# Patient Record
Sex: Male | Born: 1950
Health system: Southern US, Community
[De-identification: ages and names within clinical notes are randomized; demographics above are authoritative.]

## PROBLEM LIST (undated history)

## (undated) DIAGNOSIS — I639 Cerebral infarction, unspecified: Secondary | ICD-10-CM

## (undated) DIAGNOSIS — E785 Hyperlipidemia, unspecified: Secondary | ICD-10-CM

## (undated) DIAGNOSIS — R7401 Elevation of levels of liver transaminase levels: Secondary | ICD-10-CM

## (undated) DIAGNOSIS — R011 Cardiac murmur, unspecified: Secondary | ICD-10-CM

## (undated) DIAGNOSIS — R0600 Dyspnea, unspecified: Secondary | ICD-10-CM

## (undated) DIAGNOSIS — D229 Melanocytic nevi, unspecified: Secondary | ICD-10-CM

## (undated) DIAGNOSIS — I10 Essential (primary) hypertension: Secondary | ICD-10-CM

## (undated) DIAGNOSIS — M1710 Unilateral primary osteoarthritis, unspecified knee: Secondary | ICD-10-CM

## (undated) DIAGNOSIS — I7774 Dissection of vertebral artery: Secondary | ICD-10-CM

## (undated) DIAGNOSIS — F329 Major depressive disorder, single episode, unspecified: Secondary | ICD-10-CM

## (undated) DIAGNOSIS — M179 Osteoarthritis of knee, unspecified: Secondary | ICD-10-CM

## (undated) DIAGNOSIS — N200 Calculus of kidney: Secondary | ICD-10-CM

## (undated) DIAGNOSIS — F32A Depression, unspecified: Secondary | ICD-10-CM

## (undated) DIAGNOSIS — G4730 Sleep apnea, unspecified: Secondary | ICD-10-CM

## (undated) DIAGNOSIS — I4891 Unspecified atrial fibrillation: Secondary | ICD-10-CM

## (undated) DIAGNOSIS — I350 Nonrheumatic aortic (valve) stenosis: Secondary | ICD-10-CM

## (undated) DIAGNOSIS — R740 Nonspecific elevation of levels of transaminase and lactic acid dehydrogenase [LDH]: Secondary | ICD-10-CM

## (undated) DIAGNOSIS — I5031 Acute diastolic (congestive) heart failure: Secondary | ICD-10-CM

## (undated) DIAGNOSIS — Z87442 Personal history of urinary calculi: Secondary | ICD-10-CM

## (undated) DIAGNOSIS — K7460 Unspecified cirrhosis of liver: Secondary | ICD-10-CM

## (undated) DIAGNOSIS — N179 Acute kidney failure, unspecified: Secondary | ICD-10-CM

## (undated) DIAGNOSIS — K219 Gastro-esophageal reflux disease without esophagitis: Secondary | ICD-10-CM

## (undated) DIAGNOSIS — I2510 Atherosclerotic heart disease of native coronary artery without angina pectoris: Secondary | ICD-10-CM

## (undated) HISTORY — DX: Sleep apnea, unspecified: G47.30

## (undated) HISTORY — DX: Dissection of vertebral artery: I77.74

## (undated) HISTORY — DX: Nonspecific elevation of levels of transaminase and lactic acid dehydrogenase (ldh): R74.0

## (undated) HISTORY — DX: Osteoarthritis of knee, unspecified: M17.9

## (undated) HISTORY — DX: Gastro-esophageal reflux disease without esophagitis: K21.9

## (undated) HISTORY — DX: Melanocytic nevi, unspecified: D22.9

## (undated) HISTORY — DX: Essential (primary) hypertension: I10

## (undated) HISTORY — DX: Elevation of levels of liver transaminase levels: R74.01

## (undated) HISTORY — DX: Unilateral primary osteoarthritis, unspecified knee: M17.10

## (undated) HISTORY — DX: Hyperlipidemia, unspecified: E78.5

## (undated) HISTORY — DX: Unspecified cirrhosis of liver: K74.60

## (undated) HISTORY — DX: Acute kidney failure, unspecified: N17.9

## (undated) HISTORY — DX: Calculus of kidney: N20.0

---

## 2003-03-18 ENCOUNTER — Emergency Department (HOSPITAL_COMMUNITY): Admission: EM | Admit: 2003-03-18 | Discharge: 2003-03-18 | Payer: Self-pay | Admitting: *Deleted

## 2005-01-16 ENCOUNTER — Ambulatory Visit: Payer: Self-pay | Admitting: Internal Medicine

## 2005-01-17 ENCOUNTER — Ambulatory Visit (HOSPITAL_COMMUNITY): Admission: RE | Admit: 2005-01-17 | Discharge: 2005-01-17 | Payer: Self-pay | Admitting: Internal Medicine

## 2005-03-13 DIAGNOSIS — I639 Cerebral infarction, unspecified: Secondary | ICD-10-CM

## 2005-03-13 DIAGNOSIS — I7774 Dissection of vertebral artery: Secondary | ICD-10-CM

## 2005-03-13 HISTORY — DX: Cerebral infarction, unspecified: I63.9

## 2005-03-13 HISTORY — DX: Dissection of vertebral artery: I77.74

## 2005-05-12 ENCOUNTER — Ambulatory Visit (HOSPITAL_BASED_OUTPATIENT_CLINIC_OR_DEPARTMENT_OTHER): Admission: RE | Admit: 2005-05-12 | Discharge: 2005-05-12 | Payer: Self-pay | Admitting: Orthopedic Surgery

## 2005-05-12 HISTORY — PX: KNEE ARTHROSCOPY W/ PARTIAL MEDIAL MENISCECTOMY: SHX1882

## 2005-08-02 DIAGNOSIS — I7774 Dissection of vertebral artery: Secondary | ICD-10-CM | POA: Insufficient documentation

## 2005-12-27 ENCOUNTER — Inpatient Hospital Stay (HOSPITAL_COMMUNITY): Admission: EM | Admit: 2005-12-27 | Discharge: 2006-01-03 | Payer: Self-pay | Admitting: Emergency Medicine

## 2005-12-28 ENCOUNTER — Encounter (INDEPENDENT_AMBULATORY_CARE_PROVIDER_SITE_OTHER): Payer: Self-pay | Admitting: Neurology

## 2005-12-28 ENCOUNTER — Encounter (INDEPENDENT_AMBULATORY_CARE_PROVIDER_SITE_OTHER): Payer: Self-pay | Admitting: *Deleted

## 2005-12-28 ENCOUNTER — Ambulatory Visit: Payer: Self-pay | Admitting: Physical Medicine & Rehabilitation

## 2006-01-03 ENCOUNTER — Inpatient Hospital Stay (HOSPITAL_COMMUNITY)
Admission: RE | Admit: 2006-01-03 | Discharge: 2006-01-24 | Payer: Self-pay | Admitting: Physical Medicine & Rehabilitation

## 2006-01-10 ENCOUNTER — Ambulatory Visit: Payer: Self-pay | Admitting: Internal Medicine

## 2006-01-29 ENCOUNTER — Ambulatory Visit: Payer: Self-pay | Admitting: Hospitalist

## 2006-01-29 ENCOUNTER — Encounter (INDEPENDENT_AMBULATORY_CARE_PROVIDER_SITE_OTHER): Payer: Self-pay | Admitting: Internal Medicine

## 2006-01-29 LAB — CONVERTED CEMR LAB
BUN: 10 mg/dL (ref 6–23)
Bilirubin Urine: NEGATIVE
CO2: 21 meq/L (ref 19–32)
Creatinine, Ser: 1.04 mg/dL (ref 0.40–1.50)
Creatinine, Urine: 159.6 mg/dL
Glucose, Bld: 102 mg/dL — ABNORMAL HIGH (ref 70–99)
Microalb Creat Ratio: 5.1 mg/g (ref 0.0–30.0)
Microalb, Ur: 0.81 mg/dL (ref 0.00–1.89)
Protein, ur: NEGATIVE mg/dL
Total Bilirubin: 0.2 mg/dL — ABNORMAL LOW (ref 0.3–1.2)
Total Protein: 6.9 g/dL (ref 6.0–8.3)
Urobilinogen, UA: 0.2 (ref 0.0–1.0)

## 2006-01-30 ENCOUNTER — Encounter (INDEPENDENT_AMBULATORY_CARE_PROVIDER_SITE_OTHER): Payer: Self-pay | Admitting: Internal Medicine

## 2006-02-05 ENCOUNTER — Encounter
Admission: RE | Admit: 2006-02-05 | Discharge: 2006-05-06 | Payer: Self-pay | Admitting: Physical Medicine & Rehabilitation

## 2006-02-05 ENCOUNTER — Ambulatory Visit: Payer: Self-pay | Admitting: Hospitalist

## 2006-02-05 ENCOUNTER — Ambulatory Visit: Payer: Self-pay | Admitting: Physical Medicine & Rehabilitation

## 2006-02-14 ENCOUNTER — Ambulatory Visit (HOSPITAL_COMMUNITY)
Admission: RE | Admit: 2006-02-14 | Discharge: 2006-02-14 | Payer: Self-pay | Admitting: Physical Medicine & Rehabilitation

## 2006-02-15 ENCOUNTER — Ambulatory Visit: Payer: Self-pay | Admitting: Internal Medicine

## 2006-02-21 ENCOUNTER — Emergency Department (HOSPITAL_COMMUNITY): Admission: EM | Admit: 2006-02-21 | Discharge: 2006-02-21 | Payer: Self-pay | Admitting: Family Medicine

## 2006-03-08 ENCOUNTER — Ambulatory Visit: Payer: Self-pay | Admitting: Internal Medicine

## 2006-03-08 ENCOUNTER — Ambulatory Visit (HOSPITAL_COMMUNITY): Admission: RE | Admit: 2006-03-08 | Discharge: 2006-03-08 | Payer: Self-pay | Admitting: Internal Medicine

## 2006-03-14 ENCOUNTER — Encounter
Admission: RE | Admit: 2006-03-14 | Discharge: 2006-06-12 | Payer: Self-pay | Admitting: Physical Medicine & Rehabilitation

## 2006-03-26 ENCOUNTER — Ambulatory Visit: Payer: Self-pay | Admitting: Physical Medicine & Rehabilitation

## 2006-04-26 ENCOUNTER — Ambulatory Visit: Payer: Self-pay | Admitting: Hospitalist

## 2006-04-26 DIAGNOSIS — K219 Gastro-esophageal reflux disease without esophagitis: Secondary | ICD-10-CM | POA: Insufficient documentation

## 2006-05-18 ENCOUNTER — Telehealth: Payer: Self-pay | Admitting: *Deleted

## 2006-05-18 ENCOUNTER — Ambulatory Visit: Admission: RE | Admit: 2006-05-18 | Discharge: 2006-05-18 | Payer: Self-pay | Admitting: Internal Medicine

## 2006-05-18 ENCOUNTER — Encounter (INDEPENDENT_AMBULATORY_CARE_PROVIDER_SITE_OTHER): Payer: Self-pay | Admitting: Internal Medicine

## 2006-05-18 ENCOUNTER — Ambulatory Visit: Payer: Self-pay | Admitting: Internal Medicine

## 2006-05-18 DIAGNOSIS — I119 Hypertensive heart disease without heart failure: Secondary | ICD-10-CM

## 2006-05-18 DIAGNOSIS — E785 Hyperlipidemia, unspecified: Secondary | ICD-10-CM | POA: Insufficient documentation

## 2006-05-18 DIAGNOSIS — K5900 Constipation, unspecified: Secondary | ICD-10-CM | POA: Insufficient documentation

## 2006-05-20 ENCOUNTER — Ambulatory Visit: Payer: Self-pay | Admitting: Cardiology

## 2006-05-30 ENCOUNTER — Ambulatory Visit (HOSPITAL_COMMUNITY): Admission: RE | Admit: 2006-05-30 | Discharge: 2006-05-30 | Payer: Self-pay | Admitting: Internal Medicine

## 2006-05-30 ENCOUNTER — Encounter: Payer: Self-pay | Admitting: Cardiology

## 2006-07-11 ENCOUNTER — Ambulatory Visit: Payer: Self-pay | Admitting: *Deleted

## 2006-07-11 ENCOUNTER — Encounter (INDEPENDENT_AMBULATORY_CARE_PROVIDER_SITE_OTHER): Payer: Self-pay | Admitting: *Deleted

## 2006-07-11 DIAGNOSIS — IMO0002 Reserved for concepts with insufficient information to code with codable children: Secondary | ICD-10-CM

## 2006-07-11 DIAGNOSIS — M25469 Effusion, unspecified knee: Secondary | ICD-10-CM

## 2006-07-13 ENCOUNTER — Telehealth: Payer: Self-pay | Admitting: *Deleted

## 2006-08-07 ENCOUNTER — Ambulatory Visit: Payer: Self-pay | Admitting: Internal Medicine

## 2006-08-08 ENCOUNTER — Encounter (INDEPENDENT_AMBULATORY_CARE_PROVIDER_SITE_OTHER): Payer: Self-pay | Admitting: Internal Medicine

## 2006-12-03 ENCOUNTER — Telehealth: Payer: Self-pay | Admitting: *Deleted

## 2006-12-27 ENCOUNTER — Ambulatory Visit: Payer: Self-pay | Admitting: Internal Medicine

## 2006-12-27 ENCOUNTER — Encounter (INDEPENDENT_AMBULATORY_CARE_PROVIDER_SITE_OTHER): Payer: Self-pay | Admitting: Internal Medicine

## 2006-12-27 LAB — CONVERTED CEMR LAB
FSH: 3.4 milliintl units/mL (ref 1.4–18.1)
LH: 2.3 milliintl units/mL (ref 1.5–9.3)

## 2006-12-31 ENCOUNTER — Inpatient Hospital Stay (HOSPITAL_COMMUNITY): Admission: EM | Admit: 2006-12-31 | Discharge: 2007-01-01 | Payer: Self-pay | Admitting: Emergency Medicine

## 2006-12-31 DIAGNOSIS — K7689 Other specified diseases of liver: Secondary | ICD-10-CM

## 2006-12-31 LAB — CONVERTED CEMR LAB
ALT: 75 units/L — ABNORMAL HIGH (ref 0–53)
AST: 49 units/L — ABNORMAL HIGH (ref 0–37)
Alkaline Phosphatase: 45 units/L (ref 39–117)
Basophils Absolute: 0.1 10*3/uL (ref 0.0–0.1)
Basophils Relative: 1 % (ref 0–1)
Calcium: 9 mg/dL (ref 8.4–10.5)
Chloride: 104 meq/L (ref 96–112)
Creatinine, Ser: 1.13 mg/dL (ref 0.40–1.50)
Creatinine, Urine: 190.8 mg/dL
Eosinophils Absolute: 0.3 10*3/uL (ref 0.0–0.7)
MCHC: 33.8 g/dL (ref 30.0–36.0)
Microalb, Ur: 0.41 mg/dL (ref 0.00–1.89)
Monocytes Absolute: 0.6 10*3/uL (ref 0.2–0.7)
Neutro Abs: 4.7 10*3/uL (ref 1.7–7.7)
Neutrophils Relative %: 59 % (ref 43–77)
Potassium: 3.8 meq/L (ref 3.5–5.3)
RDW: 12.8 % (ref 11.5–14.0)

## 2007-01-04 ENCOUNTER — Encounter: Admission: RE | Admit: 2007-01-04 | Discharge: 2007-01-04 | Payer: Self-pay | Admitting: Internal Medicine

## 2007-03-25 ENCOUNTER — Telehealth (INDEPENDENT_AMBULATORY_CARE_PROVIDER_SITE_OTHER): Payer: Self-pay | Admitting: Internal Medicine

## 2007-05-27 ENCOUNTER — Ambulatory Visit: Payer: Self-pay | Admitting: Hospitalist

## 2007-08-27 ENCOUNTER — Ambulatory Visit: Payer: Self-pay | Admitting: Internal Medicine

## 2007-08-27 LAB — CONVERTED CEMR LAB
ALT: 89 units/L — ABNORMAL HIGH (ref 0–53)
BUN: 15 mg/dL (ref 6–23)
CO2: 25 meq/L (ref 19–32)
Creatinine, Ser: 0.89 mg/dL (ref 0.40–1.50)
Glucose, Bld: 86 mg/dL (ref 70–99)
Total Bilirubin: 0.5 mg/dL (ref 0.3–1.2)

## 2007-09-03 ENCOUNTER — Encounter (INDEPENDENT_AMBULATORY_CARE_PROVIDER_SITE_OTHER): Payer: Self-pay | Admitting: Internal Medicine

## 2007-09-19 ENCOUNTER — Encounter (INDEPENDENT_AMBULATORY_CARE_PROVIDER_SITE_OTHER): Payer: Self-pay | Admitting: Internal Medicine

## 2007-09-19 ENCOUNTER — Ambulatory Visit: Payer: Self-pay | Admitting: Infectious Diseases

## 2007-09-19 ENCOUNTER — Ambulatory Visit (HOSPITAL_COMMUNITY): Admission: RE | Admit: 2007-09-19 | Discharge: 2007-09-19 | Payer: Self-pay | Admitting: *Deleted

## 2007-09-19 LAB — CONVERTED CEMR LAB: Hgb A1c MFr Bld: 6.6 %

## 2007-09-25 LAB — CONVERTED CEMR LAB
Albumin: 4.4 g/dL (ref 3.5–5.2)
BUN: 17 mg/dL (ref 6–23)
CO2: 23 meq/L (ref 19–32)
Calcium: 9.3 mg/dL (ref 8.4–10.5)
Chloride: 101 meq/L (ref 96–112)
Cholesterol: 281 mg/dL — ABNORMAL HIGH (ref 0–200)
Creatinine, Ser: 0.97 mg/dL (ref 0.40–1.50)
Glucose, Bld: 110 mg/dL — ABNORMAL HIGH (ref 70–99)
HCT: 42.4 % (ref 39.0–52.0)
HDL: 43 mg/dL (ref >39)
Hemoglobin: 14.2 g/dL (ref 13.0–17.0)
Potassium: 4.4 meq/L (ref 3.5–5.3)
RBC: 4.89 M/uL (ref 4.22–5.81)
Total CHOL/HDL Ratio: 6.5
WBC: 7.5 10*3/uL (ref 4.0–10.5)

## 2007-09-30 ENCOUNTER — Telehealth: Payer: Self-pay | Admitting: Licensed Clinical Social Worker

## 2007-09-30 ENCOUNTER — Encounter: Payer: Self-pay | Admitting: Licensed Clinical Social Worker

## 2007-10-17 ENCOUNTER — Ambulatory Visit: Payer: Self-pay | Admitting: *Deleted

## 2007-10-18 ENCOUNTER — Telehealth (INDEPENDENT_AMBULATORY_CARE_PROVIDER_SITE_OTHER): Payer: Self-pay | Admitting: Internal Medicine

## 2007-10-18 ENCOUNTER — Encounter (INDEPENDENT_AMBULATORY_CARE_PROVIDER_SITE_OTHER): Payer: Self-pay | Admitting: Internal Medicine

## 2007-10-18 DIAGNOSIS — E1149 Type 2 diabetes mellitus with other diabetic neurological complication: Secondary | ICD-10-CM | POA: Insufficient documentation

## 2007-10-24 ENCOUNTER — Telehealth: Payer: Self-pay | Admitting: *Deleted

## 2007-11-08 ENCOUNTER — Ambulatory Visit: Payer: Self-pay | Admitting: Internal Medicine

## 2007-11-08 ENCOUNTER — Encounter (INDEPENDENT_AMBULATORY_CARE_PROVIDER_SITE_OTHER): Payer: Self-pay | Admitting: Internal Medicine

## 2007-11-11 LAB — CONVERTED CEMR LAB
ALT: 77 units/L — ABNORMAL HIGH (ref 0–53)
BUN: 15 mg/dL (ref 6–23)
Basophils Absolute: 0.1 10*3/uL (ref 0.0–0.1)
Basophils Relative: 1 % (ref 0–1)
CO2: 24 meq/L (ref 19–32)
Calcium: 9.5 mg/dL (ref 8.4–10.5)
Chloride: 106 meq/L (ref 96–112)
Creatinine, Ser: 0.95 mg/dL (ref 0.40–1.50)
Eosinophils Relative: 3 % (ref 0–5)
HCT: 41.1 % (ref 39.0–52.0)
HCV Ab: NEGATIVE
Hemoglobin: 13.6 g/dL (ref 13.0–17.0)
Hep A Total Ab: NEGATIVE
Hepatitis B Surface Ag: NEGATIVE
Lymphocytes Relative: 32 % (ref 12–46)
MCHC: 33.1 g/dL (ref 30.0–36.0)
Monocytes Absolute: 0.6 10*3/uL (ref 0.1–1.0)
Monocytes Relative: 9 % (ref 3–12)
Neutro Abs: 3.8 10*3/uL (ref 1.7–7.7)
RBC: 4.72 M/uL (ref 4.22–5.81)
RDW: 13.1 % (ref 11.5–15.5)
Total Bilirubin: 0.5 mg/dL (ref 0.3–1.2)

## 2008-06-11 ENCOUNTER — Encounter (INDEPENDENT_AMBULATORY_CARE_PROVIDER_SITE_OTHER): Payer: Self-pay | Admitting: Internal Medicine

## 2008-06-17 ENCOUNTER — Telehealth: Payer: Self-pay | Admitting: *Deleted

## 2008-06-18 ENCOUNTER — Encounter (INDEPENDENT_AMBULATORY_CARE_PROVIDER_SITE_OTHER): Payer: Self-pay | Admitting: Internal Medicine

## 2008-06-18 ENCOUNTER — Ambulatory Visit (HOSPITAL_COMMUNITY): Admission: RE | Admit: 2008-06-18 | Discharge: 2008-06-18 | Payer: Self-pay | Admitting: Internal Medicine

## 2008-06-18 ENCOUNTER — Ambulatory Visit: Payer: Self-pay | Admitting: Internal Medicine

## 2008-06-18 LAB — CONVERTED CEMR LAB
ALT: 55 units/L — ABNORMAL HIGH (ref 0–53)
AST: 39 units/L — ABNORMAL HIGH (ref 0–37)
Alkaline Phosphatase: 70 units/L (ref 39–117)
Basophils Relative: 1 % (ref 0–1)
Creatinine, Ser: 1.02 mg/dL (ref 0.40–1.50)
Eosinophils Relative: 2 % (ref 0–5)
GFR calc Af Amer: >60 mL/min (ref >60)
HCT: 41 % (ref 39.0–52.0)
HDL: 48 mg/dL (ref >39)
Hemoglobin: 14.4 g/dL (ref 13.0–17.0)
MCHC: 35 g/dL (ref 30.0–36.0)
MCV: 85.4 fL (ref 78.0–100.0)
Monocytes Absolute: 1.4 10*3/uL — ABNORMAL HIGH (ref 0.1–1.0)
Monocytes Relative: 15 % — ABNORMAL HIGH (ref 3–12)
Neutro Abs: 6.1 10*3/uL (ref 1.7–7.7)
RBC: 4.8 M/uL (ref 4.22–5.81)
RDW: 13.1 % (ref 11.5–15.5)
Sed Rate: 32 mm/hr — ABNORMAL HIGH (ref 0–16)
Sodium: 137 meq/L (ref 135–145)
TSH: 3.157 microintl units/mL (ref 0.350–4.500)
Total Bilirubin: 0.6 mg/dL (ref 0.3–1.2)
Total CHOL/HDL Ratio: 6
Total Protein: 7.3 g/dL (ref 6.0–8.3)
VLDL: 42 mg/dL — ABNORMAL HIGH (ref 0–40)

## 2008-06-19 ENCOUNTER — Ambulatory Visit: Payer: Self-pay | Admitting: Internal Medicine

## 2008-06-19 ENCOUNTER — Encounter (INDEPENDENT_AMBULATORY_CARE_PROVIDER_SITE_OTHER): Payer: Self-pay | Admitting: Internal Medicine

## 2008-06-29 ENCOUNTER — Ambulatory Visit (HOSPITAL_BASED_OUTPATIENT_CLINIC_OR_DEPARTMENT_OTHER): Admission: RE | Admit: 2008-06-29 | Discharge: 2008-06-29 | Payer: Self-pay | Admitting: Internal Medicine

## 2008-06-29 ENCOUNTER — Encounter (INDEPENDENT_AMBULATORY_CARE_PROVIDER_SITE_OTHER): Payer: Self-pay | Admitting: Internal Medicine

## 2008-07-04 ENCOUNTER — Ambulatory Visit: Payer: Self-pay | Admitting: Internal Medicine

## 2008-07-16 ENCOUNTER — Encounter (INDEPENDENT_AMBULATORY_CARE_PROVIDER_SITE_OTHER): Payer: Self-pay | Admitting: Internal Medicine

## 2008-07-16 ENCOUNTER — Ambulatory Visit: Payer: Self-pay | Admitting: Internal Medicine

## 2008-07-16 LAB — CONVERTED CEMR LAB
Albumin: 4 g/dL (ref 3.5–5.2)
BUN: 11 mg/dL (ref 6–23)
Basophils Absolute: 0.1 10*3/uL (ref 0.0–0.1)
CO2: 28 meq/L (ref 19–32)
GFR calc Af Amer: >60 mL/min (ref >60)
GFR calc non Af Amer: >60 mL/min (ref >60)
Glucose, Bld: 135 mg/dL — ABNORMAL HIGH (ref 70–99)
INR: 0.9 (ref 0.0–1.5)
Lymphocytes Relative: 34 % (ref 12–46)
Neutro Abs: 3.4 10*3/uL (ref 1.7–7.7)
Platelets: 230 10*3/uL (ref 150–400)
Potassium: 4.2 meq/L (ref 3.5–5.3)
Prothrombin Time: 12.7 s (ref 11.6–15.2)
RDW: 12.8 % (ref 11.5–15.5)
Sodium: 137 meq/L (ref 135–145)
Total Bilirubin: 0.9 mg/dL (ref 0.3–1.2)
Total Protein: 7.5 g/dL (ref 6.0–8.3)

## 2008-07-31 ENCOUNTER — Ambulatory Visit (HOSPITAL_COMMUNITY): Admission: RE | Admit: 2008-07-31 | Discharge: 2008-07-31 | Payer: Self-pay | Admitting: Internal Medicine

## 2008-08-03 ENCOUNTER — Encounter (INDEPENDENT_AMBULATORY_CARE_PROVIDER_SITE_OTHER): Payer: Self-pay | Admitting: Internal Medicine

## 2009-07-24 ENCOUNTER — Encounter: Payer: Self-pay | Admitting: Internal Medicine

## 2009-07-24 ENCOUNTER — Encounter (INDEPENDENT_AMBULATORY_CARE_PROVIDER_SITE_OTHER): Payer: Self-pay | Admitting: Internal Medicine

## 2009-07-24 ENCOUNTER — Emergency Department (HOSPITAL_COMMUNITY)
Admission: EM | Admit: 2009-07-24 | Discharge: 2009-07-24 | Payer: Self-pay | Source: Home / Self Care | Admitting: Family Medicine

## 2009-07-24 ENCOUNTER — Emergency Department (HOSPITAL_COMMUNITY): Admission: EM | Admit: 2009-07-24 | Discharge: 2009-07-24 | Payer: Self-pay | Admitting: Emergency Medicine

## 2009-08-02 ENCOUNTER — Ambulatory Visit: Payer: Self-pay | Admitting: Internal Medicine

## 2009-08-02 DIAGNOSIS — G4730 Sleep apnea, unspecified: Secondary | ICD-10-CM

## 2009-08-02 DIAGNOSIS — G9050 Complex regional pain syndrome I, unspecified: Secondary | ICD-10-CM | POA: Insufficient documentation

## 2009-08-02 LAB — CONVERTED CEMR LAB

## 2009-08-03 LAB — CONVERTED CEMR LAB
Alkaline Phosphatase: 57 units/L (ref 39–117)
BUN: 17 mg/dL (ref 6–23)
Creatinine, Urine: 145.3 mg/dL
Glucose, Bld: 139 mg/dL — ABNORMAL HIGH (ref 70–99)
HDL: 44 mg/dL (ref >39)
LDL Cholesterol: 182 mg/dL — ABNORMAL HIGH (ref 0–99)
Microalb, Ur: 0.99 mg/dL (ref 0.00–1.89)
Sodium: 137 meq/L (ref 135–145)
Total Bilirubin: 0.7 mg/dL (ref 0.3–1.2)
Total Protein: 7.7 g/dL (ref 6.0–8.3)
Triglycerides: 202 mg/dL — ABNORMAL HIGH (ref <150)
VLDL: 40 mg/dL (ref 0–40)

## 2009-08-23 ENCOUNTER — Ambulatory Visit: Payer: Self-pay | Admitting: Internal Medicine

## 2009-08-25 ENCOUNTER — Ambulatory Visit (HOSPITAL_BASED_OUTPATIENT_CLINIC_OR_DEPARTMENT_OTHER): Admission: RE | Admit: 2009-08-25 | Discharge: 2009-08-25 | Payer: Self-pay | Admitting: Internal Medicine

## 2009-08-25 ENCOUNTER — Encounter: Payer: Self-pay | Admitting: Internal Medicine

## 2009-08-31 ENCOUNTER — Ambulatory Visit: Payer: Self-pay | Admitting: Internal Medicine

## 2009-08-31 LAB — CONVERTED CEMR LAB
AST: 55 units/L — ABNORMAL HIGH (ref 0–37)
Alkaline Phosphatase: 52 units/L (ref 39–117)
BUN: 13 mg/dL (ref 6–23)
Creatinine, Ser: 0.94 mg/dL (ref 0.40–1.50)

## 2009-09-14 ENCOUNTER — Telehealth (INDEPENDENT_AMBULATORY_CARE_PROVIDER_SITE_OTHER): Payer: Self-pay | Admitting: *Deleted

## 2009-09-16 ENCOUNTER — Encounter: Payer: Self-pay | Admitting: Internal Medicine

## 2009-09-21 ENCOUNTER — Encounter: Payer: Self-pay | Admitting: Internal Medicine

## 2009-10-01 ENCOUNTER — Encounter: Payer: Self-pay | Admitting: Internal Medicine

## 2009-10-04 ENCOUNTER — Telehealth: Payer: Self-pay | Admitting: Internal Medicine

## 2009-10-19 ENCOUNTER — Ambulatory Visit: Payer: Self-pay | Admitting: Physical Medicine & Rehabilitation

## 2009-10-19 ENCOUNTER — Encounter
Admission: RE | Admit: 2009-10-19 | Discharge: 2009-10-19 | Payer: Self-pay | Admitting: Physical Medicine & Rehabilitation

## 2009-11-29 ENCOUNTER — Encounter: Payer: Self-pay | Admitting: Internal Medicine

## 2010-03-04 ENCOUNTER — Emergency Department (HOSPITAL_COMMUNITY)
Admission: EM | Admit: 2010-03-04 | Discharge: 2010-03-04 | Payer: Self-pay | Source: Home / Self Care | Admitting: Emergency Medicine

## 2010-04-02 ENCOUNTER — Encounter: Payer: Self-pay | Admitting: Physical Medicine & Rehabilitation

## 2010-04-03 ENCOUNTER — Encounter: Payer: Self-pay | Admitting: Internal Medicine

## 2010-04-03 ENCOUNTER — Encounter: Payer: Self-pay | Admitting: Physical Medicine & Rehabilitation

## 2010-04-04 ENCOUNTER — Encounter: Payer: Self-pay | Admitting: Internal Medicine

## 2010-04-10 LAB — CONVERTED CEMR LAB
Albumin: 3.9 g/dL (ref 3.5–5.2)
CO2: 27 meq/L (ref 19–32)
Glucose, Bld: 107 mg/dL — ABNORMAL HIGH (ref 70–99)
Hemoglobin, Urine: NEGATIVE
Leukocytes, UA: NEGATIVE
Nitrite: NEGATIVE
Potassium: 4.2 meq/L (ref 3.5–5.3)
Protein, ur: NEGATIVE mg/dL
Sodium: 137 meq/L (ref 135–145)
TSH: 0.649 microintl units/mL (ref 0.350–5.50)
Total Bilirubin: 0.7 mg/dL (ref 0.3–1.2)
Total Protein: 6.7 g/dL (ref 6.0–8.3)
Urobilinogen, UA: 0.2 (ref 0.0–1.0)

## 2010-04-12 NOTE — Assessment & Plan Note (Signed)
Summary: [name] 2 WEEK RECHECK PER BP PER GOLDING/CH   Vital Signs:  Patient profile:   60 year old male Height:      68 inches (172.72 cm) Weight:      250.0 pounds (113.64 kg) BMI:     38.15 Temp:     97.0 degrees F (36.11 degrees C) oral Pulse rate:   81 / minute BP sitting:   146 / 89  (right arm) Cuff size:   large  Vitals Entered By: Cynda Familia Duncan Dull) (August 23, 2009 2:13 PM) CC: 2wk reck Is Patient Diabetic? No Pain Assessment Patient in pain? no      Nutritional Status BMI of > 30 = obese  Have you ever been in a relationship where you felt threatened, hurt or afraid?No   Does patient need assistance? Functional Status Self care Ambulation Normal   Primary Care Provider:  Lars Mage MD  CC:  2wk reck.  History of Present Illness: 60 yo man with PMH as described in EMR is here today for a regular follow up. I am his PCP and seeing him for the first time.  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 He also c/o balance problems. He said that he quit taking pills 9 months ago as he didnt see nay benefitof continuing them and was admittedto Teaching service last month for new onset dizzyness. He was discharged home without any new stroke. His BP is 146/89 and he is just on HCTZ, not taking Norvasc he wants to be on combination pill.  Hyperlipidemia is controlled by Gemfibrosil and we talked about Crestor and since he is medicaid I will start crestor 10.  Diabetes is wellcontrolled with latest Hba1c 7.0. I will sent himfor a nutritio referral, opthalmology referral.  His vaccination is UTD. I will refer himforcolonoscopy.  No other complaints.   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01) 17)  Sob  (ICD-786.05) 18)  Symptom, Edema  (ICD-782.3) 19)  Constipation Nos  (ICD-564.00) 20)  Dm  (ICD-250.00) 21)  Hyperlipidemia  (ICD-272.4) 22)  Benign Prostatic Hypertrophy, Hx of  (ICD-V13.8) 23)  Gastroesophageal Reflux Disease  (ICD-530.81)  Medications Prior to Update: 1)  Aspirin 325 Mg Tabs (Aspirin) .... Take 1 Tablet By Mouth Once A Day 2)  Hydrochlorothiazide 25 Mg  Tabs (Hydrochlorothiazide) .... Take 1 Tablet By Mouth Once A Day 3)  Gemfibrozil 600 Mg Tabs (Gemfibrozil) .... Take One Tablet 30 Minutes Before Breakfast and Dinner. 4)  Metformin Hcl 500 Mg Xr24h-Tab (Metformin Hcl) .... Take 1 Tablet By Mouth Once With Breakfast 5)  Meclizine Hcl 25 Mg Tabs (  Meclizine Hcl) .... Take 1-2 Tabs Every 8 Hours As Needed. 6)  Amlodipine Besylate 5 Mg Tabs (Amlodipine Besylate) .... Take 1 Tablet By Mouth Once A Day  Current Medications (verified): 1)  Aspirin 325 Mg Tabs (Aspirin) .... Take 1 Tablet By Mouth Once A Day 2)  Gemfibrozil 600 Mg Tabs (Gemfibrozil) .... Take One Tablet 30 Minutes Before Breakfast and Dinner. 3)  Metformin Hcl 1000 Mg Tabs (Metformin Hcl) .Marland Kitchen.. 1 Tab Two Times A Day 4)  Meclizine Hcl 25 Mg Tabs (Meclizine Hcl) .... Take 1-2 Tabs Every 8 Hours As Needed. 5)  Crestor 10 Mg Tabs (Rosuvastatin Calcium) .... Take 1 Tab By Mouth Once Daily. 6)   Enalapril-Hydrochlorothiazide 10-25 Mg Tabs (Enalapril-Hydrochlorothiazide) .... Take 1 Tab By Mouth Once Daily  Allergies: 1)  ! 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 Narrowing in the left      vertebral with severe diminution of flow or acute occlusion.      - 2D Echo 2007, normal, no embolic source Osteoarthritis knee Sleep Apnea/Central Apnea Atypical Nevi GERD Glucose Intolerance/DM2  Transaminitis/Statin induced Hypertention Hyperlipidemia Nephrolithiasis  Social History: Last updated: 08/02/2009 Single, but he has a girlfriend. Artist-Works odd jobs, and collects rare artifacts from landfills  He has medicaid/disbility post stroke  Risk Factors: Alcohol Use: 0 (08/23/2009) Exercise: yes (08/02/2009)  Risk Factors: Smoking Status: never (08/23/2009) Passive Smoke Exposure: no (08/23/2009)  Review of Systems      See HPI  Physical Exam  Additional Exam:  General:  alert and well-developed.   Lungs:  normal respiratory effort and normal breath sounds.   Heart:  normal rate, regular rhythm, and no murmur.   Abdomen:  soft, non-tender, normal bowel sounds, and hepatomegaly.   Msk:  normal ROM, no joint tenderness, and no joint swelling.   Swelling w/o redness, non-pitting edema and stiffness of teh Right UE. No hand deformity, limited ROM. Pulses:  R and L carotid,radial,femoral,dorsalis pedis and posterior tibial pulses are full and equal bilaterally Extremities:  No clubbing, cyanosis, edema, or deformity noted with normal full range of motion of all joints.   Neurologic:  alert & oriented X3, cranial nerves II-XII intact, and strength normal in all extremities. Decreased sensation right UE.       Impression & Recommendations:  Problem # 1:  HYPERTENSION, BENIGN ESSENTIAL, CONTROLLED (ICD-401.1) Assessment Unchanged  I will add combianation of HCTZ and enalapril combination. His crt 0.99 and K is 4.2. I will recheck it in 1 week and follow it up after starting ACEi's. His BP today is 146/89 on HCTZ and may benefit from addition of ACEi which will help him in Diabetesas well. The following medications were removed from the medication list:    Hydrochlorothiazide 25 Mg Tabs (Hydrochlorothiazide) .Marland Kitchen... Take 1 tablet by mouth once a day    Amlodipine Besylate 5 Mg Tabs (Amlodipine besylate) .Marland Kitchen... Take 1 tablet by mouth once a day His updated medication list for this problem includes:    Enalapril-hydrochlorothiazide 10-25 Mg Tabs (Enalapril-hydrochlorothiazide) .Marland Kitchen... Take 1 tab by mouth once daily  Orders: T-CMP with Estimated GFR ([id number])  BP today: 146/89 Prior BP: 155/92 (08/02/2009)  Labs Reviewed: K+:  4.4 (08/02/2009) Creat: : 0.96 (08/02/2009)   Chol: 266 (08/02/2009)   HDL: 44 (08/02/2009)   LDL: 182 (08/02/2009)   TG: 202 (08/02/2009)  Problem # 2:  DM (ICD-250.00) Assessment: Unchanged Increase dose of Metformin to1000 two times a day  and appointment with Jamison Neighbor. Patient was counselled about dietery habits and importance of Glucose testing. He denied about checking his glucose at home at this time but this is something which can be done by Diabetes coordinator. Optha exam for annual check up. His updated medication list for this problem includes:    Aspirin 325 Mg Tabs (Aspirin) .Marland Kitchen... Take 1 tablet by mouth once a day    Metformin Hcl 1000 Mg Tabs (Metformin hcl) .Marland Kitchen... 1 tab two times a day    Enalapril-hydrochlorothiazide 10-25 Mg Tabs (Enalapril-hydrochlorothiazide) .Marland Kitchen... Take 1 tab by mouth once daily  Orders: Ophthalmology Referral (Ophthalmology) Diabetic Clinic Referral (Diabetic)  Labs Reviewed: Creat: 0.96 (08/02/2009)    Reviewed HgBA1c  results: 7.0 (08/02/2009)  7.2 (07/16/2008)  Problem # 3:  HYPERLIPIDEMIA (ICD-272.4) Assessment: Unchanged Patient was sterted on Crestor as his LDL is 182where it should be <100/<70 optional. His liver is sensitive to statins as he tried Pravachol in the past and had Transaminitis, I will follow it closely with Cmet in 1 week after starting Statin. His updated medication list for this problem includes:    Gemfibrozil 600 Mg Tabs (Gemfibrozil) .Marland Kitchen... Take one tablet 30 minutes before breakfast and dinner.    Crestor 10 Mg Tabs (Rosuvastatin calcium) .Marland Kitchen... Take 1 tab by mouth once daily.  Orders: T-CMP with Estimated GFR ([id number])  Labs Reviewed: SGOT: 33 (08/02/2009)   SGPT: 43 (08/02/2009)   HDL:44 (08/02/2009), 48 (06/18/2008)  LDL:182 (08/02/2009), 196 (06/18/2008)  Chol:266 (08/02/2009), 286 (06/18/2008)  Trig:202 (08/02/2009), 211 (06/18/2008)  Problem # 4:  PREVENTIVE HEALTH CARE (ICD-V70.0) Up to date on vaccination. Orders: Gastroenterology Referral (GI)  Problem # 5:  SLEEP APNEA (ICD-780.57) Assessment: Improved Patient's shortness of breath has actually improved significantly over last few years and he thinks that he doesnot need CpAP any more. Though he also mentioned that he has never used it in the past.  Problem # 6:  CEREBROVASCULAR ACCIDENT, HX OF (ICD-V12.50) Assessment: Unchanged Needs to control his DM, Lipds and HTN and [place] needs to be complaint with aspirin for secondary prevention of Stroke.  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  Complete Medication List: 1)  Aspirin 325 Mg Tabs (Aspirin) .... Take 1 tablet by mouth once a day 2)  Gemfibrozil 600 Mg Tabs (Gemfibrozil) .... Take one tablet 30 minutes before breakfast and dinner. 3)  Metformin Hcl 1000 Mg Tabs (Metformin hcl) .Marland Kitchen.. 1 tab two times a day 4)  Meclizine Hcl 25 Mg Tabs (Meclizine hcl) .... Take 1-2 tabs every 8 hours as needed. 5)   Crestor 10 Mg Tabs (Rosuvastatin calcium) .... Take 1 tab by mouth once daily. 6)  Enalapril-hydrochlorothiazide 10-25 Mg Tabs (Enalapril-hydrochlorothiazide) .... Take 1 tab by mouth once daily  Patient Instructions: 1)  Please come to clinic for a lab draw next week. 2)  Please schedule a follow-up appointment in 2 months. 3)  It is important that you exercise regularly at least 20 minutes 5 times a week. If you develop chest pain, have severe difficulty breathing, or feel very tired , stop exercising immediately and seek medical attention. 4)  You need to lose weight. Consider a lower calorie diet and regular exercise.  5)  Schedule a colonoscopy/sigmoidoscopy to help detect colon cancer. 6)  Take an Aspirin every day. 7)  Check your blood sugars regularly. If your readings are usually above : or below 70 you should contact our office. 8)  It is important that your Diabetic A1c level is checked every 3 months. 9)  See your eye doctor yearly to check for diabetic eye damage. 10)  Check your feet each night for sore areas, calluses or signs of infection. 11)  Check your Blood Pressure regularly. If it is above: you should make an appointment. Prescriptions: CRESTOR 10 MG TABS (ROSUVASTATIN CALCIUM) take 1 tab by mouth once daily.  #30 x 11   Entered and Authorized by:   Lars Mage MD   Signed by:   Lars Mage MD on 08/23/2009   Method used:   Print then Give to Patient   RxID:   [id number] METFORMIN HCL 1000 MG TABS (METFORMIN HCL) 1 tab two times a day  #60 x 11   Entered and Authorized by:   Lars Mage MD   Signed by:   Lars Mage MD on 08/23/2009   Method used:   Print then Give to Patient   RxID:   [id number] ENALAPRIL-HYDROCHLOROTHIAZIDE 10-25 MG TABS (ENALAPRIL-HYDROCHLOROTHIAZIDE) take 1 tab by mouth once daily  #30 x 11   Entered and Authorized by:   Lars Mage MD   Signed by:   Lars Mage MD on 08/23/2009   Method used:   Print then Give to Patient   RxID:    [id number] CRESTOR 10 MG TABS (ROSUVASTATIN CALCIUM) take 1 tab by mouth once daily.  #30 x 11   Entered and Authorized by:   Lars Mage MD   Signed by:   Lars Mage MD on 08/23/2009   Method used:   Electronically to        [facility]  [address] [id number] (retail)       [address]       [place], [place]  [address]       Ph: [phone]       Fax: [phone]   RxID:   [id number] METFORMIN HCL 1000 MG TABS (METFORMIN HCL) 1 tab two times a day  #60 x 11   Entered and Authorized by:   Lars Mage MD   Signed by:   Lars Mage MD on 08/23/2009   Method used:   [facility]ically to        [facility]  [address] [id number] (retail)       [address]       [place], [place]  [address]       Ph: [phone]       Fax: [phone]   RxID:   [id number]    Prevention & Chronic Care Immunizations   Influenza vaccine: Not documented   Influenza vaccine deferral: Deferred  (08/23/2009)   Influenza vaccine due: 11/11/2009    Tetanus booster: Not documented   Td booster deferral: Refused  (08/23/2009)   Tetanus booster due: 08/24/2019    Pneumococcal vaccine: Not documented   Pneumococcal vaccine deferral: Refused  (08/23/2009)   Pneumococcal vaccine due: 09/22/2013    H. zoster vaccine: Not documented   H. zoster vaccine deferral: Deferred  (08/23/2009)  Colorectal Screening   Hemoccult: Not documented   Hemoccult action/deferral: Not indicated  (08/23/2009)    Colonoscopy: Not documented  Colonoscopy action/deferral: GI referral  (08/23/2009)  Other Screening   PSA: Not documented   PSA action/deferral: Discussed-decision deferred  (08/02/2009)   Smoking status: never  (08/23/2009)  Diabetes Mellitus   HgbA1C: 7.0  (08/02/2009)    Eye exam: Not documented   Diabetic eye exam action/deferral: Ophthalmology referral  (08/23/2009)    Foot exam: Not documented   Foot exam action/deferral: Deferred   High risk foot: Not  documented   Foot care education: Not documented    Urine microalbumin/creatinine ratio: 6.8  (08/02/2009)   Urine microalbumin action/deferral: Ordered    Diabetes flowsheet reviewed?: Yes   Progress toward A1C goal: At goal  Lipids   Total Cholesterol: 266  (08/02/2009)   Lipid panel action/deferral: Lipid Panel ordered   LDL: 182  (08/02/2009)   LDL Direct: Not documented   HDL: 44  (08/02/2009)   Triglycerides: 202  (08/02/2009)    SGOT (AST): 33  (08/02/2009)   BMP action: Ordered   SGPT (ALT): 43  (08/02/2009)   Alkaline phosphatase: 57  (08/02/2009)   Total bilirubin: 0.7  (08/02/2009)    Lipid flowsheet reviewed?: Yes   Progress toward LDL goal: Unchanged  Hypertension   Last Blood Pressure: 146 / 89  (08/23/2009)   Serum creatinine: 0.96  (08/02/2009)   BMP action: Ordered   Serum potassium 4.4  (08/02/2009)    Hypertension flowsheet reviewed?: Yes   Progress toward BP goal: Unchanged  Self-Management Support :   Personal Goals (by the next clinic visit) :     Personal A1C goal: 7  (08/02/2009)     Personal blood pressure goal: 130/80  (08/02/2009)     Personal LDL goal: 100  (08/02/2009)    Patient will work on the following items until the next clinic visit to reach self-care goals:     Medications and monitoring: take my medicines every day, check my blood sugar, examine my feet every day  (08/23/2009)     Eating: eat foods that are low in salt, eat baked foods instead of fried foods  (08/23/2009)     Activity: take a 30 minute walk every day, take the stairs instead of the elevator  (08/02/2009)    Diabetes self-management support: Pre-printed educational material, Written self-care plan, Referred for medical nutrition therapy  (08/23/2009)   Diabetes care plan printed   Last diabetes self-management training by diabetes educator: 10/17/2007   Referred for diabetes self-mgmt training.   Last medical nutrition therapy: 10/17/2007    Hypertension  self-management support: Pre-printed educational material, Written self-care plan, Referred for medical nutrition therapy  (08/23/2009)   Hypertension self-care plan printed.    Lipid self-management support: Pre-printed educational material, Written self-care plan, Referred for medical nutrition therapy  (08/23/2009)   Lipid self-care plan printed.   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) Hx of DISSECTION OF VERTEBRAL ARTERY (ICD-443.24) DIZZINESS (ICD-780.4) OTHER CHRONIC NONALCOHOLIC LIVER DISEASE (ICD-571.8) PERIPHERAL NEUROPATHY (ICD-356.9) ORTHOPNEA (ICD-786.02) JOINT EFFUSION, RIGHT KNEE (ICD-719.06) MENISCUS TEAR, RIGHT (ICD-836.2) HYPERTENSION, BENIGN ESSENTIAL, CONTROLLED (ICD-401.1) CEREBROVASCULAR ACCIDENT, HX OF (ICD-V12.50) CONSTIPATION NOS (ICD-564.00) DM (ICD-250.00) HYPERLIPIDEMIA (ICD-272.4) GASTROESOPHAGEAL REFLUX DISEASE (ICD-530.81)     Management Training Needs:   Initial Diabetes Self management Training   Initial Medical Nutrition Therapy(3 hours/1st year)  Complicating  Conditions:  HTN  Dyslipidemia  Neuropathy  Appended Document: Orders Update    Clinical Lists Changes  Orders: Added new Test order of T-CMP with Estimated GFR ([id number]) - Signed      Process Orders Check Orders Results:     [facility]: ABN not required for this insurance Tests Sent for requisitioning (August 31, 2009 9:45 AM):     08/31/2009: [facility] -- T-CMP with Estimated GFR [[id number]] (signed)

## 2010-04-12 NOTE — Miscellaneous (Signed)
Summary: d/c from ED  Date of presentation and discharge from ED: 07/24/09  Mark Cabrera is a 60 yo M with PMH of CVA in 2007 with persistent dizziness/R sided hyperaesthesia since then, HTN, and DM who presents with increased dizziness and nausea since this morning. At baseline, he always c/o dizziness whenever he gets up or turns his head. However, this morning, his dizziness was worse than usual and he also c/o nausea. He got a little worried, so he called his friend who brought him to the ED. He says he had a "bunch of sugar last night" and wonders if this could have contributed. He has not been taking any of his medications, including his anti-hypertensives and diabetes meds, since last August because he didn't feel like they were helping his dizziness. By the time we saw him in the ED, he said he felt "puny" (like he was tired) but when he walked around the CDU he was able to ambulate normally and his dizziness was back to baseline. He wished to return home and was counseled very strongly on the importance of coming to clinic next week as he will have to have further evaluation of his dizziness and regular management of his chronic medical conditions. He agreed.  Physican exam:  Vitals: T 97.9  BP 157/97  P 73  R 18  O2 sat: 98% RA Ears: no erythema, TM intact CV: III/VI SEM Resp: CTAB Neuro: nonfocal, able to walk steadily through CDU (back to baseline) -exam otherwise unremarkable  All labs, EKG, and CT head were normal  Plan:  Will d/c home and ask for follow-up next week on his previous meds except for Lasix and Glipizide, added Antevert. See d/c instructions by Dr. Sherryll Burger for follow-up appt information.

## 2010-04-12 NOTE — Letter (Signed)
Summary: [facility] - CMN/PA &CMN TO MD  [facility] - CMN/PA &CMN TO MD   Imported By: Shon Hough 10/06/2009 14:24:44  _____________________________________________________________________  External Attachment:    Type:   Image     Comment:   External Document

## 2010-04-12 NOTE — Miscellaneous (Signed)
Summary: [facility]  [facility]   Imported By: Shon Hough 12/09/2009 15:05:49  _____________________________________________________________________  External Attachment:    Type:   Image     Comment:   External Document

## 2010-04-12 NOTE — Discharge Summary (Signed)
Summary: ED Discharge Update    Hospital Discharge Update:  Date of Admission: 07/24/2009 Date of Discharge: 07/24/2009  Brief Summary:  see Dr. Vic Blackbird note.  Other follow-up issues:  ENT referral, neuro referral, 2D echo and cards eval, rehab for dizziness. Lasix and glipizide not restarted. Consider to restart them after follow up.  Medication list changes:  Removed medication of FUROSEMIDE 40 MG TABS (FUROSEMIDE) Take 1 tablet by mouth once a day Removed medication of GLIPIZIDE 5 MG  TABS (GLIPIZIDE) Take 1/2 tablet by mouth two times a day Added new medication of MECLIZINE HCL 25 MG TABS (MECLIZINE HCL) Take 1-2 tabs every 8 hours as needed. - Signed Rx of ASPIRIN 81 MG CHEW (ASPIRIN) Take 1 tablet by mouth once a day;  #93 x prn;  Signed;  Entered by: Clerance Lav MD;  Authorized by: Clerance Lav MD;  Method used: [facility] to [id number] [address] [place], [place], [place]  [address], Ph: [phone], Fax: [phone] Rx of HYDROCHLOROTHIAZIDE 25 MG  TABS (HYDROCHLOROTHIAZIDE) Take 1 tablet by mouth once a day;  #93 x prn;  Signed;  Entered by: Clerance Lav MD;  Authorized by: Clerance Lav MD;  Method used: Electronically to [id number] [address] [place], [place], [place]  [address], Ph: [phone], Fax: [phone] Rx of GEMFIBROZIL 600 MG TABS (GEMFIBROZIL) Take one tablet 30 minutes before breakfast and dinner.;  #64 x 11;  Signed;  Entered by: Clerance Lav MD;  Authorized by: Clerance Lav MD;  Method used: [facility]ically to [id number] [address], [place], [place], [place]  [address], Ph: [phone], Fax: [phone] Rx of FISH OIL 1000 MG CAPS (OMEGA-3 FATTY ACIDS) Take two tablets two times a day.;  #64 x 11;  Signed;  Entered by: Clerance Lav MD;  Authorized by: Clerance Lav MD;  Method used: [facility] to [id number] [address] [place],  [place], [place]  [address], Ph: [phone], Fax: [phone] Rx of METFORMIN HCL 500 MG XR24H-TAB (METFORMIN HCL) Take 1 tablet by mouth once with breakfast;  #31 x 3;  Signed;  Entered by: Clerance Lav MD;  Authorized by: Clerance Lav MD;  Method used: [facility] to [id number] [address] [place], [place], [place]  [address], Ph: [phone], Fax: [phone] Rx of MECLIZINE HCL 25 MG TABS (MECLIZINE HCL) Take 1-2 tabs every 8 hours as needed.;  #30 x 1;  Signed;  Entered by: Clerance Lav MD;  Authorized by: Clerance Lav MD;  Method used: [facility] [phone]*, [address], [place], [place], [place]  [address], Ph: [phone], Fax: [phone]  Discharge medications:  ASPIRIN 81 MG CHEW (ASPIRIN) Take 1 tablet by mouth once a day HYDROCHLOROTHIAZIDE 25 MG  TABS (HYDROCHLOROTHIAZIDE) Take 1 tablet by mouth once a day LANCETS   MISC (LANCETS) use to test blood sugar once daily TRUETRACK TEST   STRP (GLUCOSE BLOOD) Use to test blood sugar once daily GEMFIBROZIL 600 MG TABS (GEMFIBROZIL) Take one tablet 30 minutes before breakfast and dinner. FISH OIL 1000 MG CAPS (OMEGA-3 FATTY ACIDS) Take two tablets two times a day. METFORMIN HCL 500 MG XR24H-TAB (METFORMIN HCL) Take 1 tablet by mouth once with breakfast MECLIZINE HCL 25 MG TABS (MECLIZINE HCL) Take 1-2 tabs every 8 hours as needed.  Other patient instructions:  We will call you with appointment  on Monday. Please follow up with out patient clinic. If you do not hear from [place], please call us on Monday.  If your symptoms get worse, please follow up in ED or call us.   Note: Hospital Discharge Medications & Other Instructions handout was printed, one copy for patient and a second copy to be placed in hospital chart.  Prescriptions: MECLIZINE HCL 25 MG TABS (MECLIZINE HCL) Take 1-2 tabs every 8 hours as needed.  #30 x 1   Entered and Authorized by:   Clerance Lav MD   Signed by:    Clerance Lav MD on 07/24/2009   Method used:   [facility]        [phone] ([facility]ail)       [address]       [place], [place]  [address]       Ph: [phone]       Fax: [phone]   RxID:   [id number] METFORMIN HCL 500 MG XR24H-TAB (METFORMIN HCL) Take 1 tablet by mouth once with breakfast  #31 x 3   Entered and Authorized by:   Clerance Lav MD   Signed by:   Clerance Lav MD on 07/24/2009   Method used:   [facility]ically to        [facility] [phone]* (retail)       [address]       [place], [place]  [address]       Ph: [phone]       Fax: [phone]   RxID:   [id number] FISH OIL 1000 MG CAPS (OMEGA-3 FATTY ACIDS) Take two tablets two times a day.  #64 x 11   Entered and Authorized by:   Clerance Lav MD   Signed by:   Clerance Lav MD on 07/24/2009   Method used:   [facility] to        [facility] [phone]* (retail)       [address]       [place], [place]  [address]       Ph: [phone]       Fax: [phone]   RxID:   [id number] GEMFIBROZIL 600 MG TABS (GEMFIBROZIL) Take one tablet 30 minutes before breakfast and dinner.  #64 x 11   Entered and Authorized by:   Clerance Lav MD   Signed by:   Clerance Lav MD on 07/24/2009   Method used:   [facility] to        [facility] [phone]* (retail)       [address]       [place], [place]  [address]       Ph: [phone]       Fax: [phone]   RxID:   [id number] HYDROCHLOROTHIAZIDE 25 MG  TABS (HYDROCHLOROTHIAZIDE) Take 1 tablet by mouth once a day  #93 x prn   Entered and Authorized by:   Clerance Lav MD   Signed by:   Clerance Lav MD on 07/24/2009   Method used:   [facility]        [id number] ([facility])       [address] [facility]  [place], [place]  [address]       Ph: [phone]       Fax: [phone]    RxID:   [id number] ASPIRIN 81 MG CHEW (ASPIRIN) Take 1 tablet by mouth once a day  #93 x prn   Entered and Authorized by:   Clerance Lav MD   Signed by:   Clerance Lav MD on 07/24/2009   Method used:   [facility]        [facility] [phone]* (retail)       [address]       [place], [place]  [address]       Ph: [phone]       Fax: [phone]   RxID:   [id number]

## 2010-04-12 NOTE — Assessment & Plan Note (Signed)
Summary: DM TEACHING/CH  Is Patient Diabetic? Yes Did you bring your meter with you today? No Comments doesn't want to check blood sugar at home-says it is  too much to keep up with- has enough keeping up with pills   Allergies: 1)  ! Pcn   Complete Medication List: 1)  Aspirin 325 Mg Tabs (Aspirin) .... Take 1 tablet by mouth once a day 2)  Gemfibrozil 600 Mg Tabs (Gemfibrozil) .... Take one tablet 30 minutes before breakfast and dinner. 3)  Metformin Hcl 1000 Mg Tabs (Metformin hcl) .Marland Kitchen.. 1 tab two times a day 4)  Meclizine Hcl 25 Mg Tabs (Meclizine hcl) .... Take 1-2 tabs every 8 hours as needed. 5)  Crestor 10 Mg Tabs (Rosuvastatin calcium) .... Take 1 tab by mouth once daily. 6)  Enalapril-hydrochlorothiazide 10-25 Mg Tabs (Enalapril-hydrochlorothiazide) .... Take 1 tab by mouth once daily  Other Orders: DSMT (Medicaid) 60 Minutes [phone])  Diabetes Self Management Training  PCP: Lars Mage MD Date diagnosed with diabetes: 09/19/2007 Diabetes Type: Type 2 non-insulin treated Other persons present: no Current smoking Status: never  Assessment Work Hours: Not currently working Daily activities: stays moving as much as possible with residuals from stroke Sources of Support: thinking of moving to [place] with friend on [address] Special needs or Barriers: continues ot be dizzy often despite being only on metformin.   Diabetes Medications:  Lipid lowering Meds? No Anti-platelet Meds? Yes Comments: If patient does not decrease A1C wiht next draw- recommend consideration of increased metfomrin dose to lower it to near 6.5%. Gae him a step counter today as he verbalized being frustrated about not being able to be as active as he'd like to be because of dizziness. Educated patient about how to use nas track steps as well as how to set goals.      Monitoring Self monitoring blood glucose Never Name of Meter  True Track  Recent Episodes of: Requiring Help from another person    Hypoglycemia: No  Other Assessment Performs daily self-foot exams: Yes      Activity Limitations  Inadequate physical activity  Barriers  Physical limitations Diabetes Disease Process  Discussed today State own type of diabetes: Demonstrates competency Medications State name-action-dose-duration-side effects-and time to take medication: Needs review/assistance    Nutritional Management Identify what foods most often affect blood glucose: Needs review/assistance    Verbalize importance of controlling food portions: Needs review/assistance       Monitoring  Complications State the causes-signs and symptoms and prevention of Hyperglycemia: Needs review/assistance   Explain proper treatment of hyperglycemia: Needs review/assistance   State the causes- signs and symptoms and prevention of hypoglycemia: Not applicableExplain proper treatment of hypoglycemia: Not applicable Exercise Diabetes Management Education Done: 08/31/2009    BEHAVIORAL GOAL FOLLOW UP Incorporating physical activity into lifestyle: try to do 1000 steps at least 4 days a week for the next 2 weeks  Incorporating appropriate nutritional management: Sometimes- "goes on binges" budget contraints make it difficult. Preventing,detecting and treating complications: check feet daily- right side is numb- cannot see feet Monitoring blood glucose levels daily: Never- not appropriate goal      Follow- up - 4 weeks by phone, 2 months in office Diabetes Self managment Support-  friends and [facility] staff

## 2010-04-12 NOTE — Progress Notes (Signed)
Summary: Goal follow up/dmr  Phone Note Outgoing Call   Call placed by: Jamison Neighbor RD,CDE,  September 14, 2009 1:53 PM Summary of Call: called to follow up on behavioral goal of counting steps. just picked up pedometer- not working,              BEHAVIORAL GOAL FOLLOW UP Incorporating physical activity into lifestyle: Most of the time      Follow- up - 4 weeks by phone- patient reports he found the free pedometers more frustrating than motivating. He says he will continue to work on diet nad exercise as much as possible. Congratulate dhim on using pedomenters and realizing they are not useful tools for him.

## 2010-04-12 NOTE — Assessment & Plan Note (Signed)
Summary: acute-f/u per patient/(garg)   Vital Signs:  Patient profile:   60 year old male Height:      68 inches (172.72 cm) Weight:      246.8 pounds (112.18 kg) BMI:     37.66 Temp:     96.8 degrees F (36 degrees C) oral Pulse rate:   70 / minute BP sitting:   147 / 86  (right arm) BP standing:   155 / 92  (right arm)  Vitals Entered By: [name] Ditzler RN (Aug 02, 2009 9:17 AM) Is Patient Diabetic? Yes Did you bring your meter with you today? No Pain Assessment Patient in pain? no      Nutritional Status BMI of 25 - 29 = overweight Nutritional Status Detail appetite good CBG Result 257  Have you ever been in a relationship where you felt threatened, hurt or afraid?denies   Does patient need assistance? Functional Status Self care Ambulation Normal Comments FU ER - better. Needs referral Dr Margo Aye and ? PT for balance.   Primary Care Provider:  Noel Gerold   History of Present Illness: Mr. 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 He has a history of vertebral artery dissection and PICA stroke in 2007 that has left him with persistent deficits including chronic dizziness, unsteady gait, difficulty with poprioception and vision as well as right sided hyperesthesia and allodynia. He pesented to the ED with dizziness which is chronic and mild nausea slightly worse than his baseline and was afraid the he was having another stroke. A CT of his head did not reveal anything acute. He has not been seen in the clinic in over a year.    Complaints today include: 1. Pain, numbness and sensitivity of his right upper extremity and chest wall, incluing his right hand. Started after stroke, slowly progressive. Feels like a "match" at times and then sometimes like his arm is "frostbitten". Sometimes his clothes rubbing teh skin is painful.  2. Chronic  constipation, started after stroke. Uses weekly Fleets enema to stimulate bowel function.  3. Decreased apetite, nausea intermittent.  4. Periods of SOB, will cathc himself "not breathing". Reports having a CPAP titration but never got his mask. No syncope or CP.  Depression History:      The patient denies a depressed mood most of the day and a diminished interest in his usual daily activities.        The patient denies that he feels like life is not worth living, denies that he wishes that he were dead, and denies that he has thought about ending his life.         Preventive Screening-Counseling & Management  Alcohol-Tobacco     Alcohol drinks/day: 0     Smoking Status: never     Passive Smoke Exposure: no  Caffeine-Diet-Exercise     Does Patient Exercise: yes     Type of exercise: walking     Times/week: 5  Current Medications (verified): 1)  Aspirin 325 Mg Tabs (Aspirin) .... Take 1 Tablet By Mouth Once A Day 2)  Hydrochlorothiazide 25 Mg  Tabs (Hydrochlorothiazide) .... Take 1 Tablet By Mouth Once A Day 3)  Gemfibrozil 600 Mg Tabs (Gemfibrozil) .... Take One Tablet 30 Minutes Before Breakfast and Dinner. 4)  Metformin Hcl 500 Mg Xr24h-Tab (Metformin Hcl) .... Take 1  Tablet By Mouth Once With Breakfast 5)  Meclizine Hcl 25 Mg Tabs (Meclizine Hcl) .... Take 1-2 Tabs Every 8 Hours As Needed. 6)  Amlodipine Besylate 5 Mg Tabs (Amlodipine Besylate) .... Take 1 Tablet By Mouth Once A Day  Allergies: 1)  ! Pcn  Past History:  Risk Factors: Alcohol Use: 0 (08/02/2009) Exercise: yes (08/02/2009)  Past medical history reviewed for relevance to current acute and chronic problems. Past surgical history reviewed for relevance to current acute and chronic problems. Social history (including risk factors) reviewed for relevance to current acute and chronic problems.  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 Narrowing in the left      vertebral with severe diminution of flow or acute occlusion.      - 2D Echo 2007, normal, no embolic source Osteoarthritis knee Sleep Apnea/Central Apnea Atypical Nevi GERD Glucose Intolerance/DM2  Transaminitis/Statin induced Hypertention Hyperlipidemia Nephrolithiasis  Social History: Reviewed history from 07/16/2008 and no changes required. Single, but he has a girlfriend. Artist-Works odd jobs, and collects rare artifacts from landfills  He has medicaid/disbility post stroke  Review of Systems      See HPI  Physical Exam  General:  alert and well-developed.   Lungs:  normal respiratory effort and normal breath sounds.   Heart:  normal rate, regular rhythm, and no murmur.   Abdomen:  soft, non-tender, normal bowel sounds, and hepatomegaly.   Msk:  normal ROM, no joint tenderness, and no joint swelling.    Swelling w/o redness, non-pitting edema and stiffness of teh Right UE. No hand deformity, limited ROM. Pulses:  R and L carotid,radial,femoral,dorsalis pedis and posterior tibial pulses are full and equal bilaterally Extremities:  No clubbing, cyanosis, edema, or deformity noted with normal full range of motion of all joints.   Neurologic:  alert & oriented X3, cranial nerves II-XII intact, and strength normal in all extremities.    Decreased sensation right UE. Difficulkty with FTN.    Impression & Recommendations:  Problem # 1:  Hx of DISSECTION OF VERTEBRAL ARTERY (ICD-443.24) I spent >30 minutes updating the EMR with relevant information regarding his past stroke in 2007. I was concerned that the patient has not previously been on aspirin and has had minimal f/u with his Hypertension and glucose intolerance. He is much higher risk of having another stroke not being on anti platelet agent  and the fact that he had one previously. His residual deficits are minimal compared to the possible outcome of a PC stroke  and overall I thin he has done quite well. He would probably benefit from continued stroke rehab PMR consultation, PT/OT  given hsi unique deficits. At present he is able to maintain his balance and proprioception reflexes- however as he ages he will be at increased fall risk and risk for signifiant injury.   Today I will work on his medical management for a post stroke patinet- control his BP and put him on full dose ASA. In terms of his worsening symptoms- it is likely multifactorial- see additonal A&P.  At next visit refer to PMR for Stroke Rehab Evaluation of continued needs- even though his stroke was in 2007- he may benefit from continued tehrapies and assistance.  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 The description of hyperesthesia, burning pain, swelling of his right are and shoulder region. he experienceing progressive decline in teh use of his right upper extremity. he may do well from a symoptom standpoint with a sympathetic block- additional PT/OT and PMR referral. May be chronic fibrotic state- will follow.  I will start him on a calcium channel blocker for his BP- may help with adrenergic receptors? Otherwise NSAIDS for pain.  Orders: Rehabilitation Referral (Rehab)  Problem # 3:  SLEEP APNEA (ICD-780.57) Patient had CPAP totration done last year. never was contacted about home mask or CPAP device. will need to get  him set up for this. will call advanced home care. Should improve his fatigue symptoms and BP.  Problem # 4:  HYPERTENSION, BENIGN ESSENTIAL, CONTROLLED (ICD-401.1) BP elevated today. Goal BP 120/80 given his stroke hx. Labs today. His updated medication list for this problem includes:    Hydrochlorothiazide 25 Mg Tabs  (Hydrochlorothiazide) .Marland Kitchen... Take 1 tablet by mouth once a day    Amlodipine Besylate 5 Mg Tabs (Amlodipine besylate) .Marland Kitchen... Take 1 tablet by mouth once a day  Problem # 5:  HYPERLIPIDEMIA (ICD-272.4) Will also repeat LFTs ?statin intolerance- doubtful. His updated medication list for this problem includes:    Gemfibrozil 600 Mg Tabs (Gemfibrozil) .Marland Kitchen... Take one tablet 30 minutes before breakfast and dinner.  Orders: T-Lipid Profile ([id number])  Complete Medication List: 1)  Aspirin 325 Mg Tabs (Aspirin) .... Take 1 tablet by mouth once a day 2)  Hydrochlorothiazide 25 Mg Tabs (Hydrochlorothiazide) .... Take 1 tablet by mouth once a day 3)  Gemfibrozil 600 Mg Tabs (Gemfibrozil) .... Take one tablet 30 minutes before breakfast and dinner. 4)  Metformin Hcl 500 Mg Xr24h-tab (Metformin hcl) .... Take 1 tablet by mouth once with breakfast 5)  Meclizine Hcl 25 Mg Tabs (Meclizine hcl) .... Take 1-2 tabs every 8 hours as needed. 6)  Amlodipine Besylate 5 Mg Tabs (Amlodipine besylate) .... Take 1 tablet by mouth once a day  Other Orders: T- Capillary Blood Glucose (56213) T-Hgb A1C (in-house) ([id number]) T-Urine Microalbumin w/creat. ratio [id number]) T-Comprehensive Metabolic Panel ([id number]) Dermatology Referral (Derma)  Patient Instructions: 1)  Please schedule a follow-up appointment in 2-3 weeks for BP check. 2)  Please schedule him for a day when Phillips Odor is attending. Prescriptions: AMLODIPINE BESYLATE 5 MG TABS (AMLODIPINE BESYLATE) Take 1 tablet by mouth once a day  #30 x 3   Entered and Authorized by:   Julaine Fusi  DO   Signed by:   Julaine Fusi  DO on 08/03/2009   Method used:   Electronically to        [facility]  [address] [phone]* (retail)       [address]       [place], [place]  [address]       Ph: [phone]       Fax: [phone]   RxID:   [id number]  Process Orders Check Orders Results:     [facility]: Order  checked:     Hartley Barefoot MD NOT AUTHORIZED TO ORDER Tests Sent for requisitioning (Aug 03, 2009 11:49 AM):     08/02/2009: [facility] -- T-Urine Microalbumin w/creat. ratio [[id number]] (signed)     08/02/2009: [facility] -- T-Comprehensive Metabolic Panel [phone] (signed)  08/02/2009: [facility] -- T-Lipid Profile [phone] (signed)    Prevention & Chronic Care Immunizations   Influenza vaccine: Not documented   Influenza vaccine deferral: Deferred  (08/02/2009)   Influenza vaccine due: 11/11/2009    Tetanus booster: Not documented   Td booster deferral: Deferred  (08/02/2009)    Pneumococcal vaccine: Not documented    H. zoster vaccine: Not documented   H. zoster vaccine deferral: Deferred  (08/02/2009)  Colorectal Screening   Hemoccult: Not documented    Colonoscopy: Not documented   Colonoscopy action/deferral: Deferred  (08/02/2009)  Other Screening   PSA: Not documented   PSA action/deferral: Discussed-decision deferred  (08/02/2009)   Smoking status: never  (08/02/2009)  Diabetes Mellitus   HgbA1C: 7.0  (08/02/2009)    Eye exam: Not documented    Foot exam: Not documented   High risk foot: Not documented   Foot care education: Not documented    Urine microalbumin/creatinine ratio: 2.1  (12/27/2006)   Urine microalbumin action/deferral: Ordered    Diabetes flowsheet reviewed?: Yes   Progress toward A1C goal: Deteriorated  Lipids   Total Cholesterol: 286  (06/18/2008)   Lipid panel action/deferral: Lipid Panel ordered   LDL: 196  (06/18/2008)   LDL Direct: Not documented   HDL: 48  (06/18/2008)   Triglycerides: 211  (06/18/2008)    SGOT (AST): 64  (07/16/2008)   BMP action: Ordered   SGPT (ALT): 80  (07/16/2008) CMP ordered    Alkaline phosphatase: 62  (07/16/2008)   Total bilirubin: 0.9  (07/16/2008)    Lipid flowsheet reviewed?: Yes   Progress toward LDL goal:  Deteriorated    Stage of readiness to change (lipid management): Action  Hypertension   Last Blood Pressure: 147 / 86  (08/02/2009)   Serum creatinine: 0.93  (07/16/2008)   BMP action: Ordered   Serum potassium 4.2  (07/16/2008) CMP ordered     Hypertension flowsheet reviewed?: Yes   Progress toward BP goal: Deteriorated  Self-Management Support :   Personal Goals (by the next clinic visit) :     Personal A1C goal: 7  (08/02/2009)     Personal blood pressure goal: 130/80  (08/02/2009)     Personal LDL goal: 100  (08/02/2009)    Patient will work on the following items until the next clinic visit to reach self-care goals:     Medications and monitoring: take my medicines every day, bring all of my medications to every visit, examine my feet every day  (08/02/2009)     Eating: eat more vegetables, use fresh or frozen vegetables, eat foods that are low in salt, eat fruit for snacks and desserts, limit or avoid alcohol  (08/02/2009)     Activity: take a 30 minute walk every day, take the stairs instead of the elevator  (08/02/2009)    Diabetes self-management support: Written self-care plan, Education handout, Resources for patients handout  (08/02/2009)   Diabetes care plan printed   Diabetes education handout printed   Last diabetes self-management training by diabetes educator: 10/17/2007   Last medical nutrition therapy: 10/17/2007    Hypertension self-management support: Written self-care plan, Education handout, Resources for patients handout  (08/02/2009)   Hypertension self-care plan printed.   Hypertension education handout printed    Lipid self-management support: Written self-care plan, Education handout, Resources for patients handout  (08/02/2009)   Lipid self-care plan printed.   Lipid education handout printed      Resource handout printed.   Laboratory Results  Blood Tests   Date/Time Received: Aug 02, 2009 9:30 AM. Date/Time Reported: Alric Quan  Aug 02, 2009 9:30 AM   HGBA1C: 7.0%   (Normal Range: Non-Diabetic - 3-6%   Control Diabetic - 6-8%) CBG Random:: 257mg /dL     Appended Document: acute-f/u per patient/(garg) Notes and copy of Sleep Study faxed to [facility] for C-papp machine per Dr Phillips Odor. Pt aware.  Appended Document: acute-f/u per patient/(garg)    Clinical Lists Changes  Orders: Added new Test order of CPAP/BIPAP (CPAP/BIPAP) - Signed

## 2010-04-12 NOTE — Consult Note (Signed)
Summary: SLEEP MEDICINE  SLEEP MEDICINE   Imported By: Margie Billet 09/15/2009 11:06:47  _____________________________________________________________________  External Attachment:    Type:   Image     Comment:   External Document

## 2010-04-12 NOTE — Miscellaneous (Signed)
Summary: [facility]  [facility]   Imported By: Shon Hough 09/28/2009 11:11:30  _____________________________________________________________________  External Attachment:    Type:   Image     Comment:   External Document

## 2010-04-12 NOTE — Progress Notes (Signed)
Summary: med refill/gp  Phone Note Refill Request Message from:  Fax from Pharmacy  Refills Requested: Medication #1:  MECLIZINE HCL 25 MG TABS Take 1-2 tabs every 8 hours as needed.   Dosage confirmed as above?Dosage Confirmed   Brand Name Necessary? No   Supply Requested: 3 months   Last Refilled: 07/24/2009  Medication #2:  ENALAPRIL-HYDROCHLOROTHIAZIDE 10-25 MG TABS take 1 tab by mouth once daily.   Dosage confirmed as above?Dosage Confirmed   Brand Name Necessary? No   Supply Requested: 1 year   Last Refilled: 08/23/2009  Medication #3:  ASPIRIN 325 MG TABS Take 1 tablet by mouth once a day   Brand Name Necessary? No   Supply Requested: 1 year   Last Refilled: 08/23/2009 Last appt. June 13.    Method Requested: Electronic Initial call taken by: Chinita Pester RN,  October 04, 2009 12:03 PM  Follow-up for Phone Call        Refill approved-nurse to complete. Medication 3 Glipizide is not on the medication list any more. Follow-up by: Lars Mage MD,  October 05, 2009 3:41 PM    Prescriptions: ENALAPRIL-HYDROCHLOROTHIAZIDE 10-25 MG TABS (ENALAPRIL-HYDROCHLOROTHIAZIDE) take 1 tab by mouth once daily  #30 x 11   Entered and Authorized by:   Lars Mage MD   Signed by:   Lars Mage MD on 10/05/2009   Method used:   Electronically to        [facility] [phone]* (retail)       [address]       [place], [place]  [address]       Ph: [phone]       Fax: [phone]   RxID:   [id number] MECLIZINE HCL 25 MG TABS (MECLIZINE HCL) Take 1-2 tabs every 8 hours as needed.  #30 x 2   Entered and Authorized by:   Lars Mage MD   Signed by:   Lars Mage MD on 10/05/2009   Method used:   Electronically to        [facility] [phone]* (retail)       [address]       [place], [place]  [address]       Ph: [phone]       Fax: [phone]   RxID:   [id number]

## 2010-04-27 ENCOUNTER — Other Ambulatory Visit: Payer: Self-pay | Admitting: *Deleted

## 2010-04-27 MED ORDER — METFORMIN HCL 1000 MG PO TABS: 1000.0000 mg | ORAL_TABLET | Freq: Two times a day (BID) | ORAL | Status: DC

## 2010-04-27 NOTE — Telephone Encounter (Signed)
*  Please note refill request is for metformin ER 500 mg 1 a day.  This Rx was written at hospital d/c on 07/25/09 but it looks like this is what the pt has been taking.  I tried calling pt to see what his CBG's were and what he was actually taking and phone is not working. Please advise.

## 2010-05-02 ENCOUNTER — Ambulatory Visit (INDEPENDENT_AMBULATORY_CARE_PROVIDER_SITE_OTHER): Payer: Medicaid Other | Admitting: Internal Medicine

## 2010-05-02 ENCOUNTER — Telehealth: Payer: Self-pay | Admitting: *Deleted

## 2010-05-02 ENCOUNTER — Encounter: Payer: Self-pay | Admitting: Internal Medicine

## 2010-05-02 DIAGNOSIS — R42 Dizziness and giddiness: Secondary | ICD-10-CM

## 2010-05-02 DIAGNOSIS — Z0000 Encounter for general adult medical examination without abnormal findings: Secondary | ICD-10-CM

## 2010-05-02 DIAGNOSIS — Z23 Encounter for immunization: Secondary | ICD-10-CM

## 2010-05-02 DIAGNOSIS — E785 Hyperlipidemia, unspecified: Secondary | ICD-10-CM

## 2010-05-02 DIAGNOSIS — I10 Essential (primary) hypertension: Secondary | ICD-10-CM

## 2010-05-02 DIAGNOSIS — E119 Type 2 diabetes mellitus without complications: Secondary | ICD-10-CM

## 2010-05-02 LAB — GLUCOSE, CAPILLARY: Glucose-Capillary: 239 mg/dL — ABNORMAL HIGH (ref 70–99)

## 2010-05-02 MED ORDER — ASPIRIN 325 MG PO TABS: 325.0000 mg | ORAL_TABLET | Freq: Every day | ORAL | Status: DC

## 2010-05-02 MED ORDER — METFORMIN HCL 1000 MG PO TABS: 1000.0000 mg | ORAL_TABLET | Freq: Two times a day (BID) | ORAL | Status: DC

## 2010-05-02 MED ORDER — ENALAPRIL-HYDROCHLOROTHIAZIDE 10-25 MG PO TABS: 1.0000 | ORAL_TABLET | Freq: Every day | ORAL | Status: DC

## 2010-05-02 MED ORDER — ROSUVASTATIN CALCIUM 10 MG PO TABS: 10.0000 mg | ORAL_TABLET | Freq: Every day | ORAL | Status: DC

## 2010-05-02 MED ORDER — GEMFIBROZIL 600 MG PO TABS: ORAL_TABLET | ORAL | Status: DC

## 2010-05-02 MED ORDER — MECLIZINE HCL 25 MG PO TABS: 25.0000 mg | ORAL_TABLET | Freq: Three times a day (TID) | ORAL | Status: DC | PRN

## 2010-05-02 MED ORDER — ASPIRIN BUF(ALHYD-MGHYD-CACAR) 325-50-50-87 MG PO TABS: 1.0000 | ORAL_TABLET | Freq: Every day | ORAL | Status: DC

## 2010-05-02 NOTE — Assessment & Plan Note (Addendum)
-   Flu shot today - States he received last tetanus shot about 4 years ago - schedule colonoscopy at next visit.  - try to keep his care as simple as possible so patient doesn't get overwhelmed - consider long term neuro rehab services, may benefit from this in the long term.

## 2010-05-02 NOTE — Telephone Encounter (Signed)
Pt walked into Clinics today stating that someone told him to come today.  Pt is feeling dizzy.  Has been out of his meds for a few months.  Metformin was restarted by pt 2 days ago.  Has not been checking his sugars.  Has increased voiding.  Some confusion as to meds he should be taking.  Pt to be given an appointment this am.

## 2010-05-02 NOTE — Assessment & Plan Note (Addendum)
Systolic blood pressure of 158 today, in the setting of not taking his medication since June 2011. So for today - restart enalapril-hctz - check bmet at next visit.

## 2010-05-02 NOTE — Assessment & Plan Note (Addendum)
Poorly controlled 2/2 medication noncompliance. Patient is currently on metformin 2000mg  daily. AIC today 7.3 which is actually surprisingly better than expected given his noncompliance. Will not make any med changes today, however, encouraged patient to keep taking his meds as prescribed and encouraged to obtain a pill organizer that will serve as a helpful reminder, since his polyuria and dizziness were very likely 2/2 hyperglycemia. - no med changes. - Foot exam today. - Arrange diabetic eye exam at next visit and check CBGs at next visit.

## 2010-05-02 NOTE — Progress Notes (Signed)
  Subjective:    Patient ID: Mark Cabrera, male    DOB: November 08, 1950, 60 y.o.   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 Patient has not been seen in the clinic since June 2011, today reports that he's here because he's not sure what medications he's supposed to be taking; and has been taking only Metformin and Gemfibrozil intermittently since last June. States he has not had any interval hospitalizations, recurrent strokes, fever, chills, night sweats, chest pain, sob, palpitations, dysuria, hematuria, abnormal weight changes, changes in appetite, melena, or hematochezia. However, patient states that for about 5 days during the 1.5 week period that he did not take his Metformin, that he noticed increased right forearm numbness and polyuria. He stated that he tended to get dizzy more frequently from baseline as well. He does not check his blood sugars at home because he feels its one extra thing that will complicate matters for him as he is barely able to keep up with taking all his medications. He has also not followed up on any of the appointments that have been made for him per last office visit. Today, he states he wants his care to be as simple as possible and wants to be compliant with his medications as long as he knows what he's supposed to be taking and for what reason.    Review of Systems Per HPI above    Objective:   Physical Exam  Constitutional: He is oriented to person, place, and time. He appears well-developed and well-nourished. No distress.  Cardiovascular: Normal rate, regular rhythm and normal heart sounds.  Exam reveals no gallop and no friction rub.   No murmur heard. Pulmonary/Chest: Effort normal and breath sounds normal. No  respiratory distress. He has no wheezes. He has no rales.  Abdominal: Soft. Bowel sounds are normal. There is no tenderness.  Musculoskeletal: Normal range of motion.  Neurological: He is alert and oriented to person, place, and time.       Strength full and 5/5 in all extremities. Sensation diminished to light touch on the right upper extremity, trunk and lower extremity.   Psychiatric: He has a normal mood and affect.          Assessment & Plan:

## 2010-05-02 NOTE — Patient Instructions (Addendum)
Please take ALL your medications exactly as prescribed. If you are having trouble remembering what to take and when to take it, please do not hesitate to call the clinic and we will be very happy to assist. You can also use pill organizer, which you can purchase at [facility] or [facility] to help you remember which medicines to take. Please make sure not to eat anything in the 8 hours prior to your next appointment so that we can check your cholesterol levels. Continue to make sure you are eating a low fat and high fiber diet. Stay away from fried foods and sodas. Again, do not hesitate to call the clinic if you have any questions or concerns.

## 2010-05-02 NOTE — Telephone Encounter (Signed)
Noted  

## 2010-05-02 NOTE — Assessment & Plan Note (Signed)
Has been on Gemfibrozil, started on low dose Crestor during his last June visit due to poor control especially in the setting of his risk factors. However, did not ever start taking it. So for today - continue meds - check FLP at next appointment and readjust meds as needed.

## 2010-05-02 NOTE — Assessment & Plan Note (Signed)
Has been off and on since his stroke 5 years ago. Exacerbated about a week ago, likely 2/2 dehydration and hyperglycemia.  - restart Meclizine, but to be used only on an as needed basis. Suspect with his blood sugars and blood pressure under control, he may not be needing it as much.

## 2010-05-12 ENCOUNTER — Encounter: Payer: Self-pay | Admitting: Internal Medicine

## 2010-05-31 LAB — URINALYSIS, ROUTINE W REFLEX MICROSCOPIC
Glucose, UA: NEGATIVE mg/dL
Ketones, ur: NEGATIVE mg/dL
Nitrite: NEGATIVE
Specific Gravity, Urine: 1.022 (ref 1.005–1.030)
pH: 6.5 (ref 5.0–8.0)

## 2010-05-31 LAB — CBC
MCHC: 34.7 g/dL (ref 30.0–36.0)
MCV: 85.7 fL (ref 78.0–100.0)
RDW: 13 % (ref 11.5–15.5)

## 2010-05-31 LAB — DIFFERENTIAL
Basophils Absolute: 0 10*3/uL (ref 0.0–0.1)
Basophils Relative: 1 % (ref 0–1)
Eosinophils Absolute: 0.3 10*3/uL (ref 0.0–0.7)
Monocytes Absolute: 0.6 10*3/uL (ref 0.1–1.0)
Monocytes Relative: 8 % (ref 3–12)
Neutro Abs: 4.3 10*3/uL (ref 1.7–7.7)
Neutrophils Relative %: 60 % (ref 43–77)

## 2010-05-31 LAB — POCT I-STAT, CHEM 8
BUN: 13 mg/dL (ref 6–23)
Calcium, Ion: 1.16 mmol/L (ref 1.12–1.32)
Glucose, Bld: 119 mg/dL — ABNORMAL HIGH (ref 70–99)
TCO2: 25 mmol/L (ref 0–100)

## 2010-05-31 LAB — POCT CARDIAC MARKERS

## 2010-07-26 NOTE — Consult Note (Signed)
NAME:  Mark Cabrera, Mark Cabrera NO.:  [id number]   MEDICAL RECORD NO.:  [id number]          PATIENT TYPE:  INP   LOCATION:  3039                         FACILITY:  [facility]   PHYSICIAN:  Ollen Gross. Vernell Morgans, M.D. DATE OF BIRTH:  12/15/1950   DATE OF CONSULTATION:  12/31/2006  DATE OF DISCHARGE:                                 CONSULTATION   HISTORY:  Mr. Govan is a 60 year old white male who has a history of a  stroke last year who has some residual defects from a stroke, because of  this he does require some rectal stimulation to have a bowel movement.  He has been using a cucumber at home, but tonight the cucumber broke off  and he could not get the rest of it out.  He denies any pain.  No  fevers, chills.  No nausea, vomiting, chest pain, or shortness of  breath.  His other review of systems unremarkable.   PAST MEDICAL HISTORY:  Significant for:  1. Hypertension.  2. Stroke.   PAST SURGICAL HISTORY:  None.   MEDICATIONS:  Include:  1. Lisinopril.  2. Doxazosin.   ALLERGIES:  PENICILLIN.   SOCIAL HISTORY:  He denies the use of tobacco and occasionally drinks  alcohol.   FAMILY HISTORY:  Significant for diabetes and hypertension in his  parents.   PHYSICAL EXAMINATION:  VITAL SIGNS:  Temperature 97.5, blood pressure  156/91, pulse 81.  GENERAL:  He is a well-developed, well-nourished, white male in no acute  distress.  SKIN:  Warm and dry.  No jaundice.  HEENT:  Extraocular muscles are intact.  Pupils equal, round, reactive  to light.  Sclerae nonicteric.  LUNGS:  Clear bilaterally with no use of accessory respiratory muscles.  HEART:  Regular rate and rhythm with an impulse in the left chest.  ABDOMEN:  Soft and nontender.  No palpable mass or hepatosplenomegaly.  EXTREMITIES:  No cyanosis, clubbing, or edema.  Good strength in his  arms and legs.  PSYCHOLOGICALLY:  He is alert and oriented x3 with no evidence of  anxiety or depression.  RECTAL:  Apparently  the cucumber cannot be felt.   ASSESSMENT AND PLAN:  This is a 60 year old gentleman with a cucumber in  his rectum.  It cannot be felt by digital exam.  We will therefore need  to take him to the operating room for an exam under anesthesia and  removal of  the cucumber.  I have explained to him in detail the risks and benefits  of the operation as well as some of the technical aspects and he  understands and wishes to proceed.  We will obtain some routine  preoperative laboratory work in preparation for doing this for him  tonight.      Ollen Gross. Vernell Morgans, M.D.  Electronically Signed     PST/MEDQ  D:  12/31/2006  T:  01/01/2007  Job:  [id number]

## 2010-07-26 NOTE — Procedures (Signed)
NAME:  Mark Cabrera, Mark Cabrera NO.:  [id number]   MEDICAL RECORD NO.:  [id number]          PATIENT TYPE:  OUT   LOCATION:  SLEEP CENTER                 FACILITY:  [facility]   PHYSICIAN:  Clinton D. Maple Hudson, MD, FCCP, FACPDATE OF BIRTH:  August 27, 1950   DATE OF STUDY:  06/29/2008                            NOCTURNAL POLYSOMNOGRAM   REFERRING PHYSICIAN:  Ileana Roup, M.D.   INDICATION FOR STUDY:  Hypersomnia with sleep apnea.  Epworth sleepiness  score 11/24, BMI 36.5.  Weight 240 pounds, height 68 inches, and neck  17.5 inches.  Home medications charted and reviewed.   SLEEP ARCHITECTURE:  Total sleep time 236 minutes with sleep efficiency  59.7%.  Stage I was 14.2%, stage II 63.8%, stage III absent, REM 22% of  total sleep time.  Sleep latency 39 minutes, REM latency 74 minutes,  awake after sleep onset 119 minutes, arousal index 32.  No bedtime  medication taken.   RESPIRATORY DATA:  Apnea/hypopnea index (AHI) 13.7 per hour.  A total of  54 events was scored including 19 obstructive apneas, 8 central apneas,  1 mixed apnea, and 26 hypopneas.  These events were more common while  nonsupine.  REM AHI 39.2.  An additional 59 respiratory events related  to arousal but not meeting duration and desaturation criteria for  scoring, as apneas and hypopneas gave a total respiratory disturbance  index (RDI) 28.7 per hour.  There were insufficient early events to  permit CPAP titration by split protocol on the study night.   OXYGEN DATA:  Moderate to loud snoring with oxygen desaturation to a  nadir of 88%.  Mean oxygen saturation through the study was 96% on room  air.   CARDIAC DATA:  Occasional PVC.   MOVEMENT/PARASOMNIA:  Periodic limb movement with a total of 172 events  counted, of which 7 were associated with arousal or awakening for  periodic limb movement with arousal index of 1.8 per hour.  One bathroom  trip.   IMPRESSION/RECOMMENDATIONS:  1. Mild obstructive sleep  apnea/hypopnea syndrome, AHI 13.7 per hour.      Events were more common while nonsupine and while in REM.  Moderate      to loud snoring with oxygen desaturation to a nadir of 88% on room      air.  2. There were insufficient events to permit continuous positive airway      pressure titration by split protocol on the study night.  Consider      return for continuous positive airway pressure titration or      evaluate for alternative      management as appropriate.  3. Mild periodic limb movement with arousal, 1.8 per hour.      Clinton D. Maple Hudson, MD, [facility], FACP  Diplomate, Biomedical engineer of Sleep Medicine  Electronically Signed     CDY/MEDQ  D:  07/04/2008 11:28:39  T:  07/04/2008 22:12:53  Job:  [id number]

## 2010-07-26 NOTE — Op Note (Signed)
NAME:  Mark Cabrera, Mark Cabrera NO.:  [id number]   MEDICAL RECORD NO.:  [id number]          PATIENT TYPE:  INP   LOCATION:  2550                         FACILITY:  [facility]   PHYSICIAN:  Ollen Gross. Vernell Morgans, M.D. DATE OF BIRTH:  09/07/1950   DATE OF PROCEDURE:  12/31/2006  DATE OF DISCHARGE:                               OPERATIVE REPORT   PREOPERATIVE DIAGNOSIS:  Cucumber in the rectum.   POSTOPERATIVE DIAGNOSIS:  Cucumber in the rectum.   PROCEDURES:  Exam under anesthesia attempted removal of cucumber  unsuccessfully.   SURGEON:  Ollen Gross. Vernell Morgans, M.D.   ANESTHESIA:  General endotracheal.   PROCEDURE:  After informed consent was obtained, the patient brought to  the operating room placed in supine position on the table.  After  induction of general anesthesia, the patient was placed in lithotomy  position.  His perirectal area was then, prepped with Betadine and  draped in usual sterile manner.  On digital exam I was unable to feel  the cucumber.  On endoscopic exam was unable to see the cucumber.  I was  able to perform a rigid sigmoidoscopy and at about 15 cm, we were able  to identify the cucumber.  We opened up laparoscopic instruments and  through the sigmoidoscope we were able to pull pieces of the cucumber  out.  We were unable to get the whole cucumber removed.  After doing  this for over an hour we decided to stop the procedure and see if he  would be able to pass a smaller piece of cucumber now down to the anus  where we may be able to get hold of it and remove it in another day or  so.  Dressings were applied to the rectum and scopes were removed.  The  patient tolerated well.  At end of the case all needle, sponge,  instrument counts correct.  The patient was awakened  and taken to  recovery in stable condition.      Ollen Gross. Vernell Morgans, M.D.  Electronically Signed     PST/MEDQ  D:  12/31/2006  T:  01/01/2007  Job:  [id number]

## 2010-07-29 NOTE — Assessment & Plan Note (Signed)
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 He was started on heparin and Coumadin in the  hospital. He was seen by vascular surgery, but no surgical intervention was  needed. His swallowing gradually improved over the course of his  hospitalization. His functional status improved to the point where he could  go home with just some family intermittent supervision. He is now doing his  own housework, doing his own cleaning and cooking. He denies any falls. He  started driving again. He started outpatient therapy November 19. He has  seen Dr. Noel Gerold for followup on November 19, but has not seen Dr. Wanda Plump  or Dr. Pearlean Brownie yet.   He states that his bladder problems have resolved, still has numbness and  tingling mainly on the right side, he has gained some weight.   SOCIAL HISTORY:  Divorced.  The patient lives alone. The family checks up on  him.   EXAMINATION:  Blood pressure 148/95, pulse 79, respirations 16, O2 sat 98%  on room air. Oriented x3. Affect: Alert. Walks with a wide-base of support,  slightly shortened stride length. Usually he uses a cane, although he is  able to walk without one indoors.   His lower strength is 5-out-of-5 bilaterally, deltoid, biceps, triceps,  grip, hip flexors, knees, ankle DF/PF.  Sensation is mildly diminished on  the right side compared to the left side. His visual fields are intact to  confrontation testing. He has no deficits with finger-to-nose finger testing  or heel to shin. Mild dysdiadochokinesis on rapid alternating pronation,  supination of the left side.   IMPRESSION:  1. 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 I will see him back in approximately      three weeks to follow up on his rehabilitation progress.  I think he can      go back to driving at this point, given that his motor deficits are      quite minimal and does not have any significant perceptual or visual      deficits.  2. Encourage patient to follow up with Dr. Noel Gerold regards to slight      elevation of his blood pressure. He is on lisinopril and doxazosin.  3. Do not think he really needs to pursue urology followup at this point      given improvements in symptomatology.      Erick Colace, M.D.  Electronically Signed     AEK/MedQ  D:  02/06/2006 14:22:23  T:  02/06/2006 16:31:44  Job #:  [id number]   cc:   Valetta Close, M.D.  Fax: [phone]   Pramod P. Pearlean Brownie, MD  Fax: [phone]

## 2010-07-29 NOTE — Discharge Summary (Signed)
NAME:  Mark Cabrera, Mark Cabrera NO.:  [id number]   MEDICAL RECORD NO.:  [id number]          PATIENT TYPE:  INP   LOCATION:  3032                         FACILITY:  [facility]   PHYSICIAN:  Marolyn Hammock. Reynolds, M.D.DATE OF BIRTH:  13-Aug-1950   DATE OF ADMISSION:  12/27/2005  DATE OF DISCHARGE:  01/03/2006                                 DISCHARGE SUMMARY   DIAGNOSES AT THE TIME OF DISCHARGE:  1. 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  2. Benign prostatic hypertrophy.  3. Borderline hypertension.   MEDICINES AT THE TIME OF DISCHARGE:  1. Flomax 0.4 mg q.h.s.  2. Macrobid one p.o. q.h.s.  3. Coumadin per pharmacy protocol.  4. Heparin per pharmacy protocol.  5. Zocor 20 mg a day.  6. Protonix 40 mg a day.  7. Aspirin 81 mg a day.  8. Thorazine 25 mg p.o. q.h.s. and q.6h. p.r.n.   STUDIES PERFORMED:  1. CT of the brain on admission shows no acute abnormality.  2. Chest x-ray shows no acute disease.  3. MRI of the brain shows acute left lateral medullary infarct in the      distribution of the left posterior inferior cerebral artery.  4. MRA of the head shows severe restriction of flow in the distal left      vertebral.  Question acute thrombosis or dissection.  5. MRA of the neck shows multiple segmental areas of narrowing in the left      vertebral with severe diminution of flow or acute occlusion more      distally, question acute thrombosis or dissection.  Patchy left ICA      disease.  Question previous endarterectomy.  Focal posterior wall      plaque right ICA origin, not definitely flow reducing.  6. Abdominal x-ray confirms PEG tube placement.  7. CT of the chest shows no acute findings.  No dissection.  Mild      aneurysmal dilatation of the descending thoracic aorta.  Great vessels      are patent.  Small nodular interstitial densities in the left lower      lobe.  Findings are suggestive of an infectious or inflammatory  etiology of unknown chronicity.  8. CT angio of the abdomen shows no acute findings.  No aneurysm or      dissection.  Fatty infiltration of the liver.  9. Renal ultrasound shows dependent debris in the bladder; otherwise, no      acute abnormality.  10.EKG showed sinus bradycardia with nonspecific T-wave abnormality.  11.Carotid Doppler shows no ICA stenosis.  Bilateral vertebral artery flow      was antegrade.  12.2-D echocardiogram shows EF of 50% with no left ventricular regional      wall motion abnormalities.  There was trivial aortic valvular      regurgitation.  No obvious embolic source.   LABORATORY STUDIES:  CBC with hemoglobin of 12.5, hematocrit 36.7;  otherwise, normal differential.  Normal coagulation studies on admission.  Normal INR on admission of 1.0.  Chemistry with glucose range 116-166;  otherwise, normal.  Liver function tests with ALT 41 and 42; otherwise,  normal.  Hemoglobin A1c of 6.0.  Homocystine 9.3.  Cardiac enzymes with a  relative index of 2.6; otherwise, normal.  Cholesterol 286, triglycerides  157, HDL 53 and LDL 202.  Urine drug screen was negative.  Urinalysis  negative.  Respiratory culture non-pathogenic.  Cardiac enzymes negative.   HISTORY OF PRESENT ILLNESS:  [name] is a 60 year old Caucasian male  who was talking on the phone for greater than 1 hour at 2:00 a.m. the day of  admission after having been up all night when he developed the sudden onset  of headache, posterior neck pain, broke out into a sweat, had some pain  behind his jaw, as well as developed diaphoresis, dizziness and numbness in  the right hand.  EMS was called.  The patient presented to the [facility]  emergency room 2-1/2 hours later and was found to have symmetric strength,  and a CT of the head was unremarkable.  Neurology was called at 7:30 a.m.  One of the physicians saw the patient at 9 a.m.  It was more than 6 hours  from onset, and, hence, was not a candidate  for thrombolysis or aggressive  treatment.  The patient denies any previous history of stroke.  He was  admitted to the hospital for further evaluation.   HOSPITAL COURSE:  MRI was positive for a new acute infarction.  Posterior  circulation found to be secondary to a vertebral artery dissection, likely  aggravated by the 1-hour phone conversation and tilting his head.  The  patient initially had significant nausea, vomiting, ataxia and hiccups.  The  patient was started on IV heparin and eventually on Coumadin once he was  able to swallow.  The patient was placed on multiple medications for  hiccups, but the patient was eventually placed on Thorazine with good  relief.  He will remain on Thorazine at bedtime and q.6h. p.r.n., as q.6h.  dosing made him very lethargic.  Dr. Darrick Penna of [facility] was consulted for  vertebral artery dissection.  No vascular surgery was indicated.  They did  find the patient has left testicle pain and was questioning that, started on  Cipro.  There was some question maybe the patient had an aortic dissection  was transferred to the unit for further evaluation, but this was found to be  negative.  Incidental consequences found were abnormal nodule on the CT  scan.  Discussed with pulmonary, but felt that the patient was in no acute  respiratory distress, that it was likely unremarkable, but would follow-up  as an outpatient.  The patient reports he has a long history of problems  voiding prior to admission with good health.  The patient had a Foley placed  in the hospital, and on heparin developed hematuria.  It was felt the  hematuria was likely related to trauma.  The Foley was discontinued.  The  patient then had difficulty voiding, and urology was called.  He was started  on Flomax with in and out caths only as needed.  The patient is now able to  void, and will follow up with urology as an outpatient.  Renal ultrasound was unrevealing.  The patient was eventually  able swallow and panda and tube  feedings stopped.  Will encourage p.o. intake and follow on rehabilitation  with ongoing PT and OT prior to return home.  He will participate in  therapy.   CONDITION ON DISCHARGE:  The patient is alert, oriented x3.  No active  disease.  Chest clear to auscultation.  Heart rate is regular.  Nystagmus  with his right gaze.  Face is symmetric.  His palate is not fully raising.  His strength is normal.  He has decreased sensation in his left arm and  face.  He does have ataxic gait.   DISCHARGE/PLAN:  1. Transfer to rehabilitation for ongoing PT, OT and speech therapy.  2. IV heparin to Coumadin for secondary stroke prevention per pharmacy per      stroke protocol.  3. Follow up with urologist at discharge.  4. Follow up with primary care physician at discharge.  5. Follow up with Dr. Delia Heady 2-3 months after discharge.  6. Arrange outpatient pulmonary follow up for lung nodule seen on CT at      return visit.      Annie Main, N.P.      Marolyn Hammock. Thad Ranger, M.D.  Electronically Signed    SB/MEDQ  D:  01/03/2006  T:  01/04/2006  Job:  [id number]   cc:   Pramod P. Pearlean Brownie, MD

## 2010-07-29 NOTE — Assessment & Plan Note (Signed)
Date of last visit March 26, 2006.   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  He has been on Coumadin for 5 months.  He has had  some delay in terms in getting back to neurology, has unpaid balance.  His right knee pain and swelling subsided for about 2 weeks after  arthrocentesis done on March 26, 2006, but he has had reaccumulation.   He has had no falls.  He has been back to driving.  He ambulates without  an assistive device.  He has had no pneumonias.  He has gained some  weight.  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  He has swelling in his  right knee on review of systems.   SOCIAL HISTORY:  Lives alone, divorced.   EXAMINATION:  Blood pressure 116/83, pulse 82, respiratory rate 16,  O2  sat 98% on room air.  GENERAL:  In no acute distress.  Mood and affect appropriate.   Gait is with a mild limp favoring the right lower extremity.  He has  decreased knee flexion and lacks of just a few degrees of end-range  extension.  He has a moderately large effusion at the right knee.  No  erythema, no tenderness to palpation.  He has a negative Apley grind  test, negative Lachman's and negative test for medial lateral  instability.   His motor strength is 5/5 bilateral upper and lower extremities.  He has  decrease in sensation and was able to identify which toe or fingers  touched on the right side.   IMPRESSION:  1. 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  2. Vertebral artery dissection.  Continue on Coumadin until he sees      neurology.  If he can not see them in a timely fashion, may need to      follow up with a neurologist in a university setting.  3. Follow up with Dr. Noel Gerold in regards to his blood pressure.  He      states that he has resumed his lisinopril.  4. Right knee effusion.  We will aspirate and  inject the Depo-Medrol      today.   RIGHT KNEE ARTHROCENTESIS:  Consent was obtained and described risks and  benefits of the procedure, he would like to proceed.   Betadine prep, sterile technique, 21 gauge needle inserted, draining  clear yellowish fluid with some blood tinge.  A total of 75 mL were  removed.  The patient tolerated procedure well.  Then 1 mL of 40 mg/mL  Kenalog plus 2 mL of 1% Lidocaine were injected.  The patient tolerated  the procedure well.   I will see him back on an as needed basis.  He will call for a  appointment.  He will follow up with neurology as well as primary care.      Erick Colace, M.D.  Electronically Signed     AEK/MedQ  D:  04/24/2006 15:11:26  T:  04/24/2006 17:09:50  Job #:  [id number]   cc:   Valetta Close, M.D.  Fax: [phone]   Pramod P. Pearlean Brownie, MD  Fax: [phone]

## 2010-07-29 NOTE — H&P (Signed)
NAME:  DEVRON, COHICK NO.:  [id number]   MEDICAL RECORD NO.:  [id number]           PATIENT TYPE:   LOCATION:                                 FACILITY:   PHYSICIAN:  Ranelle Oyster, M.D.     DATE OF BIRTH:   DATE OF ADMISSION:  01/03/2006  DATE OF DISCHARGE:                                HISTORY & PHYSICAL   REFERRING PHYSICIAN:  Dr. Pearlean Brownie.   CHIEF COMPLAINT:  Left-sided numbness and ataxia.   HISTORY OF PRESENT ILLNESS:  This is a 60 year old white male in good health  admitted on December 27, 2005, after headache and posterior neck pain with  associated hiccups.  The patient presented with dizziness and left-handed  numbness.  CT of the head initially was negative.  He was started on IV  heparin.  MRI and MRA revealed left medullary infarct with left vertebral  artery dissection and poor flow.  Carotid Dopplers were negative for ICA  stenosis.  No surgical intervention was recommended.  Post admission, the  patient has had problems with intermittent nausea, vomiting, and hiccups.  The nausea has greatly resolved, but the patient has continued to have  difficulties with hiccups.  He was treated with Thorazine, which caused  sedation and confusion.  The patient was found on January 02, 2006, to have  solid aspiration of thins with mild residue on nectar.  Currently, he is on  D1 honey-thick liquid diet.  He has had problems with urinary retention  requiring intermittent catheterizations.  GU has been following the patient  and Flomax was added.  I&O caths have been done p.r.n.  The patient  complains of diplopia, particularly on the left lateral and upward fields.  The patient continues to have problems with balance, self-care, and  swallowing and thus it was decided to bring him to the inpatient rehab unit  today.   REVIEW OF SYSTEMS:  Positive for insomnia, hiccups, double vision,  constipation, reflux, occasional palpitations, and urinary retention.  A  full review is in the written history and physical.   PAST MEDICAL HISTORY:  1. Knee arthroscopic surgery.  2. Hypertension.  3. Hyperglycemia, which has been stress-related.  4. Left epididymitis.  5. Heart murmur.  6. Bilateral ankle fracture.   FAMILY HISTORY:  Positive for diabetes and stroke.   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  He was  completely independent prior to arrival.   ALLERGIES:  PENICILLIN.   CURRENT MEDICATIONS:  None.   LABORATORY DATA:  Hemoglobin 14.6; white count 9.2; platelet 270,000; sodium  139; potassium 3.8; BUN 17; creatinine 1.2; glucose 136; cholesterol 286;  triglycerides 157; LDL 202.  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  The patient had diffuse fatty infiltrates of the  liver.   PHYSICAL EXAMINATION:  VITAL SIGNS:  Blood pressure 124/72, pulse 80,  respiratory rate 16.  The patient afebrile.  GENERAL:  The patient is pleasant  and in no acute distress.  He is sitting  at the edge of his bed with his eyes closed due to double vision.  HEENT:  Ear, nose, and throat and exam was remarkable for white coating on  the tongue.  Otherwise, dentition and mucosa were stable.  NECK:  Supple, without JVD or lymphadenopathy.  CHEST:  Notable for slight decrease in breath sounds at the bases, although  this is minimal.  HEART:  Regular rate and rhythm, without murmur, rubs, or gallops.  ABDOMEN:  Soft and nontender.  Bowel sounds are positive.  SKIN:  I saw no deficits today.  EXTREMITIES:  No clubbing, cyanosis, or edema.  NEUROLOGICAL:  Cranial nerves 2-12 were notable for a right central 7.  He  had left facial numbness at 1/2.  He also complained of arm and leg  decreased sensation at 1/2 as well with pinprick and light touch.  The  patient was dys metric with left-handed movement, as well as movement to the   left leg.  Strength was generally preserved at 4+/5 to 5/5 throughout.  The  patient's speech was slightly dystonic and dysarthric, but he is entirely  intelligible.  When we tested his gaze, he had decreased tracking to the  upper visual fields, particularly the left upper quadrant.  Memory and mood  were essentially appropriate.  He is alert and oriented x3.  Overall, his  cognition was intact.   ASSESSMENT/PLAN:  1. Functional deficits secondary to left vetebral artery dissection with      PICA infarct.  The patient with dysphagia, left-sided sensory loss,      hiccups, diplopia, and dizziness.  Begin comprehensive inpatient rehab      with physical therapy to evaluate and treat for lower extremity use,      mobility, and balance.  Occupational therapy will assess for upper      extremity use, activities of daily living, and vestibular deficits.      Speech therapy will follow up for swallowing and cognitive screen.      Twenty-four rehab nurse will assist in management of bowel, bladder,      skin, medication, and safety issues.  The rehab case manager/social      worker will help in the management of the psychosocial and discharge      needs.  Estimated length of stay is 2 weeks.  2. Prognosis:  Fair.  3. Goals:  Supervision to modified independent.  4. DVT prophylaxis with IV heparin/Coumadin.  5. Hiccups:  Add baclofen to regimen as Thorazine was poorly tolerated.  6. Urinary retention.  Check PVRs and I&O cath as needed.  Flomax to      improve flow.  7. Dyslipidemia:  Zocor.  8. Dysphagia:  Continue with honey-thick liquid diet and appropriate      cueing and education for patient.  The patient is on 1 solids as well.  9. Constipation:  Add Senokot-S.      Ranelle Oyster, M.D.  Electronically Signed     ZTS/MEDQ  D:  01/03/2006  T:  01/04/2006  Job:  [id number]

## 2010-07-29 NOTE — H&P (Signed)
NAME:  Mark Cabrera, Mark Cabrera                 ACCOUNT NO.:  [id number]   MEDICAL RECORD NO.:  [id number]          PATIENT TYPE:  INP   LOCATION:  1828                         FACILITY:  [facility]   PHYSICIAN:  Pramod P. Pearlean Brownie, MD    DATE OF BIRTH:  24-Oct-1950   DATE OF ADMISSION:  12/27/2005  DATE OF DISCHARGE:                                HISTORY & PHYSICAL   REFERRING PHYSICIAN:  Elliott L. Effie Shy, M.D.   REASON FOR REFERRAL:  Stroke.   HISTORY OF PRESENT ILLNESS:  Mark Cabrera is a 59 year old Caucasian male who  was talking on the phone at 2:00 a.m. today after having been up all night  when he developed sudden onset of headache, posterior neck pain, broke out  into a sweat, had some pain behind his jaw as well and developed  diaphoresis, dizziness and numbness in the right hand.  Patient presented to  the [facility] emergency room two-and-a-half hours later and was found to  have symmetric strength and he had CT scan of the head which was  unremarkable.  Subsequently, neurology was called at 7:30 a.m. and when I  saw the patient at 9 o'clock it was more than six hours from the onset and;  hence, he was not a candidate for thrombolysis or aggressive intervention.  Patient denies any known prior history of stroke, TIAs, seizures or  neurological problems.   PAST MEDICAL HISTORY:  Significant for:  1. Borderline hypertension.  2. Diabetes for which he does not take medicines.   HOME MEDICATIONS:  None.   MEDICATION ALLERGIES:  PENICILLIN.   PAST SURGICAL HISTORY:  Knee surgery.   SOCIAL HISTORY:  Patient lives alone in [place], he is an Tree surgeon, he does  not smoke, drink and denies doing drugs.   REVIEW OF SYSTEMS:  Positive for diaphoresis, sweating and chest pain,  dizziness, headache, nausea and vomiting.   PHYSICAL EXAMINATION:  GENERAL:  This is a pleasant, middle-age Caucasian  male who is uncomfortable due to nausea.  VITAL SIGNS:  He is afebrile,  pulse rate is 55 to 65 beats  per minute, regular sinus, blood pressure is  196/106.  Distal pulses are felt.  HEAD:  Nontraumatic.  NECK:  Supple without bruit.  ENT EXAM:  Unremarkable.  CARDIAC EXAM:  No murmur or gallop.  LUNGS:  Clear to auscultation.  NEUROLOGICAL EXAM:  Patient is awake, alert, oriented x3.  There is no  aphasia.  He has a hypophonic dysarthria.  Eye movements are full range,  there is right beating nystagmus.  Face is symmetric.  Fine motor movements  are diminished on the right, has a weak cough and gag, tongue movements  normal.  He has no upper extremity deficits, symmetric strength, tone  reflexes in all four extremities, finger-to-nose and knee-to-heel  coordination is slow but accurate.  Plantars are downgoing.  The patient has  significant truncal ataxia and complains of dizziness and is unable to sit  up without support.   IMPRESSION:  A 60 year old male with sudden onset of headache, posterior  neck pain, vertigo and paresthesias,  likely due to right brainstem infarct,  etiology probably vertebral artery dissection.  Patient has unfortunately  presented beyond three to six hours and; hence, is not a candidate for  aggressive intervention.   PLAN:  Will admit the patient to the stroke service, start IV heparin for  vertebral artery dissection to prevent clot propagation.  IV Reglan for his  nausea and vomiting, IV hydration with normal saline.  Keep him NPO until  the following improves.  Check an MRI scan with MRA of the brain and neck,  echo, Dopplers, stroke labs.           ______________________________  Mark Schlein. Pearlean Brownie, MD     PPS/MEDQ  D:  12/27/2005  T:  12/27/2005  Job:  [id number]

## 2010-07-29 NOTE — Discharge Summary (Signed)
NAME:  Mark Cabrera, Mark Cabrera NO.:  [id number]   MEDICAL RECORD NO.:  [id number]          PATIENT TYPE:  INP   LOCATION:  3039                         FACILITY:  [facility]   PHYSICIAN:  Clovis Pu. Cornett, M.D.DATE OF BIRTH:  [birth year]   DATE OF ADMISSION:  12/31/2006  DATE OF DISCHARGE:  01/01/2007                               DISCHARGE SUMMARY   DISCHARGING PHYSICIAN:  Dr. Maisie Fus A. Cornett.   CHIEF COMPLAINT/REASON FOR ADMISSION:  Mark Cabrera is a 60 year old male  patient, history of prior stroke, who reports that he requires digital  rectal stimulation to have a BM.  Usually utilizes slim cylindrical  household items at home to stimulate to have a BM.  This time he opted  to use a cucumber, but unfortunately the cucumber broke off, and he was  unable to remove the cucumber.  He was not having any abdominal pain,  but he did present to the ER because he was concerned with the cucumber  in the rectum.  On clinical exam, the patient's abdomen was benign, and  on rectal exam, Dr. Carolynne Edouard was unable to feel the cucumber.  The patient  was admitted so he could be taken to the OR for rectal exam under  anesthesia with probable removal of retained cucumber.   HOSPITAL COURSE:  The patient was taken to the OR by Dr. Carolynne Edouard on the  date of admission for rigid sigmoidoscopy.  He was unable to remove all  the cucumber, since the cucumber began to break up into smaller pieces  with attempts to remove the cucumber.  The patient was subsequently  admitted to the surgical floor with plans to give patient laxatives of  fluids to see if he could pass the cucumber on his own.  If he was  unable to do this and became completely obstructed with increasing pain  and nausea and vomiting, the patient may require urgent laparotomy and a  repeat sigmoid examination.  With the use of combination of Dulcolax  suppository and MiraLax, and clear-liquid diet, the patient was  eventually able to pass the  remainder of the cucumber pieces.  He was  also empirically given Cipro and Flagyl to cover any potential enteric  pathogen issues.   DISCHARGE DIAGNOSES:  1. Foreign body retained in rectum/cucumber, status post a rigid      sigmoidoscopy with unsuccessful removal of entire cucumber.  2. Successful passage of remaining cucumber from rectum without any      abdominal or rectal sequelae.   DISCHARGE MEDICATIONS:  The patient resumed the following home  medications:  1. Doxazosin a milligram q. hour sleep.  2. Lisinopril 20 mg daily.   DISCHARGE INSTRUCTIONS:  The patient is to follow up with Dr. Carolynne Edouard, or  come back to the ER if he has any other problems; otherwise, follow up  with primary care physician.      Allison L. Rennis Harding, N.P.      Thomas A. Cornett, M.D.  Electronically Signed    ALE/MEDQ  D:  02/15/2007  T:  02/16/2007  Job:  [id number]  cc:   Ollen Gross. Vernell Morgans, M.D.

## 2010-07-29 NOTE — Discharge Summary (Signed)
NAME:  Mark Cabrera, Mark Cabrera NO.:  [id number]   MEDICAL RECORD NO.:  [id number]          PATIENT TYPE:  IPS   LOCATION:  4140                         FACILITY:  [facility]   PHYSICIAN:  Erick Colace, M.D.DATE OF BIRTH:  May 31, 1950   DATE OF ADMISSION:  01/03/2006  DATE OF DISCHARGE:  01/24/2006                                 DISCHARGE SUMMARY   HISTORY OF PRESENT ILLNESS:  Mark Cabrera is a 60 year old male in relatively  good health, admitted to [facility] on October 17 with headache and  posterior neck pain with numbness, left hand, and dizziness.  CT of head and  neck revealed a high suspicion of vertebral artery dissection.  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  Carotid  Dopplers showed no ICA stenosis.  Dr. Darrick Cabrera of [facility] was consulted for input  and felt no surgical intervention needed currently.  He recommended  continuing anticoagulation.  The patient has had issues with nausea,  vomiting, hiccups since admission, and his most recent MBS showed silent  aspiration of thin and mild residue of nectar, currently on D1 honey-thick  liquids.  The patient also has issues regarding urinary retention requiring  in-and-out catheterization as well as gross hematuria.  GU is following the  patient and a renal ultrasound done and Flomax added recently.  Currently  the patient continues with intermittent hiccups as well as reports of  diplopia and numbness, right upper extremity.  Therapies initiated, and the  patient is noted to have tendency to lean to the left and poor endurance  issues.  Rehab consulted for progressive therapy.   PAST MEDICAL HISTORY:  1. Knee arthroscopy.  2. Borderline hypertension.  3. Stress-related hyperglycemia.  4. Left epididymitis.  5. History of heart murmur.  6. History of bilateral ankle fractures in the past.   ALLERGIES:   PENICILLIN.   FAMILY HISTORY:  Positive for diabetes and CVA.   SOCIAL HISTORY:  The patient lives alone.  Works as an Tree surgeon.  Was  independent prior to admission.  Does not use any tobacco.  Uses alcohol  socially.   HOSPITAL COURSE:  Mark Cabrera was admitted to rehab on January 03, 2006,  for inpatient therapies to consist of PT/OT and speech therapy.  Past  admission a Foley was initially kept in place and secondary to continued  issues with hematuria.  Renal urinary tract ultrasound done showed dependent  debris in the bladder.  The patient was maintained on Coumadin throughout  his stay.  Labs done past admission showed H&H of 12.7 and 36.2, white count  9.3, platelets 299.  Check of electrolytes initially showed mild hypokalemia  with a potassium of 3.4.  Blood sugars on a.m. labs were noted to be  variable from 120 to 160s range.  Hemoglobin A1c was noted to be borderline  high at 6.1.  Voiding was monitored with PVR checks __________ with  requiring in-and-out catheterizations of 600 mL the first evening  with  bloody urine.  The Foley was replaced.  Macrodantin p.o. q.h.s. was  initially added.  A UA/UC done showed E. coli UTI; therefore, Macrodantin  was increased to 10 mg p.o. q.i.d.  On October 28 the patient spiked a  temperature of 101.7.  Macrodantin was discontinued and the patient was  changed over to IV Rocephin q.24h. from October 28 to November 5.  GU had  been following patient and recommended Foley discontinuation on November 15  with addition of Avodart 0.5 mg per day with in-and-out catheterization q.8-  12h. if no void.  Once Foley discontinued, the patient was noted to have  issues with urinary retention with volumes up to 400-500 mL.  Initially the  patient allowed in-and-out catheterization but has refused catheterization  in the past few days.  He is aware of need for voiding as well as double  voiding to keep bladder empty.  Attempts were made to  educate the patient on  in-and-out catheterization.   The patient has had issues with hiccups that were treated with Baclofen  p.r.n.  He has also had intermittent nausea and vomiting with occasionally  having exacerbation of vestibular symptoms.  Repeat CT was of head were done  to follow up on neurological status on October 28 as well as November 2.  No  new intracranial abnormalities, no hemorrhage noted.  The patient has had  issues with constipation requiring adjustments to the bowel program.  He did  have complaints of severe left lower quadrant pain November 1.  KUB done  showed nonspecific bowel gas pattern.  CT of the abdomen and pelvis showed  left hydronephrosis with ureter ectasis with delayed nephrogram, fatty  infiltration of the liver, with question of recent stone passage and  scattered sigmoid diverticula.  GU felt the patient's symptoms probably  secondary to passage of stone.  Abdominal pain has resolved.   Internal medicine service was contacted regarding assistance with management  of the patient's blood pressure and also setting up patient with LMD.  They  recommended a 90-month trial of diet control for elevated a.m. blood sugars  and will be followed on an outpatient basis once current medical issues  involved.  The patient was started on Altace 2.5 mg b.i.d. with improvement  in blood pressures initially.  This was changed to prinivil 20 mg p.o. daily  at time of discharge secondary to financial issues with questionable ability  of patient to afford his medications.  The patient was also changed over  from Flomax to Cardura 1 mg p.o. q.h.s.  as generics of this are available.  At time of discharge patient to continue on Coumadin 10 mg q.p.m. INR  currently at 1.9.  blood pressures 24 hours prior to discharge ranging from  130s to 140s systolic, 80s to 46N diastolic, heart rate stable.  His p.o. intake has been good.  Speech therapy has been following the patient  for  treatment of dysphagia with E-Stim as well as water protocol.  Most recent  MBS of November 9 shows the patient with mild pharyngeal dysphagia with  residual with larger boluses without a chin tuck.  Also noted to have  premature spillage intermittently without chin tuck.  Problems with swallow  eliminated with use of chin tuck.  The patient is currently on a regular  diet, thin liquids, has been instructed regarding chin tuck with all liquids  as well as occasional dry swallow and taking medications whole in puree.  He  set up a follow-up swallow study in the next few weeks for further  advancement.  The patient's nausea is greatly improved.  He currently  requires only occasional dosage of Phenergan when vestibular symptoms  worsen.  Senokot S two p.o. q.h.s. is being used to help with constipation  issues.  Secondary to safety concerns, 24-hour supervision is recommended  and the patient's family is to provide this.  He is to continue using a  walker for ambulation.  Currently the patient is able to ambulate 150 feet  with a rolling walker in a static environment with supervision.  Ambulation  with a single-point cane requires minimum assist with the patient showing  occasional difficulty with sequences.  For transfers, overall at modified  independent level.  He requires supervision to navigate obstacles.  The  patient is at minimum to guard assist for showering, no loss of balance.  He  had discussions regarding limiting functional activities to short and  especially to avoid problems with dizziness and nausea.  Currently the  patient is independent for strategies for swallow.  Has been able to  tolerate E-Stim without difficulty.  He will continue to receive further  follow-up outpatient PT/OT and speech therapy at [facility]  beginning November 19.  On January 24, 2006, the patient is discharged to  home.   DISCHARGE MEDICATIONS:  1. Coumadin 5 mg  two p.o.  q.p.m.  2. Zocor 40 mg q.h.s.  3. Coated aspirin 81 mg a day.  4. Senokot S two p.o. q.h.s.  5. AcipHex or Prilosec 20 mg a day.  6. Avodart 0.5 mg q.h.s.  7. Cardura 1 mg p.o. q.h.s.  8. Prinivil 20 mg a day.  9. Phenergan 25 mg p.o. q.6h. p.r.n. nausea.   DIET:  Regular with moderation of starches.  Thin liquids with thin tuck.  Occasional dry swallow with liquids.   SPECIAL INSTRUCTIONS:  Take medications in puree.  Twenty-four hour  supervision.  No alcohol, no smoking, no driving.  Walk with a walker.  [facility] outpatient rehab November 19, 8:40/11:30 and November 26 at 10  a.m.   FOLLOW-UP:  Patient to follow up with Dr. Wynn Banker November 11 at 12 p.m.  Follow up with Dr. Pearlean Brownie in one month.  Follow up with Dr. Wanda Plump for a  cystoscopy on November 26 at 1:30 a.m.  Follow up with Dr. Noel Gerold January 29, 2006, for appointment as well protime check.      Greg Cutter, P.A.     Erick Colace, M.D.  Electronically Signed    PP/MEDQ  D:  01/24/2006  T:  01/24/2006  Job:  [id number]   cc:   Pramod P. Pearlean Brownie, MD  Janetta Hora Mark Penna, MD  [employer], M.D.  Valetta Close, M.D.

## 2010-07-29 NOTE — Op Note (Signed)
NAME:  BARTOW, ZYLSTRA NO.:  [id number]   MEDICAL RECORD NO.:  [id number]          PATIENT TYPE:  AMB   LOCATION:  DSC                          FACILITY:  [facility]   PHYSICIAN:  Dyke Brackett, M.D.    DATE OF BIRTH:  22-Jul-1950   DATE OF PROCEDURE:  05/12/2005  DATE OF DISCHARGE:                                 OPERATIVE REPORT   INDICATIONS FOR PROCEDURE:  The patient is a 60 year old male with MRI  proven meniscal tear thought to be amenable to outpatient surgery of the  right knee.   PREOPERATIVE DIAGNOSIS:  Torn medial meniscus.   POSTOPERATIVE DIAGNOSIS:  Torn medial meniscus.  Osteoarthritis of  patellofemoral joint.   OPERATION:  Partial medial meniscectomy.   SURGEON:  Dyke Brackett, M.D.   ANESTHESIA:  General with local.   DESCRIPTION OF PROCEDURE:  He was arthroscoped through an inferomedial and  inferolateral portal.  The lateral compartment was essentially normal.  ACL  showed some chronic laxity, medial compartment showed a complex tear of the  posterior horn with some mild changes of chondromalacia of the femoral  condyle, grade 2, early grade 3, slight changes on the tibia debrided. Full  thickness cartilage loss, however, was appreciated which I do not believe  was seen on the MRI on the patellofemoral area.  The trochlear groove  extensive involvement of that as well as the patella.  Some debridement of  this area was carried out but this was mainly grade 4 extensive lesions on  both sides of the joint involving probably at least 50% of that whole  surface area.  Knee drained free of fluid.  Portals closed with nylon, knee  infiltrated with Marcaine and morphine, additional 4 mg Depo-Medrol with an  additional 10 mL of Marcaine with epinephrine into the portals.  Taken to  recovery room in stable condition.      Dyke Brackett, M.D.  Electronically Signed     WDC/MEDQ  D:  05/12/2005  T:  05/12/2005  Job:  [id number]

## 2010-07-29 NOTE — Assessment & Plan Note (Signed)
Monday, March 26, 2006:   The patient returns today. Has a history of vertebral artery dissection  on December 27, 2005. He was seen at [facility] by Dr. Darrick Penna with [facility]  and felt no surgical input was needed. He was followed on inpatient  rehab and discharged on January 24, 2006. Followups to be made with Dr.  Pearlean Brownie in 1 month. The patient has not followed up thus far. He did see  me on one occasion. He has seen Dr. Noel Gerold on a couple of occasions. He  has been monitored on his Coumadin, although he quit it about a week ago  because nobody would drain his right knee effusion because of the  Coumadin. His other main complaint is right-sided numbness/tingling pain  as well as some persistent problems with swallowing.   He has had no falls. He has been back driving. He ambulates without an  assistive device.   He has some intermittent shortness of breath, but this is mainly when  his swallowing feels choked up. He has had a followup modified barium  swallow and he just was recommended to use a chin tuck, but no other  thickening of liquids was needed.   SOCIAL HISTORY:  Divorced. Lives alone.   PHYSICAL EXAMINATION:  Blood pressure is 160/78, pulse 81, respiratory  rate is 16, O2 sat is 99% on room air. The patient states that he quit  his blood pressure medicine about a week ago as well.  He has normal strength bilateral lower and upper extremities. He has  decreased sensation of his right side. He has no evidence of skin  lesions, although one healed cut on his finger that he states that he  did not feel when he cut his finger. Lower extremities have no evidence  of spasticity. Heel-to-shin as well as finger-to-nose-to-finger testing  was intact.  LUNGS:  Clear.  HEART: Regular rate and rhythm.  His right knee shows large effusion. No lateral instability. He has a  mild medial instability. No tenderness to palpation on the medial or  lateral joint lines. No erythema or  tenderness to palpation.   IMPRESSION:  1. 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  2. Vertebral artery dissection. Recommended to continue on Coumadin      until he sees neurology again.  3. Followup with Dr. Noel Gerold in regards to his blood pressure. I urged      him to resume his lisinopril and doxazosin.  4. Traumatic arthropathy right knee, likely due to cruciate ligament      sprain. Will drain his knee today.   ADDENDUM:  Right knee arthrocentesis.   Betadine prep, sterile technique, 18-guage needle drained clear  yellowish fluid, which totaled 60 cc. The patient tolerated the  procedure well.   Post-procedure instructions given. He states that he will follow up with  Dr. Noel Gerold for primary care and have made a referral back to Dr. Pearlean Brownie.      Erick Colace, M.D.  Electronically Signed     [facility]  D:  03/26/2006 14:34:04  T:  03/26/2006 20:30:41  Job #:  [id number]   cc:   Pramod P. Pearlean Brownie, MD  Fax: [phone]   Valetta Close, M.D.  Fax: [phone]

## 2010-12-21 LAB — CBC
HCT: 37.5 — ABNORMAL LOW
Hemoglobin: 12.8 — ABNORMAL LOW
Hemoglobin: 12.9 — ABNORMAL LOW
MCHC: 33.9
MCV: 84.8
RBC: 4.44
RBC: 4.46
RDW: 12.9
RDW: 13
WBC: 7.9

## 2010-12-21 LAB — DIFFERENTIAL
Basophils Absolute: 0.1
Basophils Relative: 1
Eosinophils Absolute: 0.2
Eosinophils Relative: 3
Lymphocytes Relative: 24
Lymphs Abs: 1.9
Monocytes Absolute: 0.7
Monocytes Absolute: 1 — ABNORMAL HIGH
Monocytes Relative: 8
Monocytes Relative: 9
Neutro Abs: 5

## 2010-12-21 LAB — BASIC METABOLIC PANEL
CO2: 27
Calcium: 9.3
GFR calc Af Amer: >60
Glucose, Bld: 78
Potassium: 4
Sodium: 137

## 2010-12-21 LAB — I-STAT 8, (EC8 V) (CONVERTED LAB)
Chloride: 105
Glucose, Bld: 96
HCT: 41
Potassium: 4.3
Sodium: 139
TCO2: 28
pH, Ven: 7.34 — ABNORMAL HIGH

## 2010-12-21 LAB — TYPE AND SCREEN: Antibody Screen: NEGATIVE

## 2010-12-21 LAB — POCT I-STAT CREATININE
Creatinine, Ser: 1.1
Operator id: 151321

## 2011-02-06 ENCOUNTER — Encounter: Payer: Medicaid Other | Admitting: Internal Medicine

## 2011-03-21 ENCOUNTER — Encounter: Payer: Medicaid Other | Admitting: Internal Medicine

## 2011-04-17 ENCOUNTER — Encounter: Payer: Self-pay | Admitting: Internal Medicine

## 2011-04-17 ENCOUNTER — Ambulatory Visit (INDEPENDENT_AMBULATORY_CARE_PROVIDER_SITE_OTHER): Payer: Medicaid Other | Admitting: Internal Medicine

## 2011-04-17 VITALS — BP 155/88 | HR 77 | Temp 97.0°F | Resp 20 | Ht 69.0 in | Wt 243.0 lb

## 2011-04-17 DIAGNOSIS — Z0000 Encounter for general adult medical examination without abnormal findings: Secondary | ICD-10-CM

## 2011-04-17 DIAGNOSIS — E119 Type 2 diabetes mellitus without complications: Secondary | ICD-10-CM

## 2011-04-17 DIAGNOSIS — I10 Essential (primary) hypertension: Secondary | ICD-10-CM

## 2011-04-17 DIAGNOSIS — M1711 Unilateral primary osteoarthritis, right knee: Secondary | ICD-10-CM

## 2011-04-17 DIAGNOSIS — M1710 Unilateral primary osteoarthritis, unspecified knee: Secondary | ICD-10-CM

## 2011-04-17 DIAGNOSIS — E785 Hyperlipidemia, unspecified: Secondary | ICD-10-CM

## 2011-04-17 DIAGNOSIS — L821 Other seborrheic keratosis: Secondary | ICD-10-CM | POA: Insufficient documentation

## 2011-04-17 DIAGNOSIS — Z23 Encounter for immunization: Secondary | ICD-10-CM

## 2011-04-17 NOTE — Patient Instructions (Addendum)
   Please follow-up at the clinic in 1 month, at which time we will reevaluate your diabetes, blood pressure.  There have been changes in your medications, please look at your complete medication list for details - please make sure you are taking your metformin 2 times daily.   Please follow-up in the clinic sooner if needed.  If you have been started on new medication(s), and you develop symptoms concerning for allergic reaction, including, but not limited to, throat closing, tongue swelling, rash, please stop the medication immediately and call the clinic at [phone], and go to the ER.  If you are diabetic, please bring your meter to your next visit.  If symptoms worsen, or new symptoms arise, please call the clinic or go to the ER.  Please bring all of your medications in a bag to your next visit.

## 2011-04-17 NOTE — Assessment & Plan Note (Signed)
Labs: Lab Results  Component Value Date   HGBA1C 7.6 04/17/2011   HGBA1C 7.0 08/02/2009   CREATININE 0.94 08/31/2009   MICROALBUR 0.99 08/02/2009   MICRALBCREAT 6.8 08/02/2009   CHOL 266* 08/02/2009   HDL 44 08/02/2009   TRIG 202* 08/02/2009    Assessment:  Disease Control: not controlled  Progress toward goals: deteriorated  Barriers to meeting goals: patient is taking his metformin only once daily. Has previously had multiple issues with compliance.   Plan: Glucometer log was not reviewed today, as pt did not have glucometer available for review.      Increase metformin to BID 1000mg .  reminded to bring blood glucose meter & log to each visit  Give PCV, flu shot today.  Already on an ACE-I.

## 2011-04-17 NOTE — Assessment & Plan Note (Signed)
Patient continues to have worsening pain, and finds his knee buckling more frequently. Previously was advised to get knee surgery secondary to severe osteoarthritis on right side. He would like to be referred back to orthopedics today, for consideration of surgery.  Orthopedics referral.  PRN tylenol for pain.

## 2011-04-17 NOTE — Assessment & Plan Note (Signed)
Pt's skin lesions are most consistent with seborrheic keratosis and he also has a few moles. Location of these lesions makes them uncomfortable. He would like to get a dermatology referral for consideration of removal if possible.  Dermatology referral placed.

## 2011-04-17 NOTE — Progress Notes (Signed)
Subjective:   Patient ID: Mark Cabrera male   DOB: [birth year] 61 y.o.   MRN: [id number]  HPI: [name] is a 61 y.o. with a PMHx of DMII, HTN, GERD, who presented to clinic today for the following:  1) Skin lesions on back - has had multiple lesion on his back for greater than 1 year, unsure if it has changed in color, shape, size. No unintentional weight. Denies prolonged sun exposure, no personal or family history of skin cancer.   2) DMII - last A1c 7.6 today - Patient not checking his blood sugars regularly. Currently taking metformin 1000mg  - but only taking it once daily instead of twice daily as instructed. Does not want to check his blood sugars, because cannot keep up with it. denies polyuria, polydipsia, nausea, vomiting, diarrhea. does request refills today.  3) History of osteoarthritis right knee - has previously required multiple "scrapings", previously requiring effusion drainage on multiple occasions (chart review indicates at least 4-5 times previously). Has had recent giving out of his right knee without subsequent falls. He wants to be referred to ortho for consideration for surgical intervention.    Review of Systems: Per HPI.  Current Outpatient Medications: Medication Sig  . aspirin 325 MG tablet Take 1 tablet (325 mg total) by mouth daily.  . enalapril-hydrochlorothiazide (VASERETIC) 10-25 MG per tablet Take 1 tablet by mouth daily.  Marland Kitchen gemfibrozil (LOPID) 600 MG tablet Take one tablet 30 minutes before breakfast and one tablet 30 minutes before dinner  . metFORMIN (GLUCOPHAGE) 1000 MG tablet Take 1 tablet (1,000 mg total) by mouth 2 (two) times daily with meals. --> has only been taking once daily   Supposed to be on Crestor 10mg , but only took once or twice because he read the side effects and does not want to take.   Allergies  Allergen Reactions  . Penicillins     Past Medical History  Diagnosis Date  . 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 2-D echo was normal no embolic source found.  . Osteoarthritis, knee   . Sleep apnea      Central apnea. Occasionally wears his CPAP at night.  . Atypical nevi   . GERD (gastroesophageal reflux disease)   . Diabetes mellitus   . Transaminitis      Statin-induced  . Hypertension   . Hyperlipidemia     hx of transaminitis secondary to statin and he has decided not to use statins secondary to potential side effects.  . Nephrolithiasis     Past Surgical History  Procedure Date  . Knee arthroscopy w/ partial medial meniscectomy 05/12/2005    right, performed by Dr. Madelon Lips for torn medial meniscus.     Objective:   Physical Exam: Filed Vitals:   04/17/11 1426  BP: 155/88  Pulse: 77  Temp: 97 F (36.1 C)  Resp: 20      General: Vital signs reviewed and noted. Well-developed, well-nourished, in no acute distress; alert, appropriate and cooperative throughout examination.  Head: Normocephalic, atraumatic.  Lungs:  Normal respiratory effort. Clear to auscultation BL without crackles or wheezes.  Heart: RRR. S1 and S2 normal without gallop, murmur, or rubs.  Abdomen:  BS normoactive. Soft, Nondistended, non-tender.  No masses or  organomegaly.  Extremities: No pretibial edema. Right knee - crepitus of patellofemoral joint with flexion/ extension of the knee. No joint effusions appreciated currently, no warmth, redness, or swelling of right knee.  Skin: Multiple small skin lesions on back, most consistent with seborrheic keratosis, subcentimeter, the one in the middle of his back is scaly. Also has multiple raised small moles on his back, the one at his collar line is slightly tender.     Assessment & Plan:  Case and  plan of care discussed with Dr. Blanch Media.

## 2011-04-17 NOTE — Assessment & Plan Note (Signed)
BP Readings from Last 3 Encounters:  04/17/11 155/88  05/02/10 156/85  08/23/09 146/89    Basic Metabolic Panel:    Component Value Date/Time   NA 137 08/31/2009 1819   K 4.0 08/31/2009 1819   CL 101 08/31/2009 1819   CO2 23 08/31/2009 1819   BUN 13 08/31/2009 1819   CREATININE 0.94 08/31/2009 1819   GLUCOSE 185* 08/31/2009 1819   CALCIUM 9.3 08/31/2009 1819    Assessment: Hypertension Control: not controlled  Progress toward goals: unchanged  Barriers to meeting goals: I am questioning medication compliance, as he admittedly is taking medications when he wants to. He also admits to not being reliably compliant with his CPAP and using it only when he feels he needs it. This could be contributing towards lack of control of his BP. I unfortunately did not see that his BP continued to be elevated until the patient left, therefore, this will need to be followed-up at his next visit.   Plan:  continue current medications  Stress compliance.  Stress compliance with CPAP as well.  Check CMET today.

## 2011-04-18 LAB — COMPREHENSIVE METABOLIC PANEL
ALT: 67 U/L — ABNORMAL HIGH (ref 0–53)
AST: 58 U/L — ABNORMAL HIGH (ref 0–37)
Albumin: 4.4 g/dL (ref 3.5–5.2)
CO2: 26 mEq/L (ref 19–32)
Calcium: 9.5 mg/dL (ref 8.4–10.5)
Chloride: 104 mEq/L (ref 96–112)
Potassium: 4.1 mEq/L (ref 3.5–5.3)
Sodium: 140 mEq/L (ref 135–145)
Total Protein: 7 g/dL (ref 6.0–8.3)

## 2011-04-18 LAB — LIPID PANEL
LDL Cholesterol: 167 mg/dL — ABNORMAL HIGH (ref 0–99)
VLDL: 35 mg/dL (ref 0–40)

## 2011-04-26 ENCOUNTER — Other Ambulatory Visit: Payer: Self-pay | Admitting: Internal Medicine

## 2011-04-26 NOTE — Progress Notes (Signed)
Addended by: Neomia Dear on: 04/26/2011 02:54 PM   Modules accepted: Orders

## 2011-05-12 ENCOUNTER — Encounter: Payer: Medicaid Other | Admitting: Internal Medicine

## 2011-06-15 ENCOUNTER — Other Ambulatory Visit: Payer: Self-pay | Admitting: Family Medicine

## 2011-06-15 ENCOUNTER — Other Ambulatory Visit: Payer: Self-pay | Admitting: Internal Medicine

## 2011-06-16 ENCOUNTER — Other Ambulatory Visit: Payer: Self-pay | Admitting: Internal Medicine

## 2011-06-16 NOTE — Telephone Encounter (Signed)
Rx called in 

## 2011-06-30 NOTE — Progress Notes (Signed)
Addended by: Neomia Dear on: 06/30/2011 04:13 PM   Modules accepted: Orders

## 2011-07-06 ENCOUNTER — Encounter: Payer: Medicaid Other | Admitting: Internal Medicine

## 2011-08-15 ENCOUNTER — Ambulatory Visit (INDEPENDENT_AMBULATORY_CARE_PROVIDER_SITE_OTHER): Payer: Medicaid Other | Admitting: Internal Medicine

## 2011-08-15 VITALS — BP 133/83 | HR 74 | Temp 97.0°F

## 2011-08-15 DIAGNOSIS — Z79899 Other long term (current) drug therapy: Secondary | ICD-10-CM

## 2011-08-15 DIAGNOSIS — E119 Type 2 diabetes mellitus without complications: Secondary | ICD-10-CM

## 2011-08-15 DIAGNOSIS — Z139 Encounter for screening, unspecified: Secondary | ICD-10-CM

## 2011-08-15 DIAGNOSIS — R42 Dizziness and giddiness: Secondary | ICD-10-CM

## 2011-08-15 DIAGNOSIS — Z8673 Personal history of transient ischemic attack (TIA), and cerebral infarction without residual deficits: Secondary | ICD-10-CM

## 2011-08-15 DIAGNOSIS — Z8679 Personal history of other diseases of the circulatory system: Secondary | ICD-10-CM

## 2011-08-15 DIAGNOSIS — I10 Essential (primary) hypertension: Secondary | ICD-10-CM

## 2011-08-15 MED ORDER — ATORVASTATIN CALCIUM 40 MG PO TABS: 40.0000 mg | ORAL_TABLET | Freq: Every day | ORAL | Status: DC

## 2011-08-15 NOTE — Patient Instructions (Signed)
Therapy after a Stroke, Adult Your brain cells need a steady supply of blood and oxygen to work normally. A stroke comes from a sudden end of blood flow to the brain. Your brain cells may start to die within minutes after blood flow is interrupted. The loss of brain function can cause problems with speech, vision, memory, or movement. The severity of the stroke depends on which part of your brain is affected and how big an area is harmed. There are two main types of stroke:  Ischemic stroke is caused by a blockage in arteries (blood vessels carrying oxygen rich blood) supplying the brain. This is the most common kind of stroke.   Hemorrhagic stroke is occurs when a blood vessel in your brain bursts and causes bleeding into your brain.  A stroke can cause many types of problems. The treatment of stroke involves 3 stages. The 3 stages are prevention, treatment immediately following a stroke, and rehabilitation after a stroke. BEFORE THERAPY BEGINS A detailed exam by your caregiver helps outline what problems were caused by the stroke. Your caregiver may ask specialists about this. The specialists may include doctors, occupational therapists, physical therapists, and speech therapists. It is then possible to make a plan that best fits your needs. Your evaluation might include the following:  Your ability to do daily activities that require using muscles, coordination, vision, reasoning, memory, and problem solving. Interviews with you and your caregiver to understand what you could do and could not do before the stroke. Your ability to do personal self-care tasks, such as dressing, grooming, and eating.   Tests may be done to see if there are sensory and motor changes due to the stroke, especially in the hands and legs.  TREATMENT   Your caregiver may start therapy right away depending on the type and severity of your stroke.   Medicines.   If a blood clot caused the stroke, you may be started on  medicines that can help to dissolve the clot and stop new clots from forming.   If bleeding caused the stroke, medicines might be used to lower blood pressure and reverse the effects of any blood thinners used at the time of the stroke.   Therapy after stroke is focused on getting function back and preventing another stroke. Therapy might include:   Physical therapy. This can include help with walking, sitting, lying down, and balance. It may also be designed to help prevent shortening of the muscles (contractures) and swelling (edema).   Occupational therapy. This therapy helps you to relearn skills needed for leading a normal life. These could include eating, using the restroom, dressing, and taking care of yourself. It helps to make you more independent.   Vision therapy. This can help you to retrain, strengthen, and improve your vision following a stroke.   Speech therapy. This can help to improve your speech and communication skills. It also teaches both you and your family members to cope with problems of being unable to communicate.   Cognitive therapy. This therapy can help with problems caused by lack of memory, attention, or concentration.   Psychological or psychiatric therapy. This can help you cope with problems of frustration and emotional problems that may develop after a stroke.   Blood pressure control. If your blood pressure is high, it is important to lower it to a normal pressure.   Smoking cessation. If you smoke, it is important to get help to stop smoking.   Medicines to lower cholesterol (  antilipidemics) are sometimes prescribed to reduce the amount of fat (plaque) in the blood. The reduction of plaque on artery walls can decrease the risk of a future stroke.  HOME CARE INSTRUCTIONS   Follow a healthy diet as directed by your caregiver.   Monitor and control your blood sugar levels, if you are a person with diabetes.   Reduce the amount of sodium and fat in your  diet.   Quit smoking.   Exercise regularly, in moderation.   Take the necessary prescribed medicines as directed by your caregiver.   Continue your rehabilitation program as directed by your caregiver.   Get your blood pressure and cholesterol levels checked regularly.  SEEK MEDICAL CARE IF:   You feel you are having problems with medicines prescribed.   You are not improving as expected.  SEEK IMMEDIATE MEDICAL CARE IF:   You have sudden weakness or numbness of the face, arm, or leg, especially on one side of the body.   You have sudden confusion.   You have trouble speaking (aphasia) or understanding.   You have sudden trouble seeing in 1 or both eyes.   You have sudden trouble walking.   You have dizziness.   You have a loss of balance or coordination.   You have a sudden severe headache with no known cause.   You have an oral temperature above 102 F (38.9 C), not controlled by medicine.   You are coughing or have difficulty breathing.   You have new chest pain, angina, or an irregular heartbeat.  ANY OF THESE SYMPTOMS MAY REPRESENT A SERIOUS PROBLEM THAT IS AN EMERGENCY. Do not wait to see if the symptoms will go away. Get medical help at once. Call your local emergency services (911 in the [place] DO NOT drive yourself to the hospital. Document Released: 03/19/2007 Document Revised: 02/16/2011 Document Reviewed: 03/19/2007 [employer] Patient Information 2012 [employer]. 

## 2011-08-16 LAB — MICROALBUMIN / CREATININE URINE RATIO: Microalb, Ur: 1.72 mg/dL (ref 0.00–1.89)

## 2011-08-16 LAB — COMPLETE METABOLIC PANEL WITH GFR
Albumin: 4.4 g/dL (ref 3.5–5.2)
Alkaline Phosphatase: 51 U/L (ref 39–117)
CO2: 23 mEq/L (ref 19–32)
Calcium: 9.8 mg/dL (ref 8.4–10.5)
Chloride: 105 mEq/L (ref 96–112)
GFR, Est Non African American: 72 mL/min
Glucose, Bld: 107 mg/dL — ABNORMAL HIGH (ref 70–99)
Potassium: 4.1 mEq/L (ref 3.5–5.3)
Sodium: 140 mEq/L (ref 135–145)
Total Protein: 7.3 g/dL (ref 6.0–8.3)

## 2011-08-17 NOTE — Assessment & Plan Note (Signed)
This is a chronic problem and related to his prior stroke. I believe that he would benefit from physical therapy assessment and management. Refer to physical therapy given today.

## 2011-08-17 NOTE — Progress Notes (Signed)
  Subjective:    Patient ID: Mark Cabrera, male    DOB: 31-Jul-1950, 61 y.o.   MRN: [id number]  HPI  Patient is a 61 year old man with past medical history most significant for posterior circulation stroke, diabetes, hypertension and hyperlipidemia.  Patient comes in today for routine followup but also has few complaints.  #1 dizziness: Patient feels that every time he gets up from a sitting position he feels as if he is going to lose his balance. The situation has not improved ever since his stroke. This complaint has not become worse in recent months but patient feels that it interferes significantly with his activities of daily living. The dizziness is described as not a sensation of room spinning but in fact problem with his balance.  #2 Patient feels that he has had changes in the color of his skin of his arms recently.   Review of Systems  Constitutional: Negative for fever, activity change and appetite change.  HENT: Negative for sore throat.   Respiratory: Negative for cough and shortness of breath.   Cardiovascular: Negative for chest pain and leg swelling.  Gastrointestinal: Negative for nausea, abdominal pain, diarrhea, constipation and abdominal distention.  Genitourinary: Negative for frequency, hematuria and difficulty urinating.  Neurological: Negative for dizziness and headaches.  Psychiatric/Behavioral: Negative for suicidal ideas and behavioral problems.       Objective:   Physical Exam  Constitutional: He is oriented to person, place, and time. He appears well-developed and well-nourished.  HENT:  Head: Normocephalic and atraumatic.  Eyes: Conjunctivae and EOM are normal. Pupils are equal, round, and reactive to light. No scleral icterus.  Neck: Normal range of motion. Neck supple. No JVD present. No thyromegaly present.  Cardiovascular: Normal rate, regular rhythm, normal heart sounds and intact distal pulses.  Exam reveals no gallop and no friction rub.   No murmur  heard. Pulmonary/Chest: Effort normal and breath sounds normal. No respiratory distress. He has no wheezes. He has no rales.  Abdominal: Soft. Bowel sounds are normal. He exhibits no distension and no mass. There is no tenderness. There is no rebound and no guarding.  Musculoskeletal: Normal range of motion. He exhibits no edema and no tenderness.  Lymphadenopathy:    He has no cervical adenopathy.  Neurological: He is alert and oriented to person, place, and time.       No nystagmus noted. Romberg sign is positive. Patient has decreased sensation in the left arm which he has had that since his stroke.   Psychiatric: He has a normal mood and affect. His behavior is normal.          Assessment & Plan:

## 2011-08-17 NOTE — Assessment & Plan Note (Addendum)
Patient would benefit from few visits to physical therapy. I think she needs education about how to compensate for his balance problem. Followup in 2-3 months after completion of physical therapy to assess response. Patient is already on aspirin 325 for secondary prevention of stroke. I believe patient's perceived skin changes are related to deficits from his chronic stroke.

## 2011-08-17 NOTE — Assessment & Plan Note (Addendum)
Patient's HbA1c 7.4 today. Patient is due for an eye exam. Patient is due for microalbumin creatinine ratio. Patient is already on enalapril. Patient is on aspirin and statin. Foot exam done today. No new changes to medication. Patient advised to lose weight.

## 2011-08-17 NOTE — Assessment & Plan Note (Signed)
I will check basic metabolic profile for potassium and creatinine. No changes made to the medication listed. Patient's blood pressure is well-controlled on current medications.

## 2011-09-18 ENCOUNTER — Telehealth: Payer: Self-pay | Admitting: *Deleted

## 2011-09-18 NOTE — Addendum Note (Signed)
Addended by: Neomia Dear on: 09/18/2011 02:46 PM   Modules accepted: Orders

## 2011-09-19 NOTE — Telephone Encounter (Signed)
Encounter opened in error. Dorie Rank, RN, 09/19/2011, 11:18A

## 2011-10-03 NOTE — Addendum Note (Signed)
Addended by: Neomia Dear on: 10/03/2011 05:35 AM   Modules accepted: Orders

## 2011-10-24 ENCOUNTER — Encounter: Payer: Self-pay | Admitting: Internal Medicine

## 2011-11-07 ENCOUNTER — Encounter: Payer: Self-pay | Admitting: Internal Medicine

## 2011-11-07 ENCOUNTER — Ambulatory Visit (INDEPENDENT_AMBULATORY_CARE_PROVIDER_SITE_OTHER): Payer: Medicaid Other | Admitting: Internal Medicine

## 2011-11-07 VITALS — BP 147/88 | HR 71 | Temp 97.4°F | Ht 68.0 in | Wt 234.8 lb

## 2011-11-07 DIAGNOSIS — L989 Disorder of the skin and subcutaneous tissue, unspecified: Secondary | ICD-10-CM

## 2011-11-07 DIAGNOSIS — R21 Rash and other nonspecific skin eruption: Secondary | ICD-10-CM

## 2011-11-07 NOTE — Patient Instructions (Signed)
You were seen for your rash and we will try to get you to a dermatologist. Continue taking your medications. Come back as needed. Go get your eyes checked especially for signs of diabetes in your eyes. Our number is [phone].

## 2011-11-07 NOTE — Progress Notes (Signed)
Subjective:     Patient ID: Mark Cabrera, male   DOB: [birth year], 61 y.o.   MRN: [id number]  HPI The patient is a 61 year old male who has past medical history of diabetes, hyperlipidemia, history of stroke, osteoarthritis. He comes in today for a rash on his scalp which he states has been going on off and on for many years. It seems that he has periods of time when he gets these bumps that pop up and then he pops. He states that a slightly bloody watery fluid comes out of them. He doesn't think it looks like pus. He states that they come up for no reason and then they disappear within several days. He states that generally when he goes to the doctor they aren't present. He doesn't have any fevers or chills at home. No new detergents or clothes or irritants that he's noticed. No recent falls, abrasions to the head. These do not itch. He also states that he has a very on his back which has been checked out by dermatologist in the past and was told it was not cancerous however is now begun to be irritated when he sits.  Review of Systems  Constitutional: Negative.   HENT: Negative.   Eyes: Negative.   Respiratory: Negative.  Negative for cough, chest tightness, shortness of breath and wheezing.   Cardiovascular: Negative.  Negative for chest pain, palpitations and leg swelling.  Gastrointestinal: Negative.   Musculoskeletal: Positive for back pain. Negative for myalgias, joint swelling, arthralgias and gait problem.       From skin lesion  Skin: Positive for rash and wound. Negative for color change and pallor.  Neurological: Positive for dizziness. Negative for tremors, seizures, syncope, facial asymmetry, speech difficulty, weakness, light-headedness, numbness and headaches.       No falls, chronic since stroke. Unchanged.       Objective:   Physical Exam  Constitutional: He is oriented to person, place, and time. He appears well-developed and well-nourished. No distress.  HENT:  Head:  Normocephalic and atraumatic.       Approximately two lesions that are red and scabbed over, flat, 2 mm in diameter, no drainage or fluctuance.   Eyes: EOM are normal. Pupils are equal, round, and reactive to light.  Neck: Normal range of motion. Neck supple.  Cardiovascular: Normal rate and regular rhythm.   Pulmonary/Chest: Effort normal and breath sounds normal.  Abdominal: Soft. Bowel sounds are normal.  Musculoskeletal: Normal range of motion.  Neurological: He is alert and oriented to person, place, and time. No cranial nerve deficit.  Skin: Skin is warm and dry.       Several skin tags on his back, one is irritated and red with small tear in the skin.   Psychiatric: He has a normal mood and affect. Judgment and thought content normal.       Assessment/Plan:   1. Rash on scalp-the patient does have 2 areas on his scalp which are approximately 2 mm in diameter that are scabbed over. There is no expressed drainage. Slightly tender to palpation. Not fluctuant. Will refer to dermatology for further workup. Unclear etiology at this time. Not acutely infected. No role for antibiotics at this time.  2. Lesion on back- this does appear to be a skin tag. He does appear to be slightly reddened around the edges as if it's been irritated. Slight break in the skin indicating there may have been trauma or itching to this area which is causing it to  be aerated when he went back against it. Does not appear to be cancerous, is not growing per patient. Would recommend removal if it irritates the patient which is. Would recommend dermatology to remove at the same time they're seeing him for the rash on his scalp.  3. Disposition-the patient will go to see dermatology. No changes to his medications at today's visit no medications prescribed at today's visit. He will come back as needed when necessary.

## 2012-01-10 ENCOUNTER — Encounter: Payer: Self-pay | Admitting: Internal Medicine

## 2012-01-10 NOTE — Progress Notes (Signed)
  This patient is a CHRONIC NO-SHOW PATIENT that has a history of HYPERTENSION.  Please make sure to address hypertension during his next clinic appointment, and intervene as appropriate.    Within the AVS, please incorporate the following smartphrase: .htntips   Pertinent Data: BP Readings from Last 3 Encounters:  11/07/11 147/88  08/15/11 133/83  04/17/11 155/88    BMI: Estimated Body mass index is 35.70 kg/(m^2) as calculated from the following:   Height as of 11/07/11: 5\' 8" (1.727 m).   Weight as of 11/07/11: 234 lb 12.8 oz(106.505 kg).  Smoking History: History  Smoking status  . Never Smoker   Smokeless tobacco  . Not on file    Last Basic Metabolic Panel:    Component Value Date/Time   NA 140 08/15/2011 1601   K 4.1 08/15/2011 1601   CL 105 08/15/2011 1601   CO2 23 08/15/2011 1601   BUN 18 08/15/2011 1601   CREATININE 1.09 08/15/2011 1601   CREATININE 0.94 08/31/2009 1819   GLUCOSE 107* 08/15/2011 1601   CALCIUM 9.8 08/15/2011 1601    Allergies: Allergies  Allergen Reactions  . Penicillins

## 2012-05-27 ENCOUNTER — Other Ambulatory Visit: Payer: Self-pay | Admitting: Internal Medicine

## 2012-06-11 ENCOUNTER — Other Ambulatory Visit (HOSPITAL_COMMUNITY): Payer: Self-pay | Admitting: Internal Medicine

## 2012-06-11 DIAGNOSIS — I6789 Other cerebrovascular disease: Secondary | ICD-10-CM

## 2012-06-12 ENCOUNTER — Other Ambulatory Visit (HOSPITAL_COMMUNITY): Payer: Self-pay | Admitting: Internal Medicine

## 2012-06-13 ENCOUNTER — Ambulatory Visit (HOSPITAL_COMMUNITY)
Admission: RE | Admit: 2012-06-13 | Discharge: 2012-06-13 | Disposition: A | Discharge status: Home / Self Care | Payer: Medicaid Other | Source: Ambulatory Visit | Attending: Internal Medicine | Admitting: Internal Medicine

## 2012-06-13 DIAGNOSIS — E119 Type 2 diabetes mellitus without complications: Secondary | ICD-10-CM | POA: Insufficient documentation

## 2012-06-13 DIAGNOSIS — IMO0002 Reserved for concepts with insufficient information to code with codable children: Secondary | ICD-10-CM

## 2012-06-13 DIAGNOSIS — R209 Unspecified disturbances of skin sensation: Secondary | ICD-10-CM | POA: Insufficient documentation

## 2012-06-13 DIAGNOSIS — I69998 Other sequelae following unspecified cerebrovascular disease: Secondary | ICD-10-CM | POA: Insufficient documentation

## 2012-06-13 DIAGNOSIS — E785 Hyperlipidemia, unspecified: Secondary | ICD-10-CM | POA: Insufficient documentation

## 2012-06-13 DIAGNOSIS — R29898 Other symptoms and signs involving the musculoskeletal system: Secondary | ICD-10-CM | POA: Insufficient documentation

## 2012-06-13 DIAGNOSIS — I6789 Other cerebrovascular disease: Secondary | ICD-10-CM | POA: Insufficient documentation

## 2012-06-13 DIAGNOSIS — R0989 Other specified symptoms and signs involving the circulatory and respiratory systems: Secondary | ICD-10-CM

## 2012-06-14 ENCOUNTER — Other Ambulatory Visit (HOSPITAL_COMMUNITY): Payer: Self-pay | Admitting: Internal Medicine

## 2012-06-14 DIAGNOSIS — IMO0002 Reserved for concepts with insufficient information to code with codable children: Secondary | ICD-10-CM

## 2012-06-14 NOTE — Progress Notes (Signed)
Bilateral carotid artery duplex:  No evidence of hemodynamically significant internal carotid artery stenosis.   Vertebral artery flow is antegrade.     

## 2012-06-17 ENCOUNTER — Ambulatory Visit: Payer: Medicaid Other | Attending: Internal Medicine | Admitting: Physical Therapy

## 2012-06-17 DIAGNOSIS — IMO0001 Reserved for inherently not codable concepts without codable children: Secondary | ICD-10-CM | POA: Insufficient documentation

## 2012-06-17 DIAGNOSIS — R42 Dizziness and giddiness: Secondary | ICD-10-CM | POA: Insufficient documentation

## 2012-06-24 ENCOUNTER — Other Ambulatory Visit: Payer: Self-pay | Admitting: Internal Medicine

## 2012-07-02 ENCOUNTER — Ambulatory Visit: Payer: Medicaid Other | Admitting: Physical Therapy

## 2012-07-09 ENCOUNTER — Other Ambulatory Visit (HOSPITAL_COMMUNITY): Payer: Self-pay | Admitting: Internal Medicine

## 2012-07-09 ENCOUNTER — Ambulatory Visit: Payer: Medicaid Other | Admitting: Physical Therapy

## 2012-07-09 DIAGNOSIS — I998 Other disorder of circulatory system: Secondary | ICD-10-CM

## 2012-07-09 DIAGNOSIS — I639 Cerebral infarction, unspecified: Secondary | ICD-10-CM

## 2012-07-12 ENCOUNTER — Ambulatory Visit (HOSPITAL_COMMUNITY)
Admission: RE | Admit: 2012-07-12 | Discharge: 2012-07-12 | Disposition: A | Discharge status: Home / Self Care | Payer: Medicaid Other | Source: Ambulatory Visit | Attending: Internal Medicine | Admitting: Internal Medicine

## 2012-07-12 DIAGNOSIS — I6350 Cerebral infarction due to unspecified occlusion or stenosis of unspecified cerebral artery: Secondary | ICD-10-CM | POA: Insufficient documentation

## 2012-07-12 DIAGNOSIS — I639 Cerebral infarction, unspecified: Secondary | ICD-10-CM

## 2012-07-12 DIAGNOSIS — I359 Nonrheumatic aortic valve disorder, unspecified: Secondary | ICD-10-CM

## 2012-07-12 NOTE — OR Nursing (Signed)
0.9%NS 9cc, 1 cc air agitated and injected x 3 into piv during echo.

## 2012-07-12 NOTE — Progress Notes (Signed)
  Echocardiogram 2D Echocardiogram has been performed.  Mark Cabrera 07/12/2012, 3:07 PM

## 2012-07-16 ENCOUNTER — Ambulatory Visit: Payer: Medicaid Other | Admitting: Physical Therapy

## 2012-08-27 ENCOUNTER — Other Ambulatory Visit: Payer: Self-pay | Admitting: Internal Medicine

## 2012-09-03 NOTE — Telephone Encounter (Signed)
Medication request to refill lipitor

## 2012-09-18 ENCOUNTER — Other Ambulatory Visit: Payer: Self-pay | Admitting: Internal Medicine

## 2012-09-18 NOTE — Telephone Encounter (Signed)
Needs new script sent to pharm for lipitor

## 2012-09-19 ENCOUNTER — Other Ambulatory Visit: Payer: Self-pay

## 2012-10-09 ENCOUNTER — Encounter: Payer: Self-pay | Admitting: Internal Medicine

## 2012-10-17 ENCOUNTER — Emergency Department (HOSPITAL_COMMUNITY)
Admission: EM | Admit: 2012-10-17 | Discharge: 2012-10-17 | Disposition: A | Discharge status: Home / Self Care | Payer: Medicaid Other | Attending: Emergency Medicine | Admitting: Emergency Medicine

## 2012-10-17 ENCOUNTER — Ambulatory Visit (INDEPENDENT_AMBULATORY_CARE_PROVIDER_SITE_OTHER): Payer: Medicaid Other | Admitting: Internal Medicine

## 2012-10-17 ENCOUNTER — Emergency Department (HOSPITAL_COMMUNITY): Payer: Medicaid Other

## 2012-10-17 ENCOUNTER — Encounter: Payer: Self-pay | Admitting: Internal Medicine

## 2012-10-17 VITALS — BP 137/82 | HR 75 | Temp 96.8°F | Ht 68.0 in | Wt 228.7 lb

## 2012-10-17 DIAGNOSIS — Z880 Allergy status to penicillin: Secondary | ICD-10-CM | POA: Insufficient documentation

## 2012-10-17 DIAGNOSIS — Z8679 Personal history of other diseases of the circulatory system: Secondary | ICD-10-CM | POA: Insufficient documentation

## 2012-10-17 DIAGNOSIS — Z87442 Personal history of urinary calculi: Secondary | ICD-10-CM | POA: Insufficient documentation

## 2012-10-17 DIAGNOSIS — E119 Type 2 diabetes mellitus without complications: Secondary | ICD-10-CM

## 2012-10-17 DIAGNOSIS — S060X9A Concussion with loss of consciousness of unspecified duration, initial encounter: Secondary | ICD-10-CM | POA: Insufficient documentation

## 2012-10-17 DIAGNOSIS — Z8719 Personal history of other diseases of the digestive system: Secondary | ICD-10-CM | POA: Insufficient documentation

## 2012-10-17 DIAGNOSIS — S060X0A Concussion without loss of consciousness, initial encounter: Secondary | ICD-10-CM

## 2012-10-17 DIAGNOSIS — M129 Arthropathy, unspecified: Secondary | ICD-10-CM | POA: Insufficient documentation

## 2012-10-17 DIAGNOSIS — Y9389 Activity, other specified: Secondary | ICD-10-CM | POA: Insufficient documentation

## 2012-10-17 DIAGNOSIS — H538 Other visual disturbances: Secondary | ICD-10-CM | POA: Insufficient documentation

## 2012-10-17 DIAGNOSIS — S0990XA Unspecified injury of head, initial encounter: Secondary | ICD-10-CM | POA: Insufficient documentation

## 2012-10-17 DIAGNOSIS — R404 Transient alteration of awareness: Secondary | ICD-10-CM | POA: Insufficient documentation

## 2012-10-17 DIAGNOSIS — S060XAA Concussion with loss of consciousness status unknown, initial encounter: Secondary | ICD-10-CM | POA: Insufficient documentation

## 2012-10-17 DIAGNOSIS — I10 Essential (primary) hypertension: Secondary | ICD-10-CM | POA: Insufficient documentation

## 2012-10-17 DIAGNOSIS — E785 Hyperlipidemia, unspecified: Secondary | ICD-10-CM | POA: Insufficient documentation

## 2012-10-17 MED ORDER — ASPIRIN EC 81 MG PO TBEC: 81.0000 mg | DELAYED_RELEASE_TABLET | Freq: Every day | ORAL | Status: AC
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
## 2012-10-17 NOTE — Patient Instructions (Addendum)
-  Go to the Emergency Department as soon as possible to be evaluated for possible subdural hematoma or skull fracture.  -Follow up with [place] for your diabetes and hypertension by the end of this month.

## 2012-10-19 NOTE — Assessment & Plan Note (Addendum)
Pt with blunt trauma to the posterior head with no skin tear with LOC for 1-2 seconds. Indentation could be secondary to normal variation os skull (perhaps even from lambdoid suture line fusion).  Given his blurry vision, hx of LOC, change in gate, and headache this warrants further evaluation for a subdural hematoma.  The patient was sent to the Emergency Department for further evaluation to include CT head.  Pt instructed to follow up with [place] by the end of this month for chronic medical problems or as needed for acute issues.

## 2012-10-21 NOTE — Progress Notes (Signed)
Case discussed with Dr. Kennerly soon after the resident saw the patient.  We reviewed the resident's history and exam and pertinent patient test results.  I agree with the assessment, diagnosis, and plan of care documented in the resident's note. 

## 2012-10-24 ENCOUNTER — Ambulatory Visit: Payer: Medicaid Other | Admitting: Internal Medicine

## 2012-10-25 ENCOUNTER — Telehealth: Payer: Self-pay | Admitting: Licensed Clinical Social Worker

## 2012-10-25 NOTE — Telephone Encounter (Signed)
Mr. Vandeberg was referred to CSW for transportation assistance.  Pt has medicaid and should be eligible for medicaid medical transportation.  CSW placed call to [facility] and completed referral. Pt should be notified by 10/28/12 5pm at the latest.  Pt also submitted change of enrollment form for [place] [employer].  CSW placed call to Mr. Cassatt.  Left message notifying pt of above in addition, pt notified of scheduled appt.  CSW will send letter out.

## 2012-11-06 ENCOUNTER — Ambulatory Visit: Payer: Medicaid Other | Admitting: Internal Medicine

## 2012-11-16 ENCOUNTER — Other Ambulatory Visit: Payer: Self-pay | Admitting: Internal Medicine

## 2012-11-25 NOTE — Telephone Encounter (Signed)
Pt is using a medication for scalp irritation, sores on top of head.  Dermatologist Sanda Linger )ordered Clobetasol - propionate topical solution 0.05% ( apply to scaly daily as needed )/per patient. Pt asking for a refill on this solution.  He is not able to get in touch with Dermatologist.   Pt will be out of BP meds tomorrow

## 2012-11-27 NOTE — Telephone Encounter (Signed)
Please address request for scalp medication.

## 2013-01-06 ENCOUNTER — Other Ambulatory Visit: Payer: Self-pay | Admitting: *Deleted

## 2013-01-06 DIAGNOSIS — I10 Essential (primary) hypertension: Secondary | ICD-10-CM

## 2013-01-08 MED ORDER — ENALAPRIL-HYDROCHLOROTHIAZIDE 10-25 MG PO TABS: 1.0000 | ORAL_TABLET | Freq: Every day | ORAL | Status: DC

## 2013-01-08 NOTE — Telephone Encounter (Signed)
Pt has transportation problems; unable to keep appt for tomorrow; re-scheduled for 12/19. States he called 2 weeks ago for refill and now he's completely out.

## 2013-01-08 NOTE — Telephone Encounter (Signed)
What medicines did Mr. Steinborn need refilled?

## 2013-01-09 ENCOUNTER — Ambulatory Visit: Payer: Medicaid Other | Admitting: Internal Medicine

## 2013-01-14 ENCOUNTER — Ambulatory Visit: Payer: Medicaid Other | Admitting: Internal Medicine

## 2013-01-31 ENCOUNTER — Encounter: Payer: Medicaid Other | Admitting: Internal Medicine

## 2013-02-28 ENCOUNTER — Encounter: Payer: Medicaid Other | Admitting: Internal Medicine

## 2013-04-25 ENCOUNTER — Encounter: Payer: Self-pay | Admitting: Internal Medicine

## 2013-04-25 ENCOUNTER — Encounter: Payer: Medicaid Other | Admitting: Dietician

## 2013-04-25 ENCOUNTER — Ambulatory Visit (INDEPENDENT_AMBULATORY_CARE_PROVIDER_SITE_OTHER): Payer: Medicaid Other | Admitting: Internal Medicine

## 2013-04-25 VITALS — BP 114/73 | HR 68 | Temp 97.0°F | Ht 68.0 in | Wt 228.1 lb

## 2013-04-25 DIAGNOSIS — G4730 Sleep apnea, unspecified: Secondary | ICD-10-CM

## 2013-04-25 DIAGNOSIS — I10 Essential (primary) hypertension: Secondary | ICD-10-CM

## 2013-04-25 DIAGNOSIS — Z0000 Encounter for general adult medical examination without abnormal findings: Secondary | ICD-10-CM

## 2013-04-25 DIAGNOSIS — E785 Hyperlipidemia, unspecified: Secondary | ICD-10-CM

## 2013-04-25 DIAGNOSIS — E1165 Type 2 diabetes mellitus with hyperglycemia: Secondary | ICD-10-CM

## 2013-04-25 DIAGNOSIS — G609 Hereditary and idiopathic neuropathy, unspecified: Secondary | ICD-10-CM

## 2013-04-25 DIAGNOSIS — K219 Gastro-esophageal reflux disease without esophagitis: Secondary | ICD-10-CM

## 2013-04-25 DIAGNOSIS — E119 Type 2 diabetes mellitus without complications: Secondary | ICD-10-CM

## 2013-04-25 DIAGNOSIS — IMO0001 Reserved for inherently not codable concepts without codable children: Secondary | ICD-10-CM

## 2013-04-25 LAB — HM DIABETES EYE EXAM

## 2013-04-25 LAB — LIPID PANEL
CHOL/HDL RATIO: 4.4 ratio
CHOLESTEROL: 206 mg/dL — AB (ref 0–200)
HDL: 47 mg/dL (ref >39)
LDL Cholesterol: 111 mg/dL — ABNORMAL HIGH (ref 0–99)
Triglycerides: 241 mg/dL — ABNORMAL HIGH (ref <150)
VLDL: 48 mg/dL — ABNORMAL HIGH (ref 0–40)

## 2013-04-25 LAB — BASIC METABOLIC PANEL WITH GFR
BUN: 11 mg/dL (ref 6–23)
CHLORIDE: 97 meq/L (ref 96–112)
CO2: 27 mEq/L (ref 19–32)
Calcium: 9.1 mg/dL (ref 8.4–10.5)
Creat: 0.84 mg/dL (ref 0.50–1.35)
GLUCOSE: 284 mg/dL — AB (ref 70–99)
POTASSIUM: 3.9 meq/L (ref 3.5–5.3)
SODIUM: 136 meq/L (ref 135–145)

## 2013-04-25 LAB — GLUCOSE, CAPILLARY: GLUCOSE-CAPILLARY: 270 mg/dL — AB (ref 70–99)

## 2013-04-25 LAB — POCT GLYCOSYLATED HEMOGLOBIN (HGB A1C): HEMOGLOBIN A1C: 8.9

## 2013-04-25 MED ORDER — GABAPENTIN 300 MG PO CAPS: 300.0000 mg | ORAL_CAPSULE | Freq: Three times a day (TID) | ORAL | Status: DC

## 2013-04-25 MED ORDER — OMEPRAZOLE 20 MG PO CPDR: 20.0000 mg | DELAYED_RELEASE_CAPSULE | Freq: Every day | ORAL | Status: DC

## 2013-04-25 MED ORDER — DOCUSATE SODIUM 100 MG PO CAPS: 100.0000 mg | ORAL_CAPSULE | Freq: Every day | ORAL | Status: AC | PRN

## 2013-04-25 NOTE — Assessment & Plan Note (Signed)
Refused flu shot.   Retinal scan today.  Referral for colonoscopy today (he has never had one).

## 2013-04-25 NOTE — Patient Instructions (Addendum)
Follow-up with Dr. Stann Mainland in 1 month and [facility] in 1-2 weeks.  Keep working on taking you medicines as prescribed.  IT IS VERY IMPORTANT THAT YOU REMEMBER TO TAKE METFORMIN TWICE DAILY.  We also sent in prescriptions for OMEPRAZOLE (for stomach acid), GABAPENTIN (for nerve pain), and COLACE (for constipation).   We are referring you for colonoscopy and also checked your eyes today.   We checked several blood tests today, I will let you know if the results are abnormal.  You would likely benefit from a cholesterol medicine, but we will talk about this again at your next visit.    Diabetes Meal Planning Guide The diabetes meal planning guide is a tool to help you plan your meals and snacks. It is important for people with diabetes to manage their blood glucose (sugar) levels. Choosing the right foods and the right amounts throughout your day will help control your blood glucose. Eating right can even help you improve your blood pressure and reach or maintain a healthy weight. CARBOHYDRATE COUNTING MADE EASY When you eat carbohydrates, they turn to sugar. This raises your blood glucose level. Counting carbohydrates can help you control this level so you feel better. When you plan your meals by counting carbohydrates, you can have more flexibility in what you eat and balance your medicine with your food intake. Carbohydrate counting simply means adding up the total amount of carbohydrate grams in your meals and snacks. Try to eat about the same amount at each meal. Foods with carbohydrates are listed below. Each portion below is 1 carbohydrate serving or 15 grams of carbohydrates. Ask your dietician how many grams of carbohydrates you should eat at each meal or snack. Grains and Starches  1 slice bread.   English muffin or hotdog/hamburger bun.   cup cold cereal (unsweetened).   cup cooked pasta or rice.   cup starchy vegetables (corn, potatoes, peas, beans, winter squash).  1 tortilla  (6 inches).   bagel.  1 waffle or pancake (size of a CD).   cup cooked cereal.  4 to 6 small crackers. *Whole grain is recommended. Fruit  1 cup fresh unsweetened berries, melon, papaya, pineapple.  1 small fresh fruit.   banana or mango.   cup fruit juice (4 oz unsweetened).   cup canned fruit in natural juice or water.  2 tbs dried fruit.  12 to 15 grapes or cherries. Milk and Yogurt  1 cup fat-free or 1% milk.  1 cup soy milk.  6 oz light yogurt with sugar-free sweetener.  6 oz low-fat soy yogurt.  6 oz plain yogurt. Vegetables  1 cup raw or  cup cooked is counted as 0 carbohydrates or a "free" food.  If you eat 3 or more servings at 1 meal, count them as 1 carbohydrate serving. Other Carbohydrates   oz chips or pretzels.   cup ice cream or frozen yogurt.   cup sherbet or sorbet.  2 inch square cake, no frosting.  1 tbs honey, sugar, jam, jelly, or syrup.  2 small cookies.  3 squares of graham crackers.  3 cups popcorn.  6 crackers.  1 cup broth-based soup.  Count 1 cup casserole or other mixed foods as 2 carbohydrate servings.  Foods with less than 20 calories in a serving may be counted as 0 carbohydrates or a "free" food. You may want to purchase a book or computer software that lists the carbohydrate gram counts of different foods. In addition, the nutrition facts  panel on the labels of the foods you eat are a good source of this information. The label will tell you how big the serving size is and the total number of carbohydrate grams you will be eating per serving. Divide this number by 15 to obtain the number of carbohydrate servings in a portion. Remember, 1 carbohydrate serving equals 15 grams of carbohydrate. SERVING SIZES Measuring foods and serving sizes helps you make sure you are getting the right amount of food. The list below tells how big or small some common serving sizes are.  1 oz.........4 stacked dice.  3  oz........Marland KitchenDeck of cards.  1 tsp.......Marland KitchenTip of little finger.  1 tbs......Marland KitchenMarland KitchenThumb.  2 tbs.......Marland KitchenGolf ball.   cup......Marland KitchenHalf of a fist.  1 cup.......Marland KitchenA fist. SAMPLE DIABETES MEAL PLAN Below is a sample meal plan that includes foods from the grain and starches, dairy, vegetable, fruit, and meat groups. A dietician can individualize a meal plan to fit your calorie needs and tell you the number of servings needed from each food group. However, controlling the total amount of carbohydrates in your meal or snack is more important than making sure you include all of the food groups at every meal. You may interchange carbohydrate containing foods (dairy, starches, and fruits). The meal plan below is an example of a 2000 calorie diet using carbohydrate counting. This meal plan has 17 carbohydrate servings. Breakfast  1 cup oatmeal (2 carb servings).   cup light yogurt (1 carb serving).  1 cup blueberries (1 carb serving).   cup almonds. Snack  1 large apple (2 carb servings).  1 low-fat string cheese stick. Lunch  Chicken breast salad.  1 cup spinach.   cup chopped tomatoes.  2 oz chicken breast, sliced.  2 tbs low-fat [place] dressing.  12 whole-wheat crackers (2 carb servings).  12 to 15 grapes (1 carb serving).  1 cup low-fat milk (1 carb serving). Snack  1 cup carrots.   cup hummus (1 carb serving). Dinner  3 oz broiled salmon.  1 cup Ziehm rice (3 carb servings). Snack  1  cups steamed broccoli (1 carb serving) drizzled with 1 tsp olive oil and lemon juice.  1 cup light pudding (2 carb servings). DIABETES MEAL PLANNING WORKSHEET Your dietician can use this worksheet to help you decide how many servings of foods and what types of foods are right for you.  BREAKFAST Food Group and Servings / Carb Servings Grain/Starches __________________________________ Dairy __________________________________________ Vegetable  ______________________________________ Fruit ___________________________________________ Meat __________________________________________ Fat ____________________________________________ LUNCH Food Group and Servings / Carb Servings Grain/Starches ___________________________________ Dairy ___________________________________________ Fruit ____________________________________________ Meat ___________________________________________ Fat _____________________________________________ W[employer] Food Group and Servings / Carb Servings Grain/Starches ___________________________________ Dairy ___________________________________________ Fruit ____________________________________________ Meat ___________________________________________ Fat _____________________________________________ SNACKS Food Group and Servings / Carb Servings Grain/Starches ___________________________________ Dairy ___________________________________________ Vegetable _______________________________________ Fruit ____________________________________________ Meat ___________________________________________ Fat _____________________________________________ DAILY TOTALS Starches _________________________ Vegetable ________________________ Fruit ____________________________ Dairy ____________________________ Meat ____________________________ Fat ______________________________ Document Released: 11/24/2004 Document Revised: 05/22/2011 Document Reviewed: 10/05/2008 [employer] Patient Information 2014 [employer]

## 2013-04-25 NOTE — Progress Notes (Signed)
Patient ID: Mark Cabrera, male   DOB: [birth year], 63 y.o.   MRN: [id number]   Subjective:   Patient ID: Mark Cabrera male   DOB: August 19, 1950 63 y.o.   MRN: [id number]  HPI: Mark Cabrera is a 63 y.o. man with history of HTN, DM2 (A1C 7.4% in 08/2011) c/b peripheral neuropathy, HL, CVA, GERD, OA, OSA who presents for routine diabetes/hypertnsion follow-up.   Patient states he is doing well overall.  He reports intermittent medication compliance, stating that he almost always misses his evening dose of metformin and doesn't take it at all for about a week each month.  He also hasn't taken statin in years or used CPAP in years.   He complains of chest burning after eating, worse at night when he lies down soon after eating.  He has taken Tums which provide some relief.  No difficulty swallowing or feeling that food gets stuck, no hematemesis.    He also complains that the right side of his body has had abnormal sensation (much more sensitive than the left side) since his stroke several years ago.  States that he gets burning sensation in his legs, and he always feels like his right foot is colder than his left (not to touch, just the sensation).    Past Medical History  Diagnosis Date  . 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 2-D echo was normal no embolic source found.  . Osteoarthritis, knee   . Sleep apnea      Central apnea. Occasionally wears his CPAP at night.  . Atypical nevi   . GERD (gastroesophageal reflux disease)   . Diabetes mellitus   . Transaminitis      Statin-induced  . Hypertension   . Hyperlipidemia     hx of transaminitis secondary to statin and he has decided not to use statins secondary to  potential side effects.  . Nephrolithiasis    Current Outpatient Prescriptions  Medication Sig Dispense Refill  . aspirin EC 81 MG tablet Take 1 tablet (81 mg total) by mouth daily.  150 tablet  2  . enalapril-hydrochlorothiazide (VASERETIC) 10-25 MG per tablet Take 1 tablet by mouth daily.  30 tablet  11  . MAGNESIUM PO Take 1 tablet by mouth as needed (takes usually every couple of weeks for leg cramps).      . metFORMIN (GLUCOPHAGE) 1000 MG tablet Take 1,000 mg by mouth 2 (two) times daily with a meal.      . POTASSIUM PO Take 2 tablets by mouth daily as needed (for leg cramps).       . docusate sodium (COLACE) 100 MG capsule Take 1 capsule (100 mg total) by mouth daily as needed.  30 capsule  1  . gabapentin (NEURONTIN) 300 MG capsule Take 1 capsule (300 mg total) by mouth 3 (three) times daily.  90 capsule  1  . omeprazole (PRILOSEC) 20 MG capsule Take 1 capsule (20 mg total) by mouth daily.  30 capsule  1   No current facility-administered medications for this visit.   Family History  Problem Relation Age of Onset  . Hypertension Mother   . Diabetes Mother   . Alcohol abuse Father    History   Social  History  . Marital Status: Divorced    Spouse Name: N/A    Number of Children: 1  . Years of Education: 2y college   Occupational History  .  Medicaid /disability     following stroke in 2007   Social History Main Topics  . Smoking status: Never Smoker   . Smokeless tobacco: None  . Alcohol Use: Yes     Comment: occasional  . Drug Use: No  . Sexual Activity: None   Other Topics Concern  . None   Social History Narrative   Single but has a girlfriend.    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       Patient has medications disability post stroke.            Review of Systems: Review of Systems  Constitutional: Negative for fever.  Eyes: Negative for blurred vision.  Respiratory: Negative for cough and shortness of breath.     Cardiovascular: Negative for chest pain and leg swelling.  Gastrointestinal: Positive for heartburn. Negative for nausea, vomiting, abdominal pain, diarrhea and constipation.  Musculoskeletal: Negative for falls.  Neurological: Positive for sensory change. Negative for dizziness, loss of consciousness and headaches.       See HPI, increased sensations in right side of body since stroke in 2007    Objective:  Physical Exam: Filed Vitals:   04/25/13 1527  BP: 114/73  Pulse: 68  Temp: 97 F (36.1 C)  TempSrc: Oral  Height: 5\' 8"  (1.727 m)  Weight: 228 lb 1.6 oz (103.465 kg)  SpO2: 99%    General: alert, cooperative, and in no apparent distress HEENT: NCAT, vision grossly intact, oropharynx clear and non-erythematous  Neck: supple, no lymphadenopathy Lungs: clear to ascultation bilaterally, normal work of respiration, no wheezes, rales, ronchi Heart: 2-5/0 systolic murmur c/w AS, regular rate and rhythm, no gallops or rubs Abdomen: soft, non-tender, non-distended, normal bowel sounds Extremities: 2+ DP/PT pulses bilaterally, no cyanosis, clubbing, or edema Neurologic: alert & oriented X3, cranial nerves II-XII intact, strength grossly intact, sensation intact to light touch  Assessment & Plan:  Patient discussed with Dr. Gwynne Edinger.  Please see problem-based assessment and plan.

## 2013-04-25 NOTE — Assessment & Plan Note (Signed)
Patient complaining of GERD symptoms. Started omeprazole 20 mg daily, follow-up in 1 month.

## 2013-04-25 NOTE — Assessment & Plan Note (Signed)
BP Readings from Last 3 Encounters:  04/25/13 114/73  10/17/12 122/71  10/17/12 137/82    Lab Results  Component Value Date   NA 140 08/15/2011   K 4.1 08/15/2011   CREATININE 1.09 08/15/2011    Assessment: Blood pressure control:  controlled Progress toward BP goal:   at goal  Plan: Medications:  continue current medications Other plans: BMP today

## 2013-04-25 NOTE — Assessment & Plan Note (Signed)
Patient having burning sensation in right side of body, especially leg.  Will trial gabapentin 300 mg TID, will likely need to uptitrate dose at next visit.

## 2013-04-25 NOTE — Assessment & Plan Note (Signed)
Patient states he has not taken statin in years due to "side effects."  LDL 167 in 04/2011.  He has had previous stroke, also has DM2, HTN, HL.  He is in ASCVD risk factor group 1 thus would benefit from statin therapy.   Lipid panel today.  Will revisit restarting statin at next visit.

## 2013-04-25 NOTE — Assessment & Plan Note (Signed)
Not using CPAP anymore, patient states he sleeps better without it.

## 2013-04-25 NOTE — Assessment & Plan Note (Signed)
Lab Results  Component Value Date   HGBA1C 8.9 04/25/2013   HGBA1C 7.4 08/15/2011   HGBA1C 7.6 04/17/2011     Assessment: Diabetes control:  uncontrolled Progress toward A1C goal:   deteriorated Comments: Patient reports poor compliance with medications.   Plan: Medications:  continue current medications metformin 1000 mg BID Home glucose monitoring: none Frequency:   Timing:   Instruction/counseling given: reminded to bring medications to each visit Educational resources provided:  referral to diabetes educator Other plans: Consider metformin-XR at next visit if patient still having difficulty remembering to take evening dose; could also consider addition of glipizide.   Foot exam, urine microalbumin:creatinine ration, retinal scan today.

## 2013-04-26 LAB — MICROALBUMIN / CREATININE URINE RATIO
CREATININE, URINE: 69.4 mg/dL
MICROALB/CREAT RATIO: 9.7 mg/g (ref 0.0–30.0)
Microalb, Ur: 0.67 mg/dL (ref 0.00–1.89)

## 2013-05-13 NOTE — Progress Notes (Signed)
Case discussed with Dr. Dru Laurel at the time of the visit.  We reviewed the resident's history and exam and pertinent patient test results.  I agree with the assessment, diagnosis, and plan of care documented in the resident's note. 

## 2013-05-14 ENCOUNTER — Other Ambulatory Visit: Payer: Self-pay | Admitting: *Deleted

## 2013-05-14 MED ORDER — METFORMIN HCL 1000 MG PO TABS: 1000.0000 mg | ORAL_TABLET | Freq: Two times a day (BID) | ORAL | Status: DC

## 2013-05-15 ENCOUNTER — Encounter: Payer: Self-pay | Admitting: Dietician

## 2013-05-15 ENCOUNTER — Encounter: Payer: Medicaid Other | Admitting: Dietician

## 2013-05-15 ENCOUNTER — Ambulatory Visit (INDEPENDENT_AMBULATORY_CARE_PROVIDER_SITE_OTHER): Payer: Medicaid Other | Admitting: Internal Medicine

## 2013-05-15 ENCOUNTER — Encounter: Payer: Self-pay | Admitting: Internal Medicine

## 2013-05-15 VITALS — BP 118/76 | HR 63 | Temp 94.7°F | Ht 68.0 in | Wt 227.4 lb

## 2013-05-15 DIAGNOSIS — G609 Hereditary and idiopathic neuropathy, unspecified: Secondary | ICD-10-CM

## 2013-05-15 DIAGNOSIS — I10 Essential (primary) hypertension: Secondary | ICD-10-CM

## 2013-05-15 DIAGNOSIS — E119 Type 2 diabetes mellitus without complications: Secondary | ICD-10-CM

## 2013-05-15 DIAGNOSIS — K219 Gastro-esophageal reflux disease without esophagitis: Secondary | ICD-10-CM

## 2013-05-15 DIAGNOSIS — E785 Hyperlipidemia, unspecified: Secondary | ICD-10-CM

## 2013-05-15 LAB — GLUCOSE, CAPILLARY: Glucose-Capillary: 283 mg/dL — ABNORMAL HIGH (ref 70–99)

## 2013-05-15 MED ORDER — GLIPIZIDE ER 5 MG PO TB24: 5.0000 mg | ORAL_TABLET | Freq: Every day | ORAL | Status: DC

## 2013-05-15 MED ORDER — LOVASTATIN 20 MG PO TABS: 20.0000 mg | ORAL_TABLET | Freq: Every day | ORAL | Status: DC

## 2013-05-15 MED ORDER — METFORMIN HCL ER 750 MG PO TB24: 750.0000 mg | ORAL_TABLET | Freq: Every day | ORAL | Status: DC

## 2013-05-15 NOTE — Assessment & Plan Note (Signed)
BP 118/76 today, continue current regimen (enalapril-HCTZ 10-25 mg daily).

## 2013-05-15 NOTE — Assessment & Plan Note (Addendum)
Burning sensation improved with gabapentin.  However, patient has only been taking gabapentin 300 mg 1-2 times daily because he forgets to take medication doses after morning-time.  Encouraged him to take this three times daily for full effect of medicine.  Continue to monitor.

## 2013-05-15 NOTE — Progress Notes (Signed)
Patient ID: Mark Cabrera, male   DOB: Jul 05, 1950, 63 y.o.   MRN: [id number]   Subjective:   Patient ID: Mark Cabrera male   DOB: [birth year] 63 y.o.   MRN: [id number]  HPI: [name] is a 63 y.o. man with history of HTN, DM2 (A1C 8.9% in 04/2013) c/b peripheral neuropathy, HL, CVA in 2007, GERD, OA, OSA who presents for 1 month follow-up.    Patient states he is doing quite well.  He took omeprazole for one week and has not had any trouble with heartburn since.  He has also been taking gabapentin usually twice daily (never three times daily because he can't remember to take it that often) and has gotten some pain relief.    Patient reports medication noncompliance with evening doses of metformin, states that he takes all of his medicines when he gets up in morning but then often forgets to do so for rest of day.  We discussed metformin-XR so he could just take it once daily, and he much prefers this.  No symptoms of hyper/hypoglycemia.   After some discussion, patient is willing to restart statin therapy today.    Past Medical History  Diagnosis Date  . 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 2-D echo was normal no embolic source found.  . Osteoarthritis, knee   . Sleep apnea      Central apnea. Occasionally wears his CPAP at night.  . Atypical nevi   . GERD (gastroesophageal reflux disease)   . Diabetes mellitus   . Transaminitis      Statin-induced  . Hypertension   . Hyperlipidemia     hx of transaminitis secondary to statin and he has decided not to use statins secondary to potential side effects.  . Nephrolithiasis    Current Outpatient Prescriptions  Medication Sig Dispense Refill  . aspirin EC 81  MG tablet Take 1 tablet (81 mg total) by mouth daily.  150 tablet  2  . docusate sodium (COLACE) 100 MG capsule Take 1 capsule (100 mg total) by mouth daily as needed.  30 capsule  1  . enalapril-hydrochlorothiazide (VASERETIC) 10-25 MG per tablet Take 1 tablet by mouth daily.  30 tablet  11  . gabapentin (NEURONTIN) 300 MG capsule Take 1 capsule (300 mg total) by mouth 3 (three) times daily.  90 capsule  1  . MAGNESIUM PO Take 1 tablet by mouth as needed (takes usually every couple of weeks for leg cramps).      Marland Kitchen POTASSIUM PO Take 2 tablets by mouth daily as needed (for leg cramps).       Marland Kitchen glipiZIDE (GLIPIZIDE XL) 5 MG 24 hr tablet Take 1 tablet (5 mg total) by mouth daily with breakfast.  30 tablet  5  . lovastatin (MEVACOR) 20 MG tablet Take 1 tablet (20 mg total) by mouth daily.  30 tablet  5  . metFORMIN (GLUCOPHAGE XR) 750 MG 24 hr tablet Take 1 tablet (750 mg total) by mouth daily with breakfast.  30 tablet  5  . omeprazole (PRILOSEC) 20 MG capsule Take 1 capsule (20 mg total) by mouth daily.  30 capsule  1   No current facility-administered medications for this visit.   Family  History  Problem Relation Age of Onset  . Hypertension Mother   . Diabetes Mother   . Alcohol abuse Father    History   Social History  . Marital Status: Divorced    Spouse Name: N/A    Number of Children: 1  . Years of Education: 2y college   Occupational History  .  Medicaid /disability     following stroke in 2007   Social History Main Topics  . Smoking status: Never Smoker   . Smokeless tobacco: None  . Alcohol Use: Yes     Comment: occasional  . Drug Use: No  . Sexual Activity: None   Other Topics Concern  . None   Social History Narrative   Single but has a girlfriend.    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       Patient has medications disability post stroke.            Review of Systems: Review of Systems  Constitutional: Negative for  fever.  Eyes: Negative for blurred vision.  Respiratory: Negative for shortness of breath.   Cardiovascular: Negative for chest pain.  Gastrointestinal: Negative for heartburn, nausea, vomiting, abdominal pain, diarrhea and constipation.  Genitourinary: Negative for dysuria.  Musculoskeletal: Negative for falls.  Neurological: Negative for dizziness, loss of consciousness and headaches.       Chronic balance problems (since stroke).     Objective:  Physical Exam: Filed Vitals:   05/15/13 1428  BP: 118/76  Pulse: 63  Temp: 94.7 F (34.8 C)  TempSrc: Oral  Height: 5\' 8"  (1.727 m)  Weight: 227 lb 6.4 oz (103.148 kg)  SpO2: 100%   General: alert, cooperative, and in no apparent distress HEENT: NCAT, vision grossly intact, oropharynx clear and non-erythematous  Neck: supple, no lymphadenopathy Lungs: clear to ascultation bilaterally, normal work of respiration, no wheezes, rales, ronchi Heart: regular rate and rhythm, no murmurs, gallops, or rubs Abdomen: soft, non-tender, non-distended, normal bowel sounds Extremities: 2+ DP/PT pulses bilaterally, no cyanosis, clubbing, or edema Neurologic: alert & oriented X3, cranial nerves II-XII intact, strength grossly intact, sensation intact to light touch  Assessment & Plan:  Patient discussed with Dr. Ellwood Dense. Please see problem-based assessment and plan.

## 2013-05-15 NOTE — Addendum Note (Signed)
Addended by: Hulan Fray on: 05/15/2013 03:47 PM   Modules accepted: Orders

## 2013-05-15 NOTE — Assessment & Plan Note (Signed)
Patient not remembering to take evening doses of medications including metformin.  Will change patient to metformin-XR 750mg  daily today.  Will also add glipizide XR 5mg  daily as patient resistant to working on diet and exercise.  Follow-up in 3 months, repeat A1C at that time.

## 2013-05-15 NOTE — Progress Notes (Signed)
Patient ID: Mark Cabrera, male   DOB: December 22, 1950, 63 y.o.   MRN: [id number] Patient did not want to have his appointment today for DSMT or MNT. He is not interested in "being told what not to eat". He is aware that he can reschedule at any time.

## 2013-05-15 NOTE — Patient Instructions (Signed)
Follow-up with Dr. Stann Mainland in 3 months.   Please take all of your medicines as prescribed.  We sent in 3 medicines to [facility] today: extended release metformin as well as a new diabetes medicine (also extended release) called glipizide, and a cholesterol medicine called lovastatin.  You take each of these once daily.    Keep working on diet and exercise!  Diabetes Meal Planning Guide The diabetes meal planning guide is a tool to help you plan your meals and snacks. It is important for people with diabetes to manage their blood glucose (sugar) levels. Choosing the right foods and the right amounts throughout your day will help control your blood glucose. Eating right can even help you improve your blood pressure and reach or maintain a healthy weight. CARBOHYDRATE COUNTING MADE EASY When you eat carbohydrates, they turn to sugar. This raises your blood glucose level. Counting carbohydrates can help you control this level so you feel better. When you plan your meals by counting carbohydrates, you can have more flexibility in what you eat and balance your medicine with your food intake. Carbohydrate counting simply means adding up the total amount of carbohydrate grams in your meals and snacks. Try to eat about the same amount at each meal. Foods with carbohydrates are listed below. Each portion below is 1 carbohydrate serving or 15 grams of carbohydrates. Ask your dietician how many grams of carbohydrates you should eat at each meal or snack. Grains and Starches  1 slice bread.   English muffin or hotdog/hamburger bun.   cup cold cereal (unsweetened).   cup cooked pasta or rice.   cup starchy vegetables (corn, potatoes, peas, beans, winter squash).  1 tortilla (6 inches).   bagel.  1 waffle or pancake (size of a CD).   cup cooked cereal.  4 to 6 small crackers. *Whole grain is recommended. Fruit  1 cup fresh unsweetened berries, melon, papaya, pineapple.  1 small fresh  fruit.   banana or mango.   cup fruit juice (4 oz unsweetened).   cup canned fruit in natural juice or water.  2 tbs dried fruit.  12 to 15 grapes or cherries. Milk and Yogurt  1 cup fat-free or 1% milk.  1 cup soy milk.  6 oz light yogurt with sugar-free sweetener.  6 oz low-fat soy yogurt.  6 oz plain yogurt. Vegetables  1 cup raw or  cup cooked is counted as 0 carbohydrates or a "free" food.  If you eat 3 or more servings at 1 meal, count them as 1 carbohydrate serving. Other Carbohydrates   oz chips or pretzels.   cup ice cream or frozen yogurt.   cup sherbet or sorbet.  2 inch square cake, no frosting.  1 tbs honey, sugar, jam, jelly, or syrup.  2 small cookies.  3 squares of graham crackers.  3 cups popcorn.  6 crackers.  1 cup broth-based soup.  Count 1 cup casserole or other mixed foods as 2 carbohydrate servings.  Foods with less than 20 calories in a serving may be counted as 0 carbohydrates or a "free" food. You may want to purchase a book or computer software that lists the carbohydrate gram counts of different foods. In addition, the nutrition facts panel on the labels of the foods you eat are a good source of this information. The label will tell you how big the serving size is and the total number of carbohydrate grams you will be eating per serving. Divide this number by  15 to obtain the number of carbohydrate servings in a portion. Remember, 1 carbohydrate serving equals 15 grams of carbohydrate. SERVING SIZES Measuring foods and serving sizes helps you make sure you are getting the right amount of food. The list below tells how big or small some common serving sizes are.  1 oz.........4 stacked dice.  3 oz........Marland KitchenDeck of cards.  1 tsp.......Marland KitchenTip of little finger.  1 tbs......Marland KitchenMarland KitchenThumb.  2 tbs.......Marland KitchenGolf ball.   cup......Marland KitchenHalf of a fist.  1 cup.......Marland KitchenA fist. SAMPLE DIABETES MEAL PLAN Below is a sample meal plan that  includes foods from the grain and starches, dairy, vegetable, fruit, and meat groups. A dietician can individualize a meal plan to fit your calorie needs and tell you the number of servings needed from each food group. However, controlling the total amount of carbohydrates in your meal or snack is more important than making sure you include all of the food groups at every meal. You may interchange carbohydrate containing foods (dairy, starches, and fruits). The meal plan below is an example of a 2000 calorie diet using carbohydrate counting. This meal plan has 17 carbohydrate servings. Breakfast  1 cup oatmeal (2 carb servings).   cup light yogurt (1 carb serving).  1 cup blueberries (1 carb serving).   cup almonds. Snack  1 large apple (2 carb servings).  1 low-fat string cheese stick. Lunch  Chicken breast salad.  1 cup spinach.   cup chopped tomatoes.  2 oz chicken breast, sliced.  2 tbs low-fat [place] dressing.  12 whole-wheat crackers (2 carb servings).  12 to 15 grapes (1 carb serving).  1 cup low-fat milk (1 carb serving). Snack  1 cup carrots.   cup hummus (1 carb serving). Dinner  3 oz broiled salmon.  1 cup [place] rice (3 carb servings). Snack  1  cups steamed broccoli (1 carb serving) drizzled with 1 tsp olive oil and lemon juice.  1 cup light pudding (2 carb servings). DIABETES MEAL PLANNING WORKSHEET Your dietician can use this worksheet to help you decide how many servings of foods and what types of foods are right for you.  BREAKFAST Food Group and Servings / Carb Servings Grain/Starches __________________________________ Dairy __________________________________________ Vegetable ______________________________________ Fruit ___________________________________________ Meat __________________________________________ Fat ____________________________________________ LUNCH Food Group and Servings / Carb Servings Grain/Starches  ___________________________________ Dairy ___________________________________________ Fruit ____________________________________________ Meat ___________________________________________ Fat _____________________________________________ Mark Cabrera Food Group and Servings / Carb Servings Grain/Starches ___________________________________ Dairy ___________________________________________ Fruit ____________________________________________ Meat ___________________________________________ Fat _____________________________________________ SNACKS Food Group and Servings / Carb Servings Grain/Starches ___________________________________ Dairy ___________________________________________ Vegetable _______________________________________ Fruit ____________________________________________ Meat ___________________________________________ Fat _____________________________________________ DAILY TOTALS Starches _________________________ Vegetable ________________________ Fruit ____________________________ Dairy ____________________________ Meat ____________________________ Fat ______________________________ Document Released: 11/24/2004 Document Revised: 05/22/2011 Document Reviewed: 10/05/2008 [employer] Patient Information 2014 [employer]

## 2013-05-15 NOTE — Assessment & Plan Note (Signed)
LDL 111 in 04/2013; patient in ASCVD risk group 1 given history of stroke.  Sent in rx for lovastatin 20 mg daily.  Counseled patient on statin side effects, he will stop medicine and call if he has problems.

## 2013-05-15 NOTE — Assessment & Plan Note (Signed)
Resolved, patient took omeprazole for one week, not having symptoms since that time.

## 2013-05-21 NOTE — Progress Notes (Signed)
Case discussed with Dr. Rogers at the time of the visit.  We reviewed the resident's history and exam and pertinent patient test results.  I agree with the assessment, diagnosis, and plan of care documented in the resident's note. 

## 2013-06-04 NOTE — Addendum Note (Signed)
Addended by: Hulan Fray on: 06/04/2013 06:36 PM   Modules accepted: Orders

## 2013-07-29 ENCOUNTER — Encounter: Payer: Self-pay | Admitting: Internal Medicine

## 2013-07-29 ENCOUNTER — Ambulatory Visit (INDEPENDENT_AMBULATORY_CARE_PROVIDER_SITE_OTHER): Payer: Medicaid Other | Admitting: Internal Medicine

## 2013-07-29 VITALS — BP 117/75 | HR 76 | Temp 96.6°F | Ht 68.0 in | Wt 207.5 lb

## 2013-07-29 DIAGNOSIS — IMO0001 Reserved for inherently not codable concepts without codable children: Secondary | ICD-10-CM

## 2013-07-29 DIAGNOSIS — E119 Type 2 diabetes mellitus without complications: Secondary | ICD-10-CM

## 2013-07-29 DIAGNOSIS — E1165 Type 2 diabetes mellitus with hyperglycemia: Secondary | ICD-10-CM

## 2013-07-29 DIAGNOSIS — K219 Gastro-esophageal reflux disease without esophagitis: Secondary | ICD-10-CM

## 2013-07-29 LAB — POCT GLYCOSYLATED HEMOGLOBIN (HGB A1C)

## 2013-07-29 LAB — GLUCOSE, CAPILLARY: Glucose-Capillary: 593 mg/dL (ref 70–99)

## 2013-07-29 MED ORDER — GLIPIZIDE ER 5 MG PO TB24: 5.0000 mg | ORAL_TABLET | Freq: Every day | ORAL | Status: DC

## 2013-07-29 MED ORDER — OMEPRAZOLE 20 MG PO CPDR: 20.0000 mg | DELAYED_RELEASE_CAPSULE | Freq: Every day | ORAL | Status: DC

## 2013-07-29 MED ORDER — GLIPIZIDE ER 10 MG PO TB24: 10.0000 mg | ORAL_TABLET | Freq: Every day | ORAL | Status: DC

## 2013-07-29 MED ORDER — METFORMIN HCL ER (OSM) 1000 MG PO TB24
2000.0000 mg | ORAL_TABLET | Freq: Every day | ORAL | Status: DC
Start: 2013-07-29 — End: 2014-01-14

## 2013-07-29 NOTE — Progress Notes (Signed)
Subjective:    Patient ID: Mark Cabrera, male    DOB: 06-23-1950, 63 y.o.   MRN: [id number]  HPI  Mark Cabrera is a 63 y.o. man with history of HTN, DM2 (A1C 8.9% in 04/2013) c/b peripheral neuropathy, HL, CVA in 2007, GERD, OA, OSA here for an acute visit.  Patient reports multiple symptoms over the past month and a half including fatigue, polyuria, polydipsia, and leg cramps. His polyuria is so bad he has trouble reaching the bathroom. He recounts a story where he went to [place] a few weeks ago and wet himself several times.  He takes metformin for his diabetes. He was prescribed glipizide last visit but has not been taking it because he did not know what it was for. He is not interested in meeting with [name]. He is not interested in checking his blood sugars. He used to have a meter, but he "gave it back to [name] because I don't want to use it, it's too hard." He is not interested in starting insulin, "My friend [name] had to do that and he swelled up and died." He reports a generally healthy diet of fruits (bananas, mangos, grapes, apples, oranges), and vegetables/salad.  In the next few months he is planning [place] the country" to live in a popup camper with no refrigerator or running water. "I want to move into the woods because I know I'm dying and I don't want to die in the hood."   Current Outpatient Prescriptions on File Prior to Visit  Medication Sig Dispense Refill  . aspirin EC 81 MG tablet Take 1 tablet (81 mg total) by mouth daily.  150 tablet  2  . docusate sodium (COLACE) 100 MG capsule Take 1 capsule (100 mg total) by mouth daily as needed.  30 capsule  1  . enalapril-hydrochlorothiazide (VASERETIC) 10-25 MG per tablet Take 1 tablet by mouth daily.  30 tablet  11  . gabapentin (NEURONTIN) 300 MG capsule Take 1 capsule (300 mg total) by mouth 3 (three) times daily.  90 capsule  1  . glipiZIDE (GLIPIZIDE XL) 5 MG 24 hr tablet Take 1 tablet (5 mg total) by mouth  daily with breakfast.  30 tablet  5  . lovastatin (MEVACOR) 20 MG tablet Take 1 tablet (20 mg total) by mouth daily.  30 tablet  5  . MAGNESIUM PO Take 1 tablet by mouth as needed (takes usually every couple of weeks for leg cramps).      . metFORMIN (GLUCOPHAGE XR) 750 MG 24 hr tablet Take 1 tablet (750 mg total) by mouth daily with breakfast.  30 tablet  5  . omeprazole (PRILOSEC) 20 MG capsule Take 1 capsule (20 mg total) by mouth daily.  30 capsule  1  . POTASSIUM PO Take 2 tablets by mouth daily as needed (for leg cramps).         Review of Systems  Constitutional: Positive for appetite change, fatigue and unexpected weight change (20lb weight loss). Negative for fever and chills.  Eyes: Positive for visual disturbance (Blurry vision).  Respiratory: Negative for cough and shortness of breath.   Cardiovascular: Negative for chest pain.  Endocrine: Positive for polydipsia and polyuria.  Genitourinary: Positive for frequency. Negative for dysuria.  Musculoskeletal: Positive for myalgias.  Neurological: Positive for weakness and numbness ("The numbness on my right side from my stroke has been worse lately").       Objective:   Physical Exam  Constitutional:  He is oriented to person, place, and time. He appears well-developed and well-nourished. No distress.  HENT:  Head: Normocephalic and atraumatic.  Eyes: Conjunctivae and EOM are normal. Pupils are equal, round, and reactive to light.  Neck: Normal range of motion. Neck supple.  Cardiovascular: Normal rate and regular rhythm.  Exam reveals no gallop and no friction rub.   No murmur heard. Pulmonary/Chest: Effort normal and breath sounds normal. No respiratory distress. He has no wheezes. He has no rales. He exhibits no tenderness.  Abdominal: Soft. He exhibits no distension. There is no tenderness.  Musculoskeletal: Normal range of motion. He exhibits no edema and no tenderness.  Neurological: He is alert and oriented to person,  place, and time. No cranial nerve deficit.  Skin: Skin is warm and dry.  Psychiatric: He has a normal mood and affect.    Filed Vitals:   07/29/13 1015  BP: 117/75  Pulse: 76  Temp: 96.6 F (35.9 C)       Assessment & Plan:   Please see problem based charting.

## 2013-07-29 NOTE — Assessment & Plan Note (Addendum)
This patient has severely uncontrolled diabetes in the setting of medication noncompliance and general reluctance to change his habits. CBG >500. A1C >14. Fatigue, polyuria, polydipsia, and leg cramps are all part and parcel with this. Dr. Dareen Piano and I spent ~30 minutes counseling the patient on the consequences of uncontrolled diabetes, including limb loss, blindness, premature death. We reassured him that with effort and compliance we can stop many of these complications from happening, and he will feel better overall. I think he has a lot of misconceptions about diabetes after his friend died, we reassured him that it is not a death sentence but does take a lot of work to control. Unfortunately, the patient is still not willing to check his CBGs or start insulin at this time, and is even considering moving into a remote camper in the woods without refrigeration or running water (which we told him we did not think was the best idea at this time). After much discussion, he agreed to consider insulin AFTER attempting a 78-month trial of oral hypoglycemics first. - Increase metformin ER to 2000 mg daily (max dose) - Patient warned about possible GI side effects and advised to call the clinic if he develops nausea, vomiting, diarrhea - Comply with glipizide 5 mg daily - Return in 4 weeks to reevaluate and again discuss the possibility of insulin

## 2013-07-29 NOTE — Patient Instructions (Addendum)
Thank you for your visit.Marland Kitchen - I have increased the dose of your metformin ER to 2000mg  once daily. The new dosage is highlighted below. - This medicine can sometimes cause GI complaints such as nausea, vomiting, diarrhea. Please call the clinic if this happens and we will decrease the dose for you. - Please start taking your glipizide, 5 mg once daily. - Please return in one month. We'll check your blood sugar at that time. If it is still high and/or you're still having fatigue, urinary frequency, leg cramps, we will talk about insulin as we discussed.

## 2013-07-29 NOTE — Progress Notes (Signed)
INTERNAL MEDICINE TEACHING ATTENDING ADDENDUM - Mark Contes, MD: I reviewed and discussed with the resident Dr. Lucila Maine, the patient's medical history, physical examination, diagnosis and results of pertinent tests and treatment and I agree with the patient's care as documented.  Dr. Lucila Maine and I had an extensive discussion about the consequences of uncontrolled BS including limb loss, blindness, infections and even death. He expresses understanding of these consequences. Pt to follow up in 1 month and if BS are not controlled will consider starting insulin

## 2013-07-29 NOTE — Assessment & Plan Note (Signed)
Patient would like a refill of omeprazole to use as needed, I have provided a 3-month supply only.

## 2013-08-12 ENCOUNTER — Ambulatory Visit: Payer: Medicaid Other | Admitting: Internal Medicine

## 2013-08-15 ENCOUNTER — Encounter: Payer: Medicaid Other | Admitting: Internal Medicine

## 2013-08-22 ENCOUNTER — Encounter: Payer: Medicaid Other | Admitting: Internal Medicine

## 2013-11-04 ENCOUNTER — Encounter: Payer: Self-pay | Admitting: Internal Medicine

## 2013-12-09 ENCOUNTER — Encounter: Payer: Self-pay | Admitting: *Deleted

## 2013-12-15 ENCOUNTER — Ambulatory Visit (INDEPENDENT_AMBULATORY_CARE_PROVIDER_SITE_OTHER): Payer: Medicare Other | Admitting: Internal Medicine

## 2013-12-15 ENCOUNTER — Encounter: Payer: Self-pay | Admitting: Internal Medicine

## 2013-12-15 VITALS — BP 130/61 | HR 68 | Temp 97.9°F | Ht 68.0 in | Wt 231.0 lb

## 2013-12-15 DIAGNOSIS — M792 Neuralgia and neuritis, unspecified: Secondary | ICD-10-CM | POA: Diagnosis not present

## 2013-12-15 DIAGNOSIS — E1142 Type 2 diabetes mellitus with diabetic polyneuropathy: Secondary | ICD-10-CM

## 2013-12-15 DIAGNOSIS — E1149 Type 2 diabetes mellitus with other diabetic neurological complication: Secondary | ICD-10-CM | POA: Diagnosis not present

## 2013-12-15 LAB — GLUCOSE, CAPILLARY: Glucose-Capillary: 140 mg/dL — ABNORMAL HIGH (ref 70–99)

## 2013-12-15 LAB — POCT GLYCOSYLATED HEMOGLOBIN (HGB A1C): Hemoglobin A1C: 6.6

## 2013-12-15 MED ORDER — GABAPENTIN 100 MG PO CAPS: 100.0000 mg | ORAL_CAPSULE | Freq: Three times a day (TID) | ORAL | Status: DC

## 2013-12-15 NOTE — Assessment & Plan Note (Addendum)
A1c 6.6 after starting Glipizide 5mg  daily, down from >14 in May. He is not checking his CBGs but denies sx of hypoglycemia. Pt with neuropathy in hands and feet that makes ambulating difficult. Gabapentin is on his medication list but he states that he has never taken it.  - Continue Metformin 2000mg  daily due to difficult remembering to take Metformin BID - Continue Gipizide 5mg  daily - Restarting Gabapentin at 100mg  TID, may need to increase - F/u with PCP in 3 mo

## 2013-12-15 NOTE — Patient Instructions (Addendum)
**  Be sure to take all of your medications as prescribed. You will need to see your primary care provider at your next visit.   General Instructions:   Please bring your medicines with you each time you come to clinic.  Medicines may include prescription medications, over-the-counter medications, herbal remedies, eye drops, vitamins, or other pills.   Progress Toward Treatment Goals:  No flowsheet data found.  Self Care Goals & Plans:  Self Care Goal 12/15/2013  Manage my medications take my medicines as prescribed; bring my medications to every visit; refill my medications on time  Monitor my health (No Data)  Eat healthy foods -  Be physically active -    No flowsheet data found.   Care Management & Community Referrals:  No flowsheet data found.

## 2013-12-15 NOTE — Assessment & Plan Note (Signed)
Starting Gabapentin 100mg  TID

## 2013-12-15 NOTE — Progress Notes (Signed)
Patient ID: Mark Cabrera, male   DOB: 07-Nov-1950, 63 y.o.   MRN: [id number]  Subjective:   Patient ID: Mark Cabrera male   DOB: 15-Dec-1950 63 y.o.   MRN: [id number]  HPI: [name] is a 63 y.o. M w/ PMH HTN, HLD, DM2, and h/o CVA presents for DM2 f/u.   He is not checking his CBGs at home and is only intermittently taking his diabetes medications. He does not know what his CBGs are running or if he has had hypoglycemia. Last A1c >14 in May '15. He denies any diaphoresis but has chronic numbness in his feet and hands.   He states that he is taking Metformin 2000mg  daily and glipizide 5mg  daily. A1c is much improved today at 6.6.   Past Medical History  Diagnosis Date  . 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 2-D echo was normal no embolic source found.  . Osteoarthritis, knee   . Sleep apnea      Central apnea. Occasionally wears his CPAP at night.  . Atypical nevi   . GERD (gastroesophageal reflux disease)   . Diabetes mellitus   . Transaminitis      Statin-induced  . Hypertension   . Hyperlipidemia     hx of transaminitis secondary to statin and he has decided not to use statins secondary to potential side effects.  . Nephrolithiasis    Current Outpatient Prescriptions  Medication Sig Dispense Refill  . docusate sodium (COLACE) 100 MG capsule Take 1 capsule (100 mg total) by mouth daily as needed.  30 capsule  1  . enalapril-hydrochlorothiazide (VASERETIC) 10-25 MG per tablet Take 1 tablet by mouth daily.  30 tablet  11  . gabapentin (NEURONTIN) 300 MG capsule Take 1 capsule (300 mg total) by mouth 3 (three) times daily.  90 capsule  1  . glipiZIDE (GLIPIZIDE XL) 5 MG 24 hr tablet Take 1 tablet (5 mg  total) by mouth daily with breakfast.  30 tablet  5  . lovastatin (MEVACOR) 20 MG tablet Take 1 tablet (20 mg total) by mouth daily.  30 tablet  5  . MAGNESIUM PO Take 1 tablet by mouth as needed (takes usually every couple of weeks for leg cramps).      . metformin (GLUCOPHAGE XR) 1000 MG (OSM) 24 hr tablet Take 2 tablets (2,000 mg total) by mouth daily with breakfast.  60 tablet  5  . omeprazole (PRILOSEC) 20 MG capsule Take 1 capsule (20 mg total) by mouth daily.  30 capsule  0  . POTASSIUM PO Take 2 tablets by mouth daily as needed (for leg cramps).        No current facility-administered medications for this visit.   Family History  Problem Relation Age of Onset  . Hypertension Mother   . Diabetes Mother   . Alcohol abuse Father    History   Social History  . Marital Status: Divorced    Spouse Name: N/A    Number of Children: 1  . Years of Education: 2y college   Occupational History  .  Medicaid /disability     following stroke in 2007   Social History Main Topics  . Smoking status: Never Smoker   . Smokeless  tobacco: None  . Alcohol Use: Yes     Comment: occasional  . Drug Use: No  . Sexual Activity: None   Other Topics Concern  . None   Social History Narrative   Single but has a girlfriend.    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       Patient has medications disability post stroke.            Review of Systems: Constitutional: Denies fever, chills.  Respiratory: Denies SOB, DOE Cardiovascular: Denies chest pain or palpitations.  Gastrointestinal: Denies nausea, vomiting, abdominal pain, diarrhea, constipation, Musculoskeletal: Denies joint swelling.   Skin: Denies pallor, rash and wound.  Neurological: +peripheral neuropathy with numbness and tingling in hands and feet. +dizziness and +gait problem 2/2 neuropathy and dizziness, which is improved from last visit, per pt. Psychiatric/Behavioral: Denies suicidal  ideation, mood changes  Objective:  Physical Exam: Filed Vitals:   12/15/13 1035  BP: 130/61  Pulse: 68  Temp: 97.9 F (36.6 C)  TempSrc: Oral  Height: 5\' 8"  (1.727 m)  Weight: 231 lb (104.781 kg)  SpO2: 100%   Constitutional: Vital signs reviewed.  Patient is a well-developed and well-nourished male in no acute distress and cooperative with exam. Alert and oriented x3.  Head: Normocephalic and atraumatic Eyes: PERRL, EOMI Cardiovascular: RRR, no MRG Pulmonary/Chest: Normal respiratory effort, CTAB, no wheezes, rales, or rhonchi Abdominal: Soft. Non-tender, non-distended, bowel sounds are normal Musculoskeletal: No joint deformities, erythema, or stiffness, ROM full and no nontender Neurological: A&O x3, cranial nerve II-XII are grossly intact. Decreased sensation in hands and feet. Skin: Warm, dry and intact. Psychiatric: Normal mood and affect. speech and behavior is normal.   Assessment & Plan:   Please refer to Problem List based Assessment and Plan

## 2013-12-16 NOTE — Progress Notes (Signed)
Internal Medicine Clinic Attending  Case discussed with Dr. Kazibwe soon after the resident saw the patient.  We reviewed the resident's history and exam and pertinent patient test results.  I agree with the assessment, diagnosis, and plan of care documented in the resident's note. 

## 2014-01-14 ENCOUNTER — Other Ambulatory Visit: Payer: Self-pay | Admitting: *Deleted

## 2014-01-14 DIAGNOSIS — I10 Essential (primary) hypertension: Secondary | ICD-10-CM

## 2014-01-14 DIAGNOSIS — E1140 Type 2 diabetes mellitus with diabetic neuropathy, unspecified: Secondary | ICD-10-CM

## 2014-01-14 DIAGNOSIS — E1149 Type 2 diabetes mellitus with other diabetic neurological complication: Secondary | ICD-10-CM

## 2014-01-14 DIAGNOSIS — K219 Gastro-esophageal reflux disease without esophagitis: Secondary | ICD-10-CM

## 2014-01-14 MED ORDER — LOVASTATIN 20 MG PO TABS: 20.0000 mg | ORAL_TABLET | Freq: Every day | ORAL | Status: DC

## 2014-01-14 MED ORDER — ENALAPRIL-HYDROCHLOROTHIAZIDE 10-25 MG PO TABS: 1.0000 | ORAL_TABLET | Freq: Every day | ORAL | Status: DC

## 2014-01-14 MED ORDER — METFORMIN HCL ER (OSM) 1000 MG PO TB24: 2000.0000 mg | ORAL_TABLET | Freq: Every day | ORAL | Status: DC

## 2014-01-14 MED ORDER — OMEPRAZOLE 20 MG PO CPDR: 20.0000 mg | DELAYED_RELEASE_CAPSULE | Freq: Every day | ORAL | Status: DC

## 2014-01-14 MED ORDER — GLIPIZIDE ER 5 MG PO TB24: 5.0000 mg | ORAL_TABLET | Freq: Every day | ORAL | Status: DC

## 2014-01-22 NOTE — Addendum Note (Signed)
Addended by: Yvonna Alanis E on: 01/22/2014 06:30 PM   Modules accepted: Orders

## 2014-02-13 ENCOUNTER — Encounter: Payer: Self-pay | Admitting: *Deleted

## 2014-03-13 HISTORY — PX: ROTATOR CUFF REPAIR: SHX139

## 2014-03-18 ENCOUNTER — Encounter: Payer: Medicaid Other | Admitting: Internal Medicine

## 2014-03-26 DIAGNOSIS — I639 Cerebral infarction, unspecified: Secondary | ICD-10-CM | POA: Diagnosis not present

## 2014-03-26 DIAGNOSIS — G463 Brain stem stroke syndrome: Secondary | ICD-10-CM | POA: Diagnosis not present

## 2014-03-26 DIAGNOSIS — I7774 Dissection of vertebral artery: Secondary | ICD-10-CM | POA: Diagnosis not present

## 2014-03-26 DIAGNOSIS — R270 Ataxia, unspecified: Secondary | ICD-10-CM | POA: Diagnosis not present

## 2014-04-01 ENCOUNTER — Encounter: Payer: Medicaid Other | Admitting: Internal Medicine

## 2014-04-09 DIAGNOSIS — M25511 Pain in right shoulder: Secondary | ICD-10-CM | POA: Diagnosis not present

## 2014-04-09 DIAGNOSIS — M7551 Bursitis of right shoulder: Secondary | ICD-10-CM | POA: Diagnosis not present

## 2014-04-21 DIAGNOSIS — E785 Hyperlipidemia, unspecified: Secondary | ICD-10-CM | POA: Diagnosis not present

## 2014-04-21 DIAGNOSIS — Z125 Encounter for screening for malignant neoplasm of prostate: Secondary | ICD-10-CM | POA: Diagnosis not present

## 2014-04-21 DIAGNOSIS — E1169 Type 2 diabetes mellitus with other specified complication: Secondary | ICD-10-CM | POA: Diagnosis not present

## 2014-04-21 DIAGNOSIS — I10 Essential (primary) hypertension: Secondary | ICD-10-CM | POA: Diagnosis not present

## 2014-04-21 DIAGNOSIS — Z0000 Encounter for general adult medical examination without abnormal findings: Secondary | ICD-10-CM | POA: Diagnosis not present

## 2014-05-07 DIAGNOSIS — M7551 Bursitis of right shoulder: Secondary | ICD-10-CM | POA: Diagnosis not present

## 2014-05-07 DIAGNOSIS — M25511 Pain in right shoulder: Secondary | ICD-10-CM | POA: Diagnosis not present

## 2014-05-11 DIAGNOSIS — M75111 Incomplete rotator cuff tear or rupture of right shoulder, not specified as traumatic: Secondary | ICD-10-CM | POA: Diagnosis not present

## 2014-05-11 DIAGNOSIS — M19011 Primary osteoarthritis, right shoulder: Secondary | ICD-10-CM | POA: Diagnosis not present

## 2014-05-11 DIAGNOSIS — M25411 Effusion, right shoulder: Secondary | ICD-10-CM | POA: Diagnosis not present

## 2014-05-11 DIAGNOSIS — M7551 Bursitis of right shoulder: Secondary | ICD-10-CM | POA: Diagnosis not present

## 2014-05-27 DIAGNOSIS — I639 Cerebral infarction, unspecified: Secondary | ICD-10-CM | POA: Diagnosis not present

## 2014-05-27 DIAGNOSIS — M75121 Complete rotator cuff tear or rupture of right shoulder, not specified as traumatic: Secondary | ICD-10-CM | POA: Diagnosis not present

## 2014-06-01 DIAGNOSIS — Z01810 Encounter for preprocedural cardiovascular examination: Secondary | ICD-10-CM | POA: Diagnosis not present

## 2014-06-01 DIAGNOSIS — R011 Cardiac murmur, unspecified: Secondary | ICD-10-CM | POA: Diagnosis not present

## 2014-06-01 DIAGNOSIS — M67919 Unspecified disorder of synovium and tendon, unspecified shoulder: Secondary | ICD-10-CM | POA: Diagnosis not present

## 2014-06-01 DIAGNOSIS — I639 Cerebral infarction, unspecified: Secondary | ICD-10-CM | POA: Diagnosis not present

## 2014-06-04 DIAGNOSIS — I10 Essential (primary) hypertension: Secondary | ICD-10-CM | POA: Diagnosis not present

## 2014-06-04 DIAGNOSIS — R011 Cardiac murmur, unspecified: Secondary | ICD-10-CM | POA: Diagnosis not present

## 2014-06-04 DIAGNOSIS — I639 Cerebral infarction, unspecified: Secondary | ICD-10-CM | POA: Diagnosis not present

## 2014-06-04 DIAGNOSIS — G4730 Sleep apnea, unspecified: Secondary | ICD-10-CM | POA: Diagnosis not present

## 2014-06-04 DIAGNOSIS — Z01818 Encounter for other preprocedural examination: Secondary | ICD-10-CM | POA: Diagnosis not present

## 2014-06-04 DIAGNOSIS — G4733 Obstructive sleep apnea (adult) (pediatric): Secondary | ICD-10-CM | POA: Diagnosis not present

## 2014-06-15 DIAGNOSIS — G4733 Obstructive sleep apnea (adult) (pediatric): Secondary | ICD-10-CM | POA: Diagnosis not present

## 2014-06-15 DIAGNOSIS — R0683 Snoring: Secondary | ICD-10-CM | POA: Diagnosis not present

## 2014-06-15 DIAGNOSIS — I639 Cerebral infarction, unspecified: Secondary | ICD-10-CM | POA: Diagnosis not present

## 2014-06-15 DIAGNOSIS — G4710 Hypersomnia, unspecified: Secondary | ICD-10-CM | POA: Diagnosis not present

## 2014-06-15 DIAGNOSIS — R5383 Other fatigue: Secondary | ICD-10-CM | POA: Diagnosis not present

## 2014-06-19 DIAGNOSIS — G4710 Hypersomnia, unspecified: Secondary | ICD-10-CM | POA: Diagnosis not present

## 2014-06-24 DIAGNOSIS — I7774 Dissection of vertebral artery: Secondary | ICD-10-CM | POA: Diagnosis not present

## 2014-06-24 DIAGNOSIS — I6350 Cerebral infarction due to unspecified occlusion or stenosis of unspecified cerebral artery: Secondary | ICD-10-CM | POA: Diagnosis not present

## 2014-06-24 DIAGNOSIS — G463 Brain stem stroke syndrome: Secondary | ICD-10-CM | POA: Diagnosis not present

## 2014-06-25 DIAGNOSIS — R5383 Other fatigue: Secondary | ICD-10-CM | POA: Diagnosis not present

## 2014-06-25 DIAGNOSIS — I639 Cerebral infarction, unspecified: Secondary | ICD-10-CM | POA: Diagnosis not present

## 2014-06-25 DIAGNOSIS — G4710 Hypersomnia, unspecified: Secondary | ICD-10-CM | POA: Diagnosis not present

## 2014-06-25 DIAGNOSIS — G4733 Obstructive sleep apnea (adult) (pediatric): Secondary | ICD-10-CM | POA: Diagnosis not present

## 2014-06-25 DIAGNOSIS — E669 Obesity, unspecified: Secondary | ICD-10-CM | POA: Diagnosis not present

## 2014-07-13 DIAGNOSIS — J208 Acute bronchitis due to other specified organisms: Secondary | ICD-10-CM | POA: Diagnosis not present

## 2014-07-13 DIAGNOSIS — R05 Cough: Secondary | ICD-10-CM | POA: Diagnosis not present

## 2014-07-13 DIAGNOSIS — I639 Cerebral infarction, unspecified: Secondary | ICD-10-CM | POA: Diagnosis not present

## 2014-07-20 DIAGNOSIS — E785 Hyperlipidemia, unspecified: Secondary | ICD-10-CM | POA: Diagnosis not present

## 2014-07-20 DIAGNOSIS — E1169 Type 2 diabetes mellitus with other specified complication: Secondary | ICD-10-CM | POA: Diagnosis not present

## 2014-07-20 DIAGNOSIS — J209 Acute bronchitis, unspecified: Secondary | ICD-10-CM | POA: Diagnosis not present

## 2014-07-20 DIAGNOSIS — I639 Cerebral infarction, unspecified: Secondary | ICD-10-CM | POA: Diagnosis not present

## 2014-07-20 DIAGNOSIS — J208 Acute bronchitis due to other specified organisms: Secondary | ICD-10-CM | POA: Diagnosis not present

## 2014-07-28 DIAGNOSIS — Z7982 Long term (current) use of aspirin: Secondary | ICD-10-CM | POA: Diagnosis not present

## 2014-07-28 DIAGNOSIS — G629 Polyneuropathy, unspecified: Secondary | ICD-10-CM | POA: Diagnosis not present

## 2014-07-28 DIAGNOSIS — E1165 Type 2 diabetes mellitus with hyperglycemia: Secondary | ICD-10-CM | POA: Diagnosis not present

## 2014-07-28 DIAGNOSIS — R011 Cardiac murmur, unspecified: Secondary | ICD-10-CM | POA: Diagnosis not present

## 2014-07-28 DIAGNOSIS — Z79899 Other long term (current) drug therapy: Secondary | ICD-10-CM | POA: Diagnosis not present

## 2014-07-28 DIAGNOSIS — G4730 Sleep apnea, unspecified: Secondary | ICD-10-CM | POA: Diagnosis not present

## 2014-07-28 DIAGNOSIS — K760 Fatty (change of) liver, not elsewhere classified: Secondary | ICD-10-CM | POA: Diagnosis not present

## 2014-07-28 DIAGNOSIS — M75101 Unspecified rotator cuff tear or rupture of right shoulder, not specified as traumatic: Secondary | ICD-10-CM | POA: Diagnosis not present

## 2014-07-28 DIAGNOSIS — Z01812 Encounter for preprocedural laboratory examination: Secondary | ICD-10-CM | POA: Diagnosis not present

## 2014-07-28 DIAGNOSIS — Z98811 Dental restoration status: Secondary | ICD-10-CM | POA: Diagnosis not present

## 2014-07-28 DIAGNOSIS — Z880 Allergy status to penicillin: Secondary | ICD-10-CM | POA: Diagnosis not present

## 2014-07-28 DIAGNOSIS — E785 Hyperlipidemia, unspecified: Secondary | ICD-10-CM | POA: Diagnosis not present

## 2014-07-28 DIAGNOSIS — I69351 Hemiplegia and hemiparesis following cerebral infarction affecting right dominant side: Secondary | ICD-10-CM | POA: Diagnosis not present

## 2014-07-28 DIAGNOSIS — I10 Essential (primary) hypertension: Secondary | ICD-10-CM | POA: Diagnosis not present

## 2014-07-28 DIAGNOSIS — E669 Obesity, unspecified: Secondary | ICD-10-CM | POA: Diagnosis not present

## 2014-07-28 DIAGNOSIS — Z6833 Body mass index (BMI) 33.0-33.9, adult: Secondary | ICD-10-CM | POA: Diagnosis not present

## 2014-08-05 DIAGNOSIS — M13811 Other specified arthritis, right shoulder: Secondary | ICD-10-CM | POA: Diagnosis not present

## 2014-08-05 DIAGNOSIS — M7541 Impingement syndrome of right shoulder: Secondary | ICD-10-CM | POA: Diagnosis not present

## 2014-08-05 DIAGNOSIS — S46211A Strain of muscle, fascia and tendon of other parts of biceps, right arm, initial encounter: Secondary | ICD-10-CM | POA: Diagnosis not present

## 2014-08-05 DIAGNOSIS — G629 Polyneuropathy, unspecified: Secondary | ICD-10-CM | POA: Diagnosis not present

## 2014-08-05 DIAGNOSIS — Z880 Allergy status to penicillin: Secondary | ICD-10-CM | POA: Diagnosis not present

## 2014-08-05 DIAGNOSIS — I69351 Hemiplegia and hemiparesis following cerebral infarction affecting right dominant side: Secondary | ICD-10-CM | POA: Diagnosis not present

## 2014-08-05 DIAGNOSIS — Z79899 Other long term (current) drug therapy: Secondary | ICD-10-CM | POA: Diagnosis not present

## 2014-08-05 DIAGNOSIS — G8918 Other acute postprocedural pain: Secondary | ICD-10-CM | POA: Diagnosis not present

## 2014-08-05 DIAGNOSIS — M75121 Complete rotator cuff tear or rupture of right shoulder, not specified as traumatic: Secondary | ICD-10-CM | POA: Diagnosis not present

## 2014-08-05 DIAGNOSIS — S46291A Other injury of muscle, fascia and tendon of other parts of biceps, right arm, initial encounter: Secondary | ICD-10-CM | POA: Diagnosis not present

## 2014-08-05 DIAGNOSIS — E119 Type 2 diabetes mellitus without complications: Secondary | ICD-10-CM | POA: Diagnosis not present

## 2014-08-05 DIAGNOSIS — Z7982 Long term (current) use of aspirin: Secondary | ICD-10-CM | POA: Diagnosis not present

## 2014-08-05 DIAGNOSIS — E785 Hyperlipidemia, unspecified: Secondary | ICD-10-CM | POA: Diagnosis not present

## 2014-08-05 DIAGNOSIS — I10 Essential (primary) hypertension: Secondary | ICD-10-CM | POA: Diagnosis not present

## 2014-08-05 DIAGNOSIS — G4730 Sleep apnea, unspecified: Secondary | ICD-10-CM | POA: Diagnosis not present

## 2014-08-05 DIAGNOSIS — M75101 Unspecified rotator cuff tear or rupture of right shoulder, not specified as traumatic: Secondary | ICD-10-CM | POA: Diagnosis not present

## 2014-08-05 DIAGNOSIS — M19011 Primary osteoarthritis, right shoulder: Secondary | ICD-10-CM | POA: Diagnosis not present

## 2014-08-05 DIAGNOSIS — E669 Obesity, unspecified: Secondary | ICD-10-CM | POA: Diagnosis not present

## 2014-08-13 DIAGNOSIS — M7501 Adhesive capsulitis of right shoulder: Secondary | ICD-10-CM | POA: Diagnosis not present

## 2014-08-13 DIAGNOSIS — M75121 Complete rotator cuff tear or rupture of right shoulder, not specified as traumatic: Secondary | ICD-10-CM | POA: Diagnosis not present

## 2014-08-13 DIAGNOSIS — M7551 Bursitis of right shoulder: Secondary | ICD-10-CM | POA: Diagnosis not present

## 2014-08-18 DIAGNOSIS — M75121 Complete rotator cuff tear or rupture of right shoulder, not specified as traumatic: Secondary | ICD-10-CM | POA: Diagnosis not present

## 2014-08-18 DIAGNOSIS — M7501 Adhesive capsulitis of right shoulder: Secondary | ICD-10-CM | POA: Diagnosis not present

## 2014-08-18 DIAGNOSIS — M7551 Bursitis of right shoulder: Secondary | ICD-10-CM | POA: Diagnosis not present

## 2014-08-19 DIAGNOSIS — M75121 Complete rotator cuff tear or rupture of right shoulder, not specified as traumatic: Secondary | ICD-10-CM | POA: Diagnosis not present

## 2014-08-19 DIAGNOSIS — M7501 Adhesive capsulitis of right shoulder: Secondary | ICD-10-CM | POA: Diagnosis not present

## 2014-08-19 DIAGNOSIS — M7551 Bursitis of right shoulder: Secondary | ICD-10-CM | POA: Diagnosis not present

## 2014-08-20 DIAGNOSIS — Z9889 Other specified postprocedural states: Secondary | ICD-10-CM | POA: Diagnosis not present

## 2014-08-21 DIAGNOSIS — M7551 Bursitis of right shoulder: Secondary | ICD-10-CM | POA: Diagnosis not present

## 2014-08-21 DIAGNOSIS — M7501 Adhesive capsulitis of right shoulder: Secondary | ICD-10-CM | POA: Diagnosis not present

## 2014-08-21 DIAGNOSIS — M75121 Complete rotator cuff tear or rupture of right shoulder, not specified as traumatic: Secondary | ICD-10-CM | POA: Diagnosis not present

## 2014-08-25 DIAGNOSIS — M7501 Adhesive capsulitis of right shoulder: Secondary | ICD-10-CM | POA: Diagnosis not present

## 2014-08-25 DIAGNOSIS — M7551 Bursitis of right shoulder: Secondary | ICD-10-CM | POA: Diagnosis not present

## 2014-08-25 DIAGNOSIS — M75121 Complete rotator cuff tear or rupture of right shoulder, not specified as traumatic: Secondary | ICD-10-CM | POA: Diagnosis not present

## 2014-08-28 DIAGNOSIS — M75121 Complete rotator cuff tear or rupture of right shoulder, not specified as traumatic: Secondary | ICD-10-CM | POA: Diagnosis not present

## 2014-08-28 DIAGNOSIS — M7501 Adhesive capsulitis of right shoulder: Secondary | ICD-10-CM | POA: Diagnosis not present

## 2014-08-28 DIAGNOSIS — M7551 Bursitis of right shoulder: Secondary | ICD-10-CM | POA: Diagnosis not present

## 2014-09-01 DIAGNOSIS — M7501 Adhesive capsulitis of right shoulder: Secondary | ICD-10-CM | POA: Diagnosis not present

## 2014-09-01 DIAGNOSIS — M75121 Complete rotator cuff tear or rupture of right shoulder, not specified as traumatic: Secondary | ICD-10-CM | POA: Diagnosis not present

## 2014-09-01 DIAGNOSIS — M7551 Bursitis of right shoulder: Secondary | ICD-10-CM | POA: Diagnosis not present

## 2014-09-02 DIAGNOSIS — M7551 Bursitis of right shoulder: Secondary | ICD-10-CM | POA: Diagnosis not present

## 2014-09-02 DIAGNOSIS — M75121 Complete rotator cuff tear or rupture of right shoulder, not specified as traumatic: Secondary | ICD-10-CM | POA: Diagnosis not present

## 2014-09-02 DIAGNOSIS — M7501 Adhesive capsulitis of right shoulder: Secondary | ICD-10-CM | POA: Diagnosis not present

## 2015-05-24 DIAGNOSIS — I10 Essential (primary) hypertension: Secondary | ICD-10-CM | POA: Diagnosis not present

## 2015-05-24 DIAGNOSIS — E1169 Type 2 diabetes mellitus with other specified complication: Secondary | ICD-10-CM | POA: Diagnosis not present

## 2015-05-24 DIAGNOSIS — E1142 Type 2 diabetes mellitus with diabetic polyneuropathy: Secondary | ICD-10-CM | POA: Diagnosis not present

## 2015-05-24 DIAGNOSIS — R011 Cardiac murmur, unspecified: Secondary | ICD-10-CM | POA: Diagnosis not present

## 2015-05-24 DIAGNOSIS — E785 Hyperlipidemia, unspecified: Secondary | ICD-10-CM | POA: Diagnosis not present

## 2015-05-31 DIAGNOSIS — I350 Nonrheumatic aortic (valve) stenosis: Secondary | ICD-10-CM | POA: Diagnosis not present

## 2015-05-31 DIAGNOSIS — I252 Old myocardial infarction: Secondary | ICD-10-CM | POA: Diagnosis not present

## 2015-05-31 DIAGNOSIS — R9431 Abnormal electrocardiogram [ECG] [EKG]: Secondary | ICD-10-CM | POA: Diagnosis not present

## 2015-06-01 DIAGNOSIS — I350 Nonrheumatic aortic (valve) stenosis: Secondary | ICD-10-CM | POA: Diagnosis not present

## 2015-06-01 DIAGNOSIS — R011 Cardiac murmur, unspecified: Secondary | ICD-10-CM | POA: Diagnosis not present

## 2015-06-09 DIAGNOSIS — S134XXA Sprain of ligaments of cervical spine, initial encounter: Secondary | ICD-10-CM | POA: Diagnosis not present

## 2015-06-09 DIAGNOSIS — M542 Cervicalgia: Secondary | ICD-10-CM | POA: Diagnosis not present

## 2015-06-09 DIAGNOSIS — M62838 Other muscle spasm: Secondary | ICD-10-CM | POA: Diagnosis not present

## 2015-09-02 DIAGNOSIS — E1169 Type 2 diabetes mellitus with other specified complication: Secondary | ICD-10-CM | POA: Diagnosis not present

## 2015-09-02 DIAGNOSIS — Z1211 Encounter for screening for malignant neoplasm of colon: Secondary | ICD-10-CM | POA: Diagnosis not present

## 2015-09-02 DIAGNOSIS — E785 Hyperlipidemia, unspecified: Secondary | ICD-10-CM | POA: Diagnosis not present

## 2015-09-02 DIAGNOSIS — I10 Essential (primary) hypertension: Secondary | ICD-10-CM | POA: Diagnosis not present

## 2015-09-02 DIAGNOSIS — Z0000 Encounter for general adult medical examination without abnormal findings: Secondary | ICD-10-CM | POA: Diagnosis not present

## 2015-10-12 DIAGNOSIS — T781XXA Other adverse food reactions, not elsewhere classified, initial encounter: Secondary | ICD-10-CM | POA: Diagnosis not present

## 2016-01-25 ENCOUNTER — Ambulatory Visit (INDEPENDENT_AMBULATORY_CARE_PROVIDER_SITE_OTHER): Payer: Medicare Other | Admitting: Internal Medicine

## 2016-01-25 ENCOUNTER — Encounter: Payer: Self-pay | Admitting: Internal Medicine

## 2016-01-25 VITALS — BP 136/79 | HR 87 | Temp 98.1°F | Ht 68.0 in | Wt 232.0 lb

## 2016-01-25 DIAGNOSIS — R0601 Orthopnea: Secondary | ICD-10-CM | POA: Diagnosis not present

## 2016-01-25 DIAGNOSIS — Z8679 Personal history of other diseases of the circulatory system: Secondary | ICD-10-CM | POA: Diagnosis not present

## 2016-01-25 DIAGNOSIS — R1013 Epigastric pain: Secondary | ICD-10-CM

## 2016-01-25 DIAGNOSIS — R011 Cardiac murmur, unspecified: Secondary | ICD-10-CM

## 2016-01-25 NOTE — Patient Instructions (Signed)
[name],  It was a pleasure meeting you today. An echo has been ordered and someone will call you to make an appointment to have the imaging completed. Please return to the clinic if your symptoms worsen. If the clinic is not open please go to the ED for worsening symptoms.

## 2016-01-25 NOTE — Progress Notes (Signed)
   CC: worsening shortness of breath  HPI:  [name] is a 65 y.o. gentleman with history noted below that presents to the internal medicine clinic for worsening shortness of breath that started 3-4 months ago. He states that he gets woken up at night due to being short of breath. It started out as once a week and now every few nights he is short of breath. Sitting up in bed helps improve symptoms. He also becomes dyspneic on exertion. He states he cannot walk long distances without feeling short of breath and this is new since one year ago. Associated symptoms include burning in the epigastric region and throat tightness. He denies any chest pain, vision changes, syncopal episodes, or coughing.  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  Past Medical History:  Diagnosis Date  . Atypical nevi   . Diabetes mellitus   . GERD (gastroesophageal reflux disease)   . Hyperlipidemia    hx of transaminitis secondary to statin and he has decided not to use statins secondary to potential side effects.  . Hypertension   . Nephrolithiasis   . Osteoarthritis, knee   . Sleep apnea     Central apnea. Occasionally wears his CPAP at night.  . Transaminitis     Statin-induced  . 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 2-D echo was normal no embolic source found.    Review of Systems:  Review of Systems  Eyes: Negative for blurred vision.  Respiratory: Positive for shortness of breath.   Cardiovascular: Negative for chest pain.  Gastrointestinal: Positive for heartburn.     Physical Exam:  Vitals:   01/25/16 1508  BP: 136/79  Pulse: 87  Temp: 98.1 F (36.7 C)  TempSrc:  Oral  SpO2: 100%  Weight: 232 lb (105.2 kg)  Height: 5\' 8"  (1.727 m)   Physical Exam  Constitutional: He is well-developed, well-nourished, and in no distress.  Cardiovascular:  3/4 crescendo decrescendo aortic stenosis murmur with some mild regurg. S2 was difficult to assess.  Pulmonary/Chest: Effort normal and breath sounds normal. No respiratory distress. He has no wheezes. He has no rales.  Musculoskeletal:  1+ pitting edema bilaterally in lower extremities    Assessment & Plan:   See encounters tab for problem based medical decision making.   Patient seen with Dr. Eppie Gibson

## 2016-01-25 NOTE — Assessment & Plan Note (Addendum)
Assessment: Orthopnea Patient is being woken up at night due to shortness of breath several times a week. Echo results in 07/2012 showed a left ventricular ejection fraction of 55-60%.  The aortic valve was thickened, calcified with restricted motion and consistent with mild aortic stenosis. On exam a 3/4 crescendo-decrescendo murmur with mild regurgitation was heard.  S2 was difficult to assess .I'm concerned that this is congestive heart failure due to worsening of his aortic stenosis.  He states he has a history of bicuspid aortic valve and thus is at an increased risk of rapidly worsening calcifications of the valve.    We will get an echo to assess severity of aortic stenosis and left ventricular ejection fraction.  If echo results are unchanged from previous echo I would recommend an assessment of lung function to see if this is the cause of his shortness of breath. I would consider PFTs and a chest x-ray.  Plan -Echo

## 2016-01-27 NOTE — Progress Notes (Signed)
I saw and evaluated the patient.  I personally confirmed the key portions of Dr. Jodene Nam history and exam and reviewed pertinent patient test results.  The assessment, diagnosis, and plan were formulated together and I agree with the documentation in the resident's note with the following exceptions:  My physical examination was notable for a III/VI crescendo-decrescendo harsh systolic murmur best heard at the right upper sternal border and an absent (not difficult to assess) S2.  There was also a very subtle diastolic murmur in the same area.  Given this examination and his presenting complaints I am concerned that his aortic stenosis has progressed from mild 3 years ago to moderately-severe at this time.  We will better assess with an Echocardiogram to assess the aortic valve for significant stenosis and gradient as well as look for LVH, changes in LV chamber size, and decreased systolic function.  Further evaluation/therapy is pending the results of this study.

## 2016-02-07 ENCOUNTER — Encounter (HOSPITAL_COMMUNITY): Payer: Self-pay

## 2016-02-07 ENCOUNTER — Emergency Department (HOSPITAL_COMMUNITY)
Admission: EM | Admit: 2016-02-07 | Discharge: 2016-02-07 | Disposition: A | Discharge status: Home / Self Care | Payer: Medicare Other | Attending: Emergency Medicine | Admitting: Emergency Medicine

## 2016-02-07 ENCOUNTER — Emergency Department (HOSPITAL_COMMUNITY): Payer: Medicare Other

## 2016-02-07 DIAGNOSIS — Z7984 Long term (current) use of oral hypoglycemic drugs: Secondary | ICD-10-CM | POA: Diagnosis not present

## 2016-02-07 DIAGNOSIS — R0602 Shortness of breath: Secondary | ICD-10-CM | POA: Diagnosis not present

## 2016-02-07 DIAGNOSIS — Z5321 Procedure and treatment not carried out due to patient leaving prior to being seen by health care provider: Secondary | ICD-10-CM | POA: Insufficient documentation

## 2016-02-07 DIAGNOSIS — E119 Type 2 diabetes mellitus without complications: Secondary | ICD-10-CM | POA: Diagnosis not present

## 2016-02-07 DIAGNOSIS — I10 Essential (primary) hypertension: Secondary | ICD-10-CM | POA: Diagnosis not present

## 2016-02-07 LAB — CBC
HEMATOCRIT: 34.9 % — AB (ref 39.0–52.0)
HEMOGLOBIN: 11.8 g/dL — AB (ref 13.0–17.0)
MCH: 29.2 pg (ref 26.0–34.0)
MCHC: 33.8 g/dL (ref 30.0–36.0)
MCV: 86.4 fL (ref 78.0–100.0)
Platelets: 197 10*3/uL (ref 150–400)
RBC: 4.04 MIL/uL — ABNORMAL LOW (ref 4.22–5.81)
RDW: 12.8 % (ref 11.5–15.5)
WBC: 4.3 10*3/uL (ref 4.0–10.5)

## 2016-02-07 LAB — BASIC METABOLIC PANEL
ANION GAP: 8 (ref 5–15)
BUN: 13 mg/dL (ref 6–20)
CO2: 23 mmol/L (ref 22–32)
Calcium: 9.1 mg/dL (ref 8.9–10.3)
Chloride: 105 mmol/L (ref 101–111)
Creatinine, Ser: 0.91 mg/dL (ref 0.61–1.24)
GFR calc Af Amer: >60 mL/min (ref >60)
GLUCOSE: 248 mg/dL — AB (ref 65–99)
POTASSIUM: 3.8 mmol/L (ref 3.5–5.1)
Sodium: 136 mmol/L (ref 135–145)

## 2016-02-07 LAB — I-STAT TROPONIN, ED: Troponin i, poc: 0.05 ng/mL (ref 0.00–0.08)

## 2016-02-07 NOTE — ED Triage Notes (Signed)
Pt reports ongoing shortness of breath X2 months. He also reports some chest pain, nausea, and tingling in his right arm. Cardiac hx and hx of stroke.

## 2016-02-16 ENCOUNTER — Ambulatory Visit (HOSPITAL_COMMUNITY)
Admission: RE | Admit: 2016-02-16 | Discharge: 2016-02-16 | Disposition: A | Discharge status: Home / Self Care | Payer: Medicare Other | Source: Ambulatory Visit | Attending: Internal Medicine | Admitting: Internal Medicine

## 2016-02-16 DIAGNOSIS — I352 Nonrheumatic aortic (valve) stenosis with insufficiency: Secondary | ICD-10-CM | POA: Insufficient documentation

## 2016-02-16 DIAGNOSIS — R0601 Orthopnea: Secondary | ICD-10-CM | POA: Diagnosis not present

## 2016-02-16 DIAGNOSIS — I5189 Other ill-defined heart diseases: Secondary | ICD-10-CM | POA: Insufficient documentation

## 2016-02-16 DIAGNOSIS — I517 Cardiomegaly: Secondary | ICD-10-CM | POA: Diagnosis not present

## 2016-02-16 DIAGNOSIS — R0600 Dyspnea, unspecified: Secondary | ICD-10-CM | POA: Diagnosis present

## 2016-02-16 DIAGNOSIS — I340 Nonrheumatic mitral (valve) insufficiency: Secondary | ICD-10-CM | POA: Insufficient documentation

## 2016-02-16 NOTE — Progress Notes (Signed)
  Echocardiogram 2D Echocardiogram has been performed.  Mark Cabrera 02/16/2016, 11:12 AM

## 2016-02-25 ENCOUNTER — Telehealth: Payer: Self-pay

## 2016-02-25 DIAGNOSIS — I5032 Chronic diastolic (congestive) heart failure: Secondary | ICD-10-CM

## 2016-02-25 NOTE — Telephone Encounter (Signed)
Sending pt's request to Dr Magdalene River.

## 2016-02-25 NOTE — Telephone Encounter (Signed)
Requesting test result for heart. Please call pt back.

## 2016-03-02 NOTE — Telephone Encounter (Signed)
Mark Cabrera does not have a cell phone or home phone.  He has left his brother's contact number in order to communicate.  I let Mark Cabrera brother know that the patient's Echo showed severe aortic stenosis and I am concerned that Mark Cabrera may need a valve replacement.  I placed a referral to Cardiology and also advised brother to be aware for a phone call to set up an appointment.

## 2016-03-10 ENCOUNTER — Encounter (INDEPENDENT_AMBULATORY_CARE_PROVIDER_SITE_OTHER): Payer: Self-pay

## 2016-03-10 ENCOUNTER — Encounter: Payer: Self-pay | Admitting: Cardiology

## 2016-03-10 ENCOUNTER — Ambulatory Visit (INDEPENDENT_AMBULATORY_CARE_PROVIDER_SITE_OTHER): Payer: Medicare Other | Admitting: Cardiology

## 2016-03-10 ENCOUNTER — Encounter: Payer: Self-pay | Admitting: *Deleted

## 2016-03-10 VITALS — BP 140/70 | HR 71 | Ht 68.0 in | Wt 230.8 lb

## 2016-03-10 DIAGNOSIS — I779 Disorder of arteries and arterioles, unspecified: Secondary | ICD-10-CM

## 2016-03-10 DIAGNOSIS — I10 Essential (primary) hypertension: Secondary | ICD-10-CM

## 2016-03-10 DIAGNOSIS — R0602 Shortness of breath: Secondary | ICD-10-CM | POA: Diagnosis not present

## 2016-03-10 DIAGNOSIS — I5032 Chronic diastolic (congestive) heart failure: Secondary | ICD-10-CM

## 2016-03-10 DIAGNOSIS — I350 Nonrheumatic aortic (valve) stenosis: Secondary | ICD-10-CM | POA: Diagnosis not present

## 2016-03-10 DIAGNOSIS — G4733 Obstructive sleep apnea (adult) (pediatric): Secondary | ICD-10-CM

## 2016-03-10 DIAGNOSIS — I739 Peripheral vascular disease, unspecified: Secondary | ICD-10-CM

## 2016-03-10 DIAGNOSIS — E1149 Type 2 diabetes mellitus with other diabetic neurological complication: Secondary | ICD-10-CM

## 2016-03-10 LAB — CBC WITH DIFFERENTIAL/PLATELET
BASOS ABS: 0 {cells}/uL (ref 0–200)
Basophils Relative: 0 %
EOS ABS: 90 {cells}/uL (ref 15–500)
Eosinophils Relative: 2 %
HEMATOCRIT: 35.7 % — AB (ref 38.5–50.0)
Hemoglobin: 11.6 g/dL — ABNORMAL LOW (ref 13.2–17.1)
LYMPHS PCT: 35 %
Lymphs Abs: 1575 cells/uL (ref 850–3900)
MCH: 28.5 pg (ref 27.0–33.0)
MCHC: 32.5 g/dL (ref 32.0–36.0)
MCV: 87.7 fL (ref 80.0–100.0)
MONO ABS: 360 {cells}/uL (ref 200–950)
MONOS PCT: 8 %
MPV: 10.6 fL (ref 7.5–12.5)
NEUTROS ABS: 2475 {cells}/uL (ref 1500–7800)
Neutrophils Relative %: 55 %
PLATELETS: 204 10*3/uL (ref 140–400)
RBC: 4.07 MIL/uL — ABNORMAL LOW (ref 4.20–5.80)
RDW: 13.2 % (ref 11.0–15.0)
WBC: 4.5 10*3/uL (ref 3.8–10.8)

## 2016-03-10 LAB — BASIC METABOLIC PANEL
BUN: 13 mg/dL (ref 7–25)
CO2: 22 mmol/L (ref 20–31)
Calcium: 9.1 mg/dL (ref 8.6–10.3)
Chloride: 104 mmol/L (ref 98–110)
Creat: 0.89 mg/dL (ref 0.70–1.25)
GLUCOSE: 249 mg/dL — AB (ref 65–99)
POTASSIUM: 4 mmol/L (ref 3.5–5.3)
Sodium: 138 mmol/L (ref 135–146)

## 2016-03-10 NOTE — Progress Notes (Signed)
Cardiology Office Note NEW PATIENT VISIT  Date:  03/10/2016   ID:  Kail, Visconti 07/03/1950, MRN [id number]  PCP:  [name] Kerbs, DO  now Dr. Burney Gauze at [facility] internal medicine.  Cardiologist:  New Dr. [name]    Chief Complaint  Patient presents with  . Aortic Stenosis    SOB      History of Present Illness: DARROL BABIN is a 65 y.o. male who presents for severe AS.  His aortic valve has been followed for several years- was mild stenosis in 2014.  Now severe. He has complaints of SOB at times that awakens him from sleep.  He feels his throat closing and SOB.  Sits on side of bed and gradually improves.  Also now with increasing DOE.  Even walking slow he has to stop and catch his breath.  Anytime his HR increases he becomes SOB and has to stop.  Some chest pressure with this as well. No syncope.  These symptoms began about  3 months ago.    Echo 12.6/17 Study Conclusions  - Left ventricle: The cavity size was normal. Wall thickness was   increased in a pattern of moderate LVH. Systolic function was   normal. The estimated ejection fraction was in the range of 55%   to 60%. Wall motion was normal; there were no regional wall   motion abnormalities. Features are consistent with a pseudonormal   left ventricular filling pattern, with concomitant abnormal   relaxation and increased filling pressure (grade 2 diastolic   dysfunction). - Aortic valve: Trileaflet; severely calcified leaflets. There was   severe stenosis. There was trivial regurgitation. Mean gradient   (S): 47 mm Hg. Peak gradient (S): 78 mm Hg. Valve area (VTI):   0.87 cm^2. - Mitral valve: There was mild regurgitation. - Left atrium: The atrium was moderately dilated. - Right ventricle: The cavity size was normal. Systolic function   was normal. - Right atrium: The atrium was mildly dilated. - Pulmonary arteries: No complete TR doppler jet so unable to   estimate PA systolic pressure. - Inferior  vena cava: The vessel was normal in size. The   respirophasic diameter changes were in the normal range (>= 50%),   consistent with normal central venous pressure.  Impressions:  - Normal LV size with moderate LV hypertrophy. EF 55-60%. Moderate   diastolic dysfunction. Normal RV size and systolic function.   Severe aortic stenosis. Mild mitral regurgitation.   Other hx of HTN, OSA but has not used CPAP for years.  currently lives without running water, dirt floor, no electricity though he does use solar panels.   Has wood burning stove.  He is diabetic- type 2.  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  Residual Rt sided tingling and numbness and does not feel pain on rt side.  He is dizzy with this as well.      Past Medical History:  Diagnosis Date  . Atypical nevi   . Diabetes mellitus   . GERD (gastroesophageal reflux disease)   . Hyperlipidemia    hx of transaminitis secondary to statin and he has decided not to use statins secondary to potential side effects.  . Hypertension   . Nephrolithiasis   . Osteoarthritis, knee   . Sleep apnea     Central apnea. Occasionally wears his CPAP at night.  . Transaminitis     Statin-induced  .  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 2-D echo was normal no embolic source found.    Past Surgical History:  Procedure Laterality Date  . KNEE ARTHROSCOPY W/ PARTIAL MEDIAL MENISCECTOMY  05/12/2005   right, performed by Dr. French Ana for torn medial meniscus.     Current Outpatient Prescriptions  Medication Sig Dispense Refill  . aspirin 81 MG tablet Take 81 mg by mouth daily.    .  enalapril-hydrochlorothiazide (VASERETIC) 10-25 MG per tablet Take 1 tablet by mouth daily. 30 tablet 11  . GLIPIZIDE XL 10 MG 24 hr tablet Take 20 mg by mouth daily.    Marland Kitchen MAGNESIUM PO Take 1 tablet by mouth as needed (takes usually every couple of weeks for leg cramps).    . metFORMIN (GLUCOPHAGE-XR) 500 MG 24 hr tablet Take 1,000 mg by mouth 2 (two) times daily with a meal.    . POTASSIUM PO Take 2 tablets by mouth daily as needed (for leg cramps).     . gabapentin (NEURONTIN) 100 MG capsule Take 1 capsule (100 mg total) by mouth 3 (three) times daily. 90 capsule 1   No current facility-administered medications for this visit.     Allergies:   Crab (diagnostic) and Penicillins    Social History:  The patient  reports that he has never smoked. He has never used smokeless tobacco. He reports that he drinks alcohol. He reports that he does not use drugs.   Family History:  The patient's family history includes Alcohol abuse in his father; Diabetes in his mother; Hypertension in his mother.    ROS:  General:no colds or fevers, no weight changes Skin:no rashes or ulcers HEENT:no blurred vision, no congestion CV:see HPI PUL:see HPI GI:no diarrhea constipation or melena, no indigestion GU:no hematuria, no dysuria MS:no joint pain, no claudication Neuro:no syncope, no lightheadedness, chornic dizziness since CVA Endo:+ diabetes with improved control, no thyroid disease  Wt Readings from Last 3 Encounters:  03/10/16 230 lb 12.8 oz (104.7 kg)  02/07/16 230 lb (104.3 kg)  01/25/16 232 lb (105.2 kg)     PHYSICAL EXAM: VS:  BP 140/70 (BP Location: Right Arm)   Pulse 71   Ht 5\' 8"  (1.727 m)   Wt 230 lb 12.8 oz (104.7 kg)   BMI 35.09 kg/m  , BMI Body mass index is 35.09 kg/m. General:Pleasant affect, NAD Skin:Warm and dry, brisk capillary refill HEENT:normocephalic, sclera clear, mucus membranes moist Neck:supple, no JVD, + carotid bruits may be murmur radiation Heart:S1S2 RRR with  3-4 systolic murmur harsh rt of sternum but more squeaky laterally, no gallup, rub or click Lungs:clear without rales, rhonchi, or wheezes [id number], non tender, + BS, do not palpate liver spleen or masses Ext:no lower ext edema, 2+ pedal pulses, 2+ radial pulses Neuro:alert and oriented X 3, MAE, follows commands, + facial symmetry    EKG:  EKG is ordered today. The ekg ordered today demonstrates  SR prolonged QTc 465 ms.     Recent Labs: 02/07/2016: BUN 13; Creatinine, Ser 0.91; Hemoglobin 11.8; Platelets 197; Potassium 3.8; Sodium 136    Lipid Panel    Component Value Date/Time   CHOL 206 (H) 04/25/2013 1535   TRIG 241 (H) 04/25/2013 1535   HDL 47 04/25/2013  1535   CHOLHDL 4.4 04/25/2013 1535   VLDL 48 (H) 04/25/2013 1535   LDLCALC 111 (H) 04/25/2013 1535       Other studies Reviewed: Additional studies/ records that were reviewed today include: echo above.   ASSESSMENT AND PLAN:  1.   Severe AS symptomatic with DOE though CAD could be playing a role.  Dr. Curt Bears  Discussed with pt as well.  Plan will be for Lt and Rt cardiac cath next week with Dr. [name]  - Dr. [name] will assume care not Dr. Curt Bears.   Further plans after cath with Dr. [name].  The patient understands that risks included but are not limited to stroke (1 in 1000), death (1 in 8), kidney failure [usually temporary] (1 in 500), bleeding (1 in 200), allergic reaction [possibly serious] (1 in 200).   2.  HTN  continue medications controlled  3.  Hx CVA with vertebral artery dissection  4. Untreated sleep apnea.    5. DM-3 stable per pt  Followed by PCP  Pt if surgery needed post procedure can stay with family in warm home until he recovers.    Over weekend if symptoms increase he is to go to ER.     Current medicines are reviewed with the patient today.  The patient Has no concerns regarding medicines.  The following changes have been made:  See above Labs/ tests ordered today  include:see above  Disposition:   FU:  see above  Signed, Cecilie Kicks, NP  03/10/2016 4:10 PM    [facility] [place], [address] [place], [place] Phone: [phone]; Fax: [phone]  I have seen and examined this patient with Cecilie Kicks.  Agree with above, note added to reflect my findings. Presented to the office with SOB and known aortic stenosis. Plan for left and right heart cath and referral for possible TAVR.    Will M. Camnitz MD 03/10/2016 4:54 PM

## 2016-03-10 NOTE — Patient Instructions (Addendum)
Medication Instructions:  Your physician recommends that you continue on your current medications as directed. Please refer to the Current Medication list given to you today.   Labwork: TODAY:  CBC W/ DIFF, PT/INR, & BMET  Testing/Procedures: Your physician has requested that you have a cardiac catheterization. Cardiac catheterization is used to diagnose and/or treat various heart conditions. Doctors may recommend this procedure for a number of different reasons. The most common reason is to evaluate chest pain. Chest pain can be a symptom of coronary artery disease (CAD), and cardiac catheterization can show whether plaque is narrowing or blocking your heart's arteries. This procedure is also used to evaluate the valves, as well as measure the blood flow and oxygen levels in different parts of your heart. For further information please visit [email or web address][email or web address]. Please follow instruction sheet, as given.   Follow-Up: Your physician recommends that you schedule a follow-up appointment in: WILL BE SET UP AT [facility]    Any Other Special Instructions Will Be Listed Below (If Applicable).   Coronary Angiogram With Stent Coronary angiogram with stent placement is a procedure to widen or open a narrow blood vessel of the heart (coronary artery). Arteries may become blocked by cholesterol buildup (plaques) in the lining or wall. When a coronary artery becomes partially blocked, blood flow to that area decreases. This may lead to chest pain or a heart attack (myocardial infarction). A stent is a small piece of metal that looks like mesh or a spring. Stent placement may be done as treatment for a heart attack or right after a coronary angiogram in which a blocked artery is found. Let your health care provider know about:  Any allergies you have.  All medicines you are taking, including vitamins, herbs, eye drops, creams, and over-the-counter medicines.  Any problems you or family members have had  with anesthetic medicines.  Any blood disorders you have.  Any surgeries you have had.  Any medical conditions you have.  Whether you are pregnant or may be pregnant. What are the risks? Generally, this is a safe procedure. However, problems may occur, including:  Damage to the heart or its blood vessels.  A return of blockage.  Bleeding, infection, or bruising at the insertion site.  A collection of blood under the skin (hematoma) at the insertion site.  A blood clot in another part of the body.  Kidney injury.  Allergic reaction to the dye or contrast that is used.  Bleeding into the abdomen (retroperitoneal bleeding). What happens before the procedure? Staying hydrated  Follow instructions from your health care provider about hydration, which may include:  Up to 2 hours before the procedure - you may continue to drink clear liquids, such as water, clear fruit juice, black coffee, and plain tea. Eating and drinking restrictions  Follow instructions from your health care provider about eating and drinking, which may include:  8 hours before the procedure - stop eating heavy meals or foods such as meat, fried foods, or fatty foods.  6 hours before the procedure - stop eating light meals or foods, such as toast or cereal.  2 hours before the procedure - stop drinking clear liquids. Ask your health care provider about:  Changing or stopping your regular medicines. This is especially important if you are taking diabetes medicines or blood thinners.  Taking medicines such as ibuprofen. These medicines can thin your blood. Do not take these medicines before your procedure if your health care provider instructs you not to. Generally, aspirin  is recommended before a procedure of passing a small, thin tube (catheter) through a blood vessel and into the heart (cardiac catheterization). What happens during the procedure?  An IV tube will be inserted into one of your veins.  You  will be given one or more of the following:  A medicine to help you relax (sedative).  A medicine to numb the area where the catheter will be inserted into an artery (local anesthetic).  To reduce your risk of infection:  Your health care team will wash or sanitize their hands.  Your skin will be washed with soap.  Hair may be removed from the area where the catheter will be inserted.  Using a guide wire, the catheter will be inserted into an artery. The location may be in your groin, in your wrist, or in the fold of your arm (near your elbow).  A type of X-ray (fluoroscopy) will be used to help guide the catheter to the opening of the arteries in the heart.  A dye will be injected into the catheter, and X-rays will be taken. The dye will help to show where any narrowing or blockages are located in the arteries.  A tiny wire will be guided to the blocked spot, and a balloon will be inflated to make the artery wider.  The stent will be expanded and will crush the plaques into the wall of the vessel. The stent will hold the area open and improve the blood flow. Most stents have a drug coating to reduce the risk of the stent narrowing over time.  The artery may be made wider using a drill, laser, or other tools to remove plaques.  When the blood flow is better, the catheter will be removed. The lining of the artery will grow over the stent, which stays where it was placed. This procedure may vary among health care providers and hospitals. What happens after the procedure?  If the procedure is done through the leg, you will be kept in bed lying flat for about 6 hours. You will be instructed to not bend and not cross your legs.  The insertion site will be checked frequently.  The pulse in your foot or wrist will be checked frequently.  You may have additional blood tests, X-rays, and a test that records the electrical activity of your heart (electrocardiogram, or ECG). This  information is not intended to replace advice given to you by your health care provider. Make sure you discuss any questions you have with your health care provider. Document Released: 09/03/2002 Document Revised: 10/28/2015 Document Reviewed: 10/03/2015 Elsevier Interactive Patient Education  2017 [employer].   If you need a refill on your cardiac medications before your next appointment, please call your pharmacy.

## 2016-03-11 LAB — PROTIME-INR
INR: 1
Prothrombin Time: 10.4 s (ref 9.0–11.5)

## 2016-03-13 DIAGNOSIS — I350 Nonrheumatic aortic (valve) stenosis: Secondary | ICD-10-CM

## 2016-03-13 HISTORY — DX: Nonrheumatic aortic (valve) stenosis: I35.0

## 2016-03-14 ENCOUNTER — Telehealth: Payer: Self-pay | Admitting: Cardiovascular Disease

## 2016-03-14 NOTE — Telephone Encounter (Signed)
New message   Pt husband said that [facility] called for him personally

## 2016-03-14 NOTE — Telephone Encounter (Signed)
Will forward to Dr McAlhany  

## 2016-03-15 ENCOUNTER — Encounter (HOSPITAL_COMMUNITY): Payer: Self-pay | Admitting: Cardiovascular Disease

## 2016-03-15 ENCOUNTER — Other Ambulatory Visit: Payer: Self-pay | Admitting: *Deleted

## 2016-03-15 ENCOUNTER — Inpatient Hospital Stay (HOSPITAL_COMMUNITY)
Admission: RE | Admit: 2016-03-15 | Discharge: 2016-03-18 | DRG: 287 | Disposition: A | Discharge status: Home / Self Care | Payer: Medicare Other | Source: Ambulatory Visit | Attending: Cardiovascular Disease | Admitting: Cardiovascular Disease

## 2016-03-15 ENCOUNTER — Encounter (HOSPITAL_COMMUNITY)
Admission: RE | Disposition: A | Discharge status: Home / Self Care | Payer: Self-pay | Source: Ambulatory Visit | Attending: Cardiovascular Disease

## 2016-03-15 DIAGNOSIS — E785 Hyperlipidemia, unspecified: Secondary | ICD-10-CM | POA: Diagnosis present

## 2016-03-15 DIAGNOSIS — R011 Cardiac murmur, unspecified: Secondary | ICD-10-CM | POA: Diagnosis present

## 2016-03-15 DIAGNOSIS — I350 Nonrheumatic aortic (valve) stenosis: Secondary | ICD-10-CM

## 2016-03-15 DIAGNOSIS — I700 Atherosclerosis of aorta: Secondary | ICD-10-CM | POA: Diagnosis present

## 2016-03-15 DIAGNOSIS — I10 Essential (primary) hypertension: Secondary | ICD-10-CM | POA: Diagnosis not present

## 2016-03-15 DIAGNOSIS — Z9181 History of falling: Secondary | ICD-10-CM | POA: Diagnosis not present

## 2016-03-15 DIAGNOSIS — I5032 Chronic diastolic (congestive) heart failure: Secondary | ICD-10-CM | POA: Diagnosis present

## 2016-03-15 DIAGNOSIS — K219 Gastro-esophageal reflux disease without esophagitis: Secondary | ICD-10-CM | POA: Diagnosis present

## 2016-03-15 DIAGNOSIS — E1149 Type 2 diabetes mellitus with other diabetic neurological complication: Secondary | ICD-10-CM | POA: Diagnosis present

## 2016-03-15 DIAGNOSIS — I110 Hypertensive heart disease with heart failure: Secondary | ICD-10-CM | POA: Diagnosis present

## 2016-03-15 DIAGNOSIS — Z880 Allergy status to penicillin: Secondary | ICD-10-CM | POA: Diagnosis not present

## 2016-03-15 DIAGNOSIS — I4581 Long QT syndrome: Secondary | ICD-10-CM | POA: Diagnosis present

## 2016-03-15 DIAGNOSIS — Z7984 Long term (current) use of oral hypoglycemic drugs: Secondary | ICD-10-CM | POA: Diagnosis not present

## 2016-03-15 DIAGNOSIS — I509 Heart failure, unspecified: Secondary | ICD-10-CM

## 2016-03-15 DIAGNOSIS — I6523 Occlusion and stenosis of bilateral carotid arteries: Secondary | ICD-10-CM | POA: Diagnosis present

## 2016-03-15 DIAGNOSIS — I5031 Acute diastolic (congestive) heart failure: Secondary | ICD-10-CM | POA: Diagnosis not present

## 2016-03-15 DIAGNOSIS — I6521 Occlusion and stenosis of right carotid artery: Secondary | ICD-10-CM | POA: Diagnosis present

## 2016-03-15 DIAGNOSIS — Z79899 Other long term (current) drug therapy: Secondary | ICD-10-CM

## 2016-03-15 DIAGNOSIS — G4733 Obstructive sleep apnea (adult) (pediatric): Secondary | ICD-10-CM | POA: Diagnosis present

## 2016-03-15 DIAGNOSIS — Z87442 Personal history of urinary calculi: Secondary | ICD-10-CM

## 2016-03-15 DIAGNOSIS — Z01810 Encounter for preprocedural cardiovascular examination: Secondary | ICD-10-CM | POA: Diagnosis not present

## 2016-03-15 DIAGNOSIS — Z7982 Long term (current) use of aspirin: Secondary | ICD-10-CM | POA: Diagnosis not present

## 2016-03-15 DIAGNOSIS — E1122 Type 2 diabetes mellitus with diabetic chronic kidney disease: Secondary | ICD-10-CM | POA: Diagnosis present

## 2016-03-15 DIAGNOSIS — R200 Anesthesia of skin: Secondary | ICD-10-CM | POA: Diagnosis present

## 2016-03-15 DIAGNOSIS — I2510 Atherosclerotic heart disease of native coronary artery without angina pectoris: Secondary | ICD-10-CM

## 2016-03-15 DIAGNOSIS — I69398 Other sequelae of cerebral infarction: Secondary | ICD-10-CM

## 2016-03-15 DIAGNOSIS — Z952 Presence of prosthetic heart valve: Secondary | ICD-10-CM

## 2016-03-15 DIAGNOSIS — G4730 Sleep apnea, unspecified: Secondary | ICD-10-CM | POA: Diagnosis present

## 2016-03-15 DIAGNOSIS — Z8249 Family history of ischemic heart disease and other diseases of the circulatory system: Secondary | ICD-10-CM

## 2016-03-15 DIAGNOSIS — R208 Other disturbances of skin sensation: Secondary | ICD-10-CM | POA: Diagnosis present

## 2016-03-15 DIAGNOSIS — I052 Rheumatic mitral stenosis with insufficiency: Secondary | ICD-10-CM | POA: Diagnosis present

## 2016-03-15 DIAGNOSIS — I5033 Acute on chronic diastolic (congestive) heart failure: Secondary | ICD-10-CM | POA: Diagnosis present

## 2016-03-15 DIAGNOSIS — Z91013 Allergy to seafood: Secondary | ICD-10-CM

## 2016-03-15 DIAGNOSIS — Z833 Family history of diabetes mellitus: Secondary | ICD-10-CM

## 2016-03-15 DIAGNOSIS — Z811 Family history of alcohol abuse and dependence: Secondary | ICD-10-CM

## 2016-03-15 DIAGNOSIS — I119 Hypertensive heart disease without heart failure: Secondary | ICD-10-CM | POA: Diagnosis present

## 2016-03-15 DIAGNOSIS — I272 Pulmonary hypertension, unspecified: Secondary | ICD-10-CM | POA: Diagnosis present

## 2016-03-15 DIAGNOSIS — M792 Neuralgia and neuritis, unspecified: Secondary | ICD-10-CM

## 2016-03-15 DIAGNOSIS — R42 Dizziness and giddiness: Secondary | ICD-10-CM | POA: Diagnosis present

## 2016-03-15 DIAGNOSIS — R0602 Shortness of breath: Secondary | ICD-10-CM | POA: Diagnosis not present

## 2016-03-15 HISTORY — DX: Dyspnea, unspecified: R06.00

## 2016-03-15 HISTORY — DX: Acute diastolic (congestive) heart failure: I50.31

## 2016-03-15 HISTORY — PX: CARDIAC CATHETERIZATION: SHX172

## 2016-03-15 LAB — GLUCOSE, CAPILLARY
Glucose-Capillary: 159 mg/dL — ABNORMAL HIGH (ref 65–99)
Glucose-Capillary: 190 mg/dL — ABNORMAL HIGH (ref 65–99)
Glucose-Capillary: 323 mg/dL — ABNORMAL HIGH (ref 65–99)

## 2016-03-15 SURGERY — RIGHT/LEFT HEART CATH AND CORONARY ANGIOGRAPHY
Anesthesia: LOCAL

## 2016-03-15 MED ORDER — VERAPAMIL HCL 2.5 MG/ML IV SOLN
INTRAVENOUS | Status: DC | PRN
  Administered 2016-03-15: 10 mL via INTRA_ARTERIAL

## 2016-03-15 MED ORDER — SODIUM CHLORIDE 0.9% FLUSH: 3.0000 mL | INTRAVENOUS | Status: DC | PRN

## 2016-03-15 MED ORDER — ASPIRIN 81 MG PO CHEW: 81.0000 mg | CHEWABLE_TABLET | ORAL | Status: DC

## 2016-03-15 MED ORDER — DIPHENHYDRAMINE HCL 50 MG/ML IJ SOLN
25.0000 mg | Freq: Once | INTRAMUSCULAR | Status: AC
Start: 2016-03-15 — End: 2016-03-15
  Administered 2016-03-15: 25 mg via INTRAVENOUS

## 2016-03-15 MED ORDER — FUROSEMIDE 10 MG/ML IJ SOLN
INTRAMUSCULAR | Status: AC
  Filled 2016-03-15: qty 4

## 2016-03-15 MED ORDER — MIDAZOLAM HCL 2 MG/2ML IJ SOLN
INTRAMUSCULAR | Status: DC | PRN
  Administered 2016-03-15: 2 mg via INTRAVENOUS

## 2016-03-15 MED ORDER — HEPARIN (PORCINE) IN NACL 2-0.9 UNIT/ML-% IJ SOLN
INTRAMUSCULAR | Status: DC | PRN
  Administered 2016-03-15: 1000 mL

## 2016-03-15 MED ORDER — FUROSEMIDE 10 MG/ML IJ SOLN
40.0000 mg | Freq: Two times a day (BID) | INTRAMUSCULAR | Status: AC
  Administered 2016-03-15 – 2016-03-17 (×5): 40 mg via INTRAVENOUS
  Filled 2016-03-15 (×4): qty 4

## 2016-03-15 MED ORDER — ENALAPRIL MALEATE 10 MG PO TABS: 10.0000 mg | ORAL_TABLET | Freq: Every day | ORAL | Status: DC

## 2016-03-15 MED ORDER — FAMOTIDINE IN NACL 20-0.9 MG/50ML-% IV SOLN
INTRAVENOUS | Status: AC
  Administered 2016-03-15: 20 mg via INTRAVENOUS
  Filled 2016-03-15: qty 50

## 2016-03-15 MED ORDER — SODIUM CHLORIDE 0.9% FLUSH
3.0000 mL | Freq: Two times a day (BID) | INTRAVENOUS | Status: DC
  Administered 2016-03-15 – 2016-03-17 (×5): 3 mL via INTRAVENOUS

## 2016-03-15 MED ORDER — SODIUM CHLORIDE 0.9 % WEIGHT BASED INFUSION
3.0000 mL/kg/h | INTRAVENOUS | Status: DC
  Administered 2016-03-15: 3 mL/kg/h via INTRAVENOUS

## 2016-03-15 MED ORDER — HEPARIN SODIUM (PORCINE) 1000 UNIT/ML IJ SOLN
INTRAMUSCULAR | Status: AC
  Filled 2016-03-15: qty 1

## 2016-03-15 MED ORDER — GLIPIZIDE ER 10 MG PO TB24
20.0000 mg | ORAL_TABLET | Freq: Every day | ORAL | Status: DC
  Administered 2016-03-15 – 2016-03-16 (×2): 20 mg via ORAL
  Filled 2016-03-15 (×3): qty 2

## 2016-03-15 MED ORDER — ACETAMINOPHEN 325 MG PO TABS: 650.0000 mg | ORAL_TABLET | ORAL | Status: DC | PRN

## 2016-03-15 MED ORDER — SODIUM CHLORIDE 0.9 % IV SOLN: INTRAVENOUS | Status: AC

## 2016-03-15 MED ORDER — DIPHENHYDRAMINE HCL 50 MG/ML IJ SOLN
INTRAMUSCULAR | Status: AC
  Administered 2016-03-15: 25 mg via INTRAVENOUS
  Filled 2016-03-15: qty 1

## 2016-03-15 MED ORDER — SODIUM CHLORIDE 0.9% FLUSH: 3.0000 mL | Freq: Two times a day (BID) | INTRAVENOUS | Status: DC

## 2016-03-15 MED ORDER — HEPARIN (PORCINE) IN NACL 2-0.9 UNIT/ML-% IJ SOLN
INTRAMUSCULAR | Status: AC
Start: 2016-03-15 — End: 2016-03-15
  Filled 2016-03-15: qty 1000

## 2016-03-15 MED ORDER — GABAPENTIN 100 MG PO CAPS
100.0000 mg | ORAL_CAPSULE | Freq: Every day | ORAL | Status: DC
  Administered 2016-03-15 – 2016-03-18 (×4): 100 mg via ORAL
  Filled 2016-03-15 (×5): qty 1

## 2016-03-15 MED ORDER — LIDOCAINE HCL (PF) 1 % IJ SOLN
INTRAMUSCULAR | Status: AC
  Filled 2016-03-15: qty 30

## 2016-03-15 MED ORDER — METHYLPREDNISOLONE SODIUM SUCC 125 MG IJ SOLR
125.0000 mg | Freq: Once | INTRAMUSCULAR | Status: AC
  Administered 2016-03-15: 125 mg via INTRAVENOUS

## 2016-03-15 MED ORDER — MIDAZOLAM HCL 2 MG/2ML IJ SOLN
INTRAMUSCULAR | Status: AC
  Filled 2016-03-15: qty 2

## 2016-03-15 MED ORDER — IOPAMIDOL (ISOVUE-370) INJECTION 76%
INTRAVENOUS | Status: DC | PRN
  Administered 2016-03-15: 115 mL via INTRAVENOUS

## 2016-03-15 MED ORDER — FAMOTIDINE IN NACL 20-0.9 MG/50ML-% IV SOLN
20.0000 mg | Freq: Once | INTRAVENOUS | Status: AC
  Administered 2016-03-15: 20 mg via INTRAVENOUS

## 2016-03-15 MED ORDER — FENTANYL CITRATE (PF) 100 MCG/2ML IJ SOLN
INTRAMUSCULAR | Status: AC
  Filled 2016-03-15: qty 2

## 2016-03-15 MED ORDER — SODIUM CHLORIDE 0.9 % IV SOLN: 250.0000 mL | INTRAVENOUS | Status: DC | PRN

## 2016-03-15 MED ORDER — ASPIRIN EC 81 MG PO TBEC
81.0000 mg | DELAYED_RELEASE_TABLET | Freq: Every day | ORAL | Status: DC
  Administered 2016-03-16 – 2016-03-18 (×3): 81 mg via ORAL
  Filled 2016-03-15 (×4): qty 1

## 2016-03-15 MED ORDER — LOSARTAN POTASSIUM 50 MG PO TABS
50.0000 mg | ORAL_TABLET | Freq: Every day | ORAL | Status: DC
  Administered 2016-03-15 – 2016-03-18 (×4): 50 mg via ORAL
  Filled 2016-03-15 (×4): qty 1

## 2016-03-15 MED ORDER — ONDANSETRON HCL 4 MG/2ML IJ SOLN: 4.0000 mg | Freq: Four times a day (QID) | INTRAMUSCULAR | Status: DC | PRN

## 2016-03-15 MED ORDER — FENTANYL CITRATE (PF) 100 MCG/2ML IJ SOLN
INTRAMUSCULAR | Status: DC | PRN
  Administered 2016-03-15: 50 ug via INTRAVENOUS

## 2016-03-15 MED ORDER — VERAPAMIL HCL 2.5 MG/ML IV SOLN
INTRAVENOUS | Status: AC
  Filled 2016-03-15: qty 2

## 2016-03-15 MED ORDER — SODIUM CHLORIDE 0.9 % WEIGHT BASED INFUSION: 1.0000 mL/kg/h | INTRAVENOUS | Status: DC

## 2016-03-15 MED ORDER — METHYLPREDNISOLONE SODIUM SUCC 125 MG IJ SOLR
INTRAMUSCULAR | Status: AC
  Administered 2016-03-15: 125 mg via INTRAVENOUS
  Filled 2016-03-15: qty 2

## 2016-03-15 MED ORDER — LIDOCAINE HCL (PF) 1 % IJ SOLN
INTRAMUSCULAR | Status: DC | PRN
  Administered 2016-03-15: 30 mL via INTRADERMAL

## 2016-03-15 MED ORDER — IOPAMIDOL (ISOVUE-370) INJECTION 76%
INTRAVENOUS | Status: AC
  Filled 2016-03-15: qty 50

## 2016-03-15 MED ORDER — IOPAMIDOL (ISOVUE-370) INJECTION 76%
INTRAVENOUS | Status: AC
  Filled 2016-03-15: qty 100

## 2016-03-15 MED ORDER — HEPARIN SODIUM (PORCINE) 1000 UNIT/ML IJ SOLN
INTRAMUSCULAR | Status: DC | PRN
  Administered 2016-03-15: 5000 [IU] via INTRAVENOUS

## 2016-03-15 SURGICAL SUPPLY — 12 items
CATH BALLN WEDGE 5F 110CM (CATHETERS) ×1 IMPLANT
CATH INFINITI 5FR MULTPACK ANG (CATHETERS) ×1 IMPLANT
CATH VISTA GUIDE 6FR XBLAD3.5 (CATHETERS) ×1 IMPLANT
DEVICE RAD COMP TR BAND LRG (VASCULAR PRODUCTS) ×1 IMPLANT
GLIDESHEATH SLEND SS 6F .021 (SHEATH) ×1 IMPLANT
GUIDEWIRE INQWIRE 1.5J.035X260 (WIRE) IMPLANT
INQWIRE 1.5J .035X260CM (WIRE) ×2
KIT HEART LEFT (KITS) ×2 IMPLANT
PACK CARDIAC CATHETERIZATION (CUSTOM PROCEDURE TRAY) ×2 IMPLANT
SHEATH FAST CATH BRACH 5F 5CM (SHEATH) ×1 IMPLANT
TRANSDUCER W/STOPCOCK (MISCELLANEOUS) ×3 IMPLANT
TUBING CIL FLEX 10 FLL-RA (TUBING) ×2 IMPLANT

## 2016-03-15 NOTE — Interval H&P Note (Signed)
History and Physical Interval Note:  03/15/2016 7:37 AM  Mark Cabrera  has presented today for cardiac cath with the diagnosis of severe AS. The various methods of treatment have been discussed with the patient and family. After consideration of risks, benefits and other options for treatment, the patient has consented to  Procedure(s): Right/Left Heart Cath and Coronary Angiography (N/A) as a surgical intervention .  The patient's history has been reviewed, patient examined, no change in status, stable for surgery.  I have reviewed the patient's chart and labs.  Questions were answered to the patient's satisfaction.    Cath Lab Visit (complete for each Cath Lab visit)  Clinical Evaluation Leading to the Procedure:   ACS: No.  Non-ACS:    Anginal Classification: CCS III  Anti-ischemic medical therapy: No Therapy  Non-Invasive Test Results: No non-invasive testing performed  Prior CABG: No previous CABG         Lauree Chandler

## 2016-03-15 NOTE — Progress Notes (Signed)
TR BAND REMOVAL  LOCATION:    Left radial  DEFLATED PER PROTOCOL:    Yes.    TIME BAND OFF / DRESSING APPLIED:    1230p   SITE UPON ARRIVAL:    Level 0  SITE AFTER BAND REMOVAL:    Level  0  CIRCULATION SENSATION AND MOVEMENT:    Within Normal Limits   Yes.    COMMENTS:   Radial pulses present,  Mild tenderness to touch at site. .  Pt able to move fingers with not problem.

## 2016-03-15 NOTE — Progress Notes (Signed)
Site area: Left brachial a 5 french venous sheath was removed  Site Prior to Removal:  Level 0  Pressure Applied For 15 MINUTES    Bedrest Beginning at  1045am  Manual:   Yes.    Patient Status During Pull:  stable  Post Pull Groin Site:  Level 0  Post Pull Instructions Given:  Yes.    Post Pull Pulses Present:  Yes.    Dressing Applied:  Yes.    Comments:  VS remain stable .  Lasix 40mg  IV given per MD order.

## 2016-03-15 NOTE — Progress Notes (Signed)
Pt here today for outpatient cardiac cath with severe AS. He is found to have elevated filling pressures with single vessel CAD. Findings of severe AS. Will admit for diuresis with IV lasix. Will ask CT surgery to see him while he is here to start planning for CABG/AVR over the next few weeks.   Lauree Chandler 03/15/2016 9:46 AM

## 2016-03-15 NOTE — H&P (View-Only) (Signed)
Cardiology Office Note NEW PATIENT VISIT  Date:  03/10/2016   ID:  Aaraf, Plaza August 17, 1950, MRN [id number]  PCP:  [name] Kerbs, DO  now Dr. Burney Gauze at [facility] internal medicine.  Cardiologist:  New Dr. [name]    Chief Complaint  Patient presents with  . Aortic Stenosis    SOB      History of Present Illness: RUSH BRUST is a 66 y.o. male who presents for severe AS.  His aortic valve has been followed for several years- was mild stenosis in 2014.  Now severe. He has complaints of SOB at times that awakens him from sleep.  He feels his throat closing and SOB.  Sits on side of bed and gradually improves.  Also now with increasing DOE.  Even walking slow he has to stop and catch his breath.  Anytime his HR increases he becomes SOB and has to stop.  Some chest pressure with this as well. No syncope.  These symptoms began about  3 months ago.    Echo 12.6/17 Study Conclusions  - Left ventricle: The cavity size was normal. Wall thickness was   increased in a pattern of moderate LVH. Systolic function was   normal. The estimated ejection fraction was in the range of 55%   to 60%. Wall motion was normal; there were no regional wall   motion abnormalities. Features are consistent with a pseudonormal   left ventricular filling pattern, with concomitant abnormal   relaxation and increased filling pressure (grade 2 diastolic   dysfunction). - Aortic valve: Trileaflet; severely calcified leaflets. There was   severe stenosis. There was trivial regurgitation. Mean gradient   (S): 47 mm Hg. Peak gradient (S): 78 mm Hg. Valve area (VTI):   0.87 cm^2. - Mitral valve: There was mild regurgitation. - Left atrium: The atrium was moderately dilated. - Right ventricle: The cavity size was normal. Systolic function   was normal. - Right atrium: The atrium was mildly dilated. - Pulmonary arteries: No complete TR doppler jet so unable to   estimate PA systolic pressure. - Inferior  vena cava: The vessel was normal in size. The   respirophasic diameter changes were in the normal range (>= 50%),   consistent with normal central venous pressure.  Impressions:  - Normal LV size with moderate LV hypertrophy. EF 55-60%. Moderate   diastolic dysfunction. Normal RV size and systolic function.   Severe aortic stenosis. Mild mitral regurgitation.   Other hx of HTN, OSA but has not used CPAP for years.  currently lives without running water, dirt floor, no electricity though he does use solar panels.   Has wood burning stove.  He is diabetic- type 2.  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  Residual Rt sided tingling and numbness and does not feel pain on rt side.  He is dizzy with this as well.      Past Medical History:  Diagnosis Date  . Atypical nevi   . Diabetes mellitus   . GERD (gastroesophageal reflux disease)   . Hyperlipidemia    hx of transaminitis secondary to statin and he has decided not to use statins secondary to potential side effects.  . Hypertension   . Nephrolithiasis   . Osteoarthritis, knee   . Sleep apnea     Central apnea. Occasionally wears his CPAP at night.  . Transaminitis     Statin-induced  .  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 2-D echo was normal no embolic source found.    Past Surgical History:  Procedure Laterality Date  . KNEE ARTHROSCOPY W/ PARTIAL MEDIAL MENISCECTOMY  05/12/2005   right, performed by Dr. French Ana for torn medial meniscus.     Current Outpatient Prescriptions  Medication Sig Dispense Refill  . aspirin 81 MG tablet Take 81 mg by mouth daily.    .  enalapril-hydrochlorothiazide (VASERETIC) 10-25 MG per tablet Take 1 tablet by mouth daily. 30 tablet 11  . GLIPIZIDE XL 10 MG 24 hr tablet Take 20 mg by mouth daily.    Marland Kitchen MAGNESIUM PO Take 1 tablet by mouth as needed (takes usually every couple of weeks for leg cramps).    . metFORMIN (GLUCOPHAGE-XR) 500 MG 24 hr tablet Take 1,000 mg by mouth 2 (two) times daily with a meal.    . POTASSIUM PO Take 2 tablets by mouth daily as needed (for leg cramps).     . gabapentin (NEURONTIN) 100 MG capsule Take 1 capsule (100 mg total) by mouth 3 (three) times daily. 90 capsule 1   No current facility-administered medications for this visit.     Allergies:   Crab (diagnostic) and Penicillins    Social History:  The patient  reports that he has never smoked. He has never used smokeless tobacco. He reports that he drinks alcohol. He reports that he does not use drugs.   Family History:  The patient's family history includes Alcohol abuse in his father; Diabetes in his mother; Hypertension in his mother.    ROS:  General:no colds or fevers, no weight changes Skin:no rashes or ulcers HEENT:no blurred vision, no congestion CV:see HPI PUL:see HPI GI:no diarrhea constipation or melena, no indigestion GU:no hematuria, no dysuria MS:no joint pain, no claudication Neuro:no syncope, no lightheadedness, chornic dizziness since CVA Endo:+ diabetes with improved control, no thyroid disease  Wt Readings from Last 3 Encounters:  03/10/16 230 lb 12.8 oz (104.7 kg)  02/07/16 230 lb (104.3 kg)  01/25/16 232 lb (105.2 kg)     PHYSICAL EXAM: VS:  BP 140/70 (BP Location: Right Arm)   Pulse 71   Ht 5\' 8"  (1.727 m)   Wt 230 lb 12.8 oz (104.7 kg)   BMI 35.09 kg/m  , BMI Body mass index is 35.09 kg/m. General:Pleasant affect, NAD Skin:Warm and dry, brisk capillary refill HEENT:normocephalic, sclera clear, mucus membranes moist Neck:supple, no JVD, + carotid bruits may be murmur radiation Heart:S1S2 RRR with  3-4 systolic murmur harsh rt of sternum but more squeaky laterally, no gallup, rub or click Lungs:clear without rales, rhonchi, or wheezes [id number], non tender, + BS, do not palpate liver spleen or masses Ext:no lower ext edema, 2+ pedal pulses, 2+ radial pulses Neuro:alert and oriented X 3, MAE, follows commands, + facial symmetry    EKG:  EKG is ordered today. The ekg ordered today demonstrates  SR prolonged QTc 465 ms.     Recent Labs: 02/07/2016: BUN 13; Creatinine, Ser 0.91; Hemoglobin 11.8; Platelets 197; Potassium 3.8; Sodium 136    Lipid Panel    Component Value Date/Time   CHOL 206 (H) 04/25/2013 1535   TRIG 241 (H) 04/25/2013 1535   HDL 47 04/25/2013  1535   CHOLHDL 4.4 04/25/2013 1535   VLDL 48 (H) 04/25/2013 1535   LDLCALC 111 (H) 04/25/2013 1535       Other studies Reviewed: Additional studies/ records that were reviewed today include: echo above.   ASSESSMENT AND PLAN:  1.   Severe AS symptomatic with DOE though CAD could be playing a role.  Dr. Curt Bears  Discussed with pt as well.  Plan Hadlyn Amero be for Lt and Rt cardiac cath next week with Dr. [name]  - Dr. [name] Avir Deruiter assume care not Dr. Curt Bears.   Further plans after cath with Dr. [name].  The patient understands that risks included but are not limited to stroke (1 in 1000), death (1 in 22), kidney failure [usually temporary] (1 in 500), bleeding (1 in 200), allergic reaction [possibly serious] (1 in 200).   2.  HTN  continue medications controlled  3.  Hx CVA with vertebral artery dissection  4. Untreated sleep apnea.    5. DM-3 stable per pt  Followed by PCP  Pt if surgery needed post procedure can stay with family in warm home until he recovers.    Over weekend if symptoms increase he is to go to ER.     Current medicines are reviewed with the patient today.  The patient Has no concerns regarding medicines.  The following changes have been made:  See above Labs/ tests ordered today  include:see above  Disposition:   FU:  see above  Signed, Cecilie Kicks, NP  03/10/2016 4:10 PM    [facility] [place], [address] [place], [place] Phone: [phone]; Fax: [phone]  I have seen and examined this patient with Cecilie Kicks.  Agree with above, note added to reflect my findings. Presented to the office with SOB and known aortic stenosis. Plan for left and right heart cath and referral for possible TAVR.    Jerilynn Feldmeier M. Isahia Hollerbach MD 03/10/2016 4:54 PM

## 2016-03-15 NOTE — Progress Notes (Signed)
Pt A&O x4; pt admitted to the unit from cath lab; Left brachial and radial level 0 with clean, dry and intact dsg. No stain, bleeding or hematoma noted. VSS; telemetry applied and verified with CCMD and NT called to second verify. Pt oriented to the unit; vascular check wnl to LUE; IV intact and transfusing. No wounds or pressure ulcer noted; fall/safety precaution and prevention education completed with pt. Call light within reach and family at bedside. Will closely monitor pt. Delia Heady RN

## 2016-03-16 ENCOUNTER — Inpatient Hospital Stay (HOSPITAL_COMMUNITY): Payer: Medicare Other

## 2016-03-16 DIAGNOSIS — I2510 Atherosclerotic heart disease of native coronary artery without angina pectoris: Secondary | ICD-10-CM | POA: Diagnosis present

## 2016-03-16 DIAGNOSIS — Z01810 Encounter for preprocedural cardiovascular examination: Secondary | ICD-10-CM

## 2016-03-16 DIAGNOSIS — I350 Nonrheumatic aortic (valve) stenosis: Secondary | ICD-10-CM

## 2016-03-16 DIAGNOSIS — I10 Essential (primary) hypertension: Secondary | ICD-10-CM

## 2016-03-16 LAB — POCT I-STAT 3, VENOUS BLOOD GAS (G3P V)
ACID-BASE DEFICIT: 4 mmol/L — AB (ref 0.0–2.0)
Bicarbonate: 21.1 mmol/L (ref 20.0–28.0)
O2 SAT: 56 %
PO2 VEN: 31 mmHg — AB (ref 32.0–45.0)
TCO2: 22 mmol/L (ref 0–100)
pCO2, Ven: 37.9 mmHg — ABNORMAL LOW (ref 44.0–60.0)
pH, Ven: 7.353 (ref 7.250–7.430)

## 2016-03-16 LAB — POCT I-STAT 3, ART BLOOD GAS (G3+)
ACID-BASE DEFICIT: 7 mmol/L — AB (ref 0.0–2.0)
Bicarbonate: 18.1 mmol/L — ABNORMAL LOW (ref 20.0–28.0)
O2 SAT: 95 %
TCO2: 19 mmol/L (ref 0–100)
pCO2 arterial: 33.7 mmHg (ref 32.0–48.0)
pH, Arterial: 7.338 — ABNORMAL LOW (ref 7.350–7.450)
pO2, Arterial: 78 mmHg — ABNORMAL LOW (ref 83.0–108.0)

## 2016-03-16 LAB — CBC
HCT: 33.5 % — ABNORMAL LOW (ref 39.0–52.0)
Hemoglobin: 11.2 g/dL — ABNORMAL LOW (ref 13.0–17.0)
MCH: 28.4 pg (ref 26.0–34.0)
MCHC: 33.4 g/dL (ref 30.0–36.0)
MCV: 85 fL (ref 78.0–100.0)
PLATELETS: 236 10*3/uL (ref 150–400)
RBC: 3.94 MIL/uL — ABNORMAL LOW (ref 4.22–5.81)
RDW: 13.1 % (ref 11.5–15.5)
WBC: 8.4 10*3/uL (ref 4.0–10.5)

## 2016-03-16 LAB — BASIC METABOLIC PANEL
ANION GAP: 9 (ref 5–15)
BUN: 16 mg/dL (ref 6–20)
CALCIUM: 9.3 mg/dL (ref 8.9–10.3)
CHLORIDE: 103 mmol/L (ref 101–111)
CO2: 26 mmol/L (ref 22–32)
Creatinine, Ser: 1.01 mg/dL (ref 0.61–1.24)
GFR calc Af Amer: >60 mL/min (ref >60)
GFR calc non Af Amer: >60 mL/min (ref >60)
GLUCOSE: 229 mg/dL — AB (ref 65–99)
Potassium: 3.9 mmol/L (ref 3.5–5.1)
Sodium: 138 mmol/L (ref 135–145)

## 2016-03-16 LAB — GLUCOSE, CAPILLARY: Glucose-Capillary: 194 mg/dL — ABNORMAL HIGH (ref 65–99)

## 2016-03-16 MED ORDER — INSULIN ASPART 100 UNIT/ML ~~LOC~~ SOLN
0.0000 [IU] | Freq: Three times a day (TID) | SUBCUTANEOUS | Status: DC
  Administered 2016-03-18: 5 [IU] via SUBCUTANEOUS

## 2016-03-16 MED ORDER — MAGNESIUM HYDROXIDE 400 MG/5ML PO SUSP: 30.0000 mL | Freq: Every day | ORAL | Status: DC | PRN

## 2016-03-16 NOTE — Consult Note (Signed)
Reason for Consult:AS/ CAD Referring Physician: Dr. Lamar Benes Mark Cabrera is an 66 y.o. male.  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 He presents with a 6 month history of dyspnea with exertion. Over the past several weeks it has become more frequent and severe. It has gotten to the point he would be SOB with walking across a room and he would gasp for breath while talking. HE recently has been unable to lie on his back due to dyspnea.  He had a repeat echo in December which showed progression of his AS. His mean gradient was 47 and his peak 78 mmHg. His calculated valve area was 0.87 cm2. His EF was normal and he had grade 2 diastolic dysfunction.  Yesterday he had cardiac catheterization which revealed a mean gradient of 38 mmHg, peak of 58 mmHg, elevated filling pressures and 2 vessel CAD- tight RCA stenosis, moderate circumflex. He had plaque in LAD but no hemodynamically significant.  He says he had some teeth extracted by a dentist 2 months ago but does not have any active dental issues. HE does not check his blood sugar at home.  He feels much less SOB since receiving IV lasix yesterday.  Past Medical History:  Diagnosis Date  . Acute diastolic congestive heart failure ([place])   . Atypical nevi   . Diabetes mellitus   . Dyspnea   . GERD (gastroesophageal reflux disease)   . Hyperlipidemia    hx of transaminitis secondary to statin and he has decided not to use statins secondary to potential side effects.  . Hypertension   . Nephrolithiasis   . Osteoarthritis, knee   . Sleep apnea     Central apnea. Occasionally wears his CPAP at night.  . Transaminitis     Statin-induced  . 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 MRI of the brain 2007 showed acute left lateral medullary infarct  in the distribution of left posterior inferior cerebral artery , narrowing of the left vertebral with severe diminution of flow or acute occlusion. 2-D echo was normal no embolic source found.    Past Surgical History:  Procedure Laterality Date  . CARDIAC CATHETERIZATION N/A 03/15/2016   Procedure: Right/Left Heart Cath and Coronary Angiography;  Surgeon: Burnell Blanks, MD;  Location: [facility];  Service: Cardiovascular;  Laterality: N/A;  . KNEE ARTHROSCOPY W/ PARTIAL MEDIAL MENISCECTOMY  05/12/2005   right, performed by Dr. French Ana for torn medial meniscus.  Marland Kitchen ROTATOR CUFF REPAIR Right 2016    Family History  Problem Relation Age of Onset  . Hypertension Mother   . Diabetes Mother   . Alcohol abuse Father     Social History:  reports that he has never smoked. He has never used smokeless tobacco. He reports that he drinks alcohol. He reports that he does not use drugs.  Allergies:  Allergies  Allergen Reactions  . Crab (Diagnostic) Anaphylaxis  . Penicillins Other (See Comments)    unknown    Medications:  Scheduled: . aspirin EC  81 mg Oral Daily  . furosemide  40 mg Intravenous Q12H  . gabapentin  100 mg Oral Daily  . glipiZIDE  20 mg Oral Daily  . insulin aspart  0-15 Units Subcutaneous TID WC  . losartan  50 mg Oral Daily  . sodium chloride flush  3 mL Intravenous Q12H    Results for orders placed or performed during the hospital encounter of 03/15/16 (from the past 48 hour(s))  Glucose, capillary     Status: Abnormal   Collection Time: 03/15/16  6:39 AM  Result Value Ref Range   Glucose-Capillary 159 (H) 65 - 99 mg/dL  Glucose, capillary     Status: Abnormal   Collection Time: 03/15/16 10:45 AM  Result Value Ref Range   Glucose-Capillary 190 (H) 65 - 99 mg/dL  Glucose, capillary     Status: Abnormal   Collection Time: 03/15/16  1:49 PM  Result Value Ref Range   Glucose-Capillary 323 (H) 65 - 99 mg/dL   Comment 1 Notify RN    Comment 2  Document in Chart   CBC     Status: Abnormal   Collection Time: 03/16/16  2:19 AM  Result Value Ref Range   WBC 8.4 4.0 - 10.5 K/uL   RBC 3.94 (L) 4.22 - 5.81 MIL/uL   Hemoglobin 11.2 (L) 13.0 - 17.0 g/dL   HCT 33.5 (L) 39.0 - 52.0 %   MCV 85.0 78.0 - 100.0 fL   MCH 28.4 26.0 - 34.0 pg   MCHC 33.4 30.0 - 36.0 g/dL   RDW 13.1 11.5 - 15.5 %   Platelets 236 150 - 400 K/uL  Basic metabolic panel     Status: Abnormal   Collection Time: 03/16/16  2:19 AM  Result Value Ref Range   Sodium 138 135 - 145 mmol/L   Potassium 3.9 3.5 - 5.1 mmol/L   Chloride 103 101 - 111 mmol/L   CO2 26 22 - 32 mmol/L   Glucose, Bld 229 (H) 65 - 99 mg/dL   BUN 16 6 - 20 mg/dL   Creatinine, Ser 1.01 0.61 - 1.24 mg/dL   Calcium 9.3 8.9 - 10.3 mg/dL   GFR calc non Af Amer >60 >60 mL/min   GFR calc Af Amer >60 >60 mL/min    Comment: (NOTE) The eGFR has been calculated using the CKD EPI equation. This calculation has not been validated in all clinical situations. eGFR's persistently <60 mL/min signify possible Chronic Kidney Disease.    Anion gap 9 5 - 15    No results found.  Review of Systems  Constitutional: Positive for malaise/fatigue. Negative for chills and fever.  Eyes: Negative for blurred vision and double vision.  Respiratory: Positive for shortness of breath and wheezing.   Cardiovascular: Positive for orthopnea, leg swelling and PND. Negative for claudication.  Gastrointestinal: Positive for heartburn. Negative for blood in stool.  Genitourinary: Negative for dysuria and frequency.  Musculoskeletal: Positive for falls.  Neurological: Negative for focal weakness and loss of consciousness.       Poor equilibrium since stroke. Has to be careful when he walks. If he looks upward he will fall.   Blood pressure 117/64, pulse 70, temperature 97.3 F (36.3 C), temperature source Oral, resp. rate 20, height 5' 8" (1.727 m), weight 230 lb (104.3 kg), SpO2 100 %. Physical Exam  Vitals  reviewed. Constitutional: He is oriented to person, place, and time. He appears well-developed and well-nourished. No distress.  HENT:  Head: Normocephalic and atraumatic.  Poor dentition  Eyes: Conjunctivae and EOM are normal. Pupils are equal, round, and reactive to light. No scleral icterus.  Neck: Neck supple. No thyromegaly present.  Cardiovascular: Normal rate and regular rhythm.   Murmur (3/6 crescendo- decrescendo at RUSB) heard. Respiratory: Effort normal. No respiratory distress. He has no  wheezes. He has no rales.  GI: Soft. He exhibits no distension. There is no tenderness.  Musculoskeletal: Normal range of motion. He exhibits edema (1+).  Lymphadenopathy:    He has no cervical adenopathy.  Neurological: He is alert and oriented to person, place, and time. No cranial nerve deficit.  Motor 5/5 in all 4 ext  Skin: Skin is warm and dry.   ECHOCARDIOGRAM Study Conclusions  - Left ventricle: The cavity size was normal. Wall thickness was   increased in a pattern of moderate LVH. Systolic function was   normal. The estimated ejection fraction was in the range of 55%   to 60%. Wall motion was normal; there were no regional wall   motion abnormalities. Features are consistent with a pseudonormal   left ventricular filling pattern, with concomitant abnormal   relaxation and increased filling pressure (grade 2 diastolic   dysfunction). - Aortic valve: Trileaflet; severely calcified leaflets. There was   severe stenosis. There was trivial regurgitation. Mean gradient   (S): 47 mm Hg. Peak gradient (S): 78 mm Hg. Valve area (VTI):   0.87 cm^2. - Mitral valve: There was mild regurgitation. - Left atrium: The atrium was moderately dilated. - Right ventricle: The cavity size was normal. Systolic function   was normal. - Right atrium: The atrium was mildly dilated. - Pulmonary arteries: No complete TR doppler jet so unable to   estimate PA systolic pressure. - Inferior vena cava:  The vessel was normal in size. The   respirophasic diameter changes were in the normal range (>= 50%),   consistent with normal central venous pressure.  Impressions:  - Normal LV size with moderate LV hypertrophy. EF 55-60%. Moderate   diastolic dysfunction. Normal RV size and systolic function.   Severe aortic stenosis. Mild mitral regurgitation.  CARDIAC CATHETERIZATION Conclusion     There is severe aortic valve stenosis.  Mid RCA lesion, 20 %stenosed.  Dist RCA lesion, 95 %stenosed.  Ost LM lesion, 20 %stenosed.  Prox Cx lesion, 50 %stenosed.  Mid LAD lesion, 40 %stenosed.  Hemodynamic findings consistent with moderate pulmonary hypertension.   1. Severe stenosis distal RCA 2. Moderate stenosis proximal Circumflex and mid LAD 3. Severe aortic stenosis (peak to peak gradient 58 mmHg, mean 38 mmHg, AVA 0.75cm2) 4. Elevated filling pressures.   Recommendations: Will admit for diuresis. Filling pressures elevated. Pt now dyspneic at rest. Will ask CT surgery to see him to discuss planning for CABG and AVR. He will likely need several days of diuresis with IV Lasix.     I personally reviewed the cath and echo images and concur with the findings noted above  Assessment/Plan: 66 yo man with multiple CRF who presents with decompensated diastolic left heart failure manifested by orthopnea and DOE. He has severe AS and significant 2 vessel CAD.   AVR CABG indicated for survival benefit and relief of symptoms.  I discussed the general nature of the procedure, the need for general anesthesia, the need for cardiopulmonary bypass, and the incisions to be used with Mark Cabrera. We discussed the expected hospital stay, overall recovery and short and long term outcomes. I informed him of the indications, risks, benefits and alternatives. He understands the risks include, but are not limited to death, stroke, MI, DVT/PE, bleeding, possible need for transfusion, infections, heart  block requiring pacemaker placement, cardiac arrhythmias, as well as other organ system dysfunction including respiratory, renal, or GI complications.   He accepts the risks and agrees to proceed.  We discussed mechanical v tissue valves and their relative advantages and disadvantages. Given age a tissue valve is the preferred option, especially in light of his social situation and variable compliance.  AS of now 1st available date for surgery is a week from today. May be able to go home and come back after he is tuned up medically.  [name] 03/16/2016, 2:31 PM

## 2016-03-16 NOTE — Progress Notes (Signed)
Patient Name: Mark Cabrera Date of Encounter: 03/16/2016  Primary Cardiologist: Emilie Rutter, M.D.  Hospital Problem List     Active Problems:   Acute diastolic CHF (congestive heart failure) (HCC)     Subjective   Outpatient cardiac catheterization performed yesterday, leading to admission because of acute diastolic heart failure precipitated by progressive severe aortic stenosis and superimposed coronary disease. Several week history of orthopnea and PND.  Inpatient Medications    Scheduled Meds: . aspirin EC  81 mg Oral Daily  . furosemide  40 mg Intravenous Q12H  . gabapentin  100 mg Oral Daily  . glipiZIDE  20 mg Oral Daily  . losartan  50 mg Oral Daily  . sodium chloride flush  3 mL Intravenous Q12H   Continuous Infusions:  PRN Meds: sodium chloride, acetaminophen, ondansetron (ZOFRAN) IV, sodium chloride flush   Vital Signs    Vitals:   03/15/16 1250 03/15/16 1320 03/15/16 1345 03/16/16 0621  BP: (!) 116/57 (!) 100/52 127/84 139/71  Pulse: 75 70 83 76  Resp: 15 13 18 18   Temp:   98 F (36.7 C) 97.3 F (36.3 C)  TempSrc:   Oral Oral  SpO2: 95% 97% 99% 100%  Weight:      Height:        Intake/Output Summary (Last 24 hours) at 03/16/16 0949 Last data filed at 03/15/16 2300  Gross per 24 hour  Intake                0 ml  Output             3600 ml  Net            -3600 ml   Filed Weights   03/15/16 0638  Weight: 230 lb (104.3 kg)    Physical Exam   Relatively young-appearing. No acute distress. GEN: Well nourished, well developed, in no acute distress.  HEENT: Grossly normal.  Neck: Supple, no JVD, carotid bruits, or masses. Cardiac: RRR,  rubs, or gallops. No clubbing, cyanosis, edema.  Grade 4/6 crescendo decrescendo systolic murmur right upper sternal border and left lower sternal border and apex consistent with aortic stenosis. Radials/DP/PT 2+ and equal bilaterally. No edema is noted. Respiratory:  Respirations regular and unlabored, clear  to auscultation bilaterally. GI: Soft, nontender, nondistended, BS + x 4. MS: no deformity or atrophy. Skin: warm and dry, no rash. Neuro:  Strength and sensation are intact. Psych: AAOx3.  Normal affect.  Labs    CBC  Recent Labs  03/16/16 0219  WBC 8.4  HGB 11.2*  HCT 33.5*  MCV 85.0  PLT [id number]   Basic Metabolic Panel  Recent Labs  03/16/16 0219  NA 138  K 3.9  CL 103  CO2 26  GLUCOSE 229*  BUN 16  CREATININE 1.01  CALCIUM 9.3     Telemetry    Normal sinus rhythm - Personally Reviewed  ECG    Performed on 03/10/16, normal sinus rhythm with nonspecific T-wave abnormality and prominent voltage. - Personally Reviewed  Radiology    No results found.  Cardiac Studies   Cardiac catheterization 03/10/16: 1. Severe stenosis distal RCA 2. Moderate stenosis proximal Circumflex and mid LAD 3. Severe aortic stenosis (peak to peak gradient 58 mmHg, mean 38 mmHg, AVA 0.75cm2) 4. Elevated filling pressures.   Echocardiogram 02/16/16: Study Conclusions  - Left ventricle: The cavity size was normal. Wall thickness was   increased in a pattern of moderate LVH. Systolic function was  normal. The estimated ejection fraction was in the range of 55%   to 60%. Wall motion was normal; there were no regional wall   motion abnormalities. Features are consistent with a pseudonormal   left ventricular filling pattern, with concomitant abnormal   relaxation and increased filling pressure (grade 2 diastolic   dysfunction). - Aortic valve: Trileaflet; severely calcified leaflets. There was   severe stenosis. There was trivial regurgitation. Mean gradient   (S): 47 mm Hg. Peak gradient (S): 78 mm Hg. Valve area (VTI):   0.87 cm^2. - Mitral valve: There was mild regurgitation. - Left atrium: The atrium was moderately dilated. - Right ventricle: The cavity size was normal. Systolic function   was normal. - Right atrium: The atrium was mildly dilated. - Pulmonary arteries:  No complete TR doppler jet so unable to   estimate PA systolic pressure. - Inferior vena cava: The vessel was normal in size. The   respirophasic diameter changes were in the normal range (>= 50%),   consistent with normal central venous pressure.  Impressions:  - Normal LV size with moderate LV hypertrophy. EF 55-60%. Moderate   diastolic dysfunction. Normal RV size and systolic function.   Severe aortic stenosis. Mild mitral regurgitation.    Patient Profile     66 year old gentleman admitted with acute on chronic diastolic heart failure due to severely stenosed aortic valve. Heart catheterization yesterday also demonstrated concomitant coronary artery disease.  Assessment & Plan    1. Critical calcific aortic stenosis, severe with a mean catheter-based gradient of 39 mmHg. Calculated aortic valve area 0.75 cm. Surgical consultation to determine timing of aortic valve replacement and concomitant one area bypass surgery. Will potentially be able to go home and return electively for surgery assuming no in-hospital complications with diuresis. 2. Moderate pulmonary hypertension secondary to diastolic heart failure and aortic stenosis. Mean capillary wedge pressure 35 mmHg. LVEDP 45 mmHg. 3. Acute on chronic diastolic heart failure with marked pressure overload as noted above. Overall plan is to diurese the patient carefully, attempting to avoid hypotension. Continue IV diuresis at least 24 additional hours. 4. Significant single-vessel coronary disease. 5. Diabetes mellitus,2. Level of control is uncertain. 6. Essential hypertension 7. History of vertebral artery dissection  Signed, Sinclair Grooms, MD  03/16/2016, 9:49 AM

## 2016-03-16 NOTE — Progress Notes (Signed)
Pre-op Cardiac Surgery  Carotid Findings:   Findings are consistent with a 1-39 percent stenosis involving the right internal carotid artery and the left internal carotid artery. The vertebral arteries demonstrate antegrade flow.  Upper Extremity Right Left  Brachial Pressures 108  Triphasic 104  Triphasic  Radial Waveforms Triphasic Triphasic  Ulnar Waveforms Triphasic Triphasic  Palmar Arch (Allen's Test) Palmar waveforms remain within normal limits with radial and ulnar compression. Palmar waveforms remain within normal limits with radial compression and are obliterated with ulnar compression.    Lower  Extremity Right Left  Dorsalis Pedis 106 129  Posterior Tibial 120 134  Ankle/Brachial Indices 1.11 1.24   Findings:   Right ABI of 1.11 and left ABI of 1.24 are suggestive of arterial flow within normal limits at rest.

## 2016-03-16 NOTE — Progress Notes (Signed)
Inpatient Diabetes Program Recommendations  AACE/ADA: New Consensus Statement on Inpatient Glycemic Control (2015)  Target Ranges:  Prepandial:   less than 140 mg/dL      Peak postprandial:   less than 180 mg/dL (1-2 hours)      Critically ill patients:  140 - 180 mg/dL   Lab Results  Component Value Date   GLUCAP 323 (H) 03/15/2016   HGBA1C 6.6 12/15/2013    Review of Glycemic Control Results for Mark Cabrera, Mark Cabrera (MRN [id number]) as of 03/16/2016 10:05  Ref. Range 03/15/2016 06:39 03/15/2016 10:45 03/15/2016 13:49  Glucose-Capillary Latest Ref Range: 65 - 99 mg/dL 159 (H) 190 (H) 323 (H)   Diabetes history: DM2 Outpatient Diabetes medications: Glucotrol 20 mg +Metformin 1 gm bid Current orders for Inpatient glycemic control: Glucotrol 20 mg qd  Inpatient Diabetes Program Recommendations:    Please consider: -Novolog correction 0-9 units tid + 0-5 units hs -A1c to determine prehospital glycemic control  Thank you, Bethena Roys E. Kaleiah Kutzer, RN, MSN, CDE Inpatient Glycemic Control Team Team Pager [phone] (8am-5pm) 03/16/2016 10:14 AM

## 2016-03-16 NOTE — Progress Notes (Signed)
CARDIAC REHAB PHASE I   PRE:  Rate/Rhythm: 92 SR  BP:  Sitting: 159/83        SaO2: 100 RA  MODE:  Ambulation: 350 ft   POST:  Rate/Rhythm: 80 SR  BP:  Sitting: 126/70         SaO2: 99 RA  Pt very talkative, some DOE at rest with conversation noted, difficult to direct conversation at times. Pt agreeable to walk, ambulated 350 ft on RA, hand held assist, occasional loss of balance, but mostly steady gait, tolerated fairly well. Pt c/o dizziness, DOE, declined rest stop. SBP lower upon return to room. Cardiacsurgery pre-op education completed with pt. Reviewed IS, sternal precautions, activity progression, cardiac surgery booklet and cardiac surgery guidelines. Pt verbalized understanding, declined to view surgery videos at this time. Pt lives a very minimalist lifestyle, does state he has family support and will be able to stay with his brother for a period of time after surgery. While sitting in recliner, pt began to complain of some chest discomfort, RN notified. Pt to recliner after walk, call bell within reach. Will follow.   [id number] Lenna Sciara, RN, BSN 03/16/2016 11:47 AM

## 2016-03-17 ENCOUNTER — Inpatient Hospital Stay (HOSPITAL_COMMUNITY): Payer: Medicare Other

## 2016-03-17 LAB — VAS US DOPPLER PRE CABG
LCCADDIAS: 23 cm/s
LCCADSYS: 68 cm/s
LCCAPDIAS: 20 cm/s
LEFT ECA DIAS: -18 cm/s
LEFT VERTEBRAL DIAS: 9 cm/s
LICADSYS: -81 cm/s
LICAPDIAS: 27 cm/s
LICAPSYS: 104 cm/s
Left CCA prox sys: 87 cm/s
Left ICA dist dias: -26 cm/s
RCCADSYS: -93 cm/s
RIGHT ECA DIAS: -10 cm/s
RIGHT VERTEBRAL DIAS: 16 cm/s

## 2016-03-17 LAB — GLUCOSE, CAPILLARY
GLUCOSE-CAPILLARY: 167 mg/dL — AB (ref 65–99)
GLUCOSE-CAPILLARY: 132 mg/dL — AB (ref 65–99)
Glucose-Capillary: 255 mg/dL — ABNORMAL HIGH (ref 65–99)
Glucose-Capillary: 220 mg/dL — ABNORMAL HIGH (ref 65–99)

## 2016-03-17 MED ORDER — FUROSEMIDE 40 MG PO TABS
40.0000 mg | ORAL_TABLET | Freq: Every day | ORAL | Status: DC
  Administered 2016-03-17 – 2016-03-18 (×2): 40 mg via ORAL
  Filled 2016-03-17 (×2): qty 1

## 2016-03-17 NOTE — Progress Notes (Addendum)
Patient Name: Mark Cabrera Date of Encounter: 03/17/2016  Primary Cardiologist: Emilie Rutter, M.D.  Hospital Problem List     Principal Problem:   Acute diastolic CHF (congestive heart failure) (HCC) Active Problems:   Diabetes mellitus with neurological manifestation (HCC)   HYPERTENSION, BENIGN ESSENTIAL, CONTROLLED   Critical aortic valve stenosis   Coronary artery disease involving native coronary artery of native heart without angina pectoris     Subjective   Significant clinical improvement with an hospital diuresis. Seen by surgery, Dr. Roxan Hockey and planned aortic valve replacement with CABG in one week. "May be able to go home and come back after he is tuned up medically".  Inpatient Medications    Scheduled Meds: . aspirin EC  81 mg Oral Daily  . furosemide  40 mg Intravenous Q12H  . furosemide  40 mg Oral Daily  . gabapentin  100 mg Oral Daily  . glipiZIDE  20 mg Oral Daily  . insulin aspart  0-15 Units Subcutaneous TID WC  . losartan  50 mg Oral Daily  . sodium chloride flush  3 mL Intravenous Q12H   Continuous Infusions:  PRN Meds: sodium chloride, acetaminophen, magnesium hydroxide, ondansetron (ZOFRAN) IV, sodium chloride flush   Vital Signs    Vitals:   03/16/16 0621 03/16/16 1404 03/16/16 2122 03/17/16 0506  BP: 139/71 117/64 128/90 (!) 111/57  Pulse: 76 70 66 71  Resp: 18 20 20 20   Temp: 97.3 F (36.3 C) 97.3 F (36.3 C) 97.5 F (36.4 C) 98.1 F (36.7 C)  TempSrc: Oral Oral Oral Oral  SpO2: 100% 100% 100% 96%  Weight:      Height:        Intake/Output Summary (Last 24 hours) at 03/17/16 0830 Last data filed at 03/17/16 0500  Gross per 24 hour  Intake              480 ml  Output             1550 ml  Net            -1070 ml   Filed Weights   03/15/16 0638  Weight: 230 lb (104.3 kg)    Physical Exam   Relatively young-appearing. No acute distress. GEN: Well nourished, well developed, in no acute distress.  HEENT: Grossly  normal.  Neck: Supple, no JVD, carotid bruits, or masses. Cardiac: RRR,  rubs, or gallops. No clubbing, cyanosis, edema.  Grade 4/6 crescendo decrescendo systolic murmur right upper sternal border and left lower sternal border and apex consistent with aortic stenosis. Radials/DP/PT 2+ and equal bilaterally. No edema is noted. Respiratory:  Respirations regular and unlabored, clear to auscultation bilaterally. GI: Soft, nontender, nondistended, BS + x 4. MS: no deformity or atrophy. Skin: warm and dry, no rash. Neuro:  Strength and sensation are intact. Psych: AAOx3.  Normal affect.  Labs    CBC  Recent Labs  03/16/16 0219  WBC 8.4  HGB 11.2*  HCT 33.5*  MCV 85.0  PLT [id number]   Basic Metabolic Panel  Recent Labs  03/16/16 0219  NA 138  K 3.9  CL 103  CO2 26  GLUCOSE 229*  BUN 16  CREATININE 1.01  CALCIUM 9.3     Telemetry    Normal sinus rhythm - Personally Reviewed  ECG    Performed on 03/10/16, normal sinus rhythm with nonspecific T-wave abnormality and prominent voltage. - Personally Reviewed  Radiology    No results found.  Cardiac Studies  Cardiac catheterization 03/10/16: 1. Severe stenosis distal RCA 2. Moderate stenosis proximal Circumflex and mid LAD 3. Severe aortic stenosis (peak to peak gradient 58 mmHg, mean 38 mmHg, AVA 0.75cm2) 4. Elevated filling pressures.   Echocardiogram 02/16/16: Study Conclusions  - Left ventricle: The cavity size was normal. Wall thickness was   increased in a pattern of moderate LVH. Systolic function was   normal. The estimated ejection fraction was in the range of 55%   to 60%. Wall motion was normal; there were no regional wall   motion abnormalities. Features are consistent with a pseudonormal   left ventricular filling pattern, with concomitant abnormal   relaxation and increased filling pressure (grade 2 diastolic   dysfunction). - Aortic valve: Trileaflet; severely calcified leaflets. There was   severe  stenosis. There was trivial regurgitation. Mean gradient   (S): 47 mm Hg. Peak gradient (S): 78 mm Hg. Valve area (VTI):   0.87 cm^2. - Mitral valve: There was mild regurgitation. - Left atrium: The atrium was moderately dilated. - Right ventricle: The cavity size was normal. Systolic function   was normal. - Right atrium: The atrium was mildly dilated. - Pulmonary arteries: No complete TR doppler jet so unable to   estimate PA systolic pressure. - Inferior vena cava: The vessel was normal in size. The   respirophasic diameter changes were in the normal range (>= 50%),   consistent with normal central venous pressure.  Impressions:  - Normal LV size with moderate LV hypertrophy. EF 55-60%. Moderate   diastolic dysfunction. Normal RV size and systolic function.   Severe aortic stenosis. Mild mitral regurgitation.    Patient Profile     66 year old gentleman admitted with acute on chronic diastolic heart failure due to severely stenosed aortic valve. Heart catheterization yesterday also demonstrated concomitant coronary artery disease.Seen by Dr. Modesto Charon and surgery is planned for 03/23/2016  Assessment & Plan    1. Critical calcific aortic stenosis, severe with a mean catheter-based gradient of 39 mmHg. Calculated aortic valve area 0.75 cm. Surgical consultation to determine timing of aortic valve replacement and concomitant oneVessel bypass surgery. Could potentially be able to go home and return electively for surgery. Need to decide on final diuretic dose in a.m. Discussed with Dr. Roxan Hockey.  2. Moderate pulmonary hypertension , Clinically improved with reduction in jugular vein distention.  3. Acute on chronic diastolic heart failure, He has had an excellent diuresis and marked improvement in symptoms. Will convert to oral diuretic therapy today . Check kidney function in a.m. 4. Significant single-vessel coronary disease. 5. Diabetes mellitus,2. Level of control  is uncertain. 6. Essential hypertension 7. History of vertebral artery dissection  Signed, Sinclair Grooms, MD  03/17/2016, 8:30 AM

## 2016-03-17 NOTE — Progress Notes (Signed)
Pt has been lightly walking to nurses station. Declines walking now. He is eager to go home and sts he will walk there. Discussed with him the balance of mobility but not pushing himself to cause angina/HF. Warned against walking outside in cold temperatures and encouraged wearing something over his mouth. Voiced understanding. He plans to stay at his sisters. [place], ACSM 11:55 AM 03/17/2016

## 2016-03-17 NOTE — Progress Notes (Signed)
      [address]       [place] [address]             [phone]      No complaints  BP 126/86 (BP Location: Left Arm)   Pulse 66   Temp 98.1 F (36.7 C) (Oral)   Resp 18   Ht 5\' 8"  (1.727 m)   Wt 230 lb (104.3 kg)   SpO2 100%   BMI 34.97 kg/m    Intake/Output Summary (Last 24 hours) at 03/17/16 1751 Last data filed at 03/17/16 1300  Gross per 24 hour  Intake              960 ml  Output             1750 ml  Net             -790 ml    Edema improved Lungs clear  66 yo man with severe AS, CAD and diastolic heart failure  His heart failure is improved.I think he is OK to laeve the hospital and come back if his living arrangement is acceptable.  Plan AVR CABG on Thursday 1/11  Revonda Standard. Roxan Hockey, MD [facility] [phone]

## 2016-03-17 NOTE — Care Management Note (Signed)
Case Management Note Marvetta Gibbons RN, BSN Unit 2W-Case Manager [phone]  Patient Details  Name: Mark Cabrera MRN: [id number] Date of Birth: 1950-07-24  Subjective/Objective:    Pt admitted with Acute CHF, found CAD- needs CABG- plan for 03/23/16                Action/Plan: PTA pt lived in shed- per MD note with no electricity or heat- also has no bathroom. - CSW consulted for possible SNF placement post-op due to housing issues.   Expected Discharge Date:                  Expected Discharge Plan:  Skilled Nursing Facility  In-House Referral:  Clinical Social Work  Discharge planning Services  CM Consult  Post Acute Care Choice:    Choice offered to:     DME Arranged:    DME Agency:     HH Arranged:    [place] Agency:     Status of Service:  In process, will continue to follow  If discussed at Long Length of Stay Meetings, dates discussed:    Additional Comments:  Dawayne Patricia, RN 03/17/2016, 3:58 PM

## 2016-03-18 LAB — BASIC METABOLIC PANEL
ANION GAP: 10 (ref 5–15)
BUN: 18 mg/dL (ref 6–20)
CO2: 28 mmol/L (ref 22–32)
Calcium: 9.1 mg/dL (ref 8.9–10.3)
Chloride: 100 mmol/L — ABNORMAL LOW (ref 101–111)
Creatinine, Ser: 0.92 mg/dL (ref 0.61–1.24)
GFR calc Af Amer: >60 mL/min (ref >60)
GFR calc non Af Amer: >60 mL/min (ref >60)
GLUCOSE: 223 mg/dL — AB (ref 65–99)
POTASSIUM: 3.4 mmol/L — AB (ref 3.5–5.1)
Sodium: 138 mmol/L (ref 135–145)

## 2016-03-18 LAB — GLUCOSE, CAPILLARY
Glucose-Capillary: 165 mg/dL — ABNORMAL HIGH (ref 65–99)
Glucose-Capillary: 225 mg/dL — ABNORMAL HIGH (ref 65–99)

## 2016-03-18 LAB — HEMOGLOBIN A1C
HEMOGLOBIN A1C: 7 % — AB (ref 4.8–5.6)
Mean Plasma Glucose: 154 mg/dL

## 2016-03-18 MED ORDER — LOSARTAN POTASSIUM 50 MG PO TABS
50.0000 mg | ORAL_TABLET | Freq: Every day | ORAL | 3 refills | Status: DC
Start: 2016-03-19 — End: 2016-03-30

## 2016-03-18 MED ORDER — POTASSIUM CHLORIDE CRYS ER 20 MEQ PO TBCR: 20.0000 meq | EXTENDED_RELEASE_TABLET | Freq: Every day | ORAL | 11 refills | Status: DC

## 2016-03-18 MED ORDER — ATORVASTATIN CALCIUM 80 MG PO TABS: 80.0000 mg | ORAL_TABLET | Freq: Every day | ORAL | Status: DC

## 2016-03-18 MED ORDER — POTASSIUM CHLORIDE CRYS ER 20 MEQ PO TBCR: 20.0000 meq | EXTENDED_RELEASE_TABLET | Freq: Every day | ORAL | Status: DC

## 2016-03-18 MED ORDER — GABAPENTIN 100 MG PO CAPS: 100.0000 mg | ORAL_CAPSULE | Freq: Every day | ORAL | Status: DC

## 2016-03-18 MED ORDER — ACETAMINOPHEN 325 MG PO TABS: 650.0000 mg | ORAL_TABLET | ORAL | Status: DC | PRN

## 2016-03-18 MED ORDER — ATORVASTATIN CALCIUM 80 MG PO TABS: 80.0000 mg | ORAL_TABLET | Freq: Every day | ORAL | 3 refills | Status: DC

## 2016-03-18 MED ORDER — FUROSEMIDE 40 MG PO TABS: 40.0000 mg | ORAL_TABLET | Freq: Every day | ORAL | 11 refills | Status: DC

## 2016-03-18 NOTE — Progress Notes (Addendum)
Patient Name: Mark Cabrera Date of Encounter: 03/18/2016  Primary Cardiologist: Emilie Rutter, M.D.  Hospital Problem List     Principal Problem:   Acute diastolic CHF (congestive heart failure) (HCC) Active Problems:   Diabetes mellitus with neurological manifestation (HCC)   HYPERTENSION, BENIGN ESSENTIAL, CONTROLLED   Critical aortic valve stenosis   Coronary artery disease involving native coronary artery of native heart without angina pectoris     Subjective   Significant clinical improvement with an hospital diuresis. Seen by surgery, Dr. Roxan Hockey and planned aortic valve replacement with CABG in one week. "May be able to go home and come back after he is tuned up medically".  Inpatient Medications    Scheduled Meds: . aspirin EC  81 mg Oral Daily  . furosemide  40 mg Oral Daily  . gabapentin  100 mg Oral Daily  . glipiZIDE  20 mg Oral Daily  . insulin aspart  0-15 Units Subcutaneous TID WC  . losartan  50 mg Oral Daily  . sodium chloride flush  3 mL Intravenous Q12H   Continuous Infusions:  PRN Meds: sodium chloride, acetaminophen, magnesium hydroxide, ondansetron (ZOFRAN) IV, sodium chloride flush   Vital Signs    Vitals:   03/17/16 0506 03/17/16 1412 03/17/16 2017 03/18/16 0500  BP: (!) 111/57 126/86 134/66 115/74  Pulse: 71 66 81 66  Resp: 20 18 18 18   Temp: 98.1 F (36.7 C) 98.1 F (36.7 C) 98.4 F (36.9 C) 97.5 F (36.4 C)  TempSrc: Oral Oral Oral Oral  SpO2: 96% 100% 100% 100%  Weight:    221 lb 8 oz (100.5 kg)  Height:        Intake/Output Summary (Last 24 hours) at 03/18/16 1026 Last data filed at 03/18/16 0900  Gross per 24 hour  Intake              720 ml  Output             2050 ml  Net            -1330 ml   Filed Weights   03/15/16 0638 03/18/16 0500  Weight: 230 lb (104.3 kg) 221 lb 8 oz (100.5 kg)    Physical Exam   Relatively young-appearing. No acute distress. GEN: Well nourished, well developed, in no acute distress.    HEENT: Grossly normal.  Neck: Supple, no JVD, carotid bruits, or masses. Cardiac: RRR,  rubs, or gallops. No clubbing, cyanosis, edema.  Grade 4/6 crescendo decrescendo systolic murmur right upper sternal border and left lower sternal border and apex consistent with aortic stenosis. Radials/DP/PT 2+ and equal bilaterally. No edema is noted. Respiratory:  Respirations regular and unlabored, clear to auscultation bilaterally. GI: Soft, nontender, nondistended, BS + x 4. MS: no deformity or atrophy. Skin: warm and dry, no rash. Neuro:  Strength and sensation are intact. Psych: AAOx3.  Normal affect.  Labs    CBC  Recent Labs  03/16/16 0219  WBC 8.4  HGB 11.2*  HCT 33.5*  MCV 85.0  PLT [id number]   Basic Metabolic Panel  Recent Labs  03/16/16 0219 03/18/16 0320  NA 138 138  K 3.9 3.4*  CL 103 100*  CO2 26 28  GLUCOSE 229* 223*  BUN 16 18  CREATININE 1.01 0.92  CALCIUM 9.3 9.1     Telemetry    Normal sinus rhythm - Personally Reviewed  ECG    Performed on 03/10/16, normal sinus rhythm with nonspecific T-wave abnormality and prominent voltage. -  Personally Reviewed  Radiology    Dg Chest 2 View  Result Date: 03/17/2016 CLINICAL DATA:  CHF. EXAM: CHEST  2 VIEW COMPARISON:  02/07/2016.  03/04/2010. FINDINGS: Mediastinum and hilar structures normal. Cardiomegaly with mild pulmonary vascular prominence. Mild bilateral interstitial prominence. Findings suggest mild CHF. Small left pleural effusion. No pneumothorax . IMPRESSION: Cardiomegaly with mild bilateral interstitial prominence and small left pleural effusion consistent with mild CHF. Electronically Signed   By: Marcello Moores  Register   On: 03/17/2016 09:02    Cardiac Studies   Cardiac catheterization 03/10/16: 1. Severe stenosis distal RCA 2. Moderate stenosis proximal Circumflex and mid LAD 3. Severe aortic stenosis (peak to peak gradient 58 mmHg, mean 38 mmHg, AVA 0.75cm2) 4. Elevated filling pressures.    Echocardiogram 02/16/16: Study Conclusions  - Left ventricle: The cavity size was normal. Wall thickness was   increased in a pattern of moderate LVH. Systolic function was   normal. The estimated ejection fraction was in the range of 55%   to 60%. Wall motion was normal; there were no regional wall   motion abnormalities. Features are consistent with a pseudonormal   left ventricular filling pattern, with concomitant abnormal   relaxation and increased filling pressure (grade 2 diastolic   dysfunction). - Aortic valve: Trileaflet; severely calcified leaflets. There was   severe stenosis. There was trivial regurgitation. Mean gradient   (S): 47 mm Hg. Peak gradient (S): 78 mm Hg. Valve area (VTI):   0.87 cm^2. - Mitral valve: There was mild regurgitation. - Left atrium: The atrium was moderately dilated. - Right ventricle: The cavity size was normal. Systolic function   was normal. - Right atrium: The atrium was mildly dilated. - Pulmonary arteries: No complete TR doppler jet so unable to   estimate PA systolic pressure. - Inferior vena cava: The vessel was normal in size. The   respirophasic diameter changes were in the normal range (>= 50%),   consistent with normal central venous pressure.  Impressions:  - Normal LV size with moderate LV hypertrophy. EF 55-60%. Moderate   diastolic dysfunction. Normal RV size and systolic function.   Severe aortic stenosis. Mild mitral regurgitation.    Patient Profile     66 year old gentleman admitted with acute on chronic diastolic heart failure due to severely stenosed aortic valve. Heart catheterization yesterday also demonstrated concomitant coronary artery disease.Seen by Dr. Modesto Charon and surgery is planned for 03/23/2016  Assessment & Plan    1. Critical calcific aortic stenosis, severe with a mean catheter-based gradient of 39 mmHg. Calculated aortic valve area 0.75 cm. Appreciate surgical consultation to  determine timing of aortic valve replacement and concomitant one Vessel bypass surgery. Could potentially be able to go home and return electively for surgery. This was discussed with Dr. Roxan Hockey.  2. Moderate pulmonary hypertension - Clinically improved with reduction in jugular vein distention.   3. Acute on chronic diastolic heart failure, He has had an excellent diuresis and marked improvement in symptoms. He is now on oral diuretics.  Renal function stable.  Will replete potassium.    4. Significant single-vessel coronary disease.  For 1 vessel CABG at time of AVR.  Continue ASA.  Start high dose statin with Lipitor 80mg  daily.  He will need followup FLP and ALT in 6 weeks.   5. Diabetes mellitus,2. HbA1C 7.  Continue Glipizide.  OK to restart Metformin (this was on hold post cath).  6. Essential hypertension - BP controlled on current meds.  Continue ARB.  7. History of vertebral artery dissection  He is stable for discharge today.  Will d/c home and followup with Dr. Roxan Hockey at time of surgery on 1/11.  Signed, Fransico Him, MD  03/18/2016, 10:26 AM

## 2016-03-18 NOTE — Discharge Summary (Signed)
Patient ID: Mark Cabrera,  MRN: [id number], DOB/AGE: 1950-11-15 66 y.o.  Admit date: 03/15/2016 Discharge date: 03/18/2016  Primary Care Provider: [name] Kerbs, DO Primary Cardiologist: Dr Julianne Handler  Discharge Diagnoses Principal Problem:   Acute diastolic CHF (congestive heart failure) ([place]) Active Problems:   Diabetes mellitus with neurological manifestation ([facility])   Dyslipidemia   HYPERTENSION, BENIGN ESSENTIAL, CONTROLLED   Critical aortic valve stenosis   Coronary artery disease involving native coronary artery of native heart without angina pectoris    Procedures: Rt and Lt heart cath 03/15/16   Hospital Course:  66 y/o male, lives in a house with dirt floor, no electricity besides solar panels and no running water. He has been followed for a history of AS. He was seen in the office 03/10/16 with dyspnea and set up for an OP Rt and Lt heart cath 03/15/16. This revealed severe distal RCA disease of 95%, 40% mLAD, and 50%pCFX. There was severe AS with a pk gradient of 58 mmHg, mean 38 mmHg with an AVA 0.75cm2. The pt also had elevated LVEDP and Lasix was added. He was seen in consult by Dr Roxan Hockey 03/16/16 and set up for surgery Jan 11th. Dr Radford Pax feels the pt is stable for discharge 03/18/16.  Discharge Vitals:  Blood pressure 115/74, pulse 66, temperature 97.5 F (36.4 C), temperature source Oral, resp. rate 18, height 5\' 8"  (1.727 m), weight 221 lb 8 oz (100.5 kg), SpO2 100 %.    Labs: Results for orders placed or performed during the hospital encounter of 03/15/16 (from the past 24 hour(s))  Glucose, capillary     Status: Abnormal   Collection Time: 03/17/16 11:46 AM  Result Value Ref Range   Glucose-Capillary 255 (H) 65 - 99 mg/dL   Comment 1 Notify RN    Comment 2 Document in Chart   Glucose, capillary     Status: Abnormal   Collection Time: 03/17/16  4:04 PM  Result Value Ref Range   Glucose-Capillary 132 (H) 65 - 99 mg/dL  Glucose, capillary     Status:  Abnormal   Collection Time: 03/17/16  9:16 PM  Result Value Ref Range   Glucose-Capillary 220 (H) 65 - 99 mg/dL  Basic metabolic panel     Status: Abnormal   Collection Time: 03/18/16  3:20 AM  Result Value Ref Range   Sodium 138 135 - 145 mmol/L   Potassium 3.4 (L) 3.5 - 5.1 mmol/L   Chloride 100 (L) 101 - 111 mmol/L   CO2 28 22 - 32 mmol/L   Glucose, Bld 223 (H) 65 - 99 mg/dL   BUN 18 6 - 20 mg/dL   Creatinine, Ser 0.92 0.61 - 1.24 mg/dL   Calcium 9.1 8.9 - 10.3 mg/dL   GFR calc non Af Amer >60 >60 mL/min   GFR calc Af Amer >60 >60 mL/min   Anion gap 10 5 - 15  Glucose, capillary     Status: Abnormal   Collection Time: 03/18/16  6:19 AM  Result Value Ref Range   Glucose-Capillary 165 (H) 65 - 99 mg/dL    Disposition:  Follow-up Information    [name], MD Follow up.   Specialty:  Cardiothoracic Surgery Why:  Return as directed for surgery next week Contact information: [address] [place] [address] [phone]           Discharge Medications:  Allergies as of 03/18/2016      Reactions  Crab (diagnostic) Anaphylaxis   Penicillins Other (See Comments)   unknown      Medication List    STOP taking these medications   enalapril-hydrochlorothiazide 10-25 MG tablet Commonly known as:  VASERETIC     TAKE these medications   acetaminophen 325 MG tablet Commonly known as:  TYLENOL Take 2 tablets (650 mg total) by mouth every 4 (four) hours as needed for headache or mild pain.   aspirin 81 MG tablet Take 81 mg by mouth daily.   atorvastatin 80 MG tablet Commonly known as:  LIPITOR Take 1 tablet (80 mg total) by mouth daily at 6 PM.   furosemide 40 MG tablet Commonly known as:  LASIX Take 1 tablet (40 mg total) by mouth daily. Start taking on:  03/19/2016   gabapentin 100 MG capsule Commonly known as:  NEURONTIN Take 1 capsule (100 mg total) by mouth daily.   GLIPIZIDE XL 10 MG 24 hr tablet Generic drug:   glipiZIDE Take 20 mg by mouth daily.   losartan 50 MG tablet Commonly known as:  COZAAR Take 1 tablet (50 mg total) by mouth daily. Start taking on:  03/19/2016   metFORMIN 500 MG 24 hr tablet Commonly known as:  GLUCOPHAGE-XR Take 1,000 mg by mouth 2 (two) times daily with a meal.   potassium chloride SA 20 MEQ tablet Commonly known as:  K-DUR,KLOR-CON Take 1 tablet (20 mEq total) by mouth daily.        Duration of Discharge Encounter: Greater than 30 minutes including physician time.  [name] PA-C 03/18/2016 11:20 AM

## 2016-03-18 NOTE — Discharge Instructions (Signed)
Heart Failure °Heart failure is a condition in which the heart has trouble pumping blood because it has become weak or stiff. This means that the heart does not pump blood efficiently for the body to work well. For some people with heart failure, fluid may back up into the lungs and there may be swelling (edema) in the lower legs. Heart failure is usually a long-term (chronic) condition. It is important for you to take good care of yourself and follow the treatment plan from your health care provider. °What are the causes? °This condition is caused by some health problems, including: °· High blood pressure (hypertension). Hypertension causes the heart muscle to work harder than normal. High blood pressure eventually causes the heart to become stiff and weak. °· Coronary artery disease (CAD). CAD is the buildup of cholesterol and fat (plaques) in the arteries of the heart. °· Heart attack (myocardial infarction). Injured tissue, which is caused by the heart attack, does not contract as well and the heart's ability to pump blood is weakened. °· Abnormal heart valves. When the heart valves do not open and close properly, the heart muscle must pump harder to keep the blood flowing. °· Heart muscle disease (cardiomyopathy or myocarditis). Heart muscle disease is damage to the heart muscle from a variety of causes, such as drug or alcohol abuse, infections, or unknown causes. These can increase the risk of heart failure. °· Lung disease. When the lungs do not work properly, the heart must work harder. °What increases the risk? °Risk of heart failure increases as a person ages. This condition is also more likely to develop in people who: °· Are overweight. °· Are male. °· Smoke or chew tobacco. °· Abuse alcohol or illegal drugs. °· Have taken medicines that can damage the heart, such as chemotherapy drugs. °· Have diabetes. °¨ High blood sugar (glucose) is associated with high fat (lipid) levels in the blood. °¨ Diabetes  can also damage tiny blood vessels that carry nutrients to the heart muscle. °· Have abnormal heart rhythms. °· Have thyroid problems. °· Have low blood counts (anemia). °What are the signs or symptoms? °Symptoms of this condition include: °· Shortness of breath with activity, such as when climbing stairs. °· Persistent cough. °· Swelling of the feet, ankles, legs, or abdomen. °· Unexplained weight gain. °· Difficulty breathing when lying flat (orthopnea). °· Waking from sleep because of the need to sit up and get more air. °· Rapid heartbeat. °· Fatigue and loss of energy. °· Feeling light-headed, dizzy, or close to fainting. °· Loss of appetite. °· Nausea. °· Increased urination during the night (nocturia). °· Confusion. °How is this diagnosed? °This condition is diagnosed based on: °· Medical history, symptoms, and a physical exam. °· Diagnostic tests, which may include: °¨ Echocardiogram. °¨ Electrocardiogram (ECG). °¨ Chest X-ray. °¨ Blood tests. °¨ Exercise stress test. °¨ Radionuclide scans. °¨ Cardiac catheterization and angiogram. °How is this treated? °Treatment for this condition is aimed at managing the symptoms of heart failure. Medicines, behavioral changes, or other treatments may be necessary to treat heart failure. °Medicines  °These may include: °· Angiotensin-converting enzyme (ACE) inhibitors. This type of medicine blocks the effects of a blood protein called angiotensin-converting enzyme. ACE inhibitors relax (dilate) the blood vessels and help to lower blood pressure. °· Angiotensin receptor blockers (ARBs). This type of medicine blocks the actions of a blood protein called angiotensin. ARBs dilate the blood vessels and help to lower blood pressure. °· Water pills (diuretics). Diuretics   cause the kidneys to remove salt and water from the blood. The extra fluid is removed through urination, leaving a lower volume of blood that the heart has to pump.  Beta blockers. These improve heart muscle  strength and they prevent the heart from beating too quickly.  Digoxin. This increases the force of the heartbeat. Healthy behavior changes  These may include:  Reaching and maintaining a healthy weight.  Stopping smoking or chewing tobacco.  Eating heart-healthy foods.  Limiting or avoiding alcohol.  Stopping use of street drugs (illegal drugs).  Physical activity. Other treatments  These may include:  Surgery to open blocked coronary arteries or repair damaged heart valves.  Placement of a biventricular pacemaker to improve heart muscle function (cardiac resynchronization therapy). This device paces both the right ventricle and left ventricle.  Placement of a device to treat serious abnormal heart rhythms (implantable cardioverter defibrillator, or ICD).  Placement of a device to improve the pumping ability of the heart (left ventricular assist device, or LVAD).  Heart transplant. This can cure heart failure, and it is considered for certain patients who do not improve with other therapies. Follow these instructions at home: Medicines  Take over-the-counter and prescription medicines only as told by your health care provider. Medicines are important in reducing the workload of your heart, slowing the progression of heart failure, and improving your symptoms.  Do not stop taking your medicine unless your health care provider told you to do that.  Do not skip any dose of medicine.  Refill your prescriptions before you run out of medicine. You need your medicines every day. Eating and drinking  Eat heart-healthy foods. Talk with a dietitian to make an eating plan that is right for you.  Choose foods that contain no trans fat and are low in saturated fat and cholesterol. Healthy choices include fresh or frozen fruits and vegetables, fish, lean meats, legumes, fat-free or low-fat dairy products, and whole-grain or high-fiber foods.  Limit salt (sodium) if directed by your  health care provider. Sodium restriction may reduce symptoms of heart failure. Ask a dietitian to recommend heart-healthy seasonings.  Use healthy cooking methods instead of frying. Healthy methods include roasting, grilling, broiling, baking, poaching, steaming, and stir-frying.  Limit your fluid intake if directed by your health care provider. Fluid restriction may reduce symptoms of heart failure. Lifestyle  Stop smoking or using chewing tobacco. Nicotine and tobacco can damage your heart and your blood vessels. Do not use nicotine gum or patches before talking to your health care provider.  Limit alcohol intake to no more than 1 drink per day for non-pregnant women and 2 drinks per day for men. One drink equals 12 oz of beer, 5 oz of wine, or 1 oz of hard liquor.  Drinking more than that is harmful to your heart. Tell your health care provider if you drink alcohol several times a week.  Talk with your health care provider about whether any level of alcohol use is safe for you.  If your heart has already been damaged by alcohol or you have severe heart failure, drinking alcohol should be stopped completely.  Stop use of illegal drugs.  Lose weight if directed by your health care provider. Weight loss may reduce symptoms of heart failure.  Do moderate physical activity if directed by your health care provider. People who are elderly and people with severe heart failure should consult with a health care provider for physical activity recommendations. Monitor important information  Weigh  yourself every day. Keeping track of your weight daily helps you to notice excess fluid sooner.  Weigh yourself every morning after you urinate and before you eat breakfast.  Wear the same amount of clothing each time you weigh yourself.  Record your daily weight. Provide your health care provider with your weight record.  Monitor and record your blood pressure as told by your health care  provider.  Check your pulse as told by your health care provider. Dealing with extreme temperatures  If the weather is extremely hot:  Avoid vigorous physical activity.  Use air conditioning or fans or seek a cooler location.  Avoid caffeine and alcohol.  Wear loose-fitting, lightweight, and light-colored clothing.  If the weather is extremely cold:  Avoid vigorous physical activity.  Layer your clothes.  Wear mittens or gloves, a hat, and a scarf when you go outside.  Avoid alcohol. General instructions  Manage other health conditions such as hypertension, diabetes, thyroid disease, or abnormal heart rhythms as told by your health care provider.  Learn to manage stress. If you need help to do this, ask your health care provider.  Plan rest periods when fatigued.  Get ongoing education and support as needed.  Participate in or seek rehabilitation as needed to maintain or improve independence and quality of life.  Stay up to date with immunizations. Keeping current on pneumococcal and influenza immunizations is especially important to prevent respiratory infections.  Keep all follow-up visits as told by your health care provider. This is important. Contact a health care provider if:  You have a rapid weight gain.  You have increasing shortness of breath that is unusual for you.  You are unable to participate in your usual physical activities.  You tire easily.  You cough more than normal, especially with physical activity.  You have any swelling or more swelling in areas such as your hands, feet, ankles, or abdomen.  You are unable to sleep because it is hard to breathe.  You feel like your heart is beating quickly (palpitations).  You become dizzy or light-headed when you stand up. Get help right away if:  You have difficulty breathing.  You notice or your family notices a change in your awareness, such as having trouble staying awake or having difficulty  with concentration.  You have pain or discomfort in your chest.  You have an episode of fainting (syncope). This information is not intended to replace advice given to you by your health care provider. Make sure you discuss any questions you have with your health care provider. Document Released: 02/27/2005 Document Revised: 11/02/2015 Document Reviewed: 09/22/2015 Elsevier Interactive Patient Education  2017 Elsevier Inc.   Heart Disease Prevention Heart disease is a leading cause of death. There are many things you can do to help prevent heart disease. Be physically active Physical activity is good for your heart. It helps control your blood pressure, cholesterol levels, and weight. Try to be physically active every day. Ask your health care provider what activities are best for you. Be a healthy weight Extra weight can strain your heart and affect your blood pressure and cholesterol levels. Lose weight with diet and exercise if recommended by your health care provider. Eat heart-healthy foods Follow a healthy eating plan as recommended by your health care provider or dietitian. Heart-healthy foods include:  High-fiber foods. These include oat bran, oatmeal, and whole-grain breads and cereals.  Fruits and vegetables. Avoid:  Alcohol.  Fried foods.  Foods high in saturated  fat. These include meats, butter, whole dairy products, shortening, and coconut or palm oil.  Salty foods. These include canned food, luncheon meat, salty snacks, and fast food. Keep your cholesterol levels under control Cholesterol is a substance that is used for many important functions. When your cholesterol levels are high, cholesterol can stick to the insides of your blood vessels, making them narrow or clog. This can lead to chest pain (angina) and a heart attack. Keep your cholesterol levels under control as recommended by your health care provider. Have your cholesterol checked at least once a year.  Target cholesterol levels (in mg/dL) for most people are:  Total cholesterol below 200.  LDL cholesterol below 100.  HDL cholesterol above 40 in men and above 50 in women.  Triglycerides below 150. Keep your blood pressure under control Having high blood pressure (hypertension) puts you at risk for stroke and other forms of heart disease. Keep your blood pressure under control as recommended by your health care provider. Ask your health care provider if you need treatment to lower your blood pressure. If you are 38-62 years of age, have your blood pressure checked every 3-5 years. If you are 80 years of age or older, have your blood pressure checked every year. Do not use tobacco products Tobacco smoke can damage your heart and blood vessels. Do not use any tobacco products including cigarettes, chewing tobacco, or electronic cigarettes. If you need help quitting, ask your health care provider. Take medicines as directed Take medicines only as directed by your health care provider. Ask your health care provider whether you should take an aspirin every day. Taking aspirin can help reduce your risk of heart disease and stroke. Where to find more information: To find out more about heart disease, visit the American Heart Association's website at www.americanheart.org This information is not intended to replace advice given to you by your health care provider. Make sure you discuss any questions you have with your health care provider. Document Released: 10/12/2003 Document Revised: 07/28/2015 Document Reviewed: 04/23/2013 Elsevier Interactive Patient Education  2017 Else[employer] Inc.  Furosemide tablets What is this medicine? FUROSEMIDE (fyoor [place] se mide) is a diuretic. It helps you make more urine and to lose salt and excess water from your body. This medicine is used to treat high blood pressure, and edema or swelling from heart, kidney, or liver disease. This medicine may be used for other  purposes; ask your health care provider or pharmacist if you have questions. COMMON BRAND NAME(S): Active-Medicated Specimen Kit, Delone, Diuscreen, Lasix, RX Specimen Collection Kit, Specimen Collection Kit, URINX Medicated Specimen Collection What should I tell my health care provider before I take this medicine? They need to know if you have any of these conditions: -abnormal blood electrolytes -diarrhea or vomiting -gout -heart disease -kidney disease, small amounts of urine, or difficulty passing urine -liver disease -thyroid disease -an unusual or allergic reaction to furosemide, sulfa drugs, other medicines, foods, dyes, or preservatives -pregnant or trying to get pregnant -breast-feeding How should I use this medicine? Take this medicine by mouth with a glass of water. Follow the directions on the prescription label. You may take this medicine with or without food. If it upsets your stomach, take it with food or milk. Do not take your medicine more often than directed. Remember that you will need to pass more urine after taking this medicine. Do not take your medicine at a time of day that will cause you problems. Do not take  at bedtime. Talk to your pediatrician regarding the use of this medicine in children. While this drug may be prescribed for selected conditions, precautions do apply. Overdosage: If you think you have taken too much of this medicine contact a poison control center or emergency room at once. NOTE: This medicine is only for you. Do not share this medicine with others. What if I miss a dose? If you miss a dose, take it as soon as you can. If it is almost time for your next dose, take only that dose. Do not take double or extra doses. What may interact with this medicine? -aspirin and aspirin-like medicines -certain antibiotics -chloral hydrate -cisplatin -cyclosporine -digoxin -diuretics -laxatives -lithium -medicines for blood pressure -medicines that relax  muscles for surgery -methotrexate -NSAIDs, medicines for pain and inflammation like ibuprofen, naproxen, or indomethacin -phenytoin -steroid medicines like prednisone or cortisone -sucralfate -thyroid hormones This list may not describe all possible interactions. Give your health care provider a list of all the medicines, herbs, non-prescription drugs, or dietary supplements you use. Also tell them if you smoke, drink alcohol, or use illegal drugs. Some items may interact with your medicine. What should I watch for while using this medicine? Visit your doctor or health care professional for regular checks on your progress. Check your blood pressure regularly. Ask your doctor or health care professional what your blood pressure should be, and when you should contact him or her. If you are a diabetic, check your blood sugar as directed. You may need to be on a special diet while taking this medicine. Check with your doctor. Also, ask how many glasses of fluid you need to drink a day. You must not get dehydrated. You may get drowsy or dizzy. Do not drive, use machinery, or do anything that needs mental alertness until you know how this drug affects you. Do not stand or sit up quickly, especially if you are an older patient. This reduces the risk of dizzy or fainting spells. Alcohol can make you more drowsy and dizzy. Avoid alcoholic drinks. This medicine can make you more sensitive to the sun. Keep out of the sun. If you cannot avoid being in the sun, wear protective clothing and use sunscreen. Do not use sun lamps or tanning beds/booths. What side effects may I notice from receiving this medicine? Side effects that you should report to your doctor or health care professional as soon as possible: -blood in urine or stools -dry mouth -fever or chills -hearing loss or ringing in the ears -irregular heartbeat -muscle pain or weakness, cramps -skin rash -stomach upset, pain, or nausea -tingling or  numbness in the hands or feet -unusually weak or tired -vomiting or diarrhea -yellowing of the eyes or skin Side effects that usually do not require medical attention (report to your doctor or health care professional if they continue or are bothersome): -headache -loss of appetite -unusual bleeding or bruising This list may not describe all possible side effects. Call your doctor for medical advice about side effects. You may report side effects to FDA at 1-800-FDA-1088. Where should I keep my medicine? Keep out of the reach of children. Store at room temperature between 15 and 30 degrees C (59 and 86 degrees F). Protect from light. Throw away any unused medicine after the expiration date. NOTE: This sheet is a summary. It may not cover all possible information. If you have questions about this medicine, talk to your doctor, pharmacist, or health care provider.  2017 Elsevier/Gold  Standard (2014-05-20 13:49:50)

## 2016-03-20 ENCOUNTER — Emergency Department (HOSPITAL_COMMUNITY)
Admission: EM | Admit: 2016-03-20 | Discharge: 2016-03-21 | Disposition: A | Discharge status: Home / Self Care | Payer: Medicare Other | Attending: Dermatology | Admitting: Dermatology

## 2016-03-20 ENCOUNTER — Other Ambulatory Visit: Payer: Self-pay | Admitting: *Deleted

## 2016-03-20 ENCOUNTER — Other Ambulatory Visit (HOSPITAL_COMMUNITY): Payer: Self-pay | Admitting: Pharmacy Technician

## 2016-03-20 ENCOUNTER — Emergency Department (HOSPITAL_COMMUNITY): Payer: Medicare Other

## 2016-03-20 ENCOUNTER — Encounter (HOSPITAL_COMMUNITY): Payer: Self-pay | Admitting: Emergency Medicine

## 2016-03-20 DIAGNOSIS — I5031 Acute diastolic (congestive) heart failure: Secondary | ICD-10-CM | POA: Diagnosis not present

## 2016-03-20 DIAGNOSIS — I110 Hypertensive heart disease with heart failure: Secondary | ICD-10-CM | POA: Diagnosis not present

## 2016-03-20 DIAGNOSIS — I350 Nonrheumatic aortic (valve) stenosis: Secondary | ICD-10-CM

## 2016-03-20 DIAGNOSIS — R079 Chest pain, unspecified: Secondary | ICD-10-CM | POA: Diagnosis not present

## 2016-03-20 DIAGNOSIS — Z5321 Procedure and treatment not carried out due to patient leaving prior to being seen by health care provider: Secondary | ICD-10-CM | POA: Diagnosis not present

## 2016-03-20 DIAGNOSIS — Z7984 Long term (current) use of oral hypoglycemic drugs: Secondary | ICD-10-CM | POA: Diagnosis not present

## 2016-03-20 DIAGNOSIS — Z7982 Long term (current) use of aspirin: Secondary | ICD-10-CM | POA: Diagnosis not present

## 2016-03-20 DIAGNOSIS — I2510 Atherosclerotic heart disease of native coronary artery without angina pectoris: Secondary | ICD-10-CM | POA: Insufficient documentation

## 2016-03-20 DIAGNOSIS — Z8673 Personal history of transient ischemic attack (TIA), and cerebral infarction without residual deficits: Secondary | ICD-10-CM | POA: Diagnosis not present

## 2016-03-20 DIAGNOSIS — R05 Cough: Secondary | ICD-10-CM | POA: Diagnosis not present

## 2016-03-20 LAB — BASIC METABOLIC PANEL
ANION GAP: 10 (ref 5–15)
BUN: 12 mg/dL (ref 6–20)
CALCIUM: 9.3 mg/dL (ref 8.9–10.3)
CO2: 23 mmol/L (ref 22–32)
Chloride: 101 mmol/L (ref 101–111)
Creatinine, Ser: 0.97 mg/dL (ref 0.61–1.24)
GFR calc Af Amer: >60 mL/min (ref >60)
GLUCOSE: 171 mg/dL — AB (ref 65–99)
Potassium: 3.7 mmol/L (ref 3.5–5.1)
SODIUM: 134 mmol/L — AB (ref 135–145)

## 2016-03-20 LAB — CBC
HCT: 34.3 % — ABNORMAL LOW (ref 39.0–52.0)
HEMOGLOBIN: 11.5 g/dL — AB (ref 13.0–17.0)
MCH: 28.5 pg (ref 26.0–34.0)
MCHC: 33.5 g/dL (ref 30.0–36.0)
MCV: 84.9 fL (ref 78.0–100.0)
Platelets: 186 10*3/uL (ref 150–400)
RBC: 4.04 MIL/uL — ABNORMAL LOW (ref 4.22–5.81)
RDW: 13.2 % (ref 11.5–15.5)
WBC: 5.4 10*3/uL (ref 4.0–10.5)

## 2016-03-20 LAB — I-STAT TROPONIN, ED: TROPONIN I, POC: 0.04 ng/mL (ref 0.00–0.08)

## 2016-03-20 NOTE — ED Notes (Signed)
Attempted to locate pt in lobby. Unable to locate pt or family

## 2016-03-20 NOTE — ED Triage Notes (Signed)
Pt presents to ED for assessment of chest pain.  Pt had recent stent placement and is supposed to have open heart surgery tomorrow.  Pt sts they gave him a new BP medication and he thinks he is having a reaction to it.  Pt sts he feels like he has the flu, he has throat pain and swelling, headache, and cough.  Pt c/o SOB.  Pt denies n/v/d.

## 2016-03-21 ENCOUNTER — Ambulatory Visit (HOSPITAL_COMMUNITY)
Admission: RE | Admit: 2016-03-21 | Discharge: 2016-03-21 | Disposition: A | Discharge status: Home / Self Care | Payer: Medicare Other | Source: Ambulatory Visit | Attending: Thoracic Surgery (Cardiothoracic Vascular Surgery) | Admitting: Thoracic Surgery (Cardiothoracic Vascular Surgery)

## 2016-03-21 ENCOUNTER — Other Ambulatory Visit: Payer: Self-pay | Admitting: *Deleted

## 2016-03-21 ENCOUNTER — Encounter (HOSPITAL_COMMUNITY): Payer: Self-pay

## 2016-03-21 ENCOUNTER — Encounter (HOSPITAL_COMMUNITY)
Admission: RE | Admit: 2016-03-21 | Discharge: 2016-03-21 | Disposition: A | Discharge status: Home / Self Care | Payer: Medicare Other | Source: Ambulatory Visit | Attending: Thoracic Surgery (Cardiothoracic Vascular Surgery) | Admitting: Thoracic Surgery (Cardiothoracic Vascular Surgery)

## 2016-03-21 ENCOUNTER — Other Ambulatory Visit (HOSPITAL_COMMUNITY): Payer: Self-pay | Admitting: *Deleted

## 2016-03-21 ENCOUNTER — Encounter (HOSPITAL_COMMUNITY): Payer: Medicare Other

## 2016-03-21 DIAGNOSIS — R079 Chest pain, unspecified: Secondary | ICD-10-CM | POA: Diagnosis not present

## 2016-03-21 DIAGNOSIS — Z7982 Long term (current) use of aspirin: Secondary | ICD-10-CM | POA: Diagnosis not present

## 2016-03-21 DIAGNOSIS — I2510 Atherosclerotic heart disease of native coronary artery without angina pectoris: Secondary | ICD-10-CM | POA: Diagnosis not present

## 2016-03-21 DIAGNOSIS — I110 Hypertensive heart disease with heart failure: Secondary | ICD-10-CM | POA: Diagnosis not present

## 2016-03-21 DIAGNOSIS — Z7984 Long term (current) use of oral hypoglycemic drugs: Secondary | ICD-10-CM | POA: Diagnosis not present

## 2016-03-21 DIAGNOSIS — I350 Nonrheumatic aortic (valve) stenosis: Secondary | ICD-10-CM

## 2016-03-21 DIAGNOSIS — I5031 Acute diastolic (congestive) heart failure: Secondary | ICD-10-CM | POA: Diagnosis not present

## 2016-03-21 DIAGNOSIS — Z5321 Procedure and treatment not carried out due to patient leaving prior to being seen by health care provider: Secondary | ICD-10-CM | POA: Diagnosis not present

## 2016-03-21 DIAGNOSIS — Z8673 Personal history of transient ischemic attack (TIA), and cerebral infarction without residual deficits: Secondary | ICD-10-CM | POA: Diagnosis not present

## 2016-03-21 HISTORY — DX: Atherosclerotic heart disease of native coronary artery without angina pectoris: I25.10

## 2016-03-21 HISTORY — DX: Cerebral infarction, unspecified: I63.9

## 2016-03-21 HISTORY — DX: Major depressive disorder, single episode, unspecified: F32.9

## 2016-03-21 HISTORY — DX: Cardiac murmur, unspecified: R01.1

## 2016-03-21 HISTORY — DX: Depression, unspecified: F32.A

## 2016-03-21 HISTORY — DX: Nonrheumatic aortic (valve) stenosis: I35.0

## 2016-03-21 HISTORY — DX: Personal history of urinary calculi: Z87.442

## 2016-03-21 LAB — PULMONARY FUNCTION TEST
DL/VA % PRED: 84 %
DL/VA: 3.81 ml/min/mmHg/L
DLCO unc % pred: 67 %
DLCO unc: 20 ml/min/mmHg
FEF 25-75 POST: 2.19 L/s
FEF 25-75 Pre: 1.93 L/sec
FEF2575-%Change-Post: 13 %
FEF2575-%PRED-PRE: 79 %
FEF2575-%Pred-Post: 90 %
FEV1-%CHANGE-POST: 2 %
FEV1-%PRED-PRE: 84 %
FEV1-%Pred-Post: 85 %
FEV1-PRE: 2.61 L
FEV1-Post: 2.66 L
FEV1FVC-%CHANGE-POST: 1 %
FEV1FVC-%Pred-Pre: 100 %
FEV6-%Change-Post: -5 %
FEV6-%PRED-PRE: 87 %
FEV6-%Pred-Post: 82 %
FEV6-POST: 3.26 L
FEV6-Pre: 3.45 L
FEV6FVC-%Change-Post: 1 %
FEV6FVC-%PRED-POST: 106 %
FEV6FVC-%Pred-Pre: 104 %
FVC-%CHANGE-POST: 0 %
FVC-%PRED-PRE: 83 %
FVC-%Pred-Post: 84 %
FVC-POST: 3.53 L
FVC-PRE: 3.51 L
POST FEV6/FVC RATIO: 100 %
PRE FEV1/FVC RATIO: 74 %
Post FEV1/FVC ratio: 75 %
Pre FEV6/FVC Ratio: 98 %
RV % pred: 142 %
RV: 3.26 L
TLC % PRED: 103 %
TLC: 6.82 L

## 2016-03-21 LAB — BLOOD GAS, ARTERIAL
ACID-BASE DEFICIT: 0 mmol/L (ref 0.0–2.0)
Bicarbonate: 23.2 mmol/L (ref 20.0–28.0)
DRAWN BY: 470591
FIO2: 21
O2 SAT: 97.5 %
PATIENT TEMPERATURE: 98.6
pCO2 arterial: 31.6 mmHg — ABNORMAL LOW (ref 32.0–48.0)
pH, Arterial: 7.478 — ABNORMAL HIGH (ref 7.350–7.450)
pO2, Arterial: 91.4 mmHg (ref 83.0–108.0)

## 2016-03-21 LAB — URINALYSIS, ROUTINE W REFLEX MICROSCOPIC
Bacteria, UA: NONE SEEN
Bilirubin Urine: NEGATIVE
Hgb urine dipstick: NEGATIVE
Ketones, ur: 5 mg/dL — AB
Leukocytes, UA: NEGATIVE
Nitrite: NEGATIVE
PROTEIN: NEGATIVE mg/dL
SPECIFIC GRAVITY, URINE: 1.025 (ref 1.005–1.030)
SQUAMOUS EPITHELIAL / LPF: NONE SEEN
pH: 5 (ref 5.0–8.0)

## 2016-03-21 LAB — GLUCOSE, CAPILLARY: GLUCOSE-CAPILLARY: 227 mg/dL — AB (ref 65–99)

## 2016-03-21 LAB — COMPREHENSIVE METABOLIC PANEL
ALBUMIN: 3.7 g/dL (ref 3.5–5.0)
ALK PHOS: 60 U/L (ref 38–126)
ALT: 26 U/L (ref 17–63)
ANION GAP: 8 (ref 5–15)
AST: 29 U/L (ref 15–41)
BUN: 14 mg/dL (ref 6–20)
CALCIUM: 9.2 mg/dL (ref 8.9–10.3)
CHLORIDE: 103 mmol/L (ref 101–111)
CO2: 23 mmol/L (ref 22–32)
Creatinine, Ser: 0.93 mg/dL (ref 0.61–1.24)
GFR calc non Af Amer: >60 mL/min (ref >60)
GLUCOSE: 208 mg/dL — AB (ref 65–99)
Potassium: 3.6 mmol/L (ref 3.5–5.1)
SODIUM: 134 mmol/L — AB (ref 135–145)
Total Bilirubin: 0.8 mg/dL (ref 0.3–1.2)
Total Protein: 7 g/dL (ref 6.5–8.1)

## 2016-03-21 LAB — SURGICAL PCR SCREEN
MRSA, PCR: NEGATIVE
STAPHYLOCOCCUS AUREUS: POSITIVE — AB

## 2016-03-21 LAB — PROTIME-INR
INR: 1.05
Prothrombin Time: 13.7 seconds (ref 11.4–15.2)

## 2016-03-21 LAB — APTT: APTT: 27 s (ref 24–36)

## 2016-03-21 MED ORDER — ALBUTEROL SULFATE (2.5 MG/3ML) 0.083% IN NEBU
2.5000 mg | INHALATION_SOLUTION | Freq: Once | RESPIRATORY_TRACT | Status: AC
Start: 2016-03-21 — End: 2016-03-21
  Administered 2016-03-21: 2.5 mg via RESPIRATORY_TRACT

## 2016-03-21 NOTE — Pre-Procedure Instructions (Signed)
Mark Cabrera  03/21/2016    Your procedure is scheduled on Thursday, March 23, 2016 at 8:00 AM.   Report to [facility] Entrance "A" Admitting Office at 6:00 AM.   Call this number if you have problems the morning of surgery: [phone]   Questions prior to day of surgery, please call [phone] between 8 & 4 PM.   Remember:  Do not eat food or drink liquids after midnight Wednesday, 03/22/16.  Take these medicines the morning of surgery with A SIP OF WATER: Gabapentin (Neurontin)   How to Manage Your Diabetes Before Surgery   Why is it important to control my blood sugar before and after surgery?   Improving blood sugar levels before and after surgery helps healing and can limit problems.  A way of improving blood sugar control is eating a healthy diet by:  - Eating less sugar and carbohydrates  - Increasing activity/exercise  - Talk with your doctor about reaching your blood sugar goals  High blood sugars (greater than 180 mg/dL) can raise your risk of infections and slow down your recovery so you will need to focus on controlling your diabetes during the weeks before surgery.  Make sure that the doctor who takes care of your diabetes knows about your planned surgery including the date and location.  How do I manage my blood sugars before surgery?   Check your blood sugar at least 4 times a day, 2 days before surgery to make sure that they are not too high or low.  Check your blood sugar the morning of your surgery when you wake up and every 2 hours until you get to the Short-Stay unit.  Treat a low blood sugar (less than 70 mg/dL) with 1/2 cup of clear juice (cranberry or apple), 4 glucose tablets, OR glucose gel.  Recheck blood sugar in 15 minutes after treatment (to make sure it is greater than 70 mg/dL).  If blood sugar is not greater than 70 mg/dL on re-check, call [phone] for further instructions.   Report your blood sugar to the Short-Stay  nurse when you get to Short-Stay.  References:  [facility], 2007 "How to Manage your Diabetes Before and After Surgery".  What do I do about my diabetes medications?   Do not take oral diabetes medicines (pills) the morning of surgery.    Do not wear jewelry.  Do not wear lotions, powders, cologne or deodorant.  Men may shave face and neck.  Do not bring valuables to the hospital.  [facility] is not responsible for any belongings or valuables.  Contacts, dentures or bridgework may not be worn into surgery.  Leave your suitcase in the car.  After surgery it may be brought to your room.  For patients admitted to the hospital, discharge time will be determined by your treatment team.  Special instructions:   - Preparing for Surgery  Before surgery, you can play an important role.  Because skin is not sterile, your skin needs to be as free of germs as possible.  You can reduce the number of germs on you skin by washing with CHG (chlorahexidine gluconate) soap before surgery.  CHG is an antiseptic cleaner which kills germs and bonds with the skin to continue killing germs even after washing.  Please DO NOT use if you have an allergy to CHG or antibacterial soaps.  If your skin becomes reddened/irritated stop using the CHG and inform your nurse when  you arrive at Short Stay.  Do not shave (including legs and underarms) for at least 48 hours prior to the first CHG shower.  You may shave your face.  Please follow these instructions carefully:   1.  Shower with CHG Soap the night before surgery and the                    morning of Surgery.  2.  If you choose to wash your hair, wash your hair first as usual with your       normal shampoo.  3.  After you shampoo, rinse your hair and body thoroughly to remove the shampoo.  4.  Use CHG as you would any other liquid soap.  You can apply chg directly       to the skin and wash gently with scrungie or a clean  washcloth.  5.  Apply the CHG Soap to your body ONLY FROM THE NECK DOWN.        Do not use on open wounds or open sores.  Avoid contact with your eyes, ears, mouth and genitals (private parts).  Wash genitals (private parts) with your normal soap.  6.  Wash thoroughly, paying special attention to the area where your surgery        will be performed.  7.  Thoroughly rinse your body with warm water from the neck down.  8.  DO NOT shower/wash with your normal soap after using and rinsing off       the CHG Soap.  9.  Pat yourself dry with a clean towel.            10.  Wear clean pajamas.            11.  Place clean sheets on your bed the night of your first shower and do not        sleep with pets.  Day of Surgery  Do not apply any lotions/deodorants the morning of surgery.  Please wear clean clothes to the hospital.   Please read over the fact sheets that you were given.

## 2016-03-21 NOTE — ED Notes (Signed)
Unable to locate pt  

## 2016-03-21 NOTE — Progress Notes (Signed)
Pt went to ED last pm due fever, cough, chills, aching all over. He stated that he feels it was a reaction to Losartan, so he has not taken his Losartan today. Pt does not have a fever today, but continues with a cough and achiness. He states he feels really "puny" and feeling dizzy. I called and spoke with Thurmond Butts, RN at Dr. Leonarda Salon office when pt first got here. She states for pt to continue to hold his Losartan at this time and she will talk with Dr. Roxan Hockey. Pt states that he lives alone in a house with no water, heats only by wood stove, has not toilet (uses outside toilet) and has no electricity. This lifestyle is his choice. He states his brother is allowing him to recuperate after surgery at his home for as long as he needs to do so. Called Ryan back after pt's appt and gave her this information on his living quarters. Also, stressed concern that he sounds like he might have the flu.

## 2016-03-21 NOTE — Progress Notes (Signed)
Mupirocin Ointment Rx called into [facility] on [address] for positive PCR of Staph. Spoke with pt's sister, Amado Nash and gave her results and need to pick up Rx. Pt's surgery has been moved Monday, 03/27/16 and pt will have plenty of time to do full doses. Sallie voiced understanding.

## 2016-03-29 MED ORDER — SODIUM CHLORIDE 0.9 % IV SOLN
1250.0000 mg | INTRAVENOUS | Status: AC
  Administered 2016-03-30: 1250 mg via INTRAVENOUS
  Filled 2016-03-29 (×2): qty 1250

## 2016-03-29 MED ORDER — PAPAVERINE HCL 30 MG/ML IJ SOLN
INTRAMUSCULAR | Status: AC
  Administered 2016-03-30: 09:00:00
  Filled 2016-03-29: qty 2.5

## 2016-03-29 MED ORDER — POTASSIUM CHLORIDE 2 MEQ/ML IV SOLN
80.0000 meq | INTRAVENOUS | Status: DC
  Filled 2016-03-29: qty 40

## 2016-03-29 MED ORDER — TRANEXAMIC ACID 1000 MG/10ML IV SOLN
1.5000 mg/kg/h | INTRAVENOUS | Status: AC
  Administered 2016-03-30: 1.5 mg/kg/h via INTRAVENOUS
  Filled 2016-03-29: qty 25

## 2016-03-29 MED ORDER — LEVOFLOXACIN IN D5W 500 MG/100ML IV SOLN
500.0000 mg | INTRAVENOUS | Status: AC
  Administered 2016-03-30: 500 mg via INTRAVENOUS
  Filled 2016-03-29 (×2): qty 100

## 2016-03-29 MED ORDER — MAGNESIUM SULFATE 50 % IJ SOLN
40.0000 meq | INTRAMUSCULAR | Status: DC
  Filled 2016-03-29: qty 10

## 2016-03-29 MED ORDER — NITROGLYCERIN IN D5W 200-5 MCG/ML-% IV SOLN
2.0000 ug/min | INTRAVENOUS | Status: DC
Start: 2016-03-30 — End: 2016-03-30
  Filled 2016-03-29: qty 250

## 2016-03-29 MED ORDER — SODIUM CHLORIDE 0.9 % IV SOLN
30.0000 ug/min | INTRAVENOUS | Status: AC
  Administered 2016-03-30: 20 ug/min via INTRAVENOUS
  Filled 2016-03-29: qty 2

## 2016-03-29 MED ORDER — SODIUM CHLORIDE 0.9 % IV SOLN
INTRAVENOUS | Status: DC
  Filled 2016-03-29: qty 30

## 2016-03-29 MED ORDER — DOPAMINE-DEXTROSE 3.2-5 MG/ML-% IV SOLN
0.0000 ug/kg/min | INTRAVENOUS | Status: DC
  Filled 2016-03-29: qty 250

## 2016-03-29 MED ORDER — EPINEPHRINE PF 1 MG/ML IJ SOLN
0.0000 ug/min | INTRAVENOUS | Status: DC
  Filled 2016-03-29: qty 4

## 2016-03-29 MED ORDER — DEXMEDETOMIDINE HCL IN NACL 400 MCG/100ML IV SOLN
0.1000 ug/kg/h | INTRAVENOUS | Status: AC
  Administered 2016-03-30: .4 ug/kg/h via INTRAVENOUS
  Filled 2016-03-29: qty 100

## 2016-03-29 MED ORDER — SODIUM CHLORIDE 0.9 % IV SOLN
INTRAVENOUS | Status: AC
  Administered 2016-03-30: 1 [IU]/h via INTRAVENOUS
  Filled 2016-03-29: qty 2.5

## 2016-03-29 MED ORDER — TRANEXAMIC ACID (OHS) PUMP PRIME SOLUTION
2.0000 mg/kg | INTRAVENOUS | Status: DC
Start: 2016-03-30 — End: 2016-03-30
  Filled 2016-03-29: qty 2

## 2016-03-29 MED ORDER — TRANEXAMIC ACID (OHS) BOLUS VIA INFUSION
15.0000 mg/kg | INTRAVENOUS | Status: AC
  Administered 2016-03-30: 1497 mg via INTRAVENOUS
  Filled 2016-03-29: qty 1497

## 2016-03-30 ENCOUNTER — Inpatient Hospital Stay (HOSPITAL_COMMUNITY): Payer: Medicare Other | Admitting: Certified Registered Nurse Anesthetist

## 2016-03-30 ENCOUNTER — Inpatient Hospital Stay (HOSPITAL_COMMUNITY)
Admission: RE | Admit: 2016-03-30 | Discharge: 2016-04-10 | DRG: 220 | Disposition: A | Discharge status: Home Health | Payer: Medicare Other | Source: Ambulatory Visit | Attending: Thoracic Surgery (Cardiothoracic Vascular Surgery) | Admitting: Thoracic Surgery (Cardiothoracic Vascular Surgery)

## 2016-03-30 ENCOUNTER — Inpatient Hospital Stay (HOSPITAL_COMMUNITY): Payer: Medicare Other

## 2016-03-30 ENCOUNTER — Encounter (HOSPITAL_COMMUNITY): Payer: Self-pay | Admitting: Certified Registered Nurse Anesthetist

## 2016-03-30 ENCOUNTER — Encounter (HOSPITAL_COMMUNITY)
Admission: RE | Disposition: A | Discharge status: Home Health | Payer: Self-pay | Source: Ambulatory Visit | Attending: Thoracic Surgery (Cardiothoracic Vascular Surgery)

## 2016-03-30 DIAGNOSIS — I480 Paroxysmal atrial fibrillation: Secondary | ICD-10-CM | POA: Diagnosis present

## 2016-03-30 DIAGNOSIS — R262 Difficulty in walking, not elsewhere classified: Secondary | ICD-10-CM

## 2016-03-30 DIAGNOSIS — R001 Bradycardia, unspecified: Secondary | ICD-10-CM | POA: Diagnosis not present

## 2016-03-30 DIAGNOSIS — D62 Acute posthemorrhagic anemia: Secondary | ICD-10-CM | POA: Diagnosis not present

## 2016-03-30 DIAGNOSIS — I5042 Chronic combined systolic (congestive) and diastolic (congestive) heart failure: Secondary | ICD-10-CM | POA: Diagnosis present

## 2016-03-30 DIAGNOSIS — R200 Anesthesia of skin: Secondary | ICD-10-CM | POA: Diagnosis not present

## 2016-03-30 DIAGNOSIS — I447 Left bundle-branch block, unspecified: Secondary | ICD-10-CM | POA: Diagnosis present

## 2016-03-30 DIAGNOSIS — Z833 Family history of diabetes mellitus: Secondary | ICD-10-CM

## 2016-03-30 DIAGNOSIS — E1140 Type 2 diabetes mellitus with diabetic neuropathy, unspecified: Secondary | ICD-10-CM | POA: Diagnosis present

## 2016-03-30 DIAGNOSIS — J9811 Atelectasis: Secondary | ICD-10-CM | POA: Diagnosis not present

## 2016-03-30 DIAGNOSIS — N179 Acute kidney failure, unspecified: Secondary | ICD-10-CM | POA: Diagnosis not present

## 2016-03-30 DIAGNOSIS — I959 Hypotension, unspecified: Secondary | ICD-10-CM | POA: Diagnosis not present

## 2016-03-30 DIAGNOSIS — Z8673 Personal history of transient ischemic attack (TIA), and cerebral infarction without residual deficits: Secondary | ICD-10-CM | POA: Diagnosis not present

## 2016-03-30 DIAGNOSIS — G4730 Sleep apnea, unspecified: Secondary | ICD-10-CM | POA: Diagnosis present

## 2016-03-30 DIAGNOSIS — Z91013 Allergy to seafood: Secondary | ICD-10-CM

## 2016-03-30 DIAGNOSIS — Z811 Family history of alcohol abuse and dependence: Secondary | ICD-10-CM

## 2016-03-30 DIAGNOSIS — M1710 Unilateral primary osteoarthritis, unspecified knee: Secondary | ICD-10-CM | POA: Diagnosis present

## 2016-03-30 DIAGNOSIS — K219 Gastro-esophageal reflux disease without esophagitis: Secondary | ICD-10-CM | POA: Diagnosis present

## 2016-03-30 DIAGNOSIS — Z4682 Encounter for fitting and adjustment of non-vascular catheter: Secondary | ICD-10-CM | POA: Diagnosis not present

## 2016-03-30 DIAGNOSIS — Z7982 Long term (current) use of aspirin: Secondary | ICD-10-CM

## 2016-03-30 DIAGNOSIS — R339 Retention of urine, unspecified: Secondary | ICD-10-CM | POA: Diagnosis not present

## 2016-03-30 DIAGNOSIS — Z794 Long term (current) use of insulin: Secondary | ICD-10-CM

## 2016-03-30 DIAGNOSIS — I2510 Atherosclerotic heart disease of native coronary artery without angina pectoris: Principal | ICD-10-CM | POA: Diagnosis present

## 2016-03-30 DIAGNOSIS — J939 Pneumothorax, unspecified: Secondary | ICD-10-CM | POA: Diagnosis present

## 2016-03-30 DIAGNOSIS — I4891 Unspecified atrial fibrillation: Secondary | ICD-10-CM | POA: Diagnosis present

## 2016-03-30 DIAGNOSIS — Z7984 Long term (current) use of oral hypoglycemic drugs: Secondary | ICD-10-CM

## 2016-03-30 DIAGNOSIS — I440 Atrioventricular block, first degree: Secondary | ICD-10-CM | POA: Diagnosis present

## 2016-03-30 DIAGNOSIS — I459 Conduction disorder, unspecified: Secondary | ICD-10-CM | POA: Diagnosis present

## 2016-03-30 DIAGNOSIS — E785 Hyperlipidemia, unspecified: Secondary | ICD-10-CM | POA: Diagnosis present

## 2016-03-30 DIAGNOSIS — Z8249 Family history of ischemic heart disease and other diseases of the circulatory system: Secondary | ICD-10-CM | POA: Diagnosis not present

## 2016-03-30 DIAGNOSIS — R0600 Dyspnea, unspecified: Secondary | ICD-10-CM

## 2016-03-30 DIAGNOSIS — I080 Rheumatic disorders of both mitral and aortic valves: Secondary | ICD-10-CM | POA: Diagnosis present

## 2016-03-30 DIAGNOSIS — Z880 Allergy status to penicillin: Secondary | ICD-10-CM

## 2016-03-30 DIAGNOSIS — I358 Other nonrheumatic aortic valve disorders: Secondary | ICD-10-CM | POA: Diagnosis not present

## 2016-03-30 DIAGNOSIS — I272 Pulmonary hypertension, unspecified: Secondary | ICD-10-CM | POA: Diagnosis present

## 2016-03-30 DIAGNOSIS — I110 Hypertensive heart disease with heart failure: Secondary | ICD-10-CM | POA: Diagnosis present

## 2016-03-30 DIAGNOSIS — R0602 Shortness of breath: Secondary | ICD-10-CM | POA: Diagnosis not present

## 2016-03-30 DIAGNOSIS — Z951 Presence of aortocoronary bypass graft: Secondary | ICD-10-CM

## 2016-03-30 DIAGNOSIS — Z952 Presence of prosthetic heart valve: Secondary | ICD-10-CM | POA: Diagnosis not present

## 2016-03-30 DIAGNOSIS — I351 Nonrheumatic aortic (valve) insufficiency: Secondary | ICD-10-CM | POA: Diagnosis not present

## 2016-03-30 DIAGNOSIS — I10 Essential (primary) hypertension: Secondary | ICD-10-CM | POA: Diagnosis not present

## 2016-03-30 DIAGNOSIS — I350 Nonrheumatic aortic (valve) stenosis: Secondary | ICD-10-CM | POA: Diagnosis not present

## 2016-03-30 DIAGNOSIS — I481 Persistent atrial fibrillation: Secondary | ICD-10-CM | POA: Diagnosis not present

## 2016-03-30 DIAGNOSIS — K5900 Constipation, unspecified: Secondary | ICD-10-CM | POA: Diagnosis not present

## 2016-03-30 HISTORY — PX: AORTIC VALVE REPLACEMENT: SHX41

## 2016-03-30 HISTORY — PX: CLIPPING OF ATRIAL APPENDAGE: SHX5773

## 2016-03-30 HISTORY — PX: TEE WITHOUT CARDIOVERSION: SHX5443

## 2016-03-30 HISTORY — PX: CORONARY ARTERY BYPASS GRAFT: SHX141

## 2016-03-30 LAB — POCT I-STAT, CHEM 8
BUN: 15 mg/dL (ref 6–20)
BUN: 13 mg/dL (ref 6–20)
BUN: 15 mg/dL (ref 6–20)
BUN: 13 mg/dL (ref 6–20)
BUN: 14 mg/dL (ref 6–20)
BUN: 14 mg/dL (ref 6–20)
BUN: 13 mg/dL (ref 6–20)
CALCIUM ION: 1.29 mmol/L (ref 1.15–1.40)
CALCIUM ION: 1.1 mmol/L — AB (ref 1.15–1.40)
CHLORIDE: 103 mmol/L (ref 101–111)
CHLORIDE: 104 mmol/L (ref 101–111)
CHLORIDE: 105 mmol/L (ref 101–111)
CHLORIDE: 103 mmol/L (ref 101–111)
CREATININE: 0.6 mg/dL — AB (ref 0.61–1.24)
CREATININE: 0.8 mg/dL (ref 0.61–1.24)
Calcium, Ion: 1.3 mmol/L (ref 1.15–1.40)
Calcium, Ion: 1.08 mmol/L — ABNORMAL LOW (ref 1.15–1.40)
Calcium, Ion: 1.08 mmol/L — ABNORMAL LOW (ref 1.15–1.40)
Calcium, Ion: 1.07 mmol/L — ABNORMAL LOW (ref 1.15–1.40)
Calcium, Ion: 1.11 mmol/L — ABNORMAL LOW (ref 1.15–1.40)
Chloride: 102 mmol/L (ref 101–111)
Chloride: 100 mmol/L — ABNORMAL LOW (ref 101–111)
Chloride: 106 mmol/L (ref 101–111)
Creatinine, Ser: 0.8 mg/dL (ref 0.61–1.24)
Creatinine, Ser: 0.7 mg/dL (ref 0.61–1.24)
Creatinine, Ser: 0.7 mg/dL (ref 0.61–1.24)
Creatinine, Ser: 0.8 mg/dL (ref 0.61–1.24)
Creatinine, Ser: 0.7 mg/dL (ref 0.61–1.24)
GLUCOSE: 183 mg/dL — AB (ref 65–99)
GLUCOSE: 150 mg/dL — AB (ref 65–99)
Glucose, Bld: 166 mg/dL — ABNORMAL HIGH (ref 65–99)
Glucose, Bld: 143 mg/dL — ABNORMAL HIGH (ref 65–99)
Glucose, Bld: 161 mg/dL — ABNORMAL HIGH (ref 65–99)
Glucose, Bld: 182 mg/dL — ABNORMAL HIGH (ref 65–99)
Glucose, Bld: 193 mg/dL — ABNORMAL HIGH (ref 65–99)
HCT: 28 % — ABNORMAL LOW (ref 39.0–52.0)
HCT: 25 % — ABNORMAL LOW (ref 39.0–52.0)
HEMATOCRIT: 27 % — AB (ref 39.0–52.0)
HEMATOCRIT: 23 % — AB (ref 39.0–52.0)
HEMATOCRIT: 24 % — AB (ref 39.0–52.0)
HEMATOCRIT: 24 % — AB (ref 39.0–52.0)
HEMATOCRIT: 24 % — AB (ref 39.0–52.0)
HEMOGLOBIN: 9.2 g/dL — AB (ref 13.0–17.0)
HEMOGLOBIN: 8.5 g/dL — AB (ref 13.0–17.0)
Hemoglobin: 7.8 g/dL — ABNORMAL LOW (ref 13.0–17.0)
Hemoglobin: 9.5 g/dL — ABNORMAL LOW (ref 13.0–17.0)
Hemoglobin: 8.2 g/dL — ABNORMAL LOW (ref 13.0–17.0)
Hemoglobin: 8.2 g/dL — ABNORMAL LOW (ref 13.0–17.0)
Hemoglobin: 8.2 g/dL — ABNORMAL LOW (ref 13.0–17.0)
POTASSIUM: 4.1 mmol/L (ref 3.5–5.1)
POTASSIUM: 4.6 mmol/L (ref 3.5–5.1)
POTASSIUM: 4.1 mmol/L (ref 3.5–5.1)
POTASSIUM: 4.3 mmol/L (ref 3.5–5.1)
POTASSIUM: 4.3 mmol/L (ref 3.5–5.1)
Potassium: 4.5 mmol/L (ref 3.5–5.1)
Potassium: 4.7 mmol/L (ref 3.5–5.1)
SODIUM: 135 mmol/L (ref 135–145)
SODIUM: 138 mmol/L (ref 135–145)
SODIUM: 138 mmol/L (ref 135–145)
Sodium: 139 mmol/L (ref 135–145)
Sodium: 139 mmol/L (ref 135–145)
Sodium: 139 mmol/L (ref 135–145)
Sodium: 138 mmol/L (ref 135–145)
TCO2: 27 mmol/L (ref 0–100)
TCO2: 28 mmol/L (ref 0–100)
TCO2: 29 mmol/L (ref 0–100)
TCO2: 24 mmol/L (ref 0–100)
TCO2: 22 mmol/L (ref 0–100)
TCO2: 24 mmol/L (ref 0–100)
TCO2: 23 mmol/L (ref 0–100)

## 2016-03-30 LAB — CBC
HCT: 27.2 % — ABNORMAL LOW (ref 39.0–52.0)
HEMATOCRIT: 25.8 % — AB (ref 39.0–52.0)
HEMOGLOBIN: 8.7 g/dL — AB (ref 13.0–17.0)
HEMOGLOBIN: 9.1 g/dL — AB (ref 13.0–17.0)
MCH: 28.5 pg (ref 26.0–34.0)
MCH: 28.1 pg (ref 26.0–34.0)
MCHC: 33.7 g/dL (ref 30.0–36.0)
MCHC: 33.5 g/dL (ref 30.0–36.0)
MCV: 84.6 fL (ref 78.0–100.0)
MCV: 84 fL (ref 78.0–100.0)
PLATELETS: 218 10*3/uL (ref 150–400)
Platelets: 180 10*3/uL (ref 150–400)
RBC: 3.05 MIL/uL — AB (ref 4.22–5.81)
RBC: 3.24 MIL/uL — ABNORMAL LOW (ref 4.22–5.81)
RDW: 13.3 % (ref 11.5–15.5)
RDW: 13.4 % (ref 11.5–15.5)
WBC: 13.3 10*3/uL — ABNORMAL HIGH (ref 4.0–10.5)
WBC: 9.8 10*3/uL (ref 4.0–10.5)

## 2016-03-30 LAB — POCT I-STAT 3, ART BLOOD GAS (G3+)
ACID-BASE DEFICIT: 3 mmol/L — AB (ref 0.0–2.0)
ACID-BASE DEFICIT: 3 mmol/L — AB (ref 0.0–2.0)
ACID-BASE DEFICIT: 3 mmol/L — AB (ref 0.0–2.0)
Acid-base deficit: 1 mmol/L (ref 0.0–2.0)
Acid-base deficit: 3 mmol/L — ABNORMAL HIGH (ref 0.0–2.0)
BICARBONATE: 24.7 mmol/L (ref 20.0–28.0)
BICARBONATE: 22.1 mmol/L (ref 20.0–28.0)
BICARBONATE: 21.8 mmol/L (ref 20.0–28.0)
Bicarbonate: 25.6 mmol/L (ref 20.0–28.0)
Bicarbonate: 23.3 mmol/L (ref 20.0–28.0)
O2 SAT: 100 %
O2 SAT: 98 %
O2 SAT: 99 %
O2 Saturation: 97 %
O2 Saturation: 99 %
PCO2 ART: 53.5 mmHg — AB (ref 32.0–48.0)
PCO2 ART: 48.6 mmHg — AB (ref 32.0–48.0)
PH ART: 7.288 — AB (ref 7.350–7.450)
PO2 ART: 303 mmHg — AB (ref 83.0–108.0)
PO2 ART: 115 mmHg — AB (ref 83.0–108.0)
PO2 ART: 122 mmHg — AB (ref 83.0–108.0)
Patient temperature: 37.4
Patient temperature: 37.7
TCO2: 27 mmol/L (ref 0–100)
TCO2: 26 mmol/L (ref 0–100)
TCO2: 25 mmol/L (ref 0–100)
TCO2: 23 mmol/L (ref 0–100)
TCO2: 23 mmol/L (ref 0–100)
pCO2 arterial: 59.3 mmHg — ABNORMAL HIGH (ref 32.0–48.0)
pCO2 arterial: 37.9 mmHg (ref 32.0–48.0)
pCO2 arterial: 36.4 mmHg (ref 32.0–48.0)
pH, Arterial: 7.23 — ABNORMAL LOW (ref 7.350–7.450)
pH, Arterial: 7.291 — ABNORMAL LOW (ref 7.350–7.450)
pH, Arterial: 7.377 (ref 7.350–7.450)
pH, Arterial: 7.388 (ref 7.350–7.450)
pO2, Arterial: 123 mmHg — ABNORMAL HIGH (ref 83.0–108.0)
pO2, Arterial: 126 mmHg — ABNORMAL HIGH (ref 83.0–108.0)

## 2016-03-30 LAB — BASIC METABOLIC PANEL
ANION GAP: 11 (ref 5–15)
BUN: 12 mg/dL (ref 6–20)
CHLORIDE: 105 mmol/L (ref 101–111)
CO2: 20 mmol/L — ABNORMAL LOW (ref 22–32)
Calcium: 7.8 mg/dL — ABNORMAL LOW (ref 8.9–10.3)
Creatinine, Ser: 0.91 mg/dL (ref 0.61–1.24)
Glucose, Bld: 149 mg/dL — ABNORMAL HIGH (ref 65–99)
POTASSIUM: 4.7 mmol/L (ref 3.5–5.1)
SODIUM: 136 mmol/L (ref 135–145)

## 2016-03-30 LAB — PLATELET COUNT: PLATELETS: 187 10*3/uL (ref 150–400)

## 2016-03-30 LAB — HEMOGLOBIN AND HEMATOCRIT, BLOOD
HEMATOCRIT: 23.1 % — AB (ref 39.0–52.0)
Hemoglobin: 7.8 g/dL — ABNORMAL LOW (ref 13.0–17.0)

## 2016-03-30 LAB — POCT I-STAT 4, (NA,K, GLUC, HGB,HCT)
Glucose, Bld: 130 mg/dL — ABNORMAL HIGH (ref 65–99)
HCT: 24 % — ABNORMAL LOW (ref 39.0–52.0)
HEMOGLOBIN: 8.2 g/dL — AB (ref 13.0–17.0)
Potassium: 4.4 mmol/L (ref 3.5–5.1)
SODIUM: 140 mmol/L (ref 135–145)

## 2016-03-30 LAB — GLUCOSE, CAPILLARY
GLUCOSE-CAPILLARY: 173 mg/dL — AB (ref 65–99)
GLUCOSE-CAPILLARY: 131 mg/dL — AB (ref 65–99)
GLUCOSE-CAPILLARY: 141 mg/dL — AB (ref 65–99)
GLUCOSE-CAPILLARY: 138 mg/dL — AB (ref 65–99)
GLUCOSE-CAPILLARY: 157 mg/dL — AB (ref 65–99)
Glucose-Capillary: 134 mg/dL — ABNORMAL HIGH (ref 65–99)
Glucose-Capillary: 135 mg/dL — ABNORMAL HIGH (ref 65–99)
Glucose-Capillary: 145 mg/dL — ABNORMAL HIGH (ref 65–99)
Glucose-Capillary: 183 mg/dL — ABNORMAL HIGH (ref 65–99)
Glucose-Capillary: 167 mg/dL — ABNORMAL HIGH (ref 65–99)

## 2016-03-30 LAB — PROTIME-INR
INR: 1.38
PROTHROMBIN TIME: 17 s — AB (ref 11.4–15.2)

## 2016-03-30 LAB — PREPARE RBC (CROSSMATCH)

## 2016-03-30 LAB — APTT: aPTT: 36 seconds (ref 24–36)

## 2016-03-30 LAB — MAGNESIUM: MAGNESIUM: 2.5 mg/dL — AB (ref 1.7–2.4)

## 2016-03-30 SURGERY — CORONARY ARTERY BYPASS GRAFTING (CABG)
Anesthesia: General

## 2016-03-30 MED ORDER — STERILE WATER FOR IRRIGATION IR SOLN
Status: DC | PRN
  Administered 2016-03-30: 1000 mL

## 2016-03-30 MED ORDER — DEXMEDETOMIDINE HCL IN NACL 200 MCG/50ML IV SOLN
INTRAVENOUS | Status: AC
  Filled 2016-03-30: qty 50

## 2016-03-30 MED ORDER — ATORVASTATIN CALCIUM 80 MG PO TABS
80.0000 mg | ORAL_TABLET | Freq: Every day | ORAL | Status: DC
  Filled 2016-03-30 (×6): qty 1

## 2016-03-30 MED ORDER — ROCURONIUM BROMIDE 50 MG/5ML IV SOSY
PREFILLED_SYRINGE | INTRAVENOUS | Status: AC
  Filled 2016-03-30: qty 5

## 2016-03-30 MED ORDER — LACTATED RINGERS IV SOLN
INTRAVENOUS | Status: DC
  Administered 2016-03-30: 15:00:00 via INTRAVENOUS

## 2016-03-30 MED ORDER — SODIUM CHLORIDE 0.9 % IV SOLN: INTRAVENOUS | Status: DC

## 2016-03-30 MED ORDER — LEVOFLOXACIN IN D5W 750 MG/150ML IV SOLN
750.0000 mg | INTRAVENOUS | Status: AC
  Administered 2016-03-31: 750 mg via INTRAVENOUS
  Filled 2016-03-30: qty 150

## 2016-03-30 MED ORDER — SODIUM CHLORIDE 0.9% FLUSH
3.0000 mL | Freq: Two times a day (BID) | INTRAVENOUS | Status: DC
  Administered 2016-03-31 – 2016-04-07 (×11): 3 mL via INTRAVENOUS

## 2016-03-30 MED ORDER — SUCCINYLCHOLINE CHLORIDE 200 MG/10ML IV SOSY
PREFILLED_SYRINGE | INTRAVENOUS | Status: AC
  Filled 2016-03-30: qty 10

## 2016-03-30 MED ORDER — MIDAZOLAM HCL 2 MG/2ML IJ SOLN: 2.0000 mg | INTRAMUSCULAR | Status: DC | PRN

## 2016-03-30 MED ORDER — FENTANYL CITRATE (PF) 250 MCG/5ML IJ SOLN
INTRAMUSCULAR | Status: AC
  Filled 2016-03-30: qty 5

## 2016-03-30 MED ORDER — PHENYLEPHRINE 40 MCG/ML (10ML) SYRINGE FOR IV PUSH (FOR BLOOD PRESSURE SUPPORT)
PREFILLED_SYRINGE | INTRAVENOUS | Status: AC
  Filled 2016-03-30: qty 20

## 2016-03-30 MED ORDER — ORAL CARE MOUTH RINSE
15.0000 mL | OROMUCOSAL | Status: DC
  Administered 2016-03-30: 15 mL via OROMUCOSAL

## 2016-03-30 MED ORDER — METOPROLOL TARTRATE 25 MG/10 ML ORAL SUSPENSION: 12.5000 mg | Freq: Two times a day (BID) | ORAL | Status: DC

## 2016-03-30 MED ORDER — MIDAZOLAM HCL 2 MG/2ML IJ SOLN
INTRAMUSCULAR | Status: AC
  Filled 2016-03-30: qty 2

## 2016-03-30 MED ORDER — VANCOMYCIN HCL IN DEXTROSE 1-5 GM/200ML-% IV SOLN
1000.0000 mg | Freq: Once | INTRAVENOUS | Status: AC
  Administered 2016-03-30: 1000 mg via INTRAVENOUS
  Filled 2016-03-30: qty 200

## 2016-03-30 MED ORDER — LIDOCAINE 2% (20 MG/ML) 5 ML SYRINGE
INTRAMUSCULAR | Status: AC
  Filled 2016-03-30: qty 5

## 2016-03-30 MED ORDER — ETOMIDATE 2 MG/ML IV SOLN
INTRAVENOUS | Status: AC
  Filled 2016-03-30: qty 10

## 2016-03-30 MED ORDER — INSULIN REGULAR BOLUS VIA INFUSION
0.0000 [IU] | Freq: Three times a day (TID) | INTRAVENOUS | Status: DC
  Filled 2016-03-30: qty 10

## 2016-03-30 MED ORDER — PANTOPRAZOLE SODIUM 40 MG PO TBEC
40.0000 mg | DELAYED_RELEASE_TABLET | Freq: Every day | ORAL | Status: DC
  Administered 2016-04-01 – 2016-04-10 (×10): 40 mg via ORAL
  Filled 2016-03-30 (×10): qty 1

## 2016-03-30 MED ORDER — SODIUM CHLORIDE 0.9% FLUSH: 3.0000 mL | INTRAVENOUS | Status: DC | PRN

## 2016-03-30 MED ORDER — ALBUMIN HUMAN 5 % IV SOLN
INTRAVENOUS | Status: DC | PRN
  Administered 2016-03-30 (×2): via INTRAVENOUS

## 2016-03-30 MED ORDER — HEPARIN SODIUM (PORCINE) 1000 UNIT/ML IJ SOLN
INTRAMUSCULAR | Status: AC
  Filled 2016-03-30: qty 1

## 2016-03-30 MED ORDER — BISACODYL 5 MG PO TBEC
10.0000 mg | DELAYED_RELEASE_TABLET | Freq: Every day | ORAL | Status: DC
  Administered 2016-03-31 – 2016-04-09 (×7): 10 mg via ORAL
  Filled 2016-03-30 (×7): qty 2

## 2016-03-30 MED ORDER — LACTATED RINGERS IV SOLN
INTRAVENOUS | Status: DC | PRN
  Administered 2016-03-30 (×3): via INTRAVENOUS

## 2016-03-30 MED ORDER — ROCURONIUM BROMIDE 10 MG/ML (PF) SYRINGE
PREFILLED_SYRINGE | INTRAVENOUS | Status: DC | PRN
  Administered 2016-03-30: 50 mg via INTRAVENOUS
  Administered 2016-03-30: 20 mg via INTRAVENOUS
  Administered 2016-03-30: 50 mg via INTRAVENOUS
  Administered 2016-03-30: 30 mg via INTRAVENOUS

## 2016-03-30 MED ORDER — SODIUM CHLORIDE 0.9 % IV SOLN: 250.0000 mL | INTRAVENOUS | Status: DC

## 2016-03-30 MED ORDER — HEMOSTATIC AGENTS (NO CHARGE) OPTIME
TOPICAL | Status: DC | PRN
  Administered 2016-03-30: 3 via TOPICAL
  Administered 2016-03-30: 1 via TOPICAL

## 2016-03-30 MED ORDER — EPHEDRINE 5 MG/ML INJ
INTRAVENOUS | Status: AC
  Filled 2016-03-30: qty 10

## 2016-03-30 MED ORDER — MORPHINE SULFATE (PF) 2 MG/ML IV SOLN: 1.0000 mg | INTRAVENOUS | Status: AC | PRN

## 2016-03-30 MED ORDER — METOPROLOL TARTRATE 12.5 MG HALF TABLET: 12.5000 mg | ORAL_TABLET | Freq: Two times a day (BID) | ORAL | Status: DC

## 2016-03-30 MED ORDER — ALBUTEROL SULFATE (2.5 MG/3ML) 0.083% IN NEBU
2.5000 mg | INHALATION_SOLUTION | Freq: Once | RESPIRATORY_TRACT | Status: AC
  Administered 2016-03-30: 2.5 mg via RESPIRATORY_TRACT
  Filled 2016-03-30: qty 3

## 2016-03-30 MED ORDER — GABAPENTIN 100 MG PO CAPS
100.0000 mg | ORAL_CAPSULE | Freq: Every day | ORAL | Status: DC
  Administered 2016-03-31 – 2016-04-10 (×11): 100 mg via ORAL
  Filled 2016-03-30 (×11): qty 1

## 2016-03-30 MED ORDER — ARTIFICIAL TEARS OP OINT
TOPICAL_OINTMENT | OPHTHALMIC | Status: AC
  Filled 2016-03-30: qty 3.5

## 2016-03-30 MED ORDER — METOPROLOL TARTRATE 5 MG/5ML IV SOLN
2.5000 mg | INTRAVENOUS | Status: DC | PRN
Start: 2016-03-30 — End: 2016-04-10
  Administered 2016-03-30: 2.5 mg via INTRAVENOUS
  Administered 2016-04-01: 5 mg via INTRAVENOUS
  Filled 2016-03-30 (×2): qty 5

## 2016-03-30 MED ORDER — ACETAMINOPHEN 650 MG RE SUPP
650.0000 mg | Freq: Once | RECTAL | Status: AC
  Administered 2016-03-30: 650 mg via RECTAL

## 2016-03-30 MED ORDER — ACETAMINOPHEN 160 MG/5ML PO SOLN: 650.0000 mg | Freq: Once | ORAL | Status: AC

## 2016-03-30 MED ORDER — FAMOTIDINE IN NACL 20-0.9 MG/50ML-% IV SOLN
20.0000 mg | Freq: Two times a day (BID) | INTRAVENOUS | Status: AC
  Administered 2016-03-30: 20 mg via INTRAVENOUS

## 2016-03-30 MED ORDER — NITROGLYCERIN IN D5W 200-5 MCG/ML-% IV SOLN
0.0000 ug/min | INTRAVENOUS | Status: DC
  Administered 2016-03-31: 40 ug/min via INTRAVENOUS
  Filled 2016-03-30: qty 250

## 2016-03-30 MED ORDER — ASPIRIN EC 325 MG PO TBEC
325.0000 mg | DELAYED_RELEASE_TABLET | Freq: Every day | ORAL | Status: DC
  Administered 2016-03-31 – 2016-04-10 (×11): 325 mg via ORAL
  Filled 2016-03-30 (×11): qty 1

## 2016-03-30 MED ORDER — FENTANYL CITRATE (PF) 250 MCG/5ML IJ SOLN
INTRAMUSCULAR | Status: AC
  Filled 2016-03-30: qty 30

## 2016-03-30 MED ORDER — SODIUM CHLORIDE 0.9 % IV SOLN
0.0000 ug/min | INTRAVENOUS | Status: DC
  Filled 2016-03-30: qty 2

## 2016-03-30 MED ORDER — BISACODYL 10 MG RE SUPP: 10.0000 mg | Freq: Every day | RECTAL | Status: DC

## 2016-03-30 MED ORDER — ACETAMINOPHEN 500 MG PO TABS
1000.0000 mg | ORAL_TABLET | Freq: Four times a day (QID) | ORAL | Status: AC
  Administered 2016-03-30 – 2016-04-04 (×16): 1000 mg via ORAL
  Filled 2016-03-30 (×16): qty 2

## 2016-03-30 MED ORDER — ONDANSETRON HCL 4 MG/2ML IJ SOLN
4.0000 mg | Freq: Four times a day (QID) | INTRAMUSCULAR | Status: DC | PRN
  Administered 2016-03-31 – 2016-04-01 (×4): 4 mg via INTRAVENOUS
  Filled 2016-03-30 (×5): qty 2

## 2016-03-30 MED ORDER — MORPHINE SULFATE (PF) 2 MG/ML IV SOLN
2.0000 mg | INTRAVENOUS | Status: DC | PRN
  Administered 2016-03-30 – 2016-03-31 (×7): 2 mg via INTRAVENOUS
  Administered 2016-03-31: 4 mg via INTRAVENOUS
  Administered 2016-03-31: 2 mg via INTRAVENOUS
  Administered 2016-04-01: 4 mg via INTRAVENOUS
  Administered 2016-04-01: 2 mg via INTRAVENOUS
  Filled 2016-03-30: qty 2
  Filled 2016-03-30 (×2): qty 1
  Filled 2016-03-30: qty 2
  Filled 2016-03-30: qty 1
  Filled 2016-03-30: qty 2
  Filled 2016-03-30 (×2): qty 1
  Filled 2016-03-30: qty 2

## 2016-03-30 MED ORDER — SODIUM CHLORIDE 0.45 % IV SOLN
INTRAVENOUS | Status: DC | PRN
  Administered 2016-03-30: 14:00:00 via INTRAVENOUS

## 2016-03-30 MED ORDER — ORAL CARE MOUTH RINSE
15.0000 mL | Freq: Two times a day (BID) | OROMUCOSAL | Status: DC
  Administered 2016-04-02 – 2016-04-07 (×8): 15 mL via OROMUCOSAL

## 2016-03-30 MED ORDER — MIDAZOLAM HCL 10 MG/2ML IJ SOLN
INTRAMUSCULAR | Status: AC
  Filled 2016-03-30: qty 2

## 2016-03-30 MED ORDER — ROCURONIUM BROMIDE 50 MG/5ML IV SOSY
PREFILLED_SYRINGE | INTRAVENOUS | Status: AC
  Filled 2016-03-30: qty 15

## 2016-03-30 MED ORDER — ASPIRIN 81 MG PO CHEW: 324.0000 mg | CHEWABLE_TABLET | Freq: Every day | ORAL | Status: DC

## 2016-03-30 MED ORDER — CHLORHEXIDINE GLUCONATE 0.12 % MT SOLN
15.0000 mL | OROMUCOSAL | Status: AC
  Administered 2016-03-30: 15 mL via OROMUCOSAL

## 2016-03-30 MED ORDER — FENTANYL CITRATE (PF) 250 MCG/5ML IJ SOLN
INTRAMUSCULAR | Status: DC | PRN
  Administered 2016-03-30: 250 ug via INTRAVENOUS
  Administered 2016-03-30: 100 ug via INTRAVENOUS
  Administered 2016-03-30: 250 ug via INTRAVENOUS
  Administered 2016-03-30: 100 ug via INTRAVENOUS
  Administered 2016-03-30 (×2): 250 ug via INTRAVENOUS
  Administered 2016-03-30: 100 ug via INTRAVENOUS
  Administered 2016-03-30 (×3): 50 ug via INTRAVENOUS
  Administered 2016-03-30: 100 ug via INTRAVENOUS
  Administered 2016-03-30: 200 ug via INTRAVENOUS

## 2016-03-30 MED ORDER — PROPOFOL 10 MG/ML IV BOLUS
INTRAVENOUS | Status: AC
  Filled 2016-03-30: qty 20

## 2016-03-30 MED ORDER — PROTAMINE SULFATE 10 MG/ML IV SOLN
INTRAVENOUS | Status: AC
  Filled 2016-03-30: qty 25

## 2016-03-30 MED ORDER — ACETAMINOPHEN 160 MG/5ML PO SOLN: 1000.0000 mg | Freq: Four times a day (QID) | ORAL | Status: DC

## 2016-03-30 MED ORDER — LACTATED RINGERS IV SOLN: INTRAVENOUS | Status: DC

## 2016-03-30 MED ORDER — PHENYLEPHRINE 40 MCG/ML (10ML) SYRINGE FOR IV PUSH (FOR BLOOD PRESSURE SUPPORT)
PREFILLED_SYRINGE | INTRAVENOUS | Status: AC
  Filled 2016-03-30: qty 10

## 2016-03-30 MED ORDER — SODIUM CHLORIDE 0.9 % IV SOLN
30.0000 meq | Freq: Once | INTRAVENOUS | Status: DC
  Filled 2016-03-30: qty 15

## 2016-03-30 MED ORDER — PROTAMINE SULFATE 10 MG/ML IV SOLN
INTRAVENOUS | Status: AC
  Filled 2016-03-30: qty 10

## 2016-03-30 MED ORDER — 0.9 % SODIUM CHLORIDE (POUR BTL) OPTIME
TOPICAL | Status: DC | PRN
  Administered 2016-03-30: 5000 mL

## 2016-03-30 MED ORDER — ETOMIDATE 2 MG/ML IV SOLN
INTRAVENOUS | Status: DC | PRN
  Administered 2016-03-30: 18 mg via INTRAVENOUS

## 2016-03-30 MED ORDER — HEPARIN SODIUM (PORCINE) 1000 UNIT/ML IJ SOLN
INTRAMUSCULAR | Status: DC | PRN
  Administered 2016-03-30: 28000 [IU] via INTRAVENOUS
  Administered 2016-03-30: 2000 [IU] via INTRAVENOUS

## 2016-03-30 MED ORDER — PROTAMINE SULFATE 10 MG/ML IV SOLN
INTRAVENOUS | Status: DC | PRN
  Administered 2016-03-30: 350 mg via INTRAVENOUS

## 2016-03-30 MED ORDER — DOCUSATE SODIUM 100 MG PO CAPS
200.0000 mg | ORAL_CAPSULE | Freq: Every day | ORAL | Status: DC
  Administered 2016-03-31 – 2016-04-09 (×8): 200 mg via ORAL
  Filled 2016-03-30 (×8): qty 2

## 2016-03-30 MED ORDER — DEXMEDETOMIDINE HCL IN NACL 200 MCG/50ML IV SOLN
0.0000 ug/kg/h | INTRAVENOUS | Status: DC
Start: 2016-03-30 — End: 2016-04-01
  Filled 2016-03-30: qty 50

## 2016-03-30 MED ORDER — SODIUM CHLORIDE 0.9 % IV SOLN
INTRAVENOUS | Status: DC
  Administered 2016-03-30: 17:00:00 via INTRAVENOUS
  Filled 2016-03-30: qty 2.5

## 2016-03-30 MED ORDER — OXYCODONE HCL 5 MG PO TABS
5.0000 mg | ORAL_TABLET | ORAL | Status: DC | PRN
  Administered 2016-03-30: 5 mg via ORAL
  Administered 2016-03-31: 10 mg via ORAL
  Administered 2016-03-31: 5 mg via ORAL
  Administered 2016-03-31 – 2016-04-02 (×8): 10 mg via ORAL
  Administered 2016-04-03 (×2): 5 mg via ORAL
  Administered 2016-04-04: 10 mg via ORAL
  Administered 2016-04-04: 5 mg via ORAL
  Administered 2016-04-05 – 2016-04-10 (×13): 10 mg via ORAL
  Filled 2016-03-30: qty 1
  Filled 2016-03-30 (×7): qty 2
  Filled 2016-03-30: qty 1
  Filled 2016-03-30 (×4): qty 2
  Filled 2016-03-30: qty 1
  Filled 2016-03-30 (×2): qty 2
  Filled 2016-03-30: qty 1
  Filled 2016-03-30 (×7): qty 2
  Filled 2016-03-30: qty 1
  Filled 2016-03-30 (×5): qty 2

## 2016-03-30 MED ORDER — MAGNESIUM SULFATE 4 GM/100ML IV SOLN
4.0000 g | Freq: Once | INTRAVENOUS | Status: AC
  Administered 2016-03-30: 4 g via INTRAVENOUS
  Filled 2016-03-30: qty 100

## 2016-03-30 MED ORDER — ALBUMIN HUMAN 5 % IV SOLN
250.0000 mL | INTRAVENOUS | Status: AC | PRN
Start: 2016-03-30 — End: 2016-03-31
  Administered 2016-03-30: 250 mL via INTRAVENOUS

## 2016-03-30 MED ORDER — TRAMADOL HCL 50 MG PO TABS
50.0000 mg | ORAL_TABLET | ORAL | Status: DC | PRN
  Administered 2016-03-31 – 2016-04-08 (×9): 100 mg via ORAL
  Filled 2016-03-30 (×9): qty 2

## 2016-03-30 MED ORDER — LACTATED RINGERS IV SOLN: 500.0000 mL | Freq: Once | INTRAVENOUS | Status: DC | PRN

## 2016-03-30 MED ORDER — CHLORHEXIDINE GLUCONATE 0.12% ORAL RINSE (MEDLINE KIT)
15.0000 mL | Freq: Two times a day (BID) | OROMUCOSAL | Status: DC
  Administered 2016-03-30 – 2016-04-07 (×7): 15 mL via OROMUCOSAL

## 2016-03-30 MED ORDER — MIDAZOLAM HCL 5 MG/5ML IJ SOLN
INTRAMUSCULAR | Status: DC | PRN
  Administered 2016-03-30: 1 mg via INTRAVENOUS
  Administered 2016-03-30 (×3): 2 mg via INTRAVENOUS
  Administered 2016-03-30: 3 mg via INTRAVENOUS
  Administered 2016-03-30: 2 mg via INTRAVENOUS

## 2016-03-30 SURGICAL SUPPLY — 119 items
ADAPTER CARDIO PERF ANTE/RETRO (ADAPTER) ×5 IMPLANT
ADH SRG 12 PREFL SYR 3 SPRDR (MISCELLANEOUS)
ADPR PRFSN 84XANTGRD RTRGD (ADAPTER) ×2
APPLICATOR COTTON TIP 6IN STRL (MISCELLANEOUS) IMPLANT
BAG DECANTER FOR FLEXI CONT (MISCELLANEOUS) ×3 IMPLANT
BANDAGE ACE 4X5 VEL STRL LF (GAUZE/BANDAGES/DRESSINGS) ×3 IMPLANT
BANDAGE ACE 6X5 VEL STRL LF (GAUZE/BANDAGES/DRESSINGS) ×3 IMPLANT
BANDAGE ELASTIC 4 VELCRO ST LF (GAUZE/BANDAGES/DRESSINGS) ×2 IMPLANT
BANDAGE ELASTIC 6 VELCRO ST LF (GAUZE/BANDAGES/DRESSINGS) ×2 IMPLANT
BASKET HEART  (ORDER IN 25'S) (MISCELLANEOUS) ×1
BASKET HEART (ORDER IN 25'S) (MISCELLANEOUS) ×1
BASKET HEART (ORDER IN 25S) (MISCELLANEOUS) ×1 IMPLANT
BLADE STERNUM SYSTEM 6 (BLADE) ×3 IMPLANT
BLADE SURG 15 STRL LF DISP TIS (BLADE) ×1 IMPLANT
BLADE SURG 15 STRL SS (BLADE) ×3
BNDG GAUZE ELAST 4 BULKY (GAUZE/BANDAGES/DRESSINGS) ×3 IMPLANT
CANISTER SUCTION 2500CC (MISCELLANEOUS) ×3 IMPLANT
CANNULA EZ GLIDE AORTIC 21FR (CANNULA) ×3 IMPLANT
CANNULA GUNDRY RCSP 15FR (MISCELLANEOUS) ×5 IMPLANT
CATH CPB KIT HENDRICKSON (MISCELLANEOUS) ×3 IMPLANT
CATH HEART VENT LEFT (CATHETERS) ×1 IMPLANT
CATH ROBINSON RED A/P 18FR (CATHETERS) ×6 IMPLANT
CATH THORACIC 36FR (CATHETERS) ×3 IMPLANT
CATH THORACIC 36FR RT ANG (CATHETERS) ×6 IMPLANT
CLIP FOGARTY SPRING 6M (CLIP) IMPLANT
CLIP TI MEDIUM 24 (CLIP) IMPLANT
CLIP TI WIDE RED SMALL 24 (CLIP) IMPLANT
CRADLE DONUT ADULT HEAD (MISCELLANEOUS) ×3 IMPLANT
DRAPE CARDIOVASCULAR INCISE (DRAPES) ×3
DRAPE SLUSH/WARMER DISC (DRAPES) ×3 IMPLANT
DRAPE SRG 135X102X78XABS (DRAPES) ×1 IMPLANT
DRSG AQUACEL AG ADV 3.5X14 (GAUZE/BANDAGES/DRESSINGS) ×2 IMPLANT
DRSG COVADERM 4X14 (GAUZE/BANDAGES/DRESSINGS) ×3 IMPLANT
ELECT REM PT RETURN 9FT ADLT (ELECTROSURGICAL) ×6
ELECTRODE REM PT RTRN 9FT ADLT (ELECTROSURGICAL) ×2 IMPLANT
FELT TEFLON 1X6 (MISCELLANEOUS) ×6 IMPLANT
GAUZE SPONGE 4X4 12PLY STRL (GAUZE/BANDAGES/DRESSINGS) ×6 IMPLANT
GLOVE BIO SURGEON STRL SZ 6.5 (GLOVE) ×1 IMPLANT
GLOVE BIO SURGEONS STRL SZ 6.5 (GLOVE) ×1
GLOVE BIOGEL PI IND STRL 6 (GLOVE) IMPLANT
GLOVE BIOGEL PI IND STRL 7.0 (GLOVE) IMPLANT
GLOVE BIOGEL PI INDICATOR 6 (GLOVE) ×6
GLOVE BIOGEL PI INDICATOR 7.0 (GLOVE) ×2
GLOVE SURG SIGNA 7.5 PF LTX (GLOVE) ×9 IMPLANT
GLOVE SURG SS PI 6.0 STRL IVOR (GLOVE) ×6 IMPLANT
GOWN STRL REUS W/ TWL LRG LVL3 (GOWN DISPOSABLE) ×4 IMPLANT
GOWN STRL REUS W/ TWL XL LVL3 (GOWN DISPOSABLE) ×2 IMPLANT
GOWN STRL REUS W/TWL LRG LVL3 (GOWN DISPOSABLE) ×12
GOWN STRL REUS W/TWL XL LVL3 (GOWN DISPOSABLE) ×6
HEMOSTAT POWDER SURGIFOAM 1G (HEMOSTASIS) ×9 IMPLANT
HEMOSTAT SURGICEL 2X14 (HEMOSTASIS) ×3 IMPLANT
INSERT FOGARTY XLG (MISCELLANEOUS) IMPLANT
KIT BASIN OR (CUSTOM PROCEDURE TRAY) ×3 IMPLANT
KIT ROOM TURNOVER OR (KITS) ×3 IMPLANT
KIT SUCTION CATH 14FR (SUCTIONS) ×6 IMPLANT
KIT VASOVIEW HEMOPRO VH 3000 (KITS) ×3 IMPLANT
LINE VENT (MISCELLANEOUS) ×2 IMPLANT
MARKER GRAFT CORONARY BYPASS (MISCELLANEOUS) ×9 IMPLANT
NDL AORTIC AIR ASPIRATING (NEEDLE) IMPLANT
NEEDLE AORTIC AIR ASPIRATING (NEEDLE) ×3 IMPLANT
NS IRRIG 1000ML POUR BTL (IV SOLUTION) ×15 IMPLANT
PACK OPEN HEART (CUSTOM PROCEDURE TRAY) ×3 IMPLANT
PAD ARMBOARD 7.5X6 YLW CONV (MISCELLANEOUS) ×6 IMPLANT
PAD CARDIAC INSULATION (MISCELLANEOUS) ×2 IMPLANT
PAD ELECT DEFIB RADIOL ZOLL (MISCELLANEOUS) ×3 IMPLANT
PENCIL BUTTON HOLSTER BLD 10FT (ELECTRODE) ×3 IMPLANT
PUNCH AORTIC ROTATE 4.0MM (MISCELLANEOUS) IMPLANT
PUNCH AORTIC ROTATE 4.5MM 8IN (MISCELLANEOUS) ×2 IMPLANT
PUNCH AORTIC ROTATE 5MM 8IN (MISCELLANEOUS) IMPLANT
SENSOR MYOCARDIAL TEMP (MISCELLANEOUS) ×2 IMPLANT
SET CARDIOPLEGIA MPS 5001102 (MISCELLANEOUS) ×2 IMPLANT
SPONGE GAUZE 4X4 12PLY STER LF (GAUZE/BANDAGES/DRESSINGS) ×4 IMPLANT
SUT BONE WAX W31G (SUTURE) ×3 IMPLANT
SUT ETHIBON 2 0 V 52N 30 (SUTURE) ×10 IMPLANT
SUT ETHIBON EXCEL 2-0 V-5 (SUTURE) IMPLANT
SUT ETHIBOND 2 0 SH (SUTURE) ×3
SUT ETHIBOND 2 0 SH 36X2 (SUTURE) ×1 IMPLANT
SUT ETHIBOND 2 0 V4 (SUTURE) IMPLANT
SUT ETHIBOND 2 0V4 GREEN (SUTURE) IMPLANT
SUT ETHIBOND 4 0 RB 1 (SUTURE) IMPLANT
SUT ETHIBOND V-5 VALVE (SUTURE) IMPLANT
SUT MNCRL AB 4-0 PS2 18 (SUTURE) ×2 IMPLANT
SUT PROLENE 3 0 SH 1 (SUTURE) IMPLANT
SUT PROLENE 3 0 SH DA (SUTURE) ×3 IMPLANT
SUT PROLENE 4 0 RB 1 (SUTURE) ×24
SUT PROLENE 4 0 SH DA (SUTURE) IMPLANT
SUT PROLENE 4-0 RB1 .5 CRCL 36 (SUTURE) ×2 IMPLANT
SUT PROLENE 6 0 C 1 30 (SUTURE) ×6 IMPLANT
SUT PROLENE 7 0 BV 1 (SUTURE) ×2 IMPLANT
SUT PROLENE 7 0 BV1 MDA (SUTURE) ×3 IMPLANT
SUT PROLENE 8 0 BV175 6 (SUTURE) IMPLANT
SUT SILK  1 MH (SUTURE) ×2
SUT SILK 1 MH (SUTURE) ×1 IMPLANT
SUT STEEL 6MS V (SUTURE) ×3 IMPLANT
SUT STEEL STERNAL CCS#1 18IN (SUTURE) IMPLANT
SUT STEEL SZ 6 DBL 3X14 BALL (SUTURE) ×3 IMPLANT
SUT VIC AB 1 CTX 36 (SUTURE) ×6
SUT VIC AB 1 CTX36XBRD ANBCTR (SUTURE) ×2 IMPLANT
SUT VIC AB 2-0 CT1 27 (SUTURE) ×3
SUT VIC AB 2-0 CT1 TAPERPNT 27 (SUTURE) IMPLANT
SUT VIC AB 2-0 CTX 27 (SUTURE) IMPLANT
SUT VIC AB 3-0 SH 27 (SUTURE)
SUT VIC AB 3-0 SH 27X BRD (SUTURE) IMPLANT
SUT VIC AB 3-0 X1 27 (SUTURE) IMPLANT
SUT VICRYL 4-0 PS2 18IN ABS (SUTURE) IMPLANT
SUTURE E-PAK OPEN HEART (SUTURE) ×3 IMPLANT
SYR 10ML KIT SKIN ADHESIVE (MISCELLANEOUS) IMPLANT
SYS ATRICLIP LAA EXCLUSION 40 (Clip) ×2 IMPLANT
SYSTEM SAHARA CHEST DRAIN ATS (WOUND CARE) ×3 IMPLANT
TAPE CLOTH SURG 4X10 WHT LF (GAUZE/BANDAGES/DRESSINGS) ×4 IMPLANT
TOWEL OR 17X24 6PK STRL BLUE (TOWEL DISPOSABLE) ×6 IMPLANT
TOWEL OR 17X26 10 PK STRL BLUE (TOWEL DISPOSABLE) ×6 IMPLANT
TRAY FOLEY IC TEMP SENS 16FR (CATHETERS) ×3 IMPLANT
TUBE FEEDING 8FR 16IN STR KANG (MISCELLANEOUS) ×3 IMPLANT
TUBING INSUFFLATION (TUBING) ×3 IMPLANT
UNDERPAD 30X30 (UNDERPADS AND DIAPERS) ×3 IMPLANT
VALVE MAGNA EASE AORTIC 23MM (Prosthesis & Implant Heart) ×2 IMPLANT
VENT LEFT HEART 12002 (CATHETERS) ×6
WATER STERILE IRR 1000ML POUR (IV SOLUTION) ×6 IMPLANT

## 2016-03-30 NOTE — Transfer of Care (Signed)
Immediate Anesthesia Transfer of Care Note  Patient: Mark Cabrera  Procedure(s) Performed: Procedure(s): CORONARY ARTERY BYPASS GRAFTING (CABG) Times Two (N/A) AORTIC VALVE REPLACEMENT (AVR) (N/A) TRANSESOPHAGEAL ECHOCARDIOGRAM (TEE) (N/A) CLIPPING OF LEFT ATRIAL APPENDAGE (N/A)  Patient Location: SICU  Anesthesia Type:General  Level of Consciousness: sedated and Patient remains intubated per anesthesia plan  Airway & Oxygen Therapy: Patient remains intubated per anesthesia plan and Patient placed on Ventilator (see vital sign flow sheet for setting)  Post-op Assessment: Post -op Vital signs reviewed and stable  Post vital signs: Reviewed and stable  Last Vitals:  BP 110/58 PAP 44/21 HR 90 (paced) RR 12 (see vent flowsheet) SpO2 98% on 0.5 FiO2 T 0000000   Complications: No apparent anesthesia complications

## 2016-03-30 NOTE — Anesthesia Procedure Notes (Signed)
Procedure Name: Intubation Date/Time: 03/30/2016 7:55 AM Performed by: Willeen Cass P Pre-anesthesia Checklist: Patient identified, Emergency Drugs available, Suction available and Patient being monitored Patient Re-evaluated:Patient Re-evaluated prior to inductionOxygen Delivery Method: Circle System Utilized Preoxygenation: Pre-oxygenation with 100% oxygen Intubation Type: IV induction Ventilation: Mask ventilation without difficulty and Oral airway inserted - appropriate to patient size Laryngoscope Size: Mac and 4 Grade View: Grade I Tube type: Oral Tube size: 7.5 mm Number of attempts: 1 Airway Equipment and Method: Stylet and Oral airway Placement Confirmation: ETT inserted through vocal cords under direct vision,  positive ETCO2 and breath sounds checked- equal and bilateral Secured at: 23 cm Tube secured with: Tape Dental Injury: Teeth and Oropharynx as per pre-operative assessment

## 2016-03-30 NOTE — Progress Notes (Signed)
  Echocardiogram Echocardiogram Transesophageal has been performed.  Mark Cabrera 03/30/2016, 9:01 AM

## 2016-03-30 NOTE — Anesthesia Postprocedure Evaluation (Signed)
Anesthesia Post Note  Patient: BOWDY PERETTI  Procedure(s) Performed: Procedure(s) (LRB): CORONARY ARTERY BYPASS GRAFTING (CABG) Times Two (N/A) AORTIC VALVE REPLACEMENT (AVR) (N/A) TRANSESOPHAGEAL ECHOCARDIOGRAM (TEE) (N/A) CLIPPING OF LEFT ATRIAL APPENDAGE (N/A)  Patient location during evaluation: SICU Anesthesia Type: General Pain management: pain level controlled Vital Signs Assessment: post-procedure vital signs reviewed and stable Respiratory status: patient remains intubated per anesthesia plan Cardiovascular status: stable Anesthetic complications: no       Last Vitals:  Vitals:   03/30/16 1830 03/30/16 1845  BP:    Pulse: 93 92  Resp: 16 16  Temp: 37.7 C 37.7 C    Last Pain:  Vitals:   03/30/16 1845  TempSrc: Core (Comment)                 Maimouna Rondeau

## 2016-03-30 NOTE — Interval H&P Note (Signed)
History and Physical Interval Note:  03/30/2016 7:28 AM  Mark Cabrera  has presented today for surgery, with the diagnosis of SEVERE AS CAD  The various methods of treatment have been discussed with the patient and family. After consideration of risks, benefits and other options for treatment, the patient has consented to  Procedure(s): CORONARY ARTERY BYPASS GRAFTING (CABG) (N/A) AORTIC VALVE REPLACEMENT (AVR) (N/A) TRANSESOPHAGEAL ECHOCARDIOGRAM (TEE) (N/A) as a surgical intervention .  The patient's history has been reviewed, patient examined, no change in status, stable for surgery.  I have reviewed the patient's chart and labs.  Questions were answered to the patient's satisfaction.     [name]

## 2016-03-30 NOTE — Brief Op Note (Addendum)
03/30/2016  12:31 PM  PATIENT:  Mark Cabrera  66 y.o. male  PRE-OPERATIVE DIAGNOSIS:  SEVERE Aortic Valve Stenosis Coronary Artery Disease  POST-OPERATIVE DIAGNOSIS:  SEVERE Aortic Valve Stenosis Coronary Artery Disease  PROCEDURE:  Procedure(s): CORONARY ARTERY BYPASS GRAFTING (CABG) (N/A) AORTIC VALVE REPLACEMENT (AVR) (N/A) TRANSESOPHAGEAL ECHOCARDIOGRAM (TEE) (N/A) CLIPPING OF ATRIAL APPENDAGE (N/A)  SURGEON:  Surgeon(s) and Role:    * [name], MD - Primary  PHYSICIAN ASSISTANT:  Nicholes Rough, PA-C  ANESTHESIA:   general  EBL:  Total I/O In: 1000 [I.V.:1000] Out: 100 [Urine:100]  BLOOD ADMINISTERED:none  DRAINS: ROUTINE   LOCAL MEDICATIONS USED:  NONE  SPECIMEN:  Source of Specimen:  aortic valve leaflets  DISPOSITION OF SPECIMEN:  PATHOLOGY  COUNTS:  YES  PLAN OF CARE: Admit to inpatient   PATIENT DISPOSITION:  ICU - intubated and hemodynamically stable.   Delay start of Pharmacological VTE agent (>24hrs) due to surgical blood loss or risk of bleeding: yes  XC= 126 min CPB= 172 min AV tricuspid calcific stenotic, severe anular calcification Vein and targets good quality

## 2016-03-30 NOTE — Procedures (Signed)
Extubation Procedure Note  Patient Details:   Name: Mark Cabrera DOB: 07-04-1950 MRN: [id number]   Airway Documentation:     Evaluation  O2 sats: stable throughout Complications: No apparent complications Patient did tolerate procedure well. Bilateral Breath Sounds: Other (Comment) (coarse)   Yes   Positive cuff leak noted, VC 570, NIF - 30.  Pt placed on nasal cannula 4 Lpm with humidity, no stridor noted, pt able to get 325 using IS. Needs more coaching due to pain.  RN aware.  Bayard Beaver 03/30/2016, 6:43 PM

## 2016-03-30 NOTE — OR Nursing (Signed)
Second call to unit

## 2016-03-30 NOTE — Anesthesia Preprocedure Evaluation (Signed)
Anesthesia Evaluation  Patient identified by MRN, date of birth, ID band Patient awake    Reviewed: Allergy & Precautions, NPO status , reviewed documented beta blocker date and time   Airway Mallampati: II  TM Distance: >3 FB     Dental   Pulmonary shortness of breath, sleep apnea ,    breath sounds clear to auscultation       Cardiovascular hypertension, + CAD, + Peripheral Vascular Disease, +CHF and + Orthopnea  + Valvular Problems/Murmurs  Rhythm:Regular Rate:Normal     Neuro/Psych  Neuromuscular disease    GI/Hepatic Neg liver ROS, GERD  ,  Endo/Other  diabetes  Renal/GU Renal disease     Musculoskeletal   Abdominal   Peds  Hematology   Anesthesia Other Findings   Reproductive/Obstetrics                             Anesthesia Physical Anesthesia Plan  ASA: III  Anesthesia Plan: General   Post-op Pain Management:    Induction: Intravenous  Airway Management Planned:   Additional Equipment:   Intra-op Plan:   Post-operative Plan: Post-operative intubation/ventilation  Informed Consent:   Dental advisory given  Plan Discussed with: CRNA and Anesthesiologist  Anesthesia Plan Comments:         Anesthesia Quick Evaluation

## 2016-03-30 NOTE — Progress Notes (Signed)
      [address]       [place] [address]             [phone]      S/p AVR CABG x 2  Extubated Some incisional pain  BP 111/66   Pulse 96   Temp 99.9 F (37.7 C)   Resp (!) 22   Ht 5\' 8"  (1.727 m)   Wt 220 lb 1.6 oz (99.8 kg)   SpO2 99%   BMI 33.47 kg/m   100% sat on 4 L [place]  43/29 CI= 3.2   Intake/Output Summary (Last 24 hours) at 03/30/16 1945 Last data filed at 03/30/16 1930  Gross per 24 hour  Intake          4517.44 ml  Output             4680 ml  Net          -162.56 ml   Doing well early postop  Uses CPAP at home- will resume  Remo Lipps C. Roxan Hockey, MD [facility] [phone]

## 2016-03-30 NOTE — OR Nursing (Signed)
First call to Unit

## 2016-03-30 NOTE — H&P (View-Only) (Signed)
Reason for Consult:AS/ CAD Referring Physician: Dr. Lamar Benes Mark Cabrera is an 66 y.o. male.  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 He presents with a 6 month history of dyspnea with exertion. Over the past several weeks it has become more frequent and severe. It has gotten to the point he would be SOB with walking across a room and he would gasp for breath while talking. HE recently has been unable to lie on his back due to dyspnea.  He had a repeat echo in December which showed progression of his AS. His mean gradient was 47 and his peak 78 mmHg. His calculated valve area was 0.87 cm2. His EF was normal and he had grade 2 diastolic dysfunction.  Yesterday he had cardiac catheterization which revealed a mean gradient of 38 mmHg, peak of 58 mmHg, elevated filling pressures and 2 vessel CAD- tight RCA stenosis, moderate circumflex. He had plaque in LAD but no hemodynamically significant.  He says he had some teeth extracted by a dentist 2 months ago but does not have any active dental issues. HE does not check his blood sugar at home.  He feels much less SOB since receiving IV lasix yesterday.  Past Medical History:  Diagnosis Date  . Acute diastolic congestive heart failure ([place])   . Atypical nevi   . Diabetes mellitus   . Dyspnea   . GERD (gastroesophageal reflux disease)   . Hyperlipidemia    hx of transaminitis secondary to statin and he has decided not to use statins secondary to potential side effects.  . Hypertension   . Nephrolithiasis   . Osteoarthritis, knee   . Sleep apnea     Central apnea. Occasionally wears his CPAP at night.  . Transaminitis     Statin-induced  . 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 MRI of the brain 2007 showed acute left lateral medullary infarct  in the distribution of left posterior inferior cerebral artery , narrowing of the left vertebral with severe diminution of flow or acute occlusion. 2-D echo was normal no embolic source found.    Past Surgical History:  Procedure Laterality Date  . CARDIAC CATHETERIZATION N/A 03/15/2016   Procedure: Right/Left Heart Cath and Coronary Angiography;  Surgeon: Burnell Blanks, MD;  Location: [facility];  Service: Cardiovascular;  Laterality: N/A;  . KNEE ARTHROSCOPY W/ PARTIAL MEDIAL MENISCECTOMY  05/12/2005   right, performed by Dr. French Ana for torn medial meniscus.  Marland Kitchen ROTATOR CUFF REPAIR Right 2016    Family History  Problem Relation Age of Onset  . Hypertension Mother   . Diabetes Mother   . Alcohol abuse Father     Social History:  reports that he has never smoked. He has never used smokeless tobacco. He reports that he drinks alcohol. He reports that he does not use drugs.  Allergies:  Allergies  Allergen Reactions  . Crab (Diagnostic) Anaphylaxis  . Penicillins Other (See Comments)    unknown    Medications:  Scheduled: . aspirin EC  81 mg Oral Daily  . furosemide  40 mg Intravenous Q12H  . gabapentin  100 mg Oral Daily  . glipiZIDE  20 mg Oral Daily  . insulin aspart  0-15 Units Subcutaneous TID WC  . losartan  50 mg Oral Daily  . sodium chloride flush  3 mL Intravenous Q12H    Results for orders placed or performed during the hospital encounter of 03/15/16 (from the past 48 hour(s))  Glucose, capillary     Status: Abnormal   Collection Time: 03/15/16  6:39 AM  Result Value Ref Range   Glucose-Capillary 159 (H) 65 - 99 mg/dL  Glucose, capillary     Status: Abnormal   Collection Time: 03/15/16 10:45 AM  Result Value Ref Range   Glucose-Capillary 190 (H) 65 - 99 mg/dL  Glucose, capillary     Status: Abnormal   Collection Time: 03/15/16  1:49 PM  Result Value Ref Range   Glucose-Capillary 323 (H) 65 - 99 mg/dL   Comment 1 Notify RN    Comment 2  Document in Chart   CBC     Status: Abnormal   Collection Time: 03/16/16  2:19 AM  Result Value Ref Range   WBC 8.4 4.0 - 10.5 K/uL   RBC 3.94 (L) 4.22 - 5.81 MIL/uL   Hemoglobin 11.2 (L) 13.0 - 17.0 g/dL   HCT 33.5 (L) 39.0 - 52.0 %   MCV 85.0 78.0 - 100.0 fL   MCH 28.4 26.0 - 34.0 pg   MCHC 33.4 30.0 - 36.0 g/dL   RDW 13.1 11.5 - 15.5 %   Platelets 236 150 - 400 K/uL  Basic metabolic panel     Status: Abnormal   Collection Time: 03/16/16  2:19 AM  Result Value Ref Range   Sodium 138 135 - 145 mmol/L   Potassium 3.9 3.5 - 5.1 mmol/L   Chloride 103 101 - 111 mmol/L   CO2 26 22 - 32 mmol/L   Glucose, Bld 229 (H) 65 - 99 mg/dL   BUN 16 6 - 20 mg/dL   Creatinine, Ser 1.01 0.61 - 1.24 mg/dL   Calcium 9.3 8.9 - 10.3 mg/dL   GFR calc non Af Amer >60 >60 mL/min   GFR calc Af Amer >60 >60 mL/min    Comment: (NOTE) The eGFR has been calculated using the CKD EPI equation. This calculation has not been validated in all clinical situations. eGFR's persistently <60 mL/min signify possible Chronic Kidney Disease.    Anion gap 9 5 - 15    No results found.  Review of Systems  Constitutional: Positive for malaise/fatigue. Negative for chills and fever.  Eyes: Negative for blurred vision and double vision.  Respiratory: Positive for shortness of breath and wheezing.   Cardiovascular: Positive for orthopnea, leg swelling and PND. Negative for claudication.  Gastrointestinal: Positive for heartburn. Negative for blood in stool.  Genitourinary: Negative for dysuria and frequency.  Musculoskeletal: Positive for falls.  Neurological: Negative for focal weakness and loss of consciousness.       Poor equilibrium since stroke. Has to be careful when he walks. If he looks upward he will fall.   Blood pressure 117/64, pulse 70, temperature 97.3 F (36.3 C), temperature source Oral, resp. rate 20, height 5' 8" (1.727 m), weight 230 lb (104.3 kg), SpO2 100 %. Physical Exam  Vitals  reviewed. Constitutional: He is oriented to person, place, and time. He appears well-developed and well-nourished. No distress.  HENT:  Head: Normocephalic and atraumatic.  Poor dentition  Eyes: Conjunctivae and EOM are normal. Pupils are equal, round, and reactive to light. No scleral icterus.  Neck: Neck supple. No thyromegaly present.  Cardiovascular: Normal rate and regular rhythm.   Murmur (3/6 crescendo- decrescendo at RUSB) heard. Respiratory: Effort normal. No respiratory distress. He has no   wheezes. He has no rales.  GI: Soft. He exhibits no distension. There is no tenderness.  Musculoskeletal: Normal range of motion. He exhibits edema (1+).  Lymphadenopathy:    He has no cervical adenopathy.  Neurological: He is alert and oriented to person, place, and time. No cranial nerve deficit.  Motor 5/5 in all 4 ext  Skin: Skin is warm and dry.   ECHOCARDIOGRAM Study Conclusions  - Left ventricle: The cavity size was normal. Wall thickness was   increased in a pattern of moderate LVH. Systolic function was   normal. The estimated ejection fraction was in the range of 55%   to 60%. Wall motion was normal; there were no regional wall   motion abnormalities. Features are consistent with a pseudonormal   left ventricular filling pattern, with concomitant abnormal   relaxation and increased filling pressure (grade 2 diastolic   dysfunction). - Aortic valve: Trileaflet; severely calcified leaflets. There was   severe stenosis. There was trivial regurgitation. Mean gradient   (S): 47 mm Hg. Peak gradient (S): 78 mm Hg. Valve area (VTI):   0.87 cm^2. - Mitral valve: There was mild regurgitation. - Left atrium: The atrium was moderately dilated. - Right ventricle: The cavity size was normal. Systolic function   was normal. - Right atrium: The atrium was mildly dilated. - Pulmonary arteries: No complete TR doppler jet so unable to   estimate PA systolic pressure. - Inferior vena cava:  The vessel was normal in size. The   respirophasic diameter changes were in the normal range (>= 50%),   consistent with normal central venous pressure.  Impressions:  - Normal LV size with moderate LV hypertrophy. EF 55-60%. Moderate   diastolic dysfunction. Normal RV size and systolic function.   Severe aortic stenosis. Mild mitral regurgitation.  CARDIAC CATHETERIZATION Conclusion     There is severe aortic valve stenosis.  Mid RCA lesion, 20 %stenosed.  Dist RCA lesion, 95 %stenosed.  Ost LM lesion, 20 %stenosed.  Prox Cx lesion, 50 %stenosed.  Mid LAD lesion, 40 %stenosed.  Hemodynamic findings consistent with moderate pulmonary hypertension.   1. Severe stenosis distal RCA 2. Moderate stenosis proximal Circumflex and mid LAD 3. Severe aortic stenosis (peak to peak gradient 58 mmHg, mean 38 mmHg, AVA 0.75cm2) 4. Elevated filling pressures.   Recommendations: Will admit for diuresis. Filling pressures elevated. Pt now dyspneic at rest. Will ask CT surgery to see him to discuss planning for CABG and AVR. He will likely need several days of diuresis with IV Lasix.     I personally reviewed the cath and echo images and concur with the findings noted above  Assessment/Plan: 65 yo man with multiple CRF who presents with decompensated diastolic left heart failure manifested by orthopnea and DOE. He has severe AS and significant 2 vessel CAD.   AVR CABG indicated for survival benefit and relief of symptoms.  I discussed the general nature of the procedure, the need for general anesthesia, the need for cardiopulmonary bypass, and the incisions to be used with Mr. Blass. We discussed the expected hospital stay, overall recovery and short and long term outcomes. I informed him of the indications, risks, benefits and alternatives. He understands the risks include, but are not limited to death, stroke, MI, DVT/PE, bleeding, possible need for transfusion, infections, heart  block requiring pacemaker placement, cardiac arrhythmias, as well as other organ system dysfunction including respiratory, renal, or GI complications.   He accepts the risks and agrees to proceed.    We discussed mechanical v tissue valves and their relative advantages and disadvantages. Given age a tissue valve is the preferred option, especially in light of his social situation and variable compliance.  AS of now 1st available date for surgery is a week from today. May be able to go home and come back after he is tuned up medically.  Davide Risdon C Zygmunt Mcglinn 03/16/2016, 2:31 PM     

## 2016-03-31 ENCOUNTER — Encounter (HOSPITAL_COMMUNITY): Payer: Self-pay | Admitting: Thoracic Surgery (Cardiothoracic Vascular Surgery)

## 2016-03-31 ENCOUNTER — Inpatient Hospital Stay (HOSPITAL_COMMUNITY): Payer: Medicare Other

## 2016-03-31 DIAGNOSIS — I4891 Unspecified atrial fibrillation: Secondary | ICD-10-CM

## 2016-03-31 DIAGNOSIS — I350 Nonrheumatic aortic (valve) stenosis: Secondary | ICD-10-CM

## 2016-03-31 DIAGNOSIS — Z951 Presence of aortocoronary bypass graft: Secondary | ICD-10-CM

## 2016-03-31 DIAGNOSIS — Z952 Presence of prosthetic heart valve: Secondary | ICD-10-CM

## 2016-03-31 LAB — CREATININE, SERUM
Creatinine, Ser: 1.1 mg/dL (ref 0.61–1.24)
GFR calc Af Amer: >60 mL/min
GFR calc non Af Amer: >60 mL/min

## 2016-03-31 LAB — CBC
HCT: 24.4 % — ABNORMAL LOW (ref 39.0–52.0)
HCT: 25.9 % — ABNORMAL LOW (ref 39.0–52.0)
Hemoglobin: 8.3 g/dL — ABNORMAL LOW (ref 13.0–17.0)
Hemoglobin: 8.6 g/dL — ABNORMAL LOW (ref 13.0–17.0)
MCH: 28.5 pg (ref 26.0–34.0)
MCH: 28.2 pg (ref 26.0–34.0)
MCHC: 34 g/dL (ref 30.0–36.0)
MCHC: 33.2 g/dL (ref 30.0–36.0)
MCV: 83.8 fL (ref 78.0–100.0)
MCV: 84.9 fL (ref 78.0–100.0)
Platelets: 185 10*3/uL (ref 150–400)
Platelets: 192 K/uL (ref 150–400)
RBC: 2.91 MIL/uL — ABNORMAL LOW (ref 4.22–5.81)
RBC: 3.05 MIL/uL — ABNORMAL LOW (ref 4.22–5.81)
RDW: 13.6 % (ref 11.5–15.5)
RDW: 13.8 % (ref 11.5–15.5)
WBC: 8.4 10*3/uL (ref 4.0–10.5)
WBC: 12 K/uL — ABNORMAL HIGH (ref 4.0–10.5)

## 2016-03-31 LAB — GLUCOSE, CAPILLARY
GLUCOSE-CAPILLARY: 136 mg/dL — AB (ref 65–99)
GLUCOSE-CAPILLARY: 136 mg/dL — AB (ref 65–99)
GLUCOSE-CAPILLARY: 140 mg/dL — AB (ref 65–99)
GLUCOSE-CAPILLARY: 141 mg/dL — AB (ref 65–99)
GLUCOSE-CAPILLARY: 183 mg/dL — AB (ref 65–99)
GLUCOSE-CAPILLARY: 172 mg/dL — AB (ref 65–99)
GLUCOSE-CAPILLARY: 139 mg/dL — AB (ref 65–99)
GLUCOSE-CAPILLARY: 174 mg/dL — AB (ref 65–99)
GLUCOSE-CAPILLARY: 252 mg/dL — AB (ref 65–99)
Glucose-Capillary: 129 mg/dL — ABNORMAL HIGH (ref 65–99)
Glucose-Capillary: 130 mg/dL — ABNORMAL HIGH (ref 65–99)
Glucose-Capillary: 132 mg/dL — ABNORMAL HIGH (ref 65–99)
Glucose-Capillary: 135 mg/dL — ABNORMAL HIGH (ref 65–99)
Glucose-Capillary: 138 mg/dL — ABNORMAL HIGH (ref 65–99)
Glucose-Capillary: 145 mg/dL — ABNORMAL HIGH (ref 65–99)
Glucose-Capillary: 157 mg/dL — ABNORMAL HIGH (ref 65–99)
Glucose-Capillary: 165 mg/dL — ABNORMAL HIGH (ref 65–99)

## 2016-03-31 LAB — MAGNESIUM
Magnesium: 2.1 mg/dL (ref 1.7–2.4)
Magnesium: 1.8 mg/dL (ref 1.7–2.4)

## 2016-03-31 LAB — POCT I-STAT, CHEM 8
BUN: 15 mg/dL (ref 6–20)
CREATININE: 1.1 mg/dL (ref 0.61–1.24)
Calcium, Ion: 1.11 mmol/L — ABNORMAL LOW (ref 1.15–1.40)
Chloride: 99 mmol/L — ABNORMAL LOW (ref 101–111)
Glucose, Bld: 184 mg/dL — ABNORMAL HIGH (ref 65–99)
HEMATOCRIT: 24 % — AB (ref 39.0–52.0)
HEMOGLOBIN: 8.2 g/dL — AB (ref 13.0–17.0)
POTASSIUM: 4.3 mmol/L (ref 3.5–5.1)
SODIUM: 134 mmol/L — AB (ref 135–145)
TCO2: 24 mmol/L (ref 0–100)

## 2016-03-31 LAB — BASIC METABOLIC PANEL
Anion gap: 10 (ref 5–15)
BUN: 13 mg/dL (ref 6–20)
CALCIUM: 7.8 mg/dL — AB (ref 8.9–10.3)
CO2: 21 mmol/L — AB (ref 22–32)
Chloride: 104 mmol/L (ref 101–111)
Creatinine, Ser: 1.07 mg/dL (ref 0.61–1.24)
GFR calc Af Amer: >60 mL/min (ref >60)
GLUCOSE: 137 mg/dL — AB (ref 65–99)
Potassium: 4.2 mmol/L (ref 3.5–5.1)
Sodium: 135 mmol/L (ref 135–145)

## 2016-03-31 MED ORDER — MUPIROCIN 2 % EX OINT
1.0000 "application " | TOPICAL_OINTMENT | Freq: Two times a day (BID) | CUTANEOUS | Status: AC
  Administered 2016-03-31 – 2016-04-04 (×10): 1 via NASAL
  Filled 2016-03-31: qty 22

## 2016-03-31 MED ORDER — METOPROLOL TARTRATE 25 MG PO TABS
25.0000 mg | ORAL_TABLET | Freq: Two times a day (BID) | ORAL | Status: DC
  Administered 2016-03-31: 25 mg via ORAL
  Filled 2016-03-31 (×2): qty 1

## 2016-03-31 MED ORDER — FUROSEMIDE 10 MG/ML IJ SOLN
40.0000 mg | Freq: Once | INTRAMUSCULAR | Status: AC
  Administered 2016-03-31: 40 mg via INTRAVENOUS
  Filled 2016-03-31: qty 4

## 2016-03-31 MED ORDER — INSULIN DETEMIR 100 UNIT/ML ~~LOC~~ SOLN: 50.0000 [IU] | Freq: Two times a day (BID) | SUBCUTANEOUS | Status: DC

## 2016-03-31 MED ORDER — GLIPIZIDE 10 MG PO TABS
10.0000 mg | ORAL_TABLET | Freq: Every day | ORAL | Status: DC
  Administered 2016-03-31 – 2016-04-10 (×11): 10 mg via ORAL
  Filled 2016-03-31 (×12): qty 1

## 2016-03-31 MED ORDER — ENOXAPARIN SODIUM 40 MG/0.4ML ~~LOC~~ SOLN
40.0000 mg | Freq: Every day | SUBCUTANEOUS | Status: DC
  Administered 2016-03-31 – 2016-04-09 (×10): 40 mg via SUBCUTANEOUS
  Filled 2016-03-31 (×10): qty 0.4

## 2016-03-31 MED ORDER — INSULIN ASPART 100 UNIT/ML ~~LOC~~ SOLN
0.0000 [IU] | SUBCUTANEOUS | Status: DC
  Administered 2016-03-31: 2 [IU] via SUBCUTANEOUS
  Administered 2016-03-31: 4 [IU] via SUBCUTANEOUS
  Administered 2016-03-31 – 2016-04-01 (×3): 8 [IU] via SUBCUTANEOUS
  Administered 2016-04-01: 4 [IU] via SUBCUTANEOUS
  Administered 2016-04-01 (×2): 8 [IU] via SUBCUTANEOUS
  Administered 2016-04-01: 2 [IU] via SUBCUTANEOUS
  Administered 2016-04-01: 4 [IU] via SUBCUTANEOUS
  Administered 2016-04-02 (×3): 2 [IU] via SUBCUTANEOUS

## 2016-03-31 MED ORDER — CHLORHEXIDINE GLUCONATE CLOTH 2 % EX PADS
6.0000 | MEDICATED_PAD | Freq: Every day | CUTANEOUS | Status: AC
  Administered 2016-03-31 – 2016-04-04 (×5): 6 via TOPICAL

## 2016-03-31 MED ORDER — INSULIN ASPART 100 UNIT/ML ~~LOC~~ SOLN
4.0000 [IU] | Freq: Three times a day (TID) | SUBCUTANEOUS | Status: DC
  Administered 2016-04-03: 4 [IU] via SUBCUTANEOUS

## 2016-03-31 MED ORDER — ALPRAZOLAM 0.25 MG PO TABS
0.2500 mg | ORAL_TABLET | Freq: Three times a day (TID) | ORAL | Status: DC | PRN
  Administered 2016-03-31 – 2016-04-10 (×10): 0.25 mg via ORAL
  Filled 2016-03-31 (×11): qty 1

## 2016-03-31 MED ORDER — INSULIN DETEMIR 100 UNIT/ML ~~LOC~~ SOLN
50.0000 [IU] | Freq: Two times a day (BID) | SUBCUTANEOUS | Status: DC
  Administered 2016-03-31 (×2): 50 [IU] via SUBCUTANEOUS
  Filled 2016-03-31 (×3): qty 0.5

## 2016-03-31 MED FILL — Heparin Sodium (Porcine) Inj 1000 Unit/ML: INTRAMUSCULAR | Qty: 30 | Status: AC

## 2016-03-31 MED FILL — Potassium Chloride Inj 2 mEq/ML: INTRAVENOUS | Qty: 40 | Status: AC

## 2016-03-31 MED FILL — Magnesium Sulfate Inj 50%: INTRAMUSCULAR | Qty: 10 | Status: AC

## 2016-03-31 NOTE — Progress Notes (Signed)
TCTS BRIEF SICU PROGRESS NOTE  1 Day Post-Op  S/P Procedure(s) (LRB): CORONARY ARTERY BYPASS GRAFTING (CABG) Times Two (N/A) AORTIC VALVE REPLACEMENT (AVR) (N/A) TRANSESOPHAGEAL ECHOCARDIOGRAM (TEE) (N/A) CLIPPING OF LEFT ATRIAL APPENDAGE (N/A)   Stable day NSR w/ PAC's and stable BP Breathing comfortably w/ O2 sats 97% on 2 L/min UOP adequate Labs okay  Plan: Continue current plan  Rexene Alberts, MD 03/31/2016 5:44 PM

## 2016-03-31 NOTE — Consult Note (Signed)
Cardiology Consult    Patient ID: Mark Cabrera MRN: [id number], DOB/AGE: 09/23/1950   Admit date: 03/30/2016 Date of Consult: 03/31/2016  Primary Physician: [name] Kerbs, DO Reason for Consult: Afib Primary Cardiologist: New, Dr. [name] Requesting Provider: Dr. Roxan Hockey  Patient Profile    Mark Cabrera is a 66 year old male with a past medical history of severe AS and CAD (now s/p AVR and CABG with SVG to PDA and SVG to OM1 on 03/30/16). Also with history of DM, HLD, and HTN. Cardiology was consulted as he had some periods of atrial fib pre operatively.   History of Present Illness    Mark Cabrera was seen in our office on 03/10/16 for aortic stenosis, which was known for many years however he was symptomatic with DOE and chest pain. He was referred for right and left heart cath for consideration of TAVR.   He was admitted on 03/15/16 for left and right heart cath which revealed severe distal RCA stenosis, and moderate stenosis in the proximal circumflex and mid LAD. TCTS surgery was consulted for consideration of CABG and AVR. His peak gradient was 58 mm Hg and mean gradient was 38 mmHg. AVA was 0.75 cm2. He was discharged home on 03/18/16 with plans to return for surgery on 03/30/16.   He underwent CABG x 2 with SVG to PDA and SVG to OM1 as well as AVR with 86m Edwards bovine valve on 03/30/16. His left atrial appendage was clipped   According to nursing, he was noted to be in atrial fibrillation prior to surgery, no afib noted post operatively, he was paced in the immediate post op period but it does not appear to be atrial fibrillation underneath pacing.   Past Medical History   Past Medical History:  Diagnosis Date  . Acute diastolic congestive heart failure ([place]   . Aortic valve stenosis   . Atypical nevi   . Constipation   . Coronary artery disease   . Depression    "years ago"  . Diabetes mellitus   . Dyspnea   . GERD (gastroesophageal reflux disease)   . Heart  murmur   . History of kidney stones   . Hyperlipidemia    hx of transaminitis secondary to statin and he has decided not to use statins secondary to potential side effects.  . Hypertension   . Nephrolithiasis   . Osteoarthritis, knee   . Sleep apnea     Central apnea. Not using cpap  . Stroke ([facility] 2007  . Transaminitis     Statin-induced  . [place]bral artery [place]    medullary stroke/PICA,  no significant carotid disease on Dopplers. 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 2-D echo was normal no embolic source found.    Past Surgical History:  Procedure Laterality Date  . CARDIAC CATHETERIZATION N/A 03/15/2016   Procedure: Right/Left Heart Cath and Coronary Angiography;  Surgeon: CBurnell Blanks MD;  [place];  Service: Cardiovascular;  Laterality: N/A;  . KNEE ARTHROSCOPY W/ PARTIAL MEDIAL MENISCECTOMY  05/12/2005   right, performed by Dr. CFrench Anafor torn medial meniscus.  .Marland KitchenROTATOR CUFF REPAIR Right 2016     Allergies  Allergies  Allergen Reactions  . Crab (Diagnostic) Anaphylaxis  . Penicillins Other (See Comments)    Has patient had  a PCN reaction causing immediate rash, facial/tongue/throat swelling, SOB or lightheadedness with hypotension: unknown Has patient had a PCN reaction causing severe rash involving mucus membranes or skin necrosis: unknown Has patient had a PCN reaction that required hospitalization No Has patient had a PCN reaction occurring within the last 10 years: Yes If all of the above answers are "NO", then may proceed with Cephalosporin use.     Inpatient Medications    . acetaminophen  1,000 mg Oral Q6H   Or  . acetaminophen (TYLENOL) oral liquid 160 mg/5 mL  1,000 mg Per Tube Q6H  . aspirin EC  325 mg Oral Daily   Or  . aspirin  324  mg Per Tube Daily  . atorvastatin  80 mg Oral q1800  . bisacodyl  10 mg Oral Daily   Or  . bisacodyl  10 mg Rectal Daily  . chlorhexidine gluconate (MEDLINE KIT)  15 mL Mouth Rinse BID  . Chlorhexidine Gluconate Cloth  6 each Topical Daily  . docusate sodium  200 mg Oral Daily  . enoxaparin (LOVENOX) injection  40 mg Subcutaneous QHS  . gabapentin  100 mg Oral Daily  . glipiZIDE  10 mg Oral QAC breakfast  . insulin aspart  0-24 Units Subcutaneous Q4H  . insulin aspart  4 Units Subcutaneous TID WC  . insulin detemir  50 Units Subcutaneous BID  . insulin regular  0-10 Units Intravenous TID WC  . mouth rinse  15 mL Mouth Rinse BID  . metoprolol tartrate  25 mg Oral BID  . mupirocin ointment  1 application Nasal BID  . [START ON 04/01/2016] pantoprazole  40 mg Oral Daily  . potassium chloride (KCL MULTIRUN) 30 mEq in 265 mL IVPB  30 mEq Intravenous Once  . sodium chloride flush  3 mL Intravenous Q12H    Family History    Family History  Problem Relation Age of Onset  . Hypertension Mother   . Diabetes Mother   . Alcohol abuse Father     Social History    Social History   Social History  . Marital status: Divorced    Spouse name: N/A  . Number of children: 1  . Years of education: 2y college   Occupational History  .  Medicaid /disability Unemployed    following stroke in 2007   Social History Main Topics  . Smoking status: Never Smoker  . Smokeless tobacco: Never Used  . Alcohol use Yes     Comment: occasional  . Drug use: No  . Sexual activity: Not on file   Other Topics Concern  . Not on file   Social History Narrative   Single but has a girlfriend.    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       Patient has medications disability post stroke.              Review of Systems    General:  No chills, fever, night sweats or weight changes.  Cardiovascular:  No chest pain, dyspnea on exertion, edema, orthopnea,  palpitations, paroxysmal nocturnal dyspnea. Dermatological: No rash, lesions/masses Respiratory: No cough, dyspnea Urologic: No hematuria, dysuria Abdominal:   No nausea, vomiting, diarrhea, bright red blood per rectum, melena, or hematemesis Neurologic:  No visual changes, wkns, changes in mental status. All other systems reviewed and are otherwise negative except as noted above.  Physical Exam    Blood pressure 98/79, pulse 71, temperature 98.6 F (37 C),  temperature source Core (Comment), resp. rate 18, height 5' 8" (1.727 m), weight 234 lb 5.6 oz (106.3 kg), SpO2 96 %.  General: Pleasant, NAD Psych: Normal affect. Neuro: Alert and oriented X 3. Moves all extremities spontaneously. HEENT: Normal  Neck: Supple without bruits or JVD. Lungs:  Resp regular and unlabored, CTA. Heart: RRR no s3, s4, or murmurs. Abdomen: Soft, non-tender, non-distended, BS + x 4.  Extremities: No clubbing, cyanosis or edema. DP/PT/Radials 2+ and equal bilaterally.  Labs     Lab Results  Component Value Date   WBC 8.4 03/31/2016   HGB 8.3 (L) 03/31/2016   HCT 24.4 (L) 03/31/2016   MCV 83.8 03/31/2016   PLT 185 03/31/2016    Recent Labs Lab 03/31/16 0400  NA 135  K 4.2  CL 104  CO2 21*  BUN 13  CREATININE 1.07  CALCIUM 7.8*  GLUCOSE 137*   Lab Results  Component Value Date   CHOL 206 (H) 04/25/2013   HDL 47 04/25/2013   LDLCALC 111 (H) 04/25/2013   TRIG 241 (H) 04/25/2013   No results found for: [facility]   Radiology Studies    Dg Chest 2 View  Result Date: 03/20/2016 CLINICAL DATA:  Acute onset of generalized chest pain and cough. Preoperative chest radiograph. Initial encounter. EXAM: CHEST  2 VIEW COMPARISON:  Chest radiograph performed 03/17/2016 FINDINGS: The lungs are well-aerated. Vascular congestion is again noted. Peribronchial thickening is seen. There is no evidence of focal opacification, pleural effusion or pneumothorax. The heart is normal in size; the mediastinal contour  is within normal limits. No acute osseous abnormalities are seen. IMPRESSION: Vascular congestion noted.  Peribronchial thickening seen. Electronically Signed   By: Garald Balding M.D.   On: 03/20/2016 22:07   Dg Chest 2 View  Result Date: 03/17/2016 CLINICAL DATA:  CHF. EXAM: CHEST  2 VIEW COMPARISON:  02/07/2016.  03/04/2010. FINDINGS: Mediastinum and hilar structures normal. Cardiomegaly with mild pulmonary vascular prominence. Mild bilateral interstitial prominence. Findings suggest mild CHF. Small left pleural effusion. No pneumothorax . IMPRESSION: Cardiomegaly with mild bilateral interstitial prominence and small left pleural effusion consistent with mild CHF. Electronically Signed   By: Marcello Moores  Register   On: 03/17/2016 09:02   Dg Chest Port 1 View  Result Date: 03/31/2016 CLINICAL DATA:  Status post CABG x2. Hypertension. Diabetes. Stroke. EXAM: PORTABLE CHEST 1 VIEW COMPARISON:  03/30/2016 FINDINGS: Extubation. Removal of nasogastric tube. Right internal jugular Swan-Ganz catheter unchanged with tip at pulmonary outflow tract. Median sternotomy for aortic valve repair. Mediastinal drain remains in place. Right chest tube has been removed. Midline trachea. Cardiomegaly accentuated by AP portable technique. No pleural fluid. 5% right apical pneumothorax is identified. Mild left base atelectasis. Low lung volumes. IMPRESSION: Removal of support apparatus, including right-sided chest tube. A 5% right apical pneumothorax is identified. Cardiomegaly and low lung volumes with left base atelectasis. These results will be called to the ordering clinician or representative by the Radiologist Assistant, and communication documented in the PACS or zVision Dashboard. Electronically Signed   By: Abigail Miyamoto M.D.   On: 03/31/2016 07:23   Dg Chest Port 1 View  Result Date: 03/30/2016 CLINICAL DATA:  Status post CABG x2.  Chest tube placement. EXAM: PORTABLE CHEST 1 VIEW COMPARISON:  Preop study from 03/20/2016  FINDINGS: Heart is borderline enlarged. The patient is status post median sternotomy with aortic valvular replacement. Endotracheal tube tip is 3.5 cm above the carina in satisfactory position. Gastric tube extends into the left upper  quadrant of the abdomen in the expected location of the stomach. A Swan-Ganz catheter is noted from right sided approach with tip in the expected region of the left pulmonary artery. Right-sided chest tube is seen along the medial aspect of the right hemithorax projecting up to the right posterior fourth rib level. No significant effusion or pneumothorax. No suspicious osseous abnormality. IMPRESSION: Right-sided chest tube projects up to the posterior right fourth rib level without pneumothorax identified. Other support lines and tubes as above described. Electronically Signed   By: Ashley Royalty M.D.   On: 03/30/2016 14:44    EKG & Cardiac Imaging    EKG: NSR   Echocardiogram: last was in 02/2016 Study Conclusions  - Left ventricle: The cavity size was normal. Wall thickness was   increased in a pattern of moderate LVH. Systolic function was   normal. The estimated ejection fraction was in the range of 55%   to 60%. Wall motion was normal; there were no regional wall   motion abnormalities. Features are consistent with a pseudonormal   left ventricular filling pattern, with concomitant abnormal   relaxation and increased filling pressure (grade 2 diastolic   dysfunction). - Aortic valve: Trileaflet; severely calcified leaflets. There was   severe stenosis. There was trivial regurgitation. Mean gradient   (S): 47 mm Hg. Peak gradient (S): 78 mm Hg. Valve area (VTI):   0.87 cm^2. - Mitral valve: There was mild regurgitation. - Left atrium: The atrium was moderately dilated. - Right ventricle: The cavity size was normal. Systolic function   was normal. - Right atrium: The atrium was mildly dilated. - Pulmonary arteries: No complete TR doppler jet so unable to    estimate PA systolic pressure. - Inferior vena cava: The vessel was normal in size. The   respirophasic diameter changes were in the normal range (>= 50%),   consistent with normal central venous pressure.  Impressions:  - Normal LV size with moderate LV hypertrophy. EF 55-60%. Moderate   diastolic dysfunction. Normal RV size and systolic function.   Severe aortic stenosis. Mild mitral regurgitation  Assessment & Plan    1. Atrial fibrillation: Reviewed strips and telemetry. There is a strip saved in the chart on 03/17/16 that looks like atrial fib but I cannot find any other documented atrial fibrillation. Will continue to follow on telemetry and if atrial fib reoccurs we can address anticoagulation and needs for antiarrhythmics.   Further MD recommendations to follow.   Signed, Arbutus Leas, NP 03/31/2016, 11:17 AM Pager: [phone]  Agree with note by Jettie Booze NP  Asked to see pt with Afib. S/P CABG/AVR/ LAA clipping. No AFIB on tele. Discussed with his nurse as well. Exam notable for diffuse rhonchi. Will see again if he has recurrent afib or when he gets transferred to tele.   Lorretta Harp, M.D., [place], [facility], [place] [address] [place], [place]  [id number]  [phone] 03/31/2016 2:19 PM

## 2016-03-31 NOTE — Progress Notes (Signed)
Patient refusing the use of CPAP at this time. Patient is currently on [facility] with sats of 97% and states he just wants to wear that tonight. RT informed patient if he changes his mind have RN contact RT.

## 2016-03-31 NOTE — Care Management Note (Signed)
Case Management Note Marvetta Gibbons RN, BSN Unit 2W-Case Manager [phone]  Patient Details  Name: KIRA TROM MRN: [id number] Date of Birth: 01-07-1951  Subjective/Objective:    Pt admitted with Acute CHF, found CAD- needs CABG- plan for 03/23/16                Action/Plan: PTA pt lived in shed- per MD note with no electricity or heat- also has no bathroom. - CSW consulted for possible SNF placement post-op due to housing issues.   Expected Discharge Date:                  Expected Discharge Plan:  Home/Self Care  In-House Referral:     Discharge planning Services     Post Acute Care Choice:    Choice offered to:     DME Arranged:    DME Agency:     HH Arranged:    HH Agency:     Status of Service:     If discussed at [employer] of Stay Meetings, dates discussed:    Additional Comments: 03/31/2016 Pt plans to discharge to his brothers home - brother and wife will be with patient 24/7 as recommended per pt.  CM was unable to contact brother to verify plan. Maryclare Labrador, RN 03/31/2016, 3:05 PM

## 2016-03-31 NOTE — Progress Notes (Signed)
1 Day Post-Op Procedure(s) (LRB): CORONARY ARTERY BYPASS GRAFTING (CABG) Times Two (N/A) AORTIC VALVE REPLACEMENT (AVR) (N/A) TRANSESOPHAGEAL ECHOCARDIOGRAM (TEE) (N/A) CLIPPING OF LEFT ATRIAL APPENDAGE (N/A) Subjective: C/o feeling short of breath, very anxious Denies pain  Objective: Vital signs in last 24 hours: Temp:  [98.8 F (37.1 C)-100.4 F (38 C)] 98.8 F (37.1 C) (01/19 0815) Pulse Rate:  [69-97] 83 (01/19 0815) Cardiac Rhythm: Normal sinus rhythm (01/19 0800) Resp:  [11-33] 28 (01/19 0815) BP: (85-163)/(56-82) 144/74 (01/19 0815) SpO2:  [90 %-100 %] 95 % (01/19 0815) Arterial Line BP: (81-225)/(46-75) 183/71 (01/19 0815) FiO2 (%):  [35 %-50 %] 35 % (01/19 0600) Weight:  [234 lb 5.6 oz (106.3 kg)] 234 lb 5.6 oz (106.3 kg) (01/19 0445)  Hemodynamic parameters for last 24 hours: PAP: (26-52)/(10-32) 33/12 CO:  [5.2 L/min-7.9 L/min] 7.9 L/min CI:  [2.4 L/min/m2-3.7 L/min/m2] 3.7 L/min/m2  Intake/Output from previous day: 01/18 0701 - 01/19 0700 In: 6186.7 [P.O.:290; I.V.:4236.7; Blood:530; NG/GT:30; IV [id number] Out: [id number]; Emesis/NG output:50; Blood:1625; Chest Tube:260] Intake/Output this shift: Total I/O In: 81.5 [I.V.:81.5] Out: 125 [Urine:125]  General appearance: alert, cooperative and no distress Neurologic: intact Heart: regular rate and rhythm Lungs: diminished breath sounds bilaterally Abdomen: normal findings: soft, non-tender  Lab Results:  Recent Labs  03/30/16 2010 03/30/16 2011 03/31/16 0400  WBC 9.8  --  8.4  HGB 9.1* 8.5* 8.3*  HCT 27.2* 25.0* 24.4*  PLT 218  --  185   BMET:  Recent Labs  03/30/16 2010 03/30/16 2011 03/31/16 0400  NA 136 138 135  K 4.7 4.7 4.2  CL 105 106 104  CO2 20*  --  21*  GLUCOSE 149* 150* 137*  BUN 12 13 13   CREATININE 0.91 0.80 1.07  CALCIUM 7.8*  --  7.8*    PT/INR:  Recent Labs  03/30/16 1420  LABPROT 17.0*  INR 1.38   ABG    Component Value Date/Time   PHART 7.388  03/30/2016 1850   HCO3 21.8 03/30/2016 1850   TCO2 23 03/30/2016 2011   ACIDBASEDEF 3.0 (H) 03/30/2016 1850   O2SAT 99.0 03/30/2016 1850   CBG (last 3)   Recent Labs  03/31/16 0605 03/31/16 0656 03/31/16 0823  GLUCAP 140* 141* 135*    Assessment/Plan: S/P Procedure(s) (LRB): CORONARY ARTERY BYPASS GRAFTING (CABG) Times Two (N/A) AORTIC VALVE REPLACEMENT (AVR) (N/A) TRANSESOPHAGEAL ECHOCARDIOGRAM (TEE) (N/A) CLIPPING OF LEFT ATRIAL APPENDAGE (N/A) -NEURO- no focal deficit, extremely anxious- will order xanax PRN  CV- hemodynamics good- dc swan  Hypertension- increase beta blocker  Atrial fib- new onset noted on arrival yesterday, now in SR, left atrial appendage was clipped  RESP- CXR shows a small right pneumothorax. Poor cough effort so unclear if there is an air leak  Therefore will leave CT in place  Atelectasis- IS  RENAL- creatinine OK, diurese for volume overload  ENDO- CBG controlled with high dose insulin drip currently 6 units per hour  Transition to levemir + SSI, restart glipizide  Anemia- secondary to ABL- no need for transfusion, follow     LOS: 1 day    [name] 03/31/2016

## 2016-03-31 NOTE — Op Note (Signed)
NAMEMarland Kitchen  RUDYARD, MCNEMAR NO.:  [id number]  MEDICAL RECORD NO.:  [id number]  LOCATION:  2S13C                        FACILITY:  [facility]  PHYSICIAN:  Revonda Standard. Roxan Hockey, M.D.DATE OF BIRTH:  1950/04/10  DATE OF PROCEDURE:  03/30/2016 DATE OF DISCHARGE:                              OPERATIVE REPORT   PREOPERATIVE DIAGNOSIS:  Severe aortic stenosis with 2-vessel coronary Disease. New onset atrial fibrillation  POSTOPERATIVE DIAGNOSIS:  Severe aortic stenosis with 2-vessel coronary Disease. New onset atrial fibrillation  PROCEDURE:  Median sternotomy, extracorporeal circulation Coronary artery bypass grafting x 2  Saphenous vein graft to posterior descending  Saphenous vein graft to obtuse marginal 1 Endoscopic vein harvest right leg Aortic valve replacement with 23 mm [facility] Ease bovine pericardial valve. Left atrial appendage clip  SURGEON:  Remo Lipps C. Roxan Hockey, MD.  ASSISTANT:  Nicholes Rough, PA.  ANESTHESIA:  General.  FINDINGS:  1.In new onset atrial fibrillation in preop holding.  2.Transesophageal echocardiography showed ejection fraction approximately 50% with severe aortic stenosis and mild aortic insufficiency.  There was mild mitral regurgitation as well. 3.Intraoperative findings- good quality vein and good quality targets.  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   4.Post bypass transesophageal echocardiography revealed good function of the prosthetic valve with no perivalvular leaks and no change in left ventricular function.  CLINICAL NOTE:  Mr. Dommer is a 66 year old gentleman with multiple medical problems who presented with a 22-month history of dyspnea on exertion.  This had progressed and become more frequent and severe over the weeks leading up to his evaluation.  He underwent an echocardiogram which showed progression of aortic stenosis from moderate to severe.  He then had  cardiac catheterization, which showed aortic stenosis, elevated filling pressures, and two-vessel coronary disease with a tight right coronary stenosis and moderate disease in the circumflex.  There was plaque but no hemodynamically significant disease in the LAD.  He was diuresed and improved clinically.  He then was discharged and now returns for aortic valve replacement and coronary bypass grafting.  The indications, risks, benefits, and alternatives were discussed in detail with the patient.  He understood and accepted the risks and agreed to proceed.  OPERATIVE NOTE:  Mr. Behnen was brought to the preoperative holding area on March 30, 2016.  Anesthesia placed a Swan-Ganz catheter.  He had moderate pulmonary hypertension. An arterial blood pressure monitoring line was placed.  He was taken to the operating room, anesthetized, and intubated.  Intravenous antibiotics were administered.  Transesophageal echocardiography was performed.  Findings as noted above.  A Foley catheter was placed.  The chest, abdomen, and legs were prepped and draped in usual sterile fashion.  An incision was made in the right leg at the level of the knee.  The greater saphenous vein was harvested from the right thigh endoscopically.  It was a good quality vessel, although it did become small in the upper thigh after bifurcation.  There was adequate length of good quality vein for the grafts. A median sternotomy was performed while the vein was being harvested and 2000 units of heparin was administered during the vein harvest.  After the vein was removed from the leg, the remainder of the full heparin dose was given.  The pericardium was opened. After confirming adequate anticoagulation with ACT measurement, the aorta was cannulated via concentric 2-0 Ethibond pledgeted pursestring sutures.  The aorta was enlarged at approximately 3.5 cm, but not aneurysmal and there was no evidence of  atherosclerotic disease at the site of cannulation or the proximal placement.  There was atherosclerotic disease more proximally noted after the aortotomy for the aortic valve replacement.  A dual stage venous cannula was placed via pursestring suture in the right atrial appendage.  Cardiopulmonary bypass was initiated. Carbon dioxide was insufflated into the operative field.  Flows were maintained per protocol and the patient was cooled to 32 degrees Celsius.  A left ventricular vent was placed via pursestring suture in the right superior pulmonary vein.  A retrograde cardioplegia cannula was placed via pursestring suture in the right atrium and directed in the coronary sinus.  An antegrade cardioplegia cannula was placed in the ascending aorta.  A temperature probe was placed in myocardial septum.  A foam pad was placed in the pericardium to insulate the heart.  The aorta was crossclamped.  The left ventricle was emptied via aortic root vent.  Cardiac arrest then was achieved with a combination of cold antegrade and retrograde blood cardioplegia and topical iced saline.  500 mL of cardioplegia was administered antegrade.  There was a rapid diastolic arrest.  An additional liter of cardioplegia was administered retrograde and there was septal cooling to 10 degrees Celsius.  A reversed saphenous vein graft was placed end-to-side to the posterior descending branch of the right coronary.  This was a 1.5 mm good quality target.  The vein was of good quality.  An end-to-side anastomosis was performed with a running 7-0 Prolene suture.  A probe passed easily distally at the completion of anastomosis.  Cardioplegia was administered down the graft.  There were good flow and good hemostasis.  Next, reversed saphenous vein graft was placed end-to-side to obtuse marginal 1.  As noted, he had a 50% proximal circumflex stenosis.  OM1 was a 2 mm good quality target vessel.  The vein was of good  quality. An end-to-side anastomosis was performed with a running 7-0 Prolene suture.  Again, the probe passed easily proximally and distally from the anastomosis.  Cardioplegia was administered and there was good flow and good hemostasis.  Additional cardioplegia was administered retrograde.  An aortotomy was performed and extended into the noncoronary sinus of Valsalva.  The aortic valve was inspected.  It was a tricuspid valve.  The leaflets were heavily calcified and stenotic with very limited mobility.  The valve leaflets were excised.  There was severe annular calcification. The annulus was debrided and care taken to contain all calcific debris. There was some significant plaque in the ascending aorta distal to the takeoff of the right coronary.  This plaque was mostly along the anterior aspect of the right and noncoronary sinuses of Valsalva.  After the annular calcification was debrided, the annulus was copiously irrigated with iced saline.  Additional cardioplegia was administered at 20-minute intervals during the valve replacement portion of the procedure.  The annulus sized for a 23 mm [facility] Ease bovine pericardial valve.  The valve was prepared per manufacturer's recommendations.  2-0 Ethibond horizontal mattress sutures with subannular pledgets were placed circumferentially around the annulus, 17 sutures were used in all.  The sutures then were passed through the sewing ring  of the valve.  The valve was lowered into place and the sutures were sequentially tied, periodically checking to ensure that the valve was well seated.  After all the sutures were tied, the coronary ostia were inspected.  There was no impingement.  The annulus was probed with a fine tip right-angle and there was no gap noted.  The valve was well seated on the annulus.  The aortotomy closure then was performed in 2 layers.  The first was a running 4-0 Prolene horizontal mattress suture followed  by a running 4-0 Prolene simple suture.  Rewarming was begun during the aortotomy closure.  The cardioplegia cannula was removed from the ascending aorta.  The vein grafts were cut to length and the proximal vein graft anastomoses were performed to 4.5 mm punch aortotomies with running 6-0 Prolene sutures. At the completion of the final proximal anastomosis, a warm dose of retrograde cardioplegia was administered.  Deairing maneuvers were performed.  Lidocaine was administered and the aortic crossclamp was removed.  Total crossclamp time was 126 minutes.  The patient was initially in heart block, but did not require defibrillation.  He spontaneously resumed sinus rhythm.  While rewarming was completed, the aortotomy and the proximal and distal anastomoses were inspected for hemostasis. A 40 mm Atri-cure clip was placed across the base of the left atrial appendage. Epicardial pacing wires were placed on the right ventricle and right atrium.  When the patient had rewarmed to a core temperature of 37 degrees Celsius, the retrograde cardioplegia cannula and left ventricular vents were removed.  There was some residual air in the left ventricle which was removed with an Angiocath through the apex.  He then was weaned from cardiopulmonary bypass on the first attempt without difficulty.  The total bypass time was 172 minutes.  He was initially in sinus rhythm, but was atrially paced for rate.  After separation from bypass, he did not require inotropic support.  Post bypass transesophageal echocardiography showed good function of the prosthetic valve with no perivalvular leak.  Left ventricular function was unchanged.  There was no change in the mild mitral regurgitation.  A test dose of protamine was administered and was well tolerated.  The atrial and aortic cannulae were removed.  The remainder of the protamine was administered without incident.  The chest was irrigated with warm saline.   Hemostasis was achieved.  A mediastinal chest tube was placed through a separate subcostal incision and secured with #1 silk suture. The pericardium was reapproximated over the ascending aorta with interrupted 3-0 silk sutures.  The sternum was closed with a combination of single and double heavy gauge stainless steel wires.  The pectoralis fascia, subcutaneous tissue, and skin were closed in standard fashion. All sponge, needle, and instrument counts were correct at the end of the procedure.  The patient was taken from the operating room to the surgical intensive care unit intubated and in good condition.     Revonda Standard Roxan Hockey, M.D.     SCH/MEDQ  D:  03/30/2016  T:  03/31/2016  Job:  [id number]

## 2016-03-31 NOTE — Care Management Note (Signed)
Case Management Note Marvetta Gibbons RN, BSN Unit 2W-Case Manager [phone]  Patient Details  Name: Mark Cabrera MRN: [id number] Date of Birth: 10/05/1950  Subjective/Objective:    Pt admitted with Acute CHF, found CAD- needs CABG- plan for 03/23/16                Action/Plan: PTA pt lived in shed- per MD note with no electricity or heat- also has no bathroom. - CSW consulted for possible SNF placement post-op due to housing issues.   Expected Discharge Date:                  Expected Discharge Plan:  Skilled Nursing Facility  In-House Referral:  Clinical Social Work  Discharge planning Services  CM Consult  Post Acute Care Choice:    Choice offered to:     DME Arranged:    DME Agency:     HH Arranged:    [place] Agency:     Status of Service:  In process, will continue to follow  If discussed at Long Length of Stay Meetings, dates discussed:    Additional Comments: 03/31/2016 Pt plans to discharge to his brothers home - brother and wife will be with patient 24/7 as recommended per pt.  CM was unable to contact brother to verify plan. Maryclare Labrador, RN 03/31/2016, 3:02 PM

## 2016-04-01 ENCOUNTER — Encounter (HOSPITAL_COMMUNITY): Payer: Self-pay | Admitting: *Deleted

## 2016-04-01 ENCOUNTER — Inpatient Hospital Stay (HOSPITAL_COMMUNITY): Payer: Medicare Other

## 2016-04-01 LAB — CBC
HCT: 26.8 % — ABNORMAL LOW (ref 39.0–52.0)
Hemoglobin: 8.7 g/dL — ABNORMAL LOW (ref 13.0–17.0)
MCH: 27.6 pg (ref 26.0–34.0)
MCHC: 32.5 g/dL (ref 30.0–36.0)
MCV: 85.1 fL (ref 78.0–100.0)
PLATELETS: 246 10*3/uL (ref 150–400)
RBC: 3.15 MIL/uL — AB (ref 4.22–5.81)
RDW: 13.9 % (ref 11.5–15.5)
WBC: 13.4 10*3/uL — AB (ref 4.0–10.5)

## 2016-04-01 LAB — BASIC METABOLIC PANEL
ANION GAP: 7 (ref 5–15)
BUN: 19 mg/dL (ref 6–20)
CALCIUM: 8 mg/dL — AB (ref 8.9–10.3)
CO2: 24 mmol/L (ref 22–32)
CREATININE: 1.19 mg/dL (ref 0.61–1.24)
Chloride: 101 mmol/L (ref 101–111)
GFR calc non Af Amer: >60 mL/min (ref >60)
Glucose, Bld: 249 mg/dL — ABNORMAL HIGH (ref 65–99)
Potassium: 4.3 mmol/L (ref 3.5–5.1)
SODIUM: 132 mmol/L — AB (ref 135–145)

## 2016-04-01 LAB — GLUCOSE, CAPILLARY
GLUCOSE-CAPILLARY: 219 mg/dL — AB (ref 65–99)
GLUCOSE-CAPILLARY: 216 mg/dL — AB (ref 65–99)
GLUCOSE-CAPILLARY: 199 mg/dL — AB (ref 65–99)
GLUCOSE-CAPILLARY: 155 mg/dL — AB (ref 65–99)
Glucose-Capillary: 188 mg/dL — ABNORMAL HIGH (ref 65–99)
Glucose-Capillary: 228 mg/dL — ABNORMAL HIGH (ref 65–99)
Glucose-Capillary: 262 mg/dL — ABNORMAL HIGH (ref 65–99)

## 2016-04-01 MED ORDER — CARVEDILOL 6.25 MG PO TABS
6.2500 mg | ORAL_TABLET | Freq: Two times a day (BID) | ORAL | Status: DC
  Administered 2016-04-01 – 2016-04-05 (×6): 6.25 mg via ORAL
  Filled 2016-04-01 (×8): qty 1

## 2016-04-01 MED ORDER — SODIUM CHLORIDE 0.9 % IV SOLN: 250.0000 mL | INTRAVENOUS | Status: DC | PRN

## 2016-04-01 MED ORDER — SODIUM CHLORIDE 0.9% FLUSH
3.0000 mL | Freq: Two times a day (BID) | INTRAVENOUS | Status: DC
  Administered 2016-04-01 – 2016-04-10 (×11): 3 mL via INTRAVENOUS

## 2016-04-01 MED ORDER — MOVING RIGHT ALONG BOOK
Freq: Once | Status: AC
  Administered 2016-04-01: 09:00:00
  Filled 2016-04-01: qty 1

## 2016-04-01 MED ORDER — LOSARTAN POTASSIUM 50 MG PO TABS
50.0000 mg | ORAL_TABLET | Freq: Every day | ORAL | Status: DC
  Administered 2016-04-03 – 2016-04-04 (×2): 50 mg via ORAL
  Filled 2016-04-01 (×4): qty 1

## 2016-04-01 MED ORDER — POTASSIUM CHLORIDE CRYS ER 20 MEQ PO TBCR
20.0000 meq | EXTENDED_RELEASE_TABLET | Freq: Two times a day (BID) | ORAL | Status: DC
  Administered 2016-04-01 – 2016-04-07 (×14): 20 meq via ORAL
  Filled 2016-04-01 (×14): qty 1

## 2016-04-01 MED ORDER — AMIODARONE HCL 200 MG PO TABS
400.0000 mg | ORAL_TABLET | Freq: Two times a day (BID) | ORAL | Status: DC
  Administered 2016-04-01 – 2016-04-10 (×19): 400 mg via ORAL
  Filled 2016-04-01 (×19): qty 2

## 2016-04-01 MED ORDER — SODIUM CHLORIDE 0.9% FLUSH: 3.0000 mL | INTRAVENOUS | Status: DC | PRN

## 2016-04-01 MED ORDER — FUROSEMIDE 40 MG PO TABS
40.0000 mg | ORAL_TABLET | Freq: Two times a day (BID) | ORAL | Status: DC
  Administered 2016-04-01 – 2016-04-02 (×3): 40 mg via ORAL
  Filled 2016-04-01 (×3): qty 1

## 2016-04-01 MED ORDER — INSULIN DETEMIR 100 UNIT/ML ~~LOC~~ SOLN
70.0000 [IU] | Freq: Two times a day (BID) | SUBCUTANEOUS | Status: DC
  Administered 2016-04-01 – 2016-04-03 (×5): 70 [IU] via SUBCUTANEOUS
  Filled 2016-04-01 (×5): qty 0.7

## 2016-04-01 NOTE — Progress Notes (Addendum)
Patient placed on CPAP QHS and tolerating well. 4L O2 bleed in going well with full face mask. Patient resting well and will continue to be monitored.

## 2016-04-01 NOTE — Progress Notes (Addendum)
[address]       [place] [address]             [phone]        CARDIOTHORACIC SURGERY PROGRESS NOTE   R2 Days Post-Op Procedure(s) (LRB): CORONARY ARTERY BYPASS GRAFTING (CABG) Times Two (N/A) AORTIC VALVE REPLACEMENT (AVR) (N/A) TRANSESOPHAGEAL ECHOCARDIOGRAM (TEE) (N/A) CLIPPING OF LEFT ATRIAL APPENDAGE (N/A)  Subjective: Somewhat anxious but reports feeling better.  Expected soreness in chest.  Denies SOB.  Poor appetite.  Refusing to take metoprolol - thinks he's allergic to it  Objective: Vital signs: BP Readings from Last 1 Encounters:  04/01/16 (!) 142/67   Pulse Readings from Last 1 Encounters:  04/01/16 82   Resp Readings from Last 1 Encounters:  04/01/16 20   Temp Readings from Last 1 Encounters:  04/01/16 97.2 F (36.2 C) (Oral)    Hemodynamics:    Physical Exam:  Rhythm:   Afib w/ HR 80-90  Breath sounds: Shallow but clear  Heart sounds:  irregular  Incisions:  Dressings dry, intact  Abdomen:  Soft, non-distended, non-tender  Extremities:  Warm, well-perfused  Chest tubes:  low volume thin serosanguinous output, no air leak    Intake/Output from previous day: 01/19 0701 - 01/20 0700 In: 1635.2 [P.O.:780; I.V.:705.2; IV Piggyback:150] Out: 2250 [Urine:1910; Chest Tube:340] Intake/Output this shift: No intake/output data recorded.  Lab Results:  CBC: Recent Labs  03/31/16 1621 04/01/16 0414  WBC 12.0* 13.4*  HGB 8.6* 8.7*  HCT 25.9* 26.8*  PLT 192 246    BMET:  Recent Labs  03/31/16 0400 03/31/16 1614 03/31/16 1621 04/01/16 0414  NA 135 134*  --  132*  K 4.2 4.3  --  4.3  CL 104 99*  --  101  CO2 21*  --   --  24  GLUCOSE 137* 184*  --  249*  BUN 13 15  --  19  CREATININE 1.07 1.10 1.10 1.19  CALCIUM 7.8*  --   --  8.0*     PT/INR:   Recent Labs  03/30/16 1420  LABPROT 17.0*  INR 1.38    CBG (last 3)   Recent Labs  03/31/16 1932 04/01/16 0007 04/01/16 0442  GLUCAP 252* 262* 228*      ABG    Component Value Date/Time   PHART 7.388 03/30/2016 1850   PCO2ART 36.4 03/30/2016 1850   PO2ART 126.0 (H) 03/30/2016 1850   HCO3 21.8 03/30/2016 1850   TCO2 24 03/31/2016 1614   ACIDBASEDEF 3.0 (H) 03/30/2016 1850   O2SAT 99.0 03/30/2016 1850    CXR: PORTABLE CHEST 1 VIEW  COMPARISON:  March 31, 2016  FINDINGS: The PA catheter has been removed with a remaining right IJ sheath. The tiny right apical pneumothorax probably persists, similar to slightly more larger in the interval. No left-sided pneumothorax. Stable atelectasis in the left base. No other interval changes.  IMPRESSION: 1. A tiny right apical pneumothorax likely remains, similar to slightly larger in the interval. 2. Persistent left atelectasis. 3. No other significant interval change. These results will be called to the ordering clinician or representative by the Radiologist Assistant, and communication documented in the PACS or zVision Dashboard.   Electronically Signed   By: Dorise Bullion III M.D   On: 04/01/2016 08:22   EKG: Afib w/ controlled rate     Assessment/Plan: S/P Procedure(s) (LRB): CORONARY ARTERY BYPASS GRAFTING (CABG) Times Two (N/A) AORTIC VALVE REPLACEMENT (AVR) (N/A) TRANSESOPHAGEAL ECHOCARDIOGRAM (TEE) (  N/A) CLIPPING OF LEFT ATRIAL APPENDAGE (N/A)  Overall stable POD2 Afib w/ controlled rate, BP stable Breathing comfortably w/ O2 sats 98% on 2 L/min via [place] Expected post op acute blood loss anemia, Hgb stable Chronic combined systolic and diastolic CHF with expected post-op volume excess, I/O's slightly negative yesterday but weight still 12 lbs > preop Type II diabetes mellitus, CBG's >200 last 12-24 hrs   Start oral amiodarone  Try carvedilol since patient refusing to take metoprolol  Mobilize  Diuresis  D/C chest tubes  Increase levemir insulin  Restart Cozaar  Transfer step down   Rexene Alberts, MD 04/01/2016 8:45 AM

## 2016-04-02 ENCOUNTER — Inpatient Hospital Stay (HOSPITAL_COMMUNITY): Payer: Medicare Other

## 2016-04-02 LAB — CBC
HCT: 26.2 % — ABNORMAL LOW (ref 39.0–52.0)
HEMOGLOBIN: 8.3 g/dL — AB (ref 13.0–17.0)
MCH: 26.9 pg (ref 26.0–34.0)
MCHC: 31.7 g/dL (ref 30.0–36.0)
MCV: 85.1 fL (ref 78.0–100.0)
PLATELETS: 299 10*3/uL (ref 150–400)
RBC: 3.08 MIL/uL — AB (ref 4.22–5.81)
RDW: 13.7 % (ref 11.5–15.5)
WBC: 12.5 10*3/uL — ABNORMAL HIGH (ref 4.0–10.5)

## 2016-04-02 LAB — URINALYSIS, ROUTINE W REFLEX MICROSCOPIC
Bilirubin Urine: NEGATIVE
Glucose, UA: NEGATIVE mg/dL
Hgb urine dipstick: NEGATIVE
KETONES UR: NEGATIVE mg/dL
LEUKOCYTES UA: NEGATIVE
NITRITE: NEGATIVE
PROTEIN: NEGATIVE mg/dL
Specific Gravity, Urine: 1.023 (ref 1.005–1.030)
pH: 5 (ref 5.0–8.0)

## 2016-04-02 LAB — GLUCOSE, CAPILLARY
GLUCOSE-CAPILLARY: 121 mg/dL — AB (ref 65–99)
GLUCOSE-CAPILLARY: 101 mg/dL — AB (ref 65–99)
Glucose-Capillary: 136 mg/dL — ABNORMAL HIGH (ref 65–99)
Glucose-Capillary: 119 mg/dL — ABNORMAL HIGH (ref 65–99)
Glucose-Capillary: 200 mg/dL — ABNORMAL HIGH (ref 65–99)
Glucose-Capillary: 161 mg/dL — ABNORMAL HIGH (ref 65–99)

## 2016-04-02 LAB — BASIC METABOLIC PANEL
Anion gap: 6 (ref 5–15)
BUN: 25 mg/dL — ABNORMAL HIGH (ref 6–20)
CHLORIDE: 102 mmol/L (ref 101–111)
CO2: 25 mmol/L (ref 22–32)
CREATININE: 1.22 mg/dL (ref 0.61–1.24)
Calcium: 8.1 mg/dL — ABNORMAL LOW (ref 8.9–10.3)
GFR, EST NON AFRICAN AMERICAN: 60 mL/min — AB (ref >60)
Glucose, Bld: 125 mg/dL — ABNORMAL HIGH (ref 65–99)
POTASSIUM: 4 mmol/L (ref 3.5–5.1)
Sodium: 133 mmol/L — ABNORMAL LOW (ref 135–145)

## 2016-04-02 MED ORDER — FUROSEMIDE 40 MG PO TABS
40.0000 mg | ORAL_TABLET | Freq: Two times a day (BID) | ORAL | Status: DC
  Administered 2016-04-03: 40 mg via ORAL
  Filled 2016-04-02: qty 1

## 2016-04-02 MED ORDER — FUROSEMIDE 10 MG/ML IJ SOLN
40.0000 mg | Freq: Two times a day (BID) | INTRAMUSCULAR | Status: AC
  Administered 2016-04-02: 40 mg via INTRAVENOUS
  Filled 2016-04-02: qty 4

## 2016-04-02 NOTE — Progress Notes (Addendum)
[address]       [place] [address]             [phone]        CARDIOTHORACIC SURGERY PROGRESS NOTE   R3 Days Post-Op Procedure(s) (LRB): CORONARY ARTERY BYPASS GRAFTING (CABG) Times Two (N/A) AORTIC VALVE REPLACEMENT (AVR) (N/A) TRANSESOPHAGEAL ECHOCARDIOGRAM (TEE) (N/A) CLIPPING OF LEFT ATRIAL APPENDAGE (N/A)  Subjective: No specific complaints.  Mild soreness in chest.  Appetite slightly improved.  Needs a lot of encouragement to move.  Objective: Vital signs: BP Readings from Last 1 Encounters:  04/02/16 119/79   Pulse Readings from Last 1 Encounters:  04/02/16 (!) 45   Resp Readings from Last 1 Encounters:  04/02/16 14   Temp Readings from Last 1 Encounters:  04/02/16 97.5 F (36.4 C) (Oral)    Hemodynamics:    Physical Exam:  Rhythm:   Afib w/ controlled rate  Breath sounds: clear  Heart sounds:  irregular  Incisions:  Dressings dry, intact  Abdomen:  Soft, non-distended, non-tender  Extremities:  Warm, well-perfused    Intake/Output from previous day: 01/20 0701 - 01/21 0700 In: 1080 [P.O.:1060; I.V.:20] Out: 878 [Urine:858; Chest Tube:20] Intake/Output this shift: No intake/output data recorded.  Lab Results:  CBC: Recent Labs  04/01/16 0414 04/02/16 0513  WBC 13.4* 12.5*  HGB 8.7* 8.3*  HCT 26.8* 26.2*  PLT 246 299    BMET:  Recent Labs  04/01/16 0414 04/02/16 0513  NA 132* 133*  K 4.3 4.0  CL 101 102  CO2 24 25  GLUCOSE 249* 125*  BUN 19 25*  CREATININE 1.19 1.22  CALCIUM 8.0* 8.1*     PT/INR:   Recent Labs  03/30/16 1420  LABPROT 17.0*  INR 1.38    CBG (last 3)   Recent Labs  04/01/16 2302 04/02/16 0408 04/02/16 0737  GLUCAP 155* 121* 101*    ABG    Component Value Date/Time   PHART 7.388 03/30/2016 1850   PCO2ART 36.4 03/30/2016 1850   PO2ART 126.0 (H) 03/30/2016 1850   HCO3 21.8 03/30/2016 1850   TCO2 24 03/31/2016 1614   ACIDBASEDEF 3.0 (H) 03/30/2016 1850   O2SAT  99.0 03/30/2016 1850    CXR: CHEST  2 VIEW  COMPARISON:  April 01, 2016  FINDINGS: The right chest tube has been removed. No pneumothorax. Increasing opacity and probable small effusion in the left base. Opacity in the left upper lobe is likely atelectasis, not significantly changed. No other interval changes.  IMPRESSION: 1. Removal of right chest tube with no pneumothorax. 2. Increased small effusion and underlying opacity in the left base. 3. Opacity in the medial left upper lobe may be atelectasis. Recommend attention on follow-up.   Electronically Signed   By: Dorise Bullion III M.D   On: 04/02/2016 08:50  Assessment/Plan: S/P Procedure(s) (LRB): CORONARY ARTERY BYPASS GRAFTING (CABG) Times Two (N/A) AORTIC VALVE REPLACEMENT (AVR) (N/A) TRANSESOPHAGEAL ECHOCARDIOGRAM (TEE) (N/A) CLIPPING OF LEFT ATRIAL APPENDAGE (N/A)  Overall stable POD3 Afib w/ controlled rate, BP stable Breathing comfortably w/ O2 sats 98% on 2 L/min via [place] Expected post op acute blood loss anemia, Hgb stable Chronic combined systolic and diastolic CHF with expected post-op volume excess, I/O's balanced yesterday and weight still 11 lbs > preop Type II diabetes mellitus, CBG's improved last 24 hrs   Continue oral amiodarone, carvedilol, Cozaar  Mobilize - needs lots of encouragement  Increase diuresis - will give lasix IV today  Continue  levemir and SSI  Continue lovenox for DVT prophylaxis - consider systemic anticoagulation for Afib but patient likely poor candidate for Coumadin and LA appendage was clipped at time of surgery  Still awaiting bed for transfer to step down  Rexene Alberts, MD 04/02/2016 9:14 AM

## 2016-04-02 NOTE — Plan of Care (Signed)
Problem: Bowel/Gastric: Goal: Gastrointestinal status for postoperative course will improve Outcome: Progressing Pt with return of bowel sounds, c/o cramping. Pt encouraged to increase PO intake to relieve cramping. Pt requesting enemas, will continue to monitor and provide emotional support.  Problem: Coping: Goal: Ability to adjust to condition or change in health will improve Outcome: Progressing Pt extremely anxious and emotional when discussing care plan, increase activity, cough pillow instructions etc. Emotional support provided, PRN Xanax ordered and in use.

## 2016-04-02 NOTE — Progress Notes (Signed)
[facility] BRIEF SICU PROGRESS NOTE  3 Days Post-Op  S/P Procedure(s) (LRB): CORONARY ARTERY BYPASS GRAFTING (CABG) Times Two (N/A) AORTIC VALVE REPLACEMENT (AVR) (N/A) TRANSESOPHAGEAL ECHOCARDIOGRAM (TEE) (N/A) CLIPPING OF LEFT ATRIAL APPENDAGE (N/A)   Stable day although patient was unable to void and found to have distended bladder on scan - Foley replaced Remains in Afib w/ controlled rate Patient now refusing to get up out of bed, although he did get up earlier in the day  Plan: Continue current plan  Rexene Alberts, MD 04/02/2016 5:23 PM

## 2016-04-02 NOTE — Progress Notes (Signed)
Pt unable to void. States he feels a lot of pressure in lower abd. Pt last void was 175ml at 0500. Bladder scan done to result 716ml present. MD called, ordered to place foley and send UA/Culture

## 2016-04-02 NOTE — Progress Notes (Signed)
RT NOTE:  RT placed patient on CPAP auto titrate for night. Pt comfortable. RT will monitor.

## 2016-04-02 NOTE — Progress Notes (Signed)
Report attempted to [place]

## 2016-04-03 DIAGNOSIS — I481 Persistent atrial fibrillation: Secondary | ICD-10-CM

## 2016-04-03 LAB — GLUCOSE, CAPILLARY
GLUCOSE-CAPILLARY: 102 mg/dL — AB (ref 65–99)
GLUCOSE-CAPILLARY: 149 mg/dL — AB (ref 65–99)
GLUCOSE-CAPILLARY: 76 mg/dL (ref 65–99)
GLUCOSE-CAPILLARY: 113 mg/dL — AB (ref 65–99)
Glucose-Capillary: 147 mg/dL — ABNORMAL HIGH (ref 65–99)

## 2016-04-03 LAB — URINE CULTURE: Culture: NO GROWTH

## 2016-04-03 MED ORDER — INSULIN ASPART 100 UNIT/ML ~~LOC~~ SOLN: 0.0000 [IU] | Freq: Every day | SUBCUTANEOUS | Status: DC

## 2016-04-03 MED ORDER — TAMSULOSIN HCL 0.4 MG PO CAPS
0.4000 mg | ORAL_CAPSULE | Freq: Every day | ORAL | Status: DC
  Administered 2016-04-03 – 2016-04-09 (×7): 0.4 mg via ORAL
  Filled 2016-04-03 (×7): qty 1

## 2016-04-03 MED ORDER — METFORMIN HCL 500 MG PO TABS
1000.0000 mg | ORAL_TABLET | Freq: Two times a day (BID) | ORAL | Status: DC
  Administered 2016-04-04: 1000 mg via ORAL
  Filled 2016-04-03: qty 2

## 2016-04-03 MED ORDER — FUROSEMIDE 40 MG PO TABS
40.0000 mg | ORAL_TABLET | Freq: Every day | ORAL | Status: DC
  Administered 2016-04-04 – 2016-04-06 (×3): 40 mg via ORAL
  Filled 2016-04-03 (×4): qty 1

## 2016-04-03 MED ORDER — INSULIN ASPART 100 UNIT/ML ~~LOC~~ SOLN
0.0000 [IU] | Freq: Three times a day (TID) | SUBCUTANEOUS | Status: DC
  Administered 2016-04-04: 3 [IU] via SUBCUTANEOUS
  Administered 2016-04-04 – 2016-04-05 (×3): 4 [IU] via SUBCUTANEOUS
  Administered 2016-04-05: 7 [IU] via SUBCUTANEOUS
  Administered 2016-04-06: 3 [IU] via SUBCUTANEOUS
  Administered 2016-04-06 (×2): 4 [IU] via SUBCUTANEOUS
  Administered 2016-04-07: 7 [IU] via SUBCUTANEOUS
  Administered 2016-04-07: 3 [IU] via SUBCUTANEOUS
  Administered 2016-04-08: 4 [IU] via SUBCUTANEOUS
  Administered 2016-04-08: 3 [IU] via SUBCUTANEOUS
  Administered 2016-04-09: 4 [IU] via SUBCUTANEOUS
  Administered 2016-04-09 – 2016-04-10 (×2): 3 [IU] via SUBCUTANEOUS
  Administered 2016-04-10: 4 [IU] via SUBCUTANEOUS

## 2016-04-03 NOTE — Progress Notes (Signed)
Patient complaining of shortness of breath and diaphoretic CBG 147  pt 93 % on room air placed on 2L 02 [place], 98% now, pt complaining of not being able to hear nursing staff at this time this is unchanged from earlier. BP 68/43, 67/45, and 81/50 respectively patient assisted to lie down in bed. Dr. Roxan Hockey on floor and updated on patient by nursing staff. Will monitor patient. Kory Rains, Bettina Gavia Rn

## 2016-04-03 NOTE — Care Management Important Message (Signed)
Important Message  Patient Details  Name: Mark Cabrera MRN: [id number] Date of Birth: 04-Feb-1951   Medicare Important Message Given:  Yes    [name] May 04/03/2016, 1:42 PM

## 2016-04-03 NOTE — Procedures (Signed)
Placed patient on BIPAP auto for the night. Patient is tolerating well at this time.

## 2016-04-03 NOTE — Progress Notes (Addendum)
      [address]       [place], [address]             [phone]      4 Days Post-Op Procedure(s) (LRB): CORONARY ARTERY BYPASS GRAFTING (CABG) Times Two (N/A) AORTIC VALVE REPLACEMENT (AVR) (N/A) TRANSESOPHAGEAL ECHOCARDIOGRAM (TEE) (N/A) CLIPPING OF LEFT ATRIAL APPENDAGE (N/A)   Subjective:  Complains of breakfast being late.  Not being ambulated.  Feels like he is being ignored down here.  He denies chest pain, shortness of breath.  Objective: Vital signs in last 24 hours: Temp:  [97.4 F (36.3 C)-98.4 F (36.9 C)] 98.4 F (36.9 C) (01/22 0410) Pulse Rate:  [73-96] 86 (01/22 0410) Cardiac Rhythm: Atrial fibrillation (01/21 2254) Resp:  [12-25] 20 (01/22 0410) BP: (95-140)/(65-86) 114/78 (01/22 0410) SpO2:  [91 %-99 %] 98 % (01/22 0410) Weight:  [237 lb 8 oz (107.7 kg)-237 lb 9.6 oz (107.8 kg)] 237 lb 9.6 oz (107.8 kg) (01/22 0410)  Intake/Output from previous day: 01/21 0701 - 01/22 0700 In: 1780 [P.O.:1780] Out: 1900 [Urine:1900]  General appearance: alert, cooperative and no distress Heart: regular rate and rhythm Lungs: clear to auscultation bilaterally Abdomen: soft, non-tender; bowel sounds normal; no masses,  no organomegaly Extremities: edema 1+ Wound: clean and dry  Lab Results:  Recent Labs  04/01/16 0414 04/02/16 0513  WBC 13.4* 12.5*  HGB 8.7* 8.3*  HCT 26.8* 26.2*  PLT 246 299   BMET:  Recent Labs  04/01/16 0414 04/02/16 0513  NA 132* 133*  K 4.3 4.0  CL 101 102  CO2 24 25  GLUCOSE 249* 125*  BUN 19 25*  CREATININE 1.19 1.22  CALCIUM 8.0* 8.1*    PT/INR: No results for input(s): LABPROT, INR in the last 72 hours. ABG    Component Value Date/Time   PHART 7.388 03/30/2016 1850   HCO3 21.8 03/30/2016 1850   TCO2 24 03/31/2016 1614   ACIDBASEDEF 3.0 (H) 03/30/2016 1850   O2SAT 99.0 03/30/2016 1850   CBG (last 3)   Recent Labs  04/02/16 2032 04/02/16 2327 04/03/16 0353  GLUCAP 200* 161* 102*     Assessment/Plan: S/P Procedure(s) (LRB): CORONARY ARTERY BYPASS GRAFTING (CABG) Times Two (N/A) AORTIC VALVE REPLACEMENT (AVR) (N/A) TRANSESOPHAGEAL ECHOCARDIOGRAM (TEE) (N/A) CLIPPING OF LEFT ATRIAL APPENDAGE (N/A)  1. CV- PAF, currently NSR, bradycardic at times- on Coreg, Amiodarone, Cozaar 2. Pulm- no acute issues, continue IS 3. Renal- creatinine was up to 1.22, + hypervolemia- will continue Lasix, Repeat BMET in AM 4. Gu- Urinary retention, foley in place will start flomax 5. DM- sugars controlled, will continue to transition of insulin, restart home Metformin 6. Dispo- patient with PAF, currently at NSR, repeat BMET in AM as creatinine was risining, continue foley for now start flomax   LOS: 4 days    BARRETT, ERIN 04/03/2016 CTSP for hypotension He was feeling poorly when up. BP was low. Better after placed back in bed Says he feels better now but my voice "sounds squeaky and high" Back in SR I have serious concerns about long term anticoagulation for him based on social situation. He is back in SR currently, will hold off unless he has recurrent atrial fib.  Revonda Standard Roxan Hockey, MD [facility] [phone]

## 2016-04-03 NOTE — Progress Notes (Signed)
CARDIAC REHAB PHASE I   PRE:  Rate/Rhythm: 76 a fib  BP:  Sitting: 98/58        SaO2: 97 RA  MODE:  Ambulation: 150 ft   POST:  Rate/Rhythm: 92 a fib  BP:  Sitting: 126/56         SaO2: 97 RA  Pt required assistance from lying to sitting position, stood with minimal assistance, however, he did have significant posterior lean upon standing, loss of balance x3. Pt states he has significant neuropathy and has trouble moving his right foot, improved with activity. Pt ambulated 150 ft on RA, rolling walker, gait belt, assist x2, fairly steady gait, tolerated fairly well, standing rest x1, followed with wheelchair, however, pt did not need to sit. Pt c/o DOE, generalized weakness. Pt would benefit from PT consult for increased mobility with R side deficits. Encouraged additional ambulation x2 today, IS. Pt to recliner after walk, call bell within reach. Will follow as x2.   [id number] Lenna Sciara, RN, BSN 04/03/2016 11:57 AM

## 2016-04-03 NOTE — Progress Notes (Signed)
Progress Note  Patient Name: Mark Cabrera Date of Encounter: 04/03/2016  Primary Cardiologist: [name]  Subjective   Feels weak. Notes residual leg weakness from prior CVA.  Inpatient Medications    Scheduled Meds: . acetaminophen  1,000 mg Oral Q6H  . amiodarone  400 mg Oral BID PC  . aspirin EC  325 mg Oral Daily  . atorvastatin  80 mg Oral q1800  . bisacodyl  10 mg Oral Daily   Or  . bisacodyl  10 mg Rectal Daily  . carvedilol  6.25 mg Oral BID WC  . chlorhexidine gluconate (MEDLINE KIT)  15 mL Mouth Rinse BID  . Chlorhexidine Gluconate Cloth  6 each Topical Daily  . docusate sodium  200 mg Oral Daily  . enoxaparin (LOVENOX) injection  40 mg Subcutaneous QHS  . furosemide  40 mg Oral BID  . gabapentin  100 mg Oral Daily  . glipiZIDE  10 mg Oral QAC breakfast  . insulin aspart  0-24 Units Subcutaneous Q4H  . losartan  50 mg Oral Daily  . mouth rinse  15 mL Mouth Rinse BID  . metFORMIN  1,000 mg Oral BID WC  . mupirocin ointment  1 application Nasal BID  . pantoprazole  40 mg Oral Daily  . potassium chloride  20 mEq Oral BID  . sodium chloride flush  3 mL Intravenous Q12H  . sodium chloride flush  3 mL Intravenous Q12H  . tamsulosin  0.4 mg Oral QPC supper   Continuous Infusions: . sodium chloride Stopped (03/31/16 0600)   PRN Meds: sodium chloride, ALPRAZolam, metoprolol, morphine injection, ondansetron (ZOFRAN) IV, oxyCODONE, sodium chloride flush, sodium chloride flush, traMADol   Vital Signs    Vitals:   04/02/16 2034 04/02/16 2218 04/02/16 2247 04/03/16 0410  BP:  128/82  114/78  Pulse:  96 94 86  Resp:  '20 18 20  ' Temp: 97.4 F (36.3 C) 98.3 F (36.8 C)  98.4 F (36.9 C)  TempSrc: Oral Oral  Oral  SpO2:  97% 97% 98%  Weight:  237 lb 8 oz (107.7 kg)  237 lb 9.6 oz (107.8 kg)  Height:  '5\' 8"'  (1.727 m)      Intake/Output Summary (Last 24 hours) at 04/03/16 1134 Last data filed at 04/03/16 0600  Gross per 24 hour  Intake             1280 ml    Output             1900 ml  Net             -620 ml   Filed Weights   04/02/16 0611 04/02/16 2218 04/03/16 0410  Weight: 231 lb 14.8 oz (105.2 kg) 237 lb 8 oz (107.7 kg) 237 lb 9.6 oz (107.8 kg)    Telemetry    Afib with controlled VR- Personally Reviewed  ECG    pending - Personally Reviewed  Physical Exam    GEN: No acute distress.  Neck: No JVD Cardiac: iRRR, no murmurs, rubs, or gallops.  Respiratory: Clear to auscultation bilaterally. GI: Soft, nontender, non-distended  MS: No edema; No deformity. Neuro:  AAOx3. Psych: Normal affect  Labs    Chemistry Recent Labs Lab 03/31/16 0400 03/31/16 1614 03/31/16 1621 04/01/16 0414 04/02/16 0513  NA 135 134*  --  132* 133*  K 4.2 4.3  --  4.3 4.0  CL 104 99*  --  101 102  CO2 21*  --   --  24  25  GLUCOSE 137* 184*  --  249* 125*  BUN 13 15  --  19 25*  CREATININE 1.07 1.10 1.10 1.19 1.22  CALCIUM 7.8*  --   --  8.0* 8.1*  GFRNONAA >60  --  >60 >60 60*  GFRAA >60  --  >60 >60 >60  ANIONGAP 10  --   --  7 6     Hematology Recent Labs Lab 03/31/16 1621 04/01/16 0414 04/02/16 0513  WBC 12.0* 13.4* 12.5*  RBC 3.05* 3.15* 3.08*  HGB 8.6* 8.7* 8.3*  HCT 25.9* 26.8* 26.2*  MCV 84.9 85.1 85.1  MCH 28.2 27.6 26.9  MCHC 33.2 32.5 31.7  RDW 13.8 13.9 13.7  PLT 192 246 299    Cardiac EnzymesNo results for input(s): TROPONINI in the last 168 hours. No results for input(s): TROPIPOC in the last 168 hours.   BNPNo results for input(s): BNP, PROBNP in the last 168 hours.   DDimer No results for input(s): DDIMER in the last 168 hours.   Radiology    Dg Chest 2 View  Result Date: 04/02/2016 CLINICAL DATA:  Atelectasis EXAM: CHEST  2 VIEW COMPARISON:  April 01, 2016 FINDINGS: The right chest tube has been removed. No pneumothorax. Increasing opacity and probable small effusion in the left base. Opacity in the left upper lobe is likely atelectasis, not significantly changed. No other interval changes.  IMPRESSION: 1. Removal of right chest tube with no pneumothorax. 2. Increased small effusion and underlying opacity in the left base. 3. Opacity in the medial left upper lobe may be atelectasis. Recommend attention on follow-up. Electronically Signed   By: Dorise Bullion III M.D   On: 04/02/2016 08:50    Cardiac Studies   Echo: 02/16/16: Study Conclusions  - Left ventricle: The cavity size was normal. Wall thickness was   increased in a pattern of moderate LVH. Systolic function was   normal. The estimated ejection fraction was in the range of 55%   to 60%. Wall motion was normal; there were no regional wall   motion abnormalities. Features are consistent with a pseudonormal   left ventricular filling pattern, with concomitant abnormal   relaxation and increased filling pressure (grade 2 diastolic   dysfunction). - Aortic valve: Trileaflet; severely calcified leaflets. There was   severe stenosis. There was trivial regurgitation. Mean gradient   (S): 47 mm Hg. Peak gradient (S): 78 mm Hg. Valve area (VTI):   0.87 cm^2. - Mitral valve: There was mild regurgitation. - Left atrium: The atrium was moderately dilated. - Right ventricle: The cavity size was normal. Systolic function   was normal. - Right atrium: The atrium was mildly dilated. - Pulmonary arteries: No complete TR doppler jet so unable to   estimate PA systolic pressure. - Inferior vena cava: The vessel was normal in size. The   respirophasic diameter changes were in the normal range (>= 50%),   consistent with normal central venous pressure.  Impressions:  - Normal LV size with moderate LV hypertrophy. EF 55-60%. Moderate   diastolic dysfunction. Normal RV size and systolic function.   Severe aortic stenosis. Mild mitral regurgitation.  Cardiac cath 03/15/16: Conclusion     There is severe aortic valve stenosis.  Mid RCA lesion, 20 %stenosed.  Dist RCA lesion, 95 %stenosed.  Ost LM lesion, 20  %stenosed.  Prox Cx lesion, 50 %stenosed.  Mid LAD lesion, 40 %stenosed.  Hemodynamic findings consistent with moderate pulmonary hypertension.   1. Severe stenosis distal RCA  2. Moderate stenosis proximal Circumflex and mid LAD 3. Severe aortic stenosis (peak to peak gradient 58 mmHg, mean 38 mmHg, AVA 0.75cm2) 4. Elevated filling pressures.   Recommendations: Will admit for diuresis. Filling pressures elevated. Pt now dyspneic at rest. Will ask CT surgery to see him to discuss planning for CABG and AVR. He will likely need several days of diuresis with IV Lasix.      Patient Profile     66 y.o. male admitted with progressive severe AS. S/p AVR with [facility] Ease bovine pericardial valve, CABG x 2 with SVG to OM and SVG to PDA on 03/31/16. Was seen 1/19 for ? Afib but was in NSR at that time with PACs. Now in persistent Afib.  Assessment & Plan    1. S/p AVR/CABG on 03/31/16.  2. Afib post op. Controlled rate on monitor- will check Ecg to document. On oral amiodarone. [place] vasc score of 5. Would consider anticoagulation when felt to be safe from a surgical standpoint. If afib persists would consider DCCV in 6-8 weeks. 3. HTN 4. History of CVA 5. Hyperlipidemia.  Signed, Ethelean Colla [place], MD  04/03/2016, 11:34 AM

## 2016-04-04 DIAGNOSIS — I480 Paroxysmal atrial fibrillation: Secondary | ICD-10-CM

## 2016-04-04 LAB — BASIC METABOLIC PANEL
Anion gap: 11 (ref 5–15)
BUN: 47 mg/dL — AB (ref 6–20)
CALCIUM: 7.9 mg/dL — AB (ref 8.9–10.3)
CO2: 20 mmol/L — ABNORMAL LOW (ref 22–32)
Chloride: 98 mmol/L — ABNORMAL LOW (ref 101–111)
Creatinine, Ser: 1.78 mg/dL — ABNORMAL HIGH (ref 0.61–1.24)
GFR calc Af Amer: 44 mL/min — ABNORMAL LOW (ref >60)
GFR, EST NON AFRICAN AMERICAN: 38 mL/min — AB (ref >60)
GLUCOSE: 140 mg/dL — AB (ref 65–99)
Potassium: 3.8 mmol/L (ref 3.5–5.1)
SODIUM: 129 mmol/L — AB (ref 135–145)

## 2016-04-04 LAB — GLUCOSE, CAPILLARY
GLUCOSE-CAPILLARY: 167 mg/dL — AB (ref 65–99)
Glucose-Capillary: 126 mg/dL — ABNORMAL HIGH (ref 65–99)
Glucose-Capillary: 194 mg/dL — ABNORMAL HIGH (ref 65–99)
Glucose-Capillary: 114 mg/dL — ABNORMAL HIGH (ref 65–99)

## 2016-04-04 NOTE — Evaluation (Signed)
Physical Therapy Evaluation Patient Details Name: Mark Cabrera MRN: [id number] DOB: Feb 05, 1951 Today's Date: 04/04/2016   History of Present Illness  66 yo s/p CABGx2. 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 Pt with decreased gait, balance, activity tolerance and cognition who will benefit from acute therapy to maximize mobility, function, independence and adherence to precautions prior to D/C. Pt reports he will stay with brother with 24 hr assist at D/C. Pt educated for precautions, transfers, gait and plan.   HR 68-77 BP 114/59 pre 123/71 post sats 97-99% RA throughout    Follow Up Recommendations Home health PT;Supervision for mobility/OOB    Equipment Recommendations  Rolling walker with 5" wheels;3in1 (PT)    Recommendations for Other Services OT consult     Precautions / Restrictions Precautions Precautions: Sternal;Fall      Mobility  Bed Mobility Overal bed mobility: Needs Assistance Bed Mobility: Rolling;Sidelying to Sit Rolling: Min assist Sidelying to sit: Min assist       General bed mobility comments: cues for sequence and precautions, assist to rotate trunk and rise from surface  Transfers Overall transfer level: Needs assistance   Transfers: Sit to/from Stand Sit to Stand: Min guard         General transfer comment: cues for hand placement, safety and assist to steady once standing x 3 trials  Ambulation/Gait Ambulation/Gait assistance: Min assist Ambulation Distance (Feet): 160 Feet limited by need for toileting pre and post ambulation Assistive device: Rolling walker (2 wheeled) Gait Pattern/deviations: Step-through pattern;Decreased stride length;Trunk flexed   Gait velocity interpretation: Below normal speed for age/gender General  Gait Details: cues for posture and position in RW, sway x 3 with guarding for balance and stability, no rests during gait  Stairs            Wheelchair Mobility    Modified Rankin (Stroke Patients Only)       Balance Overall balance assessment: Needs assistance   Sitting balance-Leahy Scale: Good       Standing balance-Leahy Scale: Poor                               Pertinent Vitals/Pain Pain Assessment: 0-10 Pain Score: 3  Pain Location: sternal Pain Descriptors / Indicators: Sore;Operative site guarding Pain Intervention(s): Limited activity within patient's tolerance;Repositioned;Monitored during session    Home Living Family/patient expects to be discharged to:: Private residence Living Arrangements: Alone Available Help at Discharge: Family;Available 24 hours/day Type of Home: House Home Access: Stairs to enter   [employer] of Steps: 1 Home Layout: One level Home Equipment: None      Prior Function Level of Independence: Independent               Hand Dominance        Extremity/Trunk Assessment   Upper Extremity Assessment Upper Extremity Assessment: Overall WFL for tasks assessed    Lower Extremity Assessment Lower Extremity Assessment: Overall WFL for tasks assessed    Cervical / Trunk Assessment Cervical / Trunk Assessment: Normal  Communication   Communication: No difficulties  Cognition Arousal/Alertness: Awake/alert Behavior During Therapy: Flat affect Overall Cognitive Status: Impaired/Different from baseline Area of Impairment: Orientation;Memory Orientation Level: Time   Memory: Decreased recall of precautions  General Comments      Exercises     Assessment/Plan    PT Assessment Patient needs continued PT services  PT Problem List Decreased strength;Decreased mobility;Decreased safety awareness;Decreased activity tolerance;Decreased cognition;Decreased knowledge of  precautions;Decreased balance;Decreased knowledge of use of DME;Pain          PT Treatment Interventions Gait training;Therapeutic exercise;Patient/family education;DME instruction;Therapeutic activities;Cognitive remediation;Stair training;Balance training;Functional mobility training    PT Goals (Current goals can be found in the Care Plan section)  Acute Rehab PT Goals Patient Stated Goal: return to my house PT Goal Formulation: With patient Time For Goal Achievement: 04/18/16 Potential to Achieve Goals: Good    Frequency Min 3X/week   Barriers to discharge        Co-evaluation               End of Session Equipment Utilized During Treatment: Gait belt Activity Tolerance: Patient tolerated treatment well Patient left: in chair;with call bell/phone within reach;with chair alarm set;Other (comment) (telesitter) Nurse Communication: Mobility status;Precautions         Time: [phone] PT Time Calculation (min) (ACUTE ONLY): 44 min   Charges:   PT Evaluation $PT Eval Moderate Complexity: 1 Procedure PT Treatments $Gait Training: 8-22 mins $Therapeutic Activity: 8-22 mins   PT G Codes:        Talyssa Gibas B Leondre Taul 04/06/2016, 8:23 AM  Elwyn Reach, [place]

## 2016-04-04 NOTE — Progress Notes (Signed)
CARDIAC REHAB PHASE I   PRE:  Rate/Rhythm: 61 SR  BP:  Sitting: 82/48        SaO2: 99 RA  MODE:  Ambulation: 150 ft   POST:  Rate/Rhythm: 71 SR  BP:  Sitting: 93/48         SaO2: 100 RA  Pt in chair, states he is sleepy, agreeable to walk.  Pt had loss of balance initially when standing, assisted pt to bedside commode. BP low. Pt ambulated 150 ft on RA, rolling walker, foley catheter, gait belt, assist x2, moderately unsteady gait, although gait improved with ambulation, tolerated fair. Pt c/o dizziness, fatigue, shortness of breath, frequent grunting/moaning, occasional loss of balance, standing rest x2 (posterior and forward lean when standing still). Pt impulsive, poor safety awareness. Pt to bed per pt request after walk, bed alarm on, call bell within reach. Will continue to follow as assist x2.  [id number] Lenna Sciara, RN, BSN 04/04/2016 1:49 PM

## 2016-04-04 NOTE — Progress Notes (Addendum)
[address]       [place] [address]             [phone]      5 Days Post-Op Procedure(s) (LRB): CORONARY ARTERY BYPASS GRAFTING (CABG) Times Two (N/A) AORTIC VALVE REPLACEMENT (AVR) (N/A) TRANSESOPHAGEAL ECHOCARDIOGRAM (TEE) (N/A) CLIPPING OF LEFT ATRIAL APPENDAGE (N/A) Subjective: Feels okay this morning. Shares that his bottom is numb and he would like to be repositioned.   Objective: Vital signs in last 24 hours: Temp:  [97.2 F (36.2 C)-97.9 F (36.6 C)] 97.9 F (36.6 C) (01/23 0543) Pulse Rate:  [65-69] 68 (01/23 0747) Cardiac Rhythm: Normal sinus rhythm (01/23 0700) Resp:  [18-21] 20 (01/23 0543) BP: (60-126)/(43-77) 114/54 (01/23 0747) SpO2:  [95 %-100 %] 97 % (01/23 0747) Weight:  [236 lb 4.8 oz (107.2 kg)] 236 lb 4.8 oz (107.2 kg) (01/23 0500)     Intake/Output from previous day: 01/22 0701 - 01/23 0700 In: 870 [P.O.:870] Out: 1000 [Urine:1000] Intake/Output this shift: No intake/output data recorded.  General appearance: alert, cooperative and no distress Heart: regular rate and rhythm, S1, S2 normal, no murmur, click, rub or gallop Lungs: clear to auscultation bilaterally Abdomen: soft, non-tender; bowel sounds normal; no masses,  no organomegaly Extremities: edema 1-2+ nonpitting edema Wound: clean and dry  Lab Results:  Recent Labs  04/02/16 0513  WBC 12.5*  HGB 8.3*  HCT 26.2*  PLT 299   BMET:  Recent Labs  04/02/16 0513 04/04/16 0333  NA 133* 129*  K 4.0 3.8  CL 102 98*  CO2 25 20*  GLUCOSE 125* 140*  BUN 25* 47*  CREATININE 1.22 1.78*  CALCIUM 8.1* 7.9*    PT/INR: No results for input(s): LABPROT, INR in the last 72 hours. ABG    Component Value Date/Time   PHART 7.388 03/30/2016 1850   HCO3 21.8 03/30/2016 1850   TCO2 24 03/31/2016 1614   ACIDBASEDEF 3.0 (H) 03/30/2016 1850   O2SAT 99.0 03/30/2016 1850   CBG (last 3)   Recent Labs  04/03/16 1612 04/03/16 2029 04/04/16 0600  GLUCAP 76  113* 167*    Assessment/Plan: S/P Procedure(s) (LRB): CORONARY ARTERY BYPASS GRAFTING (CABG) Times Two (N/A) AORTIC VALVE REPLACEMENT (AVR) (N/A) TRANSESOPHAGEAL ECHOCARDIOGRAM (TEE) (N/A) CLIPPING OF LEFT ATRIAL APPENDAGE (N/A)  1. CV- previous atrial fibrillation, currently NSR in the 60s. Once episode of NSVT last night. Taking Coreg, Amio, and Cozaar. BP well controlled. 2. Pulm-tolerating room air. He has been using his Is. He felt short of breath this morning but after some repositioning he felt a little better.  3. Renal-creatinine increased to 1.78. He has a foley catheter in place due to retaining, which has happened before. Remains fluid overloaded. Good urine output. Weight is trending down.  4. GU-urinary retention. On Flomax. Foley catheter in place.  5. DM-blood sugar well controlled. On home metformin.  6. Right upper extremity numbness and swelling. Left upper extremity with some numbess and swelling as well. Keep both arms elevated. Will order a heating pad for right upper extremity. Some residual symptoms from his previous stroke before his CABG. Has become slightly worse since surgery.  7. Plan: Continue foley catheter and  flomax. He had retention issues before after his stroke prior to surgery where he had to keep the foley catheter in for 4 weeks. May need to scale back Lasix since creatinine is rising.    LOS: 5 days    Elgie Collard 04/04/2016  Patient seen and examined, agree with above Unclear why his creatinine is elevated today. ?  Whether related to urinary retention the other day. Will follow  Revonda Standard. Roxan Hockey, MD [facility] [phone]

## 2016-04-04 NOTE — Progress Notes (Signed)
Called into pt room by PA as pt complaining of SOB. Pt O2 level 98% on room air. Administered medications. Pt described being SOB again - O2 level 96% on room air. Pt educated on deep breathing and relaxation techniques. Call light within reach, will continue to monitor.   Fritz Pickerel, RN

## 2016-04-04 NOTE — Procedures (Signed)
Placed patient on  BIPAP auto for the night.  Patient is tolerating well at this time.

## 2016-04-04 NOTE — Progress Notes (Signed)
Progress Note  Patient Name: Mark Cabrera Date of Encounter: 04/04/2016  Primary Cardiologist: [name]  Subjective   Feeling stronger. Reports ambulating in halls. No dyspnea.  Inpatient Medications    Scheduled Meds: . acetaminophen  1,000 mg Oral Q6H  . amiodarone  400 mg Oral BID PC  . aspirin EC  325 mg Oral Daily  . atorvastatin  80 mg Oral q1800  . bisacodyl  10 mg Oral Daily   Or  . bisacodyl  10 mg Rectal Daily  . carvedilol  6.25 mg Oral BID WC  . chlorhexidine gluconate (MEDLINE KIT)  15 mL Mouth Rinse BID  . Chlorhexidine Gluconate Cloth  6 each Topical Daily  . docusate sodium  200 mg Oral Daily  . enoxaparin (LOVENOX) injection  40 mg Subcutaneous QHS  . furosemide  40 mg Oral Daily  . gabapentin  100 mg Oral Daily  . glipiZIDE  10 mg Oral QAC breakfast  . insulin aspart  0-20 Units Subcutaneous TID WC  . insulin aspart  0-5 Units Subcutaneous QHS  . losartan  50 mg Oral Daily  . mouth rinse  15 mL Mouth Rinse BID  . mupirocin ointment  1 application Nasal BID  . pantoprazole  40 mg Oral Daily  . potassium chloride  20 mEq Oral BID  . sodium chloride flush  3 mL Intravenous Q12H  . sodium chloride flush  3 mL Intravenous Q12H  . tamsulosin  0.4 mg Oral QPC supper   Continuous Infusions: . sodium chloride Stopped (03/31/16 0600)   PRN Meds: sodium chloride, ALPRAZolam, metoprolol, morphine injection, ondansetron (ZOFRAN) IV, oxyCODONE, sodium chloride flush, sodium chloride flush, traMADol   Vital Signs    Vitals:   04/04/16 0530 04/04/16 0543 04/04/16 0602 04/04/16 0747  BP: 107/60 110/63 109/65 (!) 114/54  Pulse:  69  68  Resp:  20    Temp:  97.9 F (36.6 C)    TempSrc:  Oral    SpO2:  100%  97%  Weight:      Height:        Intake/Output Summary (Last 24 hours) at 04/04/16 1224 Last data filed at 04/04/16 0500  Gross per 24 hour  Intake              870 ml  Output             1000 ml  Net             -130 ml   Filed Weights   04/02/16 2218 04/03/16 0410 04/04/16 0500  Weight: 237 lb 8 oz (107.7 kg) 237 lb 9.6 oz (107.8 kg) 236 lb 4.8 oz (107.2 kg)    Telemetry    NSR Personally Reviewed  ECG    NSR- Personally Reviewed  Physical Exam    GEN: No acute distress.  Neck: No JVD Cardiac: iRRR, no murmurs, rubs, or gallops.  Respiratory: Clear to auscultation bilaterally. GI: Soft, nontender, non-distended  MS: No edema; No deformity. Neuro:  AAOx3. Psych: Normal affect  Labs    Chemistry  Recent Labs Lab 04/01/16 0414 04/02/16 0513 04/04/16 0333  NA 132* 133* 129*  K 4.3 4.0 3.8  CL 101 102 98*  CO2 24 25 20*  GLUCOSE 249* 125* 140*  BUN 19 25* 47*  CREATININE 1.19 1.22 1.78*  CALCIUM 8.0* 8.1* 7.9*  GFRNONAA >60 60* 38*  GFRAA >60 >60 44*  ANIONGAP _0 Hematology  Recent Labs  Lab 03/31/16 1621 04/01/16 0414 04/02/16 0513  WBC 12.0* 13.4* 12.5*  RBC 3.05* 3.15* 3.08*  HGB 8.6* 8.7* 8.3*  HCT 25.9* 26.8* 26.2*  MCV 84.9 85.1 85.1  MCH 28.2 27.6 26.9  MCHC 33.2 32.5 31.7  RDW 13.8 13.9 13.7  PLT 192 246 299    Cardiac EnzymesNo results for input(s): TROPONINI in the last 168 hours. No results for input(s): TROPIPOC in the last 168 hours.   BNPNo results for input(s): BNP, PROBNP in the last 168 hours.   DDimer No results for input(s): DDIMER in the last 168 hours.   Radiology    No results found.  Cardiac Studies   Echo: 02/16/16: Study Conclusions  - Left ventricle: The cavity size was normal. Wall thickness was   increased in a pattern of moderate LVH. Systolic function was   normal. The estimated ejection fraction was in the range of 55%   to 60%. Wall motion was normal; there were no regional wall   motion abnormalities. Features are consistent with a pseudonormal   left ventricular filling pattern, with concomitant abnormal   relaxation and increased filling pressure (grade 2 diastolic   dysfunction). - Aortic valve: Trileaflet; severely calcified  leaflets. There was   severe stenosis. There was trivial regurgitation. Mean gradient   (S): 47 mm Hg. Peak gradient (S): 78 mm Hg. Valve area (VTI):   0.87 cm^2. - Mitral valve: There was mild regurgitation. - Left atrium: The atrium was moderately dilated. - Right ventricle: The cavity size was normal. Systolic function   was normal. - Right atrium: The atrium was mildly dilated. - Pulmonary arteries: No complete TR doppler jet so unable to   estimate PA systolic pressure. - Inferior vena cava: The vessel was normal in size. The   respirophasic diameter changes were in the normal range (>= 50%),   consistent with normal central venous pressure.  Impressions:  - Normal LV size with moderate LV hypertrophy. EF 55-60%. Moderate   diastolic dysfunction. Normal RV size and systolic function.   Severe aortic stenosis. Mild mitral regurgitation.  Cardiac cath 03/15/16: Conclusion     There is severe aortic valve stenosis.  Mid RCA lesion, 20 %stenosed.  Dist RCA lesion, 95 %stenosed.  Ost LM lesion, 20 %stenosed.  Prox Cx lesion, 50 %stenosed.  Mid LAD lesion, 40 %stenosed.  Hemodynamic findings consistent with moderate pulmonary hypertension.   1. Severe stenosis distal RCA 2. Moderate stenosis proximal Circumflex and mid LAD 3. Severe aortic stenosis (peak to peak gradient 58 mmHg, mean 38 mmHg, AVA 0.75cm2) 4. Elevated filling pressures.   Recommendations: Will admit for diuresis. Filling pressures elevated. Pt now dyspneic at rest. Will ask CT surgery to see him to discuss planning for CABG and AVR. He will likely need several days of diuresis with IV Lasix.      Patient Profile     66 y.o. male admitted with progressive severe AS. S/p AVR with [facility] Ease bovine pericardial valve, CABG x 2 with SVG to OM and SVG to PDA on 03/31/16. Was seen 1/19 for ? Afib but was in NSR at that time with PACs. Over weekend was in  persistent Afib.  Assessment & Plan      1. S/p AVR/CABG on 03/31/16.  2. Afib post op. Now converted to NSR.On oral amiodarone. [place] vasc score of 5. Hopefully now that back in NSR can avoid anticoagulation for now.  3. HTN 4. History of CVA 5. Hyperlipidemia.  Patient  stable from a cardiac standpoint. Further plans per CT surgery. Will need follow up with Dr. [name] post DC.  Signed, Peter Martinique, MD  04/04/2016, 12:24 PM

## 2016-04-05 ENCOUNTER — Inpatient Hospital Stay (HOSPITAL_COMMUNITY): Payer: Medicare Other

## 2016-04-05 LAB — BASIC METABOLIC PANEL
ANION GAP: 11 (ref 5–15)
BUN: 54 mg/dL — ABNORMAL HIGH (ref 6–20)
CHLORIDE: 98 mmol/L — AB (ref 101–111)
CO2: 20 mmol/L — AB (ref 22–32)
CREATININE: 1.92 mg/dL — AB (ref 0.61–1.24)
Calcium: 8.1 mg/dL — ABNORMAL LOW (ref 8.9–10.3)
GFR calc non Af Amer: 34 mL/min — ABNORMAL LOW (ref >60)
GFR, EST AFRICAN AMERICAN: 40 mL/min — AB (ref >60)
Glucose, Bld: 97 mg/dL (ref 65–99)
POTASSIUM: 4.1 mmol/L (ref 3.5–5.1)
Sodium: 129 mmol/L — ABNORMAL LOW (ref 135–145)

## 2016-04-05 LAB — TYPE AND SCREEN
ABO/RH(D): A NEG
ANTIBODY SCREEN: NEGATIVE
UNIT DIVISION: 0
UNIT DIVISION: 0
Unit division: 0
Unit division: 0

## 2016-04-05 LAB — ECHO TEE
AO mean calculated velocity dopler: 275 cm/s
AOPV: 0.3 m/s
AOVTI: 76.3 cm
AV Area VTI index: 0.39 cm2/m2
AV peak Index: 0.49
AVAREAMEANV: 0.91 cm2
AVAREAMEANVIN: 0.43 cm2/m2
AVAREAVTI: 1.03 cm2
AVCELMEANRAT: 0.26
AVG: 35 mmHg
AVPG: 59 mmHg
AVPKVEL: 383 cm/s
CHL CUP AV VEL: 0.82
LVOT VTI: 18 cm
LVOT area: 3.46 cm2
LVOT diameter: 21 mm
LVOT peak grad rest: 5 mmHg
LVOT peak vel: 114 cm/s
LVOTSV: 62 mL
LVOTVTI: 0.24 cm
Valve area index: 0.39
Valve area: 0.82 cm2

## 2016-04-05 LAB — GLUCOSE, CAPILLARY
GLUCOSE-CAPILLARY: 155 mg/dL — AB (ref 65–99)
GLUCOSE-CAPILLARY: 177 mg/dL — AB (ref 65–99)
Glucose-Capillary: 104 mg/dL — ABNORMAL HIGH (ref 65–99)
Glucose-Capillary: 210 mg/dL — ABNORMAL HIGH (ref 65–99)

## 2016-04-05 LAB — CBC
HEMATOCRIT: 23.4 % — AB (ref 39.0–52.0)
HEMOGLOBIN: 7.6 g/dL — AB (ref 13.0–17.0)
MCH: 27 pg (ref 26.0–34.0)
MCHC: 32.5 g/dL (ref 30.0–36.0)
MCV: 83 fL (ref 78.0–100.0)
Platelets: 379 10*3/uL (ref 150–400)
RBC: 2.82 MIL/uL — ABNORMAL LOW (ref 4.22–5.81)
RDW: 14 % (ref 11.5–15.5)
WBC: 9 10*3/uL (ref 4.0–10.5)

## 2016-04-05 MED ORDER — CARVEDILOL 3.125 MG PO TABS
3.1250 mg | ORAL_TABLET | Freq: Two times a day (BID) | ORAL | Status: DC
  Administered 2016-04-05 – 2016-04-10 (×10): 3.125 mg via ORAL
  Filled 2016-04-05 (×10): qty 1

## 2016-04-05 NOTE — Progress Notes (Addendum)
Patient called out and c/o SOB. Patient's O2 sats were 100% on 2L. Patient was repositioned and patient felt much better. Patient also stated that, "he could barely hear our voices". Will continue to monitor.

## 2016-04-05 NOTE — Progress Notes (Signed)
Pt continues to complain of SOB. Pt taking short shallow breaths - encouraged deep breathing and relaxation. Pt O2 99% on room air. Given PRN xanax for anxiety. Call bell within reach, will continue to monitor.   Fritz Pickerel, RN

## 2016-04-05 NOTE — Progress Notes (Signed)
Pt complaining of SOB and "needs oxygen". Pt 98-99% on room air. Educated patient that his oxygen saturation levels are excellent, encourage cough & deep breathing, encouraged IS use. Pt verbalized understanding.   Fritz Pickerel, RN

## 2016-04-05 NOTE — Progress Notes (Signed)
CARDIAC REHAB PHASE I   PRE:  Rate/Rhythm: 67 1 Hb NSR  BP:  Supine:   Sitting: 108/71  Standing:    SaO2: 99% 2.5L, 97%RA  MODE:  Ambulation: 150 ft   POST:  Rate/Rhythm: 73 SR 1HB  BP:  Supine:   Sitting: 134/65  Standing:    SaO2: 96-98%RA 1030-1100 Pt walked 150 ft on RA with gait belt use, rolling walker and asst x2. Gait fairly steady. Rocked to stand and did better standing than on previous visits. To recliner with chair alarm after walk. Left off oxygen since sats good for [place] on RA. Remained in NSR.  Did not feel he could go farther in distance.   Graylon Good, RN BSN  04/05/2016 10:56 AM

## 2016-04-05 NOTE — Progress Notes (Addendum)
[address]       ,[place] [address]             [phone]      6 Days Post-Op Procedure(s) (LRB): CORONARY ARTERY BYPASS GRAFTING (CABG) Times Two (N/A) AORTIC VALVE REPLACEMENT (AVR) (N/A) TRANSESOPHAGEAL ECHOCARDIOGRAM (TEE) (N/A) CLIPPING OF LEFT ATRIAL APPENDAGE (N/A) Subjective: Shares that the heating pad worked well yesterday on his right arm. No other issues overnight.   Objective: Vital signs in last 24 hours: Temp:  [97.6 F (36.4 C)-98.1 F (36.7 C)] 98.1 F (36.7 C) (01/24 0338) Pulse Rate:  [59-68] 61 (01/24 0338) Cardiac Rhythm: Normal sinus rhythm;Bundle branch block;Heart block (01/23 1901) Resp:  [18-19] 18 (01/24 0338) BP: (83-114)/(40-63) 112/63 (01/24 0338) SpO2:  [94 %-98 %] 98 % (01/24 0338) Weight:  [237 lb 8 oz (107.7 kg)] 237 lb 8 oz (107.7 kg) (01/24 0500)     Intake/Output from previous day: 01/23 0701 - 01/24 0700 In: -  Out: 300 [Urine:300] Intake/Output this shift: No intake/output data recorded.  General appearance: alert, cooperative and no distress Heart: regular rate and rhythm, S1, S2 normal, no murmur, click, rub or gallop Lungs: clear to auscultation bilaterally Abdomen: soft, non-tender; bowel sounds normal; no masses,  no organomegaly Extremities: 1+ nonpitting pedal edema bilaterally Wound: clean and dry without drainage  Lab Results:  Recent Labs  04/05/16 0248  WBC 9.0  HGB 7.6*  HCT 23.4*  PLT 379   BMET:  Recent Labs  04/04/16 0333 04/05/16 0248  NA 129* 129*  K 3.8 4.1  CL 98* 98*  CO2 20* 20*  GLUCOSE 140* 97  BUN 47* 54*  CREATININE 1.78* 1.92*  CALCIUM 7.9* 8.1*    PT/INR: No results for input(s): LABPROT, INR in the last 72 hours. ABG    Component Value Date/Time   PHART 7.388 03/30/2016 1850   HCO3 21.8 03/30/2016 1850   TCO2 24 03/31/2016 1614   ACIDBASEDEF 3.0 (H) 03/30/2016 1850   O2SAT 99.0 03/30/2016 1850   CBG (last 3)   Recent Labs  04/04/16 1619  04/04/16 2101 04/05/16 0642  GLUCAP 194* 114* 104*    Assessment/Plan: S/P Procedure(s) (LRB): CORONARY ARTERY BYPASS GRAFTING (CABG) Times Two (N/A) AORTIC VALVE REPLACEMENT (AVR) (N/A) TRANSESOPHAGEAL ECHOCARDIOGRAM (TEE) (N/A) CLIPPING OF LEFT ATRIAL APPENDAGE (N/A)  1. CV- previous atrial fibrillation, currently NSR in the 60s. Taking Coreg, Amio, and Cozaar. BP soft at times- will discontinue the Cozaar and monitor.  2. Pulm-tolerating room air. He has been using his Is. He felt short of breath this morning but after some repositioning he felt a little better.  3. Renal-creatinine increased to 1.92. He has a foley catheter in place due to retaining, which has happened before. Remains fluid overloaded. Good urine output. Weight is stable. 4. GU-urinary retention. On Flomax. Foley catheter in place. Good urine output.  5. DM-blood sugar well controlled. Metformin discontinued due to AKI 6. Right upper extremity numbness and swelling. Left upper extremity with some numbess and swelling as well. Keep both arms elevated. Heating pad for right upper extremity. Some residual symptoms from his previous stroke before his CABG. Has become slightly worse since surgery.  7. Plan: Continue foley catheter and  flomax. Discontinue Cozaar due to hypotension and monitor. Creatinine continues to rise? Discontinued Metformin yesterday.    LOS: 6 days    Elgie Collard 04/05/2016 Patient seen and examined, agree with above I think creatinine elevated secondary to obstruction-  up a little again today but appears to be plateauing Keep Foley in one more day- voiding trial tomorrow Recheck CXR as he continues to complain of feeling short of breath  Remo Lipps C. Roxan Hockey, MD [facility] [phone]

## 2016-04-05 NOTE — Progress Notes (Addendum)
Called into pt room again with complaints of SOB. Continues to be 99% on room air. Asked RT to come see pt. Placed on CPAP for the pm. Again re-educated patient on oxygen levels, deep breathing, and relaxation. PA paged per pt request. Updated CXR to be done today rather than in the morning.   Fritz Pickerel, RN

## 2016-04-06 LAB — GLUCOSE, CAPILLARY
GLUCOSE-CAPILLARY: 185 mg/dL — AB (ref 65–99)
GLUCOSE-CAPILLARY: 189 mg/dL — AB (ref 65–99)
Glucose-Capillary: 146 mg/dL — ABNORMAL HIGH (ref 65–99)
Glucose-Capillary: 138 mg/dL — ABNORMAL HIGH (ref 65–99)

## 2016-04-06 MED ORDER — MAGNESIUM CITRATE PO SOLN
0.5000 | Freq: Once | ORAL | Status: AC
  Administered 2016-04-10: 0.5 via ORAL
  Filled 2016-04-06: qty 296

## 2016-04-06 NOTE — Care Management Important Message (Signed)
Important Message  Patient Details  Name: Mark Cabrera MRN: [id number] Date of Birth: 1951-01-07   Medicare Important Message Given:  Yes    [name] May 04/06/2016, 11:29 AM

## 2016-04-06 NOTE — Progress Notes (Signed)
Physical Therapy Treatment Patient Details Name: Mark Cabrera MRN: [id number] DOB: [birth year] Today's Date: 04/06/2016    History of Present Illness 66 yo s/p CABGx2. 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 Pt educated for all precautions, transfers, HEP and progression. Continue to recommend 24hr supervision for D/C. Will continue to follow.   HR 66-72 BP 122/71 sats 97% on RA  Follow Up Recommendations  Home health PT;Supervision/Assistance - 24 hour     Equipment Recommendations  Rolling walker with 5" wheels;3in1 (PT)    Recommendations for Other Services       Precautions / Restrictions Precautions Precautions: Sternal;Fall    Mobility  Bed Mobility Overal bed mobility: Needs Assistance Bed Mobility: Rolling;Sidelying to Sit Rolling: Min assist Sidelying to sit: Min assist       General bed mobility comments: cues for sequence and precautions, assist to rotate trunk and rise from surface  Transfers Overall transfer level: Needs assistance   Transfers: Sit to/from Stand Sit to Stand: Min assist         General transfer comment: cues for hand placement, safety and assist to rise x 2 trials  Ambulation/Gait Ambulation/Gait assistance: Min assist Ambulation Distance (Feet): 250 Feet Assistive device: Rolling walker (2 wheeled) Gait Pattern/deviations: Step-through pattern;Decreased stride length;Trunk flexed   Gait velocity interpretation: Below normal speed for age/gender General Gait Details: cues for posture and position in RW, guarding for balance and stability, no rests during gait, pt running into objects on right x 2   Stairs Stairs: Yes   Stair Management: Step to pattern;Forwards Number of Stairs: 2 General stair comments: hand held assist with  cues for sequence and increased time  Wheelchair Mobility    Modified Rankin (Stroke Patients Only)       Balance Overall balance assessment: Needs assistance   Sitting balance-Leahy Scale: Good       Standing balance-Leahy Scale: Poor                      Cognition Arousal/Alertness: Awake/alert Behavior During Therapy: Flat affect Overall Cognitive Status: Impaired/Different from baseline Area of Impairment: Orientation Orientation Level: Time   Memory: Decreased recall of precautions         General Comments: pt able to recall 2/5 precautions    Exercises General Exercises - Lower Extremity Long Arc Quad: AROM;Both;Seated;15 reps Hip Flexion/Marching: AROM;Both;Seated;15 reps    General Comments        Pertinent Vitals/Pain Pain Score: 5  Pain Location: sternal Pain Descriptors / Indicators: Sore;Operative site guarding Pain Intervention(s): Limited activity within patient's tolerance;Monitored during session;Repositioned    Home Living                      Prior Function            PT Goals (current goals can now be found in the care plan section) Progress towards PT goals: Progressing toward goals    Frequency           PT Plan Current plan remains appropriate    Co-evaluation             End of Session Equipment Utilized During Treatment: Gait belt Activity Tolerance: Patient tolerated treatment well Patient left: in chair;with call bell/phone within reach;with chair alarm set  Time: 1020-1059 PT Time Calculation (min) (ACUTE ONLY): 39 min  Charges:  $Gait Training: 8-22 mins $Therapeutic Exercise: 8-22 mins                    G Codes:      Mark Cabrera April 30, 2016, 11:02 AM Elwyn Reach, [place]

## 2016-04-06 NOTE — Progress Notes (Signed)
Removed foley for voiding trial.  Patient voided x 2 and did not want to be scanned until he ate. Patient feels fine.  Residual void approximately 45 minutes post void 122 ml. Patient wanted condom cath applied after foley removal.  Condom cath came off twice and patient will use urinal per request. Pt resting with call bell within reach.  Will continue to monitor. Payton Emerald, RN

## 2016-04-06 NOTE — Progress Notes (Signed)
CARDIAC REHAB PHASE I   PRE:  Rate/Rhythm: 63 SR    BP: sitting 120/69    SaO2: 100 2L, 97 RA  MODE:  Ambulation: 350 ft   POST:  Rate/Rhythm: 72 SR    BP: sitting 139/75     SaO2: 98-99 RA  Pt able to stand with min assist and walk with RW, gait belt for security. Much steadier today. Still c/o right leg numbness; noted smaller steps on right. Increased distance, less rest required. To BSC after walk. Off O2. Will f/u. Encouraged IS. [id number]   Darrick Meigs CES, ACSM 04/06/2016 1:35 PM

## 2016-04-06 NOTE — Progress Notes (Addendum)
[address]       [place] [address]             [phone]      7 Days Post-Op Procedure(s) (LRB): CORONARY ARTERY BYPASS GRAFTING (CABG) Times Two (N/A) AORTIC VALVE REPLACEMENT (AVR) (N/A) TRANSESOPHAGEAL ECHOCARDIOGRAM (TEE) (N/A) CLIPPING OF LEFT ATRIAL APPENDAGE (N/A) Subjective: A little short of breath this morning. Has not had a bowel movement since before surgery.   Objective: Vital signs in last 24 hours: Temp:  [96.7 F (35.9 C)-97.9 F (36.6 C)] 96.7 F (35.9 C) (01/25 0627) Pulse Rate:  [63-78] 63 (01/25 0044) Cardiac Rhythm: Other (Comment) (01/25 0234) Resp:  [17-18] 18 (01/25 0627) BP: (100-126)/(53-63) 126/57 (01/25 0627) SpO2:  [95 %-100 %] 95 % (01/25 0627) Weight:  [239 lb 4.8 oz (108.5 kg)] 239 lb 4.8 oz (108.5 kg) (01/25 0626)     Intake/Output from previous day: 01/24 0701 - 01/25 0700 In: 960 [P.O.:960] Out: 2475 [Urine:2475] Intake/Output this shift: No intake/output data recorded.  General appearance: alert, cooperative and no distress Heart: regular rate and rhythm, S1, S2 normal, no murmur, click, rub or gallop Lungs: clear to auscultation bilaterally Abdomen: soft, non-tender; bowel sounds normal; no masses,  no organomegaly Extremities: extremities normal, atraumatic, no cyanosis or edema Wound: clean and dry  Lab Results:  Recent Labs  04/05/16 0248  WBC 9.0  HGB 7.6*  HCT 23.4*  PLT 379   BMET:  Recent Labs  04/04/16 0333 04/05/16 0248  NA 129* 129*  K 3.8 4.1  CL 98* 98*  CO2 20* 20*  GLUCOSE 140* 97  BUN 47* 54*  CREATININE 1.78* 1.92*  CALCIUM 7.9* 8.1*    PT/INR: No results for input(s): LABPROT, INR in the last 72 hours. ABG    Component Value Date/Time   PHART 7.388 03/30/2016 1850   HCO3 21.8 03/30/2016 1850   TCO2 24 03/31/2016 1614   ACIDBASEDEF 3.0 (H) 03/30/2016 1850   O2SAT 99.0 03/30/2016 1850   CBG (last 3)   Recent Labs  04/05/16 1652 04/05/16 2102  04/06/16 0618  GLUCAP 155* 177* 185*    Assessment/Plan: S/P Procedure(s) (LRB): CORONARY ARTERY BYPASS GRAFTING (CABG) Times Two (N/A) AORTIC VALVE REPLACEMENT (AVR) (N/A) TRANSESOPHAGEAL ECHOCARDIOGRAM (TEE) (N/A) CLIPPING OF LEFT ATRIAL APPENDAGE (N/A)   1. CV- previous atrial fibrillation, currently NSR in the 60s.  EPW still in place. Coreg adjusted yesterday for bradycardia. Taking Coreg and Amio. BP improved 2. Pulm-tolerating room air. He has been using his Is. CXR showed: left pleural effusions, atelectasis, and possible pneumonia. Right lung is clear with trace pleural effusion.  3. Renal-creatinine increased to 1.92. He has a foley catheter in place due to retaining, which has happened before. Remains fluid overloaded. Good urine output. Weight is stable. Voiding trial today.   4. GU-urinary retention. On Flomax. Foley catheter in place. Good urine output.  5. DM-blood sugar well controlled. Metformin discontinued due to AKI 6. Right upper extremity numbness and swelling. Left upper extremity with some numbess and swelling as well. Keep both arms elevated. Heating pad for right upper extremity. Some residual symptoms from his previous stroke before his CABG. Has become slightly worse since surgery.  7. Last H and H low 7.6/23.4, will order CBC for tomorrow.  Plan: Voiding trial today. Is on flomax. BP is better this morning. Progressing well. Mag citrate for constipation.    LOS: 7 days    Mark Cabrera 04/06/2016  Patient  seen and examined. He feels better this afternoon. Did pretty well with ambulation today Swelling in right arm is improving Will repeat creatinine in AM-hopefully can resume lasix tomorrow  Remo Lipps C. Roxan Hockey, MD [facility] [phone]

## 2016-04-07 LAB — CBC
HCT: 23 % — ABNORMAL LOW (ref 39.0–52.0)
Hemoglobin: 7.6 g/dL — ABNORMAL LOW (ref 13.0–17.0)
MCH: 27.1 pg (ref 26.0–34.0)
MCHC: 33 g/dL (ref 30.0–36.0)
MCV: 82.1 fL (ref 78.0–100.0)
PLATELETS: 406 10*3/uL — AB (ref 150–400)
RBC: 2.8 MIL/uL — ABNORMAL LOW (ref 4.22–5.81)
RDW: 13.8 % (ref 11.5–15.5)
WBC: 6.3 10*3/uL (ref 4.0–10.5)

## 2016-04-07 LAB — GLUCOSE, CAPILLARY
GLUCOSE-CAPILLARY: 139 mg/dL — AB (ref 65–99)
GLUCOSE-CAPILLARY: 204 mg/dL — AB (ref 65–99)
Glucose-Capillary: 211 mg/dL — ABNORMAL HIGH (ref 65–99)
Glucose-Capillary: 110 mg/dL — ABNORMAL HIGH (ref 65–99)

## 2016-04-07 LAB — BASIC METABOLIC PANEL
Anion gap: 9 (ref 5–15)
BUN: 27 mg/dL — ABNORMAL HIGH (ref 6–20)
CALCIUM: 8.4 mg/dL — AB (ref 8.9–10.3)
CO2: 22 mmol/L (ref 22–32)
CREATININE: 1.12 mg/dL (ref 0.61–1.24)
Chloride: 102 mmol/L (ref 101–111)
GFR calc Af Amer: >60 mL/min (ref >60)
GLUCOSE: 127 mg/dL — AB (ref 65–99)
POTASSIUM: 4.6 mmol/L (ref 3.5–5.1)
SODIUM: 133 mmol/L — AB (ref 135–145)

## 2016-04-07 MED ORDER — FUROSEMIDE 40 MG PO TABS
40.0000 mg | ORAL_TABLET | Freq: Two times a day (BID) | ORAL | Status: DC
  Administered 2016-04-07 – 2016-04-08 (×2): 40 mg via ORAL
  Filled 2016-04-07 (×2): qty 1

## 2016-04-07 NOTE — Evaluation (Signed)
Occupational Therapy Evaluation Patient Details Name: Mark Cabrera MRN: [id number] DOB: 1950-05-01 Today's Date: 04/07/2016    History of Present Illness 66 yo s/p CABGx2. 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 Presents with generalized weakness and decreased activity tolerance. He is dependent in LB ADL and requires min guard assist for standing ADL and transfers. Pt reports chronic pain and impaire sensation in his R UE and disequilibrium in sitting and standing since his prior stroke. Issued and instructed pt in use of AE. Educated in sternal precautions related to IADL. Instructed in multiple uses of 3 in 1 and benefits of tub seat for energy conservation. Pt plans to go home with his brother and sister in law upon d/c. Will follow acutely.    Follow Up Recommendations  Home health OT;Supervision/Assistance - 24 hour    Equipment Recommendations  3 in 1 bedside commode;Tub/shower seat    Recommendations for Other Services       Precautions / Restrictions Precautions Precautions: Sternal;Fall Restrictions Weight Bearing Restrictions: Yes (sternal precautions)      Mobility Bed Mobility Overal bed mobility: Needs Assistance Bed Mobility: Rolling;Sidelying to Sit;Sit to Supine Rolling: Min assist Sidelying to sit: Min assist   Sit to supine: Min assist      Transfers Overall transfer level: Needs assistance Equipment used: Rolling walker (2 wheeled) Transfers: Sit to/from Stand Sit to Stand: Min guard              Balance Overall balance assessment: Needs assistance   Sitting balance-Leahy Scale: Fair Sitting balance - Comments: pt reports falling out of sitting with LB dressing at baseline     Standing balance-Leahy Scale: Poor                              ADL Overall ADL's : Needs  assistance/impaired Eating/Feeding: Independent;Sitting   Grooming: Standing;Min guard   Upper Body Bathing: Set up;Sitting   Lower Body Bathing: Minimal assistance;Sit to/from stand Lower Body Bathing Details (indicate cue type and reason): issued and instructed in use of long handled bath sponge Upper Body Dressing : Set up;Sitting   Lower Body Dressing: Minimal assistance;Sit to/from stand Lower Body Dressing Details (indicate cue type and reason): issued reacher, sock aide and long handled shoe horn and instructed in use   Toilet Transfer Details (indicate cue type and reason): educated pt in use of 3 in 1 to elevated toilet, discouraged pt from using as BSC unless needed for safety at night       Tub/Shower Transfer Details (indicate cue type and reason): educated in benefits of seated showering, requested CM order tub seat Functional mobility during ADLs: Min guard;Rolling walker General ADL Comments: Began instruction in fall prevention and energy conservation. Instructed in sternal precautions related to IADL     Vision     Perception     Praxis      Pertinent Vitals/Pain Pain Assessment: 0-10 Pain Score: 5  Pain Location: right arm Pain Descriptors / Indicators: Sore Pain Intervention(s): Monitored during session     Hand Dominance Right   Extremity/Trunk Assessment Upper Extremity Assessment Upper Extremity Assessment: RUE deficits/detail RUE Deficits / Details: strength WFL, pt reports impaired sensation and chronic pain since CVA 10 years ago   Lower Extremity Assessment Lower Extremity Assessment: Defer to PT evaluation  Cervical / Trunk Assessment Cervical / Trunk Assessment: Normal   Communication Communication Communication: No difficulties   Cognition Arousal/Alertness: Awake/alert Behavior During Therapy: Flat affect Overall Cognitive Status: Within Functional Limits for tasks assessed                 General Comments: pt able to  recall 3/5 precautions   General Comments       Exercises       Shoulder Instructions      Home Living Family/patient expects to be discharged to:: Private residence Living Arrangements: Alone Available Help at Discharge: Family;Available 24 hours/day Type of Home: House Home Access: Stairs to enter [employer] of Steps: 1   Home Layout: One level     Bathroom Shower/Tub: Occupational psychologist: Standard     Home Equipment: None          Prior Functioning/Environment Level of Independence: Independent                 OT Problem List: Decreased strength;Decreased activity tolerance;Impaired balance (sitting and/or standing);Decreased safety awareness;Decreased knowledge of use of DME or AE;Decreased knowledge of precautions;Pain   OT Treatment/Interventions: Self-care/ADL training;DME and/or AE instruction;Patient/family education;Energy conservation;Therapeutic activities    OT Goals(Current goals can be found in the care plan section) Acute Rehab OT Goals Patient Stated Goal: return to my house OT Goal Formulation: With patient Time For Goal Achievement: 04/14/16 Potential to Achieve Goals: Good ADL Goals Pt Will Perform Grooming: with supervision;standing (3 activities) Pt Will Perform Lower Body Bathing: with supervision;with adaptive equipment;sit to/from stand Pt Will Perform Lower Body Dressing: with supervision;with adaptive equipment;sit to/from stand Pt Will Transfer to Toilet: with supervision;ambulating;bedside commode (over toilet) Pt Will Perform Toileting - Clothing Manipulation and hygiene: with supervision;sit to/from stand Additional ADL Goal #1: Pt will generalize energy conservation strategies in ADL and mobility independently.  OT Frequency: Min 2X/week   Barriers to D/C:            Co-evaluation              End of Session Equipment Utilized During Treatment: Gait belt;Rolling walker  Activity  Tolerance: Patient tolerated treatment well Patient left: in bed;with call bell/phone within reach   Time: 1441-1507 OT Time Calculation (min): 26 min Charges:  OT General Charges $OT Visit: 1 Procedure OT Evaluation $OT Eval Moderate Complexity: 1 Procedure OT Treatments $Self Care/Home Management : 8-22 mins G-Codes:    Malka So 04/07/2016, 3:39 PM  [phone]

## 2016-04-07 NOTE — Progress Notes (Addendum)
Patient sitting up in bed, will bring additional pain medication to be given with night meds. Warm prune juice given for constipation. Call light within reach

## 2016-04-07 NOTE — Progress Notes (Addendum)
[address]       [place] [address]             [phone]      8 Days Post-Op Procedure(s) (LRB): CORONARY ARTERY BYPASS GRAFTING (CABG) Times Two (N/A) AORTIC VALVE REPLACEMENT (AVR) (N/A) TRANSESOPHAGEAL ECHOCARDIOGRAM (TEE) (N/A) CLIPPING OF LEFT ATRIAL APPENDAGE (N/A) Subjective: Feels poorly - generalized, although right hand with numbness and discomfort and sternal incis sore  Objective: Vital signs in last 24 hours: Temp:  [97.9 F (36.6 C)-98.3 F (36.8 C)] 97.9 F (36.6 C) (01/26 0451) Pulse Rate:  [63-72] 63 (01/26 0451) Cardiac Rhythm: Normal sinus rhythm;Bundle branch block;Heart block (01/26 0743) Resp:  [18] 18 (01/26 0451) BP: (111-136)/(60-71) 136/60 (01/26 0451) SpO2:  [97 %-100 %] 100 % (01/26 0451) Weight:  [228 lb 3.2 oz (103.5 kg)] 228 lb 3.2 oz (103.5 kg) (01/26 0451)  Hemodynamic parameters for last 24 hours:    Intake/Output from previous day: 01/25 0701 - 01/26 0700 In: 680 [P.O.:680] Out: 2045 [Urine:2045] Intake/Output this shift: No intake/output data recorded.  General appearance: alert, cooperative, fatigued and no distress Heart: regular rate and rhythm and 2/6 systolic murmur Lungs: dim left base Abdomen: soft, some distension, non-tender, + BS Extremities: + RLE edema Wound: incis healing well  Lab Results:  Recent Labs  04/05/16 0248 04/07/16 0328  WBC 9.0 6.3  HGB 7.6* 7.6*  HCT 23.4* 23.0*  PLT 379 406*   BMET:  Recent Labs  04/05/16 0248 04/07/16 0328  NA 129* 133*  K 4.1 4.6  CL 98* 102  CO2 20* 22  GLUCOSE 97 127*  BUN 54* 27*  CREATININE 1.92* 1.12  CALCIUM 8.1* 8.4*    PT/INR: No results for input(s): LABPROT, INR in the last 72 hours. ABG    Component Value Date/Time   PHART 7.388 03/30/2016 1850   HCO3 21.8 03/30/2016 1850   TCO2 24 03/31/2016 1614   ACIDBASEDEF 3.0 (H) 03/30/2016 1850   O2SAT 99.0 03/30/2016 1850   CBG (last 3)   Recent Labs  04/06/16 1607  04/06/16 2046 04/07/16 0616  GLUCAP 146* 138* 139*    Meds Scheduled Meds: . amiodarone  400 mg Oral BID PC  . aspirin EC  325 mg Oral Daily  . atorvastatin  80 mg Oral q1800  . bisacodyl  10 mg Oral Daily   Or  . bisacodyl  10 mg Rectal Daily  . carvedilol  3.125 mg Oral BID WC  . chlorhexidine gluconate (MEDLINE KIT)  15 mL Mouth Rinse BID  . docusate sodium  200 mg Oral Daily  . enoxaparin (LOVENOX) injection  40 mg Subcutaneous QHS  . furosemide  40 mg Oral Daily  . gabapentin  100 mg Oral Daily  . glipiZIDE  10 mg Oral QAC breakfast  . insulin aspart  0-20 Units Subcutaneous TID WC  . insulin aspart  0-5 Units Subcutaneous QHS  . magnesium citrate  0.5 Bottle Oral Once  . mouth rinse  15 mL Mouth Rinse BID  . pantoprazole  40 mg Oral Daily  . potassium chloride  20 mEq Oral BID  . sodium chloride flush  3 mL Intravenous Q12H  . sodium chloride flush  3 mL Intravenous Q12H  . tamsulosin  0.4 mg Oral QPC supper   Continuous Infusions: . sodium chloride Stopped (03/31/16 0600)   PRN Meds:.sodium chloride, ALPRAZolam, metoprolol, morphine injection, ondansetron (ZOFRAN) IV, oxyCODONE, sodium chloride flush, sodium chloride flush, traMADol  Xrays  Dg Chest 2 View  Result Date: 04/05/2016 CLINICAL DATA:  Shortness of breath and weakness. Postop day 6 status post CABG. EXAM: CHEST  2 VIEW COMPARISON:  04/02/2016 FINDINGS: Aortic valve prosthesis. Prior CABG. Epicardial pacer leads noted. Left atrial appendage clip observed. No sternal wire migration. Atherosclerotic calcification of the aortic arch. Continued fairly dense retrocardiac airspace opacity with obscuration left hemidiaphragm. Mildly improved atelectasis in The left upper lobe. The right lung remains clear. No overt edema although there is some mild blunting of the right posterior costophrenic angle suggesting a trace right pleural effusion. IMPRESSION: 1. Continued retrocardiac airspace opacity on the left common not  appreciably changed. This could be from atelectasis, pneumonia, and/ or left pleural effusion. However, the left upper lobe bandlike atelectasis has improved. 2. The right lung remains clear. 3. Postoperative findings from CABG, aortic valve repair, and left atrial appendage clipping. Epicardial pacer leads noted. 4. Trace right pleural effusion. Electronically Signed   By: Van Clines M.D.   On: 04/05/2016 16:29    Assessment/Plan: S/P Procedure(s) (LRB): CORONARY ARTERY BYPASS GRAFTING (CABG) Times Two (N/A) AORTIC VALVE REPLACEMENT (AVR) (N/A) TRANSESOPHAGEAL ECHOCARDIOGRAM (TEE) (N/A) CLIPPING OF LEFT ATRIAL APPENDAGE (N/A) 1 steady progress 2 hemodyn stable in sinus 3 anemia is stable but close to transfusion threshold- monitor, not sure he would tolerate Iron at this time- will try 4 urinating ok, renal fxn now normal- cont lasix 5 rhythm stable on amio 6 sugars adeq controlled- needs dietary management 7 right arm stable- will ask OT to see 8 push rehab/pulm toilet as able  LOS: 8 days    GOLD,WAYNE E 04/07/2016 Patient seen and examined, agree with above He continues to c/o right arm feeling cold and weak. He has good perfusion with a 2+ radial pulse, brisk cap refill and skin is warm and dry. He also moves the arm well with strength only mildly down. Will ask OT to see to work with him on the arm. Creatinine back to normal- resolved AKI secondary to obstruction. Recheck BMET in AM  McArthur C. Roxan Hockey, MD [facility] [phone]

## 2016-04-07 NOTE — Progress Notes (Signed)
PT Cancellation Note  Patient Details Name: Mark Cabrera MRN: [id number] DOB: 09-Jun-1950   Cancelled Treatment:    Reason Eval/Treat Not Completed: Other (comment) (pt requested assist to sit up in bed to eat prior to mobility. Will return this am)   Valisa Karpel B Prather Failla 04/07/2016, [place], [place]

## 2016-04-07 NOTE — Care Management Note (Signed)
Case Management Note Marvetta Gibbons RN, BSN Unit 2W-Case Manager [phone]  Patient Details  Name: Mark Cabrera MRN: [id number] Date of Birth: [birth year]  Subjective/Objective:    Pt admitted for planned CABG               Action/Plan: PTA pt lived in shed- per MD note with no electricity or heat- also has no bathroom. - CSW consulted for possible SNF placement post-op due to housing issues.   Expected Discharge Date:                  Expected Discharge Plan:  [place]  In-House Referral:     Discharge planning Services  CM Consult  Post Acute Care Choice:  Home Health, Durable Medical Equipment Choice offered to:  Patient, Sibling  DME Arranged:  3-N-1, Tub bench, Walker rolling DME Agency:  [place]:    [place]:  [place]  Status of Service:  In process, will continue to follow  If discussed at Long Length of Stay Meetings, dates discussed:    Additional Comments:  04/07/16- 1530- Marvetta Gibbons RN, CM- PT/OT evals recommending HH therapies- will need HH orders with face 2 face prior to discharge- pt will also need orders for DME- RW, 3n1, and shower bench with back- spoke with pt at bedside- plan remains to go to his brothers home on discharge- pt gave permission to contact his brother- [name]- to confirm plans and address- contact # [phone]- call made to [facility]- and per conversation confirmed that pt's brother is ok with pt coming to his home at discharge and having [facility] services- address at brother's home is- [address] [place], [place] did inform this CM that he has been sick with a "cold/flu" and diarhea over the last 3-4 day- has not been to the doctor and no confirmed flu and no prescription for tamiflu being taken. Brother states he is starting to feel better and hopes to be over whatever he has by the time pt ready to discharge (maybe first of next week). Spoke again with pt  regarding [facility] services- choice offered and pt states he does not have a preference as long as insurance covers the services- will plan to use [facility]- referral called to [facility] with Alvis Lemmings- CM will need orders prior to discharge for both [facility] and DME.    Per Elenor Quinones- RN, CM-Pt plans to discharge to his brothers home - brother and wife will be with patient 24/7 as recommended per pt.  CM was unable to contact brother to verify plan.   Dahlia Client [place], RN 04/07/2016, 3:36 PM [phone]

## 2016-04-07 NOTE — Discharge Instructions (Signed)
Aortic Valve Replacement, Care After °Refer to this sheet in the next few weeks. These instructions provide you with information about caring for yourself after your procedure. Your health care provider may also give you more specific instructions. Your treatment has been planned according to current medical practices, but problems sometimes occur. Call your health care provider if you have any problems or questions after your procedure. °What can I expect after the procedure? °After the procedure, it is common to have: °· Pain around your incision area. °· A small amount of blood or clear fluid coming from your incision. ° °Follow these instructions at home: °Eating and drinking ° °· Follow instructions from your health care provider about eating or drinking restrictions. °? Limit alcohol intake to no more than 1 drink per day for nonpregnant women and 2 drinks per day for men. One drink equals 12 oz of beer, 5 oz of wine, or 1½ oz of hard liquor. °? Limit how much caffeine you drink. Caffeine can affect your heart's rate and rhythm. °· Drink enough fluid to keep your urine clear or pale yellow. °· Eat a heart-healthy diet. This should include plenty of fresh fruits and vegetables. If you eat meat, it should be lean cuts. Avoid foods that are: °? High in salt, saturated fat, or sugar. °? Canned or highly processed. °? Fried. °Activity °· Return to your normal activities as told by your health care provider. Ask your health care provider what activities are safe for you. °· Exercise regularly once you have recovered, as told by your health care provider. °· Avoid sitting for more than 2 hours at a time without moving. Get up and move around at least once every 1-2 hours. This helps to prevent blood clots in the legs. °· Do not lift anything that is heavier than 10 lb (4.5 kg) until your health care provider approves. °· Avoid pushing or pulling things with your arms until your health care provider approves. This  includes pulling on handrails to help you climb stairs. °Incision care ° °· Follow instructions from your health care provider about how to take care of your incision. Make sure you: °? Wash your hands with soap and water before you change your bandage (dressing). If soap and water are not available, use hand sanitizer. °? Change your dressing as told by your health care provider. °? Leave stitches (sutures), skin glue, or adhesive strips in place. These skin closures may need to stay in place for 2 weeks or longer. If adhesive strip edges start to loosen and curl up, you may trim the loose edges. Do not remove adhesive strips completely unless your health care provider tells you to do that. °· Check your incision area every day for signs of infection. Check for: °? More redness, swelling, or pain. °? More fluid or blood. °? Warmth. °? Pus or a bad smell. °Medicines °· Take over-the-counter and prescription medicines only as told by your health care provider. °· If you were prescribed an antibiotic medicine, take it as told by your health care provider. Do not stop taking the antibiotic even if you start to feel better. °Travel °· Avoid airplane travel for as long as told by your health care provider. °· When you travel, bring a list of your medicines and a record of your medical history with you. Carry your medicines with you. °Driving °· Ask your health care provider when it is safe for you to drive. Do not drive until your health   care provider approves. °· Do not drive or operate heavy machinery while taking prescription pain medicine. °Lifestyle ° °· Do not use any tobacco products, such as cigarettes, chewing tobacco, or e-cigarettes. If you need help quitting, ask your health care provider. °· Resume sexual activity as told by your health care provider. Do not use medicines for erectile dysfunction unless your health care provider approves, if this applies. °· Work with your health care provider to keep your  blood pressure and cholesterol under control, and to manage any other heart conditions that you have. °· Maintain a healthy weight. °General instructions °· Do not take baths, swim, or use a hot tub until your health care provider approves. °· Do not strain to have a bowel movement. °· Avoid crossing your legs while sitting down. °· Check your temperature every day for a fever. A fever may be a sign of infection. °· If you are a woman and you plan to become pregnant, talk with your health care provider before you become pregnant. °· Wear compression stockings if your health care provider instructs you to do this. These stockings help to prevent blood clots and reduce swelling in your legs. °· Tell all health care providers who care for you that you have an artificial (prosthetic) aortic valve. If you have or have had heart disease or endocarditis, tell all health care providers about these conditions as well. °· Keep all follow-up visits as told by your health care provider. This is important. °Contact a health care provider if: °· You develop a skin rash. °· You experience sudden, unexplained changes in your weight. °· You have more redness, swelling, or pain around your incision. °· You have more fluid or blood coming from your incision. °· Your incision feels warm to the touch. °· You have pus or a bad smell coming from your incision. °· You have a fever. °Get help right away if: °· You develop chest pain that is different from the pain coming from your incision. °· You develop shortness of breath or difficulty breathing. °· You start to feel light-headed. °These symptoms may represent a serious problem that is an emergency. Do not wait to see if the symptoms will go away. Get medical help right away. Call your local emergency services (911 in the [place] Do not drive yourself to the hospital. °This information is not intended to replace advice given to you by your health care provider. Make sure you discuss any  questions you have with your health care provider. °Document Released: 09/15/2004 Document Revised: 08/05/2015 Document Reviewed: 01/31/2015 °Elsevier Interactive Patient Education © 2017 [employer] ° °Coronary Artery Bypass Grafting, Care After °These instructions give you information on caring for yourself after your procedure. Your doctor may also give you more specific instructions. Call your doctor if you have any problems or questions after your procedure. °Follow these instructions at home: °· Only take medicine as told by your doctor. Take medicines exactly as told. Do not stop taking medicines or start any new medicines without talking to your doctor first. °· Take your pulse as told by your doctor. °· Do deep breathing as told by your doctor. Use your breathing device (incentive spirometer), if given, to practice deep breathing several times a day. Support your chest with a pillow or your arms when you take deep breaths or cough. °· Keep the area clean, dry, and protected where the surgery cuts (incisions) were made. Remove bandages (dressings) only as told by your doctor. If   strips were applied to surgical area, do not take them off. They fall off on their own. °· Check the surgery area daily for puffiness (swelling), redness, or leaking fluid. °· If surgery cuts were made in your legs: °? Avoid crossing your legs. °? Avoid sitting for long periods of time. Change positions every 30 minutes. °? Raise your legs when you are sitting. Place them on pillows. °· Wear stockings that help keep blood clots from forming in your legs (compression stockings). °· Only take sponge baths until your doctor says it is okay to take showers. Pat the surgery area dry. Do not rub the surgery area with a washcloth or towel. Do not bathe, swim, or use a hot tub until your doctor says it is okay. °· Eat foods that are high in fiber. These include raw fruits and vegetables, whole grains, beans, and nuts. Choose lean meats.  Avoid canned, processed, and fried foods. °· Drink enough fluids to keep your pee (urine) clear or pale yellow. °· Weigh yourself every day. °· Rest and limit activity as told by your doctor. You may be told to: °? Stop any activity if you have chest pain, shortness of breath, changes in heartbeat, or dizziness. Get help right away if this happens. °? Move around often for short amounts of time or take short walks as told by your doctor. Gradually become more active. You may need help to strengthen your muscles and build endurance. °? Avoid lifting, pushing, or pulling anything heavier than 10 pounds (4.5 kg) for at least 6 weeks after surgery. °· Do not drive until your doctor says it is okay. °· Ask your doctor when you can go back to work. °· Ask your doctor when you can begin sexual activity again. °· Follow up with your doctor as told. °Contact a doctor if: °· You have puffiness, redness, more pain, or fluid draining from the incision site. °· You have a fever. °· You have puffiness in your ankles or legs. °· You have pain in your legs. °· You gain 2 or more pounds (0.9 kg) a day. °· You feel sick to your stomach (nauseous) or throw up (vomit). °· You have watery poop (diarrhea). °Get help right away if: °· You have chest pain that goes to your jaw or arms. °· You have shortness of breath. °· You have a fast or irregular heartbeat. °· You notice a "clicking" in your breastbone when you move. °· You have numbness or weakness in your arms or legs. °· You feel dizzy or light-headed. °This information is not intended to replace advice given to you by your health care provider. Make sure you discuss any questions you have with your health care provider. °Document Released: 03/04/2013 Document Revised: 08/05/2015 Document Reviewed: 08/06/2012 °Elsevier Interactive Patient Education © 2017 Elsevier Inc. ° °

## 2016-04-07 NOTE — Discharge Summary (Signed)
Physician Discharge Summary  Patient ID: Mark Cabrera MRN: [id number] DOB/AGE: Nov 07, 1950 66 y.o.  Admit date: 03/30/2016 Discharge date: 04/10/2016  Admission Diagnoses:Critical aortic stenosis and severe coronary artery disease  Discharge Diagnoses:  Critical Aortic stenosis Coronary artery disease S/p CABG x 2 and Aortic valve replacement Postoperative atrial fibrillation  Patient Active Problem List   Diagnosis Date Noted  . 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  . Critical aortic valve stenosis 03/16/2016  . Coronary artery disease involving native coronary artery of native heart without angina pectoris 03/16/2016  . Acute diastolic CHF (congestive heart failure) ([place])   . Recent head trauma 10/17/2012  . Seborrheic keratosis 04/17/2011  . Osteoarthritis, knee   . Preventative health care 05/02/2010  . REFLEX SYMPATHETIC DYSTROPHY 08/02/2009  . SLEEP APNEA 08/02/2009  . Diabetes mellitus with neurological manifestation ([place]) 10/18/2007  . DIZZINESS 05/27/2007  . OTHER CHRONIC NONALCOHOLIC LIVER DISEASE [id number]  . Diabetic neuropathy ([facility]) 08/07/2006  . Orthopnea 07/11/2006  . Dyslipidemia 05/18/2006  . HYPERTENSION, BENIGN ESSENTIAL, CONTROLLED 05/18/2006  . CEREBROVASCULAR ACCIDENT, HX OF 05/18/2006  . GASTROESOPHAGEAL REFLUX DISEASE 04/26/2006  . 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 He presents with a 6 month history of dyspnea with exertion. Over the past several weeks it has become more frequent and severe. It has gotten to the point he would be SOB with walking across a room and he would gasp for breath while talking. HE recently has been unable to lie on his back due to dyspnea.  He had a repeat echo in December which showed progression of his AS. His mean gradient was 47 and his peak 78 mmHg. His  calculated valve area was 0.87 cm2. His EF was normal and he had grade 2 diastolic dysfunction.  Yesterday he had cardiac catheterization which revealed a mean gradient of 38 mmHg, peak of 58 mmHg, elevated filling pressures and 2 vessel CAD- tight RCA stenosis, moderate circumflex. He had plaque in LAD but no hemodynamically significant.  He says he had some teeth extracted by a dentist 2 months ago but does not have any active dental issues. HE does not check his blood sugar at home.  He was seen in cardiothoracic surgical consultation by Modesto Charon who evaluated the patient and studies and recommended aortic valve replacement and coronary artery bypass grafting. He was admitted for the procedure.  Discharged Condition: good  Hospital Course:   He was taken to the operating room on 03/30/2016.  He underwent CABG x 2 utilizing saphenous vein graft to PDA and OM 1, AVR with a 23 mm Edwards Bovine Pericardial valve, and Clipping of LA Appendage.  He tolerated the procedure without difficulty and was taken to the SICU the in stable condition.  During his stay in the SICU the patient's CXR showed a small pneumothorax.  This resolved and patient's chest tubes were removed without difficulty.  He had some episodes of paroxysmal Atrial Fibrillation and was started on oral Amiodarone.  He was hypervolemic and treated with diuresis.  He was medically stable and transferred to the telemetry unit on POD #2.  He developed urinary retention post foley removal.  He required replacement of foley catheter. The patients progress was slow post operatively.  He has continued to maintain NSR and his pacing wires were removed without difficulty.  He had multiple complains of numbness  in [place] which is residual effect from previous stroke.  He had episodes of shortness of breath.  His saturations have remained > 90% and CXR was free from pneumothorax.  The patient passed his voiding trial and his foley catheter was  removed.  He is ambulating without minimal assistance.  His pain is well controlled.  He will be discharged home to his brother house today.           Consults: cardiology  Significant Diagnostic Studies: routine serial CXR's/post-op labs  Treatments: surgery:    DATE OF PROCEDURE:  03/30/2016 DATE OF DISCHARGE:                              OPERATIVE REPORT   PREOPERATIVE DIAGNOSIS:  Severe aortic stenosis with 2-vessel coronary Disease. New onset atrial fibrillation  POSTOPERATIVE DIAGNOSIS:  Severe aortic stenosis with 2-vessel coronary Disease. New onset atrial fibrillation  PROCEDURE:  Median sternotomy, extracorporeal circulation Coronary artery bypass grafting x 2      Saphenous vein graft to posterior descending      Saphenous vein graft to obtuse marginal 1 Endoscopic vein harvest right leg Aortic valve replacement with 23 mm [facility] Ease bovine pericardial valve. Left atrial appendage clip  SURGEON:  Remo Lipps C. Roxan Hockey, MD.  ASSISTANT:  Nicholes Rough, PA.  ANESTHESIA:  General.  FINDINGS:  1.In new onset atrial fibrillation in preop holding.  2.Transesophageal echocardiography showed ejection fraction approximately 50% with severe aortic stenosis and mild aortic insufficiency.  There was mild mitral regurgitation as well. 3.Intraoperative findings- good quality vein and good quality targets.      Aortic valve tricuspid with heavily calcified leaflets and severe      annular calcification.   4.Post bypass transesophageal echocardiography revealed good function of the prosthetic valve with no perivalvular leaks and no change in left ventricular function.  Disposition: 01-Home or Self Care   Discharge medications:  The patient has been discharged on:   1.Beta Blocker:  Yes [ x  ]                              No   [   ]                              If No, reason:  2.Ace Inhibitor/ARB: Yes [   ]                                     No  [   x  ]                                     If No, reason: Labile BP  3.Statin:   Yes [ x  ]                  No  [   ]                  If No, reason:  4.Ecasa:  Yes  [ x  ]                  No   [   ]  If No, reason:      Allergies as of 04/10/2016      Reactions   Crab (diagnostic) Anaphylaxis   Penicillins Other (See Comments)   Has patient had a PCN reaction causing immediate rash, facial/tongue/throat swelling, SOB or lightheadedness with hypotension: unknown Has patient had a PCN reaction causing severe rash involving mucus membranes or skin necrosis: unknown Has patient had a PCN reaction that required hospitalization No Has patient had a PCN reaction occurring within the last 10 years: Yes If all of the above answers are "NO", then may proceed with Cephalosporin use.      Medication List    TAKE these medications   amiodarone 200 MG tablet Commonly known as:  PACERONE Take 1 tablet (200 mg total) by mouth 2 (two) times daily after a meal. For 7 Days, then decrease to 200 mg daily   aspirin 325 MG EC tablet Take 1 tablet (325 mg total) by mouth daily.   atorvastatin 80 MG tablet Commonly known as:  LIPITOR Take 1 tablet (80 mg total) by mouth daily at 6 PM.   carvedilol 3.125 MG tablet Commonly known as:  COREG Take 1 tablet (3.125 mg total) by mouth 2 (two) times daily with a meal.   furosemide 40 MG tablet Commonly known as:  LASIX Take 1 tablet (40 mg total) by mouth daily. For 7 days   gabapentin 100 MG capsule Commonly known as:  NEURONTIN Take 1 capsule (100 mg total) by mouth daily.   glipiZIDE 10 MG tablet Commonly known as:  GLUCOTROL Take 1 tablet (10 mg total) by mouth daily before breakfast. Start taking on:  04/11/2016   oxyCODONE 5 MG immediate release tablet Commonly known as:  Oxy IR/ROXICODONE Take 1-2 tablets (5-10 mg total) by mouth every 3 (three) hours as needed for severe pain.   potassium chloride SA 20 MEQ  tablet Commonly known as:  K-DUR,KLOR-CON Take 1 tablet (20 mEq total) by mouth daily. For 7 Days   tamsulosin 0.4 MG Caps capsule Commonly known as:  FLOMAX Take 1 capsule (0.4 mg total) by mouth daily after supper.            Durable Medical Equipment        Start     Ordered   04/10/16 1239  For home use only DME Eelevated commode seat  Once     04/10/16 1238   04/10/16 1239  For home use only DME Shower stool  Once     04/10/16 1238   04/10/16 1238  For home use only DME Walker rolling  Once    Question:  Patient needs a walker to treat with the following condition  Answer:  Physical deconditioning   04/10/16 1238     Follow-up Information    [name], MD Follow up on 05/09/2016.   Specialty:  Cardiothoracic Surgery Why:  Appointment is at 9:45.  Please get CXR at 9:00 at [facility] located on first floor of our office building Contact information: [place] [address] [phone]        Lauree Chandler, MD Follow up.   Specialty:  Cardiology Why:  Please see discharge paperwork for follow-up appointment instructions. Contact information: [place] [address] [place] [address] [phone]        [facility] Follow up.   Specialty:  Home Health Services Why:  HHRN/PT/aide arranged- they will call to set up home visits Contact  information: [place] [address] [place] [address] [phone]        [name] Kerbs, DO Follow up.   Specialty:  Internal Medicine Why:  please set up hospital follow up at your convenience.  New diabetic Contact information: [address]  [place] [address] [facility] Follow up.   Why:  rolling walker, tub bench and 3n1 arranged- to be delivered to room prior to discharge Contact information: [address] [place] [address] [phone]           Signed: Ellwood Handler 04/10/2016, 1:54 PM

## 2016-04-07 NOTE — Progress Notes (Signed)
Physical Therapy Treatment Patient Details Name: Mark Cabrera MRN: [id number] DOB: April 09, 1950 Today's Date: April 28, 2016    History of Present Illness 66 yo s/p CABGx2. 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 Pt educated for mobility, HEp and continued progression. Will continue to follow.  HR 68-75  Follow Up Recommendations  Home health PT;Supervision/Assistance - 24 hour     Equipment Recommendations       Recommendations for Other Services       Precautions / Restrictions Precautions Precautions: Sternal;Fall    Mobility  Bed Mobility               General bed mobility comments: in chair on arrival  Transfers Overall transfer level: Needs assistance   Transfers: Sit to/from Stand Sit to Stand: Min guard         General transfer comment: cues for hand placement  Ambulation/Gait Ambulation/Gait assistance: Min guard Ambulation Distance (Feet): 450 Feet Assistive device: Rolling walker (2 wheeled) Gait Pattern/deviations: Step-through pattern;Decreased stride length;Trunk flexed   Gait velocity interpretation: Below normal speed for age/gender General Gait Details: cues for posture and position in RW, good control and stamina today   Stairs            Wheelchair Mobility    Modified Rankin (Stroke Patients Only)       Balance                                    Cognition Arousal/Alertness: Awake/alert Behavior During Therapy: Flat affect Overall Cognitive Status: Within Functional Limits for tasks assessed       Memory: Decreased recall of precautions         General Comments: pt able to recall 3/5 precautions    Exercises General Exercises - Lower Extremity Long Arc Quad: AROM;Both;Seated;10 reps Hip Flexion/Marching:  AROM;Both;Seated;10 reps    General Comments        Pertinent Vitals/Pain Pain Score: 5  Pain Location: right arm Pain Descriptors / Indicators: Sore Pain Intervention(s): Limited activity within patient's tolerance;Repositioned;Ice applied    Home Living                      Prior Function            PT Goals (current goals can now be found in the care plan section) Progress towards PT goals: Progressing toward goals    Frequency           PT Plan Current plan remains appropriate    Co-evaluation             End of Session Equipment Utilized During Treatment: Gait belt Activity Tolerance: Patient tolerated treatment well Patient left: in chair;with call bell/phone within reach;with chair alarm set     Time: [id number] PT Time Calculation (min) (ACUTE ONLY): 30 min  Charges:  $Gait Training: 23-37 mins                    G Codes:      Sayana Salley B Willys Salvino 04/28/2016, 11:34 AM Elwyn Reach, [place]

## 2016-04-07 NOTE — Progress Notes (Signed)
CARDIAC REHAB PHASE I   PRE:  Rate/Rhythm: 52 SR 1HB  BP:  Supine:   Sitting: 133/65  Standing:    SaO2: 98%RA  MODE:  Ambulation: 450 ft   POST:  Rate/Rhythm: 74 SR 1HB  BP:  Supine:   Sitting: 157/72  Standing:    SaO2: 99%RA 1330-1418 Entered room and pt stated he had choked on pill, call light out of wall and he had coughed and wet self. Plugged call light in, helped changed gowns and NT in to help stand pt as chair foot rest would not go down all the way. Chair removed from room. Pt walked 450 ft on RA with gait belt use, and rolling walker and asst x 1. Took a couple of standing rest breaks. To [facility] with call light and notified RN and NT.   Graylon Good, RN BSN  04/07/2016 2:12 PM

## 2016-04-07 NOTE — Progress Notes (Signed)
Pt resting well on nasal cannula not wanting to wear Bipap

## 2016-04-07 NOTE — Progress Notes (Signed)
Patient attempted to have BM and felt he had.  Bedside commode shows clear gel-like stool.  Will call MD to make aware. Payton Emerald, RN

## 2016-04-08 LAB — BASIC METABOLIC PANEL
ANION GAP: 9 (ref 5–15)
BUN: 21 mg/dL — ABNORMAL HIGH (ref 6–20)
CO2: 24 mmol/L (ref 22–32)
Calcium: 8.6 mg/dL — ABNORMAL LOW (ref 8.9–10.3)
Chloride: 99 mmol/L — ABNORMAL LOW (ref 101–111)
Creatinine, Ser: 1.14 mg/dL (ref 0.61–1.24)
Glucose, Bld: 148 mg/dL — ABNORMAL HIGH (ref 65–99)
Potassium: 5.2 mmol/L — ABNORMAL HIGH (ref 3.5–5.1)
SODIUM: 132 mmol/L — AB (ref 135–145)

## 2016-04-08 LAB — GLUCOSE, CAPILLARY
GLUCOSE-CAPILLARY: 145 mg/dL — AB (ref 65–99)
GLUCOSE-CAPILLARY: 347 mg/dL — AB (ref 65–99)
GLUCOSE-CAPILLARY: 182 mg/dL — AB (ref 65–99)
GLUCOSE-CAPILLARY: 134 mg/dL — AB (ref 65–99)
Glucose-Capillary: 127 mg/dL — ABNORMAL HIGH (ref 65–99)

## 2016-04-08 MED ORDER — LACTULOSE 10 GM/15ML PO SOLN
20.0000 g | Freq: Every day | ORAL | Status: DC | PRN
  Administered 2016-04-08: 20 g via ORAL
  Filled 2016-04-08: qty 30

## 2016-04-08 MED ORDER — FUROSEMIDE 40 MG PO TABS
40.0000 mg | ORAL_TABLET | Freq: Every day | ORAL | Status: DC
  Administered 2016-04-09 – 2016-04-10 (×2): 40 mg via ORAL
  Filled 2016-04-08 (×2): qty 1

## 2016-04-08 MED ORDER — POTASSIUM CHLORIDE CRYS ER 10 MEQ PO TBCR
10.0000 meq | EXTENDED_RELEASE_TABLET | Freq: Every day | ORAL | Status: DC
  Administered 2016-04-08 – 2016-04-10 (×3): 10 meq via ORAL
  Filled 2016-04-08 (×3): qty 1

## 2016-04-08 NOTE — Progress Notes (Signed)
Pt. not needing CPAP at this time, made aware to notify if needed.

## 2016-04-08 NOTE — Progress Notes (Signed)
Pt states that his brother, whom he was supposed to discharge with, has the flu along with his sister-in-law and that discharging to their place would not be the best plan at this time. Stated that the SW on the weekend mainly handles imminent discharges but will pass the information along.

## 2016-04-08 NOTE — Progress Notes (Signed)
EPW d/c'd per order and per protocol. Pt tolerated well. Tips intact. Pt educated on need for q83min vitals and bedrest x 1 hour. Call bell and phone within reach. Will continue to monitor.

## 2016-04-08 NOTE — Progress Notes (Signed)
CARDIAC REHAB PHASE I   Went to pt's room to walk him and he stated he was thirsty and needed his room cleaned.  I got him some water and called the nurse to clean his wound.  Will return at a later time today for ambulation.   Meta Hatchet, [place] 04/08/2016 8:02 AM

## 2016-04-08 NOTE — Progress Notes (Addendum)
CARDIAC REHAB PHASE I   PRE:  Rate/Rhythm: 61 SR  BP:  Sitting: 118/77       SaO2: 100 RA  MODE:  Ambulation: 400 ft   POST:  Rate/Rhythm: 71 SR  BP:  Sitting: 133/78        SaO2: 99 RA  Pt ambulated 462ft with walker and gait belt w/ little to no assist and tolerated well.  Pt wanted to go back to his room to use the restroom.  Pt returned to bedside commode with call bell in reach.  Will return later to educate pt for upcoming d/c. Discussed IS use, sternal precautions and restrictions.  Also discussed diet (DM control and diet), exercise and risk factors.  Will refer to [place].  Pt verbalized understanding.    Meta Hatchet, [place] 04/08/2016 9:33 AM

## 2016-04-08 NOTE — Progress Notes (Addendum)
Mark Cabrera       Mark Cabrera,Mark Cabrera [id number]             [phone]      9 Days Post-Op Procedure(s) (LRB): CORONARY ARTERY BYPASS GRAFTING (CABG) Times Two (N/A) AORTIC VALVE REPLACEMENT (AVR) (N/A) TRANSESOPHAGEAL ECHOCARDIOGRAM (TEE) (N/A) CLIPPING OF LEFT ATRIAL APPENDAGE (N/A) Subjective: feels a little better, right arm about the same  Objective: Vital signs in last 24 hours: Temp:  [98 F (36.7 C)-98.5 F (36.9 C)] 98 F (36.7 C) (01/27 0518) Pulse Rate:  [59-68] 59 (01/27 0518) Cardiac Rhythm: Heart block;Bundle branch block (01/26 1900) Resp:  [18-20] 20 (01/27 0518) BP: (104-119)/(61-68) 104/63 (01/27 0518) SpO2:  [94 %-96 %] 96 % (01/27 0518) Weight:  [233 lb (105.7 kg)] 233 lb (105.7 kg) (01/27 0518)  Hemodynamic parameters for last 24 hours:    Intake/Output from previous day: No intake/output data recorded. Intake/Output this shift: No intake/output data recorded.  General appearance: alert, cooperative and no distress Heart: regular rate and rhythm and soft systolic murmur Lungs: min dim in bases Abdomen: benign Extremities: minor edema Wound: incis healing well  Lab Results:  Recent Labs  04/07/16 0328  WBC 6.3  HGB 7.6*  HCT 23.0*  PLT 406*   BMET:  Recent Labs  04/07/16 0328 04/08/16 0324  NA 133* 132*  K 4.6 5.2*  CL 102 99*  CO2 22 24  GLUCOSE 127* 148*  BUN 27* 21*  CREATININE 1.12 1.14  CALCIUM 8.4* 8.6*    PT/INR: No results for input(s): LABPROT, INR in the last 72 hours. ABG    Component Value Date/Time   PHART 7.388 03/30/2016 1850   HCO3 21.8 03/30/2016 1850   TCO2 24 03/31/2016 1614   ACIDBASEDEF 3.0 (H) 03/30/2016 1850   O2SAT 99.0 03/30/2016 1850   CBG (last 3)   Recent Labs  04/07/16 1639 04/07/16 2128 04/08/16 0612  GLUCAP 110* 204* 145*    Meds Scheduled Meds: . amiodarone  400 mg Oral BID PC  . aspirin EC  325 mg Oral Daily  . atorvastatin  80 mg Oral q1800  . bisacodyl  10  mg Oral Daily   Or  . bisacodyl  10 mg Rectal Daily  . carvedilol  3.125 mg Oral BID WC  . chlorhexidine gluconate (MEDLINE KIT)  15 mL Mouth Rinse BID  . docusate sodium  200 mg Oral Daily  . enoxaparin (LOVENOX) injection  40 mg Subcutaneous QHS  . furosemide  40 mg Oral BID  . gabapentin  100 mg Oral Daily  . glipiZIDE  10 mg Oral QAC breakfast  . insulin aspart  0-20 Units Subcutaneous TID WC  . insulin aspart  0-5 Units Subcutaneous QHS  . magnesium citrate  0.5 Bottle Oral Once  . mouth rinse  15 mL Mouth Rinse BID  . pantoprazole  40 mg Oral Daily  . potassium chloride  20 mEq Oral BID  . sodium chloride flush  3 mL Intravenous Q12H  . sodium chloride flush  3 mL Intravenous Q12H  . tamsulosin  0.4 mg Oral QPC supper   Continuous Infusions: . sodium chloride Stopped (03/31/16 0600)   PRN Meds:.sodium chloride, ALPRAZolam, metoprolol, morphine injection, ondansetron (ZOFRAN) IV, oxyCODONE, sodium chloride flush, sodium chloride flush, traMADol  Xrays No results found.  Assessment/Plan: S/P Procedure(s) (LRB): CORONARY ARTERY BYPASS GRAFTING (CABG) Times Two (N/A) AORTIC VALVE REPLACEMENT (AVR) (N/A) TRANSESOPHAGEAL ECHOCARDIOGRAM (TEE) (N/A) CLIPPING OF LEFT ATRIAL APPENDAGE (N/A)  1 steady progress 2 hemodyn stable in sinus  3 cont diuresis- decrease to daily lasix- renal fxn good 4 d/c pacer wires 5 sugars adeq control- needs dietary management primarily 6 right arm stable- cont OT- will need at home 7 lactulose x 1 dose 8 cont rehab 9 home in next 1-2 days hopefully   LOS: 9 days    GOLD,WAYNE E 04/08/2016  patient says now can not go home with brother , has the flu now  I have seen and examined Mark Cabrera and agree with the above assessment  and plan.  Grace Isaac MD Beeper [phone] Office [phone] 04/08/2016 7:15 PM

## 2016-04-09 LAB — BASIC METABOLIC PANEL
ANION GAP: 9 (ref 5–15)
BUN: 20 mg/dL (ref 6–20)
CALCIUM: 8.5 mg/dL — AB (ref 8.9–10.3)
CO2: 23 mmol/L (ref 22–32)
Chloride: 100 mmol/L — ABNORMAL LOW (ref 101–111)
Creatinine, Ser: 1.21 mg/dL (ref 0.61–1.24)
Glucose, Bld: 138 mg/dL — ABNORMAL HIGH (ref 65–99)
POTASSIUM: 4.4 mmol/L (ref 3.5–5.1)
SODIUM: 132 mmol/L — AB (ref 135–145)

## 2016-04-09 LAB — GLUCOSE, CAPILLARY
GLUCOSE-CAPILLARY: 130 mg/dL — AB (ref 65–99)
GLUCOSE-CAPILLARY: 143 mg/dL — AB (ref 65–99)
GLUCOSE-CAPILLARY: 169 mg/dL — AB (ref 65–99)
Glucose-Capillary: 128 mg/dL — ABNORMAL HIGH (ref 65–99)

## 2016-04-09 NOTE — Progress Notes (Signed)
Call made to pt's brother Luciana Axe- who states that he is starting to feel better- reports no fever in last 3 days- states that at this time his wife is not sick- hopeful that she does not come down with the "cold/flu" that he had last week. Also spoke with pt's sister- Vonna [id number]- who has concerns about pt going to brothers home since he has been sick- both sister and brother asking about STSNF option- explained that pt was ambulating well and most likely would not meet the guidelines to go to STSNF- brother still open to pt coming to his home- will check back with brother in am as there is not another option for other family members to take pt.

## 2016-04-09 NOTE — Progress Notes (Signed)
Pt requested not be bothered as much as possible. Will try to clump care as much as possible. Will continue to follow.

## 2016-04-09 NOTE — Progress Notes (Addendum)
[address]       ,[place] [address]             [phone]      10 Days Post-Op Procedure(s) (LRB): CORONARY ARTERY BYPASS GRAFTING (CABG) Times Two (N/A) AORTIC VALVE REPLACEMENT (AVR) (N/A) TRANSESOPHAGEAL ECHOCARDIOGRAM (TEE) (N/A) CLIPPING OF LEFT ATRIAL APPENDAGE (N/A) Subjective: Feeling much better today  Objective: Vital signs in last 24 hours: Temp:  [97.5 F (36.4 C)-98.1 F (36.7 C)] 97.6 F (36.4 C) (01/28 0602) Pulse Rate:  [56-64] 64 (01/28 0809) Cardiac Rhythm: Normal sinus rhythm;Heart block (01/28 0701) Resp:  [18-20] 18 (01/28 0602) BP: (104-123)/(66-75) 104/67 (01/28 0809) SpO2:  [96 %-100 %] 98 % (01/28 0602) Weight:  [228 lb 12.8 oz (103.8 kg)] 228 lb 12.8 oz (103.8 kg) (01/28 0602)  Hemodynamic parameters for last 24 hours:    Intake/Output from previous day: 01/27 0701 - 01/28 0700 In: 480 [P.O.:480] Out: 1800 [Urine:1800] Intake/Output this shift: No intake/output data recorded.  General appearance: alert, cooperative and no distress Heart: regular rate and rhythm Lungs: mildly dim in bases Abdomen: benign Extremities: Min edema Wound: incis healing well  Lab Results:  Recent Labs  04/07/16 0328  WBC 6.3  HGB 7.6*  HCT 23.0*  PLT 406*   BMET:  Recent Labs  04/08/16 0324 04/09/16 0223  NA 132* 132*  K 5.2* 4.4  CL 99* 100*  CO2 24 23  GLUCOSE 148* 138*  BUN 21* 20  CREATININE 1.14 1.21  CALCIUM 8.6* 8.5*    PT/INR: No results for input(s): LABPROT, INR in the last 72 hours. ABG    Component Value Date/Time   PHART 7.388 03/30/2016 1850   HCO3 21.8 03/30/2016 1850   TCO2 24 03/31/2016 1614   ACIDBASEDEF 3.0 (H) 03/30/2016 1850   O2SAT 99.0 03/30/2016 1850   CBG (last 3)   Recent Labs  04/08/16 1714 04/08/16 2100 04/09/16 0600  GLUCAP 127* 134* 130*    Meds Scheduled Meds: . amiodarone  400 mg Oral BID PC  . aspirin EC  325 mg Oral Daily  . atorvastatin  80 mg Oral q1800  .  bisacodyl  10 mg Oral Daily   Or  . bisacodyl  10 mg Rectal Daily  . carvedilol  3.125 mg Oral BID WC  . chlorhexidine gluconate (MEDLINE KIT)  15 mL Mouth Rinse BID  . docusate sodium  200 mg Oral Daily  . enoxaparin (LOVENOX) injection  40 mg Subcutaneous QHS  . furosemide  40 mg Oral Daily  . gabapentin  100 mg Oral Daily  . glipiZIDE  10 mg Oral QAC breakfast  . insulin aspart  0-20 Units Subcutaneous TID WC  . insulin aspart  0-5 Units Subcutaneous QHS  . magnesium citrate  0.5 Bottle Oral Once  . mouth rinse  15 mL Mouth Rinse BID  . pantoprazole  40 mg Oral Daily  . potassium chloride  10 mEq Oral Daily  . sodium chloride flush  3 mL Intravenous Q12H  . sodium chloride flush  3 mL Intravenous Q12H  . tamsulosin  0.4 mg Oral QPC supper   Continuous Infusions: . sodium chloride Stopped (03/31/16 0600)   PRN Meds:.sodium chloride, ALPRAZolam, lactulose, metoprolol, morphine injection, ondansetron (ZOFRAN) IV, oxyCODONE, sodium chloride flush, sodium chloride flush, traMADol  Xrays No results found.  Assessment/Plan: S/P Procedure(s) (LRB): CORONARY ARTERY BYPASS GRAFTING (CABG) Times Two (N/A) AORTIC VALVE REPLACEMENT (AVR) (N/A) TRANSESOPHAGEAL ECHOCARDIOGRAM (TEE) (N/A) CLIPPING OF LEFT  ATRIAL APPENDAGE (N/A)  1 conts to do well 2 no where to safely discharge currently as brother has flu/pneumonia 3 labs/CBG pretty stable  4 sinus rhythm/hemodyn stable  LOS: 10 days    GOLD,WAYNE E 04/09/2016  Home when has place to go I have seen and examined [name] and agree with the above assessment  and plan.  Grace Isaac MD Beeper [phone] Office [phone] 04/09/2016 11:34 AM

## 2016-04-10 LAB — GLUCOSE, CAPILLARY
Glucose-Capillary: 147 mg/dL — ABNORMAL HIGH (ref 65–99)
Glucose-Capillary: 151 mg/dL — ABNORMAL HIGH (ref 65–99)

## 2016-04-10 MED ORDER — AMIODARONE HCL 200 MG PO TABS: 200.0000 mg | ORAL_TABLET | Freq: Two times a day (BID) | ORAL | 1 refills | Status: DC

## 2016-04-10 MED ORDER — OXYCODONE HCL 5 MG PO TABS: 5.0000 mg | ORAL_TABLET | ORAL | 0 refills | Status: DC | PRN

## 2016-04-10 MED ORDER — TAMSULOSIN HCL 0.4 MG PO CAPS: 0.4000 mg | ORAL_CAPSULE | Freq: Every day | ORAL | 3 refills | Status: DC

## 2016-04-10 MED ORDER — POTASSIUM CHLORIDE CRYS ER 20 MEQ PO TBCR: 20.0000 meq | EXTENDED_RELEASE_TABLET | Freq: Every day | ORAL | 0 refills | Status: DC

## 2016-04-10 MED ORDER — GLIPIZIDE 10 MG PO TABS: 10.0000 mg | ORAL_TABLET | Freq: Every day | ORAL | 3 refills | Status: DC

## 2016-04-10 MED ORDER — FUROSEMIDE 40 MG PO TABS: 40.0000 mg | ORAL_TABLET | Freq: Every day | ORAL | 0 refills | Status: DC

## 2016-04-10 MED ORDER — CARVEDILOL 3.125 MG PO TABS: 3.1250 mg | ORAL_TABLET | Freq: Two times a day (BID) | ORAL | 3 refills | Status: DC

## 2016-04-10 MED ORDER — ASPIRIN 325 MG PO TBEC: 325.0000 mg | DELAYED_RELEASE_TABLET | Freq: Every day | ORAL | 0 refills | Status: DC

## 2016-04-10 NOTE — Care Management Important Message (Signed)
Important Message  Patient Details  Name: Mark Cabrera MRN: [id number] Date of Birth: [birth year]   Medicare Important Message Given:  Yes    [name] 04/10/2016, 1:10 PM

## 2016-04-10 NOTE — Progress Notes (Signed)
CARDIAC REHAB PHASE I   PRE:  Rate/Rhythm: 73 SR  BP:  Supine: 130/69  Sitting:   Standing:    SaO2: 99%RA  MODE:  Ambulation: 490 ft   POST:  Rate/Rhythm: 72 SR  BP:  Supine:   Sitting: 150/87  Standing:    SaO2: 99%RA 1020-1102 Pt doing so much better today. He stood without help. Wanted to do on his own. Pt walked and talked the whole way. He walked 490 ft on RA with rolling walker with little assistance. To recliner with call bell. Tolerated well.    Graylon Good, RN BSN  04/10/2016 10:58 AM

## 2016-04-10 NOTE — Progress Notes (Addendum)
PT Cancellation Note  Patient Details Name: Mark Cabrera MRN: [id number] DOB: 18-Nov-1950   Cancelled Treatment:    Reason Eval/Treat Not Completed: Patient at procedure or test/unavailable. Pt ambulating with cardiac rehab in hallway.  Will check back later as schedule permits.  Addendum:  Checked back later on pt and he had just finished lunch. "I've walked twice already.  I'd like to just rest now." Per chart, pt may d/c later today. Will check back tomorrow.   Deshana Rominger LUBECK 04/10/2016, 10:43 AM

## 2016-04-10 NOTE — Progress Notes (Signed)
Discharged patient per MD order. IV removed, telemetry d/c, CCMD notified. Tele sitter d/c and notified. Assisted patient with dressing. AVS completed with patient including medications, follow up appts, and incisional care. Patient verbalized understanding. No further questions at this time. Took patient out with equipment. Patient to brothers house via car.  Cyndia Bent RN

## 2016-04-10 NOTE — Progress Notes (Signed)
Spoke with pt's Brother Luciana Axe this AM- he states he has been fever free for last 4 days- wife remains without illness. Brother states he is able to come pick pt up today and bring pt to his home. Pt has a place to stay- will need [facility] orders with Face 2 face HHRN/PT/OT/aide- and DME- RW, 3n1 and tub bench.

## 2016-04-10 NOTE — Care Management Important Message (Signed)
Important Message  Patient Details  Name: Mark Cabrera MRN: [id number] Date of Birth: [birth year]   Medicare Important Message Given:  Yes    [name] May 04/10/2016, 1:55 PM

## 2016-04-10 NOTE — Care Management Note (Signed)
Case Management Note Marvetta Gibbons RN, BSN Unit 2W-Case Manager [phone]  Patient Details  Name: Mark Cabrera MRN: [id number] Date of Birth: 1950/10/23  Subjective/Objective:    Pt admitted for planned CABG               Action/Plan: PTA pt lived in shed- per MD note with no electricity or heat- also has no bathroom. - CSW consulted for possible SNF placement post-op due to housing issues.   Expected Discharge Date:  04/10/16               Expected Discharge Plan:  [place]  In-House Referral:     Discharge planning Services  CM Consult  Post Acute Care Choice:  Home Health, Durable Medical Equipment Choice offered to:  Patient, Sibling  DME Arranged:  3-N-1, Tub bench, Walker rolling DME Agency:  [facility]:  RN, PT, Nurse's Aide [place] Agency:  [facility]  Status of Service:  Completed, signed off  If discussed at [place] of Stay Meetings, dates discussed:   1/23, 1/25  Discharge Disposition: home with home health   Additional Comments:  04/10/16- 1200- Wiletta Bermingham RN, CM- pt stable for d/c home today, does not have a skill-able need for STSNF- have spoken with pt's brother who remains fever free- and wife remains illness free- brother is able to come and pick pt up and take pt to his home- orders have been placed for [facility] and DME needs- notified Cory with [facility] for [facility]- and d/c - also notified Brad with [facility] for DME needs- RW/3n1/tub bench to be delivered to room prior to discharge.   04/07/16- 1530- Marvetta Gibbons RN, CM- PT/OT evals recommending HH therapies- will need HH orders with face 2 face prior to discharge- pt will also need orders for DME- RW, 3n1, and shower bench with back- spoke with pt at bedside- plan remains to go to his brothers home on discharge- pt gave permission to contact his brother- Gianpaolo Mcmullen- to confirm plans and address- contact # [phone]- call made to [facility]- and per conversation  confirmed that pt's brother is ok with pt coming to his home at discharge and having [facility] services- address at brother's home is- [address] [place], [place] did inform this CM that he has been sick with a "cold/flu" and diarhea over the last 3-4 day- has not been to the doctor and no confirmed flu and no prescription for tamiflu being taken. Brother states he is starting to feel better and hopes to be over whatever he has by the time pt ready to discharge (maybe first of next week). Spoke again with pt regarding [facility] services- choice offered and pt states he does not have a preference as long as insurance covers the services- will plan to use [facility]- referral called to [facility] with [facility]- CM will need orders prior to discharge for both [facility] and DME.    Per Elenor Quinones- RN, CM-Pt plans to discharge to his brothers home - brother and wife will be with patient 24/7 as recommended per pt.  CM was unable to contact brother to verify plan.   Dahlia Client [place], RN 04/10/2016, 12:45 PM [phone]

## 2016-04-10 NOTE — Clinical Social Work Note (Signed)
Pt discussed today in rounds. There are concerns about pt's disposition. Pt will be residing with a brother that has been ill with flu. It has been confirmed that pt's brother has been fever free for 48 hrs.  Pt has no other family or options for housing.  Pt does not meet criteria for SNF. Pt is ambulating too far and has no skill able needs at this time.  Brigett Estell B. Joline Maxcy Clinical Social Work Dept Weekend Social Worker [phone] 12:02 PM

## 2016-04-10 NOTE — Progress Notes (Addendum)
      [address]       [place] [address]             [phone]      11 Days Post-Op Procedure(s) (LRB): CORONARY ARTERY BYPASS GRAFTING (CABG) Times Two (N/A) AORTIC VALVE REPLACEMENT (AVR) (N/A) TRANSESOPHAGEAL ECHOCARDIOGRAM (TEE) (N/A) CLIPPING OF LEFT ATRIAL APPENDAGE (N/A)   Subjective:  No complaints.  Continues to feel better.  Objective: Vital signs in last 24 hours: Temp:  [98.2 F (36.8 C)-98.5 F (36.9 C)] 98.5 F (36.9 C) (01/29 0528) Pulse Rate:  [62-68] 68 (01/29 0528) Cardiac Rhythm: Normal sinus rhythm;Heart block (01/28 1900) Resp:  [18-19] 18 (01/29 0528) BP: (104-127)/(64-80) 126/68 (01/29 0528) SpO2:  [95 %-98 %] 98 % (01/29 0528) Weight:  [222 lb 3.2 oz (100.8 kg)] 222 lb 3.2 oz (100.8 kg) (01/29 0528)  Intake/Output from previous day: 01/28 0701 - 01/29 0700 In: 720 [P.O.:720] Out: 1950 [Urine:1950]  General appearance: alert, cooperative and no distress Heart: regular rate and rhythm Lungs: clear to auscultation bilaterally Abdomen: soft, non-tender; bowel sounds normal; no masses,  no organomegaly Extremities: edema trace Wound: clean and dry  Lab Results: No results for input(s): WBC, HGB, HCT, PLT in the last 72 hours. BMET:  Recent Labs  04/08/16 0324 04/09/16 0223  NA 132* 132*  K 5.2* 4.4  CL 99* 100*  CO2 24 23  GLUCOSE 148* 138*  BUN 21* 20  CREATININE 1.14 1.21  CALCIUM 8.6* 8.5*    PT/INR: No results for input(s): LABPROT, INR in the last 72 hours. ABG    Component Value Date/Time   PHART 7.388 03/30/2016 1850   HCO3 21.8 03/30/2016 1850   TCO2 24 03/31/2016 1614   ACIDBASEDEF 3.0 (H) 03/30/2016 1850   O2SAT 99.0 03/30/2016 1850   CBG (last 3)   Recent Labs  04/09/16 1653 04/09/16 2141 04/10/16 0634  GLUCAP 169* 128* 147*    Assessment/Plan: S/P Procedure(s) (LRB): CORONARY ARTERY BYPASS GRAFTING (CABG) Times Two (N/A) AORTIC VALVE REPLACEMENT (AVR) (N/A) TRANSESOPHAGEAL  ECHOCARDIOGRAM (TEE) (N/A) CLIPPING OF LEFT ATRIAL APPENDAGE (N/A)  1. CV- hemodynamically stable- continue Coreg, Amiodarone 2. Pulm- no acute issues, continue IS... Patient keeps applying oxygen when he feels like he needs it... SATS are ok 3. Renal- creatinine has been WNL, mild edema, weight continues to improve 4. DIspo- patient stable, ready for discharge when patient has place to go   LOS: 11 days    BARRETT, ERIN 04/10/2016  Patient medically stable and ready for discharge when disposition has been worked out [employer].D.

## 2016-04-11 ENCOUNTER — Other Ambulatory Visit: Payer: Self-pay

## 2016-04-11 DIAGNOSIS — M792 Neuralgia and neuritis, unspecified: Secondary | ICD-10-CM

## 2016-04-11 DIAGNOSIS — I350 Nonrheumatic aortic (valve) stenosis: Secondary | ICD-10-CM | POA: Diagnosis not present

## 2016-04-11 DIAGNOSIS — Z48812 Encounter for surgical aftercare following surgery on the circulatory system: Secondary | ICD-10-CM | POA: Diagnosis not present

## 2016-04-11 MED ORDER — GABAPENTIN 100 MG PO CAPS: 100.0000 mg | ORAL_CAPSULE | Freq: Every day | ORAL | 27 refills | Status: DC

## 2016-04-11 MED ORDER — GABAPENTIN 100 MG PO CAPS: 100.0000 mg | ORAL_CAPSULE | Freq: Every day | ORAL | 0 refills | Status: DC

## 2016-04-11 MED FILL — Sodium Bicarbonate IV Soln 8.4%: INTRAVENOUS | Qty: 50 | Status: AC

## 2016-04-11 MED FILL — Electrolyte-R (PH 7.4) Solution: INTRAVENOUS | Qty: 4000 | Status: AC

## 2016-04-11 MED FILL — Lidocaine HCl IV Inj 20 MG/ML: INTRAVENOUS | Qty: 5 | Status: AC

## 2016-04-11 MED FILL — Heparin Sodium (Porcine) Inj 1000 Unit/ML: INTRAMUSCULAR | Qty: 40 | Status: AC

## 2016-04-11 MED FILL — Sodium Chloride IV Soln 0.9%: INTRAVENOUS | Qty: 2000 | Status: AC

## 2016-04-11 MED FILL — Mannitol IV Soln 20%: INTRAVENOUS | Qty: 500 | Status: AC

## 2016-04-12 DIAGNOSIS — Z48812 Encounter for surgical aftercare following surgery on the circulatory system: Secondary | ICD-10-CM | POA: Diagnosis not present

## 2016-04-12 DIAGNOSIS — I350 Nonrheumatic aortic (valve) stenosis: Secondary | ICD-10-CM | POA: Diagnosis not present

## 2016-04-14 DIAGNOSIS — Z48812 Encounter for surgical aftercare following surgery on the circulatory system: Secondary | ICD-10-CM | POA: Diagnosis not present

## 2016-04-14 DIAGNOSIS — I350 Nonrheumatic aortic (valve) stenosis: Secondary | ICD-10-CM | POA: Diagnosis not present

## 2016-04-16 ENCOUNTER — Inpatient Hospital Stay (HOSPITAL_COMMUNITY)
Admission: EM | Admit: 2016-04-16 | Discharge: 2016-04-28 | DRG: 270 | Disposition: A | Discharge status: Home Health | Payer: Medicare Other | Attending: Cardiovascular Disease | Admitting: Cardiovascular Disease

## 2016-04-16 ENCOUNTER — Emergency Department (HOSPITAL_COMMUNITY): Payer: Medicare Other

## 2016-04-16 ENCOUNTER — Encounter (HOSPITAL_COMMUNITY): Payer: Self-pay | Admitting: Emergency Medicine

## 2016-04-16 DIAGNOSIS — K219 Gastro-esophageal reflux disease without esophagitis: Secondary | ICD-10-CM | POA: Diagnosis present

## 2016-04-16 DIAGNOSIS — J90 Pleural effusion, not elsewhere classified: Secondary | ICD-10-CM | POA: Diagnosis not present

## 2016-04-16 DIAGNOSIS — Z79899 Other long term (current) drug therapy: Secondary | ICD-10-CM

## 2016-04-16 DIAGNOSIS — I313 Pericardial effusion (noninflammatory): Secondary | ICD-10-CM | POA: Diagnosis present

## 2016-04-16 DIAGNOSIS — D500 Iron deficiency anemia secondary to blood loss (chronic): Secondary | ICD-10-CM | POA: Diagnosis present

## 2016-04-16 DIAGNOSIS — I5032 Chronic diastolic (congestive) heart failure: Secondary | ICD-10-CM | POA: Diagnosis present

## 2016-04-16 DIAGNOSIS — E785 Hyperlipidemia, unspecified: Secondary | ICD-10-CM | POA: Diagnosis present

## 2016-04-16 DIAGNOSIS — I2510 Atherosclerotic heart disease of native coronary artery without angina pectoris: Secondary | ICD-10-CM | POA: Diagnosis present

## 2016-04-16 DIAGNOSIS — I359 Nonrheumatic aortic valve disorder, unspecified: Secondary | ICD-10-CM | POA: Diagnosis not present

## 2016-04-16 DIAGNOSIS — I509 Heart failure, unspecified: Secondary | ICD-10-CM | POA: Diagnosis not present

## 2016-04-16 DIAGNOSIS — T380X5A Adverse effect of glucocorticoids and synthetic analogues, initial encounter: Secondary | ICD-10-CM | POA: Diagnosis not present

## 2016-04-16 DIAGNOSIS — I110 Hypertensive heart disease with heart failure: Secondary | ICD-10-CM | POA: Diagnosis present

## 2016-04-16 DIAGNOSIS — G4730 Sleep apnea, unspecified: Secondary | ICD-10-CM | POA: Diagnosis not present

## 2016-04-16 DIAGNOSIS — I472 Ventricular tachycardia: Secondary | ICD-10-CM | POA: Diagnosis not present

## 2016-04-16 DIAGNOSIS — I5033 Acute on chronic diastolic (congestive) heart failure: Secondary | ICD-10-CM | POA: Diagnosis not present

## 2016-04-16 DIAGNOSIS — G4731 Primary central sleep apnea: Secondary | ICD-10-CM | POA: Diagnosis present

## 2016-04-16 DIAGNOSIS — Z880 Allergy status to penicillin: Secondary | ICD-10-CM

## 2016-04-16 DIAGNOSIS — N179 Acute kidney failure, unspecified: Secondary | ICD-10-CM | POA: Diagnosis present

## 2016-04-16 DIAGNOSIS — Z9889 Other specified postprocedural states: Secondary | ICD-10-CM

## 2016-04-16 DIAGNOSIS — I4819 Other persistent atrial fibrillation: Secondary | ICD-10-CM

## 2016-04-16 DIAGNOSIS — I3139 Other pericardial effusion (noninflammatory): Secondary | ICD-10-CM | POA: Diagnosis present

## 2016-04-16 DIAGNOSIS — I481 Persistent atrial fibrillation: Secondary | ICD-10-CM | POA: Diagnosis not present

## 2016-04-16 DIAGNOSIS — Z7982 Long term (current) use of aspirin: Secondary | ICD-10-CM

## 2016-04-16 DIAGNOSIS — I480 Paroxysmal atrial fibrillation: Secondary | ICD-10-CM | POA: Diagnosis not present

## 2016-04-16 DIAGNOSIS — Z794 Long term (current) use of insulin: Secondary | ICD-10-CM

## 2016-04-16 DIAGNOSIS — R0602 Shortness of breath: Secondary | ICD-10-CM | POA: Diagnosis not present

## 2016-04-16 DIAGNOSIS — E1142 Type 2 diabetes mellitus with diabetic polyneuropathy: Secondary | ICD-10-CM | POA: Diagnosis present

## 2016-04-16 DIAGNOSIS — F329 Major depressive disorder, single episode, unspecified: Secondary | ICD-10-CM | POA: Diagnosis present

## 2016-04-16 DIAGNOSIS — J9811 Atelectasis: Secondary | ICD-10-CM | POA: Diagnosis present

## 2016-04-16 DIAGNOSIS — I5031 Acute diastolic (congestive) heart failure: Secondary | ICD-10-CM | POA: Diagnosis present

## 2016-04-16 DIAGNOSIS — Z8673 Personal history of transient ischemic attack (TIA), and cerebral infarction without residual deficits: Secondary | ICD-10-CM | POA: Diagnosis not present

## 2016-04-16 DIAGNOSIS — Z951 Presence of aortocoronary bypass graft: Secondary | ICD-10-CM | POA: Diagnosis not present

## 2016-04-16 DIAGNOSIS — E1349 Other specified diabetes mellitus with other diabetic neurological complication: Secondary | ICD-10-CM

## 2016-04-16 DIAGNOSIS — I454 Nonspecific intraventricular block: Secondary | ICD-10-CM | POA: Diagnosis present

## 2016-04-16 DIAGNOSIS — I319 Disease of pericardium, unspecified: Secondary | ICD-10-CM | POA: Diagnosis not present

## 2016-04-16 DIAGNOSIS — Z952 Presence of prosthetic heart valve: Secondary | ICD-10-CM | POA: Diagnosis not present

## 2016-04-16 DIAGNOSIS — R079 Chest pain, unspecified: Secondary | ICD-10-CM | POA: Diagnosis not present

## 2016-04-16 DIAGNOSIS — I241 Dressler's syndrome: Secondary | ICD-10-CM | POA: Diagnosis not present

## 2016-04-16 DIAGNOSIS — Z7984 Long term (current) use of oral hypoglycemic drugs: Secondary | ICD-10-CM

## 2016-04-16 DIAGNOSIS — R0682 Tachypnea, not elsewhere classified: Secondary | ICD-10-CM | POA: Diagnosis not present

## 2016-04-16 DIAGNOSIS — Z953 Presence of xenogenic heart valve: Secondary | ICD-10-CM | POA: Diagnosis not present

## 2016-04-16 DIAGNOSIS — I309 Acute pericarditis, unspecified: Secondary | ICD-10-CM | POA: Diagnosis not present

## 2016-04-16 DIAGNOSIS — E876 Hypokalemia: Secondary | ICD-10-CM | POA: Diagnosis not present

## 2016-04-16 DIAGNOSIS — R846 Abnormal cytological findings in specimens from respiratory organs and thorax: Secondary | ICD-10-CM | POA: Diagnosis not present

## 2016-04-16 HISTORY — DX: Unspecified atrial fibrillation: I48.91

## 2016-04-16 LAB — BASIC METABOLIC PANEL
Anion gap: 14 (ref 5–15)
BUN: 22 mg/dL — AB (ref 6–20)
CO2: 22 mmol/L (ref 22–32)
Calcium: 9.6 mg/dL (ref 8.9–10.3)
Chloride: 99 mmol/L — ABNORMAL LOW (ref 101–111)
Creatinine, Ser: 1.32 mg/dL — ABNORMAL HIGH (ref 0.61–1.24)
GFR, EST NON AFRICAN AMERICAN: 54 mL/min — AB (ref >60)
GLUCOSE: 167 mg/dL — AB (ref 65–99)
Potassium: 3.9 mmol/L (ref 3.5–5.1)
SODIUM: 135 mmol/L (ref 135–145)

## 2016-04-16 LAB — CBC
HEMATOCRIT: 26.6 % — AB (ref 39.0–52.0)
HEMOGLOBIN: 8.6 g/dL — AB (ref 13.0–17.0)
MCH: 26.7 pg (ref 26.0–34.0)
MCHC: 32.3 g/dL (ref 30.0–36.0)
MCV: 82.6 fL (ref 78.0–100.0)
Platelets: 418 10*3/uL — ABNORMAL HIGH (ref 150–400)
RBC: 3.22 MIL/uL — ABNORMAL LOW (ref 4.22–5.81)
RDW: 14.5 % (ref 11.5–15.5)
WBC: 8.6 10*3/uL (ref 4.0–10.5)

## 2016-04-16 LAB — I-STAT TROPONIN, ED: Troponin i, poc: 0.09 ng/mL (ref 0.00–0.08)

## 2016-04-16 LAB — BRAIN NATRIURETIC PEPTIDE: B Natriuretic Peptide: 612.8 pg/mL — ABNORMAL HIGH (ref 0.0–100.0)

## 2016-04-16 MED ORDER — GLIPIZIDE 5 MG PO TABS
10.0000 mg | ORAL_TABLET | Freq: Every day | ORAL | Status: DC
  Administered 2016-04-17 – 2016-04-22 (×5): 10 mg via ORAL
  Filled 2016-04-16 (×2): qty 1
  Filled 2016-04-16: qty 2
  Filled 2016-04-16 (×4): qty 1

## 2016-04-16 MED ORDER — HEPARIN BOLUS VIA INFUSION
2000.0000 [IU] | Freq: Once | INTRAVENOUS | Status: AC
  Administered 2016-04-16: 2000 [IU] via INTRAVENOUS
  Filled 2016-04-16: qty 2000

## 2016-04-16 MED ORDER — OXYCODONE HCL 5 MG PO TABS
5.0000 mg | ORAL_TABLET | ORAL | Status: DC | PRN
  Administered 2016-04-18 – 2016-04-20 (×2): 10 mg via ORAL
  Filled 2016-04-16 (×2): qty 2
  Filled 2016-04-16: qty 1

## 2016-04-16 MED ORDER — SODIUM CHLORIDE 0.9% FLUSH
3.0000 mL | Freq: Two times a day (BID) | INTRAVENOUS | Status: DC
  Administered 2016-04-16 – 2016-04-28 (×21): 3 mL via INTRAVENOUS

## 2016-04-16 MED ORDER — CARVEDILOL 3.125 MG PO TABS: 3.1250 mg | ORAL_TABLET | Freq: Two times a day (BID) | ORAL | Status: DC

## 2016-04-16 MED ORDER — AMIODARONE HCL 200 MG PO TABS
200.0000 mg | ORAL_TABLET | Freq: Two times a day (BID) | ORAL | Status: DC
  Administered 2016-04-17 – 2016-04-23 (×12): 200 mg via ORAL
  Filled 2016-04-16 (×12): qty 1

## 2016-04-16 MED ORDER — ONDANSETRON HCL 4 MG/2ML IJ SOLN
4.0000 mg | Freq: Four times a day (QID) | INTRAMUSCULAR | Status: DC | PRN
  Administered 2016-04-24 – 2016-04-26 (×2): 4 mg via INTRAVENOUS
  Filled 2016-04-16 (×3): qty 2

## 2016-04-16 MED ORDER — TAMSULOSIN HCL 0.4 MG PO CAPS
0.4000 mg | ORAL_CAPSULE | Freq: Every day | ORAL | Status: DC
  Administered 2016-04-17 – 2016-04-28 (×11): 0.4 mg via ORAL
  Filled 2016-04-16 (×11): qty 1

## 2016-04-16 MED ORDER — ATORVASTATIN CALCIUM 80 MG PO TABS
80.0000 mg | ORAL_TABLET | Freq: Every day | ORAL | Status: DC
  Filled 2016-04-16 (×2): qty 1

## 2016-04-16 MED ORDER — INSULIN ASPART 100 UNIT/ML ~~LOC~~ SOLN: 0.0000 [IU] | Freq: Three times a day (TID) | SUBCUTANEOUS | Status: DC

## 2016-04-16 MED ORDER — FUROSEMIDE 10 MG/ML IJ SOLN
40.0000 mg | Freq: Three times a day (TID) | INTRAMUSCULAR | Status: DC
  Administered 2016-04-17 – 2016-04-21 (×13): 40 mg via INTRAVENOUS
  Filled 2016-04-16 (×13): qty 4

## 2016-04-16 MED ORDER — FUROSEMIDE 10 MG/ML IJ SOLN
20.0000 mg | Freq: Once | INTRAMUSCULAR | Status: AC
  Administered 2016-04-16: 20 mg via INTRAVENOUS
  Filled 2016-04-16: qty 2

## 2016-04-16 MED ORDER — SODIUM CHLORIDE 0.9 % IV SOLN: 250.0000 mL | INTRAVENOUS | Status: DC | PRN

## 2016-04-16 MED ORDER — ACETAMINOPHEN 325 MG PO TABS
650.0000 mg | ORAL_TABLET | ORAL | Status: DC | PRN
  Administered 2016-04-18 – 2016-04-20 (×5): 650 mg via ORAL
  Filled 2016-04-16 (×5): qty 2

## 2016-04-16 MED ORDER — ASPIRIN EC 325 MG PO TBEC
325.0000 mg | DELAYED_RELEASE_TABLET | Freq: Every day | ORAL | Status: DC
  Administered 2016-04-17 – 2016-04-28 (×11): 325 mg via ORAL
  Filled 2016-04-16 (×11): qty 1

## 2016-04-16 MED ORDER — SODIUM CHLORIDE 0.9% FLUSH: 3.0000 mL | INTRAVENOUS | Status: DC | PRN

## 2016-04-16 MED ORDER — HEPARIN (PORCINE) IN NACL 100-0.45 UNIT/ML-% IJ SOLN
1450.0000 [IU]/h | INTRAMUSCULAR | Status: DC
  Administered 2016-04-16: 1250 [IU]/h via INTRAVENOUS
  Administered 2016-04-17 – 2016-04-20 (×5): 1450 [IU]/h via INTRAVENOUS
  Filled 2016-04-16 (×6): qty 250

## 2016-04-16 NOTE — Progress Notes (Signed)
ANTICOAGULATION CONSULT NOTE - Initial Consult  Pharmacy Consult for heparin  Indication: atrial fibrillation  Allergies  Allergen Reactions  . Crab (Diagnostic) Anaphylaxis  . Penicillins Other (See Comments)    Has patient had a PCN reaction causing immediate rash, facial/tongue/throat swelling, SOB or lightheadedness with hypotension: Unk Has patient had a PCN reaction causing severe rash involving mucus membranes or skin necrosis: Unk Has patient had a PCN reaction that required hospitalization: Unk Has patient had a PCN reaction occurring within the last 10 years: No If all of the above answers are "NO", then may proceed with Cephalosporin use.     Patient Measurements: Height: 5\' 8"  (172.7 cm) Weight: 226 lb (102.5 kg) IBW/kg (Calculated) : 68.4 Heparin Dosing Weight: 90kg  Vital Signs: Temp: 97.7 F (36.5 C) (02/04 1655) BP: 110/55 (02/04 2130) Pulse Rate: 70 (02/04 2130)  Labs:  Recent Labs  04/16/16 1710  HGB 8.6*  HCT 26.6*  PLT 418*  CREATININE 1.32*    Estimated Creatinine Clearance: 63 mL/min (by C-G formula based on SCr of 1.32 mg/dL (H)).   Medical History: Past Medical History:  Diagnosis Date  . Acute diastolic congestive heart failure ([place])   . Aortic valve stenosis   . Atypical nevi   . Coronary artery disease   . Depression    "years ago"  . Diabetes mellitus   . GERD (gastroesophageal reflux disease)   . History of kidney stones   . Hyperlipidemia    hx of transaminitis secondary to statin and he has decided not to use statins secondary to potential side effects.  . Hypertension   . Nephrolithiasis   . Osteoarthritis, knee   . Sleep apnea     Central apnea. Not using cpap  . Stroke [facility]) 2007  . Transaminitis     Statin-induced  . 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 MRI of the brain 2007 showed acute left lateral medullary infarct in the distribution of left  posterior inferior cerebral artery , narrowing of the left vertebral with severe diminution of flow or acute occlusion. 2-D echo was normal no embolic source found.    Medications:  Prescriptions Prior to Admission  Medication Sig Dispense Refill Last Dose  . amiodarone (PACERONE) 200 MG tablet Take 1 tablet (200 mg total) by mouth 2 (two) times daily after a meal. For 7 Days, then decrease to 200 mg daily 60 tablet 1 04/16/2016 at 0900  . Ascorbic Acid (VITAMIN C PO) Take 1 tablet by mouth daily.   Past Week at Unknown time  . aspirin EC 325 MG EC tablet Take 1 tablet (325 mg total) by mouth daily. 30 tablet 0 04/16/2016 at 0800  . carvedilol (COREG) 3.125 MG tablet Take 1 tablet (3.125 mg total) by mouth 2 (two) times daily with a meal. 60 tablet 3 04/16/2016 at 0900  . furosemide (LASIX) 40 MG tablet Take 1 tablet (40 mg total) by mouth daily. For 7 days 7 tablet 0 04/16/2016 at 1200  . gabapentin (NEURONTIN) 100 MG capsule Take 1 capsule (100 mg total) by mouth daily. 30 capsule 0 04/16/2016 at am  . glipiZIDE (GLUCOTROL) 10 MG tablet Take 1 tablet (10 mg total) by mouth daily before breakfast. 30 tablet 3 04/16/2016 at am  . ibuprofen (ADVIL,MOTRIN) 200 MG tablet Take 200 mg by mouth every 6 (six) hours as needed for headache or mild pain (or swelling).   Past Week at Unknown  time  . metFORMIN (GLUCOPHAGE-XR) 500 MG 24 hr tablet Take 1,000 mg by mouth 2 (two) times daily.   04/16/2016 at am  . oxyCODONE (OXY IR/ROXICODONE) 5 MG immediate release tablet Take 1-2 tablets (5-10 mg total) by mouth every 3 (three) hours as needed for severe pain. 30 tablet 0 04/15/2016 at pm  . potassium chloride (K-DUR,KLOR-CON) 20 MEQ tablet Take 1 tablet (20 mEq total) by mouth daily. For 7 Days 7 tablet 0 04/16/2016 at am  . tamsulosin (FLOMAX) 0.4 MG CAPS capsule Take 1 capsule (0.4 mg total) by mouth daily after supper. 30 capsule 3 04/16/2016 at am  . atorvastatin (LIPITOR) 80 MG tablet Take 1 tablet (80 mg total) by mouth  daily at 6 PM. (Patient not taking: Reported on 04/16/2016) 90 tablet 3 Not Taking at Unknown time   Scheduled:  . [START ON 04/17/2016] amiodarone  200 mg Oral BID PC  . [START ON 04/17/2016] aspirin  325 mg Oral Daily  . [START ON 04/17/2016] atorvastatin  80 mg Oral q1800  . [START ON 04/17/2016] carvedilol  3.125 mg Oral BID WC  . furosemide  40 mg Intravenous Q8H  . [START ON 04/17/2016] glipiZIDE  10 mg Oral QAC breakfast  . [START ON 04/17/2016] insulin aspart  0-15 Units Subcutaneous TID WC  . sodium chloride flush  3 mL Intravenous Q12H  . [START ON 04/17/2016] tamsulosin  0.4 mg Oral QPC supper    Assessment: 66 yo male s/p AVR (bioprosthetic) on 03/30/16 here for SOB and noted with afib. Pharmacy consulted to dose heparin. Noted for likely po anticoagulation. -Hg= 8.6 (recently 7.6-8.7 in 03/2016)  Goal of Therapy:  Heparin level 0.3-0.7 units/ml Monitor platelets by anticoagulation protocol: Yes   Plan:  -Heparin bolus 2000 units IV followed by 1250 units/hr (~ units/kg/hr) -Heparin level in 6 hours and daily wth CBC daily  Hildred Laser, Pharm D 04/16/2016 10:18 PM

## 2016-04-16 NOTE — ED Provider Notes (Signed)
[facility] DEPT Provider Note   CSN: [id number] Arrival date & time: 04/16/16  1649     History   Chief Complaint Chief Complaint  Patient presents with  . Shortness of Breath  . Leg Swelling    HPI Mark Cabrera is a 66 y.o. male.  The history is provided by the patient. No language interpreter was used.  Shortness of Breath    Mark Cabrera is a 66 y.o. male who presents to the Emergency Department complaining of sob.  He is 2 weeks postop from CABG and aortic valve replacement. He was discharged home 1 week ago. Over the last 2 days he began to feel more dizzy with generalized weakness and a puny-type feeling. He has significant shortness of breath. Over the last day he's developed significant bilateral lower extremity edema. No reports of fevers, cough. He has what he describes as appropriate chest pain following surgery. He is urinating but less than usual. Symptoms are severe, constant, worsening.His last Lasix dose was today. Past Medical History:  Diagnosis Date  . Acute diastolic congestive heart failure ([place])   . Aortic valve stenosis   . Atypical nevi   . Coronary artery disease   . Depression    "years ago"  . Diabetes mellitus   . GERD (gastroesophageal reflux disease)   . History of kidney stones   . Hyperlipidemia    hx of transaminitis secondary to statin and he has decided not to use statins secondary to potential side effects.  . Hypertension   . Nephrolithiasis   . Osteoarthritis, knee   . Sleep apnea     Central apnea. Not using cpap  . Stroke [facility]) 2007  . Transaminitis     Statin-induced  . 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 2-D echo was normal no embolic source  found.    Patient Active Problem List   Diagnosis Date Noted  . Acute on chronic diastolic heart failure ([place]) 04/16/2016  . Persistent atrial fibrillation ([place]) 04/16/2016  . Anemia due to blood loss 04/16/2016  . 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  . CAD (coronary artery disease), native coronary artery   . Acute diastolic CHF (congestive heart failure) ([place])   . Sleep apnea 08/02/2009  . Diabetes mellitus with neurological manifestation ([place]) 10/18/2007  . Dyslipidemia 05/18/2006  . Hypertensive heart disease   . History of dissection of vertebral artery  ([place]) 08/02/2005    Past Surgical History:  Procedure Laterality Date  . AORTIC VALVE REPLACEMENT N/A 03/30/2016   Procedure: AORTIC VALVE REPLACEMENT (AVR);  Surgeon: [name], MD;  Location: [place];  Service: Open Heart Surgery;  Laterality: N/A;  . CARDIAC CATHETERIZATION N/A 03/15/2016   Procedure: Right/Left Heart Cath and Coronary Angiography;  Surgeon: Burnell Blanks, MD;  Location: [place] CV LAB;  Service: Cardiovascular;  Laterality: N/A;  . CLIPPING OF ATRIAL APPENDAGE N/A 03/30/2016   Procedure: CLIPPING OF LEFT ATRIAL APPENDAGE;  Surgeon: [name], MD;  Location: [place];  Service: Open Heart Surgery;  Laterality: N/A;  . CORONARY ARTERY BYPASS GRAFT N/A 03/30/2016   Procedure: CORONARY ARTERY BYPASS GRAFTING (CABG) Times Two;  Surgeon: Remo Lipps  Chaya Jan, MD;  Location: [place];  Service: Open Heart Surgery;  Laterality: N/A;  . KNEE ARTHROSCOPY W/ PARTIAL MEDIAL MENISCECTOMY  05/12/2005   right, performed by Dr. French Ana for torn medial meniscus.  Marland Kitchen ROTATOR CUFF REPAIR Right 2016  . TEE WITHOUT CARDIOVERSION N/A 03/30/2016   Procedure: TRANSESOPHAGEAL ECHOCARDIOGRAM (TEE);  Surgeon: [name], MD;  Location: [facility];  Service: Open Heart Surgery;  Laterality: N/A;       Home Medications    Prior to Admission medications     Medication Sig Start Date End Date Taking? Authorizing Provider  amiodarone (PACERONE) 200 MG tablet Take 1 tablet (200 mg total) by mouth 2 (two) times daily after a meal. For 7 Days, then decrease to 200 mg daily 04/10/16  Yes Erin R Barrett, PA-C  Ascorbic Acid (VITAMIN C PO) Take 1 tablet by mouth daily.   Yes Historical Provider, MD  aspirin EC 325 MG EC tablet Take 1 tablet (325 mg total) by mouth daily. 04/10/16  Yes Erin R Barrett, PA-C  carvedilol (COREG) 3.125 MG tablet Take 1 tablet (3.125 mg total) by mouth 2 (two) times daily with a meal. 04/10/16  Yes Erin R Barrett, PA-C  furosemide (LASIX) 40 MG tablet Take 1 tablet (40 mg total) by mouth daily. For 7 days 04/10/16  Yes Erin R Barrett, PA-C  gabapentin (NEURONTIN) 100 MG capsule Take 1 capsule (100 mg total) by mouth daily. 04/11/16 05/11/16 Yes [name], MD  glipiZIDE (GLUCOTROL) 10 MG tablet Take 1 tablet (10 mg total) by mouth daily before breakfast. 04/11/16  Yes Erin R Barrett, PA-C  ibuprofen (ADVIL,MOTRIN) 200 MG tablet Take 200 mg by mouth every 6 (six) hours as needed for headache or mild pain (or swelling).   Yes Historical Provider, MD  metFORMIN (GLUCOPHAGE-XR) 500 MG 24 hr tablet Take 1,000 mg by mouth 2 (two) times daily. 03/08/16  Yes Historical Provider, MD  oxyCODONE (OXY IR/ROXICODONE) 5 MG immediate release tablet Take 1-2 tablets (5-10 mg total) by mouth every 3 (three) hours as needed for severe pain. 04/10/16  Yes Erin R Barrett, PA-C  potassium chloride (K-DUR,KLOR-CON) 20 MEQ tablet Take 1 tablet (20 mEq total) by mouth daily. For 7 Days 04/10/16  Yes Erin R Barrett, PA-C  tamsulosin (FLOMAX) 0.4 MG CAPS capsule Take 1 capsule (0.4 mg total) by mouth daily after supper. 04/10/16  Yes Erin R Barrett, PA-C  atorvastatin (LIPITOR) 80 MG tablet Take 1 tablet (80 mg total) by mouth daily at 6 PM. Patient not taking: Reported on 04/16/2016 03/18/16   Erlene Quan, PA-C    Family History Family History  Problem  Relation Age of Onset  . Hypertension Mother   . Diabetes Mother   . Alcohol abuse Father     Social History Social History  Substance Use Topics  . Smoking status: Never Smoker  . Smokeless tobacco: Never Used  . Alcohol use Yes     Comment: occasional     Allergies   Crab (diagnostic) and Penicillins   Review of Systems Review of Systems  Respiratory: Positive for shortness of breath.   All other systems reviewed and are negative.    Physical Exam Updated Vital Signs BP 120/72 (BP Location: Right Arm)   Pulse 72   Temp 98.3 F (36.8 C) (Oral)   Resp 15   Ht 5\' 8"  (1.727 m)   Wt 227 lb 14.4 oz (103.4 kg) Comment: Scale B  SpO2 94%  BMI 34.65 kg/m   Physical Exam  Constitutional: He is oriented to person, place, and time. He appears well-developed and well-nourished.  HENT:  Head: Normocephalic and atraumatic.  Cardiovascular:  No murmur heard. Irregular rhythm with systolic ejection murmur.  Pulmonary/Chest: Effort normal. No respiratory distress.  Tachypnea with decreased air movement in the left lung base. There is a healing midline sternal incision.  Abdominal: Soft. There is no tenderness. There is no rebound and no guarding.  Musculoskeletal:  Tense edema to bilateral lower extremities, right greater than left  Neurological: He is alert and oriented to person, place, and time.  Skin: Skin is warm and dry.  Psychiatric: He has a normal mood and affect. His behavior is normal.  Nursing note and vitals reviewed.    ED Treatments / Results  Labs (all labs ordered are listed, but only abnormal results are displayed) Labs Reviewed  BASIC METABOLIC PANEL - Abnormal; Notable for the following:       Result Value   Chloride 99 (*)    Glucose, Bld 167 (*)    BUN 22 (*)    Creatinine, Ser 1.32 (*)    GFR calc non Af Amer 54 (*)    All other components within normal limits  CBC - Abnormal; Notable for the following:    RBC 3.22 (*)    Hemoglobin 8.6  (*)    HCT 26.6 (*)    Platelets 418 (*)    All other components within normal limits  BRAIN NATRIURETIC PEPTIDE - Abnormal; Notable for the following:    B Natriuretic Peptide 612.8 (*)    All other components within normal limits  APTT - Abnormal; Notable for the following:    aPTT 58 (*)    All other components within normal limits  PROTIME-INR - Abnormal; Notable for the following:    Prothrombin Time 16.4 (*)    All other components within normal limits  GLUCOSE, CAPILLARY - Abnormal; Notable for the following:    Glucose-Capillary 163 (*)    All other components within normal limits  I-STAT TROPOININ, ED - Abnormal; Notable for the following:    Troponin i, poc 0.09 (*)    All other components within normal limits  TSH  HEMOGLOBIN A1C  HEPARIN LEVEL (UNFRACTIONATED)  BASIC METABOLIC PANEL  CBC    EKG  EKG Interpretation  Date/Time:  Sunday April 16 2016 16:55:19 EST Ventricular Rate:  89 PR Interval:    QRS Duration: 118 QT Interval:  406 QTC Calculation: 493 R Axis:   100 Text Interpretation:  Atrial fibrillation Septal infarct , age undetermined Lateral infarct , age undetermined ST & T wave abnormality, consider inferior ischemia Abnormal ECG Confirmed by Hazle Coca [phone]) on 04/16/2016 5:34:18 PM       Radiology Dg Chest 2 View  Result Date: 04/16/2016 CLINICAL DATA:  Dyspnea EXAM: CHEST  2 VIEW COMPARISON:  04/05/2016 chest radiograph. FINDINGS: Stable configuration of median sternotomy wires and aortic valve prosthesis. Stable cardiomediastinal silhouette with mild cardiomegaly. No pneumothorax. Moderate left pleural effusion is increased. No right pleural effusion. No pulmonary edema. Increased left lung base opacity. IMPRESSION: 1. Moderate left pleural effusion, increased. 2. Increased left lung base opacity, favor compressive atelectasis. 3. Stable mild cardiomegaly without pulmonary edema. Electronically Signed   By: Ilona Sorrel M.D.   On: 04/16/2016  18:11    Procedures Procedures (including critical care time)  Medications Ordered in ED Medications  amiodarone (PACERONE) tablet 200 mg (not administered)  aspirin  EC tablet 325 mg (not administered)  carvedilol (COREG) tablet 3.125 mg (not administered)  glipiZIDE (GLUCOTROL) tablet 10 mg (not administered)  oxyCODONE (Oxy IR/ROXICODONE) immediate release tablet 5-10 mg (not administered)  tamsulosin (FLOMAX) capsule 0.4 mg (not administered)  atorvastatin (LIPITOR) tablet 80 mg (not administered)  sodium chloride flush (NS) 0.9 % injection 3 mL (3 mLs Intravenous Given 04/16/16 2221)  sodium chloride flush (NS) 0.9 % injection 3 mL (not administered)  0.9 %  sodium chloride infusion (not administered)  acetaminophen (TYLENOL) tablet 650 mg (not administered)  ondansetron (ZOFRAN) injection 4 mg (not administered)  furosemide (LASIX) injection 40 mg (not administered)  insulin aspart (novoLOG) injection 0-15 Units (not administered)  heparin ADULT infusion 100 units/mL (25000 units/262mL sodium chloride 0.45%) (1,250 Units/hr Intravenous New Bag/Given 04/16/16 2300)  Influenza vac split quadrivalent PF (FLUARIX) injection 0.5 mL (not administered)  pneumococcal 23 valent vaccine (PNU-IMMUNE) injection 0.5 mL (not administered)  furosemide (LASIX) injection 20 mg (20 mg Intravenous Given 04/16/16 2006)  heparin bolus via infusion 2,000 Units (2,000 Units Intravenous Bolus from Bag 04/16/16 2306)     Initial Impression / Assessment and Plan / ED Course  I have reviewed the triage vital signs and the nursing notes.  Pertinent labs & imaging results that were available during my care of the patient were reviewed by me and considered in my medical decision making (see chart for details).     Patient presents to the emergency department with shortness of breath and lower extremity edema following cardiac surgery. He is tachypneic on examination with evidence of volume overload. Presentation  is concerning for congestive heart failure with some volume overload.  Provided with Lasix for diuresis. Cardiology consulted for further management. Patient updated offending systoles and recommendation for admission for further treatment and he is in agreement with plan.  Final Clinical Impressions(s) / ED Diagnoses   Final diagnoses:  None    New Prescriptions Current Discharge Medication List       Quintella Reichert, MD 04/17/16 [phone]

## 2016-04-16 NOTE — ED Notes (Signed)
Patient transported to X-ray 

## 2016-04-16 NOTE — ED Triage Notes (Signed)
Pt. Stated, I had open heart January last in Thursday, I did good for about the 1st 3- days and then I started going down hill.  I've had a lot of SOB and My legs and feet have stared swelling and Ive ran out of Lasix. I Just can't hardly breath.

## 2016-04-16 NOTE — H&P (Signed)
History and Physical   Admit date: 04/16/2016 Name:  Mark Cabrera Medical record number: [id number] DOB/Age:  1950/09/11  66 y.o. male  Referring Physician:   [facility]  Primary Cardiologist:  [facility] heart care   Primary Physician:  [facility]   Chief complaint/reason for admission: Shortness of breath and leg swelling  HPI:  This 66 year old male had aortic valve replacement a 23 mm Edwards magna ease bovine pericardial valve, left atrial appendage clipping and vein grafts to the OM1 and posterior descending artery on January 19.  He was noted to have atrial fibrillation in the holding area and had some postoperative atrial fibrillation following surgery.  He was in sinus rhythm on discharge.  He has been on furosemide and had some mild edema.  He noted worsening swelling over the past several days and had increasing shortness of breath.  He went out to the movies with friends yesterday and had significant dyspnea last night and presented to the emergency room today with significant increase in pedal edema and worsening shortness of breath.  He was found to be in atrial fibrillation in the emergency room.  He did note that he had taken some extra metformin yesterday because he did not feel good.  He was just taking glipizide previously.  He has not been careful with fluid restriction and has been using some salt.  He has the usual amount of sternotomy pain but has no anginal type chest pain.  He may have used some ibuprofen.   Past Medical History:  Diagnosis Date  . Acute diastolic congestive heart failure ([place])   . Aortic valve stenosis   . Atypical nevi   . Coronary artery disease   . Depression    "years ago"  . Diabetes mellitus   . GERD (gastroesophageal reflux disease)   . History of kidney stones   . Hyperlipidemia    hx of transaminitis secondary to statin and he has decided not to use statins secondary to potential side effects.  .  Hypertension   . Nephrolithiasis   . Osteoarthritis, knee   . Sleep apnea     Central apnea. Not using cpap  . Stroke [facility]) 2007  . Transaminitis     Statin-induced  . 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 2-D echo was normal no embolic source found.     Past Surgical History:  Procedure Laterality Date  . AORTIC VALVE REPLACEMENT N/A 03/30/2016   Procedure: AORTIC VALVE REPLACEMENT (AVR);  Surgeon: [name], MD;  [facility];  Service: Open Heart Surgery;  Laterality: N/A;  . CARDIAC CATHETERIZATION N/A 03/15/2016   Procedure: Right/Left Heart Cath and Coronary Angiography;  Surgeon: Burnell Blanks, MD;  Location: [place] CV LAB;  Service: Cardiovascular;  Laterality: N/A;  . CLIPPING OF ATRIAL APPENDAGE N/A 03/30/2016   Procedure: CLIPPING OF LEFT ATRIAL APPENDAGE;  Surgeon: [name], MD;  Location: [place];  Service: Open Heart Surgery;  Laterality: N/A;  . CORONARY ARTERY BYPASS GRAFT N/A 03/30/2016   Procedure: CORONARY ARTERY BYPASS GRAFTING (CABG) Times Two;  Surgeon: [name], MD;  Location: [place];  Service: Open Heart Surgery;  Laterality: N/A;  . KNEE ARTHROSCOPY  W/ PARTIAL MEDIAL MENISCECTOMY  05/12/2005   right, performed by Dr. French Ana for torn medial meniscus.  Marland Kitchen ROTATOR CUFF REPAIR Right 2016  . TEE WITHOUT CARDIOVERSION N/A 03/30/2016   Procedure: TRANSESOPHAGEAL ECHOCARDIOGRAM (TEE);  Surgeon: [name], MD;  Location: [place];  Service: Open Heart Surgery;  Laterality: N/A;   Allergies: is allergic to crab (diagnostic) and penicillins.   Medications: Prior to Admission medications   Medication Sig Start Date End Date Taking?  Authorizing Provider  amiodarone (PACERONE) 200 MG tablet Take 1 tablet (200 mg total) by mouth 2 (two) times daily after a meal. For 7 Days, then decrease to 200 mg daily 04/10/16  Yes Erin R Barrett, PA-C  Ascorbic Acid (VITAMIN C PO) Take 1 tablet by mouth daily.   Yes Historical Provider, MD  aspirin EC 325 MG EC tablet Take 1 tablet (325 mg total) by mouth daily. 04/10/16  Yes Erin R Barrett, PA-C  carvedilol (COREG) 3.125 MG tablet Take 1 tablet (3.125 mg total) by mouth 2 (two) times daily with a meal. 04/10/16  Yes Erin R Barrett, PA-C  furosemide (LASIX) 40 MG tablet Take 1 tablet (40 mg total) by mouth daily. For 7 days 04/10/16  Yes Erin R Barrett, PA-C  gabapentin (NEURONTIN) 100 MG capsule Take 1 capsule (100 mg total) by mouth daily. 04/11/16 05/11/16 Yes [name], MD  glipiZIDE (GLUCOTROL) 10 MG tablet Take 1 tablet (10 mg total) by mouth daily before breakfast. 04/11/16  Yes Erin R Barrett, PA-C  ibuprofen (ADVIL,MOTRIN) 200 MG tablet Take 200 mg by mouth every 6 (six) hours as needed for headache or mild pain (or swelling).   Yes Historical Provider, MD  metFORMIN (GLUCOPHAGE-XR) 500 MG 24 hr tablet Take 1,000 mg by mouth 2 (two) times daily. 03/08/16  Yes Historical Provider, MD  oxyCODONE (OXY IR/ROXICODONE) 5 MG immediate release tablet Take 1-2 tablets (5-10 mg total) by mouth every 3 (three) hours as needed for severe pain. 04/10/16  Yes Erin R Barrett, PA-C  potassium chloride (K-DUR,KLOR-CON) 20 MEQ tablet Take 1 tablet (20 mEq total) by mouth daily. For 7 Days 04/10/16  Yes Erin R Barrett, PA-C  tamsulosin (FLOMAX) 0.4 MG CAPS capsule Take 1 capsule (0.4 mg total) by mouth daily after supper. 04/10/16  Yes Erin R Barrett, PA-C  atorvastatin (LIPITOR) 80 MG tablet Take 1 tablet (80 mg total) by mouth daily at 6 PM. Patient not taking: Reported on 04/16/2016 03/18/16   Erlene Quan, PA-C   Family History:  Family Status  Relation Status  . Mother Deceased  . Father Deceased    Social History:   reports that he has never smoked. He has never used smokeless tobacco. He reports that he drinks alcohol. He reports that he does not use drugs.   Social History   Social History Narrative   Single but has a girlfriend.    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       Patient has medications disability post stroke.           He is currently living with his brother   Review of Systems: He has not been weighing at home.  He has had reasonable appetite since discharge has had the initial medical sternotomy pain.  His urine output is dropped off in the past couple of days.  He has complained of significant constipation since he was home.   Other than as noted above, the  remainder of the review of systems is normal  Physical Exam: BP 102/88   Pulse 100   Temp 97.7 F (36.5 C)   Resp 23   Ht 5\' 8"  (1.727 m)   Wt 102.5 kg (226 lb)   SpO2 100%   BMI 34.36 kg/m  General appearance: He is a pleasant male currently in no acute distress mildly short of breath Head: Normocephalic, without obvious abnormality, atraumatic Eyes: conjunctivae/corneas clear. PERRL, EOM's intact. Fundi benign. Neck: no adenopathy, no carotid bruit, supple, symmetrical, trachea midline and Mildly elevated JVP Lungs: Reduced breath sounds at the left base, clear on the right Heart: Irregular rate and rhythm, normal S1 and S2, no S3, faint 1/6 systolic murmur, healed median sternotomy scar Abdomen: soft, non-tender; bowel sounds normal; no masses,  no organomegaly Rectal: deferred Extremities: 2+ edema bilaterally, healed saphenous vein harvesting scars noted, normal range of motion Pulses: 2+ and symmetric Skin: Skin color, texture, turgor normal. No rashes or lesions Neurologic: Grossly normal  Labs: CBC  Recent Labs  04/16/16 1710  WBC 8.6  RBC 3.22*  HGB 8.6*  HCT 26.6*  PLT 418*  MCV 82.6  MCH 26.7  MCHC 32.3  RDW 14.5   CMP   Recent  Labs  04/16/16 1710  NA 135  K 3.9  CL 99*  CO2 22  GLUCOSE 167*  BUN 22*  CREATININE 1.32*  CALCIUM 9.6  GFRNONAA 54*  GFRAA >60    BNP (last 3 results)  Recent Labs  04/16/16 1710  BNP 612.8*   EKG: atrial fibrillation new since 1/22/108, nonspecific ST and T-wave changes Independently reviewed by me  Radiology: Increased left effusion, cardiomegaly, relatively clear lung fields on the right   IMPRESSIONS: 1.  Acute on chronic diastolic heart failure  2.  Persistent atrial fibrillation currently 3.  Recent aortic valve replacement and coronary bypass grafting 4.  Diabetes mellitus with  peripheral neuropathy 5.  Hypertensive heart disease 6.  Acute renal failure therefore worsening from heart failure or diuresis   PLAN: 1.  Admission to telemetry 2.  Intravenous heparin because of atrial fibrillation-likely will require systemic anticoagulation-will defer choice of warfarin versus other agent to primary team since he has had recent aortic valve replacement 3.  Intravenous diltiazem to control rate and increase amiodarone 4.  May require TEE cardioversion 5.  Intravenous diuresis with furosemide 6.  Repeat echocardiogram to assess aortic valve to be sure no thrombus and also to evaluate LV function post-bypass.  Signed: Kerry Hough MD [facility]  04/16/2016, 9:06 PM

## 2016-04-17 ENCOUNTER — Encounter (HOSPITAL_COMMUNITY): Payer: Self-pay

## 2016-04-17 ENCOUNTER — Inpatient Hospital Stay (HOSPITAL_COMMUNITY): Payer: Medicare Other

## 2016-04-17 DIAGNOSIS — I509 Heart failure, unspecified: Secondary | ICD-10-CM

## 2016-04-17 DIAGNOSIS — I5033 Acute on chronic diastolic (congestive) heart failure: Secondary | ICD-10-CM

## 2016-04-17 DIAGNOSIS — I480 Paroxysmal atrial fibrillation: Secondary | ICD-10-CM

## 2016-04-17 LAB — CBC
HCT: 24.1 % — ABNORMAL LOW (ref 39.0–52.0)
Hemoglobin: 7.6 g/dL — ABNORMAL LOW (ref 13.0–17.0)
MCH: 25.7 pg — AB (ref 26.0–34.0)
MCHC: 31.5 g/dL (ref 30.0–36.0)
MCV: 81.4 fL (ref 78.0–100.0)
PLATELETS: 326 10*3/uL (ref 150–400)
RBC: 2.96 MIL/uL — ABNORMAL LOW (ref 4.22–5.81)
RDW: 14.5 % (ref 11.5–15.5)
WBC: 5.9 10*3/uL (ref 4.0–10.5)

## 2016-04-17 LAB — GLUCOSE, CAPILLARY
GLUCOSE-CAPILLARY: 163 mg/dL — AB (ref 65–99)
Glucose-Capillary: 152 mg/dL — ABNORMAL HIGH (ref 65–99)
Glucose-Capillary: 140 mg/dL — ABNORMAL HIGH (ref 65–99)
Glucose-Capillary: 83 mg/dL (ref 65–99)
Glucose-Capillary: 173 mg/dL — ABNORMAL HIGH (ref 65–99)

## 2016-04-17 LAB — BASIC METABOLIC PANEL
Anion gap: 9 (ref 5–15)
BUN: 24 mg/dL — AB (ref 6–20)
CO2: 26 mmol/L (ref 22–32)
CREATININE: 1.46 mg/dL — AB (ref 0.61–1.24)
Calcium: 9 mg/dL (ref 8.9–10.3)
Chloride: 100 mmol/L — ABNORMAL LOW (ref 101–111)
GFR calc Af Amer: 56 mL/min — ABNORMAL LOW (ref >60)
GFR calc non Af Amer: 48 mL/min — ABNORMAL LOW (ref >60)
GLUCOSE: 153 mg/dL — AB (ref 65–99)
Potassium: 3.5 mmol/L (ref 3.5–5.1)
Sodium: 135 mmol/L (ref 135–145)

## 2016-04-17 LAB — ECHOCARDIOGRAM LIMITED
AO mean calculated velocity dopler: 170 cm/s
AOVTI: 53 cm
AVCELMEANRAT: 0.42
AVG: 15 mmHg
AVPG: 32 mmHg
AVPKVEL: 284 cm/s
Ao pk vel: 0.46 m/s
CHL CUP DOP CALC LVOT VTI: 23 cm
CHL CUP MV DEC (S): 183
E decel time: 183 msec
FS: 20 % — AB (ref 28–44)
HEIGHTINCHES: 68 in
IVS/LV PW RATIO, ED: 1.34
LA ID, A-P, ES: 58 mm
LA diam index: 2.7 cm/m2
LA vol A4C: 99.8 ml
LA vol index: 43.4 mL/m2
LAVOL: 93.4 mL
LEFT ATRIUM END SYS DIAM: 58 mm
LVOT peak grad rest: 7 mmHg
LVOT peak vel: 132 cm/s
LVOTVTI: 0.43 cm
MV Peak grad: 9 mmHg
MV pk A vel: 77.1 m/s
MVPKEVEL: 146 m/s
PW: 10.7 mm — AB (ref 0.6–1.1)
WEIGHTICAEL: 3624 [oz_av]

## 2016-04-17 LAB — APTT: aPTT: 58 seconds — ABNORMAL HIGH (ref 24–36)

## 2016-04-17 LAB — TSH: TSH: 2.03 u[IU]/mL (ref 0.350–4.500)

## 2016-04-17 LAB — PROTIME-INR
INR: 1.32
Prothrombin Time: 16.4 seconds — ABNORMAL HIGH (ref 11.4–15.2)

## 2016-04-17 LAB — HEPARIN LEVEL (UNFRACTIONATED)
Heparin Unfractionated: 0.15 IU/mL — ABNORMAL LOW (ref 0.30–0.70)
Heparin Unfractionated: 0.3 IU/mL (ref 0.30–0.70)

## 2016-04-17 MED ORDER — CARVEDILOL 3.125 MG PO TABS
3.1250 mg | ORAL_TABLET | Freq: Two times a day (BID) | ORAL | Status: DC
  Administered 2016-04-17 – 2016-04-28 (×23): 3.125 mg via ORAL
  Filled 2016-04-17 (×23): qty 1

## 2016-04-17 MED ORDER — INSULIN ASPART 100 UNIT/ML ~~LOC~~ SOLN
0.0000 [IU] | Freq: Three times a day (TID) | SUBCUTANEOUS | Status: DC
  Administered 2016-04-17: 2 [IU] via SUBCUTANEOUS
  Administered 2016-04-17 – 2016-04-18 (×2): 3 [IU] via SUBCUTANEOUS
  Administered 2016-04-18: 2 [IU] via SUBCUTANEOUS
  Administered 2016-04-18: 5 [IU] via SUBCUTANEOUS
  Administered 2016-04-19: 15 [IU] via SUBCUTANEOUS
  Administered 2016-04-19 – 2016-04-20 (×3): 8 [IU] via SUBCUTANEOUS
  Administered 2016-04-20: 5 [IU] via SUBCUTANEOUS
  Administered 2016-04-20: 11 [IU] via SUBCUTANEOUS
  Administered 2016-04-21: 3 [IU] via SUBCUTANEOUS
  Administered 2016-04-21: 8 [IU] via SUBCUTANEOUS
  Administered 2016-04-21 – 2016-04-22 (×2): 5 [IU] via SUBCUTANEOUS
  Administered 2016-04-22: 8 [IU] via SUBCUTANEOUS
  Administered 2016-04-22: 5 [IU] via SUBCUTANEOUS
  Administered 2016-04-23 (×2): 3 [IU] via SUBCUTANEOUS
  Administered 2016-04-23: 8 [IU] via SUBCUTANEOUS
  Administered 2016-04-24: 3 [IU] via SUBCUTANEOUS
  Administered 2016-04-25 – 2016-04-27 (×2): 5 [IU] via SUBCUTANEOUS
  Administered 2016-04-27: 3 [IU] via SUBCUTANEOUS
  Administered 2016-04-28: 5 [IU] via SUBCUTANEOUS
  Administered 2016-04-28: 3 [IU] via SUBCUTANEOUS
  Administered 2016-04-28: 2 [IU] via SUBCUTANEOUS

## 2016-04-17 MED ORDER — INFLUENZA VAC SPLIT QUAD 0.5 ML IM SUSY
0.5000 mL | PREFILLED_SYRINGE | INTRAMUSCULAR | Status: DC
  Filled 2016-04-17 (×2): qty 0.5

## 2016-04-17 MED ORDER — MAGNESIUM HYDROXIDE 400 MG/5ML PO SUSP
30.0000 mL | Freq: Every day | ORAL | Status: DC | PRN
  Administered 2016-04-17 – 2016-04-23 (×2): 30 mL via ORAL
  Filled 2016-04-17 (×3): qty 30

## 2016-04-17 MED ORDER — PNEUMOCOCCAL VAC POLYVALENT 25 MCG/0.5ML IJ INJ
0.5000 mL | INJECTION | INTRAMUSCULAR | Status: DC
  Filled 2016-04-17 (×2): qty 0.5

## 2016-04-17 NOTE — Progress Notes (Signed)
[place] for heparin  Indication: atrial fibrillation  Allergies  Allergen Reactions  . Crab (Diagnostic) Anaphylaxis  . Penicillins Other (See Comments)    Has patient had a PCN reaction causing immediate rash, facial/tongue/throat swelling, SOB or lightheadedness with hypotension: Unk Has patient had a PCN reaction causing severe rash involving mucus membranes or skin necrosis: Unk Has patient had a PCN reaction that required hospitalization: Unk Has patient had a PCN reaction occurring within the last 10 years: No If all of the above answers are "NO", then may proceed with Cephalosporin use.     Patient Measurements: Height: 5\' 8"  (172.7 cm) Weight: 226 lb 8 oz (102.7 kg) (Scale A) IBW/kg (Calculated) : 68.4 Heparin Dosing Weight: 90kg  Vital Signs: Temp: 98.7 F (37.1 C) (02/05 0416) Temp Source: Oral (02/05 0416) BP: 133/66 (02/05 0416) Pulse Rate: 71 (02/05 0416)  Labs:  Recent Labs  04/16/16 1710 04/16/16 2319 04/17/16 0634  HGB 8.6*  --  7.6*  HCT 26.6*  --  24.1*  PLT 418*  --  326  APTT  --  58*  --   LABPROT  --  16.4*  --   INR  --  1.32  --   HEPARINUNFRC  --   --  0.15*  CREATININE 1.32*  --   --     Estimated Creatinine Clearance: 63.1 mL/min (by C-G formula based on SCr of 1.32 mg/dL (H)).  Assessment: 66 yo male with Afib for heparin Goal of Therapy:  Heparin level 0.3-0.7 units/ml Monitor platelets by anticoagulation protocol: Yes   Plan:  Increase Heparin 1450 units/hr Check heparin level in 8 hours.  Phillis Knack, PharmD, BCPS  04/17/2016 7:36 AM

## 2016-04-17 NOTE — Progress Notes (Addendum)
Progress Note  Patient Name: Mark Cabrera Date of Encounter: 04/17/2016  Primary Cardiologist: [name]  Subjective   No chest pain or dyspnea this am.   Inpatient Medications    Scheduled Meds: . amiodarone  200 mg Oral BID PC  . aspirin  325 mg Oral Daily  . atorvastatin  80 mg Oral q1800  . carvedilol  3.125 mg Oral BID WC  . furosemide  40 mg Intravenous Q8H  . glipiZIDE  10 mg Oral QAC breakfast  . [START ON 04/18/2016] Influenza vac split quadrivalent PF  0.5 mL Intramuscular Tomorrow-1000  . insulin aspart  0-15 Units Subcutaneous TID WC  . [START ON 04/18/2016] pneumococcal 23 valent vaccine  0.5 mL Intramuscular Tomorrow-1000  . sodium chloride flush  3 mL Intravenous Q12H  . tamsulosin  0.4 mg Oral QPC supper   Continuous Infusions: . heparin 1,450 Units/hr (04/17/16 0754)   PRN Meds: sodium chloride, acetaminophen, magnesium hydroxide, ondansetron (ZOFRAN) IV, oxyCODONE, sodium chloride flush   Vital Signs    Vitals:   04/16/16 2130 04/16/16 2334 04/17/16 0416 04/17/16 0942  BP: 110/55 120/72 133/66 112/65  Pulse: 70 72 71 69  Resp: 13 15 16    Temp:  98.3 F (36.8 C) 98.7 F (37.1 C)   TempSrc:  Oral Oral   SpO2: 100% 94% 96% 100%  Weight:  227 lb 14.4 oz (103.4 kg) 226 lb 8 oz (102.7 kg)   Height:  5\' 8"  (1.727 m)      Intake/Output Summary (Last 24 hours) at 04/17/16 1106 Last data filed at 04/17/16 1023  Gross per 24 hour  Intake            687.5 ml  Output              665 ml  Net             22.5 ml   Filed Weights   04/16/16 1658 04/16/16 2334 04/17/16 0416  Weight: 226 lb (102.5 kg) 227 lb 14.4 oz (103.4 kg) 226 lb 8 oz (102.7 kg)    Telemetry    Sinus  - Personally Reviewed  ECG     Physical Exam   GEN: No acute distress.   Neck: No JVD Cardiac: RRR, soft systolic murmur. No rubs, or gallops.  Respiratory: Clear to auscultation bilaterally. GI: Soft, nontender, non-distended  MS: 2+ bilateral LE edema.  Neuro:  Nonfocal    Psych: Normal affect   Labs    Chemistry Recent Labs Lab 04/16/16 1710 04/17/16 0634  NA 135 135  K 3.9 3.5  CL 99* 100*  CO2 22 26  GLUCOSE 167* 153*  BUN 22* 24*  CREATININE 1.32* 1.46*  CALCIUM 9.6 9.0  GFRNONAA 54* 48*  GFRAA >60 56*  ANIONGAP 14 9     Hematology Recent Labs Lab 04/16/16 1710 04/17/16 0634  WBC 8.6 5.9  RBC 3.22* 2.96*  HGB 8.6* 7.6*  HCT 26.6* 24.1*  MCV 82.6 81.4  MCH 26.7 25.7*  MCHC 32.3 31.5  RDW 14.5 14.5  PLT 418* 326    Cardiac EnzymesNo results for input(s): TROPONINI in the last 168 hours.  Recent Labs Lab 04/16/16 1715  TROPIPOC 0.09*     BNP Recent Labs Lab 04/16/16 1710  BNP 612.8*     DDimer No results for input(s): DDIMER in the last 168 hours.   Radiology    Dg Chest 2 View  Result Date: 04/16/2016 CLINICAL DATA:  Dyspnea EXAM: CHEST  2 VIEW COMPARISON:  04/05/2016 chest radiograph. FINDINGS: Stable configuration of median sternotomy wires and aortic valve prosthesis. Stable cardiomediastinal silhouette with mild cardiomegaly. No pneumothorax. Moderate left pleural effusion is increased. No right pleural effusion. No pulmonary edema. Increased left lung base opacity. IMPRESSION: 1. Moderate left pleural effusion, increased. 2. Increased left lung base opacity, favor compressive atelectasis. 3. Stable mild cardiomegaly without pulmonary edema. Electronically Signed   By: Ilona Sorrel M.D.   On: 04/16/2016 18:11    Cardiac Studies    Patient Profile     66 y.o. 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 Roxan Hockey. He did have atrial fib post-op but was in sinus at time of discharge. He is now admitted with volume overload and atrial fibrillation.   Assessment & Plan    1. Acute on chronic diastolic CHF: He is volume overloaded. Will continue IV Lasix today and follow renal function closely. Echo pending today.    2. Atrial fibrillation,paroxysmal: He is now in sinus. Will continue po amiodarone. Will continue IV heparin today. Will plan long term anti-coagulation. This will need to be coumadin for now. May start tomorrow. Following H/H which is low post-op.   3. Anemia: Post-op. Follow H/H  4. Left pleural effusion: Will follow for now with diuresis. If it does not respond to diuresis, may need to involve CT surgery since he has recently undergone bypass and AVR.   Signed, Lauree Chandler, MD  04/17/2016, 11:06 AM

## 2016-04-17 NOTE — Progress Notes (Signed)
  Echocardiogram 2D Echocardiogram has been performed.  Jennette Dubin 04/17/2016, 2:02 PM

## 2016-04-17 NOTE — Progress Notes (Signed)
[place] for heparin  Indication: atrial fibrillation  Allergies  Allergen Reactions  . Crab (Diagnostic) Anaphylaxis  . Penicillins Other (See Comments)    Has patient had a PCN reaction causing immediate rash, facial/tongue/throat swelling, SOB or lightheadedness with hypotension: Unk Has patient had a PCN reaction causing severe rash involving mucus membranes or skin necrosis: Unk Has patient had a PCN reaction that required hospitalization: Unk Has patient had a PCN reaction occurring within the last 10 years: No If all of the above answers are "NO", then may proceed with Cephalosporin use.     Patient Measurements: Height: 5\' 8"  (172.7 cm) Weight: 226 lb 8 oz (102.7 kg) (Scale A) IBW/kg (Calculated) : 68.4 Heparin Dosing Weight: 90kg  Vital Signs: Temp: 98.1 F (36.7 C) (02/05 1128) Temp Source: Oral (02/05 1128) BP: 128/71 (02/05 1128) Pulse Rate: 69 (02/05 1128)  Labs:  Recent Labs  04/16/16 1710 04/16/16 2319 04/17/16 0634 04/17/16 1546  HGB 8.6*  --  7.6*  --   HCT 26.6*  --  24.1*  --   PLT 418*  --  326  --   APTT  --  58*  --   --   LABPROT  --  16.4*  --   --   INR  --  1.32  --   --   HEPARINUNFRC  --   --  0.15* 0.30  CREATININE 1.32*  --  1.46*  --     Estimated Creatinine Clearance: 57 mL/min (by C-G formula based on SCr of 1.46 mg/dL (H)).  Assessment: 66 yo male with Afib for heparin. Repeat HL is now therapeutic at 0.3 on heparin 1450 units/hr. No issues with infusion or bleeding noted.  Goal of Therapy:  Heparin level 0.3-0.7 units/ml Monitor platelets by anticoagulation protocol: Yes   Plan:  Continue heparin 1450 units/hr 8h HL Daily HL Monitor s/sx of bleeding  Andrey Cota. Diona Foley, PharmD, [place] Clinical Pharmacist [phone] 04/17/2016 4:38 PM

## 2016-04-17 NOTE — Progress Notes (Signed)
Patient c/o feeling weak and short of breath at times during 7 a to 7 p shift.  Patient did get up and walk to the bathroom with standby assist from RN and tech.  Patient maintains sats in the high 90's on room air however feels less short of breath when he wears his oxygen so wearing for comfort.  Receiving IV lasix and diuresing at this time.  Does relate some back and chest pain since having surgery back in January but defers any pain medication during this shift.

## 2016-04-17 NOTE — Progress Notes (Signed)
      [address]       [place] [address]             [phone]      Readmission noted  Mr. Griepentrog feels better this afternoon. Converted back into SR  He still has 2+ edema and a moderately large left pleural effusion  Given that he is going to be anticoagulated, I think it would be a good idea to go ahead and have an US guided thoracentesis done to remove as much fluid as possible. While it might resolve completely with diuresis, it will probably take a long time and doing a thoracentesis will only be more complicated once he is therapeutic on oral anticoagulation.  Revonda Standard Roxan Hockey, MD [facility] [phone]

## 2016-04-18 ENCOUNTER — Inpatient Hospital Stay (HOSPITAL_COMMUNITY): Payer: Medicare Other

## 2016-04-18 DIAGNOSIS — R0789 Other chest pain: Secondary | ICD-10-CM

## 2016-04-18 DIAGNOSIS — R079 Chest pain, unspecified: Secondary | ICD-10-CM | POA: Diagnosis present

## 2016-04-18 DIAGNOSIS — I5031 Acute diastolic (congestive) heart failure: Secondary | ICD-10-CM

## 2016-04-18 DIAGNOSIS — J90 Pleural effusion, not elsewhere classified: Secondary | ICD-10-CM

## 2016-04-18 DIAGNOSIS — D500 Iron deficiency anemia secondary to blood loss (chronic): Secondary | ICD-10-CM

## 2016-04-18 DIAGNOSIS — I2510 Atherosclerotic heart disease of native coronary artery without angina pectoris: Secondary | ICD-10-CM

## 2016-04-18 DIAGNOSIS — I313 Pericardial effusion (noninflammatory): Secondary | ICD-10-CM

## 2016-04-18 LAB — HEMOGLOBIN A1C
Hgb A1c MFr Bld: 7.1 % — ABNORMAL HIGH (ref 4.8–5.6)
MEAN PLASMA GLUCOSE: 157 mg/dL

## 2016-04-18 LAB — CBC
HCT: 26.7 % — ABNORMAL LOW (ref 39.0–52.0)
HEMATOCRIT: 22.9 % — AB (ref 39.0–52.0)
Hemoglobin: 7.5 g/dL — ABNORMAL LOW (ref 13.0–17.0)
Hemoglobin: 8.4 g/dL — ABNORMAL LOW (ref 13.0–17.0)
MCH: 25.8 pg — AB (ref 26.0–34.0)
MCH: 26.5 pg (ref 26.0–34.0)
MCHC: 32.8 g/dL (ref 30.0–36.0)
MCHC: 31.5 g/dL (ref 30.0–36.0)
MCV: 80.9 fL (ref 78.0–100.0)
MCV: 82.2 fL (ref 78.0–100.0)
Platelets: 261 10*3/uL (ref 150–400)
Platelets: 293 10*3/uL (ref 150–400)
RBC: 2.83 MIL/uL — ABNORMAL LOW (ref 4.22–5.81)
RBC: 3.25 MIL/uL — ABNORMAL LOW (ref 4.22–5.81)
RDW: 14.8 % (ref 11.5–15.5)
RDW: 14.7 % (ref 11.5–15.5)
WBC: 5.3 10*3/uL (ref 4.0–10.5)
WBC: 7.1 10*3/uL (ref 4.0–10.5)

## 2016-04-18 LAB — BASIC METABOLIC PANEL WITH GFR
Anion gap: 10 (ref 5–15)
BUN: 25 mg/dL — ABNORMAL HIGH (ref 6–20)
CO2: 26 mmol/L (ref 22–32)
Calcium: 9.1 mg/dL (ref 8.9–10.3)
Chloride: 99 mmol/L — ABNORMAL LOW (ref 101–111)
Creatinine, Ser: 1.37 mg/dL — ABNORMAL HIGH (ref 0.61–1.24)
GFR calc Af Amer: >60 mL/min
GFR calc non Af Amer: 52 mL/min — ABNORMAL LOW
Glucose, Bld: 113 mg/dL — ABNORMAL HIGH (ref 65–99)
Potassium: 3.1 mmol/L — ABNORMAL LOW (ref 3.5–5.1)
Sodium: 135 mmol/L (ref 135–145)

## 2016-04-18 LAB — GLUCOSE, CAPILLARY
GLUCOSE-CAPILLARY: 225 mg/dL — AB (ref 65–99)
Glucose-Capillary: 166 mg/dL — ABNORMAL HIGH (ref 65–99)
Glucose-Capillary: 131 mg/dL — ABNORMAL HIGH (ref 65–99)
Glucose-Capillary: 152 mg/dL — ABNORMAL HIGH (ref 65–99)

## 2016-04-18 LAB — PREPARE RBC (CROSSMATCH)

## 2016-04-18 LAB — BODY FLUID CELL COUNT WITH DIFFERENTIAL
LYMPHS FL: 78 %
MONOCYTE-MACROPHAGE-SEROUS FLUID: 5 % — AB (ref 50–90)
Neutrophil Count, Fluid: 17 % (ref 0–25)
Total Nucleated Cell Count, Fluid: 595 cu mm (ref 0–1000)

## 2016-04-18 LAB — BASIC METABOLIC PANEL
Anion gap: 11 (ref 5–15)
BUN: 22 mg/dL — AB (ref 6–20)
CO2: 26 mmol/L (ref 22–32)
CREATININE: 1.43 mg/dL — AB (ref 0.61–1.24)
Calcium: 8.7 mg/dL — ABNORMAL LOW (ref 8.9–10.3)
Chloride: 98 mmol/L — ABNORMAL LOW (ref 101–111)
GFR calc Af Amer: 57 mL/min — ABNORMAL LOW (ref >60)
GFR calc non Af Amer: 49 mL/min — ABNORMAL LOW (ref >60)
Glucose, Bld: 178 mg/dL — ABNORMAL HIGH (ref 65–99)
Potassium: 3.7 mmol/L (ref 3.5–5.1)
Sodium: 135 mmol/L (ref 135–145)

## 2016-04-18 LAB — LACTATE DEHYDROGENASE, PLEURAL OR PERITONEAL FLUID: LD FL: 143 U/L — AB (ref 3–23)

## 2016-04-18 LAB — GLUCOSE, SEROUS FLUID: Glucose, Fluid: 168 mg/dL

## 2016-04-18 LAB — PROTEIN, BODY FLUID

## 2016-04-18 LAB — HEPARIN LEVEL (UNFRACTIONATED): Heparin Unfractionated: 0.39 [IU]/mL (ref 0.30–0.70)

## 2016-04-18 LAB — TROPONIN I: Troponin I: 0.15 ng/mL (ref <0.03)

## 2016-04-18 MED ORDER — MORPHINE SULFATE (PF) 2 MG/ML IV SOLN
INTRAVENOUS | Status: AC
  Filled 2016-04-18: qty 1

## 2016-04-18 MED ORDER — PREDNISONE 10 MG (21) PO TBPK
10.0000 mg | ORAL_TABLET | Freq: Three times a day (TID) | ORAL | Status: AC
  Administered 2016-04-19 (×3): 10 mg via ORAL

## 2016-04-18 MED ORDER — FENTANYL CITRATE (PF) 100 MCG/2ML IJ SOLN
25.0000 ug | INTRAMUSCULAR | Status: DC | PRN
  Administered 2016-04-18 – 2016-04-19 (×3): 25 ug via INTRAVENOUS
  Filled 2016-04-18 (×3): qty 2

## 2016-04-18 MED ORDER — PREDNISONE 10 MG (21) PO TBPK: 20.0000 mg | ORAL_TABLET | Freq: Every evening | ORAL | Status: DC

## 2016-04-18 MED ORDER — MORPHINE SULFATE (PF) 2 MG/ML IV SOLN: 2.0000 mg | INTRAVENOUS | Status: DC | PRN

## 2016-04-18 MED ORDER — PREDNISONE 10 MG (21) PO TBPK
20.0000 mg | ORAL_TABLET | Freq: Every evening | ORAL | Status: AC
  Administered 2016-04-18: 20 mg via ORAL

## 2016-04-18 MED ORDER — NITROGLYCERIN 0.4 MG SL SUBL
0.4000 mg | SUBLINGUAL_TABLET | SUBLINGUAL | Status: DC | PRN
  Administered 2016-04-18 (×2): 0.4 mg via SUBLINGUAL
  Filled 2016-04-18: qty 1

## 2016-04-18 MED ORDER — LIDOCAINE HCL 1 % IJ SOLN
INTRAMUSCULAR | Status: AC
  Filled 2016-04-18: qty 20

## 2016-04-18 MED ORDER — PREDNISONE 10 MG (21) PO TBPK: 10.0000 mg | ORAL_TABLET | Freq: Four times a day (QID) | ORAL | Status: DC

## 2016-04-18 MED ORDER — PREDNISONE 10 MG (21) PO TBPK
10.0000 mg | ORAL_TABLET | ORAL | Status: AC
  Administered 2016-04-18: 10 mg via ORAL

## 2016-04-18 MED ORDER — PREDNISONE 10 MG (21) PO TBPK: 20.0000 mg | ORAL_TABLET | Freq: Every morning | ORAL | Status: DC

## 2016-04-18 MED ORDER — PREDNISONE 10 MG (21) PO TBPK: 40.0000 mg | ORAL_TABLET | ORAL | Status: DC

## 2016-04-18 MED ORDER — PREDNISONE 10 MG (21) PO TBPK
20.0000 mg | ORAL_TABLET | Freq: Every evening | ORAL | Status: AC
  Administered 2016-04-19: 20 mg via ORAL

## 2016-04-18 MED ORDER — POTASSIUM CHLORIDE CRYS ER 20 MEQ PO TBCR
60.0000 meq | EXTENDED_RELEASE_TABLET | Freq: Once | ORAL | Status: AC
  Administered 2016-04-18: 60 meq via ORAL
  Filled 2016-04-18: qty 3

## 2016-04-18 MED ORDER — PREDNISONE 10 MG (21) PO TBPK: 10.0000 mg | ORAL_TABLET | Freq: Three times a day (TID) | ORAL | Status: DC

## 2016-04-18 MED ORDER — PREDNISONE 10 MG (21) PO TBPK
20.0000 mg | ORAL_TABLET | Freq: Every morning | ORAL | Status: AC
  Administered 2016-04-18: 20 mg via ORAL
  Filled 2016-04-18: qty 21

## 2016-04-18 MED ORDER — SODIUM CHLORIDE 0.9 % IV SOLN: Freq: Once | INTRAVENOUS | Status: DC

## 2016-04-18 MED ORDER — POTASSIUM CHLORIDE CRYS ER 20 MEQ PO TBCR
40.0000 meq | EXTENDED_RELEASE_TABLET | Freq: Two times a day (BID) | ORAL | Status: DC
  Administered 2016-04-18 – 2016-04-20 (×5): 40 meq via ORAL
  Filled 2016-04-18 (×5): qty 2

## 2016-04-18 MED ORDER — PREDNISONE 10 MG (21) PO TBPK
20.0000 mg | ORAL_TABLET | Freq: Every evening | ORAL | Status: DC
  Filled 2016-04-18: qty 21

## 2016-04-18 MED ORDER — PREDNISONE 10 MG (21) PO TBPK
10.0000 mg | ORAL_TABLET | Freq: Four times a day (QID) | ORAL | Status: AC
  Administered 2016-04-20 – 2016-04-22 (×8): 10 mg via ORAL
  Filled 2016-04-18: qty 21

## 2016-04-18 MED ORDER — NITROGLYCERIN 0.4 MG SL SUBL
SUBLINGUAL_TABLET | SUBLINGUAL | Status: AC
  Administered 2016-04-18: 0.4 mg via SUBLINGUAL
  Filled 2016-04-18: qty 1

## 2016-04-18 MED ORDER — PREDNISONE 10 MG (21) PO TBPK: 10.0000 mg | ORAL_TABLET | ORAL | Status: DC

## 2016-04-18 NOTE — Progress Notes (Signed)
[place] for heparin  Indication: atrial fibrillation  Allergies  Allergen Reactions  . Crab (Diagnostic) Anaphylaxis  . Penicillins Other (See Comments)    Has patient had a PCN reaction causing immediate rash, facial/tongue/throat swelling, SOB or lightheadedness with hypotension: Unk Has patient had a PCN reaction causing severe rash involving mucus membranes or skin necrosis: Unk Has patient had a PCN reaction that required hospitalization: Unk Has patient had a PCN reaction occurring within the last 10 years: No If all of the above answers are "NO", then may proceed with Cephalosporin use.     Patient Measurements: Height: 5\' 8"  (172.7 cm) Weight: 226 lb 8 oz (102.7 kg) (Scale A) IBW/kg (Calculated) : 68.4 Heparin Dosing Weight: 90kg  Vital Signs: Temp: 98.2 F (36.8 C) (02/06 0008) Temp Source: Oral (02/06 0008) BP: 105/58 (02/06 0008) Pulse Rate: 62 (02/06 0008)  Labs:  Recent Labs  04/16/16 1710 04/16/16 2319 04/17/16 0634 04/17/16 1546 04/18/16 0013 04/18/16 0016  HGB 8.6*  --  7.6*  --   --  7.5*  HCT 26.6*  --  24.1*  --   --  22.9*  PLT 418*  --  326  --   --  261  APTT  --  58*  --   --   --   --   LABPROT  --  16.4*  --   --   --   --   INR  --  1.32  --   --   --   --   HEPARINUNFRC  --   --  0.15* 0.30 0.39  --   CREATININE 1.32*  --  1.46*  --   --  1.37*    Estimated Creatinine Clearance: 60.8 mL/min (by C-G formula based on SCr of 1.37 mg/dL (H)).  Assessment: 66 yo male with Afib for heparin.   Goal of Therapy:  Heparin level 0.3-0.7 units/ml Monitor platelets by anticoagulation protocol: Yes   Plan:  Continue Heparin at current rate   Phillis Knack, PharmD, BCPS  04/18/2016 1:25 AM

## 2016-04-18 NOTE — Progress Notes (Signed)
      [address]       [place] [address]             [phone]      Feels well this evening. 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   Discussed with Dr. [name]- will try a taper of steroids to se if that helps at all.  Revonda Standard Roxan Hockey, MD [facility] [phone]

## 2016-04-18 NOTE — Progress Notes (Addendum)
Progress Note  Patient Name: Mark Cabrera Date of Encounter: 04/18/2016  Primary Cardiologist: [name]  Subjective   Mild dyspnea when walking. Mild chest pain, positional.   Inpatient Medications    Scheduled Meds: . amiodarone  200 mg Oral BID PC  . aspirin  325 mg Oral Daily  . atorvastatin  80 mg Oral q1800  . carvedilol  3.125 mg Oral BID WC  . furosemide  40 mg Intravenous Q8H  . glipiZIDE  10 mg Oral QAC breakfast  . Influenza vac split quadrivalent PF  0.5 mL Intramuscular Tomorrow-1000  . insulin aspart  0-15 Units Subcutaneous TID WC  . pneumococcal 23 valent vaccine  0.5 mL Intramuscular Tomorrow-1000  . sodium chloride flush  3 mL Intravenous Q12H  . tamsulosin  0.4 mg Oral QPC supper   Continuous Infusions: . heparin 1,450 Units/hr (04/17/16 1532)   PRN Meds: sodium chloride, acetaminophen, magnesium hydroxide, ondansetron (ZOFRAN) IV, oxyCODONE, sodium chloride flush   Vital Signs    Vitals:   04/17/16 1936 04/18/16 0008 04/18/16 0430 04/18/16 0800  BP: 106/61 (!) 105/58 113/63 114/62  Pulse: 60 62 61 62  Resp: 18 18 18 20   Temp: 98.1 F (36.7 C) 98.2 F (36.8 C) 97.8 F (36.6 C) 98.4 F (36.9 C)  TempSrc: Oral Oral Oral Oral  SpO2: 99% 97% 100% 98%  Weight:   222 lb 4.8 oz (100.8 kg)   Height:        Intake/Output Summary (Last 24 hours) at 04/18/16 0858 Last data filed at 04/18/16 0800  Gross per 24 hour  Intake              716 ml  Output             3515 ml  Net            -2799 ml   Filed Weights   04/16/16 2334 04/17/16 0416 04/18/16 0430  Weight: 227 lb 14.4 oz (103.4 kg) 226 lb 8 oz (102.7 kg) 222 lb 4.8 oz (100.8 kg)    Telemetry    Sinus  - Personally Reviewed  ECG     Physical Exam   GEN: No acute distress.   Neck: No JVD Cardiac: RRR, soft systolic murmur. No rubs, or gallops.  Respiratory: Clear to auscultation bilaterally. GI: Soft, nontender, non-distended  Ext: 2+ bilateral LE edema.  Neuro:  Nonfocal    Psych: Normal affect   Labs    Chemistry  Recent Labs Lab 04/16/16 1710 04/17/16 0634 04/18/16 0016  NA 135 135 135  K 3.9 3.5 3.1*  CL 99* 100* 99*  CO2 22 26 26   GLUCOSE 167* 153* 113*  BUN 22* 24* 25*  CREATININE 1.32* 1.46* 1.37*  CALCIUM 9.6 9.0 9.1  GFRNONAA 54* 48* 52*  GFRAA >60 56* >60  ANIONGAP 14 9 10      Hematology  Recent Labs Lab 04/16/16 1710 04/17/16 0634 04/18/16 0016  WBC 8.6 5.9 5.3  RBC 3.22* 2.96* 2.83*  HGB 8.6* 7.6* 7.5*  HCT 26.6* 24.1* 22.9*  MCV 82.6 81.4 80.9  MCH 26.7 25.7* 26.5  MCHC 32.3 31.5 32.8  RDW 14.5 14.5 14.8  PLT 418* 326 261    Cardiac EnzymesNo results for input(s): TROPONINI in the last 168 hours.   Recent Labs Lab 04/16/16 1715  TROPIPOC 0.09*     BNP  Recent Labs Lab 04/16/16 1710  BNP 612.8*     DDimer No results for input(s): DDIMER in the  last 168 hours.   Radiology    Dg Chest 2 View  Result Date: 04/16/2016 CLINICAL DATA:  Dyspnea EXAM: CHEST  2 VIEW COMPARISON:  04/05/2016 chest radiograph. FINDINGS: Stable configuration of median sternotomy wires and aortic valve prosthesis. Stable cardiomediastinal silhouette with mild cardiomegaly. No pneumothorax. Moderate left pleural effusion is increased. No right pleural effusion. No pulmonary edema. Increased left lung base opacity. IMPRESSION: 1. Moderate left pleural effusion, increased. 2. Increased left lung base opacity, favor compressive atelectasis. 3. Stable mild cardiomegaly without pulmonary edema. Electronically Signed   By: Ilona Sorrel M.D.   On: 04/16/2016 18:11    Cardiac Studies   Echo 04/17/16: Left ventricle: The cavity size was normal. There was mild focal   basal and mild concentric hypertrophy of the septum. Systolic   function was normal. The estimated ejection fraction was in the   range of 55% to 60%. Wall motion was normal; there were no   regional wall motion abnormalities. - Aortic valve: Trileaflet; normal thickness  leaflets. Mean   gradient (S): 15 mm Hg. Peak gradient (S): 32 mm Hg. - Mitral valve: There was mild regurgitation. - Left atrium: The atrium was moderately dilated. - Right ventricle: Systolic function was normal. - Right atrium: The atrium was mildly dilated. - Tricuspid valve: There was mild regurgitation. - Pulmonary arteries: Systolic pressure was within the normal   range. - Pericardium, extracardiac: A pericardial effusion was identified.   There was a left pleural effusion.  Impressions:  - S/P AVR with a bioprosthetic valve with normal transaortic   gradients. No AI or paravalvular leak.   There is a large circumferential pericardial effusion with   largest diameter around the posterior wall measuring 3 cm.   There are no significant respiratory variations in the mitral   inflow, IVC is borderline dilated. There is RV invagination but   normal diastolic filling.   There is also a large pleural effusion.     Overall, there are no signs of hemodynamic compromise, but close   follow up with a limited study for evaluation of the pericardial   effusion in 2-3 days is recommended.  Patient Profile     66 y.o. 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 Roxan Hockey. He did have atrial fib post-op but was in sinus at time of discharge. He is now admitted with volume overload and atrial fibrillation.   Assessment & Plan    1. Acute on chronic diastolic CHF: He is still volume overloaded. Good diuresis overnight. Negative 2.8 liters last 24 hours. Will continue IV Lasix today and follow renal function closely. Echo 04/17/16 with normal LV systolic function, large pericardial effusion, normally functioning AVR.  2. Atrial fibrillation,paroxysmal: He is now in sinus. Will continue po amiodarone. Will continue IV heparin today. Will plan long term anti-coagulation. This will need to be  coumadin for now. Will start after all invasive procedures performed. Following H/H which is low post-op.   3. Anemia: Post-op. Stable this am at 7.5. Will give one unit of pRBCs this am as he will need thoracentesis and possibly pericardiocentesis.   4. Left pleural effusion: Discussed with Dr. Roxan Hockey. He is post-op within last 3 weeks from CABG and AVR. Will ask IR to see to discuss thoracentesis.   5. Hypokalemia: replace K this am  6. Pericardial effusion: post-op. No signs of tamponade. Will repeat echo  later this week. Will discuss with CT surgery  7. CAD s/p CABG: stable. No chest pain. Continue current meds.   8. Aortic valve disease: s/p AVR. Functioning well by echo 04/17/16.   Signed, Lauree Chandler, MD  04/18/2016, 8:58 AM

## 2016-04-18 NOTE — Progress Notes (Signed)
Patient complaining of sharp, 7/10 chest pain.  1 nitro given with no relief, however SBP 88.  EKG obtained.  Melina Copa, PA paged and rapid. Will continue to monitor.

## 2016-04-18 NOTE — Procedures (Signed)
PROCEDURE SUMMARY:  Successful US guided diagnostic and therapeutic left thoracentesis. Yielded 1.4L of amber fluid. Pt tolerated procedure well. No immediate complications.  Specimen was sent for labs. CXR ordered.  Docia Barrier PA-C 04/18/2016 4:09 PM

## 2016-04-18 NOTE — Progress Notes (Signed)
Patient returned from thoracentesis. Bandaid clean, dry, and intact.  Patient has no complaints of pain and says breathing better.  Will continue to monitor.

## 2016-04-18 NOTE — Progress Notes (Addendum)
Called to see Mark Cabrera for chest pain. He reports rather acute onset chest pain. No dyspnea. Had thoracentesis earlier today. Initial CXR negative for PTx. Given tylenol without relief. EKG shows rate of 60. SBP soft but stable. Lungs with decreased but equal breath sounds bilaterally. Some mild abdominal pain on palpation that radiates to the chest. SPo2 is normal. Check STAT CXR to r/o PTx. He has a large pericardial effusion, noted yesterday, but not consistent with tamponade. No signs of tamponade physiology clinically. Will give morphine for pain relief. He has been anemic. Transfused today, will re-check labs including CBC and metabolic.  Time Spent with Patient: 20 minutes  Pixie Casino, MD, [facility] Attending Cardiologist [place]

## 2016-04-19 ENCOUNTER — Encounter: Payer: Self-pay | Admitting: Physician Assistant

## 2016-04-19 DIAGNOSIS — I309 Acute pericarditis, unspecified: Secondary | ICD-10-CM

## 2016-04-19 LAB — CBC
HEMATOCRIT: 27.8 % — AB (ref 39.0–52.0)
Hemoglobin: 9 g/dL — ABNORMAL LOW (ref 13.0–17.0)
MCH: 26.8 pg (ref 26.0–34.0)
MCHC: 32.4 g/dL (ref 30.0–36.0)
MCV: 82.7 fL (ref 78.0–100.0)
Platelets: 272 10*3/uL (ref 150–400)
RBC: 3.36 MIL/uL — ABNORMAL LOW (ref 4.22–5.81)
RDW: 15 % (ref 11.5–15.5)
WBC: 5 10*3/uL (ref 4.0–10.5)

## 2016-04-19 LAB — BASIC METABOLIC PANEL
Anion gap: 12 (ref 5–15)
BUN: 20 mg/dL (ref 6–20)
CALCIUM: 8.8 mg/dL — AB (ref 8.9–10.3)
CO2: 25 mmol/L (ref 22–32)
Chloride: 96 mmol/L — ABNORMAL LOW (ref 101–111)
Creatinine, Ser: 1.3 mg/dL — ABNORMAL HIGH (ref 0.61–1.24)
GFR calc Af Amer: >60 mL/min (ref >60)
GFR, EST NON AFRICAN AMERICAN: 55 mL/min — AB (ref >60)
GLUCOSE: 237 mg/dL — AB (ref 65–99)
Potassium: 4.3 mmol/L (ref 3.5–5.1)
SODIUM: 133 mmol/L — AB (ref 135–145)

## 2016-04-19 LAB — GLUCOSE, CAPILLARY
GLUCOSE-CAPILLARY: 266 mg/dL — AB (ref 65–99)
GLUCOSE-CAPILLARY: 298 mg/dL — AB (ref 65–99)
GLUCOSE-CAPILLARY: 306 mg/dL — AB (ref 65–99)
Glucose-Capillary: 377 mg/dL — ABNORMAL HIGH (ref 65–99)

## 2016-04-19 LAB — TYPE AND SCREEN
BLOOD PRODUCT EXPIRATION DATE: 201802262359
ISSUE DATE / TIME: 201802061457
Unit Type and Rh: 600

## 2016-04-19 LAB — HEPARIN LEVEL (UNFRACTIONATED): Heparin Unfractionated: 0.41 IU/mL (ref 0.30–0.70)

## 2016-04-19 MED ORDER — INSULIN ASPART 100 UNIT/ML ~~LOC~~ SOLN
5.0000 [IU] | Freq: Once | SUBCUTANEOUS | Status: AC
Start: 2016-04-19 — End: 2016-04-19
  Administered 2016-04-19: 5 [IU] via SUBCUTANEOUS

## 2016-04-19 MED ORDER — COLCHICINE 0.6 MG PO TABS
0.6000 mg | ORAL_TABLET | Freq: Every day | ORAL | Status: DC
  Administered 2016-04-19: 0.6 mg via ORAL
  Filled 2016-04-19: qty 1

## 2016-04-19 NOTE — Progress Notes (Signed)
Progress Note  Patient Name: Mark Cabrera Date of Encounter: 04/19/2016  Primary Cardiologist: Dr. [name]  Subjective   Pt states he had a terrible night with chest pain. The pain is located under his left pectoralis muscle and radiates around to his back. He denies palpitations, dizziness, and lightheadedness. He denies SOB.  Inpatient Medications    Scheduled Meds: . sodium chloride   Intravenous Once  . amiodarone  200 mg Oral BID PC  . aspirin  325 mg Oral Daily  . carvedilol  3.125 mg Oral BID WC  . furosemide  40 mg Intravenous Q8H  . glipiZIDE  10 mg Oral QAC breakfast  . Influenza vac split quadrivalent PF  0.5 mL Intramuscular Tomorrow-1000  . insulin aspart  0-15 Units Subcutaneous TID WC  . pneumococcal 23 valent vaccine  0.5 mL Intramuscular Tomorrow-1000  . potassium chloride  40 mEq Oral BID  . predniSONE  10 mg Oral 3 x daily with food  . [START ON 04/20/2016] predniSONE  10 mg Oral 4X daily taper  . predniSONE  20 mg Oral Nightly  . sodium chloride flush  3 mL Intravenous Q12H  . tamsulosin  0.4 mg Oral QPC supper   Continuous Infusions: . heparin 1,450 Units/hr (04/19/16 0154)   PRN Meds: sodium chloride, acetaminophen, fentaNYL (SUBLIMAZE) injection, magnesium hydroxide, nitroGLYCERIN, ondansetron (ZOFRAN) IV, oxyCODONE, sodium chloride flush   Vital Signs    Vitals:   04/18/16 1936 04/19/16 0045 04/19/16 0608 04/19/16 0624  BP: 102/62 114/62 121/61   Pulse:  63 65   Resp:  15    Temp:  99 F (37.2 C) 98.2 F (36.8 C)   TempSrc:  Oral Oral   SpO2:  99% 100%   Weight:    218 lb 3.2 oz (99 kg)  Height:        Intake/Output Summary (Last 24 hours) at 04/19/16 0753 Last data filed at 04/19/16 0700  Gross per 24 hour  Intake           2688.5 ml  Output             2190 ml  Net            498.5 ml   Filed Weights   04/17/16 0416 04/18/16 0430 04/19/16 0624  Weight: 226 lb 8 oz (102.7 kg) 222 lb 4.8 oz (100.8 kg) 218 lb 3.2 oz (99 kg)     Telemetry    Sinus rhythm vs bouts of atrial fibrillation, rate in the 70s - Personally Reviewed  ECG    04/18/16: Sinus rhythm with 1st degree A-V block (266 ms); Left bundle branch block  04/19/16: Afib  Physical Exam   General: Well developed, well nourished, male appearing in no acute distress. Head: Normocephalic, atraumatic.  Neck: Supple without bruits, no JVD Lungs:  Resp regular and unlabored, CTA. Heart: irregular rhythm, regular rate, S1, S2, Grade 3/6 holosystolic mumur Abdomen: Soft, non-tender, non-distended with normoactive bowel sounds. No hepatomegaly. No rebound/guarding. No obvious abdominal masses. Extremities: No clubbing, cyanosis, Trace edema. Distal pedal pulses are 2+ bilaterally. Neuro: Alert and oriented X 3. Moves all extremities spontaneously. Psych: Normal affect.  Labs    Chemistry Recent Labs Lab 04/18/16 0016 04/18/16 2045 04/19/16 0531  NA 135 135 133*  K 3.1* 3.7 4.3  CL 99* 98* 96*  CO2 26 26 25   GLUCOSE 113* 178* 237*  BUN 25* 22* 20  CREATININE 1.37* 1.43* 1.30*  CALCIUM 9.1 8.7* 8.8*  GFRNONAA  52* 49* 55*  GFRAA >60 57* >60  ANIONGAP 10 11 12      Hematology Recent Labs Lab 04/18/16 0016 04/18/16 2045 04/19/16 0531  WBC 5.3 7.1 5.0  RBC 2.83* 3.25* 3.36*  HGB 7.5* 8.4* 9.0*  HCT 22.9* 26.7* 27.8*  MCV 80.9 82.2 82.7  MCH 26.5 25.8* 26.8  MCHC 32.8 31.5 32.4  RDW 14.8 14.7 15.0  PLT 261 293 272    Cardiac Enzymes Recent Labs Lab 04/18/16 2045  TROPONINI 0.15*    Recent Labs Lab 04/16/16 1715  TROPIPOC 0.09*     BNP Recent Labs Lab 04/16/16 1710  BNP 612.8*     DDimer No results for input(s): DDIMER in the last 168 hours.   Radiology    Dg Chest 1 View  Result Date: 04/18/2016 CLINICAL DATA:  Post left thoracentesis EXAM: CHEST 1 VIEW COMPARISON:  04/16/2016 FINDINGS: Prior valve replacement. Elevation of the left hemidiaphragm with left base atelectasis and small left effusion. No pneumothorax  following thoracentesis. Mild cardiomegaly and vascular congestion. IMPRESSION: No pneumothorax following left thoracentesis. Electronically Signed   By: Rolm Baptise M.D.   On: 04/18/2016 16:25   Dg Chest Port 1 View  Result Date: 04/18/2016 CLINICAL DATA:  Severe LEFT-sided chest pain. Evaluate for pneumothorax. LEFT-sided thoracentesis earlier today. EXAM: PORTABLE CHEST 1 VIEW COMPARISON:  Portable chest radiograph earlier today. Also 04/16/2016. FINDINGS: Cardiomegaly persists. Prior median sternotomy for CABG and valve replacement appear stable. RIGHT lung is clear. Dense retrocardiac region likely representing a combination of atelectasis, infiltrate and effusion. The volume of the effusion is decreased compared with the pre thoracentesis radiograph of 04/16/2016 but is unchanged from the radiograph earlier today. I do not see a pneumothorax. IMPRESSION: Stable exam.  No pneumothorax. Dense retrocardiac region representing atelectasis, infiltrate and effusion, but no significant increase in pleural effusion compared with film earlier today. Electronically Signed   By: Staci Righter M.D.   On: 04/18/2016 20:13   US Thoracentesis Asp Pleural Space W/img Guide  Result Date: 04/18/2016 INDICATION: Patient with history of CHF s/p CABG and AVR three weeks ago now presents with left pleural effusion. Request is made for diagnostic and therapeutic thoracentesis EXAM: ULTRASOUND GUIDED DIAGNOSTIC AND THERAPEUTIC THORACENTESIS MEDICATIONS: 10 mL 1% lidocaine. COMPLICATIONS: None immediate. PROCEDURE: An ultrasound guided thoracentesis was thoroughly discussed with the patient and questions answered. The benefits, risks, alternatives and complications were also discussed. The patient understands and wishes to proceed with the procedure. Written consent was obtained. Ultrasound was performed to localize and mark an adequate pocket of fluid in the left chest. The area was then prepped and draped in the normal  sterile fashion. 1% Lidocaine was used for local anesthesia. Under ultrasound guidance a Safe-T-centesis catheter was introduced. Thoracentesis was performed. The catheter was removed and a dressing applied. FINDINGS: A total of approximately 1.4 liters of amber fluid was removed. Samples were sent to the laboratory as requested by the clinical team. IMPRESSION: Successful ultrasound guided diagnostic and therapeutic left thoracentesis yielding 1.4 liters of pleural fluid. Read by:  Brynda Greathouse PA-C Electronically Signed   By: Sandi Mariscal M.D.   On: 04/18/2016 16:13    Cardiac Studies   Echo 04/17/16: Left ventricle: The cavity size was normal. There was mild focal basal and mild concentric hypertrophy of the septum. Systolic function was normal. The estimated ejection fraction was in the range of 55% to 60%. Wall motion was normal; there were no regional wall motion abnormalities. - Aortic valve: Trileaflet;  normal thickness leaflets. Mean gradient (S): 15 mm Hg. Peak gradient (S): 32 mm Hg. - Mitral valve: There was mild regurgitation. - Left atrium: The atrium was moderately dilated. - Right ventricle: Systolic function was normal. - Right atrium: The atrium was mildly dilated. - Tricuspid valve: There was mild regurgitation. - Pulmonary arteries: Systolic pressure was within the normal range. - Pericardium, extracardiac: A pericardial effusion was identified. There was a left pleural effusion.  Impressions:  - S/P AVR with a bioprosthetic valve with normal transaortic gradients. No AI or paravalvular leak. There is a large circumferential pericardial effusion with largest diameter around the posterior wall measuring 3 cm. There are no significant respiratory variations in the mitral inflow, IVC is borderline dilated. There is RV invagination but normal diastolic filling. There is also a large pleural effusion.  Overall, there are no signs of  hemodynamic compromise, but close follow up with a limited study for evaluation of the pericardial effusion in 2-3 days is recommended.  Patient Profile     66 y.o. 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 Roxan Hockey. He did have atrial fib post-op but was in sinus at time of discharge. He is now admitted with volume overload and atrial fibrillation.   Assessment & Plan    1. Acute on chronic diastolic CHF:  - Good diuresis overnight. Negative ~2.2 liters last 24 hours and is net negative 1.7L; weight 218 lbs (222 lbs) - sCr 1.30 (1.37) - Hypervolemia is resolving. Will continue IV Lasix today and follow renal function closely. Echo 04/17/16 with normal LV systolic function, large pericardial effusion, normally functioning AVR.  2. Atrial fibrillation,paroxysmal:  - telemetry this morning with bouts of rate controlled Afib. - repeat EKG today with Afib, rate controlled - continue po amiodarone and coreg  - Will continue IV heparin today. Will plan long term anti-coagulation. This will need to be coumadin for now. Will start after all invasive procedures performed. Following H/H which is low post-op.   3. Anemia:  - Post-op anemia - Transfused 1 unit of pRBCs yesterday - H/H: 9.0 / 27.8   4. Left pleural effusion:  - Discussed with Dr. Roxan Hockey. He is post-op within last 3 weeks from CABG and AVR.  - IR performed US thoracentesis yesterday with 1.4L removed - fluid labs pending   5. Hypokalemia:  - resolved; K today 4.3 (3.7)  6. Pericardial effusion:  - post-op. No signs of tamponade. Will repeat echo later this week. Will discuss with CT surgery - continue prednisone - will discuss pain management with attending, question indomethacin  7. CAD s/p CABG: stable.  - chest pain this morning, likely secondary to pericardial effusion and recent CABG - question  pericarditis - Continue current meds.   8. Aortic valve disease: s/p AVR. Functioning well by echo 04/17/16   Signed, Minette Brine , PA-C 7:53 AM 04/19/2016   I have personally seen and examined this patient with Doreene Adas, PA-C. I agree with the assessment and plan as outlined above. He is diuresing well. S/p thoracentesis yesterday left pleural effusion. Continue Lasix today. Continue prednisone for pericarditis (post pericardiotomy). Will start colchicine.   Lauree Chandler 04/19/2016 10:07 AM

## 2016-04-19 NOTE — Progress Notes (Signed)
[place] for heparin  Indication: atrial fibrillation  Allergies  Allergen Reactions  . Crab (Diagnostic) Anaphylaxis  . Morphine And Related     hallucinations  . Statins   . Penicillins Other (See Comments)    Has patient had a PCN reaction causing immediate rash, facial/tongue/throat swelling, SOB or lightheadedness with hypotension: Unk Has patient had a PCN reaction causing severe rash involving mucus membranes or skin necrosis: Unk Has patient had a PCN reaction that required hospitalization: Unk Has patient had a PCN reaction occurring within the last 10 years: No If all of the above answers are "NO", then may proceed with Cephalosporin use.     Patient Measurements: Height: 5\' 8"  (172.7 cm) Weight: 218 lb 3.2 oz (99 kg) (scale A) IBW/kg (Calculated) : 68.4 Heparin Dosing Weight: 90kg  Vital Signs: Temp: 98.2 F (36.8 C) (02/07 0608) Temp Source: Oral (02/07 0608) BP: 111/97 (02/07 0858) Pulse Rate: 57 (02/07 0858)  Labs:  Recent Labs  04/16/16 2319  04/17/16 1546 04/18/16 0013 04/18/16 0016 04/18/16 2045 04/19/16 0531  HGB  --   < >  --   --  7.5* 8.4* 9.0*  HCT  --   < >  --   --  22.9* 26.7* 27.8*  PLT  --   < >  --   --  261 293 272  APTT 58*  --   --   --   --   --   --   LABPROT 16.4*  --   --   --   --   --   --   INR 1.32  --   --   --   --   --   --   HEPARINUNFRC  --   < > 0.30 0.39  --   --  0.41  CREATININE  --   < >  --   --  1.37* 1.43* 1.30*  TROPONINI  --   --   --   --   --  0.15*  --   < > = values in this interval not displayed.  Estimated Creatinine Clearance: 62.9 mL/min (by C-G formula based on SCr of 1.3 mg/dL (H)).  Assessment: 66 yo male continues on heparin for afib / AVR Heparin level therapeutic this AM CBC stable  Goal of Therapy:  Heparin level 0.3-0.7 units/ml Monitor platelets by anticoagulation protocol: Yes   Plan:  Continue heparin at 1450 units / hr Follow up AM  labs  Thank you Anette Guarneri, PharmD [phone]   04/19/2016 10:10 AM

## 2016-04-19 NOTE — Significant Event (Signed)
Rapid Response Event Note RN called for CP Overview: Time Called: 1917 Arrival Time: 1919 Event Type: Cardiac  Initial Focused Assessment: On arrival pt alert and oriented, skin warm and dry, pt given nitro x1 pta with no relief, SBP 88 after nitro. Nitro x2 given after repeat SBP 102, pt report Left side chest stabbing pain 6/10, BP 102/62, HR 62, RR 16, 100% 3L [place]. Lungs diminished bilaterally. Pt had a thoracentesis approx 1600 post CXR showed no concerns. Hinton Dyer PA and Dr. Debara Pickett from cardiology at bedside.  Interventions: CXR, BMP, CBC, Troponin, pain medication. Manual BP 105/54, HR 62, RR 16, 100% 3L  Plan of Care (if not transferred): Continue to monitor pt, Fabio Bering RN aware and advised to call for any concerns.  Event Summary: Name of Physician Notified: Dr. Debara Pickett  at  (PTA RRT )    at    Outcome: Stayed in room and stabalized  Event End Time: 2020  [place] Pickup

## 2016-04-19 NOTE — Progress Notes (Signed)
Pt in bed, c/o 7/10 pain on his left rib cage area, fentanyl 49mcg given. Will monitor.

## 2016-04-20 ENCOUNTER — Inpatient Hospital Stay (HOSPITAL_COMMUNITY): Payer: Medicare Other

## 2016-04-20 ENCOUNTER — Other Ambulatory Visit (HOSPITAL_COMMUNITY): Payer: Medicare Other

## 2016-04-20 DIAGNOSIS — I313 Pericardial effusion (noninflammatory): Secondary | ICD-10-CM

## 2016-04-20 LAB — CBC
HCT: 27.9 % — ABNORMAL LOW (ref 39.0–52.0)
HEMATOCRIT: 27.2 % — AB (ref 39.0–52.0)
HEMOGLOBIN: 8.6 g/dL — AB (ref 13.0–17.0)
HEMOGLOBIN: 8.9 g/dL — AB (ref 13.0–17.0)
MCH: 26.1 pg (ref 26.0–34.0)
MCH: 26.6 pg (ref 26.0–34.0)
MCHC: 31.6 g/dL (ref 30.0–36.0)
MCHC: 31.9 g/dL (ref 30.0–36.0)
MCV: 82.4 fL (ref 78.0–100.0)
MCV: 83.3 fL (ref 78.0–100.0)
PLATELETS: 261 10*3/uL (ref 150–400)
Platelets: 254 10*3/uL (ref 150–400)
RBC: 3.3 MIL/uL — ABNORMAL LOW (ref 4.22–5.81)
RBC: 3.35 MIL/uL — AB (ref 4.22–5.81)
RDW: 14.7 % (ref 11.5–15.5)
RDW: 15.3 % (ref 11.5–15.5)
WBC: 7.6 10*3/uL (ref 4.0–10.5)
WBC: 7.8 10*3/uL (ref 4.0–10.5)

## 2016-04-20 LAB — GLUCOSE, CAPILLARY
GLUCOSE-CAPILLARY: 297 mg/dL — AB (ref 65–99)
GLUCOSE-CAPILLARY: 288 mg/dL — AB (ref 65–99)
Glucose-Capillary: 227 mg/dL — ABNORMAL HIGH (ref 65–99)
Glucose-Capillary: 325 mg/dL — ABNORMAL HIGH (ref 65–99)

## 2016-04-20 LAB — ECHOCARDIOGRAM LIMITED
Height: 68 in
WEIGHTICAEL: 3449.6 [oz_av]

## 2016-04-20 LAB — HEPARIN LEVEL (UNFRACTIONATED): Heparin Unfractionated: 0.42 IU/mL (ref 0.30–0.70)

## 2016-04-20 MED ORDER — VANCOMYCIN HCL IN DEXTROSE 1-5 GM/200ML-% IV SOLN
1000.0000 mg | INTRAVENOUS | Status: DC
Start: 2016-04-21 — End: 2016-04-20
  Filled 2016-04-20: qty 200

## 2016-04-20 MED ORDER — COLCHICINE 0.6 MG PO TABS
0.6000 mg | ORAL_TABLET | Freq: Two times a day (BID) | ORAL | Status: DC
  Administered 2016-04-20 – 2016-04-28 (×16): 0.6 mg via ORAL
  Filled 2016-04-20 (×16): qty 1

## 2016-04-20 MED ORDER — VANCOMYCIN HCL IN DEXTROSE 1-5 GM/200ML-% IV SOLN
1000.0000 mg | INTRAVENOUS | Status: AC
  Administered 2016-04-21: 1000 mg via INTRAVENOUS
  Filled 2016-04-20: qty 200

## 2016-04-20 NOTE — Progress Notes (Addendum)
Progress Note  Patient Name: Mark Cabrera Date of Encounter: 04/20/2016  Primary Cardiologist: Dr. [name]  Subjective   Still with pleuritic chest and right scapular pain. Pain is positional. Hurts to lay down, improved sitting up. Still short of breath on RA. Now using [place].   Inpatient Medications    Scheduled Meds: . sodium chloride   Intravenous Once  . amiodarone  200 mg Oral BID PC  . aspirin  325 mg Oral Daily  . carvedilol  3.125 mg Oral BID WC  . colchicine  0.6 mg Oral Daily  . furosemide  40 mg Intravenous Q8H  . glipiZIDE  10 mg Oral QAC breakfast  . Influenza vac split quadrivalent PF  0.5 mL Intramuscular Tomorrow-1000  . insulin aspart  0-15 Units Subcutaneous TID WC  . pneumococcal 23 valent vaccine  0.5 mL Intramuscular Tomorrow-1000  . potassium chloride  40 mEq Oral BID  . predniSONE  10 mg Oral 4X daily taper  . sodium chloride flush  3 mL Intravenous Q12H  . tamsulosin  0.4 mg Oral QPC supper   Continuous Infusions: . heparin 1,450 Units/hr (04/19/16 2005)   PRN Meds: sodium chloride, acetaminophen, fentaNYL (SUBLIMAZE) injection, magnesium hydroxide, nitroGLYCERIN, ondansetron (ZOFRAN) IV, oxyCODONE, sodium chloride flush   Vital Signs    Vitals:   04/19/16 1600 04/19/16 1938 04/20/16 0028 04/20/16 0531  BP: 126/65 108/74 108/70 106/65  Pulse:  60 72 79  Resp:  18 18 18   Temp: 99 F (37.2 C) 98.2 F (36.8 C) 98.1 F (36.7 C) 97.8 F (36.6 C)  TempSrc: Oral Oral Oral Oral  SpO2:  99% 98% 99%  Weight:    215 lb 9.6 oz (97.8 kg)  Height:        Intake/Output Summary (Last 24 hours) at 04/20/16 0833 Last data filed at 04/20/16 0602  Gross per 24 hour  Intake           1439.5 ml  Output             2000 ml  Net           -560.5 ml   Filed Weights   04/18/16 0430 04/19/16 0624 04/20/16 0531  Weight: 222 lb 4.8 oz (100.8 kg) 218 lb 3.2 oz (99 kg) 215 lb 9.6 oz (97.8 kg)    Telemetry    Atrial fibrillation w/ a CVR - Personally  Reviewed   Physical Exam   GEN: No acute distress.   Neck: No JVD Cardiac: irreguarlly irregular rthythm, 3/6 holosystolic murmur Respiratory: Clear to auscultation bilaterally. GI: Soft, nontender, non-distended  MS: 2+ pretibial pitting edema on the left, 1+ edema on the right edema; No deformity. Neuro:  Nonfocal  Psych: Normal affect   Labs    Chemistry Recent Labs Lab 04/18/16 0016 04/18/16 2045 04/19/16 0531  NA 135 135 133*  K 3.1* 3.7 4.3  CL 99* 98* 96*  CO2 26 26 25   GLUCOSE 113* 178* 237*  BUN 25* 22* 20  CREATININE 1.37* 1.43* 1.30*  CALCIUM 9.1 8.7* 8.8*  GFRNONAA 52* 49* 55*  GFRAA >60 57* >60  ANIONGAP 10 11 12      Hematology Recent Labs Lab 04/18/16 2045 04/19/16 0531 04/20/16 0505  WBC 7.1 5.0 7.6  RBC 3.25* 3.36* 3.30*  HGB 8.4* 9.0* 8.6*  HCT 26.7* 27.8* 27.2*  MCV 82.2 82.7 82.4  MCH 25.8* 26.8 26.1  MCHC 31.5 32.4 31.6  RDW 14.7 15.0 14.7  PLT 293 272 254  Cardiac Enzymes Recent Labs Lab 04/18/16 2045  TROPONINI 0.15*    Recent Labs Lab 04/16/16 1715  TROPIPOC 0.09*     BNP Recent Labs Lab 04/16/16 1710  BNP 612.8*     DDimer No results for input(s): DDIMER in the last 168 hours.   Radiology    Dg Chest 1 View  Result Date: 04/18/2016 CLINICAL DATA:  Post left thoracentesis EXAM: CHEST 1 VIEW COMPARISON:  04/16/2016 FINDINGS: Prior valve replacement. Elevation of the left hemidiaphragm with left base atelectasis and small left effusion. No pneumothorax following thoracentesis. Mild cardiomegaly and vascular congestion. IMPRESSION: No pneumothorax following left thoracentesis. Electronically Signed   By: Rolm Baptise M.D.   On: 04/18/2016 16:25   Dg Chest Port 1 View  Result Date: 04/18/2016 CLINICAL DATA:  Severe LEFT-sided chest pain. Evaluate for pneumothorax. LEFT-sided thoracentesis earlier today. EXAM: PORTABLE CHEST 1 VIEW COMPARISON:  Portable chest radiograph earlier today. Also 04/16/2016. FINDINGS:  Cardiomegaly persists. Prior median sternotomy for CABG and valve replacement appear stable. RIGHT lung is clear. Dense retrocardiac region likely representing a combination of atelectasis, infiltrate and effusion. The volume of the effusion is decreased compared with the pre thoracentesis radiograph of 04/16/2016 but is unchanged from the radiograph earlier today. I do not see a pneumothorax. IMPRESSION: Stable exam.  No pneumothorax. Dense retrocardiac region representing atelectasis, infiltrate and effusion, but no significant increase in pleural effusion compared with film earlier today. Electronically Signed   By: Staci Righter M.D.   On: 04/18/2016 20:13   US Thoracentesis Asp Pleural Space W/img Guide  Result Date: 04/18/2016 INDICATION: Patient with history of CHF s/p CABG and AVR three weeks ago now presents with left pleural effusion. Request is made for diagnostic and therapeutic thoracentesis EXAM: ULTRASOUND GUIDED DIAGNOSTIC AND THERAPEUTIC THORACENTESIS MEDICATIONS: 10 mL 1% lidocaine. COMPLICATIONS: None immediate. PROCEDURE: An ultrasound guided thoracentesis was thoroughly discussed with the patient and questions answered. The benefits, risks, alternatives and complications were also discussed. The patient understands and wishes to proceed with the procedure. Written consent was obtained. Ultrasound was performed to localize and mark an adequate pocket of fluid in the left chest. The area was then prepped and draped in the normal sterile fashion. 1% Lidocaine was used for local anesthesia. Under ultrasound guidance a Safe-T-centesis catheter was introduced. Thoracentesis was performed. The catheter was removed and a dressing applied. FINDINGS: A total of approximately 1.4 liters of amber fluid was removed. Samples were sent to the laboratory as requested by the clinical team. IMPRESSION: Successful ultrasound guided diagnostic and therapeutic left thoracentesis yielding 1.4 liters of pleural  fluid. Read by:  Brynda Greathouse PA-C Electronically Signed   By: Sandi Mariscal M.D.   On: 04/18/2016 16:13      Patient Profile     66 y.o. male with history of AVR 03/31/16 with bovine pericardial valve, left atrial appendage clipping and bypass with SVG to OM1 and SVG to PDA per Dr. Roxan Hockey. He did have atrial fib post-op but was in sinus at time of discharge. He is now readmitted with volume overload and recurrent atrial fibrillation.   Assessment & Plan    1. Acute on chronic diastolic CHF:  - Good diuresis overnight. Negative ~2.0 liters last 24 hours and is net negative 2.7L; weight is down from 226 lb>>215 lb - sCr 1.30  - Hypervolemia is resolving but still with bilateral pretibial pitting edema, L>R. Will continue IV Lasix today and follow renal function closely. Echo 04/17/16 with normal LV systolic  function, large pericardial effusion, normally functioning AVR. We will repeat a BMP today.   2. Atrial fibrillation,paroxysmal:  - telemetry this morning with bouts of rate controlled Afib. - repeat EKG today with Afib, rate controlled - continue po amiodarone and coreg  - Will continue IV heparin today. Will plan long term anti-coagulation. This will need to be coumadin for now. Will start after all invasive procedures performed. Following H/H which is low post-op.   3. Anemia: - Post-op anemia - Transfused 1 unit of pRBCs 04/18/16 - H/H: 8.6/27.2  4. Left pleural effusion: - Discussed with Dr. Roxan Hockey. He is post-op within last 3 weeks from CABG and AVR.  - IR performed US thoracentesis 04/18/16 with 1.4L removed - fluid labs pending   5. Hypokalemia: - resolved and stable the past 2 days, repeat BMP today  6. Pericardial effusion: - post-op. No signs of tamponade. Will repeat echo later this week. Will discuss with CT surgery - continue prednisone  7. CAD s/p CABG: SVG to OM1 and SVG to PDA. Stable w/o angina  8. Aortic valve disease: s/p AVR. Functioning  well by echo 04/17/16  9. Pericarditis: in the setting of recent pericardiotomy. Still with pleuritic chest and right scapular pain. He is on prednisone. Colchicine was added yesterday, but only once daily. We will increase his colchicine to BID dosing.     Signed, Lyda Jester, PA-C  04/20/2016, 8:33 AM    I have personally seen and examined this patient with Lyda Jester, PA-C. I agree with the assessment and plan as outlined above. He is doing well this am. Will continue to treat his volume overload with IV Lasix. He is on Prednisone and colchicine for his pericardial effusion and pleural effusion post-op. Repeat echo today to assess size of pericardial effusion and chest x-ray to assess size of pleural effusion. Rate controlled atrial fib this am.   Addendum: Echo with enlarged pericardial effusion. It now appears large and there are early features of tamponade. I have discussed with Dr. Roxan Hockey. The patient is currently stable. Will likely proceed with pericardial window tomorrow.  Lauree Chandler  04/20/2016 10:01 AM

## 2016-04-20 NOTE — Progress Notes (Signed)
[place] for heparin  Indication: atrial fibrillation  Allergies  Allergen Reactions  . Crab (Diagnostic) Anaphylaxis  . Morphine And Related     hallucinations  . Statins   . Penicillins Other (See Comments)    Has patient had a PCN reaction causing immediate rash, facial/tongue/throat swelling, SOB or lightheadedness with hypotension: Unk Has patient had a PCN reaction causing severe rash involving mucus membranes or skin necrosis: Unk Has patient had a PCN reaction that required hospitalization: Unk Has patient had a PCN reaction occurring within the last 10 years: No If all of the above answers are "NO", then may proceed with Cephalosporin use.     Patient Measurements: Height: 5\' 8"  (172.7 cm) Weight: 215 lb 9.6 oz (97.8 kg) IBW/kg (Calculated) : 68.4 Heparin Dosing Weight: 90kg  Vital Signs: Temp: 97.8 F (36.6 C) (02/08 0531) Temp Source: Oral (02/08 0531) BP: 106/65 (02/08 0531) Pulse Rate: 79 (02/08 0531)  Labs:  Recent Labs  04/18/16 0013  04/18/16 0016 04/18/16 2045 04/19/16 0531 04/20/16 0505  HGB  --   < > 7.5* 8.4* 9.0* 8.6*  HCT  --   < > 22.9* 26.7* 27.8* 27.2*  PLT  --   < > 261 293 272 254  HEPARINUNFRC 0.39  --   --   --  0.41 0.42  CREATININE  --   --  1.37* 1.43* 1.30*  --   TROPONINI  --   --   --  0.15*  --   --   < > = values in this interval not displayed.  Estimated Creatinine Clearance: 62.5 mL/min (by C-G formula based on SCr of 1.3 mg/dL (H)).  Assessment: 66 yo male continues on heparin for afib / AVR Heparin level therapeutic this AM CBC stable  Goal of Therapy:  Heparin level 0.3-0.7 units/ml Monitor platelets by anticoagulation protocol: Yes   Plan:  Continue heparin at 1450 units / hr Follow up AM labs  Thank you Anette Guarneri, PharmD [phone]   04/20/2016 9:42 AM

## 2016-04-20 NOTE — Progress Notes (Signed)
  Echocardiogram 2D Echocardiogram limited has been performed.  Bobbye Charleston 04/20/2016, 11:04 AM

## 2016-04-20 NOTE — Progress Notes (Signed)
      [name]       [name]             [phone]      Does not feel as well today. Tired and subscapular back pain- has eased off a ittle with meds  BP 106/65 (BP Location: Right Arm)   Pulse 79   Temp 97.8 F (36.6 C) (Oral)   Resp 18   Ht 5\' 8"  (1.727 m)   Wt 215 lb 9.6 oz (97.8 kg)   SpO2 99%   BMI 32.78 kg/m    Intake/Output Summary (Last 24 hours) at 04/20/16 1400 Last data filed at 04/20/16 1054  Gross per 24 hour  Intake             1338 ml  Output             1880 ml  Net             -542 ml   Exam: BP 106/65 (BP Location: Right Arm)   Pulse 79   Temp 97.8 F (36.6 C) (Oral)   Resp 18   Ht 5\' 8"  (1.727 m)   Wt 215 lb 9.6 oz (97.8 kg)   SpO2 99%   BMI 32.78 kg/m  Alert and oriented x 3 Sternal incision c,d,i Lungs improved BS left base Cardiac slightly irregular, no rub  Repeat Echo Study Conclusions  - Left ventricle: The cavity size was normal. There was mild   concentric hypertrophy. Systolic function was normal. The   estimated ejection fraction was in the range of 50% to 55%. Wall   motion was normal; there were no regional wall motion   abnormalities. - Ventricular septum: Septal motion showed paradox. - Aortic valve: A bioprosthesis was present and functioning   normally. Valve area (VTI): 2.19 cm^2. Valve area (Vmax): 2.15   cm^2. Valve area (Vmean): 2.05 cm^2. - Left atrium: The atrium was severely dilated. - Pericardium, extracardiac: A large pericardial effusion was   identified posterior to the heart and circumferential to the   heart. The fluid contained focal strands. There was mildright   ventricular chamber collapse for less than 50% of the cardiac   cycle. Now in atrial fibrillation, hard to evaluate for   respiratory flow variation. Features were probably consistent   with mild tamponade physiology. There was a left pleural   effusion.  IMPRESSION: Postpericardiotomy syndrome post AVR,  CABG  His pericardial effusion has gotten a little larger and is now showing some early signs of potential tamponade. He is not in any distress at this point. I think the best option is to take him to the OR tomorrow and do a subxiphoid pericardial window.   I informed him of the indications, risks, benefits and alternatives. He understands the risks include but are not limited to death, MI, DVT, PE, cardiac injury, bleeding, possible need for transfusion, infection, as well as the possibility of other unforeseeable complications. He accepts the risks and agrees to proceed.  Will stop heparin drip at 2 AM prior to surgery  I discussed this plan with Dr. Angela Cox C. Roxan Hockey, MD [facility] [phone]

## 2016-04-20 NOTE — Progress Notes (Signed)
Results for MADS, LORENC (MRN [id number]) as of 04/20/2016 09:37  Ref. Range 04/19/2016 07:38 04/19/2016 11:34 04/19/2016 16:55 04/19/2016 21:09 04/20/2016 07:42  Glucose-Capillary Latest Ref Range: 65 - 99 mg/dL 266 (H) 298 (H) 377 (H) 306 (H) 297 (H)  CBGs continue to be greater than 180 mg/dl. Recommend starting Lantus 15 units daily if blood sugars continue to be elevated. Will continue to monitor blood sugars while in the hospital. Harvel Ricks RN BSN CDE

## 2016-04-20 NOTE — Care Management Important Message (Signed)
Important Message  Patient Details  Name: Mark Cabrera MRN: [id number] Date of Birth: [birth year]   Medicare Important Message Given:  Yes    [name] 04/20/2016, 11:45 AM

## 2016-04-21 ENCOUNTER — Inpatient Hospital Stay (HOSPITAL_COMMUNITY): Payer: Medicare Other

## 2016-04-21 ENCOUNTER — Inpatient Hospital Stay (HOSPITAL_COMMUNITY): Payer: Medicare Other | Admitting: Certified Registered Nurse Anesthetist

## 2016-04-21 ENCOUNTER — Encounter (HOSPITAL_COMMUNITY): Payer: Self-pay | Admitting: *Deleted

## 2016-04-21 ENCOUNTER — Encounter (HOSPITAL_COMMUNITY)
Admission: EM | Disposition: A | Discharge status: Home Health | Payer: Self-pay | Source: Home / Self Care | Attending: Cardiovascular Disease

## 2016-04-21 DIAGNOSIS — I313 Pericardial effusion (noninflammatory): Secondary | ICD-10-CM

## 2016-04-21 DIAGNOSIS — I481 Persistent atrial fibrillation: Principal | ICD-10-CM

## 2016-04-21 DIAGNOSIS — I359 Nonrheumatic aortic valve disorder, unspecified: Secondary | ICD-10-CM

## 2016-04-21 HISTORY — PX: TEE WITHOUT CARDIOVERSION: SHX5443

## 2016-04-21 HISTORY — PX: SUBXYPHOID PERICARDIAL WINDOW: SHX5075

## 2016-04-21 LAB — COMPREHENSIVE METABOLIC PANEL
ALT: 20 U/L (ref 17–63)
AST: 25 U/L (ref 15–41)
Albumin: 3.1 g/dL — ABNORMAL LOW (ref 3.5–5.0)
Alkaline Phosphatase: 90 U/L (ref 38–126)
Anion gap: 12 (ref 5–15)
BILIRUBIN TOTAL: 0.5 mg/dL (ref 0.3–1.2)
BUN: 20 mg/dL (ref 6–20)
CHLORIDE: 96 mmol/L — AB (ref 101–111)
CO2: 24 mmol/L (ref 22–32)
CREATININE: 1.45 mg/dL — AB (ref 0.61–1.24)
Calcium: 8.9 mg/dL (ref 8.9–10.3)
GFR, EST AFRICAN AMERICAN: 56 mL/min — AB (ref >60)
GFR, EST NON AFRICAN AMERICAN: 48 mL/min — AB (ref >60)
Glucose, Bld: 370 mg/dL — ABNORMAL HIGH (ref 65–99)
POTASSIUM: 4.2 mmol/L (ref 3.5–5.1)
Sodium: 132 mmol/L — ABNORMAL LOW (ref 135–145)
TOTAL PROTEIN: 6.6 g/dL (ref 6.5–8.1)

## 2016-04-21 LAB — URINALYSIS, ROUTINE W REFLEX MICROSCOPIC
Bilirubin Urine: NEGATIVE
Hgb urine dipstick: NEGATIVE
KETONES UR: NEGATIVE mg/dL
LEUKOCYTES UA: NEGATIVE
Nitrite: NEGATIVE
PH: 6 (ref 5.0–8.0)
Protein, ur: NEGATIVE mg/dL
RBC / HPF: NONE SEEN RBC/hpf (ref 0–5)
SPECIFIC GRAVITY, URINE: 1.008 (ref 1.005–1.030)

## 2016-04-21 LAB — PROTIME-INR
INR: 1.14
PROTHROMBIN TIME: 14.7 s (ref 11.4–15.2)

## 2016-04-21 LAB — GLUCOSE, CAPILLARY
GLUCOSE-CAPILLARY: 255 mg/dL — AB (ref 65–99)
GLUCOSE-CAPILLARY: 198 mg/dL — AB (ref 65–99)
GLUCOSE-CAPILLARY: 237 mg/dL — AB (ref 65–99)
Glucose-Capillary: 249 mg/dL — ABNORMAL HIGH (ref 65–99)
Glucose-Capillary: 247 mg/dL — ABNORMAL HIGH (ref 65–99)

## 2016-04-21 LAB — BASIC METABOLIC PANEL
ANION GAP: 12 (ref 5–15)
BUN: 21 mg/dL — ABNORMAL HIGH (ref 6–20)
CO2: 25 mmol/L (ref 22–32)
Calcium: 8.6 mg/dL — ABNORMAL LOW (ref 8.9–10.3)
Chloride: 96 mmol/L — ABNORMAL LOW (ref 101–111)
Creatinine, Ser: 1.22 mg/dL (ref 0.61–1.24)
GFR calc Af Amer: >60 mL/min (ref >60)
GFR, EST NON AFRICAN AMERICAN: 60 mL/min — AB (ref >60)
GLUCOSE: 309 mg/dL — AB (ref 65–99)
POTASSIUM: 4.4 mmol/L (ref 3.5–5.1)
Sodium: 133 mmol/L — ABNORMAL LOW (ref 135–145)

## 2016-04-21 LAB — TYPE AND SCREEN
ABO/RH(D): A NEG
ANTIBODY SCREEN: NEGATIVE

## 2016-04-21 LAB — CBC
HCT: 27.1 % — ABNORMAL LOW (ref 39.0–52.0)
Hemoglobin: 8.5 g/dL — ABNORMAL LOW (ref 13.0–17.0)
MCH: 26.2 pg (ref 26.0–34.0)
MCHC: 31.4 g/dL (ref 30.0–36.0)
MCV: 83.4 fL (ref 78.0–100.0)
PLATELETS: 267 10*3/uL (ref 150–400)
RBC: 3.25 MIL/uL — AB (ref 4.22–5.81)
RDW: 15.1 % (ref 11.5–15.5)
WBC: 8.2 10*3/uL (ref 4.0–10.5)

## 2016-04-21 LAB — MAGNESIUM: MAGNESIUM: 1.8 mg/dL (ref 1.7–2.4)

## 2016-04-21 SURGERY — CREATION, PERICARDIAL WINDOW, SUBXIPHOID APPROACH
Anesthesia: General | Site: Chest

## 2016-04-21 MED ORDER — 0.9 % SODIUM CHLORIDE (POUR BTL) OPTIME
TOPICAL | Status: DC | PRN
  Administered 2016-04-21: 2000 mL

## 2016-04-21 MED ORDER — VANCOMYCIN HCL IN DEXTROSE 1-5 GM/200ML-% IV SOLN
1000.0000 mg | Freq: Two times a day (BID) | INTRAVENOUS | Status: AC
  Administered 2016-04-22: 1000 mg via INTRAVENOUS
  Filled 2016-04-21: qty 200

## 2016-04-21 MED ORDER — FENTANYL CITRATE (PF) 100 MCG/2ML IJ SOLN
INTRAMUSCULAR | Status: DC | PRN
Start: 2016-04-21 — End: 2016-04-21
  Administered 2016-04-21: 50 ug via INTRAVENOUS
  Administered 2016-04-21: 100 ug via INTRAVENOUS

## 2016-04-21 MED ORDER — HEPARIN (PORCINE) IN NACL 100-0.45 UNIT/ML-% IJ SOLN
1600.0000 [IU]/h | INTRAMUSCULAR | Status: DC
  Administered 2016-04-22: 1450 [IU]/h via INTRAVENOUS
  Administered 2016-04-22: 1350 [IU]/h via INTRAVENOUS
  Administered 2016-04-24 – 2016-04-25 (×2): 1600 [IU]/h via INTRAVENOUS
  Filled 2016-04-21 (×5): qty 250

## 2016-04-21 MED ORDER — FENTANYL CITRATE (PF) 250 MCG/5ML IJ SOLN
INTRAMUSCULAR | Status: AC
  Filled 2016-04-21: qty 5

## 2016-04-21 MED ORDER — ACETAMINOPHEN 500 MG PO TABS
1000.0000 mg | ORAL_TABLET | Freq: Four times a day (QID) | ORAL | Status: AC
  Administered 2016-04-22 – 2016-04-26 (×12): 1000 mg via ORAL
  Filled 2016-04-21 (×13): qty 2

## 2016-04-21 MED ORDER — ROCURONIUM BROMIDE 100 MG/10ML IV SOLN
INTRAVENOUS | Status: DC | PRN
  Administered 2016-04-21: 50 mg via INTRAVENOUS

## 2016-04-21 MED ORDER — LIDOCAINE HCL (CARDIAC) 20 MG/ML IV SOLN
INTRAVENOUS | Status: DC | PRN
  Administered 2016-04-21: 30 mg via INTRAVENOUS

## 2016-04-21 MED ORDER — HEMOSTATIC AGENTS (NO CHARGE) OPTIME
TOPICAL | Status: DC | PRN
  Administered 2016-04-21: 1 via TOPICAL

## 2016-04-21 MED ORDER — OXYCODONE HCL 5 MG PO TABS
5.0000 mg | ORAL_TABLET | ORAL | Status: DC | PRN
  Administered 2016-04-21 – 2016-04-22 (×2): 10 mg via ORAL
  Filled 2016-04-21 (×2): qty 2

## 2016-04-21 MED ORDER — INSULIN ASPART 100 UNIT/ML ~~LOC~~ SOLN
SUBCUTANEOUS | Status: AC
  Filled 2016-04-21: qty 1

## 2016-04-21 MED ORDER — LACTATED RINGERS IV SOLN
INTRAVENOUS | Status: DC
  Administered 2016-04-21 – 2016-04-24 (×2): via INTRAVENOUS

## 2016-04-21 MED ORDER — HYDROMORPHONE HCL 1 MG/ML IJ SOLN
0.2500 mg | INTRAMUSCULAR | Status: DC | PRN
  Administered 2016-04-21 (×2): 0.5 mg via INTRAVENOUS

## 2016-04-21 MED ORDER — HYDROMORPHONE HCL 2 MG/ML IJ SOLN
INTRAMUSCULAR | Status: AC
  Filled 2016-04-21: qty 1

## 2016-04-21 MED ORDER — SODIUM CHLORIDE 0.9 % IV SOLN
30.0000 meq | Freq: Every day | INTRAVENOUS | Status: DC | PRN
  Filled 2016-04-21: qty 15

## 2016-04-21 MED ORDER — PROMETHAZINE HCL 25 MG/ML IJ SOLN: 6.2500 mg | INTRAMUSCULAR | Status: DC | PRN

## 2016-04-21 MED ORDER — TRAMADOL HCL 50 MG PO TABS
50.0000 mg | ORAL_TABLET | Freq: Four times a day (QID) | ORAL | Status: DC | PRN
  Administered 2016-04-22: 100 mg via ORAL
  Filled 2016-04-21: qty 2

## 2016-04-21 MED ORDER — POTASSIUM CHLORIDE CRYS ER 20 MEQ PO TBCR
20.0000 meq | EXTENDED_RELEASE_TABLET | Freq: Two times a day (BID) | ORAL | Status: DC
  Administered 2016-04-21 – 2016-04-23 (×4): 20 meq via ORAL
  Filled 2016-04-21 (×4): qty 1

## 2016-04-21 MED ORDER — LACTATED RINGERS IV SOLN
INTRAVENOUS | Status: DC | PRN
  Administered 2016-04-21: 16:00:00 via INTRAVENOUS

## 2016-04-21 MED ORDER — FUROSEMIDE 10 MG/ML IJ SOLN
40.0000 mg | Freq: Two times a day (BID) | INTRAMUSCULAR | Status: DC
  Administered 2016-04-22 – 2016-04-23 (×3): 40 mg via INTRAVENOUS
  Filled 2016-04-21 (×3): qty 4

## 2016-04-21 MED ORDER — ONDANSETRON HCL 4 MG/2ML IJ SOLN: 4.0000 mg | Freq: Four times a day (QID) | INTRAMUSCULAR | Status: DC | PRN

## 2016-04-21 MED ORDER — SENNOSIDES-DOCUSATE SODIUM 8.6-50 MG PO TABS
1.0000 | ORAL_TABLET | Freq: Every day | ORAL | Status: DC
  Administered 2016-04-21 – 2016-04-27 (×4): 1 via ORAL
  Filled 2016-04-21 (×4): qty 1

## 2016-04-21 MED ORDER — PROPOFOL 10 MG/ML IV BOLUS
INTRAVENOUS | Status: AC
  Filled 2016-04-21: qty 20

## 2016-04-21 MED ORDER — BISACODYL 5 MG PO TBEC
10.0000 mg | DELAYED_RELEASE_TABLET | Freq: Every day | ORAL | Status: DC
  Administered 2016-04-21 – 2016-04-28 (×5): 10 mg via ORAL
  Filled 2016-04-21 (×6): qty 2

## 2016-04-21 MED ORDER — ACETAMINOPHEN 160 MG/5ML PO SOLN: 1000.0000 mg | Freq: Four times a day (QID) | ORAL | Status: AC

## 2016-04-21 MED ORDER — SUGAMMADEX SODIUM 200 MG/2ML IV SOLN
INTRAVENOUS | Status: DC | PRN
  Administered 2016-04-21: 193.4 mg via INTRAVENOUS

## 2016-04-21 MED ORDER — PROPOFOL 10 MG/ML IV BOLUS
INTRAVENOUS | Status: DC | PRN
  Administered 2016-04-21: 100 mg via INTRAVENOUS

## 2016-04-21 SURGICAL SUPPLY — 52 items
ADH SKN CLS LQ APL DERMABOND (GAUZE/BANDAGES/DRESSINGS) ×1
CANISTER SUCTION 2500CC (MISCELLANEOUS) ×3 IMPLANT
CATH THORACIC 28FR (CATHETERS) IMPLANT
CATH THORACIC 28FR RT ANG (CATHETERS) IMPLANT
CATH THORACIC 36FR (CATHETERS) IMPLANT
CATH THORACIC 36FR RT ANG (CATHETERS) IMPLANT
CONT SPEC 4OZ CLIKSEAL STRL BL (MISCELLANEOUS) ×6 IMPLANT
COVER SURGICAL LIGHT HANDLE (MISCELLANEOUS) ×2 IMPLANT
DERMABOND ADHESIVE PROPEN (GAUZE/BANDAGES/DRESSINGS) ×2
DERMABOND ADVANCED .7 DNX6 (GAUZE/BANDAGES/DRESSINGS) IMPLANT
DRAIN CHANNEL 19F RND (DRAIN) ×2 IMPLANT
DRAIN CHANNEL 28F RND 3/8 FF (WOUND CARE) IMPLANT
DRAIN CHANNEL 32F RND 10.7 FF (WOUND CARE) IMPLANT
DRAPE LAPAROSCOPIC ABDOMINAL (DRAPES) ×3 IMPLANT
DRAPE SLUSH/WARMER DISC (DRAPES) IMPLANT
ELECT BLADE 6.5 EXT (BLADE) ×2 IMPLANT
ELECT REM PT RETURN 9FT ADLT (ELECTROSURGICAL) ×3
ELECTRODE REM PT RTRN 9FT ADLT (ELECTROSURGICAL) ×1 IMPLANT
GAUZE SPONGE 4X4 12PLY STRL (GAUZE/BANDAGES/DRESSINGS) ×6 IMPLANT
GLOVE BIOGEL M 7.0 STRL (GLOVE) ×8 IMPLANT
GLOVE SURG SIGNA 7.5 PF LTX (GLOVE) ×6 IMPLANT
GOWN STRL REUS W/ TWL LRG LVL3 (GOWN DISPOSABLE) ×1 IMPLANT
GOWN STRL REUS W/ TWL XL LVL3 (GOWN DISPOSABLE) ×1 IMPLANT
GOWN STRL REUS W/TWL LRG LVL3 (GOWN DISPOSABLE) ×6
GOWN STRL REUS W/TWL XL LVL3 (GOWN DISPOSABLE) ×3
HEMOSTAT SURGICEL 2X14 (HEMOSTASIS) ×2 IMPLANT
KIT BASIN OR (CUSTOM PROCEDURE TRAY) ×3 IMPLANT
KIT ROOM TURNOVER OR (KITS) ×3 IMPLANT
KIT SUCTION CATH 14FR (SUCTIONS) IMPLANT
NS IRRIG 1000ML POUR BTL (IV SOLUTION) ×6 IMPLANT
PACK CHEST (CUSTOM PROCEDURE TRAY) ×1 IMPLANT
PACK GENERAL/GYN (CUSTOM PROCEDURE TRAY) ×2 IMPLANT
PAD ARMBOARD 7.5X6 YLW CONV (MISCELLANEOUS) ×6 IMPLANT
PAD ELECT DEFIB RADIOL ZOLL (MISCELLANEOUS) ×3 IMPLANT
SPONGE GAUZE 4X4 12PLY STER LF (GAUZE/BANDAGES/DRESSINGS) ×2 IMPLANT
SPONGE INTESTINAL PEANUT (DISPOSABLE) ×2 IMPLANT
STAPLER VISISTAT 35W (STAPLE) IMPLANT
SUT SILK  1 MH (SUTURE) ×4
SUT SILK 1 MH (SUTURE) ×1 IMPLANT
SUT VIC AB 1 CTX 36 (SUTURE) ×6
SUT VIC AB 1 CTX36XBRD ANBCTR (SUTURE) ×1 IMPLANT
SUT VIC AB 2-0 CTX 36 (SUTURE) ×3 IMPLANT
SUT VIC AB 3-0 X1 27 (SUTURE) ×3 IMPLANT
SWAB COLLECTION DEVICE MRSA (MISCELLANEOUS) IMPLANT
SYSTEM SAHARA CHEST DRAIN ATS (WOUND CARE) ×1 IMPLANT
TAPE CLOTH SURG 4X10 WHT LF (GAUZE/BANDAGES/DRESSINGS) ×2 IMPLANT
TOWEL OR 17X24 6PK STRL BLUE (TOWEL DISPOSABLE) ×3 IMPLANT
TOWEL OR 17X26 10 PK STRL BLUE (TOWEL DISPOSABLE) ×6 IMPLANT
TRAP SPECIMEN MUCOUS 40CC (MISCELLANEOUS) ×8 IMPLANT
TRAY FOLEY CATH 16FRSI W/METER (SET/KITS/TRAYS/PACK) ×3 IMPLANT
TUBE ANAEROBIC SPECIMEN COL (MISCELLANEOUS) IMPLANT
WATER STERILE IRR 1000ML POUR (IV SOLUTION) ×6 IMPLANT

## 2016-04-21 NOTE — Progress Notes (Addendum)
Patient requests door be closed at all times, to decrease external stimuli.  Pt resting comfortably at the time; he denies worsening of sx but reports minimal residual discomforts.

## 2016-04-21 NOTE — H&P (View-Only) (Signed)
      [address]       [place] [address]             [phone]      Does not feel as well today. Tired and subscapular back pain- has eased off a ittle with meds  BP 106/65 (BP Location: Right Arm)   Pulse 79   Temp 97.8 F (36.6 C) (Oral)   Resp 18   Ht 5\' 8"  (1.727 m)   Wt 215 lb 9.6 oz (97.8 kg)   SpO2 99%   BMI 32.78 kg/m    Intake/Output Summary (Last 24 hours) at 04/20/16 1400 Last data filed at 04/20/16 1054  Gross per 24 hour  Intake             1338 ml  Output             1880 ml  Net             -542 ml   Exam: BP 106/65 (BP Location: Right Arm)   Pulse 79   Temp 97.8 F (36.6 C) (Oral)   Resp 18   Ht 5\' 8"  (1.727 m)   Wt 215 lb 9.6 oz (97.8 kg)   SpO2 99%   BMI 32.78 kg/m  Alert and oriented x 3 Sternal incision c,d,i Lungs improved BS left base Cardiac slightly irregular, no rub  Repeat Echo Study Conclusions  - Left ventricle: The cavity size was normal. There was mild   concentric hypertrophy. Systolic function was normal. The   estimated ejection fraction was in the range of 50% to 55%. Wall   motion was normal; there were no regional wall motion   abnormalities. - Ventricular septum: Septal motion showed paradox. - Aortic valve: A bioprosthesis was present and functioning   normally. Valve area (VTI): 2.19 cm^2. Valve area (Vmax): 2.15   cm^2. Valve area (Vmean): 2.05 cm^2. - Left atrium: The atrium was severely dilated. - Pericardium, extracardiac: A large pericardial effusion was   identified posterior to the heart and circumferential to the   heart. The fluid contained focal strands. There was mildright   ventricular chamber collapse for less than 50% of the cardiac   cycle. Now in atrial fibrillation, hard to evaluate for   respiratory flow variation. Features were probably consistent   with mild tamponade physiology. There was a left pleural   effusion.  IMPRESSION: Postpericardiotomy syndrome post AVR,  CABG  His pericardial effusion has gotten a little larger and is now showing some early signs of potential tamponade. He is not in any distress at this point. I think the best option is to take him to the OR tomorrow and do a subxiphoid pericardial window.   I informed him of the indications, risks, benefits and alternatives. He understands the risks include but are not limited to death, MI, DVT, PE, cardiac injury, bleeding, possible need for transfusion, infection, as well as the possibility of other unforeseeable complications. He accepts the risks and agrees to proceed.  Will stop heparin drip at 2 AM prior to surgery  I discussed this plan with Dr. Angela Cox C. Roxan Hockey, MD [facility] [phone]

## 2016-04-21 NOTE — Progress Notes (Signed)
Page to Dr [name] regarding the chest discomfort that woke pt from sleep. MD informed of no change to vital signs, no change to telemetry. Updated of pt procedure time of 1400, MD agrees pain is likely result of fluiid/pericarditis that pt is being treated for; agrees with no further CP Protocol/Interventions necessary.   Per patient, pain has "leveled off" though will spike with movement.

## 2016-04-21 NOTE — Anesthesia Procedure Notes (Signed)
Procedure Name: Intubation Date/Time: 04/21/2016 4:31 PM Performed by: Eligha Bridegroom Pre-anesthesia Checklist: Patient identified, Emergency Drugs available, Suction available, Patient being monitored and Timeout performed Patient Re-evaluated:Patient Re-evaluated prior to inductionOxygen Delivery Method: Circle system utilized Preoxygenation: Pre-oxygenation with 100% oxygen Intubation Type: IV induction Ventilation: Mask ventilation without difficulty Laryngoscope Size: Miller and 4 Grade View: Grade II Tube type: Oral Tube size: 7.5 mm Placement Confirmation: ETT inserted through vocal cords under direct vision,  positive ETCO2 and breath sounds checked- equal and bilateral Secured at: 21 cm Tube secured with: Tape Dental Injury: Teeth and Oropharynx as per pre-operative assessment

## 2016-04-21 NOTE — Progress Notes (Addendum)
Inpatient Diabetes Program Recommendations  AACE/ADA: New Consensus Statement on Inpatient Glycemic Control (2015)  Target Ranges:  Prepandial:   less than 140 mg/dL      Peak postprandial:   less than 180 mg/dL (1-2 hours)      Critically ill patients:  140 - 180 mg/dL   Lab Results  Component Value Date   GLUCAP 255 (H) 04/21/2016   HGBA1C 7.1 (H) 04/16/2016   Results for KEDWIN, WHITECOTTON (MRN [id number]) as of 04/21/2016 08:54  Ref. Range 04/20/2016 07:42 04/20/2016 11:32 04/20/2016 17:16 04/20/2016 20:58 04/21/2016 07:54  Glucose-Capillary Latest Ref Range: 65 - 99 mg/dL 297 (H) 227 (H) 325 (H) 288 (H) 255 (H)   Review of Glycemic Control  Diabetes history:     DM2, Obesity, Steroids Outpatient Diabetes medications:     Glipizide 10 mg daily, Metformin 1000 mg BID Current orders for Inpatient glycemic control:     Novolog 0-15 units TIDAC, NPO  Inpatient Diabetes Program Recommendations:     Noted that CBG's continue to be high.       Please consider Lantus 10 units daily when NPO.     Noted that patient is NPO.     Please consider changing frequency of moderate correction scale to Novolog 0-15 units Q4H.  Thank you,  Windy Carina, RN, MSN Diabetes Coordinator Inpatient Diabetes Program [phone] (Team Pager)

## 2016-04-21 NOTE — Progress Notes (Signed)
Pt gone to surgery. Second CHG bath done prior to departure. Vancomycin hung prior to departure. Report given to surgical staff. Pt's belongings remain in the room, will be relocated to his new assigned room once that information is available.

## 2016-04-21 NOTE — Progress Notes (Signed)
Progress Note  Patient Name: Mark Cabrera Date of Encounter: 04/21/2016  Primary Cardiologist: Dr. [name]  Subjective   Pt states his pain is much improved from the previous 2 days; he rates his pain 2/10 this morning. No SOB while supine.  Plan for OR today with CTS for subxiphoid pericardial window.   Inpatient Medications    Scheduled Meds: . sodium chloride   Intravenous Once  . amiodarone  200 mg Oral BID PC  . aspirin  325 mg Oral Daily  . carvedilol  3.125 mg Oral BID WC  . colchicine  0.6 mg Oral BID  . furosemide  40 mg Intravenous Q8H  . glipiZIDE  10 mg Oral QAC breakfast  . Influenza vac split quadrivalent PF  0.5 mL Intramuscular Tomorrow-1000  . insulin aspart  0-15 Units Subcutaneous TID WC  . pneumococcal 23 valent vaccine  0.5 mL Intramuscular Tomorrow-1000  . potassium chloride  40 mEq Oral BID  . predniSONE  10 mg Oral 4X daily taper  . sodium chloride flush  3 mL Intravenous Q12H  . tamsulosin  0.4 mg Oral QPC supper  . vancomycin  1,000 mg Intravenous To SS-Surg   Continuous Infusions:  PRN Meds: sodium chloride, acetaminophen, fentaNYL (SUBLIMAZE) injection, magnesium hydroxide, nitroGLYCERIN, ondansetron (ZOFRAN) IV, oxyCODONE, sodium chloride flush   Vital Signs    Vitals:   04/20/16 0531 04/20/16 1435 04/20/16 2100 04/21/16 0531  BP: 106/65 (!) 99/49 (!) 118/57 137/76  Pulse: 79 79 85 62  Resp: 18  18 18   Temp: 97.8 F (36.6 C) 98.3 F (36.8 C) 98.1 F (36.7 C) 98 F (36.7 C)  TempSrc: Oral Oral Oral Oral  SpO2: 99%  100% 100%  Weight: 215 lb 9.6 oz (97.8 kg)   213 lb 3.2 oz (96.7 kg)  Height:        Intake/Output Summary (Last 24 hours) at 04/21/16 0741 Last data filed at 04/21/16 0700  Gross per 24 hour  Intake            813.5 ml  Output             2330 ml  Net          -1516.5 ml   Filed Weights   04/19/16 0624 04/20/16 0531 04/21/16 0531  Weight: 218 lb 3.2 oz (99 kg) 215 lb 9.6 oz (97.8 kg) 213 lb 3.2 oz (96.7 kg)      Physical Exam   General: Well developed, well nourished, male appearing in no acute distress. Head: Normocephalic, atraumatic.  Neck: Supple without bruits, no JVD Lungs:  Resp regular and unlabored, CTA. Heart: Irregular rhythm, regular rate, S1, S2, 3/6 holosystolic murmur Abdomen: Soft, non-tender, non-distended with normoactive bowel sounds. No hepatomegaly. No rebound/guarding. No obvious abdominal masses. Extremities: No clubbing, cyanosis, Trace to 1+ B LE edema. Distal pedal pulses are 2+ bilaterally. Neuro: Alert and oriented X 3. Moves all extremities spontaneously. Psych: Normal affect.  Labs    Chemistry Recent Labs Lab 04/19/16 0531 04/20/16 2331 04/21/16 0339  NA 133* 132* 133*  K 4.3 4.2 4.4  CL 96* 96* 96*  CO2 25 24 25   GLUCOSE 237* 370* 309*  BUN 20 20 21*  CREATININE 1.30* 1.45* 1.22  CALCIUM 8.8* 8.9 8.6*  PROT  --  6.6  --   ALBUMIN  --  3.1*  --   AST  --  25  --   ALT  --  20  --   ALKPHOS  --  90  --   BILITOT  --  0.5  --   GFRNONAA 55* 48* 60*  GFRAA >60 56* >60  ANIONGAP 12 12 12      Hematology Recent Labs Lab 04/20/16 0505 04/20/16 2331 04/21/16 0339  WBC 7.6 7.8 8.2  RBC 3.30* 3.35* 3.25*  HGB 8.6* 8.9* 8.5*  HCT 27.2* 27.9* 27.1*  MCV 82.4 83.3 83.4  MCH 26.1 26.6 26.2  MCHC 31.6 31.9 31.4  RDW 14.7 15.3 15.1  PLT 254 261 267    Cardiac Enzymes Recent Labs Lab 04/18/16 2045  TROPONINI 0.15*    Recent Labs Lab 04/16/16 1715  TROPIPOC 0.09*     BNP Recent Labs Lab 04/16/16 1710  BNP 612.8*     DDimer No results for input(s): DDIMER in the last 168 hours.   Radiology    Dg Chest 2 View  Result Date: 04/20/2016 CLINICAL DATA:  Shortness of breath, underwent CABG, aortic valve replacement, and left atrial appendage clipping on March 30, 2016. EXAM: CHEST  2 VIEW COMPARISON:  Portable chest x-ray of April 18, 2016 FINDINGS: The right lung is well-expanded and clear. The left hemidiaphragm remains  obscured. There is slight improved aeration at the left lung base. The cardiac silhouette remains enlarged. The pulmonary vascularity is not engorged. The sternal wires are intact. The left atrial appendage clip and the aortic valve ring appear to be in stable position. The observed bony structures are unremarkable. IMPRESSION: Slightly decreased volume of the left pleural effusion and left basilar atelectasis. Stable cardiomegaly without pulmonary edema. Electronically Signed   By: David  Martinique M.D.   On: 04/20/2016 14:08     Telemetry    Rate controlled Afib with one episode of NSVT overnight - Personally Reviewed  ECG    No new tracings   Cardiac Studies   Echocardiogram 04/20/16: Study Conclusions - Left ventricle: The cavity size was normal. There was mild   concentric hypertrophy. Systolic function was normal. The   estimated ejection fraction was in the range of 50% to 55%. Wall   motion was normal; there were no regional wall motion   abnormalities. - Ventricular septum: Septal motion showed paradox. - Aortic valve: A bioprosthesis was present and functioning   normally. Valve area (VTI): 2.19 cm^2. Valve area (Vmax): 2.15   cm^2. Valve area (Vmean): 2.05 cm^2. - Left atrium: The atrium was severely dilated. - Pericardium, extracardiac: A large pericardial effusion was   identified posterior to the heart and circumferential to the   heart. The fluid contained focal strands. There was mildright   ventricular chamber collapse for less than 50% of the cardiac   cycle. Now in atrial fibrillation, hard to evaluate for   respiratory flow variation. Features were probably consistent   with mild tamponade physiology. There was a left pleural   effusion.  Patient Profile     66 y.o. 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 Roxan Hockey. He did have atrial fib post-op but was in  sinus at time of discharge. He is now admitted with volume overload and atrial fibrillation.  Assessment & Plan    1. Acute on chronic diastolic CHF:  - Good diuresis overnight. Negative 2.3liters last 24 hours and is net negative 4 L; weight is down from 226 lb>>213 lb - sCr improved 1.22 (1.45), continue diuresis - Hypervolemia is resolving but still with bilateral pretibial  pitting edema.  Will continue IV Lasix today and follow renal function closely.  - continue to trend BMP   2. Atrial fibrillation,paroxysmal:  - telemetry review with rate controlled Afib with one bout of NSVT, pt asymptomatic - check a magnesium level (ordered as add-on) - continue po amiodarone and coreg  - Will continue IV heparin today. Will plan long term anti-coagulation. This will need to be coumadin for now. Will start after all invasive procedures performed. Follow H/H.    3. Anemia: - Post-op anemia - Transfused 1unit of pRBCs 04/18/16 - H/H: 8.5 / 27.1   4. Left pleural effusion: - Discussed with Dr. Roxan Hockey. He is post-op within last 3 weeks from CABG and AVR.  - IR performed US thoracentesis 04/18/16 with 1.4L removed - pleural fluid NGTD   5. Hypokalemia: - resolved   6. Pericardial effusion: - Echo 04/20/16 with large pericardial effusion; AVR functioning normally  - to OR today with CTS for subxiphoid pericardial window - continue prednisone and colchicine   7. CAD s/p CABG: SVG to OM1 and SVG to PDA. Stable w/o angina   8. Aortic valve disease: s/p AVR. Functioning well by echo 04/17/16   9. Pericarditis:   -in the setting of recent pericardiotomy.  - pleuritic pain is improved; continue BID colchicine   Signed, Minette Brine , PA-C 7:41 AM 04/21/2016 Pager: [phone]  I have personally seen and examined this patient with Doreene Adas, PA-C. I agree with the assessment and plan as outlined above. He is doing well today. He is in rate controlled atrial fib this  am. He is on heparin. Pericardial effusion has increased in size and now with early signs of tamponade. Plans for Dr. Roxan Hockey to take to the OR today for pericardial window. Left pleural effusion improved post thoracentesis. He appears close to euvolemia. Will plan Lasix post OR today but may be able to be switched to po Lasix tomorrow. Continue prednisone taper and colchicine for post-pericardiotomy inflammation.   Lauree Chandler 04/21/2016 9:03 AM

## 2016-04-21 NOTE — Op Note (Signed)
NAMEMarland Kitchen  AZARIA, ORLANDINI NO.:  [id number]  MEDICAL RECORD NO.:  [id number]  LOCATION:  A04C                         FACILITY:  [facility]  PHYSICIAN:  Revonda Standard. Roxan Hockey, M.D.DATE OF BIRTH:  04/24/1950  DATE OF PROCEDURE:  04/21/2016 DATE OF DISCHARGE:                              OPERATIVE REPORT   PREOPERATIVE DIAGNOSIS:  Pericardial effusion.  POSTOPERATIVE DIAGNOSIS:  Pericardial effusion.  PROCEDURE:  Subxiphoid pericardial window.  SURGEON:  Revonda Standard. Roxan Hockey, MD.  ASSISTANTEllwood Handler, PA.  ANESTHESIA:  General.  FINDINGS:  500 mL of fluid evacuated from pericardial space.  Minimal hemodynamic changes.  No significant residual pericardial effusion on echocardiogram.  CLINICAL NOTE:  Mr. Cerasuolo is a 66 year old gentleman, who recently had undergone aortic valve replacement and coronary bypass grafting.  He presented with rapid atrial fibrillation and shortness of breath.  He had a large pericardial effusion and a large left pleural effusion. Findings were consistent with postpericardiotomy syndrome.  He had thoracentesis of the left pleural effusion.  A repeat echocardiogram showed slight increase in the size of the pericardial effusion with some early signs of cardiac tamponade.  Although the patient was not in cardiac tamponade clinically, he was advised to undergo subxiphoid pericardial window to drain the fluid.  The indications, risks, benefits, and alternatives were discussed in detail with the patient. He understood and accepted the risks and agreed to proceed.  DESCRIPTION OF PROCEDURE:  Mr. Age was brought to the operating room on April 21, 2016.  He was anesthetized and intubated.  An arterial blood pressure monitoring line was in place and he tolerated intubation without significant change in blood pressure.  A Foley catheter was Placed. Intravenous antibiotics were administered.  The chest and abdomen were prepped and draped in  usual sterile fashion.  An incision was made centered over the xiphoid process and incorporating the lower portion of the previous sternotomy incision.  This was carried through the skin and subcutaneous tissue.  Hemostasis was achieved with cautery. Dissection was carried down to the xiphoid process and the rectus sheath was dissected off the xiphoid process.  The pericardial space was entered and 500 mL of serous fluid was evacuated.  The Yankauer suction was placed into the pericardium and moved around to break up any loculations.  The majority of the fluid was along the diaphragm and posterior to the left ventricle.  Using gentle fingertip dissection, no space could be entered near the right atrium or anterior to the right ventricle.  There was some minor epicardial bleeding from the right ventricle.  Surgicel was applied and gentle pressure was held for several minutes and the bleeding stopped.  After confirming removal of the majority of the fluid with transesophageal echocardiography, a 46- [place] Blake drain was placed through a separate stab incision and tunneled into the pericardial space along the diaphragmatic surface.  It was secured to skin with #1 silk suture.  The wound was closed in standard fashion in 3 layers with a running #1 Vicryl fascial suture followed by 2-0 Vicryl subcutaneous suture and a 3-0 Vicryl subcuticular suture.  All sponge, needle, and instrument counts were correct at the end  of the procedure. The patient tolerated the procedure well and was taken from the operating room to the postanesthetic care unit in good condition.     Revonda Standard Roxan Hockey, M.D.     SCH/MEDQ  D:  04/21/2016  T:  04/21/2016  Job:  [id number]

## 2016-04-21 NOTE — Progress Notes (Signed)
Sentinel for heparin  Indication: atrial fibrillation  Allergies  Allergen Reactions  . Crab (Diagnostic) Anaphylaxis  . Penicillins Other (See Comments)    UNSPECIFIED REACTIONS  Has patient had a PCN reaction causing immediate rash, facial/tongue/throat swelling, SOB or lightheadedness with hypotension: Unk Has patient had a PCN reaction causing severe rash involving mucus membranes or skin necrosis: Unk Has patient had a PCN reaction that required hospitalization: Unk Has patient had a PCN reaction occurring within the last 10 years: No If all of the above answers are "NO", then may proceed with Cephalosporin use.   . Statins     UNSPECIFIED REACTION   . Morphine And Related Other (See Comments)    hallucinations    Patient Measurements: Height: 5\' 8"  (172.7 cm) Weight: 213 lb 3.2 oz (96.7 kg) (scale a) IBW/kg (Calculated) : 68.4 Heparin Dosing Weight: 90kg  Vital Signs: Temp: 98.1 F (36.7 C) (02/09 1945) Temp Source: Oral (02/09 1133) BP: 138/71 (02/09 2026) Pulse Rate: 77 (02/09 2026)  Labs:  Recent Labs  04/19/16 0531 04/20/16 0505 04/20/16 2331 04/21/16 0339  HGB 9.0* 8.6* 8.9* 8.5*  HCT 27.8* 27.2* 27.9* 27.1*  PLT 272 254 261 267  LABPROT  --   --  14.7  --   INR  --   --  1.14  --   HEPARINUNFRC 0.41 0.42  --   --   CREATININE 1.30*  --  1.45* 1.22    Estimated Creatinine Clearance: 66.2 mL/min (by C-G formula based on SCr of 1.22 mg/dL).  Assessment: 66 yo male was on IV heparin for afib / AVR prior to surgery today for pericardial effusion. Post op, Heparin consult order from TCTS received TO RESUME HEPARIN IN AM 04/22/16.  S/p pericardial window.  HL previously therapeutic on hep 1450 units/hr.   CBC low/ stable with Hgb 8.5 , pltc wnl. AET 2/9 17:43   Goal of Therapy:  Heparin level 0.3-0.7 units/ml Monitor platelets by anticoagulation protocol: Yes   Plan:   Resume Heparin 1450/hr at 6 AM 04/22/16 (12  hours post op) Check heparin level in 6 hrs Daily heparin level and CBC Follow up AM labs  Thank you Nicole Cella, RPh Clinical Pharmacist Pager: [phone] 04/21/2016 10:39 PM

## 2016-04-21 NOTE — Brief Op Note (Addendum)
04/16/2016 - 04/21/2016  5:24 PM  PATIENT:  Mark Cabrera  66 y.o. male  PRE-OPERATIVE DIAGNOSIS:  PERICARDIAL EFFUSION  POST-OPERATIVE DIAGNOSIS:  PERICARDIAL EFFUSION  PROCEDURE:  Procedure(s):  SUBXYPHOID PERICARDIAL WINDOW (N/A) -500 cc Serous Fluid TRANSESOPHAGEAL ECHOCARDIOGRAM (TEE) (N/A)  SURGEON:  Surgeon(s) and Role:    * [name], MD - Primary  PHYSICIAN ASSISTANT: Erin Barrett PA-C  ANESTHESIA:   general  EBL:  Total I/O In: 0  Out: 300 [Urine:300]  BLOOD ADMINISTERED:none  DRAINS: 19 Blake Drain Pericardium   LOCAL MEDICATIONS USED:  NONE  SPECIMEN:  No Specimen  DISPOSITION OF SPECIMEN:  N/A  COUNTS:  YES   PLAN OF CARE: Admit to inpatient   PATIENT DISPOSITION:  PACU - hemodynamically stable.   Delay start of Pharmacological VTE agent (>24hrs) due to surgical blood loss or risk of bleeding: yes  FINDINGS: 500 ml of serous fluid evacuated

## 2016-04-21 NOTE — Progress Notes (Signed)
Patient c/o throbbing mid-sternal pain, pt denies radiating pain down the arms, or up the jaw. Pts vitals are WNL, he is SR/Afib on the monitor.   Pt reports this pain as identical to the pain he had the other day; stating nitro he received at that time did not resolve the sx.   EKG performed per CP protocol.

## 2016-04-21 NOTE — Transfer of Care (Signed)
Immediate Anesthesia Transfer of Care Note  Patient: Mark Cabrera  Procedure(s) Performed: Procedure(s): SUBXYPHOID PERICARDIAL WINDOW (N/A) TRANSESOPHAGEAL ECHOCARDIOGRAM (TEE) (N/A)  Patient Location: PACU  Anesthesia Type:General  Level of Consciousness: awake, alert  and oriented  Airway & Oxygen Therapy: Patient Spontanous Breathing and Patient connected to nasal cannula oxygen  Post-op Assessment: Report given to RN and Post -op Vital signs reviewed and stable  Post vital signs: Reviewed and stable  Last Vitals:  Vitals:   04/21/16 0826 04/21/16 1133  BP: 118/68 136/78  Pulse: 70 76  Resp:  18  Temp:  36.5 C    Last Pain:  Vitals:   04/21/16 1133  TempSrc: Oral  PainSc:       Patients Stated Pain Goal: 0 ([id number])  Complications: No apparent anesthesia complications

## 2016-04-21 NOTE — Progress Notes (Signed)
  Echocardiogram Echocardiogram Transesophageal has been performed.  Mark Cabrera, Mark Cabrera 04/21/2016, 4:49 PM

## 2016-04-21 NOTE — Progress Notes (Signed)
Verified order on Heparin infusion with Dr. Roxan Hockey, Pharmacy consult to resume Heparin gtt in am.

## 2016-04-21 NOTE — Interval H&P Note (Signed)
History and Physical Interval Note:  04/21/2016 3:20 PM  Mark Cabrera  has presented today for surgery, with the diagnosis of PERICARDIAL EFFUSION  The various methods of treatment have been discussed with the patient and family. After consideration of risks, benefits and other options for treatment, the patient has consented to  Procedure(s): SUBXYPHOID PERICARDIAL WINDOW (N/A) TRANSESOPHAGEAL ECHOCARDIOGRAM (TEE) (N/A) as a surgical intervention .  The patient's history has been reviewed, patient examined, no change in status, stable for surgery.  I have reviewed the patient's chart and labs.  Questions were answered to the patient's satisfaction.     [name]

## 2016-04-21 NOTE — Progress Notes (Signed)
Pt is in NPO for the procedure, Consent has been taken, CHG bath is given, Heparin IV stopped @2am , pt ambulated in a hallway during night, no any specific complain, will continue to monitor the patient.

## 2016-04-21 NOTE — Anesthesia Preprocedure Evaluation (Signed)
Anesthesia Evaluation  Patient identified by MRN, date of birth, ID band Patient awake    Reviewed: Allergy & Precautions, NPO status , Patient's Chart, lab work & pertinent test results  Airway Mallampati: II  TM Distance: >3 FB Neck ROM: Full    Dental no notable dental hx.    Pulmonary neg pulmonary ROS,    Pulmonary exam normal breath sounds clear to auscultation       Cardiovascular hypertension, + CAD, + CABG and + Peripheral Vascular Disease  + Valvular Problems/Murmurs AS  Rhythm:Irregular Rate:Normal  Left ventricle: The cavity size was normal. There was mild   concentric hypertrophy. Systolic function was normal. The   estimated ejection fraction was in the range of 50% to 55%. Wall   motion was normal; there were no regional wall motion   abnormalities. - Ventricular septum: Septal motion showed paradox. - Aortic valve: A bioprosthesis was present and functioning   normally. Valve area (VTI): 2.19 cm^2. Valve area (Vmax): 2.15   cm^2. Valve area (Vmean): 2.05 cm^2. - Left atrium: The atrium was severely dilated. - Pericardium, extracardiac: A large pericardial effusion was   identified posterior to the heart and circumferential to the   heart. The fluid contained focal strands. There was mildright   ventricular chamber collapse for less than 50% of the cardiac   cycle. Now in atrial fibrillation, hard to evaluate for   respiratory flow variation. Features were probably consistent   with mild tamponade physiology. There was a left pleural   Effusion. 03/20/16   Neuro/Psych negative neurological ROS  negative psych ROS   GI/Hepatic negative GI ROS, Neg liver ROS,   Endo/Other  diabetes  Renal/GU negative Renal ROS  negative genitourinary   Musculoskeletal negative musculoskeletal ROS (+)   Abdominal   Peds negative pediatric ROS (+)  Hematology negative hematology ROS (+)   Anesthesia Other  Findings   Reproductive/Obstetrics negative OB ROS                             Anesthesia Physical Anesthesia Plan  ASA: III  Anesthesia Plan: General   Post-op Pain Management:    Induction: Intravenous  Airway Management Planned: Oral ETT  Additional Equipment: TEE  Intra-op Plan:   Post-operative Plan: Possible Post-op intubation/ventilation  Informed Consent: I have reviewed the patients History and Physical, chart, labs and discussed the procedure including the risks, benefits and alternatives for the proposed anesthesia with the patient or authorized representative who has indicated his/her understanding and acceptance.   Dental advisory given  Plan Discussed with: CRNA and Surgeon  Anesthesia Plan Comments:         Anesthesia Quick Evaluation

## 2016-04-21 NOTE — Anesthesia Postprocedure Evaluation (Addendum)
Anesthesia Post Note  Patient: DORLAN BRAMLETTE  Procedure(s) Performed: Procedure(s) (LRB): SUBXYPHOID PERICARDIAL WINDOW (N/A) TRANSESOPHAGEAL ECHOCARDIOGRAM (TEE) (N/A)  Patient location during evaluation: PACU Anesthesia Type: General Level of consciousness: awake and alert Pain management: pain level controlled Vital Signs Assessment: post-procedure vital signs reviewed and stable Respiratory status: spontaneous breathing, nonlabored ventilation, respiratory function stable and patient connected to nasal cannula oxygen Cardiovascular status: blood pressure returned to baseline and stable Postop Assessment: no signs of nausea or vomiting Anesthetic complications: no       Last Vitals:  Vitals:   04/21/16 1133 04/21/16 1742  BP: 136/78 (!) 169/95  Pulse: 76 90  Resp: 18 19  Temp: 36.5 C 36.1 C    Last Pain:  Vitals:   04/21/16 1742  TempSrc:   PainSc: Asleep                 Saydee Zolman S

## 2016-04-22 ENCOUNTER — Inpatient Hospital Stay (HOSPITAL_COMMUNITY): Payer: Medicare Other

## 2016-04-22 ENCOUNTER — Encounter (HOSPITAL_COMMUNITY): Payer: Self-pay | Admitting: Thoracic Surgery (Cardiothoracic Vascular Surgery)

## 2016-04-22 DIAGNOSIS — Z951 Presence of aortocoronary bypass graft: Secondary | ICD-10-CM

## 2016-04-22 LAB — GLUCOSE, CAPILLARY
GLUCOSE-CAPILLARY: 219 mg/dL — AB (ref 65–99)
GLUCOSE-CAPILLARY: 184 mg/dL — AB (ref 65–99)
Glucose-Capillary: 209 mg/dL — ABNORMAL HIGH (ref 65–99)
Glucose-Capillary: 265 mg/dL — ABNORMAL HIGH (ref 65–99)

## 2016-04-22 LAB — BLOOD GAS, ARTERIAL
Acid-Base Excess: 3.1 mmol/L — ABNORMAL HIGH (ref 0.0–2.0)
Bicarbonate: 26.8 mmol/L (ref 20.0–28.0)
DRAWN BY: 437071
FIO2: 21
O2 SAT: 94.7 %
PATIENT TEMPERATURE: 97.7
pCO2 arterial: 37.5 mmHg (ref 32.0–48.0)
pH, Arterial: 7.465 — ABNORMAL HIGH (ref 7.350–7.450)
pO2, Arterial: 69.3 mmHg — ABNORMAL LOW (ref 83.0–108.0)

## 2016-04-22 LAB — CBC
HCT: 30.3 % — ABNORMAL LOW (ref 39.0–52.0)
Hemoglobin: 9.4 g/dL — ABNORMAL LOW (ref 13.0–17.0)
MCH: 25.8 pg — AB (ref 26.0–34.0)
MCHC: 31 g/dL (ref 30.0–36.0)
MCV: 83 fL (ref 78.0–100.0)
PLATELETS: 293 10*3/uL (ref 150–400)
RBC: 3.65 MIL/uL — ABNORMAL LOW (ref 4.22–5.81)
RDW: 15.1 % (ref 11.5–15.5)
WBC: 8.8 10*3/uL (ref 4.0–10.5)

## 2016-04-22 LAB — BASIC METABOLIC PANEL
ANION GAP: 11 (ref 5–15)
BUN: 19 mg/dL (ref 6–20)
CALCIUM: 8.5 mg/dL — AB (ref 8.9–10.3)
CO2: 26 mmol/L (ref 22–32)
Chloride: 98 mmol/L — ABNORMAL LOW (ref 101–111)
Creatinine, Ser: 1.01 mg/dL (ref 0.61–1.24)
GFR calc Af Amer: >60 mL/min (ref >60)
Glucose, Bld: 193 mg/dL — ABNORMAL HIGH (ref 65–99)
POTASSIUM: 4.4 mmol/L (ref 3.5–5.1)
SODIUM: 135 mmol/L (ref 135–145)

## 2016-04-22 LAB — HEPARIN LEVEL (UNFRACTIONATED)
Heparin Unfractionated: 1.6 IU/mL — ABNORMAL HIGH (ref 0.30–0.70)
Heparin Unfractionated: 0.76 IU/mL — ABNORMAL HIGH (ref 0.30–0.70)

## 2016-04-22 LAB — BODY FLUID CULTURE
CULTURE: NO GROWTH
SPECIAL REQUESTS: NORMAL

## 2016-04-22 MED ORDER — METFORMIN HCL ER 500 MG PO TB24
1000.0000 mg | ORAL_TABLET | Freq: Two times a day (BID) | ORAL | Status: DC
  Administered 2016-04-22 – 2016-04-28 (×14): 1000 mg via ORAL
  Filled 2016-04-22 (×16): qty 2

## 2016-04-22 NOTE — Progress Notes (Signed)
1 Day Post-Op Procedure(s) (LRB): SUBXYPHOID PERICARDIAL WINDOW (N/A) TRANSESOPHAGEAL ECHOCARDIOGRAM (TEE) (N/A) Subjective: Wants Foley removed  Objective: Vital signs in last 24 hours: Temp:  [97 F (36.1 C)-98.8 F (37.1 C)] 98.2 F (36.8 C) (02/10 0703) Pulse Rate:  [75-90] 81 (02/10 0703) Cardiac Rhythm: Atrial fibrillation (02/10 0800) Resp:  [13-19] 14 (02/10 0703) BP: (120-173)/(71-95) 133/72 (02/10 0703) SpO2:  [95 %-99 %] 99 % (02/10 0703) Arterial Line BP: (206)/(88) 206/88 (02/09 1742) Weight:  [215 lb 9.8 oz (97.8 kg)-223 lb 8.7 oz (101.4 kg)] 215 lb 9.8 oz (97.8 kg) (02/10 0311)  Hemodynamic parameters for last 24 hours:    Intake/Output from previous day: 02/09 0701 - 02/10 0700 In: 1252.8 [P.O.:360; I.V.:892.8] Out: 2540 [Urine:2500; Drains:40] Intake/Output this shift: No intake/output data recorded.  General appearance: alert, cooperative and no distress Neurologic: intact Heart: irregularly irregular rhythm Lungs: diminished breath sounds bibasilar and L>R serosanguinous drainage from drain  Lab Results:  Recent Labs  04/21/16 0339 04/22/16 0301  WBC 8.2 8.8  HGB 8.5* 9.4*  HCT 27.1* 30.3*  PLT 267 293   BMET:  Recent Labs  04/21/16 0339 04/22/16 0301  NA 133* 135  K 4.4 4.4  CL 96* 98*  CO2 25 26  GLUCOSE 309* 193*  BUN 21* 19  CREATININE 1.22 1.01  CALCIUM 8.6* 8.5*    PT/INR:  Recent Labs  04/20/16 2331  LABPROT 14.7  INR 1.14   ABG    Component Value Date/Time   PHART 7.465 (H) 04/22/2016 0325   HCO3 26.8 04/22/2016 0325   TCO2 24 03/31/2016 1614   ACIDBASEDEF 3.0 (H) 03/30/2016 1850   O2SAT 94.7 04/22/2016 0325   CBG (last 3)   Recent Labs  04/21/16 1927 04/21/16 2153 04/22/16 0750  GLUCAP 249* 247* 219*    Assessment/Plan: S/P Procedure(s) (LRB): SUBXYPHOID PERICARDIAL WINDOW (N/A) TRANSESOPHAGEAL ECHOCARDIOGRAM (TEE) (N/A) -CV- still in atrial fibrillation   S/p window- 500 ml drained initially,  moderate drainage from pericardial drain  Heparin restarted, OK to proceed with coumadin from my standpoint- will defer to Cardiology  Continue prednisone and colchicine  RESP_ LLL atelectasis on CXR- IS  RENAL- creatinine normal  ENDO- CBG elevated,. Restart metformin  Ambulate  LOS: 6 days    [name] 04/22/2016

## 2016-04-22 NOTE — Progress Notes (Signed)
Progress Note  Patient Name: Mark Cabrera Date of Encounter: 04/22/2016  Primary Cardiologist: Dr. [name]  Subjective   Pt is s/p pericardial window.  Marland Kitchen Hx of AFib    Inpatient Medications    Scheduled Meds: . sodium chloride   Intravenous Once  . acetaminophen  1,000 mg Oral Q6H   Or  . acetaminophen (TYLENOL) oral liquid 160 mg/5 mL  1,000 mg Oral Q6H  . amiodarone  200 mg Oral BID PC  . aspirin  325 mg Oral Daily  . bisacodyl  10 mg Oral Daily  . carvedilol  3.125 mg Oral BID WC  . colchicine  0.6 mg Oral BID  . furosemide  40 mg Intravenous Q12H  . glipiZIDE  10 mg Oral QAC breakfast  . Influenza vac split quadrivalent PF  0.5 mL Intramuscular Tomorrow-1000  . insulin aspart  0-15 Units Subcutaneous TID WC  . metFORMIN  1,000 mg Oral BID WC  . pneumococcal 23 valent vaccine  0.5 mL Intramuscular Tomorrow-1000  . potassium chloride  20 mEq Oral BID  . predniSONE  10 mg Oral 4X daily taper  . senna-docusate  1 tablet Oral QHS  . sodium chloride flush  3 mL Intravenous Q12H  . tamsulosin  0.4 mg Oral QPC supper   Continuous Infusions: . heparin 1,450 Units/hr (04/22/16 0531)  . lactated ringers 10 mL/hr at 04/21/16 1514   PRN Meds: sodium chloride, acetaminophen, fentaNYL (SUBLIMAZE) injection, magnesium hydroxide, nitroGLYCERIN, ondansetron (ZOFRAN) IV, oxyCODONE, potassium chloride (KCL MULTIRUN) 30 mEq in 265 mL IVPB, sodium chloride flush, traMADol   Vital Signs    Vitals:   04/22/16 0311 04/22/16 0703 04/22/16 1132 04/22/16 1149  BP: (!) 148/75 133/72 122/72   Pulse: 85 81 (!) 45   Resp: 18 14 15    Temp: 97.7 F (36.5 C) 98.2 F (36.8 C)  97.6 F (36.4 C)  TempSrc: Oral Oral  Oral  SpO2: 95% 99% 100%   Weight: 215 lb 9.8 oz (97.8 kg)     Height:        Intake/Output Summary (Last 24 hours) at 04/22/16 1306 Last data filed at 04/22/16 1000  Gross per 24 hour  Intake          1252.83 ml  Output             2640 ml  Net         -1387.17 ml    Filed Weights   04/21/16 0531 04/21/16 2045 04/22/16 0311  Weight: 213 lb 3.2 oz (96.7 kg) 223 lb 8.7 oz (101.4 kg) 215 lb 9.8 oz (97.8 kg)     Physical Exam   General: Well developed, well nourished, male appearing in no acute distress. Head: Normocephalic, atraumatic.  Neck: Supple without bruits, no JVD Lungs:  Resp regular and unlabored, CTA. Heart: Irregular rhythm, regular rate, S1, S2, 3/6 holosystolic murmur Abdomen: Soft, non-tender, non-distended with normoactive bowel sounds. No hepatomegaly. No rebound/guarding. No obvious abdominal masses. Extremities: No clubbing, cyanosis, Trace to 1+ B LE edema. Distal pedal pulses are 2+ bilaterally. Neuro: Alert and oriented X 3. Moves all extremities spontaneously. Psych: Normal affect.  Labs    Chemistry  Recent Labs Lab 04/20/16 2331 04/21/16 0339 04/22/16 0301  NA 132* 133* 135  K 4.2 4.4 4.4  CL 96* 96* 98*  CO2 24 25 26   GLUCOSE 370* 309* 193*  BUN 20 21* 19  CREATININE 1.45* 1.22 1.01  CALCIUM 8.9 8.6* 8.5*  PROT 6.6  --   --  ALBUMIN 3.1*  --   --   AST 25  --   --   ALT 20  --   --   ALKPHOS 90  --   --   BILITOT 0.5  --   --   GFRNONAA 48* 60* >60  GFRAA 56* >60 >60  ANIONGAP 12 12 11      Hematology  Recent Labs Lab 04/20/16 2331 04/21/16 0339 04/22/16 0301  WBC 7.8 8.2 8.8  RBC 3.35* 3.25* 3.65*  HGB 8.9* 8.5* 9.4*  HCT 27.9* 27.1* 30.3*  MCV 83.3 83.4 83.0  MCH 26.6 26.2 25.8*  MCHC 31.9 31.4 31.0  RDW 15.3 15.1 15.1  PLT 261 267 293    Cardiac Enzymes  Recent Labs Lab 04/18/16 2045  TROPONINI 0.15*     Recent Labs Lab 04/16/16 1715  TROPIPOC 0.09*     BNP  Recent Labs Lab 04/16/16 1710  BNP 612.8*     DDimer No results for input(s): DDIMER in the last 168 hours.   Radiology    Dg Chest 2 View  Result Date: 04/20/2016 CLINICAL DATA:  Shortness of breath, underwent CABG, aortic valve replacement, and left atrial appendage clipping on March 30, 2016. EXAM:  CHEST  2 VIEW COMPARISON:  Portable chest x-ray of April 18, 2016 FINDINGS: The right lung is well-expanded and clear. The left hemidiaphragm remains obscured. There is slight improved aeration at the left lung base. The cardiac silhouette remains enlarged. The pulmonary vascularity is not engorged. The sternal wires are intact. The left atrial appendage clip and the aortic valve ring appear to be in stable position. The observed bony structures are unremarkable. IMPRESSION: Slightly decreased volume of the left pleural effusion and left basilar atelectasis. Stable cardiomegaly without pulmonary edema. Electronically Signed   By: David  Martinique M.D.   On: 04/20/2016 14:08   Dg Chest Port 1 View  Result Date: 04/22/2016 CLINICAL DATA:  Pericardial effusion EXAM: PORTABLE CHEST 1 VIEW COMPARISON:  04/21/2016 FINDINGS: Cardiomegaly. Prior valve replacement. Left lower lobe atelectasis or infiltrate with small left effusion. Aeration at the left base slightly worsened since prior study. Minimal right base atelectasis. IMPRESSION: Worsening aeration with increasing atelectasis or infiltrate at the left base. Small left effusion. Minimal right base atelectasis. Electronically Signed   By: Rolm Baptise M.D.   On: 04/22/2016 07:51   Dg Chest Port 1 View  Result Date: 04/21/2016 CLINICAL DATA:  effusion EXAM: PORTABLE CHEST 1 VIEW COMPARISON:  04/20/2016, 04/18/2016 FINDINGS: Post sternotomy changes. Possible drain or radiopaque 2 being over the upper abdomen/ base of heart, correlate clinically. The right lung is grossly clear. Interval decrease in left-sided pleural effusion. Left basilar atelectasis or infiltrate. Stable cardiomegaly with mild central congestion. No pneumothorax. IMPRESSION: 1. Slight decrease left-sided pleural effusion with mildly improved aeration at the left base. There is a small residual pleural effusion with subjacent atelectasis or infiltrate 2. Stable cardiomegaly Electronically Signed    By: Donavan Foil M.D.   On: 04/21/2016 18:30     Telemetry    Rate controlled Afib with one episode of NSVT overnight - Personally Reviewed  ECG    No new tracings   Cardiac Studies   Echocardiogram 04/20/16: Study Conclusions - Left ventricle: The cavity size was normal. There was mild   concentric hypertrophy. Systolic function was normal. The   estimated ejection fraction was in the range of 50% to 55%. Wall   motion was normal; there were no regional wall motion  abnormalities. - Ventricular septum: Septal motion showed paradox. - Aortic valve: A bioprosthesis was present and functioning   normally. Valve area (VTI): 2.19 cm^2. Valve area (Vmax): 2.15   cm^2. Valve area (Vmean): 2.05 cm^2. - Left atrium: The atrium was severely dilated. - Pericardium, extracardiac: A large pericardial effusion was   identified posterior to the heart and circumferential to the   heart. The fluid contained focal strands. There was mildright   ventricular chamber collapse for less than 50% of the cardiac   cycle. Now in atrial fibrillation, hard to evaluate for   respiratory flow variation. Features were probably consistent   with mild tamponade physiology. There was a left pleural   effusion.  Patient Profile     66 y.o. 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 Roxan Hockey. He did have atrial fib post-op but was in sinus at time of discharge. He is now admitted with volume overload and atrial fibrillation.  Assessment & Plan    1. Acute on chronic diastolic CHF:  - Good diuresis overnight. Negative 2.3liters last 24 hours and is net negative 4 L; weight is down from 226 lb>>213 lb - sCr improved 1.22 (1.45), continue diuresis - Hypervolemia is resolving but still with bilateral pretibial pitting edema.  Will continue IV Lasix today and follow renal function closely.  - continue to  trend BMP   2. Atrial fibrillation,paroxysmal:  Will restart coumadin  - ok per Dr. Roxan Hockey.      3. Anemia: - Post-op anemia - Transfused 1unit of pRBCs 04/18/16 - H/H: 8.5 / 27.1   4. Left pleural effusion: - Discussed with Dr. Roxan Hockey. He is post-op within last 3 weeks from CABG and AVR.  - IR performed US thoracentesis 04/18/16 with 1.4L removed - pleural fluid NGTD   5. Hypokalemia: - resolved   6. Pericardial effusion: S/p window.   Doing well    7. CAD s/p CABG: SVG to OM1 and SVG to PDA. Stable w/o angina   8. Aortic valve disease: s/p AVR. Functioning well by echo 04/17/16   9. Pericarditis:   -in the setting of recent pericardiotomy.  - pleuritic pain is improved; continue BID colchicine    Mertie Moores, MD  04/22/2016 1:11 PM    [place] [place],  [place], [place]  [address] Pager [phone] Phone: [phone]; Fax: [phone]

## 2016-04-22 NOTE — Progress Notes (Signed)
[facility] [place] for heparin  Indication: atrial fibrillation  Allergies  Allergen Reactions  . Crab (Diagnostic) Anaphylaxis  . Penicillins Other (See Comments)    UNSPECIFIED REACTIONS  Has patient had a PCN reaction causing immediate rash, facial/tongue/throat swelling, SOB or lightheadedness with hypotension: Unk Has patient had a PCN reaction causing severe rash involving mucus membranes or skin necrosis: Unk Has patient had a PCN reaction that required hospitalization: Unk Has patient had a PCN reaction occurring within the last 10 years: No If all of the above answers are "NO", then may proceed with Cephalosporin use.   . Statins     UNSPECIFIED REACTION   . Morphine And Related Other (See Comments)    hallucinations    Patient Measurements: Height: 5\' 8"  (172.7 cm) Weight: 215 lb 9.8 oz (97.8 kg) IBW/kg (Calculated) : 68.4 Heparin Dosing Weight: 90kg  Vital Signs: Temp: 97.6 F (36.4 C) (02/10 1149) Temp Source: Oral (02/10 1149) BP: 122/72 (02/10 1132) Pulse Rate: 45 (02/10 1132)  Labs:  Recent Labs  04/20/16 0505 04/20/16 2331 04/21/16 0339 04/22/16 0301 04/22/16 1323 04/22/16 1632  HGB 8.6* 8.9* 8.5* 9.4*  --   --   HCT 27.2* 27.9* 27.1* 30.3*  --   --   PLT 254 261 267 293  --   --   LABPROT  --  14.7  --   --   --   --   INR  --  1.14  --   --   --   --   HEPARINUNFRC 0.42  --   --   --  1.60* 0.76*  CREATININE  --  1.45* 1.22 1.01  --   --     Estimated Creatinine Clearance: 80.5 mL/min (by C-G formula based on SCr of 1.01 mg/dL).  Assessment: 66 yo male was on IV heparin for afib / AVR s/p pericardial window now resumed on IV heparin. Heparin level is slightly elevated at 0.76. No bleeding noted.   Goal of Therapy:  Heparin level 0.3-0.7 units/ml Monitor platelets by anticoagulation protocol: Yes   Plan:  Reduce heparin gtt to 1350 units/hr Check an 8 hr heparin level Daily heparin level and CBC  Salome Arnt, PharmD, BCPS Pager # [phone] 04/22/2016 5:19 PM

## 2016-04-23 ENCOUNTER — Inpatient Hospital Stay (HOSPITAL_COMMUNITY): Payer: Medicare Other

## 2016-04-23 DIAGNOSIS — I241 Dressler's syndrome: Secondary | ICD-10-CM

## 2016-04-23 LAB — COMPREHENSIVE METABOLIC PANEL
ALT: 21 U/L (ref 17–63)
AST: 22 U/L (ref 15–41)
Albumin: 2.7 g/dL — ABNORMAL LOW (ref 3.5–5.0)
Alkaline Phosphatase: 88 U/L (ref 38–126)
Anion gap: 12 (ref 5–15)
BILIRUBIN TOTAL: 0.6 mg/dL (ref 0.3–1.2)
BUN: 24 mg/dL — AB (ref 6–20)
CO2: 25 mmol/L (ref 22–32)
Calcium: 8.6 mg/dL — ABNORMAL LOW (ref 8.9–10.3)
Chloride: 97 mmol/L — ABNORMAL LOW (ref 101–111)
Creatinine, Ser: 1.12 mg/dL (ref 0.61–1.24)
Glucose, Bld: 192 mg/dL — ABNORMAL HIGH (ref 65–99)
POTASSIUM: 4.4 mmol/L (ref 3.5–5.1)
Sodium: 134 mmol/L — ABNORMAL LOW (ref 135–145)
TOTAL PROTEIN: 5.9 g/dL — AB (ref 6.5–8.1)

## 2016-04-23 LAB — GLUCOSE, CAPILLARY
GLUCOSE-CAPILLARY: 187 mg/dL — AB (ref 65–99)
GLUCOSE-CAPILLARY: 262 mg/dL — AB (ref 65–99)
GLUCOSE-CAPILLARY: 155 mg/dL — AB (ref 65–99)
Glucose-Capillary: 154 mg/dL — ABNORMAL HIGH (ref 65–99)

## 2016-04-23 LAB — CBC
HEMATOCRIT: 30.1 % — AB (ref 39.0–52.0)
Hemoglobin: 9.7 g/dL — ABNORMAL LOW (ref 13.0–17.0)
MCH: 26.1 pg (ref 26.0–34.0)
MCHC: 32.2 g/dL (ref 30.0–36.0)
MCV: 81.1 fL (ref 78.0–100.0)
Platelets: 256 10*3/uL (ref 150–400)
RBC: 3.71 MIL/uL — ABNORMAL LOW (ref 4.22–5.81)
RDW: 14.9 % (ref 11.5–15.5)
WBC: 8.6 10*3/uL (ref 4.0–10.5)

## 2016-04-23 LAB — HEPARIN LEVEL (UNFRACTIONATED)
HEPARIN UNFRACTIONATED: 0.17 [IU]/mL — AB (ref 0.30–0.70)
HEPARIN UNFRACTIONATED: 0.29 [IU]/mL — AB (ref 0.30–0.70)
Heparin Unfractionated: 0.28 IU/mL — ABNORMAL LOW (ref 0.30–0.70)

## 2016-04-23 MED ORDER — FUROSEMIDE 40 MG PO TABS
40.0000 mg | ORAL_TABLET | Freq: Every day | ORAL | Status: DC
  Administered 2016-04-24: 40 mg via ORAL
  Filled 2016-04-23: qty 1

## 2016-04-23 MED ORDER — PREDNISONE 10 MG (21) PO TBPK: 10.0000 mg | ORAL_TABLET | Freq: Four times a day (QID) | ORAL | Status: DC

## 2016-04-23 MED ORDER — POTASSIUM CHLORIDE CRYS ER 20 MEQ PO TBCR
20.0000 meq | EXTENDED_RELEASE_TABLET | Freq: Every day | ORAL | Status: DC
Start: 2016-04-24 — End: 2016-04-28
  Administered 2016-04-24 – 2016-04-28 (×5): 20 meq via ORAL
  Filled 2016-04-23 (×5): qty 1

## 2016-04-23 MED ORDER — AMIODARONE HCL 200 MG PO TABS
200.0000 mg | ORAL_TABLET | Freq: Every day | ORAL | Status: DC
  Administered 2016-04-24 – 2016-04-28 (×5): 200 mg via ORAL
  Filled 2016-04-23 (×5): qty 1

## 2016-04-23 MED ORDER — GLIPIZIDE 5 MG PO TABS
10.0000 mg | ORAL_TABLET | Freq: Two times a day (BID) | ORAL | Status: DC
  Administered 2016-04-23 – 2016-04-28 (×11): 10 mg via ORAL
  Filled 2016-04-23 (×11): qty 2

## 2016-04-23 NOTE — Progress Notes (Signed)
Progress Note  Patient Name: Mark Cabrera Date of Encounter: 04/23/2016  Primary Cardiologist: Dr. [name]  Subjective   Pt is s/p pericardial window.  Marland Kitchen Hx of AFib - Is maintaining NSR    Inpatient Medications    Scheduled Meds: . sodium chloride   Intravenous Once  . acetaminophen  1,000 mg Oral Q6H   Or  . acetaminophen (TYLENOL) oral liquid 160 mg/5 mL  1,000 mg Oral Q6H  . amiodarone  200 mg Oral BID PC  . aspirin  325 mg Oral Daily  . bisacodyl  10 mg Oral Daily  . carvedilol  3.125 mg Oral BID WC  . colchicine  0.6 mg Oral BID  . furosemide  40 mg Intravenous Q12H  . glipiZIDE  10 mg Oral BID AC  . Influenza vac split quadrivalent PF  0.5 mL Intramuscular Tomorrow-1000  . insulin aspart  0-15 Units Subcutaneous TID WC  . metFORMIN  1,000 mg Oral BID WC  . pneumococcal 23 valent vaccine  0.5 mL Intramuscular Tomorrow-1000  . potassium chloride  20 mEq Oral BID  . predniSONE  10 mg Oral 4X daily taper  . predniSONE  10 mg Oral 4X daily taper  . senna-docusate  1 tablet Oral QHS  . sodium chloride flush  3 mL Intravenous Q12H  . tamsulosin  0.4 mg Oral QPC supper   Continuous Infusions: . heparin 1,450 Units/hr (04/23/16 0321)  . lactated ringers 10 mL/hr at 04/22/16 2000   PRN Meds: sodium chloride, acetaminophen, fentaNYL (SUBLIMAZE) injection, magnesium hydroxide, nitroGLYCERIN, ondansetron (ZOFRAN) IV, oxyCODONE, potassium chloride (KCL MULTIRUN) 30 mEq in 265 mL IVPB, sodium chloride flush, traMADol   Vital Signs    Vitals:   04/23/16 0330 04/23/16 0703 04/23/16 0707 04/23/16 1100  BP: 111/61  123/69   Pulse: 85  69   Resp: 13  18   Temp: 97.7 F (36.5 C) 97.6 F (36.4 C)  97.6 F (36.4 C)  TempSrc: Oral Oral  Oral  SpO2: 94%  99%   Weight: 213 lb 10 oz (96.9 kg)     Height:        Intake/Output Summary (Last 24 hours) at 04/23/16 1206 Last data filed at 04/23/16 1100  Gross per 24 hour  Intake          1069.76 ml  Output              2825 ml  Net         -1755.24 ml   Filed Weights   04/21/16 2045 04/22/16 0311 04/23/16 0330  Weight: 223 lb 8.7 oz (101.4 kg) 215 lb 9.8 oz (97.8 kg) 213 lb 10 oz (96.9 kg)     Physical Exam   General: Well developed, well nourished, male appearing in no acute distress. Head: Normocephalic, atraumatic.  Neck: Supple without bruits, no JVD Lungs:  Resp regular and unlabored, CTA. Heart: Regular  regular rate, S1, S2, 2/6 holosystolic murmur Abdomen: Soft, non-tender, non-distended with normoactive bowel sounds. No hepatomegaly. No rebound/guarding. No obvious abdominal masses. Extremities: No clubbing, cyanosis, Trace to 1+ B LE edema. Distal pedal pulses are 2+ bilaterally. Neuro: Alert and oriented X 3. Moves all extremities spontaneously. Psych: Normal affect.  Labs    Chemistry  Recent Labs Lab 04/20/16 2331 04/21/16 0339 04/22/16 0301 04/23/16 0156  NA 132* 133* 135 134*  K 4.2 4.4 4.4 4.4  CL 96* 96* 98* 97*  CO2 24 25 26 25   GLUCOSE 370* 309* 193* 192*  BUN 20 21* 19 24*  CREATININE 1.45* 1.22 1.01 1.12  CALCIUM 8.9 8.6* 8.5* 8.6*  PROT 6.6  --   --  5.9*  ALBUMIN 3.1*  --   --  2.7*  AST 25  --   --  22  ALT 20  --   --  21  ALKPHOS 90  --   --  88  BILITOT 0.5  --   --  0.6  GFRNONAA 48* 60* >60 >60  GFRAA 56* >60 >60 >60  ANIONGAP 12 12 11 12      Hematology  Recent Labs Lab 04/21/16 0339 04/22/16 0301 04/23/16 0156  WBC 8.2 8.8 8.6  RBC 3.25* 3.65* 3.71*  HGB 8.5* 9.4* 9.7*  HCT 27.1* 30.3* 30.1*  MCV 83.4 83.0 81.1  MCH 26.2 25.8* 26.1  MCHC 31.4 31.0 32.2  RDW 15.1 15.1 14.9  PLT 267 293 256    Cardiac Enzymes  Recent Labs Lab 04/18/16 2045  TROPONINI 0.15*     Recent Labs Lab 04/16/16 1715  TROPIPOC 0.09*     BNP  Recent Labs Lab 04/16/16 1710  BNP 612.8*     DDimer No results for input(s): DDIMER in the last 168 hours.   Radiology    Dg Chest Port 1 View  Result Date: 04/23/2016 CLINICAL DATA:  atelectasis  EXAM: PORTABLE CHEST 1 VIEW COMPARISON:  Radiograph 04/22/2016 FINDINGS: Sternotomy wires overlie normal cardiac silhouette. Atrial clip noted. There is dense LEFT basilar atelectasis slightly improved compared to prior. Upper lungs are clear. No pneumothorax. IMPRESSION: Improved LEFT lower lobe atelectasis Electronically Signed   By: Suzy Bouchard M.D.   On: 04/23/2016 07:39   Dg Chest Port 1 View  Result Date: 04/22/2016 CLINICAL DATA:  Pericardial effusion EXAM: PORTABLE CHEST 1 VIEW COMPARISON:  04/21/2016 FINDINGS: Cardiomegaly. Prior valve replacement. Left lower lobe atelectasis or infiltrate with small left effusion. Aeration at the left base slightly worsened since prior study. Minimal right base atelectasis. IMPRESSION: Worsening aeration with increasing atelectasis or infiltrate at the left base. Small left effusion. Minimal right base atelectasis. Electronically Signed   By: Rolm Baptise M.D.   On: 04/22/2016 07:51   Dg Chest Port 1 View  Result Date: 04/21/2016 CLINICAL DATA:  effusion EXAM: PORTABLE CHEST 1 VIEW COMPARISON:  04/20/2016, 04/18/2016 FINDINGS: Post sternotomy changes. Possible drain or radiopaque 2 being over the upper abdomen/ base of heart, correlate clinically. The right lung is grossly clear. Interval decrease in left-sided pleural effusion. Left basilar atelectasis or infiltrate. Stable cardiomegaly with mild central congestion. No pneumothorax. IMPRESSION: 1. Slight decrease left-sided pleural effusion with mildly improved aeration at the left base. There is a small residual pleural effusion with subjacent atelectasis or infiltrate 2. Stable cardiomegaly Electronically Signed   By: Donavan Foil M.D.   On: 04/21/2016 18:30     Telemetry    Rate controlled Afib with one episode of NSVT overnight - Personally Reviewed  ECG    No new tracings   Cardiac Studies   Echocardiogram 04/20/16: Study Conclusions - Left ventricle: The cavity size was normal. There was  mild   concentric hypertrophy. Systolic function was normal. The   estimated ejection fraction was in the range of 50% to 55%. Wall   motion was normal; there were no regional wall motion   abnormalities. - Ventricular septum: Septal motion showed paradox. - Aortic valve: A bioprosthesis was present and functioning   normally. Valve area (VTI): 2.19 cm^2. Valve area (Vmax): 2.15  cm^2. Valve area (Vmean): 2.05 cm^2. - Left atrium: The atrium was severely dilated. - Pericardium, extracardiac: A large pericardial effusion was   identified posterior to the heart and circumferential to the   heart. The fluid contained focal strands. There was mildright   ventricular chamber collapse for less than 50% of the cardiac   cycle. Now in atrial fibrillation, hard to evaluate for   respiratory flow variation. Features were probably consistent   with mild tamponade physiology. There was a left pleural   effusion.  Patient Profile     65 y.o. 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 Roxan Hockey. He did have atrial fib post-op but was in sinus at time of discharge. He is now admitted with volume overload and atrial fibrillation.  Assessment & Plan    1. Acute on chronic diastolic CHF:  Doing well. Will change lasix to 40 mg daily and watch to make sure he continues to diurese.  He did not diurese well with PO lasix at home May need to consider Torsemide  2. Atrial fibrillation,paroxysmal:  Will restart coumadin  - ok per Dr. Roxan Hockey.   will reduce amio to 200 mg a day I would anticipate DC amio after treatment for 2-3 months or Per Dr. [name]   3. Anemia: - Post-op anemia - Transfused 1unit of pRBCs 04/18/16 - H/H: 8.5 / 27.1   4. Left pleural effusion: - Discussed with Dr. Roxan Hockey. He is post-op within last 3 weeks from CABG and AVR.  - IR performed US thoracentesis  04/18/16 with 1.4L removed - pleural fluid NGTD   5. Hypokalemia: - resolved   6. Pericardial effusion: On colchicine,   I dont think that he needs another prednisone taper.  S/p window.   Doing well    7. CAD s/p CABG: SVG to OM1 and SVG to PDA. Stable w/o angina   8. Aortic valve disease: s/p AVR. Functioning well by echo 04/17/16   9. Pericarditis:   -in the setting of recent pericardiotomy.  - pleuritic pain is improved; continue BID colchicine I do not think he will need another prednisone taper.     Mertie Moores, MD  04/23/2016 12:06 PM    [address],  [place], [place]  [address] Pager [phone] Phone: [phone]; Fax: [phone]

## 2016-04-23 NOTE — Progress Notes (Signed)
ANTICOAGULATION CONSULT NOTE - Follow Up Consult  Pharmacy Consult for heparin Indication: atrial fibrillation   Labs:  Recent Labs  04/20/16 2331 04/21/16 0339 04/22/16 0301 04/22/16 1323 04/22/16 1632 04/23/16 0156  HGB 8.9* 8.5* 9.4*  --   --  9.7*  HCT 27.9* 27.1* 30.3*  --   --  30.1*  PLT 261 267 293  --   --  256  LABPROT 14.7  --   --   --   --   --   INR 1.14  --   --   --   --   --   HEPARINUNFRC  --   --   --  1.60* 0.76* 0.17*  CREATININE 1.45* 1.22 1.01  --   --  1.12     Assessment: 66yo male now subtherapeutic on heparin after rate decrease.  Goal of Therapy:  Heparin level 0.3-0.7 units/ml   Plan:  Prior to last level labs were very stable at 1450 units/hr, will increase back to this rate and check level in 6hr.  Wynona Neat, PharmD, BCPS  04/23/2016,3:05 AM

## 2016-04-23 NOTE — Progress Notes (Addendum)
      [address]       [place] [address]             [phone]      2 Days Post-Op Procedure(s) (LRB): SUBXYPHOID PERICARDIAL WINDOW (N/A) TRANSESOPHAGEAL ECHOCARDIOGRAM (TEE) (N/A)   Subjective:  No complaints.  Feeling somewhat better.  However, states he hasn't moved his bowels in over a week.  Objective: Vital signs in last 24 hours: Temp:  [97.6 F (36.4 C)-98.5 F (36.9 C)] 97.6 F (36.4 C) (02/11 0703) Pulse Rate:  [45-87] 69 (02/11 0707) Cardiac Rhythm: Atrial fibrillation (02/11 0800) Resp:  [13-24] 18 (02/11 0707) BP: (103-124)/(61-86) 123/69 (02/11 0707) SpO2:  [94 %-100 %] 99 % (02/11 0707) Weight:  [213 lb 10 oz (96.9 kg)] 213 lb 10 oz (96.9 kg) (02/11 0330)  Intake/Output from previous day: 02/10 0701 - 02/11 0700 In: 1069.8 [P.O.:480; I.V.:589.8] Out: 2475 [Urine:2100; Drains:375] Intake/Output this shift: Total I/O In: -  Out: 750 [Urine:750]  General appearance: alert, cooperative and no distress Heart: regular rate and rhythm Lungs: clear to auscultation bilaterally Abdomen: soft, non-tender; bowel sounds normal; no masses,  no organomegaly Extremities: extremities normal, atraumatic, no cyanosis or edema Wound: clean and dry  Lab Results:  Recent Labs  04/22/16 0301 04/23/16 0156  WBC 8.8 8.6  HGB 9.4* 9.7*  HCT 30.3* 30.1*  PLT 293 256   BMET:  Recent Labs  04/22/16 0301 04/23/16 0156  NA 135 134*  K 4.4 4.4  CL 98* 97*  CO2 26 25  GLUCOSE 193* 192*  BUN 19 24*  CREATININE 1.01 1.12  CALCIUM 8.5* 8.6*    PT/INR:  Recent Labs  04/20/16 2331  LABPROT 14.7  INR 1.14   ABG    Component Value Date/Time   PHART 7.465 (H) 04/22/2016 0325   HCO3 26.8 04/22/2016 0325   TCO2 24 03/31/2016 1614   ACIDBASEDEF 3.0 (H) 03/30/2016 1850   O2SAT 94.7 04/22/2016 0325   CBG (last 3)   Recent Labs  04/22/16 1724 04/22/16 2127 04/23/16 0856  GLUCAP 265* 184* 187*    Assessment/Plan: S/P Procedure(s)  (LRB): SUBXYPHOID PERICARDIAL WINDOW (N/A) TRANSESOPHAGEAL ECHOCARDIOGRAM (TEE) (N/A)  1. CV- PAF, currently NSR, 375 cc output from pericardial drain yesterday- will leave in place... Continue colchicine, steroids for pericarditis 2. Pulm- wean oxygen as tolerated, Atelectasis improved on CXR... Continue IS 3. Renal- creatinine at 1.12, on diuretics per Cardiology 4. DM- sugars elevated at times, likely due to steroid use, continue current diabetes regimen 5. Dispo- care per Cardiology, continue pericardial drain till output decreases   LOS: 7 days    BARRETT, ERIN 04/23/2016 Patient seen and examined.  Still relatively high output from drain, leave in place Will give another round of steroids and continue diuresis  Remo Lipps C. Roxan Hockey, MD [facility] [phone]

## 2016-04-23 NOTE — Progress Notes (Signed)
ANTICOAGULATION CONSULT NOTE - Follow Up Consult  Pharmacy Consult for Heparin Indication: atrial fibrillation and AVR   Allergies  Allergen Reactions  . Crab (Diagnostic) Anaphylaxis  . Penicillins Other (See Comments)    UNSPECIFIED REACTIONS  Has patient had a PCN reaction causing immediate rash, facial/tongue/throat swelling, SOB or lightheadedness with hypotension: Unk Has patient had a PCN reaction causing severe rash involving mucus membranes or skin necrosis: Unk Has patient had a PCN reaction that required hospitalization: Unk Has patient had a PCN reaction occurring within the last 10 years: No If all of the above answers are "NO", then may proceed with Cephalosporin use.   . Statins     UNSPECIFIED REACTION   . Morphine And Related Other (See Comments)    hallucinations    Patient Measurements: Height: 5\' 8"  (172.7 cm) Weight: 213 lb 10 oz (96.9 kg) IBW/kg (Calculated) : 68.4 Heparin Dosing Weight: 90 kg  Vital Signs: Temp: 97.8 F (36.6 C) (02/11 1610) Temp Source: Oral (02/11 1610) BP: 127/71 (02/11 1203) Pulse Rate: 71 (02/11 1203)  Labs:  Recent Labs  04/20/16 2331 04/21/16 0339 04/22/16 0301  04/23/16 0156 04/23/16 1107 04/23/16 1641  HGB 8.9* 8.5* 9.4*  --  9.7*  --   --   HCT 27.9* 27.1* 30.3*  --  30.1*  --   --   PLT 261 267 293  --  256  --   --   LABPROT 14.7  --   --   --   --   --   --   INR 1.14  --   --   --   --   --   --   HEPARINUNFRC  --   --   --   < > 0.17* 0.28* 0.29*  CREATININE 1.45* 1.22 1.01  --  1.12  --   --   < > = values in this interval not displayed.  Estimated Creatinine Clearance: 72.2 mL/min (by C-G formula based on SCr of 1.12 mg/dL).   Medications:  Infusions:  . heparin 1,450 Units/hr (04/23/16 0321)  . lactated ringers 10 mL/hr at 04/22/16 2000   Assessment: 66 yo male was on IV heparin for afib / AVR s/p pericardial window now resumed on IV heparin.   Repeat HL remains subtherapeutic at 0.29 on  heparin 1450 units/hr. Nurse reports no issues with infusion or bleeding.  Goal of Therapy:  Heparin level 0.3-0.7 units/ml Monitor platelets by anticoagulation protocol: Yes   Plan:  Increase heparin to 1500 units/hr 6h HL Monitor daily HL and CBC   Andrey Cota. Diona Foley, PharmD, BCPS Clinical Pharmacist [phone] 04/23/2016 5:28 PM

## 2016-04-23 NOTE — Progress Notes (Signed)
ANTICOAGULATION CONSULT NOTE - Follow Up Consult  Pharmacy Consult for Heparin Indication: atrial fibrillation and AVR  Assessment: 66 yo male was on IV heparin for afib / AVR s/p pericardial window now resumed on IV heparin. Heparin was restarted on 2/10 at 1450 units/hr (previously therapeutic at this dose), went supratherapeutic, dose reduced, went subtherapeutic, dose increased back to 1450 units/hr. Now, heparin level remains slightly subtherapeutic (0.28) on 1450 units/hr without any issues with the line or pause in therapy per RN. Cautious to increase since patient was supratherapeutic yesterday on this dose without a bolus. Hemoglobin is low and stable (9.7) with platelets wnl. No overt bleeding noted.   Goal of Therapy:  Heparin level 0.3-0.7 units/ml Monitor platelets by anticoagulation protocol: Yes   Plan:  Continue heparin 1450 units/hr Check heparin level later this afternoon  Monitor daily HL and CBC Follow-up transition to oral warfarin once procedures complete   Allergies  Allergen Reactions  . Crab (Diagnostic) Anaphylaxis  . Penicillins Other (See Comments)    UNSPECIFIED REACTIONS  Has patient had a PCN reaction causing immediate rash, facial/tongue/throat swelling, SOB or lightheadedness with hypotension: Unk Has patient had a PCN reaction causing severe rash involving mucus membranes or skin necrosis: Unk Has patient had a PCN reaction that required hospitalization: Unk Has patient had a PCN reaction occurring within the last 10 years: No If all of the above answers are "NO", then may proceed with Cephalosporin use.   . Statins     UNSPECIFIED REACTION   . Morphine And Related Other (See Comments)    hallucinations    Patient Measurements: Height: 5\' 8"  (172.7 cm) Weight: 213 lb 10 oz (96.9 kg) IBW/kg (Calculated) : 68.4 Heparin Dosing Weight: 90 kg  Vital Signs: Temp: 97.6 F (36.4 C) (02/11 1100) Temp Source: Oral (02/11 1100) BP: 123/69 (02/11  0707) Pulse Rate: 69 (02/11 0707)  Labs:  Recent Labs  04/20/16 2331 04/21/16 0339 04/22/16 0301  04/22/16 1632 04/23/16 0156 04/23/16 1107  HGB 8.9* 8.5* 9.4*  --   --  9.7*  --   HCT 27.9* 27.1* 30.3*  --   --  30.1*  --   PLT 261 267 293  --   --  256  --   LABPROT 14.7  --   --   --   --   --   --   INR 1.14  --   --   --   --   --   --   HEPARINUNFRC  --   --   --   < > 0.76* 0.17* 0.28*  CREATININE 1.45* 1.22 1.01  --   --  1.12  --   < > = values in this interval not displayed.  Estimated Creatinine Clearance: 72.2 mL/min (by C-G formula based on SCr of 1.12 mg/dL).   Medications:  Infusions:  . heparin 1,450 Units/hr (04/23/16 0321)  . lactated ringers 10 mL/hr at 04/22/16 Bella Kennedy, PharmD PGY1 Pharmacy Resident [id number] [phone] 04/23/2016 12:33 PM

## 2016-04-24 ENCOUNTER — Inpatient Hospital Stay (HOSPITAL_COMMUNITY): Payer: Medicare Other

## 2016-04-24 ENCOUNTER — Encounter: Payer: Self-pay | Admitting: Cardiology

## 2016-04-24 LAB — GLUCOSE, CAPILLARY
GLUCOSE-CAPILLARY: 109 mg/dL — AB (ref 65–99)
Glucose-Capillary: 182 mg/dL — ABNORMAL HIGH (ref 65–99)
Glucose-Capillary: 70 mg/dL (ref 65–99)
Glucose-Capillary: 59 mg/dL — ABNORMAL LOW (ref 65–99)

## 2016-04-24 LAB — BASIC METABOLIC PANEL
Anion gap: 10 (ref 5–15)
BUN: 20 mg/dL (ref 6–20)
CO2: 25 mmol/L (ref 22–32)
Calcium: 8.4 mg/dL — ABNORMAL LOW (ref 8.9–10.3)
Chloride: 100 mmol/L — ABNORMAL LOW (ref 101–111)
Creatinine, Ser: 0.94 mg/dL (ref 0.61–1.24)
Glucose, Bld: 94 mg/dL (ref 65–99)
Potassium: 3.8 mmol/L (ref 3.5–5.1)
SODIUM: 135 mmol/L (ref 135–145)

## 2016-04-24 LAB — CBC
HCT: 30.2 % — ABNORMAL LOW (ref 39.0–52.0)
Hemoglobin: 9.5 g/dL — ABNORMAL LOW (ref 13.0–17.0)
MCH: 25.7 pg — ABNORMAL LOW (ref 26.0–34.0)
MCHC: 31.5 g/dL (ref 30.0–36.0)
MCV: 81.8 fL (ref 78.0–100.0)
PLATELETS: 257 10*3/uL (ref 150–400)
RBC: 3.69 MIL/uL — AB (ref 4.22–5.81)
RDW: 14.9 % (ref 11.5–15.5)
WBC: 5.5 10*3/uL (ref 4.0–10.5)

## 2016-04-24 LAB — HEPARIN LEVEL (UNFRACTIONATED)
HEPARIN UNFRACTIONATED: 0.31 [IU]/mL (ref 0.30–0.70)
HEPARIN UNFRACTIONATED: 0.41 [IU]/mL (ref 0.30–0.70)
Heparin Unfractionated: 0.28 IU/mL — ABNORMAL LOW (ref 0.30–0.70)

## 2016-04-24 MED ORDER — WARFARIN SODIUM 5 MG PO TABS
7.5000 mg | ORAL_TABLET | Freq: Once | ORAL | Status: AC
  Administered 2016-04-24: 7.5 mg via ORAL
  Filled 2016-04-24: qty 2

## 2016-04-24 MED ORDER — FUROSEMIDE 40 MG PO TABS
40.0000 mg | ORAL_TABLET | Freq: Two times a day (BID) | ORAL | Status: DC
  Administered 2016-04-24 – 2016-04-28 (×9): 40 mg via ORAL
  Filled 2016-04-24 (×9): qty 1

## 2016-04-24 MED ORDER — LOPERAMIDE HCL 1 MG/5ML PO LIQD
4.0000 mg | ORAL | Status: DC | PRN
  Filled 2016-04-24 (×3): qty 20

## 2016-04-24 MED ORDER — WARFARIN - PHARMACIST DOSING INPATIENT
Freq: Every day | Status: DC
  Administered 2016-04-24: 1

## 2016-04-24 NOTE — Progress Notes (Signed)
Progress Note  Patient Name: Mark Cabrera Date of Encounter: 04/24/2016  Primary Cardiologist: Dr. [name]  Subjective   Feels better.  Still has significant output from his pericardial drain.  Inpatient Medications    Scheduled Meds: . sodium chloride   Intravenous Once  . acetaminophen  1,000 mg Oral Q6H   Or  . acetaminophen (TYLENOL) oral liquid 160 mg/5 mL  1,000 mg Oral Q6H  . amiodarone  200 mg Oral Daily  . aspirin  325 mg Oral Daily  . bisacodyl  10 mg Oral Daily  . carvedilol  3.125 mg Oral BID WC  . colchicine  0.6 mg Oral BID  . furosemide  40 mg Oral BID  . glipiZIDE  10 mg Oral BID AC  . Influenza vac split quadrivalent PF  0.5 mL Intramuscular Tomorrow-1000  . insulin aspart  0-15 Units Subcutaneous TID WC  . metFORMIN  1,000 mg Oral BID WC  . pneumococcal 23 valent vaccine  0.5 mL Intramuscular Tomorrow-1000  . potassium chloride  20 mEq Oral Daily  . senna-docusate  1 tablet Oral QHS  . sodium chloride flush  3 mL Intravenous Q12H  . tamsulosin  0.4 mg Oral QPC supper  . warfarin  7.5 mg Oral ONCE-1800  . Warfarin - Pharmacist Dosing Inpatient   Does not apply q1800   Continuous Infusions: . heparin 1,600 Units/hr (04/24/16 1120)  . lactated ringers 10 mL/hr at 04/23/16 1900   PRN Meds: sodium chloride, acetaminophen, fentaNYL (SUBLIMAZE) injection, magnesium hydroxide, nitroGLYCERIN, ondansetron (ZOFRAN) IV, oxyCODONE, potassium chloride (KCL MULTIRUN) 30 mEq in 265 mL IVPB, sodium chloride flush, traMADol   Vital Signs    Vitals:   04/23/16 2311 04/24/16 0400 04/24/16 0709 04/24/16 1117  BP: 138/69 115/82 128/69 112/74  Pulse: 71  67 70  Resp: 18  (!) 22 17  Temp: 98.5 F (36.9 C) 98.5 F (36.9 C) 98.5 F (36.9 C) 98 F (36.7 C)  TempSrc: Oral Oral Oral Oral  SpO2: 98%  99% 98%  Weight:  214 lb 1.1 oz (97.1 kg)    Height:        Intake/Output Summary (Last 24 hours) at 04/24/16 1231 Last data filed at 04/24/16 1200  Gross per 24  hour  Intake           948.74 ml  Output              895 ml  Net            53.74 ml   Filed Weights   04/22/16 0311 04/23/16 0330 04/24/16 0400  Weight: 215 lb 9.8 oz (97.8 kg) 213 lb 10 oz (96.9 kg) 214 lb 1.1 oz (97.1 kg)    Telemetry    NSR - Personally Reviewed  ECG    AFib on 2/9 - Personally Reviewed  Physical Exam   GEN: No acute distress.   Neck: No JVD Cardiac: RRR,  Respiratory: No wheezing GI: Soft, nontender, non-distended  MS: No edema; No deformity. Neuro:  Nonfocal  Psych: Normal affect   Labs    Chemistry Recent Labs Lab 04/20/16 2331  04/22/16 0301 04/23/16 0156 04/24/16 0551  NA 132*  < > 135 134* 135  K 4.2  < > 4.4 4.4 3.8  CL 96*  < > 98* 97* 100*  CO2 24  < > 26 25 25   GLUCOSE 370*  < > 193* 192* 94  BUN 20  < > 19 24* 20  CREATININE 1.45*  < >  1.01 1.12 0.94  CALCIUM 8.9  < > 8.5* 8.6* 8.4*  PROT 6.6  --   --  5.9*  --   ALBUMIN 3.1*  --   --  2.7*  --   AST 25  --   --  22  --   ALT 20  --   --  21  --   ALKPHOS 90  --   --  88  --   BILITOT 0.5  --   --  0.6  --   GFRNONAA 48*  < > >60 >60 >60  GFRAA 56*  < > >60 >60 >60  ANIONGAP 12  < > 11 12 10   < > = values in this interval not displayed.   Hematology Recent Labs Lab 04/22/16 0301 04/23/16 0156 04/24/16 0551  WBC 8.8 8.6 5.5  RBC 3.65* 3.71* 3.69*  HGB 9.4* 9.7* 9.5*  HCT 30.3* 30.1* 30.2*  MCV 83.0 81.1 81.8  MCH 25.8* 26.1 25.7*  MCHC 31.0 32.2 31.5  RDW 15.1 14.9 14.9  PLT 293 256 257    Cardiac Enzymes Recent Labs Lab 04/18/16 2045  TROPONINI 0.15*   No results for input(s): TROPIPOC in the last 168 hours.   BNPNo results for input(s): BNP, PROBNP in the last 168 hours.   DDimer No results for input(s): DDIMER in the last 168 hours.   Radiology    Dg Chest Port 1 View  Result Date: 04/24/2016 CLINICAL DATA:  Pleural effusion. EXAM: PORTABLE CHEST 1 VIEW COMPARISON:  04/23/2016 . FINDINGS: Prior CABG. Cardiomegaly with normal pulmonary  vascularity. Left lower lobe atelectasis and or infiltrate with small left pleural effusion. No pneumothorax. IMPRESSION: 1.  Prior CABG.  Cardiomegaly with normal pulmonary vascularity. 2. Left lower lobe atelectasis and/or infiltrate with small left pleural effusion . No change from prior exam. Electronically Signed   By: Marcello Moores  Register   On: 04/24/2016 07:22   Dg Chest Port 1 View  Result Date: 04/23/2016 CLINICAL DATA:  atelectasis EXAM: PORTABLE CHEST 1 VIEW COMPARISON:  Radiograph 04/22/2016 FINDINGS: Sternotomy wires overlie normal cardiac silhouette. Atrial clip noted. There is dense LEFT basilar atelectasis slightly improved compared to prior. Upper lungs are clear. No pneumothorax. IMPRESSION: Improved LEFT lower lobe atelectasis Electronically Signed   By: Suzy Bouchard M.D.   On: 04/23/2016 07:39    Cardiac Studies   Recent CABG  Patient Profile     66 y.o. male with CABG and now pericardial drain  Assessment & Plan    1) AFib:  Pharmacy to dose warfarin today.  Stop heparin when INR therapeutic.  Amio decreased.  Continue for 2-3 months.   2) Lasix increased due to drain output.  Signed, Larae Grooms, MD  04/24/2016, 12:31 PM

## 2016-04-24 NOTE — Progress Notes (Signed)
ANTICOAGULATION CONSULT NOTE - Follow Up Consult  Pharmacy Consult for heparin Indication: atrial fibrillation   Labs:  Recent Labs  04/22/16 0301  04/23/16 0156  04/23/16 2339 04/24/16 0551 04/24/16 1244  HGB 9.4*  --  9.7*  --   --  9.5*  --   HCT 30.3*  --  30.1*  --   --  30.2*  --   PLT 293  --  256  --   --  257  --   HEPARINUNFRC  --   < > 0.17*  < > 0.28* 0.31 0.41  CREATININE 1.01  --  1.12  --   --  0.94  --   < > = values in this interval not displayed.   Assessment: 66yo male s/p AVR (bioprosthetic) on 03/30/16 here for SOB and noted with afib. CHADSVasC = 4. Heparin restarted s/p pericardial window. Last heparin level remains therapeutic at 0.41. Now to transition to Coumadin. Last INR was 1.14 on 2/8. Hgb 9.1, plts wnl. No s/s of bleed.   Goal of Therapy:  Heparin level 0.3-0.7 units/ml   Plan:  Continue heparin gtt at 1600 units/hr Give Coumadin 7.5mg  PO x 1 tonight Monitor daily HL / INR and CBC  Elenor Quinones, PharmD, BCPS Clinical Pharmacist Pager [phone] 04/24/2016 2:01 PM

## 2016-04-24 NOTE — Care Management Important Message (Signed)
Important Message  Patient Details  Name: Mark Cabrera MRN: [id number] Date of Birth: Mar 20, 1950   Medicare Important Message Given:  Yes    Orbie Pyo 04/24/2016, 4:24 PM

## 2016-04-24 NOTE — Progress Notes (Signed)
ANTICOAGULATION CONSULT NOTE - Follow Up Consult  Pharmacy Consult for heparin Indication: atrial fibrillation   Labs:  Recent Labs  04/21/16 0339 04/22/16 0301  04/23/16 0156 04/23/16 1107 04/23/16 1641 04/23/16 2339  HGB 8.5* 9.4*  --  9.7*  --   --   --   HCT 27.1* 30.3*  --  30.1*  --   --   --   PLT 267 293  --  256  --   --   --   HEPARINUNFRC  --   --   < > 0.17* 0.28* 0.29* 0.28*  CREATININE 1.22 1.01  --  1.12  --   --   --   < > = values in this interval not displayed.   Assessment: 66yo male remains subtherapeutic on heparin with no change in level after rate increase.  Goal of Therapy:  Heparin level 0.3-0.7 units/ml   Plan:  Will increase heparin gtt slightly to 1600 units/hr and check level in 6hr.  Wynona Neat, PharmD, BCPS  04/24/2016,12:22 AM

## 2016-04-24 NOTE — Progress Notes (Addendum)
[address]       [place] [address]             [phone]      3 Days Post-Op Procedure(s) (LRB): SUBXYPHOID PERICARDIAL WINDOW (N/A) TRANSESOPHAGEAL ECHOCARDIOGRAM (TEE) (N/A) Subjective: Feels a little better  Objective: Vital signs in last 24 hours: Temp:  [97.6 F (36.4 C)-98.5 F (36.9 C)] 98.5 F (36.9 C) (02/12 0400) Pulse Rate:  [68-72] 71 (02/11 2311) Cardiac Rhythm: Heart block;Normal sinus rhythm (02/12 0709) Resp:  [11-18] 18 (02/11 2311) BP: (115-138)/(65-82) 115/82 (02/12 0400) SpO2:  [98 %-100 %] 98 % (02/11 2311) Weight:  [214 lb 1.1 oz (97.1 kg)] 214 lb 1.1 oz (97.1 kg) (02/12 0400)  Hemodynamic parameters for last 24 hours:    Intake/Output from previous day: 02/11 0701 - 02/12 0700 In: 432.7 [I.V.:432.7] Out: 1225 [Urine:750; Drains:475] Intake/Output this shift: No intake/output data recorded.  General appearance: alert, cooperative, fatigued and no distress Heart: regular rate and rhythm and soft systolic murmur Lungs: tubular in left lower field Abdomen: benign Extremities: no edema Wound: incis healing well  Lab Results:  Recent Labs  04/23/16 0156 04/24/16 0551  WBC 8.6 5.5  HGB 9.7* 9.5*  HCT 30.1* 30.2*  PLT 256 257   BMET:  Recent Labs  04/23/16 0156 04/24/16 0551  NA 134* 135  K 4.4 3.8  CL 97* 100*  CO2 25 25  GLUCOSE 192* 94  BUN 24* 20  CREATININE 1.12 0.94  CALCIUM 8.6* 8.4*    PT/INR: No results for input(s): LABPROT, INR in the last 72 hours. ABG    Component Value Date/Time   PHART 7.465 (H) 04/22/2016 0325   HCO3 26.8 04/22/2016 0325   TCO2 24 03/31/2016 1614   ACIDBASEDEF 3.0 (H) 03/30/2016 1850   O2SAT 94.7 04/22/2016 0325   CBG (last 3)   Recent Labs  04/23/16 1746 04/23/16 2110 04/24/16 0747  GLUCAP 155* 154* 109*    Meds Scheduled Meds: . sodium chloride   Intravenous Once  . acetaminophen  1,000 mg Oral Q6H   Or  . acetaminophen (TYLENOL) oral liquid 160  mg/5 mL  1,000 mg Oral Q6H  . amiodarone  200 mg Oral Daily  . aspirin  325 mg Oral Daily  . bisacodyl  10 mg Oral Daily  . carvedilol  3.125 mg Oral BID WC  . colchicine  0.6 mg Oral BID  . furosemide  40 mg Oral Daily  . glipiZIDE  10 mg Oral BID AC  . Influenza vac split quadrivalent PF  0.5 mL Intramuscular Tomorrow-1000  . insulin aspart  0-15 Units Subcutaneous TID WC  . metFORMIN  1,000 mg Oral BID WC  . pneumococcal 23 valent vaccine  0.5 mL Intramuscular Tomorrow-1000  . potassium chloride  20 mEq Oral Daily  . senna-docusate  1 tablet Oral QHS  . sodium chloride flush  3 mL Intravenous Q12H  . tamsulosin  0.4 mg Oral QPC supper   Continuous Infusions: . heparin 1,600 Units/hr (04/24/16 0026)  . lactated ringers 10 mL/hr at 04/23/16 1900   PRN Meds:.sodium chloride, acetaminophen, fentaNYL (SUBLIMAZE) injection, magnesium hydroxide, nitroGLYCERIN, ondansetron (ZOFRAN) IV, oxyCODONE, potassium chloride (KCL MULTIRUN) 30 mEq in 265 mL IVPB, sodium chloride flush, traMADol  Xrays Dg Chest Port 1 View  Result Date: 04/24/2016 CLINICAL DATA:  Pleural effusion. EXAM: PORTABLE CHEST 1 VIEW COMPARISON:  04/23/2016 . FINDINGS: Prior CABG. Cardiomegaly with normal pulmonary vascularity. Left lower lobe atelectasis and  or infiltrate with small left pleural effusion. No pneumothorax. IMPRESSION: 1.  Prior CABG.  Cardiomegaly with normal pulmonary vascularity. 2. Left lower lobe atelectasis and/or infiltrate with small left pleural effusion . No change from prior exam. Electronically Signed   By: Marcello Moores  Register   On: 04/24/2016 07:22   Dg Chest Port 1 View  Result Date: 04/23/2016 CLINICAL DATA:  atelectasis EXAM: PORTABLE CHEST 1 VIEW COMPARISON:  Radiograph 04/22/2016 FINDINGS: Sternotomy wires overlie normal cardiac silhouette. Atrial clip noted. There is dense LEFT basilar atelectasis slightly improved compared to prior. Upper lungs are clear. No pneumothorax. IMPRESSION: Improved  LEFT lower lobe atelectasis Electronically Signed   By: Suzy Bouchard M.D.   On: 04/23/2016 07:39    Assessment/Plan: S/P Procedure(s) (LRB): SUBXYPHOID PERICARDIAL WINDOW (N/A) TRANSESOPHAGEAL ECHOCARDIOGRAM (TEE) (N/A)   1 JP put out 550 ml yesterday- keep in place 2 junctional with adequate rate 3 labs are stable 4 glucose fair control 5 CXR appearance is stable 6 ? Transition to coumadin  LOS: 8 days    GOLD,WAYNE E 04/24/2016 Patient seen and examined Still has remarkably high output from pericardial drain- will increase lasix to BID To start coumadin today  Remo Lipps C. Roxan Hockey, MD [facility] [phone]

## 2016-04-24 NOTE — Discharge Instructions (Addendum)
Heart Healthy low sodium diet and diabetic  Weigh daily if weight increases more than 3 lbs in a day or 5 lbs in a week please call the office or any increased swelling.      Information on my medicine - Coumadin   (Warfarin)  This medication education was reviewed with me or my healthcare representative as part of my discharge preparation.  The pharmacist that spoke with me during my hospital stay was:  Reginia Naas, [facility]  Why was Coumadin prescribed for you? Coumadin was prescribed for you because you have a blood clot or a medical condition that can cause an increased risk of forming blood clots. Blood clots can cause serious health problems by blocking the flow of blood to the heart, lung, or brain. Coumadin can prevent harmful blood clots from forming. As a reminder your indication for Coumadin is:   Blood Clot Prevention After Heart Valve Surgery  What test will check on my response to Coumadin? While on Coumadin (warfarin) you will need to have an INR test regularly to ensure that your dose is keeping you in the desired range. The INR (international normalized ratio) number is calculated from the result of the laboratory test called prothrombin time (PT).  If an INR APPOINTMENT HAS NOT ALREADY BEEN MADE FOR YOU please schedule an appointment to have this lab work done by your health care provider within 7 days. Your INR goal is usually a number between:  2 to 3 or your provider may give you a more narrow range like 2-2.5.  Ask your health care provider during an office visit what your goal INR is.  What  do you need to  know  About  COUMADIN? Take Coumadin (warfarin) exactly as prescribed by your healthcare provider about the same time each day.  DO NOT stop taking without talking to the doctor who prescribed the medication.  Stopping without other blood clot prevention medication to take the place of Coumadin may increase your risk of developing a new clot or stroke.  Get refills  before you run out.  What do you do if you miss a dose? If you miss a dose, take it as soon as you remember on the same day then continue your regularly scheduled regimen the next day.  Do not take two doses of Coumadin at the same time.  Important Safety Information A possible side effect of Coumadin (Warfarin) is an increased risk of bleeding. You should call your healthcare provider right away if you experience any of the following: ? Bleeding from an injury or your nose that does not stop. ? Unusual colored urine (red or dark [place]) or unusual colored stools (red or black). ? Unusual bruising for unknown reasons. ? A serious fall or if you hit your head (even if there is no bleeding).  Some foods or medicines interact with Coumadin (warfarin) and might alter your response to warfarin. To help avoid this: ? Eat a balanced diet, maintaining a consistent amount of Vitamin K. ? Notify your provider about major diet changes you plan to make. ? Avoid alcohol or limit your intake to 1 drink for women and 2 drinks for men per day. (1 drink is 5 oz. wine, 12 oz. beer, or 1.5 oz. liquor.)  Make sure that ANY health care provider who prescribes medication for you knows that you are taking Coumadin (warfarin).  Also make sure the healthcare provider who is monitoring your Coumadin knows when you have started a new  medication including herbals and non-prescription products.  Coumadin (Warfarin)  Major Drug Interactions  Increased Warfarin Effect Decreased Warfarin Effect  Alcohol (large quantities) Antibiotics (esp. Septra/Bactrim, Flagyl, Cipro) Amiodarone (Cordarone) Aspirin (ASA) Cimetidine (Tagamet) Megestrol (Megace) NSAIDs (ibuprofen, naproxen, etc.) Piroxicam (Feldene) Propafenone (Rythmol SR) Propranolol (Inderal) Isoniazid (INH) Posaconazole (Noxafil) Barbiturates (Phenobarbital) Carbamazepine (Tegretol) Chlordiazepoxide (Librium) Cholestyramine (Questran) Griseofulvin Oral  Contraceptives Rifampin Sucralfate (Carafate) Vitamin K   Coumadin (Warfarin) Major Herbal Interactions  Increased Warfarin Effect Decreased Warfarin Effect  Garlic Ginseng Ginkgo biloba Coenzyme Q10 Green tea St. Johns wort    Coumadin (Warfarin) FOOD Interactions  Eat a consistent number of servings per week of foods HIGH in Vitamin K (1 serving =  cup)  Collards (cooked, or boiled & drained) Kale (cooked, or boiled & drained) Mustard greens (cooked, or boiled & drained) Parsley *serving size only =  cup Spinach (cooked, or boiled & drained) Swiss chard (cooked, or boiled & drained) Turnip greens (cooked, or boiled & drained)  Eat a consistent number of servings per week of foods MEDIUM-HIGH in Vitamin K (1 serving = 1 cup)  Asparagus (cooked, or boiled & drained) Broccoli (cooked, boiled & drained, or raw & chopped) Brussel sprouts (cooked, or boiled & drained) *serving size only =  cup Lettuce, raw (green leaf, endive, romaine) Spinach, raw Turnip greens, raw & chopped   These websites have more information on Coumadin (warfarin):  FailFactory.se; VeganReport.com.au;

## 2016-04-25 ENCOUNTER — Inpatient Hospital Stay (HOSPITAL_COMMUNITY): Payer: Medicare Other

## 2016-04-25 DIAGNOSIS — I319 Disease of pericardium, unspecified: Secondary | ICD-10-CM

## 2016-04-25 LAB — GLUCOSE, CAPILLARY
GLUCOSE-CAPILLARY: 83 mg/dL (ref 65–99)
GLUCOSE-CAPILLARY: 211 mg/dL — AB (ref 65–99)
GLUCOSE-CAPILLARY: 70 mg/dL (ref 65–99)
GLUCOSE-CAPILLARY: 69 mg/dL (ref 65–99)
Glucose-Capillary: 94 mg/dL (ref 65–99)
Glucose-Capillary: 67 mg/dL (ref 65–99)

## 2016-04-25 LAB — ECHOCARDIOGRAM LIMITED
HEIGHTINCHES: 68 in
WEIGHTICAEL: 3301.61 [oz_av]

## 2016-04-25 LAB — CBC
HCT: 30.1 % — ABNORMAL LOW (ref 39.0–52.0)
Hemoglobin: 9.6 g/dL — ABNORMAL LOW (ref 13.0–17.0)
MCH: 25.6 pg — ABNORMAL LOW (ref 26.0–34.0)
MCHC: 31.9 g/dL (ref 30.0–36.0)
MCV: 80.3 fL (ref 78.0–100.0)
PLATELETS: 279 10*3/uL (ref 150–400)
RBC: 3.75 MIL/uL — AB (ref 4.22–5.81)
RDW: 14.6 % (ref 11.5–15.5)
WBC: 5 10*3/uL (ref 4.0–10.5)

## 2016-04-25 LAB — HEPARIN LEVEL (UNFRACTIONATED): Heparin Unfractionated: 0.53 IU/mL (ref 0.30–0.70)

## 2016-04-25 LAB — PROTIME-INR
INR: 1.09
Prothrombin Time: 14.2 seconds (ref 11.4–15.2)

## 2016-04-25 MED ORDER — WARFARIN SODIUM 5 MG PO TABS
5.0000 mg | ORAL_TABLET | Freq: Once | ORAL | Status: AC
  Administered 2016-04-25: 5 mg via ORAL
  Filled 2016-04-25: qty 1

## 2016-04-25 NOTE — Progress Notes (Signed)
  Echocardiogram 2D Echocardiogram has been performed.  Mark Cabrera 04/25/2016, 1:07 PM

## 2016-04-25 NOTE — Progress Notes (Signed)
Progress Note  Patient Name: Mark Cabrera Date of Encounter: 04/25/2016  Primary Cardiologist: Dr. [name]  Subjective   Feels better.  Drain clotted off and was opened.  Inpatient Medications    Scheduled Meds: . sodium chloride   Intravenous Once  . acetaminophen  1,000 mg Oral Q6H   Or  . acetaminophen (TYLENOL) oral liquid 160 mg/5 mL  1,000 mg Oral Q6H  . amiodarone  200 mg Oral Daily  . aspirin  325 mg Oral Daily  . bisacodyl  10 mg Oral Daily  . carvedilol  3.125 mg Oral BID WC  . colchicine  0.6 mg Oral BID  . furosemide  40 mg Oral BID  . glipiZIDE  10 mg Oral BID AC  . Influenza vac split quadrivalent PF  0.5 mL Intramuscular Tomorrow-1000  . insulin aspart  0-15 Units Subcutaneous TID WC  . metFORMIN  1,000 mg Oral BID WC  . pneumococcal 23 valent vaccine  0.5 mL Intramuscular Tomorrow-1000  . potassium chloride  20 mEq Oral Daily  . senna-docusate  1 tablet Oral QHS  . sodium chloride flush  3 mL Intravenous Q12H  . tamsulosin  0.4 mg Oral QPC supper  . Warfarin - Pharmacist Dosing Inpatient   Does not apply q1800   Continuous Infusions: . lactated ringers 10 mL/hr at 04/24/16 1338   PRN Meds: sodium chloride, acetaminophen, fentaNYL (SUBLIMAZE) injection, loperamide, magnesium hydroxide, nitroGLYCERIN, ondansetron (ZOFRAN) IV, oxyCODONE, potassium chloride (KCL MULTIRUN) 30 mEq in 265 mL IVPB, sodium chloride flush, traMADol   Vital Signs    Vitals:   04/24/16 2330 04/25/16 0415 04/25/16 0427 04/25/16 0900  BP:  (!) 152/75  140/77  Pulse:  66  76  Resp:  14  14  Temp: 97.9 F (36.6 C) 97.8 F (36.6 C)  98.6 F (37 C)  TempSrc: Oral Axillary  Oral  SpO2:  100%  90%  Weight:   206 lb 5.6 oz (93.6 kg)   Height:        Intake/Output Summary (Last 24 hours) at 04/25/16 1002 Last data filed at 04/25/16 0700  Gross per 24 hour  Intake             1084 ml  Output             1325 ml  Net             -241 ml   Filed Weights   04/23/16 0330  04/24/16 0400 04/25/16 0427  Weight: 213 lb 10 oz (96.9 kg) 214 lb 1.1 oz (97.1 kg) 206 lb 5.6 oz (93.6 kg)    Telemetry    NSR - Personally Reviewed  ECG    AFib on 2/9 - Personally Reviewed  Physical Exam   GEN: No acute distress.   Neck: No JVD Cardiac: RRR,  Respiratory: No wheezing GI: Soft, nontender, non-distended  MS: No edema; No deformity. Neuro:  Nonfocal  Psych: Normal affect   Bloody drainage in JP drain  Labs    Chemistry Recent Labs Lab 04/20/16 2331  04/22/16 0301 04/23/16 0156 04/24/16 0551  NA 132*  < > 135 134* 135  K 4.2  < > 4.4 4.4 3.8  CL 96*  < > 98* 97* 100*  CO2 24  < > 26 25 25   GLUCOSE 370*  < > 193* 192* 94  BUN 20  < > 19 24* 20  CREATININE 1.45*  < > 1.01 1.12 0.94  CALCIUM 8.9  < >  8.5* 8.6* 8.4*  PROT 6.6  --   --  5.9*  --   ALBUMIN 3.1*  --   --  2.7*  --   AST 25  --   --  22  --   ALT 20  --   --  21  --   ALKPHOS 90  --   --  88  --   BILITOT 0.5  --   --  0.6  --   GFRNONAA 48*  < > >60 >60 >60  GFRAA 56*  < > >60 >60 >60  ANIONGAP 12  < > 11 12 10   < > = values in this interval not displayed.   Hematology  Recent Labs Lab 04/23/16 0156 04/24/16 0551 04/25/16 0345  WBC 8.6 5.5 5.0  RBC 3.71* 3.69* 3.75*  HGB 9.7* 9.5* 9.6*  HCT 30.1* 30.2* 30.1*  MCV 81.1 81.8 80.3  MCH 26.1 25.7* 25.6*  MCHC 32.2 31.5 31.9  RDW 14.9 14.9 14.6  PLT 256 257 279    Cardiac Enzymes  Recent Labs Lab 04/18/16 2045  TROPONINI 0.15*   No results for input(s): TROPIPOC in the last 168 hours.   BNPNo results for input(s): BNP, PROBNP in the last 168 hours.   DDimer No results for input(s): DDIMER in the last 168 hours.   Radiology    Dg Chest Port 1 View  Result Date: 04/24/2016 CLINICAL DATA:  Pleural effusion. EXAM: PORTABLE CHEST 1 VIEW COMPARISON:  04/23/2016 . FINDINGS: Prior CABG. Cardiomegaly with normal pulmonary vascularity. Left lower lobe atelectasis and or infiltrate with small left pleural effusion. No  pneumothorax. IMPRESSION: 1.  Prior CABG.  Cardiomegaly with normal pulmonary vascularity. 2. Left lower lobe atelectasis and/or infiltrate with small left pleural effusion . No change from prior exam. Electronically Signed   By: Marcello Moores  Register   On: 04/24/2016 07:22    Cardiac Studies   Recent CABG  Patient Profile     66 y.o. male with CABG and now pericardial drain  Assessment & Plan    1) AFib:  Warfarin for stroke prevention.  Stopped heparin today- maybe this will decrease output.  Amio decreased.  Continue for 2-3 months.   2) Lasix increased due to drain output.  Heparin stopped.  Check limited echo tomorrow to look at fluid.  Signed, Larae Grooms, MD  04/25/2016, 10:02 AM

## 2016-04-25 NOTE — Progress Notes (Signed)
4 Days Post-Op Procedure(s) (LRB): SUBXYPHOID PERICARDIAL WINDOW (N/A) TRANSESOPHAGEAL ECHOCARDIOGRAM (TEE) (N/A) Subjective: No complaints  Objective: Vital signs in last 24 hours: Temp:  [97.8 F (36.6 C)-98.5 F (36.9 C)] 97.8 F (36.6 C) (02/13 0415) Pulse Rate:  [65-71] 66 (02/13 0415) Cardiac Rhythm: Atrial fibrillation (02/13 0415) Resp:  [14-17] 14 (02/13 0415) BP: (112-152)/(72-79) 152/75 (02/13 0415) SpO2:  [98 %-100 %] 100 % (02/13 0415) Weight:  [206 lb 5.6 oz (93.6 kg)] 206 lb 5.6 oz (93.6 kg) (02/13 0427)  Hemodynamic parameters for last 24 hours:    Intake/Output from previous day: 02/12 0701 - 02/13 0700 In: 1788 [P.O.:1320; I.V.:468] Out: 1630 [Urine:1400; Drains:230] Intake/Output this shift: No intake/output data recorded.  General appearance: alert, cooperative and no distress Heart: irregularly irregular rhythm blood clotted in drain   Lab Results:  Recent Labs  04/24/16 0551 04/25/16 0345  WBC 5.5 5.0  HGB 9.5* 9.6*  HCT 30.2* 30.1*  PLT 257 279   BMET:  Recent Labs  04/23/16 0156 04/24/16 0551  NA 134* 135  K 4.4 3.8  CL 97* 100*  CO2 25 25  GLUCOSE 192* 94  BUN 24* 20  CREATININE 1.12 0.94  CALCIUM 8.6* 8.4*    PT/INR: No results for input(s): LABPROT, INR in the last 72 hours. ABG    Component Value Date/Time   PHART 7.465 (H) 04/22/2016 0325   HCO3 26.8 04/22/2016 0325   TCO2 24 03/31/2016 1614   ACIDBASEDEF 3.0 (H) 03/30/2016 1850   O2SAT 94.7 04/22/2016 0325   CBG (last 3)   Recent Labs  04/24/16 1711 04/24/16 2331 04/25/16 0426  GLUCAP 70 59* 83    Assessment/Plan: S/P Procedure(s) (LRB): SUBXYPHOID PERICARDIAL WINDOW (N/A) TRANSESOPHAGEAL ECHOCARDIOGRAM (TEE) (N/A) -  Drainage from pericardial drain dropped dramatically overnight. On inspection the drain is clotted with blood. Previously had been serosanguinous (mostly serous). Drain stripped but unclear if it will be functional, follow DC heparin drip  for now   LOS: 9 days    [name] 04/25/2016

## 2016-04-25 NOTE — Progress Notes (Signed)
ANTICOAGULATION CONSULT NOTE - Follow Up Consult  Pharmacy Consult for heparin / Coumadin Indication: S/p AVR / atrial fibrillation   Labs:  Recent Labs  04/23/16 0156  04/24/16 0551 04/24/16 1244 04/25/16 0345 04/25/16 1120  HGB 9.7*  --  9.5*  --  9.6*  --   HCT 30.1*  --  30.2*  --  30.1*  --   PLT 256  --  257  --  279  --   LABPROT  --   --   --   --   --  14.2  INR  --   --   --   --   --  1.09  HEPARINUNFRC 0.17*  < > 0.31 0.41 0.53  --   CREATININE 1.12  --  0.94  --   --   --   < > = values in this interval not displayed.   Assessment: 66yo male s/p AVR (bioprosthetic) on 03/30/16 here for SOB and noted with afib. CHADSVasC = 4. Heparin restarted s/p pericardial window. Last heparin level remains therapeutic at 0.41. Now to transition to Coumadin. Last INR was 1.09 this am. Hgb low but stable at 9.6, plts wnl. On 2/13 heparin gtt stopped with blood clotted in the pericardial drain.   Goal of Therapy:  Heparin level 0.3-0.7 units/ml   Plan:  Heparin on hold Give Coumadin 5mg  PO x 1 tonight Monitor daily heparin level / INR and CBC  Elenor Quinones, PharmD, BCPS Clinical Pharmacist Pager [phone] 04/25/2016 12:17 PM

## 2016-04-26 LAB — GLUCOSE, CAPILLARY
GLUCOSE-CAPILLARY: 185 mg/dL — AB (ref 65–99)
GLUCOSE-CAPILLARY: 190 mg/dL — AB (ref 65–99)
GLUCOSE-CAPILLARY: 283 mg/dL — AB (ref 65–99)
Glucose-Capillary: 110 mg/dL — ABNORMAL HIGH (ref 65–99)
Glucose-Capillary: 146 mg/dL — ABNORMAL HIGH (ref 65–99)
Glucose-Capillary: 64 mg/dL — ABNORMAL LOW (ref 65–99)
Glucose-Capillary: 76 mg/dL (ref 65–99)

## 2016-04-26 LAB — CBC
HCT: 32.4 % — ABNORMAL LOW (ref 39.0–52.0)
Hemoglobin: 10.3 g/dL — ABNORMAL LOW (ref 13.0–17.0)
MCH: 25.7 pg — AB (ref 26.0–34.0)
MCHC: 31.8 g/dL (ref 30.0–36.0)
MCV: 80.8 fL (ref 78.0–100.0)
PLATELETS: 307 10*3/uL (ref 150–400)
RBC: 4.01 MIL/uL — ABNORMAL LOW (ref 4.22–5.81)
RDW: 14.5 % (ref 11.5–15.5)
WBC: 4.9 10*3/uL (ref 4.0–10.5)

## 2016-04-26 LAB — PROTIME-INR
INR: 1.09
PROTHROMBIN TIME: 14.2 s (ref 11.4–15.2)

## 2016-04-26 MED ORDER — PREDNISONE 10 MG (21) PO TBPK
10.0000 mg | ORAL_TABLET | Freq: Three times a day (TID) | ORAL | Status: AC
  Administered 2016-04-27 (×3): 10 mg via ORAL

## 2016-04-26 MED ORDER — PREDNISONE 10 MG (21) PO TBPK
10.0000 mg | ORAL_TABLET | Freq: Four times a day (QID) | ORAL | Status: DC
  Administered 2016-04-28 (×3): 10 mg via ORAL

## 2016-04-26 MED ORDER — GLUCOSE 40 % PO GEL
ORAL | Status: AC
  Administered 2016-04-26: 37.5 g
  Filled 2016-04-26: qty 1

## 2016-04-26 MED ORDER — PREDNISONE 10 MG (21) PO TBPK
10.0000 mg | ORAL_TABLET | ORAL | Status: AC
  Administered 2016-04-26: 10 mg via ORAL

## 2016-04-26 MED ORDER — WARFARIN SODIUM 5 MG PO TABS
7.5000 mg | ORAL_TABLET | Freq: Once | ORAL | Status: AC
  Administered 2016-04-26: 7.5 mg via ORAL
  Filled 2016-04-26: qty 2

## 2016-04-26 MED ORDER — PREDNISONE 10 MG (21) PO TBPK
20.0000 mg | ORAL_TABLET | Freq: Every evening | ORAL | Status: AC
  Administered 2016-04-27: 20 mg via ORAL

## 2016-04-26 MED ORDER — PREDNISONE 10 MG (21) PO TBPK
20.0000 mg | ORAL_TABLET | Freq: Every evening | ORAL | Status: AC
  Administered 2016-04-26: 20 mg via ORAL

## 2016-04-26 MED ORDER — PREDNISONE 10 MG (21) PO TBPK
20.0000 mg | ORAL_TABLET | Freq: Every morning | ORAL | Status: AC
  Administered 2016-04-26: 20 mg via ORAL
  Filled 2016-04-26: qty 21

## 2016-04-26 NOTE — Progress Notes (Signed)
HYPOGLYCEMIC EVENT  CBG: 64  TREATMENT: glucose gel   SYMPTOMS: sweating and light headed  FOLLOW-UP CBG:    Time:  0105 CBG Result:76  POSSIBLE REASONS FOR EVENT: insulin COMMENTS/MD NOTIFIED:

## 2016-04-26 NOTE — Care Management Note (Addendum)
Case Management Note  Patient Details  Name: Mark Cabrera MRN: [id number] Date of Birth: [birth year]  Subjective/Objective:  Patient lives in a barn, but he states he will be going home with his brother Mark Cabrera at discharge and that Mark Cabrera will provide transportation for him at discharge. [name]'s phone is [phone],  NCM spoke with Mark Cabrera and he states he has not heard from the patient yet , but he will definitely provide transportation for him at dc and that he could stay with him at dc.  Patient had [place] services with [facility]ings previously and would like to continue to work with them for [facility] for CHF.  Referral made to [place] will begin 24-48 hrs post dc.  Patient had a recent CABG 1/19, aifb now pod4 for pericardial window, was having very high output from jp drain, lasix was increased, heparin dc'd, now on coumadin, he will have the JP drain dc'd today, it only had 13ml last pm and he will have a limited echo today, and cxr tomorrow.  NCM will cont to follow for dc needs.     2/16 [place], BSN - patient is for possible dc today, he will have HHRN with [name] notified, he also need gulcometer and strips.  NCM called PA Delos Haring , left message with call service for her to return NCM call. NCM received call from [facility], she will write scripts for lancets, glucometer and strips, Patient will go to [employer] to get them and use his [employer].                 Action/Plan:   Expected Discharge Date:  04/19/16               Expected Discharge Plan:  [name]  In-House Referral:     Discharge planning Services  CM Consult  Post Acute Care Choice:    Choice offered to:  Patient  DME Arranged:    DME Agency:     HH Arranged:  RN, Disease Management [place] Agency:  [place]  Status of Service:  In process, will continue to follow  If discussed at Long Length of Stay Meetings, dates discussed:    Additional  Comments:  Zenon Mayo, RN 04/26/2016, 11:46 AM

## 2016-04-26 NOTE — Progress Notes (Signed)
[address]       [place] [address]             [phone]      5 Days Post-Op Procedure(s) (LRB): SUBXYPHOID PERICARDIAL WINDOW (N/A) TRANSESOPHAGEAL ECHOCARDIOGRAM (TEE) (N/A) Subjective: conts to feel better  Objective: Vital signs in last 24 hours: Temp:  [97.7 F (36.5 C)-98.6 F (37 C)] 98.5 F (36.9 C) (02/14 0401) Pulse Rate:  [72-77] 75 (02/14 0401) Cardiac Rhythm: Heart block;Normal sinus rhythm;Bundle branch block (02/14 0700) Resp:  [14-19] 17 (02/13 1624) BP: (125-140)/(72-82) 140/77 (02/14 0401) SpO2:  [90 %-99 %] 99 % (02/14 0401)  Hemodynamic parameters for last 24 hours:    Intake/Output from previous day: 02/13 0701 - 02/14 0700 In: 340 [P.O.:340] Out: 2585 [Urine:2500; Drains:85] Intake/Output this shift: No intake/output data recorded.  General appearance: alert, cooperative and no distress Heart: regular rate and rhythm Lungs: mildly dim in left base Abdomen: benign Extremities: no edema Wound: incies healing well  Lab Results:  Recent Labs  04/25/16 0345 04/26/16 0149  WBC 5.0 4.9  HGB 9.6* 10.3*  HCT 30.1* 32.4*  PLT 279 307   BMET:  Recent Labs  04/24/16 0551  NA 135  K 3.8  CL 100*  CO2 25  GLUCOSE 94  BUN 20  CREATININE 0.94  CALCIUM 8.4*    PT/INR:  Recent Labs  04/26/16 0149  LABPROT 14.2  INR 1.09   ABG    Component Value Date/Time   PHART 7.465 (H) 04/22/2016 0325   HCO3 26.8 04/22/2016 0325   TCO2 24 03/31/2016 1614   ACIDBASEDEF 3.0 (H) 03/30/2016 1850   O2SAT 94.7 04/22/2016 0325   CBG (last 3)   Recent Labs  04/26/16 0105 04/26/16 0358 04/26/16 0807  GLUCAP 76 146* 110*    Meds Scheduled Meds: . sodium chloride   Intravenous Once  . acetaminophen  1,000 mg Oral Q6H   Or  . acetaminophen (TYLENOL) oral liquid 160 mg/5 mL  1,000 mg Oral Q6H  . amiodarone  200 mg Oral Daily  . aspirin  325 mg Oral Daily  . bisacodyl  10 mg Oral Daily  . carvedilol  3.125 mg  Oral BID WC  . colchicine  0.6 mg Oral BID  . furosemide  40 mg Oral BID  . glipiZIDE  10 mg Oral BID AC  . Influenza vac split quadrivalent PF  0.5 mL Intramuscular Tomorrow-1000  . insulin aspart  0-15 Units Subcutaneous TID WC  . metFORMIN  1,000 mg Oral BID WC  . pneumococcal 23 valent vaccine  0.5 mL Intramuscular Tomorrow-1000  . potassium chloride  20 mEq Oral Daily  . senna-docusate  1 tablet Oral QHS  . sodium chloride flush  3 mL Intravenous Q12H  . tamsulosin  0.4 mg Oral QPC supper  . Warfarin - Pharmacist Dosing Inpatient   Does not apply q1800   Continuous Infusions: . lactated ringers 10 mL/hr at 04/24/16 1338   PRN Meds:.sodium chloride, acetaminophen, fentaNYL (SUBLIMAZE) injection, loperamide, magnesium hydroxide, nitroGLYCERIN, ondansetron (ZOFRAN) IV, oxyCODONE, potassium chloride (KCL MULTIRUN) 30 mEq in 265 mL IVPB, sodium chloride flush, traMADol  Xrays No results found.  Assessment/Plan: S/P Procedure(s) (LRB): SUBXYPHOID PERICARDIAL WINDOW (N/A) TRANSESOPHAGEAL ECHOCARDIOGRAM (TEE) (N/A)   1 drain conts to decrease, 87 ml yesterday, hopefully can d/c soon 2 echo did show some circumferential fluid on yesterdays echo and left pleural effusion, cont to monitor clinically 3 on coumadin  LOS: 10 days  Yailyn Strack E 04/26/2016

## 2016-04-26 NOTE — Progress Notes (Signed)
Progress Note  Patient Name: Mark Cabrera Date of Encounter: 04/26/2016  Primary Cardiologist: Dr. [name]  Subjective   Feels better.  Drain to be removed today.  Inpatient Medications    Scheduled Meds: . sodium chloride   Intravenous Once  . acetaminophen  1,000 mg Oral Q6H   Or  . acetaminophen (TYLENOL) oral liquid 160 mg/5 mL  1,000 mg Oral Q6H  . amiodarone  200 mg Oral Daily  . aspirin  325 mg Oral Daily  . bisacodyl  10 mg Oral Daily  . carvedilol  3.125 mg Oral BID WC  . colchicine  0.6 mg Oral BID  . furosemide  40 mg Oral BID  . glipiZIDE  10 mg Oral BID AC  . Influenza vac split quadrivalent PF  0.5 mL Intramuscular Tomorrow-1000  . insulin aspart  0-15 Units Subcutaneous TID WC  . metFORMIN  1,000 mg Oral BID WC  . pneumococcal 23 valent vaccine  0.5 mL Intramuscular Tomorrow-1000  . potassium chloride  20 mEq Oral Daily  . predniSONE  10 mg Oral PC supper  . [START ON 04/27/2016] predniSONE  10 mg Oral 3 x daily with food  . [START ON 04/28/2016] predniSONE  10 mg Oral 4X daily taper  . predniSONE  20 mg Oral Nightly  . [START ON 04/27/2016] predniSONE  20 mg Oral Nightly  . senna-docusate  1 tablet Oral QHS  . sodium chloride flush  3 mL Intravenous Q12H  . tamsulosin  0.4 mg Oral QPC supper  . warfarin  7.5 mg Oral ONCE-1800  . Warfarin - Pharmacist Dosing Inpatient   Does not apply q1800   Continuous Infusions: . lactated ringers 10 mL/hr at 04/24/16 1338   PRN Meds: sodium chloride, acetaminophen, fentaNYL (SUBLIMAZE) injection, loperamide, magnesium hydroxide, nitroGLYCERIN, ondansetron (ZOFRAN) IV, oxyCODONE, potassium chloride (KCL MULTIRUN) 30 mEq in 265 mL IVPB, sodium chloride flush, traMADol   Vital Signs    Vitals:   04/26/16 0012 04/26/16 0401 04/26/16 0700 04/26/16 1200  BP: 125/72 140/77 133/78 (!) 142/78  Pulse: 77 75 73 77  Resp:   17 19  Temp: 98.5 F (36.9 C) 98.5 F (36.9 C) 98.4 F (36.9 C) 97.9 F (36.6 C)  TempSrc:  Oral Oral Oral Oral  SpO2: 99% 99% 100% 98%  Weight:      Height:        Intake/Output Summary (Last 24 hours) at 04/26/16 1406 Last data filed at 04/26/16 1200  Gross per 24 hour  Intake              240 ml  Output             1885 ml  Net            -1645 ml   Filed Weights   04/23/16 0330 04/24/16 0400 04/25/16 0427  Weight: 213 lb 10 oz (96.9 kg) 214 lb 1.1 oz (97.1 kg) 206 lb 5.6 oz (93.6 kg)    Telemetry    NSR - Personally Reviewed  ECG    AFib on 2/9 - Personally Reviewed  Physical Exam   GEN: No acute distress.   Neck: No JVD Cardiac: RRR,  Respiratory: No wheezing GI: Soft, nontender, non-distended  MS: No edema; No deformity. Neuro:  Nonfocal  Psych: Normal affect   Bloody drainage in JP drain  Labs    Chemistry Recent Labs Lab 04/20/16 2331  04/22/16 0301 04/23/16 0156 04/24/16 0551  NA 132*  < > 135  134* 135  K 4.2  < > 4.4 4.4 3.8  CL 96*  < > 98* 97* 100*  CO2 24  < > 26 25 25   GLUCOSE 370*  < > 193* 192* 94  BUN 20  < > 19 24* 20  CREATININE 1.45*  < > 1.01 1.12 0.94  CALCIUM 8.9  < > 8.5* 8.6* 8.4*  PROT 6.6  --   --  5.9*  --   ALBUMIN 3.1*  --   --  2.7*  --   AST 25  --   --  22  --   ALT 20  --   --  21  --   ALKPHOS 90  --   --  88  --   BILITOT 0.5  --   --  0.6  --   GFRNONAA 48*  < > >60 >60 >60  GFRAA 56*  < > >60 >60 >60  ANIONGAP 12  < > 11 12 10   < > = values in this interval not displayed.   Hematology  Recent Labs Lab 04/24/16 0551 04/25/16 0345 04/26/16 0149  WBC 5.5 5.0 4.9  RBC 3.69* 3.75* 4.01*  HGB 9.5* 9.6* 10.3*  HCT 30.2* 30.1* 32.4*  MCV 81.8 80.3 80.8  MCH 25.7* 25.6* 25.7*  MCHC 31.5 31.9 31.8  RDW 14.9 14.6 14.5  PLT 257 279 307    Cardiac Enzymes No results for input(s): TROPONINI in the last 168 hours. No results for input(s): TROPIPOC in the last 168 hours.   BNPNo results for input(s): BNP, PROBNP in the last 168 hours.   DDimer No results for input(s): DDIMER in the last 168  hours.   Radiology    No results found.  Cardiac Studies   Recent CABG  Patient Profile     66 y.o. male with CABG and now pericardial drain  Assessment & Plan    1) AFib:  Warfarin for stroke prevention.  Stopped heparin today- maybe this will decrease output.  Amio decreased.  Continue for 2-3 months. Echo showed small circumferential effusion.  Bilateral pleural effusions, L>R.  Maintaining NSR.  2) Lasix increased due to drain output.  Heparin stopped.  Another round of steroids starting to decrease inflammation.   Signed, Larae Grooms, MD  04/26/2016, 2:06 PM

## 2016-04-26 NOTE — Progress Notes (Signed)
ANTICOAGULATION CONSULT NOTE - Follow Up Consult  Pharmacy Consult for heparin (on hold) and Coumadin Indication: S/p AVR / atrial fibrillation   Labs:  Recent Labs  04/24/16 0551 04/24/16 1244 04/25/16 0345 04/25/16 1120 04/26/16 0149  HGB 9.5*  --  9.6*  --  10.3*  HCT 30.2*  --  30.1*  --  32.4*  PLT 257  --  279  --  307  LABPROT  --   --   --  14.2 14.2  INR  --   --   --  1.09 1.09  HEPARINUNFRC 0.31 0.41 0.53  --   --   CREATININE 0.94  --   --   --   --      Assessment: 66yo male s/p AVR (bioprosthetic) on 03/30/16 here for SOB and noted with afib. CHADSVasC = 4. Heparin restarted s/p pericardial window but was stopped on 2/13 with blood clotted in the pericardial drain.  Noted plans for removal of pericardial drain today and possible restart heparin tomorrow.    In the interim pt was initiated on Coumadin.  INR remains at baseline (1.09) following two doses on Coumadin.  Hgb is stable.  Goal of Therapy:  Heparin level 0.3-0.7 units/ml  INR goal 2-3   Plan:  Heparin on hold Give Coumadin 7.5mg  PO x 1 tonight Monitor daily heparin level / INR and CBC  Mark Cabrera, Pharm.D., BCPS Clinical Pharmacist Pager [phone] 04/26/2016 9:52 AM

## 2016-04-26 NOTE — Progress Notes (Signed)
5 Days Post-Op Procedure(s) (LRB): SUBXYPHOID PERICARDIAL WINDOW (N/A) TRANSESOPHAGEAL ECHOCARDIOGRAM (TEE) (N/A) Subjective: No complaints, denies shortness of breath  Objective: Vital signs in last 24 hours: Temp:  [97.7 F (36.5 C)-98.6 F (37 C)] 98.5 F (36.9 C) (02/14 0401) Pulse Rate:  [72-77] 75 (02/14 0401) Cardiac Rhythm: Heart block;Normal sinus rhythm;Bundle branch block (02/14 0700) Resp:  [14-19] 17 (02/13 1624) BP: (125-140)/(72-82) 140/77 (02/14 0401) SpO2:  [90 %-99 %] 99 % (02/14 0401)  Hemodynamic parameters for last 24 hours:    Intake/Output from previous day: 02/13 0701 - 02/14 0700 In: 340 [P.O.:340] Out: 2585 [Urine:2500; Drains:85] Intake/Output this shift: No intake/output data recorded.  General appearance: alert, cooperative and no distress Neurologic: intact Heart: irregularly irregular rhythm Lungs: diminished breath sounds left base Wound: clean and dry  Lab Results:  Recent Labs  04/25/16 0345 04/26/16 0149  WBC 5.0 4.9  HGB 9.6* 10.3*  HCT 30.1* 32.4*  PLT 279 307   BMET:  Recent Labs  04/24/16 0551  NA 135  K 3.8  CL 100*  CO2 25  GLUCOSE 94  BUN 20  CREATININE 0.94  CALCIUM 8.4*    PT/INR:  Recent Labs  04/26/16 0149  LABPROT 14.2  INR 1.09   ABG    Component Value Date/Time   PHART 7.465 (H) 04/22/2016 0325   HCO3 26.8 04/22/2016 0325   TCO2 24 03/31/2016 1614   ACIDBASEDEF 3.0 (H) 03/30/2016 1850   O2SAT 94.7 04/22/2016 0325   CBG (last 3)   Recent Labs  04/26/16 0105 04/26/16 0358 04/26/16 0807  GLUCAP 76 146* 110*    Assessment/Plan: S/P Procedure(s) (LRB): SUBXYPHOID PERICARDIAL WINDOW (N/A) TRANSESOPHAGEAL ECHOCARDIOGRAM (TEE) (N/A) -  Pericardial effusion/ postpericardiotomy syndrome  Drainage has tapered off rapidly. Only a minimal amount of serous fluid in bulb  Stripped drain with no additional fluid or blood seen  Will dc pericardial drain today and observe  Will give another  round of steroids to hopefully prevent reaccumulation  Would wait another 24 hours before resuming heparin drip  Pleural effusion- lungs sound pretty good- recheck CXR in AM     LOS: 10 days    [name] 04/26/2016

## 2016-04-27 ENCOUNTER — Inpatient Hospital Stay (HOSPITAL_COMMUNITY): Payer: Medicare Other

## 2016-04-27 LAB — GLUCOSE, CAPILLARY
GLUCOSE-CAPILLARY: 133 mg/dL — AB (ref 65–99)
GLUCOSE-CAPILLARY: 205 mg/dL — AB (ref 65–99)
Glucose-Capillary: 171 mg/dL — ABNORMAL HIGH (ref 65–99)
Glucose-Capillary: 204 mg/dL — ABNORMAL HIGH (ref 65–99)

## 2016-04-27 LAB — CBC
HEMATOCRIT: 33.9 % — AB (ref 39.0–52.0)
HEMOGLOBIN: 11 g/dL — AB (ref 13.0–17.0)
MCH: 25.7 pg — ABNORMAL LOW (ref 26.0–34.0)
MCHC: 32.4 g/dL (ref 30.0–36.0)
MCV: 79.2 fL (ref 78.0–100.0)
Platelets: 322 10*3/uL (ref 150–400)
RBC: 4.28 MIL/uL (ref 4.22–5.81)
RDW: 14.6 % (ref 11.5–15.5)
WBC: 5.6 10*3/uL (ref 4.0–10.5)

## 2016-04-27 LAB — PROTIME-INR
INR: 1.3
Prothrombin Time: 16.3 seconds — ABNORMAL HIGH (ref 11.4–15.2)

## 2016-04-27 MED ORDER — WARFARIN SODIUM 5 MG PO TABS
5.0000 mg | ORAL_TABLET | Freq: Once | ORAL | Status: AC
  Administered 2016-04-27: 5 mg via ORAL
  Filled 2016-04-27: qty 1

## 2016-04-27 MED ORDER — ONDANSETRON HCL 4 MG PO TABS
4.0000 mg | ORAL_TABLET | Freq: Three times a day (TID) | ORAL | Status: DC | PRN
  Administered 2016-04-27: 4 mg via ORAL
  Filled 2016-04-27: qty 1

## 2016-04-27 NOTE — Progress Notes (Signed)
ANTICOAGULATION CONSULT NOTE - Follow Up Consult  Pharmacy Consult for heparin (on hold) and Coumadin Indication: S/p AVR / atrial fibrillation   Labs:  Recent Labs  04/25/16 0345 04/25/16 1120 04/26/16 0149 04/27/16 0352  HGB 9.6*  --  10.3* 11.0*  HCT 30.1*  --  32.4* 33.9*  PLT 279  --  307 322  LABPROT  --  14.2 14.2 16.3*  INR  --  1.09 1.09 1.30  HEPARINUNFRC 0.53  --   --   --      Assessment: 66yo male s/p AVR (bioprosthetic) on 03/30/16 here for SOB and noted with afib. CHADSVasC = 4. Heparin restarted s/p pericardial window. Last heparin level therapeutic at 0.41 before stopping due to blood clotted pericardial drain. Drain now removed.  Cards ok with holding off on restarting heparin. Now to transition to Coumadin. INR starting to trend up to 1.3 this am. Hgb stable at 11, plts wnl.   Goal of Therapy:  Heparin level 0.3-0.7 units/ml  INR goal 2-3   Plan:  Give Coumadin 5mg  PO x 1 tonight Monitor daily heparin level / INR and CBC  Elenor Quinones, PharmD, BCPS Clinical Pharmacist Pager [phone] 04/27/2016 3:53 PM

## 2016-04-27 NOTE — Progress Notes (Addendum)
[address]       [employer] [id number]             [phone]      6 Days Post-Op Procedure(s) (LRB): SUBXYPHOID PERICARDIAL WINDOW (N/A) TRANSESOPHAGEAL ECHOCARDIOGRAM (TEE) (N/A) Subjective: Feels much better today, no SOB  Objective: Vital signs in last 24 hours: Temp:  [97.9 F (36.6 C)-98.6 F (37 C)] 98.5 F (36.9 C) (02/15 0400) Pulse Rate:  [76-83] 78 (02/15 0400) Cardiac Rhythm: Normal sinus rhythm;Bundle branch block (02/15 0700) Resp:  [12-19] 12 (02/15 0400) BP: (129-144)/(75-81) 136/76 (02/15 0400) SpO2:  [97 %-98 %] 97 % (02/15 0400) Weight:  [206 lb 12 oz (93.8 kg)] 206 lb 12 oz (93.8 kg) (02/15 0500)  Hemodynamic parameters for last 24 hours:    Intake/Output from previous day: 02/14 0701 - 02/15 0700 In: -  Out: 1575 [Urine:1575] Intake/Output this shift: No intake/output data recorded.  General appearance: alert, cooperative and no distress Heart: regular rate and rhythm and no rub Lungs: min dim in bases Wound: incis healing well  Lab Results:  Recent Labs  04/26/16 0149 04/27/16 0352  WBC 4.9 5.6  HGB 10.3* 11.0*  HCT 32.4* 33.9*  PLT 307 322   BMET: No results for input(s): NA, K, CL, CO2, GLUCOSE, BUN, CREATININE, CALCIUM in the last 72 hours.  PT/INR:  Recent Labs  04/27/16 0352  LABPROT 16.3*  INR 1.30   ABG    Component Value Date/Time   PHART 7.465 (H) 04/22/2016 0325   HCO3 26.8 04/22/2016 0325   TCO2 24 03/31/2016 1614   ACIDBASEDEF 3.0 (H) 03/30/2016 1850   O2SAT 94.7 04/22/2016 0325   CBG (last 3)   Recent Labs  04/26/16 1754 04/26/16 2027 04/27/16 0812  GLUCAP 190* 283* 171*    Meds Scheduled Meds: . sodium chloride   Intravenous Once  . amiodarone  200 mg Oral Daily  . aspirin  325 mg Oral Daily  . bisacodyl  10 mg Oral Daily  . carvedilol  3.125 mg Oral BID WC  . colchicine  0.6 mg Oral BID  . furosemide  40 mg Oral BID  . glipiZIDE  10 mg Oral BID AC  . Influenza vac  split quadrivalent PF  0.5 mL Intramuscular Tomorrow-1000  . insulin aspart  0-15 Units Subcutaneous TID WC  . metFORMIN  1,000 mg Oral BID WC  . pneumococcal 23 valent vaccine  0.5 mL Intramuscular Tomorrow-1000  . potassium chloride  20 mEq Oral Daily  . predniSONE  10 mg Oral 3 x daily with food  . [START ON 04/28/2016] predniSONE  10 mg Oral 4X daily taper  . predniSONE  20 mg Oral Nightly  . senna-docusate  1 tablet Oral QHS  . sodium chloride flush  3 mL Intravenous Q12H  . tamsulosin  0.4 mg Oral QPC supper  . Warfarin - Pharmacist Dosing Inpatient   Does not apply q1800   Continuous Infusions: . lactated ringers 10 mL/hr at 04/24/16 1338   PRN Meds:.sodium chloride, acetaminophen, fentaNYL (SUBLIMAZE) injection, loperamide, magnesium hydroxide, nitroGLYCERIN, ondansetron (ZOFRAN) IV, oxyCODONE, potassium chloride (KCL MULTIRUN) 30 mEq in 265 mL IVPB, sodium chloride flush, traMADol  Xrays Dg Chest 2 View  Result Date: 04/27/2016 CLINICAL DATA:  Follow-up left pleural effusion. 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 EXAM: CHEST  2 VIEW COMPARISON:  Portable chest x-ray of April 24, 2016 FINDINGS: The small left pleural effusion has decreased slightly. There is no right pleural effusion. The lungs are adequately inflated. The heart is top-normal in size. The prosthetic aortic valve ring is visible as are the 2 coronary artery graft markers and left atrial appendage clip. The pulmonary vascularity is normal. The sternal wires are intact. There is calcification in the wall of the aortic arch. Pleural effusion on the left has decreased slightly in volume. IMPRESSION: There is been mild interval decrease in the volume of the left pleural effusion. No pulmonary edema. Electronically Signed   By: David  Martinique M.D.   On: 04/27/2016 07:27    Assessment/Plan: S/P Procedure(s) (LRB): SUBXYPHOID  PERICARDIAL WINDOW (N/A) TRANSESOPHAGEAL ECHOCARDIOGRAM (TEE) (N/A)   1stable from CV surgery perspective on current RX/TX   LOS: 11 days    GOLD,WAYNE E 04/27/2016 Patient seen and examined, agree with above  Remo Lipps C. Roxan Hockey, MD [facility] [phone]

## 2016-04-27 NOTE — Progress Notes (Signed)
Progress Note  Patient Name: Mark Cabrera Date of Encounter: 04/27/2016  Primary Cardiologist: Dr. [name]  Subjective   Pt very weak this AM and does not feel he can go home today. We discussed d/c tomorrow.  Will have pt walk with cardiac rehab.  Inpatient Medications    Scheduled Meds: . sodium chloride   Intravenous Once  . amiodarone  200 mg Oral Daily  . aspirin  325 mg Oral Daily  . bisacodyl  10 mg Oral Daily  . carvedilol  3.125 mg Oral BID WC  . colchicine  0.6 mg Oral BID  . furosemide  40 mg Oral BID  . glipiZIDE  10 mg Oral BID AC  . Influenza vac split quadrivalent PF  0.5 mL Intramuscular Tomorrow-1000  . insulin aspart  0-15 Units Subcutaneous TID WC  . metFORMIN  1,000 mg Oral BID WC  . pneumococcal 23 valent vaccine  0.5 mL Intramuscular Tomorrow-1000  . potassium chloride  20 mEq Oral Daily  . predniSONE  10 mg Oral 3 x daily with food  . [START ON 04/28/2016] predniSONE  10 mg Oral 4X daily taper  . predniSONE  20 mg Oral Nightly  . senna-docusate  1 tablet Oral QHS  . sodium chloride flush  3 mL Intravenous Q12H  . tamsulosin  0.4 mg Oral QPC supper  . warfarin  5 mg Oral ONCE-1800  . Warfarin - Pharmacist Dosing Inpatient   Does not apply q1800   Continuous Infusions: . lactated ringers 10 mL/hr at 04/24/16 1338   PRN Meds: sodium chloride, acetaminophen, fentaNYL (SUBLIMAZE) injection, loperamide, magnesium hydroxide, nitroGLYCERIN, ondansetron (ZOFRAN) IV, ondansetron, oxyCODONE, potassium chloride (KCL MULTIRUN) 30 mEq in 265 mL IVPB, sodium chloride flush, traMADol   Vital Signs    Vitals:   04/27/16 0400 04/27/16 0500 04/27/16 0815 04/27/16 1200  BP: 136/76  133/87   Pulse: 78  80   Resp: 12  14   Temp: 98.5 F (36.9 C)  98.2 F (36.8 C) 98 F (36.7 C)  TempSrc: Oral  Oral Oral  SpO2: 97%  99%   Weight:  206 lb 12 oz (93.8 kg)    Height:        Intake/Output Summary (Last 24 hours) at 04/27/16 1454 Last data filed at 04/27/16  0400  Gross per 24 hour  Intake                0 ml  Output             1050 ml  Net            -1050 ml   Filed Weights   04/24/16 0400 04/25/16 0427 04/27/16 0500  Weight: 214 lb 1.1 oz (97.1 kg) 206 lb 5.6 oz (93.6 kg) 206 lb 12 oz (93.8 kg)    Telemetry    SR - Personally Reviewed  ECG    No new since the 9th - Personally Reviewed  Physical Exam   Per Dr. Irish Lack GEN: No acute distress.   Neck: No JVD Cardiac: RRR, no murmurs, rubs, or gallops.  Respiratory: diminished in bases otherwise clear to auscultation bilaterally. GI: Soft, nontender, non-distended  MS: No edema; No deformity. Neuro:  Nonfocal  Psych: Normal affect   Labs    Chemistry Recent Labs Lab 04/20/16 2331  04/22/16 0301 04/23/16 0156 04/24/16 0551  NA 132*  < > 135 134* 135  K 4.2  < > 4.4 4.4 3.8  CL 96*  < >  98* 97* 100*  CO2 24  < > 26 25 25   GLUCOSE 370*  < > 193* 192* 94  Mark 20  < > 19 24* 20  CREATININE 1.45*  < > 1.01 1.12 0.94  CALCIUM 8.9  < > 8.5* 8.6* 8.4*  PROT 6.6  --   --  5.9*  --   ALBUMIN 3.1*  --   --  2.7*  --   AST 25  --   --  22  --   ALT 20  --   --  21  --   ALKPHOS 90  --   --  88  --   BILITOT 0.5  --   --  0.6  --   GFRNONAA 48*  < > >60 >60 >60  GFRAA 56*  < > >60 >60 >60  ANIONGAP 12  < > 11 12 10   < > = values in this interval not displayed.   Hematology Recent Labs Lab 04/25/16 0345 04/26/16 0149 04/27/16 0352  WBC 5.0 4.9 5.6  RBC 3.75* 4.01* 4.28  HGB 9.6* 10.3* 11.0*  HCT 30.1* 32.4* 33.9*  MCV 80.3 80.8 79.2  MCH 25.6* 25.7* 25.7*  MCHC 31.9 31.8 32.4  RDW 14.6 14.5 14.6  PLT 279 307 322    Cardiac EnzymesNo results for input(s): TROPONINI in the last 168 hours. No results for input(s): TROPIPOC in the last 168 hours.   BNPNo results for input(s): BNP, PROBNP in the last 168 hours.   DDimer No results for input(s): DDIMER in the last 168 hours.   Radiology    Dg Chest 2 View  Result Date: 04/27/2016 CLINICAL DATA:  Follow-up  left pleural effusion. 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 EXAM: CHEST  2 VIEW COMPARISON:  Portable chest x-ray of April 24, 2016 FINDINGS: The small left pleural effusion has decreased slightly. There is no right pleural effusion. The lungs are adequately inflated. The heart is top-normal in size. The prosthetic aortic valve ring is visible as are the 2 coronary artery graft markers and left atrial appendage clip. The pulmonary vascularity is normal. The sternal wires are intact. There is calcification in the wall of the aortic arch. Pleural effusion on the left has decreased slightly in volume. IMPRESSION: There is been mild interval decrease in the volume of the left pleural effusion. No pulmonary edema. Electronically Signed   By: David  Martinique M.D.   On: 04/27/2016 07:27    Cardiac Studies   Echo:  04/25/16 Study Conclusions  - Left ventricle: The cavity size was moderately dilated. There was   moderate concentric hypertrophy. Systolic function was normal.   The estimated ejection fraction was in the range of 50% to 55%.   Wall motion was normal; there were no regional wall motion   abnormalities. - Aortic valve: A bovine bioprosthesis was present. Mean gradient   (S): 9 mm Hg. - Mitral valve: There was mild regurgitation. - Left atrium: The atrium was severely dilated. Anterior-posterior   dimension: 56 mm. - Tricuspid valve: There was trivial regurgitation. - Pericardium, extracardiac: A small, free-flowing pericardial   effusion was identified circumferential to the heart. The fluid   had no internal echoes. There was a right pleural effusion. There   was a large left pleural effusion.  Patient Profile     66 y.o. male 66 year old male had aortic valve replacement a 23 mm Edwards  magna ease bovine pericardial valve, left atrial appendage clipping and vein grafts to the OM1 and  posterior descending artery on January 19.  He was noted to have atrial fibrillation in the holding area and had some postoperative atrial fibrillation following surgery.  He was in sinus rhythm on discharge.  Admitted 04/16/16 with increased edema and SOB, a fib and found pericardial effusion.  Pt had pericardial window.  Assessment & Plan    1) AFib:  Warfarin for stroke prevention.  Stopped heparin -   Amio decreased.pericardial drain removed.   Continue for 2-3 months. Echo showed small circumferential effusion.  Bilateral pleural effusions, L>R.  Maintaining NSR.  2) pericardial effusion  Post op day 6 for subxyphoid pericardial window post op day 6.  Drain is out  3) recent CABG and AVR.  4)  DM-2  On SSI stable on glipizide and metformin continue.   Pt lives in a [place] by barn with no running water or electricity.  Will go home with brother for now.     Signed, Cecilie Kicks, NP  04/27/2016, 2:54 PM    I have examined the patient and reviewed assessment and plan and discussed with patient.  Agree with above as stated.  Breathing better.  Still does not feel ready to go home.  He will go home with his brother and stay with them upon discharge.  Feels that he can take better deep breaths as time goes on.  Consider limited echo after steroid taper is done.  Aggressive RF modification for CAD as well.   Larae Grooms

## 2016-04-27 NOTE — Care Management Important Message (Signed)
Important Message  Patient Details  Name: Mark Cabrera MRN: [id number] Date of Birth: 23-Jan-1951   Medicare Important Message Given:  Yes    Lynzee Lindquist Abena 04/27/2016, 12:00 PM

## 2016-04-27 NOTE — Progress Notes (Signed)
Patient expressed worry and concern about discharging home. He doesn't feel like he is ready and reported that he thinks he went home too soon last admission. Patient also shared that he won't have a refrigerator when he is discharged and wants to make sure he isn't put on any medications that would need to be refrigerated. Patient stated that he would not use insulin if discharged with it. Patient has been taking his metformin and glipizide and reports he will be compliant with those. Patient's plan is to discharge and stay with his brother for a very short time.

## 2016-04-28 DIAGNOSIS — I3139 Other pericardial effusion (noninflammatory): Secondary | ICD-10-CM | POA: Diagnosis present

## 2016-04-28 DIAGNOSIS — J90 Pleural effusion, not elsewhere classified: Secondary | ICD-10-CM | POA: Diagnosis present

## 2016-04-28 DIAGNOSIS — E785 Hyperlipidemia, unspecified: Secondary | ICD-10-CM

## 2016-04-28 DIAGNOSIS — I313 Pericardial effusion (noninflammatory): Secondary | ICD-10-CM | POA: Diagnosis present

## 2016-04-28 DIAGNOSIS — Z952 Presence of prosthetic heart valve: Secondary | ICD-10-CM

## 2016-04-28 HISTORY — DX: Pericardial effusion (noninflammatory): I31.3

## 2016-04-28 HISTORY — DX: Other pericardial effusion (noninflammatory): I31.39

## 2016-04-28 LAB — CBC
HCT: 33.8 % — ABNORMAL LOW (ref 39.0–52.0)
Hemoglobin: 10.9 g/dL — ABNORMAL LOW (ref 13.0–17.0)
MCH: 25.6 pg — ABNORMAL LOW (ref 26.0–34.0)
MCHC: 32.2 g/dL (ref 30.0–36.0)
MCV: 79.5 fL (ref 78.0–100.0)
PLATELETS: 327 10*3/uL (ref 150–400)
RBC: 4.25 MIL/uL (ref 4.22–5.81)
RDW: 14.6 % (ref 11.5–15.5)
WBC: 7.6 10*3/uL (ref 4.0–10.5)

## 2016-04-28 LAB — BASIC METABOLIC PANEL
ANION GAP: 14 (ref 5–15)
BUN: 22 mg/dL — ABNORMAL HIGH (ref 6–20)
CHLORIDE: 97 mmol/L — AB (ref 101–111)
CO2: 23 mmol/L (ref 22–32)
CREATININE: 1.04 mg/dL (ref 0.61–1.24)
Calcium: 9.2 mg/dL (ref 8.9–10.3)
GFR calc non Af Amer: >60 mL/min (ref >60)
Glucose, Bld: 150 mg/dL — ABNORMAL HIGH (ref 65–99)
Potassium: 4 mmol/L (ref 3.5–5.1)
Sodium: 134 mmol/L — ABNORMAL LOW (ref 135–145)

## 2016-04-28 LAB — GLUCOSE, CAPILLARY
GLUCOSE-CAPILLARY: 159 mg/dL — AB (ref 65–99)
Glucose-Capillary: 213 mg/dL — ABNORMAL HIGH (ref 65–99)
Glucose-Capillary: 141 mg/dL — ABNORMAL HIGH (ref 65–99)

## 2016-04-28 LAB — PROTIME-INR
INR: 1.57
Prothrombin Time: 18.9 seconds — ABNORMAL HIGH (ref 11.4–15.2)

## 2016-04-28 MED ORDER — PREDNISONE 10 MG (21) PO TBPK: 10.0000 mg | ORAL_TABLET | Freq: Four times a day (QID) | ORAL | 0 refills | Status: DC

## 2016-04-28 MED ORDER — FUROSEMIDE 40 MG PO TABS: 40.0000 mg | ORAL_TABLET | Freq: Two times a day (BID) | ORAL | 6 refills | Status: DC

## 2016-04-28 MED ORDER — POTASSIUM CHLORIDE CRYS ER 20 MEQ PO TBCR: 20.0000 meq | EXTENDED_RELEASE_TABLET | Freq: Every day | ORAL | 6 refills | Status: DC

## 2016-04-28 MED ORDER — WARFARIN SODIUM 5 MG PO TABS: 5.0000 mg | ORAL_TABLET | Freq: Every day | ORAL | 6 refills | Status: DC

## 2016-04-28 MED ORDER — ONETOUCH ULTRASOFT LANCETS MISC: 12 refills | Status: DC

## 2016-04-28 MED ORDER — COLCHICINE 0.6 MG PO TABS: 0.6000 mg | ORAL_TABLET | Freq: Two times a day (BID) | ORAL | 6 refills | Status: DC

## 2016-04-28 MED ORDER — GLUCOSE BLOOD VI STRP: ORAL_STRIP | 12 refills | Status: DC

## 2016-04-28 MED ORDER — NITROGLYCERIN 0.4 MG SL SUBL: 0.4000 mg | SUBLINGUAL_TABLET | SUBLINGUAL | 1 refills | Status: DC | PRN

## 2016-04-28 MED ORDER — GLIPIZIDE 10 MG PO TABS: 10.0000 mg | ORAL_TABLET | Freq: Two times a day (BID) | ORAL | 6 refills | Status: DC

## 2016-04-28 MED ORDER — TRAMADOL HCL 50 MG PO TABS: 50.0000 mg | ORAL_TABLET | Freq: Four times a day (QID) | ORAL | 0 refills | Status: DC | PRN

## 2016-04-28 MED ORDER — ACETAMINOPHEN 325 MG PO TABS: 650.0000 mg | ORAL_TABLET | ORAL | Status: DC | PRN

## 2016-04-28 MED ORDER — AMIODARONE HCL 200 MG PO TABS: 200.0000 mg | ORAL_TABLET | Freq: Every day | ORAL | 6 refills | Status: DC

## 2016-04-28 MED ORDER — SENNOSIDES-DOCUSATE SODIUM 8.6-50 MG PO TABS: 1.0000 | ORAL_TABLET | Freq: Every day | ORAL | 6 refills | Status: DC

## 2016-04-28 MED ORDER — WARFARIN SODIUM 5 MG PO TABS
5.0000 mg | ORAL_TABLET | Freq: Once | ORAL | Status: AC
  Administered 2016-04-28: 5 mg via ORAL
  Filled 2016-04-28: qty 1

## 2016-04-28 NOTE — Progress Notes (Signed)
Discharge instructions given to patient, all questions answered at this time.  Prescription given to patient.  Pt. VSS with no s/s of distress noted.  Patient stable at discharge.   

## 2016-04-28 NOTE — Progress Notes (Addendum)
Progress Note  Patient Name: Mark Cabrera Date of Encounter: 04/28/2016  Primary Cardiologist: [name]  Subjective   Feels much better. Can take a much deeper breath.  Inpatient Medications    Scheduled Meds: . sodium chloride   Intravenous Once  . amiodarone  200 mg Oral Daily  . aspirin  325 mg Oral Daily  . bisacodyl  10 mg Oral Daily  . carvedilol  3.125 mg Oral BID WC  . colchicine  0.6 mg Oral BID  . furosemide  40 mg Oral BID  . glipiZIDE  10 mg Oral BID AC  . Influenza vac split quadrivalent PF  0.5 mL Intramuscular Tomorrow-1000  . insulin aspart  0-15 Units Subcutaneous TID WC  . metFORMIN  1,000 mg Oral BID WC  . pneumococcal 23 valent vaccine  0.5 mL Intramuscular Tomorrow-1000  . potassium chloride  20 mEq Oral Daily  . predniSONE  10 mg Oral 4X daily taper  . senna-docusate  1 tablet Oral QHS  . sodium chloride flush  3 mL Intravenous Q12H  . tamsulosin  0.4 mg Oral QPC supper  . Warfarin - Pharmacist Dosing Inpatient   Does not apply q1800   Continuous Infusions: . lactated ringers 10 mL/hr at 04/24/16 1338   PRN Meds: sodium chloride, acetaminophen, fentaNYL (SUBLIMAZE) injection, loperamide, magnesium hydroxide, nitroGLYCERIN, ondansetron (ZOFRAN) IV, ondansetron, oxyCODONE, potassium chloride (KCL MULTIRUN) 30 mEq in 265 mL IVPB, sodium chloride flush, traMADol   Vital Signs    Vitals:   04/28/16 0007 04/28/16 0353 04/28/16 0355 04/28/16 0720  BP: 129/75 135/77    Pulse: 86 87    Resp: 18 13    Temp: 98.4 F (36.9 C)  98.1 F (36.7 C) 97.5 F (36.4 C)  TempSrc: Oral  Oral Oral  SpO2: 99% 99%    Weight:   201 lb 15.1 oz (91.6 kg)   Height:        Intake/Output Summary (Last 24 hours) at 04/28/16 0939 Last data filed at 04/28/16 0645  Gross per 24 hour  Intake                0 ml  Output             1250 ml  Net            -1250 ml   Filed Weights   04/25/16 0427 04/27/16 0500 04/28/16 0355  Weight: 206 lb 5.6 oz (93.6 kg) 206 lb  12 oz (93.8 kg) 201 lb 15.1 oz (91.6 kg)    Telemetry    NSR - Personally Reviewed  ECG     - Personally Reviewed  Physical Exam   GEN: No acute distress.   Neck: No JVD Cardiac: RRR, 2/6 systolic murmur, well healing sites from surgery.  Respiratory: Clear to auscultation bilaterally. GI: Soft, nontender, non-distended  MS: No edema; No deformity. Neuro:  Nonfocal  Psych: Normal affect   Labs    Chemistry Recent Labs Lab 04/22/16 0301 04/23/16 0156 04/24/16 0551  NA 135 134* 135  K 4.4 4.4 3.8  CL 98* 97* 100*  CO2 26 25 25   GLUCOSE 193* 192* 94  BUN 19 24* 20  CREATININE 1.01 1.12 0.94  CALCIUM 8.5* 8.6* 8.4*  PROT  --  5.9*  --   ALBUMIN  --  2.7*  --   AST  --  22  --   ALT  --  21  --   ALKPHOS  --  88  --  BILITOT  --  0.6  --   GFRNONAA >60 >60 >60  GFRAA >60 >60 >60  ANIONGAP 11 12 10      Hematology Recent Labs Lab 04/26/16 0149 04/27/16 0352 04/28/16 0401  WBC 4.9 5.6 7.6  RBC 4.01* 4.28 4.25  HGB 10.3* 11.0* 10.9*  HCT 32.4* 33.9* 33.8*  MCV 80.8 79.2 79.5  MCH 25.7* 25.7* 25.6*  MCHC 31.8 32.4 32.2  RDW 14.5 14.6 14.6  PLT 307 322 327    Cardiac EnzymesNo results for input(s): TROPONINI in the last 168 hours. No results for input(s): TROPIPOC in the last 168 hours.   BNPNo results for input(s): BNP, PROBNP in the last 168 hours.   DDimer No results for input(s): DDIMER in the last 168 hours.   Radiology    Dg Chest 2 View  Result Date: 04/27/2016 CLINICAL DATA:  Follow-up left pleural effusion. 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 EXAM: CHEST  2 VIEW COMPARISON:  Portable chest x-ray of April 24, 2016 FINDINGS: The small left pleural effusion has decreased slightly. There is no right pleural effusion. The lungs are adequately inflated. The heart is top-normal in size. The prosthetic aortic valve ring is visible as are the 2  coronary artery graft markers and left atrial appendage clip. The pulmonary vascularity is normal. The sternal wires are intact. There is calcification in the wall of the aortic arch. Pleural effusion on the left has decreased slightly in volume. IMPRESSION: There is been mild interval decrease in the volume of the left pleural effusion. No pulmonary edema. Electronically Signed   By: David  Martinique M.D.   On: 04/27/2016 07:27    Cardiac Studies   Small effusion by last echo  Patient Profile     66 y.o. male with PAF.  Assessment & Plan    1) Pericardial effusion improved.  COntinue steroid taper.  2) Resume home health PT.  3)He will need INR check in the office next week.  4) BMet pending today.  WOuld send home on some Lasix given that he has had some fluid accumulation; 40-80 mg daily based on BMet.  Signed, Larae Grooms, MD  04/28/2016, 9:39 AM

## 2016-04-28 NOTE — Discharge Summary (Signed)
Discharge Summary    Patient ID: Mark Cabrera,  MRN: [id number], DOB/AGE: 07/25/1950 66 y.o.  Admit date: 04/16/2016 Discharge date: 04/28/2016  Primary Care Provider: [name] Kerbs Primary Cardiologist: Dr. 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 subxiphoid pericardial window on 04/21/2016   Pleural effusion on left, s/p throacentesis 04/18/16  Allergies Allergies  Allergen Reactions  . Crab (Diagnostic) Anaphylaxis  . Penicillins Other (See Comments)    UNSPECIFIED REACTIONS  Has patient had a PCN reaction causing immediate rash, facial/tongue/throat swelling, SOB or lightheadedness with hypotension: Unk Has patient had a PCN reaction causing severe rash involving mucus membranes or skin necrosis: Unk Has patient had a PCN reaction that required hospitalization: Unk Has patient had a PCN reaction occurring within the last 10 years: No If all of the above answers are "NO", then may proceed with Cephalosporin use.   . Statins     UNSPECIFIED REACTION   . Morphine And Related Other (See Comments)    hallucinations     History of Present Illness     66 y.o.malewith history of AVR 03/31/16 with bovine pericardial valve, left atrial appendage clipping and bypass with SVG to OM1 and SVG to PDA per Dr. Roxan Hockey. He did have atrial fib post-op but was in sinus at time of discharge. He is now readmitted with volume overload and recurrent atrial fibrillation.   Hospital Course     Consultants: [facility]  Pt readmitted to the hospital on  04/16/2016 for volume overload and recurrent atrial fibrillation. Two weeks prior he had CABG and aortic valve replacement.  He was given IV lasix, diltiazem and amiodarone which returned patient to sinus rhythm, plan for long term anti-coagulation.  An echo was done on 04/17/16 which showed a large left pleural effusion and pericardial effusion. Thoracentesis was done on 04/18/16 with 1.4L of fluid removed.He was noted to have post op anemia at 7.5 on 04/18/16, 1 unit pRBCs transfused.  Steroid taper and cholchicine did not ease subscapular chest pain, limited echo repeated on 04/20/2016 showing worsening of the pericardial effusion. CTS were concerned for potential tamponade and took him to OR on 04/21/2016 for subxiphoid pericardial window and transesophageal echocardiogram as surgical intervention, 500 ml serous fluid evacuated. His lasix was increased due to amount of fluid coming from drain. Repeat echo done on 04/25/2016 showed circumferential fluid to pericardium and bilateral pleural effusion L > R. Drain was removed on 04/26/2016. He felt weak on 04/27/2016 and therefore he was kept in the hospital an extra day and cardiac rehab was asked to ambulate patient. Dr. Irish Lack saw patient today, he is feeling much better today and can take a much deeper breath.    All follow-up appointments have been scheduled  -  CTS office to make appt to see pt for f/u chest xray .   -  Coumadin for home 5 mg daily per PharmD recommend INR recheck Feb 19, Monday at 9 am at office at [facility]. -  F/u cardiology appt Monday  March 2 at 1:30pm @ [address] with Cecilie Kicks, PA-C -  Care Management Team setting up home health visits. Discharge medications are listed below.  _____________  Discharge Vitals Blood pressure 129/72, pulse 77, temperature 97.6 F (36.4 C), temperature source Oral, resp. rate 14, height 5\' 8"  (1.727 m), weight 201 lb 15.1 oz (91.6 kg), SpO2 100 %.  Filed Weights   04/25/16 0427 04/27/16 0500  04/28/16 0355  Weight: 206 lb 5.6 oz (93.6 kg) 206 lb 12 oz (93.8 kg) 201 lb 15.1 oz (91.6 kg)    Labs & Radiologic Studies     CBC  Recent Labs  04/27/16 0352 04/28/16 0401  WBC 5.6 7.6  HGB 11.0* 10.9*  HCT 33.9* 33.8*  MCV 79.2 79.5  PLT 322 327     Dg Chest 1 View  Result Date: 04/18/2016 CLINICAL DATA:  Post left thoracentesis EXAM: CHEST 1 VIEW COMPARISON:  04/16/2016 FINDINGS: Prior valve replacement. Elevation of the left hemidiaphragm with left base atelectasis and small left effusion. No pneumothorax following thoracentesis. Mild cardiomegaly and vascular congestion. IMPRESSION: No pneumothorax following left thoracentesis. Electronically Signed   By: Rolm Baptise M.D.   On: 04/18/2016 16:25   Dg Chest 2 View  Result Date: 04/27/2016 CLINICAL DATA:  Follow-up left pleural effusion. 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 EXAM: CHEST  2 VIEW COMPARISON:  Portable chest x-ray of April 24, 2016 FINDINGS: The small left pleural effusion has decreased slightly. There is no right pleural effusion. The lungs are adequately inflated. The heart is top-normal in size. The prosthetic aortic valve ring is visible as are the 2 coronary artery graft markers and left atrial appendage clip. The pulmonary vascularity is normal. The sternal wires are intact. There is calcification in the wall of the aortic arch. Pleural effusion on the left has decreased slightly in volume. IMPRESSION: There is been mild interval decrease in the volume of the left pleural effusion. No pulmonary edema. Electronically Signed   By: David  Martinique M.D.   On: 04/27/2016 07:27   Dg Chest 2 View  Result Date: 04/20/2016 CLINICAL DATA:  Shortness of breath, underwent CABG, aortic valve replacement, and left atrial appendage clipping on March 30, 2016. EXAM: CHEST  2 VIEW COMPARISON:  Portable chest x-ray of April 18, 2016 FINDINGS: The right lung is well-expanded and clear. The left hemidiaphragm remains obscured. There is slight improved aeration at the left lung base. The cardiac silhouette remains enlarged. The pulmonary vascularity is not engorged. The sternal wires are intact. The left atrial appendage clip and the aortic valve ring appear to be in stable position. The observed bony structures are unremarkable. IMPRESSION: Slightly decreased volume of the left pleural effusion and left basilar atelectasis. Stable cardiomegaly without pulmonary edema. Electronically Signed   By: David  Martinique M.D.   On: 04/20/2016 14:08   Dg Chest 2 View  Result Date: 04/16/2016 CLINICAL DATA:  Dyspnea EXAM: CHEST  2 VIEW COMPARISON:  04/05/2016 chest radiograph. FINDINGS: Stable configuration of median sternotomy wires and aortic valve prosthesis. Stable cardiomediastinal silhouette with mild cardiomegaly. No pneumothorax. Moderate left pleural effusion is increased. No right pleural effusion. No pulmonary edema. Increased left lung base opacity. IMPRESSION: 1. Moderate left pleural effusion, increased. 2. Increased left lung base opacity, favor compressive atelectasis. 3. Stable mild cardiomegaly without pulmonary edema. Electronically Signed   By: Ilona Sorrel M.D.   On: 04/16/2016  18:11   Dg Chest 2 View  Result Date: 04/05/2016 CLINICAL DATA:  Shortness of breath and weakness. Postop day 6 status post CABG. EXAM: CHEST  2 VIEW COMPARISON:  04/02/2016 FINDINGS: Aortic valve prosthesis. Prior CABG. Epicardial pacer leads noted. Left atrial appendage clip observed. No sternal wire migration. Atherosclerotic calcification of the aortic arch. Continued fairly dense retrocardiac airspace opacity with obscuration left hemidiaphragm. Mildly improved atelectasis in The left upper lobe. The right lung remains clear. No overt edema although there is some mild blunting of the right posterior costophrenic angle suggesting a trace right  pleural effusion. IMPRESSION: 1. Continued retrocardiac airspace opacity on the left common not appreciably changed. This could be from atelectasis, pneumonia, and/ or left pleural effusion. However, the left upper lobe bandlike atelectasis has improved. 2. The right lung remains clear. 3. Postoperative findings from CABG, aortic valve repair, and left atrial appendage clipping. Epicardial pacer leads noted. 4. Trace right pleural effusion. Electronically Signed   By: Van Clines M.D.   On: 04/05/2016 16:29   Dg Chest 2 View  Result Date: 04/02/2016 CLINICAL DATA:  Atelectasis EXAM: CHEST  2 VIEW COMPARISON:  April 01, 2016 FINDINGS: The right chest tube has been removed. No pneumothorax. Increasing opacity and probable small effusion in the left base. Opacity in the left upper lobe is likely atelectasis, not significantly changed. No other interval changes. IMPRESSION: 1. Removal of right chest tube with no pneumothorax. 2. Increased small effusion and underlying opacity in the left base. 3. Opacity in the medial left upper lobe may be atelectasis. Recommend attention on follow-up. Electronically Signed   By: Dorise Bullion III M.D   On: 04/02/2016 08:50   Dg Chest Port 1 View  Result Date: 04/24/2016 CLINICAL DATA:  Pleural effusion. EXAM: PORTABLE CHEST 1 VIEW COMPARISON:  04/23/2016 . FINDINGS: Prior CABG. Cardiomegaly with normal pulmonary vascularity. Left lower lobe atelectasis and or infiltrate with small left pleural effusion. No pneumothorax. IMPRESSION: 1.  Prior CABG.  Cardiomegaly with normal pulmonary vascularity. 2. Left lower lobe atelectasis and/or infiltrate with small left pleural effusion . No change from prior exam. Electronically Signed   By: Marcello Moores  Register   On: 04/24/2016 07:22   Dg Chest Port 1 View  Result Date: 04/23/2016 CLINICAL DATA:  atelectasis EXAM: PORTABLE CHEST 1 VIEW COMPARISON:  Radiograph 04/22/2016 FINDINGS: Sternotomy wires overlie normal cardiac  silhouette. Atrial clip noted. There is dense LEFT basilar atelectasis slightly improved compared to prior. Upper lungs are clear. No pneumothorax. IMPRESSION: Improved LEFT lower lobe atelectasis Electronically Signed   By: Suzy Bouchard M.D.   On: 04/23/2016 07:39   Dg Chest Port 1 View  Result Date: 04/22/2016 CLINICAL DATA:  Pericardial effusion EXAM: PORTABLE CHEST 1 VIEW COMPARISON:  04/21/2016 FINDINGS: Cardiomegaly. Prior valve replacement. Left lower lobe atelectasis or infiltrate with small left effusion. Aeration at the left base slightly worsened since prior study. Minimal right base atelectasis. IMPRESSION: Worsening aeration with increasing atelectasis or infiltrate at the left base. Small left effusion. Minimal right base atelectasis. Electronically Signed   By: Rolm Baptise M.D.   On: 04/22/2016 07:51   Dg Chest Port 1 View  Result Date: 04/21/2016 CLINICAL DATA:  effusion EXAM: PORTABLE CHEST 1 VIEW COMPARISON:  04/20/2016, 04/18/2016 FINDINGS: Post sternotomy changes. Possible drain or radiopaque 2 being over the upper abdomen/ base of heart, correlate clinically. The right lung is grossly clear. Interval decrease in left-sided pleural effusion. Left basilar atelectasis or infiltrate. Stable cardiomegaly with mild  central congestion. No pneumothorax. IMPRESSION: 1. Slight decrease left-sided pleural effusion with mildly improved aeration at the left base. There is a small residual pleural effusion with subjacent atelectasis or infiltrate 2. Stable cardiomegaly Electronically Signed   By: Donavan Foil M.D.   On: 04/21/2016 18:30   Dg Chest Port 1 View  Result Date: 04/18/2016 CLINICAL DATA:  Severe LEFT-sided chest pain. Evaluate for pneumothorax. LEFT-sided thoracentesis earlier today. EXAM: PORTABLE CHEST 1 VIEW COMPARISON:  Portable chest radiograph earlier today. Also 04/16/2016. FINDINGS: Cardiomegaly persists. Prior median sternotomy for CABG and valve replacement appear stable.  RIGHT lung is clear. Dense retrocardiac region likely representing a combination of atelectasis, infiltrate and effusion. The volume of the effusion is decreased compared with the pre thoracentesis radiograph of 04/16/2016 but is unchanged from the radiograph earlier today. I do not see a pneumothorax. IMPRESSION: Stable exam.  No pneumothorax. Dense retrocardiac region representing atelectasis, infiltrate and effusion, but no significant increase in pleural effusion compared with film earlier today. Electronically Signed   By: Staci Righter M.D.   On: 04/18/2016 20:13   Dg Chest Port 1 View  Result Date: 04/01/2016 CLINICAL DATA:  Aortic stenosis EXAM: PORTABLE CHEST 1 VIEW COMPARISON:  March 31, 2016 FINDINGS: The PA catheter has been removed with a remaining right IJ sheath. The tiny right apical pneumothorax probably persists, similar to slightly more larger in the interval. No left-sided pneumothorax. Stable atelectasis in the left base. No other interval changes. IMPRESSION: 1. A tiny right apical pneumothorax likely remains, similar to slightly larger in the interval. 2. Persistent left atelectasis. 3. No other significant interval change. These results will be called to the ordering clinician or representative by the Radiologist Assistant, and communication documented in the PACS or zVision Dashboard. Electronically Signed   By: Dorise Bullion III M.D   On: 04/01/2016 08:22   Dg Chest Port 1 View  Result Date: 03/31/2016 CLINICAL DATA:  Status post CABG x2. Hypertension. Diabetes. Stroke. EXAM: PORTABLE CHEST 1 VIEW COMPARISON:  03/30/2016 FINDINGS: Extubation. Removal of nasogastric tube. Right internal jugular Swan-Ganz catheter unchanged with tip at pulmonary outflow tract. Median sternotomy for aortic valve repair. Mediastinal drain remains in place. Right chest tube has been removed. Midline trachea. Cardiomegaly accentuated by AP portable technique. No pleural fluid. 5% right apical  pneumothorax is identified. Mild left base atelectasis. Low lung volumes. IMPRESSION: Removal of support apparatus, including right-sided chest tube. A 5% right apical pneumothorax is identified. Cardiomegaly and low lung volumes with left base atelectasis. These results will be called to the ordering clinician or representative by the Radiologist Assistant, and communication documented in the PACS or zVision Dashboard. Electronically Signed   By: Abigail Miyamoto M.D.   On: 03/31/2016 07:23   Dg Chest Port 1 View  Result Date: 03/30/2016 CLINICAL DATA:  Status post CABG x2.  Chest tube placement. EXAM: PORTABLE CHEST 1 VIEW COMPARISON:  Preop study from 03/20/2016 FINDINGS: Heart is borderline enlarged. The patient is status post median sternotomy with aortic valvular replacement. Endotracheal tube tip is 3.5 cm above the carina in satisfactory position. Gastric tube extends into the left upper quadrant of the abdomen in the expected location of the stomach. A Swan-Ganz catheter is noted from right sided approach with tip in the expected region of the left pulmonary artery. Right-sided chest tube is seen along the medial aspect of the right hemithorax projecting up to the right posterior fourth rib level. No significant effusion or pneumothorax. No suspicious osseous abnormality.  IMPRESSION: Right-sided chest tube projects up to the posterior right fourth rib level without pneumothorax identified. Other support lines and tubes as above described. Electronically Signed   By: Ashley Royalty M.D.   On: 03/30/2016 14:44   US Thoracentesis Asp Pleural Space W/img Guide  Result Date: 04/18/2016 INDICATION: Patient with history of CHF s/p CABG and AVR three weeks ago now presents with left pleural effusion. Request is made for diagnostic and therapeutic thoracentesis EXAM: ULTRASOUND GUIDED DIAGNOSTIC AND THERAPEUTIC THORACENTESIS MEDICATIONS: 10 mL 1% lidocaine. COMPLICATIONS: None immediate. PROCEDURE: An ultrasound  guided thoracentesis was thoroughly discussed with the patient and questions answered. The benefits, risks, alternatives and complications were also discussed. The patient understands and wishes to proceed with the procedure. Written consent was obtained. Ultrasound was performed to localize and mark an adequate pocket of fluid in the left chest. The area was then prepped and draped in the normal sterile fashion. 1% Lidocaine was used for local anesthesia. Under ultrasound guidance a Safe-T-centesis catheter was introduced. Thoracentesis was performed. The catheter was removed and a dressing applied. FINDINGS: A total of approximately 1.4 liters of amber fluid was removed. Samples were sent to the laboratory as requested by the clinical team. IMPRESSION: Successful ultrasound guided diagnostic and therapeutic left thoracentesis yielding 1.4 liters of pleural fluid. Read by:  Brynda Greathouse PA-C Electronically Signed   By: Sandi Mariscal M.D.   On: 04/18/2016 16:13     Diagnostic Studies/Procedures   Transthoracic Echocardiography 04/17/2016 Study Conclusions - Left ventricle: The cavity size was normal. There was mild focal   basal and mild concentric hypertrophy of the septum. Systolic   function was normal. The estimated ejection fraction was in the   range of 55% to 60%. Wall motion was normal; there were no   regional wall motion abnormalities. - Aortic valve: Trileaflet; normal thickness leaflets. Mean   gradient (S): 15 mm Hg. Peak gradient (S): 32 mm Hg. - Mitral valve: There was mild regurgitation. - Left atrium: The atrium was moderately dilated. - Right ventricle: Systolic function was normal. - Right atrium: The atrium was mildly dilated. - Tricuspid valve: There was mild regurgitation. - Pulmonary arteries: Systolic pressure was within the normal   range. - Pericardium, extracardiac: A pericardial effusion was identified.   There was a left pleural effusion.  Impressions:  - S/P  AVR with a bioprosthetic valve with normal transaortic   gradients. No AI or paravalvular leak.   There is a large circumferential pericardial effusion with   largest diameter around the posterior wall measuring 3 cm.   There are no significant respiratory variations in the mitral   inflow, IVC is borderline dilated. There is RV invagination but   normal diastolic filling.   There is also a large pleural effusion.     Overall, there are no signs of hemodynamic compromise, but close   follow up with a limited study for evaluation of the pericardial   effusion in 2-3 days is recommended.  Transthoracic Echocardiography 04/20/2016  Study Conclusions - Left ventricle: The cavity size was normal. There was mild   concentric hypertrophy. Systolic function was normal. The   estimated ejection fraction was in the range of 50% to 55%. Wall   motion was normal; there were no regional wall motion   abnormalities. - Ventricular septum: Septal motion showed paradox. - Aortic valve: A bioprosthesis was present and functioning   normally. Valve area (VTI): 2.19 cm^2. Valve area (Vmax): 2.15   cm^2. Valve  area (Vmean): 2.05 cm^2. - Left atrium: The atrium was severely dilated. - Pericardium, extracardiac: A large pericardial effusion was   identified posterior to the heart and circumferential to the   heart. The fluid contained focal strands. There was mildright   ventricular chamber collapse for less than 50% of the cardiac   cycle. Now in atrial fibrillation, hard to evaluate for   respiratory flow variation. Features were probably consistent   with mild tamponade physiology. There was a left pleural   effusion.  Transthoracic Echocardiography 04/25/2016 Study Conclusions - Left ventricle: The cavity size was moderately dilated. There was   moderate concentric hypertrophy. Systolic function was normal.   The estimated ejection fraction was in the range of 50% to 55%.   Wall motion was normal;  there were no regional wall motion   abnormalities. - Aortic valve: A bovine bioprosthesis was present. Mean gradient   (S): 9 mm Hg. - Mitral valve: There was mild regurgitation. - Left atrium: The atrium was severely dilated. Anterior-posterior   dimension: 56 mm. - Tricuspid valve: There was trivial regurgitation. - Pericardium, extracardiac: A small, free-flowing pericardial   effusion was identified circumferential to the heart. The fluid   had no internal echoes. There was a right pleural effusion. There   was a large left pleural effusion. _____________   Disposition   Pt is being discharged home today in good condition.  Follow-up Plans & Appointments    Follow-up Information    [name], MD Follow up.   Specialty:  Cardiothoracic Surgery Why:  PA/LAT CXR to be taken (at [facility] which is in the same building as Dr. Leonarda Salon office) one hour prior to office appointment;Office will mail appointment date and time.  Contact information: [address] [place] [address] [phone]        Cecilie Kicks, NP. Go on 05/12/2016.   Specialties:  Cardiology, Radiology Why:  Appointment time is at 1:30 pm, please show up 10 minutes early to your appointment. Contact information: [address] [place] [address] [phone]        [place]. Go on 05/01/2016.   Why:  This appointment is to have your INR rechecked. Appointment is at [facility], please arrive 10 minutes early. Contact information: [place] [phone] [place] [facility] Follow up.   Specialty:  Home Health Services Why:  [facility] for CHF disease management Contact information: [address] [place] [address] [phone]            Discharge Medications     Medication List    STOP taking these medications     ibuprofen 200 MG tablet Commonly known as:  ADVIL,MOTRIN     TAKE these medications   acetaminophen 325 MG tablet Commonly known as:  TYLENOL Take 2 tablets (650 mg total) by mouth every 4 (four) hours as needed for headache or mild pain.   amiodarone 200 MG tablet Commonly known as:  PACERONE Take 1 tablet (200 mg total) by mouth daily. Start taking on:  04/29/2016 What changed:  when to take this  additional instructions   aspirin 325 MG EC tablet Take 1 tablet (325 mg total) by mouth daily.   atorvastatin 80 MG tablet Commonly known as:  LIPITOR Take 1 tablet (80 mg total) by mouth daily at 6 PM.   carvedilol 3.125  MG tablet Commonly known as:  COREG Take 1 tablet (3.125 mg total) by mouth 2 (two) times daily with a meal.   colchicine 0.6 MG tablet Take 1 tablet (0.6 mg total) by mouth 2 (two) times daily.   furosemide 40 MG tablet Commonly known as:  LASIX Take 1 tablet (40 mg total) by mouth 2 (two) times daily. What changed:  when to take this  additional instructions   gabapentin 100 MG capsule Commonly known as:  NEURONTIN Take 1 capsule (100 mg total) by mouth daily.   glipiZIDE 10 MG tablet Commonly known as:  GLUCOTROL Take 1 tablet (10 mg total) by mouth 2 (two) times daily before a meal. What changed:  when to take this   glucose blood test strip Commonly known as:  ACCU-CHEK ACTIVE STRIPS Use as instructed   metFORMIN 500 MG 24 hr tablet Commonly known as:  GLUCOPHAGE-XR Take 1,000 mg by mouth 2 (two) times daily.   nitroGLYCERIN 0.4 MG SL tablet Commonly known as:  NITROSTAT Place 1 tablet (0.4 mg total) under the tongue every 5 (five) minutes as needed for chest pain.   onetouch ultrasoft lancets Use as instructed   oxyCODONE 5 MG immediate release tablet Commonly known as:  Oxy IR/ROXICODONE Take 1-2 tablets (5-10 mg total) by mouth every 3 (three) hours as needed for severe pain.   potassium chloride SA 20 MEQ tablet Commonly  known as:  K-DUR,KLOR-CON Take 1 tablet (20 mEq total) by mouth daily. Start taking on:  04/29/2016 What changed:  additional instructions   predniSONE 10 MG (21) Tbpk tablet Commonly known as:  STERAPRED UNI-PAK 21 TAB Take 1 tablet (10 mg total) by mouth taper from 4 doses each day to 1 dose and stop. Take 1 tab 4 X a day for 04/28/16 ( you have had 2 doses today) Take 1 tab 3 X a day 04/29/16 Take 1 tab 2 X a day 04/30/16 Take 1 tab 05/01/16 Take  1 tab 05/02/16 then stop   senna-docusate 8.6-50 MG tablet Commonly known as:  Senokot-S Take 1 tablet by mouth at bedtime.   tamsulosin 0.4 MG Caps capsule Commonly known as:  FLOMAX Take 1 capsule (0.4 mg total) by mouth daily after supper.   traMADol 50 MG tablet Commonly known as:  ULTRAM Take 1-2 tablets (50-100 mg total) by mouth every 6 (six) hours as needed (mild pain).   VITAMIN C PO Take 1 tablet by mouth daily.   warfarin 5 MG tablet Commonly known as:  COUMADIN Take 1 tablet (5 mg total) by mouth daily.      Outstanding Labs/Studies    He will need INR check in the office on Monday 05/01/16     Duration of Discharge Encounter   Greater than 30 minutes including physician time.  Kristopher Glee PA-C 04/28/2016, 2:59 PM   I have examined the patient and reviewed assessment and plan and discussed with patient.  Agree with above as stated.  Amio to maintain NSR.  Coumadin for stroke prevention.  Steroids to decrease inflammation and decrease likelihood of another effusion.  Aggressive secondary prevention for CAD.  DM. Lipid and BP control.  COnsider limited echo in the future after steroid taper if he has worsening sx or concerns for increased effusion.  Pleural effusion also present.  COntinue Lasix.  Renal function stable.  Potassium stable.  Continue Lasix at current dose.  Could decrease at OP appt.    Larae Grooms

## 2016-04-28 NOTE — Progress Notes (Signed)
Pt. Refuses flu vaccine and pneumoccal vaccine.

## 2016-04-28 NOTE — Progress Notes (Signed)
CARDIAC REHAB PHASE I   PRE:  Rate/Rhythm: 83 SR  BP:  Supine:   Sitting: 126/77  Standing:    SaO2: 100%RA  MODE:  Ambulation: 460 ft   POST:  Rate/Rhythm: 91 SR  BP:  Supine: 165/93  Sitting:   Standing:    SaO2: 99%RA 1047-1115 Pt walked 460 ft independently without assistance. He would get wobbly at times but corrected self without help. Looks great. Pt shown how to take pulse as he asked about feeling for irregular heart beat. Also gave low sodium handouts and encouraged 2000 mg restriction. To bed after walk. Stated he has been walking several times before this walk.   Graylon Good, RN BSN  04/28/2016 11:11 AM

## 2016-04-28 NOTE — Progress Notes (Signed)
7 Days Post-Op Procedure(s) (LRB): SUBXYPHOID PERICARDIAL WINDOW (N/A) TRANSESOPHAGEAL ECHOCARDIOGRAM (TEE) (N/A) Subjective: Feels better today Asked if he felt weak- "that was yesterday"  Objective: Vital signs in last 24 hours: Temp:  [97.5 F (36.4 C)-98.5 F (36.9 C)] 97.5 F (36.4 C) (02/16 0720) Pulse Rate:  [81-87] 87 (02/16 0353) Cardiac Rhythm: Heart block;Normal sinus rhythm;Bundle branch block (02/16 0720) Resp:  [13-20] 13 (02/16 0353) BP: (129-141)/(75-82) 135/77 (02/16 0353) SpO2:  [99 %-100 %] 99 % (02/16 0353) Weight:  [201 lb 15.1 oz (91.6 kg)] 201 lb 15.1 oz (91.6 kg) (02/16 0355)  Hemodynamic parameters for last 24 hours:    Intake/Output from previous day: 02/15 0701 - 02/16 0700 In: -  Out: 1250 [Urine:1250] Intake/Output this shift: No intake/output data recorded.  General appearance: alert, cooperative and no distress Neurologic: intact Heart: regular rate and rhythm Lungs: clear to auscultation bilaterally Wound: clean and dry  Lab Results:  Recent Labs  04/27/16 0352 04/28/16 0401  WBC 5.6 7.6  HGB 11.0* 10.9*  HCT 33.9* 33.8*  PLT 322 327   BMET: No results for input(s): NA, K, CL, CO2, GLUCOSE, BUN, CREATININE, CALCIUM in the last 72 hours.  PT/INR:  Recent Labs  04/28/16 0401  LABPROT 18.9*  INR 1.57   ABG    Component Value Date/Time   PHART 7.465 (H) 04/22/2016 0325   HCO3 26.8 04/22/2016 0325   TCO2 24 03/31/2016 1614   ACIDBASEDEF 3.0 (H) 03/30/2016 1850   O2SAT 94.7 04/22/2016 0325   CBG (last 3)   Recent Labs  04/27/16 1758 04/27/16 2210 04/28/16 0811  GLUCAP 133* 205* 159*    Assessment/Plan: S/P Procedure(s) (LRB): SUBXYPHOID PERICARDIAL WINDOW (N/A) TRANSESOPHAGEAL ECHOCARDIOGRAM (TEE) (N/A) -Looks good this AM -wounds healing well -INR trending upward -OK to dc from surgical standpoint -Finish steroid taper   LOS: 12 days    [name] 04/28/2016

## 2016-04-28 NOTE — Progress Notes (Signed)
ANTICOAGULATION CONSULT NOTE - Follow Up Consult  Pharmacy Consult for Coumadin Indication: S/p AVR / atrial fibrillation   Labs:  Recent Labs  04/26/16 0149 04/27/16 0352 04/28/16 0401  HGB 10.3* 11.0* 10.9*  HCT 32.4* 33.9* 33.8*  PLT 307 322 327  LABPROT 14.2 16.3* 18.9*  INR 1.09 1.30 1.57     Assessment: 66yo male s/p AVR (bioprosthetic) on 03/30/16 here for SOB and noted with afib. CHADSVasC = 4. Heparin restarted s/p pericardial window. Last heparin level therapeutic at 0.41 before stopping due to blood clotted pericardial drain. Drain now removed.  Cards ok with holding off on restarting heparin. Warfarin started on 2/12.   INR remains SUBtherapeutic today though trending up (INR 1.57 << 1.3, goal of 2-3). Hgb 10.9, plts wnl.   Goal of Therapy:  INR goal 2-3   Plan:  1. Warfarin 5 mg x 1 dose at 1800 today 2. Will continue to monitor for any signs/symptoms of bleeding and will follow up with PT/INR in the a.m.   Thank you for allowing pharmacy to be a part of this patient's care.  Alycia Rossetti, PharmD, BCPS Clinical Pharmacist Pager: [phone] Clinical phone for 04/28/2016 from 7a-3:30p: [phone] If after 3:30p, please call main pharmacy at: [phone] 04/28/2016 10:06 AM

## 2016-04-29 ENCOUNTER — Telehealth: Payer: Self-pay | Admitting: *Deleted

## 2016-04-29 DIAGNOSIS — I350 Nonrheumatic aortic (valve) stenosis: Secondary | ICD-10-CM | POA: Diagnosis not present

## 2016-04-29 DIAGNOSIS — Z48812 Encounter for surgical aftercare following surgery on the circulatory system: Secondary | ICD-10-CM | POA: Diagnosis not present

## 2016-05-01 ENCOUNTER — Encounter: Payer: Medicare Other | Admitting: Cardiology

## 2016-05-01 ENCOUNTER — Ambulatory Visit (INDEPENDENT_AMBULATORY_CARE_PROVIDER_SITE_OTHER): Payer: Medicare Other | Admitting: Pharmacist

## 2016-05-01 DIAGNOSIS — I2510 Atherosclerotic heart disease of native coronary artery without angina pectoris: Secondary | ICD-10-CM

## 2016-05-01 DIAGNOSIS — Z952 Presence of prosthetic heart valve: Secondary | ICD-10-CM

## 2016-05-01 DIAGNOSIS — Z48812 Encounter for surgical aftercare following surgery on the circulatory system: Secondary | ICD-10-CM | POA: Diagnosis not present

## 2016-05-01 DIAGNOSIS — I481 Persistent atrial fibrillation: Secondary | ICD-10-CM | POA: Diagnosis not present

## 2016-05-01 DIAGNOSIS — I4819 Other persistent atrial fibrillation: Secondary | ICD-10-CM

## 2016-05-01 DIAGNOSIS — Z5181 Encounter for therapeutic drug level monitoring: Secondary | ICD-10-CM | POA: Diagnosis not present

## 2016-05-01 DIAGNOSIS — I350 Nonrheumatic aortic (valve) stenosis: Secondary | ICD-10-CM | POA: Diagnosis not present

## 2016-05-01 LAB — POCT INR: INR: 1.8

## 2016-05-02 DIAGNOSIS — Z48812 Encounter for surgical aftercare following surgery on the circulatory system: Secondary | ICD-10-CM | POA: Diagnosis not present

## 2016-05-02 DIAGNOSIS — I350 Nonrheumatic aortic (valve) stenosis: Secondary | ICD-10-CM | POA: Diagnosis not present

## 2016-05-03 ENCOUNTER — Emergency Department (HOSPITAL_COMMUNITY): Payer: Medicare Other

## 2016-05-03 ENCOUNTER — Telehealth: Payer: Self-pay | Admitting: Cardiovascular Disease

## 2016-05-03 ENCOUNTER — Emergency Department (HOSPITAL_COMMUNITY)
Admission: EM | Admit: 2016-05-03 | Discharge: 2016-05-03 | Disposition: A | Discharge status: Home / Self Care | Payer: Medicare Other | Attending: Emergency Medicine | Admitting: Emergency Medicine

## 2016-05-03 ENCOUNTER — Encounter (HOSPITAL_COMMUNITY): Payer: Self-pay | Admitting: *Deleted

## 2016-05-03 DIAGNOSIS — I110 Hypertensive heart disease with heart failure: Secondary | ICD-10-CM | POA: Diagnosis not present

## 2016-05-03 DIAGNOSIS — I2510 Atherosclerotic heart disease of native coronary artery without angina pectoris: Secondary | ICD-10-CM | POA: Insufficient documentation

## 2016-05-03 DIAGNOSIS — Z951 Presence of aortocoronary bypass graft: Secondary | ICD-10-CM | POA: Insufficient documentation

## 2016-05-03 DIAGNOSIS — E1149 Type 2 diabetes mellitus with other diabetic neurological complication: Secondary | ICD-10-CM | POA: Insufficient documentation

## 2016-05-03 DIAGNOSIS — I350 Nonrheumatic aortic (valve) stenosis: Secondary | ICD-10-CM | POA: Diagnosis not present

## 2016-05-03 DIAGNOSIS — Z48812 Encounter for surgical aftercare following surgery on the circulatory system: Secondary | ICD-10-CM | POA: Diagnosis not present

## 2016-05-03 DIAGNOSIS — Z7982 Long term (current) use of aspirin: Secondary | ICD-10-CM | POA: Insufficient documentation

## 2016-05-03 DIAGNOSIS — Z8673 Personal history of transient ischemic attack (TIA), and cerebral infarction without residual deficits: Secondary | ICD-10-CM | POA: Insufficient documentation

## 2016-05-03 DIAGNOSIS — Z7901 Long term (current) use of anticoagulants: Secondary | ICD-10-CM | POA: Diagnosis not present

## 2016-05-03 DIAGNOSIS — J90 Pleural effusion, not elsewhere classified: Secondary | ICD-10-CM | POA: Diagnosis not present

## 2016-05-03 DIAGNOSIS — Z79899 Other long term (current) drug therapy: Secondary | ICD-10-CM | POA: Insufficient documentation

## 2016-05-03 DIAGNOSIS — I5033 Acute on chronic diastolic (congestive) heart failure: Secondary | ICD-10-CM | POA: Insufficient documentation

## 2016-05-03 DIAGNOSIS — Z7984 Long term (current) use of oral hypoglycemic drugs: Secondary | ICD-10-CM | POA: Diagnosis not present

## 2016-05-03 DIAGNOSIS — R635 Abnormal weight gain: Secondary | ICD-10-CM

## 2016-05-03 LAB — BASIC METABOLIC PANEL
Anion gap: 12 (ref 5–15)
BUN: 19 mg/dL (ref 6–20)
CHLORIDE: 98 mmol/L — AB (ref 101–111)
CO2: 25 mmol/L (ref 22–32)
CREATININE: 1.23 mg/dL (ref 0.61–1.24)
Calcium: 9.3 mg/dL (ref 8.9–10.3)
GFR, EST NON AFRICAN AMERICAN: 59 mL/min — AB (ref >60)
Glucose, Bld: 141 mg/dL — ABNORMAL HIGH (ref 65–99)
POTASSIUM: 4.5 mmol/L (ref 3.5–5.1)
SODIUM: 135 mmol/L (ref 135–145)

## 2016-05-03 LAB — CBC
HEMATOCRIT: 35.2 % — AB (ref 39.0–52.0)
Hemoglobin: 11.5 g/dL — ABNORMAL LOW (ref 13.0–17.0)
MCH: 26 pg (ref 26.0–34.0)
MCHC: 32.7 g/dL (ref 30.0–36.0)
MCV: 79.6 fL (ref 78.0–100.0)
PLATELETS: 443 10*3/uL — AB (ref 150–400)
RBC: 4.42 MIL/uL (ref 4.22–5.81)
RDW: 14.9 % (ref 11.5–15.5)
WBC: 8.4 10*3/uL (ref 4.0–10.5)

## 2016-05-03 LAB — I-STAT TROPONIN, ED: Troponin i, poc: 0.02 ng/mL (ref 0.00–0.08)

## 2016-05-03 LAB — BRAIN NATRIURETIC PEPTIDE: B NATRIURETIC PEPTIDE 5: 128.1 pg/mL — AB (ref 0.0–100.0)

## 2016-05-03 NOTE — Discharge Instructions (Signed)
It is nice taking care of you! You were seen due to concern about weight gain and shortness of breath. However, your weight today is 197 lbs, which is lower than your weight when you were discharged from the hospital about 5 days ago. At that time your weight was 201 lbs. Please call the cardiologist's office and schedule a follow-up appointment. Please see immediate medical care If you have chest pain, shortness of breath, leg swelling or other symptoms concerning to you.

## 2016-05-03 NOTE — ED Provider Notes (Signed)
[facility] DEPT Provider Note   CSN: [id number] Arrival date & time: 05/03/16  1328     History   Chief Complaint Chief Complaint  Patient presents with  . Chest Pain    HPI MURICE FINIGAN is a 66 y.o. male.  HPI RONY HOEFER is a 66 yo male with History of CAD status post CABG and AVR on 03/30/2016 recent hospitalization for atrial fibrillation, CHF and pericardial effusion status post pericardial window who presents with "10 lbs weight gain and generalized weakness". Patient has home health nurse who checked his weight today and told him his weight has gone up by 10 pounds. Patient's nurse reported increased weight and shortness of breath at rest to his cardiologist who advised patient to go to ED. Patient reports generalized weakness for 2 days but denies dyspnea, chest pain, palpitation, cough or leg swelling or pain. He reports orthopnea which is at baseline for over two weeks. Reports good compliance with his medications. Reports having good urine output. Denies increased salt or fluid intake. Denies headache, fever, chills, sore throat, nausea, vomiting or dysuria.  Patient was discharged about 5 days ago. His discharge weight was 201 pounds. Today his weight is 197 pounds although on different scale.  Past Medical History:  Diagnosis Date  . Acute diastolic congestive heart failure ([place])   . Aortic valve stenosis   . Atrial fibrillation ([place]) - post-op CABG    04/2016 CHA2DS2VAS score = 5  . Atypical nevi   . Coronary artery disease   . Depression    "years ago"  . Diabetes mellitus   . GERD (gastroesophageal reflux disease)   . History of kidney stones   . Hyperlipidemia    hx of transaminitis secondary to statin and he has decided not to use statins secondary to potential side effects.  . Hypertension   . Nephrolithiasis   . Osteoarthritis, knee   . Sleep apnea     Central apnea. Not using cpap  . Stroke [facility]) 2007  . Transaminitis     Statin-induced  .  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 2-D echo was normal no embolic source found.    Patient Active Problem List   Diagnosis Date Noted  . Encounter for therapeutic drug monitoring 05/01/2016  . Pericardial effusion a. subxiphoid pericardial window on 04/21/2016 04/28/2016  . Pleural effusion on left, s/p throacentesis 04/18/16 04/28/2016  . Chest pain   . Acute on chronic diastolic heart failure ([place]) 04/16/2016  . Persistent atrial fibrillation ([place]) 04/16/2016  . Anemia due to blood loss, requiring transfusion of PRBCs 04/16/2016  . 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  . CAD (coronary artery disease), native coronary artery   . Acute diastolic CHF (congestive heart failure) ([place])   . Sleep apnea 08/02/2009  . 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 Dyslipidemia 05/18/2006  . Hypertensive heart disease   . History of dissection of vertebral artery  ([place]) 08/02/2005    Past Surgical History:  Procedure Laterality Date  . AORTIC VALVE REPLACEMENT N/A 03/30/2016   Procedure: AORTIC VALVE REPLACEMENT (AVR);  Surgeon: [name], MD;  Location: [place];  Service: Open  Heart Surgery;  Laterality: N/A;  . CARDIAC CATHETERIZATION N/A 03/15/2016   Procedure: Right/Left Heart Cath and Coronary Angiography;  Surgeon: Burnell Blanks, MD;  Location: [place] CV LAB;  Service: Cardiovascular;  Laterality: N/A;  . CLIPPING OF ATRIAL APPENDAGE N/A 03/30/2016   Procedure: CLIPPING OF LEFT ATRIAL APPENDAGE;  Surgeon: [name], MD;  Location: [place];  Service: Open Heart Surgery;  Laterality: N/A;  . CORONARY ARTERY  BYPASS GRAFT N/A 03/30/2016   Procedure: CORONARY ARTERY BYPASS GRAFTING (CABG) Times Two;  Surgeon: [name], MD;  Location: [place];  Service: Open Heart Surgery;  Laterality: N/A;  . KNEE ARTHROSCOPY W/ PARTIAL MEDIAL MENISCECTOMY  05/12/2005   right, performed by Dr. French Ana for torn medial meniscus.  Marland Kitchen ROTATOR CUFF REPAIR Right 2016  . SUBXYPHOID PERICARDIAL WINDOW N/A 04/21/2016   Procedure: SUBXYPHOID PERICARDIAL WINDOW;  Surgeon: [name], MD;  Location: [place];  Service: Thoracic;  Laterality: N/A;  . TEE WITHOUT CARDIOVERSION N/A 03/30/2016   Procedure: TRANSESOPHAGEAL ECHOCARDIOGRAM (TEE);  Surgeon: [name], MD;  Location: [place];  Service: Open Heart Surgery;  Laterality: N/A;  . TEE WITHOUT CARDIOVERSION N/A 04/21/2016   Procedure: TRANSESOPHAGEAL ECHOCARDIOGRAM (TEE);  Surgeon: [name], MD;  [facility];  Service: Thoracic;  Laterality: N/A;       Home Medications    Prior to Admission medications   Medication Sig Start Date End Date Taking? Authorizing Provider  acetaminophen (TYLENOL) 325 MG tablet Take 2 tablets (650 mg total) by mouth every 4 (four) hours as needed for headache or mild pain. 04/28/16  Yes Isaiah Serge, NP  amiodarone (PACERONE) 200 MG tablet Take 1 tablet (200 mg total) by mouth daily. 04/29/16  Yes Isaiah Serge, NP  Ascorbic Acid (VITAMIN C PO) Take 1 tablet by mouth daily.   Yes Historical Provider, MD  aspirin EC 325 MG EC tablet Take 1 tablet (325 mg total) by mouth daily. 04/10/16  Yes Erin R Barrett, PA-C  carvedilol (COREG) 3.125 MG tablet Take 1 tablet (3.125 mg total) by mouth 2 (two) times daily with a meal. 04/10/16  Yes Erin R Barrett, PA-C  colchicine 0.6 MG tablet Take 1 tablet (0.6 mg total) by mouth 2 (two) times daily. 04/28/16  Yes Isaiah Serge, NP  furosemide (LASIX) 40 MG tablet Take 1 tablet (40 mg total) by mouth 2 (two) times daily. 04/28/16  Yes Isaiah Serge, NP  gabapentin (NEURONTIN)  100 MG capsule Take 1 capsule (100 mg total) by mouth daily. 04/11/16 05/11/16 Yes [name], MD  glipiZIDE (GLUCOTROL) 10 MG tablet Take 1 tablet (10 mg total) by mouth 2 (two) times daily before a meal. 04/28/16  Yes Isaiah Serge, NP  metFORMIN (GLUCOPHAGE-XR) 500 MG 24 hr tablet Take 1,000 mg by mouth 2 (two) times daily. 03/08/16  Yes Historical Provider, MD  nitroGLYCERIN (NITROSTAT) 0.4 MG SL tablet Place 1 tablet (0.4 mg total) under the tongue every 5 (five) minutes as needed for chest pain. 04/28/16  Yes Isaiah Serge, NP  oxyCODONE (OXY IR/ROXICODONE) 5 MG immediate release tablet Take 1-2 tablets (5-10 mg total) by mouth every 3 (three) hours as needed for severe pain. 04/10/16  Yes Erin R Barrett, PA-C  potassium chloride SA (K-DUR,KLOR-CON) 20 MEQ tablet Take 1 tablet (20 mEq total) by mouth daily. 04/29/16  Yes Isaiah Serge, NP  senna-docusate (SENOKOT-S) 8.6-50 MG tablet Take 1 tablet by mouth at bedtime.  04/28/16  Yes Isaiah Serge, NP  tamsulosin (FLOMAX) 0.4 MG CAPS capsule Take 1 capsule (0.4 mg total) by mouth daily after supper. 04/10/16  Yes Erin R Barrett, PA-C  traMADol (ULTRAM) 50 MG tablet Take 1-2 tablets (50-100 mg total) by mouth every 6 (six) hours as needed (mild pain). 04/28/16  Yes Isaiah Serge, NP  warfarin (COUMADIN) 5 MG tablet Take 1 tablet (5 mg total) by mouth daily. Patient taking differently: Take 2.5-5 mg by mouth daily. Take 2.5mg  on Mon and Friday. Then take 5mg  rest of days per patient 04/28/16  Yes Isaiah Serge, NP  atorvastatin (LIPITOR) 80 MG tablet Take 1 tablet (80 mg total) by mouth daily at 6 PM. Patient not taking: Reported on 04/16/2016 03/18/16   Erlene Quan, PA-C  glucose blood (ACCU-CHEK ACTIVE STRIPS) test strip Use as instructed 04/28/16   Isaiah Serge, NP  Lancets [facility] ULTRASOFT) lancets Use as instructed 04/28/16   Isaiah Serge, NP  predniSONE (STERAPRED UNI-PAK 21 TAB) 10 MG (21) TBPK tablet Take 1 tablet (10 mg total) by  mouth taper from 4 doses each day to 1 dose and stop. Take 1 tab 4 X a day for 04/28/16 ( you have had 2 doses today) Take 1 tab 3 X a day 04/29/16 Take 1 tab 2 X a day 04/30/16 Take 1 tab 05/01/16 Take  1 tab 05/02/16 then stop Patient not taking: Reported on 05/03/2016 04/28/16   Isaiah Serge, NP    Family History Family History  Problem Relation Age of Onset  . Hypertension Mother   . Diabetes Mother   . Alcohol abuse Father     Social History Social History  Substance Use Topics  . Smoking status: Never Smoker  . Smokeless tobacco: Never Used  . Alcohol use Yes     Comment: occasional     Allergies   Crab (diagnostic); Penicillins; Statins; and Morphine and related   Review of Systems Review of Systems  Constitutional: Negative for chills and fever.  HENT: Negative for congestion, ear pain, rhinorrhea and sore throat.   Eyes: Negative for pain and visual disturbance.  Respiratory: Negative for cough, chest tightness and shortness of breath.   Cardiovascular: Negative for chest pain, palpitations and leg swelling.  Gastrointestinal: Negative for abdominal pain, diarrhea, nausea and vomiting.  Genitourinary: Negative for decreased urine volume, dysuria and hematuria.  Musculoskeletal: Negative for back pain and myalgias.  Skin: Negative for color change and rash.  Neurological: Negative for dizziness, seizures, syncope, weakness and light-headedness.  Psychiatric/Behavioral: Negative for confusion.  All other systems reviewed and are negative.  Physical Exam Updated Vital Signs BP 127/84   Pulse 83   Temp 97.5 F (36.4 C) (Oral)   Resp 17   Wt 89.4 kg   SpO2 100%   BMI 29.95 kg/m   Physical Exam GEN: appears well, no apparent distress, able to sit up in bed easily for exam, able to lie down flat for bedside ultrasound Head: normocephalic and atraumatic  Eyes: conjunctiva without injection, sclera anicteric Nares: no rhinorrhea, congestion or  erythema Oropharynx: mmm without erythema or exudation HEM: negative for cervical or periauricular lymphadenopathies CVS: RRR, s1 & s2 heard, 2/6 SEM over LUSB, LLSB and apical area, no edema,  2+ radial, DP & PT pulses bilaterally, cap refills < 2 secs, no JVD or HJR RESP: speaks in full sentence, no IWOB, good air movement bilaterally, CTAB GI: BS present & normal, soft, NTND, no  guarding, no rebound, no mass GU: no suprapubic or CVA tenderness MSK: no focal tenderness, notable swelling, surgical wound clean, dry and healing SKIN: no apparent skin lesion except surgical wound which is clean, dry and healing NEURO: alert and oiented appropriately, no gross defecits  PSYCH: euthymic mood with congruent affect  ED Treatments / Results  Labs (all labs ordered are listed, but only abnormal results are displayed) Labs Reviewed  BASIC METABOLIC PANEL - Abnormal; Notable for the following:       Result Value   Chloride 98 (*)    Glucose, Bld 141 (*)    GFR calc non Af Amer 59 (*)    All other components within normal limits  CBC - Abnormal; Notable for the following:    Hemoglobin 11.5 (*)    HCT 35.2 (*)    Platelets 443 (*)    All other components within normal limits  BRAIN NATRIURETIC PEPTIDE - Abnormal; Notable for the following:    B Natriuretic Peptide 128.1 (*)    All other components within normal limits  I-STAT TROPOININ, ED    EKG  EKG Interpretation  Date/Time:  Wednesday May 03 2016 13:36:28 EST Ventricular Rate:  89 PR Interval:  214 QRS Duration: 122 QT Interval:  368 QTC Calculation: 447 R Axis:   -73 Text Interpretation:  Sinus rhythm with 1st degree A-V block Left axis deviation Non-specific intra-ventricular conduction delay T wave abnormality, consider inferior ischemia Abnormal ECG increased inferior ischemic appearance compared to old. Confirmed by [name], [place], Jeannie Done [phone]) on 05/03/2016 1:43:44 PM       Radiology Dg Chest 2 View  Result Date:  05/03/2016 CLINICAL DATA:  Status post CABG and aortic valve replacement 03/30/2016. EXAM: CHEST  2 VIEW COMPARISON:  PA and lateral chest 04/27/2016. Single-view of the chest 04/24/2016. FINDINGS: Six intact median sternotomy wires are unchanged. The lungs are clear. No pneumothorax. Small left pleural effusion seen on the prior examination has resolved. No right pleural effusion. No pneumothorax. IMPRESSION: No acute disease. Small left pleural effusion seen on the most recent study has resolved. Electronically Signed   By: Inge Rise M.D.   On: 05/03/2016 14:53    Procedures Procedures (including critical care time)  Medications Ordered in ED Medications - No data to display   Initial Impression / Assessment and Plan / ED Course  I have reviewed the triage vital signs and the nursing notes.  Pertinent labs & imaging results that were available during my care of the patient were reviewed by me and considered in my medical decision making (see chart for details).  A 66 yo male with history of CAD s/p CABG and AVR on 03/30/2016 recent hospitalization for atrial fibrillation, CHF and pericardial effusion status post pericardial window who presents with concern for "10 lbs" weight gain. It appears there is some confusion per telephone conversation between his home health nurse and cardiologist that states patient has 10 lbs weight gain and dyspnea at rest. Patient denies shortness of breath or chest pain. After reviewing his weight trend in Epic, patient had a discharge weight of 201 pounds about 5 days ago. Today his weight is 197 pounds although on a different scale. He has no dyspnea or chest pain. He has no signs of fluid overload on exam.   Off note, patient was lying flat on bed the whole time he was here.  Discussed case and findings with Cardiologist, Dr. Burt Knack. After discussing with patient's cardiologist, Dr. Julianne Handler, Dr. Burt Knack didn't  think there is a need for admission and  recommended discharging patient.   Final Clinical Impressions(s) / ED Diagnoses   Final diagnoses:  None    New Prescriptions New Prescriptions   No medications on file     Mercy Riding, MD 05/03/16 [place], MD 05/04/16 [phone]

## 2016-05-03 NOTE — Telephone Encounter (Signed)
New message      Calling to talk to someone to report pt has a 10lb wt gain within a 24hr period.  Pt is also sob.  Nurse request to speak to someone now

## 2016-05-03 NOTE — ED Triage Notes (Signed)
Pt states that he has chest soreness. Pt states that he was discharged 1 week ago from heart valve replacement. Pt states that he had a 10lb weight gain in 1 day. Pt reports increased tiredness.

## 2016-05-03 NOTE — ED Notes (Signed)
EDP at bedside  

## 2016-05-03 NOTE — Telephone Encounter (Signed)
Received call transferred from operator and spoke with pt's home health nurse.  He reports he saw pt yesterday and his weight was 187 lbs. Was not having shortness of breath at that time.  Today nurse is seeing pt and pt's weight is 197 lbs.  Heart rate 88 and irregular. Oxygen sat 95%. Pt has no energy. He is short of breath at rest.  I reviewed with Dr. [name] who recommended pt go to ED.  I instructed home health nurse they should call EMS to transport pt to hospital.  Pt's brother is with him and pt prefers his brother transport him to ED.

## 2016-05-05 ENCOUNTER — Other Ambulatory Visit: Payer: Self-pay | Admitting: *Deleted

## 2016-05-05 DIAGNOSIS — J90 Pleural effusion, not elsewhere classified: Secondary | ICD-10-CM

## 2016-05-05 DIAGNOSIS — I313 Pericardial effusion (noninflammatory): Secondary | ICD-10-CM

## 2016-05-05 DIAGNOSIS — I3139 Other pericardial effusion (noninflammatory): Secondary | ICD-10-CM

## 2016-05-08 ENCOUNTER — Ambulatory Visit (INDEPENDENT_AMBULATORY_CARE_PROVIDER_SITE_OTHER): Payer: Medicare Other | Admitting: Pharmacist

## 2016-05-08 DIAGNOSIS — Z952 Presence of prosthetic heart valve: Secondary | ICD-10-CM

## 2016-05-08 DIAGNOSIS — I4819 Other persistent atrial fibrillation: Secondary | ICD-10-CM

## 2016-05-08 DIAGNOSIS — Z5181 Encounter for therapeutic drug level monitoring: Secondary | ICD-10-CM

## 2016-05-08 DIAGNOSIS — Z48812 Encounter for surgical aftercare following surgery on the circulatory system: Secondary | ICD-10-CM | POA: Diagnosis not present

## 2016-05-08 DIAGNOSIS — I481 Persistent atrial fibrillation: Secondary | ICD-10-CM

## 2016-05-08 DIAGNOSIS — I350 Nonrheumatic aortic (valve) stenosis: Secondary | ICD-10-CM | POA: Diagnosis not present

## 2016-05-08 LAB — POCT INR: INR: 2.8

## 2016-05-09 ENCOUNTER — Encounter: Payer: Self-pay | Admitting: Thoracic Surgery (Cardiothoracic Vascular Surgery)

## 2016-05-09 ENCOUNTER — Ambulatory Visit
Admission: RE | Admit: 2016-05-09 | Discharge: 2016-05-09 | Disposition: A | Discharge status: Home / Self Care | Payer: Medicare Other | Source: Ambulatory Visit | Attending: Thoracic Surgery (Cardiothoracic Vascular Surgery) | Admitting: Thoracic Surgery (Cardiothoracic Vascular Surgery)

## 2016-05-09 ENCOUNTER — Other Ambulatory Visit: Payer: Self-pay | Admitting: *Deleted

## 2016-05-09 ENCOUNTER — Ambulatory Visit (INDEPENDENT_AMBULATORY_CARE_PROVIDER_SITE_OTHER): Payer: Self-pay | Admitting: Thoracic Surgery (Cardiothoracic Vascular Surgery)

## 2016-05-09 VITALS — BP 117/78 | HR 81 | Resp 16 | Ht 68.0 in | Wt 200.0 lb

## 2016-05-09 DIAGNOSIS — Z48812 Encounter for surgical aftercare following surgery on the circulatory system: Secondary | ICD-10-CM | POA: Diagnosis not present

## 2016-05-09 DIAGNOSIS — I313 Pericardial effusion (noninflammatory): Secondary | ICD-10-CM

## 2016-05-09 DIAGNOSIS — I350 Nonrheumatic aortic (valve) stenosis: Secondary | ICD-10-CM | POA: Diagnosis not present

## 2016-05-09 DIAGNOSIS — J90 Pleural effusion, not elsewhere classified: Secondary | ICD-10-CM | POA: Diagnosis not present

## 2016-05-09 DIAGNOSIS — N3090 Cystitis, unspecified without hematuria: Secondary | ICD-10-CM | POA: Diagnosis not present

## 2016-05-09 DIAGNOSIS — Z09 Encounter for follow-up examination after completed treatment for conditions other than malignant neoplasm: Secondary | ICD-10-CM

## 2016-05-09 DIAGNOSIS — I3139 Other pericardial effusion (noninflammatory): Secondary | ICD-10-CM

## 2016-05-09 LAB — URINALYSIS
Bilirubin Urine: NEGATIVE
Glucose, UA: NEGATIVE
HGB URINE DIPSTICK: NEGATIVE
Ketones, ur: NEGATIVE
Leukocytes, UA: NEGATIVE
NITRITE: NEGATIVE
PH: 5.5 (ref 5.0–8.0)
PROTEIN: NEGATIVE
Specific Gravity, Urine: 1.013 (ref 1.001–1.035)

## 2016-05-09 NOTE — Progress Notes (Signed)
[address]       [place] [address]             [phone]    HPI: Mark Cabrera returns for a scheduled postoperative follow-up visit  Mark Cabrera is a 66 year old gentleman who presented back in January with heart failure. He was found to have aortic stenosis and coronary disease. He underwent aortic valve replacement and coronary bypass grafting 2 on 03/30/2016. His postoperative course was complicated by atrial fibrillation.   He was readmitted on 04/16/2016 with increasing shortness of breath and worsening peripheral edema. He was found to have a large left pleural effusion and a large pericardial effusion. This was consistent with post pericardiotomy syndrome. He had a thoracentesis and on 04/21/2016 underwent subxiphoid pericardial window after a repeat echo showed an increase in the size of his pericardial effusion. His postoperative course was uneventful and he was treated with steroids and colchicine.  He still has some soreness but is not taking any narcotics. He denies anginal pain, shortness of breath, and peripheral edema. He walked about half a mile yesterday without any issues although he said he did go at a relatively slow pace.  Past Medical History:  Diagnosis Date  . Acute diastolic congestive heart failure ([employer])   . Aortic valve stenosis   . Atrial fibrillation ([place]) - post-op CABG    04/2016 CHA2DS2VAS score = 5  . Atypical nevi   . Coronary artery disease   . Depression    "years ago"  . Diabetes mellitus   . GERD (gastroesophageal reflux disease)   . History of kidney stones   . Hyperlipidemia    hx of transaminitis secondary to statin and he has decided not to use statins secondary to potential side effects.  . Hypertension   . Nephrolithiasis   . Osteoarthritis, knee   . Sleep apnea     Central apnea. Not using cpap  . Stroke [facility]) 2007  . Transaminitis     Statin-induced  . 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 2-D echo was normal no embolic source found.     Current Outpatient Prescriptions  Medication Sig Dispense Refill  . acetaminophen (TYLENOL) 325 MG tablet Take 2 tablets (650 mg total) by mouth every 4 (four) hours as needed for headache or mild pain.    Marland Kitchen amiodarone (PACERONE) 200 MG tablet Take 1 tablet (200 mg total) by mouth daily. 30 tablet 6  . Ascorbic Acid (VITAMIN C PO) Take 1 tablet by mouth daily.    Marland Kitchen aspirin EC 325 MG EC tablet Take 1 tablet (325 mg total) by mouth daily. 30 tablet 0  . carvedilol (COREG) 3.125 MG tablet Take 1 tablet (3.125 mg total) by mouth 2 (two) times daily with a meal. 60 tablet 3  . colchicine 0.6 MG tablet Take 1 tablet (0.6 mg total) by mouth 2 (two) times daily. 60 tablet 6  . furosemide (LASIX) 40 MG tablet Take 1 tablet (40 mg total) by mouth 2 (two) times daily. 60 tablet 6  . gabapentin (NEURONTIN) 100 MG capsule Take 1 capsule (100 mg total) by mouth daily. 30 capsule 0  . glipiZIDE (GLUCOTROL) 10 MG tablet Take 1 tablet (  10 mg total) by mouth 2 (two) times daily before a meal. 60 tablet 6  . glucose blood (ACCU-CHEK ACTIVE STRIPS) test strip Use as instructed 100 each 12  . Lancets (ONETOUCH ULTRASOFT) lancets Use as instructed 100 each 12  . metFORMIN (GLUCOPHAGE-XR) 500 MG 24 hr tablet Take 1,000 mg by mouth 2 (two) times daily.    . nitroGLYCERIN (NITROSTAT) 0.4 MG SL tablet Place 1 tablet (0.4 mg total) under the tongue every 5 (five) minutes as needed for chest pain. 25 tablet 1  . potassium chloride SA (K-DUR,KLOR-CON) 20 MEQ tablet Take 1 tablet (20 mEq total) by mouth daily. 30 tablet 6  . senna-docusate (SENOKOT-S) 8.6-50 MG tablet Take 1 tablet by mouth at bedtime. 30  tablet 6  . tamsulosin (FLOMAX) 0.4 MG CAPS capsule Take 1 capsule (0.4 mg total) by mouth daily after supper. 30 capsule 3  . warfarin (COUMADIN) 5 MG tablet Take 1 tablet (5 mg total) by mouth daily. (Patient taking differently: Take 2.5-5 mg by mouth daily. Take 2.5mg  on Mon and Friday. Then take 5mg  rest of days per patient) 30 tablet 6  . atorvastatin (LIPITOR) 80 MG tablet Take 1 tablet (80 mg total) by mouth daily at 6 PM. (Patient not taking: Reported on 04/16/2016) 90 tablet 3  . oxyCODONE (OXY IR/ROXICODONE) 5 MG immediate release tablet Take 1-2 tablets (5-10 mg total) by mouth every 3 (three) hours as needed for severe pain. (Patient not taking: Reported on 05/09/2016) 30 tablet 0  . traMADol (ULTRAM) 50 MG tablet Take 1-2 tablets (50-100 mg total) by mouth every 6 (six) hours as needed (mild pain). (Patient not taking: Reported on 05/09/2016) 30 tablet 0   No current facility-administered medications for this visit.     Physical Exam BP 117/78 (BP Location: Right Arm, Patient Position: Sitting, Cuff Size: Large)   Pulse 81   Resp 16   Ht 5\' 8"  (1.727 m)   Wt 200 lb (90.7 kg)   SpO2 98% Comment: ON RA  BMI 30.69 kg/m  66 year old man in no acute distress Alert and oriented 3 with no focal deficits Cardiac regular rate and rhythm normal S1 and S2, no murmur Lungs clear with equal breath sounds bilaterally Sternum stable, incision healing well Leg incisions healing well, no peripheral edema  Diagnostic Tests: CHEST  2 VIEW  COMPARISON:  05/03/2016  FINDINGS: Prior CABG and valve replacement. Lungs are clear. Heart is upper limits normal in size. No effusions or pneumothorax. No acute bony abnormality.  IMPRESSION: No active cardiopulmonary disease.   Electronically Signed   By: Rolm Baptise M.D.   On: 05/09/2016 09:46 I personally reviewed the chest x-ray and concur with the findings noted above  Impression: Mark Cabrera is a 66 yo man who underwent aortic valve  replacement with a bioprosthetic valve and coronary bypass grafting 2 on 03/30/2016. He was in atrial fibrillation when he presented on the morning of surgery, so a left atrial clip was placed as well. He had some additional atrial fibrillation in the postoperative course.  Post pericardiotomy syndrome - He presented back with congestive failure and large pleural and pericardial effusions. He had a left thoracentesis and a subxiphoid pericardial window. He was treated with a couple of rounds of steroids and colchicine after that. He did well following the subxiphoid window. Today he has no signs of congestive failure and really looks better than he did preoperatively. His chest x-ray showed no evidence for pleural or pericardial effusion.  Postoperative pain- not requiring narcotics.  His wounds are healing well  He should avoid heavy lifting (greater than 10 pounds) for the next 2 weeks. After that his activities are unrestricted.  Plan: Follow-up is scheduled with Dr. [name]  I will be happy to see Mr. Borey back at any time if I can be of any further assistance with his care.  [name], MD [facility] [phone]

## 2016-05-10 ENCOUNTER — Ambulatory Visit (INDEPENDENT_AMBULATORY_CARE_PROVIDER_SITE_OTHER): Payer: Medicare Other | Admitting: Internal Medicine

## 2016-05-10 ENCOUNTER — Encounter: Payer: Self-pay | Admitting: Dietician

## 2016-05-10 DIAGNOSIS — N489 Disorder of penis, unspecified: Secondary | ICD-10-CM

## 2016-05-10 DIAGNOSIS — Z8679 Personal history of other diseases of the circulatory system: Secondary | ICD-10-CM | POA: Diagnosis not present

## 2016-05-10 DIAGNOSIS — N4889 Other specified disorders of penis: Secondary | ICD-10-CM

## 2016-05-10 DIAGNOSIS — G6289 Other specified polyneuropathies: Secondary | ICD-10-CM

## 2016-05-10 DIAGNOSIS — I69398 Other sequelae of cerebral infarction: Secondary | ICD-10-CM | POA: Diagnosis not present

## 2016-05-10 DIAGNOSIS — IMO0002 Reserved for concepts with insufficient information to code with codable children: Secondary | ICD-10-CM

## 2016-05-10 DIAGNOSIS — Z951 Presence of aortocoronary bypass graft: Secondary | ICD-10-CM | POA: Diagnosis not present

## 2016-05-10 DIAGNOSIS — M792 Neuralgia and neuritis, unspecified: Secondary | ICD-10-CM

## 2016-05-10 NOTE — Progress Notes (Signed)
CC: penile discomfort  HPI:  Mark Cabrera is a 66 y.o. M with medical history as outlined below who presents to the acute care clinic today for evaluation of penile discomfort. The patient reports that 4 days ago he developed pain at the tip of his penis which ran the full length of his urethra. He describes it as feeling like he still has a foley catheter in place (was hospitalized recently for several weeks for CABG and subsequent pericarditis, both admissions had foley placed). In addition, he also reports pain and erythema on the glans extending down about 1 inch onto his foreskin. In addition, he reports that he has had 2 episodes of non-ejaculatory orgasm over the past 4 days as well. Denies any similar history of the above. He does report a several year history of progressive difficulty starting stream, difficulty reaching maximum erection and also nocturia.  Denies any dysuria, hematuria, fevers, chills, pelvic pain, abdominal pain. Does endorse occasional constipation since discharge, relieved by laxatives. He is not sexually active and denies any history of STI, ulcers or genital lesions.   Past Medical History:  Diagnosis Date  . Acute diastolic congestive heart failure ([place])   . Aortic valve stenosis   . Atrial fibrillation ([place]) - post-op CABG    04/2016 CHA2DS2VAS score = 5  . Atypical nevi   . Coronary artery disease   . Depression    "years ago"  . Diabetes mellitus   . GERD (gastroesophageal reflux disease)   . History of kidney stones   . Hyperlipidemia    hx of transaminitis secondary to statin and he has decided not to use statins secondary to potential side effects.  . Hypertension   . Nephrolithiasis   . Osteoarthritis, knee   . Sleep apnea     Central apnea. Not using cpap  . Stroke [facility]) 2007  . Transaminitis     Statin-induced  . 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 MRI of the brain  2007 showed acute left lateral medullary infarct in the distribution of left posterior inferior cerebral artery , narrowing of the left vertebral with severe diminution of flow or acute occlusion. 2-D echo was normal no embolic source found.   Review of Systems: Review of Systems  Constitutional: Negative for chills, fever and malaise/fatigue.  Respiratory: Negative for cough and shortness of breath.   Cardiovascular: Negative for chest pain, palpitations and leg swelling.  Gastrointestinal: Positive for constipation. Negative for abdominal pain, blood in stool, diarrhea, melena, nausea and vomiting.  Genitourinary: Negative for dysuria, flank pain, hematuria and urgency.  Neurological: Negative for dizziness, weakness and headaches.   Physical Exam: Physical Exam  Constitutional: He appears well-developed and well-nourished. He appears distressed.  HENT:  Head: Normocephalic and atraumatic.  Cardiovascular: Normal rate and regular rhythm.  Exam reveals no gallop and no friction rub.   Pulmonary/Chest: Effort normal and breath sounds normal. No respiratory distress. He has no wheezes.  Abdominal: Soft. Bowel sounds are normal. He exhibits no distension. There is no tenderness. Hernia confirmed negative in the right inguinal area and confirmed negative in the left inguinal area.  Genitourinary: Testes normal and penis normal. Cremasteric reflex is present. Uncircumcised. No phimosis, paraphimosis, hypospadias, penile erythema or penile tenderness. No discharge found.  Lymphadenopathy: No inguinal adenopathy noted on the right or left side.  Skin: Skin is warm and dry. No rash noted. He is not diaphoretic.  Vitals:   05/10/16 1029  BP: 138/81  Pulse: 83  Temp: 98.1 F (36.7 C)  TempSrc: Oral  SpO2: 100%  Weight: 209 lb 12.8 oz (95.2 kg)   Assessment & Plan:   See Encounters Tab for problem based charting.  Patient seen with Dr. Eppie Gibson

## 2016-05-10 NOTE — Patient Instructions (Signed)
It was a pleasure meeting you today [name]. Thank you for coming in to the clinic to be evaluated for your complaint today. It does not seem to be an infection which is reassuring. We would like to observe you and see how you do over the next several weeks. If your symptoms were to progress, please call the clinic and let us know! Otherwise, come back in 3-4 weeks!

## 2016-05-11 ENCOUNTER — Telehealth: Payer: Self-pay | Admitting: *Deleted

## 2016-05-11 DIAGNOSIS — Z48812 Encounter for surgical aftercare following surgery on the circulatory system: Secondary | ICD-10-CM | POA: Diagnosis not present

## 2016-05-11 DIAGNOSIS — I350 Nonrheumatic aortic (valve) stenosis: Secondary | ICD-10-CM | POA: Diagnosis not present

## 2016-05-11 MED ORDER — GABAPENTIN 100 MG PO CAPS: 100.0000 mg | ORAL_CAPSULE | Freq: Two times a day (BID) | ORAL | 0 refills | Status: DC

## 2016-05-11 NOTE — Telephone Encounter (Signed)
[facility] calls and states that pt has stated to him that dr molt told him to increase gabapentin to 100mg  twice daily at his 2/28 appt, I do not see in visit note anything about this and no change in medlist, please advise P SREE JESH [phone]

## 2016-05-12 ENCOUNTER — Ambulatory Visit (INDEPENDENT_AMBULATORY_CARE_PROVIDER_SITE_OTHER): Payer: Medicare Other | Admitting: Cardiology

## 2016-05-12 ENCOUNTER — Encounter: Payer: Self-pay | Admitting: Cardiology

## 2016-05-12 VITALS — BP 110/76 | HR 82 | Ht 68.0 in | Wt 210.8 lb

## 2016-05-12 DIAGNOSIS — Z48812 Encounter for surgical aftercare following surgery on the circulatory system: Secondary | ICD-10-CM | POA: Diagnosis not present

## 2016-05-12 DIAGNOSIS — I350 Nonrheumatic aortic (valve) stenosis: Secondary | ICD-10-CM

## 2016-05-12 DIAGNOSIS — Z952 Presence of prosthetic heart valve: Secondary | ICD-10-CM | POA: Diagnosis not present

## 2016-05-12 DIAGNOSIS — M792 Neuralgia and neuritis, unspecified: Secondary | ICD-10-CM

## 2016-05-12 DIAGNOSIS — I313 Pericardial effusion (noninflammatory): Secondary | ICD-10-CM | POA: Diagnosis not present

## 2016-05-12 DIAGNOSIS — N4889 Other specified disorders of penis: Secondary | ICD-10-CM | POA: Insufficient documentation

## 2016-05-12 DIAGNOSIS — I2510 Atherosclerotic heart disease of native coronary artery without angina pectoris: Secondary | ICD-10-CM | POA: Diagnosis not present

## 2016-05-12 DIAGNOSIS — J90 Pleural effusion, not elsewhere classified: Secondary | ICD-10-CM

## 2016-05-12 DIAGNOSIS — I3139 Other pericardial effusion (noninflammatory): Secondary | ICD-10-CM

## 2016-05-12 DIAGNOSIS — Z951 Presence of aortocoronary bypass graft: Secondary | ICD-10-CM

## 2016-05-12 DIAGNOSIS — I4819 Other persistent atrial fibrillation: Secondary | ICD-10-CM

## 2016-05-12 DIAGNOSIS — I481 Persistent atrial fibrillation: Secondary | ICD-10-CM | POA: Diagnosis not present

## 2016-05-12 HISTORY — DX: Neuralgia and neuritis, unspecified: M79.2

## 2016-05-12 MED ORDER — FUROSEMIDE 40 MG PO TABS: ORAL_TABLET | ORAL | 6 refills | Status: DC

## 2016-05-12 NOTE — Patient Instructions (Addendum)
Medication Instructions:  Your physician has recommended you make the following change in your medication:  1.  INCREASE the Lasix to 40 mg taking 2 tablets in the morning and 1 tablet in the afternoon X's 2 days then go back to 1 tablet twice a day  Labwork: None ordered  Testing/Procedures: None ordered  Follow-Up: Your physician recommends that you schedule a follow-up appointment in: [place]   Any Other Special Instructions Will Be Listed Below (If Applicable).   If you need a refill on your cardiac medications before your next appointment, please call your pharmacy.

## 2016-05-12 NOTE — Assessment & Plan Note (Signed)
Assessment: 66 year old male here with 4 day history of penile discomfort, described as "feeling like I still have the foley in" following a several week stent with a catheter. This is associated with erythema and increased warmth of the tip of his glans extending approximately 1 inch onto his foreskin in the mornings however this resolves by approximately 11 am. In addition, he has had 2 non-ejaculatory orgasms during the preceding 4 days as well. Denies any history of similar events to the above however he does endorse a history concerning for BPH (several year history of progressive difficulty starting stream, nocturia). Also endorses difficulty maintaining erection. UA was obtained by his cardiologist the day prior to my evaluation which was negative for infection. Physical exam does not reveal ANY visible or palpable abnormality.    Plan: Unclear etiology/process. Physical exam is normal and UA was without infection. He is afebrile. Offered PSA testing and prostate exam however patient deferred. Reassured patient and he is agreeable to observation for several weeks to see if current complaint resolves spontaneously or declares another condition.  -Consider PSA -Return precautions given

## 2016-05-12 NOTE — Assessment & Plan Note (Signed)
Assessment: Patient reports right-sided "burning pain" of his arm, leg, chest and back following a prior CVA. For this, he has been taking gabapentin. Chart review shows that he is presently prescribed 100mg  once daily. He reports he has been on higher doses of this medication with better control of his symptoms.  Plan: Will increase Gabapentin today to 100mg  BID. Consider increasing further if not satisfactory improvement.

## 2016-05-12 NOTE — Progress Notes (Signed)
Cardiology Office Note   Date:  05/12/2016   ID:  Windle, Goncalves [birth year], MRN [id number]  PCP:  [name] Kerbs, DO  Cardiologist:  Dr [name]    Chief Complaint  Patient presents with  . Hospitalization Follow-up      History of Present Illness: Mark Cabrera is a 66 y.o. male who presents for post hospitalization history of AVR 03/31/16 with bovine pericardial valve, left atrial appendage clipping and bypass with SVG to OM1and SVG to PDAper Dr. Roxan Hockey. He did have atrial fib post-op but was in sinus at time of discharge- readmitted 04/16/16 with a fib and volume overload.   He was given IV lasix, diltiazem and amiodarone which returned patient to sinus rhythm, plan for long term anti-coagulation.  An echo was done on 04/17/16 which showed a large left pleural effusion and pericardial effusion. Thoracentesis was done on 04/18/16 with 1.4L of fluid removed.He was noted to have post op anemia at 7.5 on 04/18/16, 1 unit pRBCs transfused.  Steroid taper and cholchicine did not ease subscapular chest pain, limited echo repeated on 04/20/2016.   04/21/2016 for subxiphoid pericardial window and transesophageal echocardiogram as surgical intervention, 500 ml serous fluid evacuated.  Repeat echo done on 04/25/2016 showed circumferential fluid to pericardium and bilateral pleural effusion L > R. Drain was removed on 04/26/2016. He felt weak on 04/27/2016 and therefore he was kept in the hospital an extra day and cardiac rehab was asked to ambulate patient  He was seen again in ER 05/03/16 for 10 lb wt gain.  He also had SOB. His discharge weight was 201 pounds. In ER  his weight was 197 pounds although on different  scale.  SR with 1st degree block on EKG, INR was 2.8 on the 26th of Feb.    Today he does feel volume overloaded, not SOB but some edema.  He admits to taking an extra lasix that helped.  He has not pain, has seen Dr. Roxan Hockey back for follow up and was released.  No irregular HR.  He  has had increased finger numbness on rt- increased from his chronic numbness and tingling since CVA 10 yrs ago.  PCP increased neurontin.     His appetite is good, he is watching his salt.  Is feeling well.      Past Medical History:  Diagnosis Date  . Acute diastolic congestive heart failure ([place])   . Aortic valve stenosis   . Atrial fibrillation ([place]) - post-op CABG    04/2016 CHA2DS2VAS score = 5  . Atypical nevi   . Coronary artery disease   . Depression    "years ago"  . Diabetes mellitus   . GERD (gastroesophageal reflux disease)   . History of kidney stones   . Hyperlipidemia    hx of transaminitis secondary to statin and he has decided not to use statins secondary to potential side effects.  . Hypertension   . Nephrolithiasis   . Osteoarthritis, knee   . Sleep apnea     Central apnea. Not using cpap  . Stroke [facility]) 2007  . Transaminitis     Statin-induced  . 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 2-D echo was normal no embolic source  found.    Past Surgical History:  Procedure Laterality Date  . AORTIC VALVE REPLACEMENT N/A 03/30/2016   Procedure: AORTIC VALVE REPLACEMENT (AVR);  Surgeon: [name], MD;  Location: [place];  Service: Open Heart Surgery;  Laterality: N/A;  . CARDIAC CATHETERIZATION N/A 03/15/2016   Procedure: Right/Left Heart Cath and Coronary Angiography;  Surgeon: Burnell Blanks, MD;  Location: [place] CV [facility];  Service: Cardiovascular;  Laterality: N/A;  . CLIPPING OF ATRIAL APPENDAGE N/A 03/30/2016   Procedure: CLIPPING OF LEFT ATRIAL APPENDAGE;  Surgeon: [name], MD;  Location: [place];  Service: Open Heart Surgery;  Laterality: N/A;  . CORONARY  ARTERY BYPASS GRAFT N/A 03/30/2016   Procedure: CORONARY ARTERY BYPASS GRAFTING (CABG) Times Two;  Surgeon: [name], MD;  Location: [place];  Service: Open Heart Surgery;  Laterality: N/A;  . KNEE ARTHROSCOPY W/ PARTIAL MEDIAL MENISCECTOMY  05/12/2005   right, performed by Dr. French Ana for torn medial meniscus.  Marland Kitchen ROTATOR CUFF REPAIR Right 2016  . SUBXYPHOID PERICARDIAL WINDOW N/A 04/21/2016   Procedure: SUBXYPHOID PERICARDIAL WINDOW;  Surgeon: [name], MD;  Location: [place];  Service: Thoracic;  Laterality: N/A;  . TEE WITHOUT CARDIOVERSION N/A 03/30/2016   Procedure: TRANSESOPHAGEAL ECHOCARDIOGRAM (TEE);  Surgeon: [name], MD;  Location: [place];  Service: Open Heart Surgery;  Laterality: N/A;  . TEE WITHOUT CARDIOVERSION N/A 04/21/2016   Procedure: TRANSESOPHAGEAL ECHOCARDIOGRAM (TEE);  Surgeon: [name], MD;  Location: [place];  Service: Thoracic;  Laterality: N/A;     Current Outpatient Prescriptions  Medication Sig Dispense Refill  . acetaminophen (TYLENOL) 325 MG tablet Take 2 tablets (650 mg total) by mouth every 4 (four) hours as needed for headache or mild pain.    Marland Kitchen amiodarone (PACERONE) 200 MG tablet Take 1 tablet (200 mg total) by mouth daily. 30 tablet 6  . Ascorbic Acid (VITAMIN C PO) Take 1 tablet by mouth daily.    Marland Kitchen aspirin EC 325 MG EC tablet Take 1 tablet (325 mg total) by mouth daily. 30 tablet 0  . carvedilol (COREG) 3.125 MG tablet Take 1 tablet (3.125 mg total) by mouth 2 (two) times daily with a meal. 60 tablet 3  . colchicine 0.6 MG tablet Take 1 tablet (0.6 mg total) by mouth 2 (two) times daily. 60 tablet 6  . furosemide (LASIX) 40 MG tablet Take 2 tablets by mouth in the a.m and take 1 tablet by mouth in the p.m. For 2 days then go back to taking 1 tablet by mouth twice day 72 tablet 6  . gabapentin (NEURONTIN) 100 MG capsule Take 1 capsule (100 mg total) by mouth 2 (two) times daily. 60 capsule 0  . glipiZIDE (GLUCOTROL) 10 MG  tablet Take 1 tablet (10 mg total) by mouth 2 (two) times daily before a meal. 60 tablet 6  . glucose blood (ACCU-CHEK ACTIVE STRIPS) test strip Use as instructed 100 each 12  . Lancets (ONETOUCH ULTRASOFT) lancets Use as instructed 100 each 12  . metFORMIN (GLUCOPHAGE-XR) 500 MG 24 hr tablet Take 1,000 mg by mouth 2 (two) times daily.    . nitroGLYCERIN (NITROSTAT) 0.4 MG SL tablet Place 1 tablet (0.4 mg total) under the tongue every 5 (five) minutes as needed for chest pain. 25 tablet 1  . oxyCODONE (OXY IR/ROXICODONE) 5 MG immediate release tablet Take 1-2 tablets (5-10 mg total) by mouth every 3 (three) hours as needed for severe pain. 30 tablet 0  .  potassium chloride SA (K-DUR,KLOR-CON) 20 MEQ tablet Take 1 tablet (20 mEq total) by mouth daily. 30 tablet 6  . sennosides-docusate sodium (SENOKOT-S) 8.6-50 MG tablet Take 1 tablet by mouth as needed for constipation.    . tamsulosin (FLOMAX) 0.4 MG CAPS capsule Take 1 capsule (0.4 mg total) by mouth daily after supper. 30 capsule 3  . traMADol (ULTRAM) 50 MG tablet Take 1-2 tablets (50-100 mg total) by mouth every 6 (six) hours as needed (mild pain). 30 tablet 0  . warfarin (COUMADIN) 5 MG tablet Take 1 tablet (5 mg total) by mouth daily. (Patient taking differently: Take 2.5-5 mg by mouth daily. Take 2.5mg  on Mon and Friday. Then take 5mg  rest of days per patient) 30 tablet 6   No current facility-administered medications for this visit.     Allergies:   Crab (diagnostic); Penicillins; Statins; and Morphine and related    Social History:  The patient  reports that he has never smoked. He has never used smokeless tobacco. He reports that he drinks alcohol. He reports that he does not use drugs.   Family History:  The patient's family history includes Alcohol abuse in his father; Diabetes in his mother; Hypertension in his mother.    ROS:  General:no colds or fevers, no weight changes Skin:no rashes or ulcers HEENT:no blurred vision, no  congestion CV:see HPI PUL:see HPI GI:no diarrhea constipation or melena, no indigestion GU:no hematuria, no dysuria MS:no joint pain, no claudication Neuro:no syncope, no lightheadedness Endo:no diabetes, no thyroid disease  Wt Readings from Last 3 Encounters:  05/12/16 210 lb 12.8 oz (95.6 kg)  05/10/16 209 lb 12.8 oz (95.2 kg)  05/09/16 200 lb (90.7 kg)     PHYSICAL EXAM: VS:  BP 110/76   Pulse 82   Ht 5\' 8"  (1.727 m)   Wt 210 lb 12.8 oz (95.6 kg)   BMI 32.05 kg/m  , BMI Body mass index is 32.05 kg/m. General:Pleasant affect, NAD Skin:Warm and dry, brisk capillary refill HEENT:normocephalic, sclera clear, mucus membranes moist Neck:supple, no JVD, no bruits  Heart:S1S2 RRR without murmur, gallup, rub or click Lungs:clear without rales, rhonchi, or wheezes [id number], non tender, + BS, do not palpate liver spleen or masses Ext:tr to 1+ lower ext edema, 2+ pedal pulses, 2+ radial pulses Neuro:alert and oriented X 3, MAE, follows commands, + facial symmetry    EKG:  EKG is ordered today. The ekg ordered today demonstrates SR with 1st degree AV block no acute changes.     Recent Labs: 04/16/2016: TSH 2.030 04/21/2016: Magnesium 1.8 04/23/2016: ALT 21 05/03/2016: B Natriuretic Peptide 128.1; BUN 19; Creatinine, Ser 1.23; Hemoglobin 11.5; Platelets 443; Potassium 4.5; Sodium 135    Lipid Panel    Component Value Date/Time   CHOL 206 (H) 04/25/2013 1535   TRIG 241 (H) 04/25/2013 1535   HDL 47 04/25/2013 1535   CHOLHDL 4.4 04/25/2013 1535   VLDL 48 (H) 04/25/2013 1535   LDLCALC 111 (H) 04/25/2013 1535       Other studies Reviewed: Additional studies/ records that were reviewed today include: Marland Kitchen Most recent echo after pericardial centesis 04/25/16 Study Conclusions  - Left ventricle: The cavity size was moderately dilated. There was   moderate concentric hypertrophy. Systolic function was normal.   The estimated ejection fraction was in the range of 50% to 55%.    Wall motion was normal; there were no regional wall motion   abnormalities. - Aortic valve: A bovine bioprosthesis was present. Mean gradient   (  S): 9 mm Hg. - Mitral valve: There was mild regurgitation. - Left atrium: The atrium was severely dilated. Anterior-posterior   dimension: 56 mm. - Tricuspid valve: There was trivial regurgitation. - Pericardium, extracardiac: A small, free-flowing pericardial   effusion was identified circumferential to the heart. The fluid   had no internal echoes. There was a right pleural effusion. There   was a large left pleural effusion.   POST-OPERATIVE DIAGNOSIS:  PERICARDIAL EFFUSION PROCEDURE:  Procedure(s): SUBXYPHOID PERICARDIAL WINDOW (N/A) -500 cc Serous Fluid TRANSESOPHAGEAL ECHOCARDIOGRAM (TEE) (N/A)  SURGEON:  Surgeon(s) and Role:    * [name], MD - Primary   CABG AND AVR PRE-OPERATIVE DIAGNOSIS:  SEVERE Aortic Valve Stenosis Coronary Artery Disease  POST-OPERATIVE DIAGNOSIS:  SEVERE Aortic Valve Stenosis Coronary Artery Disease  PROCEDURE:  Procedure(s): CORONARY ARTERY BYPASS GRAFTING (CABG) (N/A) AORTIC VALVE REPLACEMENT (AVR) (N/A) TRANSESOPHAGEAL ECHOCARDIOGRAM (TEE) (N/A) CLIPPING OF ATRIAL APPENDAGE (N/A)  ASSESSMENT AND PLAN:  1.  Pericardial effusion resolved with pericardial window.  No SOB  2.  Pl effusion resolved with ericardial centesis and most recent CXR stable.   3. Persistent a fib post op and short run pre op.  On amiodarone and maintaining SR on coumadin with therapeutic levels.  (clipping of atrial appendage done in surgery.)  Continue amiodarone.   4. AS with AVR 23 mm [facility] Ease bovine pericardial valve.03/30/16  5. CAD with 2 vessel CABG SVG to PDA and SVG to OM1. 03/30/16- pt is walking daily, but does not believe he will attend cardiac rehab.   6. HLD intolerance to statins, would be willing to try but lipitor did cause aches and he prefers not to take.  Will leave for now  as pt is finally doing well   7. HTN controlled.    8. DM-2 per PCP  9. Neuropathy per PCP.    10.  Hx CVA.    Pt currently living with his brother but does plan to return to his life without electricity or running water.  He does plan to have a cell phone.    Current medicines are reviewed with the patient today.  The patient Has no concerns regarding medicines.  The following changes have been made:  See above Labs/ tests ordered today include:see above  Disposition:   FU:  see above  Signed, Cecilie Kicks, NP  05/12/2016 2:34 PM    [facility] [place], [place], [place], [place] Phone: [phone]; Fax: [phone]

## 2016-05-14 NOTE — Progress Notes (Signed)
Patient ID: Mark Cabrera, male   DOB: June 13, 1950, 66 y.o.   MRN: [id number]  I saw and evaluated the patient.  I personally confirmed the key portions of Dr. Rober Minion history and exam and reviewed pertinent patient test results.  The assessment, diagnosis, and plan were formulated together and I agree with the documentation in the resident's note.

## 2016-05-15 ENCOUNTER — Ambulatory Visit (INDEPENDENT_AMBULATORY_CARE_PROVIDER_SITE_OTHER): Payer: Medicare Other | Admitting: Internal Medicine

## 2016-05-15 ENCOUNTER — Telehealth: Payer: Self-pay | Admitting: *Deleted

## 2016-05-15 DIAGNOSIS — I4819 Other persistent atrial fibrillation: Secondary | ICD-10-CM

## 2016-05-15 DIAGNOSIS — Z952 Presence of prosthetic heart valve: Secondary | ICD-10-CM

## 2016-05-15 DIAGNOSIS — Z5181 Encounter for therapeutic drug level monitoring: Secondary | ICD-10-CM

## 2016-05-15 DIAGNOSIS — Z48812 Encounter for surgical aftercare following surgery on the circulatory system: Secondary | ICD-10-CM | POA: Diagnosis not present

## 2016-05-15 DIAGNOSIS — I481 Persistent atrial fibrillation: Secondary | ICD-10-CM

## 2016-05-15 DIAGNOSIS — I350 Nonrheumatic aortic (valve) stenosis: Secondary | ICD-10-CM | POA: Diagnosis not present

## 2016-05-15 LAB — POCT INR: INR: 1.9

## 2016-05-15 NOTE — Telephone Encounter (Signed)
-----   Message from Jeanann Lewandowsky, [place] sent at 05/12/2016  2:12 PM EST ----- Suzan Nailer, NP, would like for this pt to see Dr. [name] in 4 weeks.  Thanks!

## 2016-05-15 NOTE — Telephone Encounter (Signed)
New Message     Pt called back returning Pat's call

## 2016-05-15 NOTE — Telephone Encounter (Signed)
I spoke with pt and scheduled him to see Dr. [name] on 06/21/16 at 10:40.  Appt calendar mailed to him

## 2016-05-15 NOTE — Telephone Encounter (Signed)
I placed call to pt to schedule appt. Left message to call back 

## 2016-05-16 DIAGNOSIS — Z48812 Encounter for surgical aftercare following surgery on the circulatory system: Secondary | ICD-10-CM | POA: Diagnosis not present

## 2016-05-16 DIAGNOSIS — I350 Nonrheumatic aortic (valve) stenosis: Secondary | ICD-10-CM | POA: Diagnosis not present

## 2016-05-17 ENCOUNTER — Telehealth: Payer: Self-pay | Admitting: Cardiovascular Disease

## 2016-05-17 NOTE — Telephone Encounter (Signed)
[name] is calling because a order for Mark Cabrera and Plan of Care for 04/11/16 thru 06/09/16 , order # [id number] was faxed over and all of it did not come through and she is asking if you could refax it to [phone]. Please call if you have any questions . Thanks

## 2016-05-17 NOTE — Telephone Encounter (Signed)
[place] orders refaxed to [phone]

## 2016-05-18 ENCOUNTER — Telehealth (HOSPITAL_COMMUNITY): Payer: Self-pay | Admitting: Internal Medicine

## 2016-05-18 NOTE — Telephone Encounter (Signed)
Verified Medicare A/B & Medicaid insurance benefits through [employer]port. Medicaid follows Medicare Guidelines,  Faxed over Medicaid form to Dr. Kalman Shan. Reference [phone] & [phone] .... Cherylann Banas

## 2016-05-19 DIAGNOSIS — Z48812 Encounter for surgical aftercare following surgery on the circulatory system: Secondary | ICD-10-CM | POA: Diagnosis not present

## 2016-05-19 DIAGNOSIS — I350 Nonrheumatic aortic (valve) stenosis: Secondary | ICD-10-CM | POA: Diagnosis not present

## 2016-05-22 ENCOUNTER — Ambulatory Visit (INDEPENDENT_AMBULATORY_CARE_PROVIDER_SITE_OTHER): Payer: Self-pay | Admitting: Internal Medicine

## 2016-05-22 DIAGNOSIS — Z952 Presence of prosthetic heart valve: Secondary | ICD-10-CM

## 2016-05-22 DIAGNOSIS — I481 Persistent atrial fibrillation: Secondary | ICD-10-CM

## 2016-05-22 DIAGNOSIS — Z5181 Encounter for therapeutic drug level monitoring: Secondary | ICD-10-CM

## 2016-05-22 DIAGNOSIS — I350 Nonrheumatic aortic (valve) stenosis: Secondary | ICD-10-CM | POA: Diagnosis not present

## 2016-05-22 DIAGNOSIS — I4819 Other persistent atrial fibrillation: Secondary | ICD-10-CM

## 2016-05-22 DIAGNOSIS — Z48812 Encounter for surgical aftercare following surgery on the circulatory system: Secondary | ICD-10-CM | POA: Diagnosis not present

## 2016-05-22 LAB — POCT INR: INR: 2

## 2016-05-24 ENCOUNTER — Telehealth: Payer: Self-pay | Admitting: Cardiovascular Disease

## 2016-05-24 DIAGNOSIS — Z48812 Encounter for surgical aftercare following surgery on the circulatory system: Secondary | ICD-10-CM | POA: Diagnosis not present

## 2016-05-24 DIAGNOSIS — I350 Nonrheumatic aortic (valve) stenosis: Secondary | ICD-10-CM | POA: Diagnosis not present

## 2016-05-24 NOTE — Telephone Encounter (Signed)
I spoke with Mark Cabrera, PT and patient.   Pt's weight 2 days ago was 207 lbs today 213 lbs. Pt's HR and BP normal, did not have exact readings but in the 130/80 and 82 range, HR regular. Pt has bilateral 1+ pitting edema, slight increase in shortness of breath, face looks a swollen.  Pt does admit to eating/drinking more sodium in the last few days. Pt currently takes lasix 40 mg bid.  I reviewed with Cecilie Kicks, NP--per Laura--increase lasix to 80 mg bid for 2 days, then decrease back to usual dose of 40 mg bid, limit sodium in diet, continue to monitor weight and symptoms.   Pt and Sree aware of Laura's recommendations, verbalized understanding.

## 2016-05-24 NOTE — Telephone Encounter (Signed)
New message    Pt c/o swelling: STAT is pt has developed SOB within 24 hours  1. How long have you been experiencing swelling? 2 days  2. Where is the swelling located? Face and legs  3.  Are you currently taking a "fluid pill"? yes  4.  Are you currently SOB? A little   5.  Have you traveled recently? No  Pt wt went from 207 to 213.6 in 2 days

## 2016-05-29 ENCOUNTER — Ambulatory Visit (INDEPENDENT_AMBULATORY_CARE_PROVIDER_SITE_OTHER): Payer: Medicare Other | Admitting: *Deleted

## 2016-05-29 DIAGNOSIS — Z5181 Encounter for therapeutic drug level monitoring: Secondary | ICD-10-CM | POA: Diagnosis not present

## 2016-05-29 DIAGNOSIS — I481 Persistent atrial fibrillation: Secondary | ICD-10-CM

## 2016-05-29 DIAGNOSIS — I2510 Atherosclerotic heart disease of native coronary artery without angina pectoris: Secondary | ICD-10-CM

## 2016-05-29 DIAGNOSIS — Z952 Presence of prosthetic heart valve: Secondary | ICD-10-CM | POA: Diagnosis not present

## 2016-05-29 DIAGNOSIS — I4819 Other persistent atrial fibrillation: Secondary | ICD-10-CM

## 2016-05-29 LAB — POCT INR: INR: 1.9

## 2016-05-29 NOTE — Patient Instructions (Signed)

## 2016-05-31 ENCOUNTER — Telehealth: Payer: Self-pay | Admitting: *Deleted

## 2016-05-31 MED ORDER — ASPIRIN EC 81 MG PO TBEC: 81.0000 mg | DELAYED_RELEASE_TABLET | Freq: Every day | ORAL | 3 refills | Status: DC

## 2016-05-31 NOTE — Telephone Encounter (Signed)
Received message from Dr. [name] for pt to decrease ASA to 81 mg daily. I spoke with pt and gave him this information

## 2016-06-01 ENCOUNTER — Other Ambulatory Visit: Payer: Self-pay | Admitting: Internal Medicine

## 2016-06-01 DIAGNOSIS — M792 Neuralgia and neuritis, unspecified: Secondary | ICD-10-CM

## 2016-06-02 DIAGNOSIS — Z48812 Encounter for surgical aftercare following surgery on the circulatory system: Secondary | ICD-10-CM | POA: Diagnosis not present

## 2016-06-02 DIAGNOSIS — I350 Nonrheumatic aortic (valve) stenosis: Secondary | ICD-10-CM | POA: Diagnosis not present

## 2016-06-05 ENCOUNTER — Ambulatory Visit (INDEPENDENT_AMBULATORY_CARE_PROVIDER_SITE_OTHER): Payer: Medicare Other | Admitting: *Deleted

## 2016-06-05 DIAGNOSIS — I481 Persistent atrial fibrillation: Secondary | ICD-10-CM | POA: Diagnosis not present

## 2016-06-05 DIAGNOSIS — Z5181 Encounter for therapeutic drug level monitoring: Secondary | ICD-10-CM | POA: Diagnosis not present

## 2016-06-05 DIAGNOSIS — I2510 Atherosclerotic heart disease of native coronary artery without angina pectoris: Secondary | ICD-10-CM

## 2016-06-05 DIAGNOSIS — I4819 Other persistent atrial fibrillation: Secondary | ICD-10-CM

## 2016-06-05 DIAGNOSIS — Z952 Presence of prosthetic heart valve: Secondary | ICD-10-CM

## 2016-06-05 LAB — POCT INR: INR: 1.8

## 2016-06-05 MED ORDER — WARFARIN SODIUM 5 MG PO TABS: 5.0000 mg | ORAL_TABLET | ORAL | 2 refills | Status: DC

## 2016-06-06 DIAGNOSIS — Z48812 Encounter for surgical aftercare following surgery on the circulatory system: Secondary | ICD-10-CM | POA: Diagnosis not present

## 2016-06-06 DIAGNOSIS — I350 Nonrheumatic aortic (valve) stenosis: Secondary | ICD-10-CM | POA: Diagnosis not present

## 2016-06-08 ENCOUNTER — Ambulatory Visit (INDEPENDENT_AMBULATORY_CARE_PROVIDER_SITE_OTHER): Payer: Medicare Other | Admitting: Internal Medicine

## 2016-06-08 ENCOUNTER — Encounter: Payer: Self-pay | Admitting: Internal Medicine

## 2016-06-08 VITALS — BP 128/79 | HR 74 | Temp 98.0°F | Ht 68.0 in | Wt 219.9 lb

## 2016-06-08 DIAGNOSIS — Z7982 Long term (current) use of aspirin: Secondary | ICD-10-CM

## 2016-06-08 DIAGNOSIS — N4889 Other specified disorders of penis: Secondary | ICD-10-CM

## 2016-06-08 DIAGNOSIS — R5383 Other fatigue: Secondary | ICD-10-CM | POA: Diagnosis not present
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
## 2016-06-08 NOTE — Patient Instructions (Signed)
It was nice meeting you today!  Please try and clean out your chimney regularly and try and get a carbon monoxide detector for your room; can get a [employer], can call fire department for discounted or free monitor.   If your symptoms continue, you notice swelling, shortness of breath, different chest pain, or other symptoms please come back for an evaluation.

## 2016-06-09 ENCOUNTER — Encounter: Payer: Self-pay | Admitting: Internal Medicine

## 2016-06-09 DIAGNOSIS — R5383 Other fatigue: Secondary | ICD-10-CM | POA: Insufficient documentation

## 2016-06-09 NOTE — Assessment & Plan Note (Signed)
At last visit a month ago, patient had complaints of penile pain and feeling like he still had a foley in place; at the time he endorsed increased warmth and erythema at glans in AM that resolved by 11am. Had 2 non-ejaculatory orgasms over prior 4 days. Has several year history of progressive difficulty of starting stream and nocturia; genital exam at the time was normal; patient declined prostate exam or PSA testing.   Today patient states that his symptoms have resolved on their own; he denies urinary symptoms but does have nocturia due to taking lasix BID. No further workup indicated at this time.
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
## 2016-06-12 ENCOUNTER — Ambulatory Visit (INDEPENDENT_AMBULATORY_CARE_PROVIDER_SITE_OTHER): Payer: Medicare Other

## 2016-06-12 DIAGNOSIS — Z5181 Encounter for therapeutic drug level monitoring: Secondary | ICD-10-CM

## 2016-06-12 DIAGNOSIS — Z952 Presence of prosthetic heart valve: Secondary | ICD-10-CM

## 2016-06-12 DIAGNOSIS — I481 Persistent atrial fibrillation: Secondary | ICD-10-CM | POA: Diagnosis not present

## 2016-06-12 DIAGNOSIS — I2510 Atherosclerotic heart disease of native coronary artery without angina pectoris: Secondary | ICD-10-CM

## 2016-06-12 DIAGNOSIS — I4819 Other persistent atrial fibrillation: Secondary | ICD-10-CM

## 2016-06-12 LAB — POCT INR: INR: 1.6

## 2016-06-12 NOTE — Progress Notes (Signed)
Internal Medicine Clinic Attending  Case discussed with Dr. Sval[place]  at the time of the visit.  We reviewed the resident's history and exam and pertinent patient test results.  I agree with the assessment, diagnosis, and plan of care documented in the resident's note.  

## 2016-06-13 ENCOUNTER — Other Ambulatory Visit: Payer: Self-pay | Admitting: Internal Medicine

## 2016-06-13 DIAGNOSIS — M792 Neuralgia and neuritis, unspecified: Secondary | ICD-10-CM

## 2016-06-13 MED ORDER — GABAPENTIN 100 MG PO CAPS: 100.0000 mg | ORAL_CAPSULE | Freq: Two times a day (BID) | ORAL | 2 refills | Status: DC

## 2016-06-21 ENCOUNTER — Ambulatory Visit (INDEPENDENT_AMBULATORY_CARE_PROVIDER_SITE_OTHER): Payer: Medicare Other | Admitting: Cardiovascular Disease

## 2016-06-21 ENCOUNTER — Encounter: Payer: Self-pay | Admitting: Cardiovascular Disease

## 2016-06-21 ENCOUNTER — Ambulatory Visit (INDEPENDENT_AMBULATORY_CARE_PROVIDER_SITE_OTHER): Payer: Medicare Other | Admitting: *Deleted

## 2016-06-21 VITALS — BP 112/62 | HR 68 | Ht 68.0 in | Wt 223.8 lb

## 2016-06-21 DIAGNOSIS — I2510 Atherosclerotic heart disease of native coronary artery without angina pectoris: Secondary | ICD-10-CM | POA: Diagnosis not present

## 2016-06-21 DIAGNOSIS — Z5181 Encounter for therapeutic drug level monitoring: Secondary | ICD-10-CM

## 2016-06-21 DIAGNOSIS — I481 Persistent atrial fibrillation: Secondary | ICD-10-CM | POA: Diagnosis not present

## 2016-06-21 DIAGNOSIS — Z952 Presence of prosthetic heart valve: Secondary | ICD-10-CM | POA: Diagnosis not present

## 2016-06-21 DIAGNOSIS — I313 Pericardial effusion (noninflammatory): Secondary | ICD-10-CM

## 2016-06-21 DIAGNOSIS — I10 Essential (primary) hypertension: Secondary | ICD-10-CM

## 2016-06-21 DIAGNOSIS — I5033 Acute on chronic diastolic (congestive) heart failure: Secondary | ICD-10-CM

## 2016-06-21 DIAGNOSIS — I350 Nonrheumatic aortic (valve) stenosis: Secondary | ICD-10-CM

## 2016-06-21 DIAGNOSIS — I4819 Other persistent atrial fibrillation: Secondary | ICD-10-CM

## 2016-06-21 DIAGNOSIS — I3139 Other pericardial effusion (noninflammatory): Secondary | ICD-10-CM

## 2016-06-21 LAB — POCT INR: INR: 1.9

## 2016-06-21 MED ORDER — FUROSEMIDE 40 MG PO TABS: ORAL_TABLET | ORAL | 6 refills | Status: DC

## 2016-06-21 NOTE — Patient Instructions (Addendum)
Medication Instructions:  Your physician has recommended you make the following change in your medication: Increase furosemide to 80 mg every morning and 40 mg every afternoon   Labwork: Your physician recommends that you return for lab work in:  1 week.  (BMP).  This is not fasting   Testing/Procedures: Your physician has requested that you have a limited echocardiogram. Echocardiography is a painless test that uses sound waves to create images of your heart. It provides your doctor with information about the size and shape of your heart and how well your heart's chambers and valves are working. This procedure takes approximately one hour. There are no restrictions for this procedure.    Follow-Up: Your physician recommends that you schedule a follow-up appointment in: 6-8 weeks    Any Other Special Instructions Will Be Listed Below (If Applicable).     If you need a refill on your cardiac medications before your next appointment, please call your pharmacy.

## 2016-06-21 NOTE — Progress Notes (Signed)
Chief Complaint  Patient presents with  . Follow-up    dyspnea   History of Present Illness: 67 yo male with history of HTN, HLD, atrial fibrillation, DM, severe aortic stenosis now s/p AVR January 2018, CAD s/p 2V CABG January 2018, prior CVA here today for cardiac follow up. He was admitted to [facility] January 2018 and underwent surgical AVR for AS and 2V CABG (SVG to PDA and SVG to OM1) along with left atrial appendage clipping per Dr. Roxan Hockey. He has post-op atrial fib and volume overload. His post-operative course was also complicated by a large pericardial effusion and pleural effusion. He was readmitted and had thoracentesis and pericardial window. He has been on Lasix with some issues with volume overload post-op. Last echo February 2018 with LVEF=50-55%, moderate LVH, mild MR, AV bioprosthetic valve working well. Dilated Left atrium. Small pleural effusion.   He is here today for follow up. He is feeling well overall. He is having weight gain but no LE edema. Right knee pain, chronic. Some dyspnea. No chest pain. No palpitations.   Primary Care Physician: [name] Kerbs, DO   Past Medical History:  Diagnosis Date  . Acute diastolic congestive heart failure ([place])   . Aortic valve stenosis s/p AVR 2018  . Atrial fibrillation ([place]) - post-op CABG    04/2016 CHA2DS2VAS score = 5  . Atypical nevi   . Coronary artery disease s/p 2 vessel CABG   . Depression    "years ago"  . Diabetes mellitus   . GERD (gastroesophageal reflux disease)   . History of kidney stones   . Hyperlipidemia    hx of transaminitis secondary to statin and he has decided not to use statins secondary to potential side effects.  . Hypertension   . Nephrolithiasis   . Osteoarthritis, knee   . Sleep apnea     Central apnea. Not using cpap  . Stroke [facility]) 2007  . Transaminitis     Statin-induced  . 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 2-D echo was normal no embolic source found.    Past Surgical History:  Procedure Laterality Date  . AORTIC VALVE REPLACEMENT N/A 03/30/2016   Procedure: AORTIC VALVE REPLACEMENT (AVR);  Surgeon: [name], MD;  Location: [place];  Service: Open Heart Surgery;  Laterality: N/A;  . CARDIAC CATHETERIZATION N/A 03/15/2016   Procedure: Right/Left Heart Cath and Coronary Angiography;  Surgeon: Burnell Blanks, MD;  Location: [place] CV LAB;  Service: Cardiovascular;  Laterality: N/A;  . CLIPPING OF ATRIAL APPENDAGE N/A 03/30/2016   Procedure: CLIPPING OF LEFT ATRIAL APPENDAGE;  Surgeon: [name], MD;  Location: [place];  Service: Open Heart Surgery;  Laterality: N/A;  . CORONARY ARTERY BYPASS GRAFT N/A 03/30/2016   Procedure: CORONARY ARTERY BYPASS GRAFTING (CABG) Times Two;  Surgeon: [name], MD;  Location: [place];  Service: Open Heart Surgery;  Laterality: N/A;  . KNEE ARTHROSCOPY W/ PARTIAL MEDIAL MENISCECTOMY  05/12/2005   right, performed by Dr. French Ana for torn medial meniscus.  Marland Kitchen ROTATOR CUFF REPAIR Right 2016  . SUBXYPHOID PERICARDIAL WINDOW N/A 04/21/2016   Procedure: SUBXYPHOID PERICARDIAL WINDOW;  Surgeon: [name], MD;  Location: [place];  Service: Thoracic;  Laterality: N/A;  . TEE WITHOUT CARDIOVERSION N/A 03/30/2016   Procedure: TRANSESOPHAGEAL ECHOCARDIOGRAM (TEE);  Surgeon: [name], MD;  Location: [place];  Service: Open Heart Surgery;  Laterality: N/A;  . TEE WITHOUT CARDIOVERSION N/A 04/21/2016   Procedure: TRANSESOPHAGEAL ECHOCARDIOGRAM (TEE);  Surgeon: [name], MD;  Location: [place];  Service: Thoracic;  Laterality: N/A;    Current Outpatient Prescriptions  Medication Sig Dispense Refill  .  amiodarone (PACERONE) 200 MG tablet Take 1 tablet (200 mg total) by mouth daily. 30 tablet 6  . Ascorbic Acid (VITAMIN C PO) Take 1 tablet by mouth daily.    Marland Kitchen aspirin EC 81 MG tablet Take 1 tablet (81 mg total) by mouth daily. 90 tablet 3  . carvedilol (COREG) 3.125 MG tablet Take 1 tablet (3.125 mg total) by mouth 2 (two) times daily with a meal. 60 tablet 3  . colchicine 0.6 MG tablet Take 1 tablet (0.6 mg total) by mouth 2 (two) times daily. 60 tablet 6  . gabapentin (NEURONTIN) 100 MG capsule Take 1 capsule (100 mg total) by mouth 2 (two) times daily. 60 capsule 2  . glipiZIDE (GLUCOTROL) 10 MG tablet Take 1 tablet (10 mg total) by mouth 2 (two) times daily before a meal. 60 tablet 6  . glucose blood (ACCU-CHEK ACTIVE STRIPS) test strip Use as instructed 100 each 12  . Lancets (ONETOUCH ULTRASOFT) lancets Use as instructed 100 each 12  . metFORMIN (GLUCOPHAGE-XR) 500 MG 24 hr tablet Take 1,000 mg by mouth 2 (two) times daily.    . nitroGLYCERIN (NITROSTAT) 0.4 MG SL tablet Place 1 tablet (0.4 mg total) under the tongue every 5 (five) minutes as needed for chest pain. 25 tablet 1  . potassium chloride SA (K-DUR,KLOR-CON) 20 MEQ tablet Take 1 tablet (20 mEq total) by mouth daily. 30 tablet 6  . sennosides-docusate sodium (SENOKOT-S) 8.6-50 MG tablet Take 1 tablet by mouth as needed for constipation.    . tamsulosin (FLOMAX) 0.4 MG CAPS capsule Take 1 capsule (0.4 mg total) by mouth daily after supper. 30 capsule 3  . warfarin (COUMADIN) 5 MG tablet Take 1 tablet (5 mg total) by mouth as directed. 40 tablet 2  . acetaminophen (TYLENOL) 325 MG tablet Take 2 tablets (650 mg total) by mouth every 4 (four) hours as needed for headache or mild pain. (Patient not taking: Reported on 06/21/2016)    . furosemide (LASIX) 40 MG tablet Take 2 tablets by mouth every morning and one tablet by mouth every afternoon 90 tablet 6  . traMADol (ULTRAM) 50 MG tablet Take 1-2 tablets (50-100 mg total) by mouth every 6  (six) hours as needed (mild pain). (Patient not taking: Reported on 06/21/2016) 30 tablet 0   No current facility-administered medications for this visit.     Allergies  Allergen Reactions  . Crab (Diagnostic) Anaphylaxis  . Penicillins Other (See Comments)    UNSPECIFIED REACTIONS  Has patient had a PCN reaction causing immediate rash, facial/tongue/throat swelling, SOB or lightheadedness with hypotension: Unk Has patient had a PCN reaction causing severe rash involving mucus membranes or skin necrosis: Unk Has patient had a PCN reaction that required hospitalization: Unk Has patient had a PCN reaction occurring within the last 10 years: No If all of the above answers are "NO", then may proceed with Cephalosporin use.   . Statins     UNSPECIFIED REACTION   . Morphine And Related Other (See Comments)    hallucinations    Social  History   Social History  . Marital status: Divorced    Spouse name: N/A  . Number of children: 1  . Years of education: 2y college   Occupational History  .  Medicaid /disability Unemployed    following stroke in 2007   Social History Main Topics  . Smoking status: Never Smoker  . Smokeless tobacco: Never Used  . Alcohol use Yes     Comment: occasional  . Drug use: No  . Sexual activity: Not on file   Other Topics Concern  . Not on file   Social History Narrative   Single but has a girlfriend.    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       Patient has medications disability post stroke.             Family History  Problem Relation Age of Onset  . Hypertension Mother   . Diabetes Mother   . Alcohol abuse Father     Review of Systems:  As stated in the HPI and otherwise negative.   BP 112/62   Pulse 68   Ht 5\' 8"  (1.727 m)   Wt 223 lb 12.8 oz (101.5 kg)   SpO2 99%   BMI 34.03 kg/m   Physical Examination: General: Well developed, well nourished, NAD  HEENT: OP clear, mucus membranes  moist  SKIN: warm, dry. No rashes. Neuro: No focal deficits  Musculoskeletal: Muscle strength 5/5 all ext  Psychiatric: Mood and affect normal  Neck: No JVD, no carotid bruits, no thyromegaly, no lymphadenopathy.  Lungs:Clear bilaterally, no wheezes, rhonci, crackles Cardiovascular: Regular rate and rhythm. No murmurs, gallops or rubs. Abdomen:Soft. Bowel sounds present. Non-tender.  Extremities: No lower extremity edema. Pulses are 2 + in the bilateral DP/PT.  Echo February 2018: Left ventricle: The cavity size was moderately dilated. There was   moderate concentric hypertrophy. Systolic function was normal.   The estimated ejection fraction was in the range of 50% to 55%.   Wall motion was normal; there were no regional wall motion   abnormalities. - Aortic valve: A bovine bioprosthesis was present. Mean gradient   (S): 9 mm Hg. - Mitral valve: There was mild regurgitation. - Left atrium: The atrium was severely dilated. Anterior-posterior   dimension: 56 mm. - Tricuspid valve: There was trivial regurgitation. - Pericardium, extracardiac: A small, free-flowing pericardial   effusion was identified circumferential to the heart. The fluid   had no internal echoes. There was a right pleural effusion. There   was a large left pleural effusion.  EKG:  EKG is not ordered today. The ekg ordered today demonstrates   Recent Labs: 04/16/2016: TSH 2.030 04/21/2016: Magnesium 1.8 04/23/2016: ALT 21 05/03/2016: B Natriuretic Peptide 128.1; BUN 19; Creatinine, Ser 1.23; Hemoglobin 11.5; Platelets 443; Potassium 4.5; Sodium 135   Lipid Panel    Component Value Date/Time   CHOL 206 (H) 04/25/2013 1535   TRIG 241 (H) 04/25/2013 1535   HDL 47 04/25/2013 1535   CHOLHDL 4.4 04/25/2013 1535   VLDL 48 (H) 04/25/2013 1535   LDLCALC 111 (H) 04/25/2013 1535     Wt Readings from Last 3 Encounters:  06/21/16 223 lb 12.8 oz (101.5 kg)  06/08/16 219 lb 14.4 oz (99.7 kg)  05/12/16 210 lb 12.8 oz  (95.6 kg)     Other studies Reviewed: Additional studies/ records that were reviewed today include: . Review of the above records demonstrates:   Assessment and Plan:  1. CAD without angina: No chest pain suggestive of angina. He is s/p 2V CABG January 2018. Will continue ASA and beta blocker. He is not willing to take statins.   2. Pericardial effusion: Resolved post pericardial window. Will repeat limited echo now. Will consider stopping colchicine at 6 week f/u.   3. Aortic stenosis: s/p surgical AVR with bioprosthetic AVR.   4. Atrial fib, paroxysmal: he is now in sinus. Will continue amiodarone and coumadin for now. May stop at f/u in 6 weeks if no evidence of recurrence of atrial fibrillation.   5. HTN: BP controlled. No changes today  6. Hyperlipidemia: He is intolerant to statins. Will consider referral to the lipid clinic.   7. Acute on chronic diastolic CHF: Will increase Lasix to 80 mg am, 40 mg pm. Check BMET next week.   Current medicines are reviewed at length with the patient today.  The patient does not have concerns regarding medicines.  The following changes have been made:  no change  Labs/ tests ordered today include:   Orders Placed This Encounter  Procedures  . Basic Metabolic Panel (BMET)  . ECHOCARDIOGRAM COMPLETE   Disposition:   FU with me in 2 months   Signed, Lauree Chandler, MD 06/21/2016 12:03 PM    [address], [place], [place]  [address] Phone: [phone]; Fax: [phone]

## 2016-06-26 ENCOUNTER — Telehealth (HOSPITAL_COMMUNITY): Payer: Self-pay | Admitting: Internal Medicine

## 2016-06-28 ENCOUNTER — Ambulatory Visit (INDEPENDENT_AMBULATORY_CARE_PROVIDER_SITE_OTHER): Payer: Medicare Other | Admitting: *Deleted

## 2016-06-28 ENCOUNTER — Other Ambulatory Visit: Payer: Medicare Other

## 2016-06-28 DIAGNOSIS — Z952 Presence of prosthetic heart valve: Secondary | ICD-10-CM | POA: Diagnosis not present

## 2016-06-28 DIAGNOSIS — I313 Pericardial effusion (noninflammatory): Secondary | ICD-10-CM | POA: Diagnosis not present

## 2016-06-28 DIAGNOSIS — I2510 Atherosclerotic heart disease of native coronary artery without angina pectoris: Secondary | ICD-10-CM

## 2016-06-28 DIAGNOSIS — I481 Persistent atrial fibrillation: Secondary | ICD-10-CM

## 2016-06-28 DIAGNOSIS — Z5181 Encounter for therapeutic drug level monitoring: Secondary | ICD-10-CM | POA: Diagnosis not present

## 2016-06-28 DIAGNOSIS — I3139 Other pericardial effusion (noninflammatory): Secondary | ICD-10-CM

## 2016-06-28 DIAGNOSIS — I4819 Other persistent atrial fibrillation: Secondary | ICD-10-CM

## 2016-06-28 LAB — BASIC METABOLIC PANEL
BUN / CREAT RATIO: 19 (ref 10–24)
BUN: 21 mg/dL (ref 8–27)
CO2: 24 mmol/L (ref 18–29)
CREATININE: 1.13 mg/dL (ref 0.76–1.27)
Calcium: 9.1 mg/dL (ref 8.6–10.2)
Chloride: 94 mmol/L — ABNORMAL LOW (ref 96–106)
GFR calc non Af Amer: 67 mL/min/{1.73_m2} (ref >59)
GFR, EST AFRICAN AMERICAN: 77 mL/min/{1.73_m2} (ref >59)
Glucose: 240 mg/dL — ABNORMAL HIGH (ref 65–99)
Potassium: 3.5 mmol/L (ref 3.5–5.2)
SODIUM: 137 mmol/L (ref 134–144)

## 2016-06-28 LAB — POCT INR: INR: 2.2

## 2016-06-29 ENCOUNTER — Ambulatory Visit: Payer: Medicare Other

## 2016-06-30 ENCOUNTER — Ambulatory Visit (INDEPENDENT_AMBULATORY_CARE_PROVIDER_SITE_OTHER): Payer: Medicare Other | Admitting: Internal Medicine

## 2016-06-30 VITALS — BP 122/71 | HR 73 | Temp 97.8°F | Ht 68.0 in | Wt 223.7 lb

## 2016-06-30 DIAGNOSIS — M25561 Pain in right knee: Secondary | ICD-10-CM

## 2016-06-30 DIAGNOSIS — K0889 Other specified disorders of teeth and supporting structures: Secondary | ICD-10-CM | POA: Diagnosis not present

## 2016-06-30 DIAGNOSIS — G8929 Other chronic pain: Secondary | ICD-10-CM

## 2016-06-30 DIAGNOSIS — H547 Unspecified visual loss: Secondary | ICD-10-CM

## 2016-06-30 NOTE — Patient Instructions (Signed)
[name],  We will let you know when the referral get scheduled. Please follow up with your PCP in the next 1-2 months.

## 2016-06-30 NOTE — Progress Notes (Signed)
   CC: Knee pain  HPI:  Mr.Arihant P Dunnigan is a 66 y.o. male with a past medical history listed below here today requesting referrals for various issues; knee pain/eye problems and dental pain.  For details of today's visit and the status of his chronic medical issues please refer to the assessment and plan.    Past Medical History:  Diagnosis Date  . Acute diastolic congestive heart failure ([place])   . Aortic valve stenosis s/p AVR 2018  . Atrial fibrillation ([place]) - post-op CABG    04/2016 CHA2DS2VAS score = 5  . Atypical nevi   . Coronary artery disease s/p 2 vessel CABG   . Depression    "years ago"  . Diabetes mellitus   . GERD (gastroesophageal reflux disease)   . History of kidney stones   . Hyperlipidemia    hx of transaminitis secondary to statin and he has decided not to use statins secondary to potential side effects.  . Hypertension   . Nephrolithiasis   . Osteoarthritis, knee   . Sleep apnea     Central apnea. Not using cpap  . Stroke [facility]) 2007  . Transaminitis     Statin-induced  . 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 2-D echo was normal no embolic source found.    Review of Systems:   See HPI  Physical Exam:  Vitals:   06/30/16 1524  BP: 122/71  Pulse: 73  Temp: 97.8 F (36.6 C)  TempSrc: Oral  SpO2: 100%  Weight: 223 lb 11.2 oz (101.5 kg)  Height: 5\' 8"  (1.727 m)   Physical Exam  Constitutional: He is well-developed, well-nourished, and in no distress.  HENT:  Head: Normocephalic.  Mouth/Throat: Dental caries present.  Eyes: Conjunctivae and EOM are normal. Pupils are equal, round, and reactive to light.  Cardiovascular: Normal rate and regular rhythm.     Pulmonary/Chest: Effort normal and breath sounds normal.  Musculoskeletal: Normal range of motion. He exhibits no edema, tenderness or deformity.  Vitals reviewed.    Assessment & Plan:   See Encounters Tab for problem based charting.  Patient discussed with Dr. Beryle Beams

## 2016-07-04 ENCOUNTER — Other Ambulatory Visit: Payer: Self-pay | Admitting: Cardiovascular Disease

## 2016-07-04 ENCOUNTER — Ambulatory Visit (INDEPENDENT_AMBULATORY_CARE_PROVIDER_SITE_OTHER): Payer: Medicare Other

## 2016-07-04 ENCOUNTER — Ambulatory Visit (HOSPITAL_COMMUNITY): Payer: Medicare Other | Attending: Cardiovascular Disease

## 2016-07-04 ENCOUNTER — Other Ambulatory Visit: Payer: Self-pay

## 2016-07-04 DIAGNOSIS — I481 Persistent atrial fibrillation: Secondary | ICD-10-CM

## 2016-07-04 DIAGNOSIS — I313 Pericardial effusion (noninflammatory): Secondary | ICD-10-CM

## 2016-07-04 DIAGNOSIS — I2510 Atherosclerotic heart disease of native coronary artery without angina pectoris: Secondary | ICD-10-CM

## 2016-07-04 DIAGNOSIS — I350 Nonrheumatic aortic (valve) stenosis: Secondary | ICD-10-CM

## 2016-07-04 DIAGNOSIS — I420 Dilated cardiomyopathy: Secondary | ICD-10-CM | POA: Insufficient documentation

## 2016-07-04 DIAGNOSIS — Z5181 Encounter for therapeutic drug level monitoring: Secondary | ICD-10-CM | POA: Diagnosis not present

## 2016-07-04 DIAGNOSIS — I5030 Unspecified diastolic (congestive) heart failure: Secondary | ICD-10-CM | POA: Insufficient documentation

## 2016-07-04 DIAGNOSIS — Z952 Presence of prosthetic heart valve: Secondary | ICD-10-CM

## 2016-07-04 DIAGNOSIS — H547 Unspecified visual loss: Secondary | ICD-10-CM | POA: Insufficient documentation

## 2016-07-04 DIAGNOSIS — K0889 Other specified disorders of teeth and supporting structures: Secondary | ICD-10-CM | POA: Insufficient documentation

## 2016-07-04 DIAGNOSIS — G8929 Other chronic pain: Secondary | ICD-10-CM | POA: Insufficient documentation

## 2016-07-04 DIAGNOSIS — I4819 Other persistent atrial fibrillation: Secondary | ICD-10-CM

## 2016-07-04 DIAGNOSIS — M25569 Pain in unspecified knee: Secondary | ICD-10-CM

## 2016-07-04 DIAGNOSIS — I3139 Other pericardial effusion (noninflammatory): Secondary | ICD-10-CM

## 2016-07-04 LAB — POCT INR: INR: 2.1

## 2016-07-04 NOTE — Assessment & Plan Note (Signed)
Mark Cabrera reports that he has been having dental pain for several months. No fevers or chills. Has poor dentition on exam today. Reports being told after his recent CABG he needs to be seen by a dentist.  Referral to dentistry today.

## 2016-07-04 NOTE — Assessment & Plan Note (Signed)
Complaints of gradual vision loss since heart surgery. After further questioning he reports this has actually been ongoing for several years. Reports difficulty seeing things at a distance. Blurry/fuzzy. Requesting opthalmology referral today.  Will place referral today.

## 2016-07-04 NOTE — Assessment & Plan Note (Addendum)
Mark Cabrera presents today with complaints of chronic right knee pain and requests referral back to orthopedic surgery for evaluation. He repots that he has been told in the past that he needs a knee replacement but has put it off due to other medical issues. No change in his knee pain.   Will place referral for orthopedic surgery today per patient request.

## 2016-07-05 NOTE — Progress Notes (Signed)
Medicine attending: Medical history, presenting problems, physical findings, and medications, reviewed with resident physician Dr Nathan Boswell on the day of the patient visit and I concur with his evaluation and management plan. 

## 2016-07-07 ENCOUNTER — Telehealth (HOSPITAL_COMMUNITY): Payer: Self-pay | Admitting: Internal Medicine

## 2016-07-07 ENCOUNTER — Encounter (HOSPITAL_COMMUNITY): Payer: Self-pay | Admitting: Internal Medicine

## 2016-07-07 NOTE — Progress Notes (Signed)
Mailed letter w/CRP to pt and lft msg... kJ

## 2016-07-07 NOTE — Telephone Encounter (Signed)
Lft msg and mailed out letter w/CRP to pt... KJ

## 2016-07-10 ENCOUNTER — Ambulatory Visit (INDEPENDENT_AMBULATORY_CARE_PROVIDER_SITE_OTHER): Payer: Medicare Other

## 2016-07-10 ENCOUNTER — Ambulatory Visit (INDEPENDENT_AMBULATORY_CARE_PROVIDER_SITE_OTHER): Payer: Medicare Other | Admitting: Orthopaedic Surgery

## 2016-07-10 ENCOUNTER — Encounter (INDEPENDENT_AMBULATORY_CARE_PROVIDER_SITE_OTHER): Payer: Self-pay | Admitting: Orthopaedic Surgery

## 2016-07-10 DIAGNOSIS — M1711 Unilateral primary osteoarthritis, right knee: Secondary | ICD-10-CM | POA: Diagnosis not present

## 2016-07-10 DIAGNOSIS — I2510 Atherosclerotic heart disease of native coronary artery without angina pectoris: Secondary | ICD-10-CM | POA: Diagnosis not present

## 2016-07-10 NOTE — Addendum Note (Signed)
Addended by: Precious Bard on: 07/10/2016 11:13 AM   Modules accepted: Orders

## 2016-07-10 NOTE — Progress Notes (Addendum)
Office Visit Note   Patient: Mark Cabrera           Date of Birth: [birth year]           MRN: [id number] Visit Date: 07/10/2016              Requested by: Mark Pile, MD [address] [place], [place] [address] PCP: Mark Kerbs, DO   Assessment & Plan: Visit Diagnoses:  1. Primary osteoarthritis of right knee     Plan: Patient just had open-heart surgery 3 months ago. He is supposed to see Mark Cabrera in the next week or so. From my standpoint I'm happy to do the knee replacement as soon as we have the okay from his cardiologist. We did obtain a hemoglobin A1c level today. I gave him my card. We'll see him back as needed. Total face to face encounter time was greater than 45 minutes and over half of this time was spent in counseling and/or coordination of care.    Follow-Up Instructions: Return if symptoms worsen or fail to improve.   Orders:  Orders Placed This Encounter  Procedures  . XR KNEE 3 VIEW RIGHT   No orders of the defined types were placed in this encounter.     Procedures: No procedures performed   Clinical Data: No additional findings.   Subjective: Chief Complaint  Patient presents with  . Right Knee - Pain    Patient is a 66 year old gentleman with chronic right knee pain for many years comes in for right knee pain and discussion of knee replacement. He has significant difficulty with ADLs. His knee causing him to give out. He has trouble with balance. He is retired and lives alone. He did under go CABG just 3 months ago. He is on Coumadin. He is also well controlled diabetic on oral medicines.    Review of Systems  Constitutional: Negative.   All other systems reviewed and are negative.    Objective: Vital Signs: There were no vitals taken for this visit.  Physical Exam  Constitutional: He is oriented to person, place, and time. He appears well-developed and well-nourished.  HENT:  Head: Normocephalic and atraumatic.  Eyes:  Pupils are equal, round, and reactive to light.  Neck: Neck supple.  Pulmonary/Chest: Effort normal.  Abdominal: Soft.  Musculoskeletal: Normal range of motion.  Neurological: He is alert and oriented to person, place, and time.  Skin: Skin is warm.  Psychiatric: He has a normal mood and affect. His behavior is normal. Judgment and thought content normal.  Nursing note and vitals reviewed.   Ortho Exam Right knee exam shows no joint effusion. He does have significant patellar crepitus. Collaterals and cruciates are stable. Specialty Comments:  No specialty comments available.  Imaging: Xr Knee 3 View Right  Result Date: 07/10/2016 DJD    PMFS History: Patient Active Problem List   Diagnosis Date Noted  . Chronic knee pain 07/04/2016  . Decreased visual acuity 07/04/2016  . Pain, dental 07/04/2016  . Fatigue 06/09/2016  . Penile pain 05/12/2016  . Peripheral neuropathic pain 05/12/2016  . Encounter for therapeutic drug monitoring 05/01/2016  . Pericardial effusion a. subxiphoid pericardial window on 04/21/2016 04/28/2016  . Pleural effusion on left, s/p throacentesis 04/18/16 04/28/2016  . Chest pain   . Persistent atrial fibrillation ([place]) 04/16/2016  . Anemia due to blood loss, requiring transfusion of PRBCs 04/16/2016  . 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 S/P CABG  x 2 03/30/2016  . CAD (coronary artery disease), native coronary artery   . Acute diastolic CHF (congestive heart failure) ([place])   . Sleep apnea 08/02/2009  . 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 Dyslipidemia 05/18/2006  . Hypertensive heart disease   . History of dissection of vertebral artery  ([place]) 08/02/2005   Past Medical History:  Diagnosis Date  . Acute diastolic congestive heart failure ([place])   . Aortic valve stenosis s/p AVR 2018  . Atrial fibrillation ([place]) - post-op CABG    04/2016 CHA2DS2VAS score = 5  . Atypical nevi   . Coronary  artery disease s/p 2 vessel CABG   . Depression    "years ago"  . Diabetes mellitus   . GERD (gastroesophageal reflux disease)   . History of kidney stones   . Hyperlipidemia    hx of transaminitis secondary to statin and he has decided not to use statins secondary to potential side effects.  . Hypertension   . Nephrolithiasis   . Osteoarthritis, knee   . Sleep apnea     Central apnea. Not using cpap  . Stroke [facility]) 2007  . Transaminitis     Statin-induced  . 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 2-D echo was normal no embolic source found.    Family History  Problem Relation Age of Onset  . Hypertension Mother   . Diabetes Mother   . Alcohol abuse Father     Past Surgical History:  Procedure Laterality Date  . AORTIC VALVE REPLACEMENT N/A 03/30/2016   Procedure: AORTIC VALVE REPLACEMENT (AVR);  Surgeon: Mark Nakayama, MD;  Location: [place];  Service: Open Heart Surgery;  Laterality: N/A;  . CARDIAC CATHETERIZATION N/A 03/15/2016   Procedure: Right/Left Heart Cath and Coronary Angiography;  Surgeon: Mark Blanks, MD;  Location: [place] CV LAB;  Service: Cardiovascular;  Laterality: N/A;  . CLIPPING OF ATRIAL APPENDAGE N/A 03/30/2016   Procedure: CLIPPING OF LEFT ATRIAL APPENDAGE;  Surgeon: Mark Nakayama, MD;  Location: [place];  Service: Open Heart Surgery;  Laterality: N/A;  . CORONARY ARTERY BYPASS GRAFT N/A 03/30/2016   Procedure: CORONARY ARTERY BYPASS GRAFTING (CABG) Times Two;  Surgeon: Mark Nakayama, MD;  Location: [place];  Service: Open Heart Surgery;  Laterality: N/A;  . KNEE ARTHROSCOPY W/ PARTIAL MEDIAL MENISCECTOMY  05/12/2005   right, performed by Dr. French Cabrera  for torn medial meniscus.  Marland Kitchen ROTATOR CUFF REPAIR Right 2016  . SUBXYPHOID PERICARDIAL WINDOW N/A 04/21/2016   Procedure: SUBXYPHOID PERICARDIAL WINDOW;  Surgeon: Mark Nakayama, MD;  Location: [place];  Service: Thoracic;  Laterality: N/A;  . TEE WITHOUT CARDIOVERSION N/A 03/30/2016   Procedure: TRANSESOPHAGEAL ECHOCARDIOGRAM (TEE);  Surgeon: Mark Nakayama, MD;  Location: [place];  Service: Open Heart Surgery;  Laterality: N/A;  . TEE WITHOUT CARDIOVERSION N/A 04/21/2016   Procedure: TRANSESOPHAGEAL ECHOCARDIOGRAM (TEE);  Surgeon: Mark Nakayama, MD;  Location: [place];  Service: Thoracic;  Laterality: N/A;   Social History   Occupational History  .  Medicaid /disability Unemployed    following stroke in 2007   Social History Main Topics  . Smoking status: Never Smoker  . Smokeless tobacco: Never Used  . Alcohol use  Yes     Comment: occasional  . Drug use: No  . Sexual activity: Not on file

## 2016-07-11 ENCOUNTER — Telehealth (HOSPITAL_COMMUNITY): Payer: Self-pay | Admitting: Internal Medicine

## 2016-07-11 LAB — HEMOGLOBIN A1C
Hgb A1c MFr Bld: 6.8 % — ABNORMAL HIGH (ref <5.7)
Mean Plasma Glucose: 148 mg/dL

## 2016-07-11 NOTE — Progress Notes (Signed)
A1c is within acceptable limits for TKA.  Just need cardiac clearance at this point.

## 2016-07-18 ENCOUNTER — Ambulatory Visit (INDEPENDENT_AMBULATORY_CARE_PROVIDER_SITE_OTHER): Payer: Medicare Other | Admitting: *Deleted

## 2016-07-18 ENCOUNTER — Encounter: Payer: Self-pay | Admitting: Cardiovascular Disease

## 2016-07-18 DIAGNOSIS — Z952 Presence of prosthetic heart valve: Secondary | ICD-10-CM | POA: Diagnosis not present

## 2016-07-18 DIAGNOSIS — Z5181 Encounter for therapeutic drug level monitoring: Secondary | ICD-10-CM

## 2016-07-18 DIAGNOSIS — E119 Type 2 diabetes mellitus without complications: Secondary | ICD-10-CM | POA: Diagnosis not present

## 2016-07-18 DIAGNOSIS — I481 Persistent atrial fibrillation: Secondary | ICD-10-CM | POA: Diagnosis not present

## 2016-07-18 DIAGNOSIS — I2510 Atherosclerotic heart disease of native coronary artery without angina pectoris: Secondary | ICD-10-CM

## 2016-07-18 DIAGNOSIS — I4819 Other persistent atrial fibrillation: Secondary | ICD-10-CM

## 2016-07-18 LAB — POCT INR: INR: 2.1

## 2016-07-18 LAB — HM DIABETES EYE EXAM

## 2016-07-21 ENCOUNTER — Encounter (HOSPITAL_COMMUNITY): Payer: Self-pay | Admitting: Internal Medicine

## 2016-07-21 NOTE — Progress Notes (Signed)
Mailed letter with our CRP to pt... Mark Cabrera °

## 2016-08-02 NOTE — Progress Notes (Addendum)
Chief Complaint  Patient presents with  . Shortness of Breath   History of Present Illness: 66 yo male with history of HTN, HLD, atrial fibrillation, DM, severe aortic stenosis now s/p AVR January 2018, CAD s/p 2V CABG January 2018, prior CVA here today for cardiac follow up. He was admitted to [facility] January 2018 and underwent surgical AVR for AS and 2V CABG (SVG to PDA and SVG to OM1) along with left atrial appendage clipping per Dr. Roxan Hockey. He has post-op atrial fib and volume overload. His post-operative course was also complicated by a large pericardial effusion and pleural effusion. He was readmitted and had thoracentesis and pericardial window. He has been on Lasix with some issues with volume overload post-op. Last echo February 2018 with LVEF=50-55%, moderate LVH, mild MR, AV bioprosthetic valve working well. Dilated Left atrium. Small pleural effusion.   He is here today for follow up. The patient denies any chest pain, dyspnea, palpitations, lower extremity edema, orthopnea, PND, dizziness, near syncope or syncope. He has some fatigue. He is not exercising. He has right knee pain and has seen Dr. Erlinda Hong with Orthopedic surgery. He has discussed knee replacement.   Primary Care Physician: Valinda Party, DO   Past Medical History:  Diagnosis Date  . Acute diastolic congestive heart failure ([place])   . Aortic valve stenosis s/p AVR 2018  . Atrial fibrillation ([place]) - post-op CABG    04/2016 CHA2DS2VAS score = 5  . Atypical nevi   . Coronary artery disease s/p 2 vessel CABG   . Depression    "years ago"  . Diabetes mellitus   . GERD (gastroesophageal reflux disease)   . History of kidney stones   . Hyperlipidemia    hx of transaminitis secondary to statin and he has decided not to use statins secondary to potential side effects.  . Hypertension   . Nephrolithiasis   . Osteoarthritis, knee   . Sleep apnea     Central apnea. Not using cpap  . Stroke [facility]) 2007  .  Transaminitis     Statin-induced  . 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 2-D echo was normal no embolic source found.    Past Surgical History:  Procedure Laterality Date  . AORTIC VALVE REPLACEMENT N/A 03/30/2016   Procedure: AORTIC VALVE REPLACEMENT (AVR);  Surgeon: [name], MD;  Location: [place];  Service: Open Heart Surgery;  Laterality: N/A;  . CARDIAC CATHETERIZATION N/A 03/15/2016   Procedure: Right/Left Heart Cath and Coronary Angiography;  Surgeon: Burnell Blanks, MD;  Location: [place] [facility];  Service: Cardiovascular;  Laterality: N/A;  . CLIPPING OF ATRIAL APPENDAGE N/A 03/30/2016   Procedure: CLIPPING OF LEFT ATRIAL APPENDAGE;  Surgeon: [name], MD;  Location: [place];  Service: Open Heart Surgery;  Laterality: N/A;  . CORONARY ARTERY BYPASS GRAFT N/A 03/30/2016   Procedure: CORONARY ARTERY BYPASS GRAFTING (CABG) Times Two;  Surgeon: [name], MD;  Location: [place];  Service: Open Heart Surgery;  Laterality: N/A;  . KNEE ARTHROSCOPY W/ PARTIAL MEDIAL MENISCECTOMY  05/12/2005   right, performed by Dr. French Ana for torn medial meniscus.  Marland Kitchen ROTATOR CUFF REPAIR Right 2016  . SUBXYPHOID PERICARDIAL WINDOW N/A 04/21/2016  Procedure: SUBXYPHOID PERICARDIAL WINDOW;  Surgeon: [name], MD;  Location: [place];  Service: Thoracic;  Laterality: N/A;  . TEE WITHOUT CARDIOVERSION N/A 03/30/2016   Procedure: TRANSESOPHAGEAL ECHOCARDIOGRAM (TEE);  Surgeon: [name], MD;  Location: [place];  Service: Open Heart Surgery;  Laterality: N/A;  . TEE WITHOUT CARDIOVERSION N/A 04/21/2016   Procedure: TRANSESOPHAGEAL ECHOCARDIOGRAM (TEE);  Surgeon: [name], MD;  Location: [place];  Service: Thoracic;  Laterality: N/A;    Current Outpatient Prescriptions  Medication Sig Dispense Refill  . acetaminophen (TYLENOL) 325 MG tablet Take 2 tablets (650 mg total) by mouth every 4 (four) hours as needed for headache or mild pain.    . Ascorbic Acid (VITAMIN C PO) Take 1 tablet by mouth daily.    Marland Kitchen aspirin EC 81 MG tablet Take 1 tablet (81 mg total) by mouth daily. 90 tablet 3  . carvedilol (COREG) 3.125 MG tablet Take 1 tablet (3.125 mg total) by mouth 2 (two) times daily with a meal. 60 tablet 3  . furosemide (LASIX) 40 MG tablet Take 2 tablets by mouth every morning and one tablet by mouth every afternoon 90 tablet 6  . gabapentin (NEURONTIN) 100 MG capsule Take 1 capsule (100 mg total) by mouth 2 (two) times daily. 60 capsule 2  . glipiZIDE (GLUCOTROL) 10 MG tablet Take 1 tablet (10 mg total) by mouth 2 (two) times daily before a meal. 60 tablet 6  . metFORMIN (GLUCOPHAGE-XR) 500 MG 24 hr tablet Take 1,000 mg by mouth 2 (two) times daily.    . nitroGLYCERIN (NITROSTAT) 0.4 MG SL tablet Place 1 tablet (0.4 mg total) under the tongue every 5 (five) minutes as needed for chest pain. 25 tablet 1  . potassium chloride SA (K-DUR,KLOR-CON) 20 MEQ tablet Take 1 tablet (20 mEq total) by mouth daily. 30 tablet 6  . sennosides-docusate sodium (SENOKOT-S) 8.6-50 MG tablet Take 1 tablet by mouth as needed for constipation.    . tamsulosin (FLOMAX) 0.4 MG CAPS capsule Take 1 capsule (0.4 mg total) by mouth daily after supper. 30 capsule 3  . traMADol (ULTRAM) 50 MG tablet Take 1-2 tablets (50-100 mg total) by mouth every 6 (six) hours as needed (mild pain). 30 tablet 0   No current facility-administered medications for this visit.     Allergies  Allergen Reactions  . Crab (Diagnostic) Anaphylaxis  . Penicillins Other (See Comments)    UNSPECIFIED REACTIONS  Has patient had a PCN reaction causing immediate rash, facial/tongue/throat swelling, SOB or  lightheadedness with hypotension: Unk Has patient had a PCN reaction causing severe rash involving mucus membranes or skin necrosis: Unk Has patient had a PCN reaction that required hospitalization: Unk Has patient had a PCN reaction occurring within the last 10 years: No If all of the above answers are "NO", then may proceed with Cephalosporin use.   . Statins     UNSPECIFIED REACTION   . Morphine And Related Other (See Comments)    hallucinations    Social History   Social History  . Marital status: Divorced    Spouse name: N/A  . Number of children: 1  . Years of education: 2y college   Occupational History  .  Medicaid /disability Unemployed    following stroke in 2007   Social History Main Topics  . Smoking status: Never Smoker  . Smokeless tobacco: Never Used  . Alcohol use Yes     Comment: occasional  . Drug use: No  .  Sexual activity: Not on file   Other Topics Concern  . Not on file   Social History Narrative   Single but has a girlfriend.    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       Patient has medications disability post stroke.             Family History  Problem Relation Age of Onset  . Hypertension Mother   . Diabetes Mother   . Alcohol abuse Father     Review of Systems:  As stated in the HPI and otherwise negative.   BP 104/70   Pulse 67   Ht 5\' 8"  (1.727 m)   Wt 223 lb 12.8 oz (101.5 kg)   SpO2 99%   BMI 34.03 kg/m   Physical Examination:  General: Well developed, well nourished, NAD  HEENT: OP clear, mucus membranes moist  SKIN: warm, dry. No rashes. Neuro: No focal deficits  Musculoskeletal: Muscle strength 5/5 all ext  Psychiatric: Mood and affect normal  Neck: No JVD, no carotid bruits, no thyromegaly, no lymphadenopathy.  Lungs:Clear bilaterally, no wheezes, rhonci, crackles Cardiovascular: Regular rate and rhythm. No murmurs, gallops or rubs. Abdomen:Soft. Bowel sounds present.  Non-tender.  Extremities: No lower extremity edema. Pulses are 2 + in the bilateral DP/PT.  Echo February 2018: Left ventricle: The cavity size was moderately dilated. There was   moderate concentric hypertrophy. Systolic function was normal.   The estimated ejection fraction was in the range of 50% to 55%.   Wall motion was normal; there were no regional wall motion   abnormalities. - Aortic valve: A bovine bioprosthesis was present. Mean gradient   (S): 9 mm Hg. - Mitral valve: There was mild regurgitation. - Left atrium: The atrium was severely dilated. Anterior-posterior   dimension: 56 mm. - Tricuspid valve: There was trivial regurgitation. - Pericardium, extracardiac: A small, free-flowing pericardial   effusion was identified circumferential to the heart. The fluid   had no internal echoes. There was a right pleural effusion. There   was a large left pleural effusion.  EKG:  EKG is not ordered today. The ekg ordered today demonstrates   Recent Labs: 04/16/2016: TSH 2.030 04/21/2016: Magnesium 1.8 04/23/2016: ALT 21 05/03/2016: B Natriuretic Peptide 128.1; Hemoglobin 11.5; Platelets 443 06/28/2016: BUN 21; Creatinine, Ser 1.13; Potassium 3.5; Sodium 137   Lipid Panel    Component Value Date/Time   CHOL 206 (H) 04/25/2013 1535   TRIG 241 (H) 04/25/2013 1535   HDL 47 04/25/2013 1535   CHOLHDL 4.4 04/25/2013 1535   VLDL 48 (H) 04/25/2013 1535   LDLCALC 111 (H) 04/25/2013 1535     Wt Readings from Last 3 Encounters:  08/03/16 223 lb 12.8 oz (101.5 kg)  06/30/16 223 lb 11.2 oz (101.5 kg)  06/21/16 223 lb 12.8 oz (101.5 kg)     Other studies Reviewed: Additional studies/ records that were reviewed today include: . Review of the above records demonstrates:   Assessment and Plan:   1. CAD without angina: He is having no chest pain suggestive of angina. He is s/p 2 vessel CABG in January 2018. He is doing well. Will continue ASA and beta blocker. He refuses statins.    2.  Pericardial effusion: Resolved s/p pericardial window. Will stop colchicine.    3. Aortic stenosis: s/p surgical AVR with bioprosthetic AVR.   4. Atrial fib, paroxysmal: He is in sinus today. He is on coumadin and amiodarone. Will  stop coumadin and amiodarone since his atrial fib was post-op with no recurrence.   5. HTN: BP controlled. NO changes.   6. Hyperlipidemia: He is intolerant to statins. ? Referral to lipid clinic.    7. Chronic diastolic CHF: Volume status ok today on Lasix 40 mg in the am and 80 mg at night.   8. Pre-operative cardiovascular risk assessment: He can proceed with planned knee replacement. I am stopping his coumadin today.   Current medicines are reviewed at length with the patient today.  The patient does not have concerns regarding medicines.  The following changes have been made:  no change  Labs/ tests ordered today include:   No orders of the defined types were placed in this encounter.  Disposition:   FU with me in 2 months   Signed, Lauree Chandler, MD 08/03/2016 9:00 AM    [address], [place], [place]  [address] Phone: [phone]; Fax: [phone]

## 2016-08-03 ENCOUNTER — Encounter: Payer: Self-pay | Admitting: Cardiovascular Disease

## 2016-08-03 ENCOUNTER — Ambulatory Visit (INDEPENDENT_AMBULATORY_CARE_PROVIDER_SITE_OTHER): Payer: Medicare Other | Admitting: Cardiovascular Disease

## 2016-08-03 ENCOUNTER — Encounter (INDEPENDENT_AMBULATORY_CARE_PROVIDER_SITE_OTHER): Payer: Self-pay

## 2016-08-03 ENCOUNTER — Ambulatory Visit (INDEPENDENT_AMBULATORY_CARE_PROVIDER_SITE_OTHER): Payer: Medicare Other | Admitting: *Deleted

## 2016-08-03 VITALS — BP 104/70 | HR 67 | Ht 68.0 in | Wt 223.8 lb

## 2016-08-03 DIAGNOSIS — Z952 Presence of prosthetic heart valve: Secondary | ICD-10-CM

## 2016-08-03 DIAGNOSIS — I350 Nonrheumatic aortic (valve) stenosis: Secondary | ICD-10-CM | POA: Diagnosis not present

## 2016-08-03 DIAGNOSIS — I313 Pericardial effusion (noninflammatory): Secondary | ICD-10-CM

## 2016-08-03 DIAGNOSIS — I4819 Other persistent atrial fibrillation: Secondary | ICD-10-CM

## 2016-08-03 DIAGNOSIS — Z5181 Encounter for therapeutic drug level monitoring: Secondary | ICD-10-CM

## 2016-08-03 DIAGNOSIS — I10 Essential (primary) hypertension: Secondary | ICD-10-CM

## 2016-08-03 DIAGNOSIS — I3139 Other pericardial effusion (noninflammatory): Secondary | ICD-10-CM

## 2016-08-03 DIAGNOSIS — I5032 Chronic diastolic (congestive) heart failure: Secondary | ICD-10-CM

## 2016-08-03 DIAGNOSIS — I2510 Atherosclerotic heart disease of native coronary artery without angina pectoris: Secondary | ICD-10-CM | POA: Diagnosis not present

## 2016-08-03 DIAGNOSIS — I481 Persistent atrial fibrillation: Secondary | ICD-10-CM

## 2016-08-03 NOTE — Patient Instructions (Signed)
Medication Instructions:  Your physician has recommended you make the following change in your medication:  Stop colchicine. Stop amiodarone. Stop coumadin    Labwork: none  Testing/Procedures: none  Follow-Up: Your physician recommends that you schedule a follow-up appointment in: 6 months. Please call our office in about 3 months to schedule this appointment    Any Other Special Instructions Will Be Listed Below (If Applicable).     If you need a refill on your cardiac medications before your next appointment, please call your pharmacy.

## 2016-08-14 NOTE — Addendum Note (Signed)
Addendum  created 08/14/16 1058 by Myrtie Soman, MD   Sign clinical note

## 2016-08-16 ENCOUNTER — Telehealth (INDEPENDENT_AMBULATORY_CARE_PROVIDER_SITE_OTHER): Payer: Self-pay

## 2016-08-16 ENCOUNTER — Other Ambulatory Visit: Payer: Self-pay | Admitting: Physician Assistant

## 2016-08-16 NOTE — Telephone Encounter (Signed)
We can try it.  I don't think it'll give him much relief but if he wants to try it, we can submit for monovisc

## 2016-08-16 NOTE — Telephone Encounter (Signed)
See message below °

## 2016-08-16 NOTE — Telephone Encounter (Signed)
I called patient about scheduling TKA.  He states he would like to try knee gel injections 1st, if you think he is a candidate.  Please call [phone].

## 2016-08-17 NOTE — Telephone Encounter (Signed)
Called to advise and no answer LMOM with details below. He will hopefully call me back and let me know what he would like to do

## 2016-08-18 ENCOUNTER — Telehealth: Payer: Self-pay | Admitting: *Deleted

## 2016-08-22 ENCOUNTER — Other Ambulatory Visit: Payer: Self-pay | Admitting: Physician Assistant

## 2016-08-25 ENCOUNTER — Telehealth: Payer: Self-pay

## 2016-08-25 MED ORDER — CARVEDILOL 3.125 MG PO TABS: 3.1250 mg | ORAL_TABLET | Freq: Two times a day (BID) | ORAL | 11 refills | Status: DC

## 2016-08-27 NOTE — Telephone Encounter (Signed)
I am ok to refill this for him. Thanks, chris

## 2016-08-28 MED ORDER — TAMSULOSIN HCL 0.4 MG PO CAPS: 0.4000 mg | ORAL_CAPSULE | Freq: Every day | ORAL | 11 refills | Status: DC

## 2016-08-28 NOTE — Telephone Encounter (Signed)
Refill sent to [facility] on [address]. I placed call to pt and left message to call back

## 2016-08-29 NOTE — Telephone Encounter (Signed)
I spoke with pt and let him know prescription has been sent to pharmacy

## 2016-08-30 ENCOUNTER — Other Ambulatory Visit: Payer: Self-pay | Admitting: *Deleted

## 2016-08-30 NOTE — Telephone Encounter (Signed)
New message     *STAT* If patient is at the pharmacy, call can be transferred to refill team.   1. Which medications need to be refilled? (please list name of each medication and dose if known) carvedilol 3.125 mg, metformin 500 mg  2. Which pharmacy/location (including street and city if local pharmacy) is medication to be sent to? [facility] on [address].  3. Do they need a 30 day or 90 day supply? 30 day

## 2016-08-31 ENCOUNTER — Telehealth: Payer: Self-pay | Admitting: Cardiovascular Disease

## 2016-08-31 ENCOUNTER — Other Ambulatory Visit: Payer: Self-pay | Admitting: Internal Medicine

## 2016-08-31 MED ORDER — CARVEDILOL 3.125 MG PO TABS: 3.1250 mg | ORAL_TABLET | Freq: Two times a day (BID) | ORAL | 11 refills | Status: DC

## 2016-08-31 MED ORDER — METFORMIN HCL ER 500 MG PO TB24: 1000.0000 mg | ORAL_TABLET | Freq: Two times a day (BID) | ORAL | 0 refills | Status: DC

## 2016-08-31 NOTE — Telephone Encounter (Signed)
New message    Pt is calling asking for a call back about his medication. He has some questions about what medications have been discontinued and what he should take.

## 2016-08-31 NOTE — Telephone Encounter (Signed)
Refill Request      carvedilol (COREG) 3.125 MG tablet glipiZIDE (GLUCOTROL) 10 MG tablet Take 1 tablet (10 mg total) by mouth 2 (two) times daily before a meal., Starting Fri 04/28/2016, Normal, Last Dose: Not Recorded metformin (FORTAMET) 1000 MG (OSM) 24 hr tablet

## 2016-08-31 NOTE — Telephone Encounter (Signed)
I went over medications that were stopped at last office visit with him.  He has contacted primary care and they are refilling his diabetes medications.

## 2016-08-31 NOTE — Telephone Encounter (Signed)
I spoke with pt. Will send refills of Coreg to [facility] on [address].

## 2016-09-07 ENCOUNTER — Encounter: Payer: Self-pay | Admitting: Internal Medicine

## 2016-09-07 ENCOUNTER — Ambulatory Visit (INDEPENDENT_AMBULATORY_CARE_PROVIDER_SITE_OTHER): Payer: Medicare Other | Admitting: Internal Medicine

## 2016-09-07 VITALS — BP 134/80 | HR 92 | Temp 97.7°F | Wt 230.0 lb

## 2016-09-07 DIAGNOSIS — E785 Hyperlipidemia, unspecified: Secondary | ICD-10-CM | POA: Diagnosis not present

## 2016-09-07 DIAGNOSIS — E1142 Type 2 diabetes mellitus with diabetic polyneuropathy: Secondary | ICD-10-CM

## 2016-09-07 DIAGNOSIS — Z79899 Other long term (current) drug therapy: Secondary | ICD-10-CM | POA: Diagnosis not present

## 2016-09-07 DIAGNOSIS — Z7984 Long term (current) use of oral hypoglycemic drugs: Secondary | ICD-10-CM | POA: Diagnosis not present

## 2016-09-07 DIAGNOSIS — E118 Type 2 diabetes mellitus with unspecified complications: Secondary | ICD-10-CM | POA: Diagnosis not present

## 2016-09-07 LAB — POCT GLYCOSYLATED HEMOGLOBIN (HGB A1C): Hemoglobin A1C: 6.8

## 2016-09-07 LAB — GLUCOSE, CAPILLARY: GLUCOSE-CAPILLARY: 184 mg/dL — AB (ref 65–99)

## 2016-09-07 MED ORDER — GLIPIZIDE 10 MG PO TABS: 10.0000 mg | ORAL_TABLET | Freq: Two times a day (BID) | ORAL | 11 refills | Status: DC

## 2016-09-07 MED ORDER — METFORMIN HCL ER 500 MG PO TB24: 1000.0000 mg | ORAL_TABLET | Freq: Two times a day (BID) | ORAL | 11 refills | Status: DC

## 2016-09-07 NOTE — Patient Instructions (Signed)
Mr. Oats,  It was a pleasure seeing you today. Refills have been sent to your pharmacy for your diabetes medication. Please set up an appointment to follow-up on having an ultrasound for your arm.

## 2016-09-08 LAB — LIPID PANEL
Chol/HDL Ratio: 7 ratio — ABNORMAL HIGH (ref 0.0–5.0)
Cholesterol, Total: 286 mg/dL — ABNORMAL HIGH (ref 100–199)
HDL: 41 mg/dL (ref >39)
TRIGLYCERIDES: 513 mg/dL — AB (ref 0–149)

## 2016-09-12 ENCOUNTER — Ambulatory Visit (INDEPENDENT_AMBULATORY_CARE_PROVIDER_SITE_OTHER): Payer: Medicare Other | Admitting: Internal Medicine

## 2016-09-12 VITALS — BP 123/67 | HR 78 | Temp 98.1°F | Wt 230.1 lb

## 2016-09-12 DIAGNOSIS — G8929 Other chronic pain: Secondary | ICD-10-CM

## 2016-09-12 DIAGNOSIS — D1722 Benign lipomatous neoplasm of skin and subcutaneous tissue of left arm: Secondary | ICD-10-CM

## 2016-09-12 DIAGNOSIS — M25512 Pain in left shoulder: Secondary | ICD-10-CM | POA: Diagnosis not present

## 2016-09-12 DIAGNOSIS — Z7982 Long term (current) use of aspirin: Secondary | ICD-10-CM

## 2016-09-12 MED ORDER — GABAPENTIN 100 MG PO CAPS: 100.0000 mg | ORAL_CAPSULE | Freq: Two times a day (BID) | ORAL | 1 refills | Status: DC

## 2016-09-12 NOTE — Progress Notes (Signed)
   CC: left arm lump  HPI:  [name] is a 66 y.o. male with history noted below the presents to the acute care clinic for chronic lump on his left arm that has been present for the past 3 years. In the past year patient has noted growth to the area with mild tenderness to deep palpation. Patient denies trauma to the area. Patient denies any radiating pain.    Past Medical History:  Diagnosis Date  . Acute diastolic congestive heart failure ([place])   . Aortic valve stenosis s/p AVR 2018  . Atrial fibrillation ([place]) - post-op CABG    04/2016 CHA2DS2VAS score = 5  . Atypical nevi   . Coronary artery disease s/p 2 vessel CABG   . Depression    "years ago"  . Diabetes mellitus   . GERD (gastroesophageal reflux disease)   . History of kidney stones   . Hyperlipidemia    hx of transaminitis secondary to statin and he has decided not to use statins secondary to potential side effects.  . Hypertension   . Nephrolithiasis   . Osteoarthritis, knee   . Sleep apnea     Central apnea. Not using cpap  . Stroke [facility]) 2007  . Transaminitis     Statin-induced  . 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 2-D echo was normal no embolic source found.    Review of Systems:  Review of Systems  Constitutional: Negative for chills, fever, malaise/fatigue and weight loss.  Musculoskeletal: Negative for myalgias.  Neurological: Negative for tingling, focal weakness and weakness.     Physical Exam:  Vitals:   09/12/16 0837  BP: 123/67  Pulse: 78  Temp: 98.1 F (36.7 C)  TempSrc: Oral  SpO2: 99%  Weight: 230 lb 1.6 oz (104.4 kg)   Physical Exam  Constitutional: He is well-developed, well-nourished, and in no  distress.  Pulmonary/Chest: Effort normal.  Musculoskeletal: He exhibits no edema.  2cm x 4cm soft mobile mass in left tricep  Neurological:  5/5 strength in upper and lower extremities bilaterally   Skin: Skin is warm and dry.    Assessment & Plan:   See encounters tab for problem based medical decision making.    Patient seen with Dr. Evette Doffing

## 2016-09-12 NOTE — Progress Notes (Signed)
Internal Medicine Clinic Attending  Case discussed with Dr. Hoffman at the time of the visit.  We reviewed the resident's history and exam and pertinent patient test results.  I agree with the assessment, diagnosis, and plan of care documented in the resident's note.  

## 2016-09-12 NOTE — Patient Instructions (Addendum)
Mark Cabrera,  It was a pleasure seeing you today. Please follow up in 3 months. In the meantime please says that the acute care clinic for any acute medical needs.

## 2016-09-12 NOTE — Progress Notes (Signed)
   CC:  Follow up on type II diabetes   HPI:  [name] is a 66 y.o. male with history noted below that presents to the internal medicine clinic for follow-up on type 2 diabetes. He reports compliance with his metformin 500mg  twice daily and glipizide 10mg  twice daily.  He denies any hypoglycemic episodes. He states that despite having a glucometer he does not use it to check his blood sugars.    Past Medical History:  Diagnosis Date  . Acute diastolic congestive heart failure ([place])   . Aortic valve stenosis s/p AVR 2018  . Atrial fibrillation ([place]) - post-op CABG    04/2016 CHA2DS2VAS score = 5  . Atypical nevi   . Coronary artery disease s/p 2 vessel CABG   . Depression    "years ago"  . Diabetes mellitus   . GERD (gastroesophageal reflux disease)   . History of kidney stones   . Hyperlipidemia    hx of transaminitis secondary to statin and he has decided not to use statins secondary to potential side effects.  . Hypertension   . Nephrolithiasis   . Osteoarthritis, knee   . Sleep apnea     Central apnea. Not using cpap  . Stroke [facility]) 2007  . Transaminitis     Statin-induced  . 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 2-D echo was normal no embolic source found.    Review of Systems:  Review of Systems  Eyes: Negative for blurred vision.  Respiratory: Negative for shortness of breath.   Cardiovascular: Negative for chest pain.  Gastrointestinal: Negative for abdominal pain.  Genitourinary: Negative for frequency and urgency.     Physical Exam:  Vitals:   09/07/16 1323  BP: 134/80  Pulse: 92  Temp: 97.7 F (36.5 C)  TempSrc: Oral  SpO2: 100%  Weight: 230 lb (104.3 kg)    Physical Exam  Constitutional: He is well-developed, well-nourished, and in no distress.  Cardiovascular: Normal rate, regular rhythm and normal heart sounds.  Exam reveals no gallop and no friction rub.   No murmur heard. Pulmonary/Chest: Effort normal and breath sounds normal. No respiratory distress. He has no wheezes. He has no rales.  Abdominal: Soft. He exhibits no distension. There is no tenderness.  Musculoskeletal: He exhibits no edema.    Assessment & Plan:   See encounters tab for problem based medical decision making.   Patient seen with Dr. Dareen Piano

## 2016-09-12 NOTE — Assessment & Plan Note (Signed)
Assessment: Type 2 diabetes mellitus Patient takes metformin and glipizide. His hemoglobin A1c today was 6.8.  Plan -refill glipizide and metformin

## 2016-09-12 NOTE — Assessment & Plan Note (Addendum)
Assessment: dyslipidemia Based on an office lipid panel patient's total cholesterol is 286 and LDL  cannot be calculated due to triglycerides being elevated. HDL is 41. ASCVD 10 year risk of heart disease or stroke is 42%. Recommendation is a high intensity statin.  Plan -discuss with patient starting a high intensity statin at next visit on 09/12/16

## 2016-09-12 NOTE — Assessment & Plan Note (Signed)
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  There was a small mass measuring at 2 cm x 4 cm that was superficial, soft, mobile and painless most consistent with lipoma.  The lipoma does not currently cause restriction to arm movement.  Will continue to monitor.  Told patient to return if area continued to grow or cause pain.  Plan -In office ultrasound -Will continue to monitor area on future visits

## 2016-09-14 NOTE — Progress Notes (Signed)
Internal Medicine Clinic Attending  I saw and evaluated the patient.  I personally confirmed the key portions of the history and exam documented by Dr. Heber Audubon and I reviewed pertinent patient test results.  The assessment, diagnosis, and plan were formulated together and I agree with the documentation in the resident's note.  Left arm 2x4cm soft tissue mass on the lateral arm between the bicep and tricep. It feels like a lipoma on exam. On POCUS, there is a soft tissue prominence, non-encapsulated, same echogenicity of surrounding structures, no posterior enhancement, also consistent with lipoma. Also addressed today was chronic left shoulder pain, story consistent with intermittent impingement syndrome. On POCUS he has a moderately filled subacromial bursa consistent with rotator cuff disease and impingement. We encouraged intermittent NSAIDs for flares of pain, and care with over head activity. If pain worsens in the future, he would probably respond well to subacromial steroid injection.

## 2016-10-03 ENCOUNTER — Other Ambulatory Visit: Payer: Self-pay | Admitting: Cardiovascular Disease

## 2016-10-03 ENCOUNTER — Other Ambulatory Visit: Payer: Self-pay | Admitting: Cardiology

## 2016-10-03 MED ORDER — FUROSEMIDE 40 MG PO TABS: ORAL_TABLET | ORAL | 9 refills | Status: DC

## 2016-10-03 NOTE — Telephone Encounter (Signed)
Per Dr. [name] LOV on 08/03/16,  Chronic diastolic CHF: Volume status ok today on Lasix 40 mg in the am and 80 mg at night. I resent pt's medication, with the correct directions,  to pt's pharmacy as requested. Confirmation received.

## 2016-10-10 ENCOUNTER — Encounter: Payer: Self-pay | Admitting: *Deleted

## 2016-12-04 ENCOUNTER — Other Ambulatory Visit: Payer: Self-pay

## 2016-12-04 ENCOUNTER — Other Ambulatory Visit: Payer: Self-pay | Admitting: Cardiology

## 2016-12-05 ENCOUNTER — Encounter: Payer: Self-pay | Admitting: Physician Assistant

## 2016-12-05 ENCOUNTER — Ambulatory Visit (INDEPENDENT_AMBULATORY_CARE_PROVIDER_SITE_OTHER): Payer: Medicare Other | Admitting: Physician Assistant

## 2016-12-05 ENCOUNTER — Telehealth: Payer: Self-pay | Admitting: Physician Assistant

## 2016-12-05 VITALS — BP 112/85 | HR 85 | Temp 98.1°F | Resp 16 | Ht 68.0 in | Wt 224.4 lb

## 2016-12-05 DIAGNOSIS — I2510 Atherosclerotic heart disease of native coronary artery without angina pectoris: Secondary | ICD-10-CM

## 2016-12-05 DIAGNOSIS — Z951 Presence of aortocoronary bypass graft: Secondary | ICD-10-CM

## 2016-12-05 DIAGNOSIS — R0789 Other chest pain: Secondary | ICD-10-CM | POA: Diagnosis not present

## 2016-12-05 DIAGNOSIS — Z952 Presence of prosthetic heart valve: Secondary | ICD-10-CM

## 2016-12-05 DIAGNOSIS — I3139 Other pericardial effusion (noninflammatory): Secondary | ICD-10-CM

## 2016-12-05 DIAGNOSIS — I313 Pericardial effusion (noninflammatory): Secondary | ICD-10-CM | POA: Diagnosis not present

## 2016-12-05 DIAGNOSIS — N4889 Other specified disorders of penis: Secondary | ICD-10-CM

## 2016-12-05 NOTE — Addendum Note (Signed)
Addended by: Imogene Burn on: 12/05/2016 03:23 PM   Modules accepted: Orders

## 2016-12-05 NOTE — Progress Notes (Addendum)
Cardiology Office Note    Date:  12/06/2016   ID:  Mark Cabrera, Mark Cabrera 02/28/1951, MRN [id number]  PCP:  Valinda Party, DO  Cardiologist: Dr. [name]  Chief Complaint  Patient presents with  . Chest Pain    History of Present Illness:  Mark Cabrera is a 66 y.o. male with history of CABG 2 and aVR 03/2016 along with left atrial appendage clipping. He had postop atrial fibrillation volume overload and large pericardial effusion status post pericardial window and pleural effusion. Last echo 04/2016 LVEF 50-55% with moderate LVH, mild MR, aortic valve bioprosthetic valve working well, dilated left atrium and small pleural effusion. History of prior CVA, atrial fibrillation, HTN, HLD, DM.  Patient last saw Dr. [name] 07/2016 and was doing well. He refuses to take a statin. Amiodarone and Coumadin were stopped because he had no further atrial fibrillation.  2 weeks ago was pulling weeds in his garden and had chest soreness. Then a red  elevated bump came up on his sternotomy scar that became a blister. He sterilized a needle and burst it and it was clear liquid. He put on triple antibiotic and it initially cleared. 2 days later he it became raised and he expressed pus. He used peroxide and triple antibiotic cream and it cleared. But now having a lot of soreness in his chest and worried he could have an infection. Has been sore since his surgery but now worse. Also rash and peeling of his penis that happens once a year and isn't clearing. He's been tested for STD's in the past and was negative but he's uncircumcised and hasn't seen urologist.  Past Medical History:  Diagnosis Date  . Acute diastolic congestive heart failure ([place])   . Aortic valve stenosis s/p AVR 2018  . Atrial fibrillation ([employer]) - post-op CABG    04/2016 CHA2DS2VAS score = 5  . Atypical nevi   . Coronary artery disease s/p 2 vessel CABG   . Depression    "years ago"  . Diabetes mellitus   . GERD  (gastroesophageal reflux disease)   . History of kidney stones   . Hyperlipidemia    hx of transaminitis secondary to statin and he has decided not to use statins secondary to potential side effects.  . Hypertension   . Nephrolithiasis   . Osteoarthritis, knee   . Sleep apnea     Central apnea. Not using cpap  . Stroke [facility]) 2007  . Transaminitis     Statin-induced  . 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 2-D echo was normal no embolic source found.    Past Surgical History:  Procedure Laterality Date  . AORTIC VALVE REPLACEMENT N/A 03/30/2016   Procedure: AORTIC VALVE REPLACEMENT (AVR);  Surgeon: [name], MD;  Location: [place];  Service: Open Heart Surgery;  Laterality: N/A;  . CARDIAC CATHETERIZATION N/A 03/15/2016   Procedure: Right/Left Heart Cath and Coronary Angiography;  Surgeon: Burnell Blanks, MD;  Location: [place] [facility];  Service: Cardiovascular;  Laterality: N/A;  . CLIPPING OF ATRIAL APPENDAGE N/A 03/30/2016   Procedure: CLIPPING OF LEFT ATRIAL APPENDAGE;  Surgeon: [name], MD;  Location: [place];  Service: Open Heart Surgery;  Laterality: N/A;  .  CORONARY ARTERY BYPASS GRAFT N/A 03/30/2016   Procedure: CORONARY ARTERY BYPASS GRAFTING (CABG) Times Two;  Surgeon: [name], MD;  Location: [place];  Service: Open Heart Surgery;  Laterality: N/A;  . KNEE ARTHROSCOPY W/ PARTIAL MEDIAL MENISCECTOMY  05/12/2005   right, performed by Dr. French Ana for torn medial meniscus.  Marland Kitchen ROTATOR CUFF REPAIR Right 2016  . SUBXYPHOID PERICARDIAL WINDOW N/A 04/21/2016   Procedure: SUBXYPHOID PERICARDIAL WINDOW;  Surgeon: [name], MD;  Location: [place];  Service:  Thoracic;  Laterality: N/A;  . TEE WITHOUT CARDIOVERSION N/A 03/30/2016   Procedure: TRANSESOPHAGEAL ECHOCARDIOGRAM (TEE);  Surgeon: [name], MD;  Location: [place];  Service: Open Heart Surgery;  Laterality: N/A;  . TEE WITHOUT CARDIOVERSION N/A 04/21/2016   Procedure: TRANSESOPHAGEAL ECHOCARDIOGRAM (TEE);  Surgeon: [name], MD;  Location: [facility] OR;  Service: Thoracic;  Laterality: N/A;    Current Medications: Current Meds  Medication Sig  . acetaminophen (TYLENOL) 325 MG tablet Take 2 tablets (650 mg total) by mouth every 4 (four) hours as needed for headache or mild pain.  . Ascorbic Acid (VITAMIN C PO) Take 1 tablet by mouth daily.  Marland Kitchen aspirin EC 81 MG tablet Take 1 tablet (81 mg total) by mouth daily.  . carvedilol (COREG) 3.125 MG tablet Take 1 tablet (3.125 mg total) by mouth 2 (two) times daily with a meal.  . furosemide (LASIX) 40 MG tablet Take 1 tablet by mouth every morning and 2 tablet by mouth every night.  . gabapentin (NEURONTIN) 100 MG capsule Take 1 capsule (100 mg total) by mouth 2 (two) times daily.  Marland Kitchen glipiZIDE (GLUCOTROL) 10 MG tablet Take 1 tablet (10 mg total) by mouth 2 (two) times daily before a meal.  . KLOR-CON M20 20 MEQ tablet TAKE 1 TABLET BY MOUTH ONCE DAILY  . metFORMIN (GLUCOPHAGE-XR) 500 MG 24 hr tablet Take 2 tablets (1,000 mg total) by mouth 2 (two) times daily.  . nitroGLYCERIN (NITROSTAT) 0.4 MG SL tablet Place 1 tablet (0.4 mg total) under the tongue every 5 (five) minutes as needed for chest pain.  . tamsulosin (FLOMAX) 0.4 MG CAPS capsule Take 1 capsule (0.4 mg total) by mouth daily after supper.  . [DISCONTINUED] traMADol (ULTRAM) 50 MG tablet Take 1-2 tablets (50-100 mg total) by mouth every 6 (six) hours as needed (mild pain).     Allergies:   Penicillins; Statins; and Morphine and related   Social History   Social History  . Marital status: Divorced    Spouse name: N/A  . Number of children: 1  . Years of education: 2y  college   Occupational History  .  Medicaid /disability Unemployed    following stroke in 2007   Social History Main Topics  . Smoking status: Never Smoker  . Smokeless tobacco: Never Used  . Alcohol use Yes     Comment: occasional  . Drug use: No  . Sexual activity: Not Asked   Other Topics Concern  . None   Social History Narrative   Single but has a girlfriend.    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       Patient has medications disability post stroke.              Family History:  The patient's family history includes Alcohol abuse in his father; Diabetes in his mother; Hypertension in his mother.   ROS:   Please see the history of  present illness.    Review of Systems  Constitution: Negative.  HENT: Negative.   Cardiovascular: Positive for chest pain.  Respiratory: Negative.   Endocrine: Negative.   Hematologic/Lymphatic: Negative.   Musculoskeletal: Negative.   Gastrointestinal: Negative.   Genitourinary: Negative.   Neurological: Negative.    All other systems reviewed and are negative.   PHYSICAL EXAM:   VS:  BP 112/85   Pulse 85   Temp 98.1 F (36.7 C) (Oral)   Resp 16   Ht 5\' 8"  (1.727 m)   Wt 224 lb 6.4 oz (101.8 kg)   SpO2 97%   BMI 34.12 kg/m   Physical Exam  GEN: Well nourished, well developed, in no acute distress  Neck: no JVD, carotid bruits, or masses Cardiac:Turn all scar looks completely normal without infection,sore to WUJWJ,XBJ;4/7 systolic murmur LSB  Respiratory:  clear to auscultation bilaterally, normal work of breathing GI: soft, nontender, nondistended, + BS Ext: without cyanosis, clubbing, or edema, Good distal pulses bilaterally Neuro:  Alert and Oriented x 3 Psych: euthymic mood, full affect  Wt Readings from Last 3 Encounters:  12/05/16 224 lb 6.4 oz (101.8 kg)  09/12/16 230 lb 1.6 oz (104.4 kg)  09/07/16 230 lb (104.3 kg)      Studies/Labs Reviewed:   EKG:  EKG is not  ordered today.   Recent Labs: 04/16/2016: TSH 2.030 04/21/2016: Magnesium 1.8 04/23/2016: ALT 21 05/03/2016: B Natriuretic Peptide 128.1 12/05/2016: BUN 23; Creatinine, Ser 1.21; Hemoglobin 12.2; Platelets 200; Potassium 4.2; Sodium 133   Lipid Panel    Component Value Date/Time   CHOL 286 (H) 09/07/2016 1430   TRIG 513 (H) 09/07/2016 1430   HDL 41 09/07/2016 1430   CHOLHDL 7.0 (H) 09/07/2016 1430   CHOLHDL 4.4 04/25/2013 1535   VLDL 48 (H) 04/25/2013 1535   LDLCALC Comment 09/07/2016 1430    Additional studies/ records that were reviewed today include:  2DEcho February 2018: Left ventricle: The cavity size was moderately dilated. There was   moderate concentric hypertrophy. Systolic function was normal.   The estimated ejection fraction was in the range of 50% to 55%.   Wall motion was normal; there were no regional wall motion   abnormalities. - Aortic valve: A bovine bioprosthesis was present. Mean gradient   (S): 9 mm Hg. - Mitral valve: There was mild regurgitation. - Left atrium: The atrium was severely dilated. Anterior-posterior   dimension: 56 mm. - Tricuspid valve: There was trivial regurgitation. - Pericardium, extracardiac: A small, free-flowing pericardial   effusion was identified circumferential to the heart. The fluid   had no internal echoes. There was a right pleural effusion. There   was a large left pleural effusion.       ASSESSMENT:    1. Other chest pain   2. S/P CABG x 2   3. 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)   4. Pericardial effusion a. subxiphoid pericardial window on 04/21/2016   5. Penile pain      PLAN:  In order of problems listed above:  Chest soreness with some pus coming out of his sternum 2 weeks ago after pulling weeds. See details above. Chest incision looks good today without evidence of infection.No fever. Suspect it could've been a retained stitch. Patient is still quite sore in his chest and has an aortic valve  replacement. Discussed with Dr. Radford Pax who recommends CT scan and referral back to cardiovascular surgeon. We'll also do a CBC.  Status post CABG 2  in tissue aVR 03/2016 without angina  Pericardial effusion status post pericardial window 04/21/2016  Penile pain and rash recommend he see his primary care and/or a urologist.      Medication Adjustments/Labs and Tests Ordered: Current medicines are reviewed at length with the patient today.  Concerns regarding medicines are outlined above.  Medication changes, Labs and Tests ordered today are listed in the Patient Instructions below. Patient Instructions  Medication Instructions:  Your physician recommends that you continue on your current medications as directed. Please refer to the Current Medication list given to you today.   Labwork: TODAY:  CBC  Testing/Procedures: Non-Cardiac CT scanning, (CAT scanning), is a noninvasive, special x-ray that produces cross-sectional images of the body using x-rays and a computer. CT scans help physicians diagnose and treat medical conditions. For some CT exams, a contrast material is used to enhance visibility in the area of the body being studied. CT scans provide greater clarity and reveal more details than regular x-ray exams.   Follow-Up: Your physician recommends that you schedule a follow-up appointment in: DR. [place] AN APPOINTMENT  Your physician recommends that you schedule a follow-up appointment in: NEXT AVAILABLE WITH DR. [name]    Any Other Special Instructions Will Be Listed Below (If Applicable).    CT Scan A CT scan is a kind of X-ray. A CT scan makes pictures of the inside of your body. In this procedure, the pictures will be taken in a large machine that has an opening (CT scanner). What happens before the procedure? Staying hydrated Follow instructions from your doctor about hydration, which may include:  Up to 2 hours before the  procedure - you may continue to drink clear liquids. These include water, clear fruit juice, black coffee, and plain tea.  Eating and drinking restrictions Follow instructions from your doctor about eating and drinking, which may include:  24 hours before the procedure - stop drinking caffeinated drinks. These include energy drinks, tea, soda, coffee, and hot chocolate.  8 hours before the procedure - stop eating heavy meals or foods. These include meat, fried foods, or fatty foods.  6 hours before the procedure - stop eating light meals or foods. These include toast or cereal.  6 hours before the procedure - stop drinking milk or drinks that have milk in them.  2 hours before the procedure - stop drinking clear liquids.  General instructions  Take off any jewelry.  Ask your doctor about changing or stopping your normal medicines. This is important if you take diabetes medicines or blood thinners. What happens during the procedure?  You will lie on a table with your arms above your head.  An IV tube may be put into one of your veins.  Dye may be put into the IV tube. You may feel warm or have a metal taste in your mouth.  The table you will be lying on will move into the CT scanner.  You will be able to see, hear, and talk to the person who is running the machine while you are in it. Follow that person's directions.  The machine will move around you to take pictures. Do not move.  When the machine is done taking pictures, it will be turned off.  The table will be moved out of the machine.  Your IV tube will be taken out. The procedure may vary among doctors and hospitals. What happens after the procedure?  It is up to you to  get your results. Ask when your results will be ready. Summary  A CT scan is a kind of X-ray.  A CT scan makes pictures of the inside of your body.  Follow instructions from your doctor about eating and drinking before the procedure.  You will  be able to see, hear, and talk to the person who is running the machine while you are in it. Follow that person's directions. This information is not intended to replace advice given to you by your health care provider. Make sure you discuss any questions you have with your health care provider. Document Released: 05/26/2008 Document Revised: 03/16/2016 Document Reviewed: 03/16/2016 Elsevier Interactive Patient Education  2017 [employer].   If you need a refill on your cardiac medications before your next appointment, please call your pharmacy.      Sumner Boast, PA-C  12/06/2016 7:48 AM    [facility] [address], [place], [place]  [address] Phone: [phone]; Fax: [phone]

## 2016-12-05 NOTE — Patient Instructions (Addendum)
Medication Instructions:  Your physician recommends that you continue on your current medications as directed. Please refer to the Current Medication list given to you today.   Labwork: TODAY:  CBC  Testing/Procedures: Non-Cardiac CT scanning, (CAT scanning), is a noninvasive, special x-ray that produces cross-sectional images of the body using x-rays and a computer. CT scans help physicians diagnose and treat medical conditions. For some CT exams, a contrast material is used to enhance visibility in the area of the body being studied. CT scans provide greater clarity and reveal more details than regular x-ray exams.   Follow-Up: Your physician recommends that you schedule a follow-up appointment in: DR. [place] AN APPOINTMENT  Your physician recommends that you schedule a follow-up appointment in: NEXT AVAILABLE WITH DR. [name]    Any Other Special Instructions Will Be Listed Below (If Applicable).    CT Scan A CT scan is a kind of X-ray. A CT scan makes pictures of the inside of your body. In this procedure, the pictures will be taken in a large machine that has an opening (CT scanner). What happens before the procedure? Staying hydrated Follow instructions from your doctor about hydration, which may include:  Up to 2 hours before the procedure - you may continue to drink clear liquids. These include water, clear fruit juice, black coffee, and plain tea.  Eating and drinking restrictions Follow instructions from your doctor about eating and drinking, which may include:  24 hours before the procedure - stop drinking caffeinated drinks. These include energy drinks, tea, soda, coffee, and hot chocolate.  8 hours before the procedure - stop eating heavy meals or foods. These include meat, fried foods, or fatty foods.  6 hours before the procedure - stop eating light meals or foods. These include toast or cereal.  6 hours before the procedure -  stop drinking milk or drinks that have milk in them.  2 hours before the procedure - stop drinking clear liquids.  General instructions  Take off any jewelry.  Ask your doctor about changing or stopping your normal medicines. This is important if you take diabetes medicines or blood thinners. What happens during the procedure?  You will lie on a table with your arms above your head.  An IV tube may be put into one of your veins.  Dye may be put into the IV tube. You may feel warm or have a metal taste in your mouth.  The table you will be lying on will move into the CT scanner.  You will be able to see, hear, and talk to the person who is running the machine while you are in it. Follow that person's directions.  The machine will move around you to take pictures. Do not move.  When the machine is done taking pictures, it will be turned off.  The table will be moved out of the machine.  Your IV tube will be taken out. The procedure may vary among doctors and hospitals. What happens after the procedure?  It is up to you to get your results. Ask when your results will be ready. Summary  A CT scan is a kind of X-ray.  A CT scan makes pictures of the inside of your body.  Follow instructions from your doctor about eating and drinking before the procedure.  You will be able to see, hear, and talk to the person who is running the machine while you are in it. Follow that person's directions. This  information is not intended to replace advice given to you by your health care provider. Make sure you discuss any questions you have with your health care provider. Document Released: 05/26/2008 Document Revised: 03/16/2016 Document Reviewed: 03/16/2016 Elsevier Interactive Patient Education  2017 [employer].   If you need a refill on your cardiac medications before your next appointment, please call your pharmacy.

## 2016-12-05 NOTE — Telephone Encounter (Signed)
°  New Message  Mark Cabrera verbalized that she is returning call for rn  Pt will be seen by Dr. Roxan Hockey tomorrow

## 2016-12-06 ENCOUNTER — Ambulatory Visit (INDEPENDENT_AMBULATORY_CARE_PROVIDER_SITE_OTHER)
Admission: RE | Admit: 2016-12-06 | Discharge: 2016-12-06 | Disposition: A | Discharge status: Home / Self Care | Payer: Medicare Other | Source: Ambulatory Visit | Attending: Physician Assistant | Admitting: Physician Assistant

## 2016-12-06 ENCOUNTER — Other Ambulatory Visit: Payer: Self-pay | Admitting: *Deleted

## 2016-12-06 DIAGNOSIS — Z951 Presence of aortocoronary bypass graft: Secondary | ICD-10-CM

## 2016-12-06 DIAGNOSIS — R0789 Other chest pain: Secondary | ICD-10-CM | POA: Diagnosis not present

## 2016-12-06 LAB — BASIC METABOLIC PANEL
BUN/Creatinine Ratio: 19 (ref 10–24)
BUN: 23 mg/dL (ref 8–27)
CALCIUM: 9.6 mg/dL (ref 8.6–10.2)
CO2: 25 mmol/L (ref 20–29)
CREATININE: 1.21 mg/dL (ref 0.76–1.27)
Chloride: 88 mmol/L — ABNORMAL LOW (ref 96–106)
GFR calc Af Amer: 71 mL/min/{1.73_m2} (ref >59)
GFR, EST NON AFRICAN AMERICAN: 61 mL/min/{1.73_m2} (ref >59)
GLUCOSE: 416 mg/dL — AB (ref 65–99)
Potassium: 4.2 mmol/L (ref 3.5–5.2)
SODIUM: 133 mmol/L — AB (ref 134–144)

## 2016-12-06 LAB — CBC
HEMOGLOBIN: 12.2 g/dL — AB (ref 13.0–17.7)
Hematocrit: 37.1 % — ABNORMAL LOW (ref 37.5–51.0)
MCH: 28.1 pg (ref 26.6–33.0)
MCHC: 32.9 g/dL (ref 31.5–35.7)
MCV: 86 fL (ref 79–97)
Platelets: 200 10*3/uL (ref 150–379)
RBC: 4.34 x10E6/uL (ref 4.14–5.80)
RDW: 13.8 % (ref 12.3–15.4)
WBC: 4.2 10*3/uL (ref 3.4–10.8)

## 2016-12-06 MED ORDER — IOPAMIDOL (ISOVUE-300) INJECTION 61%
80.0000 mL | Freq: Once | INTRAVENOUS | Status: AC | PRN
  Administered 2016-12-06: 80 mL via INTRAVENOUS

## 2016-12-06 NOTE — Progress Notes (Unsigned)
d 

## 2016-12-06 NOTE — Telephone Encounter (Signed)
Our CT department spoke with pt.  He is unable to be here prior to 3 PM today due to transportation issues.  He will plan on being here for CT at 4 PM as previously planned.   Pt's requests that Dr. Leonarda Salon office contact him with appointment time.

## 2016-12-07 ENCOUNTER — Encounter: Payer: Self-pay | Admitting: Thoracic Surgery (Cardiothoracic Vascular Surgery)

## 2016-12-07 ENCOUNTER — Ambulatory Visit: Payer: Medicare Other | Admitting: Thoracic Surgery (Cardiothoracic Vascular Surgery)

## 2016-12-07 ENCOUNTER — Other Ambulatory Visit: Payer: Medicare Other

## 2016-12-07 ENCOUNTER — Ambulatory Visit (INDEPENDENT_AMBULATORY_CARE_PROVIDER_SITE_OTHER): Payer: Medicare Other | Admitting: Internal Medicine

## 2016-12-07 ENCOUNTER — Ambulatory Visit (INDEPENDENT_AMBULATORY_CARE_PROVIDER_SITE_OTHER): Payer: Medicare Other | Admitting: Thoracic Surgery (Cardiothoracic Vascular Surgery)

## 2016-12-07 VITALS — BP 169/90 | HR 79 | Temp 97.8°F | Ht 68.0 in | Wt 225.9 lb

## 2016-12-07 VITALS — BP 132/83 | HR 87 | Temp 97.1°F | Resp 16 | Ht 68.0 in | Wt 225.0 lb

## 2016-12-07 DIAGNOSIS — I313 Pericardial effusion (noninflammatory): Secondary | ICD-10-CM

## 2016-12-07 DIAGNOSIS — E1142 Type 2 diabetes mellitus with diabetic polyneuropathy: Secondary | ICD-10-CM | POA: Diagnosis not present

## 2016-12-07 DIAGNOSIS — I3139 Other pericardial effusion (noninflammatory): Secondary | ICD-10-CM

## 2016-12-07 DIAGNOSIS — Z951 Presence of aortocoronary bypass graft: Secondary | ICD-10-CM | POA: Diagnosis not present

## 2016-12-07 DIAGNOSIS — I2510 Atherosclerotic heart disease of native coronary artery without angina pectoris: Secondary | ICD-10-CM | POA: Diagnosis not present

## 2016-12-07 DIAGNOSIS — Z09 Encounter for follow-up examination after completed treatment for conditions other than malignant neoplasm: Secondary | ICD-10-CM | POA: Diagnosis not present

## 2016-12-07 LAB — GLUCOSE, CAPILLARY: Glucose-Capillary: 578 mg/dL (ref 65–99)

## 2016-12-07 LAB — POCT GLYCOSYLATED HEMOGLOBIN (HGB A1C): Hemoglobin A1C: 12.1

## 2016-12-07 MED ORDER — DULAGLUTIDE 1.5 MG/0.5ML ~~LOC~~ SOAJ
0.7500 mg | SUBCUTANEOUS | 2 refills | Status: DC
Start: 2016-12-07 — End: 2016-12-08

## 2016-12-07 NOTE — Progress Notes (Signed)
[address]       [place] [address]             [phone]      Mr. Eaves returns today due to concerns about his sternal incision.  He is 66 year old man who had aortic valve replacement and coronary bypass grafting 2 in January 2018. His postoperative course, could by atrial fibrillation and post pericardiotomy syndrome with left pleural and pericardial effusions.  He was working in his garden a couple of weeks ago and noted chest soreness. Later he noticed a "blister" came up on the sternotomy scar. He used a needle to pop the blister and clear liquid drained out. 2 days later he noted that it was raised and expressed pus. He been using peroxide and an antibiotic cream on it and it cleared up. He still has some soreness around his sternotomy incision.  He saw cardiology a couple of days ago and a CT of the chest was ordered.  Past Medical History:  Diagnosis Date  . Acute diastolic congestive heart failure ([place])   . Aortic valve stenosis s/p AVR 2018  . Atrial fibrillation ([place]) - post-op CABG    04/2016 CHA2DS2VAS score = 5  . Atypical nevi   . Coronary artery disease s/p 2 vessel CABG   . Depression    "years ago"  . Diabetes mellitus   . GERD (gastroesophageal reflux disease)   . History of kidney stones   . Hyperlipidemia    hx of transaminitis secondary to statin and he has decided not to use statins secondary to potential side effects.  . Hypertension   . Nephrolithiasis   . Osteoarthritis, knee   . Sleep apnea     Central apnea. Not using cpap  . Stroke [facility]) 2007  . Transaminitis     Statin-induced  . 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 2-D echo was normal  no embolic source found.   Physical exam  66 year old man in no acute distress Sternotomy incision well-healed with 1-2 mm red area a couple of inches down from the top of the incision. Pressing on the area expressed no fluid. Sternum stable  CT chest CT CHEST WITH CONTRAST  TECHNIQUE: Multidetector CT imaging of the chest was performed during intravenous contrast administration.  CONTRAST:  55mL ISOVUE-300 IOPAMIDOL (ISOVUE-300) INJECTION 61%  COMPARISON:  Radiographs of May 09, 2016. CT scan of December 28, 2005.  FINDINGS: Cardiovascular: Atherosclerosis of thoracic aorta is noted without aneurysm or dissection. Status post coronary artery bypass graft. No pericardial effusion is noted.  Mediastinum/Nodes: No enlarged mediastinal, hilar, or axillary lymph nodes. Thyroid gland, trachea, and esophagus demonstrate no significant findings.  Lungs/Pleura: Lungs are clear. No pleural effusion or pneumothorax.  Upper Abdomen: Fatty infiltration of the liver is noted. Nodular hepatic margins are noted consistent with hepatic cirrhosis.  Musculoskeletal: No chest wall abnormality. No acute or significant osseous findings.  IMPRESSION: Status post coronary artery bypass graft.  Findings consistent with hepatic cirrhosis.  No acute abnormality seen in the chest.  Aortic Atherosclerosis (ICD10-I70.0).   Electronically Signed   By: Marijo Conception, M.D.   On: 12/07/2016 08:21  Impression  Pustulous area on sternal incision. Probably  due to an ingrown hair. There is no evidence of significant infection. He will continue to apply antibiotic ointment complete a 7 day course.  He will call if he has any further problems with his incision.  Revonda Standard Roxan Hockey, MD [facility] [phone]

## 2016-12-07 NOTE — Patient Instructions (Addendum)
Please take your trulicity 0.75mg  for the first week and increase to 1.5mg  the next week.  It's very important to control your diet and keep your blood sugars.  Please check your blood sugar at home and bring your meter in.  This is very important.    We have discontinued your glipizide for the time being.  Please continue to take the metformin 2000mg  every day.

## 2016-12-07 NOTE — Progress Notes (Signed)
CC: T2DM follow up  HPI:  [name] is a 66 y.o. here to follow up on his diabetes and hypertension.  During the beginning of the visit the patient states he hasn't taken his meds for 3 days only since his last visit 3 months ago.  However, on talking to him further he mentions he may have mixed up dosages for two of them for about a week but cannot remember which two.  He is speaking throughout the entire interview with pressured speech, it is difficult to get a word in.  He mentions he is living "off the grid" on the side of a friends barn and has very little access to a refrigerator or other appliances.  He could not stay for the diabetes educator due to an appointment with the Cardiothoracic surgeon this afternoon to address a painful sore around his sternotomy site.  His blood pressure is high today but pt states that he did not take his carvedilol today.    Please see A&P for status of the patient's chronic medical conditions  Past Medical History:  Diagnosis Date  . Acute diastolic congestive heart failure ([place])   . Aortic valve stenosis s/p AVR 2018  . Atrial fibrillation ([place]) - post-op CABG    04/2016 CHA2DS2VAS score = 5  . Atypical nevi   . Coronary artery disease s/p 2 vessel CABG   . Depression    "years ago"  . Diabetes mellitus   . GERD (gastroesophageal reflux disease)   . History of kidney stones   . Hyperlipidemia    hx of transaminitis secondary to statin and he has decided not to use statins secondary to potential side effects.  . Hypertension   . Nephrolithiasis   . Osteoarthritis, knee   . Sleep apnea     Central apnea. Not using cpap  . Stroke [facility]) 2007  . Transaminitis     Statin-induced  . 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 MRI of the brain 2007 showed acute left lateral medullary infarct in the distribution of left posterior inferior cerebral artery , narrowing of the left  vertebral with severe diminution of flow or acute occlusion. 2-D echo was normal no embolic source found.   Review of Systems:  ROS: Pulmonary: pt denies increased work of breathing, shortness of breath,  Cardiac: pt denies palpitations, he does endorse chest pain over his sternotomy site  Abdominal: pt denies abdominal pain, nausea, vomiting, or diarrhea  Physical Exam:  Vitals:   12/07/16 1350  BP: (!) 169/90  Pulse: 79  Temp: 97.8 F (36.6 C)  TempSrc: Core (Comment)  Weight: 225 lb 14.4 oz (102.5 kg)  Height: 5\' 8"  (1.727 m)   Physical Exam  Constitutional: He is oriented to person, place, and time. He appears well-developed and well-nourished.  Eyes: Right eye exhibits no discharge. Left eye exhibits no discharge. No scleral icterus.  Cardiovascular: Normal rate, regular rhythm, normal heart sounds and intact distal pulses.  Exam reveals no gallop and no friction rub.   No murmur heard. Pulmonary/Chest: Effort normal and breath sounds normal. No respiratory distress. He has no wheezes. He has no rales.  Abdominal: Soft. Bowel sounds are normal. He exhibits no distension and no mass. There is no tenderness. There is no guarding.  Neurological: He is alert and oriented to person, place, and time.    Social History   Social History  . Marital status: Divorced  Spouse name: N/A  . Number of children: 1  . Years of education: 2y college   Occupational History  .  Medicaid /disability Unemployed    following stroke in 2007   Social History Main Topics  . Smoking status: Never Smoker  . Smokeless tobacco: Never Used  . Alcohol use Yes     Comment: occasional  . Drug use: No  . Sexual activity: Not on file   Other Topics Concern  . Not on file   Social History Narrative   Single but has a girlfriend.    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       Patient has medications disability post stroke.             Family History   Problem Relation Age of Onset  . Hypertension Mother   . Diabetes Mother   . Alcohol abuse Father     Assessment & Plan:   See Encounters Tab for problem based charting.  Patient seen with Dr. Rebeca Alert

## 2016-12-08 ENCOUNTER — Other Ambulatory Visit: Payer: Self-pay | Admitting: Internal Medicine

## 2016-12-08 ENCOUNTER — Other Ambulatory Visit: Payer: Self-pay

## 2016-12-08 DIAGNOSIS — E1142 Type 2 diabetes mellitus with diabetic polyneuropathy: Secondary | ICD-10-CM

## 2016-12-08 MED ORDER — DULAGLUTIDE 0.75 MG/0.5ML ~~LOC~~ SOAJ: 0.7500 mg | SUBCUTANEOUS | 1 refills | Status: DC

## 2016-12-08 NOTE — Telephone Encounter (Signed)
Dulaglutide (TRULICITY) 1.5 VU/3.4ZG SOPN   Is not at the pharmacy. Pt is using [facility] on [address].

## 2016-12-08 NOTE — Telephone Encounter (Signed)
Pt request Trulicity rx sent to [facility] on [address]. Called pharmacy on [address] to tranfer rx . But informed rx needs to change. Stated pen comes in 1.5 mg/0.5 ml also 0.75 mg/0.5 ml ; stated will be hard, if possible, to give half of 1.5mg /0.27ml in order to give 0.75 mg as ordered. Please change. Thanks

## 2016-12-08 NOTE — Telephone Encounter (Signed)
Rx was sent to [facility] on [address] - informed pt to call the pharmacy to transfer the rx. And to call for any problems.

## 2016-12-08 NOTE — Telephone Encounter (Signed)
Call to [employer] for PA for Trulicity.  Representative there said that Pharmacy needs to run prescription as a 90 day supply and not for 30 days.  Patient's plan has a 4 pen per month limit without Authorization.  Call to [place] given information to run as a 90 day supply. Accepted and attempts to call patient message left that Clinics had called and to call the Clinics and ask to speak with [facility].  [place] [id number] reference #.  Sander Nephew, RN 12/08/2016 332 PM.

## 2016-12-08 NOTE — Telephone Encounter (Signed)
Pt called back stating, the   Dulaglutide (TRULICITY) 1.5 VH/8.4ON SOPN   Is not at [facility] on [address], so they can not transferred the Rx. Requesting this med to be filled @ [facility] on [address].

## 2016-12-08 NOTE — Progress Notes (Signed)
Internal Medicine Clinic Attending  I saw and evaluated the patient.  I personally confirmed the key portions of the history and exam documented by Dr. Shan Levans and I reviewed pertinent patient test results.  The assessment, diagnosis, and plan were formulated together and I agree with the documentation in the resident's note.  Glycemic control for [name] will be very challenging considering his limited living situation. He will need close follow-up, and extensive education with our diabetes educator, which he was unfortunately unable to stay for today. At his next visit, he'll also be important to explore whether mental health issues might be playing into his difficulties at all.  Oda Kilts, MD

## 2016-12-08 NOTE — Assessment & Plan Note (Signed)
Patient's A1c 3 months ago was 6.8 today it is 12.1.  Spoke to the patient extensively trying to get to the bottom of why this very large increase.  Patient claims only missed 3 days of his metformin and glipizide.  He is also been eating things he knows he shouldn't.  He says it's difficult because he is living off the grid now.  He has a single solar panel that powers some lights.  Otherwise he has no access to electricity.  He lives in a shelter built off the side of his friend's barn.  We discussed with the patient how we would really like to start him on insulin given his very high A1c today.  However he mentions that he can only get to his friend's refrigerator every few days So it would be very difficult if not impossible to inject himself daily.  After talking with the pharmacist we agreed on adding Trulicity 0.75mg  first week then 1.5mg  weekly to patient's regimen.  We also felt that the glipizide likely was not having much effect anymore if the patient was truly taking it as it is unlikely his pancreas is releasing much insulin with such high blood glucose.    Patient feels overall well today.  He does mention feeling having a little less energy than usual.  He was talking throughout the entire interview with pressured speech.  I believe there is an underlying psych disorder patient mentions some things that are classic for paranoia.  I'm worried that he is not being very compliant with this medication.  My hope is that with a once weekly injection his compliance would be better.  Ideally we would be able to place him on insulin but it is not an option due to his living situation.     -continue metformin 1000 mg twice a day -Added Trulicity 0.75mg  first week then 1.5mg  weekly -diabetes educator referral close follow-up early next week

## 2016-12-08 NOTE — Telephone Encounter (Signed)
Please call pt back at the Cell phone number about his  Diabetic Medications (Pharmacy) and his Blood Sugar.

## 2016-12-08 NOTE — Telephone Encounter (Signed)
Called pt to him of Trulicity rx at [facility] on [address] - no answer; left message.

## 2016-12-08 NOTE — Telephone Encounter (Signed)
hae spoken to both pt and pharmacy, medication needs PA will give to gladys and ask her to do asap

## 2016-12-11 ENCOUNTER — Other Ambulatory Visit: Payer: Self-pay | Admitting: *Deleted

## 2016-12-11 NOTE — Telephone Encounter (Signed)
Call from pt - states he received Trulicity 8.98 mg pens but not 1.5 mg pens which he needs per instructions - "continue 1.5 mg per week". Please send another rx for 1.5 mg

## 2016-12-11 NOTE — Telephone Encounter (Signed)
Called pt - no answer; left message per Dr Shan Levans also to keep appt on 10/4 to f/u on how's he tolerating 3.50 mg Trulicity before writing another rx to increase it 1.5 mg and to see [name] our diabetes coordinator. And call for any questions.

## 2016-12-11 NOTE — Telephone Encounter (Signed)
The patient can double the 0.75mg  dose to make 1.5mg  when the time comes.  If he is tolerating the medicine well and he wishes to have the 1.5mg  pens we can write a new prescription then.  I did not write for a longer script because the patient must come back this week for close follow up  and a meeting with the diabetes educator

## 2016-12-14 ENCOUNTER — Ambulatory Visit (INDEPENDENT_AMBULATORY_CARE_PROVIDER_SITE_OTHER): Payer: Medicare Other | Admitting: Dietician

## 2016-12-14 ENCOUNTER — Encounter: Payer: Self-pay | Admitting: Dietician

## 2016-12-14 ENCOUNTER — Encounter: Payer: Self-pay | Admitting: Internal Medicine

## 2016-12-14 ENCOUNTER — Ambulatory Visit (INDEPENDENT_AMBULATORY_CARE_PROVIDER_SITE_OTHER): Payer: Medicare Other | Admitting: Internal Medicine

## 2016-12-14 VITALS — BP 123/73 | HR 72 | Temp 98.2°F | Ht 68.0 in | Wt 224.7 lb

## 2016-12-14 DIAGNOSIS — I5032 Chronic diastolic (congestive) heart failure: Secondary | ICD-10-CM

## 2016-12-14 DIAGNOSIS — Z713 Dietary counseling and surveillance: Secondary | ICD-10-CM | POA: Diagnosis not present

## 2016-12-14 DIAGNOSIS — E785 Hyperlipidemia, unspecified: Secondary | ICD-10-CM

## 2016-12-14 DIAGNOSIS — Z0000 Encounter for general adult medical examination without abnormal findings: Secondary | ICD-10-CM | POA: Diagnosis not present

## 2016-12-14 DIAGNOSIS — Z23 Encounter for immunization: Secondary | ICD-10-CM | POA: Diagnosis not present

## 2016-12-14 DIAGNOSIS — E1142 Type 2 diabetes mellitus with diabetic polyneuropathy: Secondary | ICD-10-CM | POA: Diagnosis not present

## 2016-12-14 DIAGNOSIS — I2510 Atherosclerotic heart disease of native coronary artery without angina pectoris: Secondary | ICD-10-CM | POA: Diagnosis not present

## 2016-12-14 DIAGNOSIS — Z8673 Personal history of transient ischemic attack (TIA), and cerebral infarction without residual deficits: Secondary | ICD-10-CM | POA: Diagnosis not present

## 2016-12-14 LAB — GLUCOSE, CAPILLARY: Glucose-Capillary: 430 mg/dL — ABNORMAL HIGH (ref 65–99)

## 2016-12-14 MED ORDER — DULAGLUTIDE 1.5 MG/0.5ML ~~LOC~~ SOAJ: 1.5000 mg | SUBCUTANEOUS | 12 refills | Status: DC

## 2016-12-14 MED ORDER — DULAGLUTIDE 1.5 MG/0.5ML ~~LOC~~ SOAJ: 1.5000 mg | Freq: Once | SUBCUTANEOUS | 12 refills | Status: DC

## 2016-12-14 NOTE — Progress Notes (Signed)
  Medical Nutrition Therapy:  Appt start time: 1500 end time:  1605. Visit # 1  Assessment:  Primary concerns today: weight loss.  Mark Cabrera is referred to help with diabetes control and weight management lifestyle changes. He does not want to check his blood sugar at home more than one time a week and doe not want a meter. He plans to have his sister check it and callus with results. He has an out Environmental health practitioner with limited funds to buy groceries. He relie son others for transportation. He feels depression and not having a purpose in life can be barriers to his success at maintaining any changes he makes. He has a supportive friend with whom he goes out to dinner ~ 1x/week. Knee pain keeps him from doing exercise more than walking and limits that.  Preferred Learning Style: No preference indicated  Learning Readiness: Ready and Change in progress  ANTHROPOMETRICS: weight-224.7#, BMI-34.17 WEIGHT HISTORY: 220s x 11 years SLEEP:had a cpap but got rid of it  MEDICATIONS: taking but has trouble keeping up with them. He agreed to try pill boxes which were provided today BLOOD SUGAR: 430 after lunch that included regular koolaid, a1c 12% DIETARY INTAKE: 24-hr recall:  B ( AM): oatmeal or cheese,potato and spinach casserole   L ( PM): salad with tuna or chicken and vinaigrette or caesar dressing x 2 weeks D ( PM): salad with tuna or chicken and vinaigrette or caesar dressing x 2 weeks Beverages: regular soda, koolaid, water, beer 3x/month  Usual physical activity: adls and some walking  Estimated daily energy needs for weight loss 1800-2000 calories   Progress Towards Goal(s):  In progress.   Nutritional Diagnosis:  [place]-2.2 Altered nutrition-related laboratory As related to elevated blood sugars from excess carb intake and medication non adherence.  As evidenced by high A1C and blood sugar today.    Intervention:  Nutrition education and counseling about what is needed to sustain lifestyle  changes as well as suggestions for optional simple crockpot and microwave meals. Coordination of care: none  Teaching Method Utilized: Visual,auditory, Hands on  Handouts given during visit include:  Barriers to learning/adherence to lifestyle change: depression Demonstrated degree of understanding via:  Teach Back   Monitoring/Evaluation:  Dietary intake, exercise, and body weight in 4 week(s). ,[place], [place] 12/14/2016 4:40 PM.

## 2016-12-14 NOTE — Progress Notes (Signed)
   CC: Follow-up on type 2 diabetes mellitus  HPI:  [name] is a 66 y.o. male with history noted below the presents to the internal medicine clinic for follow-up of type 2 diabetes. Please see problem based starting for the status of patient's chronic medical conditions.  Past Medical History:  Diagnosis Date  . Acute diastolic congestive heart failure ([place])   . Aortic valve stenosis s/p AVR 2018  . Atrial fibrillation ([place]) - post-op CABG    04/2016 CHA2DS2VAS score = 5  . Atypical nevi   . Coronary artery disease s/p 2 vessel CABG   . Depression    "years ago"  . Diabetes mellitus   . GERD (gastroesophageal reflux disease)   . History of kidney stones   . Hyperlipidemia    hx of transaminitis secondary to statin and he has decided not to use statins secondary to potential side effects.  . Hypertension   . Nephrolithiasis   . Osteoarthritis, knee   . Sleep apnea     Central apnea. Not using cpap  . Stroke [facility]) 2007  . Transaminitis     Statin-induced  . 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 2-D echo was normal no embolic source found.    Review of Systems:  Review of Systems  Constitutional: Negative for malaise/fatigue.  Respiratory: Negative for shortness of breath.   Cardiovascular: Negative for chest pain.  Gastrointestinal: Negative for abdominal pain, nausea and vomiting.     Physical Exam:  Vitals:   12/14/16 1319  BP: 123/73  Pulse: 72  Temp: 98.2 F (36.8 C)  TempSrc: Oral  SpO2: 100%  Weight: 224 lb 11.2 oz (101.9 kg)  Height: 5\' 8"  (1.727 m)   Physical Exam  Cardiovascular: Normal rate, regular rhythm and normal heart sounds.  Exam reveals no gallop and no  friction rub.   No murmur heard. Pulmonary/Chest: Effort normal and breath sounds normal. No respiratory distress. He has no wheezes. He has no rales.  Abdominal: Soft. He exhibits no distension and no mass. There is no tenderness. There is no rebound and no guarding.  Musculoskeletal: He exhibits no edema.  Skin: Skin is warm and dry.    Assessment & Plan:   See encounters tab for problem based medical decision making.    Patient discussed with Dr. Dareen Piano

## 2016-12-14 NOTE — Patient Instructions (Addendum)
Mark Cabrera,  It was a pleasure seeing you today. Please start taking trulicity 1.5 mg weekly. Please follow up in the acute care clinic for any acute medical needs.

## 2016-12-14 NOTE — Patient Instructions (Addendum)
Hi Mark Cabrera,  It was a pleasure meeting with you today! I  forgot to give you the pill boxes so hoping to put them in the mail to you.   I'd love to help provide support for you in the beginning of your weight loss/ diet changes especially with your blood sugar being so high.   I would like to know if I may schedule you for a follow up on the same day you see the doctor next visit?   I look forward to hearing from you on my voicemail with your blood sugars so we know how you are doing.  Call anytime-  [name] [phone]

## 2016-12-16 DIAGNOSIS — Z0000 Encounter for general adult medical examination without abnormal findings: Secondary | ICD-10-CM | POA: Insufficient documentation

## 2016-12-16 NOTE — Assessment & Plan Note (Signed)
Assessment: Type 2 diabetes Patient presented to the clinic on 9/28 and found to have a hemoglobin A1c of 12.1. Prior hemoglobin A1c was 6.8, 3 months prior. At the time his hemoglobin A1C was 6.8 his diabetes regimen included metformin 1000 mg twice a day and glipizide 10 mg twice daily.  On the 9/29 clinic visit his diabetes regimen was changed to metformin 1000 mg twice a day and trulicity was added (started at 0.75mg  for one week and to titrate up to 1.5mg  the next week).  Today he states he needs pens prescribed for 1.5 mg as he has already been taking the 0.75 mg pen for one week.  He states that he does not check his blood sugars at home despite having a glucometer. He states that he does not want to check his blood sugars but will do so when he comes into the clinic.  He is scheduled to meet with our diabetic counselor, [name] today.  Plan -prescription for trulicity 1.5mg  once weekly  - diabetes counseling in office with diabetic coordinator

## 2016-12-16 NOTE — Assessment & Plan Note (Addendum)
Assessment:  Health care maintenance Due for Tdap.  Offered in office and patient accepted Discussed colon cancer screeing, colonoscopy vs fit testing.  Patient stated he would do the fit test  Plan -Tdap vaccine  - fit test  Addendum:  Patient left without fit test package.  Will discuss again at next visit

## 2016-12-18 NOTE — Progress Notes (Signed)
Internal Medicine Clinic Attending  Case discussed with Dr. Hoffman at the time of the visit.  We reviewed the resident's history and exam and pertinent patient test results.  I agree with the assessment, diagnosis, and plan of care documented in the resident's note.  

## 2017-01-04 ENCOUNTER — Emergency Department (HOSPITAL_COMMUNITY)
Admission: EM | Admit: 2017-01-04 | Discharge: 2017-01-04 | Disposition: A | Discharge status: Home / Self Care | Payer: Medicare Other | Attending: Emergency Medicine | Admitting: Emergency Medicine

## 2017-01-04 ENCOUNTER — Encounter: Payer: Self-pay | Admitting: Internal Medicine

## 2017-01-04 ENCOUNTER — Ambulatory Visit (INDEPENDENT_AMBULATORY_CARE_PROVIDER_SITE_OTHER): Payer: Medicare Other | Admitting: Internal Medicine

## 2017-01-04 ENCOUNTER — Encounter (INDEPENDENT_AMBULATORY_CARE_PROVIDER_SITE_OTHER): Payer: Self-pay

## 2017-01-04 ENCOUNTER — Encounter (HOSPITAL_COMMUNITY): Payer: Self-pay | Admitting: *Deleted

## 2017-01-04 DIAGNOSIS — R531 Weakness: Secondary | ICD-10-CM | POA: Insufficient documentation

## 2017-01-04 DIAGNOSIS — L298 Other pruritus: Secondary | ICD-10-CM | POA: Diagnosis not present

## 2017-01-04 DIAGNOSIS — Z5321 Procedure and treatment not carried out due to patient leaving prior to being seen by health care provider: Secondary | ICD-10-CM | POA: Insufficient documentation

## 2017-01-04 DIAGNOSIS — Z7984 Long term (current) use of oral hypoglycemic drugs: Secondary | ICD-10-CM

## 2017-01-04 DIAGNOSIS — Z7982 Long term (current) use of aspirin: Secondary | ICD-10-CM

## 2017-01-04 DIAGNOSIS — R631 Polydipsia: Secondary | ICD-10-CM

## 2017-01-04 DIAGNOSIS — R350 Frequency of micturition: Secondary | ICD-10-CM | POA: Insufficient documentation

## 2017-01-04 DIAGNOSIS — B379 Candidiasis, unspecified: Secondary | ICD-10-CM

## 2017-01-04 DIAGNOSIS — E1149 Type 2 diabetes mellitus with other diabetic neurological complication: Secondary | ICD-10-CM | POA: Diagnosis present

## 2017-01-04 DIAGNOSIS — E1142 Type 2 diabetes mellitus with diabetic polyneuropathy: Secondary | ICD-10-CM

## 2017-01-04 LAB — CBC
HCT: 36.9 % — ABNORMAL LOW (ref 39.0–52.0)
Hemoglobin: 13.1 g/dL (ref 13.0–17.0)
MCH: 29.1 pg (ref 26.0–34.0)
MCHC: 35.5 g/dL (ref 30.0–36.0)
MCV: 82 fL (ref 78.0–100.0)
PLATELETS: 205 10*3/uL (ref 150–400)
RBC: 4.5 MIL/uL (ref 4.22–5.81)
RDW: 12.8 % (ref 11.5–15.5)
WBC: 5.5 10*3/uL (ref 4.0–10.5)

## 2017-01-04 LAB — URINALYSIS, ROUTINE W REFLEX MICROSCOPIC
BILIRUBIN URINE: NEGATIVE
Bacteria, UA: NONE SEEN
HGB URINE DIPSTICK: NEGATIVE
Ketones, ur: 5 mg/dL — AB
Leukocytes, UA: NEGATIVE
NITRITE: NEGATIVE
PROTEIN: NEGATIVE mg/dL
SPECIFIC GRAVITY, URINE: 1.026 (ref 1.005–1.030)
pH: 5 (ref 5.0–8.0)

## 2017-01-04 LAB — BASIC METABOLIC PANEL
Anion gap: 12 (ref 5–15)
BUN: 17 mg/dL (ref 6–20)
CALCIUM: 9.6 mg/dL (ref 8.9–10.3)
CO2: 24 mmol/L (ref 22–32)
CREATININE: 1.15 mg/dL (ref 0.61–1.24)
Chloride: 91 mmol/L — ABNORMAL LOW (ref 101–111)
GFR calc Af Amer: >60 mL/min (ref >60)
GFR calc non Af Amer: >60 mL/min (ref >60)
GLUCOSE: 501 mg/dL — AB (ref 65–99)
Potassium: 3.8 mmol/L (ref 3.5–5.1)
Sodium: 127 mmol/L — ABNORMAL LOW (ref 135–145)

## 2017-01-04 LAB — GLUCOSE, CAPILLARY: GLUCOSE-CAPILLARY: 580 mg/dL — AB (ref 65–99)

## 2017-01-04 LAB — CBG MONITORING, ED: GLUCOSE-CAPILLARY: 479 mg/dL — AB (ref 65–99)

## 2017-01-04 MED ORDER — NYSTATIN POWD: 1.0000 "application " | Freq: Two times a day (BID) | 1 refills | Status: DC

## 2017-01-04 MED ORDER — INSULIN GLARGINE 100 UNIT/ML SOLOSTAR PEN: 10.0000 [IU] | PEN_INJECTOR | Freq: Every day | SUBCUTANEOUS | 11 refills | Status: DC

## 2017-01-04 NOTE — ED Triage Notes (Signed)
To ED for further eval of hyperglycemia. States his doctors have been watching his sugars for the past 3 months and 500 has been the lowest its been. Complains of frequent urination and weakness. No pain.

## 2017-01-04 NOTE — Progress Notes (Signed)
   CC: Follow-up on type 2 diabetes mellitus  HPI:  [name] is a 66 y.o. male with history noted below that presents to the internal medicine clinic for follow-up on type 2 diabetes. Please see problem based charting for the status of patient's chronic medical conditions.  Past Medical History:  Diagnosis Date  . Acute diastolic congestive heart failure ([place])   . Aortic valve stenosis s/p AVR 2018  . Atrial fibrillation ([place]) - post-op CABG    04/2016 CHA2DS2VAS score = 5  . Atypical nevi   . Coronary artery disease s/p 2 vessel CABG   . Depression    "years ago"  . Diabetes mellitus   . GERD (gastroesophageal reflux disease)   . History of kidney stones   . Hyperlipidemia    hx of transaminitis secondary to statin and he has decided not to use statins secondary to potential side effects.  . Hypertension   . Nephrolithiasis   . Osteoarthritis, knee   . Sleep apnea     Central apnea. Not using cpap  . Stroke [facility]) 2007  . Transaminitis     Statin-induced  . 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 2-D echo was normal no embolic source found.    Review of Systems:  Review of Systems  Respiratory: Negative for cough.   Cardiovascular: Negative for chest pain.  Gastrointestinal: Negative for abdominal pain.  Genitourinary: Positive for frequency.  Endo/Heme/Allergies: Positive for polydipsia.     Physical Exam:  Vitals:   01/04/17 1538 01/04/17 1557  BP: (!) 146/81 129/82  Pulse: 87 88  Temp: 98.1 F (36.7 C)   TempSrc: Oral   SpO2: 100%   Weight: 214 lb (97.1 kg)   Height: 5\' 8"  (1.727 m)    Physical Exam  Constitutional: He is well-developed, well-nourished, and in no  distress.  Cardiovascular: Normal rate, regular rhythm and normal heart sounds.  Exam reveals no gallop and no friction rub.   No murmur heard. Pulmonary/Chest: Effort normal and breath sounds normal. No respiratory distress. He has no wheezes. He has no rales.    Assessment & Plan:   See encounters tab for problem based medical decision making.    Patient seen with Dr. Dareen Piano

## 2017-01-04 NOTE — Patient Instructions (Signed)
Mark Cabrera,  It was a pleasure seeing you today.  Please start taking lantus 10 units at night.

## 2017-01-04 NOTE — ED Notes (Signed)
Pt up to nurse first desk stating that he is no longer willing to wait to be seen. LWBS after triage.

## 2017-01-05 ENCOUNTER — Telehealth: Payer: Self-pay | Admitting: *Deleted

## 2017-01-05 NOTE — Telephone Encounter (Signed)
Sounds good. Thank you

## 2017-01-05 NOTE — Telephone Encounter (Signed)
Call from pt - states he has questions about taking insulin with PO diab medications; which meds to take or not take. Also went to ED yesterday and after 5 hrs, he left so he has not taken any insulin but did pick it up - unsure if he needs to take it now or wait. He called [facility], left message.  I will forward his questions to PCP and [facility]. Telephone# [phone].

## 2017-01-05 NOTE — Telephone Encounter (Signed)
Mr. Novacek calls with questions about his medicine. He left the ED yesterday before being seen and his blood sugar was more than 500 here yesterday prior to going to the ED.   He has his insulin now and was not sure what other diabetes medicine to take with it or when and how much insulin to take. Instructed him to continue the metformin as ordered.(stop trulicity which he knew) Take 10 units lantus when he gets home and continue to take 10  Units lantus daily. He will look for his meter and strips and ask his sister in law to come over to check his bloods sugar. He agrees to drink more water and other low calorie/sugar free beverages even if he feels like he is peeing too much. Advised him to call as needed. Suggest follow up in 1-2 weeks.

## 2017-01-09 DIAGNOSIS — B379 Candidiasis, unspecified: Secondary | ICD-10-CM | POA: Insufficient documentation

## 2017-01-09 NOTE — Assessment & Plan Note (Signed)
Assessment: Uncontrolled type 2 diabetes Patient presented to the clinic on 9/28 and found to have a hemoglobin A1c of 12.1. Prior hemoglobin A1c was 6.8, 3 months prior. At the time his hemoglobin A1C was 6.8 his diabetes regimen included metformin 1000 mg twice a day and glipizide 10 mg twice daily.  On the 9/29 clinic visit his diabetes regimen was changed to metformin 1000 mg twice a day and trulicity was added (started at 0.75mg  for one week and to titrate up to 1.5mg  the next week).   Patient reported on clinic visit 9/29 that he did not want to consider insulin.  Today patient presents with blood glucose of 580 with feelings of malaise, dry mouth and polyuria.  He states that now he would like to start insulin.  I recommended starting Lantus 10 units at night discontinuing Trulicity and continuing metformin. I also recommended getting stat labs and IV fluids in the ED as the clinic was closing up.   Plan -Start Lantus 10 units at night -Continue metformin 1000 mg twice a day -Start taking Trulicity - Transported to the ED from clinic - follow up in 2 weeks - told to check blood sugars 2 times a day and call the clinic if running too high or too low

## 2017-01-09 NOTE — Assessment & Plan Note (Signed)
Assessment: candida albicans Patient states that he has a one-month history of pruritus and redness on his penis. On exam it appears that patient has Candida albicans. Will treat with nystatin powder.  Plan -Nystatin powder

## 2017-01-09 NOTE — Telephone Encounter (Addendum)
called patient after he left a message that his blood sugar today after eating is 482. He confirmed he is taking metformin as directed. And has not taken lantus yet for today. He agrees to increase his dose of lantus 2 units each day and check his blood sugar every morning until his blood sugar is < 150 mg/dl. He also agrees to an appointment tomorrow at 2:45 PM.

## 2017-01-09 NOTE — Telephone Encounter (Signed)
Sounds good.  If he could come in this week that would be better.

## 2017-01-09 NOTE — Telephone Encounter (Signed)
Calling to follow up on blood sugars, "lowered some, not as dizzy, 413 yesterday in the morning, had eaten late the night before. Feeling better but not completely well.  Taking insulin daily, agrees to an appointment here this week or next and to increase his blood sugar daily until his fasting is <150 if okayed by doctor. Marland Kitchen

## 2017-01-10 ENCOUNTER — Ambulatory Visit (INDEPENDENT_AMBULATORY_CARE_PROVIDER_SITE_OTHER): Payer: Medicare Other | Admitting: Internal Medicine

## 2017-01-10 ENCOUNTER — Encounter: Payer: Self-pay | Admitting: Internal Medicine

## 2017-01-10 ENCOUNTER — Encounter (INDEPENDENT_AMBULATORY_CARE_PROVIDER_SITE_OTHER): Payer: Self-pay

## 2017-01-10 VITALS — BP 135/71 | HR 88 | Temp 98.0°F | Ht 68.0 in | Wt 211.2 lb

## 2017-01-10 DIAGNOSIS — E785 Hyperlipidemia, unspecified: Secondary | ICD-10-CM | POA: Diagnosis not present

## 2017-01-10 DIAGNOSIS — E1149 Type 2 diabetes mellitus with other diabetic neurological complication: Secondary | ICD-10-CM | POA: Diagnosis present

## 2017-01-10 DIAGNOSIS — Z951 Presence of aortocoronary bypass graft: Secondary | ICD-10-CM

## 2017-01-10 DIAGNOSIS — E1165 Type 2 diabetes mellitus with hyperglycemia: Secondary | ICD-10-CM | POA: Diagnosis present

## 2017-01-10 DIAGNOSIS — K219 Gastro-esophageal reflux disease without esophagitis: Secondary | ICD-10-CM

## 2017-01-10 DIAGNOSIS — Z794 Long term (current) use of insulin: Secondary | ICD-10-CM | POA: Diagnosis not present

## 2017-01-10 DIAGNOSIS — M1710 Unilateral primary osteoarthritis, unspecified knee: Secondary | ICD-10-CM

## 2017-01-10 DIAGNOSIS — L989 Disorder of the skin and subcutaneous tissue, unspecified: Secondary | ICD-10-CM | POA: Diagnosis not present

## 2017-01-10 DIAGNOSIS — I4891 Unspecified atrial fibrillation: Secondary | ICD-10-CM

## 2017-01-10 DIAGNOSIS — Z7901 Long term (current) use of anticoagulants: Secondary | ICD-10-CM | POA: Diagnosis not present

## 2017-01-10 DIAGNOSIS — I110 Hypertensive heart disease with heart failure: Secondary | ICD-10-CM

## 2017-01-10 DIAGNOSIS — I2510 Atherosclerotic heart disease of native coronary artery without angina pectoris: Secondary | ICD-10-CM

## 2017-01-10 DIAGNOSIS — E1142 Type 2 diabetes mellitus with diabetic polyneuropathy: Secondary | ICD-10-CM

## 2017-01-10 DIAGNOSIS — I5031 Acute diastolic (congestive) heart failure: Secondary | ICD-10-CM

## 2017-01-10 LAB — POCT URINALYSIS DIPSTICK
Bilirubin, UA: NEGATIVE
Blood, UA: NEGATIVE
Glucose, UA: >=1000
Ketones, UA: NEGATIVE
Leukocytes, UA: NEGATIVE
Nitrite, UA: NEGATIVE
Protein, UA: NEGATIVE
Spec Grav, UA: <=1.005 — AB
Urobilinogen, UA: 0.2 U/dL
pH, UA: 5.5

## 2017-01-10 LAB — BASIC METABOLIC PANEL WITH GFR
Anion gap: 15 (ref 5–15)
BUN: 15 mg/dL (ref 6–20)
CO2: 26 mmol/L (ref 22–32)
Calcium: 9.6 mg/dL (ref 8.9–10.3)
Chloride: 87 mmol/L — ABNORMAL LOW (ref 101–111)
Creatinine, Ser: 1.37 mg/dL — ABNORMAL HIGH (ref 0.61–1.24)
GFR calc Af Amer: 60 mL/min — ABNORMAL LOW
GFR calc non Af Amer: 51 mL/min — ABNORMAL LOW
Glucose, Bld: 579 mg/dL (ref 65–99)
Potassium: 3.9 mmol/L (ref 3.5–5.1)
Sodium: 128 mmol/L — ABNORMAL LOW (ref 135–145)

## 2017-01-10 LAB — GLUCOSE, CAPILLARY: Glucose-Capillary: >600 mg/dL (ref 65–99)

## 2017-01-10 MED ORDER — INSULIN LISPRO 100 UNIT/ML (KWIKPEN): 5.0000 [IU] | PEN_INJECTOR | Freq: Two times a day (BID) | SUBCUTANEOUS | 0 refills | Status: DC

## 2017-01-10 MED ORDER — INSULIN GLARGINE 100 UNIT/ML SOLOSTAR PEN: 25.0000 [IU] | PEN_INJECTOR | Freq: Every day | SUBCUTANEOUS | 11 refills | Status: DC

## 2017-01-10 NOTE — Assessment & Plan Note (Signed)
Patient's last A1c on 12/07/2016 was 12.1.  The patient has been prescribed Lantus 10 units nightly and metformin 1000 mg 2 times daily.  Patient's blood sugar measurements at home have been in the 400-500s range. Urinalysis done on 01/04/2017 showed greater than 500 glucose in urine and 5 ketones.The patient states that he is compliant with his medication and that he has increased his lantus to 12units as recommended by [name].   Patient's capillary glucose was greater than 600 during this visit 01/10/2017 and his serum glucose was 579 per his bmp. Urinalysis did not show any presence of ketones but there was >1000 glucose.   The patient states that he lives in a barn and does not have ready access to a refrigerator or stove. He has to walk to his friend's house to store his insulin and to get his meals. He states that he asks his friends and family to help drive him to a grocery store. He has been eating microwave meals, sugary drinks, and fruits.   The patient states that he has blurry vision, dizziness, and shortness of breath. He denies abdominal pain, headaches, and chest pain.   -Increased Lantus from 12units to 25 unity -Started patient on Humalog 5 units two times daily with meals -Instructed patient to check his blood glucose regularly when fasting and an hour after meals -Arranged for Ms. Barry Brunner to call the patient on Friday 01/12/17 and discuss adjustment in insulin regimen based on the patient's blood glucose readings.

## 2017-01-10 NOTE — Progress Notes (Signed)
   CC: Diabetes follow-up  HPI:  Mark Cabrera is a 66 y.o. male with past medical history of acute diastolic congestive heart failure, coronary disease status post two-vessel CABG, diabetes mellitus, GERD, hyperlipidemia, hypertension, and osteoarthritis of the knee, atrial fibrillation who presents for diabetes follow-up. Please see problem based charting for evaluation, assessment, and plan.  Past Medical History:  Diagnosis Date  . Acute diastolic congestive heart failure ([place])   . Aortic valve stenosis s/p AVR 2018  . Atrial fibrillation ([place]) - post-op CABG    04/2016 CHA2DS2VAS score = 5  . Atypical nevi   . Coronary artery disease s/p 2 vessel CABG   . Depression    "years ago"  . Diabetes mellitus   . GERD (gastroesophageal reflux disease)   . History of kidney stones   . Hyperlipidemia    hx of transaminitis secondary to statin and he has decided not to use statins secondary to potential side effects.  . Hypertension   . Nephrolithiasis   . Osteoarthritis, knee   . Sleep apnea     Central apnea. Not using cpap  . Stroke [facility]) 2007  . Transaminitis     Statin-induced  . 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 2-D echo was normal no embolic source found.   Review of Systems:    Review of Systems  Eyes: Positive for blurred vision.  Respiratory: Positive for shortness of breath.   Cardiovascular: Negative for chest pain.  Gastrointestinal: Positive for nausea. Negative for vomiting.  Genitourinary: Positive for frequency.  Neurological: Positive for dizziness. Negative for headaches.  Endo/Heme/Allergies: Positive for polydipsia.   Physical Exam:  There were no vitals filed for this  visit.  Physical Exam  Constitutional: He is oriented to person, place, and time. He appears well-developed and well-nourished. He appears distressed.  HENT:  Head: Normocephalic and atraumatic.  Mucous membranes moist   Eyes: Conjunctivae are normal.  Cardiovascular: Normal rate, regular rhythm and normal heart sounds.   Pulmonary/Chest: Effort normal and breath sounds normal. No respiratory distress. He has no wheezes.  Abdominal: Soft. Bowel sounds are normal. He exhibits no distension. There is no tenderness.  Neurological: He is alert and oriented to person, place, and time.  Skin:  Tenting noticed   Psychiatric: He has a normal mood and affect. His behavior is normal. Judgment and thought content normal.     Assessment & Plan:   See Encounters Tab for problem based charting.  Patient seen with Dr. Lynnae January

## 2017-01-10 NOTE — Patient Instructions (Signed)
It was a pleasure to see you today Mark Cabrera. Please make the following changes:  -Please increase lantus to 25u nightly -Please start taking humalog 5units with meals twice a day -Follow up with [name]'s recommendation when she calls you Friday 01/12/17  If you have any questions or concerns, please call our clinic at [phone] between 9am-5pm and after hours call [phone] and ask for the internal medicine resident on call. If you feel you are having a medical emergency please call 911.   Thank you, we look forward to help you remain healthy!  Lars Mage, MD Internal Medicine PGY1

## 2017-01-12 ENCOUNTER — Telehealth: Payer: Self-pay | Admitting: Dietician

## 2017-01-12 NOTE — Telephone Encounter (Signed)
Called to speak with Mark Cabrera. No answer, left message. I share [name]'s concern about him, recommended he go to the ER for evaluation. At the very least, he needs to come back in Monday.

## 2017-01-12 NOTE — Telephone Encounter (Signed)
Calling to follow up blood sugars, feels tired, peeing a lot, staggering dizzy. Drinking tons of water.  16 ounces x 6 or more servings Thursday blood sugars 438/580 Today Fasting  8 am 465/noon 548 after he ate and took humalog.  Lantus- 24 units in the mornings x 2 days 11-130 5 units Humalog ate a stalk bokchoy, 3 pieces of liver pudding, tomatoes, no bread, 3/4 of bottle of beer Eating oranges 7-8 today since 11 am, drinking water  Doesn't want to go to emergency room. Afraid of too much fluid

## 2017-01-14 NOTE — Progress Notes (Signed)
Internal Medicine Clinic Attending  Case discussed with Dr. Hoffman at the time of the visit.  We reviewed the resident's history and exam and pertinent patient test results.  I agree with the assessment, diagnosis, and plan of care documented in the resident's note.  

## 2017-01-15 NOTE — Telephone Encounter (Signed)
Called pt, sch appt for tues am [facility]

## 2017-01-15 NOTE — Progress Notes (Signed)
Internal Medicine Clinic Attending  I saw and evaluated the patient.  I personally confirmed the key portions of the history and exam documented by Dr. Chundi and I reviewed pertinent patient test results.  The assessment, diagnosis, and plan were formulated together and I agree with the documentation in the resident's note. 

## 2017-01-16 ENCOUNTER — Encounter: Payer: Self-pay | Admitting: Internal Medicine

## 2017-01-16 ENCOUNTER — Ambulatory Visit: Payer: Medicare Other

## 2017-01-16 ENCOUNTER — Telehealth: Payer: Self-pay | Admitting: Dietician

## 2017-01-16 NOTE — Telephone Encounter (Signed)
Called patient to follow up on blood sugars since he missed his appointment this am. He reports that his fasting blood sugar is 374 today. monday night 496 Monday am 362 Saturday am- 503 Saturday- after lunch- 548 Friday am   465 after Meds Thursday 438 fasting, 580 after lunch Thursday  He says he "Feels terrible" "No energy", but better.  He is taking Lantus 25 one time a day, Novolog 5 units one to two  times a day, and pills.  Transportation was why he missed his appointment this am, he asks about a Hotel manager coming out to his home. When asked about coming in at another time this week he says he he cannot come Thursday or Friday.  He agrees to add a correction amount of Novolog if approved by doctors.

## 2017-01-16 NOTE — Telephone Encounter (Signed)
Spoke to Dr. Heber Broomfield who agrees to have him add a correction amount of Novolog to the 5 unit meal doses.   Check blood sugar before each meal. < 200 Take usual 5 units novolog meal dose 201- 300 add 3 units Novolog to meal dose 301 - 400 add 5 units Novolog to meal dose 401 - 500 add 7 units Novolog to meal dose 501 - 600 add 9units Novolog to meal dose > 600 add 10 units and call office, if office is closed call after hours resident for instructions. Left message for return call.

## 2017-01-16 NOTE — Telephone Encounter (Signed)
Spoke to Mark Cabrera who verbalized understanding and wrote these instructions down. He was able to teach back.

## 2017-02-04 DIAGNOSIS — R42 Dizziness and giddiness: Secondary | ICD-10-CM | POA: Diagnosis not present

## 2017-02-04 DIAGNOSIS — R404 Transient alteration of awareness: Secondary | ICD-10-CM | POA: Diagnosis not present

## 2017-02-13 ENCOUNTER — Telehealth: Payer: Self-pay | Admitting: Dietician

## 2017-02-13 NOTE — Telephone Encounter (Signed)
Called to follow up on diabetes care- high blood sugars: Mr, Anderle reports that his sugars are in the low 300s, sometimes 250, 1x- 192, 279, 280s, 196 one time, this am 333, before eating or meds in the am is. Passed out about 1 week ago- thinks his blood sugar was low or high.He took meds, went to a friends house took Humalog right before he ate, 30-60 minutes later he got very hungry about an hour after he had eaten, he could feel it and felt bad, got up to read label, staggered and vision was blurry, next thing he knew he had passed out, ambulance came and his sugar was 196, that morning it was 214. They said his blood pressure was okay.  He is taking Lantus-24 units one time a day  humalog- up to 16 units one time 8-10 units twice a day mostly, increased it 12 units twice a day but after having low blood sugar back it down to 8-10 units before each meal. He is taking  Metformin as well.  Advised follow up appointment with doctors- he agreed to this plan and his call was transferred to the front office.   Karime Scheuermann, [name], RD, certified diabetes educator 02/13/2017 1:37 PM.

## 2017-02-14 ENCOUNTER — Ambulatory Visit (INDEPENDENT_AMBULATORY_CARE_PROVIDER_SITE_OTHER): Payer: Medicare Other | Admitting: Internal Medicine

## 2017-02-14 ENCOUNTER — Encounter: Payer: Self-pay | Admitting: Dietician

## 2017-02-14 ENCOUNTER — Encounter: Payer: Self-pay | Admitting: Internal Medicine

## 2017-02-14 ENCOUNTER — Encounter (INDEPENDENT_AMBULATORY_CARE_PROVIDER_SITE_OTHER): Payer: Self-pay

## 2017-02-14 ENCOUNTER — Other Ambulatory Visit: Payer: Self-pay

## 2017-02-14 ENCOUNTER — Ambulatory Visit (INDEPENDENT_AMBULATORY_CARE_PROVIDER_SITE_OTHER): Payer: Medicare Other | Admitting: Dietician

## 2017-02-14 DIAGNOSIS — Z7982 Long term (current) use of aspirin: Secondary | ICD-10-CM

## 2017-02-14 DIAGNOSIS — E1165 Type 2 diabetes mellitus with hyperglycemia: Secondary | ICD-10-CM | POA: Diagnosis not present

## 2017-02-14 DIAGNOSIS — E1149 Type 2 diabetes mellitus with other diabetic neurological complication: Secondary | ICD-10-CM

## 2017-02-14 DIAGNOSIS — Z713 Dietary counseling and surveillance: Secondary | ICD-10-CM

## 2017-02-14 DIAGNOSIS — Z794 Long term (current) use of insulin: Secondary | ICD-10-CM | POA: Diagnosis not present

## 2017-02-14 DIAGNOSIS — I2510 Atherosclerotic heart disease of native coronary artery without angina pectoris: Secondary | ICD-10-CM

## 2017-02-14 DIAGNOSIS — E1142 Type 2 diabetes mellitus with diabetic polyneuropathy: Secondary | ICD-10-CM

## 2017-02-14 LAB — GLUCOSE, CAPILLARY: GLUCOSE-CAPILLARY: 369 mg/dL — AB (ref 65–99)

## 2017-02-14 MED ORDER — INSULIN GLARGINE 100 UNIT/ML SOLOSTAR PEN: 28.0000 [IU] | PEN_INJECTOR | Freq: Every day | SUBCUTANEOUS | 11 refills | Status: DC

## 2017-02-14 MED ORDER — NITROGLYCERIN 0.4 MG SL SUBL: 0.4000 mg | SUBLINGUAL_TABLET | SUBLINGUAL | 1 refills | Status: DC | PRN

## 2017-02-14 NOTE — Progress Notes (Signed)
Internal Medicine Clinic Attending  Case discussed with Dr. Wallace at the time of the visit.  We reviewed the resident's history and exam and pertinent patient test results.  I agree with the assessment, diagnosis, and plan of care documented in the resident's note.  

## 2017-02-14 NOTE — Progress Notes (Signed)
  Medical Nutrition Therapy:  Appt start time: 0925 end time:  0955. Time spent in CGM placement was  10 minutes, time spent in MNT counseling was 20 minutes Visit # 2  Assessment:  Primary concerns today: weight loss.  Mark Cabrera is referred for help with diabetes control and weight management lifestyle changes.. He does not eat "regular meals", but when he thinks about it which is about 2x/day. He relies on others for transportation. PHQ-2 score is a zero today. He is asking appropriate questions about his diabetes and diabetes medicine indicating a willingness to learn and is interested in learning more about Continuous glucose monitors.  He agrees to have the [employer] CGM placed today.  Freestyle Libre Pro CGM sensor placed and started. Patient was educated about wearing sensor, keeping food, activity and medication log and when to call office. Follow up was arranged with the patient.   ANTHROPOMETRICS: weight-212.4#, BMI-32.3- he's lost 12.3# in past 2 months SLEEP:had a cpap but got rid of it  MEDICATIONS: lantus 28 units, Humalog 1-12 units twice daily, metformin 1000 mg twice day.forgets metformin or Humalog about 1x/week BLOOD SUGAR:, 200-300s, a1c 12.1% DIETARY INTAKE: 24-hr recall:  B ( 830 AM): sausage, eggs, fruit and cottage cheese and coffee   Usual physical activity: adls, chops wood and light walking  Estimated daily energy needs for weight loss 1800-2000 calories  Progress Towards Goal(s):  Some progress.   Nutritional Diagnosis:  [place]-2.2 Altered nutrition-related laboratory As related to elevated blood sugars from excess carb intake and medication non adherence.  As evidenced by high A1C and blood sugar today.    Intervention:  Nutrition education and counseling about hypoglycemia prevention and treatment, diabetes medicine, ways to organize his diabetes self care to help him remember to take his medicine and CGM Coordination of care: none Teaching Method  Utilized: Visual,auditory, Hands on  Handouts given during visit include: CGM folder Barriers to learning/adherence to lifestyle change: depression Demonstrated degree of understanding via:  Teach Back   Monitoring/Evaluation:  Dietary intake, exercise, and body weight in 1 week(s). ,Mark Cabrera, Mark Cabrera, Mark Cabrera 02/14/2017 10:54 AM.

## 2017-02-14 NOTE — Assessment & Plan Note (Signed)
Assessment: Uncontrolled DM with fluctuating blood sugars.  Had 1 possible episode of hypoglycemia but otherwise his values have been elevated.  Does not report symptoms of vision change, polyuria or polydipsia.  He is on Lantus, Humalog BID, and metformin BID.  Last A1c in Sept was 12.1.  He is due for another A1c at end of this month.  Plan: - CGM placed on patient today with the help of our DM educator, [name] Plyler - RTC 1 week - continue Humalog sliding scale as instructed to him by DM educator.  He has this scale written down and essentially ranges from 6-14 units based on CBG.  Advised patient to be more consistent in the future regarding his blood sugar checks - continue metformin 1000mg  BID -increase Lantus to 28 units daily.  This represents an approximate 20% increase

## 2017-02-14 NOTE — Patient Instructions (Signed)
FOLLOW-UP INSTRUCTIONS When: in 1 week For: for titration of your insulin based on continuous glucose monitor What to bring: all your current medications   We increased your Lantus by about 20%.  Please take 28 units once daily before bed.  Please follow up with [place] in 1 week

## 2017-02-14 NOTE — Assessment & Plan Note (Signed)
He asked for a refill of his NTG tabs due to expiration of his old ones.  A new refill was provided.

## 2017-02-14 NOTE — Progress Notes (Signed)
CC: here for f/u of blood sugars  HPI:  Mark Cabrera is a 66 y.o. man with PMHx as below.  Here for f/u of blood sugars as they have been running high.  Tells me that he takes Lantus 24 units QHS and Humalog 8-10 units BID.  Checks BS in the morning and "once in a blue moon" will check before his afternoon humalog dose.  Morning sugars are running in the low 300 range on a consistent basis.  Lowest was in the 190s.  Reports he does not miss doses of Lantus.  Reports he misses about 1 dose per week of Humalog.  Thinks he had an episode of possible hypoglycemia last week when he got light headed with blurry vision after walking to the kitchen at friends house.  However, glucose at that time was 200.  No other similar episodes.  He is agreeable today to continuous glucose monitoring.   He also notes that about 2 months ago a blister came up on his anterior chest at site of previous surgery.  He popped this blister then a couple days later there was some purulence.  None now, however, states with certain position movements and at night he feels that the sternotomy wires may be pulling.  Past Medical History:  Diagnosis Date  . Acute diastolic congestive heart failure ([place])   . Aortic valve stenosis s/p AVR 2018  . Atrial fibrillation ([place]) - post-op CABG    04/2016 CHA2DS2VAS score = 5  . Atypical nevi   . Coronary artery disease s/p 2 vessel CABG   . Depression    "years ago"  . Diabetes mellitus   . GERD (gastroesophageal reflux disease)   . History of kidney stones   . Hyperlipidemia    hx of transaminitis secondary to statin and he has decided not to use statins secondary to potential side effects.  . Hypertension   . Nephrolithiasis   . Osteoarthritis, knee   . Sleep apnea     Central apnea. Not using cpap  . Stroke [facility]) 2007  . Transaminitis     Statin-induced  . 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 2-D echo was normal no embolic source found.   Review of Systems:  Please see pertinent ROS reviewed in HPI and problem based charting.   Physical Exam:  Vitals:   02/14/17 0912  BP: 113/70  Pulse: 77  Temp: (!) 97.5 F (36.4 C)  TempSrc: Oral  SpO2: 100%  Weight: 212 lb 6.4 oz (96.3 kg)  Height: 5\' 8"  (1.727 m)   Physical Exam  Constitutional: He is oriented to person, place, and time and well-developed, well-nourished, and in no distress.  HENT:  Head: Normocephalic and atraumatic.  Cardiovascular:  Large midline anterior chest scar.  There is no tenderness, erythema, or warmth along the scar.  There are no vesicles or papules either.  Pulmonary/Chest: Effort normal.  Neurological: He is alert and oriented to person, place, and time.  Skin: Skin is warm and dry.  Psychiatric: Mood and affect normal.     Assessment & Plan:   See Encounters Tab for problem based charting.  Patient discussed with Dr. Dareen Piano.  No problem-specific Assessment & Plan notes found for  this encounter.

## 2017-02-21 ENCOUNTER — Encounter: Payer: Self-pay | Admitting: Internal Medicine

## 2017-02-21 ENCOUNTER — Ambulatory Visit (INDEPENDENT_AMBULATORY_CARE_PROVIDER_SITE_OTHER): Payer: Medicare Other | Admitting: Internal Medicine

## 2017-02-21 DIAGNOSIS — Z794 Long term (current) use of insulin: Secondary | ICD-10-CM

## 2017-02-21 DIAGNOSIS — E1142 Type 2 diabetes mellitus with diabetic polyneuropathy: Secondary | ICD-10-CM

## 2017-02-21 DIAGNOSIS — Z7982 Long term (current) use of aspirin: Secondary | ICD-10-CM

## 2017-02-21 DIAGNOSIS — E1149 Type 2 diabetes mellitus with other diabetic neurological complication: Secondary | ICD-10-CM

## 2017-02-21 NOTE — Progress Notes (Signed)
Internal Medicine Clinic Attending  Case discussed with Dr. Juleen China  at the time of the visit.  We reviewed the resident's history and exam and pertinent patient test results.  I agree with the assessment, diagnosis, and plan of care documented in the resident's note.  As a correction, chief complaint is follow up of diabetes.

## 2017-02-21 NOTE — Patient Instructions (Addendum)
FOLLOW-UP INSTRUCTIONS When: in 1 week For: continuous glucose monitoring What to bring: bring your current medications  We are increasing your insulin about 20 percent total.  Please increase your Lantus to 33 units daily  For your Humalog, increase your sliding scale to 10-16 units based on glucose reading. Glucose 200-300 = 10 units 300-400 = 12 units 400-500 = 14 units > 500 = 16 units  Please be consistent with checking your blood sugars

## 2017-02-21 NOTE — Assessment & Plan Note (Signed)
Assessment: [name] wore the CGM for 3.5 days. The average reading was 386, % time in target was 0, % time below target was 0, and % time above target was. 100. Intervention will be to increase his total daily dose of insulin by about 20%. The patient will be scheduled to see physician in [facility] for a final appointment.   Plan: - Increase Lantus to 33 units daily - Increase sliding scale humalog from 6-14 units to 10-16 units based on glucose reading - emphasized with patient the importance of consistent insulin administration and adherence -RTC 1 week

## 2017-02-21 NOTE — Progress Notes (Signed)
   CC: here for f/u for CGM monitoring  HPI:  [name] is a 66 y.o. man with PMHx as below.  He is here today for f/u after having CGM placed 1 week ago.  Please see A&P for details of today's visit.  [name] reports that his increased Lantus dose has not been an issue and he has been taking his Humalog and metformin as prescribed.  His last dose of Lantus was yesterday morning.  His last dose of Humalog was yesterday at lunch.  He reports that he forgot to take his Humalog at dinner last evening.  His glucose this morning was 428 after breakfast.  He reports his CGM fell off sometime on Sunday he believes.    Past Medical History:  Diagnosis Date  . Acute diastolic congestive heart failure ([place])   . Aortic valve stenosis s/p AVR 2018  . Atrial fibrillation ([place]) - post-op CABG    04/2016 CHA2DS2VAS score = 5  . Atypical nevi   . Coronary artery disease s/p 2 vessel CABG   . Depression    "years ago"  . Diabetes mellitus   . GERD (gastroesophageal reflux disease)   . History of kidney stones   . Hyperlipidemia    hx of transaminitis secondary to statin and he has decided not to use statins secondary to potential side effects.  . Hypertension   . Nephrolithiasis   . Osteoarthritis, knee   . Sleep apnea     Central apnea. Not using cpap  . Stroke [facility]) 2007  . Transaminitis     Statin-induced  . 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 2-D echo was normal no embolic source found.   Review of Systems:  Review of Systems  Constitutional: Negative.   Respiratory: Negative.   Cardiovascular: Negative.   Neurological: Negative.   Endo/Heme/Allergies: Negative for polydipsia.   Physical  Exam:  Vitals:   02/21/17 0931  BP: 123/73  Pulse: 64  Temp: 97.7 F (36.5 C)  SpO2: 100%  Weight: 214 lb 9.6 oz (97.3 kg)  Height: 5\' 8"  (1.727 m)   Physical Exam  Constitutional: He is oriented to person, place, and time and well-developed, well-nourished, and in no distress.  HENT:  Head: Normocephalic and atraumatic.  Pulmonary/Chest: Effort normal.  Neurological: He is alert and oriented to person, place, and time.  Psychiatric: Mood and affect normal.    Assessment & Plan:   See Encounters Tab for problem based charting.  Patient discussed with Dr. Evette Doffing.  Diabetes mellitus with neurological manifestation [facility]) Assessment: [name] wore the CGM for 3.5 days. The average reading was 386, % time in target was 0, % time below target was 0, and % time above target was. 100. Intervention will be to increase his total daily dose of insulin by about 20%. The patient will be scheduled to see physician in [facility] for a final appointment.   Plan: - Increase Lantus to 33 units daily - Increase sliding scale humalog from 6-14 units to 10-16 units based on glucose reading - emphasized with patient the importance of consistent insulin administration and adherence -RTC 1 week

## 2017-02-28 ENCOUNTER — Ambulatory Visit (INDEPENDENT_AMBULATORY_CARE_PROVIDER_SITE_OTHER): Payer: Medicare Other | Admitting: Internal Medicine

## 2017-02-28 ENCOUNTER — Other Ambulatory Visit: Payer: Self-pay

## 2017-02-28 ENCOUNTER — Encounter: Payer: Self-pay | Admitting: Internal Medicine

## 2017-02-28 ENCOUNTER — Ambulatory Visit (INDEPENDENT_AMBULATORY_CARE_PROVIDER_SITE_OTHER): Payer: Medicare Other | Admitting: Dietician

## 2017-02-28 DIAGNOSIS — Z794 Long term (current) use of insulin: Secondary | ICD-10-CM

## 2017-02-28 DIAGNOSIS — Z7982 Long term (current) use of aspirin: Secondary | ICD-10-CM | POA: Diagnosis not present

## 2017-02-28 DIAGNOSIS — E1149 Type 2 diabetes mellitus with other diabetic neurological complication: Secondary | ICD-10-CM

## 2017-02-28 DIAGNOSIS — E119 Type 2 diabetes mellitus without complications: Secondary | ICD-10-CM | POA: Diagnosis not present

## 2017-02-28 DIAGNOSIS — Z713 Dietary counseling and surveillance: Secondary | ICD-10-CM

## 2017-02-28 DIAGNOSIS — F329 Major depressive disorder, single episode, unspecified: Secondary | ICD-10-CM

## 2017-02-28 DIAGNOSIS — E1142 Type 2 diabetes mellitus with diabetic polyneuropathy: Secondary | ICD-10-CM

## 2017-02-28 DIAGNOSIS — Z6832 Body mass index (BMI) 32.0-32.9, adult: Secondary | ICD-10-CM | POA: Diagnosis not present

## 2017-02-28 MED ORDER — INSULIN GLARGINE 100 UNIT/ML SOLOSTAR PEN: 33.0000 [IU] | PEN_INJECTOR | Freq: Every day | SUBCUTANEOUS | 11 refills | Status: DC

## 2017-02-28 MED ORDER — PEN NEEDLES 31G X 5 MM MISC: 1.0000 | Freq: Three times a day (TID) | 12 refills | Status: DC

## 2017-02-28 MED ORDER — GLUCOSE BLOOD VI STRP: ORAL_STRIP | 12 refills | Status: DC

## 2017-02-28 MED ORDER — BAYER MICROLET LANCETS MISC: 12 refills | Status: DC

## 2017-02-28 MED ORDER — INSULIN LISPRO 100 UNIT/ML (KWIKPEN): PEN_INJECTOR | SUBCUTANEOUS | 0 refills | Status: DC

## 2017-02-28 NOTE — Progress Notes (Signed)
CC: DM follow up  HPI:  Mark Cabrera is a 66 y.o. man with PMHx as below here today for follow up of his diabetes as well as continuous glucose monitoring follow up.  Please see A&P for details of today's visit.  Past Medical History:  Diagnosis Date  . Acute diastolic congestive heart failure ([place])   . Aortic valve stenosis s/p AVR 2018  . Atrial fibrillation ([place]) - post-op CABG    04/2016 CHA2DS2VAS score = 5  . Atypical nevi   . Coronary artery disease s/p 2 vessel CABG   . Depression    "years ago"  . Diabetes mellitus   . GERD (gastroesophageal reflux disease)   . History of kidney stones   . Hyperlipidemia    hx of transaminitis secondary to statin and he has decided not to use statins secondary to potential side effects.  . Hypertension   . Nephrolithiasis   . Osteoarthritis, knee   . Sleep apnea     Central apnea. Not using cpap  . Stroke [facility]) 2007  . Transaminitis     Statin-induced  . 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 2-D echo was normal no embolic source found.   Review of Systems:   Review of Systems  Constitutional: Negative for chills and fever.  Eyes: Negative for blurred vision.  Genitourinary: Negative for frequency.  Endo/Heme/Allergies: Negative for polydipsia.      Physical Exam:  Vitals:   02/28/17 0852  BP: 118/66  Pulse: 70  Temp: 97.7 F (36.5 C)  TempSrc: Oral  SpO2: 100%  Weight: 214 lb 3.2 oz (97.2 kg)  Height: 5\' 8"  (1.727 m)   Physical Exam  Constitutional: He is oriented to person, place, and time and well-developed, well-nourished, and in no distress.  HENT:  Head: Normocephalic and atraumatic.  Pulmonary/Chest: Effort normal.    Neurological: He is alert and oriented to person, place, and time.  Skin: Skin is warm and dry.  Psychiatric: Mood and affect normal.     Assessment & Plan:   See Encounters Tab for problem based charting.  Patient discussed with Dr. Evette Doffing.  Diabetes mellitus with neurological manifestation ([place]) Mark Cabrera wore the CGM for 8 days. The average reading was 349, % time in target was 0, % time below target was 0, and % time above target was. 100. Intervention will be to continue with current dose of insulin. The patient will be scheduled to see provider in [facility] on 12/27 for a final appointment.   Assessment: Mark Cabrera has not increased his insulin dosing as planned after last visit.  He continued to take Lantus 28units daily and Humalog 10units BID along with his metformin.  As a result, we will go ahead and make the changes again today that were intended for last week and reassess in about 1 week.  Plan: - Increase insulin total daily dose by approximately 20% - Lantus increased to 33 units daily - Humalog sliding scale 10-16 units based on glucose reading - Instructions provided to patient. - Patient voiced no issues related to operating pen needles or concern with running out of medication early - DM educator to reach out by phone later this week to check in  on progress.

## 2017-02-28 NOTE — Progress Notes (Signed)
Internal Medicine Clinic Attending  Case discussed with Dr. Wallace at the time of the visit.  We reviewed the resident's history and exam and pertinent patient test results.  I agree with the assessment, diagnosis, and plan of care documented in the resident's note.  

## 2017-02-28 NOTE — Assessment & Plan Note (Signed)
Mark Cabrera wore the CGM for 8 days. The average reading was 349, % time in target was 0, % time below target was 0, and % time above target was. 100. Intervention will be to continue with current dose of insulin. The patient will be scheduled to see provider in [facility] on 12/27 for a final appointment.   Assessment: Mark Cabrera has not increased his insulin dosing as planned after last visit.  He continued to take Lantus 28units daily and Humalog 10units BID along with his metformin.  As a result, we will go ahead and make the changes again today that were intended for last week and reassess in about 1 week.  Plan: - Increase insulin total daily dose by approximately 20% - Lantus increased to 33 units daily - Humalog sliding scale 10-16 units based on glucose reading - Instructions provided to patient. - Patient voiced no issues related to operating pen needles or concern with running out of medication early - DM educator to reach out by phone later this week to check in on progress.

## 2017-02-28 NOTE — Patient Instructions (Addendum)
When: December 27 For: continuous glucose monitoring What to bring: bring your current medications  We are increasing your insulin about 20 percent total.  Please increase your Lantus to 33 units daily  For your Humalog, increase your sliding scale to 10-16 units based on glucose reading. Glucose 200-300 = 10 units 300-400 = 12 units 400-500 = 14 units > 500 = 16 units  Please be consistent with checking your blood sugars

## 2017-02-28 NOTE — Progress Notes (Signed)
  Medical Nutrition Therapy:  Appt start time: 1000 am end time:  1030 am Visit # 3  Assessment:  Primary concerns today: weight loss and blood sugar control.  Mark Cabrera is referred for help with diabetes control and weight management lifestyle changes.Marland Kitchen He is trying to eat lower animal fat and decrease his sugar intake. He bought almond milk, no butter in the last month. He eats about 2 meals/day.  He is asking appropriate questions about his diabetes and diabetes medicine indicating a willingness to learn and is interested in learning more about a healthy diet and his diabetes.    ANTHROPOMETRICS: weight-214.2#, BMI-32.57 SLEEP:had a cpap but got rid of it- lack of adequate sleep could be significant problem in diabetes control  MEDICATIONS: lantus 28 units, Humalog 10-12 units twice daily, metformin 1000 mg twice day.forgets metformin or Humalog about 1x/week, current insulin regimen is providing ~ 0.5units/kg BLOOD SUGAR:,Freestyle Libre Pro CGM report shows average bl;ood sugars in the 300s.,  a1c 12.1% DIETARY INTAKE: 24-hr recall:  First meal ( 830 AM): sausage, eggs, fruit and cottage cheese,coffee, likes honey to sweeten foods Second meal- chili with deer meat  Usual physical activity: adls, chops wood and light walking  Estimated daily energy needs for weight loss 1800-2000 calories  Progress Towards Goal(s):  Some progress.   Nutritional Diagnosis:  [place]-2.2 Altered nutrition-related laboratory As related to elevated blood sugars from excess carb intake, insufficient medication and non adherence  As evidenced by high A1C and blood sugar today.    Intervention:  Nutrition education and counseling about higher fiber, lower sat fat meal planning for his diabetes Coordination of care: none Teaching Method Utilized: Visual,auditory, Hands on  Handouts given during visit include: CGM folder Barriers to learning/adherence to lifestyle change: depression Demonstrated degree of  understanding via:  Teach Back   Monitoring/Evaluation:  Dietary intake, exercise, and body weight in 1 week(s). ,Plyler, [name], [place] 02/28/2017 10:30 AM.

## 2017-03-02 ENCOUNTER — Telehealth: Payer: Self-pay | Admitting: Dietician

## 2017-03-02 NOTE — Telephone Encounter (Signed)
Called Mark Cabrera to follow up on blood sugars:  He says it was in the "200s this am, blood sugar have come down". He is feeling better He confirms his insulin doses are :  10 units twice a day Humalog 33 units Lantus daily  Follow up: He has an appointment Thursday, 03/08/17 . He was educated about how to reach the after hours resident on call.

## 2017-03-05 ENCOUNTER — Telehealth: Payer: Self-pay | Admitting: Internal Medicine

## 2017-03-05 DIAGNOSIS — E1142 Type 2 diabetes mellitus with diabetic polyneuropathy: Secondary | ICD-10-CM

## 2017-03-05 MED ORDER — INSULIN GLARGINE 100 UNIT/ML SOLOSTAR PEN: 33.0000 [IU] | PEN_INJECTOR | Freq: Every day | SUBCUTANEOUS | 7 refills | Status: DC

## 2017-03-05 MED ORDER — INSULIN LISPRO 100 UNIT/ML (KWIKPEN): PEN_INJECTOR | SUBCUTANEOUS | 3 refills | Status: DC

## 2017-03-05 NOTE — Telephone Encounter (Signed)
   Reason for call:   I received a call from [name] at 9:45 AM indicating need for insulin refills.   Pertinent Data:   Patient states that he has enough Lantus for one more dose, and enough Humalog for about 1 week. No other issues.   Assessment / Plan / Recommendations:   Refill sent to patient's [facility] for Lantus 33u daily (14ml +refills), and Humalog (used as sliding scale with meals; 87ml +refills).  Patient had no other concerns, appreciated assistance.  Confirmed his appt on 12/27 to f/u on his DM control.   Alphonzo Grieve, MD   03/05/2017, 9:41 AM

## 2017-03-08 ENCOUNTER — Encounter: Payer: Self-pay | Admitting: Internal Medicine

## 2017-03-08 ENCOUNTER — Ambulatory Visit: Payer: Medicare Other

## 2017-03-08 ENCOUNTER — Encounter: Payer: Medicare Other | Admitting: Dietician

## 2017-03-09 ENCOUNTER — Telehealth: Payer: Self-pay | Admitting: Dietician

## 2017-03-09 NOTE — Telephone Encounter (Signed)
Could not get a ride to his appointment. Took CGM off and will drop it by next week. He is feeling fine and his fasting blood sugar today was 180 the lowest it has been in a while. He said we can look at the information and call him with an appointment if needed.

## 2017-03-14 NOTE — Telephone Encounter (Addendum)
Mark Cabrera dropped off the CGM sensor for final download.  The report shows 14 days of readings and 100% above target. Some improvement in blood sugars but the lowest values are about 180 with 98-100% of all values continuing to be greater than 180 mg/dl. CGM Report put in your box.  Suggest increase in insulin doses to assist with glycemic control. Happy to assist with increasing insulin  vs rescheduling his missed appointment. Samuella Rasool, [name], RD, Diabetes Educator 03/14/2017 2:02 PM.

## 2017-03-20 NOTE — Telephone Encounter (Signed)
Mr Mcdaid called asking for follow up on the CGM information he dropped off.   This am his blood sugar was >300 fasting, I confirmed that he is taking 10-12 units a meal (20-24 units total/day) humalog, metformin as directed and  33 units of lantus  Daily,  TDD + 57 units/day, suggest increase of 10-20% (6-12 units). Suggest increasing mealtime insulin to 14 units plus correction if needed twice a day based on lowest CGM readings ~ 180. Dr. Heber Estelline do you agree?

## 2017-03-20 NOTE — Telephone Encounter (Signed)
I agree

## 2017-03-20 NOTE — Telephone Encounter (Signed)
Called and explained new insulin orders to patient.( He saw his dentist who recommended he lower his blood sugars and this has motivated him more) . He agreed to keep track of his blood sugars and we'd talk again on Friday or Monday.

## 2017-03-23 ENCOUNTER — Telehealth: Payer: Self-pay | Admitting: Dietician

## 2017-03-23 NOTE — Telephone Encounter (Signed)
called patient to follow up on his blood sugars and did not have meter available to report them; he Has not checked his blood sugars today yet, but says he has been taking the 14 units of Humalog without problems. He reports all blood sugars since increasing his Humalog have been <200 and he thinks he is feeling better and more energy. Confirmed appointments for 03/28/16

## 2017-03-28 ENCOUNTER — Other Ambulatory Visit: Payer: Self-pay

## 2017-03-28 ENCOUNTER — Ambulatory Visit (INDEPENDENT_AMBULATORY_CARE_PROVIDER_SITE_OTHER): Payer: Medicare Other | Admitting: Dietician

## 2017-03-28 ENCOUNTER — Ambulatory Visit (INDEPENDENT_AMBULATORY_CARE_PROVIDER_SITE_OTHER): Payer: Medicare Other | Admitting: Internal Medicine

## 2017-03-28 DIAGNOSIS — Z794 Long term (current) use of insulin: Secondary | ICD-10-CM | POA: Diagnosis not present

## 2017-03-28 DIAGNOSIS — Z713 Dietary counseling and surveillance: Secondary | ICD-10-CM

## 2017-03-28 DIAGNOSIS — E1149 Type 2 diabetes mellitus with other diabetic neurological complication: Secondary | ICD-10-CM

## 2017-03-28 DIAGNOSIS — Z6832 Body mass index (BMI) 32.0-32.9, adult: Secondary | ICD-10-CM

## 2017-03-28 DIAGNOSIS — E1142 Type 2 diabetes mellitus with diabetic polyneuropathy: Secondary | ICD-10-CM

## 2017-03-28 DIAGNOSIS — Z7982 Long term (current) use of aspirin: Secondary | ICD-10-CM

## 2017-03-28 LAB — GLUCOSE, CAPILLARY: GLUCOSE-CAPILLARY: 194 mg/dL — AB (ref 65–99)

## 2017-03-28 LAB — POCT GLYCOSYLATED HEMOGLOBIN (HGB A1C): HEMOGLOBIN A1C: 10.5

## 2017-03-28 MED ORDER — EMPAGLIFLOZIN-METFORMIN HCL 5-1000 MG PO TABS: 1.0000 | ORAL_TABLET | Freq: Two times a day (BID) | ORAL | 4 refills | Status: DC

## 2017-03-28 MED ORDER — EMPAGLIFLOZIN-METFORMIN HCL 5-1000 MG PO TABS: 1.0000 | ORAL_TABLET | Freq: Two times a day (BID) | ORAL | 11 refills | Status: DC

## 2017-03-28 MED ORDER — INSULIN GLARGINE 100 UNIT/ML SOLOSTAR PEN: 40.0000 [IU] | PEN_INJECTOR | Freq: Every day | SUBCUTANEOUS | 7 refills | Status: DC

## 2017-03-28 NOTE — Progress Notes (Signed)
  Medical Nutrition Therapy:  Appt start time: 0815 am end time:  0830 am  Visit # 4  Assessment:  Primary concerns today: weight loss and blood sugar control.  Mark Cabrera diabetes control and diet intake are both improving along with maintenance of his weight. However, neither is at goal as yet.   His blood sugars are gradually improving and he is trying to make healthier food choices.  He is trying to eat lower animal fat and decrease his sugar intake.   I asked him to find out the mg in his vitamin c and will look into if his meter gets interference with this vitmain. He can wear the Continuous glucose monitoring sensor again in march 5 or later.   ANTHROPOMETRICS: weight-216.3#, BMI-32.57 SLEEP:had a cpap but got rid of it- lack of adequate sleep could be significant problem in diabetes control  MEDICATIONS: lantus 33 units, Humalog 14 units 2-3 times daily, metformin 1000 mg twice day.forgets metformin or Humalog about 1x/week, current insulin regimen is providing 42 units(56%) prandial, 33 units basal(44%) for Total of 75 units/day or ~ 0.765units/kg BLOOD SUGAR:meter download shows for past 30 days, but only 134% in target, range of 179-410,average 273, pattern of glucose values is fairly flat,   a1c decreased from  12.1% in September to 10.5% today DIETARY INTAKE: 24-hr recall:  First meal ( 830 AM): chocolate milk, orange this am,coffee Second meal- home-cooked cornbread with jalepenos, covered with greens and some lean duck bacon someone had given him  Usual physical activity: adls, chops wood and light walking  Estimated daily energy needs for weight loss 1800-2000 calories  Progress Towards Goal(s):  Some progress.   Nutritional Diagnosis:  [place]-2.2 Altered nutrition-related laboratory As related to elevated blood sugars from excess carb intake, insufficient medication and non adherence is improving  As evidenced by decrease in A1C and blood sugar today.    Intervention:   Nutrition education and counseling about higher nutrient density foods, glycemic targets(100-200 mg/dl most of the time)  Coordination of care: Discussed with Dr. Magdalene River today- suggest insulin sensitizing agents & increasing basal insulin 10-20% ( 4-8 units)  Teaching Method Utilized: Visual,auditory, Hands on  Handouts given during visit include: glucose meter download Barriers to learning/adherence to lifestyle change: depression Demonstrated degree of understanding via:  Teach Back   Monitoring/Evaluation:  Dietary intake, exercise, and body weight in 6 week(s). Mark Cabrera, RD 03/28/2017 9:16 AM.

## 2017-03-28 NOTE — Patient Instructions (Signed)
Mark Cabrera,  What we talked about today:   Adding 1-2 tablespoons of wheat germ to white flours and cornbread mix to increase nutrition Blood sugar Goals for most person with diabetes is for blood sugar to be 100-130 before meals so that when it goes up after eating it still stays less than 200 mg/dl,   The plan:   Buy wheat germ? Adjust medication per doctor. Call me with questions about your diet or diabetes  Suggest you make an appointment to follow up with me in 6-8 weeks .  Call anytime with questions or concerns  Debera Lat Diabetes Educator [phone]

## 2017-03-28 NOTE — Progress Notes (Signed)
   CC: Follow-up on type 2 diabetes  HPI:  [name] is a 67 y.o. male with history noted below that presents to the acute care clinic for follow-up on uncontrolled type 2 diabetes. Please see problem based charting for the status of patient's chronic medical conditions.  Past Medical History:  Diagnosis Date  . Acute diastolic congestive heart failure ([place])   . Aortic valve stenosis s/p AVR 2018  . Atrial fibrillation ([place]) - post-op CABG    04/2016 CHA2DS2VAS score = 5  . Atypical nevi   . Coronary artery disease s/p 2 vessel CABG   . Depression    "years ago"  . Diabetes mellitus   . GERD (gastroesophageal reflux disease)   . History of kidney stones   . Hyperlipidemia    hx of transaminitis secondary to statin and he has decided not to use statins secondary to potential side effects.  . Hypertension   . Nephrolithiasis   . Osteoarthritis, knee   . Sleep apnea     Central apnea. Not using cpap  . Stroke [facility]) 2007  . Transaminitis     Statin-induced  . 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 2-D echo was normal no embolic source found.    Review of Systems:  Review of Systems  Constitutional: Negative for malaise/fatigue.  Respiratory: Negative for shortness of breath.   Cardiovascular: Negative for chest pain.  Gastrointestinal: Negative for abdominal pain, nausea and vomiting.  Genitourinary: Negative for frequency.  Endo/Heme/Allergies: Negative for polydipsia.    Physical Exam:  Vitals:   03/28/17 0904  BP: (!) 160/81  Pulse: 67  SpO2: 100%   Physical Exam  Constitutional: He is well-developed, well-nourished, and in no distress.  Cardiovascular: Normal rate, regular  rhythm and normal heart sounds. Exam reveals no gallop and no friction rub.  No murmur heard. Pulmonary/Chest: Effort normal and breath sounds normal. No respiratory distress. He has no wheezes. He has no rales.  Musculoskeletal: He exhibits no edema.     Assessment & Plan:   See encounters tab for problem based medical decision making.    Patient discussed with Dr. Angelia Mould

## 2017-03-28 NOTE — Patient Instructions (Addendum)
Mark Cabrera,  Was a pleasure seeing you today. Please increase your Lantus to 40 units daily. Please start taking synjardy twice a day. Please call the clinic with any questions or concerns.

## 2017-03-29 ENCOUNTER — Ambulatory Visit: Payer: Medicare Other

## 2017-03-29 NOTE — Assessment & Plan Note (Signed)
Assessment:  Uncontrolled Type II diabetes Patient is currently taking Humalog 14 units with meals, Lantus 33 units at bedtime and metformin 1000 mg twice daily.  Today Hemoglobin A1c is 10.5 previously 12. Patient reports adherence to his medications.  Will increase lantus to 40 units daily and add empagliflozin to medication regimen.  Plan -Increase insulin from 33 units daily to 40 units -Continue Humalog at 14 units with meals -Add empagliflozin

## 2017-04-03 NOTE — Progress Notes (Signed)
Internal Medicine Clinic Attending  Case discussed with Dr. Yoseline Andersson at the time of the visit.  We reviewed the resident's history and exam and pertinent patient test results.  I agree with the assessment, diagnosis, and plan of care documented in the resident's note.  

## 2017-06-07 ENCOUNTER — Other Ambulatory Visit: Payer: Self-pay | Admitting: *Deleted

## 2017-06-07 NOTE — Telephone Encounter (Signed)
Next appt scheduled 4/18 with PCP. ?

## 2017-06-08 MED ORDER — GABAPENTIN 100 MG PO CAPS: 100.0000 mg | ORAL_CAPSULE | Freq: Two times a day (BID) | ORAL | 2 refills | Status: DC

## 2017-06-19 ENCOUNTER — Telehealth: Payer: Self-pay

## 2017-06-28 ENCOUNTER — Encounter: Payer: Self-pay | Admitting: Internal Medicine

## 2017-06-28 ENCOUNTER — Ambulatory Visit (INDEPENDENT_AMBULATORY_CARE_PROVIDER_SITE_OTHER): Payer: Medicare Other | Admitting: Internal Medicine

## 2017-06-28 VITALS — BP 123/77 | HR 83 | Temp 97.5°F | Ht 68.0 in | Wt 223.1 lb

## 2017-06-28 DIAGNOSIS — E785 Hyperlipidemia, unspecified: Secondary | ICD-10-CM | POA: Diagnosis not present

## 2017-06-28 DIAGNOSIS — R011 Cardiac murmur, unspecified: Secondary | ICD-10-CM | POA: Diagnosis not present

## 2017-06-28 DIAGNOSIS — Z7982 Long term (current) use of aspirin: Secondary | ICD-10-CM | POA: Diagnosis not present

## 2017-06-28 DIAGNOSIS — E1149 Type 2 diabetes mellitus with other diabetic neurological complication: Secondary | ICD-10-CM

## 2017-06-28 DIAGNOSIS — E1142 Type 2 diabetes mellitus with diabetic polyneuropathy: Secondary | ICD-10-CM | POA: Diagnosis not present

## 2017-06-28 DIAGNOSIS — Z794 Long term (current) use of insulin: Secondary | ICD-10-CM | POA: Diagnosis not present

## 2017-06-28 DIAGNOSIS — K7460 Unspecified cirrhosis of liver: Secondary | ICD-10-CM | POA: Diagnosis not present

## 2017-06-28 LAB — GLUCOSE, CAPILLARY: GLUCOSE-CAPILLARY: 278 mg/dL — AB (ref 65–99)

## 2017-06-28 LAB — POCT GLYCOSYLATED HEMOGLOBIN (HGB A1C): Hemoglobin A1C: 9.9

## 2017-06-28 LAB — PROTIME-INR
INR: 0.98
Prothrombin Time: 12.9 seconds (ref 11.4–15.2)

## 2017-06-28 MED ORDER — PEN NEEDLES 31G X 5 MM MISC: 1.0000 | Freq: Three times a day (TID) | 12 refills | Status: DC

## 2017-06-28 MED ORDER — INSULIN LISPRO 100 UNIT/ML (KWIKPEN): PEN_INJECTOR | SUBCUTANEOUS | 3 refills | Status: DC

## 2017-06-28 MED ORDER — BAYER MICROLET LANCETS MISC: 12 refills | Status: DC

## 2017-06-28 MED ORDER — INSULIN GLARGINE 100 UNIT/ML SOLOSTAR PEN: 40.0000 [IU] | PEN_INJECTOR | Freq: Every day | SUBCUTANEOUS | 11 refills | Status: DC

## 2017-06-28 NOTE — Progress Notes (Signed)
   CC: follow-up on type 2 diabetes  HPI:  [name] is a 67 y.o. male with history noted below that presents to the internal medicine clinic for follow-up on type 2 diabetes. Please see probablem based charting for the status of patient's chronic medical conditions.  Past Medical History:  Diagnosis Date  . Acute diastolic congestive heart failure ([place])   . Aortic valve stenosis s/p AVR 2018  . Atrial fibrillation ([place]) - post-op CABG    04/2016 CHA2DS2VAS score = 5  . Atypical nevi   . Coronary artery disease s/p 2 vessel CABG   . Depression    "years ago"  . Diabetes mellitus   . GERD (gastroesophageal reflux disease)   . History of kidney stones   . Hyperlipidemia    hx of transaminitis secondary to statin and he has decided not to use statins secondary to potential side effects.  . Hypertension   . Nephrolithiasis   . Osteoarthritis, knee   . Sleep apnea     Central apnea. Not using cpap  . Stroke [facility]) 2007  . Transaminitis     Statin-induced  . 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 2-D echo was normal no embolic source found.    Review of Systems:  Review of Systems  Respiratory: Negative for shortness of breath.   Cardiovascular: Negative for chest pain.  Gastrointestinal: Negative for abdominal pain.     Physical Exam:  Vitals:   06/28/17 1321  BP: 123/77  Pulse: 83  Temp: (!) 97.5 F (36.4 C)  TempSrc: Oral  SpO2: 99%  Weight: 223 lb 1.6 oz (101.2 kg)  Height: 5\' 8"  (1.727 m)   Physical Exam  Cardiovascular: Normal rate and regular rhythm.  Murmur heard. Pulmonary/Chest: Effort normal and breath sounds normal. No respiratory distress. He has no wheezes. He has no  rales.  Musculoskeletal: He exhibits no edema.  Skin: Skin is warm and dry.     Assessment & Plan:   See encounters tab for problem based medical decision making.   Patient discussed with Dr. Evette Doffing

## 2017-06-28 NOTE — Patient Instructions (Signed)
Mr. Lank,  It was a pleasure seeing you today.  Please follow up in 3 months.  Please try and take your meal time insulin at your biggest meal of the day.

## 2017-06-29 ENCOUNTER — Encounter: Payer: Self-pay | Admitting: Internal Medicine

## 2017-06-29 DIAGNOSIS — K7460 Unspecified cirrhosis of liver: Secondary | ICD-10-CM

## 2017-06-29 HISTORY — DX: Unspecified cirrhosis of liver: K74.60

## 2017-06-29 LAB — CMP14 + ANION GAP
A/G RATIO: 1.7 (ref 1.2–2.2)
ALBUMIN: 4.7 g/dL (ref 3.6–4.8)
ALK PHOS: 81 IU/L (ref 39–117)
ALT: 34 IU/L (ref 0–44)
AST: 34 IU/L (ref 0–40)
Anion Gap: 21 mmol/L — ABNORMAL HIGH (ref 10.0–18.0)
BILIRUBIN TOTAL: 0.2 mg/dL (ref 0.0–1.2)
BUN / CREAT RATIO: 13 (ref 10–24)
BUN: 17 mg/dL (ref 8–27)
CHLORIDE: 96 mmol/L (ref 96–106)
CO2: 23 mmol/L (ref 20–29)
CREATININE: 1.34 mg/dL — AB (ref 0.76–1.27)
Calcium: 10.1 mg/dL (ref 8.6–10.2)
GFR calc Af Amer: 62 mL/min/{1.73_m2} (ref >59)
GFR calc non Af Amer: 54 mL/min/{1.73_m2} — ABNORMAL LOW (ref >59)
GLOBULIN, TOTAL: 2.7 g/dL (ref 1.5–4.5)
Glucose: 255 mg/dL — ABNORMAL HIGH (ref 65–99)
POTASSIUM: 4.2 mmol/L (ref 3.5–5.2)
SODIUM: 140 mmol/L (ref 134–144)
Total Protein: 7.4 g/dL (ref 6.0–8.5)

## 2017-06-29 LAB — HEPATITIS B SURFACE ANTIGEN: Hepatitis B Surface Ag: NEGATIVE

## 2017-06-29 LAB — FERRITIN: FERRITIN: 89 ng/mL (ref 30–400)

## 2017-06-29 LAB — HEPATITIS B SURFACE ANTIBODY,QUALITATIVE: Hep B Surface Ab, Qual: NONREACTIVE

## 2017-06-29 LAB — IRON AND TIBC
IRON SATURATION: 21 % (ref 15–55)
Iron: 69 ug/dL (ref 38–169)
TIBC: 335 ug/dL (ref 250–450)
UIBC: 266 ug/dL (ref 111–343)

## 2017-06-29 LAB — HEPATITIS C ANTIBODY: Hep C Virus Ab: <0.1 s/co ratio (ref 0.0–0.9)

## 2017-06-29 LAB — HEPATITIS B CORE ANTIBODY, TOTAL: HEP B C TOTAL AB: NEGATIVE

## 2017-06-29 NOTE — Assessment & Plan Note (Signed)
Assessment:  Uncontrolled Type II diabetes Patient is currently taking Lantus 40 units in the morning and metformin 1000 mg twice daily and empagliflozin.  However since he was recently prescribed sunjardy he does not know if he is taking the combo pill or just metformin.  I asked he double check at home and to start taking synjardy if not already taking.  Patient states that he often forgets to take his NovoLog with meals. He reports 2 out of the 3 meals he does not use insulin due to forgetting.  He did not bring his meter. He states he checks his blood sugars  2 times a month and is usually between 200-400.  Today Hemoglobin A1c is 9.9 previously 10.5. I recommended checking sugars at least once a day and to use NovoLog with his biggest meal.  Plan - refill novolog, lantus, test strips and pen needles - continue empagliflozin-metformin

## 2017-06-30 NOTE — Assessment & Plan Note (Signed)
Assessment:  CT imaging consistent with hepatic cirrhosis On 11/2016 patient had a CT chest assessing chest wall pain that noted nodular hepatic margins consistent with hepatic cirrhosis. Patient states he does not have a history of alcohol abuse.  This could be NAFDL with history of HLD and type II diabetes.  Will work up further.  Plan - CMP, PT/INR - Hep C antibody - HBsAg, HBsAb and HBeAb - Ferritin/Iron IBC

## 2017-07-02 NOTE — Progress Notes (Signed)
Internal Medicine Clinic Attending  Case discussed with Dr. Hoffman at the time of the visit.  We reviewed the resident's history and exam and pertinent patient test results.  I agree with the assessment, diagnosis, and plan of care documented in the resident's note.  

## 2017-08-01 ENCOUNTER — Telehealth: Payer: Self-pay | Admitting: Cardiovascular Disease

## 2017-08-01 ENCOUNTER — Other Ambulatory Visit: Payer: Self-pay

## 2017-08-01 ENCOUNTER — Telehealth: Payer: Self-pay

## 2017-08-01 DIAGNOSIS — L7682 Other postprocedural complications of skin and subcutaneous tissue: Secondary | ICD-10-CM

## 2017-08-01 NOTE — Telephone Encounter (Signed)
I spoke with pt. He reports chest incisional area is sore and tender.  Feels "spongy" under incision line.  He is concerned about possible infection.  No drainage noted. I asked him to contact Dr. Leonarda Salon office to discuss these concerns.

## 2017-08-01 NOTE — Telephone Encounter (Signed)
Patient contacted about appointment, patient is to be seen Tuesday, May 28th at 0915 with a chest xray before.  VM left on patient phone for a call back regarding information.

## 2017-08-01 NOTE — Telephone Encounter (Signed)
-----   Message from [name], MD sent at 08/01/2017  2:45 PM EDT ----- Get him in to see me next week. Needs a CXR  SCh ----- Message ----- From: Donnella Sham, RN Sent: 08/01/2017   2:36 PM To: [name], MD  Calling back about sternal incision from sx 03/2016 feels "sore" and "spongy".  He thinks that this is due to his "body rejecting the sternal wires and keeping the infection".  He states that he feels fluid when he touches the sore area.    I asked if he had an fevers, chills, drainage/ pus, redness/ red streaks all of which he denied.  Please advise, thanks,  Caryl Pina

## 2017-08-01 NOTE — Telephone Encounter (Signed)
Pt calling   Pt stated he think when he had his surgery  40yr ago that the stainless steel wires that was put in him 87yr ago has gotten an infection. Pt stated his chest is sore and tender and when he push on it, it feels spongi. Pt which to speak to nurse concerning this matter.

## 2017-08-07 ENCOUNTER — Ambulatory Visit
Admission: RE | Admit: 2017-08-07 | Discharge: 2017-08-07 | Disposition: A | Discharge status: Home / Self Care | Payer: Medicare Other | Source: Ambulatory Visit | Attending: Thoracic Surgery (Cardiothoracic Vascular Surgery) | Admitting: Thoracic Surgery (Cardiothoracic Vascular Surgery)

## 2017-08-07 ENCOUNTER — Other Ambulatory Visit: Payer: Self-pay

## 2017-08-07 ENCOUNTER — Ambulatory Visit (INDEPENDENT_AMBULATORY_CARE_PROVIDER_SITE_OTHER): Payer: Medicare Other | Admitting: Thoracic Surgery (Cardiothoracic Vascular Surgery)

## 2017-08-07 ENCOUNTER — Other Ambulatory Visit: Payer: Self-pay | Admitting: *Deleted

## 2017-08-07 ENCOUNTER — Encounter: Payer: Self-pay | Admitting: Thoracic Surgery (Cardiothoracic Vascular Surgery)

## 2017-08-07 VITALS — BP 107/75 | HR 85 | Resp 16 | Ht 68.0 in | Wt 217.0 lb

## 2017-08-07 DIAGNOSIS — Z951 Presence of aortocoronary bypass graft: Secondary | ICD-10-CM | POA: Diagnosis not present

## 2017-08-07 DIAGNOSIS — Z952 Presence of prosthetic heart valve: Secondary | ICD-10-CM

## 2017-08-07 DIAGNOSIS — R0602 Shortness of breath: Secondary | ICD-10-CM | POA: Diagnosis not present

## 2017-08-07 DIAGNOSIS — R0789 Other chest pain: Secondary | ICD-10-CM

## 2017-08-07 DIAGNOSIS — L7682 Other postprocedural complications of skin and subcutaneous tissue: Secondary | ICD-10-CM

## 2017-08-07 NOTE — H&P (View-Only) (Signed)
[address]       ,[place] [address]             [phone]     HPI: Mr. Schnitker returns for follow-up regarding his sternal incision.  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  I did an aortic valve replacement coronary bypass grafting x2 in January 2018.  His postoperative course was complicated by atrial fibrillation and post pericardiotomy syndrome.  I last saw him in the office in September 2018.  He was concerned about his sternal incision.  He had developed a blister near his sternotomy scar.  He popped the blister with a needle and then a couple days later he noted some purulent drainage.  That cleared up with local wound care and antibiotics.  He has been having some irritation from 2 spots on his sternal wound one in the midportion and one right at the top.  He says it sometimes feels "spongy" around this area.  He has not had any redness.  He denies fevers or chills.  Past Medical History:  Diagnosis Date  . Acute diastolic congestive heart failure ([place])   . Aortic valve stenosis s/p AVR 2018  . Atrial fibrillation ([place]) - post-op CABG    04/2016 CHA2DS2VAS score = 5  . Atypical nevi   . Coronary artery disease s/p 2 vessel CABG   . Depression    "years ago"  . Diabetes mellitus   . GERD (gastroesophageal reflux disease)   . Hepatic cirrhosis ([place]) 06/29/2017  . History of kidney stones   . Hyperlipidemia    hx of transaminitis secondary to statin and he has decided not to use statins secondary to potential side effects.  . Hypertension   . Nephrolithiasis   . Osteoarthritis, knee   . Sleep apnea     Central apnea. Not using cpap  . Stroke [facility]) 2007  . Transaminitis     Statin-induced  . 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 2-D echo was normal no embolic source found.   Past Surgical History:  Procedure Laterality Date  . AORTIC VALVE REPLACEMENT N/A 03/30/2016   Procedure: AORTIC VALVE REPLACEMENT (AVR);  Surgeon: [name], MD;  Location: [facility];  Service: Open Heart Surgery;  Laterality: N/A;  . CARDIAC CATHETERIZATION N/A 03/15/2016   Procedure: Right/Left Heart Cath and Coronary Angiography;  Surgeon: Burnell Blanks, MD;  Location: [facility] CV LAB;  Service: Cardiovascular;  Laterality: N/A;  . CLIPPING OF ATRIAL APPENDAGE N/A 03/30/2016   Procedure: CLIPPING OF LEFT ATRIAL APPENDAGE;  Surgeon: [name], MD;  Location: [facility];  Service: Open Heart Surgery;  Laterality: N/A;  . CORONARY ARTERY BYPASS GRAFT N/A 03/30/2016   Procedure: CORONARY ARTERY BYPASS GRAFTING (CABG) Times Two;  Surgeon: [name], MD;  Location: [facility];  Service: Open Heart Surgery;  Laterality: N/A;  . KNEE ARTHROSCOPY W/ PARTIAL MEDIAL MENISCECTOMY  05/12/2005   right, performed by Dr. French Ana for torn medial meniscus.  Marland Kitchen ROTATOR CUFF REPAIR Right 2016  . SUBXYPHOID PERICARDIAL WINDOW N/A  04/21/2016   Procedure: SUBXYPHOID PERICARDIAL WINDOW;  Surgeon: [name], MD;  Location: [facility];  Service: Thoracic;  Laterality: N/A;  . TEE WITHOUT CARDIOVERSION N/A 03/30/2016   Procedure: TRANSESOPHAGEAL ECHOCARDIOGRAM (TEE);  Surgeon: [name], MD;  Location: [facility];  Service: Open Heart Surgery;  Laterality: N/A;  . TEE WITHOUT CARDIOVERSION N/A 04/21/2016   Procedure: TRANSESOPHAGEAL ECHOCARDIOGRAM (TEE);  Surgeon: [name], MD;  [facility];  Service: Thoracic;  Laterality: N/A;    Current Outpatient  Medications  Medication Sig Dispense Refill  . acetaminophen (TYLENOL) 325 MG tablet Take 2 tablets (650 mg total) by mouth every 4 (four) hours as needed for headache or mild pain.    . Ascorbic Acid (VITAMIN C PO) Take 1 tablet by mouth daily.    Marland Kitchen aspirin EC 81 MG tablet Take 1 tablet (81 mg total) by mouth daily. 90 tablet 3  . BAYER MICROLET LANCETS lancets Check blood sugar 3 times a day as instructed 100 each 12  . carvedilol (COREG) 3.125 MG tablet Take 1 tablet (3.125 mg total) by mouth 2 (two) times daily with a meal. 60 tablet 11  . Empagliflozin-Metformin HCl (SYNJARDY) 07-998 MG TABS Take 1 tablet by mouth 2 (two) times daily. 180 tablet 4  . furosemide (LASIX) 40 MG tablet Take 1 tablet by mouth every morning and 2 tablet by mouth every night. 90 tablet 9  . gabapentin (NEURONTIN) 100 MG capsule Take 1 capsule (100 mg total) by mouth 2 (two) times daily. 180 capsule 2  . glucose blood (CONTOUR NEXT TEST) test strip Check blood sugar 3 times a day as instructed 100 each 12  . Insulin Glargine (LANTUS) 100 UNIT/ML Solostar Pen Inject 40 Units into the skin daily at 10 pm. 15 mL 11  . insulin lispro (HUMALOG KWIKPEN) 100 UNIT/ML KiwkPen Please take 14 units with meals 15 mL 3  . Insulin Pen Needle (PEN NEEDLES) 31G X 5 MM MISC 1 each by Does not apply route 3 (three) times daily. 100 each 12  . KLOR-CON M20 20 MEQ tablet TAKE 1 TABLET BY MOUTH ONCE DAILY 90 tablet 2  . nitroGLYCERIN (NITROSTAT) 0.4 MG SL tablet Place 1 tablet (0.4 mg total) under the tongue every 5 (five) minutes as needed for chest pain. 25 tablet 1  . Nystatin POWD Apply 1 application topically 2 (two) times daily. 1 Bottle 1  . sennosides-docusate sodium (SENOKOT-S) 8.6-50 MG tablet Take 1 tablet by mouth as needed for constipation.    . tamsulosin (FLOMAX) 0.4 MG CAPS capsule Take 1 capsule (0.4 mg total) by mouth daily after supper. 30 capsule 11   No current facility-administered medications for this visit.      Physical Exam BP 107/75 (BP Location: Right Arm, Patient Position: Sitting, Cuff Size: Large)   Pulse 85   Resp 16   Ht 5\' 8"  (1.727 m)   Wt 217 lb (98.4 kg)   SpO2 98% Comment: ON RA  BMI 32.70 kg/m  68 year old man in no acute distress Alert and oriented x3 with no focal deficits No cervical or subclavicular adenopathy Cardiac regular rate and rhythm with a 2/6 systolic murmur Sternal incision intact, sternum stable, palpable sternal wires at the superior aspect and midpoint of the incision Lungs clear with equal breath sounds bilaterally No peripheral edema  Diagnostic Tests: CHEST - 2 VIEW  COMPARISON:  CT chest of 12/06/2016, chest x-ray of 05/09/2016 and 05/03/2016  FINDINGS: No active infiltrate or effusion  is seen. Mediastinal and hilar contours are unremarkable. The heart is mildly enlarged and stable. Median sternotomy sutures are noted and no complication is seen. Changes of aortic valve replacement and CABG are present with device present for left atrial appendage exclusion. No bony abnormality is seen.  IMPRESSION: 1. No active cardiopulmonary disease. 2. Changes of prior AVR and CABG.   Electronically Signed   By: Ivar Drape M.D.   On: 08/07/2017 09:33 I personally reviewed the chest x-ray images and concur with the findings noted above.  Impression: Mr. Abalos is a 67 year old gentleman who had aortic valve replacement and coronary bypass grafting a little over a year ago.  He is having some irritation from his sternal wires into sites.  His sternum is stable and there is no evidence of a nonunion.  There is no evidence of infection currently.    I advised him that we could remove the sternal wires from those 2 sites under local anesthesia with some sedation in the operating room.  I described the procedure to him in detail.  I informed him of the indications, risks, benefits, and alternatives.  He understands risks include reaction to medication,  bleeding, and infection.  There also is the possibility of other unforeseeable complications.  We will plan to do this on an outpatient basis.  He accepts the risks and wishes to proceed.  Plan: Sternal wire removal with local anesthesia and intravenous sedation on Monday, 08/13/2017  [name], MD [facility] [phone]

## 2017-08-07 NOTE — Progress Notes (Signed)
[address]       [place] [address]             [phone]     HPI: Mr. Lenhoff returns for follow-up regarding his sternal incision.  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  I did an aortic valve replacement coronary bypass grafting x2 in January 2018.  His postoperative course was complicated by atrial fibrillation and post pericardiotomy syndrome.  I last saw him in the office in September 2018.  He was concerned about his sternal incision.  He had developed a blister near his sternotomy scar.  He popped the blister with a needle and then a couple days later he noted some purulent drainage.  That cleared up with local wound care and antibiotics.  He has been having some irritation from 2 spots on his sternal wound one in the midportion and one right at the top.  He says it sometimes feels "spongy" around this area.  He has not had any redness.  He denies fevers or chills.  Past Medical History:  Diagnosis Date  . Acute diastolic congestive heart failure ([place])   . Aortic valve stenosis s/p AVR 2018  . Atrial fibrillation ([place]) - post-op CABG    04/2016 CHA2DS2VAS score = 5  . Atypical nevi   . Coronary artery disease s/p 2 vessel CABG   . Depression    "years ago"  . Diabetes mellitus   . GERD (gastroesophageal reflux disease)   . Hepatic cirrhosis ([place]) 06/29/2017  . History of kidney stones   . Hyperlipidemia    hx of transaminitis secondary to statin and he has decided not to use statins secondary to potential side effects.  . Hypertension   . Nephrolithiasis   . Osteoarthritis, knee   . Sleep apnea     Central apnea. Not using cpap  . Stroke [facility]) 2007  . Transaminitis     Statin-induced  . 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 2-D echo was normal no embolic source found.   Past Surgical History:  Procedure Laterality Date  . AORTIC VALVE REPLACEMENT N/A 03/30/2016   Procedure: AORTIC VALVE REPLACEMENT (AVR);  Surgeon: [name], MD;  Location: [facility];  Service: Open Heart Surgery;  Laterality: N/A;  . CARDIAC CATHETERIZATION N/A 03/15/2016   Procedure: Right/Left Heart Cath and Coronary Angiography;  Surgeon: Burnell Blanks, MD;  Location: [facility] CV LAB;  Service: Cardiovascular;  Laterality: N/A;  . CLIPPING OF ATRIAL APPENDAGE N/A 03/30/2016   Procedure: CLIPPING OF LEFT ATRIAL APPENDAGE;  Surgeon: [name], MD;  Location: [facility];  Service: Open Heart Surgery;  Laterality: N/A;  . CORONARY ARTERY BYPASS GRAFT N/A 03/30/2016   Procedure: CORONARY ARTERY BYPASS GRAFTING (CABG) Times Two;  Surgeon: [name], MD;  Location: [facility];  Service: Open Heart Surgery;  Laterality: N/A;  . KNEE ARTHROSCOPY W/ PARTIAL MEDIAL MENISCECTOMY  05/12/2005   right, performed by Dr. French Ana for torn medial meniscus.  Marland Kitchen ROTATOR CUFF REPAIR Right 2016  . SUBXYPHOID PERICARDIAL WINDOW N/A  04/21/2016   Procedure: SUBXYPHOID PERICARDIAL WINDOW;  Surgeon: [name], MD;  Location: [facility];  Service: Thoracic;  Laterality: N/A;  . TEE WITHOUT CARDIOVERSION N/A 03/30/2016   Procedure: TRANSESOPHAGEAL ECHOCARDIOGRAM (TEE);  Surgeon: [name], MD;  Location: [facility];  Service: Open Heart Surgery;  Laterality: N/A;  . TEE WITHOUT CARDIOVERSION N/A 04/21/2016   Procedure: TRANSESOPHAGEAL ECHOCARDIOGRAM (TEE);  Surgeon: [name], MD;  [facility];  Service: Thoracic;  Laterality: N/A;    Current Outpatient  Medications  Medication Sig Dispense Refill  . acetaminophen (TYLENOL) 325 MG tablet Take 2 tablets (650 mg total) by mouth every 4 (four) hours as needed for headache or mild pain.    . Ascorbic Acid (VITAMIN C PO) Take 1 tablet by mouth daily.    Marland Kitchen aspirin EC 81 MG tablet Take 1 tablet (81 mg total) by mouth daily. 90 tablet 3  . BAYER MICROLET LANCETS lancets Check blood sugar 3 times a day as instructed 100 each 12  . carvedilol (COREG) 3.125 MG tablet Take 1 tablet (3.125 mg total) by mouth 2 (two) times daily with a meal. 60 tablet 11  . Empagliflozin-Metformin HCl (SYNJARDY) 07-998 MG TABS Take 1 tablet by mouth 2 (two) times daily. 180 tablet 4  . furosemide (LASIX) 40 MG tablet Take 1 tablet by mouth every morning and 2 tablet by mouth every night. 90 tablet 9  . gabapentin (NEURONTIN) 100 MG capsule Take 1 capsule (100 mg total) by mouth 2 (two) times daily. 180 capsule 2  . glucose blood (CONTOUR NEXT TEST) test strip Check blood sugar 3 times a day as instructed 100 each 12  . Insulin Glargine (LANTUS) 100 UNIT/ML Solostar Pen Inject 40 Units into the skin daily at 10 pm. 15 mL 11  . insulin lispro (HUMALOG KWIKPEN) 100 UNIT/ML KiwkPen Please take 14 units with meals 15 mL 3  . Insulin Pen Needle (PEN NEEDLES) 31G X 5 MM MISC 1 each by Does not apply route 3 (three) times daily. 100 each 12  . KLOR-CON M20 20 MEQ tablet TAKE 1 TABLET BY MOUTH ONCE DAILY 90 tablet 2  . nitroGLYCERIN (NITROSTAT) 0.4 MG SL tablet Place 1 tablet (0.4 mg total) under the tongue every 5 (five) minutes as needed for chest pain. 25 tablet 1  . Nystatin POWD Apply 1 application topically 2 (two) times daily. 1 Bottle 1  . sennosides-docusate sodium (SENOKOT-S) 8.6-50 MG tablet Take 1 tablet by mouth as needed for constipation.    . tamsulosin (FLOMAX) 0.4 MG CAPS capsule Take 1 capsule (0.4 mg total) by mouth daily after supper. 30 capsule 11   No current facility-administered medications for this visit.      Physical Exam BP 107/75 (BP Location: Right Arm, Patient Position: Sitting, Cuff Size: Large)   Pulse 85   Resp 16   Ht 5\' 8"  (1.727 m)   Wt 217 lb (98.4 kg)   SpO2 98% Comment: ON RA  BMI 32.22 kg/m  67 year old man in no acute distress Alert and oriented x3 with no focal deficits No cervical or subclavicular adenopathy Cardiac regular rate and rhythm with a 2/6 systolic murmur Sternal incision intact, sternum stable, palpable sternal wires at the superior aspect and midpoint of the incision Lungs clear with equal breath sounds bilaterally No peripheral edema  Diagnostic Tests: CHEST - 2 VIEW  COMPARISON:  CT chest of 12/06/2016, chest x-ray of 05/09/2016 and 05/03/2016  FINDINGS: No active infiltrate or effusion  is seen. Mediastinal and hilar contours are unremarkable. The heart is mildly enlarged and stable. Median sternotomy sutures are noted and no complication is seen. Changes of aortic valve replacement and CABG are present with device present for left atrial appendage exclusion. No bony abnormality is seen.  IMPRESSION: 1. No active cardiopulmonary disease. 2. Changes of prior AVR and CABG.   Electronically Signed   By: Ivar Drape M.D.   On: 08/07/2017 09:33 I personally reviewed the chest x-ray images and concur with the findings noted above.  Impression: [name] is a 67 year old gentleman who had aortic valve replacement and coronary bypass grafting a little over a year ago.  He is having some irritation from his sternal wires into sites.  His sternum is stable and there is no evidence of a nonunion.  There is no evidence of infection currently.    I advised him that we could remove the sternal wires from those 2 sites under local anesthesia with some sedation in the operating room.  I described the procedure to him in detail.  I informed him of the indications, risks, benefits, and alternatives.  He understands risks include reaction to medication,  bleeding, and infection.  There also is the possibility of other unforeseeable complications.  We will plan to do this on an outpatient basis.  He accepts the risks and wishes to proceed.  Plan: Sternal wire removal with local anesthesia and intravenous sedation on Monday, 08/13/2017  [name], MD [facility] [phone]

## 2017-08-09 NOTE — Pre-Procedure Instructions (Signed)
Mark Cabrera  08/09/2017      [facility] [address] [place] [address] [place] [address] Phone: [phone] Fax: [phone]    Your procedure is scheduled on 08/13/2017.  Report to [facility] Admitting at 1120 A.M.  Call this number if you have problems the morning of surgery:  [phone]   Remember:  Nothing to eat or drink after midnight the night before your surgery.   Continue all medications as directed by your physician except follow these medication instructions before surgery below    Take these medicines the morning of surgery with A SIP OF WATER: Carvedilol (Coreg) Nitroglycerin (Nitrostat) - if needed  7 days prior to surgery STOP taking any Aspirin (unless otherwise instructed by your surgeon), Aleve, Naproxen, Ibuprofen, Motrin, Advil, Goody's, BC's, all herbal medications, fish oil, and all vitamins   How to Manage Your Diabetes Before and After Surgery  Why is it important to control my blood sugar before and after surgery? . Improving blood sugar levels before and after surgery helps healing and can limit problems. . A way of improving blood sugar control is eating a healthy diet by: o  Eating less sugar and carbohydrates o  Increasing activity/exercise o  Talking with your doctor about reaching your blood sugar goals . High blood sugars (greater than 180 mg/dL) can raise your risk of infections and slow your recovery, so you will need to focus on controlling your diabetes during the weeks before surgery. . Make sure that the doctor who takes care of your diabetes knows about your planned surgery including the date and location.  How do I manage my blood sugar before surgery? . Check your blood sugar at least 4 times a day, starting 2 days before surgery, to make sure that the level is not too high or low. o Check your blood sugar the morning of your surgery when you wake up and every 2  hours until you get to the Short Stay unit. . If your blood sugar is less than 70 mg/dL, you will need to treat for low blood sugar: o Do not take insulin. o Treat a low blood sugar (less than 70 mg/dL) with  cup of clear juice (cranberry or apple), 4 glucose tablets, OR glucose gel. Recheck blood sugar in 15 minutes after treatment (to make sure it is greater than 70 mg/dL). If your blood sugar is not greater than 70 mg/dL on recheck, call [phone] for further instructions. . Report your blood sugar to the short stay nurse when you get to Short Stay.  . If you are admitted to the hospital after surgery: o Your blood sugar will be checked by the staff and you will probably be given insulin after surgery (instead of oral diabetes medicines) to make sure you have good blood sugar levels. o The goal for blood sugar control after surgery is 80-180 mg/dL.    WHAT DO I DO ABOUT MY DIABETES MEDICATION?  Marland Kitchen Do not take oral diabetes medicines (pills) the morning of surgery. - DO NOT take your empagliflozin-metformin Hcl  . THE NIGHT BEFORE SURGERY, take 20 units of Lantus insulin.    . If your CBG is greater than 220 mg/dL, you may take  of your sliding scale (correction) dose of insulin.      Do not wear jewelry  Do not wear lotions, powders, or perfumes, or deodorant.  Men may shave face and neck.  Do  not bring valuables to the hospital.  [facility] is not responsible for any belongings or valuables.  Hearing aids, eyeglasses, contacts, dentures or bridgework may not be worn into surgery.  Leave your suitcase in the car.  After surgery it may be brought to your room.  For patients admitted to the hospital, discharge time will be determined by your treatment team.  Patients discharged the day of surgery will not be allowed to drive home.   Name and phone number of your driver:    Special instructions:   [name]- Preparing For Surgery  Before surgery, you can play an  important role. Because skin is not sterile, your skin needs to be as free of germs as possible. You can reduce the number of germs on your skin by washing with CHG (chlorahexidine gluconate) Soap before surgery.  CHG is an antiseptic cleaner which kills germs and bonds with the skin to continue killing germs even after washing.    Oral Hygiene is also important to reduce your risk of infection.  Remember - BRUSH YOUR TEETH THE MORNING OF SURGERY WITH YOUR REGULAR TOOTHPASTE  Please do not use if you have an allergy to CHG or antibacterial soaps. If your skin becomes reddened/irritated stop using the CHG.  Do not shave (including legs and underarms) for at least 48 hours prior to first CHG shower. It is OK to shave your face.  Please follow these instructions carefully.   1. Shower the NIGHT BEFORE SURGERY and the MORNING OF SURGERY with CHG.   2. If you chose to wash your hair, wash your hair first as usual with your normal shampoo.  3. After you shampoo, rinse your hair and body thoroughly to remove the shampoo.  4. Use CHG as you would any other liquid soap. You can apply CHG directly to the skin and wash gently with a scrungie or a clean washcloth.   5. Apply the CHG Soap to your body ONLY FROM THE NECK DOWN.  Do not use on open wounds or open sores. Avoid contact with your eyes, ears, mouth and genitals (private parts). Wash Face and genitals (private parts)  with your normal soap.  6. Wash thoroughly, paying special attention to the area where your surgery will be performed.  7. Thoroughly rinse your body with warm water from the neck down.  8. DO NOT shower/wash with your normal soap after using and rinsing off the CHG Soap.  9. Pat yourself dry with a CLEAN TOWEL.  10. Wear CLEAN PAJAMAS to bed the night before surgery, wear comfortable clothes the morning of surgery  11. Place CLEAN SHEETS on your bed the night of your first shower and DO NOT SLEEP WITH PETS.    Day of  Surgery:  Do not apply any deodorants/lotions.  Please wear clean clothes to the hospital/surgery center.   Remember to brush your teeth WITH YOUR REGULAR TOOTHPASTE.    Please read over the following fact sheets that you were given.

## 2017-08-10 ENCOUNTER — Other Ambulatory Visit: Payer: Self-pay

## 2017-08-10 ENCOUNTER — Encounter (HOSPITAL_COMMUNITY): Payer: Self-pay

## 2017-08-10 ENCOUNTER — Encounter (HOSPITAL_COMMUNITY)
Admission: RE | Admit: 2017-08-10 | Discharge: 2017-08-10 | Disposition: A | Discharge status: Home / Self Care | Payer: Medicare Other | Source: Ambulatory Visit | Attending: Thoracic Surgery (Cardiothoracic Vascular Surgery) | Admitting: Thoracic Surgery (Cardiothoracic Vascular Surgery)

## 2017-08-10 DIAGNOSIS — T85848A Pain due to other internal prosthetic devices, implants and grafts, initial encounter: Secondary | ICD-10-CM | POA: Diagnosis not present

## 2017-08-10 DIAGNOSIS — Z952 Presence of prosthetic heart valve: Secondary | ICD-10-CM | POA: Diagnosis not present

## 2017-08-10 DIAGNOSIS — Y838 Other surgical procedures as the cause of abnormal reaction of the patient, or of later complication, without mention of misadventure at the time of the procedure: Secondary | ICD-10-CM | POA: Diagnosis not present

## 2017-08-10 DIAGNOSIS — T8189XA Other complications of procedures, not elsewhere classified, initial encounter: Secondary | ICD-10-CM | POA: Diagnosis present

## 2017-08-10 DIAGNOSIS — M1710 Unilateral primary osteoarthritis, unspecified knee: Secondary | ICD-10-CM | POA: Diagnosis not present

## 2017-08-10 DIAGNOSIS — I4891 Unspecified atrial fibrillation: Secondary | ICD-10-CM | POA: Diagnosis not present

## 2017-08-10 DIAGNOSIS — Z87442 Personal history of urinary calculi: Secondary | ICD-10-CM | POA: Diagnosis not present

## 2017-08-10 DIAGNOSIS — Z951 Presence of aortocoronary bypass graft: Secondary | ICD-10-CM | POA: Diagnosis not present

## 2017-08-10 DIAGNOSIS — Z79899 Other long term (current) drug therapy: Secondary | ICD-10-CM | POA: Diagnosis not present

## 2017-08-10 DIAGNOSIS — E119 Type 2 diabetes mellitus without complications: Secondary | ICD-10-CM | POA: Diagnosis not present

## 2017-08-10 DIAGNOSIS — K219 Gastro-esophageal reflux disease without esophagitis: Secondary | ICD-10-CM | POA: Diagnosis not present

## 2017-08-10 DIAGNOSIS — E785 Hyperlipidemia, unspecified: Secondary | ICD-10-CM | POA: Diagnosis not present

## 2017-08-10 DIAGNOSIS — R0789 Other chest pain: Secondary | ICD-10-CM

## 2017-08-10 DIAGNOSIS — I10 Essential (primary) hypertension: Secondary | ICD-10-CM | POA: Diagnosis not present

## 2017-08-10 DIAGNOSIS — Z8673 Personal history of transient ischemic attack (TIA), and cerebral infarction without residual deficits: Secondary | ICD-10-CM | POA: Diagnosis not present

## 2017-08-10 DIAGNOSIS — X58XXXA Exposure to other specified factors, initial encounter: Secondary | ICD-10-CM | POA: Diagnosis not present

## 2017-08-10 DIAGNOSIS — I2510 Atherosclerotic heart disease of native coronary artery without angina pectoris: Secondary | ICD-10-CM | POA: Diagnosis not present

## 2017-08-10 DIAGNOSIS — G4730 Sleep apnea, unspecified: Secondary | ICD-10-CM | POA: Diagnosis not present

## 2017-08-10 LAB — COMPREHENSIVE METABOLIC PANEL
ALBUMIN: 4.1 g/dL (ref 3.5–5.0)
ALK PHOS: 70 U/L (ref 38–126)
ALT: 35 U/L (ref 17–63)
ANION GAP: 11 (ref 5–15)
AST: 36 U/L (ref 15–41)
BUN: 12 mg/dL (ref 6–20)
CHLORIDE: 103 mmol/L (ref 101–111)
CO2: 24 mmol/L (ref 22–32)
Calcium: 9.1 mg/dL (ref 8.9–10.3)
Creatinine, Ser: 1.17 mg/dL (ref 0.61–1.24)
GFR calc non Af Amer: >60 mL/min (ref >60)
GLUCOSE: 182 mg/dL — AB (ref 65–99)
POTASSIUM: 3.8 mmol/L (ref 3.5–5.1)
SODIUM: 138 mmol/L (ref 135–145)
Total Bilirubin: 0.9 mg/dL (ref 0.3–1.2)
Total Protein: 7.3 g/dL (ref 6.5–8.1)

## 2017-08-10 LAB — GLUCOSE, CAPILLARY: Glucose-Capillary: 188 mg/dL — ABNORMAL HIGH (ref 65–99)

## 2017-08-10 LAB — SURGICAL PCR SCREEN
MRSA, PCR: NEGATIVE
STAPHYLOCOCCUS AUREUS: NEGATIVE

## 2017-08-10 LAB — CBC
HCT: 41.4 % (ref 39.0–52.0)
HEMOGLOBIN: 13.5 g/dL (ref 13.0–17.0)
MCH: 28.2 pg (ref 26.0–34.0)
MCHC: 32.6 g/dL (ref 30.0–36.0)
MCV: 86.4 fL (ref 78.0–100.0)
PLATELETS: 183 10*3/uL (ref 150–400)
RBC: 4.79 MIL/uL (ref 4.22–5.81)
RDW: 12.9 % (ref 11.5–15.5)
WBC: 4.2 10*3/uL (ref 4.0–10.5)

## 2017-08-10 LAB — HEMOGLOBIN A1C
Hgb A1c MFr Bld: 8.3 % — ABNORMAL HIGH (ref 4.8–5.6)
MEAN PLASMA GLUCOSE: 191.51 mg/dL

## 2017-08-10 LAB — APTT: APTT: 28 s (ref 24–36)

## 2017-08-10 LAB — PROTIME-INR
INR: 0.98
Prothrombin Time: 12.9 seconds (ref 11.4–15.2)

## 2017-08-10 MED ORDER — VANCOMYCIN HCL 10 G IV SOLR
1500.0000 mg | INTRAVENOUS | Status: AC
  Administered 2017-08-13: 1500 mg via INTRAVENOUS
  Filled 2017-08-10: qty 1500

## 2017-08-10 NOTE — Progress Notes (Addendum)
PCP is Dr. Kalman Shan Cardiologist is Dr [name]  Denies fever or cough. Reports incision pain (chest) no changes in the pain Reports fasting CBG's run 200 or over  Spoke with Ryan at Dr hendrickson's office states to continue Asprin just not on the day of surgery Pt informed- voices understanding  Pt reports he no longer wears CPAP. Willeen Cass, FNP called about pt to review chart ( surgery is Mon)

## 2017-08-13 ENCOUNTER — Encounter (HOSPITAL_COMMUNITY)
Admission: RE | Disposition: A | Discharge status: Home / Self Care | Payer: Self-pay | Source: Ambulatory Visit | Attending: Thoracic Surgery (Cardiothoracic Vascular Surgery)

## 2017-08-13 ENCOUNTER — Ambulatory Visit (HOSPITAL_COMMUNITY): Payer: Medicare Other | Admitting: Emergency Medicine

## 2017-08-13 ENCOUNTER — Ambulatory Visit (HOSPITAL_COMMUNITY)
Admission: RE | Admit: 2017-08-13 | Discharge: 2017-08-13 | Disposition: A | Discharge status: Home / Self Care | Payer: Medicare Other | Source: Ambulatory Visit | Attending: Thoracic Surgery (Cardiothoracic Vascular Surgery) | Admitting: Thoracic Surgery (Cardiothoracic Vascular Surgery)

## 2017-08-13 ENCOUNTER — Encounter (HOSPITAL_COMMUNITY): Payer: Self-pay | Admitting: Certified Registered Nurse Anesthetist

## 2017-08-13 ENCOUNTER — Ambulatory Visit (HOSPITAL_COMMUNITY): Payer: Medicare Other | Admitting: Certified Registered Nurse Anesthetist

## 2017-08-13 DIAGNOSIS — M1710 Unilateral primary osteoarthritis, unspecified knee: Secondary | ICD-10-CM | POA: Diagnosis not present

## 2017-08-13 DIAGNOSIS — Z8673 Personal history of transient ischemic attack (TIA), and cerebral infarction without residual deficits: Secondary | ICD-10-CM | POA: Diagnosis not present

## 2017-08-13 DIAGNOSIS — Z79899 Other long term (current) drug therapy: Secondary | ICD-10-CM | POA: Insufficient documentation

## 2017-08-13 DIAGNOSIS — Z952 Presence of prosthetic heart valve: Secondary | ICD-10-CM | POA: Insufficient documentation

## 2017-08-13 DIAGNOSIS — Z87442 Personal history of urinary calculi: Secondary | ICD-10-CM | POA: Diagnosis not present

## 2017-08-13 DIAGNOSIS — T85848A Pain due to other internal prosthetic devices, implants and grafts, initial encounter: Secondary | ICD-10-CM | POA: Diagnosis not present

## 2017-08-13 DIAGNOSIS — E785 Hyperlipidemia, unspecified: Secondary | ICD-10-CM | POA: Diagnosis not present

## 2017-08-13 DIAGNOSIS — I5030 Unspecified diastolic (congestive) heart failure: Secondary | ICD-10-CM | POA: Diagnosis not present

## 2017-08-13 DIAGNOSIS — E119 Type 2 diabetes mellitus without complications: Secondary | ICD-10-CM | POA: Diagnosis not present

## 2017-08-13 DIAGNOSIS — I10 Essential (primary) hypertension: Secondary | ICD-10-CM | POA: Insufficient documentation

## 2017-08-13 DIAGNOSIS — I110 Hypertensive heart disease with heart failure: Secondary | ICD-10-CM | POA: Diagnosis not present

## 2017-08-13 DIAGNOSIS — Z951 Presence of aortocoronary bypass graft: Secondary | ICD-10-CM | POA: Insufficient documentation

## 2017-08-13 DIAGNOSIS — G4730 Sleep apnea, unspecified: Secondary | ICD-10-CM | POA: Insufficient documentation

## 2017-08-13 DIAGNOSIS — I2510 Atherosclerotic heart disease of native coronary artery without angina pectoris: Secondary | ICD-10-CM | POA: Diagnosis not present

## 2017-08-13 DIAGNOSIS — K219 Gastro-esophageal reflux disease without esophagitis: Secondary | ICD-10-CM | POA: Diagnosis not present

## 2017-08-13 DIAGNOSIS — X58XXXA Exposure to other specified factors, initial encounter: Secondary | ICD-10-CM | POA: Insufficient documentation

## 2017-08-13 DIAGNOSIS — R0789 Other chest pain: Secondary | ICD-10-CM

## 2017-08-13 DIAGNOSIS — T8484XA Pain due to internal orthopedic prosthetic devices, implants and grafts, initial encounter: Secondary | ICD-10-CM | POA: Diagnosis not present

## 2017-08-13 DIAGNOSIS — Y838 Other surgical procedures as the cause of abnormal reaction of the patient, or of later complication, without mention of misadventure at the time of the procedure: Secondary | ICD-10-CM | POA: Insufficient documentation

## 2017-08-13 DIAGNOSIS — I4891 Unspecified atrial fibrillation: Secondary | ICD-10-CM | POA: Insufficient documentation

## 2017-08-13 HISTORY — PX: STERNAL WIRES REMOVAL: SHX2441

## 2017-08-13 LAB — GLUCOSE, CAPILLARY
GLUCOSE-CAPILLARY: 203 mg/dL — AB (ref 65–99)
Glucose-Capillary: 166 mg/dL — ABNORMAL HIGH (ref 65–99)
Glucose-Capillary: 117 mg/dL — ABNORMAL HIGH (ref 65–99)

## 2017-08-13 SURGERY — REMOVAL, STERNAL WIRE
Anesthesia: General | Site: Chest

## 2017-08-13 MED ORDER — 0.9 % SODIUM CHLORIDE (POUR BTL) OPTIME
TOPICAL | Status: DC | PRN
  Administered 2017-08-13: 1000 mL

## 2017-08-13 MED ORDER — MIDAZOLAM HCL 5 MG/5ML IJ SOLN
INTRAMUSCULAR | Status: DC | PRN
  Administered 2017-08-13: 2 mg via INTRAVENOUS

## 2017-08-13 MED ORDER — FENTANYL CITRATE (PF) 100 MCG/2ML IJ SOLN
INTRAMUSCULAR | Status: DC | PRN
  Administered 2017-08-13: 25 ug via INTRAVENOUS

## 2017-08-13 MED ORDER — PROPOFOL 10 MG/ML IV BOLUS
INTRAVENOUS | Status: AC
  Filled 2017-08-13: qty 20

## 2017-08-13 MED ORDER — PROPOFOL 10 MG/ML IV BOLUS
INTRAVENOUS | Status: DC | PRN
  Administered 2017-08-13: 20 mg via INTRAVENOUS

## 2017-08-13 MED ORDER — EPHEDRINE SULFATE 50 MG/ML IJ SOLN
INTRAMUSCULAR | Status: AC
  Filled 2017-08-13: qty 1

## 2017-08-13 MED ORDER — LIDOCAINE HCL 1 % IJ SOLN
INTRAMUSCULAR | Status: DC | PRN
  Administered 2017-08-13: 11 mL

## 2017-08-13 MED ORDER — LIDOCAINE HCL (PF) 1 % IJ SOLN
INTRAMUSCULAR | Status: AC
  Filled 2017-08-13: qty 30

## 2017-08-13 MED ORDER — OXYCODONE HCL 5 MG PO TABS: 5.0000 mg | ORAL_TABLET | Freq: Four times a day (QID) | ORAL | Status: DC | PRN

## 2017-08-13 MED ORDER — PHENYLEPHRINE HCL 10 MG/ML IJ SOLN
INTRAMUSCULAR | Status: AC
  Filled 2017-08-13: qty 1

## 2017-08-13 MED ORDER — ONDANSETRON HCL 4 MG/2ML IJ SOLN
INTRAMUSCULAR | Status: AC
  Filled 2017-08-13: qty 2

## 2017-08-13 MED ORDER — PHENYLEPHRINE 40 MCG/ML (10ML) SYRINGE FOR IV PUSH (FOR BLOOD PRESSURE SUPPORT)
PREFILLED_SYRINGE | INTRAVENOUS | Status: AC
  Filled 2017-08-13: qty 10

## 2017-08-13 MED ORDER — FENTANYL CITRATE (PF) 250 MCG/5ML IJ SOLN
INTRAMUSCULAR | Status: AC
  Filled 2017-08-13: qty 5

## 2017-08-13 MED ORDER — MIDAZOLAM HCL 2 MG/2ML IJ SOLN
INTRAMUSCULAR | Status: AC
  Filled 2017-08-13: qty 2

## 2017-08-13 MED ORDER — PROPOFOL 500 MG/50ML IV EMUL
INTRAVENOUS | Status: DC | PRN
  Administered 2017-08-13: 75 ug/kg/min via INTRAVENOUS

## 2017-08-13 MED ORDER — OXYCODONE HCL 5 MG PO TABS: 5.0000 mg | ORAL_TABLET | Freq: Four times a day (QID) | ORAL | 0 refills | Status: DC | PRN

## 2017-08-13 MED ORDER — LACTATED RINGERS IV SOLN
INTRAVENOUS | Status: DC
  Administered 2017-08-13 (×2): via INTRAVENOUS

## 2017-08-13 MED ORDER — ONDANSETRON HCL 4 MG/2ML IJ SOLN: 4.0000 mg | Freq: Once | INTRAMUSCULAR | Status: DC | PRN

## 2017-08-13 MED ORDER — FENTANYL CITRATE (PF) 100 MCG/2ML IJ SOLN: 25.0000 ug | INTRAMUSCULAR | Status: DC | PRN

## 2017-08-13 MED ORDER — ONDANSETRON HCL 4 MG/2ML IJ SOLN
INTRAMUSCULAR | Status: DC | PRN
  Administered 2017-08-13: 4 mg via INTRAVENOUS

## 2017-08-13 SURGICAL SUPPLY — 52 items
BLADE SURG 10 STRL SS (BLADE) ×2 IMPLANT
BRUSH SCRUB EZ PLAIN DRY (MISCELLANEOUS) ×6 IMPLANT
CANISTER SUCT 3000ML PPV (MISCELLANEOUS) ×3 IMPLANT
CLIP VESOCCLUDE MED 6/CT (CLIP) ×3 IMPLANT
CONT SPEC 4OZ CLIKSEAL STRL BL (MISCELLANEOUS) IMPLANT
COVER SURGICAL LIGHT HANDLE (MISCELLANEOUS) ×2 IMPLANT
DRAPE INCISE IOBAN 66X45 STRL (DRAPES) ×2 IMPLANT
DRAPE LAPAROSCOPIC ABDOMINAL (DRAPES) ×3 IMPLANT
DRAPE SLUSH/WARMER DISC (DRAPES) IMPLANT
ELECT REM PT RETURN 9FT ADLT (ELECTROSURGICAL) ×3
ELECTRODE REM PT RTRN 9FT ADLT (ELECTROSURGICAL) ×1 IMPLANT
GAUZE SPONGE 4X4 12PLY STRL (GAUZE/BANDAGES/DRESSINGS) ×3 IMPLANT
GLOVE SURG SIGNA 7.5 PF LTX (GLOVE) ×3 IMPLANT
GOWN STRL REUS W/ TWL LRG LVL3 (GOWN DISPOSABLE) ×1 IMPLANT
GOWN STRL REUS W/ TWL XL LVL3 (GOWN DISPOSABLE) ×1 IMPLANT
GOWN STRL REUS W/TWL LRG LVL3 (GOWN DISPOSABLE) ×6
GOWN STRL REUS W/TWL XL LVL3 (GOWN DISPOSABLE) ×3
HEMOSTAT POWDER SURGIFOAM 1G (HEMOSTASIS) IMPLANT
KIT BASIN OR (CUSTOM PROCEDURE TRAY) ×3 IMPLANT
KIT TURNOVER KIT B (KITS) ×3 IMPLANT
NDL 25GX 5/8IN NON SAFETY (NEEDLE) IMPLANT
NDL HYPO 25GX1X1/2 BEV (NEEDLE) IMPLANT
NEEDLE 25GX 5/8IN NON SAFETY (NEEDLE) IMPLANT
NEEDLE HYPO 25GX1X1/2 BEV (NEEDLE) ×3 IMPLANT
NS IRRIG 1000ML POUR BTL (IV SOLUTION) ×3 IMPLANT
PACK CHEST (CUSTOM PROCEDURE TRAY) ×3 IMPLANT
PAD ARMBOARD 7.5X6 YLW CONV (MISCELLANEOUS) ×6 IMPLANT
PENCIL BUTTON HOLSTER BLD 10FT (ELECTRODE) ×2 IMPLANT
SPONGE LAP 18X18 X RAY DECT (DISPOSABLE) IMPLANT
SUT STEEL 6MS V (SUTURE) IMPLANT
SUT STEEL STERNAL CCS#1 18IN (SUTURE) IMPLANT
SUT STEEL SZ 6 DBL 3X14 BALL (SUTURE) IMPLANT
SUT VIC AB 1 CTX 36 (SUTURE) ×3
SUT VIC AB 1 CTX36XBRD ANBCTR (SUTURE) IMPLANT
SUT VIC AB 2-0 CT1 27 (SUTURE) ×6
SUT VIC AB 2-0 CT1 TAPERPNT 27 (SUTURE) IMPLANT
SUT VIC AB 2-0 CTX 27 (SUTURE) IMPLANT
SUT VIC AB 2-0 SH 27 (SUTURE)
SUT VIC AB 2-0 SH 27XBRD (SUTURE) ×1 IMPLANT
SUT VIC AB 3-0 X1 27 (SUTURE) ×5 IMPLANT
SWAB COLLECTION DEVICE MRSA (MISCELLANEOUS) IMPLANT
SWAB CULTURE ESWAB REG 1ML (MISCELLANEOUS) IMPLANT
SYR BULB IRRIGATION 50ML (SYRINGE) ×2 IMPLANT
SYR CONTROL 10ML LL (SYRINGE) ×2 IMPLANT
TAPE CLOTH SURG 4X10 WHT LF (GAUZE/BANDAGES/DRESSINGS) ×2 IMPLANT
TOWEL GREEN STERILE (TOWEL DISPOSABLE) ×3 IMPLANT
TOWEL GREEN STERILE FF (TOWEL DISPOSABLE) ×3 IMPLANT
TRAY FOLEY MTR SLVR 14FR STAT (SET/KITS/TRAYS/PACK) IMPLANT
TUBE CONNECTING 12'X1/4 (SUCTIONS) ×1
TUBE CONNECTING 12X1/4 (SUCTIONS) ×1 IMPLANT
WATER STERILE IRR 1000ML POUR (IV SOLUTION) ×3 IMPLANT
YANKAUER SUCT BULB TIP NO VENT (SUCTIONS) ×2 IMPLANT

## 2017-08-13 NOTE — Op Note (Signed)
NAME: CARDELL, RACHEL MEDICAL RECORD ER:[id number] ACCOUNT [id number] DATE OF BIRTH:Nov 04, 1950 FACILITY: [facility] LOCATION: [facility] PHYSICIAN:Tukker Byrns Chaya Jan, MD  OPERATIVE REPORT  DATE OF PROCEDURE:  08/13/2017  PREOPERATIVE DIAGNOSIS:  Pain due to sternal wires.  POSTOPERATIVE DIAGNOSIS:  Pain due to sternal wires.  PROCEDURE:  Sternal wire removal.  SURGEON:  Modesto Charon, MD  ASSISTANT:  None.  ANESTHESIA:  Local with intravenous sedation.  FINDINGS:  Wires were removed intact.  No signs of infection.  Bone appeared well healed.  CLINICAL NOTE:  The patient is a 67 year old man who had undergone aortic valve replacement and coronary bypass grafting x2 in January of 2018.  He complains of irritation from his sternal wires in 2 spots.  On exam, there was no evidence of infection,  but the patient was offered the option of sternal wire removal.  He wished to have all the wires removed.  The indications, risks, benefits, and alternatives were discussed in detail with the patient.  He understood and accepted the risks and agreed to  proceed.  OPERATIVE NOTE:  The patient was brought to the operating room on 08/13/2017.  He was given intravenous sedation and monitored by the anesthesia service.  Intravenous antibiotics were administered.  Sequential compression devices were placed on the  calves for DVT prophylaxis.  The chest was prepped and draped in the usual sterile fashion.  After performing a timeout, 1% lidocaine was used to anesthetize the areas for incision, one over the manubrium, one along the midportion of the line, and one at  the lower portion of the incision.  A total of 9 mL of lidocaine was used in all.  After ensuring adequate sedation and local anesthetic effect, an incision was made over the manubrium.  It was carried through the skin and subcutaneous tissue.  The  wires were identified.  The wires were cut and removed intact without difficulty.  Next, an  incision was made in the midportion of the incision, and the 3 double wires in the midportion of the incision were dissected out, cut, and removed.  Finally, a  small incision was made anterior to the xiphoid, and the lower sternal wire was removed through this.  All the incisions were copiously irrigated with warm saline.  The wounds were closed with a 2-0 Vicryl in the subcutaneous tissue and a 3-0 Vicryl  subcuticular suture.  All sponge, needle and instrument counts were correct at the end of the procedure.  The patient was taken from the operating room to the [facility] in good condition.  LN/NUANCE  D:08/13/2017 T:08/13/2017 JOB:[id number]

## 2017-08-13 NOTE — Interval H&P Note (Signed)
History and Physical Interval Note: Mr. Leeman wants all the wires removed   08/13/2017 1:46 PM  [name]  has presented today for surgery, with the diagnosis of PAIN FROM STERNAL WIRES  The various methods of treatment have been discussed with the patient and family. After consideration of risks, benefits and other options for treatment, the patient has consented to  Procedure(s): STERNAL WIRES REMOVAL (N/A) as a surgical intervention .  The patient's history has been reviewed, patient examined, no change in status, stable for surgery.  I have reviewed the patient's chart and labs.  Questions were answered to the patient's satisfaction.     [name]

## 2017-08-13 NOTE — Transfer of Care (Signed)
Immediate Anesthesia Transfer of Care Note  Patient: MALAKI KOURY  Procedure(s) Performed: STERNAL WIRES REMOVAL (N/A Chest)  Patient Location: PACU  Anesthesia Type:MAC  Level of Consciousness: awake and drowsy  Airway & Oxygen Therapy: Patient Spontanous Breathing  Post-op Assessment: Report given to RN, Post -op Vital signs reviewed and stable and Patient moving all extremities X 4  Post vital signs: Reviewed and stable  Last Vitals:  Vitals Value Taken Time  BP 117/84 08/13/2017  4:03 PM  Temp 36.7 C 08/13/2017  4:03 PM  Pulse 67 08/13/2017  4:04 PM  Resp 10 08/13/2017  4:04 PM  SpO2 97 % 08/13/2017  4:04 PM  Vitals shown include unvalidated device data.  Last Pain:  Vitals:   08/13/17 1603  TempSrc:   PainSc: 0-No pain      Patients Stated Pain Goal: 3 (51/10/21 1173)  Complications: No apparent anesthesia complications

## 2017-08-13 NOTE — Anesthesia Postprocedure Evaluation (Signed)
Anesthesia Post Note  Patient: Mark Cabrera  Procedure(s) Performed: STERNAL WIRES REMOVAL (N/A Chest)     Patient location during evaluation: PACU Anesthesia Type: General Level of consciousness: awake and alert Pain management: pain level controlled Vital Signs Assessment: post-procedure vital signs reviewed and stable Respiratory status: spontaneous breathing, nonlabored ventilation, respiratory function stable and patient connected to nasal cannula oxygen Cardiovascular status: stable and blood pressure returned to baseline Postop Assessment: no apparent nausea or vomiting Anesthetic complications: no    Last Vitals:  Vitals:   08/13/17 1633 08/13/17 1653  BP: 130/77 (!) 154/63  Pulse: (!) 58 66  Resp: 11 18  Temp:    SpO2: 99% 100%    Last Pain:  Vitals:   08/13/17 1653  TempSrc:   PainSc: 0-No pain                 Beronica Lansdale COKER

## 2017-08-13 NOTE — Discharge Instructions (Signed)
Do not drive or engage in heavy physical activity for 48 hours  You may remove the dressing and take a shower on Wednesday 08/15/2017  After that you may leave incision uncovered. If there is any drainage keep covered with a dry gauze  You have a prescription for oxycodone, a narcotic pain reliever. You may use as directed. You may use acetaminophen (Tylenol) in addition to or instead of, the oxycodone.  Do not drive within 4 hours of taking oxycodone  Call [phone] if you develop notice excessive drainage, swelling or redness around the incision  My office will contact you with follow up information

## 2017-08-13 NOTE — Progress Notes (Signed)
Please call any of the following numbers to update family after surgery: Carlene (Sister) [phone], Izell [place] (Brother in Sports coach) [phone] or Pamala Hurry [phone]

## 2017-08-13 NOTE — Brief Op Note (Signed)
08/13/2017  4:10 PM  PATIENT:  Mark Cabrera  67 y.o. male  PRE-OPERATIVE DIAGNOSIS:  PAIN FROM STERNAL WIRES  POST-OPERATIVE DIAGNOSIS:  PAIN FROM STERNAL WIRES  PROCEDURE:  Procedure(s): STERNAL WIRES REMOVAL (N/A)  SURGEON:  Surgeon(s) and Role:    * [name], MD - Primary  PHYSICIAN ASSISTANT:   ASSISTANTS: none   ANESTHESIA:   local and IV sedation  EBL:  3 mL   BLOOD ADMINISTERED:none  DRAINS: none   LOCAL MEDICATIONS USED:  LIDOCAINE  and Amount: 9 ml  SPECIMEN:  Source of Specimen:  wires  DISPOSITION OF SPECIMEN:  PATHOLOGY  COUNTS:  YES  TOURNIQUET:  * No tourniquets in log *  DICTATION: .Other Dictation: Dictation Number -  PLAN OF CARE: Discharge to home after PACU  PATIENT DISPOSITION:  PACU - hemodynamically stable.   Delay start of Pharmacological VTE agent (>24hrs) due to surgical blood loss or risk of bleeding: not applicable

## 2017-08-13 NOTE — Anesthesia Preprocedure Evaluation (Signed)
Anesthesia Evaluation  Patient identified by MRN, date of birth, ID band Patient awake    Reviewed: Allergy & Precautions, NPO status , Patient's Chart, lab work & pertinent test results  Airway Mallampati: II  TM Distance: >3 FB Neck ROM: Full    Dental  (+) Teeth Intact, Missing,    Pulmonary    breath sounds clear to auscultation       Cardiovascular hypertension,  Rhythm:Regular Rate:Normal     Neuro/Psych    GI/Hepatic   Endo/Other  diabetes  Renal/GU      Musculoskeletal   Abdominal   Peds  Hematology   Anesthesia Other Findings   Reproductive/Obstetrics                             Anesthesia Physical Anesthesia Plan  ASA: III  Anesthesia Plan: General   Post-op Pain Management:    Induction: Intravenous  PONV Risk Score and Plan: Ondansetron  Airway Management Planned: LMA  Additional Equipment:   Intra-op Plan:   Post-operative Plan:   Informed Consent: I have reviewed the patients History and Physical, chart, labs and discussed the procedure including the risks, benefits and alternatives for the proposed anesthesia with the patient or authorized representative who has indicated his/her understanding and acceptance.   Dental advisory given  Plan Discussed with: CRNA and Anesthesiologist  Anesthesia Plan Comments:         Anesthesia Quick Evaluation

## 2017-08-14 ENCOUNTER — Encounter (HOSPITAL_COMMUNITY): Payer: Self-pay | Admitting: Thoracic Surgery (Cardiothoracic Vascular Surgery)

## 2017-08-16 ENCOUNTER — Other Ambulatory Visit: Payer: Self-pay | Admitting: Cardiovascular Disease

## 2017-08-16 ENCOUNTER — Telehealth: Payer: Self-pay | Admitting: Cardiovascular Disease

## 2017-08-16 NOTE — Telephone Encounter (Signed)
Pt calling requesting a Rx for an antibiotic for a dental procedure that the pt is having. Pt would like to be called back at [phone], concerning this matter. Please address

## 2017-08-17 ENCOUNTER — Other Ambulatory Visit: Payer: Self-pay

## 2017-08-17 MED ORDER — AZITHROMYCIN 500 MG PO TABS: ORAL_TABLET | ORAL | 0 refills | Status: DC

## 2017-08-17 NOTE — Telephone Encounter (Signed)
He will need antibiotic prophylaxis. He is PCN allergic. Can we follow protocol for PCN allergic patients and call in the antibiotic for him? I think it is Keflex. Thanks, Gerald Stabs

## 2017-08-17 NOTE — Telephone Encounter (Signed)
Spoke with the patient and informed him that we ordered Azithromycin 500 mg tablets x 4 to be taken 1 hour prior to dental appt.  He verbalized understanding.

## 2017-08-28 ENCOUNTER — Encounter: Payer: Self-pay | Admitting: Thoracic Surgery (Cardiothoracic Vascular Surgery)

## 2017-08-28 ENCOUNTER — Ambulatory Visit (INDEPENDENT_AMBULATORY_CARE_PROVIDER_SITE_OTHER): Payer: Self-pay | Admitting: Thoracic Surgery (Cardiothoracic Vascular Surgery)

## 2017-08-28 VITALS — BP 118/62 | HR 82 | Resp 20 | Ht 68.0 in | Wt 218.0 lb

## 2017-08-28 DIAGNOSIS — Z09 Encounter for follow-up examination after completed treatment for conditions other than malignant neoplasm: Secondary | ICD-10-CM

## 2017-08-28 DIAGNOSIS — Z951 Presence of aortocoronary bypass graft: Secondary | ICD-10-CM

## 2017-08-28 DIAGNOSIS — Z952 Presence of prosthetic heart valve: Secondary | ICD-10-CM

## 2017-08-28 NOTE — Progress Notes (Signed)
[address]       [place] [address]             [phone]     HPI: [name] returns for a scheduled follow-up visit  Mark Cabrera is a 67 year old man who had aortic valve replacement and coronary bypass grafting x2 in January 2018.  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  I recently saw him in the office and he was having a lot of pain from his sternal wires.  2 spots where the wires were easily palpable under the skin it was causing a lot of pain.  He wished to have all of his wires removed.  We did a sternal wire removal on 08/13/2017.  The sternum was well-healed.  The procedure was uncomplicated and he went home the same day.  He says that it is a little sore but it feels better than it did preoperatively.  He is not taking any narcotics.  He is occasionally taking Tylenol.  Past Medical History:  Diagnosis Date  . Acute diastolic congestive heart failure ([facility])   . Aortic valve stenosis s/p AVR 2018  . Atrial fibrillation ([facility]) - post-op CABG    04/2016 CHA2DS2VAS score = 5  . Atypical nevi   . Coronary artery disease s/p 2 vessel CABG   . Depression    "years ago"  . Diabetes mellitus   . Dyspnea    in the past   . GERD (gastroesophageal reflux disease)   . Heart murmur   . Hepatic cirrhosis ([facility]) 06/29/2017  . History of kidney stones   . Hyperlipidemia    hx of transaminitis secondary to statin and he has decided not to use statins secondary to potential side effects.  . Hypertension   . Nephrolithiasis   . Osteoarthritis, knee   . Sleep apnea     Central apnea. Not using cpap  . Stroke [facility] 2007  . Transaminitis     Statin-induced  . 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 2-D echo was normal no embolic source found.    Current Outpatient Medications  Medication Sig Dispense Refill  . acetaminophen (TYLENOL) 325 MG tablet Take 2 tablets (650 mg total) by mouth every 4 (four) hours as needed for headache or mild pain.    . Ascorbic Acid (VITAMIN C PO) Take 1 tablet by mouth daily.    Marland Kitchen aspirin EC 81 MG tablet Take 1 tablet (81 mg total) by mouth daily. 90 tablet 3  . BAYER MICROLET LANCETS lancets Check blood sugar 3 times a day as instructed 100 each 12  . carvedilol (COREG) 3.125 MG tablet Take 1 tablet (3.125 mg total) by mouth 2 (two) times daily with a meal. 60 tablet 11  . Empagliflozin-Metformin HCl (SYNJARDY) 07-998 MG TABS Take 1 tablet by mouth 2 (two) times daily. 180 tablet 4  . furosemide (LASIX) 40 MG tablet TAKE 1 TABLET BY MOUTH EVERY MORNING AND 2 TABLETS EVERY NIGHT 270 tablet 0  . gabapentin (NEURONTIN) 100 MG capsule Take 1 capsule (100 mg total) by mouth 2 (two) times daily. 180 capsule 2  . glucose blood (CONTOUR NEXT TEST) test strip Check blood sugar 3  times a day as instructed 100 each 12  . ibuprofen (ADVIL,MOTRIN) 200 MG tablet Take 400 mg by mouth every 6 (six) hours as needed for headache or moderate pain.    . Insulin Glargine (LANTUS) 100 UNIT/ML Solostar Pen Inject 40 Units into the skin daily at 10 pm. 15 mL 11  . insulin lispro (HUMALOG KWIKPEN) 100 UNIT/ML KiwkPen Please take 14 units with meals (Patient taking differently: Inject 14 Units into the skin 3 (three) times daily with meals. ) 15 mL 3  . Insulin Pen Needle (PEN NEEDLES) 31G X 5 MM MISC 1 each by Does not apply route 3 (three) times daily. 100 each 12  . KLOR-CON M20 20 MEQ tablet TAKE 1 TABLET BY MOUTH ONCE DAILY 90 tablet 2  . nitroGLYCERIN (NITROSTAT) 0.4 MG SL tablet Place 1 tablet (0.4 mg total) under the tongue every 5 (five) minutes as needed for chest pain. 25 tablet 1  . tamsulosin (FLOMAX) 0.4 MG CAPS capsule  Take 1 capsule (0.4 mg total) by mouth daily after supper. 30 capsule 11   No current facility-administered medications for this visit.     Physical Exam BP 118/62   Pulse 82   Resp 20   Ht 5\' 8"  (1.727 m)   Wt 218 lb (98.9 kg)   SpO2 99% Comment: RA  BMI 33.39 kg/m  67 year old man in no acute distress Alert and oriented x3 with no focal deficits Lungs clear Incisions clean dry and intact   Impression: [name] is a 67 year old man who is about a year and a half out from aortic valve replacement and coronary bypass grafting.  He was having sternal pain which was felt to be due to his wires.  I did a sternal wire removal a couple of weeks ago and he has had a good result with that. His incisions are healing well, and he is having minimal discomfort.  I cautioned him to avoid swimming or submerging the incisions for another 2 weeks.  Other than that his activities are unrestricted.  Plan: Follow-up with cardiology.  I would be happy to see [name] back again at any time if I can be of any further assistance with his care  [name], MD [facility] [phone]

## 2017-09-06 ENCOUNTER — Other Ambulatory Visit: Payer: Self-pay | Admitting: Cardiovascular Disease

## 2017-09-07 NOTE — Telephone Encounter (Signed)
OK to refill

## 2017-09-07 NOTE — Telephone Encounter (Signed)
Dr. [name] filled Flomax last year, for #30 with 12 refills. Is he ok with filling this again? Or does patient need to contact PCP?

## 2017-09-13 ENCOUNTER — Other Ambulatory Visit: Payer: Self-pay | Admitting: Cardiovascular Disease

## 2017-11-02 ENCOUNTER — Other Ambulatory Visit: Payer: Self-pay | Admitting: Cardiovascular Disease

## 2017-11-19 ENCOUNTER — Other Ambulatory Visit: Payer: Self-pay | Admitting: Cardiovascular Disease

## 2017-11-21 ENCOUNTER — Other Ambulatory Visit: Payer: Self-pay | Admitting: Cardiovascular Disease

## 2017-11-21 MED ORDER — CARVEDILOL 3.125 MG PO TABS: ORAL_TABLET | ORAL | 0 refills | Status: DC

## 2017-11-21 NOTE — Telephone Encounter (Signed)
Pt's medication was sent to pt's pharmacy as requested. Confirmation received.  °

## 2017-11-21 NOTE — Telephone Encounter (Signed)
New Message          *STAT* If patient is at the pharmacy, call can be transferred to refill team.   1. Which medications need to be refilled? (please list name of each medication and dose if known) Carvedilol  2. Which pharmacy/location (including street and city if local pharmacy) is medication to be sent to?[facility] on  [address]  3. Do they need a 30 day or 90 day supply? 30  Patient will be out on Saturday.

## 2017-11-26 ENCOUNTER — Encounter (HOSPITAL_COMMUNITY): Payer: Self-pay | Admitting: Family Medicine

## 2017-11-26 ENCOUNTER — Ambulatory Visit (HOSPITAL_COMMUNITY)
Admission: EM | Admit: 2017-11-26 | Discharge: 2017-11-26 | Disposition: A | Discharge status: Home / Self Care | Payer: Medicare Other | Attending: Family Medicine | Admitting: Family Medicine

## 2017-11-26 DIAGNOSIS — B029 Zoster without complications: Secondary | ICD-10-CM

## 2017-11-26 MED ORDER — VALACYCLOVIR HCL 1 G PO TABS: 1000.0000 mg | ORAL_TABLET | Freq: Three times a day (TID) | ORAL | 0 refills | Status: AC

## 2017-11-26 NOTE — ED Triage Notes (Signed)
Pt presents with a rash on right chest area that spreads around to right back area that he thinks to be shingles; area is is painful and burns.

## 2017-11-26 NOTE — ED Provider Notes (Signed)
[place] [facility]    CSN: [id number] Arrival date & time: 11/26/17  1542     History   Chief Complaint Chief Complaint  Patient presents with  . Shingles    HPI Mark Cabrera is a 67 y.o. male.   [place] 67 year old [place] and complains of a 4 day old progressive right chest dermatomal rash that is painful to touch.  He has a history of stroke which is made his right side very sensitive to light touch.     Past Medical History:  Diagnosis Date  . Acute diastolic congestive heart failure ([facility])   . Aortic valve stenosis s/p AVR 2018  . Atrial fibrillation (Mark [facility]) - post-op CABG    04/2016 CHA2DS2VAS score = 5  . Atypical nevi   . Coronary artery disease s/p 2 vessel CABG   . Depression    "years ago"  . Diabetes mellitus   . Dyspnea    in the past   . GERD (gastroesophageal reflux disease)   . Heart murmur   . Hepatic cirrhosis (Mark [facility]) 06/29/2017  . History of kidney stones   . Hyperlipidemia    hx of transaminitis secondary to statin and he has decided not to use statins secondary to potential side effects.  . Hypertension   . Nephrolithiasis   . Osteoarthritis, knee   . Sleep apnea     Central apnea. Not using cpap  . Stroke [facility]) 2007  . Transaminitis     Statin-induced  . 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 MRI of the brain 2007 showed acute left lateral medullary infarct in the distribution of left posterior inferior cerebral artery , narrowing of the left vertebral with severe diminution of flow Cabrera acute occlusion. 2-D echo was normal no embolic source found.    Patient Active Problem List   Diagnosis Date Noted  . Hepatic cirrhosis (Mark [facility]) 06/29/2017  . Candida albicans infection 01/09/2017  . Healthcare maintenance 12/16/2016  . Lipoma of left upper extremity 09/12/2016  . Chronic knee pain 07/04/2016  . Decreased visual acuity 07/04/2016  . Peripheral  neuropathic pain 05/12/2016  . Pericardial effusion a. subxiphoid pericardial window on 04/21/2016 04/28/2016  . Pleural effusion on left, s/p throacentesis 04/18/16 04/28/2016  . 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  . CAD (coronary artery disease), native coronary artery   . Acute diastolic CHF (congestive heart failure) (Mark Cabrera)   . Sleep apnea 08/02/2009  . Diabetes mellitus with neurological manifestation (Mark [facility]rera) 10/18/2007  . Dyslipidemia 05/18/2006  . Hypertensive heart disease   . History of dissection of vertebral artery  (Mark Cabrera) 08/02/2005    Past Surgical History:  Procedure Laterality Date  . AORTIC VALVE REPLACEMENT N/A 03/30/2016   Procedure: AORTIC VALVE REPLACEMENT (AVR);  Surgeon: [name], MD;  Location: [facility];  Service: Open Heart Surgery;  Laterality: N/A;  . CARDIAC CATHETERIZATION N/A 03/15/2016   Procedure: Right/Left Heart Cath and Coronary Angiography;  Surgeon: Burnell Blanks, MD;  Location: [facility];  Service: Cardiovascular;  Laterality: N/A;  . CLIPPING OF ATRIAL APPENDAGE N/A 03/30/2016   Procedure: CLIPPING OF LEFT ATRIAL APPENDAGE;  Surgeon: [name], MD;  Location: [facility];  Service: Open Heart Surgery;  Laterality: N/A;  . CORONARY ARTERY BYPASS GRAFT N/A 03/30/2016   Procedure: CORONARY ARTERY BYPASS GRAFTING (CABG) Times Two;  Surgeon: [name], MD;  Location: [facility];  Service: Open Heart Surgery;  Laterality: N/A;  . KNEE ARTHROSCOPY W/ PARTIAL MEDIAL MENISCECTOMY  05/12/2005   right, performed by Dr. French Ana for torn medial meniscus.  Marland Kitchen ROTATOR CUFF REPAIR Right 2016  . STERNAL WIRES REMOVAL N/A 08/13/2017   Procedure: STERNAL WIRES REMOVAL;  Surgeon: [name], MD;  Location: [facility];  Service: Thoracic;  Laterality: N/A;  . SUBXYPHOID PERICARDIAL WINDOW N/A 04/21/2016   Procedure: SUBXYPHOID PERICARDIAL WINDOW;  Surgeon: [name], MD;  Location:  [facility];  Service: Thoracic;  Laterality: N/A;  . TEE WITHOUT CARDIOVERSION N/A 03/30/2016   Procedure: TRANSESOPHAGEAL ECHOCARDIOGRAM (TEE);  Surgeon: [name], MD;  Location: [facility];  Service: Open Heart Surgery;  Laterality: N/A;  . TEE WITHOUT CARDIOVERSION N/A 04/21/2016   Procedure: TRANSESOPHAGEAL ECHOCARDIOGRAM (TEE);  Surgeon: [name], MD;  Location: [facility];  Service: Thoracic;  Laterality: N/A;       Home Medications    Prior to Admission medications   Medication Sig Start Date End Date Taking? Authorizing Provider  acetaminophen (TYLENOL) 325 MG tablet Take 2 tablets (650 mg total) by mouth every 4 (four) hours as needed for headache Cabrera mild pain. 04/28/16   Isaiah Serge, NP  Ascorbic Acid (VITAMIN C PO) Take 1 tablet by mouth daily.    [provider]  aspirin EC 81 MG tablet Take 1 tablet (81 mg total) by mouth daily. 05/31/16   Burnell Blanks, MD  BAYER MICROLET LANCETS lancets Check blood sugar 3 times a day as instructed 06/28/17   Kalman Shan Ratliff, DO  carvedilol (COREG) 3.125 MG tablet Take 1 tablet by mouth twice daily with a meal. Please keep upcoming appt in November before anymore refills. Thank you 11/21/17   Burnell Blanks, MD  Empagliflozin-Metformin HCl (SYNJARDY) 07-998 MG TABS Take 1 tablet by mouth 2 (two) times daily. 03/28/17   Kalman Shan Ratliff, DO  furosemide (LASIX) 40 MG tablet TAKE 1 TABLET BY MOUTH EVERY MORNING AND 2 TABLETS EVERY NIGHT 08/16/17   Burnell Blanks, MD  gabapentin (NEURONTIN) 100 MG capsule Take 1 capsule (100 mg total) by mouth 2 (two) times daily. 06/08/17   Valinda Party, DO  glucose blood (CONTOUR NEXT TEST) test strip Check blood sugar 3 times a day as instructed 02/28/17   Jule Ser, DO  ibuprofen (ADVIL,MOTRIN) 200 MG tablet Take 400 mg by mouth every 6 (six) hours as needed for headache Cabrera moderate pain.    [provider]  Insulin Glargine  (LANTUS) 100 UNIT/ML Solostar Pen Inject 40 Units into the skin daily at 10 pm. 06/28/17   Kalman Shan Ratliff, DO  insulin lispro (HUMALOG KWIKPEN) 100 UNIT/ML KiwkPen Please take 14 units with meals Patient taking differently: Inject 14 Units into the skin 3 (three) times daily with meals.  06/28/17   Kalman Shan Ratliff, DO  Insulin Pen Needle (PEN NEEDLES) 31G X 5 MM MISC 1 each by Does not apply route 3 (three) times daily. 06/28/17   Kalman Shan Ratliff, DO  nitroGLYCERIN (NITROSTAT) 0.4 MG SL tablet Place 1 tablet (0.4 mg total) under the tongue every 5 (five) minutes as needed for chest pain. 02/14/17   Jule Ser, DO  potassium chloride SA (K-DUR,KLOR-CON) 20 MEQ tablet TAKE 1 TABLET BY MOUTH ONCE DAILY 09/07/17   Burnell Blanks, MD  tamsulosin (FLOMAX) 0.4 MG CAPS capsule TAKE 1 CAPSULE BY MOUTH DAILY AFTER  SUPPER 09/07/17   Burnell Blanks, MD  valACYclovir (VALTREX) 1000 MG tablet Take 1 tablet (1,000 mg total) by mouth 3 (three) times daily for 10 days. 11/26/17 12/06/17  Robyn Haber, MD    Family History Family History  Problem Relation Age of Onset  . Hypertension Mother   . Diabetes Mother   . Alcohol abuse Father     Social History Social History   Tobacco Use  . Smoking status: Never Smoker  . Smokeless tobacco: Never Used  Substance Use Topics  . Alcohol use: Yes    Comment: occasional  . Drug use: No     Allergies   Penicillins; Statins; and Morphine and related   Review of Systems Review of Systems  Skin: Positive for rash.  Neurological: Positive for weakness.  All other systems reviewed and are negative.    Physical Exam Triage Vital Signs ED Triage Vitals [11/26/17 1617]  Enc Vitals Group     BP (!) 128/99     Pulse Rate 68     Resp 20     Temp 97.9 F (36.6 C)     Temp Source Oral     SpO2 99 %     Weight      Height      Head Circumference      Peak Flow      Pain Score      Pain Loc      Pain Edu?       Excl. in [place]?    No data found.  Updated Vital Signs BP (!) 128/99 (BP Location: Left Arm)   Pulse 68   Temp 97.9 F (36.6 C) (Oral)   Resp 20   SpO2 99%    Physical Exam  Constitutional: He appears well-developed and well-nourished.  HENT:  Head: Normocephalic.  Eyes: Pupils are equal, round, and reactive to light. Conjunctivae are normal.  Neck: Normal range of motion. Neck supple.  Pulmonary/Chest: Effort normal.  Musculoskeletal:  Antalgic gait  Neurological: He is alert.  Skin: Skin is warm and dry. Rash noted.  Dermatomal rash along the T2 dermatome on the right  Psychiatric: He has a normal mood and affect.  Nursing note and vitals reviewed.    UC Treatments / Results  Labs (all labs ordered are listed, but only abnormal results are displayed) Labs Reviewed - No data to display  EKG None  Radiology No results found.  Procedures Procedures (including critical care time)  Medications Ordered in UC Medications - No data to display  Initial Impression / Assessment and Plan / UC Course  I have reviewed the triage vital signs and the nursing notes.  Pertinent labs & imaging results that were available during my care of the patient were reviewed by me and considered in my medical decision making (see chart for details).    Final Clinical Impressions(s) / UC Diagnoses   Final diagnoses:  Herpes zoster without complication   Discharge Instructions   None    ED Prescriptions    Medication Sig Dispense Auth. Provider   valACYclovir (VALTREX) 1000 MG tablet Take 1 tablet (1,000 mg total) by mouth 3 (three) times daily for 10 days. 30 tablet Robyn Haber, MD     Controlled Substance Prescriptions [place] Controlled Substance Registry consulted? No   Robyn Haber, MD 11/26/17 1702

## 2017-11-28 ENCOUNTER — Telehealth: Payer: Self-pay | Admitting: Dietician

## 2017-11-28 DIAGNOSIS — E1142 Type 2 diabetes mellitus with diabetic polyneuropathy: Secondary | ICD-10-CM

## 2017-11-28 NOTE — Telephone Encounter (Signed)
Spoke with [name] on phone. He is taking pills for diabetes, Lantus 46 units daily, Humalog 1x every other day. Eats away from home and didn't think he should take the insulin in the hot weather. Recently diagnosed with shingles.   Checking blood sugars every other day- the lowest 174, 180, 220 and mostly in the 200s for past 2 months.  Encouraged him to carry Humalog with him to take before eating. He agreed to do this and to have front office make an appointment with the doctor (and me if needed.) He requests Humalog refills. Debera Lat, RD 11/28/2017 5:30 PM.

## 2017-11-28 NOTE — Telephone Encounter (Signed)
Mtr. Owens Shark left voixcemail about: "cannot get sugar down below 200 for last 2 months", increased lantus to 46 and not taking meal time insulin

## 2017-11-29 ENCOUNTER — Other Ambulatory Visit: Payer: Self-pay | Admitting: Internal Medicine

## 2017-11-29 MED ORDER — INSULIN LISPRO 100 UNIT/ML (KWIKPEN): PEN_INJECTOR | SUBCUTANEOUS | 3 refills | Status: DC

## 2017-11-30 NOTE — Telephone Encounter (Signed)
Per Epic, pt has an w/Dr Heber [place] on 10/3.

## 2017-12-03 ENCOUNTER — Other Ambulatory Visit: Payer: Self-pay | Admitting: Cardiovascular Disease

## 2017-12-13 ENCOUNTER — Ambulatory Visit (INDEPENDENT_AMBULATORY_CARE_PROVIDER_SITE_OTHER): Payer: Medicare Other | Admitting: Internal Medicine

## 2017-12-13 ENCOUNTER — Encounter: Payer: Self-pay | Admitting: Internal Medicine

## 2017-12-13 ENCOUNTER — Other Ambulatory Visit: Payer: Self-pay

## 2017-12-13 VITALS — BP 117/73 | HR 87 | Wt 206.8 lb

## 2017-12-13 DIAGNOSIS — E1142 Type 2 diabetes mellitus with diabetic polyneuropathy: Secondary | ICD-10-CM | POA: Diagnosis not present

## 2017-12-13 DIAGNOSIS — E1149 Type 2 diabetes mellitus with other diabetic neurological complication: Secondary | ICD-10-CM | POA: Diagnosis not present

## 2017-12-13 DIAGNOSIS — Z7982 Long term (current) use of aspirin: Secondary | ICD-10-CM

## 2017-12-13 DIAGNOSIS — Z794 Long term (current) use of insulin: Secondary | ICD-10-CM | POA: Diagnosis not present

## 2017-12-13 DIAGNOSIS — Z0000 Encounter for general adult medical examination without abnormal findings: Secondary | ICD-10-CM

## 2017-12-13 LAB — GLUCOSE, CAPILLARY: Glucose-Capillary: 224 mg/dL — ABNORMAL HIGH (ref 70–99)

## 2017-12-13 LAB — POCT GLYCOSYLATED HEMOGLOBIN (HGB A1C): Hemoglobin A1C: 8.8 % — AB (ref 4.0–5.6)

## 2017-12-13 NOTE — Progress Notes (Signed)
   CC: follow up on type II diabetes   HPI:  [name] is a 67 y.o. male with history noted below that presents to the [facility] for follow up of type II diabetes.  Please see problem based charting for the status of patient's chronic medical conditions.  Past Medical History:  Diagnosis Date  . Acute diastolic congestive heart failure ([place])   . Aortic valve stenosis s/p AVR 2018  . Atrial fibrillation ([place]) - post-op CABG    04/2016 CHA2DS2VAS score = 5  . Atypical nevi   . Coronary artery disease s/p 2 vessel CABG   . Depression    "years ago"  . Diabetes mellitus   . Dyspnea    in the past   . GERD (gastroesophageal reflux disease)   . Heart murmur   . Hepatic cirrhosis ([place]) 06/29/2017  . History of kidney stones   . Hyperlipidemia    hx of transaminitis secondary to statin and he has decided not to use statins secondary to potential side effects.  . Hypertension   . Nephrolithiasis   . Osteoarthritis, knee   . Sleep apnea     Central apnea. Not using cpap  . Stroke [facility]) 2007  . Transaminitis     Statin-induced  . Vertebral artery dissection ([place]) 2007    medullary stroke/PICA,  no significant carotid disease on Dopplers. 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 2-D echo was normal no embolic source found.    Review of Systems:  Review of Systems  Constitutional: Negative for malaise/fatigue.  Respiratory: Negative for shortness of breath.   Cardiovascular: Negative for chest pain.  Gastrointestinal: Negative for abdominal pain.  Genitourinary: Negative for frequency.     Physical Exam:  Vitals:   12/13/17 1402  BP: 117/73  Pulse: 87  SpO2: 99%  Weight: 206 lb 12.8 oz (93.8 kg)   Physical Exam  Constitutional: He is well-developed, well-nourished, and in no  distress.  Cardiovascular: Normal rate, regular rhythm and normal heart sounds. Exam reveals no gallop and no friction rub.  No murmur heard. Pulmonary/Chest: Effort normal and breath sounds normal. No respiratory distress. He has no wheezes. He has no rales.  Musculoskeletal: He exhibits no edema.     Assessment & Plan:   See encounters tab for problem based medical decision making.   Patient discussed with Dr. Evette Doffing

## 2017-12-13 NOTE — Patient Instructions (Addendum)
Mark Cabrera,  It was a pleasure seeing you today.  Please increase your lantus to 50 units and humolog to 16 units with meals.

## 2017-12-14 DIAGNOSIS — E1142 Type 2 diabetes mellitus with diabetic polyneuropathy: Secondary | ICD-10-CM | POA: Diagnosis not present

## 2017-12-17 MED ORDER — NYSTATIN 100000 UNIT/GM EX POWD: Freq: Four times a day (QID) | CUTANEOUS | 0 refills | Status: DC

## 2017-12-17 NOTE — Assessment & Plan Note (Signed)
Assessment:  Healthcare maintenance Discussed colon cancer screening with colonoscopy vs fit stool test.  Patient  accepted the fit test.  He declined pneumonia and flu vaccine  Plan - FIT stool test

## 2017-12-17 NOTE — Assessment & Plan Note (Addendum)
Assessment:  Uncontrolled type II diabetes Patient is currently taking lantus 40 qhs and humalog 14 units with meals.  He states that he doesn't always use novolog with meals and maybe will take it once in the day.  His hemoglobin A1C today is 8.8.  Sugars are elevated during lunch time and in the mornings.  Will increase lantus to 50 units and humalog 16 units. Encouraged to use novolog at least at lunch time.  Plan -increase lantus to 50 units qhs -increase humalog to 16 units at lunch time

## 2017-12-19 NOTE — Progress Notes (Signed)
Internal Medicine Clinic Attending  Case discussed with Dr. Hoffman at the time of the visit.  We reviewed the resident's history and exam and pertinent patient test results.  I agree with the assessment, diagnosis, and plan of care documented in the resident's note.  

## 2017-12-26 ENCOUNTER — Ambulatory Visit (HOSPITAL_COMMUNITY)
Admission: EM | Admit: 2017-12-26 | Discharge: 2017-12-26 | Discharge status: Absent without leave | Payer: Medicare Other

## 2017-12-26 ENCOUNTER — Other Ambulatory Visit: Payer: Medicare Other

## 2017-12-26 DIAGNOSIS — E1142 Type 2 diabetes mellitus with diabetic polyneuropathy: Secondary | ICD-10-CM

## 2017-12-29 LAB — FECAL OCCULT BLOOD, IMMUNOCHEMICAL: Fecal Occult Bld: NEGATIVE

## 2018-01-05 IMAGING — DX DG CHEST 2V
3 series · 3 of 3 positions shown · non-contrast
Comparison: Portable chest x-ray April 18, 2016

CLINICAL DATA: Shortness of breath, underwent CABG, aortic valve
replacement, and left atrial appendage clipping on March 30, 2016.

EXAM:
CHEST  2 VIEW

[x chest ap]
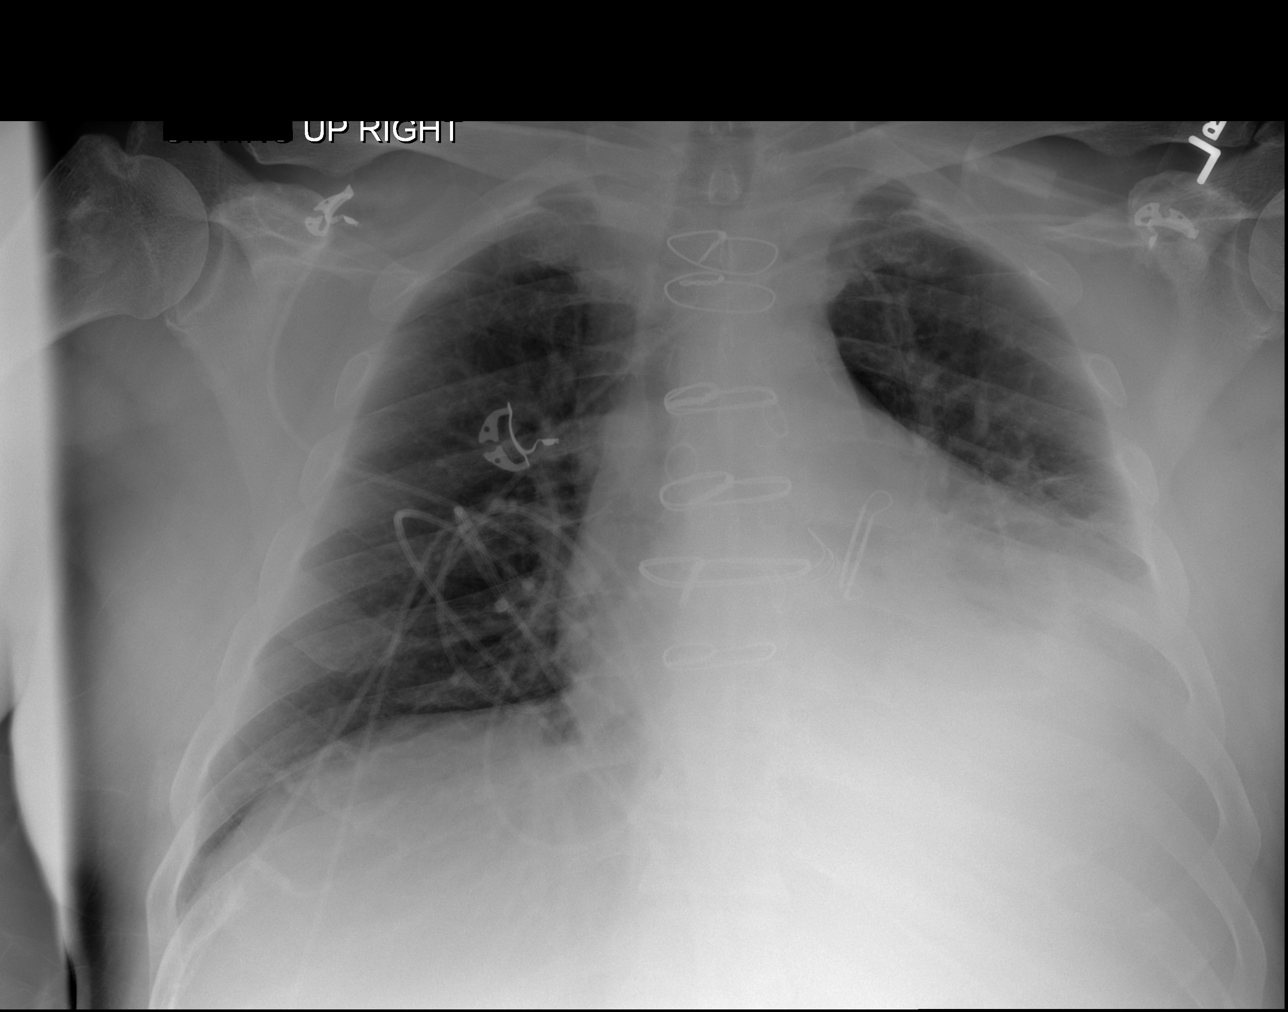

[w chest lat (1 of 2)]
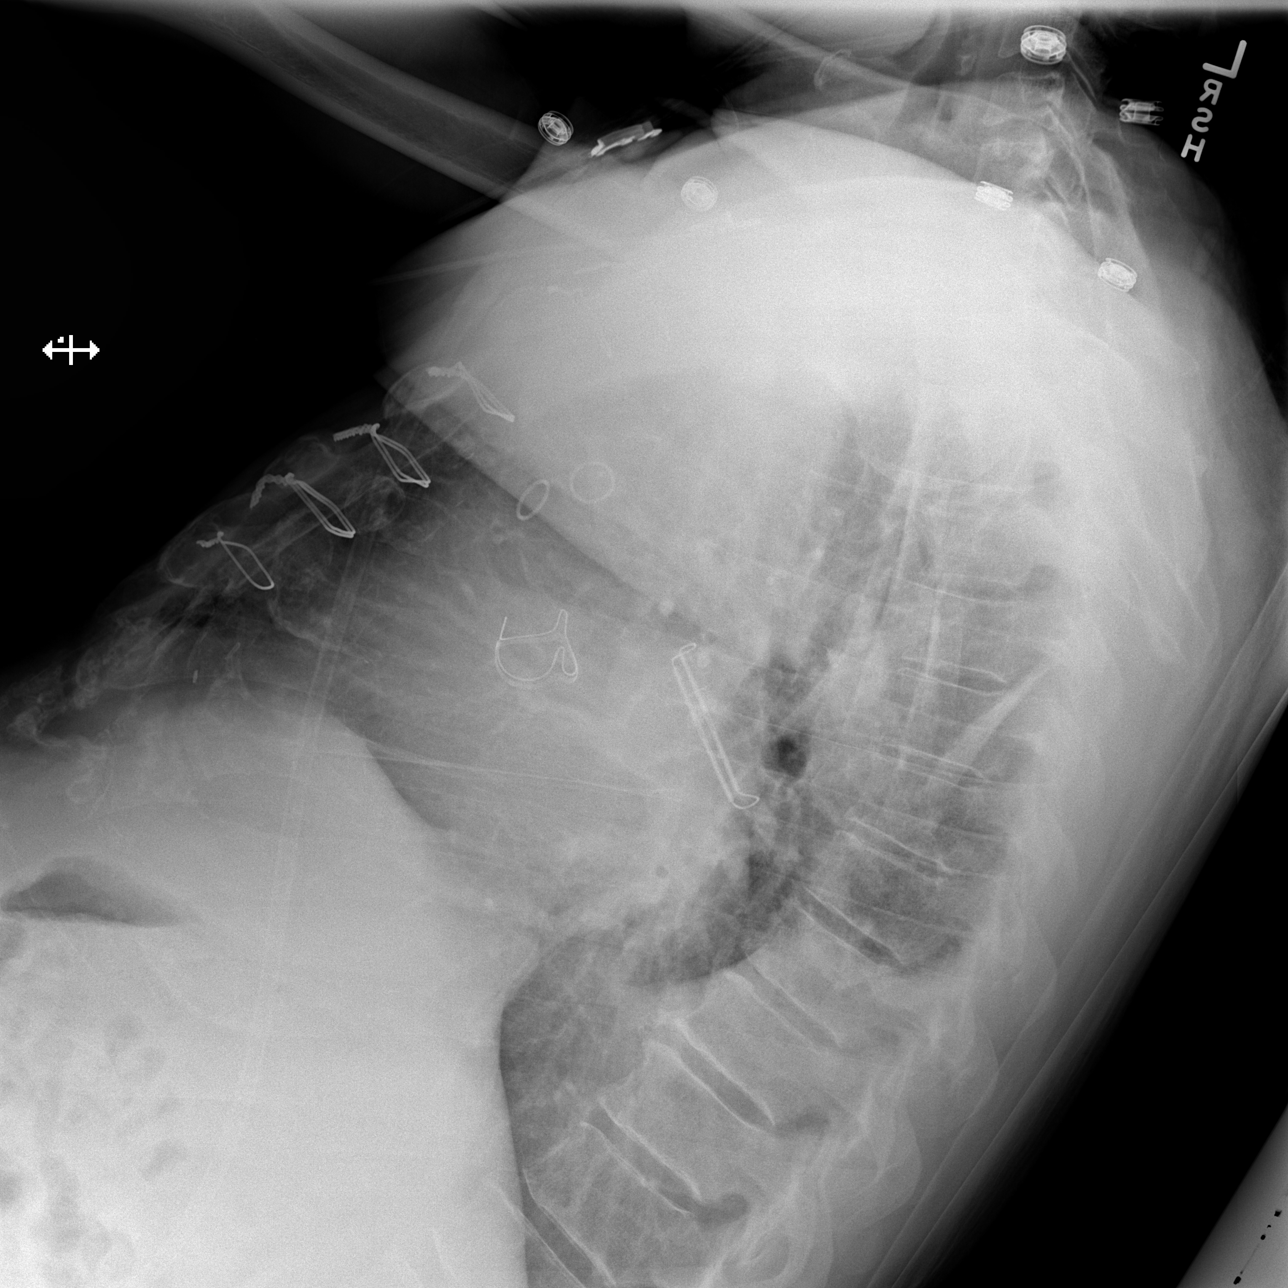

[w chest lat (2 of 2)]
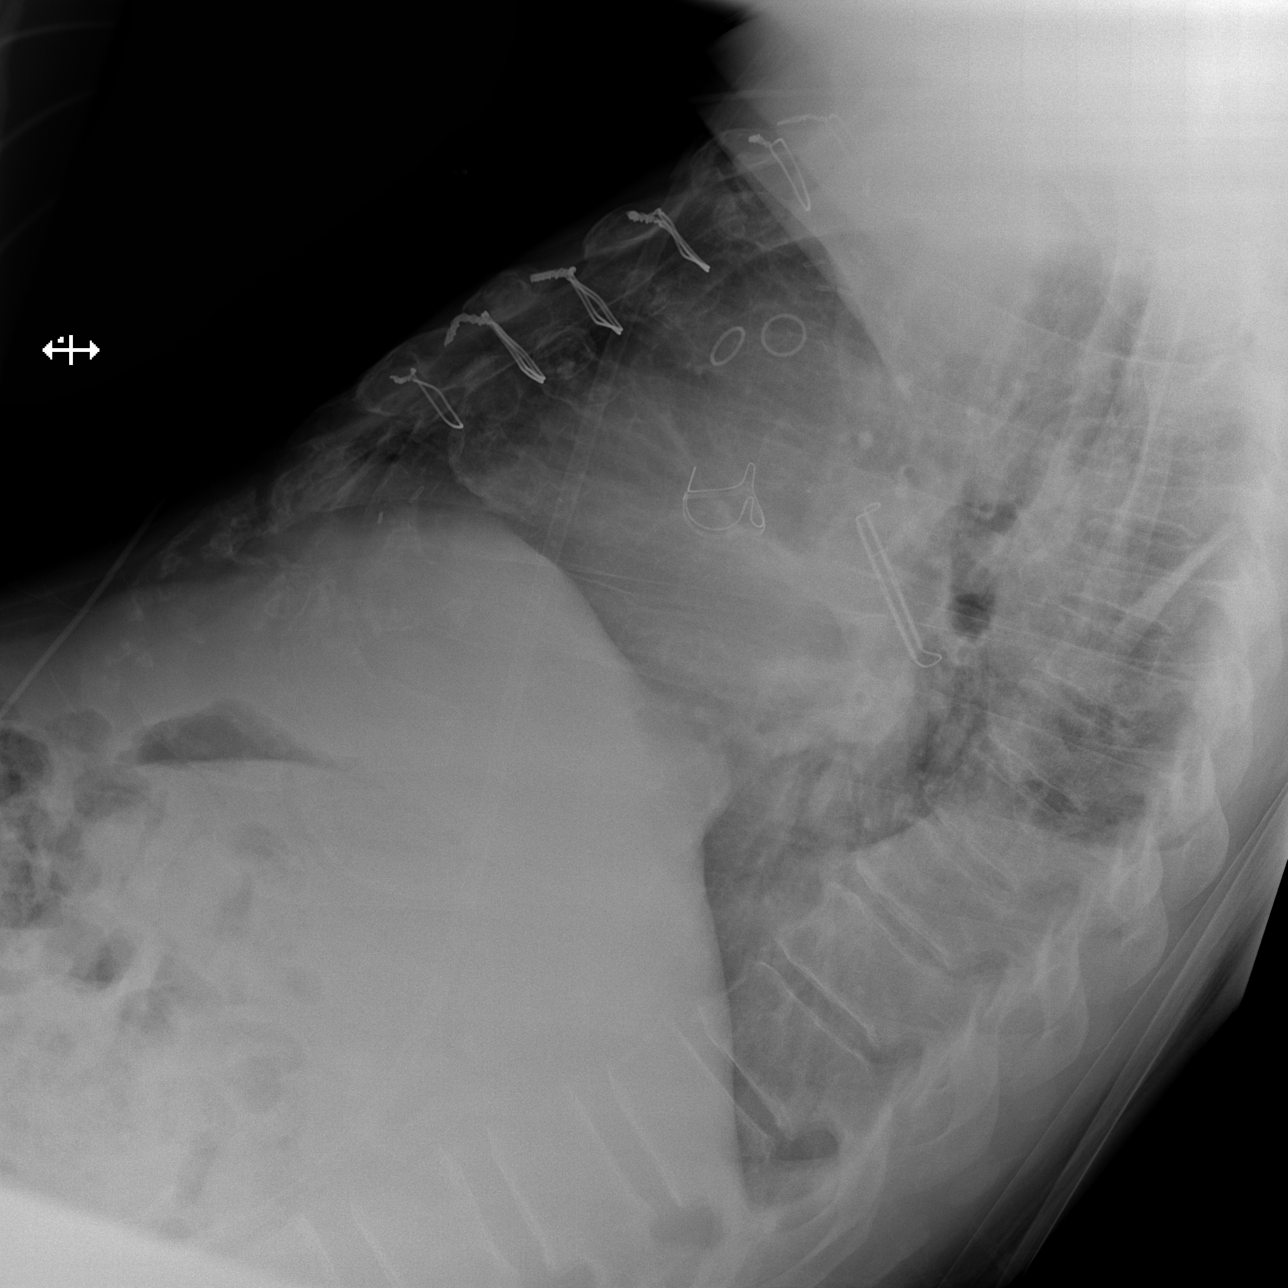

[3 of 3 positions shown; findings below may reference images not displayed]

FINDINGS: The right lung is well-expanded and clear. The left hemidiaphragm
remains obscured. There is slight improved aeration at the left lung
base. The cardiac silhouette remains enlarged. The pulmonary
vascularity is not engorged. The sternal wires are intact. The left
atrial appendage clip and the aortic valve ring appear to be in
stable position. The observed bony structures are unremarkable.
IMPRESSION: Slightly decreased volume of the left pleural effusion and left
basilar atelectasis. Stable cardiomegaly without pulmonary edema.

## 2018-01-06 IMAGING — DX DG CHEST 1V PORT
1 series · 1 of 1 positions shown · non-contrast
Comparison: 04/20/2016, 04/18/2016

CLINICAL DATA: effusion

EXAM:
PORTABLE CHEST 1 VIEW

[chest ap]
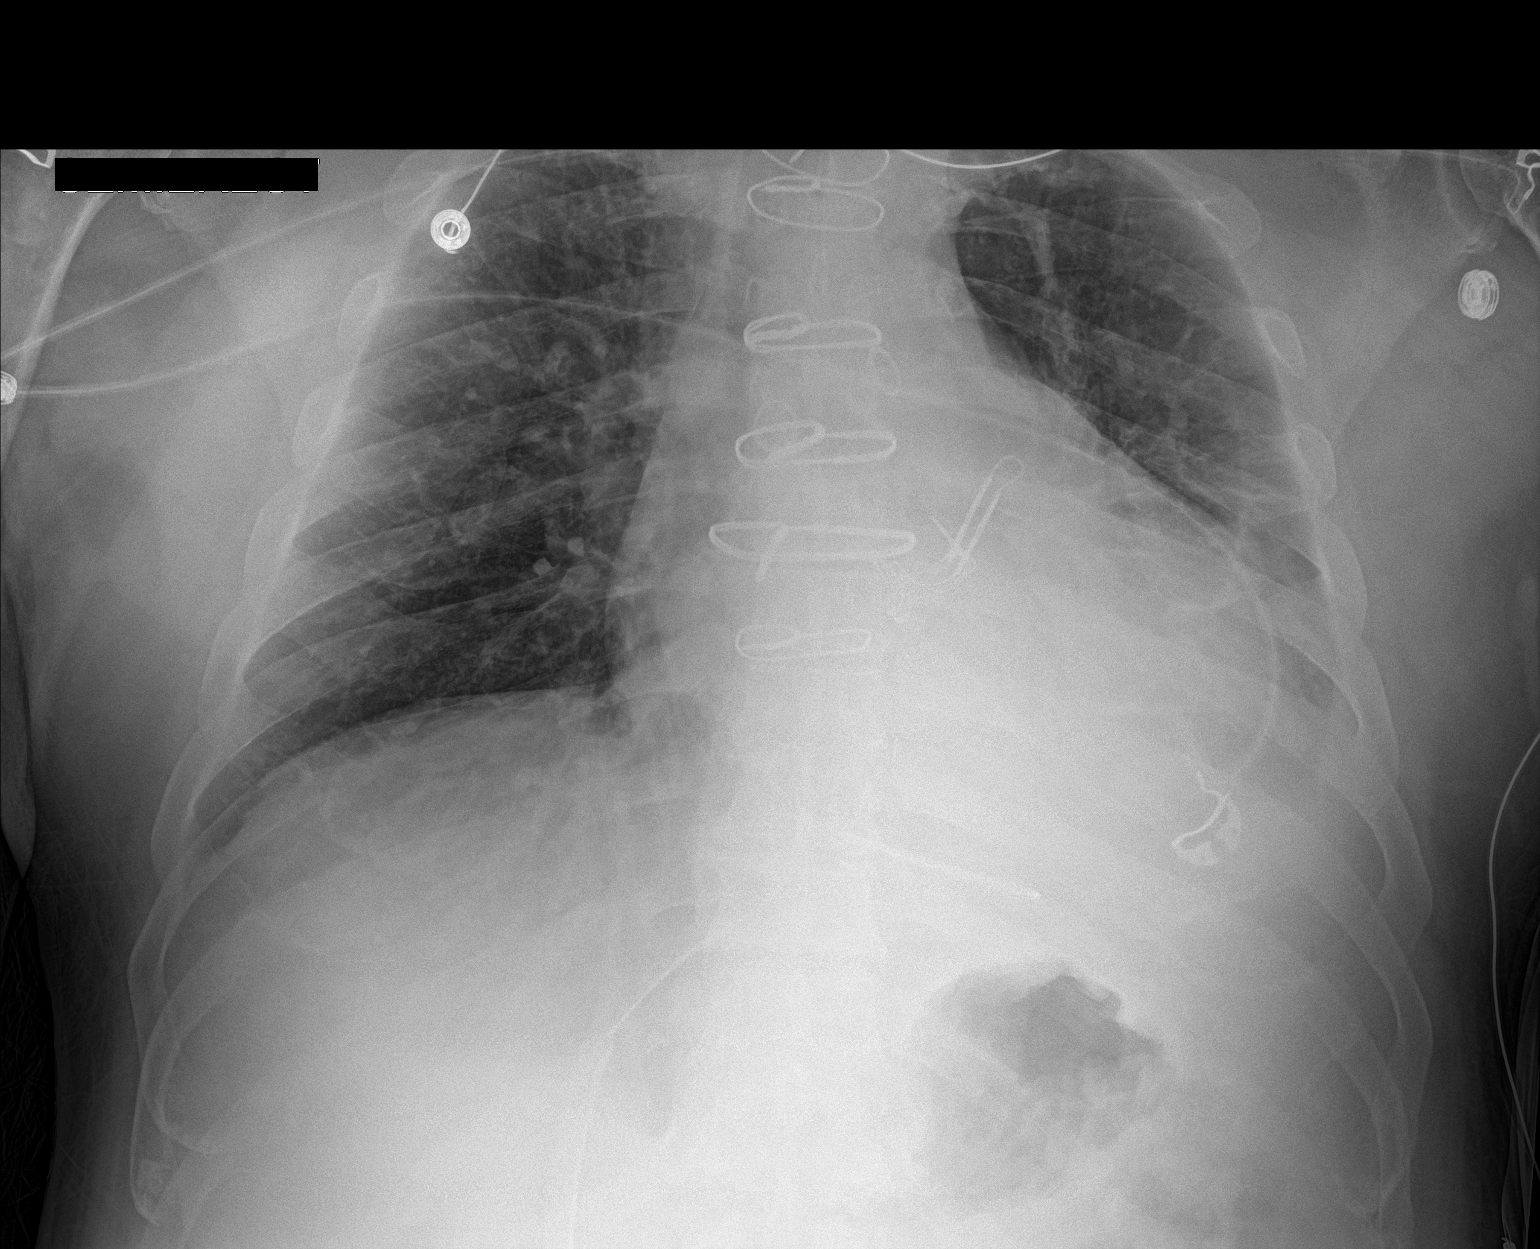

[1 of 1 positions shown; findings below may reference images not displayed]

FINDINGS: Post sternotomy changes. Possible drain or radiopaque 2 being over
the upper abdomen/ base of heart, correlate clinically.

The right lung is grossly clear. Interval decrease in left-sided
pleural effusion. Left basilar atelectasis or infiltrate. Stable
cardiomegaly with mild central congestion. No pneumothorax.
IMPRESSION: 1. Slight decrease left-sided pleural effusion with mildly improved
aeration at the left base. There is a small residual pleural
effusion with subjacent atelectasis or infiltrate
2. Stable cardiomegaly

## 2018-01-07 ENCOUNTER — Other Ambulatory Visit: Payer: Self-pay | Admitting: Cardiovascular Disease

## 2018-01-07 IMAGING — CR DG CHEST 1V PORT
1 series · 1 of 1 positions shown · non-contrast
Comparison: 04/21/2016

CLINICAL DATA: Pericardial effusion

EXAM:
PORTABLE CHEST 1 VIEW

[AP]
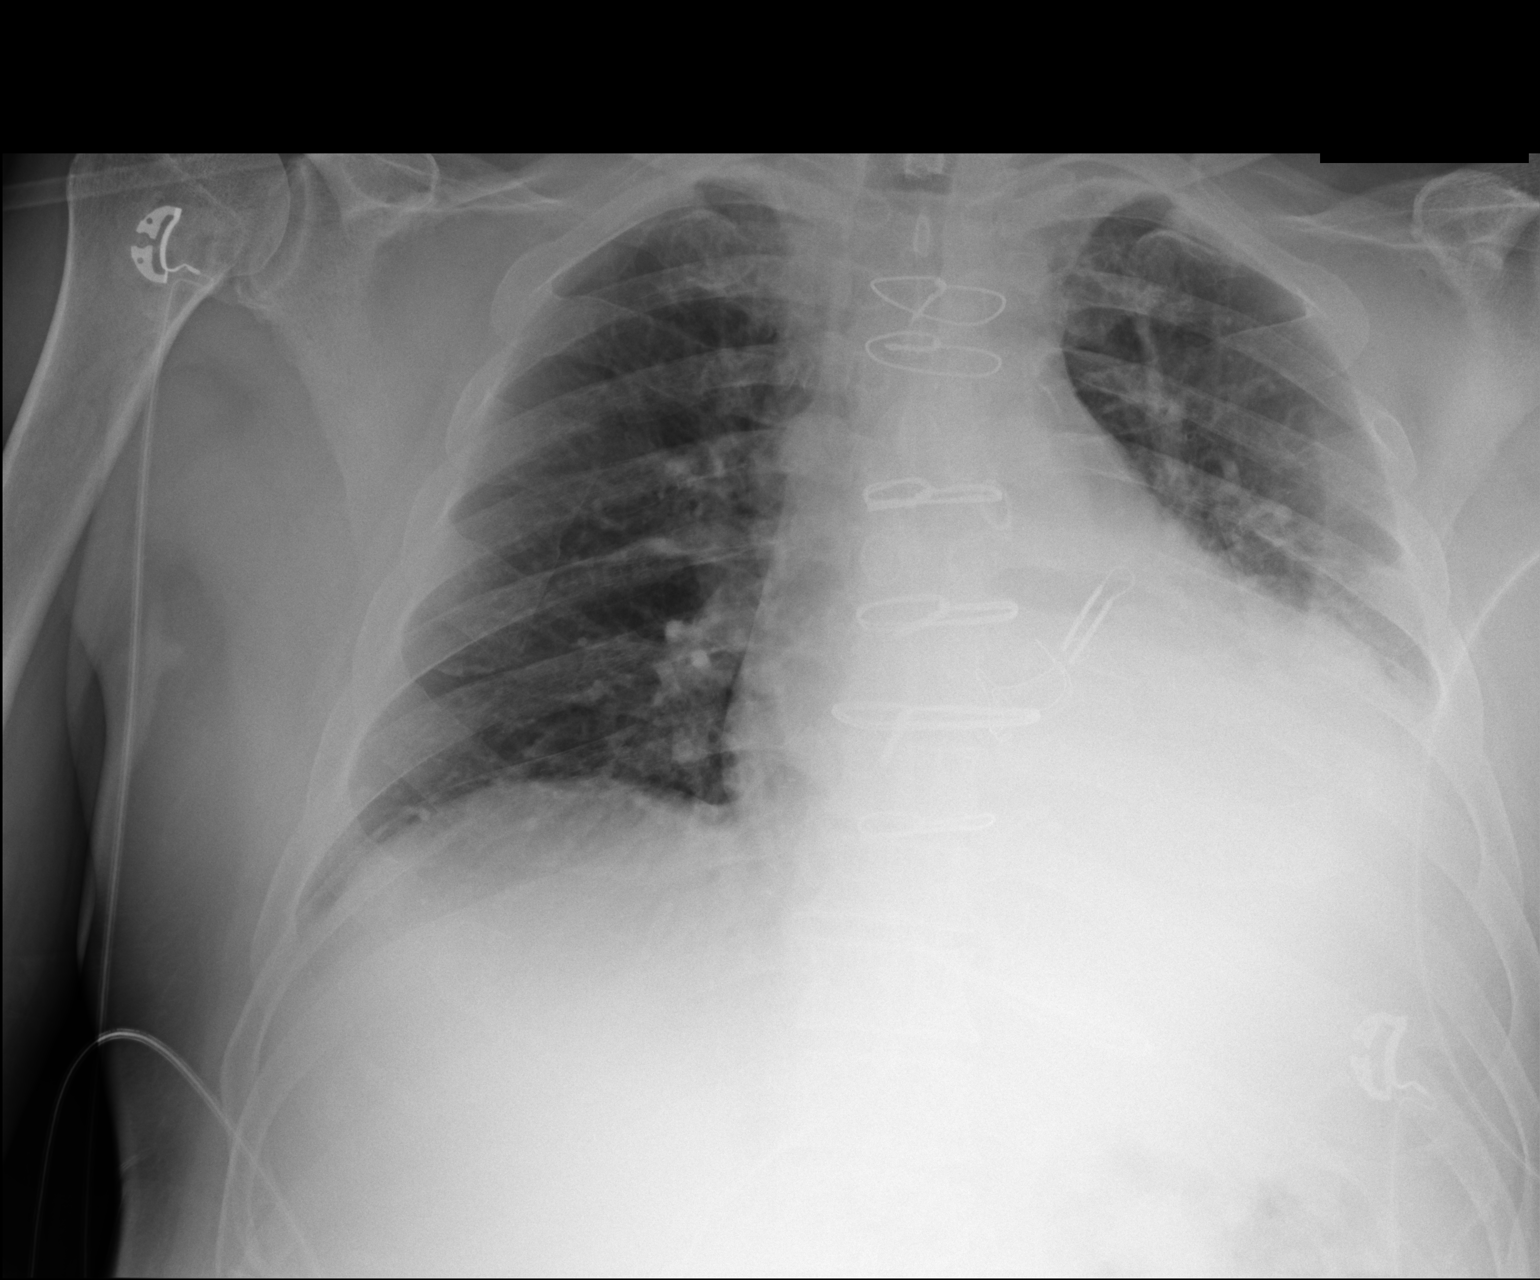

[1 of 1 positions shown; findings below may reference images not displayed]

FINDINGS: Cardiomegaly. Prior valve replacement. Left lower lobe atelectasis
or infiltrate with small left effusion. Aeration at the left base
slightly worsened since prior study. Minimal right base atelectasis.
IMPRESSION: Worsening aeration with increasing atelectasis or infiltrate at the
left base. Small left effusion.

Minimal right base atelectasis.

## 2018-01-09 ENCOUNTER — Other Ambulatory Visit: Payer: Self-pay | Admitting: Cardiovascular Disease

## 2018-01-09 IMAGING — CR DG CHEST 1V PORT
1 series · 1 of 1 positions shown · non-contrast
Comparison: 04/23/2016 .

CLINICAL DATA: Pleural effusion.

EXAM:
PORTABLE CHEST 1 VIEW

[AP]
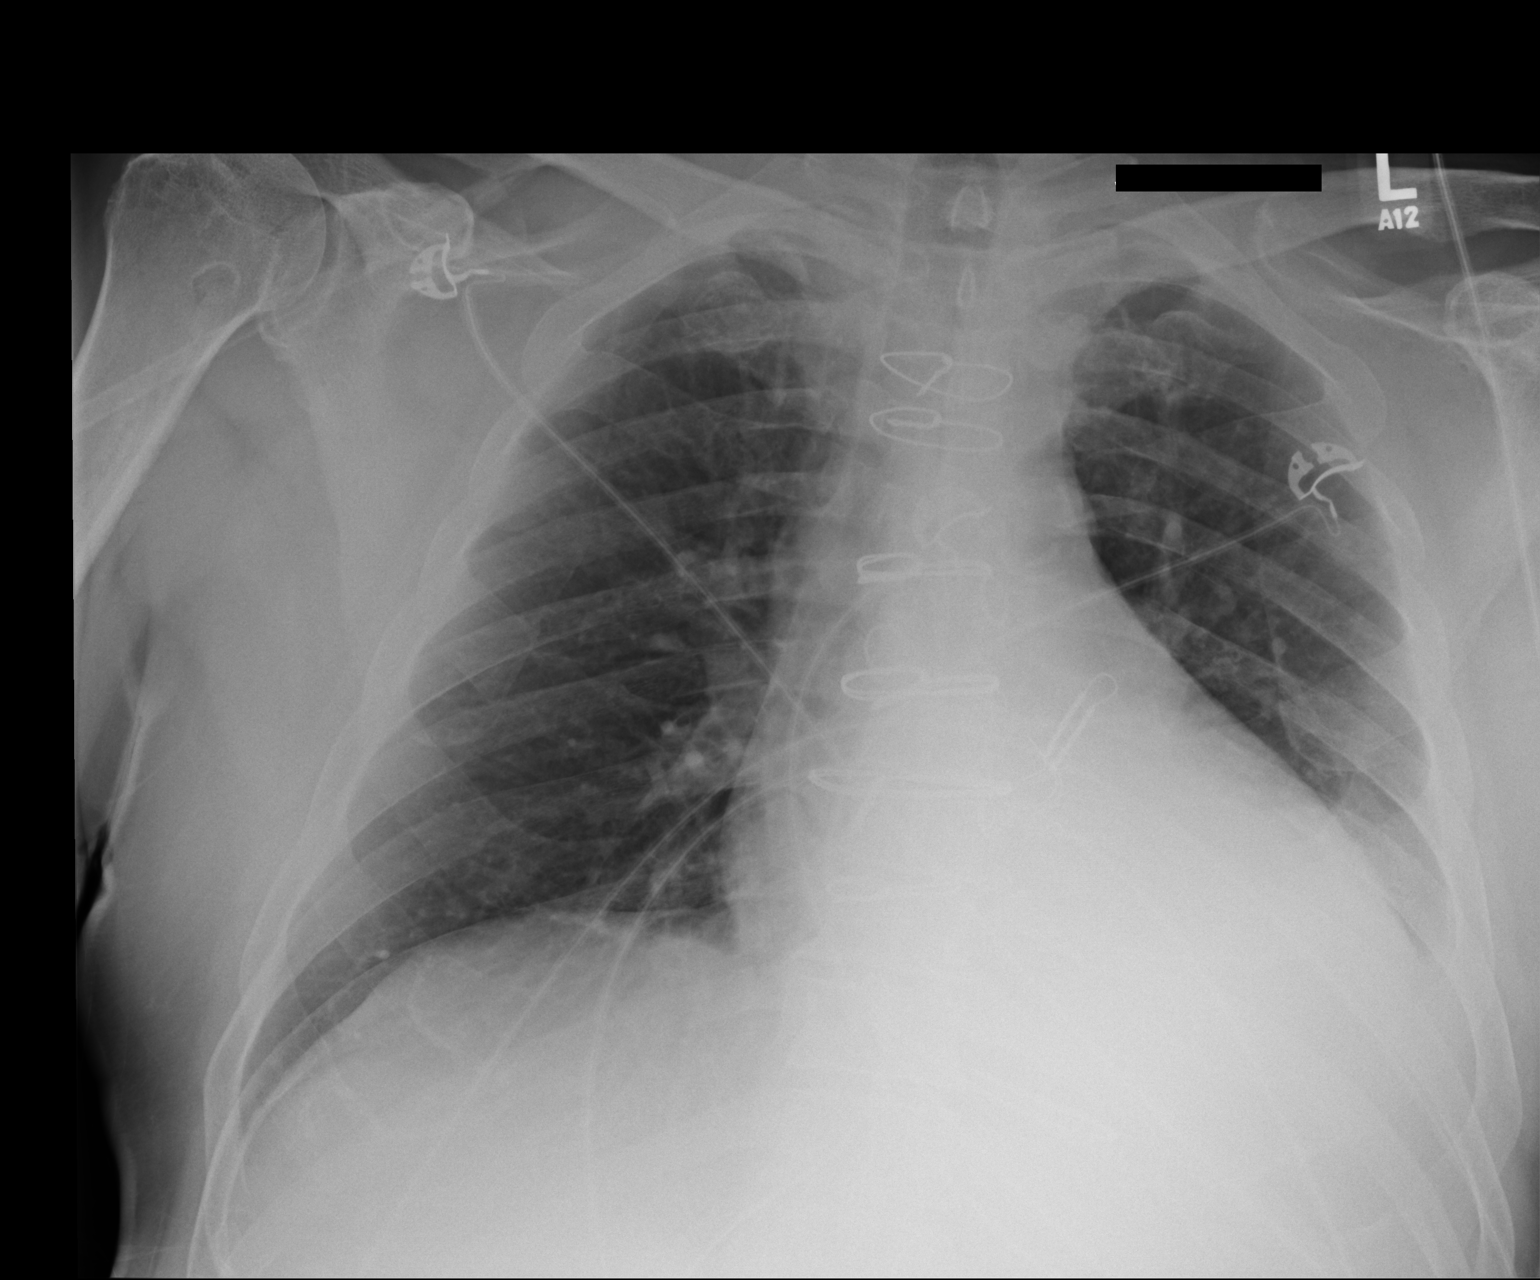

[1 of 1 positions shown; findings below may reference images not displayed]

FINDINGS: Prior CABG. Cardiomegaly with normal pulmonary vascularity. Left
lower lobe atelectasis and or infiltrate with small left pleural
effusion. No pneumothorax.
IMPRESSION: 1.  Prior CABG.  Cardiomegaly with normal pulmonary vascularity.

2. Left lower lobe atelectasis and/or infiltrate with small left
pleural effusion . No change from prior exam.

## 2018-01-17 NOTE — Progress Notes (Addendum)
Chief Complaint  Patient presents with  . Follow-up    CAD   History of Present Illness: 67 yo male with history of HTN, HLD, atrial fibrillation, DM, severe aortic stenosis now s/p AVR January 2018, CAD s/p 2V CABG January 2018, prior CVA here today for cardiac follow up. He was admitted to [facility] January 2018 and underwent surgical AVR for AS and 2V CABG (SVG to PDA and SVG to OM1) along with left atrial appendage clipping per Dr. Roxan Hockey. He has post-op atrial fib and volume overload. His post-operative course was also complicated by a large pericardial effusion and pleural effusion. He was readmitted and had thoracentesis and pericardial window. He has been on Lasix with some issues with volume overload post-op. Last echo April 2018 with LVEF=50-55%, mild LVH, AV bioprosthetic valve working well. Both atria are mildly enlarged. No pericardial effusion noted.  Ongoing chest pain since surgery. Sternal wire removal June 2019.   He is here today for follow up. The patient denies any chest pain, dyspnea, palpitations, lower extremity edema, orthopnea, PND, dizziness, near syncope or syncope. Still has pain in his chest from shingles.   Primary Care Physician: Valinda Party, DO  Past Medical History:  Diagnosis Date  . Acute diastolic congestive heart failure ([place])   . Aortic valve stenosis s/p AVR 2018  . Atrial fibrillation ([place]) - post-op CABG    04/2016 CHA2DS2VAS score = 5  . Atypical nevi   . Coronary artery disease s/p 2 vessel CABG   . Depression    "years ago"  . Diabetes mellitus   . Dyspnea    in the past   . GERD (gastroesophageal reflux disease)   . Heart murmur   . Hepatic cirrhosis ([place]) 06/29/2017  . History of kidney stones   . Hyperlipidemia    hx of transaminitis secondary to statin and he has decided not to use statins secondary to potential side effects.  . Hypertension   . Nephrolithiasis   . Osteoarthritis, knee   . Sleep apnea     Central apnea.  Not using cpap  . Stroke [facility]) 2007  . Transaminitis     Statin-induced  . 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 2-D echo was normal no embolic source found.    Past Surgical History:  Procedure Laterality Date  . AORTIC VALVE REPLACEMENT N/A 03/30/2016   Procedure: AORTIC VALVE REPLACEMENT (AVR);  Surgeon: [name], MD;  Location: [place];  Service: Open Heart Surgery;  Laterality: N/A;  . CARDIAC CATHETERIZATION N/A 03/15/2016   Procedure: Right/Left Heart Cath and Coronary Angiography;  Surgeon: Burnell Blanks, MD;  Location: [place] CV LAB;  Service: Cardiovascular;  Laterality: N/A;  . CLIPPING OF ATRIAL APPENDAGE N/A 03/30/2016   Procedure: CLIPPING OF LEFT ATRIAL APPENDAGE;  Surgeon: [name], MD;  Location: [place];  Service: Open Heart Surgery;  Laterality: N/A;  . CORONARY ARTERY BYPASS GRAFT N/A 03/30/2016   Procedure: CORONARY ARTERY BYPASS GRAFTING (CABG) Times Two;  Surgeon: [name], MD;  Location: [place];  Service: Open Heart Surgery;  Laterality: N/A;  . KNEE ARTHROSCOPY W/ PARTIAL MEDIAL MENISCECTOMY  05/12/2005   right, performed by Dr. French Ana for torn medial meniscus.  Marland Kitchen  ROTATOR CUFF REPAIR Right 2016  . STERNAL WIRES REMOVAL N/A 08/13/2017   Procedure: STERNAL WIRES REMOVAL;  Surgeon: [name], MD;  Location: [place];  Service: Thoracic;  Laterality: N/A;  . SUBXYPHOID PERICARDIAL WINDOW N/A 04/21/2016   Procedure: SUBXYPHOID PERICARDIAL WINDOW;  Surgeon: [name], MD;  Location: [place];  Service: Thoracic;  Laterality: N/A;  . TEE WITHOUT CARDIOVERSION N/A 03/30/2016   Procedure: TRANSESOPHAGEAL ECHOCARDIOGRAM (TEE);  Surgeon: [name], MD;  Location: [place];  Service: Open Heart Surgery;  Laterality: N/A;  . TEE WITHOUT CARDIOVERSION N/A 04/21/2016   Procedure: TRANSESOPHAGEAL ECHOCARDIOGRAM (TEE);  Surgeon: [name], MD;  Location: [facility] OR;  Service: Thoracic;  Laterality: N/A;    Current Outpatient Medications  Medication Sig Dispense Refill  . acetaminophen (TYLENOL) 325 MG tablet Take 2 tablets (650 mg total) by mouth every 4 (four) hours as needed for headache or mild pain.    . Ascorbic Acid (VITAMIN C PO) Take 1 tablet by mouth daily.    Marland Kitchen aspirin EC 81 MG tablet Take 1 tablet (81 mg total) by mouth daily. 90 tablet 3  . BAYER MICROLET LANCETS lancets Check blood sugar 3 times a day as instructed 100 each 12  . carvedilol (COREG) 3.125 MG tablet TAKE 1 TABLET BY MOUTH TWICE DAILY WITH A MEAL 60 tablet 0  . furosemide (LASIX) 40 MG tablet TAKE 1 TABLET BY MOUTH ONCE DAILY IN THE MORNING AND TAKE 2 TABLETS EVERY NIGHT. Please keep upcoming appt in November. Thank you 270 tablet 0  . gabapentin (NEURONTIN) 100 MG capsule Take 1 capsule (100 mg total) by mouth 2 (two) times daily. 180 capsule 2  . glucose blood (CONTOUR NEXT TEST) test strip Check blood sugar 3 times a day as instructed 100 each 12  . ibuprofen (ADVIL,MOTRIN) 200 MG tablet Take 400 mg by mouth every 6 (six) hours as needed for headache or moderate pain.    Marland Kitchen insulin glargine (LANTUS) 100 UNIT/ML injection Inject 50 Units into the skin daily.    . insulin lispro (HUMALOG) 100 UNIT/ML cartridge Inject 16 Units into the skin 3 (three) times daily with meals.    . Insulin Pen Needle (PEN NEEDLES) 31G X 5 MM MISC 1 each by Does not apply route 3 (three) times daily. 100 each 12  . nitroGLYCERIN (NITROSTAT) 0.4 MG SL tablet Place 1 tablet (0.4 mg total) under the tongue every 5 (five) minutes as needed for chest pain. 25 tablet 1  . potassium chloride SA (K-DUR,KLOR-CON) 20 MEQ tablet TAKE 1 TABLET BY MOUTH ONCE DAILY 90 tablet 3  .  rosuvastatin (CRESTOR) 10 MG tablet Take 1 tablet (10 mg total) by mouth daily. 90 tablet 3   No current facility-administered medications for this visit.     Allergies  Allergen Reactions  . Penicillins Other (See Comments)    UNSPECIFIED REACTIONS  Has patient had a PCN reaction causing immediate rash, facial/tongue/throat swelling, SOB or lightheadedness with hypotension: Unk Has patient had a PCN reaction causing severe rash involving mucus membranes or skin necrosis: Unk Has patient had a PCN reaction that required hospitalization: Unk Has patient had a PCN reaction occurring within the last 10 years: No If all of the above answers are "NO", then may proceed with Cephalosporin use.   . Statins Other (See Comments)    UNSPECIFIED REACTION   . Morphine And Related Other (See Comments)    hallucinations    Social History  Socioeconomic History  . Marital status: Divorced    Spouse name: Not on file  . Number of children: 1  . Years of education: 2y college  . Highest education level: Not on file  Occupational History  . Occupation:  Medicaid [place]    Employer: UNEMPLOYED    Comment: following stroke in 2007  Social Needs  . Financial resource strain: Not on file  . Food insecurity:    Worry: Not on file    Inability: Not on file  . Transportation needs:    Medical: Not on file    Non-medical: Not on file  Tobacco Use  . Smoking status: Never Smoker  . Smokeless tobacco: Never Used  Substance and Sexual Activity  . Alcohol use: Yes    Comment: occasional  . Drug use: No  . Sexual activity: Not on file  Lifestyle  . Physical activity:    Days per week: Not on file    Minutes per session: Not on file  . Stress: Not on file  Relationships  . Social connections:    Talks on phone: Not on file    Gets together: Not on file    Attends religious service: Not on file    Active member of club or organization: Not on file    Attends meetings of clubs or  organizations: Not on file    Relationship status: Not on file  . Intimate partner violence:    Fear of current or ex partner: Not on file    Emotionally abused: Not on file    Physically abused: Not on file    Forced sexual activity: Not on file  Other Topics Concern  . Not on file  Social History Narrative   Single but has a girlfriend.    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       Patient has medications disability post stroke.             Family History  Problem Relation Age of Onset  . Hypertension Mother   . Diabetes Mother   . Alcohol abuse Father     Review of Systems:  As stated in the HPI and otherwise negative.   BP 120/80   Pulse 67   Ht 5\' 8"  (1.727 m)   Wt 217 lb 12.8 oz (98.8 kg)   SpO2 99%   BMI 33.12 kg/m   Physical Examination:  General: Well developed, well nourished, NAD  HEENT: OP clear, mucus membranes moist  SKIN: warm, dry. No rashes. Neuro: No focal deficits  Musculoskeletal: Muscle strength 5/5 all ext  Psychiatric: Mood and affect normal  Neck: No JVD, no carotid bruits, no thyromegaly, no lymphadenopathy.  Lungs:Clear bilaterally, no wheezes, rhonci, crackles Cardiovascular: Regular rate and rhythm. No murmurs, gallops or rubs. Abdomen:Soft. Bowel sounds present. Non-tender.  Extremities: No lower extremity edema. Pulses are 2 + in the bilateral DP/PT.  Echo April 2018: - Left ventricle: The cavity size was normal. Wall thickness was   increased in a pattern of mild LVH. Systolic function was normal.   The estimated ejection fraction was in the range of 50% to 55%.   Wall motion was normal; there were no regional wall motion   abnormalities. Features are consistent with a pseudonormal left   ventricular filling pattern, with concomitant abnormal relaxation   and increased filling pressure (grade 2 diastolic dysfunction). - Aortic valve: A bioprosthesis was present. Valve area (VTI): 2.44  cm^2. Valve area (Vmax): 2.51 cm^2. Valve area (Vmean): 2.14   cm^2. - Left atrium: The atrium was mildly dilated. - Right atrium: The atrium was mildly dilated.  EKG:  EKG is not ordered today. The ekg ordered today demonstrates   Recent Labs: 08/10/2017: ALT 35; BUN 12; Creatinine, Ser 1.17; Hemoglobin 13.5; Platelets 183; Potassium 3.8; Sodium 138   Lipid Panel    Component Value Date/Time   CHOL 286 (H) 09/07/2016 1430   TRIG 513 (H) 09/07/2016 1430   HDL 41 09/07/2016 1430   CHOLHDL 7.0 (H) 09/07/2016 1430   CHOLHDL 4.4 04/25/2013 1535   VLDL 48 (H) 04/25/2013 1535   LDLCALC Comment 09/07/2016 1430     Wt Readings from Last 3 Encounters:  01/18/18 217 lb 12.8 oz (98.8 kg)  12/13/17 206 lb 12.8 oz (93.8 kg)  08/28/17 218 lb (98.9 kg)     Other studies Reviewed: Additional studies/ records that were reviewed today include: . Review of the above records demonstrates:   Assessment and Plan:   1. CAD without angina: No chest pain. He is s/p 2 vessel CABG in January 2018.  Continue ASA and beta blocker. He has refused to take statins in the past.     2. Pericardial effusion: Resolved s/p pericardial window.   3. Aortic stenosis: s/p surgical AVR with bioprosthetic AVR. His bioprosthetic valve is working well by echo in April 2018.  4. Atrial fib, paroxysmal: Sinus today. Continue beta blocker. He is on longer on coumadin as his atrial fib occurred post operatively and has not recurred.    5. HTN: BP is well controlled. No changes today.   6. Hyperlipidemia: He is intolerant to statins but does not recall why. Will check lipids and LFTs today. Will start Crestor 10 mg. Recheck lipids and LFTs in 12 weeks.      7. Chronic diastolic CHF: Volume status is ok. Weight is up due to poor diet per pt. Continue Lasix.    Current medicines are reviewed at length with the patient today.  The patient does not have concerns regarding medicines.  The following changes have been  made:  no change  Labs/ tests ordered today include:   Orders Placed This Encounter  Procedures  . Lipid Profile  . Hepatic function panel  . Lipid Profile  . Hepatic function panel   Disposition:   FU with me in 12 months   Signed, Lauree Chandler, MD 01/18/2018 1:45 PM    [facility] [address], [place], [place]  [address] Phone: [phone]; Fax: [phone]

## 2018-01-18 ENCOUNTER — Encounter: Payer: Self-pay | Admitting: Cardiovascular Disease

## 2018-01-18 ENCOUNTER — Ambulatory Visit (INDEPENDENT_AMBULATORY_CARE_PROVIDER_SITE_OTHER): Payer: Medicare Other | Admitting: Cardiovascular Disease

## 2018-01-18 VITALS — BP 120/80 | HR 67 | Ht 68.0 in | Wt 217.8 lb

## 2018-01-18 DIAGNOSIS — I350 Nonrheumatic aortic (valve) stenosis: Secondary | ICD-10-CM

## 2018-01-18 DIAGNOSIS — I2510 Atherosclerotic heart disease of native coronary artery without angina pectoris: Secondary | ICD-10-CM

## 2018-01-18 DIAGNOSIS — I5032 Chronic diastolic (congestive) heart failure: Secondary | ICD-10-CM | POA: Diagnosis not present

## 2018-01-18 LAB — HEPATIC FUNCTION PANEL
ALK PHOS: 65 IU/L (ref 39–117)
ALT: 30 IU/L (ref 0–44)
AST: 28 IU/L (ref 0–40)
Albumin: 5.1 g/dL — ABNORMAL HIGH (ref 3.6–4.8)
BILIRUBIN, DIRECT: 0.16 mg/dL (ref 0.00–0.40)
Bilirubin Total: 0.6 mg/dL (ref 0.0–1.2)
TOTAL PROTEIN: 7.5 g/dL (ref 6.0–8.5)

## 2018-01-18 LAB — LIPID PANEL
Chol/HDL Ratio: 6.2 ratio — ABNORMAL HIGH (ref 0.0–5.0)
Cholesterol, Total: 323 mg/dL — ABNORMAL HIGH (ref 100–199)
HDL: 52 mg/dL (ref >39)
LDL Calculated: 223 mg/dL — ABNORMAL HIGH (ref 0–99)
Triglycerides: 242 mg/dL — ABNORMAL HIGH (ref 0–149)
VLDL Cholesterol Cal: 48 mg/dL — ABNORMAL HIGH (ref 5–40)

## 2018-01-18 MED ORDER — ROSUVASTATIN CALCIUM 10 MG PO TABS: 10.0000 mg | ORAL_TABLET | Freq: Every day | ORAL | 3 refills | Status: DC

## 2018-01-18 NOTE — Patient Instructions (Addendum)
Medication Instructions:  Your physician has recommended you make the following change in your medication: Start Rosuvastatin 10 mg by mouth daily.   If you need a refill on your cardiac medications before your next appointment, please call your pharmacy.   Lab work: Lab work to be done today--Lipid and liver profiles  Your physician recommends that you return for lab work in: about 12 weeks.  Scheduled for January 27,2020   If you have labs (blood work) drawn today and your tests are completely normal, you will receive your results only by: Marland Kitchen MyChart Message (if you have MyChart) OR . A paper copy in the mail If you have any lab test that is abnormal or we need to change your treatment, we will call you to review the results.  Testing/Procedures: none  Follow-Up: At [facility], you and your health needs are our priority.  As part of our continuing mission to provide you with exceptional heart care, we have created designated Provider Care Teams.  These Care Teams include your primary Cardiologist (physician) and Advanced Practice Providers (APPs -  Physician Assistants and Nurse Practitioners) who all work together to provide you with the care you need, when you need it. You will need a follow up appointment in 12 months.  Please call our office 2 months in advance to schedule this appointment.  You may see Lauree Chandler, MD or one of the following Advanced Practice Providers on your designated Care Team:   Sanders, PA-C Melina Copa, PA-C . Ermalinda Barrios, PA-C  Any Other Special Instructions Will Be Listed Below (If Applicable).

## 2018-02-11 ENCOUNTER — Other Ambulatory Visit: Payer: Self-pay | Admitting: Internal Medicine

## 2018-02-11 ENCOUNTER — Other Ambulatory Visit: Payer: Self-pay | Admitting: Cardiovascular Disease

## 2018-02-18 ENCOUNTER — Other Ambulatory Visit: Payer: Self-pay | Admitting: Cardiovascular Disease

## 2018-02-18 DIAGNOSIS — E119 Type 2 diabetes mellitus without complications: Secondary | ICD-10-CM | POA: Diagnosis not present

## 2018-02-18 DIAGNOSIS — H2513 Age-related nuclear cataract, bilateral: Secondary | ICD-10-CM | POA: Diagnosis not present

## 2018-02-21 ENCOUNTER — Other Ambulatory Visit: Payer: Self-pay | Admitting: Internal Medicine

## 2018-02-21 ENCOUNTER — Telehealth: Payer: Self-pay

## 2018-02-21 MED ORDER — GABAPENTIN 100 MG PO CAPS: 100.0000 mg | ORAL_CAPSULE | Freq: Three times a day (TID) | ORAL | 2 refills | Status: DC

## 2018-02-21 NOTE — Telephone Encounter (Signed)
RX Clarification: Fax received from pt's pharmacy, pt states he is now taking the gabapentin TID, not BID.  If so, please send new RX with TID dosing. Thank you, SChaplin, RN,BSN

## 2018-02-21 NOTE — Telephone Encounter (Signed)
Sent. thanks

## 2018-03-04 ENCOUNTER — Other Ambulatory Visit: Payer: Self-pay | Admitting: Cardiovascular Disease

## 2018-03-25 ENCOUNTER — Telehealth: Payer: Self-pay | Admitting: Dietician

## 2018-03-25 NOTE — Telephone Encounter (Signed)
Called Mark Cabrera about cgm and doctor appointment: He agrees to CGM and doctor follow up I think that's a good idea because my blood sugars are still screwed up".  He needs to find out about his transportation for next week and asked we call back tomorrow afternoon.

## 2018-03-27 NOTE — Telephone Encounter (Signed)
appointment scheduled per Mr. Dambach for tomorrow at 8:15 AM.

## 2018-03-28 ENCOUNTER — Encounter: Payer: Self-pay | Admitting: Dietician

## 2018-03-28 ENCOUNTER — Ambulatory Visit (INDEPENDENT_AMBULATORY_CARE_PROVIDER_SITE_OTHER): Payer: Medicare Other | Admitting: Dietician

## 2018-03-28 DIAGNOSIS — E1142 Type 2 diabetes mellitus with diabetic polyneuropathy: Secondary | ICD-10-CM | POA: Diagnosis not present

## 2018-03-28 LAB — GLUCOSE, CAPILLARY: GLUCOSE-CAPILLARY: 221 mg/dL — AB (ref 70–99)

## 2018-03-28 LAB — POCT GLYCOSYLATED HEMOGLOBIN (HGB A1C): Hemoglobin A1C: 7.6 % — AB (ref 4.0–5.6)

## 2018-03-28 NOTE — Progress Notes (Signed)
This patient's blood sugars and care were discussed with Dr. Dareen Piano who approved decreasing his meal time insulin to 10 units.I was present at all times during his visit and supervised the writing of this note.   Debera Lat, RD 03/28/2018 4:40 PM.

## 2018-03-28 NOTE — Patient Instructions (Addendum)
Please LOWER YOUR HUMALOG to 10 units BEFORE Meals   Do not have a CT or an MRI while wearing the CGM.     You will also return in 2 weeks to have your second download and the CGM remove  [name] [phone]

## 2018-03-28 NOTE — Progress Notes (Signed)
Continuous glucose monitoring  Documentation for Freestyle Libre Pro Continuous glucose monitoring  Freestyle Libre Pro CGM sensor placed today. Patient was educated about wearing sensor, keeping food, activity and medication log and when to call office. Patient was educated about how to care for the sensor and not to have an MRI, CT or Diathermy while wearing the sensor. Follow up was arranged with the patient for 1 week.   Lot #: [id number] A Serial #: [id number] Expiration Date: 2018-08-11  Medical Nutrition Therapy:    Appt start time: 0815  End time:  0910 Visit # 1  Assessment:  Previous nutritional diagnosis of altered nutrition-related labs related to elevated blood sugars from excess carb intake, insufficient medication and nonadherence resolved as evidence by improved HbA1c levels. Patient has made improvements in diet and physical activity, and reports adherence to Lantus. Patient did not want to weigh prior to his appointment. Patient brought in his meter to be downloaded and reports show 2 readings within the past 30 days.   Primary concerns today: The patient's primary concerns were insulin injections and blood sugars. The patient reports some pain and hard lumps at the injection site. Patient also reports injecting his Humalog about once a week when he can remember. Patient also reports feeling dizzy with blurry vision, and will have cataract surgery soon. No retinopathy according to most recent eye doctor exam on file.   Preferred Learning Style: No preference indicated   Learning Readiness: Change in progress  ANTHROPOMETRICS:  Weight N/A, height 5\' 8" , BMI 33.12 on 01/18/2018 WEIGHT HISTORY: Highest: 250# Lowest: 190# SLEEP:N/A  MEDICATIONS: 50 units Lantis daily, 16 units of Humalog 3 times a day with meals,   BLOOD SUGAR: Lab today for glucose was 221, his reading he took himself this morning at home was 402 (may have occurred due to outdated strips and/or had fruit on  his hands). The two meter readings were 171 and 402, with an average of 287. These readings are not a good indicator of glucose control and was asked to get an A1C done at the lab. Last 5 readings reported here:   Lab Results  Component Value Date   HGBA1C 7.6 (A) 03/28/2018   HGBA1C 8.8 (A) 12/13/2017   HGBA1C 8.3 (H) 08/10/2017   HGBA1C 9.9 06/28/2017   HGBA1C 10.5 03/28/2017    DIETARY INTAKE: Patient reports eating dry cherries this morning. Usual eating patterns not assessed during this visit.  Physical activity: Moderate (walking, engaging in mildly strenuous activity at home)  Progress Towards Goal(s):  In progress.   Nutritional Diagnosis:  NB-1.1 Food and nutrition-related knowledge deficit As related to improper insulin injection and usage.  As evidenced by patient report of painful injections, injecting into one spot and taking Humalog insulin only once a week.   Intervention:  Educated patient on properly injecting insulin into abdomen; insulin storage,  usage, and disposal; rotating injection sites every day.  Outcome goal: Patient will return in one week and discuss progress made with insulin usage.   Coordination of care: Coordinated care with Mr. Affeldt physician concerning insulin dosing, since patient takes Humalog only once a week. Since patient reports some dizziness and not being able to focus eyes well there is some concern for starting him back on 16 units 3 times a day. Physician agrees to have patient take 10 units 3 times a day.   Teaching Method Utilized: Visual, Auditory Handouts given during visit include: Insulin site rotation, Continuous glucose monitoring  Barriers to learning/adherence  to lifestyle change: Competing values, financial, transportation Demonstrated degree of understanding via: Repeat  Monitoring/Evaluation:  Would like to obtain a current body weight at the next appointment if patient agrees in 1 week(s). Will download first week's  Continuous glucose monitoring readings and give feedback and additional support to patient.  Earnestine Leys 03/28/2018 1:49 PM.

## 2018-04-04 ENCOUNTER — Telehealth: Payer: Self-pay | Admitting: Internal Medicine

## 2018-04-04 ENCOUNTER — Encounter: Payer: Self-pay | Admitting: Internal Medicine

## 2018-04-04 ENCOUNTER — Ambulatory Visit (INDEPENDENT_AMBULATORY_CARE_PROVIDER_SITE_OTHER): Payer: Medicare Other | Admitting: Internal Medicine

## 2018-04-04 ENCOUNTER — Ambulatory Visit (INDEPENDENT_AMBULATORY_CARE_PROVIDER_SITE_OTHER): Payer: Medicare Other | Admitting: Dietician

## 2018-04-04 ENCOUNTER — Encounter: Payer: Self-pay | Admitting: Dietician

## 2018-04-04 ENCOUNTER — Other Ambulatory Visit: Payer: Self-pay

## 2018-04-04 VITALS — BP 137/72 | HR 66 | Temp 97.6°F | Ht 68.0 in | Wt 218.3 lb

## 2018-04-04 DIAGNOSIS — Z794 Long term (current) use of insulin: Secondary | ICD-10-CM | POA: Diagnosis not present

## 2018-04-04 DIAGNOSIS — E1149 Type 2 diabetes mellitus with other diabetic neurological complication: Secondary | ICD-10-CM | POA: Diagnosis not present

## 2018-04-04 DIAGNOSIS — E1142 Type 2 diabetes mellitus with diabetic polyneuropathy: Secondary | ICD-10-CM | POA: Diagnosis not present

## 2018-04-04 DIAGNOSIS — Z713 Dietary counseling and surveillance: Secondary | ICD-10-CM | POA: Diagnosis not present

## 2018-04-04 DIAGNOSIS — Z7982 Long term (current) use of aspirin: Secondary | ICD-10-CM

## 2018-04-04 MED ORDER — CANAGLIFLOZIN 100 MG PO TABS: 100.0000 mg | ORAL_TABLET | Freq: Every day | ORAL | 2 refills | Status: DC

## 2018-04-04 NOTE — Progress Notes (Signed)
   CC: DMII  HPI:Mark Cabrera is a 68 y.o. male who presents for evaluation of DMII. Please see individual problem based A/P for details.  Past Medical History:  Diagnosis Date  . Acute diastolic congestive heart failure ([place])   . Aortic valve stenosis s/p AVR 2018  . Atrial fibrillation ([place]) - post-op CABG    04/2016 CHA2DS2VAS score = 5  . Atypical nevi   . Coronary artery disease s/p 2 vessel CABG   . Depression    "years ago"  . Diabetes mellitus   . Dyspnea    in the past   . GERD (gastroesophageal reflux disease)   . Heart murmur   . Hepatic cirrhosis ([place]) 06/29/2017  . History of kidney stones   . Hyperlipidemia    hx of transaminitis secondary to statin and he has decided not to use statins secondary to potential side effects.  . Hypertension   . Nephrolithiasis   . Osteoarthritis, knee   . Sleep apnea     Central apnea. Not using cpap  . Stroke [facility]) 2007  . Transaminitis     Statin-induced  . 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 2-D echo was normal no embolic source found.   Review of Systems:  ROS negative except as per HPI.  Physical Exam: Vitals:   04/04/18 0902  BP: 137/72  Pulse: 66  Temp: 97.6 F (36.4 C)  TempSrc: Oral  SpO2: 99%  Weight: 218 lb 4.8 oz (99 kg)  Height: 5\' 8"  (1.727 m)   General: A/O x4, in no acute distress, afebrile, nondiaphoretic Cardio: RRR, no mrg's Pulmonary: CTA bilaterally MSK: BLE nontender, nonedematous   Assessment & Plan:   See Encounters Tab for problem based charting.  Patient discussed with Dr. Evette Doffing

## 2018-04-04 NOTE — Telephone Encounter (Signed)
Pt just left office, medicine isn't cover under insurance, pls contact [phone]

## 2018-04-04 NOTE — Telephone Encounter (Signed)
Called patient and notified him that we will just stay with the Insulin for now. As could not afford the [facility] due to his insurance not covering this and with his history of penile mycotic infections we will continue with insulin for now. He may be a candidate for an GLP1 vs DPP4 agent.  Kathi Ludwig, MD [facility] Internal Medicine, PGY-2

## 2018-04-04 NOTE — Patient Instructions (Addendum)
Follow-up next week for CGM download #2.   If sensor falls off, put it in a bag, and please bring it with you.   Good job taking your Humalog insulin before meals!   [name]  We have started you on a new medication called INVOKANA or canagliflozin. Please take this 100mg  tablet once per day. Please take just 10U of meal time insulin with each meal. Please continue the night time insulin. If you notice any low sugar symptoms, please eat a piece of candy right away, a small amount of food, and call us. Please also do not take your meal time insulin if you do that.   Please feel free to call our clinic number at any time of the day or night. We have an on call doctor who can call you back at night and on the weekends. If it is an emergency, please go to the ER.

## 2018-04-04 NOTE — Assessment & Plan Note (Signed)
  DMII: [name] wore the CGM for 7 days. The average reading was 166, % time in target was 61, % time below target was 0, and % time above target was. 39%. Intervention will be to start Invokana and decrease his meal time insulin to 10U with meals and continue Lantus 50U daily. The patient will be scheduled to see ACC/[name] for a final appointment.   It appears that he was taking Synjardy at one point which was discontinued by Rometta Emery and noted to be due to a change in therapy, which one did not occur. I am uncertain as to why this was stopped and the patient could not recall if there was a definitive reason. As such, I feel that the benefits greatly outway the risks and as such he should be continued on an SGLT-2i.  Plan: Humalog 10U TID WC Lantus 50U QHS INVOKANA 100mg  daily Strict precautions for hypoglycemic episodes given.  To return in 7 days.

## 2018-04-04 NOTE — Progress Notes (Signed)
Continuous glucose monitoring (CGM)  CGM Results from Download #1: Average is 166  for 8 days Time in range (70-180 mg/dL): 61% (Goal >70%) Time above range (>180 mg/dL): 39% Time below range (<70 mg/dL): 0% (Goal is <3%) Coefficient of variation: 33% (Goal is <36%) Standard variation: 54.7 mg/dL (Goal is <50 mg/dL)  Patient reports for appointment following 1 week after placement of Continuous glucose monitor.  Patient reports feelings of dizziness in the morning after waking, when getting up from bed or moving around in bed. CGM results show no lows (0% below target range). Blood sugars seem to have improved from prior appointment with a current average blood glucose of 166 and time in range being 61% of the time( Goal >70%). Patient has room for improvement, with an time above range at 39%. Most of his times above 180 occur following his first meal (breakfast around 8-9 AM) through final meal around 9 pm. He also exhibits more variability throughout the day, whereas he falls within target when he is sleeping. Variations could be due to erratic diet, changes in physical activity, non-adherence to or incorrect medications, which may reflect Mr. Ong situation and lifestyle.   Follow-up visit: MNT2  Medical Nutrition Therapy:  Appt start time: 9485 end time:  0853.  Assessment  Primary concerns today: Blood sugar control.   MEDICATIONS   50 units Lantis daily, 10 units of Humalog 3 times a day with meals.  Patient is taking his Lantus (50 units) every night but only taking Humalog (10 units) at most once a day before lunch, but reports taking only on 3 days of the past week. Patient also keeps needles on his insulin pens.   Nutrition  24-hr recall:  B (AM) - 1 c coffee with 1 sugar 1 creamer, 1 apple with peanut butter, vegetables soup or 2 bananas and 2 oranges or 4 oranges L (12 to 2 PM) - 2 c vegetable soup;  D (PM) - 2 c vegetable soup;  Snk (7 or 8 PM) - sometimes has a  snack  Patient states he eats when he is hungry until he is satisfied. He prepares what he has or eats what is available to him (including a couple bananas or oranges, recently started foraging for greens and onions and used that to make soup). Sometimes he skips meals, "not on purpose," but will be busy doing something at home and will forget to eat.    Physical activity   Includes going outside and cutting wood, walking to friends house about a 1/2 mile to 1 mile away, movement around the house - including to start firewood stove.  Patient had weight taken during his appointment today, which seems to be stable over the past 7 months.   Wt Readings from Last 5 Encounters:  04/04/18 218 lb 4.8 oz (99 kg)  01/18/18 217 lb 12.8 oz (98.8 kg)  12/13/17 206 lb 12.8 oz (93.8 kg)  08/28/17 218 lb (98.9 kg)  08/13/17 219 lb (99.3 kg)   Progress Towards Goal(s):  Some progress.  Nutritional Diagnosis:  NB-1.1 Food and nutrition-related knowledge deficit As related to improper insulin injection and usage is improving  As evidenced by patient imrpved blood sugars and patient report of taking Humalog a little more frequently.  NB-1.6 Limited adherence to nutrition-related recommendations As related to insulin administration as prescribed by doctor.  As evidenced by patient reporting taking Lantus (50 units once a day before lunch) as prescribed but taking Humalog (10 units, 3  times a day before meals) at most once a day and only taking 3 doses in the past week.    Intervention:    Nutrition - Patient has limited finances and resources, and has limited ability to change his diet. Patient was encouraged to diversify his diet in simple ways when he eats, such as pairing his fruits and oats with chia seeds and flax seeds to better control his blood sugar.   Medication - Patient was encouraged to remove needles after every injection, to dispose of correctly, and to not re-use them. Gave patient a pack of  needles to take home and encouraged him to take his insulin as prescribed.   Following his doctors appointment, Mr. Emond was prescribed Invokana once a day in addition to 10 units of Humalog 3 times a day and instructed to hold his insulin if he has symptoms of low blood sugar to better control his blood sugars.  Handouts:  After visit summary in Dr. Nelma Rothman note, CGM download #1 report, Insulin needles   Coordination of care:co visit with Dr. Marcello Fennel  Monitoring/Evaluation:   Patient to return in one week for CGM download #2, will discuss report results and adherence to new medication. Will also obtain an update to see if he is properly injecting insulin and rotating injection sites (educated during MNT visit #1).   Debera Lat, RD 04/04/2018 1:28 PM.

## 2018-04-05 NOTE — Progress Notes (Signed)
Internal Medicine Clinic Attending  Case discussed with Dr. Harbrecht at the time of the visit.  We reviewed the resident's history and exam and pertinent patient test results.  I agree with the assessment, diagnosis, and plan of care documented in the resident's note.   

## 2018-04-08 ENCOUNTER — Other Ambulatory Visit: Payer: Self-pay | Admitting: Internal Medicine

## 2018-04-08 ENCOUNTER — Other Ambulatory Visit: Payer: Medicare Other

## 2018-04-11 ENCOUNTER — Telehealth: Payer: Self-pay | Admitting: Dietician

## 2018-04-11 ENCOUNTER — Ambulatory Visit: Payer: Medicare Other | Admitting: Dietician

## 2018-04-11 ENCOUNTER — Ambulatory Visit: Payer: Medicare Other

## 2018-04-11 NOTE — Telephone Encounter (Signed)
Mark Cabrera rescheduled final Continuous glucose monitoring visit to next week.

## 2018-04-12 ENCOUNTER — Encounter: Payer: Self-pay | Admitting: Internal Medicine

## 2018-04-12 ENCOUNTER — Encounter: Payer: Self-pay | Admitting: Dietician

## 2018-04-12 ENCOUNTER — Ambulatory Visit (INDEPENDENT_AMBULATORY_CARE_PROVIDER_SITE_OTHER): Payer: Medicare Other | Admitting: Dietician

## 2018-04-12 ENCOUNTER — Other Ambulatory Visit: Payer: Self-pay

## 2018-04-12 ENCOUNTER — Ambulatory Visit (INDEPENDENT_AMBULATORY_CARE_PROVIDER_SITE_OTHER): Payer: Medicare Other | Admitting: Internal Medicine

## 2018-04-12 DIAGNOSIS — Z794 Long term (current) use of insulin: Secondary | ICD-10-CM | POA: Diagnosis not present

## 2018-04-12 DIAGNOSIS — Z713 Dietary counseling and surveillance: Secondary | ICD-10-CM | POA: Diagnosis not present

## 2018-04-12 DIAGNOSIS — E1140 Type 2 diabetes mellitus with diabetic neuropathy, unspecified: Secondary | ICD-10-CM | POA: Diagnosis not present

## 2018-04-12 DIAGNOSIS — E1149 Type 2 diabetes mellitus with other diabetic neurological complication: Secondary | ICD-10-CM

## 2018-04-12 DIAGNOSIS — Z7982 Long term (current) use of aspirin: Secondary | ICD-10-CM | POA: Diagnosis not present

## 2018-04-12 NOTE — Patient Instructions (Signed)
Mr. Ferrall,   So good to see you today.   I agree with you that taking your Humalog before breakfast would be helpful to keeping your blood sugar from being elevated around lunchtime. This could help you have more energy also.   It sounds like where you are keeping your Humalog is a problem to your taking it before breakfast.   ? Where else could you keep it to help you remember to take it before eating in the morning?   Hope you figure something out.   [name] [phone]

## 2018-04-12 NOTE — Progress Notes (Signed)
Follow-up visit: MNT  Medical Nutrition Therapy:  Appt start time: 6767 end time:  0853. Visit 3  Assessment  Mark Cabrera presents for Continuous glucose monitoring  CGM Results from Download #2: Average is  165  for 15 days Time in range (70-180 mg/dL): 61% (Goal >70%) Time above range (>180 mg/dL) 39% Time below range (<70 mg/dL): 0% (Goal is <3%) Coefficient of variation: 33.8% (Goal is <36%) Standard variation: 55.7 mg/dL (Goal is <50 mg/dL)  CGM report shows glucose rising after breakfast and staying elevated throughout the day, particularly around lunch and dinnertime. Patient's blood sugar is within target range from around 10pm at night to about 9am in the morning. 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   MEDICATIONS  Patient reports adherence to 50 units lantus each night about 10 PM and 10 units of Humalog, but states he takes at most two injections a day (usually lunch and dinner). Patient asks what he needs to do in order to achieve better glucose control.   Primary concerns today: Blood sugar control.   Progress Towards Goal(s):  Some progress.  Nutritional Diagnosis:  NB-1.1 Food and nutrition-related knowledge deficit As related to improper insulin injection and usage is improving  As evidenced by patient imrpved blood sugars and patient report of taking Humalog a little more frequently.  NB-1.6 Limited adherence to nutrition-related recommendations As related to insulin administration as prescribed by doctor is improving  As evidenced by patient reporting taking Humalog (10 units, 3 times a day before meals) at most twice a day.    Intervention:    Encouraged patient to take his meal-time insulin prior to breakfast, as this would help in reducing the highs that occur after lunchtime. Asked patient where he is keeping his Humalog and states that he keeps it in the garage where it is cool. Suggested  he  keep it in his bedroom, but patient states it gets really hot when he has a fire going. Encouraged patient to think about where he can put it that would increase his likelihood of injecting insulin prior to lunch.  Handouts:  After visit summary,  CGM download #2 report  Coordination of care:co visit with Dr. Berline Lopes  Monitoring/Evaluation:   Patient to follow up as needed in office. Will call to follow up on insulin injection technique and rotating injection sites.   15 minutes spent downloading and gathering cgm information, 21 minutes spent on [place], RD 04/12/2018 10:34 AM.

## 2018-04-12 NOTE — Patient Instructions (Signed)
FOLLOW-UP INSTRUCTIONS When: In March to see Dr. Heber Murray What to bring: All of your medications and your glucose meter  Today we discussed your diabetes. Please take the Lantus insulin every night. Please also take 10 units of the Novolog short acting or meal time insulin with your two largest meals that are at least 4 hours apart. There are other medications we may be able to try if this does not work.  Thank you for your visit to the [facility] today. If you have any questions or concerns please call us at [phone].

## 2018-04-12 NOTE — Assessment & Plan Note (Addendum)
  DMII: [name] wore the CGM for 14 days. The average reading was 165, % time in target was 61, % time below target was 0, and % time above target was. 39. Intervention will be to reinforce the importance of meal time insulin usage. He endorses close adherence to his Lantus 30 units nightly but given that there is not someone located distantly from his food switch location he often forgets his medication.  The most notable barrier to proper treatment appears to be lack of proximity of his medication to food.  We have discussed taking the insulin 10 units Humalog 2 times daily with his 2 orders milliliters 4 hours apart and continue the Lantus.. The patient will be scheduled to see his PCP Dr. Heber Belleview in March.  Notable barriers to treatment have been addressed.  I feel the patient may be an ideal candidate for Januvia if deemed appropriate by his PCP at his next visit. Given his penial candidiasis infections I do not feel he is appropriate for an SGLT-2i.

## 2018-04-12 NOTE — Progress Notes (Signed)
Internal Medicine Clinic Attending  Case discussed with Dr. Harbrecht at the time of the visit.  We reviewed the resident's history and exam and pertinent patient test results.  I agree with the assessment, diagnosis, and plan of care documented in the resident's note.   

## 2018-04-12 NOTE — Progress Notes (Signed)
   CC: Diabetes  HPI:Mark Cabrera is a 68 y.o. male who presents for evaluation of diabetes. Please see individual problem based A/P for details.  Past Medical History:  Diagnosis Date  . Acute diastolic congestive heart failure ([place])   . Aortic valve stenosis s/p AVR 2018  . Atrial fibrillation ([place]) - post-op CABG    04/2016 CHA2DS2VAS score = 5  . Atypical nevi   . Coronary artery disease s/p 2 vessel CABG   . Depression    "years ago"  . Diabetes mellitus   . Dyspnea    in the past   . GERD (gastroesophageal reflux disease)   . Heart murmur   . Hepatic cirrhosis ([place]) 06/29/2017  . History of kidney stones   . Hyperlipidemia    hx of transaminitis secondary to statin and he has decided not to use statins secondary to potential side effects.  . Hypertension   . Nephrolithiasis   . Osteoarthritis, knee   . Sleep apnea     Central apnea. Not using cpap  . Stroke [facility]) 2007  . Transaminitis     Statin-induced  . 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 2-D echo was normal no embolic source found.   Review of Systems:  ROS negative except as per HPI.  Physical Exam: Vitals:   04/12/18 1003  BP: 134/62  Pulse: 66  Temp: (!) 97.4 F (36.3 C)  TempSrc: Axillary  SpO2: 99%  Weight: 218 lb 9.6 oz (99.2 kg)  Height: 5\' 8"  (1.727 m)   General: A/O x4, in no acute distress, afebrile, nondiaphoretic MSK: BLE nontender, nonedematous  Assessment & Plan:   See Encounters Tab for problem based charting.  Patient discussed with Dr. Dareen Piano

## 2018-04-15 NOTE — Telephone Encounter (Signed)
Requesting a refill on synjardy to be filled @  [place], [place] - [place] [address] [phone] (Phone) [phone] (Fax)   Please call pt back.

## 2018-04-16 DIAGNOSIS — E119 Type 2 diabetes mellitus without complications: Secondary | ICD-10-CM | POA: Diagnosis not present

## 2018-04-16 DIAGNOSIS — H25813 Combined forms of age-related cataract, bilateral: Secondary | ICD-10-CM | POA: Diagnosis not present

## 2018-04-16 DIAGNOSIS — Z794 Long term (current) use of insulin: Secondary | ICD-10-CM | POA: Diagnosis not present

## 2018-04-16 DIAGNOSIS — H35372 Puckering of macula, left eye: Secondary | ICD-10-CM | POA: Diagnosis not present

## 2018-04-19 ENCOUNTER — Ambulatory Visit: Payer: Medicare Other | Admitting: Dietician

## 2018-04-24 DIAGNOSIS — H25811 Combined forms of age-related cataract, right eye: Secondary | ICD-10-CM | POA: Diagnosis not present

## 2018-04-24 DIAGNOSIS — H2511 Age-related nuclear cataract, right eye: Secondary | ICD-10-CM | POA: Diagnosis not present

## 2018-05-06 DIAGNOSIS — H2512 Age-related nuclear cataract, left eye: Secondary | ICD-10-CM | POA: Diagnosis not present

## 2018-05-08 DIAGNOSIS — H25812 Combined forms of age-related cataract, left eye: Secondary | ICD-10-CM | POA: Diagnosis not present

## 2018-05-08 DIAGNOSIS — H2512 Age-related nuclear cataract, left eye: Secondary | ICD-10-CM | POA: Diagnosis not present

## 2018-05-22 DIAGNOSIS — H35372 Puckering of macula, left eye: Secondary | ICD-10-CM | POA: Diagnosis not present

## 2018-05-22 DIAGNOSIS — Z961 Presence of intraocular lens: Secondary | ICD-10-CM | POA: Diagnosis not present

## 2018-06-06 ENCOUNTER — Encounter: Payer: Medicare Other | Admitting: Internal Medicine

## 2018-06-17 ENCOUNTER — Telehealth: Payer: Self-pay | Admitting: Dietician

## 2018-06-17 DIAGNOSIS — E1142 Type 2 diabetes mellitus with diabetic polyneuropathy: Secondary | ICD-10-CM

## 2018-06-17 MED ORDER — GLUCOSE BLOOD VI STRP: ORAL_STRIP | 12 refills | Status: DC

## 2018-06-17 NOTE — Telephone Encounter (Addendum)
Spoke with Mr. Bumbaugh in presence of our triage nurse. He has no fever, he does not want an appointment, he want recommendations of how much Humalog to take. He reports that he stopped testing his blood sugar about the time he had cataract surgery which was 3-4 weeks ago. He reports that he has taken Humalog 3-4 times 10 units during the past 3-4 weeks and has consistently taken his lantus 25 unit twice a day. In fact this am he took 30 unit lantus and 20 units Humalog at 8 AM when he saw how high his blood sugar was. He had another strips and all it read was "HI", but he didn't way when that was.  He has been feeling dizzy and ":squirrley for the past 3-4 weeks and attributed it to his vision/cataract surgery. However,  His recent follow up visit to the eye doctor he got a report was that everything was fine. Asked hi to decrease carb containing foods and drink a lot of water, and to take his Humalog when he eats his next meal. I will consult with the attending for advice on increased Humalog doses until blood sugars normalize. His current correction factor is 30 mg/dl. Suggest 20 units Humalog 2-3 times today before 2-3 largest meals.  (He says he eats 3-4 times a day.) Could consider a weekly GLP-1, if rybelsus (oral semaglutide) is not covered then semaglutide might be an option to reduce his dependence on mealtime insulin.

## 2018-06-17 NOTE — Telephone Encounter (Signed)
Daughter calls saying she is calling for her father because he did not know how to get in touch with [place].  Per daughter his Blood sugar was  580 this morning and she is on her way to get him more test strips so he can check it more. He asked her to call because he does not know what to do/ how much insulin to take. I asked if he had other symptoms and she said he reported being Dizzy at 8 am, ate sugar. She prefers we call him for more details. Will coordinate care with triage nurse.

## 2018-06-17 NOTE — Telephone Encounter (Signed)
Spoke with Dr. Lynnae January who agrees for Mark Cabrera to increase Humalog temporarily to 20 units twice a day today and possibly tomorrow if blood sugars still elevated and to Check blood sugar 4 times a day before meals and bedtime. Will follow up tomorrow with call to  Reassess blood sugars.  Tried calling Mark Cabrera, but he had poor cell phone reception and agreed to return call.  Also advised, take usual Lantus.He verbalized understanding and repeated back instructions. He checked his blood sugar (580 mg/dl) and took 20 units.

## 2018-06-17 NOTE — Telephone Encounter (Signed)
Noted. Was informed he has no sxs c/w HONK / DKA. Agree with plan. pls sch [facility] tele appt to discuss stopping novolog and trying GLP1 - either oral or weekly injection.

## 2018-06-18 NOTE — Telephone Encounter (Signed)
Blood sugar 480 ~ 30 minutes after eating and taking 20 units of insulin this afternoon. He asks about taking another 20 units tonight. I advised him to only take 20 units before meals when blood sugar >400mg /dl, to take 10 units if blood sugar is  <400 mg/dl. Asked him to be sure to check his blood sugar before eating and bedtime tonight. He verbalized understanding.

## 2018-06-18 NOTE — Telephone Encounter (Signed)
Called to follow up on blood sugars:he has not checked today- pharmacy would not give his daughter test strips so he is out of strips. Feels like sugar is still high- Still wobbly, vision messed up. Has taken 20 units Humalog once yesterday and once today. Agrees to take with next meal. Pharmacy had to order strips. Mark Cabrera is going to pick them up.

## 2018-06-19 NOTE — Telephone Encounter (Signed)
Follow up on veryhigh blood sugars: Starting to feel better. His blood sugar was 284 this morning, took 20 units Humalog before his meal.He has not checked his blood sugar again today. We discussed resuming his usual 10 units Humalog twice daily before meals unless he sees a blood sugar of >400 mg/dl. He was advised to continue to self monitor and drink plenty of water.  Plan: resume 10 units Humalog twice daily before largest meals of the day unless >400mg /dl then repeat test and if >400 mg/dl again, take 20 units. He requested a call tomorrow Debera Lat, RD 06/19/2018 2:07 PM.

## 2018-06-20 NOTE — Telephone Encounter (Addendum)
Follow up on very high blood sugars:  Blood sugar: >300 this am- had eaten orange and something else,did not take blood sugar last night,  His blood sugar is 350 now- took 10 units then layed down. After blood sugar this am  Insulin: lantus 25 twice a day, Novolog 20 units twece yesterday Follow up plan:talked to attending, recommend:  increase novolog to 15 units 3x/day and continue CBGS before meals and bedtime.  Patient notified. Discussed reminders on his cell phone but he says he doesn't;t have anyone to help him see if that is possible and he does not know how to do it. Follow up call in 1 week.  Debera Lat, RD 06/20/2018 3:19 PM.

## 2018-06-21 NOTE — Telephone Encounter (Signed)
Reviewed plan with Mark Cabrera.

## 2018-06-27 ENCOUNTER — Telehealth: Payer: Self-pay | Admitting: Dietician

## 2018-06-27 ENCOUNTER — Other Ambulatory Visit: Payer: Self-pay

## 2018-06-27 ENCOUNTER — Ambulatory Visit (INDEPENDENT_AMBULATORY_CARE_PROVIDER_SITE_OTHER): Payer: Medicare Other | Admitting: Internal Medicine

## 2018-06-27 DIAGNOSIS — Z7982 Long term (current) use of aspirin: Secondary | ICD-10-CM

## 2018-06-27 DIAGNOSIS — E1140 Type 2 diabetes mellitus with diabetic neuropathy, unspecified: Secondary | ICD-10-CM

## 2018-06-27 DIAGNOSIS — Z794 Long term (current) use of insulin: Secondary | ICD-10-CM

## 2018-06-27 DIAGNOSIS — E118 Type 2 diabetes mellitus with unspecified complications: Secondary | ICD-10-CM | POA: Diagnosis not present

## 2018-06-27 NOTE — Progress Notes (Signed)
Medicine attending: Medical history, presenting problems,  and medications, reviewed with resident physician Dr [name] on the day of the patient telephone consultation and I concur with his evaluation and management plan. Poorly controlled type 2 DM, sugars over 500. There may be an issue w self admin of insulin. Pt counseled by Dr Tarri Abernethy; will call pt again tomorrow.

## 2018-06-27 NOTE — Telephone Encounter (Addendum)
Called Mark Cabrera to follow up on his elevated blood sugars.  He reports he is still having high blood sugars, still feels bad, no energy, equilibrium is off,  staggering around. Thirsty, dizzy, vision is screwed up.  Blood sugar 311 this am fasting; 550 last night at bedtime- took extra 5 units humalog  Has been taking 15 units humalog every day for the past week and 50 units of Humalog. He denies taking any diabetes pills or metformin.  He agreed to a virtual doctor appointment anytime this afternoon Will ask front office to schedule Debera Lat, RD 06/27/2018 12:20 PM.

## 2018-06-27 NOTE — Progress Notes (Signed)
   CC: Hyperglycemia  This is a telephone encounter between [employer] and Mark Cabrera on 06/27/2018 for hyperglycemia. The visit was conducted with the patient located at home and [facility]. The patient's identity was confirmed using their DOB and current address. The patient has consented to being evaluated through a telephone encounter and understands the associated risks (an examination cannot be done and the patient may need to come in for an appointment) / benefits (allows the patient to remain at home, decreasing exposure to coronavirus). I personally spent 20 minutes on medical discussion.   HPI:  [name] is a 68 y.o. with PMH as below.   Please see A&P for assessment of the patient's acute and chronic medical conditions.   Past Medical History:  Diagnosis Date  . Acute diastolic congestive heart failure ([place])   . Aortic valve stenosis s/p AVR 2018  . Atrial fibrillation ([place]) - post-op CABG    04/2016 CHA2DS2VAS score = 5  . Atypical nevi   . Coronary artery disease s/p 2 vessel CABG   . Depression    "years ago"  . Diabetes mellitus   . Dyspnea    in the past   . GERD (gastroesophageal reflux disease)   . Heart murmur   . Hepatic cirrhosis ([place]) 06/29/2017  . History of kidney stones   . Hyperlipidemia    hx of transaminitis secondary to statin and he has decided not to use statins secondary to potential side effects.  . Hypertension   . Nephrolithiasis   . Osteoarthritis, knee   . Sleep apnea     Central apnea. Not using cpap  . Stroke [facility]) 2007  . Transaminitis     Statin-induced  . 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 2-D echo was normal no embolic source  found.   Review of Systems:  Performed and all others negative.  Assessment & Plan:   See Encounters Tab for problem based charting.  Patient discussed with Dr. Beryle Beams

## 2018-06-27 NOTE — Assessment & Plan Note (Signed)
HPI: Patient called with concerns of hyperglycemia for the past 1 month. States that he has had to slowly escalate his insulin and been unable to get his CBG down. He is experiencing polydipsia, polyuria, generalized fatigue, weakness, and visual changes. He voices that part of the problem is his diet and drinking a lot of juice. The other issue is that he often does not have his Humalog with him and misses doses. For the past week he has consistently been taking Lantus 25 units BID and Humalog 15 units TID. Last night his CBG was 580 so he took 5 extra units of Humalog and his 25 units of Lantus. When he woke up his CBG was 311. He has not checked it since.   He states that he just picked up NEW insulin pens 1-2 weeks ago. He stores them in a fridge and rotates his injection sites. When he does inject he often withdrawals the pen the moment it hits 0. When removing the needle he does notice a couple drops continue to come out of the pen.   In regards to his DM history he has been on insulin for ~12 months and never been hospitalized for DM related complications (DKA, HHS).   A/P: - Uncontrolled hyperglycemia  - Stressed the importance of staying hydrated. - Discuss counting for 5 seconds prior to withdrawaling pen needle from skin  - He will take 15 units of Humalog now and recheck his CBG prior to bedtime. If >300 he will take an additional 10 units of Humalog. If <300 but >200 he will take 5 extra units of humalog.  - Continue Lantus 25 units BID  - Discussed signs and symptoms of hypoglycemia  - I will call him in the AM to reassess. Patient has my cell phone number in case there is any confusion tonight with insulin regimen.

## 2018-06-28 ENCOUNTER — Other Ambulatory Visit: Payer: Self-pay

## 2018-06-28 ENCOUNTER — Emergency Department (HOSPITAL_COMMUNITY): Payer: Medicare Other

## 2018-06-28 ENCOUNTER — Inpatient Hospital Stay (HOSPITAL_COMMUNITY)
Admission: EM | Admit: 2018-06-28 | Discharge: 2018-07-01 | DRG: 065 | Disposition: A | Discharge status: Rehabilitation Facility | Payer: Medicare Other | Attending: Internal Medicine | Admitting: Internal Medicine

## 2018-06-28 ENCOUNTER — Encounter (HOSPITAL_COMMUNITY): Payer: Self-pay

## 2018-06-28 ENCOUNTER — Telehealth: Payer: Self-pay | Admitting: Internal Medicine

## 2018-06-28 DIAGNOSIS — I350 Nonrheumatic aortic (valve) stenosis: Secondary | ICD-10-CM | POA: Diagnosis present

## 2018-06-28 DIAGNOSIS — E669 Obesity, unspecified: Secondary | ICD-10-CM | POA: Diagnosis present

## 2018-06-28 DIAGNOSIS — I447 Left bundle-branch block, unspecified: Secondary | ICD-10-CM | POA: Diagnosis not present

## 2018-06-28 DIAGNOSIS — Z951 Presence of aortocoronary bypass graft: Secondary | ICD-10-CM

## 2018-06-28 DIAGNOSIS — Z953 Presence of xenogenic heart valve: Secondary | ICD-10-CM

## 2018-06-28 DIAGNOSIS — R531 Weakness: Secondary | ICD-10-CM | POA: Diagnosis not present

## 2018-06-28 DIAGNOSIS — E1142 Type 2 diabetes mellitus with diabetic polyneuropathy: Secondary | ICD-10-CM | POA: Diagnosis present

## 2018-06-28 DIAGNOSIS — I6320 Cerebral infarction due to unspecified occlusion or stenosis of unspecified precerebral arteries: Secondary | ICD-10-CM | POA: Diagnosis not present

## 2018-06-28 DIAGNOSIS — R42 Dizziness and giddiness: Secondary | ICD-10-CM | POA: Diagnosis not present

## 2018-06-28 DIAGNOSIS — I69351 Hemiplegia and hemiparesis following cerebral infarction affecting right dominant side: Secondary | ICD-10-CM

## 2018-06-28 DIAGNOSIS — R51 Headache: Secondary | ICD-10-CM | POA: Diagnosis not present

## 2018-06-28 DIAGNOSIS — Z66 Do not resuscitate: Secondary | ICD-10-CM | POA: Diagnosis present

## 2018-06-28 DIAGNOSIS — I482 Chronic atrial fibrillation, unspecified: Secondary | ICD-10-CM | POA: Diagnosis not present

## 2018-06-28 DIAGNOSIS — K7460 Unspecified cirrhosis of liver: Secondary | ICD-10-CM | POA: Diagnosis present

## 2018-06-28 DIAGNOSIS — E1165 Type 2 diabetes mellitus with hyperglycemia: Secondary | ICD-10-CM | POA: Diagnosis present

## 2018-06-28 DIAGNOSIS — R011 Cardiac murmur, unspecified: Secondary | ICD-10-CM | POA: Diagnosis not present

## 2018-06-28 DIAGNOSIS — I110 Hypertensive heart disease with heart failure: Secondary | ICD-10-CM | POA: Diagnosis present

## 2018-06-28 DIAGNOSIS — I2510 Atherosclerotic heart disease of native coronary artery without angina pectoris: Secondary | ICD-10-CM | POA: Diagnosis not present

## 2018-06-28 DIAGNOSIS — I639 Cerebral infarction, unspecified: Secondary | ICD-10-CM | POA: Diagnosis not present

## 2018-06-28 DIAGNOSIS — I6389 Other cerebral infarction: Secondary | ICD-10-CM | POA: Diagnosis not present

## 2018-06-28 DIAGNOSIS — G9389 Other specified disorders of brain: Secondary | ICD-10-CM | POA: Diagnosis not present

## 2018-06-28 DIAGNOSIS — G4733 Obstructive sleep apnea (adult) (pediatric): Secondary | ICD-10-CM | POA: Diagnosis present

## 2018-06-28 DIAGNOSIS — I5032 Chronic diastolic (congestive) heart failure: Secondary | ICD-10-CM | POA: Diagnosis present

## 2018-06-28 DIAGNOSIS — I5030 Unspecified diastolic (congestive) heart failure: Secondary | ICD-10-CM | POA: Diagnosis not present

## 2018-06-28 DIAGNOSIS — E785 Hyperlipidemia, unspecified: Secondary | ICD-10-CM | POA: Diagnosis not present

## 2018-06-28 DIAGNOSIS — E7800 Pure hypercholesterolemia, unspecified: Secondary | ICD-10-CM | POA: Diagnosis present

## 2018-06-28 DIAGNOSIS — Z87442 Personal history of urinary calculi: Secondary | ICD-10-CM

## 2018-06-28 DIAGNOSIS — Z888 Allergy status to other drugs, medicaments and biological substances status: Secondary | ICD-10-CM

## 2018-06-28 DIAGNOSIS — Z811 Family history of alcohol abuse and dependence: Secondary | ICD-10-CM

## 2018-06-28 DIAGNOSIS — Z79899 Other long term (current) drug therapy: Secondary | ICD-10-CM

## 2018-06-28 DIAGNOSIS — I6300 Cerebral infarction due to thrombosis of unspecified precerebral artery: Secondary | ICD-10-CM | POA: Diagnosis not present

## 2018-06-28 DIAGNOSIS — Z7982 Long term (current) use of aspirin: Secondary | ICD-10-CM

## 2018-06-28 DIAGNOSIS — Z7901 Long term (current) use of anticoagulants: Secondary | ICD-10-CM

## 2018-06-28 DIAGNOSIS — Z6831 Body mass index (BMI) 31.0-31.9, adult: Secondary | ICD-10-CM

## 2018-06-28 DIAGNOSIS — Z794 Long term (current) use of insulin: Secondary | ICD-10-CM

## 2018-06-28 DIAGNOSIS — R262 Difficulty in walking, not elsewhere classified: Secondary | ICD-10-CM | POA: Diagnosis not present

## 2018-06-28 DIAGNOSIS — H53462 Homonymous bilateral field defects, left side: Secondary | ICD-10-CM | POA: Diagnosis present

## 2018-06-28 DIAGNOSIS — Z952 Presence of prosthetic heart valve: Secondary | ICD-10-CM | POA: Diagnosis not present

## 2018-06-28 DIAGNOSIS — I340 Nonrheumatic mitral (valve) insufficiency: Secondary | ICD-10-CM | POA: Diagnosis not present

## 2018-06-28 DIAGNOSIS — B351 Tinea unguium: Secondary | ICD-10-CM | POA: Diagnosis not present

## 2018-06-28 DIAGNOSIS — R29704 NIHSS score 4: Secondary | ICD-10-CM | POA: Diagnosis present

## 2018-06-28 DIAGNOSIS — I4891 Unspecified atrial fibrillation: Secondary | ICD-10-CM | POA: Diagnosis not present

## 2018-06-28 DIAGNOSIS — Z833 Family history of diabetes mellitus: Secondary | ICD-10-CM

## 2018-06-28 DIAGNOSIS — G8194 Hemiplegia, unspecified affecting left nondominant side: Secondary | ICD-10-CM | POA: Diagnosis not present

## 2018-06-28 DIAGNOSIS — Z8249 Family history of ischemic heart disease and other diseases of the circulatory system: Secondary | ICD-10-CM

## 2018-06-28 DIAGNOSIS — E1169 Type 2 diabetes mellitus with other specified complication: Secondary | ICD-10-CM | POA: Diagnosis not present

## 2018-06-28 DIAGNOSIS — I6523 Occlusion and stenosis of bilateral carotid arteries: Secondary | ICD-10-CM | POA: Diagnosis present

## 2018-06-28 DIAGNOSIS — M25512 Pain in left shoulder: Secondary | ICD-10-CM | POA: Diagnosis not present

## 2018-06-28 DIAGNOSIS — Z791 Long term (current) use of non-steroidal anti-inflammatories (NSAID): Secondary | ICD-10-CM

## 2018-06-28 DIAGNOSIS — I63431 Cerebral infarction due to embolism of right posterior cerebral artery: Secondary | ICD-10-CM | POA: Diagnosis not present

## 2018-06-28 DIAGNOSIS — I10 Essential (primary) hypertension: Secondary | ICD-10-CM | POA: Diagnosis not present

## 2018-06-28 DIAGNOSIS — G8929 Other chronic pain: Secondary | ICD-10-CM | POA: Diagnosis not present

## 2018-06-28 DIAGNOSIS — Z885 Allergy status to narcotic agent status: Secondary | ICD-10-CM | POA: Diagnosis not present

## 2018-06-28 DIAGNOSIS — E119 Type 2 diabetes mellitus without complications: Secondary | ICD-10-CM | POA: Diagnosis not present

## 2018-06-28 DIAGNOSIS — Z880 Allergy status to penicillin: Secondary | ICD-10-CM

## 2018-06-28 DIAGNOSIS — I6381 Other cerebral infarction due to occlusion or stenosis of small artery: Secondary | ICD-10-CM

## 2018-06-28 DIAGNOSIS — M75102 Unspecified rotator cuff tear or rupture of left shoulder, not specified as traumatic: Secondary | ICD-10-CM | POA: Diagnosis not present

## 2018-06-28 DIAGNOSIS — I361 Nonrheumatic tricuspid (valve) insufficiency: Secondary | ICD-10-CM | POA: Diagnosis not present

## 2018-06-28 LAB — COMPREHENSIVE METABOLIC PANEL
ALT: 25 U/L (ref 0–44)
AST: 27 U/L (ref 15–41)
Albumin: 3.9 g/dL (ref 3.5–5.0)
Alkaline Phosphatase: 81 U/L (ref 38–126)
Anion gap: 11 (ref 5–15)
BUN: 11 mg/dL (ref 8–23)
CO2: 25 mmol/L (ref 22–32)
Calcium: 9.5 mg/dL (ref 8.9–10.3)
Chloride: 102 mmol/L (ref 98–111)
Creatinine, Ser: 0.9 mg/dL (ref 0.61–1.24)
GFR calc Af Amer: >60 mL/min (ref >60)
GFR calc non Af Amer: >60 mL/min (ref >60)
Glucose, Bld: 196 mg/dL — ABNORMAL HIGH (ref 70–99)
Potassium: 3.8 mmol/L (ref 3.5–5.1)
Sodium: 138 mmol/L (ref 135–145)
Total Bilirubin: 1.2 mg/dL (ref 0.3–1.2)
Total Protein: 7.2 g/dL (ref 6.5–8.1)

## 2018-06-28 LAB — CBC
HCT: 41.7 % (ref 39.0–52.0)
Hemoglobin: 13.7 g/dL (ref 13.0–17.0)
MCH: 28.8 pg (ref 26.0–34.0)
MCHC: 32.9 g/dL (ref 30.0–36.0)
MCV: 87.6 fL (ref 80.0–100.0)
Platelets: 226 10*3/uL (ref 150–400)
RBC: 4.76 MIL/uL (ref 4.22–5.81)
RDW: 12.5 % (ref 11.5–15.5)
WBC: 4.8 10*3/uL (ref 4.0–10.5)
nRBC: 0 % (ref 0.0–0.2)

## 2018-06-28 LAB — PROTIME-INR
INR: 1.1 (ref 0.8–1.2)
Prothrombin Time: 13.6 seconds (ref 11.4–15.2)

## 2018-06-28 LAB — DIFFERENTIAL
Abs Immature Granulocytes: 0.02 10*3/uL (ref 0.00–0.07)
Basophils Absolute: 0 10*3/uL (ref 0.0–0.1)
Basophils Relative: 0 %
Eosinophils Absolute: 0.1 10*3/uL (ref 0.0–0.5)
Eosinophils Relative: 2 %
Immature Granulocytes: 0 %
Lymphocytes Relative: 33 %
Lymphs Abs: 1.6 10*3/uL (ref 0.7–4.0)
Monocytes Absolute: 0.2 10*3/uL (ref 0.1–1.0)
Monocytes Relative: 5 %
Neutro Abs: 2.9 10*3/uL (ref 1.7–7.7)
Neutrophils Relative %: 60 %

## 2018-06-28 LAB — APTT: aPTT: 28 seconds (ref 24–36)

## 2018-06-28 LAB — GLUCOSE, CAPILLARY: Glucose-Capillary: 253 mg/dL — ABNORMAL HIGH (ref 70–99)

## 2018-06-28 LAB — CBG MONITORING, ED: Glucose-Capillary: 186 mg/dL — ABNORMAL HIGH (ref 70–99)

## 2018-06-28 MED ORDER — ATORVASTATIN CALCIUM 40 MG PO TABS: 40.0000 mg | ORAL_TABLET | Freq: Every day | ORAL | Status: DC

## 2018-06-28 MED ORDER — INSULIN GLARGINE 100 UNIT/ML ~~LOC~~ SOLN
15.0000 [IU] | Freq: Two times a day (BID) | SUBCUTANEOUS | Status: DC
  Administered 2018-06-28 – 2018-06-29 (×3): 15 [IU] via SUBCUTANEOUS
  Filled 2018-06-28 (×6): qty 0.15

## 2018-06-28 MED ORDER — ENOXAPARIN SODIUM 40 MG/0.4ML ~~LOC~~ SOLN
40.0000 mg | SUBCUTANEOUS | Status: DC
  Administered 2018-06-28: 40 mg via SUBCUTANEOUS
  Filled 2018-06-28: qty 0.4

## 2018-06-28 MED ORDER — ACETAMINOPHEN 650 MG RE SUPP: 650.0000 mg | RECTAL | Status: DC | PRN

## 2018-06-28 MED ORDER — SODIUM CHLORIDE 0.9% FLUSH: 3.0000 mL | Freq: Once | INTRAVENOUS | Status: DC

## 2018-06-28 MED ORDER — SENNOSIDES-DOCUSATE SODIUM 8.6-50 MG PO TABS: 1.0000 | ORAL_TABLET | Freq: Every evening | ORAL | Status: DC | PRN

## 2018-06-28 MED ORDER — GABAPENTIN 100 MG PO CAPS
100.0000 mg | ORAL_CAPSULE | Freq: Three times a day (TID) | ORAL | Status: DC
  Administered 2018-06-28 – 2018-07-01 (×8): 100 mg via ORAL
  Filled 2018-06-28 (×8): qty 1

## 2018-06-28 MED ORDER — INSULIN ASPART 100 UNIT/ML ~~LOC~~ SOLN
8.0000 [IU] | Freq: Three times a day (TID) | SUBCUTANEOUS | Status: DC
  Administered 2018-06-29 – 2018-06-30 (×4): 8 [IU] via SUBCUTANEOUS

## 2018-06-28 MED ORDER — ACETAMINOPHEN 160 MG/5ML PO SOLN: 650.0000 mg | ORAL | Status: DC | PRN

## 2018-06-28 MED ORDER — ACETAMINOPHEN 325 MG PO TABS
650.0000 mg | ORAL_TABLET | ORAL | Status: DC | PRN
  Administered 2018-06-29 – 2018-06-30 (×2): 650 mg via ORAL
  Filled 2018-06-28 (×2): qty 2

## 2018-06-28 MED ORDER — INSULIN ASPART 100 UNIT/ML ~~LOC~~ SOLN
0.0000 [IU] | Freq: Three times a day (TID) | SUBCUTANEOUS | Status: DC
  Administered 2018-06-29 (×3): 3 [IU] via SUBCUTANEOUS
  Administered 2018-06-30: 5 [IU] via SUBCUTANEOUS

## 2018-06-28 MED ORDER — ASPIRIN EC 81 MG PO TBEC
81.0000 mg | DELAYED_RELEASE_TABLET | Freq: Every day | ORAL | Status: DC
  Administered 2018-06-29 – 2018-07-01 (×3): 81 mg via ORAL
  Filled 2018-06-28 (×3): qty 1

## 2018-06-28 MED ORDER — STROKE: EARLY STAGES OF RECOVERY BOOK
Freq: Once | Status: AC
  Administered 2018-06-28: 21:00:00

## 2018-06-28 MED ORDER — IOHEXOL 350 MG/ML SOLN
100.0000 mL | Freq: Once | INTRAVENOUS | Status: AC | PRN
  Administered 2018-06-28: 100 mL via INTRAVENOUS

## 2018-06-28 NOTE — ED Notes (Signed)
Called for Handoff report, receiving RN Had not reviewed report. Waiting for return call with questions.

## 2018-06-28 NOTE — ED Notes (Signed)
Please call Joron Velis for Pt update at [phone]

## 2018-06-28 NOTE — Telephone Encounter (Signed)
Called patient he how his blood sugars were doing today. He reports that they are doing significantly better in his CBG is down to 234.   He does state that he feels very different today. He is concerned he may be having a stroke. He describes new left-sided numbness and left lower extremity weakness. He states that he almost fell because his left leg is not working. He has had a stroke in the past and it affected his right side. He states these are all new symptoms.  His gonna call 911 and be brought to the emergency department for further evaluation.

## 2018-06-28 NOTE — ED Notes (Signed)
Patient transported to MRI 

## 2018-06-28 NOTE — ED Notes (Signed)
Pt came to door standing and needing to pee very badly.  This tech assisted the pt with a urinal.  The pt successfully stood and used the urinal.  Pt is back in bed and reconnected. Urine sample at bedside.

## 2018-06-28 NOTE — H&P (Addendum)
Date: 06/28/2018               Patient Name:  Mark Cabrera MRN: [id number]  DOB: October 24, 1950 Age / Sex: 68 y.o., male   PCP: Valinda Party, DO         Medical Service: Internal Medicine Teaching Service         Attending Physician: Dr. Sid Falcon, MD    First Contact: Dr. Sharon Seller Pager: [phone]  Second Contact: Dr. Berline Lopes Pager: [phone]       After Hours (After 5p/  First Contact Pager: [phone]  weekends / holidays): Second Contact Pager: [phone]   Chief Complaint: left-sided weakness  History of Present Illness: Mr. 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 He was unable to screw the cap off his toothpaste and he fell multiple times trying to walk. He had a mild, dull headache when he went to bed last night, but no other symptoms. He experienced nausea earlier today, but that has resolved. He denies dysphagia, confusion, vision changes, fevers, chest pain, dyspnea. He reports that his left-sided weakness has improved greatly since arriving to the hospital, and he was able to walk from his hospital bed to the door without falling. He takes a daily aspirin. He is not on a statin because in the past statins made him have back pain and "weird dreams."   Upon arrival to the ED, the patient was afebrile and normotensive. Labs significant for hyperglycemia to 196, but CBC and CMP were otherwise normal. CT head without abnormality. Brain MRI showed small acute infarcts in the right temporal and occipital lobes. CTA head and neck  showed proximal right P2 segment occlusion with corresponding areas of delayed perfusion and bilateral carotid artery atherosclerosis.  Meds:  No current facility-administered medications on file prior to encounter.    Current Outpatient Medications on File Prior to Encounter  Medication Sig Dispense Refill  . acetaminophen (TYLENOL) 325 MG tablet Take 2 tablets (650 mg total) by mouth every 4 (four) hours as needed for headache or mild pain.    . Ascorbic Acid (VITAMIN C PO) Take 1 tablet by mouth daily.    Marland Kitchen aspirin EC 81 MG tablet Take 1 tablet (81 mg total) by mouth daily. 90 tablet 3  . BAYER MICROLET LANCETS lancets Check blood sugar 3 times a day as instructed 100 each 12  . carvedilol (COREG) 3.125 MG tablet TAKE 1 TABLET BY MOUTH TWICE DAILY WITH A MEAL 180 tablet 3  . furosemide (LASIX) 40 MG tablet TAKE 1 TABLET BY MOUTH ONCE DAILY IN THE MORNING 2 TABLETS EVERY NIGHT. PLEASE KEEP UPCOMING APPT IN NOVEMBER. 270 tablet 3  . gabapentin (NEURONTIN) 100 MG capsule Take 1 capsule (100 mg total) by mouth 3 (three) times daily. 270 capsule 2  . glucose blood (CONTOUR NEXT TEST) test strip Check blood sugar 3 times a day as instructed 100 each 12  . ibuprofen (ADVIL,MOTRIN) 200 MG tablet Take 400 mg by mouth every 6 (six) hours as needed for headache or moderate pain.    Marland Kitchen  insulin glargine (LANTUS) 100 UNIT/ML injection Inject 50 Units into the skin daily.    . insulin lispro (HUMALOG) 100 UNIT/ML cartridge Inject 10 Units into the skin 2 (two) times daily with a meal. Please take 10U twice daily with your two largest meals at least four hours apart    . Insulin Pen Needle (PEN NEEDLES) 31G X 5 MM MISC 1 each by Does not apply route 3 (three) times daily. 100 each 12  . nitroGLYCERIN (NITROSTAT) 0.4 MG SL tablet Place 1 tablet (0.4 mg total) under the tongue every 5 (five) minutes as needed for chest pain. 25 tablet 1  . potassium chloride SA (K-DUR,KLOR-CON) 20 MEQ tablet TAKE 1 TABLET BY  MOUTH ONCE DAILY 90 tablet 3  . rosuvastatin (CRESTOR) 10 MG tablet Take 1 tablet (10 mg total) by mouth daily. 90 tablet 3   Allergies: Allergies as of 06/28/2018 - Review Complete 04/12/2018  Allergen Reaction Noted  . Penicillins Other (See Comments)   . Statins Other (See Comments) 04/18/2016  . Morphine and related Other (See Comments) 04/18/2016  . Sglt2 inhibitors Rash 04/12/2018   Past Medical History:  Diagnosis Date  . Acute diastolic congestive heart failure ([place])   . Aortic valve stenosis s/p AVR 2018  . Atrial fibrillation ([place]) - post-op CABG    04/2016 CHA2DS2VAS score = 5  . Atypical nevi   . Coronary artery disease s/p 2 vessel CABG   . Depression    "years ago"  . Diabetes mellitus   . Dyspnea    in the past   . GERD (gastroesophageal reflux disease)   . Heart murmur   . Hepatic cirrhosis ([place]) 06/29/2017  . History of kidney stones   . Hyperlipidemia    hx of transaminitis secondary to statin and he has decided not to use statins secondary to potential side effects.  . Hypertension   . Nephrolithiasis   . Osteoarthritis, knee   . Sleep apnea     Central apnea. Not using cpap  . Stroke [facility]) 2007  . Transaminitis     Statin-induced  . 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 2-D echo was normal no embolic source found.   Past Surgical History:  Procedure Laterality Date  . AORTIC VALVE REPLACEMENT N/A 03/30/2016   Procedure: AORTIC VALVE REPLACEMENT (AVR);  Surgeon: [name], MD;  Location: [place];  Service: Open Heart Surgery;  Laterality: N/A;  . CARDIAC CATHETERIZATION N/A 03/15/2016   Procedure: Right/Left Heart Cath and Coronary Angiography;  Surgeon: Burnell Blanks, MD;  Location: [place] [facility];  Service: Cardiovascular;  Laterality: N/A;  . CLIPPING OF ATRIAL APPENDAGE N/A 03/30/2016   Procedure: CLIPPING OF LEFT ATRIAL APPENDAGE;  Surgeon: [name], MD;  Location: [place];  Service: Open Heart Surgery;  Laterality: N/A;  . CORONARY ARTERY BYPASS GRAFT N/A 03/30/2016   Procedure: CORONARY ARTERY BYPASS GRAFTING (CABG) Times Two;  Surgeon: [name], MD;  Location: [place];  Service: Open Heart Surgery;  Laterality: N/A;  . KNEE ARTHROSCOPY W/ PARTIAL MEDIAL MENISCECTOMY  05/12/2005   right, performed by Dr. French Ana for torn medial meniscus.  Marland Kitchen ROTATOR CUFF REPAIR Right 2016  . STERNAL WIRES REMOVAL N/A 08/13/2017  Procedure: STERNAL WIRES REMOVAL;  Surgeon: [name], MD;  Location: [place];  Service: Thoracic;  Laterality: N/A;  . SUBXYPHOID PERICARDIAL WINDOW N/A 04/21/2016   Procedure: SUBXYPHOID PERICARDIAL WINDOW;  Surgeon: [name], MD;  Location: [place];  Service: Thoracic;  Laterality: N/A;  . TEE WITHOUT CARDIOVERSION N/A 03/30/2016   Procedure: TRANSESOPHAGEAL ECHOCARDIOGRAM (TEE);  Surgeon: [name], MD;  Location: [place];  Service: Open Heart Surgery;  Laterality: N/A;  . TEE WITHOUT CARDIOVERSION N/A 04/21/2016   Procedure: TRANSESOPHAGEAL ECHOCARDIOGRAM (TEE);  Surgeon: [name], MD;  Location: [facility] OR;  Service: Thoracic;  Laterality: N/A;    Family History:  Family History  Problem Relation Age of Onset  . Hypertension Mother   . Diabetes Mother   . Alcohol abuse Father    Social History: Lives alone. Functionally independent. Never smoker. Drinks 1 beer per week. Denies illicit drug use.   Review of Systems: A complete ROS was negative except as per HPI.   Physical Exam: Blood pressure (!) 164/95, pulse 62, temperature 97.7 F (36.5 C), temperature source Oral, resp. rate 11, height 5\' 8"  (1.727 m), weight 94.6 kg, SpO2 100 %.  Constitutional: Well-developed,  well-nourished, and in no distress.  Neuro: Alert and oriented x3. CNII-XII intact. 5/5 strength in all four extremities. Normal sensation throughout. No dysmetria or tremor on finger-to-nose.  Cardiovascular: Normal rate and regular rhythm. Systolic murmur.  Pulmonary/Chest: Effort normal. Clear to auscultation bilaterally. No wheezes, rales, or rhonchi. Abdominal: Bowel sounds present. Soft, non-distended, non-tender. Ext: No lower extremity edema. Skin: Warm and dry. No rashes or wounds.  EKG: personally reviewed my interpretation is NSR with LBBB and prolonged PR.   MRI Brain 1. Small acute infarcts in the mesial right temporal lobe and right occipital lobe. 2. Chronic left basal ganglia lacunar infarct, new from 2014.  CTA head and neck  1. Occlusion proximal right P2 segment. Corresponding areas of delayed perfusion in the right posterior cerebral artery involving the right hippocampus, thalamus, and right occipital lobe. MRI recommended to evaluate for acute infarct in these areas. 2. Mild atherosclerotic disease in the carotid artery bilaterally 3. Dominant right vertebral artery with mild-to-moderate stenosis proximally and mild stenosis distally. Non dominant left vertebral artery with occlusion left V4 segment.  Assessment & Plan by Problem: Active Problems:   CVA (cerebral vascular accident) [facility])  Mr. 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 Brain MRI showed acute infarcts in the right temporal and occipital lobes. CTA head showed occlusion of the proximal right P2 segment. He was outside the tpa  window on arrival to the hospital and was admitted for further evaluation and management.   CVA: Initially presented with left-sided tingling and weakness. His symptoms have now improved. Patient has multiple risk factors including prior CVA, DMII, HTN, HLD, and CAD. He takes a daily aspirin, but is not on a statin. He reports backaches and abnormal dreams with statins, which he has tried in the past. His chart reports a history of statin-induced transaminitis in 2013. Chart review shows mildly elevated LFTs around this time. He may warrant a statin re-trial given his high risk for recurrent stroke.  He has a history of afib as well, but this was post-operative following his CABG. He was treated with warfarin a few months, but this was stopped in 2018 because the arrhythmia did not recur.  - Neurology consulted, appreciate recs - Tele - Continue home aspirin 81mg   - Consider statin trial - ECHO - A1c, lipid profile - PT/OT - May have diet as he passed bedside swallow eval - Permissive HTN for 11-91 hrs  HTN Diastolic HF CAD s/p CABG - BP ranging from 118/70 to 164/95 since arrival to the hospital. Euvolemic on exam. - Holding home coreg 3.125mg  BID and lasix (40mg  qam and 80mg  qpm) for permissive HTN. - Continue home aspirin  DMII: A1c was 7.6 three months ago. Home regimen includes lantus 25u BID and humalog 15u TID with meals. Patient reports uncontrolled hyperglycemia for the past month.  - A1c - Lantus 15u BID - Novolog 8u TID with meals - SSI - Continue home gabapentin 100mg  TID  Hepatic cirrhosis: Hepatic cirrhosis incidentally found on chest CT in 2018. Likely NAFLD in the setting of HLD and DMII. Negative hepatitis serologies and normal ferritin. LFTs normal on CMP today.    FEN: no IV fluids, HH/CM diet, replace electrolytes as needed  DVT ppx: Lovenox Code status: DNR limited. No CPR or defibrillation. Okay with intubation.   Dispo: Admit patient to Inpatient with expected  length of stay greater than 2 midnights.  Signed: Corinne Ports, MD 06/28/2018, 8:28 PM  Pager: [phone]

## 2018-06-28 NOTE — ED Triage Notes (Addendum)
Pt brought in by [facility] from home for left leg weakness and difficulty with ambulation that started at 0800 this am. Pt has hx of stroke that left pt with right sided weakness. Pt endorses intermittent HA x1 week. Pt also endorses intermittent dizziness. Pt A+Ox4. EDP Floyd at bedside. Pt VAN positive. Pt has sensory deficits to left side.

## 2018-06-28 NOTE — ED Notes (Signed)
ED TO INPATIENT HANDOFF REPORT  ED Nurse Name and Phone #: Caprice Kluver [phone]  S Name/Age/Gender Mark Cabrera 68 y.o. male Room/Bed: 019C/019C  Code Status   Code Status: Prior  Home/SNF/Other Home Patient oriented to: self, place, time and situation Is this baseline? Yes   Triage Complete: Triage complete  Chief Complaint GAIT PROBLEM  Triage Note Pt brought in by [facility] from home for left leg weakness and difficulty with ambulation that started at 0800 this am. Pt has hx of stroke that left pt with right sided weakness. Pt endorses intermittent HA x1 week. Pt also endorses intermittent dizziness. Pt A+Ox4. EDP Floyd at bedside. Pt VAN positive. Pt has sensory deficits to left side.   Allergies Allergies  Allergen Reactions  . Penicillins Other (See Comments)    UNSPECIFIED REACTIONS  Has patient had a PCN reaction causing immediate rash, facial/tongue/throat swelling, SOB or lightheadedness with hypotension: Unk Has patient had a PCN reaction causing severe rash involving mucus membranes or skin necrosis: Unk Has patient had a PCN reaction that required hospitalization: Unk Has patient had a PCN reaction occurring within the last 10 years: No If all of the above answers are "NO", then may proceed with Cephalosporin use.   . Statins Other (See Comments)    UNSPECIFIED REACTION   . Morphine And Related Other (See Comments)    hallucinations  . Sglt2 Inhibitors Rash    Candidiasis infection prone    Level of Care/Admitting Diagnosis ED Disposition    ED Disposition Condition [facility] Area: [place] [[id number]]  Level of Care: Telemetry Medical [104]  Covid Evaluation: N/A  Diagnosis: CVA (cerebral vascular accident) [facility]) [[id number]]  Admitting Physician: Sid Falcon [phone]  Attending Physician: Cresenciano Lick  Estimated length of stay: past midnight tomorrow  Certification:: I certify this patient will need inpatient  services for at least 2 midnights  PT Class (Do Not Modify): Inpatient [101]  PT Acc Code (Do Not Modify): Private [1]       B Medical/Surgery History Past Medical History:  Diagnosis Date  . Acute diastolic congestive heart failure ([place])   . Aortic valve stenosis s/p AVR 2018  . Atrial fibrillation ([place]) - post-op CABG    04/2016 CHA2DS2VAS score = 5  . Atypical nevi   . Coronary artery disease s/p 2 vessel CABG   . Depression    "years ago"  . Diabetes mellitus   . Dyspnea    in the past   . GERD (gastroesophageal reflux disease)   . Heart murmur   . Hepatic cirrhosis ([place]) 06/29/2017  . History of kidney stones   . Hyperlipidemia    hx of transaminitis secondary to statin and he has decided not to use statins secondary to potential side effects.  . Hypertension   . Nephrolithiasis   . Osteoarthritis, knee   . Sleep apnea     Central apnea. Not using cpap  . Stroke [facility]) 2007  . Transaminitis     Statin-induced  . 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 2-D echo was normal no embolic source found.   Past Surgical History:  Procedure Laterality Date  . AORTIC VALVE REPLACEMENT N/A 03/30/2016  Procedure: AORTIC VALVE REPLACEMENT (AVR);  Surgeon: [name], MD;  Location: [place];  Service: Open Heart Surgery;  Laterality: N/A;  . CARDIAC CATHETERIZATION N/A 03/15/2016   Procedure: Right/Left Heart Cath and Coronary Angiography;  Surgeon: Burnell Blanks, MD;  Location: [place] [facility];  Service: Cardiovascular;  Laterality: N/A;  . CLIPPING OF ATRIAL APPENDAGE N/A 03/30/2016   Procedure: CLIPPING OF LEFT ATRIAL APPENDAGE;  Surgeon: [name], MD;  Location: [facility];   Service: Open Heart Surgery;  Laterality: N/A;  . CORONARY ARTERY BYPASS GRAFT N/A 03/30/2016   Procedure: CORONARY ARTERY BYPASS GRAFTING (CABG) Times Two;  Surgeon: [name], MD;  Location: [place];  Service: Open Heart Surgery;  Laterality: N/A;  . KNEE ARTHROSCOPY W/ PARTIAL MEDIAL MENISCECTOMY  05/12/2005   right, performed by Dr. French Ana for torn medial meniscus.  Marland Kitchen ROTATOR CUFF REPAIR Right 2016  . STERNAL WIRES REMOVAL N/A 08/13/2017   Procedure: STERNAL WIRES REMOVAL;  Surgeon: [name], MD;  Location: [place];  Service: Thoracic;  Laterality: N/A;  . SUBXYPHOID PERICARDIAL WINDOW N/A 04/21/2016   Procedure: SUBXYPHOID PERICARDIAL WINDOW;  Surgeon: [name], MD;  Location: [place];  Service: Thoracic;  Laterality: N/A;  . TEE WITHOUT CARDIOVERSION N/A 03/30/2016   Procedure: TRANSESOPHAGEAL ECHOCARDIOGRAM (TEE);  Surgeon: [name], MD;  Location: [place];  Service: Open Heart Surgery;  Laterality: N/A;  . TEE WITHOUT CARDIOVERSION N/A 04/21/2016   Procedure: TRANSESOPHAGEAL ECHOCARDIOGRAM (TEE);  Surgeon: [name], MD;  Location: [place];  Service: Thoracic;  Laterality: N/A;     A IV Location/Drains/Wounds Patient Lines/Drains/Airways Status   Active Line/Drains/Airways    Name:   Placement date:   Placement time:   Site:   Days:   Incision (Closed) 03/30/16 Sternum Anterior;Mid   03/30/16    1011     820   Incision (Closed) 03/30/16 Leg Right   03/30/16    1011     820   Incision (Closed) 04/21/16 Chest Other (Comment)   04/21/16    1715     798   Incision (Closed) 04/21/16 Chest Right   04/21/16    1715     798   Incision (Closed) 08/13/17 Chest Other (Comment)   08/13/17    1536     319          Intake/Output Last 24 hours  Intake/Output Summary (Last 24 hours) at 06/28/2018 1945 Last data filed at 06/28/2018 1840 Gross per 24 hour  Intake -  Output 800 ml  Net -800 ml    Labs/Imaging Results for orders placed or  performed during the hospital encounter of 06/28/18 (from the past 48 hour(s))  CBG monitoring, ED     Status: Abnormal   Collection Time: 06/28/18  4:44 PM  Result Value Ref Range   Glucose-Capillary 186 (H) 70 - 99 mg/dL  Protime-INR     Status: None   Collection Time: 06/28/18  4:58 PM  Result Value Ref Range   Prothrombin Time 13.6 11.4 - 15.2 seconds   INR 1.1 0.8 - 1.2    Comment: (NOTE) INR goal varies based on device and disease states. Performed at [facility], [place] [address], [place], [place] [address]   APTT     Status: None   Collection Time: 06/28/18  4:58 PM  Result Value Ref Range   aPTT 28 24 - 36 seconds    Comment: Performed at [facility], [place] [address], [place], [place] [address]  CBC     Status: None   Collection Time: 06/28/18  4:58 PM  Result Value Ref Range   WBC 4.8 4.0 - 10.5 K/uL   RBC 4.76 4.22 - 5.81 MIL/uL   Hemoglobin 13.7 13.0 - 17.0 g/dL   HCT 41.7 39.0 - 52.0 %   MCV 87.6 80.0 - 100.0 fL   MCH 28.8 26.0 - 34.0 pg   MCHC 32.9 30.0 - 36.0 g/dL   RDW 12.5 11.5 - 15.5 %   Platelets 226 150 - 400 K/uL   nRBC 0.0 0.0 - 0.2 %    Comment: Performed at [facility], [place] [address], [place], [place] [address]  Differential     Status: None   Collection Time: 06/28/18  4:58 PM  Result Value Ref Range   Neutrophils Relative % 60 %   Neutro Abs 2.9 1.7 - 7.7 K/uL   Lymphocytes Relative 33 %   Lymphs Abs 1.6 0.7 - 4.0 K/uL   Monocytes Relative 5 %   Monocytes Absolute 0.2 0.1 - 1.0 K/uL   Eosinophils Relative 2 %   Eosinophils Absolute 0.1 0.0 - 0.5 K/uL   Basophils Relative 0 %   Basophils Absolute 0.0 0.0 - 0.1 K/uL   Immature Granulocytes 0 %   Abs Immature Granulocytes 0.02 0.00 - 0.07 K/uL    Comment: Performed at [facility] [address], [place], [place] [address]  Comprehensive metabolic panel     Status: Abnormal   Collection Time: 06/28/18  4:58 PM  Result Value Ref Range   Sodium 138 135 - 145 mmol/L    Potassium 3.8 3.5 - 5.1 mmol/L   Chloride 102 98 - 111 mmol/L   CO2 25 22 - 32 mmol/L   Glucose, Bld 196 (H) 70 - 99 mg/dL   BUN 11 8 - 23 mg/dL   Creatinine, Ser 0.90 0.61 - 1.24 mg/dL   Calcium 9.5 8.9 - 10.3 mg/dL   Total Protein 7.2 6.5 - 8.1 g/dL   Albumin 3.9 3.5 - 5.0 g/dL   AST 27 15 - 41 U/L   ALT 25 0 - 44 U/L   Alkaline Phosphatase 81 38 - 126 U/L   Total Bilirubin 1.2 0.3 - 1.2 mg/dL   GFR calc non Af Amer >60 >60 mL/min   GFR calc Af Amer >60 >60 mL/min   Anion gap 11 5 - 15    Comment: Performed at [facility] [address] [place], [place] [address]   Ct Angio Head W Or Wo Contrast  Result Date: 06/28/2018 CLINICAL DATA:  Acute stroke. Partial field cut. Left-sided weakness. EXAM: CT ANGIOGRAPHY HEAD AND NECK CT PERFUSION BRAIN TECHNIQUE: Multidetector CT imaging of the head and neck was performed using the standard protocol during bolus administration of intravenous contrast. Multiplanar CT image reconstructions and MIPs were obtained to evaluate the vascular anatomy. Carotid stenosis measurements (when applicable) are obtained utilizing NASCET criteria, using the distal internal carotid diameter as the denominator. Multiphase CT imaging of the brain was performed following IV bolus contrast injection. Subsequent parametric perfusion maps were calculated using RAPID software. CONTRAST:  158mL OMNIPAQUE IOHEXOL 350 MG/ML SOLN COMPARISON:  CT head 06/28/2018 FINDINGS: Review of the MIP images confirms the above findings CTA NECK FINDINGS Aortic arch: Mild atherosclerotic disease aortic arch without aneurysm or dissection. Atherosclerotic disease proximal left subclavian artery. Proximal great vessels widely patent. Right  carotid system: Atherosclerotic calcification proximal right carotid bifurcation and internal carotid artery with approximately 25% diameter stenosis right internal carotid artery. Left carotid system: Atherosclerotic calcification left carotid bifurcation  without significant stenosis Vertebral arteries: Right vertebral artery dominant. Mild to moderate calcific stenosis proximal right vertebral artery. Mild stenosis distal right vertebral artery due to calcific stenosis. Non dominant left vertebral artery occludes V4 segment. Skeleton: Negative Other neck: No acute abnormality Upper chest: Negative Review of the MIP images confirms the above findings CTA HEAD FINDINGS Anterior circulation: Atherosclerotic calcification cavernous carotid bilaterally with mild stenosis bilaterally. Anterior and middle cerebral arteries patent bilaterally without significant stenosis. Posterior circulation: Mild stenosis right V3 segment due to calcific disease. Right PICA patent. Small left vertebral artery supplies PICA. Occlusion of the left V4 segment. Occlusion proximal right P2 segment. Right superior cerebellar artery widely patent. Basilar widely patent. Left superior cerebellar and left posterior cerebral artery widely patent. Venous sinuses: Patent Anatomic variants: None Delayed phase: Not performed Review of the MIP images confirms the above findings CT Brain Perfusion Findings: ASPECTS: 10 CBF (<30%) Volume: 19mL Perfusion (Tmax>6.0s) volume: 79mL Mismatch Volume: 55mL Infarction Location:Small areas of delayed perfusion in the right hippocampus, right occipital lobe, right thalamus corresponding to the right P2 occlusion. IMPRESSION: 1. Occlusion proximal right P2 segment. Corresponding areas of delayed perfusion in the right posterior cerebral artery involving the right hippocampus, thalamus, and right occipital lobe. MRI recommended to evaluate for acute infarct in these areas. 2. Mild atherosclerotic disease in the carotid artery bilaterally 3. Dominant right vertebral artery with mild-to-moderate stenosis proximally and mild stenosis distally. Non dominant left vertebral artery with occlusion left V4 segment. 4. These results were called by telephone at the time of  interpretation on 06/28/2018 at 5:38 pm to Dr. Roland Rack , who verbally acknowledged these results. Electronically Signed   By: Franchot Gallo M.D.   On: 06/28/2018 17:40   Ct Angio Neck W Or Wo Contrast  Result Date: 06/28/2018 CLINICAL DATA:  Acute stroke. Partial field cut. Left-sided weakness. EXAM: CT ANGIOGRAPHY HEAD AND NECK CT PERFUSION BRAIN TECHNIQUE: Multidetector CT imaging of the head and neck was performed using the standard protocol during bolus administration of intravenous contrast. Multiplanar CT image reconstructions and MIPs were obtained to evaluate the vascular anatomy. Carotid stenosis measurements (when applicable) are obtained utilizing NASCET criteria, using the distal internal carotid diameter as the denominator. Multiphase CT imaging of the brain was performed following IV bolus contrast injection. Subsequent parametric perfusion maps were calculated using RAPID software. CONTRAST:  160mL OMNIPAQUE IOHEXOL 350 MG/ML SOLN COMPARISON:  CT head 06/28/2018 FINDINGS: Review of the MIP images confirms the above findings CTA NECK FINDINGS Aortic arch: Mild atherosclerotic disease aortic arch without aneurysm or dissection. Atherosclerotic disease proximal left subclavian artery. Proximal great vessels widely patent. Right carotid system: Atherosclerotic calcification proximal right carotid bifurcation and internal carotid artery with approximately 25% diameter stenosis right internal carotid artery. Left carotid system: Atherosclerotic calcification left carotid bifurcation without significant stenosis Vertebral arteries: Right vertebral artery dominant. Mild to moderate calcific stenosis proximal right vertebral artery. Mild stenosis distal right vertebral artery due to calcific stenosis. Non dominant left vertebral artery occludes V4 segment. Skeleton: Negative Other neck: No acute abnormality Upper chest: Negative Review of the MIP images confirms the above findings CTA HEAD  FINDINGS Anterior circulation: Atherosclerotic calcification cavernous carotid bilaterally with mild stenosis bilaterally. Anterior and middle cerebral arteries patent bilaterally without significant stenosis. Posterior circulation: Mild stenosis right V3 segment due  to calcific disease. Right PICA patent. Small left vertebral artery supplies PICA. Occlusion of the left V4 segment. Occlusion proximal right P2 segment. Right superior cerebellar artery widely patent. Basilar widely patent. Left superior cerebellar and left posterior cerebral artery widely patent. Venous sinuses: Patent Anatomic variants: None Delayed phase: Not performed Review of the MIP images confirms the above findings CT Brain Perfusion Findings: ASPECTS: 10 CBF (<30%) Volume: 71mL Perfusion (Tmax>6.0s) volume: 85mL Mismatch Volume: 67mL Infarction Location:Small areas of delayed perfusion in the right hippocampus, right occipital lobe, right thalamus corresponding to the right P2 occlusion. IMPRESSION: 1. Occlusion proximal right P2 segment. Corresponding areas of delayed perfusion in the right posterior cerebral artery involving the right hippocampus, thalamus, and right occipital lobe. MRI recommended to evaluate for acute infarct in these areas. 2. Mild atherosclerotic disease in the carotid artery bilaterally 3. Dominant right vertebral artery with mild-to-moderate stenosis proximally and mild stenosis distally. Non dominant left vertebral artery with occlusion left V4 segment. 4. These results were called by telephone at the time of interpretation on 06/28/2018 at 5:38 pm to Dr. Roland Rack , who verbally acknowledged these results. Electronically Signed   By: Franchot Gallo M.D.   On: 06/28/2018 17:40   Ct Cerebral Perfusion W Contrast  Result Date: 06/28/2018 CLINICAL DATA:  Acute stroke. Partial field cut. Left-sided weakness. EXAM: CT ANGIOGRAPHY HEAD AND NECK CT PERFUSION BRAIN TECHNIQUE: Multidetector CT imaging of the head  and neck was performed using the standard protocol during bolus administration of intravenous contrast. Multiplanar CT image reconstructions and MIPs were obtained to evaluate the vascular anatomy. Carotid stenosis measurements (when applicable) are obtained utilizing NASCET criteria, using the distal internal carotid diameter as the denominator. Multiphase CT imaging of the brain was performed following IV bolus contrast injection. Subsequent parametric perfusion maps were calculated using RAPID software. CONTRAST:  139mL OMNIPAQUE IOHEXOL 350 MG/ML SOLN COMPARISON:  CT head 06/28/2018 FINDINGS: Review of the MIP images confirms the above findings CTA NECK FINDINGS Aortic arch: Mild atherosclerotic disease aortic arch without aneurysm or dissection. Atherosclerotic disease proximal left subclavian artery. Proximal great vessels widely patent. Right carotid system: Atherosclerotic calcification proximal right carotid bifurcation and internal carotid artery with approximately 25% diameter stenosis right internal carotid artery. Left carotid system: Atherosclerotic calcification left carotid bifurcation without significant stenosis Vertebral arteries: Right vertebral artery dominant. Mild to moderate calcific stenosis proximal right vertebral artery. Mild stenosis distal right vertebral artery due to calcific stenosis. Non dominant left vertebral artery occludes V4 segment. Skeleton: Negative Other neck: No acute abnormality Upper chest: Negative Review of the MIP images confirms the above findings CTA HEAD FINDINGS Anterior circulation: Atherosclerotic calcification cavernous carotid bilaterally with mild stenosis bilaterally. Anterior and middle cerebral arteries patent bilaterally without significant stenosis. Posterior circulation: Mild stenosis right V3 segment due to calcific disease. Right PICA patent. Small left vertebral artery supplies PICA. Occlusion of the left V4 segment. Occlusion proximal right P2  segment. Right superior cerebellar artery widely patent. Basilar widely patent. Left superior cerebellar and left posterior cerebral artery widely patent. Venous sinuses: Patent Anatomic variants: None Delayed phase: Not performed Review of the MIP images confirms the above findings CT Brain Perfusion Findings: ASPECTS: 10 CBF (<30%) Volume: 42mL Perfusion (Tmax>6.0s) volume: 16mL Mismatch Volume: 43mL Infarction Location:Small areas of delayed perfusion in the right hippocampus, right occipital lobe, right thalamus corresponding to the right P2 occlusion. IMPRESSION: 1. Occlusion proximal right P2 segment. Corresponding areas of delayed perfusion in the right posterior cerebral artery involving the  right hippocampus, thalamus, and right occipital lobe. MRI recommended to evaluate for acute infarct in these areas. 2. Mild atherosclerotic disease in the carotid artery bilaterally 3. Dominant right vertebral artery with mild-to-moderate stenosis proximally and mild stenosis distally. Non dominant left vertebral artery with occlusion left V4 segment. 4. These results were called by telephone at the time of interpretation on 06/28/2018 at 5:38 pm to Dr. Roland Rack , who verbally acknowledged these results. Electronically Signed   By: Franchot Gallo M.D.   On: 06/28/2018 17:40   Ct Head Code Stroke Wo Contrast  Result Date: 06/28/2018 CLINICAL DATA:  Code stroke.  Left leg weakness EXAM: CT HEAD WITHOUT CONTRAST TECHNIQUE: Contiguous axial images were obtained from the base of the skull through the vertex without intravenous contrast. COMPARISON:  None. FINDINGS: Brain: No evidence of acute infarction, hemorrhage, hydrocephalus, extra-axial collection or mass lesion/mass effect. Vascular: Negative for hyperdense vessel Skull: Negative Sinuses/Orbits: Paranasal sinuses clear.  Bilateral cataract surgery Other: None ASPECTS ([place] Stroke Program Early CT Score) - Ganglionic level infarction (caudate, lentiform  nuclei, internal capsule, insula, M1-M3 cortex): 7 - Supraganglionic infarction (M4-M6 cortex): 3 Total score (0-10 with 10 being normal): 10 IMPRESSION: 1. No acute abnormality 2. ASPECTS is 10 Electronically Signed   By: Franchot Gallo M.D.   On: 06/28/2018 17:05    Pending Labs Unresulted Labs (From admission, onward)   None      Vitals/Pain Today's Vitals   06/28/18 1800 06/28/18 1815 06/28/18 1845 06/28/18 1935  BP: 113/71 112/69 (!) 159/71   Pulse: 62 61 (!) 59 62  Resp: 16 12 12 18   Temp:      TempSrc:      SpO2: 98% 99% 97% 100%  Weight:      Height:      PainSc:        Isolation Precautions No active isolations  Medications Medications  sodium chloride flush (NS) 0.9 % injection 3 mL (3 mLs Intravenous Not Given 06/28/18 1744)  iohexol (OMNIPAQUE) 350 MG/ML injection 100 mL (100 mLs Intravenous Contrast Given 06/28/18 1707)    Mobility walks High fall risk   Focused Assessments Neuro Assessment Handoff:  Swallow screen pass? Yes  Cardiac Rhythm: Normal sinus rhythm NIH Stroke Scale ( + Modified Stroke Scale Criteria)  Interval: Initial Level of Consciousness (1a.)   : Alert, keenly responsive LOC Questions (1b. )   +: Answers both questions correctly LOC Commands (1c. )   + : Performs both tasks correctly Best Gaze (2. )  +: Partial gaze palsy Visual (3. )  +: Partial hemianopia Facial Palsy (4. )    : Normal symmetrical movements Motor Arm, Left (5a. )   +: No drift Motor Arm, Right (5b. )   +: No drift Motor Leg, Left (6a. )   +: Drift Motor Leg, Right (6b. )   +: No drift Limb Ataxia (7. ): Absent Sensory (8. )   +: Mild-to-moderate sensory loss, patient feels pinprick is less sharp or is dull on the affected side, or there is a loss of superficial pain with pinprick, but patient is aware of being touched Best Language (9. )   +: No aphasia Dysarthria (10. ): Normal Extinction/Inattention (11.)   +: No Abnormality Modified SS Total  +: 4 Complete  NIHSS TOTAL: 4 Last date known well: 06/28/18 Last time known well: 0800 Neuro Assessment: Within Defined Limits Neuro Checks:   Initial (06/28/18 1652)  Last Documented NIHSS Modified Score: 4 (06/28/18 1852)  Has TPA been given? No If patient is a Neuro Trauma and patient is going to OR before floor call report to [place] nurse: [phone] or [phone]     R Recommendations: See Admitting Provider Note  Report given to:   Additional Notes:

## 2018-06-28 NOTE — ED Provider Notes (Signed)
[facility] EMERGENCY DEPARTMENT Provider Note   CSN: [id number] Arrival date & time: 06/28/18  1628    History   Chief Complaint Chief Complaint  Patient presents with   Weakness   Gait Problem    HPI [name] is a 68 y.o. male.     68 yo M with a chief complaint of left upper extremity weakness.  This started this morning about 8 AM.  Since then he has had significant weakness to that side.  He had had troubles with his blood sugar recently and just had a change in his medications yesterday.  His blood sugar was in the 230s when he called his doctor this morning was having the symptoms.  He did not improve and so he decided come to the ED.  He had a history of a left-sided stroke back in 2007.  He has chronic issues with unsteadiness as well as right paresthesias.  The history is provided by the patient.  Weakness  Associated symptoms: no abdominal pain, no arthralgias, no chest pain, no diarrhea, no fever, no headaches, no myalgias, no shortness of breath and no vomiting   Illness  Severity:  Severe Onset quality:  Sudden Duration:  7 hours Timing:  Constant Progression:  Unchanged Chronicity:  New Associated symptoms: no abdominal pain, no chest pain, no congestion, no diarrhea, no fever, no headaches, no myalgias, no rash, no shortness of breath and no vomiting     Past Medical History:  Diagnosis Date   Acute diastolic congestive heart failure (HCC)    Aortic valve stenosis s/p AVR 2018   Atrial fibrillation ([place]) - post-op CABG    04/2016 CHA2DS2VAS score = 5   Atypical nevi    Coronary artery disease s/p 2 vessel CABG    Depression    "years ago"   Diabetes mellitus    Dyspnea    in the past    GERD (gastroesophageal reflux disease)    Heart murmur    Hepatic cirrhosis ([place]) 06/29/2017   History of kidney stones    Hyperlipidemia    hx of transaminitis secondary to statin and he has decided not to use statins secondary  to potential side effects.   Hypertension    Nephrolithiasis    Osteoarthritis, knee    Sleep apnea     Central apnea. 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 2-D echo was normal no embolic source found.    Patient Active Problem List   Diagnosis Date Noted   Hepatic cirrhosis ([place]) 06/29/2017   Candida albicans infection 01/09/2017   Healthcare maintenance 12/16/2016   Lipoma of left upper extremity 09/12/2016   Chronic knee pain 07/04/2016   Decreased visual acuity 07/04/2016   Peripheral neuropathic pain 05/12/2016   Pericardial effusion a. 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   Dyslipidemia 05/18/2006   Hypertensive heart disease    History of dissection of vertebral artery  ([place]) 08/02/2005    Past Surgical History:  Procedure Laterality Date   AORTIC VALVE REPLACEMENT N/A 03/30/2016   Procedure: AORTIC VALVE REPLACEMENT (AVR);  Surgeon: [name], MD;  Location: [place];  Service: Open Heart Surgery;  Laterality: N/A;   CARDIAC CATHETERIZATION N/A 03/15/2016   Procedure: Right/Left Heart Cath and Coronary Angiography;  Surgeon: Burnell Blanks, MD;  Location: [place] [facility];  Service: Cardiovascular;   Laterality: N/A;   CLIPPING OF ATRIAL APPENDAGE N/A 03/30/2016   Procedure: CLIPPING OF LEFT ATRIAL APPENDAGE;  Surgeon: [name], MD;  Location: [place];  Service: Open Heart Surgery;  Laterality: N/A;   CORONARY ARTERY BYPASS GRAFT N/A 03/30/2016   Procedure: CORONARY ARTERY BYPASS GRAFTING (CABG) Times Two;  Surgeon: [name], MD;  Location: [place];  Service: Open Heart Surgery;  Laterality: N/A;   KNEE ARTHROSCOPY W/ PARTIAL MEDIAL MENISCECTOMY  05/12/2005   right, performed by Dr. French Ana for torn medial meniscus.   ROTATOR CUFF REPAIR Right 2016   STERNAL WIRES REMOVAL N/A 08/13/2017   Procedure: STERNAL WIRES REMOVAL;  Surgeon: [name], MD;  Location: [place];  Service: Thoracic;  Laterality: N/A;   SUBXYPHOID PERICARDIAL WINDOW N/A 04/21/2016   Procedure: SUBXYPHOID PERICARDIAL WINDOW;  Surgeon: [name], MD;  Location: [place];  Service: Thoracic;  Laterality: N/A;   TEE WITHOUT CARDIOVERSION N/A 03/30/2016   Procedure: TRANSESOPHAGEAL ECHOCARDIOGRAM (TEE);  Surgeon: [name], MD;  Location: [place];  Service: Open Heart Surgery;  Laterality: N/A;   TEE WITHOUT CARDIOVERSION N/A 04/21/2016   Procedure: TRANSESOPHAGEAL ECHOCARDIOGRAM (TEE);  Surgeon: [name], MD;  Location: [facility];  Service: Thoracic;  Laterality: N/A;        Home Medications    Prior to Admission medications   Medication Sig Start Date End Date Taking? Authorizing Provider  acetaminophen (TYLENOL) 325 MG tablet Take 2 tablets (650 mg total) by mouth every 4 (four) hours as needed for headache or mild pain. 04/28/16   Isaiah Serge, NP  Ascorbic Acid (VITAMIN C PO) Take 1 tablet by mouth daily.    [provider]  aspirin EC 81 MG tablet Take 1 tablet (81 mg total) by mouth daily. 05/31/16   Burnell Blanks, MD  BAYER MICROLET LANCETS lancets Check blood sugar 3 times a day as instructed 06/28/17   Kalman Shan Ratliff, DO    carvedilol (COREG) 3.125 MG tablet TAKE 1 TABLET BY MOUTH TWICE DAILY WITH A MEAL 02/11/18   Burnell Blanks, MD  furosemide (LASIX) 40 MG tablet TAKE 1 TABLET BY MOUTH ONCE DAILY IN THE MORNING 2 TABLETS EVERY NIGHT. PLEASE KEEP UPCOMING APPT IN NOVEMBER. 03/04/18   Burnell Blanks, MD  gabapentin (NEURONTIN) 100 MG capsule Take 1 capsule (100 mg total) by mouth 3 (three) times daily. 02/21/18   Kalman Shan Ratliff, DO  glucose blood (CONTOUR NEXT TEST) test strip Check blood sugar 3 times a day as instructed 06/17/18   Kalman Shan Ratliff, DO  ibuprofen (ADVIL,MOTRIN) 200 MG tablet Take 400 mg by mouth every 6 (six) hours as needed for headache or moderate pain.    [provider]  insulin glargine (LANTUS) 100 UNIT/ML injection Inject 50 Units into the skin daily.    [provider]  insulin lispro (HUMALOG) 100  UNIT/ML cartridge Inject 10 Units into the skin 2 (two) times daily with a meal. Please take 10U twice daily with your two largest meals at least four hours apart    [provider]  Insulin Pen Needle (PEN NEEDLES) 31G X 5 MM MISC 1 each by Does not apply route 3 (three) times daily. 06/28/17   Kalman Shan Ratliff, DO  nitroGLYCERIN (NITROSTAT) 0.4 MG SL tablet Place 1 tablet (0.4 mg total) under the tongue every 5 (five) minutes as needed for chest pain. 02/14/17   Jule Ser, DO  potassium chloride SA (K-DUR,KLOR-CON) 20 MEQ tablet TAKE 1 TABLET BY MOUTH ONCE DAILY 09/07/17   Burnell Blanks, MD  rosuvastatin (CRESTOR) 10 MG tablet Take 1 tablet (10 mg total) by mouth daily. 01/18/18   Burnell Blanks, MD    Family History Family History  Problem Relation Age of Onset   Hypertension Mother    Diabetes Mother    Alcohol abuse Father     Social History Social History   Tobacco Use   Smoking status: Never Smoker   Smokeless tobacco: Never Used  Substance Use Topics   Alcohol use: Yes    Comment:  occasional   Drug use: No     Allergies   Penicillins; Statins; Morphine and related; and Sglt2 inhibitors   Review of Systems Review of Systems  Constitutional: Negative for chills and fever.  HENT: Negative for congestion and facial swelling.   Eyes: Negative for discharge and visual disturbance.  Respiratory: Negative for shortness of breath.   Cardiovascular: Negative for chest pain and palpitations.  Gastrointestinal: Negative for abdominal pain, diarrhea and vomiting.  Musculoskeletal: Negative for arthralgias and myalgias.  Skin: Negative for color change and rash.  Neurological: Positive for weakness and numbness. Negative for tremors, syncope and headaches.  Psychiatric/Behavioral: Negative for confusion and dysphoric mood.     Physical Exam Updated Vital Signs BP (!) 159/71    Pulse (!) 59    Temp 97.7 F (36.5 C) (Oral)    Resp 12    Ht 5\' 8"  (1.727 m)    Wt 94.6 kg    SpO2 97%    BMI 31.71 kg/m   Physical Exam Vitals signs and nursing note reviewed.  Constitutional:      Appearance: He is well-developed.  HENT:     Head: Normocephalic and atraumatic.  Eyes:     Pupils: Pupils are equal, round, and reactive to light.  Neck:     Musculoskeletal: Normal range of motion and neck supple.     Vascular: No JVD.  Cardiovascular:     Rate and Rhythm: Normal rate and regular rhythm.     Heart sounds: No murmur. No friction rub. No gallop.   Pulmonary:     Effort: No respiratory distress.     Breath sounds: No wheezing.  Abdominal:     General: There is no distension.     Tenderness: There is no guarding or rebound.  Musculoskeletal: Normal range of motion.  Skin:    Coloration: Skin is not pale.     Findings: No rash.  Neurological:     Mental Status: He is alert and oriented to person, place, and time.     Comments: Significant left upper extremity weakness.  Mild left-sided facial droop.  Subjective decrease sensation to the left side.  He has a visual  field deficit on the left as well.  Psychiatric:        Behavior: Behavior  normal.      ED Treatments / Results  Labs (all labs ordered are listed, but only abnormal results are displayed) Labs Reviewed  COMPREHENSIVE METABOLIC PANEL - Abnormal; Notable for the following components:      Result Value   Glucose, Bld 196 (*)    All other components within normal limits  CBG MONITORING, ED - Abnormal; Notable for the following components:   Glucose-Capillary 186 (*)    All other components within normal limits  PROTIME-INR  APTT  CBC  DIFFERENTIAL    EKG None  Radiology Ct Angio Head W Or Wo Contrast  Result Date: 06/28/2018 CLINICAL DATA:  Acute stroke. Partial field cut. Left-sided weakness. EXAM: CT ANGIOGRAPHY HEAD AND NECK CT PERFUSION BRAIN TECHNIQUE: Multidetector CT imaging of the head and neck was performed using the standard protocol during bolus administration of intravenous contrast. Multiplanar CT image reconstructions and MIPs were obtained to evaluate the vascular anatomy. Carotid stenosis measurements (when applicable) are obtained utilizing NASCET criteria, using the distal internal carotid diameter as the denominator. Multiphase CT imaging of the brain was performed following IV bolus contrast injection. Subsequent parametric perfusion maps were calculated using RAPID software. CONTRAST:  184mL OMNIPAQUE IOHEXOL 350 MG/ML SOLN COMPARISON:  CT head 06/28/2018 FINDINGS: Review of the MIP images confirms the above findings CTA NECK FINDINGS Aortic arch: Mild atherosclerotic disease aortic arch without aneurysm or dissection. Atherosclerotic disease proximal left subclavian artery. Proximal great vessels widely patent. Right carotid system: Atherosclerotic calcification proximal right carotid bifurcation and internal carotid artery with approximately 25% diameter stenosis right internal carotid artery. Left carotid system: Atherosclerotic calcification left carotid  bifurcation without significant stenosis Vertebral arteries: Right vertebral artery dominant. Mild to moderate calcific stenosis proximal right vertebral artery. Mild stenosis distal right vertebral artery due to calcific stenosis. Non dominant left vertebral artery occludes V4 segment. Skeleton: Negative Other neck: No acute abnormality Upper chest: Negative Review of the MIP images confirms the above findings CTA HEAD FINDINGS Anterior circulation: Atherosclerotic calcification cavernous carotid bilaterally with mild stenosis bilaterally. Anterior and middle cerebral arteries patent bilaterally without significant stenosis. Posterior circulation: Mild stenosis right V3 segment due to calcific disease. Right PICA patent. Small left vertebral artery supplies PICA. Occlusion of the left V4 segment. Occlusion proximal right P2 segment. Right superior cerebellar artery widely patent. Basilar widely patent. Left superior cerebellar and left posterior cerebral artery widely patent. Venous sinuses: Patent Anatomic variants: None Delayed phase: Not performed Review of the MIP images confirms the above findings CT Brain Perfusion Findings: ASPECTS: 10 CBF (<30%) Volume: 37mL Perfusion (Tmax>6.0s) volume: 27mL Mismatch Volume: 45mL Infarction Location:Small areas of delayed perfusion in the right hippocampus, right occipital lobe, right thalamus corresponding to the right P2 occlusion. IMPRESSION: 1. Occlusion proximal right P2 segment. Corresponding areas of delayed perfusion in the right posterior cerebral artery involving the right hippocampus, thalamus, and right occipital lobe. MRI recommended to evaluate for acute infarct in these areas. 2. Mild atherosclerotic disease in the carotid artery bilaterally 3. Dominant right vertebral artery with mild-to-moderate stenosis proximally and mild stenosis distally. Non dominant left vertebral artery with occlusion left V4 segment. 4. These results were called by telephone at the  time of interpretation on 06/28/2018 at 5:38 pm to Dr. Roland Rack , who verbally acknowledged these results. Electronically Signed   By: Franchot Gallo M.D.   On: 06/28/2018 17:40   Ct Angio Neck W Or Wo Contrast  Result Date: 06/28/2018 CLINICAL DATA:  Acute stroke. Partial field cut.  Left-sided weakness. EXAM: CT ANGIOGRAPHY HEAD AND NECK CT PERFUSION BRAIN TECHNIQUE: Multidetector CT imaging of the head and neck was performed using the standard protocol during bolus administration of intravenous contrast. Multiplanar CT image reconstructions and MIPs were obtained to evaluate the vascular anatomy. Carotid stenosis measurements (when applicable) are obtained utilizing NASCET criteria, using the distal internal carotid diameter as the denominator. Multiphase CT imaging of the brain was performed following IV bolus contrast injection. Subsequent parametric perfusion maps were calculated using RAPID software. CONTRAST:  145mL OMNIPAQUE IOHEXOL 350 MG/ML SOLN COMPARISON:  CT head 06/28/2018 FINDINGS: Review of the MIP images confirms the above findings CTA NECK FINDINGS Aortic arch: Mild atherosclerotic disease aortic arch without aneurysm or dissection. Atherosclerotic disease proximal left subclavian artery. Proximal great vessels widely patent. Right carotid system: Atherosclerotic calcification proximal right carotid bifurcation and internal carotid artery with approximately 25% diameter stenosis right internal carotid artery. Left carotid system: Atherosclerotic calcification left carotid bifurcation without significant stenosis Vertebral arteries: Right vertebral artery dominant. Mild to moderate calcific stenosis proximal right vertebral artery. Mild stenosis distal right vertebral artery due to calcific stenosis. Non dominant left vertebral artery occludes V4 segment. Skeleton: Negative Other neck: No acute abnormality Upper chest: Negative Review of the MIP images confirms the above findings CTA  HEAD FINDINGS Anterior circulation: Atherosclerotic calcification cavernous carotid bilaterally with mild stenosis bilaterally. Anterior and middle cerebral arteries patent bilaterally without significant stenosis. Posterior circulation: Mild stenosis right V3 segment due to calcific disease. Right PICA patent. Small left vertebral artery supplies PICA. Occlusion of the left V4 segment. Occlusion proximal right P2 segment. Right superior cerebellar artery widely patent. Basilar widely patent. Left superior cerebellar and left posterior cerebral artery widely patent. Venous sinuses: Patent Anatomic variants: None Delayed phase: Not performed Review of the MIP images confirms the above findings CT Brain Perfusion Findings: ASPECTS: 10 CBF (<30%) Volume: 27mL Perfusion (Tmax>6.0s) volume: 57mL Mismatch Volume: 2mL Infarction Location:Small areas of delayed perfusion in the right hippocampus, right occipital lobe, right thalamus corresponding to the right P2 occlusion. IMPRESSION: 1. Occlusion proximal right P2 segment. Corresponding areas of delayed perfusion in the right posterior cerebral artery involving the right hippocampus, thalamus, and right occipital lobe. MRI recommended to evaluate for acute infarct in these areas. 2. Mild atherosclerotic disease in the carotid artery bilaterally 3. Dominant right vertebral artery with mild-to-moderate stenosis proximally and mild stenosis distally. Non dominant left vertebral artery with occlusion left V4 segment. 4. These results were called by telephone at the time of interpretation on 06/28/2018 at 5:38 pm to Dr. Roland Rack , who verbally acknowledged these results. Electronically Signed   By: Franchot Gallo M.D.   On: 06/28/2018 17:40   Ct Cerebral Perfusion W Contrast  Result Date: 06/28/2018 CLINICAL DATA:  Acute stroke. Partial field cut. Left-sided weakness. EXAM: CT ANGIOGRAPHY HEAD AND NECK CT PERFUSION BRAIN TECHNIQUE: Multidetector CT imaging of the  head and neck was performed using the standard protocol during bolus administration of intravenous contrast. Multiplanar CT image reconstructions and MIPs were obtained to evaluate the vascular anatomy. Carotid stenosis measurements (when applicable) are obtained utilizing NASCET criteria, using the distal internal carotid diameter as the denominator. Multiphase CT imaging of the brain was performed following IV bolus contrast injection. Subsequent parametric perfusion maps were calculated using RAPID software. CONTRAST:  129mL OMNIPAQUE IOHEXOL 350 MG/ML SOLN COMPARISON:  CT head 06/28/2018 FINDINGS: Review of the MIP images confirms the above findings CTA NECK FINDINGS Aortic arch: Mild atherosclerotic disease aortic arch  without aneurysm or dissection. Atherosclerotic disease proximal left subclavian artery. Proximal great vessels widely patent. Right carotid system: Atherosclerotic calcification proximal right carotid bifurcation and internal carotid artery with approximately 25% diameter stenosis right internal carotid artery. Left carotid system: Atherosclerotic calcification left carotid bifurcation without significant stenosis Vertebral arteries: Right vertebral artery dominant. Mild to moderate calcific stenosis proximal right vertebral artery. Mild stenosis distal right vertebral artery due to calcific stenosis. Non dominant left vertebral artery occludes V4 segment. Skeleton: Negative Other neck: No acute abnormality Upper chest: Negative Review of the MIP images confirms the above findings CTA HEAD FINDINGS Anterior circulation: Atherosclerotic calcification cavernous carotid bilaterally with mild stenosis bilaterally. Anterior and middle cerebral arteries patent bilaterally without significant stenosis. Posterior circulation: Mild stenosis right V3 segment due to calcific disease. Right PICA patent. Small left vertebral artery supplies PICA. Occlusion of the left V4 segment. Occlusion proximal right P2  segment. Right superior cerebellar artery widely patent. Basilar widely patent. Left superior cerebellar and left posterior cerebral artery widely patent. Venous sinuses: Patent Anatomic variants: None Delayed phase: Not performed Review of the MIP images confirms the above findings CT Brain Perfusion Findings: ASPECTS: 10 CBF (<30%) Volume: 27mL Perfusion (Tmax>6.0s) volume: 97mL Mismatch Volume: 41mL Infarction Location:Small areas of delayed perfusion in the right hippocampus, right occipital lobe, right thalamus corresponding to the right P2 occlusion. IMPRESSION: 1. Occlusion proximal right P2 segment. Corresponding areas of delayed perfusion in the right posterior cerebral artery involving the right hippocampus, thalamus, and right occipital lobe. MRI recommended to evaluate for acute infarct in these areas. 2. Mild atherosclerotic disease in the carotid artery bilaterally 3. Dominant right vertebral artery with mild-to-moderate stenosis proximally and mild stenosis distally. Non dominant left vertebral artery with occlusion left V4 segment. 4. These results were called by telephone at the time of interpretation on 06/28/2018 at 5:38 pm to Dr. Roland Rack , who verbally acknowledged these results. Electronically Signed   By: Franchot Gallo M.D.   On: 06/28/2018 17:40   Ct Head Code Stroke Wo Contrast  Result Date: 06/28/2018 CLINICAL DATA:  Code stroke.  Left leg weakness EXAM: CT HEAD WITHOUT CONTRAST TECHNIQUE: Contiguous axial images were obtained from the base of the skull through the vertex without intravenous contrast. COMPARISON:  None. FINDINGS: Brain: No evidence of acute infarction, hemorrhage, hydrocephalus, extra-axial collection or mass lesion/mass effect. Vascular: Negative for hyperdense vessel Skull: Negative Sinuses/Orbits: Paranasal sinuses clear.  Bilateral cataract surgery Other: None ASPECTS ([place] Stroke Program Early CT Score) - Ganglionic level infarction (caudate, lentiform  nuclei, internal capsule, insula, M1-M3 cortex): 7 - Supraganglionic infarction (M4-M6 cortex): 3 Total score (0-10 with 10 being normal): 10 IMPRESSION: 1. No acute abnormality 2. ASPECTS is 10 Electronically Signed   By: Franchot Gallo M.D.   On: 06/28/2018 17:05    Procedures Procedures (including critical care time)  Medications Ordered in ED Medications  sodium chloride flush (NS) 0.9 % injection 3 mL (3 mLs Intravenous Not Given 06/28/18 1744)  iohexol (OMNIPAQUE) 350 MG/ML injection 100 mL (100 mLs Intravenous Contrast Given 06/28/18 1707)     Initial Impression / Assessment and Plan / ED Course  I have reviewed the triage vital signs and the nursing notes.  Pertinent labs & imaging results that were available during my care of the patient were reviewed by me and considered in my medical decision making (see chart for details).        68 yo M with a chief complaints of left-sided weakness.  Found  the patient to be Lucianne Lei positive, he is within 12 hours of the onset of his symptoms.  Will activate as a code stroke.  Patient was seen by Dr. Leonel Ramsay, he agreed that the patient was pan positive feels that this is unlikely to be embolic more likely to be atherosclerotic.  He feels that he is not a candidate for interventional radiology.  Recommended normal stroke work-up hospital admission.  The patients results and plan were reviewed and discussed.   Any x-rays performed were independently reviewed by myself.   Differential diagnosis were considered with the presenting HPI.  Medications  sodium chloride flush (NS) 0.9 % injection 3 mL (3 mLs Intravenous Not Given 06/28/18 1744)  iohexol (OMNIPAQUE) 350 MG/ML injection 100 mL (100 mLs Intravenous Contrast Given 06/28/18 1707)    Vitals:   06/28/18 1745 06/28/18 1800 06/28/18 1815 06/28/18 1845  BP: 120/66 113/71 112/69 (!) 159/71  Pulse: 62 62 61 (!) 59  Resp: 13 16 12 12   Temp:      TempSrc:      SpO2: 98% 98% 99% 97%    Weight:      Height:        Final diagnoses:  Thalamic stroke (HCC)    Admission/ observation were discussed with the admitting physician, patient and/or family and they are comfortable with the plan.    Final Clinical Impressions(s) / ED Diagnoses   Final diagnoses:  Thalamic stroke [facility])    ED Discharge Orders    None       Deno Etienne, DO 06/28/18 1852

## 2018-06-29 ENCOUNTER — Inpatient Hospital Stay (HOSPITAL_COMMUNITY): Payer: Medicare Other

## 2018-06-29 ENCOUNTER — Encounter (HOSPITAL_COMMUNITY): Payer: Self-pay | Admitting: *Deleted

## 2018-06-29 DIAGNOSIS — I110 Hypertensive heart disease with heart failure: Secondary | ICD-10-CM

## 2018-06-29 DIAGNOSIS — K7460 Unspecified cirrhosis of liver: Secondary | ICD-10-CM

## 2018-06-29 DIAGNOSIS — Z7982 Long term (current) use of aspirin: Secondary | ICD-10-CM

## 2018-06-29 DIAGNOSIS — R011 Cardiac murmur, unspecified: Secondary | ICD-10-CM

## 2018-06-29 DIAGNOSIS — Z794 Long term (current) use of insulin: Secondary | ICD-10-CM

## 2018-06-29 DIAGNOSIS — E1165 Type 2 diabetes mellitus with hyperglycemia: Secondary | ICD-10-CM

## 2018-06-29 DIAGNOSIS — I361 Nonrheumatic tricuspid (valve) insufficiency: Secondary | ICD-10-CM

## 2018-06-29 DIAGNOSIS — G4733 Obstructive sleep apnea (adult) (pediatric): Secondary | ICD-10-CM

## 2018-06-29 DIAGNOSIS — I6320 Cerebral infarction due to unspecified occlusion or stenosis of unspecified precerebral arteries: Secondary | ICD-10-CM

## 2018-06-29 DIAGNOSIS — Z885 Allergy status to narcotic agent status: Secondary | ICD-10-CM

## 2018-06-29 DIAGNOSIS — E785 Hyperlipidemia, unspecified: Secondary | ICD-10-CM

## 2018-06-29 DIAGNOSIS — Z888 Allergy status to other drugs, medicaments and biological substances status: Secondary | ICD-10-CM

## 2018-06-29 DIAGNOSIS — I2510 Atherosclerotic heart disease of native coronary artery without angina pectoris: Secondary | ICD-10-CM

## 2018-06-29 DIAGNOSIS — I69351 Hemiplegia and hemiparesis following cerebral infarction affecting right dominant side: Secondary | ICD-10-CM

## 2018-06-29 DIAGNOSIS — I5030 Unspecified diastolic (congestive) heart failure: Secondary | ICD-10-CM

## 2018-06-29 DIAGNOSIS — I6300 Cerebral infarction due to thrombosis of unspecified precerebral artery: Secondary | ICD-10-CM

## 2018-06-29 DIAGNOSIS — Z951 Presence of aortocoronary bypass graft: Secondary | ICD-10-CM

## 2018-06-29 DIAGNOSIS — Z79899 Other long term (current) drug therapy: Secondary | ICD-10-CM

## 2018-06-29 DIAGNOSIS — I340 Nonrheumatic mitral (valve) insufficiency: Secondary | ICD-10-CM

## 2018-06-29 DIAGNOSIS — Z952 Presence of prosthetic heart valve: Secondary | ICD-10-CM

## 2018-06-29 DIAGNOSIS — Z880 Allergy status to penicillin: Secondary | ICD-10-CM

## 2018-06-29 DIAGNOSIS — G8194 Hemiplegia, unspecified affecting left nondominant side: Secondary | ICD-10-CM

## 2018-06-29 LAB — HIV ANTIBODY (ROUTINE TESTING W REFLEX): HIV Screen 4th Generation wRfx: NONREACTIVE

## 2018-06-29 LAB — GLUCOSE, CAPILLARY
Glucose-Capillary: 179 mg/dL — ABNORMAL HIGH (ref 70–99)
Glucose-Capillary: 198 mg/dL — ABNORMAL HIGH (ref 70–99)
Glucose-Capillary: 182 mg/dL — ABNORMAL HIGH (ref 70–99)
Glucose-Capillary: 119 mg/dL — ABNORMAL HIGH (ref 70–99)

## 2018-06-29 LAB — LIPID PANEL
Cholesterol: 274 mg/dL — ABNORMAL HIGH (ref 0–200)
HDL: 45 mg/dL (ref >40)
LDL Cholesterol: 181 mg/dL — ABNORMAL HIGH (ref 0–99)
Total CHOL/HDL Ratio: 6.1 RATIO
Triglycerides: 242 mg/dL — ABNORMAL HIGH (ref <150)
VLDL: 48 mg/dL — ABNORMAL HIGH (ref 0–40)

## 2018-06-29 LAB — HEMOGLOBIN A1C
Hgb A1c MFr Bld: 12 % — ABNORMAL HIGH (ref 4.8–5.6)
Mean Plasma Glucose: 297.7 mg/dL

## 2018-06-29 MED ORDER — SODIUM CHLORIDE 0.9 % IV BOLUS
1000.0000 mL | Freq: Once | INTRAVENOUS | Status: AC
  Administered 2018-06-29: 1000 mL via INTRAVENOUS

## 2018-06-29 MED ORDER — ATORVASTATIN CALCIUM 80 MG PO TABS: 80.0000 mg | ORAL_TABLET | Freq: Every day | ORAL | Status: DC

## 2018-06-29 MED ORDER — ONDANSETRON HCL 4 MG PO TABS: 4.0000 mg | ORAL_TABLET | Freq: Three times a day (TID) | ORAL | Status: DC | PRN

## 2018-06-29 NOTE — Progress Notes (Signed)
  Echocardiogram 2D Echocardiogram has been performed.  Mark Cabrera 06/29/2018, 11:58 AM

## 2018-06-29 NOTE — Evaluation (Signed)
Occupational Therapy Evaluation Patient Details Name: Mark Cabrera MRN: [id number] DOB: 08-14-1950 Today's Date: 06/29/2018    History of Present Illness Pt is a 68 y/o male with a PMH significant for CVA (residual R side deficits), DM, CAD, a-fib, CHF. He presents with stroke-like symptoms and MRI revealed small acute infarcts in the medial right temporal lobe and right occipital lobe.    Clinical Impression   Pt admitted with above. He demonstrates the below listed deficits and will benefit from continued OT to maximize safety and independence with BADLs.  Pt presents to OT with ataxia, impaired balance, impulsivity, as well as Lt visual field deficit.   He required mod A for LB ADLs and functional transfers, which appears to be a decline in function since working with PT.  VSS, RN notified - unsure if pt is fatigued.   Pt lives alone, but will stay with his sister at discharge.  Depending on progress, may benefit from CIR vs HH.  Will follow.       Follow Up Recommendations  CIR    Equipment Recommendations  3 in 1 bedside commode    Recommendations for Other Services Rehab consult     Precautions / Restrictions Precautions Precautions: Fall Precaution Comments: Prior R side deficits from last stroke Restrictions Weight Bearing Restrictions: No      Mobility Bed Mobility Overal bed mobility: Modified Independent                Transfers Overall transfer level: Needs assistance Equipment used: None Transfers: Sit to/from [employer] Transfers Sit to Stand: Mod assist Stand pivot transfers: Mod assist       General transfer comment: Pt unsteady with ataxia noted     Balance Overall balance assessment: Needs assistance Sitting-balance support: Feet supported;No upper extremity supported Sitting balance-Leahy Scale: Fair     Standing balance support: No upper extremity supported Standing balance-Leahy Scale: Poor Standing balance comment: Pt requires  min - mod A for static standing                            ADL either performed or assessed with clinical judgement   ADL Overall ADL's : Needs assistance/impaired Eating/Feeding: Modified independent   Grooming: Wash/dry hands;Wash/dry face;Oral care;Brushing hair;Sitting;Set up;Supervision/safety   Upper Body Bathing: Set up;Sitting   Lower Body Bathing: Moderate assistance;Sit to/from stand   Upper Body Dressing : Set up;Sitting   Lower Body Dressing: Moderate assistance;Sit to/from stand   Toilet Transfer: Moderate assistance;Stand-pivot;BSC   Toileting- Clothing Manipulation and Hygiene: Moderate assistance;Sit to/from stand       Functional mobility during ADLs: Moderate assistance General ADL Comments: Pt off balance due to ataxia when standing.  He is impulsive with activity      Vision   Vision Assessment?: Yes Eye Alignment: Within Functional Limits Ocular Range of Motion: Within Functional Limits Alignment/Gaze Preference: Within Defined Limits Tracking/Visual Pursuits: Decreased smoothness of horizontal tracking;Decreased smoothness of vertical tracking Visual Fields: Left homonymous hemianopsia     Perception Perception Perception Tested?: Yes   Praxis Praxis Praxis tested?: Within functional limits    Pertinent Vitals/Pain       Hand Dominance Right   Extremity/Trunk Assessment Upper Extremity Assessment Upper Extremity Assessment: LUE deficits/detail LUE Deficits / Details: mild dysmetria  LUE Coordination: decreased gross motor   Lower Extremity Assessment Lower Extremity Assessment: Defer to PT evaluation   Cervical / Trunk Assessment Cervical / Trunk Assessment: Normal  Communication Communication Communication: No difficulties   Cognition Arousal/Alertness: Awake/alert Behavior During Therapy: WFL for tasks assessed/performed;Impulsive Overall Cognitive Status: Impaired/Different from baseline Area of Impairment:  Safety/judgement                         Safety/Judgement: Decreased awareness of safety;Decreased awareness of deficits         General Comments  Pt reports increased numbness Lt UE and LE since sitting in the chair.  Strength remains the same as noted on PT eval.  RN in to assess pt.  RN notified of pt c/o and increased assist to transfer to bed BP 165/85; 02 sat 100%; HR 65     Exercises     Shoulder Instructions      Home Living Family/patient expects to be discharged to:: Private residence Living Arrangements: Alone Available Help at Discharge: Family;Available 24 hours/day Type of Home: House Home Access: Stairs to enter [employer] of Steps: 1   Home Layout: One level(sister's house and his house)     Bathroom Shower/Tub: Tub/shower unit(At sisters house)   Biochemist, clinical: Standard(Sister's house)         Additional Comments: Lives off the grid - [place] and has a room off the back of a barn. Heats by [employer]. Has to walk ~200 yard to get to a shower the friend has built for the pt. Open to air.   Lives With: Alone    Prior Functioning/Environment Level of Independence: Independent        Comments: adapted to R side deficits from first stroke        OT Problem List: Decreased activity tolerance;Impaired balance (sitting and/or standing);Impaired vision/perception;Decreased cognition;Decreased safety awareness;Decreased knowledge of use of DME or AE;Impaired sensation;Impaired UE functional use      OT Treatment/Interventions: Self-care/ADL training;DME and/or AE instruction;Therapeutic activities;Visual/perceptual remediation/compensation;Patient/family education;Balance training;Neuromuscular education    OT Goals(Current goals can be found in the care plan section) Acute Rehab OT Goals Patient Stated Goal: to get back to normal  OT Goal Formulation: With patient Time For Goal Achievement: 07/13/18 Potential to  Achieve Goals: Good ADL Goals Pt Will Perform Grooming: with min guard assist;standing Pt Will Perform Lower Body Bathing: with min assist;sit to/from stand Pt Will Perform Lower Body Dressing: with min assist;sit to/from stand Pt Will Transfer to Toilet: with min assist;ambulating;regular height toilet;bedside commode;grab bars Pt Will Perform Toileting - Clothing Manipulation and hygiene: with min assist;sit to/from stand  OT Frequency: Min 2X/week   Barriers to D/C: Decreased caregiver support          Co-evaluation              AM-PAC OT "6 Clicks" Daily Activity     Outcome Measure Help from another person eating meals?: None Help from another person taking care of personal grooming?: A Little Help from another person toileting, which includes using toliet, bedpan, or urinal?: A Lot Help from another person bathing (including washing, rinsing, drying)?: A Lot Help from another person to put on and taking off regular upper body clothing?: A Little Help from another person to put on and taking off regular lower body clothing?: A Lot 6 Click Score: 16   End of Session Equipment Utilized During Treatment: Gait belt Nurse Communication: Mobility status  Activity Tolerance: Patient tolerated treatment well Patient left: in bed;with call bell/phone within reach;with bed alarm set  OT Visit Diagnosis: Ataxia, unspecified (R27.0)  Time: 8828-0034 OT Time Calculation (min): 34 min Charges:  OT General Charges $OT Visit: 1 Visit OT Evaluation $OT Eval Moderate Complexity: 1 Mod OT Treatments $Self Care/Home Management : 8-22 mins  Lucille Passy, OTR/L Acute Rehabilitation Services Pager [phone] Office [phone]   Lucille Passy M 06/29/2018, 5:35 PM

## 2018-06-29 NOTE — Progress Notes (Signed)
Whiles sitting up in chair; pt report feeling increased numbness and tingling to LUE; pt was able to lift and +4 strength with moderate grip; smooth finger to nose when checked; pt c/o nausea and wanted something for it. MD paged and notified of pt's symptoms and new order received for nausea. Will continue to closely monitor pt. Delia Heady RN

## 2018-06-29 NOTE — Progress Notes (Signed)
Paged for patient having left sided arm numbness temporarily while working with PT that resolved, then recurred thirty minutes later. She performed a neurological exam which was consistent with left sided ataxia and abnormal finger to nose test.  Went to patient's room where he stated he was having numbness in left arm from shoulder to fingers and his arm felt like a "lead weight." He also stated his left leg felt slightly weaker. He endorses dizziness with sitting up which was not present earlier. On PE he had abnormal left finger to nose test, and grip strength was decreased compared to earlier. Abnormal shin test on left as well. LE strength was symmetric bilaterally and reflexes were 3+/4.  He has a history of afib after CABG and is not on anticoagulation. Reviewed tele which showed multiple PVCs but no atrial fibrillation.   - discussed with neurology  - stat CT head to assess for hemorrhagic conversion  - if CT head negative will bolus as he has been normotensive intermittently since admission.

## 2018-06-29 NOTE — Progress Notes (Signed)
Pt called RN to room to and c/o increased numbness and tingling to LUE after it improved from laying down earlier. Ataxia noted to LUE; MD paged and notified; MD in at bedside. Will continue to closely monitor. Delia Heady RN

## 2018-06-29 NOTE — Progress Notes (Signed)
Occupational Therapy Progress note  Pt seen for second session.  Pt reports feeling better after resting for a bit.  He was able to perform LB ADLs and functional transfers with min A - ataxia noted.  Coordination of Lt UE better than it was during eval.  Depending on progress, may benefit from CIR.    06/29/18 1800  OT Visit Information  Last OT Received On 06/29/18  Assistance Needed +1  History of Present Illness Pt is a 68 y/o male with a PMH significant for CVA (residual R side deficits), DM, CAD, a-fib, CHF. He presents with stroke-like symptoms and MRI revealed small acute infarcts in the medial right temporal lobe and right occipital lobe.   Precautions  Precautions Fall  Precaution Comments Prior R side deficits from last stroke  Cognition  Arousal/Alertness Awake/alert  Behavior During Therapy WFL for tasks assessed/performed;Impulsive  Overall Cognitive Status Impaired/Different from baseline  Area of Impairment Safety/judgement  Safety/Judgement Decreased awareness of safety;Decreased awareness of deficits  Upper Extremity Assessment  Upper Extremity Assessment LUE deficits/detail  LUE Deficits / Details mild dysmetria.  Pt with improved coordination as compared to previous session - he was better able to don/doff socks without as many errors   LUE Coordination decreased gross motor  Lower Extremity Assessment  Lower Extremity Assessment Defer to PT evaluation  ADL  Overall ADL's  Needs assistance/impaired  Eating/Feeding Modified independent  Lower Body Dressing Sit to/from stand;Minimal assistance  Lower Body Dressing Details (indicate cue type and reason) assist for balance   Toilet Transfer BSC;Minimal assistance;Ambulation  Toilet Transfer Details (indicate cue type and reason) able ambulate to BR with min/HHA.  ataxia noted   Toileting- Water quality scientist and Hygiene Sit to/from stand;Minimal assistance  Functional mobility during ADLs Minimal assistance   General ADL Comments Pt reports he feels much better after resting for a bit,   Bed Mobility  Overal bed mobility Modified Independent  Balance  Overall balance assessment Needs assistance  Sitting-balance support Feet supported;No upper extremity supported  Sitting balance-Leahy Scale Fair  Standing balance support No upper extremity supported  Standing balance-Leahy Scale Poor  Standing balance comment Pt requires min - mod A for static standing   Restrictions  Weight Bearing Restrictions No  Vision- Assessment  Vision Assessment? Yes  Eye Alignment WFL  Ocular Range of [facility]  Alignment/Gaze Preference WDL  Tracking/Visual Pursuits Decreased smoothness of horizontal tracking;Decreased smoothness of vertical tracking  Visual Fields Left homonymous hemianopsia  Transfers  Overall transfer level Needs assistance  Equipment used None  Transfers Sit to/from [employer] Transfers  Sit to [employer] assist  Stand pivot transfers Mod assist;Min assist  General transfer comment requires min A for balance   OT - End of Session  Equipment Utilized During Treatment Gait belt  Activity Tolerance Patient tolerated treatment well  Patient left in bed;with call bell/phone within reach;with bed alarm set  Nurse Communication Mobility status  OT Assessment/Plan  OT Visit Diagnosis Ataxia, unspecified (R27.0)  OT Frequency (ACUTE ONLY) Min 2X/week  Recommendations for Other Services Rehab consult  Follow Up Recommendations CIR  OT Equipment 3 in 1 bedside commode  AM-PAC OT "6 Clicks" Daily Activity Outcome Measure (Version 2)  Help from another person eating meals? 4  Help from another person taking care of personal grooming? 3  Help from another person toileting, which includes using toliet, bedpan, or urinal? 3  Help from another person bathing (including washing, rinsing, drying)? 2  Help from another person  to put on and taking off regular upper body clothing? 3  Help from  another person to put on and taking off regular lower body clothing? 3  6 Click Score 18  OT Goal Progression  Progress towards OT goals Progressing toward goals  Acute Rehab OT Goals  Patient Stated Goal to get back to normal   OT Goal Formulation With patient  Time For Goal Achievement 07/13/18  Potential to Achieve Goals Good  OT Time Calculation  OT Start Time (ACUTE ONLY) 1738  OT Stop Time (ACUTE ONLY) 1807  OT Time Calculation (min) 29 min  OT General Charges  $OT Visit 1 Visit  OT Treatments  $Self Care/Home Management  23-37 mins  Lucille Passy, OTR/L Acute Rehabilitation Services Pager [phone] Office [phone]

## 2018-06-29 NOTE — Progress Notes (Signed)
Subjective: Mark Cabrera feels his weakness has somewhat improved but not back to normal. Had a mild headache last night which resolved. He is unsure how his legs are doing because he hasn't been out of bed yet. He does endorse some continued numbness in his mouth but believes this to be related to his previous stroke.  He is currently taking 30 units BID Lantus and Humalog 15 units TID before mealtime. He states he has been consistent with this regimen the past month. He has seen CBGs in 400-500s for about a week, but improved to 300s more recently.  He recounts the symptoms that prompted him to go to the ED last night were numbness around his mouth and LUE weakness.  States statins give him "head sores and back aches." He is amenable to trying a statin again. Explained that good diabetes and cholesterol control are the best ways to prevent another stroke in the future.   Objective:  Vital signs in last 24 hours: Vitals:   06/29/18 0000 06/29/18 0200 06/29/18 0400 06/29/18 0736  BP: 137/86 (!) 152/84 139/76 128/77  Pulse: 62 (!) 58 60 (!) 58  Resp: 16 16 16 16   Temp: 98.3 F (36.8 C) 98 F (36.7 C) 98.2 F (36.8 C) 97.7 F (36.5 C)  TempSrc: Oral Oral Oral Oral  SpO2: 96% 97% 98% 99%  Weight:      Height:       General: awake, alert, lying in bed in NAD Cardio: RRR; *murmur  Resp: normal work of breathing; lungs CTAB Neuro: A&O x3; Cranial nerves intact. Strength is 5/5 in bilateral upper and lower extremities. Sensation intact apart from altered sensation in right upper and lower extremities that is chronic from previous strokes. Cerebellar testing normal.   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   Right temporal/occipital CVA MRI showed right temporal and occipital infarct on the right with CTA showing occlusion of proximal right P2 segment. Symptoms appear to have resolved currently, and he is only exhibiting his residual deficits from his 2007 stroke.  He has a history of allergy to multiple different statins with muscle aches and pustular rash. He has elevated cholesterol and LDL and high risk for recurrence of stroke and needs cholesterol lowering medications.   - neurology consulted, appreciate recommendations - consult to care management for insurance approval of Repatha  - ECHO pending  - cont. Asa 81 mg qd - PT/OT ordered - cont. Permissive HTN  HTN Diastolic HF CAD s/p CABG and bovine AVR 2/2 aortic steniss  Home medications include coreg 3.125 mg BID and lasix 40 mg qam and 80 mg qpm.  - cont. 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 He states he has been taking his lantus 30U bid and novolog 15U tid before meals.   - cont. lantus 15U bid - cont. novolog 8U tid with meals  - SSI  - cont. Gabapentin 100 mg TID  VTE: lovenox IVF: none Diet: heart healthy/carb modified Code: partial - would like intubation but does not want chest compressions or shock  Dispo: Anticipated discharge in approximately today or tomorrow.   Seawell, Jaimie A, DO 06/29/2018, 9:24  AM Pager: [phone]

## 2018-06-29 NOTE — Consult Note (Signed)
Neurology Consultation Reason for Consult: Left-sided weakness Referring Physician: Tyrone Nine, D  CC: Left-sided weakness  History is obtained from: Patient  HPI: Mark Cabrera is a 68 y.o. male with a history of postop atrial fibrillation not on anticoagulation, vertebral artery dissection who presents with left-sided weakness and visual change that was present on awakening this morning.  His PCP was checking on his blood glucose levels, and incidentally the patient mentioned that he had new left-sided numbness and lower extremity weakness and was having trouble walking.  Dr. Tarri Abernethy appropriately had the patient called 911.   LKW: 10 PM 4/16 tpa given?: no, out of window Thrombectomy candidate?  No, distal occlusion, mild symptoms    ROS: A 14 point ROS was performed and is negative except as noted in the HPI.   Past Medical History:  Diagnosis Date  . Acute diastolic congestive heart failure ([place])   . Aortic valve stenosis s/p AVR 2018  . Atrial fibrillation ([place]) - post-op CABG    04/2016 CHA2DS2VAS score = 5  . Atypical nevi   . Coronary artery disease s/p 2 vessel CABG   . Depression    "years ago"  . Diabetes mellitus   . Dyspnea    in the past   . GERD (gastroesophageal reflux disease)   . Heart murmur   . Hepatic cirrhosis ([place]) 06/29/2017  . History of kidney stones   . Hyperlipidemia    hx of transaminitis secondary to statin and he has decided not to use statins secondary to potential side effects.  . Hypertension   . Nephrolithiasis   . Osteoarthritis, knee   . Sleep apnea     Central apnea. Not using cpap  . Stroke [facility]) 2007  . Transaminitis     Statin-induced  . 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 MRI of the brain 2007 showed acute left lateral medullary infarct in the distribution of left posterior inferior cerebral artery , narrowing of the left vertebral with severe diminution of flow  or acute occlusion. 2-D echo was normal no embolic source found.     Family History  Problem Relation Age of Onset  . Hypertension Mother   . Diabetes Mother   . Alcohol abuse Father      Social History:  reports that he has never smoked. He has never used smokeless tobacco. He reports current alcohol use. He reports that he does not use drugs.   Exam: Current vital signs: BP 139/76 (BP Location: Right Arm)   Pulse 60   Temp 98.2 F (36.8 C) (Oral)   Resp 16   Ht 5\' 8"  (1.727 m)   Wt 93.2 kg   SpO2 98%   BMI 31.24 kg/m  Vital signs in last 24 hours: Temp:  [97.7 F (36.5 C)-98.4 F (36.9 C)] 98.2 F (36.8 C) (04/18 0400) Pulse Rate:  [58-67] 60 (04/18 0400) Resp:  [11-18] 16 (04/18 0400) BP: (112-173)/(66-95) 139/76 (04/18 0400) SpO2:  [96 %-100 %] 98 % (04/18 0400) Weight:  [93.2 kg-94.6 kg] 93.2 kg (04/17 2035)   Physical Exam  Constitutional: Appears well-developed and well-nourished.  Psych: Affect appropriate to situation Eyes: No scleral injection HENT: No OP obstrucion Head: Normocephalic.  Cardiovascular: Normal rate and regular rhythm.  Respiratory: Effort normal, non-labored breathing GI: Soft.  No distension. There is no tenderness.  Skin: WDI  Neuro: Mental Status: Patient is awake, alert, oriented to person, place, month, year, and  situation. Patient is able to give a clear and coherent history. No signs of aphasia or neglect Cranial Nerves: II: Left upper field cut. Pupils are equal, round, and reactive to light.   III,IV, VI: EOMI without ptosis or diploplia.  V: Facial sensation is symmetric to temperature VII: Facial movement is symmetric.  VIII: hearing is intact to voice X: Uvula elevates symmetrically XI: Shoulder shrug is symmetric. XII: tongue is midline without atrophy or fasciculations.  Motor: Tone is normal. Bulk is normal. 5/5 strength was present on the right, on the left he has 4+/5 strength in the left arm 4/5 strength in  the left leg Sensory: Sensation is diminished in the left arm and leg Cerebellar: Finger-nose-finger are consistent with weakness on the left, intact on the right   I have reviewed labs in epic and the results pertinent to this consultation are: CMP-unremarkable  I have reviewed the images obtained: CT A/P-small area of hypoperfusion in the right occipital region, P2 occlusion on the right  Impression: 68 year old male with right P2 occlusion.  This does not explain his left-sided weakness and I suspect that he previously had a P1 occlusion with involvement of the thalamic perforators which then subsequently moved distally.  This distal clot does explain his left upper field cut, however I would be very hesitant to consider thrombectomy and someone with such a distal clot for a quadrantanopia as I do not think it would help his weakness.   I suspect this is due more to intrinsic posterior circulation disease rather than cardioembolism, however difficult to absolutely exclude.  Recommendations: - HgbA1c, fasting lipid panel - MRI of the brain without contrast - Frequent neuro checks - Echocardiogram - Prophylactic therapy-Antiplatelet med: Aspirin - dose 325mg  PO or 300mg  PR - Risk factor modification - Telemetry monitoring - PT consult, OT consult, Speech consult - Stroke team to follow    Roland Rack, MD [facility] [phone]  If 7pm- 7am, please page neurology on call as listed in [place].

## 2018-06-29 NOTE — Evaluation (Signed)
Physical Therapy Evaluation Patient Details Name: Mark Cabrera MRN: [id number] DOB: 18-Nov-1950 Today's Date: 06/29/2018   History of Present Illness  Pt is a 68 y/o male with a PMH significant for CVA (residual R side deficits), DM, CAD, a-fib, CHF. He presents with stroke-like symptoms and MRI revealed small acute infarcts in the medial right temporal lobe and right occipital lobe.   Clinical Impression  Pt admitted with above diagnosis. Pt currently with functional limitations due to the deficits listed below (see PT Problem List). At the time of PT eval pt was able to perform transfers with mod I to supervision for safety, and ambulation with occasional min assist for unsteadiness and LOB. Pt reports he can stay with his sister for several weeks if needbe at d/c, where he will have 24 hour assistance and the availability for HHPT services to follow-up with him. Acutely, pt will benefit from skilled PT to increase their independence and safety with mobility to allow discharge to the venue listed below.     Follow Up Recommendations Home health PT;Supervision for mobility/OOB    Equipment Recommendations  Cane(Depending on progress with PT - refused RW)    Recommendations for Other Services       Precautions / Restrictions Precautions Precautions: Fall Precaution Comments: Prior R side deficits from last stroke Restrictions Weight Bearing Restrictions: No      Mobility  Bed Mobility Overal bed mobility: Modified Independent             General bed mobility comments: HOB elevated but no rails required.   Transfers Overall transfer level: Needs assistance Equipment used: None Transfers: Sit to/from Stand Sit to Stand: Supervision         General transfer comment: Supervision provided for safety. During session, pt stood for the first time "since tuesday" per pt report. He appeared unsteady and reports short bout of dizziness but was able to recover without assistance and  reports dizziness improved prior to initiating gait training.   Ambulation/Gait Ambulation/Gait assistance: Min assist Gait Distance (Feet): 400 Feet Assistive device: None Gait Pattern/deviations: Step-through pattern;Decreased stride length;Drifts right/left;Steppage;Antalgic Gait velocity: Decreased Gait velocity interpretation: <1.8 ft/sec, indicate of risk for recurrent falls General Gait Details: Pt with R lateral lean and drifting during ambulation. Noted steppage gait and difficulty controlling contact with the ground on L foot at times.  Stairs            Wheelchair Mobility    Modified Rankin (Stroke Patients Only) Modified Rankin (Stroke Patients Only) Pre-Morbid Rankin Score: Moderate disability Modified Rankin: Moderately severe disability     Balance Overall balance assessment: Needs assistance Sitting-balance support: Feet supported;No upper extremity supported Sitting balance-Leahy Scale: Fair     Standing balance support: No upper extremity supported Standing balance-Leahy Scale: Poor Standing balance comment: Several LOB                             Pertinent Vitals/Pain Pain Assessment: No/denies pain    Home Living Family/patient expects to be discharged to:: Private residence Living Arrangements: Alone Available Help at Discharge: Family;Available 24 hours/day Type of Home: House Home Access: Stairs to enter   [employer] of Steps: 1 Home Layout: One level(sister's house and his house)   Additional Comments: Lives off the grid - [place] and has a room off the back of a barn. Heats by [employer]. Has to walk ~200 yard to get to  a shower the friend has built for the pt. Open to air.     Prior Function Level of Independence: Independent         Comments: adapted to R side deficits from first stroke     Hand Dominance   Dominant Hand: Right    Extremity/Trunk Assessment   Upper Extremity  Assessment Upper Extremity Assessment: Defer to OT evaluation    Lower Extremity Assessment Lower Extremity Assessment: RLE deficits/detail;LLE deficits/detail RLE Deficits / Details: Bilaterally, MMT revealed 4+/5 strength in quads, hamstrings, hip flexors and ankle DF. Decreased sensation to light touch bilaterally but worse on the R.     Cervical / Trunk Assessment Cervical / Trunk Assessment: Normal  Communication   Communication: No difficulties  Cognition Arousal/Alertness: Awake/alert Behavior During Therapy: WFL for tasks assessed/performed Overall Cognitive Status: Within Functional Limits for tasks assessed                                        General Comments      Exercises     Assessment/Plan    PT Assessment Patient needs continued PT services  PT Problem List Decreased strength;Decreased activity tolerance;Decreased balance;Decreased mobility;Decreased knowledge of use of DME;Decreased safety awareness;Decreased knowledge of precautions       PT Treatment Interventions DME instruction;Gait training;Stair training;Functional mobility training;Therapeutic activities;Therapeutic exercise;Neuromuscular re-education;Patient/family education    PT Goals (Current goals can be found in the Care Plan section)  Acute Rehab PT Goals Patient Stated Goal: Back to living at his own place PT Goal Formulation: With patient Time For Goal Achievement: 07/06/18 Potential to Achieve Goals: Good    Frequency Min 4X/week   Barriers to discharge        Co-evaluation               AM-PAC PT "6 Clicks" Mobility  Outcome Measure Help needed turning from your back to your side while in a flat bed without using bedrails?: None Help needed moving from lying on your back to sitting on the side of a flat bed without using bedrails?: None Help needed moving to and from a bed to a chair (including a wheelchair)?: None Help needed standing up from a chair  using your arms (e.g., wheelchair or bedside chair)?: None Help needed to walk in hospital room?: A Little Help needed climbing 3-5 steps with a railing? : A Little 6 Click Score: 22    End of Session Equipment Utilized During Treatment: Gait belt Activity Tolerance: Patient tolerated treatment well Patient left: in chair;with call bell/phone within reach Nurse Communication: Mobility status PT Visit Diagnosis: Unsteadiness on feet (R26.81);Other symptoms and signs involving the nervous system (R29.898)    Time: 1350-1420 PT Time Calculation (min) (ACUTE ONLY): 30 min   Charges:   PT Evaluation $PT Eval Moderate Complexity: 1 Mod PT Treatments $Gait Training: 8-22 mins        Rolinda Roan, PT, DPT Acute Rehabilitation Services Pager: [phone] Office: [phone]   Thelma Comp 06/29/2018, 3:09 PM

## 2018-06-29 NOTE — Progress Notes (Signed)
STROKE TEAM PROGRESS NOTE   HISTORY OF PRESENT ILLNESS (per record) Mark Cabrera is a 68 y.o. male with a history of postop atrial fibrillation not on anticoagulation, and vertebral artery dissection who presents with left-sided weakness and visual change that was present on awakening this morning.  His PCP was checking on his blood glucose levels, and incidentally the patient mentioned that he had new left-sided numbness and lower extremity weakness and was having trouble walking.  Dr. Tarri Abernethy appropriately had the patient call 911.   LKW: 10 PM 4/16 tpa given?: no, out of window Thrombectomy candidate?  No, distal occlusion, mild symptoms   SUBJECTIVE (INTERVAL HISTORY) I have reviewed patient's history of presenting illness in detail.  He presented with left-sided numbness and weakness which appears to be improving.  He has remote history of transient postop atrial fibrillation following cardiac surgery.  He has remote history of left medullary infarct in 2007 from which she has improved but does have residual balance and gait difficulties.  OBJECTIVE Vitals:   06/29/18 0000 06/29/18 0200 06/29/18 0400 06/29/18 0736  BP: 137/86 (!) 152/84 139/76 128/77  Pulse: 62 (!) 58 60 (!) 58  Resp: 16 16 16 16   Temp: 98.3 F (36.8 C) 98 F (36.7 C) 98.2 F (36.8 C) 97.7 F (36.5 C)  TempSrc: Oral Oral Oral Oral  SpO2: 96% 97% 98% 99%  Weight:      Height:        CBC:  Recent Labs  Lab 06/28/18 1658  WBC 4.8  NEUTROABS 2.9  HGB 13.7  HCT 41.7  MCV 87.6  PLT 875    Basic Metabolic Panel:  Recent Labs  Lab 06/28/18 1658  NA 138  K 3.8  CL 102  CO2 25  GLUCOSE 196*  BUN 11  CREATININE 0.90  CALCIUM 9.5    Lipid Panel:     Component Value Date/Time   CHOL 274 (H) 06/29/2018 0420   CHOL 323 (H) 01/18/2018 0900   TRIG 242 (H) 06/29/2018 0420   HDL 45 06/29/2018 0420   HDL 52 01/18/2018 0900   CHOLHDL 6.1 06/29/2018 0420   VLDL 48 (H) 06/29/2018 0420   LDLCALC 181  (H) 06/29/2018 0420   LDLCALC 223 (H) 01/18/2018 0900   HgbA1c:  Lab Results  Component Value Date   HGBA1C 12.0 (H) 06/29/2018   Urine Drug Screen: No results found for: LABOPIA, COCAINSCRNUR, LABBENZ, AMPHETMU, THCU, LABBARB  Alcohol Level No results found for: ETH  IMAGING  Ct Angio Head W Or Wo Contrast Ct Angio Neck W Or Wo Contrast Ct Cerebral Perfusion W Contrast 06/28/2018 IMPRESSION:  1. Occlusion proximal right P2 segment. Corresponding areas of delayed perfusion in the right posterior cerebral artery involving the right hippocampus, thalamus, and right occipital lobe. MRI recommended to evaluate for acute infarct in these areas.  2. Mild atherosclerotic disease in the carotid artery bilaterally  3. Dominant right vertebral artery with mild-to-moderate stenosis proximally and mild stenosis distally. Non dominant left vertebral artery with occlusion left V4 segment.    Mr Brain Wo Contrast 06/28/2018 IMPRESSION:  1. Small acute infarcts in the mesial right temporal lobe and right occipital lobe.  2. Chronic left basal ganglia lacunar infarct, new from 2014.     Ct Head Code Stroke Wo Contrast 06/28/2018 IMPRESSION:  1. No acute abnormality  2. ASPECTS is 10    Transthoracic Echocardiogram  00/00/2020 Pending    EKG - SR rate 60 BPM. (See cardiology reading for  complete details)    PHYSICAL EXAM Blood pressure 128/77, pulse (!) 58, temperature 97.7 F (36.5 C), temperature source Oral, resp. rate 16, height 5\' 8"  (1.727 m), weight 93.2 kg, SpO2 99 %.   . Afebrile. Head is nontraumatic. Neck is supple without bruit.    Cardiac exam no murmur or gallop. Lungs are clear to auscultation. Distal pulses are well felt.  Neurological Exam :  Awake alert oriented x3 with normal speech and language function.  No dysarthria or aphasia.  Extraocular movements are full range without nystagmus.  Dense left homonymous hemianopsia.  Pupils equal reactive.  Fundi not  visualized.  Face is symmetric without weakness.  Tongue midline.  Motor system exam reveals symmetric upper and lower extremity strength without drift but mild weakness of left grip and intrinsic hand muscles.  Orbits right over left upper extremity.  Mild left hip flexor weakness.  Diminished left body touch pinprick sensation.  Coordination slow but accurate.  Gait not tested.    ASSESSMENT/PLAN [name] is a 68 y.o. male with history of postop atrial fibrillation not on anticoagulation, and vertebral artery dissection presenting with left-sided weakness and visual change that was present on awakening this morning.Marland Kitchen He did not receive IV t-PA due to late presentation.  Stroke: Small acute infarcts in the mesial right temporal lobe and right occipital lobe - embolic -given prior history of atrial fibrillation  Resultant left homonymous hemianopsia and mild diminished sensation  CT head - No acute abnormality   MRI head - Small acute infarcts in the mesial right temporal lobe and right occipital lobe.   MRA head - not performed  CTA H&N - Occlusion proximal right P2 segment. Dominant right vertebral artery with mild-to-moderate stenosis.   Carotid Doppler - CTA neck performed - carotid dopplers not indicated.  2D Echo - pending  LDL - 181  HgbA1c - 12  UDS - not performed  VTE prophylaxis - Lovenox  Diet - Heart healthy / carb modified with thin liquids.  aspirin 81 mg daily prior to admission, now on aspirin 81 mg daily  Patient counseled to be compliant with his antithrombotic medications  Ongoing aggressive stroke risk factor management  Therapy recommendations:  pending  Disposition:  Pending  Hypertension  Stable . Permissive hypertension (OK if < 220/120) but gradually normalize in 5-7 days . Long-term BP goal normotensive  Hyperlipidemia  Lipid lowering medication PTA: Crestor 10 mg daily  LDL 181, goal < 70  Current lipid lowering medication:  none - consider Lipitor 80 mg daily  Continue statin at discharge  Diabetes  HgbA1c 12, goal < 7.0  Uncontrolled  Other Stroke Risk Factors  Advanced age  ETOH use, advised to drink no more than 1 alcoholic beverage per day.  Obesity, Body mass index is 31.24 kg/m., recommend weight loss, diet and exercise as appropriate   Hx stroke/TIA  Coronary artery disease  Obstructive sleep apnea, not on CPAP at home  Atrial fibrillation - post CABG.    I have personally obtained history,examined this patient, reviewed notes, independently viewed imaging studies, participated in medical decision making and plan of care.ROS completed by me personally and pertinent positives fully documented  I have made any additions or clarifications directly to the above note.  He presented with left-sided weakness and numbness secondary to embolic right PCA infarct.  He has prior history of left PICA infarct in 2007 and given history of transient postop A. fib he likely needs to be on  long-term anticoagulation.  Recommend Eliquis after pharmacy consult.  Check 2D echo results.  Aggressive risk factor modification.  Patient has history of statin intolerance and will benefit with the new PCSK9 inhibitor injections like Repatha or Praluent.  Patient counseled to be compliant with using CPAP at home for his sleep apnea.  Discussed with Dr. Sharon Seller.  Greater than 50% time during this 35-minute visit was spent on counseling and coordination of care about his embolic stroke and answering questions.  Patient was also advised not to drive till his peripheral vision improves.  Antony Contras, MD Medical Director [facility] Pager: [phone] 06/29/2018 1:08 PM       Hospital day # 1     To contact Stroke Continuity provider, please refer to [email or web address] After hours, contact General Neurology

## 2018-06-29 NOTE — Evaluation (Signed)
Speech Language Pathology Evaluation Patient Details Name: Mark Cabrera MRN: [id number] DOB: 1950/05/17 Today's Date: 06/29/2018 Time: 9371-6967 SLP Time Calculation (min) (ACUTE ONLY): 26 min  Problem List:  Patient Active Problem List   Diagnosis Date Noted  . CVA (cerebral vascular accident) ([place]) 06/28/2018  . Hepatic cirrhosis ([place]) 06/29/2017  . Candida albicans infection 01/09/2017  . Healthcare maintenance 12/16/2016  . Lipoma of left upper extremity 09/12/2016  . Chronic knee pain 07/04/2016  . Decreased visual acuity 07/04/2016  . Peripheral neuropathic pain 05/12/2016  . Pericardial effusion a. subxiphoid pericardial window on 04/21/2016 04/28/2016  . Pleural effusion on left, s/p throacentesis 04/18/16 04/28/2016  . 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  . CAD (coronary artery disease), native coronary artery   . Acute diastolic CHF (congestive heart failure) ([place])   . Sleep apnea 08/02/2009  . 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 Dyslipidemia 05/18/2006  . Hypertensive heart disease   . History of dissection of vertebral artery  ([place]) 08/02/2005   Past Medical History:  Past Medical History:  Diagnosis Date  . Acute diastolic congestive heart failure ([place])   . Aortic valve stenosis s/p AVR 2018  . Atrial fibrillation ([place]) - post-op CABG    04/2016 CHA2DS2VAS score = 5  . Atypical nevi   . Coronary artery disease s/p 2 vessel CABG   . Depression    "years ago"  . Diabetes mellitus   . Dyspnea    in the past   . GERD (gastroesophageal reflux disease)   . Heart murmur   . Hepatic cirrhosis ([place]) 06/29/2017  . History of kidney stones   . Hyperlipidemia    hx of transaminitis secondary to statin and he has decided not to use statins secondary to potential side effects.  . Hypertension   . Nephrolithiasis   . Osteoarthritis, knee   . Sleep apnea     Central apnea. Not using cpap   . Stroke [facility]) 2007  . Transaminitis     Statin-induced  . Vertebral artery dissection ([place]) 2007    medullary stroke/PICA,  no significant carotid disease on Dopplers. 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 2-D echo was normal no embolic source found.   Past Surgical History:  Past Surgical History:  Procedure Laterality Date  . AORTIC VALVE REPLACEMENT N/A 03/30/2016   Procedure: AORTIC VALVE REPLACEMENT (AVR);  Surgeon: [name], MD;  Location: [place];  Service: Open Heart Surgery;  Laterality: N/A;  . CARDIAC CATHETERIZATION N/A 03/15/2016   Procedure: Right/Left Heart Cath and Coronary Angiography;  Surgeon: Burnell Blanks, MD;  Location: [place] CV [facility];  Service: Cardiovascular;  Laterality: N/A;  . CLIPPING OF ATRIAL APPENDAGE N/A 03/30/2016   Procedure: CLIPPING OF LEFT ATRIAL APPENDAGE;  Surgeon: [name], MD;  Location: [place];  Service: Open Heart Surgery;  Laterality: N/A;  . CORONARY ARTERY BYPASS GRAFT N/A 03/30/2016   Procedure: CORONARY ARTERY BYPASS GRAFTING (CABG) Times Two;  Surgeon: [name], MD;  Location: [place];  Service: Open Heart Surgery;  Laterality: N/A;  . KNEE ARTHROSCOPY W/ PARTIAL MEDIAL MENISCECTOMY  05/12/2005   right, performed by Dr. French Ana for torn medial meniscus.  Marland Kitchen ROTATOR CUFF REPAIR Right 2016  . STERNAL WIRES REMOVAL  N/A 08/13/2017   Procedure: STERNAL WIRES REMOVAL;  Surgeon: [name], MD;  Location: [place];  Service: Thoracic;  Laterality: N/A;  . SUBXYPHOID PERICARDIAL WINDOW N/A 04/21/2016   Procedure: SUBXYPHOID PERICARDIAL WINDOW;  Surgeon: [name], MD;  Location: [place];  Service: Thoracic;  Laterality: N/A;  . TEE WITHOUT CARDIOVERSION N/A 03/30/2016   Procedure: TRANSESOPHAGEAL ECHOCARDIOGRAM (TEE);   Surgeon: [name], MD;  Location: [place];  Service: Open Heart Surgery;  Laterality: N/A;  . TEE WITHOUT CARDIOVERSION N/A 04/21/2016   Procedure: TRANSESOPHAGEAL ECHOCARDIOGRAM (TEE);  Surgeon: [name], MD;  Location: [place];  Service: Thoracic;  Laterality: N/A;   HPI:  Mr. Rhine is a 68 year old man presented to ED with mouth tingling, left sided arm/leg sensory changes and weakness.  He has had a previous stroke 12 years ago which affected the right side as well as swallowing (resolved), language (resolved) and equilibrium. Prior medical history DM2, CAD s/p CABG, short stent of Afib after CABG, AVR, CVA, HFpEF, HTN, and cirrhosis.    Assessment / Plan / Recommendation Clinical Impression  Patient presented to hospital with numbness on left side of face, left hand weakness and issues with ambulation. Prior stroke 12 years ago resulted in swallowing difficulties as well as language issues, but have all resolved. Patient reports continues numbness and sensory issues on his right side from previous stroke. He continues to have numbness of left cheek, but normal sensation inside his mouth. His speech is not dysarthric. He scored 29/30 on the [facility] (2630 or greater being within normal limits). 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 His cognition and language skills are WNL. No speech therapy is recommended at this time.     SLP Assessment  SLP Recommendation/Assessment: Patient does not need any further Speech Lanaguage Pathology Services                     SLP Evaluation Cognition  Overall Cognitive Status: Within Functional Limits for tasks assessed Arousal/Alertness: Awake/alert Orientation Level: Oriented X4 Attention: Focused Focused Attention: Appears intact Memory: Appears intact Awareness: Appears intact Problem Solving: Appears intact Executive  Function: Reasoning Reasoning: Appears intact Safety/Judgment: Appears intact       Comprehension  Auditory Comprehension Overall Auditory Comprehension: Appears within functional limits for tasks assessed Yes/No Questions: Within Functional Limits Commands: Within Functional Limits Conversation: Complex Visual Recognition/Discrimination Discrimination: Within Function Limits Reading Comprehension Reading Status: Within funtional limits    Expression Expression Primary Mode of Expression: Verbal Verbal Expression Overall Verbal Expression: Appears within functional limits for tasks assessed Initiation: No impairment Level of Generative/Spontaneous Verbalization: Conversation Repetition: No impairment Naming: No impairment Pragmatics: No impairment Written Expression Dominant Hand: Right Written Expression: Within Functional Limits   Oral / Motor  Oral Motor/Sensory Function Overall Oral Motor/Sensory Function: Within functional limits Motor Speech Overall Motor Speech: Appears within functional limits for tasks assessed Respiration: Within functional limits Phonation: Normal Resonance: Within functional limits Articulation: Within functional limitis Intelligibility: Intelligible Motor Planning: Witnin functional limits Motor Speech Errors: Not applicable   GO                    Charlynne Cousins Leander Tout, MA, CCC-SLP 06/29/2018 2:09 PM

## 2018-06-30 DIAGNOSIS — I4891 Unspecified atrial fibrillation: Secondary | ICD-10-CM

## 2018-06-30 DIAGNOSIS — E119 Type 2 diabetes mellitus without complications: Secondary | ICD-10-CM

## 2018-06-30 LAB — GLUCOSE, CAPILLARY
Glucose-Capillary: 220 mg/dL — ABNORMAL HIGH (ref 70–99)
Glucose-Capillary: 276 mg/dL — ABNORMAL HIGH (ref 70–99)
Glucose-Capillary: 158 mg/dL — ABNORMAL HIGH (ref 70–99)
Glucose-Capillary: 128 mg/dL — ABNORMAL HIGH (ref 70–99)

## 2018-06-30 LAB — CBC
HCT: 35.7 % — ABNORMAL LOW (ref 39.0–52.0)
Hemoglobin: 12.1 g/dL — ABNORMAL LOW (ref 13.0–17.0)
MCH: 29.7 pg (ref 26.0–34.0)
MCHC: 33.9 g/dL (ref 30.0–36.0)
MCV: 87.5 fL (ref 80.0–100.0)
Platelets: 163 10*3/uL (ref 150–400)
RBC: 4.08 MIL/uL — ABNORMAL LOW (ref 4.22–5.81)
RDW: 12.6 % (ref 11.5–15.5)
WBC: 3 10*3/uL — ABNORMAL LOW (ref 4.0–10.5)
nRBC: 0 % (ref 0.0–0.2)

## 2018-06-30 LAB — ECHOCARDIOGRAM COMPLETE
Height: 68 in
Weight: 3287.5 oz

## 2018-06-30 LAB — BASIC METABOLIC PANEL
Anion gap: 9 (ref 5–15)
BUN: 13 mg/dL (ref 8–23)
CO2: 24 mmol/L (ref 22–32)
Calcium: 8.9 mg/dL (ref 8.9–10.3)
Chloride: 104 mmol/L (ref 98–111)
Creatinine, Ser: 0.84 mg/dL (ref 0.61–1.24)
GFR calc Af Amer: >60 mL/min (ref >60)
GFR calc non Af Amer: >60 mL/min (ref >60)
Glucose, Bld: 193 mg/dL — ABNORMAL HIGH (ref 70–99)
Potassium: 3.6 mmol/L (ref 3.5–5.1)
Sodium: 137 mmol/L (ref 135–145)

## 2018-06-30 MED ORDER — MAGNESIUM CHLORIDE 64 MG PO TBEC
2.0000 | DELAYED_RELEASE_TABLET | Freq: Once | ORAL | Status: AC
  Administered 2018-06-30: 11:00:00 128 mg via ORAL
  Filled 2018-06-30: qty 2

## 2018-06-30 MED ORDER — INSULIN ASPART 100 UNIT/ML ~~LOC~~ SOLN
12.0000 [IU] | Freq: Three times a day (TID) | SUBCUTANEOUS | Status: DC
  Administered 2018-06-30 – 2018-07-01 (×4): 12 [IU] via SUBCUTANEOUS

## 2018-06-30 MED ORDER — INSULIN GLARGINE 100 UNIT/ML ~~LOC~~ SOLN
18.0000 [IU] | Freq: Two times a day (BID) | SUBCUTANEOUS | Status: DC
  Administered 2018-06-30 – 2018-07-01 (×3): 18 [IU] via SUBCUTANEOUS
  Filled 2018-06-30 (×5): qty 0.18

## 2018-06-30 MED ORDER — INSULIN ASPART 100 UNIT/ML ~~LOC~~ SOLN: 14.0000 [IU] | Freq: Three times a day (TID) | SUBCUTANEOUS | Status: DC

## 2018-06-30 MED ORDER — LORAZEPAM 2 MG/ML IJ SOLN: 1.0000 mg | Freq: Once | INTRAMUSCULAR | Status: DC

## 2018-06-30 MED ORDER — INSULIN GLARGINE 100 UNIT/ML ~~LOC~~ SOLN
20.0000 [IU] | Freq: Two times a day (BID) | SUBCUTANEOUS | Status: DC
  Filled 2018-06-30 (×3): qty 0.2

## 2018-06-30 MED ORDER — POTASSIUM CHLORIDE CRYS ER 20 MEQ PO TBCR
20.0000 meq | EXTENDED_RELEASE_TABLET | Freq: Every day | ORAL | Status: DC
  Administered 2018-06-30 – 2018-07-01 (×2): 20 meq via ORAL
  Filled 2018-06-30 (×2): qty 1

## 2018-06-30 MED ORDER — ENOXAPARIN SODIUM 40 MG/0.4ML ~~LOC~~ SOLN
40.0000 mg | Freq: Every day | SUBCUTANEOUS | Status: DC
  Administered 2018-06-30 – 2018-07-01 (×2): 40 mg via SUBCUTANEOUS
  Filled 2018-06-30 (×2): qty 0.4

## 2018-06-30 NOTE — Progress Notes (Signed)
Physical Therapy Treatment Patient Details Name: Mark Cabrera MRN: [id number] DOB: 1951-01-22 Today's Date: 06/30/2018    History of Present Illness Pt is a 68 y/o male with a PMH significant for CVA (residual R side deficits), DM, CAD, a-fib, CHF. He presents with stroke-like symptoms and MRI revealed small acute infarcts in the medial right temporal lobe and right occipital lobe.     PT Comments    Pt with setback yesterday that has affected function in that he has significantly decreased sensation LUE and LLE. Needing RW and consistent min A to ambulate 125' today. Changing recommendation from [facility] to CIR for continued rehab before going home. PT will continue to follow.    Follow Up Recommendations  CIR;Supervision/Assistance - 24 hour     Equipment Recommendations  Other (comment)(TBD)    Recommendations for Other Services Rehab consult     Precautions / Restrictions Precautions Precautions: Fall Precaution Comments: Prior R side deficits from last stroke Restrictions Weight Bearing Restrictions: No    Mobility  Bed Mobility               General bed mobility comments: pt received in recliner  Transfers Overall transfer level: Needs assistance Equipment used: Rolling walker (2 wheeled) Transfers: Sit to/from Stand Sit to Stand: Min assist         General transfer comment: min A to steady and pt needing support L side  Ambulation/Gait Ambulation/Gait assistance: Min assist Gait Distance (Feet): 125 Feet Assistive device: Rolling walker (2 wheeled) Gait Pattern/deviations: Step-through pattern;Decreased stride length;Drifts right/left;Steppage;Decreased weight shift to left Gait velocity: Decreased Gait velocity interpretation: <1.31 ft/sec, indicative of household ambulator General Gait Details: continues with steppage gait and difficulty discerning when his foot is safely on the floor. Lean to R side for more support and heavy use of RW   Stairs              Wheelchair Mobility    Modified Rankin (Stroke Patients Only) Modified Rankin (Stroke Patients Only) Pre-Morbid Rankin Score: Moderate disability Modified Rankin: Moderately severe disability     Balance Overall balance assessment: Needs assistance Sitting-balance support: Feet supported;No upper extremity supported Sitting balance-Leahy Scale: Fair     Standing balance support: No upper extremity supported Standing balance-Leahy Scale: Poor Standing balance comment: Pt requires min - mod A for static standing                High Level Balance Comments: performed strain/ counterstrain activities in sitting and minimally in standing.             Cognition Arousal/Alertness: Awake/alert Behavior During Therapy: WFL for tasks assessed/performed Overall Cognitive Status: Impaired/Different from baseline Area of Impairment: Safety/judgement                         Safety/Judgement: Decreased awareness of safety;Decreased awareness of deficits            Exercises General Exercises - Lower Extremity Long Arc Quad: AROM;Left;10 reps;Seated Heel Slides: AROM;Left;10 reps;Seated Hip Flexion/Marching: 20 reps;Standing Mini-Sqauts: AROM;10 reps;Standing    General Comments General comments (skin integrity, edema, etc.): worked on sit to stand from low surface of toilet as well as standing balance at sink with lean against counter but no UE support, required consistent min A.       Pertinent Vitals/Pain Pain Assessment: No/denies pain    Home Living  Prior Function            PT Goals (current goals can now be found in the care plan section) Acute Rehab PT Goals Patient Stated Goal: to get back to normal  PT Goal Formulation: With patient Time For Goal Achievement: 07/06/18 Potential to Achieve Goals: Good Progress towards PT goals: Not progressing toward goals - comment(setback with extension of CVA)     Frequency    Min 4X/week      PT Plan Discharge plan needs to be updated    Co-evaluation              AM-PAC PT "6 Clicks" Mobility   Outcome Measure  Help needed turning from your back to your side while in a flat bed without using bedrails?: A Little Help needed moving from lying on your back to sitting on the side of a flat bed without using bedrails?: A Little Help needed moving to and from a bed to a chair (including a wheelchair)?: A Little Help needed standing up from a chair using your arms (e.g., wheelchair or bedside chair)?: A Lot Help needed to walk in hospital room?: A Lot Help needed climbing 3-5 steps with a railing? : Total 6 Click Score: 14    End of Session Equipment Utilized During Treatment: Gait belt Activity Tolerance: Patient tolerated treatment well Patient left: in chair;with call bell/phone within reach Nurse Communication: Mobility status PT Visit Diagnosis: Unsteadiness on feet (R26.81);Other symptoms and signs involving the nervous system (R29.898)     Time: 7741-4239 PT Time Calculation (min) (ACUTE ONLY): 34 min  Charges:  $Gait Training: 8-22 mins $Neuromuscular Re-education: 8-22 mins                     Leighton Roach, [place]  Pager [phone] Office [place] 06/30/2018, 1:55 PM

## 2018-06-30 NOTE — Discharge Summary (Signed)
Name: Mark Cabrera MRN: [id number] DOB: 09-27-1950 68 y.o. PCP: Valinda Party, DO  Date of Admission: 06/28/2018  4:37 PM Date of Discharge: 07/01/18 Attending Physician: [name], MD  Discharge Diagnosis: 1. Right Temporal/Occipital CVA 2. Type II Diabetes  Discharge Medications: Allergies as of 07/01/2018      Reactions   Penicillins Other (See Comments)   UNSPECIFIED REACTIONS Has patient had a PCN reaction causing immediate rash, facial/tongue/throat swelling, SOB or lightheadedness with hypotension: Unk Has patient had a PCN reaction causing severe rash involving mucus membranes or skin necrosis: Unk Has patient had a PCN reaction that required hospitalization: Unk Has patient had a PCN reaction occurring within the last 10 years: No If all of the above answers are "NO", then may proceed with Cephalosporin use.   Statins Other (See Comments)   Severe rash and back pain   Morphine And Related Other (See Comments)   hallucinations   Sglt2 Inhibitors Rash   Candidiasis infection prone      Medication List    TAKE these medications   acetaminophen 325 MG tablet Commonly known as:  TYLENOL Take 2 tablets (650 mg total) by mouth every 4 (four) hours as needed for headache or mild pain.   apixaban 5 MG Tabs tablet Commonly known as:  ELIQUIS Take 1 tablet (5 mg total) by mouth 2 (two) times daily.   aspirin EC 81 MG tablet Take 1 tablet (81 mg total) by mouth daily.   Bayer Microlet Lancets lancets Check blood sugar 3 times a day as instructed   carvedilol 3.125 MG tablet Commonly known as:  COREG Take 1 tablet (3.125 mg total) by mouth 2 (two) times daily with a meal. TAKE 1 TABLET BY MOUTH TWICE DAILY WITH A MEAL Start taking on:  July 03, 2018 What changed:    See the new instructions.  These instructions start on July 03, 2018. If you are unsure what to do until then, ask your doctor or other care provider.   Evolocumab 140 MG/ML  Soaj Commonly known as:  Repatha SureClick Inject 196 mg into the skin every 30 (thirty) days.   furosemide 40 MG tablet Commonly known as:  LASIX TAKE 1 TABLET BY MOUTH ONCE DAILY IN THE MORNING 2 TABLETS EVERY NIGHT. PLEASE KEEP UPCOMING APPT IN NOVEMBER.   gabapentin 100 MG capsule Commonly known as:  NEURONTIN Take 1 capsule (100 mg total) by mouth 3 (three) times daily. What changed:  additional instructions   glucose blood test strip Commonly known as:  Contour Next Test Check blood sugar 3 times a day as instructed   ibuprofen 200 MG tablet Commonly known as:  ADVIL Take 400 mg by mouth every 6 (six) hours as needed for headache or moderate pain.   insulin glargine 100 UNIT/ML injection Commonly known as:  Lantus Inject 0.18 mLs (18 Units total) into the skin 2 (two) times daily. Taking 30 units twice daily What changed:    how much to take  when to take this   insulin lispro 100 UNIT/ML cartridge Commonly known as:  HumaLOG Inject 0.12 mLs (12 Units total) into the skin 3 (three) times daily with meals. Please take 15 U twice daily with your two largest meals at least four hours apart What changed:    how much to take  when to take this   nitroGLYCERIN 0.4 MG SL tablet Commonly known as:  NITROSTAT Place 1 tablet (0.4 mg total) under the tongue every  5 (five) minutes as needed for chest pain.   Pen Needles 31G X 5 MM Misc 1 each by Does not apply route 3 (three) times daily.   potassium chloride SA 20 MEQ tablet Commonly known as:  K-DUR TAKE 1 TABLET BY MOUTH ONCE DAILY   VITAMIN C PO Take 1 tablet by mouth daily.       Disposition and follow-up:   Mark Cabrera was discharged from [facility] in Good condition.  At the hospital follow up visit please address:  1.  Right Temporal Occipital CVA: with history and occlusion location on CTA, thought to be 2/2 A. Fib. Started on Eliquis at discharge. Allergic to statins and provided  Repatha at discharge. Deficits prior to discharge to CIR included: decreased sensation on entire left side of face and LLE, Strength 4+/5 in LUE and LLE  Hold home coreg to allow for permissive hypertension, please resume coreg on 4/22  New meds at discharge: eliquis and Repatha     Type II DM: HgbA1C has increased from 7.6 to 12 in the past three months. He has been taking Lantus 20U bid and Novolog 15U TID but chart does not reflect this. CBGs better controlled with lantus 18U bid and novolog 12U TID with meals. Consider CGM at follow up. Inquire about his storage of insulin (doesn't have a refrigerator at home)  2.  Labs / imaging needed at time of follow-up: none  3.  Pending labs/ test needing follow-up: none   Follow-up Appointments: Follow-up Information    Kalman Shan Ratliff, DO Follow up in 1 week(s).   Specialty:  Internal Medicine Why:  Schedule a telehealth visit for 1 week after discharge Contact information: [place] [address] [phone]        Burnell Blanks, MD .   Specialty:  Cardiology Contact information: 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 The symptoms nearly resolved prior to admission. CT was normal and MRI showed small acute infarcts in the right temporal lobe and right occipital lobe and CTA showed occlusion of the proximal right P2 segment of the vertebral artery. Neurology was consulted and recommended anticoagulation for likely paroxysmal a.  Fib considering his previous a. Fib after CABG and distribution of occlusion. He was discharged on eliquis. He has a history of transaminitis on statins and was started on PCSK9.   Patient self normalized blood pressure intermittently and on day 2 developed recurrence of symptoms with left sided ataxia, numbness, and weakness. CT showed progression of infarct. He was bolused 1L NS with increase in blood pressure and symptoms improved. He was discharged to CIR and deficits at discharge include decreased sensation on entire left side of face and LLE, Strength 4+/5 in LUE and LLE  Type II Diabetes Mark Cabrera takes 20U lantus bid and novolog 15U tid before meals. His hemoglobin a1c at admission was 12 but 7.6 three months ago. He states he stores his insulin in a boat near his home. Discharged on lantus 18U bid  and novolog 12U TID with meals  Discharge Vitals:   BP 126/67 (BP Location: Right Arm)   Pulse 62   Temp 98.1 F (36.7 C) (Oral)   Resp 17   Ht 5\' 8"  (1.727 m)   Wt 93.2 kg   SpO2 98%   BMI 31.24 kg/m   Pertinent Labs, Studies, and Procedures:    CT Head 4/18 IMPRESSION: 1. Negative for hemorrhagic transformation of the right PCA territory ischemia, but increased hypodensity in the right occipital lobe white matter and right hippocampus since yesterday. 2. No new intracranial abnormality. Electronically Signed   By: Genevie Ann M.D.   On: 06/29/2018 19:54  MRI 4/17 IMPRESSION: 1. Small acute infarcts in the mesial right temporal lobe and right occipital lobe. 2. Chronic left basal ganglia lacunar infarct, new from 2014. Electronically Signed   By: Logan Bores M.D.   On: 06/28/2018 20:18  CTA Head & Neck 4/17 IMPRESSION: 1. Occlusion proximal right P2 segment. Corresponding areas of delayed perfusion in the right posterior cerebral artery involving the right hippocampus, thalamus, and right occipital lobe. MRI recommended to evaluate for acute infarct in these areas. 2.  Mild atherosclerotic disease in the carotid artery bilaterally 3. Dominant right vertebral artery with mild-to-moderate stenosis proximally and mild stenosis distally. Non dominant left vertebral artery with occlusion left V4 segment. 4. These results were called by telephone at the time of interpretation on 06/28/2018 at 5:38 pm to Dr. Roland Rack , who verbally acknowledged these results. Electronically Signed   By: Franchot Gallo M.D.   On: 06/28/2018 17:40  CT Head 4/17 IMPRESSION: 1. No acute abnormality 2. ASPECTS is 10 Electronically Signed   By: Franchot Gallo M.D.   On: 06/28/2018 17:05  Echo:  1. The left ventricle has low normal systolic function, with an ejection fraction of 50-55%. The cavity size was mildly dilated. There is severely increased left ventricular wall thickness. Left ventricular diastolic Doppler parameters are consistent  with pseudonormalization. Elevated left ventricular end-diastolic pressure.  2. The right ventricle has normal systolic function. The cavity was normal. There is no increase in right ventricular wall thickness.  3. Left atrial size was severely dilated.  4. Right atrial size was mildly dilated.  5. The mitral valve is degenerative. Mild thickening of the mitral valve leaflet. Mild calcification of the mitral valve leaflet.  6. The aortic valve was not well visualized. Moderate thickening of the aortic valve. Moderate calcification of the aortic valve. Mild stenosis of the aortic valve.  7. The interatrial septum was not well visualized.   Discharge Instructions: Discharge Instructions    Diet - low sodium heart healthy   Complete by:  As directed    Discharge instructions   Complete by:  As directed    Mark Cabrera, Mark Cabrera were admitted to the hospital because you had a stroke. Hopefully you will make a speedy and complete recovery during rehab. Since your stroke was most likely caused by your atrial fibrillation, we have started  you on a blood thinner called Eliquis (apixaban). You will take this medicine twice a day for the foreseeable future in order to decrease your chances of having another stroke. We have also started you on a new cholesterol lowering medication called Repatha. It is a once monthly injection.   For these next few days, we are going to continue holding one of your blood pressure medicines (coreg or carvedilol) to prevent your blood pressure from dropping to quickly. You can  begin taking coreg again on Wednesday, 4/22. When you go home, please continue checking your blood sugars. I will have someone from the clinic call you to see how things are going. You will also follow up with the stroke doctor in 6 weeks.   If you notice worsening weakness, confusion, vision changes, difficulty walking please come back to the hospital.   Increase activity slowly   Complete by:  As directed       Signed: Isabelle Course, MD 07/01/2018, 1:37 PM   Pager: [phone]

## 2018-06-30 NOTE — Progress Notes (Signed)
Subjective:  He is feeling well this morning and feels that his strength is somewhat improved although he still is having numbness and weakness in his left arm. He would like to get out of bed and work with physical therapy. He does state he is having leg cramps and takes potassium when this occurs which helps.   Objective:  Vital signs in last 24 hours: Vitals:   06/29/18 1528 06/29/18 2002 06/29/18 2346 06/30/18 0320  BP: 123/71 (!) 142/74 (!) 161/82 (!) 162/85  Pulse: 61 67 70 69  Resp: 20 18 19 20   Temp: 97.8 F (36.6 C) 98.7 F (37.1 C) 98.1 F (36.7 C) 98.3 F (36.8 C)  TempSrc: Oral Oral Oral   SpO2: 98% 99% 96% 99%  Weight:      Height:       Constitution: NAD, sitting up in bed, normal affect HEENT: EOM intact, no icterus or injection, left homonymous hemianopsia, PERRLA Cardio: RRR, no m/r/g Respiratory: CTA, no w/r/r Neuro: left UE dysmetria present but improved, LLE knee Skin: c/d/i    Assessment/Plan:  Active Problems:   CVA (cerebral vascular accident) ([place])  68yo man with PMH insulin dependent DMII, CAD s/p CABG, afib not on anticoagulation, aortic valve stenosis s/p AVR, diastolic heart failure, HTN, HLD, OSA, hepatic cirrhosis, and 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   Right Temporal/Occipital CVA Patients symptoms of stroke had initially resolved at admission. Yesterday, he began to have recurrence of symptoms on his left side and repeat CT showed increased hypodensity of the right hippocampus and occipital lobe. This progression is likely secondary to him becoming normotensive. He was bolused 1L. Symptoms improved today although still increased left sided deficits compared to admission. Echo shows EF 50-55.   - continue permissive HTN, will bolus if decrease in blood pressure - PT/OT ordered - CIR vs HH - he seems to prefer  staying with his sister for several weeks with home health currently. Will need Cane, 3 in 1 bedside commode if d/c with HH - transaminitis secondary to statin therapy in 2009. Care Management consulted to try to start PCSK9 - per neurology he will need anticoagulation with his history of A. Fib after CABG as stroke was emoblic event based on vertebral artery occlusion location - start eliquis prior to discharge - cont asa 81 mg - switched to SCDs yesterday for recurrence of stroke symptoms - will restart VTE prophylaxis today   TIIDM Hemoglobin a1c 12 at admission, 7.6 in early January. He stores his medications on a boat and does not have a refrigerator to store his insulin in. With his significant increase in A1c I am concerned that his lantus is becoming too warm and no longer working as he states he is taking his insulin. He also has had frequent changes in diabetes medications over the last few months and what he states he is taking at home does not necessarily match recent notes. States he is taking 20U lantus bid and 15U novolog TID. If he is able to do qd dosing of Lantus and bid novolog (a regimen he was able to stick to in the past as he usually eats twice a day) a simpler regimen may be easier for him in the long run but will be complicated by him being unable to cool his insulin.   - increase lantus to 18U bid and novolog 12U TID with meals -  hold SSI to assess needed home dose of insulin - cont. Gabapentin 100 mg tid  HTN Diastolic HF CAD s/p CABG and bovine AVR 2/2 aortic steniss  Home medications include coreg 3.125 mg BID and lasix 40 mg qam and 80 mg qpm. - cont. Permissive HTN  Leg Cramps Explained that his potassium is generally to be taken with his lasix, which decreases his serum potassium. K is normal today but provided 25mEq K at his request.   VTE: SCDs IVF: none Diet: heart healthy/ carb modified Code: full  Dispo: Anticipated discharge in approximately 1-2 days.    Molli Hazard A, DO 06/30/2018, 5:41 AM Pager: [phone]

## 2018-06-30 NOTE — Progress Notes (Signed)
  Date: 06/30/2018  Patient name: Mark Cabrera  Medical record number: [id number]  Date of birth: 01-17-1951   I have seen and evaluated this patient and I have discussed the plan of care with the house staff. Please see their note for complete details. I concur with their findings with the following additions/corrections: Mr. Troop was seen this morning on team rounds.  He feels that the weakness that occurred yesterday in the left arm is improved.  He intends to live with his sister temporarily after discharge.  Cardiology feels his stroke was embolic due to known A. fib after his CABG and a dilated left atrium on echo this admission.  We will start Eliquis yesterday.  I want to ensure that his blood pressure will not decrease in because another episode of weakness like he had yesterday.  He is intolerant of statins and the team is working with care management to get him to a PCSK9.  His A1c was 12 on admission although previously it was 7.6 in January.  The team had several possible etiologies for this including improper insulin storage, altered diet.  We can offer a CGM prior to discharge to help tailor his insulin regimen.  Possible DC in the a.m.  [name], MD 06/30/2018, 12:34 PM

## 2018-06-30 NOTE — Progress Notes (Signed)
STROKE TEAM PROGRESS NOTE    SUBJECTIVE (INTERVAL HISTORY) The patient states he is doing well.  His symptoms have resolved.  He has no new complaints today.  Echocardiogram shows normal ejection fraction without obvious cardiac source of embolism.  OBJECTIVE Vitals:   06/29/18 2346 06/30/18 0320 06/30/18 0733 06/30/18 1132  BP: (!) 161/82 (!) 162/85 (!) 162/89 (!) 141/80  Pulse: 70 69 (!) 58 63  Resp: 19 20 20 20   Temp: 98.1 F (36.7 C) 98.3 F (36.8 C) 97.6 F (36.4 C) (!) 97.4 F (36.3 C)  TempSrc: Oral  Oral Oral  SpO2: 96% 99% 99% 100%  Weight:      Height:        CBC:  Recent Labs  Lab 06/28/18 1658 06/30/18 0517  WBC 4.8 3.0*  NEUTROABS 2.9  --   HGB 13.7 12.1*  HCT 41.7 35.7*  MCV 87.6 87.5  PLT 226 527    Basic Metabolic Panel:  Recent Labs  Lab 06/28/18 1658 06/30/18 0517  NA 138 137  K 3.8 3.6  CL 102 104  CO2 25 24  GLUCOSE 196* 193*  BUN 11 13  CREATININE 0.90 0.84  CALCIUM 9.5 8.9    Lipid Panel:     Component Value Date/Time   CHOL 274 (H) 06/29/2018 0420   CHOL 323 (H) 01/18/2018 0900   TRIG 242 (H) 06/29/2018 0420   HDL 45 06/29/2018 0420   HDL 52 01/18/2018 0900   CHOLHDL 6.1 06/29/2018 0420   VLDL 48 (H) 06/29/2018 0420   LDLCALC 181 (H) 06/29/2018 0420   LDLCALC 223 (H) 01/18/2018 0900   HgbA1c:  Lab Results  Component Value Date   HGBA1C 12.0 (H) 06/29/2018   Urine Drug Screen: No results found for: LABOPIA, COCAINSCRNUR, LABBENZ, AMPHETMU, THCU, LABBARB  Alcohol Level No results found for: ETH  IMAGING  Ct Angio Head W Or Wo Contrast Ct Angio Neck W Or Wo Contrast Ct Cerebral Perfusion W Contrast 06/28/2018 IMPRESSION:  1. Occlusion proximal right P2 segment. Corresponding areas of delayed perfusion in the right posterior cerebral artery involving the right hippocampus, thalamus, and right occipital lobe. MRI recommended to evaluate for acute infarct in these areas.  2. Mild atherosclerotic disease in the carotid  artery bilaterally  3. Dominant right vertebral artery with mild-to-moderate stenosis proximally and mild stenosis distally. Non dominant left vertebral artery with occlusion left V4 segment.    Mr Brain Wo Contrast 06/28/2018 IMPRESSION:  1. Small acute infarcts in the mesial right temporal lobe and right occipital lobe.  2. Chronic left basal ganglia lacunar infarct, new from 2014.     Ct Head Code Stroke Wo Contrast 06/28/2018 IMPRESSION:  1. No acute abnormality  2. ASPECTS is 10    Transthoracic Echocardiogram  Ejection fraction 50 to 55%.  Left atrium severely dilated.  Degenerative changes and thickening of mitral and aortic valves.    EKG - SR rate 60 BPM. (See cardiology reading for complete details)    PHYSICAL EXAM Blood pressure (!) 141/80, pulse 63, temperature (!) 97.4 F (36.3 C), temperature source Oral, resp. rate 20, height 5\' 8"  (1.727 m), weight 93.2 kg, SpO2 100 %.   . Afebrile. Head is nontraumatic. Neck is supple without bruit.    Cardiac exam no murmur or gallop. Lungs are clear to auscultation. Distal pulses are well felt.  Neurological Exam :  Awake alert oriented x3 with normal speech and language function.  No dysarthria or aphasia.  Extraocular movements are  full range without nystagmus.  Dense left homonymous hemianopsia.  Pupils equal reactive.  Fundi not visualized.  Face is symmetric without weakness.  Tongue midline.  Motor system exam reveals symmetric upper and lower extremity strength without drift but mild weakness of left grip and intrinsic hand muscles.  Orbits right over left upper extremity.  Mild left hip flexor weakness.  Diminished left body touch pinprick sensation.  Coordination slow but accurate.  Gait not tested.    ASSESSMENT/PLAN Mark Cabrera is a 68 y.o. male with history of postop atrial fibrillation not on anticoagulation, and vertebral artery dissection presenting with left-sided weakness and visual change that was  present on awakening this morning.Marland Kitchen He did not receive IV t-PA due to late presentation.  Stroke: Small acute infarcts in the mesial right temporal lobe and right occipital lobe - embolic -given prior history of atrial fibrillation and dilated left atrium likely cardioembolic  Resultant left homonymous hemianopsia and mild diminished sensation  CT head - No acute abnormality   MRI head - Small acute infarcts in the mesial right temporal lobe and right occipital lobe.   MRA head - not performed  CTA H&N - Occlusion proximal right P2 segment. Dominant right vertebral artery with mild-to-moderate stenosis.   Carotid Doppler - CTA neck performed - carotid dopplers not indicated.  2D Echo -ejection fraction 50 to 55%.  Severely dilated left atrium   LDL - 181  HgbA1c - 12  UDS - not performed  VTE prophylaxis - Lovenox  Diet - Heart healthy / carb modified with thin liquids.  aspirin 81 mg daily prior to admission, now on aspirin 81 mg daily  Patient counseled to be compliant with his antithrombotic medications  Ongoing aggressive stroke risk factor management Therapy recommendations: Home health PT/OT disposition: Home Hypertension  Stable . Permissive hypertension (OK if < 220/120) but gradually normalize in 5-7 days . Long-term BP goal normotensive  Hyperlipidemia  Lipid lowering medication PTA: Crestor 10 mg daily  LDL 181, goal < 70  Current lipid lowering medication: none - consider Lipitor 80 mg daily  Continue statin at discharge  Diabetes  HgbA1c 12, goal < 7.0  Uncontrolled  Other Stroke Risk Factors  Advanced age  ETOH use, advised to drink no more than 1 alcoholic beverage per day.  Obesity, Body mass index is 31.24 kg/m., recommend weight loss, diet and exercise as appropriate   Hx stroke/TIA  Coronary artery disease  Obstructive sleep apnea, not on CPAP at home  Atrial fibrillation - post CABG.      He presented with left-sided  weakness and numbness secondary to embolic right PCA infarct.  He has prior history of left PICA infarct in 2007 and given history of transient postop A. fib he likely needs to be on long-term anticoagulation.  Recommend Eliquis after pharmacy consult.   Aggressive risk factor modification.  Patient has history of statin intolerance and will benefit with the new PCSK9 inhibitor injections like Repatha or Praluent.  Patient counseled to be compliant with using CPAP at home for his sleep apnea.   Follow-up as an outpatient in the stroke clinic in 6 weeks.  Stroke team will sign off.  Kindly call for questions. Antony Contras, MD Medical Director [facility] Pager: [phone] 06/30/2018 12:10 PM       Hospital day # 2     To contact Stroke Continuity provider, please refer to [email or web address] After hours, contact General Neurology

## 2018-07-01 ENCOUNTER — Other Ambulatory Visit: Payer: Self-pay

## 2018-07-01 ENCOUNTER — Encounter (HOSPITAL_COMMUNITY): Payer: Self-pay

## 2018-07-01 ENCOUNTER — Inpatient Hospital Stay (HOSPITAL_COMMUNITY)
Admission: RE | Admit: 2018-07-01 | Discharge: 2018-07-09 | DRG: 057 | Disposition: A | Discharge status: Home Health | Payer: Medicare Other | Source: Intra-hospital | Attending: Physical Medicine & Rehabilitation | Admitting: Physical Medicine & Rehabilitation

## 2018-07-01 DIAGNOSIS — G8929 Other chronic pain: Secondary | ICD-10-CM

## 2018-07-01 DIAGNOSIS — I4891 Unspecified atrial fibrillation: Secondary | ICD-10-CM | POA: Diagnosis present

## 2018-07-01 DIAGNOSIS — Z880 Allergy status to penicillin: Secondary | ICD-10-CM

## 2018-07-01 DIAGNOSIS — Z794 Long term (current) use of insulin: Secondary | ICD-10-CM

## 2018-07-01 DIAGNOSIS — K7460 Unspecified cirrhosis of liver: Secondary | ICD-10-CM | POA: Diagnosis present

## 2018-07-01 DIAGNOSIS — Z885 Allergy status to narcotic agent status: Secondary | ICD-10-CM

## 2018-07-01 DIAGNOSIS — I110 Hypertensive heart disease with heart failure: Secondary | ICD-10-CM | POA: Diagnosis present

## 2018-07-01 DIAGNOSIS — J392 Other diseases of pharynx: Secondary | ICD-10-CM | POA: Diagnosis not present

## 2018-07-01 DIAGNOSIS — I5032 Chronic diastolic (congestive) heart failure: Secondary | ICD-10-CM | POA: Diagnosis present

## 2018-07-01 DIAGNOSIS — M75102 Unspecified rotator cuff tear or rupture of left shoulder, not specified as traumatic: Secondary | ICD-10-CM

## 2018-07-01 DIAGNOSIS — M25512 Pain in left shoulder: Secondary | ICD-10-CM | POA: Diagnosis not present

## 2018-07-01 DIAGNOSIS — E1169 Type 2 diabetes mellitus with other specified complication: Secondary | ICD-10-CM

## 2018-07-01 DIAGNOSIS — F419 Anxiety disorder, unspecified: Secondary | ICD-10-CM | POA: Diagnosis present

## 2018-07-01 DIAGNOSIS — Z952 Presence of prosthetic heart valve: Secondary | ICD-10-CM

## 2018-07-01 DIAGNOSIS — Z888 Allergy status to other drugs, medicaments and biological substances status: Secondary | ICD-10-CM

## 2018-07-01 DIAGNOSIS — I69354 Hemiplegia and hemiparesis following cerebral infarction affecting left non-dominant side: Principal | ICD-10-CM

## 2018-07-01 DIAGNOSIS — Z7982 Long term (current) use of aspirin: Secondary | ICD-10-CM | POA: Diagnosis not present

## 2018-07-01 DIAGNOSIS — K219 Gastro-esophageal reflux disease without esophagitis: Secondary | ICD-10-CM | POA: Diagnosis present

## 2018-07-01 DIAGNOSIS — R52 Pain, unspecified: Secondary | ICD-10-CM

## 2018-07-01 DIAGNOSIS — Z87442 Personal history of urinary calculi: Secondary | ICD-10-CM | POA: Diagnosis not present

## 2018-07-01 DIAGNOSIS — F329 Major depressive disorder, single episode, unspecified: Secondary | ICD-10-CM | POA: Diagnosis present

## 2018-07-01 DIAGNOSIS — Z951 Presence of aortocoronary bypass graft: Secondary | ICD-10-CM

## 2018-07-01 DIAGNOSIS — Z79899 Other long term (current) drug therapy: Secondary | ICD-10-CM

## 2018-07-01 DIAGNOSIS — M7532 Calcific tendinitis of left shoulder: Secondary | ICD-10-CM | POA: Diagnosis present

## 2018-07-01 DIAGNOSIS — M1710 Unilateral primary osteoarthritis, unspecified knee: Secondary | ICD-10-CM | POA: Diagnosis present

## 2018-07-01 DIAGNOSIS — E1142 Type 2 diabetes mellitus with diabetic polyneuropathy: Secondary | ICD-10-CM

## 2018-07-01 DIAGNOSIS — Z7901 Long term (current) use of anticoagulants: Secondary | ICD-10-CM | POA: Diagnosis not present

## 2018-07-01 DIAGNOSIS — Z791 Long term (current) use of non-steroidal anti-inflammatories (NSAID): Secondary | ICD-10-CM | POA: Diagnosis not present

## 2018-07-01 DIAGNOSIS — I10 Essential (primary) hypertension: Secondary | ICD-10-CM

## 2018-07-01 DIAGNOSIS — E785 Hyperlipidemia, unspecified: Secondary | ICD-10-CM | POA: Diagnosis present

## 2018-07-01 DIAGNOSIS — G4731 Primary central sleep apnea: Secondary | ICD-10-CM | POA: Diagnosis present

## 2018-07-01 DIAGNOSIS — Z833 Family history of diabetes mellitus: Secondary | ICD-10-CM

## 2018-07-01 DIAGNOSIS — I482 Chronic atrial fibrillation, unspecified: Secondary | ICD-10-CM

## 2018-07-01 DIAGNOSIS — I2510 Atherosclerotic heart disease of native coronary artery without angina pectoris: Secondary | ICD-10-CM | POA: Diagnosis present

## 2018-07-01 DIAGNOSIS — M19012 Primary osteoarthritis, left shoulder: Secondary | ICD-10-CM | POA: Diagnosis present

## 2018-07-01 DIAGNOSIS — Z8249 Family history of ischemic heart disease and other diseases of the circulatory system: Secondary | ICD-10-CM

## 2018-07-01 DIAGNOSIS — B351 Tinea unguium: Secondary | ICD-10-CM

## 2018-07-01 DIAGNOSIS — Z811 Family history of alcohol abuse and dependence: Secondary | ICD-10-CM

## 2018-07-01 DIAGNOSIS — I639 Cerebral infarction, unspecified: Secondary | ICD-10-CM | POA: Diagnosis not present

## 2018-07-01 LAB — GLUCOSE, CAPILLARY
Glucose-Capillary: 229 mg/dL — ABNORMAL HIGH (ref 70–99)
Glucose-Capillary: 101 mg/dL — ABNORMAL HIGH (ref 70–99)
Glucose-Capillary: 227 mg/dL — ABNORMAL HIGH (ref 70–99)
Glucose-Capillary: 165 mg/dL — ABNORMAL HIGH (ref 70–99)
Glucose-Capillary: 106 mg/dL — ABNORMAL HIGH (ref 70–99)
Glucose-Capillary: 183 mg/dL — ABNORMAL HIGH (ref 70–99)

## 2018-07-01 MED ORDER — ATORVASTATIN CALCIUM 80 MG PO TABS
80.0000 mg | ORAL_TABLET | Freq: Every day | ORAL | Status: DC
  Administered 2018-07-01 – 2018-07-02 (×2): 80 mg via ORAL
  Filled 2018-07-01 (×2): qty 1

## 2018-07-01 MED ORDER — APIXABAN 5 MG PO TABS
5.0000 mg | ORAL_TABLET | Freq: Two times a day (BID) | ORAL | Status: DC
  Administered 2018-07-01: 5 mg via ORAL
  Filled 2018-07-01: qty 1

## 2018-07-01 MED ORDER — EVOLOCUMAB 140 MG/ML ~~LOC~~ SOAJ: 420.0000 mg | SUBCUTANEOUS | Status: DC

## 2018-07-01 MED ORDER — INSULIN GLARGINE 100 UNIT/ML ~~LOC~~ SOLN: 18.0000 [IU] | Freq: Two times a day (BID) | SUBCUTANEOUS | 11 refills | Status: DC

## 2018-07-01 MED ORDER — ATORVASTATIN CALCIUM 80 MG PO TABS: 80.0000 mg | ORAL_TABLET | Freq: Every day | ORAL | 11 refills | Status: DC

## 2018-07-01 MED ORDER — SORBITOL 70 % SOLN: 30.0000 mL | Freq: Every day | Status: DC | PRN

## 2018-07-01 MED ORDER — INSULIN ASPART 100 UNIT/ML ~~LOC~~ SOLN
12.0000 [IU] | Freq: Three times a day (TID) | SUBCUTANEOUS | Status: DC
  Administered 2018-07-02 – 2018-07-09 (×18): 12 [IU] via SUBCUTANEOUS

## 2018-07-01 MED ORDER — ACETAMINOPHEN 650 MG RE SUPP: 650.0000 mg | RECTAL | Status: DC | PRN

## 2018-07-01 MED ORDER — ACETAMINOPHEN 160 MG/5ML PO SOLN: 650.0000 mg | ORAL | Status: DC | PRN

## 2018-07-01 MED ORDER — ONDANSETRON HCL 4 MG PO TABS: 4.0000 mg | ORAL_TABLET | Freq: Three times a day (TID) | ORAL | Status: DC | PRN

## 2018-07-01 MED ORDER — CARVEDILOL 3.125 MG PO TABS: 3.1250 mg | ORAL_TABLET | Freq: Two times a day (BID) | ORAL | Status: DC

## 2018-07-01 MED ORDER — INSULIN GLARGINE 100 UNIT/ML ~~LOC~~ SOLN
18.0000 [IU] | Freq: Two times a day (BID) | SUBCUTANEOUS | Status: DC
  Administered 2018-07-01 – 2018-07-09 (×16): 18 [IU] via SUBCUTANEOUS
  Filled 2018-07-01 (×18): qty 0.18

## 2018-07-01 MED ORDER — GABAPENTIN 100 MG PO CAPS
100.0000 mg | ORAL_CAPSULE | Freq: Three times a day (TID) | ORAL | Status: DC
  Administered 2018-07-01 – 2018-07-09 (×23): 100 mg via ORAL
  Filled 2018-07-01 (×23): qty 1

## 2018-07-01 MED ORDER — APIXABAN 5 MG PO TABS: 5.0000 mg | ORAL_TABLET | Freq: Two times a day (BID) | ORAL | 0 refills | Status: DC

## 2018-07-01 MED ORDER — INSULIN LISPRO 100 UNIT/ML CARTRIDGE: 12.0000 [IU] | Freq: Three times a day (TID) | SUBCUTANEOUS | 11 refills | Status: DC

## 2018-07-01 MED ORDER — ACETAMINOPHEN 325 MG PO TABS
650.0000 mg | ORAL_TABLET | ORAL | Status: DC | PRN
  Administered 2018-07-02 – 2018-07-07 (×4): 650 mg via ORAL
  Filled 2018-07-01 (×6): qty 2

## 2018-07-01 MED ORDER — ASPIRIN EC 81 MG PO TBEC
81.0000 mg | DELAYED_RELEASE_TABLET | Freq: Every day | ORAL | Status: DC
  Administered 2018-07-02 – 2018-07-09 (×8): 81 mg via ORAL
  Filled 2018-07-01 (×8): qty 1

## 2018-07-01 MED ORDER — APIXABAN 5 MG PO TABS
5.0000 mg | ORAL_TABLET | Freq: Two times a day (BID) | ORAL | Status: DC
  Administered 2018-07-01 – 2018-07-09 (×16): 5 mg via ORAL
  Filled 2018-07-01 (×16): qty 1

## 2018-07-01 NOTE — Progress Notes (Signed)
Meredith Staggers, MD  Physician  Physical Medicine and Rehabilitation  PMR Pre-admission  Signed  Date of Service:  07/01/2018 1:39 PM       Related encounter: ED to Hosp-Admission (Discharged) from 06/28/2018 in [facility]      Signed         Show:Clear all '[x]' Manual'[x]' Template'[x]' Copied  Added by: '[x]' Cristina Gong, RN'[x]' Meredith Staggers, MD  '[]' Hover for details PMR Admission Coordinator Pre-Admission Assessment  Patient: Mark Cabrera is an 68 y.o., male MRN: [id number] DOB: [birth year] Height: '5\' 8"'  (172.7 cm) Weight: 93.2 kg  Insurance Information HMO:     PPO:      PCP:      IPA:      80/20:      OTHER:  PRIMARY: Medicare a and b      Policy#: [id number]      Subscriber: pt Benefits:  Phone #: passport one online     Name: 07/01/2018 Eff. Date: 09/10/2013     Deduct: $1408      Out of Pocket Max: none      Life Max: none CIR: 100%      SNF: 20 full days Outpatient: 80%     Co-Pay: 20% Home Health: 100%      Co-Pay: none DME: 80%     Co-Pay: 20% Providers: pt choice  SECONDARY: Medicaid [place] access      Policy#: [id number] n      Subscriber: pt 4/10 [facility]  Medicaid Application Date:       Case Manager:  Disability Application Date:       Case Worker:   The "Data Collection Information Summary" for patients in Inpatient Rehabilitation Facilities with attached "Privacy Act [place] Records" was provided and verbally reviewed with: Patient  Emergency Contact Information         Contact Information    Name Relation Home Work Mobile   [place],[name] Brother   [place], [place] Daughter   [phone]      Current Medical History  Patient Admitting Diagnosis: right temporal/occipital CVA  History of Present Illness: Mark Cabrera is a 68 year old right-handed male with history of diabetes mellitus maintained on Lantus insulin, CAD with CABG/AVR with postoperative atrial fibrillation,vertebral artery  dissection, 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 Presented 06/28/2018 with left-sided weakness and tingling. CT scan negative. Patient did not receive TPA. CT angiogram of head and neck occlusion proximal right P2 segment.echocardiogram with ejection fraction of 50-55% severely dilated left atrium. MRI showed small acute infarct in the mesial right temporal occipital lobe as well as chronic left basal ganglia lacunar infarction. Neurology consulted presently maintained on aspirin as well as Eliquis. Tolerating a regular diet.   Complete NIHSS TOTAL: 5  Patient[facility] medical record from [facility]  has been reviewed by the rehabilitation admission coordinator and physician.  Past Medical History      Past Medical History:  Diagnosis Date  . Acute diastolic congestive heart failure ([place])   . Aortic valve stenosis s/p AVR 2018  . Atrial fibrillation ([place]) - post-op CABG    04/2016 CHA2DS2VAS score = 5  . Atypical nevi   . Coronary artery disease s/p 2 vessel CABG   . Depression    "years ago"  . Diabetes mellitus   . Dyspnea    in the past   . GERD (gastroesophageal reflux disease)   . Heart murmur   . Hepatic cirrhosis (  [place]) 06/29/2017  . History of kidney stones   . Hyperlipidemia    hx of transaminitis secondary to statin and he has decided not to use statins secondary to potential side effects.  . Hypertension   . Nephrolithiasis   . Osteoarthritis, knee   . Sleep apnea     Central apnea. Not using cpap  . Stroke [facility]) 2007  . Transaminitis     Statin-induced  . 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 2-D echo was normal no embolic  source found.    Family History   family history includes Alcohol abuse in his father; Diabetes in his mother; Hypertension in his mother.  Prior Rehab/Hospitalizations Has the patient had prior rehab or hospitalizations prior to admission? yes  Has the patient had major surgery during 100 days prior to admission? No              Current Medications  Current Facility-Administered Medications:  .  acetaminophen (TYLENOL) tablet 650 mg, 650 mg, Oral, Q4H PRN, 650 mg at 06/30/18 0736 **OR** acetaminophen (TYLENOL) solution 650 mg, 650 mg, Per Tube, Q4H PRN **OR** acetaminophen (TYLENOL) suppository 650 mg, 650 mg, Rectal, Q4H PRN, Velna Ochs, MD .  apixaban (ELIQUIS) tablet 5 mg, 5 mg, Oral, BID, [name], MD, 5 mg at 07/01/18 1144 .  aspirin EC tablet 81 mg, 81 mg, Oral, Daily, Velna Ochs, MD, 81 mg at 07/01/18 0931 .  gabapentin (NEURONTIN) capsule 100 mg, 100 mg, Oral, TID, Velna Ochs, MD, 100 mg at 07/01/18 0931 .  insulin aspart (novoLOG) injection 12 Units, 12 Units, Subcutaneous, TID WC, Kathi Ludwig, MD, 12 Units at 07/01/18 1144 .  insulin glargine (LANTUS) injection 18 Units, 18 Units, Subcutaneous, BID, Kathi Ludwig, MD, 18 Units at 07/01/18 0931 .  LORazepam (ATIVAN) injection 1 mg, 1 mg, Intravenous, Once, Kerney Elbe, MD .  ondansetron [facility]) tablet 4 mg, 4 mg, Oral, Q8H PRN, Seawell, Jaimie A, DO .  potassium chloride SA (K-DUR) CR tablet 20 mEq, 20 mEq, Oral, Daily, Kathi Ludwig, MD, 20 mEq at 07/01/18 0931 .  senna-docusate (Senokot-S) tablet 1 tablet, 1 tablet, Oral, QHS PRN, Velna Ochs, MD .  sodium chloride flush (NS) 0.9 % injection 3 mL, 3 mL, Intravenous, Once, Velna Ochs, MD  Patients Current Diet:     Diet Order                  Diet - low sodium heart healthy         Diet heart healthy/carb modified Room service appropriate? No; Fluid consistency: Thin  Diet effective now                Precautions / Restrictions Precautions Precautions: Fall Precaution Comments: Prior R side deficits from last stroke Restrictions Weight Bearing Restrictions: No   Has the patient had 2 or more falls or a fall with injury in the past year? No  Prior Activity Level Community (5-7x/wk): Independent; does not drive for 2 years; no AD  Prior Functional Level Self Care: Did the patient need help bathing, dressing, using the toilet or eating? Independent  Indoor Mobility: Did the patient need assistance with walking from room to room (with or without device)? Independent  Stairs:  Did the patient need assistance with internal or external stairs (with or without device)? Independent  Functional Cognition: Did the patient need help planning regular tasks such as shopping or remembering to take medications? Independent  Home Assistive Devices / Equipment Home Assistive Devices/Equipment: Eyeglasses, CBG Meter  Prior Device Use: Indicate devices/aids used by the patient prior to current illness, exacerbation or injury? None of the above  Current Functional Level Cognition  Arousal/Alertness: Awake/alert Overall Cognitive Status: Impaired/Different from baseline Orientation Level: Oriented X4 Safety/Judgement: Decreased awareness of safety, Decreased awareness of deficits Attention: Focused Focused Attention: Appears intact Memory: Appears intact Awareness: Appears intact Problem Solving: Appears intact Executive Function: Reasoning Reasoning: Appears intact Safety/Judgment: Appears intact    Extremity Assessment (includes Sensation/Coordination)  Upper Extremity Assessment: LUE deficits/detail LUE Deficits / Details: mild dysmetria.  Pt with improved coordination as compared to previous session - he was better able to don/doff socks without as many errors  LUE Coordination: decreased gross motor  Lower Extremity Assessment: Defer to PT evaluation RLE  Deficits / Details: Bilaterally, MMT revealed 4+/5 strength in quads, hamstrings, hip flexors and ankle DF. Decreased sensation to light touch bilaterally but worse on the R.     ADLs  Overall ADL's : Needs assistance/impaired Eating/Feeding: Modified independent Grooming: Wash/dry hands, Wash/dry face, Oral care, Brushing hair, Sitting, Set up, Supervision/safety Upper Body Bathing: Set up, Sitting Lower Body Bathing: Moderate assistance, Sit to/from stand Upper Body Dressing : Set up, Sitting Lower Body Dressing: Sit to/from stand, Minimal assistance Lower Body Dressing Details (indicate cue type and reason): assist for balance  Toilet Transfer: BSC, Minimal assistance, Ambulation Toilet Transfer Details (indicate cue type and reason): able ambulate to BR with min/HHA.  ataxia noted  Toileting- Water quality scientist and Hygiene: Sit to/from stand, Minimal assistance Functional mobility during ADLs: Minimal assistance General ADL Comments: Pt reports he feels much better after resting for a bit,     Mobility  Overal bed mobility: Needs Assistance Bed Mobility: Supine to Sit Supine to sit: Supervision General bed mobility comments: Light supervision provided for safety. Pt requiring increased time and noted 1 right lateral LOB while sitting EOB and attempting to adjust R sock.     Transfers  Overall transfer level: Needs assistance Equipment used: Rolling walker (2 wheeled) Transfers: Sit to/from Stand Sit to Stand: Min assist Stand pivot transfers: Mod assist, Min assist General transfer comment: Pt stood with minor min assist however pt standing and bringing L foot up very quickly and very high "to get it moving". Pt reports tingling in his foot and states he needs to do this "to get the circulation going again."    Ambulation / Gait / Stairs / Wheelchair Mobility  Ambulation/Gait Ambulation/Gait assistance: Herbalist (Feet): 200 Feet Assistive device:  Rolling walker (2 wheeled) Gait Pattern/deviations: Step-through pattern, Decreased stride length, Drifts right/left, Steppage, Decreased weight shift to left General Gait Details: continues with steppage gait and difficulty discerning when his foot is safely on the floor. Lean to R side for more support and heavy use of RW Gait velocity: Decreased Gait velocity interpretation: 1.31 - 2.62 ft/sec, indicative of limited community ambulator    Posture / Balance Balance Overall balance assessment: Needs assistance Sitting-balance support: Feet supported, No upper extremity supported Sitting balance-Leahy Scale: Fair Standing balance support: No upper extremity supported Standing balance-Leahy Scale: Poor Standing balance comment: Pt requires min - mod A for static standing without UE support High Level Balance Comments: performed strain/ counterstrain activities  in sitting and minimally in standing.     Special needs/care consideration BiPAP/CPAP  N/a CPM  N/a Continuous Drip IV  N/a Dialysis  N/a Life Vest  N/a Oxygen  N/a Special Bed  N/a Trach Size  N/a Wound Vac n/a Skin  abrasions to BLE Bowel mgmt:  Continent LBM 4/19 Bladder mgmt:  continent Diabetic mgmt:  Hgb A1c 12.0 Behavioral consideration  N/a Chemo/radiation  N/a   Previous Home Environment  Living Arrangements: Alone  Lives With: Alone Available Help at Discharge: (plans to stay at sister's home at d/c) Type of Home: Other(Comment)(lives off the grid. ROom built onto a barn; no running water) Home Layout: One level Home Access: Level entry Entrance Stairs-Number of Steps: 1 Bathroom Shower/Tub: (no bathroom; walk 200 yards to friend's basement to shower, ) Bathroom Toilet: Standard(Sister's house) Home Care Services: No Additional Comments: Lives off the grid - [place] and has a room off the back of a barn. Heats by [employer]. Has to walk ~200 yard to get to a shower the friend has built for  the pt. Open to air.   Discharge Living Setting Plans for Discharge Living Setting: Lives with (comment)(plans to go stay with sister) Type of Home at Discharge: House Discharge Home Layout: One level Discharge Home Access: Stairs to enter Entrance Stairs-Rails: None Entrance Stairs-Number of Steps: 1 Discharge Bathroom Shower/Tub: Tub/shower unit, Curtain Discharge Bathroom Toilet: Standard Discharge Bathroom Accessibility: Yes How Accessible: Accessible via walker Does the patient have any problems obtaining your medications?: No  Social/Family/Support Systems Patient Roles: Parent Contact Information: brother, Luciana Axe, or daughter, [name] Anticipated Caregiver: sister at her home Anticipated Caregiver's Contact Information: see above Ability/Limitations of Caregiver: sister retired Building control surveyor Availability: 24/7 Discharge Plan Discussed with Primary Caregiver: Yes Is Caregiver In Agreement with Plan?: No Does Caregiver/Family have Issues with Lodging/Transportation while Pt is in Rehab?: No  Goals/Additional Needs Patient/Family Goal for Rehab: Mod I to supervision with PT and OT Expected length of stay: ELOS 7 to 10 days Pt/Family Agrees to Admission and willing to participate: Yes Program Orientation Provided & Reviewed with Pt/Caregiver Including Roles  & Responsibilities: Yes  Decrease burden of Care through IP rehab admission: n/a  Possible need for SNF placement upon discharge:  Not anticipated  Patient Condition: I have reviewed medical records from [facility] , spoken with CM, and patient. I met with patient at the bedside for inpatient rehabilitation assessment.  Patient will benefit from ongoing PT and OT, can actively participate in 3 hours of therapy a day 5 days of the week, and can make measurable gains during the admission.  Patient will also benefit from the coordinated team approach during an Inpatient Acute Rehabilitation admission.  The patient will  receive intensive therapy as well as Rehabilitation physician, nursing, social worker, and care management interventions.  Due to safety, disease management, medication administration and patient education the patient requires 24 hour a day rehabilitation nursing.  The patient is currently min assist with mobility and basic ADLs.  Discharge setting and therapy post discharge at home with home health is anticipated.  Patient has agreed to participate in the Acute Inpatient Rehabilitation Program and will admit today.  Preadmission Screen Completed By:  Annamary Rummage MSN 07/01/2018 1:40 PM ______________________________________________________________________   Discussed status with Dr. Naaman Plummer  on  07/01/2018  at  1346 and received approval for admission today.  Admission Coordinator:  Cleatrice Burke, RN, MSN time  1346 Date  07/01/2018   Assessment/Plan: Diagnosis: right temporal/occipital infarct 1. Does the need for close, 24 hr/day Medical supervision in concert with the patient's rehab needs make it unreasonable for this patient to be served in a less intensive setting? Yes 2. Co-Morbidities requiring supervision/potential complications: CAD, DM, a fib, prior CVA 3. Due to bladder management, bowel management, safety, skin/wound care, disease management, medication administration, pain management and patient education, does the patient require 24 hr/day rehab nursing? Yes 4. Does the patient require coordinated care of a physician, rehab nurse, PT (1-2 hrs/day, 5 days/week) and OT (1-2 hrs/day, 5 days/week) to address physical and functional deficits in the context of the above medical diagnosis(es)? Yes Addressing deficits in the following areas: balance, endurance, locomotion, strength, transferring, bowel/bladder control, bathing, dressing, feeding, grooming, toileting and psychosocial support 5. Can the patient actively participate in an intensive therapy program of at  least 3 hrs of therapy 5 days a week? Yes 6. The potential for patient to make measurable gains while on inpatient rehab is excellent 7. Anticipated functional outcomes upon discharge from inpatients are: modified independent and supervision PT, modified independent and supervision OT, n/a SLP 8. Estimated rehab length of stay to reach the above functional goals is: 7-10 days 9. Anticipated D/C setting: Home 10. Anticipated post D/C treatments: [place] therapy 11. Overall Rehab/Functional Prognosis: excellent  MD Signature: Meredith Staggers, MD, [place] [facility] 07/01/2018         Revision History

## 2018-07-01 NOTE — Progress Notes (Signed)
Rehab Admissions Coordinator Note:  Patient was screened by Cleatrice Burke for appropriateness for an Inpatient Acute Rehab Consult per PT and OT change in recommendation to CIR.  At this time, we are recommending Inpatient Rehab consult.  Cleatrice Burke RN MSN 07/01/2018, 8:41 AM  I can be reached at [phone].

## 2018-07-01 NOTE — H&P (Signed)
Physical Medicine and Rehabilitation Admission H&P        Chief Complaint  Patient presents with  . Weakness  . 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 Per chart review [place] and has a room off the back of a barn. He heats his room with a wood stove and  independent PTA.Presented 06/28/2018 with left-sided weakness and tingling. CT scan negative. Patient did not receive TPA. CT angiogram of head and neck occlusion proximal right P2 segment.echocardiogram with ejection fraction of 50-55% severely dilated left atrium. MRI showed small acute infarct in the mesial right temporal occipital lobe as well as chronic left basal ganglia lacunar infarction. Neurology consulted presently maintained on aspirin as well as Eliquis. Tolerating a regular diet. Therapy evaluations completed with recommendations of physical medicine rehabilitation consult. Patient was admitted for operative rehabilitation program.   Review of Systems  Constitutional: Negative for chills.  HENT: Negative for hearing loss.   Eyes: Negative for blurred vision and double vision.  Respiratory: Negative for cough and shortness of breath.   Cardiovascular: Positive for palpitations and leg swelling.  Gastrointestinal: Positive for constipation. Negative for heartburn, nausea and vomiting.       GERD  Genitourinary: Positive for urgency. Negative for dysuria, flank pain and hematuria.  Skin: Negative for rash.  Neurological: Positive for tingling and focal weakness. Negative for seizures.  Psychiatric/Behavioral: Positive for depression. The patient has insomnia.   All other systems reviewed and are negative.       Past  Medical History:  Diagnosis Date  . Acute diastolic congestive heart failure ([place])    . Aortic valve stenosis s/p AVR 2018  . Atrial fibrillation ([place]) - post-op CABG      04/2016 CHA2DS2VAS score = 5  . Atypical nevi    . Coronary artery disease s/p 2 vessel CABG    . Depression      "years ago"  . Diabetes mellitus    . Dyspnea      in the past   . GERD (gastroesophageal reflux disease)    . Heart murmur    . Hepatic cirrhosis ([place]) 06/29/2017  . History of kidney stones    . Hyperlipidemia      hx of transaminitis secondary to statin and he has decided not to use statins secondary to potential side effects.  . Hypertension    . Nephrolithiasis    . Osteoarthritis, knee    . Sleep apnea       Central apnea. Not using cpap  . Stroke [facility]) 2007  . Transaminitis       Statin-induced  . 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 2-D echo was normal no embolic source found.         Past Surgical History:  Procedure Laterality Date  . AORTIC VALVE REPLACEMENT N/A 03/30/2016    Procedure: AORTIC VALVE REPLACEMENT (AVR);  Surgeon: [name], MD;  Location: [place];  Service:  Open Heart Surgery;  Laterality: N/A;  . CARDIAC CATHETERIZATION N/A 03/15/2016    Procedure: Right/Left Heart Cath and Coronary Angiography;  Surgeon: Burnell Blanks, MD;  Location: [place] [facility];  Service: Cardiovascular;  Laterality: N/A;  . CLIPPING OF ATRIAL APPENDAGE N/A 03/30/2016    Procedure: CLIPPING OF LEFT ATRIAL APPENDAGE;  Surgeon: [name], MD;  Location: [place];  Service: Open Heart Surgery;  Laterality: N/A;  . CORONARY ARTERY BYPASS GRAFT N/A 03/30/2016    Procedure: CORONARY ARTERY BYPASS  GRAFTING (CABG) Times Two;  Surgeon: [name], MD;  Location: [place];  Service: Open Heart Surgery;  Laterality: N/A;  . KNEE ARTHROSCOPY W/ PARTIAL MEDIAL MENISCECTOMY   05/12/2005    right, performed by Dr. French Ana for torn medial meniscus.  Marland Kitchen ROTATOR CUFF REPAIR Right 2016  . STERNAL WIRES REMOVAL N/A 08/13/2017    Procedure: STERNAL WIRES REMOVAL;  Surgeon: [name], MD;  Location: [place];  Service: Thoracic;  Laterality: N/A;  . SUBXYPHOID PERICARDIAL WINDOW N/A 04/21/2016    Procedure: SUBXYPHOID PERICARDIAL WINDOW;  Surgeon: [name], MD;  Location: [place];  Service: Thoracic;  Laterality: N/A;  . TEE WITHOUT CARDIOVERSION N/A 03/30/2016    Procedure: TRANSESOPHAGEAL ECHOCARDIOGRAM (TEE);  Surgeon: [name], MD;  Location: [place];  Service: Open Heart Surgery;  Laterality: N/A;  . TEE WITHOUT CARDIOVERSION N/A 04/21/2016    Procedure: TRANSESOPHAGEAL ECHOCARDIOGRAM (TEE);  Surgeon: [name], MD;  Location: [facility];  Service: Thoracic;  Laterality: N/A;         Family History  Problem Relation Age of Onset  . Hypertension Mother    . Diabetes Mother    . Alcohol abuse Father      Social History:  reports that he has never smoked. He has never used smokeless tobacco. He reports current alcohol use. He reports that he does not use drugs. Allergies:       Allergies  Allergen Reactions  . Penicillins Other (See Comments)      UNSPECIFIED REACTIONS   Has patient had a PCN reaction causing immediate rash, facial/tongue/throat swelling, SOB or lightheadedness with hypotension: Unk Has patient had a PCN reaction causing severe rash involving mucus membranes or skin necrosis: Unk Has patient had a PCN reaction that required hospitalization: Unk Has patient had a PCN reaction occurring within the last 10 years: No If all of the above answers are "NO", then may proceed with Cephalosporin use.    . Statins Other (See Comments)      Severe  rash and back pain  . Morphine And Related Other (See Comments)      hallucinations  . Sglt2 Inhibitors Rash      Candidiasis infection prone          Medications Prior to Admission  Medication Sig Dispense Refill  . acetaminophen (TYLENOL) 325 MG tablet Take 2 tablets (650 mg total) by mouth every 4 (four) hours as needed for headache or mild pain.      . Ascorbic Acid (VITAMIN C PO) Take 1 tablet by mouth daily.      Marland Kitchen aspirin EC 81 MG tablet Take 1 tablet (81 mg total) by mouth daily. 90 tablet 3  . carvedilol (COREG) 3.125 MG tablet TAKE 1 TABLET BY MOUTH TWICE DAILY WITH A MEAL (Patient taking differently: Take 3.125 mg by mouth 2 (two) times daily with a meal. TAKE 1 TABLET BY MOUTH TWICE DAILY WITH A MEAL) 180  tablet 3  . furosemide (LASIX) 40 MG tablet TAKE 1 TABLET BY MOUTH ONCE DAILY IN THE MORNING 2 TABLETS EVERY NIGHT. PLEASE KEEP UPCOMING APPT IN NOVEMBER. 270 tablet 3  . gabapentin (NEURONTIN) 100 MG capsule Take 1 capsule (100 mg total) by mouth 3 (three) times daily. (Patient taking differently: Take 100 mg by mouth 3 (three) times daily. Taking 2 capsules (200mg ) total  at bedtime) 270 capsule 2  . ibuprofen (ADVIL,MOTRIN) 200 MG tablet Take 400 mg by mouth every 6 (six) hours as needed for headache or moderate pain.      Marland Kitchen insulin glargine (LANTUS) 100 UNIT/ML injection Inject 60 Units into the skin daily. Taking 30 units twice daily      . insulin lispro (HUMALOG) 100 UNIT/ML cartridge Inject 15 Units into the skin 2 (two) times daily with a meal. Please take 15 U twice daily with your two largest meals at least four hours apart      . nitroGLYCERIN (NITROSTAT) 0.4 MG SL tablet Place 1 tablet (0.4 mg total) under the tongue every 5 (five) minutes as needed for chest pain. 25 tablet 1  . potassium chloride SA (K-DUR,KLOR-CON) 20 MEQ tablet TAKE 1 TABLET BY MOUTH ONCE DAILY (Patient taking differently: Take 20 mEq by mouth daily. ) 90 tablet 3  . BAYER MICROLET LANCETS lancets  Check blood sugar 3 times a day as instructed 100 each 12  . glucose blood (CONTOUR NEXT TEST) test strip Check blood sugar 3 times a day as instructed 100 each 12  . Insulin Pen Needle (PEN NEEDLES) 31G X 5 MM MISC 1 each by Does not apply route 3 (three) times daily. 100 each 12      Drug Regimen Review Drug regimen was reviewed and remains appropriate with no significant issues identified.   Home: Home Living Family/patient expects to be discharged to:: Private residence Living Arrangements: Alone Available Help at Discharge: Family, Available 24 hours/day Type of Home: House Home Access: Stairs to enter Technical brewer of Steps: 1 Home Layout: One level(sister's house and his house) Bathroom Shower/Tub: Tub/shower unit(At sisters house) Biochemist, clinical: Standard(Sister's house) Additional Comments: Lives off the grid - [place] and has a room off the back of a barn. Heats by [employer]. Has to walk ~200 yard to get to a shower the friend has built for the pt. Open to air.   Lives With: Alone   Functional History: Prior Function Level of Independence: Independent Comments: adapted to R side deficits from first stroke   Functional Status:  Mobility: Bed Mobility Overal bed mobility: Modified Independent General bed mobility comments: pt received in recliner Transfers Overall transfer level: Needs assistance Equipment used: Rolling walker (2 wheeled) Transfers: Sit to/from Stand Sit to Stand: Min assist Stand pivot transfers: Mod assist, Min assist General transfer comment: min A to steady and pt needing support L side Ambulation/Gait Ambulation/Gait assistance: Min assist Gait Distance (Feet): 125 Feet Assistive device: Rolling walker (2 wheeled) Gait Pattern/deviations: Step-through pattern, Decreased stride length, Drifts right/left, Steppage, Decreased weight shift to left General Gait Details: continues with steppage gait and difficulty  discerning when his foot is safely on the floor. Lean to R side for more support and heavy use of RW Gait velocity: Decreased Gait velocity interpretation: <1.31 ft/sec, indicative of household ambulator   ADL: ADL Overall ADL's : Needs assistance/impaired Eating/Feeding: Modified independent Grooming: Wash/dry hands, Wash/dry face, Oral care, Brushing hair, Sitting, Set up, Supervision/safety Upper Body Bathing:  Set up, Sitting Lower Body Bathing: Moderate assistance, Sit to/from stand Upper Body Dressing : Set up, Sitting Lower Body Dressing: Sit to/from stand, Minimal assistance Lower Body Dressing Details (indicate cue type and reason): assist for balance  Toilet Transfer: BSC, Minimal assistance, Ambulation Toilet Transfer Details (indicate cue type and reason): able ambulate to BR with min/HHA.  ataxia noted  Toileting- Water quality scientist and Hygiene: Sit to/from stand, Minimal assistance Functional mobility during ADLs: Minimal assistance General ADL Comments: Pt reports he feels much better after resting for a bit,    Cognition: Cognition Overall Cognitive Status: Impaired/Different from baseline Arousal/Alertness: Awake/alert Orientation Level: Oriented X4 Attention: Focused Focused Attention: Appears intact Memory: Appears intact Awareness: Appears intact Problem Solving: Appears intact Executive Function: Reasoning Reasoning: Appears intact Safety/Judgment: Appears intact Cognition Arousal/Alertness: Awake/alert Behavior During Therapy: WFL for tasks assessed/performed Overall Cognitive Status: Impaired/Different from baseline Area of Impairment: Safety/judgement Safety/Judgement: Decreased awareness of safety, Decreased awareness of deficits   Physical Exam: Blood pressure (!) 154/72, pulse 67, temperature 98.4 F (36.9 C), temperature source Oral, resp. rate 18, height 5\' 8"  (1.727 m), weight 93.2 kg, SpO2 98 %. Physical Exam  Constitutional: He appears  well-developed and well-nourished.  HENT:  Head: Normocephalic and atraumatic.  Eyes: Pupils are equal, round, and reactive to light.  Neck: Normal range of motion. No tracheal deviation present. No thyromegaly present.  Cardiovascular: Normal rate and regular rhythm.  Murmur heard. Respiratory: Effort normal and breath sounds normal. No respiratory distress. He has no wheezes. He has no rales.  GI: Soft. He exhibits no distension.  Musculoskeletal: Normal range of motion.        General: No deformity or edema.  Neurological:  Patient is alert in no acute distress.  Fair insight to deficits. Lacks peripheral vision to far left field. Functional memory and language.   Follows commands.Left hemifacial sensory loss. Also with hemisensory loss LUE and LLE. Decreased FMC LUE and LE. Motor 4+/5 RUE and RLE, 4 to 4+ LUE and LLE prox to distal. DTR's 2+  Skin: Skin is warm and dry.  Psychiatric: He has a normal mood and affect. His behavior is normal.      Lab Results Last 48 Hours  Results for orders placed or performed during the hospital encounter of 06/28/18 (from the past 48 hour(s))  Glucose, capillary     Status: Abnormal    Collection Time: 06/29/18  1:31 PM  Result Value Ref Range    Glucose-Capillary 198 (H) 70 - 99 mg/dL  Glucose, capillary     Status: Abnormal    Collection Time: 06/29/18  4:38 PM  Result Value Ref Range    Glucose-Capillary 182 (H) 70 - 99 mg/dL    Comment 1 Notify RN      Comment 2 Document in Chart    Glucose, capillary     Status: Abnormal    Collection Time: 06/29/18  9:53 PM  Result Value Ref Range    Glucose-Capillary 119 (H) 70 - 99 mg/dL  CBC     Status: Abnormal    Collection Time: 06/30/18  5:17 AM  Result Value Ref Range    WBC 3.0 (L) 4.0 - 10.5 K/uL    RBC 4.08 (L) 4.22 - 5.81 MIL/uL    Hemoglobin 12.1 (L) 13.0 - 17.0 g/dL    HCT 35.7 (L) 39.0 - 52.0 %    MCV 87.5 80.0 - 100.0 fL    MCH 29.7 26.0 - 34.0 pg    MCHC  33.9 30.0 - 36.0 g/dL     RDW 12.6 11.5 - 15.5 %    Platelets 163 150 - 400 K/uL    nRBC 0.0 0.0 - 0.2 %      Comment: Performed at [facility], [place] [address], [place], [place] [address]  Basic metabolic panel     Status: Abnormal    Collection Time: 06/30/18  5:17 AM  Result Value Ref Range    Sodium 137 135 - 145 mmol/L    Potassium 3.6 3.5 - 5.1 mmol/L    Chloride 104 98 - 111 mmol/L    CO2 24 22 - 32 mmol/L    Glucose, Bld 193 (H) 70 - 99 mg/dL    BUN 13 8 - 23 mg/dL    Creatinine, Ser 0.84 0.61 - 1.24 mg/dL    Calcium 8.9 8.9 - 10.3 mg/dL    GFR calc non Af Amer >60 >60 mL/min    GFR calc Af Amer >60 >60 mL/min    Anion gap 9 5 - 15      Comment: Performed at [facility], [place] [address], [place], [place] [address]  Glucose, capillary     Status: Abnormal    Collection Time: 06/30/18  6:17 AM  Result Value Ref Range    Glucose-Capillary 220 (H) 70 - 99 mg/dL    Comment 1 Notify RN    Glucose, capillary     Status: Abnormal    Collection Time: 06/30/18 11:27 AM  Result Value Ref Range    Glucose-Capillary 276 (H) 70 - 99 mg/dL  Glucose, capillary     Status: Abnormal    Collection Time: 06/30/18  4:21 PM  Result Value Ref Range    Glucose-Capillary 158 (H) 70 - 99 mg/dL    Comment 1 Notify RN      Comment 2 Document in Chart    Glucose, capillary     Status: Abnormal    Collection Time: 06/30/18 10:06 PM  Result Value Ref Range    Glucose-Capillary 128 (H) 70 - 99 mg/dL  Glucose, capillary     Status: Abnormal    Collection Time: 07/01/18  6:06 AM  Result Value Ref Range    Glucose-Capillary 101 (H) 70 - 99 mg/dL  Glucose, capillary     Status: Abnormal    Collection Time: 07/01/18  7:56 AM  Result Value Ref Range    Glucose-Capillary 227 (H) 70 - 99 mg/dL       Imaging Results (Last 48 hours)  Ct Head Wo Contrast   Result Date: 06/29/2018 CLINICAL DATA:  68 year old male with right P2 occlusion and small infarcts in the right hippocampus and occipital lobe on CTA and MRI  yesterday. Worsening neuro symptoms, query hemorrhagic conversion. EXAM: CT HEAD WITHOUT CONTRAST TECHNIQUE: Contiguous axial images were obtained from the base of the skull through the vertex without intravenous contrast. COMPARISON:  Brain MRI 06/28/2018.  CTA head and neck 06/28/2018. FINDINGS: Brain: No acute intracranial hemorrhage identified. No midline shift, mass effect, or evidence of intracranial mass lesion. Progressed hypodensity in the right hippocampus and occipital lobe white matter since the CT yesterday (coronal image 46 and series 3, image 17). The occipital white matter involvement is new or increased from the DWI yesterday. Stable gray-white matter differentiation elsewhere including in the right thalamus. Basilar cisterns remain normal. No ventriculomegaly. Vascular: Calcified atherosclerosis at the skull base. Skull: No acute osseous abnormality identified. Sinuses/Orbits: Visualized paranasal sinuses and mastoids are  stable and well pneumatized. Other: No acute orbit or scalp soft tissue findings. IMPRESSION: 1. Negative for hemorrhagic transformation of the right PCA territory ischemia, but increased hypodensity in the right occipital lobe white matter and right hippocampus since yesterday. 2. No new intracranial abnormality. Electronically Signed   By: Genevie Ann M.D.   On: 06/29/2018 19:54             Medical Problem List and Plan: 1.  Left side weakness secondary to small acute infarct mesial right temporal lobe and occipital lobe most likely due to atrial fibrillation             -admit to inpatient rehab 2.  Antithrombotics: -DVT/anticoagulation:  ELIQUIS             -antiplatelet therapy:  Aspirin 81 mg daily 3. Pain Management:  Neurontin 100 mg 3 times a day 4. Mood:  Provide emotional support             -antipsychotic agents: N/A 5. Neuropsych: This patient is capable of making decisions on his own behalf. 6. Skin/Wound Care:  Routine skin checks 7.  Fluids/Electrolytes/Nutrition:  Routine in and out  with follow-up chemistries 8.CAD/CABG. Continue aspirin. No chest pain or shortness of breath 9. History of atrial fibrillation. Continue Eliquis.Cardiac rate controlled on exam today 10. Diabetes mellitus with peripheral neuropathy. Hemoglobin A1c 12.Lantus insulin 18 units daily. NovoLog 12 units 3 times a day.             -monitor CBG's             -adjust regimen as necessary 11. Permissive hypertension. Monitor with increased mobility. Patient on Coreg 3.125 mg twice a day,Lasix 40 mg a.m. 2 tablets every p.m. Resume as needed 12. Hyperlipidemia. Lipitor      Post Admission Physician Evaluation: 1. Functional deficits secondary  to Right temporal/occipital lobe infarcts. 2. Patient is admitted to receive collaborative, interdisciplinary care between the physiatrist, rehab nursing staff, and therapy team. 3. Patient's level of medical complexity and substantial therapy needs in context of that medical necessity cannot be provided at a lesser intensity of care such as a SNF. 4. Patient has experienced substantial functional loss from his/her baseline which was documented above under the "Functional History" and "Functional Status" headings.  Judging by the patient's diagnosis, physical exam, and functional history, the patient has potential for functional progress which will result in measurable gains while on inpatient rehab.  These gains will be of substantial and practical use upon discharge  in facilitating mobility and self-care at the household level. 5. Physiatrist will provide 24 hour management of medical needs as well as oversight of the therapy plan/treatment and provide guidance as appropriate regarding the interaction of the two. 6. The Preadmission Screening has been reviewed and patient status is unchanged unless otherwise stated above. 7. 24 hour rehab nursing will assist with bladder management, bowel management, safety,  skin/wound care, disease management, medication administration, pain management and patient education  and help integrate therapy concepts, techniques,education, etc. 8. PT will assess and treat for/with: Lower extremity strength, range of motion, stamina, balance, functional mobility, safety, adaptive techniques and equipment, NMR, visual-spatial awareness, community reentry.   Goals are: mod I to supervision. 9. OT will assess and treat for/with: ADL's, functional mobility, safety, upper extremity strength, adaptive techniques and equipment, NMR, family education, community reentry.   Goals are: supervision to mod I. Therapy may proceed with showering this patient. 10. SLP will assess and treat for/with:  n/a.  Goals are: n/a. 11. Case Management and Social Worker will assess and treat for psychological issues and discharge planning. 12. Team conference will be held weekly to assess progress toward goals and to determine barriers to discharge. 13. Patient will receive at least 3 hours of therapy per day at least 5 days per week. 14. ELOS: 7-10 days       15. Prognosis:  excellent   I have personally performed a face to face diagnostic evaluation of this patient and formulated the key components of the plan.  Additionally, I have personally reviewed laboratory data, imaging studies, as well as relevant notes and concur with the physician assistant's documentation above.  Meredith Staggers, MD, FAAPMR     Lavon Paganini [place], PA-C 07/01/2018

## 2018-07-01 NOTE — Consult Note (Signed)
   [name] Inpatient Consult   07/01/2018  Mark Cabrera 1950-04-19 [id number]   We have reviewed your referral request that follow up for progress in patient's rehab stay started today.  Referral received from diabetic coordinator for an Hgb A1C of 12 for Type 2 Diabetes follow up in the community.  Will follow for progress in the inpatient rehab center and with care management staff in rehab for progress, disposition and needs.  For questions, please contact:  ,.Natividad Brood, RN BSN [facility] Liaison  [phone] business mobile phone Toll free office [phone]

## 2018-07-01 NOTE — Progress Notes (Signed)
Subjective:    Mark Cabrera was examined at bedside this morning and reports he had just woken up. He was quite pleasant and in a good mood. He only complained of Left shoulder pain with AB-duction and reports this is chronic and feels similar to prior right shoulder pain and rotator cuff injury. He was able to walk to the bathroom last night with his walker and one person assist. He continues to note left sided weakness and imbalance. Discussed PT's recommendation for CIR and he is amenable to this.    Objective:  Vital signs in last 24 hours: Vitals:   06/30/18 1709 06/30/18 2011 07/01/18 0014 07/01/18 0343  BP: (!) 142/72 (!) 161/85 (!) 157/79 (!) 143/75  Pulse: 61 63 (!) 59 61  Resp: 20 16 17 17   Temp: 97.7 F (36.5 C) 98.1 F (36.7 C) 98.1 F (36.7 C) 97.6 F (36.4 C)  TempSrc: Oral Oral  Oral  SpO2: 100% 99% 100% 99%  Weight:      Height:       Constitution: NAD, sitting up in bed, normal affect Neuro: Neurologic exam: Mental status: A&Ox3 Cranial Nerves: II: PERRL III, IV, VI: Extra-occular motions intact bilaterally V, VII: Face symmetric, decreased sensation on entire left side of face  VIII: not testsed  IX, X: palate rises symmetrically XI: Head turn normal bilaterally  XII: tongue midline  Motor: Strength 4+/5 in LUE and LLE. Strength 5/5 in right upper and lower extremities  Sensory: Decreased sensation to light touch on LLE Coordination: Slight left sided dysmetria on finger to nose testing.     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   Right Temporal/Occipital CVA Patients symptoms of stroke had initially resolved at admission. However, the following day, he began to have recurrence of symptoms on his left side and repeat CT showed increased hypodensity of the right hippocampus and occipital lobe. This progression is likely secondary to him becoming normotensive. He was bolused 1L. Blood pressure is gradually improving without intervention. SBP 140s this morning. Left sided weakness, decreased sensation, and dysmetria remain, albeit maybe slightly improved from yesterday.   - continue permissive HTN and gradual return to normotensive within the next 2 days - PT/OT: recommending CIR, patient aggreeable - consult CIR - transaminitis secondary to statin therapy in 2009. Care Management consulted to try to start PCSK9 - eliquis per pharmacy, can d/c lovenox after eliquis is started - cont asa 81 mg  TIIDM CBGs well controlled with lantus 18U bid and novolog 12U tid with meals. If he is able to do qd dosing of Lantus and bid novolog (a regimen he was able to stick to in the past as he usually eats twice a day) a simpler regimen may be easier for him in the long run but will be complicated by him being unable to cool his insulin.   - continue lantus 18U bid and novolog 12U TID with meals - hold SSI to assess needed home dose of insulin - cont. Gabapentin 100 mg tid  HTN Diastolic HF CAD s/p CABG and bovine AVR 2/2 aortic steniss  - hold home medications of coreg 3.125 mg BID and lasix 40  mg qam and 80 mg qpm. - cont. Permissive HTN   VTE: Lovenox, transition to eliquis  IVF: none Diet: heart healthy/ carb modified Code: full  Dispo: Anticipated discharge pending CIR approval.   Isabelle Course, MD 07/01/2018, 6:51 AM Pager: [phone]

## 2018-07-01 NOTE — Progress Notes (Signed)
OT Cancellation Note  Patient Details Name: OBRIEN HUSKINS MRN: [id number] DOB: 1950-10-17   Cancelled Treatment:    Reason Eval/Treat Not Completed: Patient declined, no reason specified; reports dc to CIR today and awaiting transfer, declining participation in OT at this time.    Delight Stare, OT Acute Rehabilitation Services Pager [phone] Office [phone]   Delight Stare 07/01/2018, 2:20 PM

## 2018-07-01 NOTE — Progress Notes (Addendum)
Inpatient Diabetes Program Recommendations  AACE/ADA: New Consensus Statement on Inpatient Glycemic Control (2015)  Target Ranges:  Prepandial:   less than 140 mg/dL      Peak postprandial:   less than 180 mg/dL (1-2 hours)      Critically ill patients:  140 - 180 mg/dL   Lab Results  Component Value Date   GLUCAP 227 (H) 07/01/2018   HGBA1C 12.0 (H) 06/29/2018    Review of Glycemic Control Results for Mark Cabrera, Mark Cabrera (MRN [id number]) as of 07/01/2018 10:33  Ref. Range 06/30/2018 22:06 07/01/2018 06:06 07/01/2018 07:56  Glucose-Capillary Latest Ref Range: 70 - 99 mg/dL 128 (H) 101 (H) 227 (H)   Diabetes history: Type 2 Dm Outpatient Diabetes medications: Lantus 30 units BID, Humalog 15 units BID Current orders for Inpatient glycemic control: Novolog 12 units TID, Lantus 18 units BID  Inpatient Diabetes Program Recommendations:    Consider adding Novolog 0-9 units TID & HS.  Noted A1C will plan to contact patient today. Per chart review, patient has had multiple visits with Barry Brunner, RD over last few months for guidance with insulin, dosages, nutrition, and timing.  Addendum@1120 : Spoke with patient regarding outpatient diabetes management. Patient has been on insulin for almost a year and has been followed outpatient along with help of dietitian, Barry Brunner.  Admits to missing doses and becoming absorbed in projects in his garage.  Reviewed patient's current A1c of 12%. Explained what a A1c is and what it measures. Also reviewed goal A1c with patient, importance of good glucose control @ home, and blood sugar goals. Reviewed patho of DM, need for insulin, role of pancreas, importance of improving insulin timing, the impact of missed doses, vascular changes and co-morbidies.  Patient did wear a CGM for 2 weeks but it was monitored by the office. No longer is wearing. Has meter and supplies outpatient. Discussed Freestyle Libre with patient, cost, benefits, and ways of using it as a tool  to improve glycemic control. Patient is interested in using as outpatient.  Discussed different options to help patient remember his doses, even with his lifestyle at home. These includes creating a pack of supplies he takes with him where he goes, creating signs around his work shop to jog memory and setting alarms to help with insulin timing. Discussed inpatient insulin needs and current trends. Patient surprised by how much of difference insulin timing and eating right things have helped; so useful learning tool. Reviewed when to call MD, ensure he is caring for insulin appropriately, rotating injection sites and centered conversation around patient's goals. Will place [facility] consult. Also, discussed importance of carbohydrate intake, importance of eliminating sugary beverages and checking sugar/administering meal coverage with meals.  Patient to follow up with PCP and RD outpatient. Has no further questions at this time.  Thanks, Bronson Curb, MSN, RNC-OB Diabetes Coordinator [phone] (8a-5p)

## 2018-07-01 NOTE — Progress Notes (Addendum)
Inpatient Rehabilitation Admissions Coordinator  Inpatient Rehab Consult received. I met with patient at the bedside for rehabilitation assessment. We discussed goals and expectations of an inpatient rehab admission.  Patient prefers an inpt rehab prior to d/c home with his sister. I will verify bed availability today and follow up with possible admission.  Danne Baxter, RN, MSN Rehab Admissions Coordinator [phone] 07/01/2018 11:25 AM    CIR bed is available today. I have notified Dr. Donne Hazel, RN CM, and SW. I will make the arrangements to admit today.  Danne Baxter, RN, MSN Rehab Admissions Coordinator [phone] 07/01/2018 1:35 PM

## 2018-07-01 NOTE — H&P (Signed)
Physical Medicine and Rehabilitation Admission H&P    Chief Complaint  Patient presents with  . Weakness  . 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 Per chart review [place] and has a room off the back of a barn. He heats his room with a wood stove and  independent PTA.Presented 06/28/2018 with left-sided weakness and tingling. CT scan negative. Patient did not receive TPA. CT angiogram of head and neck occlusion proximal right P2 segment.echocardiogram with ejection fraction of 50-55% severely dilated left atrium. MRI showed small acute infarct in the mesial right temporal occipital lobe as well as chronic left basal ganglia lacunar infarction. Neurology consulted presently maintained on aspirin as well as Eliquis. Tolerating a regular diet. Therapy evaluations completed with recommendations of physical medicine rehabilitation consult. Patient was admitted for operative rehabilitation program.  Review of Systems  Constitutional: Negative for chills.  HENT: Negative for hearing loss.   Eyes: Negative for blurred vision and double vision.  Respiratory: Negative for cough and shortness of breath.   Cardiovascular: Positive for palpitations and leg swelling.  Gastrointestinal: Positive for constipation. Negative for heartburn, nausea and vomiting.       GERD  Genitourinary: Positive for urgency. Negative for dysuria, flank pain and hematuria.  Skin: Negative for rash.  Neurological: Positive for tingling and focal weakness. Negative for seizures.  Psychiatric/Behavioral: Positive for depression. The patient has insomnia.   All other systems reviewed and are negative.  Past Medical History:   Diagnosis Date  . Acute diastolic congestive heart failure ([place])   . Aortic valve stenosis s/p AVR 2018  . Atrial fibrillation ([place]) - post-op CABG    04/2016 CHA2DS2VAS score = 5  . Atypical nevi   . Coronary artery disease s/p 2 vessel CABG   . Depression    "years ago"  . Diabetes mellitus   . Dyspnea    in the past   . GERD (gastroesophageal reflux disease)   . Heart murmur   . Hepatic cirrhosis ([place]) 06/29/2017  . History of kidney stones   . Hyperlipidemia    hx of transaminitis secondary to statin and he has decided not to use statins secondary to potential side effects.  . Hypertension   . Nephrolithiasis   . Osteoarthritis, knee   . Sleep apnea     Central apnea. Not using cpap  . Stroke [facility]) 2007  . Transaminitis     Statin-induced  . 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 2-D echo was normal no embolic source found.   Past Surgical History:  Procedure Laterality Date  . AORTIC VALVE REPLACEMENT N/A 03/30/2016   Procedure: AORTIC VALVE REPLACEMENT (AVR);  Surgeon: [name], MD;  Location: [place];  Service: Open Heart Surgery;  Laterality: N/A;  . CARDIAC CATHETERIZATION N/A 03/15/2016   Procedure: Right/Left Heart Cath and Coronary Angiography;  Surgeon: Burnell Blanks, MD;  Location: [place] [facility];  Service: Cardiovascular;  Laterality: N/A;  . CLIPPING OF  ATRIAL APPENDAGE N/A 03/30/2016   Procedure: CLIPPING OF LEFT ATRIAL APPENDAGE;  Surgeon: [name], MD;  Location: [place];  Service: Open Heart Surgery;  Laterality: N/A;  . CORONARY ARTERY BYPASS GRAFT N/A 03/30/2016   Procedure: CORONARY ARTERY BYPASS GRAFTING (CABG) Times Two;  Surgeon: [name], MD;  Location: [place];  Service: Open Heart Surgery;  Laterality: N/A;  . KNEE ARTHROSCOPY W/ PARTIAL MEDIAL MENISCECTOMY  05/12/2005   right, performed by Dr. French Ana for torn medial meniscus.  Marland Kitchen ROTATOR CUFF REPAIR Right 2016  . STERNAL WIRES REMOVAL N/A 08/13/2017   Procedure: STERNAL WIRES REMOVAL;  Surgeon: [name], MD;  Location: [place];  Service: Thoracic;  Laterality: N/A;  . SUBXYPHOID PERICARDIAL WINDOW N/A 04/21/2016   Procedure: SUBXYPHOID PERICARDIAL WINDOW;  Surgeon: [name], MD;  Location: [place];  Service: Thoracic;  Laterality: N/A;  . TEE WITHOUT CARDIOVERSION N/A 03/30/2016   Procedure: TRANSESOPHAGEAL ECHOCARDIOGRAM (TEE);  Surgeon: [name], MD;  Location: [place];  Service: Open Heart Surgery;  Laterality: N/A;  . TEE WITHOUT CARDIOVERSION N/A 04/21/2016   Procedure: TRANSESOPHAGEAL ECHOCARDIOGRAM (TEE);  Surgeon: [name], MD;  Location: [facility] OR;  Service: Thoracic;  Laterality: N/A;   Family History  Problem Relation Age of Onset  . Hypertension Mother   . Diabetes Mother   . Alcohol abuse Father    Social History:  reports that he has never smoked. He has never used smokeless tobacco. He reports current alcohol use. He reports that he does not use drugs. Allergies:  Allergies  Allergen Reactions  . Penicillins Other (See Comments)    UNSPECIFIED REACTIONS  Has patient had a PCN reaction causing immediate rash, facial/tongue/throat swelling, SOB or lightheadedness with hypotension: Unk Has patient had a PCN reaction causing severe rash involving mucus membranes or skin necrosis: Unk Has patient had a PCN reaction that required hospitalization: Unk Has patient had a PCN reaction occurring within the last 10 years: No If all of the above answers are "NO", then may proceed with Cephalosporin use.   . Statins Other (See Comments)    Severe rash and back pain  . Morphine And Related Other (See Comments)     hallucinations  . Sglt2 Inhibitors Rash    Candidiasis infection prone   Medications Prior to Admission  Medication Sig Dispense Refill  . acetaminophen (TYLENOL) 325 MG tablet Take 2 tablets (650 mg total) by mouth every 4 (four) hours as needed for headache or mild pain.    . Ascorbic Acid (VITAMIN C PO) Take 1 tablet by mouth daily.    Marland Kitchen aspirin EC 81 MG tablet Take 1 tablet (81 mg total) by mouth daily. 90 tablet 3  . carvedilol (COREG) 3.125 MG tablet TAKE 1 TABLET BY MOUTH TWICE DAILY WITH A MEAL (Patient taking differently: Take 3.125 mg by mouth 2 (two) times daily with a meal. TAKE 1 TABLET BY MOUTH TWICE DAILY WITH A MEAL) 180 tablet 3  . furosemide (LASIX) 40 MG tablet TAKE 1 TABLET BY MOUTH ONCE DAILY IN THE MORNING 2 TABLETS EVERY NIGHT. PLEASE KEEP UPCOMING APPT IN NOVEMBER. 270 tablet 3  . gabapentin (NEURONTIN) 100 MG capsule Take 1 capsule (100 mg total) by mouth 3 (three) times daily. (Patient taking differently: Take 100 mg by mouth 3 (three) times daily. Taking 2 capsules (200mg ) total  at bedtime) 270 capsule 2  . ibuprofen (ADVIL,MOTRIN) 200 MG tablet Take 400 mg by mouth every 6 (  six) hours as needed for headache or moderate pain.    Marland Kitchen insulin glargine (LANTUS) 100 UNIT/ML injection Inject 60 Units into the skin daily. Taking 30 units twice daily    . insulin lispro (HUMALOG) 100 UNIT/ML cartridge Inject 15 Units into the skin 2 (two) times daily with a meal. Please take 15 U twice daily with your two largest meals at least four hours apart    . nitroGLYCERIN (NITROSTAT) 0.4 MG SL tablet Place 1 tablet (0.4 mg total) under the tongue every 5 (five) minutes as needed for chest pain. 25 tablet 1  . potassium chloride SA (K-DUR,KLOR-CON) 20 MEQ tablet TAKE 1 TABLET BY MOUTH ONCE DAILY (Patient taking differently: Take 20 mEq by mouth daily. ) 90 tablet 3  . BAYER MICROLET LANCETS lancets Check blood sugar 3 times a day as instructed 100 each 12  . glucose blood (CONTOUR NEXT  TEST) test strip Check blood sugar 3 times a day as instructed 100 each 12  . Insulin Pen Needle (PEN NEEDLES) 31G X 5 MM MISC 1 each by Does not apply route 3 (three) times daily. 100 each 12    Drug Regimen Review Drug regimen was reviewed and remains appropriate with no significant issues identified.  Home: Home Living Family/patient expects to be discharged to:: Private residence Living Arrangements: Alone Available Help at Discharge: Family, Available 24 hours/day Type of Home: House Home Access: Stairs to enter Technical brewer of Steps: 1 Home Layout: One level(sister's house and his house) Bathroom Shower/Tub: Tub/shower unit(At sisters house) Biochemist, clinical: Standard(Sister's house) Additional Comments: Lives off the grid - [place] and has a room off the back of a barn. Heats by [employer]. Has to walk ~200 yard to get to a shower the friend has built for the pt. Open to air.   Lives With: Alone   Functional History: Prior Function Level of Independence: Independent Comments: adapted to R side deficits from first stroke  Functional Status:  Mobility: Bed Mobility Overal bed mobility: Modified Independent General bed mobility comments: pt received in recliner Transfers Overall transfer level: Needs assistance Equipment used: Rolling walker (2 wheeled) Transfers: Sit to/from Stand Sit to Stand: Min assist Stand pivot transfers: Mod assist, Min assist General transfer comment: min A to steady and pt needing support L side Ambulation/Gait Ambulation/Gait assistance: Min assist Gait Distance (Feet): 125 Feet Assistive device: Rolling walker (2 wheeled) Gait Pattern/deviations: Step-through pattern, Decreased stride length, Drifts right/left, Steppage, Decreased weight shift to left General Gait Details: continues with steppage gait and difficulty discerning when his foot is safely on the floor. Lean to R side for more support and heavy use of RW  Gait velocity: Decreased Gait velocity interpretation: <1.31 ft/sec, indicative of household ambulator    ADL: ADL Overall ADL's : Needs assistance/impaired Eating/Feeding: Modified independent Grooming: Wash/dry hands, Wash/dry face, Oral care, Brushing hair, Sitting, Set up, Supervision/safety Upper Body Bathing: Set up, Sitting Lower Body Bathing: Moderate assistance, Sit to/from stand Upper Body Dressing : Set up, Sitting Lower Body Dressing: Sit to/from stand, Minimal assistance Lower Body Dressing Details (indicate cue type and reason): assist for balance  Toilet Transfer: BSC, Minimal assistance, Ambulation Toilet Transfer Details (indicate cue type and reason): able ambulate to BR with min/HHA.  ataxia noted  Toileting- Water quality scientist and Hygiene: Sit to/from stand, Minimal assistance Functional mobility during ADLs: Minimal assistance General ADL Comments: Pt reports he feels much better after resting for a bit,   Cognition: Cognition  Overall Cognitive Status: Impaired/Different from baseline Arousal/Alertness: Awake/alert Orientation Level: Oriented X4 Attention: Focused Focused Attention: Appears intact Memory: Appears intact Awareness: Appears intact Problem Solving: Appears intact Executive Function: Reasoning Reasoning: Appears intact Safety/Judgment: Appears intact Cognition Arousal/Alertness: Awake/alert Behavior During Therapy: WFL for tasks assessed/performed Overall Cognitive Status: Impaired/Different from baseline Area of Impairment: Safety/judgement Safety/Judgement: Decreased awareness of safety, Decreased awareness of deficits  Physical Exam: Blood pressure (!) 154/72, pulse 67, temperature 98.4 F (36.9 C), temperature source Oral, resp. rate 18, height 5\' 8"  (1.727 m), weight 93.2 kg, SpO2 98 %. Physical Exam  Constitutional: He appears well-developed and well-nourished.  HENT:  Head: Normocephalic and atraumatic.  Eyes: Pupils are  equal, round, and reactive to light.  Neck: Normal range of motion. No tracheal deviation present. No thyromegaly present.  Cardiovascular: Normal rate and regular rhythm.  Murmur heard. Respiratory: Effort normal and breath sounds normal. No respiratory distress. He has no wheezes. He has no rales.  GI: Soft. He exhibits no distension.  Musculoskeletal: Normal range of motion.        General: No deformity or edema.  Neurological:  Patient is alert in no acute distress.  Fair insight to deficits. Lacks peripheral vision to far left field. Functional memory and language.   Follows commands.Left hemifacial sensory loss. Also with hemisensory loss LUE and LLE. Decreased FMC LUE and LE. Motor 4+/5 RUE and RLE, 4 to 4+ LUE and LLE prox to distal. DTR's 2+  Skin: Skin is warm and dry.  Psychiatric: He has a normal mood and affect. His behavior is normal.    Results for orders placed or performed during the hospital encounter of 06/28/18 (from the past 48 hour(s))  Glucose, capillary     Status: Abnormal   Collection Time: 06/29/18  1:31 PM  Result Value Ref Range   Glucose-Capillary 198 (H) 70 - 99 mg/dL  Glucose, capillary     Status: Abnormal   Collection Time: 06/29/18  4:38 PM  Result Value Ref Range   Glucose-Capillary 182 (H) 70 - 99 mg/dL   Comment 1 Notify RN    Comment 2 Document in Chart   Glucose, capillary     Status: Abnormal   Collection Time: 06/29/18  9:53 PM  Result Value Ref Range   Glucose-Capillary 119 (H) 70 - 99 mg/dL  CBC     Status: Abnormal   Collection Time: 06/30/18  5:17 AM  Result Value Ref Range   WBC 3.0 (L) 4.0 - 10.5 K/uL   RBC 4.08 (L) 4.22 - 5.81 MIL/uL   Hemoglobin 12.1 (L) 13.0 - 17.0 g/dL   HCT 35.7 (L) 39.0 - 52.0 %   MCV 87.5 80.0 - 100.0 fL   MCH 29.7 26.0 - 34.0 pg   MCHC 33.9 30.0 - 36.0 g/dL   RDW 12.6 11.5 - 15.5 %   Platelets 163 150 - 400 K/uL   nRBC 0.0 0.0 - 0.2 %    Comment: Performed at [facility], [address]  [place], [place] [address]  Basic metabolic panel     Status: Abnormal   Collection Time: 06/30/18  5:17 AM  Result Value Ref Range   Sodium 137 135 - 145 mmol/L   Potassium 3.6 3.5 - 5.1 mmol/L   Chloride 104 98 - 111 mmol/L   CO2 24 22 - 32 mmol/L   Glucose, Bld 193 (H) 70 - 99 mg/dL   BUN 13 8 - 23 mg/dL   Creatinine, Ser 0.84  0.61 - 1.24 mg/dL   Calcium 8.9 8.9 - 10.3 mg/dL   GFR calc non Af Amer >60 >60 mL/min   GFR calc Af Amer >60 >60 mL/min   Anion gap 9 5 - 15    Comment: Performed at [facility] [address] [place],  [address]  Glucose, capillary     Status: Abnormal   Collection Time: 06/30/18  6:17 AM  Result Value Ref Range   Glucose-Capillary 220 (H) 70 - 99 mg/dL   Comment 1 Notify RN   Glucose, capillary     Status: Abnormal   Collection Time: 06/30/18 11:27 AM  Result Value Ref Range   Glucose-Capillary 276 (H) 70 - 99 mg/dL  Glucose, capillary     Status: Abnormal   Collection Time: 06/30/18  4:21 PM  Result Value Ref Range   Glucose-Capillary 158 (H) 70 - 99 mg/dL   Comment 1 Notify RN    Comment 2 Document in Chart   Glucose, capillary     Status: Abnormal   Collection Time: 06/30/18 10:06 PM  Result Value Ref Range   Glucose-Capillary 128 (H) 70 - 99 mg/dL  Glucose, capillary     Status: Abnormal   Collection Time: 07/01/18  6:06 AM  Result Value Ref Range   Glucose-Capillary 101 (H) 70 - 99 mg/dL  Glucose, capillary     Status: Abnormal   Collection Time: 07/01/18  7:56 AM  Result Value Ref Range   Glucose-Capillary 227 (H) 70 - 99 mg/dL   Ct Head Wo Contrast  Result Date: 06/29/2018 CLINICAL DATA:  68 year old male with right P2 occlusion and small infarcts in the right hippocampus and occipital lobe on CTA and MRI yesterday. Worsening neuro symptoms, query hemorrhagic conversion. EXAM: CT HEAD WITHOUT CONTRAST TECHNIQUE: Contiguous axial images were obtained from the base of the skull through the vertex without intravenous contrast.  COMPARISON:  Brain MRI 06/28/2018.  CTA head and neck 06/28/2018. FINDINGS: Brain: No acute intracranial hemorrhage identified. No midline shift, mass effect, or evidence of intracranial mass lesion. Progressed hypodensity in the right hippocampus and occipital lobe white matter since the CT yesterday (coronal image 46 and series 3, image 17). The occipital white matter involvement is new or increased from the DWI yesterday. Stable gray-white matter differentiation elsewhere including in the right thalamus. Basilar cisterns remain normal. No ventriculomegaly. Vascular: Calcified atherosclerosis at the skull base. Skull: No acute osseous abnormality identified. Sinuses/Orbits: Visualized paranasal sinuses and mastoids are stable and well pneumatized. Other: No acute orbit or scalp soft tissue findings. IMPRESSION: 1. Negative for hemorrhagic transformation of the right PCA territory ischemia, but increased hypodensity in the right occipital lobe white matter and right hippocampus since yesterday. 2. No new intracranial abnormality. Electronically Signed   By: Genevie Ann M.D.   On: 06/29/2018 19:54       Medical Problem List and Plan: 1.  Left side weakness secondary to small acute infarct mesial right temporal lobe and occipital lobe most likely due to atrial fibrillation  -admit to inpatient rehab 2.  Antithrombotics: -DVT/anticoagulation:  ELIQUIS  -antiplatelet therapy:  Aspirin 81 mg daily 3. Pain Management:  Neurontin 100 mg 3 times a day 4. Mood:  Provide emotional support  -antipsychotic agents: N/A 5. Neuropsych: This patient is capable of making decisions on his own behalf. 6. Skin/Wound Care:  Routine skin checks 7. Fluids/Electrolytes/Nutrition:  Routine in and out  with follow-up chemistries 8.CAD/CABG. Continue aspirin. No chest pain or shortness of  breath 9. History of atrial fibrillation. Continue Eliquis.Cardiac rate controlled on exam today 10. Diabetes mellitus with peripheral  neuropathy. Hemoglobin A1c 12.Lantus insulin 18 units daily. NovoLog 12 units 3 times a day.  -monitor CBG's  -adjust regimen as necessary 11. Permissive hypertension. Monitor with increased mobility. Patient on Coreg 3.125 mg twice a day,Lasix 40 mg a.m. 2 tablets every p.m. Resume as needed 12. Hyperlipidemia. Lipitor      Lavon Paganini Angiulli, PA-C 07/01/2018

## 2018-07-01 NOTE — Progress Notes (Signed)
Physical Therapy Treatment Patient Details Name: Mark Cabrera MRN: [id number] DOB: 11-21-1950 Today's Date: 07/01/2018    History of Present Illness Pt is a 68 y/o male with a PMH significant for CVA (residual R side deficits), DM, CAD, a-fib, CHF. He presents with stroke-like symptoms and MRI revealed small acute infarcts in the medial right temporal lobe and right occipital lobe.     PT Comments    Pt progressing towards physical therapy goals. Less enthusiastic in general today - it appears he is more accepting of his deficits and reports he "knows he needs more therapy". Pt requiring consistent min assist and cues for safety awareness. Overall mobilizing safer with the RW than he did on eval without an AD. Noted 1 significant seated LOB to the R while attempting to adjust his R sock while sitting EOB. Will continue to follow and progress as able per POC.   Follow Up Recommendations  CIR;Supervision/Assistance - 24 hour     Equipment Recommendations  Other (comment)(TBD)    Recommendations for Other Services Rehab consult     Precautions / Restrictions Precautions Precautions: Fall Precaution Comments: Prior R side deficits from last stroke Restrictions Weight Bearing Restrictions: No    Mobility  Bed Mobility Overal bed mobility: Needs Assistance Bed Mobility: Supine to Sit     Supine to sit: Supervision     General bed mobility comments: Light supervision provided for safety. Pt requiring increased time and noted 1 right lateral LOB while sitting EOB and attempting to adjust R sock.   Transfers Overall transfer level: Needs assistance Equipment used: Rolling walker (2 wheeled) Transfers: Sit to/from Stand Sit to Stand: Min assist         General transfer comment: Pt stood with minor min assist however pt standing and bringing L foot up very quickly and very high "to get it moving". Pt reports tingling in his foot and states he needs to do this "to get the  circulation going again."  Ambulation/Gait Ambulation/Gait assistance: Min assist Gait Distance (Feet): 200 Feet Assistive device: Rolling walker (2 wheeled) Gait Pattern/deviations: Step-through pattern;Decreased stride length;Drifts right/left;Steppage;Decreased weight shift to left Gait velocity: Decreased Gait velocity interpretation: 1.31 - 2.62 ft/sec, indicative of limited community ambulator General Gait Details: continues with steppage gait and difficulty discerning when his foot is safely on the floor. Lean to R side for more support and heavy use of RW   Stairs             Wheelchair Mobility    Modified Rankin (Stroke Patients Only) Modified Rankin (Stroke Patients Only) Pre-Morbid Rankin Score: Moderate disability Modified Rankin: Moderately severe disability     Balance Overall balance assessment: Needs assistance Sitting-balance support: Feet supported;No upper extremity supported Sitting balance-Leahy Scale: Fair     Standing balance support: No upper extremity supported Standing balance-Leahy Scale: Poor Standing balance comment: Pt requires min - mod A for static standing without UE support                            Cognition Arousal/Alertness: Awake/alert Behavior During Therapy: WFL for tasks assessed/performed Overall Cognitive Status: Impaired/Different from baseline Area of Impairment: Safety/judgement                         Safety/Judgement: Decreased awareness of safety;Decreased awareness of deficits            Exercises  General Comments        Pertinent Vitals/Pain Pain Assessment: No/denies pain    Home Living                      Prior Function            PT Goals (current goals can now be found in the care plan section) Acute Rehab PT Goals Patient Stated Goal: to get back to normal  PT Goal Formulation: With patient Time For Goal Achievement: 07/06/18 Potential to Achieve  Goals: Good Progress towards PT goals: Progressing toward goals    Frequency    Min 4X/week      PT Plan Discharge plan needs to be updated    Co-evaluation              AM-PAC PT "6 Clicks" Mobility   Outcome Measure  Help needed turning from your back to your side while in a flat bed without using bedrails?: A Little Help needed moving from lying on your back to sitting on the side of a flat bed without using bedrails?: A Little Help needed moving to and from a bed to a chair (including a wheelchair)?: A Little Help needed standing up from a chair using your arms (e.g., wheelchair or bedside chair)?: A Lot Help needed to walk in hospital room?: A Lot Help needed climbing 3-5 steps with a railing? : Total 6 Click Score: 14    End of Session Equipment Utilized During Treatment: Gait belt Activity Tolerance: Patient tolerated treatment well Patient left: in chair;with call bell/phone within reach Nurse Communication: Mobility status PT Visit Diagnosis: Unsteadiness on feet (R26.81);Other symptoms and signs involving the nervous system (R29.898)     Time: 6283-1517 PT Time Calculation (min) (ACUTE ONLY): 23 min  Charges:  $Gait Training: 23-37 mins                     Rolinda Roan, PT, DPT Acute Rehabilitation Services Pager: [phone] Office: [phone]    Thelma Comp 07/01/2018, 11:57 AM

## 2018-07-01 NOTE — PMR Pre-admission (Signed)
PMR Admission Coordinator Pre-Admission Assessment  Patient: Mark Cabrera is an 68 y.o., male MRN: [id number] DOB: 1950/03/14 Height: '5\' 8"'  (172.7 cm) Weight: 93.2 kg  Insurance Information HMO:     PPO:      PCP:      IPA:      80/20:      OTHER:  PRIMARY: Medicare a and b      Policy#: [id number]      Subscriber: pt Benefits:  Phone #: passport one online     Name: 07/01/2018 Eff. Date: 09/10/2013     Deduct: $1408      Out of Pocket Max: none      Life Max: none CIR: 100%      SNF: 20 full days Outpatient: 80%     Co-Pay: 20% Home Health: 100%      Co-Pay: none DME: 80%     Co-Pay: 20% Providers: pt choice  SECONDARY: Medicaid [place] access      Policy#: [id number]      Subscriber: pt 4/10 [facility]  Medicaid Application Date:       Case Manager:  Disability Application Date:       Case Worker:   The "Data Collection Information Summary" for patients in Inpatient Rehabilitation Facilities with attached "Privacy Act [place] Records" was provided and verbally reviewed with: Patient  Emergency Contact Information Contact Information    Name Relation Home Work Mobile   Sandoz,[name] Brother   Wildwood, Quinton Daughter   [phone]      Current Medical History  Patient Admitting Diagnosis: right temporal/occipital CVA  History of Present Illness: Mark Cabrera is a 68 year old right-handed male with history of diabetes mellitus maintained on Lantus insulin, CAD with CABG/AVR with postoperative atrial fibrillation,vertebral artery dissection,  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 Presented 06/28/2018 with left-sided weakness and tingling. CT scan negative. Patient did not receive TPA. CT angiogram of head and neck occlusion proximal right P2 segment.echocardiogram with ejection fraction of 50-55% severely dilated left atrium. MRI showed small acute infarct in the mesial right temporal occipital lobe as well as chronic left  basal ganglia lacunar infarction. Neurology consulted presently maintained on aspirin as well as Eliquis. Tolerating a regular diet.   Complete NIHSS TOTAL: 5  Patient's medical record from [facility]  has been reviewed by the rehabilitation admission coordinator and physician.  Past Medical History  Past Medical History:  Diagnosis Date  . Acute diastolic congestive heart failure ([place])   . Aortic valve stenosis s/p AVR 2018  . Atrial fibrillation ([place]) - post-op CABG    04/2016 CHA2DS2VAS score = 5  . Atypical nevi   . Coronary artery disease s/p 2 vessel CABG   . Depression    "years ago"  . Diabetes mellitus   . Dyspnea    in the past   . GERD (gastroesophageal reflux disease)   . Heart murmur   . Hepatic cirrhosis ([place]) 06/29/2017  . History of kidney stones   . Hyperlipidemia    hx of transaminitis secondary to statin and he has decided not to use statins secondary to potential side effects.  . Hypertension   . Nephrolithiasis   . Osteoarthritis, knee   . Sleep apnea     Central apnea. Not using cpap  . Stroke [facility]) 2007  . Transaminitis     Statin-induced  . 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 2-D echo was normal no embolic source found.    Family History   family history includes Alcohol abuse in his father; Diabetes in his mother; Hypertension in his mother.  Prior Rehab/Hospitalizations Has the patient had prior rehab or hospitalizations prior to admission? yes  Has the patient had major surgery during 100 days prior to admission? No   Current Medications  Current Facility-Administered Medications:  .  acetaminophen (TYLENOL) tablet 650 mg, 650 mg, Oral, Q4H PRN, 650 mg  at 06/30/18 0736 **OR** acetaminophen (TYLENOL) solution 650 mg, 650 mg, Per Tube, Q4H PRN **OR** acetaminophen (TYLENOL) suppository 650 mg, 650 mg, Rectal, Q4H PRN, Velna Ochs, MD .  apixaban (ELIQUIS) tablet 5 mg, 5 mg, Oral, BID, [name], MD, 5 mg at 07/01/18 1144 .  aspirin EC tablet 81 mg, 81 mg, Oral, Daily, Velna Ochs, MD, 81 mg at 07/01/18 0931 .  gabapentin (NEURONTIN) capsule 100 mg, 100 mg, Oral, TID, Velna Ochs, MD, 100 mg at 07/01/18 0931 .  insulin aspart (novoLOG) injection 12 Units, 12 Units, Subcutaneous, TID WC, Kathi Ludwig, MD, 12 Units at 07/01/18 1144 .  insulin glargine (LANTUS) injection 18 Units, 18 Units, Subcutaneous, BID, Kathi Ludwig, MD, 18 Units at 07/01/18 0931 .  LORazepam (ATIVAN) injection 1 mg, 1 mg, Intravenous, Once, Kerney Elbe, MD .  ondansetron [facility]) tablet 4 mg, 4 mg, Oral, Q8H PRN, Seawell, Jaimie A, DO .  potassium chloride SA (K-DUR) CR tablet 20 mEq, 20 mEq, Oral, Daily, Kathi Ludwig, MD, 20 mEq at 07/01/18 0931 .  senna-docusate (Senokot-S) tablet 1 tablet, 1 tablet, Oral, QHS PRN, Velna Ochs, MD .  sodium chloride flush (NS) 0.9 % injection 3 mL, 3 mL, Intravenous, Once, Velna Ochs, MD  Patients Current Diet:  Diet Order            Diet - low sodium heart healthy        Diet heart healthy/carb modified Room service appropriate? No; Fluid consistency: Thin  Diet effective now              Precautions / Restrictions Precautions Precautions: Fall Precaution Comments: Prior R side deficits from last stroke Restrictions Weight Bearing Restrictions: No   Has the patient had 2 or more falls or a fall with injury in the past year? No  Prior Activity Level Community (5-7x/wk): Independent; does not drive for 2 years; no AD  Prior Functional Level Self Care: Did the patient need help bathing, dressing, using the toilet or eating? Independent  Indoor Mobility: Did the  patient need assistance with walking from room to room (with or without device)? Independent  Stairs: Did the patient need assistance with internal or external stairs (with or without device)? Independent  Functional Cognition: Did the patient need help planning regular tasks such as shopping or remembering to take medications? Independent  Home Assistive Devices / Equipment Home Assistive Devices/Equipment: Eyeglasses, CBG Meter  Prior Device Use: Indicate devices/aids used by the patient prior to current illness, exacerbation or injury? None of the above  Current Functional Level Cognition  Arousal/Alertness: Awake/alert Overall Cognitive Status: Impaired/Different from baseline Orientation Level: Oriented X4 Safety/Judgement: Decreased awareness of safety, Decreased awareness of deficits Attention: Focused Focused Attention: Appears intact Memory: Appears intact Awareness: Appears intact Problem  Solving: Appears intact Executive Function: Reasoning Reasoning: Appears intact Safety/Judgment: Appears intact    Extremity Assessment (includes Sensation/Coordination)  Upper Extremity Assessment: LUE deficits/detail LUE Deficits / Details: mild dysmetria.  Pt with improved coordination as compared to previous session - he was better able to don/doff socks without as many errors  LUE Coordination: decreased gross motor  Lower Extremity Assessment: Defer to PT evaluation RLE Deficits / Details: Bilaterally, MMT revealed 4+/5 strength in quads, hamstrings, hip flexors and ankle DF. Decreased sensation to light touch bilaterally but worse on the R.     ADLs  Overall ADL's : Needs assistance/impaired Eating/Feeding: Modified independent Grooming: Wash/dry hands, Wash/dry face, Oral care, Brushing hair, Sitting, Set up, Supervision/safety Upper Body Bathing: Set up, Sitting Lower Body Bathing: Moderate assistance, Sit to/from stand Upper Body Dressing : Set up, Sitting Lower Body  Dressing: Sit to/from stand, Minimal assistance Lower Body Dressing Details (indicate cue type and reason): assist for balance  Toilet Transfer: BSC, Minimal assistance, Ambulation Toilet Transfer Details (indicate cue type and reason): able ambulate to BR with min/HHA.  ataxia noted  Toileting- Water quality scientist and Hygiene: Sit to/from stand, Minimal assistance Functional mobility during ADLs: Minimal assistance General ADL Comments: Pt reports he feels much better after resting for a bit,     Mobility  Overal bed mobility: Needs Assistance Bed Mobility: Supine to Sit Supine to sit: Supervision General bed mobility comments: Light supervision provided for safety. Pt requiring increased time and noted 1 right lateral LOB while sitting EOB and attempting to adjust R sock.     Transfers  Overall transfer level: Needs assistance Equipment used: Rolling walker (2 wheeled) Transfers: Sit to/from Stand Sit to Stand: Min assist Stand pivot transfers: Mod assist, Min assist General transfer comment: Pt stood with minor min assist however pt standing and bringing L foot up very quickly and very high "to get it moving". Pt reports tingling in his foot and states he needs to do this "to get the circulation going again."    Ambulation / Gait / Stairs / Wheelchair Mobility  Ambulation/Gait Ambulation/Gait assistance: Herbalist (Feet): 200 Feet Assistive device: Rolling walker (2 wheeled) Gait Pattern/deviations: Step-through pattern, Decreased stride length, Drifts right/left, Steppage, Decreased weight shift to left General Gait Details: continues with steppage gait and difficulty discerning when his foot is safely on the floor. Lean to R side for more support and heavy use of RW Gait velocity: Decreased Gait velocity interpretation: 1.31 - 2.62 ft/sec, indicative of limited community ambulator    Posture / Balance Balance Overall balance assessment: Needs  assistance Sitting-balance support: Feet supported, No upper extremity supported Sitting balance-Leahy Scale: Fair Standing balance support: No upper extremity supported Standing balance-Leahy Scale: Poor Standing balance comment: Pt requires min - mod A for static standing without UE support High Level Balance Comments: performed strain/ counterstrain activities in sitting and minimally in standing.     Special needs/care consideration BiPAP/CPAP  N/a CPM  N/a Continuous Drip IV  N/a Dialysis  N/a Life Vest  N/a Oxygen  N/a Special Bed  N/a Trach Size  N/a Wound Vac n/a Skin  abrasions to BLE Bowel mgmt:  Continent LBM 4/19 Bladder mgmt:  continent Diabetic mgmt:  Hgb A1c 12.0 Behavioral consideration  N/a Chemo/radiation  N/a   Previous Home Environment  Living Arrangements: Alone  Lives With: Alone Available Help at Discharge: (plans to stay at sister's home at d/c) Type of Home: Other(Comment)(lives off the grid. ROom built onto  a barn; no running water) Home Layout: One level Home Access: Level entry Entrance Stairs-Number of Steps: 1 Bathroom Shower/Tub: (no bathroom; walk 200 yards to friend's basement to shower, ) Bathroom Toilet: Standard(Sister's house) [place]: No Additional Comments: Lives off the grid - [place] and has a room off the back of a barn. Heats by [employer]. Has to walk ~200 yard to get to a shower the friend has built for the pt. Open to air.   Discharge Living Setting Plans for Discharge Living Setting: Lives with (comment)(plans to go stay with sister) Type of Home at Discharge: House Discharge Home Layout: One level Discharge Home Access: Stairs to enter Entrance Stairs-Rails: None Entrance Stairs-Number of Steps: 1 Discharge Bathroom Shower/Tub: Tub/shower unit, Curtain Discharge Bathroom Toilet: Standard Discharge Bathroom Accessibility: Yes How Accessible: Accessible via walker Does the patient have any  problems obtaining your medications?: No  Social/Family/Support Systems Patient Roles: Parent Contact Information: brother, Luciana Axe, or daughter, [name] Anticipated Caregiver: sister at her home Anticipated Caregiver's Contact Information: see above Ability/Limitations of Caregiver: sister retired Building control surveyor Availability: 24/7 Discharge Plan Discussed with Primary Caregiver: Yes Is Caregiver In Agreement with Plan?: No Does Caregiver/Family have Issues with Lodging/Transportation while Pt is in Rehab?: No  Goals/Additional Needs Patient/Family Goal for Rehab: Mod I to supervision with PT and OT Expected length of stay: ELOS 7 to 10 days Pt/Family Agrees to Admission and willing to participate: Yes Program Orientation Provided & Reviewed with Pt/Caregiver Including Roles  & Responsibilities: Yes  Decrease burden of Care through IP rehab admission: n/a  Possible need for SNF placement upon discharge:  Not anticipated  Patient Condition: I have reviewed medical records from [facility] , spoken with CM, and patient. I met with patient at the bedside for inpatient rehabilitation assessment.  Patient will benefit from ongoing PT and OT, can actively participate in 3 hours of therapy a day 5 days of the week, and can make measurable gains during the admission.  Patient will also benefit from the coordinated team approach during an Inpatient Acute Rehabilitation admission.  The patient will receive intensive therapy as well as Rehabilitation physician, nursing, social worker, and care management interventions.  Due to safety, disease management, medication administration and patient education the patient requires 24 hour a day rehabilitation nursing.  The patient is currently min assist with mobility and basic ADLs.  Discharge setting and therapy post discharge at home with home health is anticipated.  Patient has agreed to participate in the Acute Inpatient Rehabilitation Program and will admit  today.  Preadmission Screen Completed By:  Annamary Rummage MSN 07/01/2018 1:40 PM ______________________________________________________________________   Discussed status with Dr. Naaman Plummer  on  07/01/2018  at  1346 and received approval for admission today.  Admission Coordinator:  Cleatrice Burke, RN, MSN time  1346 Date  07/01/2018   Assessment/Plan: Diagnosis: right temporal/occipital infarct 1. Does the need for close, 24 hr/day Medical supervision in concert with the patient's rehab needs make it unreasonable for this patient to be served in a less intensive setting? Yes 2. Co-Morbidities requiring supervision/potential complications: CAD, DM, a fib, prior CVA 3. Due to bladder management, bowel management, safety, skin/wound care, disease management, medication administration, pain management and patient education, does the patient require 24 hr/day rehab nursing? Yes 4. Does the patient require coordinated care of a physician, rehab nurse, PT (1-2 hrs/day, 5 days/week) and OT (1-2 hrs/day, 5 days/week) to address physical and functional deficits  in the context of the above medical diagnosis(es)? Yes Addressing deficits in the following areas: balance, endurance, locomotion, strength, transferring, bowel/bladder control, bathing, dressing, feeding, grooming, toileting and psychosocial support 5. Can the patient actively participate in an intensive therapy program of at least 3 hrs of therapy 5 days a week? Yes 6. The potential for patient to make measurable gains while on inpatient rehab is excellent 7. Anticipated functional outcomes upon discharge from inpatients are: modified independent and supervision PT, modified independent and supervision OT, n/a SLP 8. Estimated rehab length of stay to reach the above functional goals is: 7-10 days 9. Anticipated D/C setting: Home 10. Anticipated post D/C treatments: Loop therapy 11. Overall Rehab/Functional Prognosis: excellent  MD  Signature: Meredith Staggers, MD, [facility] 07/01/2018

## 2018-07-01 NOTE — Progress Notes (Signed)
  Date: 07/01/2018  Patient name: Mark Cabrera  Medical record number: [id number]  Date of birth: Nov 05, 1950   I have seen and evaluated this patient and I have discussed the plan of care with the house staff. Please see their note for complete details. I concur with their findings with the following additions/corrections: [name] was seen this morning on team rounds.  He is in agreement with inpatient rehab consult.  We are not yet starting any antihypertensives as he had resumption of symptoms when his blood pressure became normal on the 18th.  Await CIR consult.  [name], MD 07/01/2018, 11:24 AM

## 2018-07-01 NOTE — Progress Notes (Signed)
Received pt. As a new admit. Pt. Has been oriented to the rehab routine.Safety plan was explained,fall prevention plan was explained as well.

## 2018-07-01 NOTE — TOC Benefit Eligibility Note (Signed)
Transition of Care [facility]) Benefit Eligibility Note    Patient Details  Name: Mark Cabrera MRN: [id number] Date of Birth: 1950/08/05   Medication/Dose: REPATHA  SQ 18 MG  ONCE A MONTH(EVOLOCUMAB: NON-FORMULARY)     Tier: 3 Drug  Prescription Coverage Preferred Pharmacy: [facility] with Person/Company/Phone Number:: [facility]([employer]  RX #  [phone])     Prior Approval: Yes(# [phone])  Deductible: Met  Additional Notes: SECONDARY INS : MEDICAID [place] ACCESS(EFF-DATE 12-11-2016  CO-PAY- $ 3.90 FOR EACH PRESCRIPTION)    Memory Argue Phone Number: 07/01/2018, 11:54 AM

## 2018-07-01 NOTE — Progress Notes (Signed)
Pharmacy - Eliquis  68 year old male to begin Eliquis for Afib Scr stable, Weight > 60 kg  Plan: Eliquis 5 mg po BID Pharmacy to sign off  Thank you Anette Guarneri, PharmD [phone]

## 2018-07-02 ENCOUNTER — Inpatient Hospital Stay (HOSPITAL_COMMUNITY): Payer: Medicare Other | Admitting: Occupational Therapy

## 2018-07-02 ENCOUNTER — Inpatient Hospital Stay (HOSPITAL_COMMUNITY): Payer: Medicare Other | Admitting: Physical Therapy

## 2018-07-02 ENCOUNTER — Inpatient Hospital Stay (HOSPITAL_COMMUNITY): Payer: Medicare Other

## 2018-07-02 LAB — GLUCOSE, CAPILLARY
Glucose-Capillary: 153 mg/dL — ABNORMAL HIGH (ref 70–99)
Glucose-Capillary: 165 mg/dL — ABNORMAL HIGH (ref 70–99)
Glucose-Capillary: 100 mg/dL — ABNORMAL HIGH (ref 70–99)
Glucose-Capillary: 167 mg/dL — ABNORMAL HIGH (ref 70–99)

## 2018-07-02 LAB — CBC WITH DIFFERENTIAL/PLATELET
Abs Immature Granulocytes: 0.01 10*3/uL (ref 0.00–0.07)
Basophils Absolute: 0 10*3/uL (ref 0.0–0.1)
Basophils Relative: 0 %
Eosinophils Absolute: 0.1 10*3/uL (ref 0.0–0.5)
Eosinophils Relative: 2 %
HCT: 36.6 % — ABNORMAL LOW (ref 39.0–52.0)
Hemoglobin: 12.2 g/dL — ABNORMAL LOW (ref 13.0–17.0)
Immature Granulocytes: 0 %
Lymphocytes Relative: 43 %
Lymphs Abs: 1.4 10*3/uL (ref 0.7–4.0)
MCH: 29.5 pg (ref 26.0–34.0)
MCHC: 33.3 g/dL (ref 30.0–36.0)
MCV: 88.4 fL (ref 80.0–100.0)
Monocytes Absolute: 0.2 10*3/uL (ref 0.1–1.0)
Monocytes Relative: 6 %
Neutro Abs: 1.6 10*3/uL — ABNORMAL LOW (ref 1.7–7.7)
Neutrophils Relative %: 49 %
Platelets: 165 10*3/uL (ref 150–400)
RBC: 4.14 MIL/uL — ABNORMAL LOW (ref 4.22–5.81)
RDW: 12.5 % (ref 11.5–15.5)
WBC: 3.3 10*3/uL — ABNORMAL LOW (ref 4.0–10.5)
nRBC: 0 % (ref 0.0–0.2)

## 2018-07-02 LAB — COMPREHENSIVE METABOLIC PANEL
ALT: 18 U/L (ref 0–44)
AST: 17 U/L (ref 15–41)
Albumin: 3.1 g/dL — ABNORMAL LOW (ref 3.5–5.0)
Alkaline Phosphatase: 61 U/L (ref 38–126)
Anion gap: 9 (ref 5–15)
BUN: 14 mg/dL (ref 8–23)
CO2: 23 mmol/L (ref 22–32)
Calcium: 9 mg/dL (ref 8.9–10.3)
Chloride: 105 mmol/L (ref 98–111)
Creatinine, Ser: 0.96 mg/dL (ref 0.61–1.24)
GFR calc Af Amer: >60 mL/min (ref >60)
GFR calc non Af Amer: >60 mL/min (ref >60)
Glucose, Bld: 172 mg/dL — ABNORMAL HIGH (ref 70–99)
Potassium: 4.3 mmol/L (ref 3.5–5.1)
Sodium: 137 mmol/L (ref 135–145)
Total Bilirubin: 0.8 mg/dL (ref 0.3–1.2)
Total Protein: 6.1 g/dL — ABNORMAL LOW (ref 6.5–8.1)

## 2018-07-02 MED ORDER — DIPHENHYDRAMINE HCL 25 MG PO CAPS
25.0000 mg | ORAL_CAPSULE | Freq: Once | ORAL | Status: AC
  Administered 2018-07-02: 21:00:00 25 mg via ORAL
  Filled 2018-07-02: qty 1

## 2018-07-02 MED ORDER — MUSCLE RUB 10-15 % EX CREA
TOPICAL_CREAM | CUTANEOUS | Status: DC | PRN
  Filled 2018-07-02: qty 85

## 2018-07-02 NOTE — Discharge Instructions (Addendum)
Inpatient Rehab Discharge Instructions  Mark Cabrera Discharge date and time: No discharge date for patient encounter.   Activities/Precautions/ Functional Status: Activity: activity as tolerated Diet: diabetic diet Wound Care: none needed Functional status:  ___ No restrictions     ___ Walk up steps independently ___ 24/7 supervision/assistance   ___ Walk up steps with assistance ___ Intermittent supervision/assistance  ___ Bathe/dress independently ___ Walk with walker     _x__ Bathe/dress with assistance ___ Walk Independently    ___ Shower independently ___ Walk with assistance    ___ Shower with assistance ___ No alcohol     ___ Return to work/school ________  COMMUNITY REFERRALS UPON DISCHARGE:   Home Health:   PT     OT     Agency:  [facility]:  [phone] Medical Equipment/Items Ordered:  Tub transfer bench  Agency/Supplier:  [employer]      Phone:  [phone]  GENERAL COMMUNITY RESOURCES FOR PATIENT/FAMILY: Support Groups:  [facility] Stroke Support Group - on hold for COVID-19                              Meets the second Thursday of each month from 6 - 7 PM (except June, July, August)                              [facility], [address], [place]                              For more information, call [phone]  Special Instructions: No driving smoking or alcohol STROKE/TIA DISCHARGE INSTRUCTIONS SMOKING Cigarette smoking nearly doubles your risk of having a stroke & is the single most alterable risk factor  If you smoke or have smoked in the last 12 months, you are advised to quit smoking for your health.  Most of the excess cardiovascular risk related to smoking disappears within a year of stopping.  Ask you doctor about anti-smoking medications  [place] Quit Line: 1-800-QUIT NOW  Free Smoking Cessation Classes [phone]  CHOLESTEROL Know your levels; limit fat &  cholesterol in your diet  Lipid Panel     Component Value Date/Time   CHOL 274 (H) 06/29/2018 0420   CHOL 323 (H) 01/18/2018 0900   TRIG 242 (H) 06/29/2018 0420   HDL 45 06/29/2018 0420   HDL 52 01/18/2018 0900   CHOLHDL 6.1 06/29/2018 0420   VLDL 48 (H) 06/29/2018 0420   LDLCALC 181 (H) 06/29/2018 0420   LDLCALC 223 (H) 01/18/2018 0900      Many patients benefit from treatment even if their cholesterol is at goal.  Goal: Total Cholesterol (CHOL) less than 160  Goal:  Triglycerides (TRIG) less than 150  Goal:  HDL greater than 40  Goal:  LDL (LDLCALC) less than 100   BLOOD PRESSURE American Stroke Association blood pressure target is less that 120/80 mm/Hg  Your discharge blood pressure is:  BP: (!) 144/84  Monitor your blood pressure  Limit your salt and alcohol intake  Many individuals will require more than one medication for high blood pressure  DIABETES (A1c is a blood sugar average for last 3 months) Goal HGBA1c is under 7% (HBGA1c is blood sugar average for last 3 months)  Diabetes:  Lab Results  °Component Value Date  ° HGBA1C 12.0 (H) 06/29/2018  ° ° · Your HGBA1c can be lowered with medications, healthy diet, and exercise. °· Check your blood sugar as directed by your physician °· Call your physician if you experience unexplained or low blood sugars.  °PHYSICAL ACTIVITY/REHABILITATION Goal is 30 minutes at least 4 days per week  °Activity: Increase activity slowly, °Therapies: Physical Therapy: Home Health °Return to work:  · Activity decreases your risk of heart attack and stroke and makes your heart stronger.  It helps control your weight and blood pressure; helps you relax and can improve your mood. °· Participate in a regular exercise program. °· Talk with your doctor about the best form of exercise for you (dancing, walking, swimming, cycling).  °DIET/WEIGHT Goal is to maintain a healthy weight  °Your discharge diet is:  °Diet Order   °       °  Diet heart  healthy/carb modified Room service appropriate? No; Fluid consistency: Thin  Diet effective now    °  °  °  °  °  liquids °Your height is:  Height: 5' 8" (172.7 cm) °Your current weight is: Weight: 93.2 kg °Your Body Mass Index (BMI) is:  BMI (Calculated): 31.25 · Following the type of diet specifically designed for you will help prevent another stroke. °· Your goal weight range is:   °· Your goal Body Mass Index (BMI) is 19-24. °· Healthy food habits can help reduce 3 risk factors for stroke:  High cholesterol, hypertension, and excess weight.  °RESOURCES Stroke/Support Group:  Call [phone] °  °STROKE EDUCATION PROVIDED/REVIEWED AND GIVEN TO PATIENT Stroke warning signs and symptoms °How to activate emergency medical system (call 911). °Medications prescribed at discharge. °Need for follow-up after discharge. °Personal risk factors for stroke. °Pneumonia vaccine given:  °Flu vaccine given:  °My questions have been answered, the writing is legible, and I understand these instructions.  I will adhere to these goals & educational materials that have been provided to me after my discharge from the hospital.  ° ° ° ° °My questions have been answered and I understand these instructions. I will adhere to these goals and the provided educational materials after my discharge from the hospital. ° °Patient/Caregiver Signature _______________________________ Date __________ ° °Clinician Signature _______________________________________ Date __________ ° °Please bring this form and your medication list with you to all your follow-up doctor's appointments.  ° ° ° ° °Per conversation with Diabetes coordinator:  °- consider placing insulin pen, along with pen needle in front shirt pocket OR Consider making a "diabetes kit" that has your insulin, pen needles and meter in it. Take with you where you go.  °- Make notes around your work station to help remind you to take insulin. °- We talk about the importance of insulin timing.  Remember how important it is to not miss doses and take around the same time each day. °- Goal blood sugars are <180 mg/dL. Call MD if consistently above 180 or if experiencing low blood sugars <70mg/dL.  °- Reserve juice for when you have low blood sugars ONLY. ° °Information on my medicine - ELIQUIS® (apixaban) ° °This medication education was reviewed with me or my healthcare representative as part of my discharge preparation.   ° °Why was Eliquis® prescribed for you? °Eliquis® was prescribed for you to reduce the risk of a blood clot forming that can cause a stroke if you have a medical condition called atrial fibrillation (a type   of irregular heartbeat). ° °What do You need to know about Eliquis® ? °Take your Eliquis® TWICE DAILY - one tablet in the morning and one tablet in the evening with or without food. If you have difficulty swallowing the tablet whole please discuss with your pharmacist how to take the medication safely. ° °Take Eliquis® exactly as prescribed by your doctor and DO NOT stop taking Eliquis® without talking to the doctor who prescribed the medication.  Stopping may increase your risk of developing a stroke.  Refill your prescription before you run out. ° °After discharge, you should have regular check-up appointments with your healthcare provider that is prescribing your Eliquis®.  In the future your dose may need to be changed if your kidney function or weight changes by a significant amount or as you get older. ° °What do you do if you miss a dose? °If you miss a dose, take it as soon as you remember on the same day and resume taking twice daily.  Do not take more than one dose of ELIQUIS at the same time to make up a missed dose. ° °Important Safety Information °A possible side effect of Eliquis® is bleeding. You should call your healthcare provider right away if you experience any of the following: °? Bleeding from an injury or your nose that does not stop. °? Unusual colored urine (red  or dark Bot[place]) or unusual colored stools (red or black). °? Unusual bruising for unknown reasons. °? A serious fall or if you hit your head (even if there is no bleeding). ° °Some medicines may interact with Eliquis® and might increase your risk of bleeding or clotting while on Eliquis®. To help avoid this, consult your healthcare provider or pharmacist prior to using any new prescription or non-prescription medications, including herbals, vitamins, non-steroidal anti-inflammatory drugs (NSAIDs) and supplements. ° °This website has more information on Eliquis® (apixaban): http://www.eliquis.com/eliquis/home ° °

## 2018-07-02 NOTE — Progress Notes (Signed)
Inpatient Rehabilitation  Patient information reviewed and entered into eRehab system by Aleksander Edmiston M. Teleshia Lemere, M.A., CCC/SLP, PPS Coordinator.  Information including medical coding, functional ability and quality indicators will be reviewed and updated through discharge.    

## 2018-07-02 NOTE — Progress Notes (Signed)
Physical Therapy Session Note  Patient Details  Name: Mark Cabrera MRN: [id number] Date of Birth: [birth year]  Today's Date: 07/02/2018 PT Individual Time: 8472-0721 PT Individual Time Calculation (min): 21 min   Skilled Therapeutic Interventions/Progress Updates:    pt rec'd in chair, c/o fatigue but no c/o pain.  Session focused on gait training without AD with pt able to perform 150' x 2, 100' x 2 with initial mod A, fading to min A with repetition. Pt with LE and mild trunkal ataxia, improves with proprioceptive input and tactile cues during gait. Pt left in chair with alarm set, needs at hand.  Therapy Documentation PPain: Pain Assessment Pain Scale: 0-10 Pain Score: 0-No pain    Therapy/Group: Individual Therapy  [name] 07/02/2018, 12:14 PM

## 2018-07-02 NOTE — Progress Notes (Signed)
[facility] PHYSICAL MEDICINE & REHABILITATION PROGRESS NOTE   Subjective/Complaints: Had a fair night. Left shoulder still sore, back sore, too  ROS: Patient denies fever, rash, sore throat, blurred vision, nausea, vomiting, diarrhea, cough, shortness of breath or chest pain,  headache, or mood change.    Objective:   No results found. Recent Labs    06/30/18 0517 07/02/18 0617  WBC 3.0* 3.3*  HGB 12.1* 12.2*  HCT 35.7* 36.6*  PLT 163 165   Recent Labs    06/30/18 0517 07/02/18 0617  NA 137 137  K 3.6 4.3  CL 104 105  CO2 24 23  GLUCOSE 193* 172*  BUN 13 14  CREATININE 0.84 0.96  CALCIUM 8.9 9.0    Intake/Output Summary (Last 24 hours) at 07/02/2018 0837 Last data filed at 07/01/2018 2037 Gross per 24 hour  Intake 120 ml  Output -  Net 120 ml     Physical Exam: Vital Signs Blood pressure (!) 144/84, pulse 66, temperature 98 F (36.7 C), temperature source Oral, resp. rate 16, height 5\' 8"  (1.727 m), weight 93.2 kg, SpO2 99 %. Constitutional: No distress . Vital signs reviewed. HEENT: EOMI, oral membranes moist Neck: supple Cardiovascular: RRR with murmur. No JVD    Respiratory: CTA Bilaterally without wheezes or rales. Normal effort    GI: BS +, non-tender, non-distended  Musculoskeletal:Normal range of motion.  General: left shoulder tender with abduction/ER/IR  -Lb mildly tender Neurological: Lacks far left lateral vision. Follows commands.Left hemifacial sensory loss. Also with hemisensory loss LUE and LLE. Decreased FMC LUE and LE. Motor 4+/5 RUE and RLE, 4 to 4+ LUE and LLE prox to distal. DTR's 2+--motor and sensory stable today Skin: Skin iswarmand dry.  Psychiatric: pleasant and cooperative    Assessment/Plan: 1. Functional deficits secondary to right temporo-occipital infarct which require 3+ hours per day of interdisciplinary therapy in a comprehensive inpatient rehab setting.  Physiatrist is providing close team supervision and  24 hour management of active medical problems listed below.  Physiatrist and rehab team continue to assess barriers to discharge/monitor patient progress toward functional and medical goals  Care Tool:  Bathing              Bathing assist       Upper Body Dressing/Undressing Upper body dressing   What is the patient wearing?: Pull over shirt    Upper body assist Assist Level: Supervision/Verbal cueing    Lower Body Dressing/Undressing Lower body dressing      What is the patient wearing?: Pants     Lower body assist Assist for lower body dressing: Supervision/Verbal cueing     Toileting Toileting    Toileting assist Assist for toileting: Contact Guard/Touching assist     Transfers Chair/bed transfer  Transfers assist     Chair/bed transfer assist level: Supervision/Verbal cueing     Locomotion Ambulation   Ambulation assist              Walk 10 feet activity   Assist           Walk 50 feet activity   Assist           Walk 150 feet activity   Assist           Walk 10 feet on uneven surface  activity   Assist           Wheelchair     Assist  Wheelchair 50 feet with 2 turns activity    Assist            Wheelchair 150 feet activity     Assist          Medical Problem List and Plan: 1.Left side weaknesssecondary to small acute infarct mesial right temporal lobe and occipital lobe most likely due to atrial fibrillation Patient is beginning CIR therapies today including PT and OT   -team conference today 2. Antithrombotics: -DVT/anticoagulation:ELIQUIS -antiplatelet therapy: Aspirin 81 mg daily  -cbc reviewed/stable 3. Pain Management:Neurontin 100 mg 3 times a day  -kpad and sports rub for left shoulder, low back  -ROM exercises with PT/OT 4. Mood:Provide emotional support -antipsychotic agents: N/A 5. Neuropsych: This  patientiscapable of making decisions on hisown behalf. 6. Skin/Wound Care:Routine skin checks 7. Fluids/Electrolytes/Nutrition:encourage PO  -I personally reviewed the patient's labs today.   8.CAD/CABG. Continue aspirin. No chest pain or shortness of breath 9. History of atrial fibrillation. Continue Eliquis.Cardiac rate controlledon exam today 10. Diabetes mellitus with peripheral neuropathy. Hemoglobin A1c 12.Lantus insulin 18 units daily. NovoLog 12 units 3 times a day.  -reasonable control at present -monitor CBG's -adjust regimen as necessary 11. Permissive hypertension. Monitor with increased mobility. Patient on Coreg 3.125 mg twice a day,Lasix 40 mg a.m. 2 tablets every p.m.    -bp in range at present 12. Hyperlipidemia. Lipitor    LOS: 1 days A FACE TO FACE EVALUATION WAS PERFORMED  Mark Cabrera 07/02/2018, 8:37 AM

## 2018-07-02 NOTE — Evaluation (Signed)
Occupational Therapy Assessment and Plan  Patient Details  Name: Mark Cabrera MRN: [id number] Date of Birth: 10/08/1950  OT Diagnosis: ataxia, hemiplegia affecting non-dominant side and muscle weakness (generalized) Rehab Potential: Rehab Potential (ACUTE ONLY): Excellent ELOS: 5-7 days   Today's Date: 07/02/2018 OT Individual Time: 9326-7124 OT Individual Time Calculation (min): 60 min     Problem List:  Patient Active Problem List   Diagnosis Date Noted  . Occipital cerebral infarction ([place]) 07/01/2018  . CVA (cerebral vascular accident) ([place]) 06/28/2018  . Hepatic cirrhosis ([place]) 06/29/2017  . Candida albicans infection 01/09/2017  . Healthcare maintenance 12/16/2016  . Lipoma of left upper extremity 09/12/2016  . Chronic knee pain 07/04/2016  . Decreased visual acuity 07/04/2016  . Peripheral neuropathic pain 05/12/2016  . Pericardial effusion a. subxiphoid pericardial window on 04/21/2016 04/28/2016  . Pleural effusion on left, s/p throacentesis 04/18/16 04/28/2016  . 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  . CAD (coronary artery disease), native coronary artery   . Acute diastolic CHF (congestive heart failure) ([place])   . Sleep apnea 08/02/2009  . Diabetes mellitus with neurological manifestation ([place]) 10/18/2007  . Dyslipidemia 05/18/2006  . Hypertensive heart disease   . History of dissection of vertebral artery  ([place]) 08/02/2005    Past Medical History:  Past Medical History:  Diagnosis Date  . Acute diastolic congestive heart failure ([place])   . Aortic valve stenosis s/p AVR 2018  . Atrial fibrillation ([place]) - post-op CABG    04/2016 CHA2DS2VAS score = 5  . Atypical nevi   . Coronary artery disease s/p 2 vessel CABG   . Depression    "years ago"  . Diabetes mellitus   . Dyspnea    in the past   . GERD (gastroesophageal reflux disease)   . Heart murmur   . Hepatic cirrhosis ([place]) 06/29/2017  . History of kidney  stones   . Hyperlipidemia    hx of transaminitis secondary to statin and he has decided not to use statins secondary to potential side effects.  . Hypertension   . Nephrolithiasis   . Osteoarthritis, knee   . Sleep apnea     Central apnea. Not using cpap  . Stroke [facility]) 2007  . Transaminitis     Statin-induced  . 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 2-D echo was normal no embolic source found.   Past Surgical History:  Past Surgical History:  Procedure Laterality Date  . AORTIC VALVE REPLACEMENT N/A 03/30/2016   Procedure: AORTIC VALVE REPLACEMENT (AVR);  Surgeon: [name], MD;  Location: [place];  Service: Open Heart Surgery;  Laterality: N/A;  . CARDIAC CATHETERIZATION N/A 03/15/2016   Procedure: Right/Left Heart Cath and Coronary Angiography;  Surgeon: Burnell Blanks, MD;  Location: [place] CV [facility];  Service: Cardiovascular;  Laterality: N/A;  . CLIPPING OF ATRIAL APPENDAGE N/A 03/30/2016   Procedure: CLIPPING OF LEFT ATRIAL APPENDAGE;  Surgeon: [name], MD;  Location: [place];  Service: Open Heart Surgery;  Laterality: N/A;  . CORONARY ARTERY BYPASS GRAFT N/A 03/30/2016   Procedure: CORONARY ARTERY BYPASS GRAFTING (CABG) Times Two;  Surgeon: [name], MD;  Location: [place];  Service: Open Heart Surgery;  Laterality: N/A;  . KNEE ARTHROSCOPY W/ PARTIAL MEDIAL MENISCECTOMY  05/12/2005   right, performed by Dr. French Ana for torn medial meniscus.  Marland Kitchen ROTATOR CUFF REPAIR Right 2016  . STERNAL WIRES REMOVAL N/A 08/13/2017   Procedure: STERNAL WIRES REMOVAL;  Surgeon: [name], MD;  Location: [place];  Service: Thoracic;  Laterality: N/A;  . SUBXYPHOID PERICARDIAL  WINDOW N/A 04/21/2016   Procedure: SUBXYPHOID PERICARDIAL WINDOW;  Surgeon: [name], MD;  Location: [place];  Service: Thoracic;  Laterality: N/A;  . TEE WITHOUT CARDIOVERSION N/A 03/30/2016   Procedure: TRANSESOPHAGEAL ECHOCARDIOGRAM (TEE);  Surgeon: [name], MD;  Location: [place];  Service: Open Heart Surgery;  Laterality: N/A;  . TEE WITHOUT CARDIOVERSION N/A 04/21/2016   Procedure: TRANSESOPHAGEAL ECHOCARDIOGRAM (TEE);  Surgeon: [name], MD;  Location: [place];  Service: Thoracic;  Laterality: N/A;    Assessment & Plan Clinical Impression: Mark Cabrera is a 68 year old right-handed male with history of diabetes mellitus maintained on Lantus insulin, CAD with CABG/AVR with postoperative atrial fibrillation,vertebral artery dissection, 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 Per chart review [place] and has a room off the back of a barn. He heats his room with a wood stove and independent PTA.Presented 06/28/2018 with left-sided weakness and tingling. CT scan negative. Patient did not receive TPA. CT angiogram of head and neck occlusion proximal right P2 segment.echocardiogram with ejection fraction of 50-55% severely dilated left atrium. MRI showed small acute infarct in the mesial right temporal occipital lobe as well as chronic left basal ganglia lacunar infarction. Neurology consulted presently maintained on aspirin as well as Eliquis. Tolerating a regular diet. Therapy evaluations completed with recommendations of physical medicine rehabilitation consult. Patient was admitted for operative rehabilitation program.  Patient transferred to CIR on 07/01/2018 .    Patient currently requires min with basic self-care skills secondary to muscle weakness, decreased cardiorespiratoy endurance, ataxia and decreased coordination and decreased standing balance, decreased postural control, hemiplegia and decreased balance  strategies.  Prior to hospitalization, patient could complete ADLs/IADLs with independent .  Patient will benefit from skilled intervention to decrease level of assist with basic self-care skills and increase independence with basic self-care skills prior to discharge home with care partner.  Anticipate patient will require intermittent supervision and follow up outpatient.  OT - End of Session Activity Tolerance: Endurance does not limit participation in activity Endurance Deficit: No OT Assessment Rehab Potential (ACUTE ONLY): Excellent OT Patient demonstrates impairments in the following area(s): Balance;Behavior;Safety;Endurance;Sensory;Motor;Vision OT Basic ADL's Functional Problem(s): Grooming;Bathing;Dressing;Toileting OT Transfers Functional Problem(s): Toilet;Tub/Shower OT Additional Impairment(s): Fuctional Use of Upper Extremity OT Plan OT Intensity: Minimum of 1-2 x/day, 45 to 90 minutes OT Frequency: 5 out of 7 days OT Duration/Estimated Length of Stay: 5-7 days OT Treatment/Interventions: Balance/vestibular training;Community reintegration;Disease mangement/prevention;Functional electrical stimulation;Neuromuscular re-education;Patient/family education;Self Care/advanced ADL retraining;Splinting/orthotics;Therapeutic Exercise;UE/LE Coordination activities;Visual/perceptual remediation/compensation;UE/LE Strength taining/ROM;Therapeutic Activities;Psychosocial support;Pain management;Functional mobility training;DME/adaptive equipment instruction;Discharge planning OT Self Feeding Anticipated Outcome(s): Indep OT Basic Self-Care Anticipated Outcome(s): Mod I OT Toileting Anticipated Outcome(s): Mod I OT Bathroom Transfers Anticipated Outcome(s): Mod I toilet, supervision shower OT Recommendation Recommendations for Other Services: Neuropsych consult Patient destination: Home Follow Up Recommendations: Outpatient OT Equipment Recommended: To be determined   Skilled  Therapeutic Intervention Pt seen for OT eval anfd ADL bathing/dressing session. Pt asleep in supine upon arrival, easily awoken and agreeable to tx session, denying pain.  He ambulated  throughout session with RW and CGA, VCs for safety throughout 2/2 impulsivity. He completed functional transfers throughout session with CGA. He bathed seated on tub bench in shower with steadying assist when standing to complete pericare/buttcok hygiene. VCs for safety awareness when donning/doffing clothing as pt attempting to thread and unthread pants from standing position. UB bathing/dressing completed with set-up/supervision and LB bathing/dressing completed with steadying assist during standing portions. Pt self initating use of L UE in functional tasks stating "I know I need to use it in order for it to get better". Pt with ataxia and decreased strength/control In L UE impacting functional use of extremity.  Grooming tasks completed standing at sink with steadying assist.  Pt left seated in recliner at end of session, chair pad alarm on and all needs in reach. Educated regarding use of call bell and need for assist with any mobility needs, pt voiced understanding. Education provided throughout session regarding role of OT, POC, OT goals, decreased sensation in L UE/LE and functional implications and d/c planning.   OT Evaluation Precautions/Restrictions  Precautions Precautions: Fall Precaution Comments: Prior R side deficits from prior CVA Restrictions Weight Bearing Restrictions: No General Chart Reviewed: Yes Additional Pertinent History: hx CVA with residual R weakness Home Living/Prior [place] expects to be discharged to:: Private residence Living Arrangements: Alone Available Help at Discharge: Family, Available 24 hours/day Type of Home: House(Plans to d/c to sister's home before transitioning back home to barn in which he lives) Home Access: Stairs to  enter Technical brewer of Steps: 1 Entrance Stairs-Rails: None Home Layout: One level Bathroom Shower/Tub: (Sister's home as tub/shower combination) Additional Comments: Plans to d/c to sister's home as he lives in barn "off the grid" without electricity or running water  Lives With: Alone IADL History Current License: No Occupation: Retired Type of Occupation: Set designer" Prior Function Level of Independence: Independent with gait, Independent with transfers, Independent with homemaking with ambulation, Independent with basic ADLs  Able to Take Stairs?: Yes Driving: No Vision Baseline Vision/History: Wears glasses Wears Glasses: Distance only Patient Visual Report: No change from baseline Vision Assessment?: Yes Visual Fields: Left homonymous hemianopsia Perception  Perception: Impaired(Mild L inattention) Praxis Praxis: Intact Cognition Overall Cognitive Status: No family/caregiver present to determine baseline cognitive functioning Arousal/Alertness: Awake/alert Orientation Level: Person;Place;Situation Person: Oriented Place: Oriented Situation: Oriented Year: 2020 Month: April Day of Week: Incorrect Memory: Appears intact Immediate Memory Recall: Sock;Blue;Bed Memory Recall: Sock;Blue;Bed Memory Recall Sock: Without Cue Memory Recall Blue: Without Cue Memory Recall Bed: Without Cue Attention: Focused Focused Attention: Appears intact Awareness: Appears intact Problem Solving: Appears intact Behaviors: Impulsive Safety/Judgment: Impaired Comments: Poor safety awareness with decreased awareness of deficits and functional implications Sensation Sensation Light Touch: Impaired Detail Light Touch Impaired Details: Impaired LUE;Impaired RUE;Impaired RLE;Impaired LLE Hot/Cold: Impaired Detail Hot/Cold Impaired Details: Impaired RUE;Impaired RLE Proprioception: Appears Intact Coordination Gross Motor Movements are Fluid and Coordinated: No Fine Motor  Movements are Fluid and Coordinated: No Coordination and Movement Description: 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 Finger Nose Finger Test: Decreased speed and accuracy L UE; WFL R UE Motor  Motor Motor: Hemiplegia;Ataxia Motor - Skilled Clinical Observations: L hemi Mobility  Bed Mobility Bed Mobility: Rolling Right;Rolling Left;Supine to Sit;Sit to Supine Rolling Right: Supervision/verbal cueing Rolling Left: Supervision/Verbal cueing Supine to Sit: Supervision/Verbal cueing Sit to Supine: Supervision/Verbal cueing Transfers Sit to Stand: Contact Guard/Touching assist  Trunk/Postural Assessment  Cervical Assessment Cervical Assessment: Exceptions to WFL(Forward head) Thoracic Assessment Thoracic Assessment: Exceptions  to WFL(Rounded shoulders; Kyphotic) Lumbar Assessment Lumbar Assessment: Exceptions to WFL(Posterior pelvic tilt) Postural Control Postural Control: Deficits on evaluation(Impaired 2/2 trunkal ataxia) Righting Reactions: delayed  Balance Balance Balance Assessed: Yes Standardized Balance Assessment Standardized Balance Assessment: Berg Balance Test Berg Balance Test Sit to Stand: Able to stand  independently using hands Standing Unsupported: Able to stand 2 minutes with supervision Sitting with Back Unsupported but Feet Supported on Floor or Stool: Able to sit safely and securely 2 minutes Stand to Sit: Sits safely with minimal use of hands Transfers: Able to transfer with verbal cueing and /or supervision Standing Unsupported with Eyes Closed: Able to stand 10 seconds with supervision Standing Ubsupported with Feet Together: Able to place feet together independently and stand for 1 minute with supervision From Standing, Reach Forward with Outstretched Arm: Can reach forward >12 cm safely (5") From Standing Position, Pick up Object from Floor: Able to pick up shoe, needs supervision From Standing Position, Turn to Look Behind Over each Shoulder: Looks behind  from both sides and weight shifts well Turn 360 Degrees: Needs close supervision or verbal cueing Standing Unsupported, Alternately Place Feet on Step/Stool: Able to complete >2 steps/needs minimal assist Standing Unsupported, One Foot in Front: Able to plae foot ahead of the other independently and hold 30 seconds Standing on One Leg: Able to lift leg independently and hold equal to or more than 3 seconds Total Score: 39 Static Sitting Balance Static Sitting - Balance Support: No upper extremity supported;Feet supported Static Sitting - Level of Assistance: 6: Modified independent (Device/Increase time);5: Stand by assistance Dynamic Sitting Balance Dynamic Sitting - Balance Support: No upper extremity supported;During functional activity;Feet supported Dynamic Sitting - Level of Assistance: 5: Stand by assistance;4: Min assist Sitting balance - Comments: Sitting to complete bathing/dressing task Static Standing Balance Static Standing - Balance Support: During functional activity;No upper extremity supported Static Standing - Level of Assistance: 4: Min assist Dynamic Standing Balance Dynamic Standing - Balance Support: During functional activity;No upper extremity supported Dynamic Standing - Level of Assistance: 4: Min assist Dynamic Standing - Comments: Standing to complete LB clothing management Extremity/Trunk Assessment RUE Assessment RUE Assessment: Within Functional Limits Active Range of Motion (AROM) Comments: 4+/5 throughout LUE Assessment LUE Assessment: Exceptions to [facility] Active Range of Motion (AROM) Comments: ~100 degrees shoulder flexion 2/2 rotator cuff injury. AROM and PROM with pain past ~100 degrees General Strength Comments: 4/5 throughout LUE Body System: Neuro LUE AROM (degrees) Overall AROM Left Upper Extremity: Within functional limits for tasks assessed LUE Strength LUE Overall Strength: Deficits     Refer to Care Plan for Long Term  Goals  Recommendations for other services: Neuropsych and Therapeutic Recreation  Stress management and Outing/community reintegration   Discharge Criteria: Patient will be discharged from OT if patient refuses treatment 3 consecutive times without medical reason, if treatment goals not met, if there is a change in medical status, if patient makes no progress towards goals or if patient is discharged from hospital.  The above assessment, treatment plan, treatment alternatives and goals were discussed and mutually agreed upon: by patient  Burris Matherne L 07/02/2018, 3:29 PM

## 2018-07-02 NOTE — Progress Notes (Signed)
Patient slept throughout the night. Given prn tylenol 650 mg po at 0533 for complaints of lower back pain-resting upon reassessment.

## 2018-07-02 NOTE — Progress Notes (Signed)
Physical Therapy Session Note  Patient Details  Name: Mark Cabrera MRN: [id number] Date of Birth: 1950/03/23  Today's Date: 07/02/2018 PT Individual Time: 1330-1445 PT Individual Time Calculation (min): 75 min   Short Term Goals: Week 1:  PT Short Term Goal 1 (Week 1): =LTG due to ELOS  Skilled Therapeutic Interventions/Progress Updates:  Patient seated in recliner in room.  Reports history of present illness.  Agreeable to PT.  Sit to stand with S and ambulated with RW and CGA to therapy gym.  Completed Berg balance assessment as noted below.  Patient reports history of falls prior to this stroke as sequelae from previous stroke with "equilibrium" issues.  Discussed fall risk and issues if on blood thinner and pt reports was not on prior to this stroke.  Patient relates fall x 1 near his wood stove at home and how he handled it without getting burned or injured.  Reports did modify the space near the wood stove to decrease fall risk.  Patient ambulated over compliant surface with min A no device, pt performing with L vs R for step up and off mat.  Patient ambulated with RW and CGA to ortho gym.  Performed ramp with min A no device and over mulch and curb with min A.  Patient demonstrates decreased L LE coordination.  Patient discussed treatment in the past for "vertigo".  Gait back to room with RW and CGA to S.  Performed modified hall pike to test for BPPV, but noted no increased symptoms or nystagmus.  Patient left in supine with call bell in reach and bed alarm on.  Therapy Documentation Precautions:  Precautions Precautions: Fall Precaution Comments: Prior R side deficits from prior CVA Restrictions Weight Bearing Restrictions: No Pain:  Denies pain Balance: Balance Balance Assessed: Yes Standardized Balance Assessment Standardized Balance Assessment: Berg Balance Test Berg Balance Test Sit to Stand: Able to stand  independently using hands Standing Unsupported: Able to stand 2  minutes with supervision Sitting with Back Unsupported but Feet Supported on Floor or Stool: Able to sit safely and securely 2 minutes Stand to Sit: Sits safely with minimal use of hands Transfers: Able to transfer with verbal cueing and /or supervision Standing Unsupported with Eyes Closed: Able to stand 10 seconds with supervision Standing Ubsupported with Feet Together: Able to place feet together independently and stand for 1 minute with supervision From Standing, Reach Forward with Outstretched Arm: Can reach forward >12 cm safely (5") From Standing Position, Pick up Object from Floor: Able to pick up shoe, needs supervision From Standing Position, Turn to Look Behind Over each Shoulder: Looks behind from both sides and weight shifts well Turn 360 Degrees: Needs close supervision or verbal cueing Standing Unsupported, Alternately Place Feet on Step/Stool: Able to complete >2 steps/needs minimal assist Standing Unsupported, One Foot in Front: Able to plae foot ahead of the other independently and hold 30 seconds Standing on One Leg: Able to lift leg independently and hold equal to or more than 3 seconds Total Score: 39     Therapy/Group: Individual Therapy  Reginia Naas  Magda Kiel, PT 07/02/2018, 5:13 PM

## 2018-07-02 NOTE — Evaluation (Signed)
Physical Therapy Assessment and Plan  Patient Details  Name: Mark Cabrera MRN: [id number] Date of Birth: 01/12/1951  PT Diagnosis: Abnormality of gait, Ataxia, Ataxic gait, Cognitive deficits, Coordination disorder, Difficulty walking, Hemiparesis non-dominant, Impaired cognition, Impaired sensation and Muscle weakness Rehab Potential: Good ELOS: 5-7 days   Today's Date: 07/02/2018 PT Individual Time: 1030-1130 PT Individual Time Calculation (min): 60 min    Problem List:  Patient Active Problem List   Diagnosis Date Noted  . Occipital cerebral infarction ([place]) 07/01/2018  . CVA (cerebral vascular accident) ([place]) 06/28/2018  . Hepatic cirrhosis ([place]) 06/29/2017  . Candida albicans infection 01/09/2017  . Healthcare maintenance 12/16/2016  . Lipoma of left upper extremity 09/12/2016  . Chronic knee pain 07/04/2016  . Decreased visual acuity 07/04/2016  . Peripheral neuropathic pain 05/12/2016  . Pericardial effusion a. subxiphoid pericardial window on 04/21/2016 04/28/2016  . Pleural effusion on left, s/p throacentesis 04/18/16 04/28/2016  . 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  . CAD (coronary artery disease), native coronary artery   . Acute diastolic CHF (congestive heart failure) ([place])   . Sleep apnea 08/02/2009  . Diabetes mellitus with neurological manifestation ([place]) 10/18/2007  . Dyslipidemia 05/18/2006  . Hypertensive heart disease   . History of dissection of vertebral artery  ([place]) 08/02/2005    Past Medical History:  Past Medical History:  Diagnosis Date  . Acute diastolic congestive heart failure ([place])   . Aortic valve stenosis s/p AVR 2018  . Atrial fibrillation ([place]) - post-op CABG    04/2016 CHA2DS2VAS score = 5  . Atypical nevi   . Coronary artery disease s/p 2 vessel CABG   . Depression    "years ago"  . Diabetes mellitus   . Dyspnea    in the past   . GERD (gastroesophageal reflux disease)   . Heart  murmur   . Hepatic cirrhosis ([place]) 06/29/2017  . History of kidney stones   . Hyperlipidemia    hx of transaminitis secondary to statin and he has decided not to use statins secondary to potential side effects.  . Hypertension   . Nephrolithiasis   . Osteoarthritis, knee   . Sleep apnea     Central apnea. Not using cpap  . Stroke [facility]) 2007  . Transaminitis     Statin-induced  . 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 2-D echo was normal no embolic source found.   Past Surgical History:  Past Surgical History:  Procedure Laterality Date  . AORTIC VALVE REPLACEMENT N/A 03/30/2016   Procedure: AORTIC VALVE REPLACEMENT (AVR);  Surgeon: [name], MD;  Location: [place];  Service: Open Heart Surgery;  Laterality: N/A;  . CARDIAC CATHETERIZATION N/A 03/15/2016   Procedure: Right/Left Heart Cath and Coronary Angiography;  Surgeon: Burnell Blanks, MD;  Location: [place] CV LAB;  Service: Cardiovascular;  Laterality: N/A;  . CLIPPING OF ATRIAL APPENDAGE N/A 03/30/2016   Procedure: CLIPPING OF LEFT ATRIAL APPENDAGE;  Surgeon: [name], MD;  Location: [place];  Service: Open Heart Surgery;  Laterality: N/A;  . CORONARY ARTERY BYPASS GRAFT N/A 03/30/2016   Procedure: CORONARY ARTERY BYPASS GRAFTING (CABG) Times Two;  Surgeon: Remo Lipps  Chaya Jan, MD;  Location: [place];  Service: Open Heart Surgery;  Laterality: N/A;  . KNEE ARTHROSCOPY W/ PARTIAL MEDIAL MENISCECTOMY  05/12/2005   right, performed by Dr. French Ana for torn medial meniscus.  Marland Kitchen ROTATOR CUFF REPAIR Right 2016  . STERNAL WIRES REMOVAL N/A 08/13/2017   Procedure: STERNAL WIRES REMOVAL;  Surgeon: [name], MD;  Location: [place];  Service: Thoracic;  Laterality: N/A;  . SUBXYPHOID PERICARDIAL WINDOW N/A 04/21/2016   Procedure: SUBXYPHOID PERICARDIAL WINDOW;  Surgeon: [name], MD;  Location: [place];  Service: Thoracic;  Laterality: N/A;  . TEE WITHOUT CARDIOVERSION N/A 03/30/2016   Procedure: TRANSESOPHAGEAL ECHOCARDIOGRAM (TEE);  Surgeon: [name], MD;  Location: [place];  Service: Open Heart Surgery;  Laterality: N/A;  . TEE WITHOUT CARDIOVERSION N/A 04/21/2016   Procedure: TRANSESOPHAGEAL ECHOCARDIOGRAM (TEE);  Surgeon: [name], MD;  Location: [place];  Service: Thoracic;  Laterality: N/A;    Assessment & Plan Clinical Impression:  Mark Cabrera is a 68 year old right-handed male with history of diabetes mellitus maintained on Lantus insulin, CAD with CABG/AVR with postoperative atrial fibrillation,vertebral artery dissection, 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 Per chart review [place] and has a room off the back of a barn. He heats his room with a wood stove and independent PTA.Presented 06/28/2018 with left-sided weakness and tingling. CT scan negative. Patient did not receive TPA. CT angiogram of head and neck occlusion proximal right P2 segment.echocardiogram with ejection fraction of 50-55% severely dilated left atrium. MRI showed small acute infarct in the mesial right temporal occipital lobe as well as chronic left basal ganglia lacunar infarction. Neurology consulted presently maintained on aspirin as well as Eliquis. Tolerating a regular diet. Therapy evaluations completed with recommendations of physical medicine rehabilitation consult. Patient was admitted for operative rehabilitation program. Patient transferred to CIR on 07/01/2018 .   Patient currently requires min with mobility secondary to muscle weakness, ataxia and decreased coordination, decreased safety awareness and decreased sitting balance, decreased  standing balance, decreased postural control, hemiplegia and decreased balance strategies.  Prior to hospitalization, patient was independent  with mobility and lived with Alone in a Other(Comment)(lives in a barn) home.  Home access is 1Stairs to enter.  Patient will benefit from skilled PT intervention to maximize safe functional mobility, minimize fall risk and decrease caregiver burden for planned discharge home alone.  Anticipate patient will benefit from follow up [place] at discharge.  PT - End of Session Activity Tolerance: Tolerates 30+ min activity without fatigue Endurance Deficit: No PT Assessment Rehab Potential (ACUTE/IP ONLY): Good PT Barriers to Discharge: Decreased caregiver support;Medical stability;Home environment access/layout PT Patient demonstrates impairments in the following area(s): Balance;Motor;Perception;Safety;Sensory PT Transfers Functional Problem(s): Bed Mobility;Bed to Chair;Car;Furniture;Floor PT Locomotion Functional Problem(s): Ambulation;Wheelchair Mobility;Stairs PT Plan PT Intensity: Minimum of 1-2 x/day ,45 to 90 minutes PT Frequency: 5 out of 7 days PT Duration Estimated Length of Stay: 5-7 days PT Treatment/Interventions: Ambulation/gait training;Balance/vestibular training;Cognitive remediation/compensation;Community reintegration;Discharge planning;Disease management/prevention;DME/adaptive equipment instruction;Functional mobility training;Neuromuscular re-education;Pain management;Patient/family education;Psychosocial support;Splinting/orthotics;Stair training;Therapeutic Activities;Therapeutic Exercise;UE/LE Strength taining/ROM;UE/LE Coordination activities;Visual/perceptual remediation/compensation PT Transfers Anticipated Outcome(s): mod I PT Locomotion Anticipated Outcome(s): mod I with LRAD PT Recommendation Recommendations for Other Services: Therapeutic Recreation consult Therapeutic Recreation Interventions: Stress management Follow Up  Recommendations: Home health PT Patient destination: Home Equipment Recommended: To be determined Equipment Details: TBD pending progress  Skilled Therapeutic Intervention Evaluation completed (see details above and below) with  education on PT POC and goals and individual treatment initiated with focus on functional transfer and gait assessment. Pt received seated in recliner in room, agreeable to PT session. No complaints of pain. Sit to stand with CGA to RW. Ambulation x 200 ft with RW and CGA, LLE ataxia and decreased LLE clearance with gait. Pt is impulsive and needs v/c for safe RW management and to pay attention to avoid obstacles and for balance when ambulating. Ascend/descend 4 stairs with 2 handrails and min A, step-to gait pattern. Car transfer with CGA, pt demos good safety and understanding of safe car transfer method. Pt exhibits decreased safety awareness and decreased attention to therapy tasks throughout session, requires v/c for safety and focus on therapy tasks. Pt left seated in recliner in room with needs in reach, chair alarm in place at end of session.  PT Evaluation Precautions/Restrictions Precautions Precautions: Fall Precaution Comments: Prior R side deficits from last stroke Restrictions Weight Bearing Restrictions: No Home Living/Prior Functioning Home Living Available Help at Discharge: Family;Available 24 hours/day Type of Home: Other(Comment)(lives in a barn) Home Access: Stairs to enter Entrance Stairs-Number of Steps: 1 Entrance Stairs-Rails: None Home Layout: One level  Lives With: Alone Prior Function Level of Independence: Independent with gait;Independent with transfers  Able to Take Stairs?: Yes Driving: No Vision/Perception  Vision - History Baseline Vision: Other (comment)(reports impaired depth perception since previous CVA) Perception Perception: Impaired(decreased attention to L visual field at times) Praxis Praxis: Intact   Cognition Overall Cognitive Status: Impaired/Different from baseline Arousal/Alertness: Awake/alert Orientation Level: Oriented X4 Attention: Focused Focused Attention: Appears intact Memory: Appears intact Awareness: Appears intact Problem Solving: Appears intact Behaviors: Impulsive Safety/Judgment: Impaired Sensation Sensation Light Touch: Impaired Detail Light Touch Impaired Details: Impaired LUE;Impaired RUE;Impaired RLE;Impaired LLE Hot/Cold: Impaired Detail Hot/Cold Impaired Details: Impaired RUE;Impaired RLE Proprioception: Appears Intact Coordination Gross Motor Movements are Fluid and Coordinated: No Fine Motor Movements are Fluid and Coordinated: No Coordination and Movement Description: impaired by L hemi Motor  Motor Motor: Hemiplegia Motor - Skilled Clinical Observations: L hemi  Mobility Bed Mobility Bed Mobility: Rolling Right;Rolling Left;Supine to Sit;Sit to Supine Rolling Right: Supervision/verbal cueing Rolling Left: Supervision/Verbal cueing Supine to Sit: Supervision/Verbal cueing Sit to Supine: Supervision/Verbal cueing Transfers Transfers: Sit to Stand;Stand Pivot Transfers Sit to Stand: Contact Guard/Touching assist Stand Pivot Transfers: Contact Guard/Touching assist Transfer (Assistive device): Rolling walker Locomotion  Gait Gait Distance (Feet): 200 Feet Assistive device: Rolling walker Gait Gait Pattern: Impaired Gait velocity: Decreased Stairs / Additional Locomotion Stairs: Yes Stairs Assistance: Minimal Assistance - Patient > 75% Stair Management Technique: Two rails;Step to pattern Number of Stairs: 4 Height of Stairs: 6 Wheelchair Mobility Wheelchair Mobility: No  Trunk/Postural Assessment  Cervical Assessment Cervical Assessment: Exceptions to WFL(forward head) Thoracic Assessment Thoracic Assessment: Exceptions to WFL(rounded shoulders) Lumbar Assessment Lumbar Assessment: Exceptions to WFL(posterior pelvic  tilt) Postural Control Postural Control: Deficits on evaluation Righting Reactions: delayed  Balance Balance Balance Assessed: Yes Static Sitting Balance Static Sitting - Balance Support: No upper extremity supported;Feet supported Static Sitting - Level of Assistance: 5: Stand by assistance Dynamic Sitting Balance Dynamic Sitting - Balance Support: No upper extremity supported;During functional activity;Feet unsupported Dynamic Sitting - Level of Assistance: 4: Min Insurance risk surveyor Standing - Balance Support: Bilateral upper extremity supported;During functional activity Static Standing - Level of Assistance: 5: Stand by assistance Dynamic Standing Balance Dynamic Standing - Balance Support: Bilateral upper extremity supported;During functional activity Dynamic Standing - Level of Assistance: 4: Min assist  Extremity Assessment   RLE Assessment RLE Assessment: Within Functional Limits Passive Range of Motion (PROM) Comments: WFL General Strength Comments: 5/5 grossly LLE Assessment LLE Assessment: Exceptions to [facility] Passive Range of Motion (PROM) Comments: WFL General Strength Comments: impaired, see below LLE Strength Left Hip Flexion: 4/5 Left Knee Flexion: 3+/5 Left Knee Extension: 4/5 Left Ankle Dorsiflexion: 4/5    Refer to Care Plan for Long Term Goals  Recommendations for other services: Therapeutic Recreation  Stress management  Discharge Criteria: Patient will be discharged from PT if patient refuses treatment 3 consecutive times without medical reason, if treatment goals not met, if there is a change in medical status, if patient makes no progress towards goals or if patient is discharged from hospital.  The above assessment, treatment plan, treatment alternatives and goals were discussed and mutually agreed upon: by patient   Excell Seltzer, PT, DPT 07/02/2018, 1:25 PM

## 2018-07-02 NOTE — Progress Notes (Signed)
Pt discharging to CIR today. CM signing off. Please see note from [facility] for [place] coverage.

## 2018-07-02 NOTE — Progress Notes (Signed)
Patient complained of throat tightness, no swelling of tongue or throat noted no SOB, O2 sats 100 on RA, no signs of distress noted vss, PA pam love notified orders of benadryl 25mg , vitals sign q 15 for 1 hour,  Patient reassessed no complaints of throat tightness reported will continue to monitor report of the night nurse

## 2018-07-03 ENCOUNTER — Inpatient Hospital Stay (HOSPITAL_COMMUNITY): Payer: Medicare Other | Admitting: *Deleted

## 2018-07-03 ENCOUNTER — Inpatient Hospital Stay (HOSPITAL_COMMUNITY): Payer: Medicare Other

## 2018-07-03 ENCOUNTER — Inpatient Hospital Stay (HOSPITAL_COMMUNITY): Payer: Medicare Other | Admitting: Physical Therapy

## 2018-07-03 ENCOUNTER — Other Ambulatory Visit: Payer: Self-pay | Admitting: Pharmacist

## 2018-07-03 ENCOUNTER — Inpatient Hospital Stay (HOSPITAL_COMMUNITY): Payer: Medicare Other | Admitting: Occupational Therapy

## 2018-07-03 DIAGNOSIS — I2510 Atherosclerotic heart disease of native coronary artery without angina pectoris: Secondary | ICD-10-CM

## 2018-07-03 LAB — GLUCOSE, CAPILLARY
Glucose-Capillary: 138 mg/dL — ABNORMAL HIGH (ref 70–99)
Glucose-Capillary: 128 mg/dL — ABNORMAL HIGH (ref 70–99)
Glucose-Capillary: 79 mg/dL (ref 70–99)
Glucose-Capillary: 203 mg/dL — ABNORMAL HIGH (ref 70–99)

## 2018-07-03 MED ORDER — ATORVASTATIN CALCIUM 80 MG PO TABS
80.0000 mg | ORAL_TABLET | Freq: Every day | ORAL | Status: DC
  Administered 2018-07-05 – 2018-07-06 (×2): 80 mg via ORAL
  Filled 2018-07-03 (×3): qty 1

## 2018-07-03 MED ORDER — APIXABAN 5 MG PO TABS: 5.0000 mg | ORAL_TABLET | Freq: Two times a day (BID) | ORAL | 0 refills | Status: DC

## 2018-07-03 NOTE — Patient Care Conference (Signed)
Inpatient RehabilitationTeam Conference and Plan of Care Update Date: 07/02/2018   Time: 2:15 PM    Patient Name: Mark Cabrera      Medical Record Number: [id number]  Date of Birth: 12-03-1950 Sex: Male         Room/Bed: 4W11C/4W11C-01 Payor Info: Payor: MEDICARE / Plan: MEDICARE PART A AND B / Product Type: *No Product type* /    Admitting Diagnosis: cva  Admit Date/Time:  07/01/2018  3:01 PM Admission Comments: No comment available   Primary Diagnosis:  <principal problem not specified> Principal Problem: <principal problem not specified>  Patient Active Problem List   Diagnosis Date Noted  . Occipital cerebral infarction ([place]) 07/01/2018  . CVA (cerebral vascular accident) ([place]) 06/28/2018  . Hepatic cirrhosis ([place]) 06/29/2017  . Candida albicans infection 01/09/2017  . Healthcare maintenance 12/16/2016  . Lipoma of left upper extremity 09/12/2016  . Chronic knee pain 07/04/2016  . Decreased visual acuity 07/04/2016  . Peripheral neuropathic pain 05/12/2016  . Pericardial effusion a. subxiphoid pericardial window on 04/21/2016 04/28/2016  . Pleural effusion on left, s/p throacentesis 04/18/16 04/28/2016  . 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  . CAD (coronary artery disease), native coronary artery   . Acute diastolic CHF (congestive heart failure) ([place])   . Sleep apnea 08/02/2009  . Diabetes mellitus with neurological manifestation ([place]) 10/18/2007  . Dyslipidemia 05/18/2006  . Hypertensive heart disease   . History of dissection of vertebral artery  ([place]) 08/02/2005    Expected Discharge Date: Expected Discharge Date: 07/09/18  Team Members Present: Physician leading conference: Dr. Alger Simons Social Worker Present: Alfonse Alpers, LCSW Nurse Present: Other (comment)(Samantha Turner-Mims, LPN) PT Present: Other (comment)(Taylor Turkalo, LPN) OT Present: Amy Rounds, OT PPS Coordinator present : Gunnar Fusi   Current Status/Progress Goal Weekly Team Focus  Medical   right occipital and temporal infarcts with hemisensory loss on left. htn, cad, dm  improve functional use of LUE and LLE  pain control, dm, bp control   Bowel/Bladder   Continent of bowel and bladder LBM-07/01/18  Remain continent of B/B  Assist with toileting needs prn   Swallow/Nutrition/ Hydration             ADL's   Min A - CGA overall  Mod I overall; Supervision tub/shower transfers  ADL/IADL retraining; L neuro Re-Ed, safety awareness, and d/c planning   Mobility   CGA overall with transfers and gait with RW, min A with stairs  mod I  balance, gait, L NMR   Communication             Safety/Cognition/ Behavioral Observations            Pain   Denies pain  Remain pain free  Assess pain every shift and as needed   Skin   scabbed over abrasions noted to bilateral legs  no s/sx of infection  Assess skin every shift and as needed    Rehab Goals Patient on target to meet rehab goals: Yes Rehab Goals Revised: none *See Care Plan and progress notes for long and short-term goals.     Barriers to Discharge  Current Status/Progress Possible Resolutions Date Resolved   Physician    Medical stability        see medical progress notes      Nursing                  PT  Decreased caregiver support;Medical  stability;Home environment access/layout                 OT                  SLP                SW                Discharge Planning/Teaching Needs:  Pt plans to discharge to his sister's home initially while he continues to recover.  Pt will be independent to direct any care he needs, but his goals are overall Mod I.   Team Discussion:  Pt with embolic infarcts and hx of previous stroke.  Pt has some sensory loss.  His blood pressure is in under fair control.  He has CAD and diabetes, but otherwise is doing okay medically.  Pt c/o shoulder pain and Dr. Naaman Plummer ordered heat and scapular stabilization.  He is not  complaining of pain and his CBGs are normal.  Pt is min A/CGA with OT and PT with ataxia to left UE and LE.  Team to work on Retail banker.  He can walk with and without RW.  Mod I goals overall.   Revisions to Treatment Plan:  none    Continued Need for Acute Rehabilitation Level of Care: The patient requires daily medical management by a physician with specialized training in physical medicine and rehabilitation for the following conditions: Daily direction of a multidisciplinary physical rehabilitation program to ensure safe treatment while eliciting the highest outcome that is of practical value to the patient.: Yes Daily medical management of patient stability for increased activity during participation in an intensive rehabilitation regime.: Yes Daily analysis of laboratory values and/or radiology reports with any subsequent need for medication adjustment of medical intervention for : Pulmonary problems;Cardiac problems;Other   I attest that I was present, lead the team conference, and concur with the assessment and plan of the team.Team conference was held via web/ teleconference due to [place] - 19.   Tracey Stewart, Silvestre Mesi 07/03/2018, 11:15 AM

## 2018-07-03 NOTE — Progress Notes (Signed)
Occupational Therapy Session Note  Patient Details  Name: Mark Cabrera MRN: [id number] Date of Birth: 06/05/1950  Today's Date: 07/03/2018 OT Individual Time: 4098-1191 OT Individual Time Calculation (min): 55 min    Short Term Goals: Week 1:  OT Short Term Goal 1 (Week 1): STG=LTG due to LOS  Skilled Therapeutic Interventions/Progress Updates:    Pt supine in bed, no c/o pain. Pt donned pants sitting EOB with CGA for sit <> stand. Pt able to reach distally and don shoes, tieing both with (S). Pt used RW to complete functional mobility with CGA, 75 ft. Pt completed dynavision for 2 3 min intervals. 3.67 overall reaction time. Left upper quadrant slowest, with minimal cueing for head turning to compensate for self reported loss of peripheral vision, with 4.39 sec vs. Right upper quadrant 3.10 sec. Pt used RW to navigate out of small room and to dayroom, (S).  Pt completed 9 hole peg test seated at table with results as written below.  R: 34.84 sec L: 1 min 35 sec  Pt completed bimanual [place] task, stringing breads at midline to increase B integration. Pt used RW to return to room and was left sitting up in recliner. Chair pad alarm set and recreational therapist in room upon leaving.   Pt very verbose throughout session redirection to task several times.    Therapy Documentation Precautions:  Precautions Precautions: Fall Precaution Comments: Prior R side deficits from prior CVA Restrictions Weight Bearing Restrictions: No   Therapy/Group: Individual Therapy  Curtis Sites 07/03/2018, 9:14 AM

## 2018-07-03 NOTE — Progress Notes (Signed)
Eliquis free trial prescription sent to [facility] per internal medicine team request.

## 2018-07-03 NOTE — Progress Notes (Signed)
Physical Therapy Session Note  Patient Details  Name: LERON STOFFERS MRN: [id number] Date of Birth: Jan 06, 1951  Today's Date: 07/03/2018 PT Individual Time: 1000-1100 PT Individual Time Calculation (min): 60 min   Short Term Goals: Week 1:  PT Short Term Goal 1 (Week 1): =LTG due to ELOS  Skilled Therapeutic Interventions/Progress Updates:    Pt received seated in recliner in room, agreeable to PT session. No complaints of pain. Pt transfers with Supervision throughout session. Ambulation x 200 ft with min A, ongoing ataxia and decreased LLE clearance. Session focus on LLE NMR for improved coordination and control with gait. Standing alt L/R cone taps with min HHA for balance, 3# weight on LLE for improved proprioceptive input. Agility ladder forwards step-to and step-through with mod A to maintain balance. Pt exhibits poor ability to control LLE when attempting to step through agility ladder. Quad-cane step-overs with LLE onto target on the floor. Ambulation x 200 ft with min A following session focus on LLE coordination, pt exhibits fair carry-over into gait but exhibits improved LLE clearance with v/c to attend to LLE. Pt left seated in recliner in room with needs in reach, chair alarm in place.  Therapy Documentation Precautions:  Precautions Precautions: Fall Precaution Comments: Prior R side deficits from prior CVA Restrictions Weight Bearing Restrictions: No    Therapy/Group: Individual Therapy   Excell Seltzer, PT, DPT  07/03/2018, 12:46 PM

## 2018-07-03 NOTE — Progress Notes (Signed)
[facility] Individual Statement of Services  Patient Name:  Mark Cabrera  Date:  07/03/2018  Welcome to the [facility].  Our goal is to provide you with an individualized program based on your diagnosis and situation, designed to meet your specific needs.  With this comprehensive rehabilitation program, you will be expected to participate in at least 3 hours of rehabilitation therapies Monday-Friday, with modified therapy programming on the weekends.  Your rehabilitation program will include the following services:  Physical Therapy (PT), Occupational Therapy (OT), 24 hour per day rehabilitation nursing, Therapeutic Recreaction (TR), Case Management (Social Worker), Rehabilitation Medicine, Nutrition Services and Pharmacy Services  Weekly team conferences will be held on Tuesdays to discuss your progress.  Your Social Worker will talk with you frequently to get your input and to update you on team discussions.  Team conferences with you and your family in attendance may also be held.  Expected length of stay: 5 to 7 days  Overall anticipated outcome: Modified Independent with supervision for shower transfers  Depending on your progress and recovery, your program may change. Your Social Worker will coordinate services and will keep you informed of any changes. Your Social Worker's name and contact numbers are listed  below.  The following services may also be recommended but are not provided by the [facility] will be made to provide these services after discharge if needed.  Arrangements include referral to agencies that provide these services.  Your insurance has been verified to be:  Medicare and Medicaid Your primary doctor is:  Dr. Kalman Shan  Pertinent information will be shared with your doctor and your  insurance company.  Social Worker:  Alfonse Alpers, LCSW  [phone] or [phone]  Information discussed with and copy given to patient by: Trey Sailors, 07/03/2018, 3:45 PM

## 2018-07-03 NOTE — Progress Notes (Signed)
[facility] PHYSICAL MEDICINE & REHABILITATION PROGRESS NOTE   Subjective/Complaints: Felt that throat muscles tightened up and spasmed last night. Benadryl helped. No complaints this morning. Has not experienced before. Felt that it happened after taking evening meds  ROS: Patient denies fever, rash, sore throat, blurred vision, nausea, vomiting, diarrhea, cough, shortness of breath or chest pain, joint or back pain, headache, or mood change.    Objective:   No results found. Recent Labs    07/02/18 0617  WBC 3.3*  HGB 12.2*  HCT 36.6*  PLT 165   Recent Labs    07/02/18 0617  NA 137  K 4.3  CL 105  CO2 23  GLUCOSE 172*  BUN 14  CREATININE 0.96  CALCIUM 9.0    Intake/Output Summary (Last 24 hours) at 07/03/2018 0855 Last data filed at 07/03/2018 0700 Gross per 24 hour  Intake 630 ml  Output -  Net 630 ml     Physical Exam: Vital Signs Blood pressure (!) 157/74, pulse 60, temperature 98.3 F (36.8 C), temperature source Oral, resp. rate 18, height 5\' 8"  (1.727 m), weight 93.1 kg, SpO2 99 %. Constitutional: No distress . Vital signs reviewed. HEENT: EOMI, oral membranes moist. Voice clear Neck: supple Cardiovascular: RRR without murmur. No JVD    Respiratory: CTA Bilaterally without wheezes or rales. Normal effort    GI: BS +, non-tender, non-distended  Musculoskeletal:Normal range of motion.  General: left shoulder tender with abduction/ER/IR  -Lb mildly tender Neurological: Lacks far left lateral vision. Follows commands.Left hemifacial sensory loss. Also with hemisensory loss LUE and LLE. Decreased FMC LUE and LE. Motor 4+/5 RUE and RLE, 4 to 4+ LUE and LLE prox to distal. DTR's 2+--motor and sensory stable today Skin: Skin iswarmand dry.  Psychiatric: pleasant and cooperative    Assessment/Plan: 1. Functional deficits secondary to right temporo-occipital infarct which require 3+ hours per day of interdisciplinary therapy in a comprehensive  inpatient rehab setting.  Physiatrist is providing close team supervision and 24 hour management of active medical problems listed below.  Physiatrist and rehab team continue to assess barriers to discharge/monitor patient progress toward functional and medical goals  Care Tool:  Bathing    Body parts bathed by patient: Right arm, Left upper leg, Left arm, Right lower leg, Chest, Abdomen, Left lower leg, Face, Front perineal area, Buttocks, Right upper leg         Bathing assist Assist Level: Minimal Assistance - Patient > 75%     Upper Body Dressing/Undressing Upper body dressing   What is the patient wearing?: Pull over shirt    Upper body assist Assist Level: Supervision/Verbal cueing    Lower Body Dressing/Undressing Lower body dressing      What is the patient wearing?: Pants     Lower body assist Assist for lower body dressing: Minimal Assistance - Patient > 75%     Toileting Toileting    Toileting assist Assist for toileting: Minimal Assistance - Patient > 75%     Transfers Chair/bed transfer  Transfers assist     Chair/bed transfer assist level: Supervision/Verbal cueing     Locomotion Ambulation   Ambulation assist      Assist level: Minimal Assistance - Patient > 75% Assistive device: No Device Max distance: 100'   Walk 10 feet activity   Assist     Assist level: Minimal Assistance - Patient > 75% Assistive device: No Device   Walk 50 feet activity   Assist    Assist level:  Minimal Assistance - Patient > 75% Assistive device: No Device    Walk 150 feet activity   Assist    Assist level: Contact Guard/Touching assist Assistive device: Walker-rolling    Walk 10 feet on uneven surface  activity   Assist Walk 10 feet on uneven surfaces activity did not occur: Safety/medical concerns   Assist level: Minimal Assistance - Patient > 75% Assistive device: (no device)   Wheelchair     Assist Will patient use  wheelchair at discharge?: No             Wheelchair 50 feet with 2 turns activity    Assist            Wheelchair 150 feet activity     Assist          Medical Problem List and Plan: 1.Left side weaknesssecondary to small acute infarct mesial right temporal lobe and occipital lobe most likely due to atrial fibrillation Patient is beginning CIR therapies today including PT and OT     2. Antithrombotics: -DVT/anticoagulation:ELIQUIS -antiplatelet therapy: Aspirin 81 mg daily    3. Pain Management:Neurontin 100 mg 3 times a day  -kpad and sports rub for left shoulder, low back  -ROM exercises with PT/OT 4. Mood:Provide emotional support -antipsychotic agents: N/A 5. Neuropsych: This patientiscapable of making decisions on hisown behalf. 6. Skin/Wound Care:Routine skin checks 7. Fluids/Electrolytes/Nutrition:encourage PO  -I personally reviewed the patient's labs today.   8.CAD/CABG. Continue aspirin. No chest pain or shortness of breath 9. History of atrial fibrillation. Continue Eliquis.Cardiac rate controlledon exam today 10. Diabetes mellitus with peripheral neuropathy. Hemoglobin A1c 12.Lantus insulin 18 units daily. NovoLog 12 units 3 times a day.  -fair control at present -monitor CBG's -adjust regimen as necessary 11. Permissive hypertension. Monitor with increased mobility. Patient on Coreg 3.125 mg twice a day,Lasix 40 mg a.m. 2 tablets every p.m.    -bp in range 4/22 12. Hyperlipidemia. Lipitor 13. Throat tightness: unclear etiology. Responsive to benadryl.               -no new medications yesterday    ?anxiety component   -continue to follow    LOS: 2 days A FACE TO FACE EVALUATION WAS PERFORMED  Meredith Staggers 07/03/2018, 8:55 AM

## 2018-07-03 NOTE — Evaluation (Signed)
Recreational Therapy Assessment and Plan  Patient Details  Name: Mark Cabrera MRN: [id number] Date of Birth: [birth year] Today's Date: 07/03/2018  Rehab Potential:  Good ELOS:   5 days  Assessment  Problem List:      Patient Active Problem List   Diagnosis Date Noted  . Occipital cerebral infarction ([facility]) 07/01/2018  . CVA (cerebral vascular accident) ([facility]) 06/28/2018  . Hepatic cirrhosis ([place]) 06/29/2017  . Candida albicans infection 01/09/2017  . Healthcare maintenance 12/16/2016  . Lipoma of left upper extremity 09/12/2016  . Chronic knee pain 07/04/2016  . Decreased visual acuity 07/04/2016  . Peripheral neuropathic pain 05/12/2016  . Pericardial effusion a. subxiphoid pericardial window on 04/21/2016 04/28/2016  . Pleural effusion on left, s/p throacentesis 04/18/16 04/28/2016  . 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  . CAD (coronary artery disease), native coronary artery   . Acute diastolic CHF (congestive heart failure) ([place])   . Sleep apnea 08/02/2009  . Diabetes mellitus with neurological manifestation ([place]) 10/18/2007  . Dyslipidemia 05/18/2006  . Hypertensive heart disease   . History of dissection of vertebral artery  ([place]) 08/02/2005    Past Medical History:      Past Medical History:  Diagnosis Date  . Acute diastolic congestive heart failure ([place])   . Aortic valve stenosis s/p AVR 2018  . Atrial fibrillation ([place]) - post-op CABG    04/2016 CHA2DS2VAS score = 5  . Atypical nevi   . Coronary artery disease s/p 2 vessel CABG   . Depression    "years ago"  . Diabetes mellitus   . Dyspnea    in the past   . GERD (gastroesophageal reflux disease)   . Heart murmur   . Hepatic cirrhosis ([place]) 06/29/2017  . History of kidney stones   . Hyperlipidemia    hx of transaminitis secondary to statin and he has decided not to use statins secondary to potential side effects.  . Hypertension   .  Nephrolithiasis   . Osteoarthritis, knee   . Sleep apnea     Central apnea. Not using cpap  . Stroke [facility]) 2007  . Transaminitis     Statin-induced  . 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 2-D echo was normal no embolic source found.   Past Surgical History:       Past Surgical History:  Procedure Laterality Date  . AORTIC VALVE REPLACEMENT N/A 03/30/2016   Procedure: AORTIC VALVE REPLACEMENT (AVR);  Surgeon: [name], MD;  Location: [facility];  Service: Open Heart Surgery;  Laterality: N/A;  . CARDIAC CATHETERIZATION N/A 03/15/2016   Procedure: Right/Left Heart Cath and Coronary Angiography;  Surgeon: Burnell Blanks, MD;  Location: [facility];  Service: Cardiovascular;  Laterality: N/A;  . CLIPPING OF ATRIAL APPENDAGE N/A 03/30/2016   Procedure: CLIPPING OF LEFT ATRIAL APPENDAGE;  Surgeon: [name], MD;  Location: [facility];  Service: Open Heart Surgery;  Laterality: N/A;  . CORONARY ARTERY BYPASS GRAFT N/A 03/30/2016   Procedure: CORONARY ARTERY BYPASS GRAFTING (CABG) Times Two;  Surgeon: [name], MD;  Location: [facility];  Service: Open Heart Surgery;  Laterality: N/A;  . KNEE ARTHROSCOPY W/  PARTIAL MEDIAL MENISCECTOMY  05/12/2005   right, performed by Dr. French Ana for torn medial meniscus.  Marland Kitchen ROTATOR CUFF REPAIR Right 2016  . STERNAL WIRES REMOVAL N/A 08/13/2017   Procedure: STERNAL WIRES REMOVAL;  Surgeon: [name], MD;  Location: [facility];  Service: Thoracic;  Laterality: N/A;  . SUBXYPHOID PERICARDIAL WINDOW N/A 04/21/2016   Procedure: SUBXYPHOID PERICARDIAL WINDOW;  Surgeon: [name], MD;  Location: [facility];  Service: Thoracic;   Laterality: N/A;  . TEE WITHOUT CARDIOVERSION N/A 03/30/2016   Procedure: TRANSESOPHAGEAL ECHOCARDIOGRAM (TEE);  Surgeon: [name], MD;  Location: [facility];  Service: Open Heart Surgery;  Laterality: N/A;  . TEE WITHOUT CARDIOVERSION N/A 04/21/2016   Procedure: TRANSESOPHAGEAL ECHOCARDIOGRAM (TEE);  Surgeon: [name], MD;  Location: [facility];  Service: Thoracic;  Laterality: N/A;    Assessment & Plan Clinical Impression:  Mark Cabrera is a 68 year old right-handed male with history of diabetes mellitus maintained on Lantus insulin, CAD with CABG/AVR with postoperative atrial fibrillation,vertebral artery dissection, 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 Per chart review [place] and has a room off the back of a barn. He heats his room with a wood stove and independent PTA.Presented 06/28/2018 with left-sided weakness and tingling. CT scan negative. Patient did not receive TPA. CT angiogram of head and neck occlusion proximal right P2 segment.echocardiogram with ejection fraction of 50-55% severely dilated left atrium. MRI showed small acute infarct in the mesial right temporal occipital lobe as well as chronic left basal ganglia lacunar infarction. Neurology consulted presently maintained on aspirin as well as Eliquis. Tolerating a regular diet. Therapy evaluations completed with recommendations of physical medicine rehabilitation consult. Patient was admitted for operative rehabilitation program. Patient transferred to CIR on 07/01/2018 .   Pt presents with decreased activity tolerance, decreased functional mobility, decreased balance, decreased coordination, decreased safety awareness Limiting pt's independence with leisure/community pursuits.   Leisure History/Participation Premorbid leisure interest/current participation: Mark Cabrera - Vegetable gardening;Nature - Other (Comment);Crafts - Painting;Sports - Exercise  (Comment)(hiking/walking; bird feeding, loves nature) Leisure Participation Style: Alone Awareness of Community Resources: Good-identify 3 post discharge leisure resources Psychosocial / Spiritual Social interaction - Mood/Behavior: Cooperative Academic librarian Appropriate for Education?: Yes Strengths/Weaknesses Patient Strengths/Abilities: Willingness to participate;Active premorbidly Patient weaknesses: Physical limitations TR Patient demonstrates impairments in the following area(s): Endurance;Motor;Safety  Plan Rec Therapy Plan Is patient appropriate for Therapeutic Recreation?: Yes TR Treatment/Interventions: Patient/family education;Community reintegration  Recommendations for other services: None   Discharge Criteria: Patient will be discharged from TR if patient refuses treatment 3 consecutive times without medical reason.  If treatment goals not met, if there is a change in medical status, if patient makes no progress towards goals or if patient is discharged from hospital.  The above assessment, treatment plan, treatment alternatives and goals were discussed and mutually agreed upon: by patient  [place] 07/03/2018, 10:55 AM

## 2018-07-03 NOTE — Progress Notes (Signed)
Physical Therapy Session Note  Patient Details  Name: Mark Cabrera MRN: [id number] Date of Birth: 05-15-1950  Today's Date: 07/03/2018 PT Individual Time: 1502-1600 PT Individual Time Calculation (min): 58 min   Short Term Goals: Week 1:  PT Short Term Goal 1 (Week 1): =LTG due to ELOS  Skilled Therapeutic Interventions/Progress Updates:    Patient received up in recliner, pleasant and willing to participate in therapy but complaining of L shoulder stiffness. Able to perform functional transfers with min guard, then gait trained multiple distances of 122f with no device and MinA for balance throughout session, cues for safe pace of gait, heel-toe pattern, appropriate BOS, step/stride length, and general safety during gait. Worked on L shoulder stretching to tolerance including flexion, abduction, and IR/ER in supine, patient does report some pain from RTC injury; also worked on L elbow flexion/extension and wrist ROM today. Tolerated riding Nustep for 5 minutes x2 on resistance 3 with B LEs only. Finished session with balance training in room with tandem stance based activities with min-modA for balance and safety. He was left up in his recliner with chair alarm active, all other needs met this afternoon.   Therapy Documentation Precautions:  Precautions Precautions: Fall Precaution Comments: Prior R side deficits from prior CVA Restrictions Weight Bearing Restrictions: No Pain: Pain Assessment Pain Scale: 0-10 Pain Score: 0-No pain    Therapy/Group: Individual Therapy   KDeniece ReePT, DPT, CBIS  Supplemental Physical Therapist [facility]   Pager [phone] Rehab Office [phone]   07/03/2018, 4:22 PM

## 2018-07-03 NOTE — Progress Notes (Signed)
Occupational Therapy Session Note  Patient Details  Name: Mark Cabrera MRN: [id number] Date of Birth: 1950/04/13  Today's Date: 07/03/2018 OT Individual Time: 1235-1350 OT Individual Time Calculation (min): 75 min    Short Term Goals: Week 1:  OT Short Term Goal 1 (Week 1): STG=LTG due to LOS  Skilled Therapeutic Interventions/Progress Updates:    Treatment session with focus on functional mobility, transfers, dynamic standing balance, and LUE motor control.  Pt received upright in recliner declining shower but wanting to wash hair.  Ambulated to bathroom with Min assist without AD.  Engaged in washing hair in standing by leaning over in shower, therapist providing CGA for standing balance.  Pt utilized LUE while washing hair.  Pt reports urgent need to toilet.  Voided while in sitting and donned fresh underwear and pants.  Pt completed oral care in standing at sink, utilizing LUE as gross assist during task.  Utilized RW to ambulate to [employer] with Supervision.  Engaged in LUE fine and gross motor control.  Completing box and blocks assessment Rt: 42 and Lt: 34, noting increased ataxia in LUE.  Engaged in [place] activity in standing with focus on motor control when reaching and manipulating with LUE.  Returned to room as above and left upright in recliner with chair alarm on and all needs in reach.  Therapy Documentation Precautions:  Precautions Precautions: Fall Precaution Comments: Prior R side deficits from prior CVA Restrictions Weight Bearing Restrictions: No General:   Vital Signs: Therapy Vitals Temp: 98.7 F (37.1 C) Temp Source: Oral Pulse Rate: 71 Resp: 16 BP: (!) 131/102 Patient Position (if appropriate): Sitting Oxygen Therapy SpO2: 98 % O2 Device: Room Air Pain:  Pt with c/o pain in Lt shoulder, utilizing kpad for pain relief.   Therapy/Group: Individual Therapy  Simonne Come 07/03/2018, 3:46 PM

## 2018-07-04 ENCOUNTER — Inpatient Hospital Stay (HOSPITAL_COMMUNITY): Payer: Medicare Other

## 2018-07-04 ENCOUNTER — Inpatient Hospital Stay (HOSPITAL_COMMUNITY): Payer: Medicare Other | Admitting: Occupational Therapy

## 2018-07-04 LAB — GLUCOSE, CAPILLARY
Glucose-Capillary: 132 mg/dL — ABNORMAL HIGH (ref 70–99)
Glucose-Capillary: 137 mg/dL — ABNORMAL HIGH (ref 70–99)
Glucose-Capillary: 89 mg/dL (ref 70–99)
Glucose-Capillary: 212 mg/dL — ABNORMAL HIGH (ref 70–99)

## 2018-07-04 NOTE — Progress Notes (Signed)
Physical Therapy Session Note  Patient Details  Name: Mark Cabrera MRN: [id number] Date of Birth: 03-25-1950  Today's Date: 07/04/2018 PT Individual Time: 0830-0930 PT Individual Time Calculation (min): 60 min   Short Term Goals: Week 1:  PT Short Term Goal 1 (Week 1): =LTG due to ELOS  Skilled Therapeutic Interventions/Progress Updates:    Patient in supine, RN in to deliver medication.  Performed supine to sit independent.  Seated to don socks and shoes increased time with S reaching to floor and lifting leg to opposite knee.  Patient sit to stand CGA due to imbalance immediately upon standing cues for slower technique and focus straight ahead.  Patient ambulated with RW and CGA to S x 175' to therapy gym.  In parallel bars performed balance activities including SLS with intermittent UE support min to mod A for circling ball under 1 foot, then for alternate taps to cones. Stepping sideways over cones with min A then forward over cones with step through technique min to mod A due to LOB.  In tall kneeling on mat performed core strength with rolling ball forward cues and facilitation for trunk/hip extension.  Side stepping on mat in tall kneeling for hip control, pt c/o some knee tenderness.  Then seated on large blue ball working on ant/post and lateral weight shifts and with mirror finding midline.  Patient performed standing weight shifts/stepping with mirror for feedback, then forward gait no device x 150'  Min A for balance, facilitating trunk rotation and pelvic tilts with trunk shortening/lengthening.  Patient returned to room ambulating without device with min to CGA.  Left seated in recliner with call bell in reach, seat alarm activated.    Therapy Documentation Precautions:  Precautions Precautions: Fall Precaution Comments: Prior R side deficits from prior CVA Restrictions Weight Bearing Restrictions: No Pain: Pain Assessment Pain Type: Acute pain Pain Location: Shoulder Pain  Descriptors / Indicators: Tingling Pain Onset: On-going Pain Intervention(s): Repositioned    Therapy/Group: Individual Therapy  Reginia Naas  Magda Kiel, PT 07/04/2018, 9:57 AM

## 2018-07-04 NOTE — Progress Notes (Signed)
[facility] PHYSICAL MEDICINE & REHABILITATION PROGRESS NOTE   Subjective/Complaints: No problems with throat last night. Slept well  ROS: Patient denies fever, rash, sore throat, blurred vision, nausea, vomiting, diarrhea, cough, shortness of breath or chest pain, joint or back pain, headache, or mood change.     Objective:   No results found. Recent Labs    07/02/18 0617  WBC 3.3*  HGB 12.2*  HCT 36.6*  PLT 165   Recent Labs    07/02/18 0617  NA 137  K 4.3  CL 105  CO2 23  GLUCOSE 172*  BUN 14  CREATININE 0.96  CALCIUM 9.0    Intake/Output Summary (Last 24 hours) at 07/04/2018 0845 Last data filed at 07/04/2018 6503 Gross per 24 hour  Intake 490 ml  Output 1200 ml  Net -710 ml     Physical Exam: Vital Signs Blood pressure (!) 155/75, pulse 64, temperature 98.3 F (36.8 C), temperature source Oral, resp. rate 18, height 5\' 8"  (1.727 m), weight 93.1 kg, SpO2 99 %. Constitutional: No distress . Vital signs reviewed. HEENT: EOMI, oral membranes moist Neck: supple Cardiovascular: RRR without murmur. No JVD    Respiratory: CTA Bilaterally without wheezes or rales. Normal effort    GI: BS +, non-tender, non-distended   Musculoskeletal:Normal range of motion.  General: left shoulder tender with abduction/ER/IR--improved ROM   Neurological: Lacks far left lateral vision. Follows commands.Left hemifacial sensory loss. Also with hemisensory loss LUE and LLE. Decreased FMC LUE and LE. Motor 4+/5 RUE and RLE, 4 to 4+ LUE and LLE prox to distal. DTR's 2+--no motor and sensory changes today Skin: Skin iswarmand dry.  Psychiatric: pleasant and cooperative    Assessment/Plan: 1. Functional deficits secondary to right temporo-occipital infarct which require 3+ hours per day of interdisciplinary therapy in a comprehensive inpatient rehab setting.  Physiatrist is providing close team supervision and 24 hour management of active medical problems listed  below.  Physiatrist and rehab team continue to assess barriers to discharge/monitor patient progress toward functional and medical goals  Care Tool:  Bathing    Body parts bathed by patient: Right arm, Left upper leg, Left arm, Right lower leg, Chest, Abdomen, Left lower leg, Face, Front perineal area, Buttocks, Right upper leg         Bathing assist Assist Level: Minimal Assistance - Patient > 75%     Upper Body Dressing/Undressing Upper body dressing   What is the patient wearing?: Pull over shirt    Upper body assist Assist Level: Minimal Assistance - Patient > 75%    Lower Body Dressing/Undressing Lower body dressing      What is the patient wearing?: Pants     Lower body assist Assist for lower body dressing: Supervision/Verbal cueing     Toileting Toileting    Toileting assist Assist for toileting: Supervision/Verbal cueing     Transfers Chair/bed transfer  Transfers assist     Chair/bed transfer assist level: Contact Guard/Touching assist     Locomotion Ambulation   Ambulation assist      Assist level: Minimal Assistance - Patient > 75% Assistive device: No Device Max distance: 176ft    Walk 10 feet activity   Assist     Assist level: Minimal Assistance - Patient > 75% Assistive device: No Device   Walk 50 feet activity   Assist    Assist level: Minimal Assistance - Patient > 75% Assistive device: No Device    Walk 150 feet activity   Assist  Assist level: Minimal Assistance - Patient > 75% Assistive device: No Device    Walk 10 feet on uneven surface  activity   Assist Walk 10 feet on uneven surfaces activity did not occur: Safety/medical concerns   Assist level: Minimal Assistance - Patient > 75% Assistive device: (no device)   Wheelchair     Assist Will patient use wheelchair at discharge?: No             Wheelchair 50 feet with 2 turns activity    Assist            Wheelchair 150 feet  activity     Assist          Medical Problem List and Plan: 1.Left side weaknesssecondary to small acute infarct mesial right temporal lobe and occipital lobe most likely due to atrial fibrillation -Continue CIR therapies including PT, OT      2. Antithrombotics: -DVT/anticoagulation:ELIQUIS -antiplatelet therapy: Aspirin 81 mg daily    3. Pain Management:Neurontin 100 mg 3 times a day  -kpad and sports rub for left shoulder, low back  -ROM exercises with PT/OT 4. Mood:Provide emotional support -antipsychotic agents: N/A 5. Neuropsych: This patientiscapable of making decisions on hisown behalf. 6. Skin/Wound Care:Routine skin checks 7. Fluids/Electrolytes/Nutrition:encourage PO  -good po intake 8.CAD/CABG. Continue aspirin. No chest pain or shortness of breath 9. History of atrial fibrillation. Continue Eliquis.Cardiac rate controlledon exam today 10. Diabetes mellitus with peripheral neuropathy. Hemoglobin A1c 12.Lantus insulin 18 units daily. NovoLog 12 units 3 times a day.  -inconsistent readings currently -monitor CBG's 11. Permissive hypertension. Monitor with increased mobility. Patient on Coreg 3.125 mg twice a day,Lasix 40 mg a.m. 2 tablets every p.m.    -bp in range 4/22 12. Hyperlipidemia. Lipitor 13. Throat tightness: unclear etiology: resolved. ?anxiety   LOS: 3 days A FACE TO FACE EVALUATION WAS PERFORMED  Meredith Staggers 07/04/2018, 8:45 AM

## 2018-07-04 NOTE — Progress Notes (Signed)
Occupational Therapy Session Note  Patient Details  Name: Mark Cabrera MRN: [id number] Date of Birth: 07/25/1950  Today's Date: 07/04/2018 OT Individual Time: 1410-1515 OT Individual Time Calculation (min): 65 min    Short Term Goals: Week 1:  OT Short Term Goal 1 (Week 1): STG=LTG due to LOS  Skilled Therapeutic Interventions/Progress Updates:    Treatment session with focus on functional mobility, dynamic standing balance, and LUE motor control.  Pt on phone upon arrival, returned 10 mins later with pt wrapping up phone call.  Ambulated to therapy gym without AD with CGA, pt with 1 LOB requiring min assist to correct.  Engaged in fine motor control and coordination with small animal figurines and then assembling pipe tree puzzles in standing with focus on motor control and following sequence/directions.  Pt required increased time due to verbosity and requiring cues to minimize talking to increase success with tasks.  Returned to room carrying box in BUE to continue to challenge functional use of LUE and balance.  Pt with no LOB.  Returned to sitting upright in recliner with chair alarm on and all needs in reach.  Therapy Documentation Precautions:  Precautions Precautions: Fall Precaution Comments: Prior R side deficits from prior CVA Restrictions Weight Bearing Restrictions: No General:   Vital Signs: Therapy Vitals Temp: 98 F (36.7 C) Temp Source: Oral Pulse Rate: 67 Resp: 20 BP: (!) 146/83 Patient Position (if appropriate): Sitting Oxygen Therapy SpO2: 100 % O2 Device: Room Air Pain:  Pt with no c/o pain   Therapy/Group: Individual Therapy  Simonne Come 07/04/2018, 3:25 PM

## 2018-07-04 NOTE — Progress Notes (Signed)
Slept well throughout the night. No complaints of pain or discomfort. Blood glucose 132 this am. Spoke with patient's friend, Mickel Baas this am regarding request to visit patient today. Informed Mickel Baas of current hospital regulations regarding restricted visitation. Patient informed of phone call and thanked this Probation officer for the notification.

## 2018-07-04 NOTE — IPOC Note (Signed)
Overall Plan of Care [facility]) Patient Details Name: Mark Cabrera MRN: [id number] DOB: June 22, 1950  Admitting Diagnosis: <principal problem not specified>  Hospital Problems: Active Problems:   Occipital cerebral infarction [facility])     Functional Problem List: Nursing Skin Integrity, Nutrition, Safety  PT Balance, Motor, Perception, Safety, Sensory  OT Balance, Behavior, Safety, Endurance, Sensory, Motor, Vision  SLP    TR Endurance, Motor, Safety       Basic ADL's: OT Grooming, Bathing, Dressing, Toileting     Advanced  ADL's: OT       Transfers: PT Bed Mobility, Bed to Chair, Car, [name], Futures trader, Metallurgist: PT Ambulation, Emergency planning/management officer, Stairs     Additional Impairments: OT Fuctional Use of Upper Extremity  SLP        TR      Anticipated Outcomes Item Anticipated Outcome  Self Feeding Indep  Swallowing      Basic self-care  Mod I  Toileting  Mod I   Bathroom Transfers Mod I toilet, supervision shower  Bowel/Bladder  Continent to bladder and bowel with min. assist.  Transfers  mod I  Locomotion  mod I with LRAD  Communication     Cognition     Pain  Less than 3,on 1 to 10 scale.  Safety/Judgment  Free from falls during his stay in rehab.   Therapy Plan: PT Intensity: Minimum of 1-2 x/day ,45 to 90 minutes PT Frequency: 5 out of 7 days PT Duration Estimated Length of Stay: 5-7 days OT Intensity: Minimum of 1-2 x/day, 45 to 90 minutes OT Frequency: 5 out of 7 days OT Duration/Estimated Length of Stay: 5-7 days     Due to the current state of emergency, patients may not be receiving their 3-hours of Medicare-mandated therapy.   Team Interventions: Nursing Interventions Patient/Family Education, Disease Management/Prevention, Medication Management, Discharge Planning  PT interventions Ambulation/gait training, Balance/vestibular training, Cognitive remediation/compensation, Community reintegration, Discharge  planning, Disease management/prevention, DME/adaptive equipment instruction, Functional mobility training, Neuromuscular re-education, Pain management, Patient/family education, Psychosocial support, Splinting/orthotics, Stair training, Therapeutic Activities, Therapeutic Exercise, UE/LE Strength taining/ROM, UE/LE Coordination activities, Visual/perceptual remediation/compensation  OT Interventions Training and development officer, Community reintegration, Disease mangement/prevention, Functional electrical stimulation, Neuromuscular re-education, Patient/family education, Self Care/advanced ADL retraining, Splinting/orthotics, Therapeutic Exercise, UE/LE Coordination activities, Visual/perceptual remediation/compensation, UE/LE Strength taining/ROM, Therapeutic Activities, Psychosocial support, Pain management, Functional mobility training, DME/adaptive equipment instruction, Discharge planning  SLP Interventions    TR Interventions Patient/family education, Community reintegration  SW/CM Interventions Discharge Planning, Psychosocial Support, Patient/Family Education   Barriers to Discharge MD  Medical stability  Nursing      PT Decreased caregiver support, Medical stability, Home environment access/layout    OT      SLP      SW       Team Discharge Planning: Destination: PT-Home ,OT- Home , SLP-  Projected Follow-up: PT-Home health PT, OT-  Outpatient OT, SLP-  Projected Equipment Needs: PT-To be determined, OT- To be determined, SLP-  Equipment Details: PT-TBD pending progress, OT-  Patient/family involved in discharge planning: PT- Patient,  OT-Patient, SLP-   MD ELOS: 7-10D Medical Rehab Prognosis:  Excellent Assessment:  68 year old right-handed male with history of diabetes mellitus maintained on Lantus insulin, CAD with CABG/AVR with postoperative atrial fibrillation,vertebral artery dissection, 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 Per chart  review [place] alone [place] friends [place] and has a room off the back of a barn. He heats  his room with a wood stove and independent PTA.Presented 06/28/2018 with left-sided weakness and tingling. CT scan negative. Patient did not receive TPA. CT angiogram of head and neck occlusion proximal right P2 segment.echocardiogram with ejection fraction of 50-55% severely dilated left atrium. MRI showed small acute infarct in the mesial right temporal occipital lobe as well as chronic left basal ganglia lacunar infarction. Neurology consulted presently maintained on aspirin as well as Eliquis. Tolerating a regular diet  Now requiring 24/7 Rehab RN,MD, as well as CIR level PT, OT and SLP.  Treatment team will focus on ADLs and mobility with goals set at [facility]  See Team Conference Notes for weekly updates to the plan of care

## 2018-07-04 NOTE — Progress Notes (Addendum)
Occupational Therapy Session Note  Patient Details  Name: Mark Cabrera MRN: [id number] Date of Birth: 29-Oct-1950  Today's Date: 07/04/2018 OT Individual Time: 0301-3143 OT Individual Time Calculation (min): 57 min    Short Term Goals: Week 1:  OT Short Term Goal 1 (Week 1): STG=LTG due to LOS  Skilled Therapeutic Interventions/Progress Updates:    Pt seen for OT session focusing on therapeutic activity and use of L UE in prep for more functional use of L UE. Pt sitting up in recliner upon arrival, denying pain and agreeable to tx session. Pt voicing changes in vision since most recent CVA, blurring of vision with distance and difficulty tolerating reading glasses that he used PTA. Discussed visual remediation and follow up with eye dr.  He ambulated throughout session with close supervision, CGA, swaying throughout longer distance ambulation. He completed grooming tasks standing at sink with supervision. Able to use L UE at non-dominant level with increased time and effort throughout self-care tasks. He ambulated to therapy gym, CGA, no AD.  Completed fine motor tasks from standing position during throughout tx session including manipulating various resistances of clothes pins and placing on small target to address distal stability. Then completed nuts/bolts fastening task using B UEs simultaneously. Relied on vision 2/2 decreased sensation in B hands needed to completed task. Education provided regarding functional implications of decreased sensation and proprioception.  Pt returned to room at end of session, left seated in recliner with chair pad alarm on and all needs in reach, NT present.   Therapy Documentation Precautions:  Precautions Precautions: Fall Precaution Comments: Prior R side deficits from prior CVA Restrictions Weight Bearing Restrictions: No Pain:   No/denies pain   Therapy/Group: Individual Therapy  Rajanae Mantia L 07/04/2018, 7:04 AM

## 2018-07-05 ENCOUNTER — Inpatient Hospital Stay (HOSPITAL_COMMUNITY): Payer: Medicare Other | Admitting: Occupational Therapy

## 2018-07-05 ENCOUNTER — Inpatient Hospital Stay (HOSPITAL_COMMUNITY): Payer: Medicare Other | Admitting: Physical Therapy

## 2018-07-05 LAB — GLUCOSE, CAPILLARY
Glucose-Capillary: 123 mg/dL — ABNORMAL HIGH (ref 70–99)
Glucose-Capillary: 171 mg/dL — ABNORMAL HIGH (ref 70–99)
Glucose-Capillary: 104 mg/dL — ABNORMAL HIGH (ref 70–99)
Glucose-Capillary: 191 mg/dL — ABNORMAL HIGH (ref 70–99)

## 2018-07-05 NOTE — Progress Notes (Signed)
Physical Therapy Session Note  Patient Details  Name: Mark Cabrera MRN: [id number] Date of Birth: 05-17-1950  Today's Date: 07/05/2018 PT Individual Time: 1003-1033 PT Individual Time Calculation (min): 30 min   Short Term Goals: Week 1:  PT Short Term Goal 1 (Week 1): =LTG due to ELOS  Skilled Therapeutic Interventions/Progress Updates:  Pt received in recliner & agreeable to tx. Pt denies c/o pain. Pt reports he wants to work on LUE use and wishes to ambulate with walking stick when he returns home. Pt transfers sit<>stand with supervision and ambulates room<>gym without AD & CGA with decreased dorsiflexion LLE during swing phase and minimal to no reciprocal arm swing with LUE. Pt completes floor transfer with supervision to transfer floor>mat table; therapist educated pt to carry cell phone on his person when he returns home so he can call for help if he falls & is injured. Pt performed wall pushups with task focusing on LUE forced use for strengthening & NMR with therapist providing instructional cuing for task. Pt performs 10x sit<>Stand without BUE support and supervision with task focusing on BLE strengthening through concentric & eccentric control. At end of session pt left sitting in recliner with all needs in reach & chair alarm set.   Therapy Documentation Precautions:  Precautions Precautions: Fall Precaution Comments: Prior R side deficits from prior CVA Restrictions Weight Bearing Restrictions: No    Therapy/Group: Individual Therapy  Waunita Schooner 07/05/2018, 10:33 AM

## 2018-07-05 NOTE — Progress Notes (Addendum)
Social Work Assessment and Plan  Patient Details  Name: Mark Cabrera MRN: [id number] Date of Birth: 03-18-1950  Today's Date: 07/02/2018  Problem List:  Patient Active Problem List   Diagnosis Date Noted  . Occipital cerebral infarction ([place]) 07/01/2018  . CVA (cerebral vascular accident) ([place]) 06/28/2018  . Hepatic cirrhosis ([place]) 06/29/2017  . Candida albicans infection 01/09/2017  . Healthcare maintenance 12/16/2016  . Lipoma of left upper extremity 09/12/2016  . Chronic knee pain 07/04/2016  . Decreased visual acuity 07/04/2016  . Peripheral neuropathic pain 05/12/2016  . Pericardial effusion a. subxiphoid pericardial window on 04/21/2016 04/28/2016  . Pleural effusion on left, s/p throacentesis 04/18/16 04/28/2016  . 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  . CAD (coronary artery disease), native coronary artery   . Acute diastolic CHF (congestive heart failure) ([place])   . Sleep apnea 08/02/2009  . Diabetes mellitus with neurological manifestation ([place]) 10/18/2007  . Dyslipidemia 05/18/2006  . Hypertensive heart disease   . History of dissection of vertebral artery  ([place]) 08/02/2005   Past Medical History:  Past Medical History:  Diagnosis Date  . Acute diastolic congestive heart failure ([place])   . Aortic valve stenosis s/p AVR 2018  . Atrial fibrillation ([place]) - post-op CABG    04/2016 CHA2DS2VAS score = 5  . Atypical nevi   . Coronary artery disease s/p 2 vessel CABG   . Depression    "years ago"  . Diabetes mellitus   . Dyspnea    in the past   . GERD (gastroesophageal reflux disease)   . Heart murmur   . Hepatic cirrhosis ([place]) 06/29/2017  . History of kidney stones   . Hyperlipidemia    hx of transaminitis secondary to statin and he has decided not to use statins secondary to potential side effects.  . Hypertension   . Nephrolithiasis   . Osteoarthritis, knee   . Sleep apnea     Central apnea. Not using cpap  .  Stroke [facility]) 2007  . Transaminitis     Statin-induced  . 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 2-D echo was normal no embolic source found.   Past Surgical History:  Past Surgical History:  Procedure Laterality Date  . AORTIC VALVE REPLACEMENT N/A 03/30/2016   Procedure: AORTIC VALVE REPLACEMENT (AVR);  Surgeon: [name], MD;  Location: [place];  Service: Open Heart Surgery;  Laterality: N/A;  . CARDIAC CATHETERIZATION N/A 03/15/2016   Procedure: Right/Left Heart Cath and Coronary Angiography;  Surgeon: Burnell Blanks, MD;  Location: [place] [facility];  Service: Cardiovascular;  Laterality: N/A;  . CLIPPING OF ATRIAL APPENDAGE N/A 03/30/2016   Procedure: CLIPPING OF LEFT ATRIAL APPENDAGE;  Surgeon: [name], MD;  Location: [place];  Service: Open Heart Surgery;  Laterality: N/A;  . CORONARY ARTERY BYPASS GRAFT N/A 03/30/2016   Procedure: CORONARY ARTERY BYPASS GRAFTING (CABG) Times Two;  Surgeon: [name], MD;  Location: [place];  Service: Open Heart Surgery;  Laterality: N/A;  . KNEE ARTHROSCOPY W/ PARTIAL MEDIAL MENISCECTOMY  05/12/2005   right, performed by Dr. French Ana for torn medial meniscus.  Marland Kitchen ROTATOR CUFF REPAIR Right 2016  .  STERNAL WIRES REMOVAL N/A 08/13/2017   Procedure: STERNAL WIRES REMOVAL;  Surgeon: [name], MD;  Location: [place];  Service: Thoracic;  Laterality: N/A;  . SUBXYPHOID PERICARDIAL WINDOW N/A 04/21/2016   Procedure: SUBXYPHOID PERICARDIAL WINDOW;  Surgeon: [name], MD;  Location: [place];  Service: Thoracic;  Laterality: N/A;  . TEE WITHOUT CARDIOVERSION N/A 03/30/2016   Procedure: TRANSESOPHAGEAL ECHOCARDIOGRAM (TEE);  Surgeon:  [name], MD;  Location: [place];  Service: Open Heart Surgery;  Laterality: N/A;  . TEE WITHOUT CARDIOVERSION N/A 04/21/2016   Procedure: TRANSESOPHAGEAL ECHOCARDIOGRAM (TEE);  Surgeon: [name], MD;  Location: [place];  Service: Thoracic;  Laterality: N/A;   Social History:  reports that he has never smoked. He has never used smokeless tobacco. He reports current alcohol use of about 1.0 standard drinks of alcohol per week. He reports that he does not use drugs.  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   Abuse/Neglect Abuse/Neglect Assessment Can Be Completed: Yes Physical Abuse: Denies Verbal Abuse: Denies Sexual Abuse: Denies Exploitation of patient/patient's resources: Denies Self-Neglect: Denies  Emotional Status Pt's affect, behavior and adjustment status: Pt was cheerful and motivated to work hard in therapy.  He likes to tell stories and reports feeling well emotionally. Recent Psychosocial Issues: Pt lives on his friend's property and friend has a shower, outdoor refrigerator, and TV/electricity outside his friend's home for pt to use. Psychiatric History: depression, but that was years ago Substance Abuse History: none reported  Patient / Family Perceptions, Expectations & Goals Pt/Family understanding of  illness & functional limitations: Pt/sister have a good understanding of pt's condition and limitations and sister, especially, the need for him to eat better. Premorbid pt/family roles/activities: Pt helps his friend with projects and keeps an eye on his home when he's away.  He is an Control and instrumentation engineer. Anticipated changes in roles/activities/participation: Pt hopes to return to his home eventually and help his friend with odd jobs. Pt/family expectations/goals: Pt wants to get to where he can take care of himself and move back to his home.  Community Resources [employer] Scripts: None Premorbid Home Care/DME Agencies: None Transportation available at discharge: sister  Discharge Planning Living Arrangements: Alone Support Systems: Children, Water engineer, Other relatives Type of Residence: Private residence Insurance Resources: Information systems manager, [employer] (specify county) Pensions consultant: [place] Referred: No Money Management: Patient Does the patient have any problems obtaining your medications?: No Home Management: Pt lives "off the grid" on a friend's farm and has a room off the back of the barn with a wood stove to heat the room and it's open to air.  He does not have much home management. Patient/Family Preliminary Plans: Pt plans to stay with his younger sister for a while in her home to recover and have supervision. Social Work Anticipated Follow Up Needs: HH/OP, Support Group Expected length of stay: ELOS 7 to 10 days  Clinical Impression CSW met with pt on 07-02-18 to  introduce self and role of CSW, as well as to complete assessment.  Pt was talkative with CSW and in good spirits.  He did report being motivated to make sure he doesn't have another stroke and he knows that means making some changes.  He "lives off the grid" by choice and would like to return home after he stays with his sister for a while.   Pt lives on the farm of a good friend and pt  has good family support.  Pt has about 200' walk to the shower his friend built for him.  He does not have running water in the room in the back of the barn.  Pt has a CBG meter, but sister is not sure how well he was keeping up with his blood sugars and insulin.  CSW will f/u on this and ask for diabetes education if appropriate.  No current needs/concerns/questions.  CSW will continue to follow and assist as needed.  Zakayla Martinec, Silvestre Mesi 07/05/2018, 3:48 PM

## 2018-07-05 NOTE — Progress Notes (Signed)
Occupational Therapy Session Note  Patient Details  Name: Mark Cabrera MRN: [id number] Date of Birth: 1950-07-04  Today's Date: 07/05/2018 OT Individual Time: 1230-1400 OT Individual Time Calculation (min): 90 min    Short Term Goals: Week 1:  OT Short Term Goal 1 (Week 1): STG=LTG due to LOS  Skilled Therapeutic Interventions/Progress Updates:    Pt seen for OT ADL bathing/dressing session. Pt sitting up in recliner upon arrival, denying pain and agreeable to tx session. He ambulated throughout room without AD. One major LOB when pt turning to look at himself in the mirror as he was walking by, turning head and requiring mod A to regain balance and prevent fall. He bathed seated on tub bench with supervision overall, CGA when standing to complete pericare/buttock hygiene and VCs for safety awareness. Pt hyperverbal and with decreased balance and safety awareness when distracted. He ambulated out of bathroom and dressed seated in standard chair with close supervision when standing to pull pants up. Grooming tasks completed standing at sink with steadying assist.  Per PT recommendation, trialed walking stick while completing functional ambulation within room. Completed with guarding assist while pt gathered dirty laundry items, min A when retrieving items from floor. Pt cont with scattered incomplete thoughts, moving from one task to the other with decreased safety awareness/ attention to task.  He ambulated within unit carrying meal tray with B UEs. Steadying assist required with VCs for safety and to slow walking speed.  Seated on therapy mat, complete Jenga game/activity using L UE focusing on motor control and in-hand manipulation during task.  Pt returned to room at end of session using walking stick with close supervision, left seated in recliner at end of session, chair belt alarm activated and all needs in reach.   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

## 2018-07-05 NOTE — Progress Notes (Signed)
Physical Therapy Session Note  Patient Details  Name: Mark Cabrera MRN: [id number] Date of Birth: 03/21/1950  Today's Date: 07/05/2018 PT Individual Time: 0800-0900 PT Individual Time Calculation (min): 60 min   Short Term Goals: Week 1:  PT Short Term Goal 1 (Week 1): =LTG due to ELOS  Skilled Therapeutic Interventions/Progress Updates:   Pt received supine in bed and agreeable to PT. Supine>sit transfer without assist or cues. Pt able to don shoes EOB.   Pt perform ambulatory transfer to bathroom with min assist from PT to prevent L LOB. Pt perform oral hygiene standing at sink with supervision assist.   Gait training without AD x 165f to day room and min assist from PT. Pt reports that following prior CVA, he used walking stick for primary AD when out of the house. Additional gait training with walking stick x 2041f [id number] supervision assist from PT and min cues for safety in turns and attention to task. Improved safety, step legnth, posture and symmetry of steps with walking stick vs no AD.   Dynamic balance training with Wii fit.  Penguin slide x 2, and tilt table x 2. Min assist from PT and moderate cues for improved use of hip strategy to improve weight shifting R and L.   Patient returned to room and left sitting in WC [facility] call bell in reach and all needs met.           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

## 2018-07-05 NOTE — Plan of Care (Signed)
  Problem: Consults Goal: RH STROKE PATIENT EDUCATION Description See Patient Education module for education specifics  Outcome: Progressing   Problem: RH BOWEL ELIMINATION Goal: RH STG MANAGE BOWEL WITH ASSISTANCE Description STG Manage Bowel with Min.Assistance.  Outcome: Progressing   Problem: RH BLADDER ELIMINATION Goal: RH STG MANAGE BLADDER WITH ASSISTANCE Description STG Manage Bladder With Min.Assistance  Outcome: Progressing   Problem: RH SKIN INTEGRITY Goal: RH STG SKIN FREE OF INFECTION/BREAKDOWN Description With min. Assist.  Outcome: Progressing Goal: RH STG MAINTAIN SKIN INTEGRITY WITH ASSISTANCE Description STG Maintain Skin Integrity With Min.Assistance.  Outcome: Progressing   Problem: RH SAFETY Goal: RH STG ADHERE TO SAFETY PRECAUTIONS W/ASSISTANCE/DEVICE Description STG Adhere to Safety Precautions With Min.Assistance/Device.  Outcome: Progressing   Problem: RH PAIN MANAGEMENT Goal: RH STG PAIN MANAGED AT OR BELOW PT'S PAIN GOAL Description Less than 3,on 1 to 10 scale.  Outcome: Progressing   Problem: RH KNOWLEDGE DEFICIT Goal: RH STG INCREASE KNOWLEDGE OF HYPERTENSION Outcome: Progressing Goal: RH STG INCREASE KNOWLEGDE OF HYPERLIPIDEMIA Outcome: Progressing Goal: RH STG INCREASE KNOWLEDGE OF STROKE PROPHYLAXIS Outcome: Progressing

## 2018-07-05 NOTE — Progress Notes (Signed)
Social Work Patient ID: Mark Cabrera, male   DOB: October 20, 1950, 68 y.o.   MRN: [id number]   CSW met with pt on 07-03-18 to update him on team conference discussion and targeted d/c date of 07-09-18.  Pt was pleased with this and his progress.  He gave CSW permission to call his sister to update her and to get her address since he will be going to her home at d/c.  She was aware of d/c date when CSW spoke with her today and she is prepared to have him there.  Her home is not very big, but she has room for him.  She will be the one to come pick him up on Tuesday.  CSW will arrange [facility] and get pt any DME he may need.  CSW will continue to follow and assist as needed.

## 2018-07-05 NOTE — Progress Notes (Signed)
Slept well throughout the night. Reported left shoulder pain this am, utilized KPAD to shoulder, effective. No further complaints noted.

## 2018-07-05 NOTE — Progress Notes (Signed)
[place] PHYSICAL MEDICINE & REHABILITATION PROGRESS NOTE   Subjective/Complaints: Feeling ok this morning. Left shoulder still tender with ROM. Using kpad with some relief  ROS: Patient denies fever, rash, sore throat, blurred vision, nausea, vomiting, diarrhea, cough, shortness of breath or chest pain,  headache, or mood change.      Objective:   No results found. No results for input(s): WBC, HGB, HCT, PLT in the last 72 hours. No results for input(s): NA, K, CL, CO2, GLUCOSE, BUN, CREATININE, CALCIUM in the last 72 hours.  Intake/Output Summary (Last 24 hours) at 07/05/2018 0923 Last data filed at 07/05/2018 1287 Gross per 24 hour  Intake 380 ml  Output 1000 ml  Net -620 ml     Physical Exam: Vital Signs Blood pressure (!) 150/78, pulse 64, temperature 98.3 F (36.8 C), temperature source Oral, resp. rate 17, height 5\' 8"  (1.727 m), weight 93.1 kg, SpO2 98 %. Constitutional: No distress . Vital signs reviewed. HEENT: EOMI, oral membranes moist Neck: supple Cardiovascular: RRR without murmur. No JVD    Respiratory: CTA Bilaterally without wheezes or rales. Normal effort    GI: BS +, non-tender, non-distended   Musculoskeletal:Normal range of motion.  General: left shoulder tender with abduction/ER/IR--improved ROM but still pain with more end range of motion  Neurological:a&o x 3.  Limited far left lateral vision.  Left hemifacial sensory loss. Also with hemisensory loss LUE and LLE. Decreased FMC LUE and LE. Motor 4+/5 RUE and RLE, 4 to 4+ LUE and LLE prox to distal. DTR's 2+--no motor and sensory changes today 4/24 Skin: Skin iswarmand dry.  Psychiatric: pleasant    Assessment/Plan: 1. Functional deficits secondary to right temporo-occipital infarct which require 3+ hours per day of interdisciplinary therapy in a comprehensive inpatient rehab setting.  Physiatrist is providing close team supervision and 24 hour management of active medical problems  listed below.  Physiatrist and rehab team continue to assess barriers to discharge/monitor patient progress toward functional and medical goals  Care Tool:  Bathing    Body parts bathed by patient: Right arm, Left upper leg, Left arm, Right lower leg, Chest, Abdomen, Left lower leg, Face, Front perineal area, Buttocks, Right upper leg         Bathing assist Assist Level: Minimal Assistance - Patient > 75%     Upper Body Dressing/Undressing Upper body dressing   What is the patient wearing?: Pull over shirt    Upper body assist Assist Level: Minimal Assistance - Patient > 75%    Lower Body Dressing/Undressing Lower body dressing      What is the patient wearing?: Pants     Lower body assist Assist for lower body dressing: Supervision/Verbal cueing     Toileting Toileting    Toileting assist Assist for toileting: Supervision/Verbal cueing     Transfers Chair/bed transfer  Transfers assist     Chair/bed transfer assist level: Contact Guard/Touching assist     Locomotion Ambulation   Ambulation assist      Assist level: Minimal Assistance - Patient > 75% Assistive device: No Device Max distance: 153ft    Walk 10 feet activity   Assist     Assist level: Minimal Assistance - Patient > 75% Assistive device: No Device   Walk 50 feet activity   Assist    Assist level: Minimal Assistance - Patient > 75% Assistive device: No Device    Walk 150 feet activity   Assist    Assist level: Minimal Assistance - Patient >  75% Assistive device: No Device    Walk 10 feet on uneven surface  activity   Assist Walk 10 feet on uneven surfaces activity did not occur: Safety/medical concerns   Assist level: Minimal Assistance - Patient > 75% Assistive device: (no device)   Wheelchair     Assist Will patient use wheelchair at discharge?: No             Wheelchair 50 feet with 2 turns activity    Assist            Wheelchair  150 feet activity     Assist          Medical Problem List and Plan: 1.Left side weaknesssecondary to small acute infarct mesial right temporal lobe and occipital lobe most likely due to atrial fibrillation -Continue CIR therapies including PT, OT      2. Antithrombotics: -DVT/anticoagulation:ELIQUIS -antiplatelet therapy: Aspirin 81 mg daily    3. Pain Management:Neurontin 100 mg 3 times a day  -kpad and sports rub for left shoulder, low back  -shoulder ROM exercises with PT/OT 4. Mood:Provide emotional support -antipsychotic agents: N/A 5. Neuropsych: This patientiscapable of making decisions on hisown behalf. 6. Skin/Wound Care:Routine skin checks 7. Fluids/Electrolytes/Nutrition:encourage PO  -good po intake 8.CAD/CABG. Continue aspirin. No chest pain or shortness of breath 9. History of atrial fibrillation. Continue Eliquis.Cardiac rate controlledon exam today 10. Diabetes mellitus with peripheral neuropathy. Hemoglobin A1c 12.Lantus insulin 18 units daily. NovoLog 12 units 3 times a day.  -numbers have been higher in late PM which tend to be after lower readings, suggesting over treatment of lower numbers  -no changes today, discuss with RN -monitor CBG's for now 11. Permissive hypertension. Monitor with increased mobility. Patient on Coreg 3.125 mg twice a day,Lasix 40 mg a.m. 2 tablets every p.m.    -bp in range 4/24 12. Hyperlipidemia. Lipitor 13. Throat tightness: unclear etiology: resolved. ?anxiety   LOS: 4 days A FACE TO FACE EVALUATION WAS PERFORMED  Meredith Staggers 07/05/2018, 9:23 AM

## 2018-07-06 ENCOUNTER — Inpatient Hospital Stay (HOSPITAL_COMMUNITY): Payer: Medicare Other | Admitting: Physical Therapy

## 2018-07-06 ENCOUNTER — Inpatient Hospital Stay (HOSPITAL_COMMUNITY): Payer: Medicare Other

## 2018-07-06 LAB — GLUCOSE, CAPILLARY
Glucose-Capillary: 136 mg/dL — ABNORMAL HIGH (ref 70–99)
Glucose-Capillary: 124 mg/dL — ABNORMAL HIGH (ref 70–99)
Glucose-Capillary: 186 mg/dL — ABNORMAL HIGH (ref 70–99)
Glucose-Capillary: 207 mg/dL — ABNORMAL HIGH (ref 70–99)

## 2018-07-06 NOTE — Progress Notes (Signed)
Occupational Therapy Session Note  Patient Details  Name: Mark Cabrera MRN: [id number] Date of Birth: [birth year]  Today's Date: 07/06/2018 OT Individual Time: 1049-1200 OT Individual Time Calculation (min): 71 min    Short Term Goals: Week 1:  OT Short Term Goal 1 (Week 1): STG=LTG due to LOS  Skilled Therapeutic Interventions/Progress Updates:    1:1. Pt received in recliner. Pt declines bathing and dressing this session. Pt ambulates with walking stick and CGA overall and 1 LOB d/t ditraction at sign stopping mid step with MOD A to recover. Pt sits at high low table to work on in hand manipulation  Of scrabble tiles palm<>finger translation, rotation, and stacking. Pt completes threading of small bead onto string with LUE, holding string with LUE and then palm<>finger translation of bead with LUE to thread onto string. Pt completes ambulation while dribbling ball and listing words in alphabetical order. OT had to give pt letter in order. Pt able to complete with MIN A overall for balance and pr frequently stops to think of word.  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

## 2018-07-06 NOTE — Progress Notes (Signed)
Physical Therapy Session Note  Patient Details  Name: Mark Cabrera MRN: [id number] Date of Birth: July 09, 1950  Today's Date: 07/06/2018 PT Individual Time: 0905-1000 1635-1730 PT Individual Time Calculation (min): 55 min and 96mn   Short Term Goals: Week 1:  PT Short Term Goal 1 (Week 1): =LTG due to ELOS  Skilled Therapeutic Interventions/Progress Updates:  Session 1  Pt received supine in bed and agreeable to PT. Supine>sit transfer with supervision assist and increased time. Ambulatory transfer to bathroom with supervision assist and walking stick for AD. Min cues for safety over threshold into and out of bathroom.   PT instructed pt in DGI. See below for results. Demonstrates increased fall risk with score of 16/24. (<19 indicates increased fall risk) See below for results.   Dynamic gait training with and without AD of walking stick. Weave through 4 cones x 2 without AD and x 2 with AD. Stepping over 1 inch obstacles x 4 without AD and x 4 with AD. Supervision assist overall min cues for gait pattern and improved safety with use of AD. Gait also performed over unlevel foam mat with AD x 128f Supervision assist with minc ues for use of stepping strategy to prevent LOB.   Throughout treatment, Pt required supervision assist for all sit<>stand and stand pivot transfers from PT. Min cues for proper set up and use of AD to improve safety with initial sit<>stand.   Patient returned to room and left sitting in recliner with call bell in reach and all needs met.    Session 2.   Pt received sitting in WC and agreeable to PT. PT instructed pt in gait training throughout rehab unit with walking stick x [id number] 225ft.67fervision assist overall with min cues for attention to task as well as awareness of obstacles on the R. Pt aslo instructed in dynamic gait training over mulch and up/down ramp 2x 30ft [id number] assist and min cues for step to gait pattern leading with the  RLE due to knee pain in the sound LE.   Dynamic balance training instructed by PT without AD while engaged in Wii sports bowling. Supervision assist overall with 1 instance of min assist to prevent L/anterior LOB with self generated rotational pertubation. Cues for use of ankle strategy to prevent all other near LOB.   Patient returned to room and [facility] reach and all needs met.             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   Balance: Standardized Balance Assessment Standardized Balance Assessment: Dynamic Gait Index Dynamic Gait Index Level Surface: Mild Impairment Change in Gait Speed: Mild Impairment Gait with Horizontal Head Turns: Mild Impairment Gait with Vertical Head Turns: Mild Impairment Gait and Pivot Turn: Normal Step Over Obstacle: Moderate Impairment Step Around Obstacles: Mild Impairment Steps: Mild Impairment Total Score: 16    Therapy/Group: Individual Therapy  Mark Lenig Lorie Phenix020, 10:04 AM

## 2018-07-06 NOTE — Progress Notes (Signed)
[place] PHYSICAL MEDICINE & REHABILITATION PROGRESS NOTE   Subjective/Complaints: No complaints this morning  ROS: Patient denies chest pains, shortness of breath, nausea vomiting diarrhea or constipation  Objective:   No results found. No results for input(s): WBC, HGB, HCT, PLT in the last 72 hours. No results for input(s): NA, K, CL, CO2, GLUCOSE, BUN, CREATININE, CALCIUM in the last 72 hours.  Intake/Output Summary (Last 24 hours) at 07/06/2018 1248 Last data filed at 07/06/2018 0900 Gross per 24 hour  Intake 480 ml  Output -  Net 480 ml     Physical Exam: Vital Signs Blood pressure (!) 144/77, pulse 68, temperature 98.5 F (36.9 C), temperature source Oral, resp. rate 16, height 5\' 8"  (1.727 m), weight 93.1 kg, SpO2 100 %. Constitutional: No distress . Vital signs reviewed. HEENT: EOMI, oral membranes moist Neck: supple Cardiovascular: RRR without murmur. No JVD    Respiratory: CTA Bilaterally without wheezes or rales. Normal effort    GI: BS +, non-tender, non-distended   Musculoskeletal:Normal range of motion.  General: left shoulder tender with abduction/ER/IR--improved ROM but still pain with more end range of motion  Neurological:a&o x 3.  Limited far left lateral vision.  Left hemifacial sensory loss. Also with hemisensory loss LUE and LLE. Decreased FMC LUE and LE. Motor 4+/5 RUE and RLE, 4 to 4+ LUE and LLE prox to distal. DTR's 2+--no motor and sensory changes today 4/24 Skin: Skin iswarmand dry.  Psychiatric: pleasant    Assessment/Plan: 1. Functional deficits secondary to right temporo-occipital infarct which require 3+ hours per day of interdisciplinary therapy in a comprehensive inpatient rehab setting.  Physiatrist is providing close team supervision and 24 hour management of active medical problems listed below.  Physiatrist and rehab team continue to assess barriers to discharge/monitor patient progress toward functional and medical  goals  Care Tool:  Bathing    Body parts bathed by patient: Right arm, Left upper leg, Left arm, Right lower leg, Chest, Abdomen, Left lower leg, Face, Front perineal area, Buttocks, Right upper leg         Bathing assist Assist Level: Contact Guard/Touching assist     Upper Body Dressing/Undressing Upper body dressing   What is the patient wearing?: Pull over shirt    Upper body assist Assist Level: Independent    Lower Body Dressing/Undressing Lower body dressing      What is the patient wearing?: Pants     Lower body assist Assist for lower body dressing: Supervision/Verbal cueing     Toileting Toileting    Toileting assist Assist for toileting: Supervision/Verbal cueing     Transfers Chair/bed transfer  Transfers assist     Chair/bed transfer assist level: Supervision/Verbal cueing     Locomotion Ambulation   Ambulation assist      Assist level: Supervision/Verbal cueing Assistive device: Other (comment)(walking stick) Max distance: 169ft   Walk 10 feet activity   Assist     Assist level: Supervision/Verbal cueing Assistive device: Other (comment)   Walk 50 feet activity   Assist    Assist level: Supervision/Verbal cueing Assistive device: Other (comment)    Walk 150 feet activity   Assist    Assist level: Supervision/Verbal cueing Assistive device: Other (comment)    Walk 10 feet on uneven surface  activity   Assist Walk 10 feet on uneven surfaces activity did not occur: Safety/medical concerns   Assist level: Supervision/Verbal cueing Assistive device: Other (comment)   Wheelchair     Assist Will patient use  wheelchair at discharge?: No             Wheelchair 50 feet with 2 turns activity    Assist            Wheelchair 150 feet activity     Assist          Medical Problem List and Plan: 1.Left side weaknesssecondary to small acute infarct mesial right temporal lobe and occipital  lobe onset 06/28/2018 most likely due to atrial fibrillation -Continue CIR therapies including PT, OT      2. Antithrombotics: -DVT/anticoagulation:ELIQUIS -antiplatelet therapy: Aspirin 81 mg daily    3. Pain Management:Neurontin 100 mg 3 times a day  -kpad and sports rub for left shoulder, low back  -shoulder ROM exercises with PT/OT 4. Mood:Provide emotional support -antipsychotic agents: N/A 5. Neuropsych: This patientiscapable of making decisions on hisown behalf. 6. Skin/Wound Care:Routine skin checks 7. Fluids/Electrolytes/Nutrition:encourage PO  -480 mL reported on 07/05/2018 8.CAD/CABG. Continue aspirin. No chest pain or shortness of breath 9. History of atrial fibrillation. Continue Eliquis Vitals:   07/05/18 2008 07/06/18 0435  BP: (!) 152/76 (!) 144/77  Pulse: 68 68  Resp: 18 16  Temp: 98.1 F (36.7 C) 98.5 F (36.9 C)  SpO2: 100% 100%  Rate well controlled, BP within desired range 10. Diabetes mellitus with peripheral neuropathy. Hemoglobin A1c 12.Lantus insulin 18 units daily. NovoLog 12 units 3 times a day.  -numbers have been higher in late PM which tend to be after lower readings, suggesting over treatment of lower numbers   CBG (last 3)  Recent Labs    07/05/18 2103 07/06/18 0629 07/06/18 1206  GLUCAP 191* 136* 124*   11. Permissive hypertension. Monitor with increased mobility. Patient on Coreg 3.125 mg twice a day,Lasix 40 mg a.m. 2 tablets every p.m.     12. Hyperlipidemia. Lipitor 13. Throat tightness: unclear etiology: resolved. ?anxiety   LOS: 5 days A FACE TO FACE EVALUATION WAS PERFORMED  Charlett Blake 07/06/2018, 12:48 PM

## 2018-07-07 LAB — GLUCOSE, CAPILLARY
Glucose-Capillary: 91 mg/dL (ref 70–99)
Glucose-Capillary: 84 mg/dL (ref 70–99)
Glucose-Capillary: 136 mg/dL — ABNORMAL HIGH (ref 70–99)
Glucose-Capillary: 182 mg/dL — ABNORMAL HIGH (ref 70–99)

## 2018-07-07 NOTE — Progress Notes (Signed)
[place] PHYSICAL MEDICINE & REHABILITATION PROGRESS NOTE   Subjective/Complaints: Patient wishes to discuss his left shoulder pain.  He has a history of right shoulder rotator cuff tear status post repair.  He has known for a long time that the left shoulder was getting worse.  He has pain when he raises it above 90 degrees especially if he rotates his shoulder inward. He has questions whether this is something can be addressed while he is hospitalized.  ROS: Patient denies chest pains, shortness of breath, nausea vomiting diarrhea or constipation  Objective:   No results found. No results for input(s): WBC, HGB, HCT, PLT in the last 72 hours. No results for input(s): NA, K, CL, CO2, GLUCOSE, BUN, CREATININE, CALCIUM in the last 72 hours.  Intake/Output Summary (Last 24 hours) at 07/07/2018 1133 Last data filed at 07/07/2018 0900 Gross per 24 hour  Intake 840 ml  Output 1100 ml  Net -260 ml     Physical Exam: Vital Signs Blood pressure 138/63, pulse 64, temperature 98.4 F (36.9 C), temperature source Oral, resp. rate 14, height 5\' 8"  (1.727 m), weight 93.1 kg, SpO2 98 %. Constitutional: No distress . Vital signs reviewed. HEENT: EOMI, oral membranes moist Neck: supple Cardiovascular: RRR without murmur. No JVD    Respiratory: CTA Bilaterally without wheezes or rales. Normal effort    GI: BS +, non-tender, non-distended   Musculoskeletal:Normal range of motion.  General: left shoulder tender with abduction/ER/IR-positive impingement at 90 degrees neurological:a&o x 3.  Limited far left lateral vision.  Left hemifacial sensory loss. Also with hemisensory loss LUE and LLE. Decreased FMC LUE and LE. Motor 4+/5 RUE and RLE, 4 to 4+ LUE and LLE prox to distal. DTR's 2+--no motor and sensory changes today 4/24 Patient indicates that temperature sensation is still diminished in the right upper extremity from prior stroke Skin: Skin iswarmand dry.  Psychiatric:  pleasant    Assessment/Plan: 1. Functional deficits secondary to right temporo-occipital infarct which require 3+ hours per day of interdisciplinary therapy in a comprehensive inpatient rehab setting.  Physiatrist is providing close team supervision and 24 hour management of active medical problems listed below.  Physiatrist and rehab team continue to assess barriers to discharge/monitor patient progress toward functional and medical goals  Care Tool:  Bathing    Body parts bathed by patient: Right arm, Left upper leg, Left arm, Right lower leg, Chest, Abdomen, Left lower leg, Face, Front perineal area, Buttocks, Right upper leg         Bathing assist Assist Level: Contact Guard/Touching assist     Upper Body Dressing/Undressing Upper body dressing   What is the patient wearing?: Pull over shirt    Upper body assist Assist Level: Independent    Lower Body Dressing/Undressing Lower body dressing      What is the patient wearing?: Pants     Lower body assist Assist for lower body dressing: Supervision/Verbal cueing     Toileting Toileting    Toileting assist Assist for toileting: Supervision/Verbal cueing     Transfers Chair/bed transfer  Transfers assist     Chair/bed transfer assist level: Supervision/Verbal cueing     Locomotion Ambulation   Ambulation assist      Assist level: Supervision/Verbal cueing Assistive device: Other (comment)(walking stick) Max distance: 165ft   Walk 10 feet activity   Assist     Assist level: Supervision/Verbal cueing Assistive device: Other (comment)   Walk 50 feet activity   Assist    Assist level:  Supervision/Verbal cueing Assistive device: Other (comment)    Walk 150 feet activity   Assist    Assist level: Supervision/Verbal cueing Assistive device: Other (comment)    Walk 10 feet on uneven surface  activity   Assist Walk 10 feet on uneven surfaces activity did not occur: Safety/medical  concerns   Assist level: Supervision/Verbal cueing Assistive device: Other (comment)   Wheelchair     Assist Will patient use wheelchair at discharge?: No             Wheelchair 50 feet with 2 turns activity    Assist            Wheelchair 150 feet activity     Assist          Medical Problem List and Plan: 1.Left side weaknesssecondary to small acute infarct mesial right temporal lobe and occipital lobe onset 06/28/2018 most likely due to atrial fibrillation -Continue CIR therapies including PT, OT      2. Antithrombotics: -DVT/anticoagulation:ELIQUIS -antiplatelet therapy: Aspirin 81 mg daily    3. Pain Management:Neurontin 100 mg 3 times a day  -kpad and sports rub for left shoulder, low back  -shoulder ROM exercises with PT/OT 4. Mood:Provide emotional support -antipsychotic agents: N/A 5. Neuropsych: This patientiscapable of making decisions on hisown behalf. 6. Skin/Wound Care:Routine skin checks 7. Fluids/Electrolytes/Nutrition:encourage PO  -480 mL reported on 07/05/2018 8.CAD/CABG. Continue aspirin. No chest pain or shortness of breath 9. History of atrial fibrillation. Continue Eliquis Vitals:   07/06/18 1948 07/07/18 0422  BP: 133/87 138/63  Pulse: 67 64  Resp:  14  Temp:  98.4 F (36.9 C)  SpO2:  98%  Rate well controlled, BP within desired range 10. Diabetes mellitus with peripheral neuropathy. Hemoglobin A1c 12.Lantus insulin 18 units daily. NovoLog 12 units 3 times a day.  -numbers have been higher in late PM which tend to be after lower readings, suggesting over treatment of lower numbers   CBG (last 3)  Recent Labs    07/06/18 1624 07/06/18 1945 07/07/18 0631  GLUCAP 186* 207* 91  Lability, a.m. CBG looks good, will monitor for now, may need mealtime coverage increased 11. Permissive hypertension. Monitor with increased mobility. Patient on Coreg 3.125 mg twice a  day,Lasix 40 mg a.m. 2 tablets every p.m.     12. Hyperlipidemia. Lipitor 13.  Left subacromial impingement with pain at 90 degrees.  We discussed that elective surgeries are avoided for approximately 6 months post acute stroke.  He will have to work around his range of motion restrictions during his stroke rehabilitation.  LOS: 6 days A FACE TO FACE EVALUATION WAS PERFORMED  Charlett Blake 07/07/2018, 11:33 AM

## 2018-07-07 NOTE — Progress Notes (Addendum)
Patient refused his lipitor claims he's allergic to statins. On  Physician's sticky notes.

## 2018-07-08 ENCOUNTER — Inpatient Hospital Stay (HOSPITAL_COMMUNITY): Payer: Medicare Other | Admitting: Occupational Therapy

## 2018-07-08 ENCOUNTER — Inpatient Hospital Stay (HOSPITAL_COMMUNITY): Payer: Medicare Other

## 2018-07-08 ENCOUNTER — Inpatient Hospital Stay (HOSPITAL_COMMUNITY): Payer: Medicare Other | Admitting: Physical Therapy

## 2018-07-08 DIAGNOSIS — M75102 Unspecified rotator cuff tear or rupture of left shoulder, not specified as traumatic: Secondary | ICD-10-CM

## 2018-07-08 LAB — GLUCOSE, CAPILLARY
Glucose-Capillary: 156 mg/dL — ABNORMAL HIGH (ref 70–99)
Glucose-Capillary: 140 mg/dL — ABNORMAL HIGH (ref 70–99)
Glucose-Capillary: 117 mg/dL — ABNORMAL HIGH (ref 70–99)
Glucose-Capillary: 154 mg/dL — ABNORMAL HIGH (ref 70–99)
Glucose-Capillary: 181 mg/dL — ABNORMAL HIGH (ref 70–99)

## 2018-07-08 MED ORDER — TRIAMCINOLONE ACETONIDE 40 MG/ML IJ SUSP
40.0000 mg | Freq: Once | INTRAMUSCULAR | Status: AC
  Administered 2018-07-08: 40 mg via INTRA_ARTICULAR
  Filled 2018-07-08: qty 1

## 2018-07-08 MED ORDER — LIDOCAINE HCL (PF) 1 % IJ SOLN
5.0000 mL | Freq: Once | INTRAMUSCULAR | Status: AC
  Administered 2018-07-08: 5 mL
  Filled 2018-07-08 (×2): qty 5

## 2018-07-08 NOTE — Progress Notes (Signed)
   Procedure Note: Dx: rotator cuff syndrome left Major joint injection  After informed consent and preparation of the skin with betadine and isopropyl alcohol, I injected 6mg  (1cc) of celestone and 4cc of 1% lidocaine into the left subacromial space via lateral approach. Additionally, aspiration was performed prior to injection. The patient tolerated well, and no complications were encountered. Afterward the area was cleaned and dressed. Post- injection instructions were provided.   I reviewed left shoulder xrays which show degenerative disease/calcific tendonitis    Meredith Staggers, MD, [facility] 07/08/2018

## 2018-07-08 NOTE — Progress Notes (Signed)
Occupational Therapy Session Note  Patient Details  Name: Mark Cabrera MRN: [id number] Date of Birth: 1951-02-23  Today's Date: 07/08/2018 OT Individual Time: [phone] and [id number] OT Individual Time Calculation (min): 57 min and 55 70 min   Short Term Goals: Week 1:  OT Short Term Goal 1 (Week 1): STG=LTG due to LOS  Skilled Therapeutic Interventions/Progress Updates:    Session One: Pt seen for OT session focusing on functional transfers and education. Pt in supine upon arrival, denying pain and agreeable to tx session.  He donned shoes seated EOB mod I, cont to report absent sensation in B feet. Education provided regarding decreased sensation and functional implications including skin checks and safety risk. He ambulated throughout session using walking stick at distant supervision-mod I level with cuing provided for UE "swing" to promote normal movement pattern during ambulation.  In ADL apartment, completed simulated tub/shower transfer as he will have at home. Discussed pros/cons of bench vs. Standard shower chair. Pt agreeing with recommendation for tub transfer bench due to balance and vestibular/vision deficits. Completed simulated transfer utilizing bench at [place] I level. Completed sit<>stand from low soft surface couch mod I.  In therapy gym, completed floor transfer in simulation of getting up from fall. Pt able to recall technique taught in previous session for technique of getting up. Education provided regarding fall risk and ways to reduce risk of falls. Pt ambulated back to room at end of session using walking stuck with supervision.  Pt left seated in recliner at end of session, all needs in reach and chair pad alarm on.   Session Two: Pt  Seen for OT session focusing on functional standing balance/endurance during gross motor tasks and ADL re-training at shower level. Pt sitting up in recliner upon arrival, denying pain and agreeable to tx session.  He  ambulated throughout room and unit using walking stick distant supervision-mod I level. In therapy gym, completed B manual dynamic standing task, standing on foam wedge to complete towel folding task. Completed with min A for balance, using B UEs to complete task.  Seated EOM, completed stereogenosis assessment. Pt able to correctly determine 3/3 objects on R and L, however, required significantly increased time to identify items using L hand due to impaired sensation. He ambulated back to room and gathered items in prep for showering task. Education to complete doffing of clothes from seated position to decrease risk of fall. Pt bathed and dressed seated on tub bench in shower mod I with increased time. Following seated rest break, he completed grooming tasks standing at sink mod I, using L UE at non-dominant level to assist. He gathered dirty towels from floor with supervision to bend over to retrieve item, demonstrating good safety awareness to pick up and place one at a time.  Pt left seated in recliner at end of session, all needs in reach and chair pad alarm on.   Therapy Documentation Precautions:  Precautions Precautions: Fall Precaution Comments: Prior R side deficits from prior CVA Restrictions Weight Bearing Restrictions: No   Therapy/Group: Individual Therapy  Caramia Boutin L 07/08/2018, 7:00 AM

## 2018-07-08 NOTE — Discharge Summary (Signed)
Physician Discharge Summary  Patient ID: Cabrera Mark MRN: [id number] DOB/AGE: 70-Nov-1952 70 y.o.  Admit date: 07/01/2018 Discharge date: 07/09/2018  Discharge Diagnoses:  Active Problems:   Occipital cerebral infarction [facility]) DVT prophylaxis Pain management CAD with CABG History ofatrial fibrillation Diabetes mellitus with peripheral neuropathy Permissive hypertension Hyperlipidemia Left subacromial impingement   Discharged Condition: stable  Significant Diagnostic Studies: Ct Angio Head W Or Wo Contrast  Result Date: 06/28/2018 CLINICAL DATA:  Acute stroke. Partial field cut. Left-sided weakness. EXAM: CT ANGIOGRAPHY HEAD AND NECK CT PERFUSION BRAIN TECHNIQUE: Multidetector CT imaging of the head and neck was performed using the standard protocol during bolus administration of intravenous contrast. Multiplanar CT image reconstructions and MIPs were obtained to evaluate the vascular anatomy. Carotid stenosis measurements (when applicable) are obtained utilizing NASCET criteria, using the distal internal carotid diameter as the denominator. Multiphase CT imaging of the brain was performed following IV bolus contrast injection. Subsequent parametric perfusion maps were calculated using RAPID software. CONTRAST:  165mL OMNIPAQUE IOHEXOL 350 MG/ML SOLN COMPARISON:  CT head 06/28/2018 FINDINGS: Review of the MIP images confirms the above findings CTA NECK FINDINGS Aortic arch: Mild atherosclerotic disease aortic arch without aneurysm or dissection. Atherosclerotic disease proximal left subclavian artery. Proximal great vessels widely patent. Right carotid system: Atherosclerotic calcification proximal right carotid bifurcation and internal carotid artery with approximately 25% diameter stenosis right internal carotid artery. Left carotid system: Atherosclerotic calcification left carotid bifurcation without significant stenosis Vertebral arteries: Right vertebral artery dominant. Mild to  moderate calcific stenosis proximal right vertebral artery. Mild stenosis distal right vertebral artery due to calcific stenosis. Non dominant left vertebral artery occludes V4 segment. Skeleton: Negative Other neck: No acute abnormality Upper chest: Negative Review of the MIP images confirms the above findings CTA HEAD FINDINGS Anterior circulation: Atherosclerotic calcification cavernous carotid bilaterally with mild stenosis bilaterally. Anterior and middle cerebral arteries patent bilaterally without significant stenosis. Posterior circulation: Mild stenosis right V3 segment due to calcific disease. Right PICA patent. Small left vertebral artery supplies PICA. Occlusion of the left V4 segment. Occlusion proximal right P2 segment. Right superior cerebellar artery widely patent. Basilar widely patent. Left superior cerebellar and left posterior cerebral artery widely patent. Venous sinuses: Patent Anatomic variants: None Delayed phase: Not performed Review of the MIP images confirms the above findings CT Brain Perfusion Findings: ASPECTS: 10 CBF (<30%) Volume: 67mL Perfusion (Tmax>6.0s) volume: 46mL Mismatch Volume: 66mL Infarction Location:Small areas of delayed perfusion in the right hippocampus, right occipital lobe, right thalamus corresponding to the right P2 occlusion. IMPRESSION: 1. Occlusion proximal right P2 segment. Corresponding areas of delayed perfusion in the right posterior cerebral artery involving the right hippocampus, thalamus, and right occipital lobe. MRI recommended to evaluate for acute infarct in these areas. 2. Mild atherosclerotic disease in the carotid artery bilaterally 3. Dominant right vertebral artery with mild-to-moderate stenosis proximally and mild stenosis distally. Non dominant left vertebral artery with occlusion left V4 segment. 4. These results were called by telephone at the time of interpretation on 06/28/2018 at 5:38 pm to Dr. Roland Rack , who verbally acknowledged  these results. Electronically Signed   By: Franchot Gallo M.D.   On: 06/28/2018 17:40   Ct Head Wo Contrast  Result Date: 06/29/2018 CLINICAL DATA:  68 year old male with right P2 occlusion and small infarcts in the right hippocampus and occipital lobe on CTA and MRI yesterday. Worsening neuro symptoms, query hemorrhagic conversion. EXAM: CT HEAD WITHOUT CONTRAST TECHNIQUE: Contiguous axial images were obtained from the base of  the skull through the vertex without intravenous contrast. COMPARISON:  Brain MRI 06/28/2018.  CTA head and neck 06/28/2018. FINDINGS: Brain: No acute intracranial hemorrhage identified. No midline shift, mass effect, or evidence of intracranial mass lesion. Progressed hypodensity in the right hippocampus and occipital lobe white matter since the CT yesterday (coronal image 46 and series 3, image 17). The occipital white matter involvement is new or increased from the DWI yesterday. Stable gray-white matter differentiation elsewhere including in the right thalamus. Basilar cisterns remain normal. No ventriculomegaly. Vascular: Calcified atherosclerosis at the skull base. Skull: No acute osseous abnormality identified. Sinuses/Orbits: Visualized paranasal sinuses and mastoids are stable and well pneumatized. Other: No acute orbit or scalp soft tissue findings. IMPRESSION: 1. Negative for hemorrhagic transformation of the right PCA territory ischemia, but increased hypodensity in the right occipital lobe white matter and right hippocampus since yesterday. 2. No new intracranial abnormality. Electronically Signed   By: Genevie Ann M.D.   On: 06/29/2018 19:54   Ct Angio Neck W Or Wo Contrast  Result Date: 06/28/2018 CLINICAL DATA:  Acute stroke. Partial field cut. Left-sided weakness. EXAM: CT ANGIOGRAPHY HEAD AND NECK CT PERFUSION BRAIN TECHNIQUE: Multidetector CT imaging of the head and neck was performed using the standard protocol during bolus administration of intravenous contrast.  Multiplanar CT image reconstructions and MIPs were obtained to evaluate the vascular anatomy. Carotid stenosis measurements (when applicable) are obtained utilizing NASCET criteria, using the distal internal carotid diameter as the denominator. Multiphase CT imaging of the brain was performed following IV bolus contrast injection. Subsequent parametric perfusion maps were calculated using RAPID software. CONTRAST:  115mL OMNIPAQUE IOHEXOL 350 MG/ML SOLN COMPARISON:  CT head 06/28/2018 FINDINGS: Review of the MIP images confirms the above findings CTA NECK FINDINGS Aortic arch: Mild atherosclerotic disease aortic arch without aneurysm or dissection. Atherosclerotic disease proximal left subclavian artery. Proximal great vessels widely patent. Right carotid system: Atherosclerotic calcification proximal right carotid bifurcation and internal carotid artery with approximately 25% diameter stenosis right internal carotid artery. Left carotid system: Atherosclerotic calcification left carotid bifurcation without significant stenosis Vertebral arteries: Right vertebral artery dominant. Mild to moderate calcific stenosis proximal right vertebral artery. Mild stenosis distal right vertebral artery due to calcific stenosis. Non dominant left vertebral artery occludes V4 segment. Skeleton: Negative Other neck: No acute abnormality Upper chest: Negative Review of the MIP images confirms the above findings CTA HEAD FINDINGS Anterior circulation: Atherosclerotic calcification cavernous carotid bilaterally with mild stenosis bilaterally. Anterior and middle cerebral arteries patent bilaterally without significant stenosis. Posterior circulation: Mild stenosis right V3 segment due to calcific disease. Right PICA patent. Small left vertebral artery supplies PICA. Occlusion of the left V4 segment. Occlusion proximal right P2 segment. Right superior cerebellar artery widely patent. Basilar widely patent. Left superior cerebellar and  left posterior cerebral artery widely patent. Venous sinuses: Patent Anatomic variants: None Delayed phase: Not performed Review of the MIP images confirms the above findings CT Brain Perfusion Findings: ASPECTS: 10 CBF (<30%) Volume: 9mL Perfusion (Tmax>6.0s) volume: 64mL Mismatch Volume: 37mL Infarction Location:Small areas of delayed perfusion in the right hippocampus, right occipital lobe, right thalamus corresponding to the right P2 occlusion. IMPRESSION: 1. Occlusion proximal right P2 segment. Corresponding areas of delayed perfusion in the right posterior cerebral artery involving the right hippocampus, thalamus, and right occipital lobe. MRI recommended to evaluate for acute infarct in these areas. 2. Mild atherosclerotic disease in the carotid artery bilaterally 3. Dominant right vertebral artery with mild-to-moderate stenosis proximally and mild stenosis distally.  Non dominant left vertebral artery with occlusion left V4 segment. 4. These results were called by telephone at the time of interpretation on 06/28/2018 at 5:38 pm to Dr. Roland Rack , who verbally acknowledged these results. Electronically Signed   By: Franchot Gallo M.D.   On: 06/28/2018 17:40   Mr Brain Wo Contrast  Result Date: 06/28/2018 CLINICAL DATA:  Left leg weakness. Intermittent headache for 1 week and dizziness. History of stroke. EXAM: MRI HEAD WITHOUT CONTRAST TECHNIQUE: Multiplanar, multiecho pulse sequences of the brain and surrounding structures were obtained without intravenous contrast. COMPARISON:  Head CT, CTA, and CTP 06/28/2018. Brain MRI 06/13/2012. FINDINGS: Brain: There is restricted diffusion in the right hippocampus consistent with acute infarct. Punctate foci of acute infarction are also present in the right occipital lobe. A few scattered chronic microhemorrhages are present in both cerebral hemispheres with sparing of the deep gray nuclei. A chronic lacunar infarct in the left caudate nucleus is new. A  small chronic infarct is again seen in the medulla on the left. No mass, midline shift, or extra-axial fluid collection is evident. The ventricles and sulci are within normal limits for age. Vascular: Abnormal appearance of the distal left vertebral artery corresponding to left V4 segment occlusion demonstrated on CTA. Skull and upper cervical spine: Unremarkable bone marrow signal. Sinuses/Orbits: Bilateral cataract extraction. Paranasal sinuses and mastoid air cells are clear. Other: None. IMPRESSION: 1. Small acute infarcts in the mesial right temporal lobe and right occipital lobe. 2. Chronic left basal ganglia lacunar infarct, new from 2014. Electronically Signed   By: Logan Bores M.D.   On: 06/28/2018 20:18   Ct Cerebral Perfusion W Contrast  Result Date: 06/28/2018 CLINICAL DATA:  Acute stroke. Partial field cut. Left-sided weakness. EXAM: CT ANGIOGRAPHY HEAD AND NECK CT PERFUSION BRAIN TECHNIQUE: Multidetector CT imaging of the head and neck was performed using the standard protocol during bolus administration of intravenous contrast. Multiplanar CT image reconstructions and MIPs were obtained to evaluate the vascular anatomy. Carotid stenosis measurements (when applicable) are obtained utilizing NASCET criteria, using the distal internal carotid diameter as the denominator. Multiphase CT imaging of the brain was performed following IV bolus contrast injection. Subsequent parametric perfusion maps were calculated using RAPID software. CONTRAST:  162mL OMNIPAQUE IOHEXOL 350 MG/ML SOLN COMPARISON:  CT head 06/28/2018 FINDINGS: Review of the MIP images confirms the above findings CTA NECK FINDINGS Aortic arch: Mild atherosclerotic disease aortic arch without aneurysm or dissection. Atherosclerotic disease proximal left subclavian artery. Proximal great vessels widely patent. Right carotid system: Atherosclerotic calcification proximal right carotid bifurcation and internal carotid artery with approximately  25% diameter stenosis right internal carotid artery. Left carotid system: Atherosclerotic calcification left carotid bifurcation without significant stenosis Vertebral arteries: Right vertebral artery dominant. Mild to moderate calcific stenosis proximal right vertebral artery. Mild stenosis distal right vertebral artery due to calcific stenosis. Non dominant left vertebral artery occludes V4 segment. Skeleton: Negative Other neck: No acute abnormality Upper chest: Negative Review of the MIP images confirms the above findings CTA HEAD FINDINGS Anterior circulation: Atherosclerotic calcification cavernous carotid bilaterally with mild stenosis bilaterally. Anterior and middle cerebral arteries patent bilaterally without significant stenosis. Posterior circulation: Mild stenosis right V3 segment due to calcific disease. Right PICA patent. Small left vertebral artery supplies PICA. Occlusion of the left V4 segment. Occlusion proximal right P2 segment. Right superior cerebellar artery widely patent. Basilar widely patent. Left superior cerebellar and left posterior cerebral artery widely patent. Venous sinuses: Patent Anatomic variants: None Delayed phase:  Not performed Review of the MIP images confirms the above findings CT Brain Perfusion Findings: ASPECTS: 10 CBF (<30%) Volume: 23mL Perfusion (Tmax>6.0s) volume: 54mL Mismatch Volume: 87mL Infarction Location:Small areas of delayed perfusion in the right hippocampus, right occipital lobe, right thalamus corresponding to the right P2 occlusion. IMPRESSION: 1. Occlusion proximal right P2 segment. Corresponding areas of delayed perfusion in the right posterior cerebral artery involving the right hippocampus, thalamus, and right occipital lobe. MRI recommended to evaluate for acute infarct in these areas. 2. Mild atherosclerotic disease in the carotid artery bilaterally 3. Dominant right vertebral artery with mild-to-moderate stenosis proximally and mild stenosis distally.  Non dominant left vertebral artery with occlusion left V4 segment. 4. These results were called by telephone at the time of interpretation on 06/28/2018 at 5:38 pm to Dr. Roland Rack , who verbally acknowledged these results. Electronically Signed   By: Franchot Gallo M.D.   On: 06/28/2018 17:40   Ct Head Code Stroke Wo Contrast  Result Date: 06/28/2018 CLINICAL DATA:  Code stroke.  Left leg weakness EXAM: CT HEAD WITHOUT CONTRAST TECHNIQUE: Contiguous axial images were obtained from the base of the skull through the vertex without intravenous contrast. COMPARISON:  None. FINDINGS: Brain: No evidence of acute infarction, hemorrhage, hydrocephalus, extra-axial collection or mass lesion/mass effect. Vascular: Negative for hyperdense vessel Skull: Negative Sinuses/Orbits: Paranasal sinuses clear.  Bilateral cataract surgery Other: None ASPECTS ([place] Stroke Program Early CT Score) - Ganglionic level infarction (caudate, lentiform nuclei, internal capsule, insula, M1-M3 cortex): 7 - Supraganglionic infarction (M4-M6 cortex): 3 Total score (0-10 with 10 being normal): 10 IMPRESSION: 1. No acute abnormality 2. ASPECTS is 10 Electronically Signed   By: Franchot Gallo M.D.   On: 06/28/2018 17:05    Labs:  Basic Metabolic Panel: Recent Labs  Lab 07/02/18 0617  NA 137  K 4.3  CL 105  CO2 23  GLUCOSE 172*  BUN 14  CREATININE 0.96  CALCIUM 9.0    CBC: Recent Labs  Lab 07/02/18 0617  WBC 3.3*  NEUTROABS 1.6*  HGB 12.2*  HCT 36.6*  MCV 88.4  PLT 165    CBG: Recent Labs  Lab 07/07/18 0631 07/07/18 1141 07/07/18 1722 07/07/18 2107 07/08/18 0228  GLUCAP 91 84 136* 182* 156*   Family history. Mother with hypertension diabetes father with alcohol abuse. Denies cancer  Brief HPI:    Mark Cabrera is a 68 year old right-handed male with history of diabetes mellitus, CAD with CABG with postoperative atrial fibrillation, vertebral artery dissection, CVA 2017 with right-sided residual  weakness maintained on aspirin. Per chart review [place] alone [place] independent prior to admission. Presented 06/28/2018 with left-sided weakness and tingling. CT scan negative. Patient did not receive TPA. CT angiogram of head and neck showed occlusion proximal right P2 segment. Echocardiogram with ejection fraction of 50-55% severely dilated left atrium. MRI showed small acute infarct in the mesial right temporal occipital lobe as well as chronic left basal ganglia lacunar infarction. Neurology consulted maintained on aspirin as well as Eliquis. Tolerating a regular diet. Patient was admitted for a comprehensive rehabilitation program  Hospital Course: LEONARD FEIGEL was admitted to rehab 07/01/2018 for inpatient therapies to consist of PT, ST and OT at least three hours five days a week. Past admission physiatrist, therapy team and rehab RN have worked together to provide customized collaborative inpatient rehab.pertaining to patient small acute infarct mesial right temporal lobe and occipital lobe he remained on aspirin as well as eliquis per neurology  services. Pain management with the use of Neurontin 100 mg 3 times daily.Left subacromial impingement with pain at 90 it was discussed that elective surgeries avoided for approximate 6 months post acute stroke. Continued to work with range of motion restrictions during his stroke rehabilitation. Noted history of CAD with CABG complicated by atrial fibrillation no chest pain or shortness of breath he continued on low-dose aspirin as well as Eliquis for both CVA prophylaxis and atrial fibrillation. Cardiac rate controlled. Patient did have a history of diabetes mellitus hemoglobin A1c of 12 continued on Lantus insulin as directed with full teaching in regards to diabetes. It was recommended to follow-up with PCP. Labile blood pressure and continue to monitor as he remained off of Coreg and Lasix. Recommendations were made to follow up with PCP.He will  continue Lipitor for history of hyperlipidemia. Underwent left rotator cuff injection 07/08/2018 for suspect impingement syndrome rotator cuff syndrome after x-rays been completed showing degenerative disc calcific tendinitis.  Physical exam. Blood pressure 154/72 pulse 67 temperature 98.4 respirations 18 oxygen saturations 98% room air Constitutional. Well-developed and well-nourished HEENT Head. Normocephalic and atraumatic Eyes pupils round and reactive to light without discharge no nystagmus Neck normal range of motion no tracheal deviation no thyromegaly without JVD Cardiovascular normal rate and rhythm . Murmur heard Respiratory. Effort normal and breath sounds normal no respiratory distress no wheezes GI soft exhibits no distention nontender without rebound Musculoskeletal normal range of motion no deformity or edema Neurological. Alert no acute distress fair insight to deficits. Lacks peripheral vision too far left side. Functional memory and language intact. Follows commands. Left hemifacial sensory loss. Motor strength 4+ out of 5 right upper and right lower extremity 4-4+ out of 5 left upper extremity left lower extremity proximal to distal  Rehab course: During patient's stay in rehab weekly team conferences were held to monitor patient's progress, set goals and discuss barriers to discharge. At admission, patient required minimal assist to ambulate 125 feet rolling walker, min to moderate assist stand pivot transfers, minimal assist sit to stand. Set up for upper body bathing and dressing moderate assist lower body bathing minimal assist lower body dressing  He/She  has had improvement in activity tolerance, balance, postural control as well as ability to compensate for deficits. He/She has had improvement in functional use RUE/LUE  and RLE/LLE as well as improvement in awareness.supine to sit transfers with supervision assist. Ambulatory transfers to the bathroom with supervision assist  and walking stick for assistive device. Dynamic gait training with and without assistive device. He could negotiate around obstacles without assistive device. Stepping over 1 inch obstacles without assistive device. Ambulation also performed over unlevel surfaces. Patient required supervision assist for all sit to stand and stand pivot transfers. He could gather his belongings for activities daily living and homemaking it was advised no driving. Teaching completed and plan discharge to home       Disposition: discharge to home   [place] Instructions:no driving, smoking or alcohol  Follow-up with primary care physician on resuming Coreg and Lasix  Medications at discharge. 1. Tylenol as needed 2.Eliquis 5 mg twice a day 3. Aspirin 81 mg by mouth daily 4. Lipitor 80 mg by mouth daily 5. Neurontin 100 mg by mouth 3 times a day 6. NovoLog 12 units 3 times a day with meals 1. Lantus insulin 18 units twice a day  Discharge Instructions    Ambulatory referral to Neurology   Complete by:  As directed  An appointment is requested in approximately 6 weeks right temporal occipital infarction   Ambulatory referral to Physical Medicine Rehab   Complete by:  As directed    Moderate complexity follow-up 1-2 weeks right temporal occipital infarction      Follow-up Information    Meredith Staggers, MD Follow up.   Specialty:  Physical Medicine and Rehabilitation Why:  office to call for appointment Contact information: [address] [place] [address] [phone]           Signed: Cathlyn Parsons 07/08/2018, 5:43 AM

## 2018-07-08 NOTE — Significant Event (Signed)
Pt. Refused his Lipitor,concerns about allergic reaction to statins.

## 2018-07-08 NOTE — Progress Notes (Signed)
Writer called to room, patient had c/o nausea. CBG obtained and results were 156. Vitals at this time are stabled. Resident A/O X 4. He is able to make his needs known and they are met. Zofran offered but refused at this time. No further c/o nausea at this time. Patient states he woke up and for about two minutes he felt nauseous, but no further complaints. Denies pain at this time. Charge nurse is aware and staff will continue to monitor for any changes.

## 2018-07-08 NOTE — Progress Notes (Signed)
[place] PHYSICAL MEDICINE & REHABILITATION PROGRESS NOTE   Subjective/Complaints: Ongoing left shoulder pain. Up ambulating in hall  ROS: Patient denies fever, rash, sore throat, blurred vision, nausea, vomiting, diarrhea, cough, shortness of breath or chest pain,   headache, or mood change.    Objective:   No results found. No results for input(s): WBC, HGB, HCT, PLT in the last 72 hours. No results for input(s): NA, K, CL, CO2, GLUCOSE, BUN, CREATININE, CALCIUM in the last 72 hours.  Intake/Output Summary (Last 24 hours) at 07/08/2018 0848 Last data filed at 07/08/2018 0755 Gross per 24 hour  Intake 1320 ml  Output 1700 ml  Net -380 ml     Physical Exam: Vital Signs Blood pressure (!) 168/79, pulse 66, temperature 98.4 F (36.9 C), temperature source Oral, resp. rate 16, height 5\' 8"  (1.727 m), weight 93.1 kg, SpO2 100 %. Constitutional: No distress . Vital signs reviewed. HEENT: EOMI, oral membranes moist Neck: supple Cardiovascular: RRR without murmur. No JVD    Respiratory: CTA Bilaterally without wheezes or rales. Normal effort    GI: BS +, non-tender, non-distended    Musculoskeletal:Normal range of motion.  General: +impingement signs left shoulder Neurological:a&o x 3.  Limited far left lateral vision.  Left hemifacial sensory loss. Also with hemisensory loss LUE and LLE. Decreased FMC LUE and LE. Motor 4+/5 RUE and RLE, 4 to 4+ LUE and LLE prox to distal. DTR's 2+--no motor and sensory changes today 4/27  Skin: Skin iswarmand dry.  Psychiatric: pleasant    Assessment/Plan: 1. Functional deficits secondary to right temporo-occipital infarct which require 3+ hours per day of interdisciplinary therapy in a comprehensive inpatient rehab setting.  Physiatrist is providing close team supervision and 24 hour management of active medical problems listed below.  Physiatrist and rehab team continue to assess barriers to discharge/monitor patient progress  toward functional and medical goals  Care Tool:  Bathing    Body parts bathed by patient: Right arm, Left upper leg, Left arm, Right lower leg, Chest, Abdomen, Left lower leg, Face, Front perineal area, Buttocks, Right upper leg         Bathing assist Assist Level: Independent with assistive device     Upper Body Dressing/Undressing Upper body dressing   What is the patient wearing?: Pull over shirt    Upper body assist Assist Level: Independent    Lower Body Dressing/Undressing Lower body dressing      What is the patient wearing?: Pants     Lower body assist Assist for lower body dressing: Independent with assitive device     Toileting Toileting    Toileting assist Assist for toileting: Independent with assistive device     Transfers Chair/bed transfer  Transfers assist     Chair/bed transfer assist level: Independent     Locomotion Ambulation   Ambulation assist      Assist level: Supervision/Verbal cueing Assistive device: Other (comment)(walking stick) Max distance: 173ft   Walk 10 feet activity   Assist     Assist level: Supervision/Verbal cueing Assistive device: Other (comment)   Walk 50 feet activity   Assist    Assist level: Supervision/Verbal cueing Assistive device: Other (comment)    Walk 150 feet activity   Assist    Assist level: Supervision/Verbal cueing Assistive device: Other (comment)    Walk 10 feet on uneven surface  activity   Assist Walk 10 feet on uneven surfaces activity did not occur: Safety/medical concerns   Assist level: Supervision/Verbal cueing Assistive device:  Other (comment)   Wheelchair     Assist Will patient use wheelchair at discharge?: No             Wheelchair 50 feet with 2 turns activity    Assist            Wheelchair 150 feet activity     Assist          Medical Problem List and Plan: 1.Left side weaknesssecondary to small acute infarct  mesial right temporal lobe and occipital lobe onset 06/28/2018 most likely due to atrial fibrillation -Continue CIR therapies including PT, OT    -ELOS 4/27 2. Antithrombotics: -DVT/anticoagulation:ELIQUIS -antiplatelet therapy: Aspirin 81 mg daily    3. Pain Management:Neurontin 100 mg 3 times a day  -kpad and sports rub for left shoulder, low back  -shoulder ROM exercises with PT/OT  -will inject left shoulder today  -shoulder xr ordered also 4. Mood:Provide emotional support -antipsychotic agents: N/A 5. Neuropsych: This patientiscapable of making decisions on hisown behalf. 6. Skin/Wound Care:Routine skin checks 7. Fluids/Electrolytes/Nutrition:encourage PO  -480 mL reported on 07/05/2018 8.CAD/CABG. Continue aspirin. No chest pain or shortness of breath 9. History of atrial fibrillation. Continue Eliquis Vitals:   07/07/18 1929 07/08/18 0229  BP: (!) 152/68 (!) 168/79  Pulse: 64 66  Resp: 16   Temp: 98.4 F (36.9 C) 98.4 F (36.9 C)  SpO2: 100% 100%  Rate well controlled, BP borderline---may be pain related 10. Diabetes mellitus with peripheral neuropathy. Hemoglobin A1c 12.Lantus insulin 18 units daily. NovoLog 12 units 3 times a day.  -some improvemet--avoid overtreating lows/highs  CBG (last 3)  Recent Labs    07/07/18 2107 07/08/18 0228 07/08/18 0621  GLUCAP 182* 156* 140*   11. Permissive hypertension. Monitor with increased mobility. Patient on Coreg 3.125 mg twice a day,Lasix 40 mg a.m. 2 tablets every p.m.    -fair control 12. Hyperlipidemia. Lipitor     LOS: 7 days A FACE TO FACE EVALUATION WAS PERFORMED  Meredith Staggers 07/08/2018, 8:48 AM

## 2018-07-08 NOTE — Progress Notes (Signed)
Occupational Therapy Discharge Summary  Patient Details  Name: Mark Cabrera MRN: [id number] Date of Birth: 1950/04/01   Patient has met 11 of 11 long term goals due to improved activity tolerance, improved balance, postural control, ability to compensate for deficits, functional use of  LEFT upper extremity and improved coordination.  Patient to discharge at overall Modified Independent level.  Patient's care partner is independent to provide the necessary physical assistance at discharge.  Pt planning to d/c to sister's home prior to d/c to his home independently. Pt performing ADLs and functional mobility at overall mod I level using walking stick for balance. Pt with severe B sensation deficits from prior and most recent CVA. Pt also with hx of vestibular deficits, able to self manage, however, with distractions can have LOB episodes. Pt with good awareness of deficits and functional implications.      Recommendation:  Patient will benefit from ongoing skilled OT services in home health setting to continue to advance functional skills in the area of BADL and Reduce care partner burden.  Equipment: Tub transfer bench  Reasons for discharge: treatment goals met and discharge from hospital  Patient/family agrees with progress made and goals achieved: Yes  OT Discharge Precautions/Restrictions  Precautions Precautions: Fall Precaution Comments: Prior R side deficits from prior CVA Restrictions Weight Bearing Restrictions: No Vision Baseline Vision/History: Wears glasses Wears Glasses: Distance only Patient Visual Report: No change from baseline Perception  Perception: Impaired Praxis Praxis: Intact Cognition Overall Cognitive Status: No family/caregiver present to determine baseline cognitive functioning Arousal/Alertness: Awake/alert Orientation Level: Oriented X4 Attention: Selective Selective Attention: Impaired Selective Attention Impairment: Verbal complex;Functional  complex Memory: Appears intact Awareness: Impaired Awareness Impairment: Anticipatory impairment Problem Solving: Appears intact Reasoning: Appears intact Behaviors: Impulsive Safety/Judgment: Impaired Comments: Poor safety awareness with decreased awareness of deficits and functional implications Sensation Sensation Light Touch: Impaired Detail Light Touch Impaired Details: Impaired LUE;Impaired RUE;Impaired RLE;Impaired LLE Proprioception: Impaired Detail Proprioception Impaired Details: Impaired LUE;Impaired LLE Coordination Gross Motor Movements are Fluid and Coordinated: No Fine Motor Movements are Fluid and Coordinated: No Coordination and Movement Description: 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  Motor Motor: Hemiplegia;Ataxia Motor - Skilled Clinical Observations: L hemi Trunk/Postural Assessment  Cervical Assessment Cervical Assessment: Within Functional Limits Thoracic Assessment Thoracic Assessment: Exceptions to WFL(Rounded shoulders; Kyphotic) Lumbar Assessment Lumbar Assessment: Exceptions to WFL(Posterior pelvic tilt) Postural Control Postural Control: Deficits on evaluation Righting Reactions: delayed  Balance Balance Balance Assessed: Yes Static Sitting Balance Static Sitting - Balance Support: No upper extremity supported;Feet supported Static Sitting - Level of Assistance: 6: Modified independent (Device/Increase time) Dynamic Sitting Balance Dynamic Sitting - Balance Support: No upper extremity supported;During functional activity;Feet supported Dynamic Sitting - Level of Assistance: 7: Independent Static Standing Balance Static Standing - Balance Support: During functional activity;No upper extremity supported Static Standing - Level of Assistance: 7: Independent Dynamic Standing Balance Dynamic Standing - Balance Support: During functional activity;Right upper extremity supported;Left upper extremity supported Dynamic Standing - Level of  Assistance: 6: Modified independent (Device/Increase time) Extremity/Trunk Assessment RUE Assessment RUE Assessment: Within Functional Limits Active Range of Motion (AROM) Comments: 4+/5 throughout LUE Assessment LUE Assessment: Exceptions to [facility] Active Range of Motion (AROM) Comments: ~100 degrees shoulder flexion 2/2 rotator cuff injury. AROM and PROM with pain past ~100 degrees General Strength Comments: 4/5 throughout LUE Body System: Neuro LUE AROM (degrees) Overall AROM Left Upper Extremity: Due to pain;Due to premorbid status(hx rotator cuff injury) LUE Strength LUE Overall Strength: Deficits  Jaelynn Pozo L 07/08/2018, 7:23 AM

## 2018-07-08 NOTE — Progress Notes (Signed)
Physical Therapy Discharge Summary  Patient Details  Name: Mark Cabrera MRN: [id number] Date of Birth: 05/05/1950  Today's Date: 07/08/2018 PT Individual Time: 0915-1030 PT Individual Time Calculation (min): 75 min    Patient has met 5 of 6 long term goals due to improved activity tolerance, improved balance, improved postural control, ability to compensate for deficits, improved attention, improved awareness and improved coordination.  Patient to discharge at an ambulatory level Modified Independent.   Patient's care partner is not necessary to provide assistance at discharge. Pt does plan to d/c to his sister's home until he feels comfortable on his own.  Reasons goals not met: Pt did not meet stair goal set at mod I. Pt requires Supervision for stairs to remember safe gait pattern and sequencing on stairs.  Recommendation:  Patient will benefit from ongoing skilled PT services in home health setting to continue to advance safe functional mobility, address ongoing impairments in balance, coordination, safety, and minimize fall risk.  Equipment: No equipment provided. Pt already owns a walking stick.  Reasons for discharge: treatment goals met and discharge from hospital  Patient/family agrees with progress made and goals achieved: Yes   Skilled Intervention: Pt received seated in recliner in room, agreeable to PT session. No complaints of pain. Pt transfers mod I with use of walking stick throughout session. Ambulation up to 200 ft with use of walking stick mod I. Car transfer mod I. Ascend/descend 12 stairs with 2 handrails and Supervision, v/c for safe gait pattern. Ambulation up/down ramp, across uneven surface, and up/down curb with walking stick and mod I. Berg Balance Test [phone] and DGI 18/27, ongoing fall risk and reviewed results with patient. Provided pt with HEP for balance training activities: side-stepping, backwards gait, tandem stance, Romberg stance, and alt L/R cone taps.  Pt left seated in recliner in room with needs in reach at end of session.  PT Discharge Precautions/Restrictions Precautions Precautions: Fall Precaution Comments: Prior R side deficits from prior CVA Restrictions Weight Bearing Restrictions: No Vision/Perception  Vision - History Baseline Vision: Other (comment) Patient Visual Report: Other (comment)(impaired depth perception since previous CVA) Perception Perception: Impaired Praxis Praxis: Intact  Cognition Arousal/Alertness: Awake/alert Orientation Level: Oriented X4 Attention: Selective Focused Attention: Appears intact Selective Attention: Impaired Memory: Appears intact Awareness: Impaired Awareness Impairment: Anticipatory impairment Problem Solving: Appears intact Behaviors: Impulsive Safety/Judgment: Impaired Comments: Poor safety awareness with decreased awareness of deficits and functional implications Sensation Sensation Light Touch: Impaired Detail Light Touch Impaired Details: Impaired LUE;Impaired RUE;Impaired RLE;Impaired LLE Hot/Cold: Impaired Detail Hot/Cold Impaired Details: Impaired RUE;Impaired RLE Proprioception: Impaired Detail Proprioception Impaired Details: Impaired LUE;Impaired LLE Coordination Gross Motor Movements are Fluid and Coordinated: No Fine Motor Movements are Fluid and Coordinated: No Coordination and Movement Description: 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  Motor Motor: Hemiplegia;Ataxia Motor - Skilled Clinical Observations: L hemi Motor - Discharge Observations: L hemi  Mobility Bed Mobility Bed Mobility: Rolling Right;Rolling Left;Supine to Sit;Sit to Supine Rolling Right: Independent Rolling Left: Independent Supine to Sit: Independent Sit to Supine: Independent Transfers Transfers: Sit to Stand;Stand to Sit;Stand Pivot Transfers Sit to Stand: Independent with assistive device Stand to Sit: Independent with assistive device Stand Pivot Transfers:  Independent with assistive device Transfer (Assistive device): Other (Comment)(walking stick) Locomotion  Gait Ambulation: Yes Gait Assistance: Independent with assistive device Gait Distance (Feet): 200 Feet Assistive device: Other (Comment)(walking stick) Gait Gait: Yes Gait Pattern: Impaired Gait Pattern: Decreased step length - right;Decreased step length - left;Ataxic;Poor foot  clearance - left;Shuffle Gait velocity: Decreased Stairs / Additional Locomotion Stairs: Yes Stairs Assistance: Supervision/Verbal cueing Stair Management Technique: Two rails;Alternating pattern;Step to pattern Number of Stairs: 12 Height of Stairs: 6 Ramp: Independent with assistive device Curb: Independent with assistive device Wheelchair Mobility Wheelchair Mobility: No  Trunk/Postural Assessment  Cervical Assessment Cervical Assessment: Within Functional Limits Thoracic Assessment Thoracic Assessment: Exceptions to WFL(rounded shoulders, kyphotic) Lumbar Assessment Lumbar Assessment: Exceptions to WFL(posterior pelvic tilt) Postural Control Postural Control: Deficits on evaluation Righting Reactions: delayed  Balance Balance Balance Assessed: Yes Standardized Balance Assessment Standardized Balance Assessment: Berg Balance Test;Dynamic Gait Index Berg Balance Test Sit to Stand: Able to stand without using hands and stabilize independently Standing Unsupported: Able to stand safely 2 minutes Sitting with Back Unsupported but Feet Supported on Floor or Stool: Able to sit safely and securely 2 minutes Stand to Sit: Sits safely with minimal use of hands Transfers: Able to transfer safely, minor use of hands Standing Unsupported with Eyes Closed: Able to stand 10 seconds with supervision Standing Ubsupported with Feet Together: Able to place feet together independently and stand for 1 minute with supervision From Standing, Reach Forward with Outstretched Arm: Can reach forward >12 cm safely  (5") From Standing Position, Pick up Object from Floor: Able to pick up shoe, needs supervision From Standing Position, Turn to Look Behind Over each Shoulder: Looks behind from both sides and weight shifts well Turn 360 Degrees: Needs close supervision or verbal cueing Standing Unsupported, Alternately Place Feet on Step/Stool: Able to complete >2 steps/needs minimal assist Standing Unsupported, One Foot in Front: Able to plae foot ahead of the other independently and hold 30 seconds Standing on One Leg: Able to lift leg independently and hold equal to or more than 3 seconds Total Score: 43 Dynamic Gait Index Level Surface: Mild Impairment Change in Gait Speed: Mild Impairment Gait with Horizontal Head Turns: Mild Impairment Gait with Vertical Head Turns: Mild Impairment Gait and Pivot Turn: Normal Step Over Obstacle: Mild Impairment Step Around Obstacles: Normal Steps: Mild Impairment Total Score: 18 Static Sitting Balance Static Sitting - Balance Support: No upper extremity supported;Feet supported Static Sitting - Level of Assistance: 6: Modified independent (Device/Increase time) Dynamic Sitting Balance Dynamic Sitting - Balance Support: No upper extremity supported;During functional activity;Feet supported Dynamic Sitting - Level of Assistance: 6: Modified independent (Device/Increase time) Static Standing Balance Static Standing - Balance Support: During functional activity;No upper extremity supported Static Standing - Level of Assistance: 6: Modified independent (Device/Increase time) Dynamic Standing Balance Dynamic Standing - Balance Support: During functional activity;Right upper extremity supported;Left upper extremity supported Dynamic Standing - Level of Assistance: 6: Modified independent (Device/Increase time) Extremity Assessment   RLE Assessment RLE Assessment: Within Functional Limits Passive Range of Motion (PROM) Comments: WFL General Strength Comments: 5/5  grossly LLE Assessment LLE Assessment: Within Functional Limits Passive Range of Motion (PROM) Comments: [facility] General Strength Comments: improved, see below LLE Strength Left Hip Flexion: 4+/5 Left Knee Flexion: 4/5 Left Knee Extension: 4/5 Left Ankle Dorsiflexion: 4/5     Excell Seltzer, PT, DPT 07/08/2018, 1:17 PM

## 2018-07-09 ENCOUNTER — Other Ambulatory Visit: Payer: Self-pay

## 2018-07-09 DIAGNOSIS — I639 Cerebral infarction, unspecified: Secondary | ICD-10-CM

## 2018-07-09 LAB — GLUCOSE, CAPILLARY: Glucose-Capillary: 130 mg/dL — ABNORMAL HIGH (ref 70–99)

## 2018-07-09 MED ORDER — APIXABAN 5 MG PO TABS: 5.0000 mg | ORAL_TABLET | Freq: Two times a day (BID) | ORAL | 0 refills | Status: DC

## 2018-07-09 MED ORDER — INSULIN ASPART 100 UNIT/ML FLEXPEN: 12.0000 [IU] | PEN_INJECTOR | Freq: Three times a day (TID) | SUBCUTANEOUS | 11 refills | Status: DC

## 2018-07-09 MED ORDER — INSULIN GLARGINE 100 UNIT/ML SOLOSTAR PEN: 18.0000 [IU] | PEN_INJECTOR | Freq: Two times a day (BID) | SUBCUTANEOUS | 11 refills | Status: DC

## 2018-07-09 MED ORDER — ATORVASTATIN CALCIUM 80 MG PO TABS: 80.0000 mg | ORAL_TABLET | Freq: Every day | ORAL | 0 refills | Status: DC

## 2018-07-09 MED ORDER — GABAPENTIN 100 MG PO CAPS: 100.0000 mg | ORAL_CAPSULE | Freq: Three times a day (TID) | ORAL | 2 refills | Status: DC

## 2018-07-09 NOTE — Progress Notes (Signed)
Pt. received all medication/discharge instructions, as well as sister [name]. Pt. Is now ready to discharge home.

## 2018-07-09 NOTE — Progress Notes (Signed)
Social Work Discharge Note  The overall goal for the admission was met for:   Discharge location: Yes - home to pt's sister's home  Length of Stay: Yes - 8 days  Discharge activity level: Yes - modified independent with supervision for stairs  Home/community participation: Yes  Services provided included: MD, RD, PT, OT, RN, Pharmacy and [place]: Medicare and Medicaid  Follow-up services arranged: Home Health: PT/OT from [facility], DME: tub transfer bench from [place] and Patient/Family has no preference for HH/DME agencies  Comments (or additional information):   Patient/Family verbalized understanding of follow-up arrangements: Yes  Individual responsible for coordination of the follow-up plan: pt and his sister, Zachery Dakins [phone]  Confirmed correct DME delivered: Trey Sailors 07/09/2018    Yanci Bachtell, Silvestre Mesi

## 2018-07-09 NOTE — Progress Notes (Signed)
[facility] PHYSICAL MEDICINE & REHABILITATION PROGRESS NOTE   Subjective/Complaints: Shoulder still "sore" but much improved from yesterday  ROS: Patient denies fever, rash, sore throat, blurred vision, nausea, vomiting, diarrhea, cough, shortness of breath or chest pain, joint or back pain, headache, or mood change.   Objective:   Dg Shoulder Left  Result Date: 07/08/2018 CLINICAL DATA:  Left shoulder pain no injury EXAM: LEFT SHOULDER - 2+ VIEW COMPARISON:  None. FINDINGS: Normal alignment no fracture Mild degenerative change glenohumeral joint and AC joint. Soft tissue calcification in the region of the rotator cuff compatible with calcific tendinitis. Mild irregularity of the greater trochanter due to chronic tendinitis. Prior heart surgery. IMPRESSION: Mild degenerative change in the left shoulder and AC joint. Calcific tendinitis rotator cuff. Electronically Signed   By: Franchot Gallo M.D.   On: 07/08/2018 11:26   No results for input(s): WBC, HGB, HCT, PLT in the last 72 hours. No results for input(s): NA, K, CL, CO2, GLUCOSE, BUN, CREATININE, CALCIUM in the last 72 hours.  Intake/Output Summary (Last 24 hours) at 07/09/2018 0857 Last data filed at 07/09/2018 0800 Gross per 24 hour  Intake 595 ml  Output 1425 ml  Net -830 ml     Physical Exam: Vital Signs Blood pressure (!) 148/78, pulse 63, temperature 97.9 F (36.6 C), temperature source Oral, resp. rate 18, height 5\' 8"  (1.727 m), weight 93.1 kg, SpO2 100 %. Constitutional: No distress . Vital signs reviewed. HEENT: EOMI, oral membranes moist Neck: supple Cardiovascular: RRR without murmur. No JVD    Respiratory: CTA Bilaterally without wheezes or rales. Normal effort    GI: BS +, non-tender, non-distended  heezes or rales. Normal effort    GI: BS +, non-tender, non-distended    Musculoskeletal:Normal range of motion.  General: +impingement signs left shoulder Neurological:a&o x 3.  Limited far left  lateral vision.  left sensory deficits. --no motor and sensory changes today  4/28  Skin: Skin iswarmand dry.  Psychiatric: pleasant    Assessment/Plan: 1. Functional deficits secondary to right temporo-occipital infarct which require 3+ hours per day of interdisciplinary therapy in a comprehensive inpatient rehab setting.  Physiatrist is providing close team supervision and 24 hour management of active medical problems listed below.  Physiatrist and rehab team continue to assess barriers to discharge/monitor patient progress toward functional and medical goals  Care Tool:  Bathing    Body parts bathed by patient: Right arm, Left upper leg, Left arm, Right lower leg, Chest, Abdomen, Left lower leg, Face, Front perineal area, Buttocks, Right upper leg         Bathing assist Assist Level: Independent with assistive device     Upper Body Dressing/Undressing Upper body dressing   What is the patient wearing?: Pull over shirt    Upper body assist Assist Level: Independent    Lower Body Dressing/Undressing Lower body dressing      What is the patient wearing?: Pants     Lower body assist Assist for lower body dressing: Independent with assitive device     Toileting Toileting    Toileting assist Assist for toileting: Independent with assistive device     Transfers Chair/bed transfer  Transfers assist     Chair/bed transfer assist level: Independent with assistive device     Locomotion Ambulation   Ambulation assist      Assist level: Independent with assistive device Assistive device: Other (comment)(walking stick) Max distance: 200'   Walk 10 feet activity   Assist  Assist level: Independent with assistive device Assistive device: Other (comment)   Walk 50 feet activity   Assist    Assist level: Independent with assistive device Assistive device: Other (comment)    Walk 150 feet activity   Assist    Assist level: Independent  with assistive device Assistive device: Other (comment)    Walk 10 feet on uneven surface  activity   Assist Walk 10 feet on uneven surfaces activity did not occur: Safety/medical concerns   Assist level: Independent with assistive device Assistive device: Other (comment)   Wheelchair     Assist Will patient use wheelchair at discharge?: No             Wheelchair 50 feet with 2 turns activity    Assist            Wheelchair 150 feet activity     Assist          Medical Problem List and Plan: 1.Left side weaknesssecondary to small acute infarct mesial right temporal lobe and occipital lobe onset 06/28/2018 most likely due to atrial fibrillation     -dc home today  -Patient to see Rehab MD/provider in the office for transitional care encounter in 2-4 weeks.  2. Antithrombotics: -DVT/anticoagulation:ELIQUIS -antiplatelet therapy: Aspirin 81 mg daily    3. Pain Management:Neurontin 100 mg 3 times a day  -kpad and sports rub for left shoulder, low back  -shoulder ROM exercises with PT/OT  -left shoulder injection with good results so far  -shoulder xr reviewed with patient 4. Mood:Provide emotional support -antipsychotic agents: N/A 5. Neuropsych: This patientiscapable of making decisions on hisown behalf. 6. Skin/Wound Care:Routine skin checks 7. Fluids/Electrolytes/Nutrition:    8.CAD/CABG. Continue aspirin. No chest pain or shortness of breath 9. History of atrial fibrillation. Continue Eliquis Vitals:   07/08/18 1959 07/09/18 0509  BP: 140/70 (!) 148/78  Pulse: 78 63  Resp: 19 18  Temp: 98.2 F (36.8 C) 97.9 F (36.6 C)  SpO2: 99% 100%  Rate well controlled, BP borderline---may be pain related 10. Diabetes mellitus with peripheral neuropathy. Hemoglobin A1c 12.Lantus insulin 18 units daily. NovoLog 12 units 3 times a day.  -improved control  CBG (last 3)  Recent Labs     07/08/18 1706 07/08/18 1957 07/09/18 0622  GLUCAP 154* 181* 130*   11. Permissive hypertension. Monitor with increased mobility. Patient on Coreg 3.125 mg twice a day,Lasix 40 mg a.m. 2 tablets every p.m.    -fair control --follow up as outpt 12. Hyperlipidemia. Lipitor     LOS: 8 days A FACE TO FACE EVALUATION WAS PERFORMED  Meredith Staggers 07/09/2018, 8:57 AM

## 2018-07-09 NOTE — Consult Note (Signed)
Spoke with patient via cell phone, HIPAA verified.  Patient is in agreement for [facility] post hospital follow up for referral for assistance with Diabetes management.  Patient also noted for Stroke.  Patient states he will be living with sister.  He currently has needs at the barn apartment he was living in in [place].  Patient has been assigned to [facility] for follow up.  For questions, please contact:  Natividad Brood, RN BSN [facility] Liaison  [phone] business mobile phone Toll free office [phone]

## 2018-07-09 NOTE — Progress Notes (Signed)
Slept well throughout the night. No complaints noted. Blood glucose-130 this am.

## 2018-07-10 ENCOUNTER — Other Ambulatory Visit: Payer: Self-pay

## 2018-07-10 MED ORDER — INSULIN LISPRO (1 UNIT DIAL) 100 UNIT/ML (KWIKPEN): 12.0000 [IU] | PEN_INJECTOR | Freq: Three times a day (TID) | SUBCUTANEOUS | 11 refills | Status: DC

## 2018-07-10 NOTE — Telephone Encounter (Signed)
Pt's sister requesting to speak with a nurse about meds. Please call back.

## 2018-07-10 NOTE — Telephone Encounter (Signed)
I spoke with Mark Cabrera sister, Mark Cabrera, she reports that Mark Cabrera has plenty of Humalog and cannot afford the Novolog. She was educated that he can take same dose of Humalog that was ordered for the Novolog. She verbalized understanding by repeating back instructions and correct dose per  recent discharge instructions.  Suggest prescription be changed to Humalog kwickpen 12 units into the skin 3 times a day with meals. [name] Plyler, RD 07/10/2018 10:50 AM.

## 2018-07-10 NOTE — Telephone Encounter (Signed)
Tried calling pt but had to leave a voicemail. I will be in clinic tomorrow to try to help with patient's meds.

## 2018-07-10 NOTE — Telephone Encounter (Signed)
Return call from pt's sister - stated Humulin was changed to Novolog which cost $700.00. Requesting something else. Stated pt's BS was 136 this morning. And she's concern he was taken off his HTN medication. Uses [facility] on [address]. Her phone# [phone] Thanks

## 2018-07-10 NOTE — Telephone Encounter (Signed)
Talked to pt's sister, Beverlee Nims [phone]) informed Mannie Stabile had called pt. She has questions about what to do about pt's insulin until tomorrow - called transferred to [facility]

## 2018-07-10 NOTE — Telephone Encounter (Signed)
I changed this to humalog. Thank you

## 2018-07-10 NOTE — Telephone Encounter (Signed)
Pease have him follow up for a tele visit so we can address their concerns. Thank you

## 2018-07-10 NOTE — Telephone Encounter (Signed)
Thank you :)

## 2018-07-10 NOTE — Telephone Encounter (Signed)
Thanks [employer]. He has HFU tele health appt schedule for tomorrow.

## 2018-07-11 ENCOUNTER — Encounter: Payer: Medicare Other | Attending: Registered Nurse | Admitting: Registered Nurse

## 2018-07-11 ENCOUNTER — Ambulatory Visit (INDEPENDENT_AMBULATORY_CARE_PROVIDER_SITE_OTHER): Payer: Medicare Other | Admitting: Internal Medicine

## 2018-07-11 ENCOUNTER — Other Ambulatory Visit: Payer: Self-pay

## 2018-07-11 ENCOUNTER — Encounter: Payer: Self-pay | Admitting: Registered Nurse

## 2018-07-11 VITALS — BP 134/70 | HR 68 | Temp 98.1°F | Ht 68.0 in | Wt 210.7 lb

## 2018-07-11 DIAGNOSIS — Z79899 Other long term (current) drug therapy: Secondary | ICD-10-CM

## 2018-07-11 DIAGNOSIS — Z7982 Long term (current) use of aspirin: Secondary | ICD-10-CM | POA: Diagnosis not present

## 2018-07-11 DIAGNOSIS — Z8679 Personal history of other diseases of the circulatory system: Secondary | ICD-10-CM | POA: Diagnosis not present

## 2018-07-11 DIAGNOSIS — Z8673 Personal history of transient ischemic attack (TIA), and cerebral infarction without residual deficits: Secondary | ICD-10-CM | POA: Diagnosis not present

## 2018-07-11 DIAGNOSIS — E1140 Type 2 diabetes mellitus with diabetic neuropathy, unspecified: Secondary | ICD-10-CM

## 2018-07-11 DIAGNOSIS — E1149 Type 2 diabetes mellitus with other diabetic neurological complication: Secondary | ICD-10-CM | POA: Diagnosis not present

## 2018-07-11 DIAGNOSIS — E1142 Type 2 diabetes mellitus with diabetic polyneuropathy: Secondary | ICD-10-CM

## 2018-07-11 DIAGNOSIS — I639 Cerebral infarction, unspecified: Secondary | ICD-10-CM

## 2018-07-11 DIAGNOSIS — I119 Hypertensive heart disease without heart failure: Secondary | ICD-10-CM

## 2018-07-11 DIAGNOSIS — E785 Hyperlipidemia, unspecified: Secondary | ICD-10-CM

## 2018-07-11 DIAGNOSIS — Z794 Long term (current) use of insulin: Secondary | ICD-10-CM

## 2018-07-11 DIAGNOSIS — Z7901 Long term (current) use of anticoagulants: Secondary | ICD-10-CM | POA: Diagnosis not present

## 2018-07-11 DIAGNOSIS — I6300 Cerebral infarction due to thrombosis of unspecified precerebral artery: Secondary | ICD-10-CM

## 2018-07-11 DIAGNOSIS — I480 Paroxysmal atrial fibrillation: Secondary | ICD-10-CM | POA: Diagnosis not present

## 2018-07-11 NOTE — Patient Instructions (Signed)
Thank you for allowing [place] to provide your care. Today we're gonna be checking some urine to determine what type of blood pressure medicine to place you on. I will call you tomorrow to let you know which medication to start.  Continue all your other medications as described. I would like you to come back in four weeks for some additional blood work and a blood pressure check.  Continue to avoid high sugar foods and take your insulin as prescribed. If you need anything please do not hesitate to call the clinic.

## 2018-07-11 NOTE — Assessment & Plan Note (Addendum)
HPI: Patient simply admitted for CVA. It will be important to control his diabetes, hypertension, and hyperlipidemia. He was started on atorvastatin. 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 He is not having any myalgias at this point. He has some left upper and left lower extremity weakness and decreased ability for fine motor movements. He is holding his arm in the flex position. It will be important for him to have PT OT to prevent contractures.  A/P: - BP goal <130/80  - DM goal <7.0 - Continue atorvastatin, recheck lipid panel in 3 months  - Needs home health PT/OT

## 2018-07-11 NOTE — Assessment & Plan Note (Signed)
HPI:  Patient with hyperlipidemia and recent CVA. He has been started on atorvastatin 80 mg daily. He has a history of statin induced myopathy. No myalgias currently. Will need to monitor closely.  A/P - Continue atorvastatin 80 mg QD  - Repeat lipid panel in 3 months

## 2018-07-11 NOTE — Progress Notes (Signed)
Transitional Care call  Patient name: Mark Cabrera  DOB: 07-04-1950 1. Are you/is patient experiencing any problems since coming home? No a. Are there any questions regarding any aspect of care? No 2. Are there any questions regarding medications administration/dosing? No a. Are meds being taken as prescribed? Yes b. "Patient should review meds with caller to confirm" Medication List Reviewed 3. Have there been any falls? No 4. Has Home Health been to the house and/or have they contacted you? [facility] is scheduled to come to his home on  07/12/2018 a. If not, have you tried to contact them? NA b. Can we help you contact them? No 5. Are bowels and bladder emptying properly? Yes a. Are there any unexpected incontinence issues? No b. If applicable, is patient following bowel/bladder programs? NA 6. Any fevers, problems with breathing, unexpected pain? No 7. Are there any skin problems or new areas of breakdown? No 8. Has the patient/family member arranged specialty MD follow up (ie cardiology/neurology/renal/surgical/etc.)?  HFU appointments are scheduled he reports.  a. Can we help arrange? NA 9. Does the patient need any other services or support that we can help arrange? No 10. Are caregivers following through as expected in assisting the patient? Yes 11. Has the patient quit smoking, drinking alcohol, or using drugs as recommended? (                        )  Appointment date/time 07/16/2018  arrival time 11:00 for 11:20 appointment with Dr. Naaman Plummer. [place]

## 2018-07-11 NOTE — Assessment & Plan Note (Signed)
HPI:  Patient with uncontrolled diabetes. A1c went from 7.7 to 12.6. He is now living with his sister and his diet is more consistent. He is on Lantus 18 units BID, and Humalog 12 units three times daily. His sugars have been well controlled on this regiment. Continue to encourage diet and exercise.  A/P: - Checking urine microalbumin  - On statin therapy  - Continue lantus 18 units BID and humalog 12 units TID  - Consider restarting metformin or an SGLT-2 inhibitor in the future

## 2018-07-11 NOTE — Progress Notes (Signed)
   CC: CVA, HTN, PAF, HLD  HPI:  [name] is a 68 y.o. male with PMHx listed below presenting for CVA. Please see the A&P for the status of the patient's chronic medical problems.  Past Medical History:  Diagnosis Date  . Acute diastolic congestive heart failure ([place])   . Aortic valve stenosis s/p AVR 2018  . Atrial fibrillation ([place]) - post-op CABG    04/2016 CHA2DS2VAS score = 5  . Atypical nevi   . Coronary artery disease s/p 2 vessel CABG   . Depression    "years ago"  . Diabetes mellitus   . Dyspnea    in the past   . GERD (gastroesophageal reflux disease)   . Heart murmur   . Hepatic cirrhosis ([place]) 06/29/2017  . History of kidney stones   . Hyperlipidemia    hx of transaminitis secondary to statin and he has decided not to use statins secondary to potential side effects.  . Hypertension   . Nephrolithiasis   . Osteoarthritis, knee   . Sleep apnea     Central apnea. Not using cpap  . Stroke [facility]) 2007  . Transaminitis     Statin-induced  . 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 2-D echo was normal no embolic source found.   Review of Systems:  Performed and all others negative.  Physical Exam: Vitals:   07/11/18 0911  BP: 134/70  Pulse: 68  Temp: 98.1 F (36.7 C)  TempSrc: Oral  SpO2: 100%   General: Well nourished male in no acute distress Pulm: Good air movement with no wheezing or crackles  CV: RRR, no murmurs, no rubs  Neuro: Alert and oriented x 3, decreased sensation to the left face, LUE, LLE. Gross strength 4/5 in the LUE/LLE. Holding LUE in flexion position.   Assessment & Plan:   See Encounters Tab for problem based charting.  Patient discussed with Dr.  Dareen Piano

## 2018-07-11 NOTE — Assessment & Plan Note (Addendum)
HPI:  Patient noted to have hypertension. Previously was on enalapril and hydrochlorothiazide. These were stopped after his most recent CVA. His blood pressure today is 134/70.  A/P: - Check urine microalbumin to determine if patient would benefit from ACE. If negative will restart HCTZ  ADDENDUM: Urine microalbumin negative. Will start HCTZ 12.5 mg QD with further titration as needed for a goal BP of <130/80.

## 2018-07-11 NOTE — Assessment & Plan Note (Signed)
HPI:  Patient with recent admission for CVA. Felt to be most likely cardio embolic from paroxysmal atrial fibrillation. He was started on Eliquis. He is tolerating this medication well. No evidence of bleeding. His in sinus rhythm today.  A/P: - Continue Eliquis

## 2018-07-11 NOTE — Progress Notes (Signed)
Internal Medicine Clinic Attending  Case discussed with Dr. Helberg at the time of the visit.  We reviewed the resident's history and exam and pertinent patient test results.  I agree with the assessment, diagnosis, and plan of care documented in the resident's note.    

## 2018-07-12 DIAGNOSIS — I2510 Atherosclerotic heart disease of native coronary artery without angina pectoris: Secondary | ICD-10-CM | POA: Diagnosis not present

## 2018-07-12 DIAGNOSIS — M75121 Complete rotator cuff tear or rupture of right shoulder, not specified as traumatic: Secondary | ICD-10-CM | POA: Diagnosis not present

## 2018-07-12 DIAGNOSIS — Z951 Presence of aortocoronary bypass graft: Secondary | ICD-10-CM | POA: Diagnosis not present

## 2018-07-12 DIAGNOSIS — I69354 Hemiplegia and hemiparesis following cerebral infarction affecting left non-dominant side: Secondary | ICD-10-CM | POA: Diagnosis not present

## 2018-07-12 DIAGNOSIS — I4891 Unspecified atrial fibrillation: Secondary | ICD-10-CM | POA: Diagnosis not present

## 2018-07-12 DIAGNOSIS — Z7901 Long term (current) use of anticoagulants: Secondary | ICD-10-CM | POA: Diagnosis not present

## 2018-07-12 DIAGNOSIS — I5031 Acute diastolic (congestive) heart failure: Secondary | ICD-10-CM | POA: Diagnosis not present

## 2018-07-12 DIAGNOSIS — K219 Gastro-esophageal reflux disease without esophagitis: Secondary | ICD-10-CM | POA: Diagnosis not present

## 2018-07-12 DIAGNOSIS — I639 Cerebral infarction, unspecified: Secondary | ICD-10-CM | POA: Diagnosis not present

## 2018-07-12 DIAGNOSIS — G4730 Sleep apnea, unspecified: Secondary | ICD-10-CM | POA: Diagnosis not present

## 2018-07-12 DIAGNOSIS — E1142 Type 2 diabetes mellitus with diabetic polyneuropathy: Secondary | ICD-10-CM | POA: Diagnosis not present

## 2018-07-12 DIAGNOSIS — M179 Osteoarthritis of knee, unspecified: Secondary | ICD-10-CM | POA: Diagnosis not present

## 2018-07-12 DIAGNOSIS — Z794 Long term (current) use of insulin: Secondary | ICD-10-CM | POA: Diagnosis not present

## 2018-07-12 DIAGNOSIS — Z952 Presence of prosthetic heart valve: Secondary | ICD-10-CM | POA: Diagnosis not present

## 2018-07-12 DIAGNOSIS — F329 Major depressive disorder, single episode, unspecified: Secondary | ICD-10-CM | POA: Diagnosis not present

## 2018-07-12 DIAGNOSIS — I110 Hypertensive heart disease with heart failure: Secondary | ICD-10-CM | POA: Diagnosis not present

## 2018-07-12 DIAGNOSIS — E785 Hyperlipidemia, unspecified: Secondary | ICD-10-CM | POA: Diagnosis not present

## 2018-07-12 LAB — MICROALBUMIN / CREATININE URINE RATIO
Creatinine, Urine: 119.3 mg/dL
Microalb/Creat Ratio: 7 mg/g creat (ref 0–29)
Microalbumin, Urine: 8.7 ug/mL

## 2018-07-12 MED ORDER — HYDROCHLOROTHIAZIDE 12.5 MG PO TABS: 12.5000 mg | ORAL_TABLET | Freq: Every day | ORAL | 1 refills | Status: DC

## 2018-07-12 NOTE — Addendum Note (Signed)
Addended by: [name] T on: 07/12/2018 01:02 PM   Modules accepted: Orders

## 2018-07-16 ENCOUNTER — Encounter: Payer: Self-pay | Admitting: Physical Medicine & Rehabilitation

## 2018-07-16 ENCOUNTER — Encounter: Payer: Medicare Other | Attending: Physical Medicine & Rehabilitation | Admitting: Physical Medicine & Rehabilitation

## 2018-07-16 ENCOUNTER — Other Ambulatory Visit: Payer: Self-pay

## 2018-07-16 VITALS — BP 147/88 | HR 66 | Temp 97.7°F | Ht 68.0 in | Wt 207.0 lb

## 2018-07-16 DIAGNOSIS — G4731 Primary central sleep apnea: Secondary | ICD-10-CM | POA: Diagnosis not present

## 2018-07-16 DIAGNOSIS — Z8673 Personal history of transient ischemic attack (TIA), and cerebral infarction without residual deficits: Secondary | ICD-10-CM | POA: Diagnosis not present

## 2018-07-16 DIAGNOSIS — Z7982 Long term (current) use of aspirin: Secondary | ICD-10-CM | POA: Diagnosis not present

## 2018-07-16 DIAGNOSIS — I639 Cerebral infarction, unspecified: Secondary | ICD-10-CM | POA: Diagnosis not present

## 2018-07-16 DIAGNOSIS — I110 Hypertensive heart disease with heart failure: Secondary | ICD-10-CM | POA: Diagnosis not present

## 2018-07-16 DIAGNOSIS — Z952 Presence of prosthetic heart valve: Secondary | ICD-10-CM | POA: Insufficient documentation

## 2018-07-16 DIAGNOSIS — I4891 Unspecified atrial fibrillation: Secondary | ICD-10-CM | POA: Insufficient documentation

## 2018-07-16 DIAGNOSIS — I10 Essential (primary) hypertension: Secondary | ICD-10-CM | POA: Insufficient documentation

## 2018-07-16 DIAGNOSIS — Z8249 Family history of ischemic heart disease and other diseases of the circulatory system: Secondary | ICD-10-CM | POA: Insufficient documentation

## 2018-07-16 DIAGNOSIS — Z833 Family history of diabetes mellitus: Secondary | ICD-10-CM | POA: Insufficient documentation

## 2018-07-16 DIAGNOSIS — Z8679 Personal history of other diseases of the circulatory system: Secondary | ICD-10-CM

## 2018-07-16 DIAGNOSIS — Z951 Presence of aortocoronary bypass graft: Secondary | ICD-10-CM | POA: Insufficient documentation

## 2018-07-16 DIAGNOSIS — I2510 Atherosclerotic heart disease of native coronary artery without angina pectoris: Secondary | ICD-10-CM | POA: Diagnosis not present

## 2018-07-16 DIAGNOSIS — Z7901 Long term (current) use of anticoagulants: Secondary | ICD-10-CM | POA: Diagnosis not present

## 2018-07-16 DIAGNOSIS — I5031 Acute diastolic (congestive) heart failure: Secondary | ICD-10-CM | POA: Diagnosis not present

## 2018-07-16 DIAGNOSIS — E1142 Type 2 diabetes mellitus with diabetic polyneuropathy: Secondary | ICD-10-CM | POA: Insufficient documentation

## 2018-07-16 DIAGNOSIS — R531 Weakness: Secondary | ICD-10-CM | POA: Insufficient documentation

## 2018-07-16 DIAGNOSIS — I25810 Atherosclerosis of coronary artery bypass graft(s) without angina pectoris: Secondary | ICD-10-CM | POA: Insufficient documentation

## 2018-07-16 DIAGNOSIS — I69354 Hemiplegia and hemiparesis following cerebral infarction affecting left non-dominant side: Secondary | ICD-10-CM | POA: Diagnosis not present

## 2018-07-16 NOTE — Progress Notes (Signed)
Subjective:    Patient ID: Mark Cabrera, male    DOB: Apr 14, 1950, 68 y.o.   MRN: [id number]  HPI   Mr. Coryell is here in follow up as a TC visit for his recent right temporal-occipital infarcts and inpatient rehab stay. He was discharged home on 4/28. Therapy has not yet come out to the house although he saw a case manager. In lieu of that he's been working on HEP. He is walking with a straight cane. He denies falls or mishaps. His left side is still numb. Left shoulder is sore but not as painful as it was prior to the injection.  He remains on gabapentin for dysethesias. He feels the numbness is better in his left face.   His sugars have been running close to 200 on a routine. He is taking 18u lantus bid with 12u humalog AFTER meals.   His sleep has been fair. Sister reports that he may get confused later in the day. He didn't mention any cocnern about this today. [place] the quality of his sleep at times.       Pain Inventory Average Pain 0 Pain Right Now 0 My pain is na  In the last 24 hours, has pain interfered with the following? General activity 0 Relation with others 0 Enjoyment of life 0 What TIME of day is your pain at its worst? na Sleep (in general) Good  Pain is worse with: some activites Pain improves with: na Relief from Meds: na  Mobility use a cane ability to climb steps?  yes  Function disabled: date disabled . I need assistance with the following:  meal prep and shopping  Neuro/Psych weakness numbness tingling trouble walking dizziness confusion  Prior Studies Any changes since last visit?  no  Physicians involved in your care Any changes since last visit?  no   Family History  Problem Relation Age of Onset  . Hypertension Mother   . Diabetes Mother   . Alcohol abuse Father    Social History   Socioeconomic History  . Marital status: Divorced    Spouse name: Not on file  . Number  of children: 1  . Years of education: 2y college  . Highest education level: Not on file  Occupational History  . Occupation:  Medicaid [place]    Employer: UNEMPLOYED    Comment: following stroke in 2007  Social Needs  . Financial resource strain: Not hard at all  . Food insecurity:    Worry: Never true    Inability: Never true  . Transportation needs:    Medical: No    Non-medical: No  Tobacco Use  . Smoking status: Never Smoker  . Smokeless tobacco: Never Used  Substance and Sexual Activity  . Alcohol use: Yes    Alcohol/week: 1.0 standard drinks    Types: 1 Cans of beer per week    Comment: occasional  . Drug use: No  . Sexual activity: Not Currently  Lifestyle  . Physical activity:    Days per week: 0 days    Minutes per session: 0 min  . Stress: Only a little  Relationships  . Social connections:    Talks on phone: More than three times a week    Gets together: Once a week    Attends religious service: Never    Active member of club or organization: No    Attends meetings of clubs or organizations: Never  Relationship status: Not on file  Other Topics Concern  . Not on file  Social History Narrative   Single but has a girlfriend.    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       Patient has medications disability post stroke.            Past Surgical History:  Procedure Laterality Date  . AORTIC VALVE REPLACEMENT N/A 03/30/2016   Procedure: AORTIC VALVE REPLACEMENT (AVR);  Surgeon: [name], MD;  Location: [place];  Service: Open Heart Surgery;  Laterality: N/A;  . CARDIAC CATHETERIZATION N/A 03/15/2016   Procedure: Right/Left Heart Cath and Coronary Angiography;  Surgeon: Burnell Blanks, MD;  Location: [place];  Service: Cardiovascular;  Laterality: N/A;  . CLIPPING OF ATRIAL APPENDAGE N/A 03/30/2016   Procedure: CLIPPING OF LEFT ATRIAL APPENDAGE;  Surgeon: [name], MD;   Location: [place];  Service: Open Heart Surgery;  Laterality: N/A;  . CORONARY ARTERY BYPASS GRAFT N/A 03/30/2016   Procedure: CORONARY ARTERY BYPASS GRAFTING (CABG) Times Two;  Surgeon: [name], MD;  Location: [place];  Service: Open Heart Surgery;  Laterality: N/A;  . KNEE ARTHROSCOPY W/ PARTIAL MEDIAL MENISCECTOMY  05/12/2005   right, performed by Dr. French Ana for torn medial meniscus.  Marland Kitchen ROTATOR CUFF REPAIR Right 2016  . STERNAL WIRES REMOVAL N/A 08/13/2017   Procedure: STERNAL WIRES REMOVAL;  Surgeon: [name], MD;  Location: [place];  Service: Thoracic;  Laterality: N/A;  . SUBXYPHOID PERICARDIAL WINDOW N/A 04/21/2016   Procedure: SUBXYPHOID PERICARDIAL WINDOW;  Surgeon: [name], MD;  Location: [place];  Service: Thoracic;  Laterality: N/A;  . TEE WITHOUT CARDIOVERSION N/A 03/30/2016   Procedure: TRANSESOPHAGEAL ECHOCARDIOGRAM (TEE);  Surgeon: [name], MD;  Location: [place];  Service: Open Heart Surgery;  Laterality: N/A;  . TEE WITHOUT CARDIOVERSION N/A 04/21/2016   Procedure: TRANSESOPHAGEAL ECHOCARDIOGRAM (TEE);  Surgeon: [name], MD;  Location: [place];  Service: Thoracic;  Laterality: N/A;   Past Medical History:  Diagnosis Date  . Acute diastolic congestive heart failure ([place])   . Aortic valve stenosis s/p AVR 2018  . Atrial fibrillation ([place]) - post-op CABG    04/2016 CHA2DS2VAS score = 5  . Atypical nevi   . Coronary artery disease s/p 2 vessel CABG   . Depression    "years ago"  . Diabetes mellitus   . Dyspnea    in the past   . GERD (gastroesophageal reflux disease)   . Heart murmur   . Hepatic cirrhosis ([place]) 06/29/2017  . History of kidney stones   . Hyperlipidemia    hx of transaminitis secondary to statin and he has decided not to use statins secondary to potential side effects.  . Hypertension   . Nephrolithiasis   . Osteoarthritis, knee   . Sleep apnea     Central apnea. Not using cpap  . Stroke [facility]) 2007  .  Transaminitis     Statin-induced  . 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 2-D echo was normal no embolic source found.   BP (!) 147/88   Pulse 66   Temp 97.7 F (36.5 C)  Ht 5\' 8"  (1.727 m)   Wt 207 lb (93.9 kg)   SpO2 98%   BMI 31.47 kg/m   Opioid Risk Score:   Fall Risk Score:  `1  Depression screen PHQ 2/9  Depression screen [facility] 2/9 07/11/2018 04/04/2018 12/13/2017 06/28/2017 03/28/2017 02/28/2017 02/14/2017  Decreased Interest 1 - 1 0 0 0 0  Down, Depressed, Hopeless 0 0 1 0 0 0 0  PHQ - 2 Score 1 0 2 0 0 0 0  Altered sleeping 0 0 3 - - - -  Tired, decreased energy 1 1 3  - - - -  Change in appetite 0 0 0 - - - -  Feeling bad or failure about yourself  0 0 0 - - - -  Trouble concentrating 1 1 0 - - - -  Moving slowly or fidgety/restless 0 0 0 - - - -  Suicidal thoughts 0 (No Data) 1 - - - -  PHQ-9 Score 3 2 9  - - - -  Difficult doing work/chores Not difficult at all Somewhat difficult Not difficult at all - - - -  Some recent data might be hidden     Review of Systems  Constitutional: Negative.   HENT: Negative.   Eyes: Negative.   Respiratory: Positive for apnea.   Cardiovascular: Negative.   Gastrointestinal: Negative.   Endocrine: Negative.   Genitourinary: Negative.   Musculoskeletal: Negative.   Skin: Negative.   Allergic/Immunologic: Negative.   Neurological: Positive for dizziness, weakness and numbness.  Hematological: Negative.   Psychiatric/Behavioral: Positive for confusion.  All other systems reviewed and are negative.      Objective:   Physical Exam   General: Alert and oriented x 3, No apparent distress HEENT: Head is normocephalic, atraumatic, PERRLA, EOMI,  sclera anicteric, oral mucosa pink and moist, dentition intact, ext ear canals clear,  Neck: Supple without JVD or lymphadenopathy Heart: Reg rate and rhythm. No murmurs rubs or gallops Chest: CTA bilaterally without wheezes, rales, or rhonchi; no distress Abdomen: Soft, non-tender, non-distended, bowel sounds positive. Extremities: No clubbing, cyanosis, or edema. Pulses are 2+ Skin: Clean and intact without signs of breakdown Neuro: Pt is cognitively appropriate with normal insight, memory, and awareness. Cranial nerves 2-12 are intact except for left V. Sensory exam is 1/2 LUE and LLE. Improved sensation left facel. Reflexes are 2+ in all 4's. Fine motor coordination is decreased left side. No tremors. Motor function is grossly 5/5. He walks with cane. Still has difficulty with leg and arm swing but improved the longer he went. definitley more fluid than when on rehab. Musculoskeletal: improved left shoulder ROM. Still tight with ab/adduction, extension. +impingement sign Psych: Pt's affect is appropriate. Pt is cooperative        Assessment & Plan:  1.Left side weaknesssecondary to small acute infarct mesial right temporal lobe and occipital lobe onset 06/28/2018 most likely due to atrial fibrillation                --HEP and HH therapies  -cane for ambulation 2. Antithrombotics: -DVT/anticoagulation:ELIQUIS -antiplatelet therapy: Aspirin 81 mg daily               3. Pain Management:Neurontin 100 mg 3 times a day             -ROM, support left shoulder, avoid flinging arm  -MRI at some point to assess RTC  -consider repeat injection 4. History of atrial fibrillation. Continue Eliquis 5 Diabetes mellitus with peripheral neuropathy. Recommended lantus 20u  at night 22u in am, 12u humalog BEFORE meals  6. HTN: borderline. No changes today   Thirty minutes of face to face patient care time were spent during this visit. All questions were encouraged and  answered. Follow up in 2 months.

## 2018-07-16 NOTE — Patient Instructions (Addendum)
AIM FOR 8-10 HOURS OF SLEEP PER NIGHT   INCREASE LANTUS INSULIN TO 22 UNITS IN THE AM AND 20 UNITS IN THE PM  TAKE HUMALOG PRIOR TO MEALS   MAINTAIN RANGE OF MOTION EXERCISES WITH YOUR LEFT SHOULDER BUT DON'T "FLING" YOUR SHOULDERS. WE CAN POTENTIALLY INJECT OR RE-IMAGE THE SHOULDER LATER THIS [place].    USE YOUR CANE FOR BALANCE FOR THE TIME BEING. DON'T FORGET TO SWING YOUR LEFT ARM.

## 2018-07-19 DIAGNOSIS — I5031 Acute diastolic (congestive) heart failure: Secondary | ICD-10-CM | POA: Diagnosis not present

## 2018-07-19 DIAGNOSIS — E1142 Type 2 diabetes mellitus with diabetic polyneuropathy: Secondary | ICD-10-CM | POA: Diagnosis not present

## 2018-07-19 DIAGNOSIS — I4891 Unspecified atrial fibrillation: Secondary | ICD-10-CM | POA: Diagnosis not present

## 2018-07-19 DIAGNOSIS — I2510 Atherosclerotic heart disease of native coronary artery without angina pectoris: Secondary | ICD-10-CM | POA: Diagnosis not present

## 2018-07-19 DIAGNOSIS — I69354 Hemiplegia and hemiparesis following cerebral infarction affecting left non-dominant side: Secondary | ICD-10-CM | POA: Diagnosis not present

## 2018-07-19 DIAGNOSIS — I110 Hypertensive heart disease with heart failure: Secondary | ICD-10-CM | POA: Diagnosis not present

## 2018-07-22 DIAGNOSIS — I5031 Acute diastolic (congestive) heart failure: Secondary | ICD-10-CM | POA: Diagnosis not present

## 2018-07-22 DIAGNOSIS — I69354 Hemiplegia and hemiparesis following cerebral infarction affecting left non-dominant side: Secondary | ICD-10-CM | POA: Diagnosis not present

## 2018-07-22 DIAGNOSIS — I110 Hypertensive heart disease with heart failure: Secondary | ICD-10-CM | POA: Diagnosis not present

## 2018-07-22 DIAGNOSIS — I2510 Atherosclerotic heart disease of native coronary artery without angina pectoris: Secondary | ICD-10-CM | POA: Diagnosis not present

## 2018-07-22 DIAGNOSIS — I4891 Unspecified atrial fibrillation: Secondary | ICD-10-CM | POA: Diagnosis not present

## 2018-07-22 DIAGNOSIS — E1142 Type 2 diabetes mellitus with diabetic polyneuropathy: Secondary | ICD-10-CM | POA: Diagnosis not present

## 2018-07-23 ENCOUNTER — Other Ambulatory Visit: Payer: Self-pay

## 2018-07-23 DIAGNOSIS — I5031 Acute diastolic (congestive) heart failure: Secondary | ICD-10-CM | POA: Diagnosis not present

## 2018-07-23 DIAGNOSIS — I110 Hypertensive heart disease with heart failure: Secondary | ICD-10-CM | POA: Diagnosis not present

## 2018-07-23 DIAGNOSIS — I2510 Atherosclerotic heart disease of native coronary artery without angina pectoris: Secondary | ICD-10-CM | POA: Diagnosis not present

## 2018-07-23 DIAGNOSIS — I4891 Unspecified atrial fibrillation: Secondary | ICD-10-CM | POA: Diagnosis not present

## 2018-07-23 DIAGNOSIS — I69354 Hemiplegia and hemiparesis following cerebral infarction affecting left non-dominant side: Secondary | ICD-10-CM | POA: Diagnosis not present

## 2018-07-23 DIAGNOSIS — E1142 Type 2 diabetes mellitus with diabetic polyneuropathy: Secondary | ICD-10-CM | POA: Diagnosis not present

## 2018-07-23 NOTE — Patient Outreach (Addendum)
[facility]) Care Management  07/23/2018  Mark Cabrera [birth year] [id number]   Received order on 07-09-2018 from Natividad Brood, RN for Community RN CM to follow up with Complex CM DM with  A1C =12 and PCP will do own TOC.   Member with 3 hospital admissions in last 6 months and 2 out of 3 stays >24 hours. Recent hospitalization from 07-01-2018 to 07-09-2018 due to stroke and discharged home with [facility] PT/OT.  Called member at preferred number and member's sister [name] answered phone. Member on phone and HIPPA identity verified. [employer] benefits explained to family to include that services are a benefit of [employer].   Member requested more understanding and RN CM explained that member can be assisted with guidance that will assist with lowering his hemoglobin A1C and Cholesterol levels and will assist with coordination of services.  Member stated that he would prefer to take insulin "novolog flex pen" that was given in the hospital; however, he cannot afford it. Family states the cost of the pen is $700.  Asked member if he would consent to services and member stated "no", I do not want the service. Member's sister [name] stated that she provides care for member and states that member is currently staying at her home [address] [place], [place] [address] and will be there for a while. Education provided that [employer] FSBS should be checked before meals, before lunch, and before supper as [name] stated she was checking FSBS before and after meals and stated that FSBS would increase after meals. Education provided that food will increase levels.   Family declines services; however, RN CM believes that member will benefit from services.  Will send pharmacist referral. Will send brochure, [facility] magnet, and business card to member. Will send case closure letter to PCP.  [name], RN-BSN  [facility] Care Management  Community Care Management  Coordinator  [phone] [email or web address]

## 2018-07-24 DIAGNOSIS — E1142 Type 2 diabetes mellitus with diabetic polyneuropathy: Secondary | ICD-10-CM | POA: Diagnosis not present

## 2018-07-24 DIAGNOSIS — I69354 Hemiplegia and hemiparesis following cerebral infarction affecting left non-dominant side: Secondary | ICD-10-CM | POA: Diagnosis not present

## 2018-07-24 DIAGNOSIS — I2510 Atherosclerotic heart disease of native coronary artery without angina pectoris: Secondary | ICD-10-CM | POA: Diagnosis not present

## 2018-07-24 DIAGNOSIS — I4891 Unspecified atrial fibrillation: Secondary | ICD-10-CM | POA: Diagnosis not present

## 2018-07-24 DIAGNOSIS — I110 Hypertensive heart disease with heart failure: Secondary | ICD-10-CM | POA: Diagnosis not present

## 2018-07-24 DIAGNOSIS — I5031 Acute diastolic (congestive) heart failure: Secondary | ICD-10-CM | POA: Diagnosis not present

## 2018-07-25 ENCOUNTER — Other Ambulatory Visit: Payer: Self-pay | Admitting: Pharmacist

## 2018-07-25 ENCOUNTER — Ambulatory Visit: Payer: Self-pay | Admitting: Pharmacist

## 2018-07-25 DIAGNOSIS — I4891 Unspecified atrial fibrillation: Secondary | ICD-10-CM | POA: Diagnosis not present

## 2018-07-25 DIAGNOSIS — I110 Hypertensive heart disease with heart failure: Secondary | ICD-10-CM | POA: Diagnosis not present

## 2018-07-25 DIAGNOSIS — E1142 Type 2 diabetes mellitus with diabetic polyneuropathy: Secondary | ICD-10-CM | POA: Diagnosis not present

## 2018-07-25 DIAGNOSIS — I5031 Acute diastolic (congestive) heart failure: Secondary | ICD-10-CM | POA: Diagnosis not present

## 2018-07-25 DIAGNOSIS — I69354 Hemiplegia and hemiparesis following cerebral infarction affecting left non-dominant side: Secondary | ICD-10-CM | POA: Diagnosis not present

## 2018-07-25 DIAGNOSIS — I2510 Atherosclerotic heart disease of native coronary artery without angina pectoris: Secondary | ICD-10-CM | POA: Diagnosis not present

## 2018-07-31 ENCOUNTER — Ambulatory Visit: Payer: Self-pay | Admitting: Pharmacist

## 2018-07-31 NOTE — Patient Outreach (Addendum)
[facility]) Care Management  [place]   07/25/2018  JONI NORROD 1950-09-17 [id number]  Reason for referral: Medication Assistance  Referral source: [facility] RN Current insurance: [facility]  PMHx includes but not limited to:  DMT2, HLD, Afib, CVA, CHF  Outreach:  Successful telephone call with Mr. Nebel.  HIPAA identifiers verified. Patient's sister is living with patient and helping him to manage his diabetes (A1c in January was 7.6 now 12 from April 2020).  She states he is now taking insulin, but forgets when she is not reminding him.  The 4-5 times daily injecting schedule is difficult for patient to remember.  I will forward this information to PCP for potential streamlining of regimen.  Sister is unsure of patient's past medication trials of DM medications..  She also states he is eating a lot of fruit and other "sugar" filled foods.  Again, she is trying to help him through this time.  Will continue to outreach patient/sister as information is available.  Consider referral to health coach regarding diabetes.  Objective: Lab Results  Component Value Date   CREATININE 0.96 07/02/2018   CREATININE 0.84 06/30/2018   CREATININE 0.90 06/28/2018    Lab Results  Component Value Date   HGBA1C 12.0 (H) 06/29/2018    Lipid Panel     Component Value Date/Time   CHOL 274 (H) 06/29/2018 0420   CHOL 323 (H) 01/18/2018 0900   TRIG 242 (H) 06/29/2018 0420   HDL 45 06/29/2018 0420   HDL 52 01/18/2018 0900   CHOLHDL 6.1 06/29/2018 0420   VLDL 48 (H) 06/29/2018 0420   LDLCALC 181 (H) 06/29/2018 0420   LDLCALC 223 (H) 01/18/2018 0900    BP Readings from Last 3 Encounters:  07/16/18 (!) 147/88  07/11/18 134/70  07/09/18 (!) 148/78    Allergies  Allergen Reactions  . Penicillins Other (See Comments)    UNSPECIFIED REACTIONS  Has patient had a PCN reaction causing immediate rash, facial/tongue/throat swelling, SOB or lightheadedness with  hypotension: Unk Has patient had a PCN reaction causing severe rash involving mucus membranes or skin necrosis: Unk Has patient had a PCN reaction that required hospitalization: Unk Has patient had a PCN reaction occurring within the last 10 years: No If all of the above answers are "NO", then may proceed with Cephalosporin use.   . Statins Other (See Comments)    Severe rash and back pain  . Morphine And Related Other (See Comments)    hallucinations  . Sglt2 Inhibitors Rash    Candidiasis infection prone    Medications Reviewed Today    Reviewed by Meredith Staggers, MD (Physician) on 07/16/18 at 1148  Med List Status: <None>  Medication Order Taking? Sig Documenting Provider Last Dose Status Informant  acetaminophen (TYLENOL) 325 MG tablet [id number] Yes Take 2 tablets (650 mg total) by mouth every 4 (four) hours as needed for headache or mild pain. Isaiah Serge, NP Taking Active Self  apixaban (ELIQUIS) 5 MG TABS tablet [id number] Yes Take 1 tablet (5 mg total) by mouth 2 (two) times daily. Cathlyn Parsons, PA-C Taking Active   aspirin EC 81 MG tablet [id number] Yes Take 1 tablet (81 mg total) by mouth daily. Burnell Blanks, MD Taking Active Self  atorvastatin (LIPITOR) 80 MG tablet [id number] Yes Take 1 tablet (80 mg total) by mouth daily at 6 PM. Cathlyn Parsons, PA-C Taking Active   BAYER MICROLET LANCETS lancets [id number] Yes Check blood  sugar 3 times a day as instructed Valinda Party, DO Taking Active Self  Evolocumab 140 MG/ML SOAJ [id number] No Inject into the skin. Has not started medication yet [provider] Not Taking Active   gabapentin (NEURONTIN) 100 MG capsule [id number] Yes Take 1 capsule (100 mg total) by mouth 3 (three) times daily. Cathlyn Parsons, PA-C Taking Active   glucose blood (CONTOUR NEXT TEST) test strip [id number] Yes Check blood sugar 3 times a day as instructed Valinda Party, DO Taking Active Self   hydrochlorothiazide (HYDRODIURIL) 12.5 MG tablet [id number] Yes Take 1 tablet (12.5 mg total) by mouth daily. [name], MD Taking Active   Insulin Glargine (LANTUS) 100 UNIT/ML Solostar Pen [id number] Yes Inject 18 Units into the skin 2 (two) times daily. Cathlyn Parsons, PA-C Taking Active   insulin lispro (HUMALOG KWIKPEN) 100 UNIT/ML KwikPen [id number] Yes Inject 0.12 mLs (12 Units total) into the skin 3 (three) times daily before meals. Aldine Contes, MD Taking Active   nitroGLYCERIN (NITROSTAT) 0.4 MG SL tablet [id number] Yes Place 1 tablet (0.4 mg total) under the tongue every 5 (five) minutes as needed for chest pain. Jule Ser, DO Taking Active Self          Assessment:  Drugs sorted by system:  Neurologic/Psychologic: gabapentin  Cardiovascular: apixaban, ASA, atorvastatin, hctz, nitrates PRN  Endocrine: lantus, humalog  Plan: . Message routed to PCP regarding diabetes regimen/refills . Case Closure . Encouraged family to call if needs arise.  Regina Eck, PharmD, [place]  [phone]

## 2018-08-13 ENCOUNTER — Ambulatory Visit: Payer: Self-pay | Admitting: Pharmacist

## 2018-08-15 ENCOUNTER — Ambulatory Visit: Payer: Self-pay | Admitting: Pharmacist

## 2018-08-19 ENCOUNTER — Telehealth: Payer: Self-pay

## 2018-08-19 NOTE — Telephone Encounter (Signed)
Left vm for patients sister Levander Campion that appt for her brother will be cancel on June 12 due to office being closed and also COVID 19. I stated appt will have to be r/s for a hospital video visit. I left number for pts sister to call back to r/s.

## 2018-08-19 NOTE — Telephone Encounter (Signed)
If sister Levander Campion calls back pt is high risk to be seen in the office at this time due to [place] 19. Please arrange video visit and get verbal consent to do video and file insurance. Need to know where to email and text link once they r/s. Need to know cell phone carrier.

## 2018-08-23 ENCOUNTER — Inpatient Hospital Stay: Payer: Medicare Other | Admitting: Adult Health

## 2018-08-26 ENCOUNTER — Telehealth: Payer: Self-pay | Admitting: Dietician

## 2018-08-26 NOTE — Telephone Encounter (Signed)
Called and left a message that  it was urgent he speak with [place].Tried calling this patient. I returned call and Left voicemail for return call

## 2018-08-26 NOTE — Telephone Encounter (Signed)
Mr. Fare called back again. He reports he "has not been able to get his blood sugar below 200 mg/dl since he has been out of the hospital for last 1 month to 1 1/2 months."  Wants help getting blood sugars lower. They had it down to normal quickly while in the hospital. They used his arms,can he use arms?  It was not humalog or lantus they used. It was novolog and that worked a lot better to [employer] his blood sugars  Stomach is getting hard from taking shots in stomach. It feels strange; hard on both sides, ,  Sugar was 276 this morning and he didn't eat anything all last night and it doesn't make sense..." please call.  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 Hard as a rock on the left side.   Vision pretty bad. Was good before he went into the hospital after cataracts surgery. He cannot see to paint. Has gotten worse since getting out of the hospital.   Blood sugars- 200s mostly for past 3 weeks.  Blood sugar 174-350 at sisters house despite her feeding him healthy foods. He thinks Novolog helped blood sugar better. Humalog is not working despite his eating as healthy as he can, low fat and low sugar mostly. Although he says he ate cereal and milk a while ago that he knows contains carb.    He is keeping insulin in the refrigerator, a few that he is using in an insulated bag. Not cloudy. Stays between 40-80 degrees.   Plan: He agrees to rotate injection sites not using hard areas until further notice. We discussed how to inject in arms, upper buttocks and thigh propersly by using a pinch up/roll.   I told him I would forwrad this to his doctor for further input. Prefers trial of different insulin. He thinks Novolog.

## 2018-08-27 NOTE — Telephone Encounter (Signed)
Would he be a good candidate for omnipod or other insulin pumps?

## 2018-08-29 ENCOUNTER — Other Ambulatory Visit: Payer: Self-pay

## 2018-08-29 ENCOUNTER — Encounter (HOSPITAL_COMMUNITY): Payer: Self-pay | Admitting: Emergency Medicine

## 2018-08-29 ENCOUNTER — Telehealth: Payer: Self-pay | Admitting: *Deleted

## 2018-08-29 ENCOUNTER — Ambulatory Visit (HOSPITAL_COMMUNITY)
Admission: EM | Admit: 2018-08-29 | Discharge: 2018-08-29 | Disposition: A | Discharge status: Home / Self Care | Payer: Medicare Other | Attending: Family Medicine | Admitting: Family Medicine

## 2018-08-29 DIAGNOSIS — H60332 Swimmer's ear, left ear: Secondary | ICD-10-CM | POA: Diagnosis not present

## 2018-08-29 DIAGNOSIS — Z20822 Contact with and (suspected) exposure to covid-19: Secondary | ICD-10-CM

## 2018-08-29 MED ORDER — NEOMYCIN-POLYMYXIN-HC 3.5-10000-1 OT SUSP: 3.0000 [drp] | Freq: Four times a day (QID) | OTIC | 0 refills | Status: DC

## 2018-08-29 NOTE — Telephone Encounter (Signed)
Patient called to schedule an appt for covid-19 testing.Voicemail left to return our call @ [phone] M-F 7a-7p to schedule testing. Order placed

## 2018-08-29 NOTE — ED Triage Notes (Signed)
Left ear pain for 3 days. Uses various items to clean ears.

## 2018-08-29 NOTE — Telephone Encounter (Signed)
-----   Message from Janith Lima, PA-C sent at 08/29/2018  3:37 PM EDT ----- Regarding: Needs COVID testing High risk, new living situation requiring negative testing prior to entering

## 2018-08-29 NOTE — Discharge Instructions (Signed)
Use cortisporin ear drops 4 times a day Continue Tylenol  Follow up if symptoms worsening or changing

## 2018-08-30 ENCOUNTER — Other Ambulatory Visit: Payer: Medicare Other

## 2018-08-30 DIAGNOSIS — R6889 Other general symptoms and signs: Secondary | ICD-10-CM | POA: Diagnosis not present

## 2018-08-30 DIAGNOSIS — Z20822 Contact with and (suspected) exposure to covid-19: Secondary | ICD-10-CM

## 2018-08-30 NOTE — Telephone Encounter (Signed)
Pt called back to schedule testing; pt offered and accepted appointment at [facility] 08/30/2018 at 1300; pt given address, location, and instructions that he and all occupants of his vehicle should wear masks; he verbalized understanding; orders placed per protocol.

## 2018-08-30 NOTE — ED Provider Notes (Signed)
[place]    CSN: [id number] Arrival date & time: 08/29/18  1412      History   Chief Complaint Chief Complaint  Patient presents with  . Otalgia    HPI Mark Cabrera is a 68 y.o. male history of CHF, recent stroke, sleep apnea, hypertension, hyperlipidemia, DM type II, presenting today for evaluation of left ear pain.  Patient has noticed pain in his left ear over the past 3 days.  He states his symptoms started after he attempted putting "silver" and "colonial" drops in his ear as he thought he had water in his ear.  He denies any drainage.  Denies significant change in hearing.  He denies associated URI symptoms of congestion cough or sore throat.  Denies any fevers chills or body aches.  Denies recurrent issues with his ears.  Denies any noticeable swelling.  Patient also notes that he is entering a new living situation which is requiring him to show proof of negative COVID testing prior to entering.  He notes that he has high risk given his medical conditions and is concerned about this as he has had difficulty finding testing that is a Editor, commissioning site.  HPI  Past Medical History:  Diagnosis Date  . Acute diastolic congestive heart failure ([place])   . Aortic valve stenosis s/p AVR 2018  . Atrial fibrillation ([place]) - post-op CABG    04/2016 CHA2DS2VAS score = 5  . Atypical nevi   . Coronary artery disease s/p 2 vessel CABG   . Depression    "years ago"  . Diabetes mellitus   . Dyspnea    in the past   . GERD (gastroesophageal reflux disease)   . Heart murmur   . Hepatic cirrhosis ([place]) 06/29/2017  . History of kidney stones   . Hyperlipidemia    hx of transaminitis secondary to statin and he has decided not to use statins secondary to potential side effects.  . Hypertension   . Nephrolithiasis   . Osteoarthritis, knee   . Sleep apnea     Central apnea. Not using cpap  . Stroke [facility]) 2007  . Transaminitis     Statin-induced  . 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 2-D echo was normal no embolic source found.    Patient Active Problem List   Diagnosis Date Noted  . Rotator cuff syndrome, left   . 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 CVA (cerebral vascular accident) ([place]) 06/28/2018  . Hepatic cirrhosis ([place]) 06/29/2017  . Candida albicans infection 01/09/2017  . Healthcare maintenance 12/16/2016  . Lipoma of left upper extremity 09/12/2016  . Chronic knee pain 07/04/2016  . Decreased visual acuity 07/04/2016  . Peripheral neuropathic pain 05/12/2016  . Pericardial effusion a. subxiphoid pericardial window on 04/21/2016 04/28/2016  . Pleural effusion on left, s/p throacentesis 04/18/16 04/28/2016  . Paroxysmal atrial fibrillation ([place]) 04/16/2016  . 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  . CAD (coronary artery disease), native coronary artery   . Acute diastolic CHF (congestive heart failure) ([place])   . Sleep apnea 08/02/2009  . 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 Dyslipidemia 05/18/2006  . Hypertensive heart  disease   . History of dissection of vertebral artery  ([place]) 08/02/2005    Past Surgical History:  Procedure Laterality Date  . AORTIC VALVE REPLACEMENT N/A 03/30/2016   Procedure: AORTIC VALVE REPLACEMENT (AVR);  Surgeon: [name], MD;  Location: [place];  Service: Open Heart Surgery;  Laterality: N/A;  . CARDIAC CATHETERIZATION N/A 03/15/2016   Procedure: Right/Left Heart Cath and Coronary Angiography;  Surgeon: Burnell Blanks, MD;  Location: [place] [facility];  Service: Cardiovascular;  Laterality: N/A;  . CLIPPING OF ATRIAL  APPENDAGE N/A 03/30/2016   Procedure: CLIPPING OF LEFT ATRIAL APPENDAGE;  Surgeon: [name], MD;  Location: [place];  Service: Open Heart Surgery;  Laterality: N/A;  . CORONARY ARTERY BYPASS GRAFT N/A 03/30/2016   Procedure: CORONARY ARTERY BYPASS GRAFTING (CABG) Times Two;  Surgeon: [name], MD;  Location: [place];  Service: Open Heart Surgery;  Laterality: N/A;  . KNEE ARTHROSCOPY W/ PARTIAL MEDIAL MENISCECTOMY  05/12/2005   right, performed by Dr. French Ana for torn medial meniscus.  Marland Kitchen ROTATOR CUFF REPAIR Right 2016  . STERNAL WIRES REMOVAL N/A 08/13/2017   Procedure: STERNAL WIRES REMOVAL;  Surgeon: [name], MD;  Location: [place];  Service: Thoracic;  Laterality: N/A;  . SUBXYPHOID PERICARDIAL WINDOW N/A 04/21/2016   Procedure: SUBXYPHOID PERICARDIAL WINDOW;  Surgeon: [name], MD;  Location: [place];  Service: Thoracic;  Laterality: N/A;  . TEE WITHOUT CARDIOVERSION N/A 03/30/2016   Procedure: TRANSESOPHAGEAL ECHOCARDIOGRAM (TEE);  Surgeon: [name], MD;  Location: [place];  Service: Open Heart Surgery;  Laterality: N/A;  . TEE WITHOUT CARDIOVERSION N/A 04/21/2016   Procedure: TRANSESOPHAGEAL ECHOCARDIOGRAM (TEE);  Surgeon: [name], MD;  Location: [facility] OR;  Service: Thoracic;  Laterality: N/A;       Home Medications    Prior to Admission medications   Medication Sig Start Date End Date Taking? Authorizing Provider  acetaminophen (TYLENOL) 325 MG tablet Take 2 tablets (650 mg total) by mouth every 4 (four) hours as needed for headache or mild pain. 04/28/16   Isaiah Serge, NP  apixaban (ELIQUIS) 5 MG TABS tablet Take 1 tablet (5 mg total) by mouth 2 (two) times daily. 07/09/18   Angiulli, Lavon Paganini, PA-C  aspirin EC 81 MG tablet Take 1 tablet (81 mg total) by mouth daily. 05/31/16   Burnell Blanks, MD  atorvastatin (LIPITOR) 80 MG tablet Take 1 tablet (80 mg total) by mouth daily at 6 PM. 07/09/18   Angiulli, Lavon Paganini, PA-C   BAYER MICROLET LANCETS lancets Check blood sugar 3 times a day as instructed 06/28/17   Kalman Shan Ratliff, DO  Evolocumab 140 MG/ML SOAJ Inject into the skin. Has not started medication yet    [provider]  gabapentin (NEURONTIN) 100 MG capsule Take 1 capsule (100 mg total) by mouth 3 (three) times daily. 07/09/18   Angiulli, Lavon Paganini, PA-C  glucose blood (CONTOUR NEXT TEST) test strip Check blood sugar 3 times a day as instructed 06/17/18   Kalman Shan Ratliff, DO  hydrochlorothiazide (HYDRODIURIL) 12.5 MG tablet Take 1 tablet (12.5 mg total) by mouth daily. 07/12/18   [name], MD  Insulin Glargine (LANTUS) 100 UNIT/ML Solostar Pen Inject 18 Units into the skin 2 (two) times daily. 07/09/18   Angiulli, Lavon Paganini, PA-C  insulin lispro (HUMALOG KWIKPEN) 100 UNIT/ML KwikPen Inject 0.12 mLs (12 Units total) into the skin 3 (three) times daily before meals. 07/10/18   Dareen Piano,  [place], MD  neomycin-polymyxin-hydrocortisone (CORTISPORIN) 3.5-10000-1 OTIC suspension Place 3 drops into the left ear 4 (four) times daily. 08/29/18   Lakeithia Rasor C, PA-C  nitroGLYCERIN (NITROSTAT) 0.4 MG SL tablet Place 1 tablet (0.4 mg total) under the tongue every 5 (five) minutes as needed for chest pain. 02/14/17   Jule Ser, DO    Family History Family History  Problem Relation Age of Onset  . Hypertension Mother   . Diabetes Mother   . Alcohol abuse Father     Social History Social History   Tobacco Use  . Smoking status: Never Smoker  . Smokeless tobacco: Never Used  Substance Use Topics  . Alcohol use: Yes    Alcohol/week: 1.0 standard drinks    Types: 1 Cans of beer per week    Comment: occasional  . Drug use: No     Allergies   Penicillins, Statins, Morphine and related, and Sglt2 inhibitors   Review of Systems Review of Systems  Constitutional: Negative for activity change, appetite change, chills, fatigue and fever.  HENT: Positive for ear pain. Negative for  congestion, rhinorrhea, sinus pressure, sore throat and trouble swallowing.   Eyes: Negative for discharge and redness.  Respiratory: Negative for cough, chest tightness and shortness of breath.   Cardiovascular: Negative for chest pain.  Gastrointestinal: Negative for abdominal pain, diarrhea, nausea and vomiting.  Musculoskeletal: Negative for myalgias.  Skin: Negative for rash.  Neurological: Negative for dizziness, light-headedness and headaches.     Physical Exam Triage Vital Signs ED Triage Vitals  Enc Vitals Group     BP 08/29/18 1510 138/70     Pulse Rate 08/29/18 1510 72     Resp 08/29/18 1510 16     Temp 08/29/18 1510 98 F (36.7 C)     Temp Source 08/29/18 1510 Oral     SpO2 08/29/18 1510 98 %     Weight --      Height --      Head Circumference --      Peak Flow --      Pain Score 08/29/18 1505 3     Pain Loc --      Pain Edu? --      Excl. in [place]? --    No data found.  Updated Vital Signs BP 138/70   Pulse 72   Temp 98 F (36.7 C) (Oral)   Resp 16   SpO2 98%   Visual Acuity Right Eye Distance:   Left Eye Distance:   Bilateral Distance:    Right Eye Near:   Left Eye Near:    Bilateral Near:     Physical Exam Vitals signs and nursing note reviewed.  Constitutional:      Appearance: He is well-developed.  HENT:     Head: Normocephalic and atraumatic.     Ears:     Comments: Mildly erythematous left EAC, no significant swelling or drainage noted, TM intact and does not appear dull or erythematous  Right TM and canal without abnormalities noted    Mouth/Throat:     Comments: Oral mucosa pink and moist, no tonsillar enlargement or exudate. Posterior pharynx patent and nonerythematous, no uvula deviation or swelling. Normal phonation. No soft palate swelling noted Eyes:     Conjunctiva/sclera: Conjunctivae normal.  Neck:     Musculoskeletal: Neck supple.     Comments: No overlying neck swelling erythema, no lymphadenopathy Cardiovascular:      Rate and Rhythm: Normal rate and regular rhythm.  Heart sounds: No murmur.  Pulmonary:     Effort: Pulmonary effort is normal. No respiratory distress.     Breath sounds: Normal breath sounds.     Comments: Breathing comfortably at rest, CTABL, no wheezing, rales or other adventitious sounds auscultated Abdominal:     Palpations: Abdomen is soft.     Tenderness: There is no abdominal tenderness.  Skin:    General: Skin is warm and dry.  Neurological:     Mental Status: He is alert.      UC Treatments / Results  Labs (all labs ordered are listed, but only abnormal results are displayed) Labs Reviewed - No data to display  EKG None  Radiology No results found.  Procedures Procedures (including critical care time)  Medications Ordered in UC Medications - No data to display  Initial Impression / Assessment and Plan / UC Course  I have reviewed the triage vital signs and the nursing notes.  Pertinent labs & imaging results that were available during my care of the patient were reviewed by me and considered in my medical decision making (see chart for details).     Patient appears to have mild otitis externa; will treat with Cortisporin drops, continue Tylenol for pain.  Keep ear clean and dry.  Follow-up if symptoms worsening or developing any facial/neck swelling, fevers.  Did send patient's information over to [facility] to set up care for testing.  Discussed strict return precautions. Patient verbalized understanding and is agreeable with plan.  Final Clinical Impressions(s) / UC Diagnoses   Final diagnoses:  Acute swimmer's ear of left side     Discharge Instructions     Use cortisporin ear drops 4 times a day Continue Tylenol  Follow up if symptoms worsening or changing   ED Prescriptions    Medication Sig Dispense Auth. Provider   neomycin-polymyxin-hydrocortisone (CORTISPORIN) 3.5-10000-1 OTIC suspension Place 3 drops into the left ear 4 (four) times  daily. 10 mL Dameshia Seybold C, PA-C     Controlled Substance Prescriptions [place] Controlled Substance Registry consulted? Not Applicable   Janith Lima, [place] 08/30/18 1025

## 2018-08-30 NOTE — Telephone Encounter (Signed)
Placed call to patient at request of Diabetic Educator who is concerned for patient related to his elevated BG. No answer. Left message on VM requesting return call. If patient returns call today, he can be placed on [facility] schedule for telehealth visit. PCP also has availability on 09/05/2018. Hubbard Hartshorn, RN, BSN

## 2018-08-30 NOTE — Telephone Encounter (Addendum)
Yes he is a candidate for an omnipod or Vgo if he agrees to it. Called and left a message for him to call the office or the after hours number if needed.

## 2018-09-05 ENCOUNTER — Telehealth: Payer: Self-pay

## 2018-09-05 LAB — NOVEL CORONAVIRUS, NAA: SARS-CoV-2, NAA: NOT DETECTED

## 2018-09-05 NOTE — Telephone Encounter (Signed)
Requesting lab results. Please call back.  

## 2018-09-05 NOTE — Telephone Encounter (Signed)
Per donnap. Dr Daryll Drown - please seek care ASAP.may icnrease humalog to 14units 3x daily. Pt is offered [facility] this pm, he states when his ride gets there , he is going to go to [place] care. He is agreeable

## 2018-09-05 NOTE — Telephone Encounter (Signed)
COVID not back, informed him he will be notified He states he needs to see his regular doctor and donna the sugar doctor Informed him he will receive a new pcp soon, need to wait til COVID testing comes back to sch appt. Sending to donnap. To call him about his cbg results

## 2018-09-05 NOTE — Telephone Encounter (Signed)
Blood sugars better 200s. 191 this am, did not eat anything. Ear not hurting, but feels weird.  Lantus- 22 at night 20 units Humalog- 12 units 3x/day Hard spots the same, left side hard as a rock, tight, feels like fluid. Not using it anymore. using thighs and upper hips. Vision is getting worse, blurry, depth perception worse. Not walking well. Willing to see acc doctor.

## 2018-09-05 NOTE — Telephone Encounter (Signed)
Spoke in person with [employer] .  Based on symptoms he should present to the ED for new neurological symptoms and concern for high blood sugar.  Can have an appointment Monday if COVID test is negative.

## 2018-09-10 ENCOUNTER — Encounter: Payer: Medicare Other | Attending: Physical Medicine & Rehabilitation | Admitting: Physical Medicine & Rehabilitation

## 2018-09-10 ENCOUNTER — Encounter: Payer: Self-pay | Admitting: *Deleted

## 2018-09-10 DIAGNOSIS — I10 Essential (primary) hypertension: Secondary | ICD-10-CM | POA: Insufficient documentation

## 2018-09-10 DIAGNOSIS — E1142 Type 2 diabetes mellitus with diabetic polyneuropathy: Secondary | ICD-10-CM | POA: Insufficient documentation

## 2018-09-10 DIAGNOSIS — Z8249 Family history of ischemic heart disease and other diseases of the circulatory system: Secondary | ICD-10-CM | POA: Insufficient documentation

## 2018-09-10 DIAGNOSIS — Z951 Presence of aortocoronary bypass graft: Secondary | ICD-10-CM | POA: Insufficient documentation

## 2018-09-10 DIAGNOSIS — Z833 Family history of diabetes mellitus: Secondary | ICD-10-CM | POA: Insufficient documentation

## 2018-09-10 DIAGNOSIS — Z7982 Long term (current) use of aspirin: Secondary | ICD-10-CM | POA: Insufficient documentation

## 2018-09-10 DIAGNOSIS — Z952 Presence of prosthetic heart valve: Secondary | ICD-10-CM | POA: Insufficient documentation

## 2018-09-10 DIAGNOSIS — Z8673 Personal history of transient ischemic attack (TIA), and cerebral infarction without residual deficits: Secondary | ICD-10-CM | POA: Insufficient documentation

## 2018-09-10 DIAGNOSIS — R531 Weakness: Secondary | ICD-10-CM | POA: Insufficient documentation

## 2018-09-10 DIAGNOSIS — I4891 Unspecified atrial fibrillation: Secondary | ICD-10-CM | POA: Insufficient documentation

## 2018-09-10 DIAGNOSIS — Z7901 Long term (current) use of anticoagulants: Secondary | ICD-10-CM | POA: Insufficient documentation

## 2018-09-10 DIAGNOSIS — G4731 Primary central sleep apnea: Secondary | ICD-10-CM | POA: Insufficient documentation

## 2018-09-10 DIAGNOSIS — I25810 Atherosclerosis of coronary artery bypass graft(s) without angina pectoris: Secondary | ICD-10-CM | POA: Insufficient documentation

## 2018-09-11 NOTE — Telephone Encounter (Signed)
Lm for rtc 

## 2018-09-18 ENCOUNTER — Telehealth: Payer: Self-pay | Admitting: Dietician

## 2018-09-18 NOTE — Telephone Encounter (Signed)
Sugars high, stumbling around; 332 is his fasting reading today. took shots and pills yesterday. Would like an appointment for help with his blood sugar. Spoke to triage nurse.

## 2018-09-18 NOTE — Telephone Encounter (Signed)
Scheduled appt for Friday and instructed to brings meds and meter.

## 2018-09-20 ENCOUNTER — Other Ambulatory Visit: Payer: Self-pay

## 2018-09-20 ENCOUNTER — Ambulatory Visit (INDEPENDENT_AMBULATORY_CARE_PROVIDER_SITE_OTHER): Payer: Medicare Other | Admitting: Internal Medicine

## 2018-09-20 VITALS — BP 156/76 | HR 74 | Temp 97.9°F | Wt 228.7 lb

## 2018-09-20 DIAGNOSIS — H0102A Squamous blepharitis right eye, upper and lower eyelids: Secondary | ICD-10-CM | POA: Diagnosis not present

## 2018-09-20 DIAGNOSIS — I119 Hypertensive heart disease without heart failure: Secondary | ICD-10-CM | POA: Diagnosis not present

## 2018-09-20 DIAGNOSIS — Z79899 Other long term (current) drug therapy: Secondary | ICD-10-CM

## 2018-09-20 DIAGNOSIS — Z794 Long term (current) use of insulin: Secondary | ICD-10-CM | POA: Diagnosis not present

## 2018-09-20 DIAGNOSIS — H538 Other visual disturbances: Secondary | ICD-10-CM | POA: Diagnosis not present

## 2018-09-20 DIAGNOSIS — H547 Unspecified visual loss: Secondary | ICD-10-CM | POA: Diagnosis not present

## 2018-09-20 DIAGNOSIS — I480 Paroxysmal atrial fibrillation: Secondary | ICD-10-CM | POA: Diagnosis not present

## 2018-09-20 DIAGNOSIS — Z7901 Long term (current) use of anticoagulants: Secondary | ICD-10-CM | POA: Diagnosis not present

## 2018-09-20 DIAGNOSIS — Z7982 Long term (current) use of aspirin: Secondary | ICD-10-CM

## 2018-09-20 DIAGNOSIS — E1149 Type 2 diabetes mellitus with other diabetic neurological complication: Secondary | ICD-10-CM | POA: Diagnosis not present

## 2018-09-20 DIAGNOSIS — H53453 Other localized visual field defect, bilateral: Secondary | ICD-10-CM | POA: Diagnosis not present

## 2018-09-20 DIAGNOSIS — H3562 Retinal hemorrhage, left eye: Secondary | ICD-10-CM | POA: Diagnosis not present

## 2018-09-20 DIAGNOSIS — I69354 Hemiplegia and hemiparesis following cerebral infarction affecting left non-dominant side: Secondary | ICD-10-CM | POA: Diagnosis not present

## 2018-09-20 DIAGNOSIS — E1039 Type 1 diabetes mellitus with other diabetic ophthalmic complication: Secondary | ICD-10-CM | POA: Diagnosis not present

## 2018-09-20 DIAGNOSIS — H0102B Squamous blepharitis left eye, upper and lower eyelids: Secondary | ICD-10-CM | POA: Diagnosis not present

## 2018-09-20 DIAGNOSIS — I2510 Atherosclerotic heart disease of native coronary artery without angina pectoris: Secondary | ICD-10-CM

## 2018-09-20 DIAGNOSIS — H532 Diplopia: Secondary | ICD-10-CM

## 2018-09-20 DIAGNOSIS — H26493 Other secondary cataract, bilateral: Secondary | ICD-10-CM | POA: Diagnosis not present

## 2018-09-20 DIAGNOSIS — H35372 Puckering of macula, left eye: Secondary | ICD-10-CM | POA: Diagnosis not present

## 2018-09-20 DIAGNOSIS — H3509 Other intraretinal microvascular abnormalities: Secondary | ICD-10-CM | POA: Diagnosis not present

## 2018-09-20 DIAGNOSIS — E1140 Type 2 diabetes mellitus with diabetic neuropathy, unspecified: Secondary | ICD-10-CM

## 2018-09-20 LAB — POCT GLYCOSYLATED HEMOGLOBIN (HGB A1C): Hemoglobin A1C: 10.8 % — AB (ref 4.0–5.6)

## 2018-09-20 LAB — HM DIABETES EYE EXAM

## 2018-09-20 LAB — GLUCOSE, CAPILLARY: Glucose-Capillary: 371 mg/dL — ABNORMAL HIGH (ref 70–99)

## 2018-09-20 MED ORDER — APIXABAN 5 MG PO TABS: 5.0000 mg | ORAL_TABLET | Freq: Two times a day (BID) | ORAL | 0 refills | Status: DC

## 2018-09-20 MED ORDER — INSULIN GLARGINE 100 UNIT/ML SOLOSTAR PEN: 25.0000 [IU] | PEN_INJECTOR | Freq: Two times a day (BID) | SUBCUTANEOUS | 11 refills | Status: DC

## 2018-09-20 MED ORDER — HYDROCHLOROTHIAZIDE 12.5 MG PO TABS: 25.0000 mg | ORAL_TABLET | Freq: Every day | ORAL | 1 refills | Status: DC

## 2018-09-20 MED ORDER — INSULIN LISPRO (1 UNIT DIAL) 100 UNIT/ML (KWIKPEN): 15.0000 [IU] | PEN_INJECTOR | Freq: Three times a day (TID) | SUBCUTANEOUS | 11 refills | Status: DC

## 2018-09-20 NOTE — Progress Notes (Signed)
Internal Medicine Clinic Attending  Case discussed with Dr. Seawell at the time of the visit.  We reviewed the resident's history and exam and pertinent patient test results.  I agree with the assessment, diagnosis, and plan of care documented in the resident's note.    

## 2018-09-20 NOTE — Patient Instructions (Signed)
Thank you for allowing [place] to provide your care today. Today we discussed your high blood pressure medication.   I have ordered the following labs for you:  Basic metabolic panel    I will call if any are abnormal.    Today we made the following changes to your medications:   Please START taking  Please increase your Lantus to 25U two times per day   Please increase your Humalog to 15U three times a day WITH meals. Please continue to check your glucose prior to meals.   Please start taking hydrochlorothiazide 25 mg total - take two tablets per day.    I hope that your move goes well. Please call us in the mean time if you need to come back to clinic before you move.    Should you have any questions or concerns please call the internal medicine clinic at [phone].

## 2018-09-20 NOTE — Assessment & Plan Note (Signed)
He thinks that he is not taking eliquis, but is really not sure as his sister in law takes care of all of his medications. Tried to call his sister but was unable to get in touch. His CN exam is normal today and consistent with previous stroke.   - refill eliquis 5mg  - discussed that he will need to continue on this medication  - asked patient to have his sister in law call me for med review.

## 2018-09-20 NOTE — Progress Notes (Signed)
   CC: blurry vision   HPI:  [name] is a 68 y.o. with PMH as below.   Please see A&P for assessment of the patient's acute and chronic medical conditions.   Past Medical History:  Diagnosis Date  . Acute diastolic congestive heart failure ([place])   . Aortic valve stenosis s/p AVR 2018  . Atrial fibrillation ([place]) - post-op CABG    04/2016 CHA2DS2VAS score = 5  . Atypical nevi   . Coronary artery disease s/p 2 vessel CABG   . Depression    "years ago"  . Diabetes mellitus   . Dyspnea    in the past   . GERD (gastroesophageal reflux disease)   . Heart murmur   . Hepatic cirrhosis ([place]) 06/29/2017  . History of kidney stones   . Hyperlipidemia    hx of transaminitis secondary to statin and he has decided not to use statins secondary to potential side effects.  . Hypertension   . Nephrolithiasis   . Osteoarthritis, knee   . Sleep apnea     Central apnea. Not using cpap  . Stroke [facility]) 2007  . Transaminitis     Statin-induced  . 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 2-D echo was normal no embolic source found.   Review of Systems:   Review of Systems  Eyes: Positive for blurred vision and double vision. Negative for photophobia, pain, discharge and redness.  Cardiovascular: Negative for chest pain, palpitations and leg swelling.  Neurological: Negative for dizziness, tingling, tremors, sensory change, speech change, focal weakness, weakness and headaches.   Physical Exam:  Constitution: NAD, healthy appearing male  Eyes: eom intact, left vision 20/50; right 20/40; both 20/50; perrla  Cardio: rrr, normal rate and rhythm  Respiratory: cta, no wheezing rales or rhonchi  MSK: strength 5/5  UE b/l, LE left 4/5 and chronic, right 5/5  Neuro: CN II-XII intact with residual CN XI weakness on left from previous stroke  Skin: c/d/i    Vitals:   09/20/18 0927  BP: (!) 156/76  Pulse: 74  Temp: 97.9 F (36.6 C)  TempSrc: Oral  SpO2: 100%  Weight: 228 lb 11.2 oz (103.7 kg)     Assessment & Plan:   See Encounters Tab for problem based charting.  Patient discussed with Dr. Evette Doffing

## 2018-09-20 NOTE — Assessment & Plan Note (Signed)
He is moving and would like to get his vision checked again prior to moving as soon as possible. He has a history of stroke, type II diabetes, and recent surgery for cataracts. He has noticed over the past few months that his vision is getting progressively worse. This initially improved after his cataract surgery with Dr. Katy Fitch but he feels that he is back to his baseline from prior to this. He has nearsightedness and farsightedness, but it is his myopia that is worsening. He did have a stroke in April and did notice some change in vision at that time, mainly that his superior peripheral vision seemed decreased. He feels that he now has change in vision has been gradual over the past couple months and not sudden. He does have a history of occipital stroke.  His cranial nerve exam is unchanged from prior. EOM intact and PERRLA and his peripheral vision is intact. Vision 20/50 in both, 20/40 on right and 20/50 on left.   - contact optho clinic and they will see him today - referral placed  - increase diabetes medications - his glucose is currently uncontrolled and his change in vision may be related to this

## 2018-09-20 NOTE — Assessment & Plan Note (Signed)
Glucose at home has been 200 and above ever since he got out of the hospital, often as high as 300. He checks it both before and after he eats. Taking 14 humalog tid and lantus 20 in the morning and 22 at night, which was increased in May by PM&R. He does not have AC or a refrigerator and has not been able to store his insulin in ice as he usually tries to do. He is moving and when he moves will have a fridge available.  Glucose today is 371. A1c is improved to 10.8 from 12 three months ago.   - increase lantus to 25U bid- he was on this previously and states it did lower his glucose under 200  - increase humalog to 15U tid with meals  - will represcribe his humalog and lantus as he does not have much left and it has been stored in the heat - he is interested in starting V-Go. As he is moving in the next few weeks and has some difficulties with short term memory I will hold off on starting this and have him bring it up with his new PCP once he moves.

## 2018-09-20 NOTE — Assessment & Plan Note (Addendum)
BP Readings from Last 3 Encounters:  09/20/18 (!) 156/76  08/29/18 138/70  07/16/18 (!) 147/88  He believes he has been taking his HCTZ although his sister in law takes care of his medications. Blood pressure elevated today    - increase HCTZ to 25 mg qd - will discuss medications with sister - f/u in two weeks if he hasn't moved

## 2018-09-25 ENCOUNTER — Encounter: Payer: Self-pay | Admitting: *Deleted

## 2018-09-26 ENCOUNTER — Ambulatory Visit (INDEPENDENT_AMBULATORY_CARE_PROVIDER_SITE_OTHER): Payer: Medicare Other | Admitting: Adult Health

## 2018-09-26 ENCOUNTER — Encounter: Payer: Self-pay | Admitting: Adult Health

## 2018-09-26 ENCOUNTER — Other Ambulatory Visit: Payer: Self-pay

## 2018-09-26 VITALS — BP 140/83 | HR 74 | Temp 96.8°F | Ht 67.0 in | Wt 207.8 lb

## 2018-09-26 DIAGNOSIS — E785 Hyperlipidemia, unspecified: Secondary | ICD-10-CM | POA: Diagnosis not present

## 2018-09-26 DIAGNOSIS — H538 Other visual disturbances: Secondary | ICD-10-CM

## 2018-09-26 DIAGNOSIS — E1140 Type 2 diabetes mellitus with diabetic neuropathy, unspecified: Secondary | ICD-10-CM

## 2018-09-26 DIAGNOSIS — I480 Paroxysmal atrial fibrillation: Secondary | ICD-10-CM

## 2018-09-26 DIAGNOSIS — I639 Cerebral infarction, unspecified: Secondary | ICD-10-CM | POA: Diagnosis not present

## 2018-09-26 DIAGNOSIS — Z794 Long term (current) use of insulin: Secondary | ICD-10-CM

## 2018-09-26 NOTE — Progress Notes (Signed)
[facility] [address] [place]. [place] [address] [phone]       HOSPITAL FOLLOW UP NOTE  [name] Date of Birth:  October 06, 1950 Medical Record Number:  [id number]   Reason for Referral:  hospital stroke follow up    CHIEF COMPLAINT:  Chief Complaint  Patient presents with   Follow-up    Rm 9, daughter, Mark Cabrera   Hospitalization Follow-up   Cerebrovascular Accident    HPI: Mark Cabrera is being seen today for initial in office hospital follow-up regarding small acute infarcts on 06/28/2018. History obtained from patient, daughter and chart review. Reviewed all radiology images and labs personally.  [name] is a 68 y.o. male with history of prior left PICA infarct in 2007, postop atrial fibrillation not on anticoagulation, and vertebral artery dissection, HLD, HTN, DM, CAD and OSA not on CPAP  who presented to [facility] ED on 06/28/2018 with left-sided weakness and visual change that was present on awakening that morning.Marland Kitchen He did not receive IV t-PA due to late presentation.  Stroke work-up revealed small acute infarcts in the mesial right temporal lobe and right occipital lobe infarcts embolic pattern with history of atrial fibrillation and dilated left atrium with resultant left homonymous hemianopsia and mild diminished sensation.  CT head unremarkable.  MRI showed small acute infarct in the medial right temporal lobe and right occipital lobe.  CTA head and neck showed occlusion right proximal P2 segment and dominant right vertebral artery with mild to moderate stenosis.  2D echo showed EF 50 to 55% with severely dilated left atrium.  Previously on aspirin and recommended initiating long-term anticoagulation with Eliquis due to prior history of postop A. fib and recent stroke.  LDL 181 and A1c 12.  Recommended initiating atorvastatin 80 mg daily with consideration of initiating PCSK9 inhibitor due to history of statin intolerance along with follow-up with  PCP for uncontrolled DM.  HTN stable.  Other stroke risk factors include advanced age, EtOH use, prior history of stroke, CAD, OSA not on CPAP and atrial fibrillation status post CABG.  He was discharged to CIR to participate in comprehensive rehabilitation program.  Residual deficits of balance difficulties (worsened after recent stroke), blurred vision and cognitive/memory deficit.  He was previously participating in home health therapies but this has since been completed.  He is able to ambulate without assistive device but states at times his depth perception will be off.  Overall, residual deficits have been improving per patient He follows up with ophthalmologist Dr. Katy Cabrera -recent blood vessel burst secondary to diabetes- will be having laser surgery on Tuesday -Per patient, Dr. Katy Cabrera believes this may help with his blurred vision  He currently lives with his sister who helps assist with medication administration and ongoing compliance.  He does endorse increased stress with his current living situation and plans on moving to [place] will he will be renting a room with a friend in the next 2 to 3 weeks. Continues on Eliquis and aspirin without bleeding or bruising Continues on atorvastatin 80 mg daily - medication administration assisted by brother and sister in law Blood pressure satisfactory 140/83 Recent A1c 10.8 on 09/20/18   ROS:   14 system review of systems performed and negative with exception of blurred vision, memory loss, and balance difficulties  PMH:  Past Medical History:  Diagnosis Date   Acute diastolic congestive heart failure (HCC)    Aortic valve stenosis s/p AVR 2018   Atrial fibrillation ([place]) -  post-op CABG    04/2016 CHA2DS2VAS score = 5   Atypical nevi    Coronary artery disease s/p 2 vessel CABG    Depression    "years ago"   Diabetes mellitus    Dyspnea    in the past    GERD (gastroesophageal reflux disease)    Heart murmur    Hepatic cirrhosis  ([place]) 06/29/2017   History of Cabrera stones    Hyperlipidemia    hx of transaminitis secondary to statin and he has decided not to use statins secondary to potential side effects.   Hypertension    Nephrolithiasis    Osteoarthritis, knee    Sleep apnea     Central apnea. 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 2-D echo was normal no embolic source found.    PSH:  Past Surgical History:  Procedure Laterality Date   AORTIC VALVE REPLACEMENT N/A 03/30/2016   Procedure: AORTIC VALVE REPLACEMENT (AVR);  Surgeon: [name], MD;  Location: [facility];  Service: Open Heart Surgery;  Laterality: N/A;   CARDIAC CATHETERIZATION N/A 03/15/2016   Procedure: Right/Left Heart Cath and Coronary Angiography;  Surgeon: Burnell Blanks, MD;  Location: [facility];  Service: Cardiovascular;  Laterality: N/A;   CLIPPING OF ATRIAL APPENDAGE N/A 03/30/2016   Procedure: CLIPPING OF LEFT ATRIAL APPENDAGE;  Surgeon: [name], MD;  Location: [facility];  Service: Open Heart Surgery;  Laterality: N/A;   CORONARY ARTERY BYPASS GRAFT N/A 03/30/2016   Procedure: CORONARY ARTERY BYPASS GRAFTING (CABG) Times Two;  Surgeon: [name], MD;  Location: [facility];  Service: Open Heart Surgery;  Laterality: N/A;   KNEE ARTHROSCOPY W/ PARTIAL MEDIAL MENISCECTOMY  05/12/2005   right, performed by Dr. French Ana for torn medial meniscus.   ROTATOR CUFF REPAIR Right 2016   STERNAL WIRES REMOVAL N/A 08/13/2017   Procedure: STERNAL WIRES REMOVAL;  Surgeon: [name], MD;  Location: [facility];  Service: Thoracic;  Laterality: N/A;    SUBXYPHOID PERICARDIAL WINDOW N/A 04/21/2016   Procedure: SUBXYPHOID PERICARDIAL WINDOW;  Surgeon: [name], MD;  Location: [facility];  Service: Thoracic;  Laterality: N/A;   TEE WITHOUT CARDIOVERSION N/A 03/30/2016   Procedure: TRANSESOPHAGEAL ECHOCARDIOGRAM (TEE);  Surgeon: [name], MD;  Location: [facility];  Service: Open Heart Surgery;  Laterality: N/A;   TEE WITHOUT CARDIOVERSION N/A 04/21/2016   Procedure: TRANSESOPHAGEAL ECHOCARDIOGRAM (TEE);  Surgeon: [name], MD;  Location: [facility] OR;  Service: Thoracic;  Laterality: N/A;    Social History:  Social History   Socioeconomic History   Marital status: Divorced    Spouse name: Not on file   Number of children: 1   Years of education: 2y college   Highest education level: Not on file  Occupational History   Occupation:  Medicaid /disability    Employer: UNEMPLOYED    Comment: following stroke in 2007  Social Needs   Financial resource strain: Not hard at all   Food insecurity    Worry: Never true    Inability: Never true   Transportation needs    Medical: No  Non-medical: No  Tobacco Use   Smoking status: Never Smoker   Smokeless tobacco: Never Used  Substance and Sexual Activity   Alcohol use: Yes    Alcohol/week: 1.0 standard drinks    Types: 1 Cans of beer per week    Comment: occasional   Drug use: No   Sexual activity: Not Currently  Lifestyle   Physical activity    Days per week: 0 days    Minutes per session: 0 min   Stress: Only a little  Relationships   Social connections    Talks on phone: More than three times a week    Gets together: Once a week    Attends religious service: Never    Active member of club or organization: No    Attends meetings of clubs or organizations: Never    Relationship status: Not on file   Intimate partner violence    Fear of current or ex partner: Not on file    Emotionally abused: Not on file    Physically abused: Not on file     Forced sexual activity: Not on file  Other Topics Concern   Not on file  Social History Narrative   Single but has a girlfriend.    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       Patient has medications disability post stroke.             Family History:  Family History  Problem Relation Age of Onset   Hypertension Mother    Diabetes Mother    Alcohol abuse Father     Medications:   Current Outpatient Medications on File Prior to Visit  Medication Sig Dispense Refill   acetaminophen (TYLENOL) 325 MG tablet Take 2 tablets (650 mg total) by mouth every 4 (four) hours as needed for headache or mild pain.     apixaban (ELIQUIS) 5 MG TABS tablet Take 1 tablet (5 mg total) by mouth 2 (two) times daily. 60 tablet 0   aspirin EC 81 MG tablet Take 1 tablet (81 mg total) by mouth daily. 90 tablet 3   atorvastatin (LIPITOR) 80 MG tablet Take 1 tablet (80 mg total) by mouth daily at 6 PM. 30 tablet 0   BAYER MICROLET LANCETS lancets Check blood sugar 3 times a day as instructed 100 each 12   Evolocumab 140 MG/ML SOAJ Inject into the skin. Has not started medication yet     gabapentin (NEURONTIN) 100 MG capsule Take 1 capsule (100 mg total) by mouth 3 (three) times daily. 270 capsule 2   glucose blood (CONTOUR NEXT TEST) test strip Check blood sugar 3 times a day as instructed 100 each 12   hydrochlorothiazide (HYDRODIURIL) 12.5 MG tablet Take 2 tablets (25 mg total) by mouth daily. 90 tablet 1   Insulin Glargine (LANTUS) 100 UNIT/ML Solostar Pen Inject 25 Units into the skin 2 (two) times daily. 15 mL 11   insulin lispro (HUMALOG KWIKPEN) 100 UNIT/ML KwikPen Inject 0.15 mLs (15 Units total) into the skin 3 (three) times daily. 15 mL 11   neomycin-polymyxin-hydrocortisone (CORTISPORIN) 3.5-10000-1 OTIC suspension Place 3 drops into the left ear 4 (four) times daily. 10 mL 0   nitroGLYCERIN (NITROSTAT) 0.4 MG SL tablet Place 1 tablet (0.4 mg  total) under the tongue every 5 (five) minutes as needed for chest pain. 25 tablet 1   No current facility-administered medications on file prior to visit.  Allergies:   Allergies  Allergen Reactions   Penicillins Other (See Comments)    UNSPECIFIED REACTIONS  Has patient had a PCN reaction causing immediate rash, facial/tongue/throat swelling, SOB or lightheadedness with hypotension: Unk Has patient had a PCN reaction causing severe rash involving mucus membranes or skin necrosis: Unk Has patient had a PCN reaction that required hospitalization: Unk Has patient had a PCN reaction occurring within the last 10 years: No If all of the above answers are "NO", then may proceed with Cephalosporin use.    Statins Other (See Comments)    Severe rash and back pain   Morphine And Related Other (See Comments)    hallucinations   Sglt2 Inhibitors Rash    Candidiasis infection prone     Physical Exam  Vitals:   09/26/18 1134  BP: 140/83  Pulse: 74  Temp: (!) 96.8 F (36 C)  Weight: 207 lb 12.8 oz (94.3 kg)  Height: 5\' 7"  (1.702 m)   Body mass index is 32.55 kg/m. No exam data present  Depression screen [facility] 2/9 09/26/2018  Decreased Interest 0  Down, Depressed, Hopeless 0  PHQ - 2 Score 0  Altered sleeping -  Tired, decreased energy -  Change in appetite -  Feeling bad or failure about yourself  -  Trouble concentrating -  Moving slowly or fidgety/restless -  Suicidal thoughts -  PHQ-9 Score -  Difficult doing work/chores -  Some recent data might be hidden     General: well developed, well nourished,  pleasant elderly Caucasian male, seated, in no evident distress Head: head normocephalic and atraumatic.   Neck: supple with no carotid or supraclavicular bruits Cardiovascular: regular rate and rhythm, no murmurs Musculoskeletal: no deformity Skin:  no rash/petichiae Vascular:  Normal pulses all extremities   Neurologic Exam Mental Status: Awake and fully  alert. Oriented to place and time. Recent and remote memory intact. Attention span, concentration and fund of knowledge appropriate. difficulty with serial additions. Recall 3/3. Named 8 4 legged animals in 60 seconds. Mood and affect appropriate.  Cranial Nerves: Fundoscopic exam reveals sharp disc margins. Pupils equal, briskly reactive to light. Extraocular movements full without nystagmus. Visual fields full to confrontation. Hearing intact. Facial sensation intact. Face, tongue, palate moves normally and symmetrically.  Motor: Normal bulk and tone. Normal strength in all tested extremity muscles. Sensory.: intact to touch , pinprick , position and vibratory sensation.  Coordination: Rapid alternating movements normal in all extremities. Finger-to-nose and heel-to-shin performed accurately bilaterally. Gait and Station: Arises from chair without difficulty. Stance is normal. Gait demonstrates normal stride length and balance Reflexes: 1+ and symmetric. Toes downgoing.     NIHSS  0 Modified Rankin  2  Diagnostic Data (Labs, Imaging, Testing)  Ct Angio Head W Or Wo Contrast Ct Angio Neck W Or Wo Contrast Ct Cerebral Perfusion W Contrast 06/28/2018 IMPRESSION:  1. Occlusion proximal right P2 segment. Corresponding areas of delayed perfusion in the right posterior cerebral artery involving the right hippocampus, thalamus, and right occipital lobe. MRI recommended to evaluate for acute infarct in these areas.  2. Mild atherosclerotic disease in the carotid artery bilaterally  3. Dominant right vertebral artery with mild-to-moderate stenosis proximally and mild stenosis distally. Non dominant left vertebral artery with occlusion left V4 segment.    Mr Brain Wo Contrast 06/28/2018 IMPRESSION:  1. Small acute infarcts in the mesial right temporal lobe and right occipital lobe.  2. Chronic left basal ganglia lacunar infarct, new from 2014.  Ct Head Code Stroke Wo  Contrast 06/28/2018 IMPRESSION:  1. No acute abnormality  2. ASPECTS is 10    Transthoracic Echocardiogram  Ejection fraction 50 to 55%.  Left atrium severely dilated.  Degenerative changes and thickening of mitral and aortic valves.    EKG - SR rate 60 BPM. (See cardiology reading for complete details)  Lipid Panel     Component Value Date/Time   CHOL 274 (H) 06/29/2018 0420   CHOL 323 (H) 01/18/2018 0900   TRIG 242 (H) 06/29/2018 0420   HDL 45 06/29/2018 0420   HDL 52 01/18/2018 0900   CHOLHDL 6.1 06/29/2018 0420   VLDL 48 (H) 06/29/2018 0420   LDLCALC 181 (H) 06/29/2018 0420   LDLCALC 223 (H) 01/18/2018 0900         ASSESSMENT: Mark Cabrera is a 68 y.o. year old male who presented to [facility] ED on 06/28/2018 with left-sided weakness and visual changes with stroke work-up revealing small acute infarcts in the medial right temporal lobe and right occipital lobe likely cardioembolic with prior history of atrial fibrillation postop and dilated left atrium. Vascular risk factors include transient A. fib post CABG, HTN, HLD, DM, prior history of stroke, EtOH use, CAD and OSA with noncompliance of CPAP.  Overall stable from a stroke standpoint with residual blurred vision, gait difficulty and subjective cognitive/memory loss.    PLAN:  1. Embolic stroke: Continue aspirin 81 mg daily and Eliquis (apixaban) daily  and atorvastatin for secondary stroke prevention. Maintain strict control of hypertension with blood pressure goal below 130/90, diabetes with hemoglobin A1c goal below 6.5% and cholesterol with LDL cholesterol (bad cholesterol) goal below 70 mg/dL.  I also advised the patient to eat a healthy diet with plenty of whole grains, cereals, fruits and vegetables, exercise regularly with at least 30 minutes of continuous activity daily and maintain ideal body weight. 2. Atrial fibrillation: Continuation of Eliquis 3. Memory loss: Appears to be more related to anxiety/stress  which he blames on current living situation and feels as though this will improve once he moves to [place].  Discussion regarding possible participation in speech therapy for cognitive concerns but declines at this time as he will be moving out of state 4. Blurred vision: Continue to follow with Dr. Katy Cabrera and undergo laser procedure next week for hopeful improvement of blurred vision 5. HTN: Advised to continue current treatment regimen. Advised to continue to monitor at home along with continued follow-up with PCP for management 6. HLD: Advised to continue current treatment regimen along with continued follow-up with PCP for future prescribing and monitoring of lipid panel 7. DMII: Advised to continue to monitor glucose levels at home along with continued follow-up with PCP for management and monitoring 8. OSA: Continues to decline CPAP for OSA management.  Discussion regarding increased risk of recurrent stroke and cardiac conditions with untreated OSA   Advised to follow-up as needed as he will be moving out of state in the next 2 to 3 weeks.  Highly encouraged establishing care with PCP soon and ensure he receives referral to establish care with neurology  Greater than 50% of time during this 45 minute visit was spent on counseling, explanation of diagnosis of embolic stroke, reviewing risk factor management of HTN, HLD and DM, planning of further management along with potential future management, and discussion with patient and family answering all questions.    Venancio Poisson, AGNP-BC  [facility] [address] [place], [place] [address]  Phone [phone] Fax [phone] Note: This document was prepared with digital dictation and possible smart phrase technology. Any transcriptional errors that result from this process are unintentional.

## 2018-09-26 NOTE — Patient Instructions (Signed)
Continue aspirin 81 mg daily and Eliquis (apixaban) daily  and lipitor  for secondary stroke prevention  Continue to follow up with PCP regarding cholesterol, diabetes and blood pressure management   Start speech therapy once you move to [place] along with ensursing you establish care with neurology   Continue to monitor blood pressure at home  Maintain strict control of hypertension with blood pressure goal below 130/90, diabetes with hemoglobin A1c goal below 6.5% and cholesterol with LDL cholesterol (bad cholesterol) goal below 70 mg/dL. I also advised the patient to eat a healthy diet with plenty of whole grains, cereals, fruits and vegetables, exercise regularly and maintain ideal body weight.         Thank you for coming to see [place] at [facility]. I hope we have been able to provide you high quality care today.  You may receive a patient satisfaction survey over the next few weeks. We would appreciate your feedback and comments so that we may continue to improve ourselves and the health of our patients.

## 2018-09-27 NOTE — Progress Notes (Signed)
I agree with the above plan 

## 2018-09-30 ENCOUNTER — Other Ambulatory Visit: Payer: Self-pay | Admitting: Internal Medicine

## 2018-09-30 DIAGNOSIS — E1140 Type 2 diabetes mellitus with diabetic neuropathy, unspecified: Secondary | ICD-10-CM

## 2018-10-01 ENCOUNTER — Encounter: Payer: Self-pay | Admitting: *Deleted

## 2018-10-01 ENCOUNTER — Other Ambulatory Visit: Payer: Self-pay

## 2018-10-01 ENCOUNTER — Encounter (INDEPENDENT_AMBULATORY_CARE_PROVIDER_SITE_OTHER): Payer: Medicare Other | Admitting: Ophthalmology

## 2018-10-01 DIAGNOSIS — H35033 Hypertensive retinopathy, bilateral: Secondary | ICD-10-CM | POA: Diagnosis not present

## 2018-10-01 DIAGNOSIS — E11311 Type 2 diabetes mellitus with unspecified diabetic retinopathy with macular edema: Secondary | ICD-10-CM | POA: Diagnosis not present

## 2018-10-01 DIAGNOSIS — I10 Essential (primary) hypertension: Secondary | ICD-10-CM | POA: Diagnosis not present

## 2018-10-01 DIAGNOSIS — E113312 Type 2 diabetes mellitus with moderate nonproliferative diabetic retinopathy with macular edema, left eye: Secondary | ICD-10-CM

## 2018-10-01 DIAGNOSIS — H43813 Vitreous degeneration, bilateral: Secondary | ICD-10-CM | POA: Diagnosis not present

## 2018-10-01 LAB — HM DIABETES EYE EXAM

## 2018-10-14 ENCOUNTER — Telehealth: Payer: Self-pay | Admitting: Dietician

## 2018-10-14 NOTE — Telephone Encounter (Signed)
Spoke with triage nurse and front office who scheduled him fo ran appointment tomorrow at 315 PM.

## 2018-10-14 NOTE — Telephone Encounter (Addendum)
Mark Cabrera calls wanting help with blood sugars. He says "Lantus and Humalog are not doing anything". He has a hard time getting transporation here for appointments because he needs to ask family for rides.  He also says he needs medical clearance for eye surgery and that he cannot see well. He had an appointment with Mark Cabrera. Then saw Mark Cabrera who wants to do surgery- diabetes is not affecting his eyes,but vision is getting worse. He reports the following blood sugars: 529 last week, 498 a little while ago today. Mostly over 300 pretty consistently for the past month - has not cheated on diet or taking insulin.   Medicine- Lantus 25 twice a day- 50 a day,  15 Humalog at least 3 times day every time I eat  Thinks he has labs that need to be drawn.  May have a number after August 6th - he thinks that phone number is [phone]. Staying with sister, Mark Cabrera, right now whose number [phone]  Mark Cabrera put his sister on the phone who reports that he is drinking large amounts of OJ and eating cookies and does not take insulin or check blood sugars regularly. She says his memory is horrible after his stroke. She says he is not telling the whole truth and wonders if he needs assisted living.

## 2018-10-15 ENCOUNTER — Other Ambulatory Visit: Payer: Self-pay | Admitting: Internal Medicine

## 2018-10-15 ENCOUNTER — Encounter: Payer: Self-pay | Admitting: Internal Medicine

## 2018-10-15 ENCOUNTER — Other Ambulatory Visit: Payer: Self-pay

## 2018-10-15 ENCOUNTER — Ambulatory Visit (INDEPENDENT_AMBULATORY_CARE_PROVIDER_SITE_OTHER): Payer: Medicare Other | Admitting: Internal Medicine

## 2018-10-15 DIAGNOSIS — Z794 Long term (current) use of insulin: Secondary | ICD-10-CM | POA: Diagnosis not present

## 2018-10-15 DIAGNOSIS — I639 Cerebral infarction, unspecified: Secondary | ICD-10-CM

## 2018-10-15 DIAGNOSIS — H538 Other visual disturbances: Secondary | ICD-10-CM | POA: Diagnosis not present

## 2018-10-15 DIAGNOSIS — E1140 Type 2 diabetes mellitus with diabetic neuropathy, unspecified: Secondary | ICD-10-CM

## 2018-10-15 DIAGNOSIS — I2510 Atherosclerotic heart disease of native coronary artery without angina pectoris: Secondary | ICD-10-CM

## 2018-10-15 NOTE — Telephone Encounter (Signed)
thanks

## 2018-10-15 NOTE — Progress Notes (Signed)
   CC: DM, surgical clearance  HPI:  [name] is a 68 y.o. gentleman with PMHx listed below who presents for concern of elevated blood sugars in the setting of poorly controlled diabetes and surgical clearance for retina procedure. Please see problem based charting for further details.   Past Medical History:  Diagnosis Date  . Acute diastolic congestive heart failure ([place])   . Aortic valve stenosis s/p AVR 2018  . Atrial fibrillation ([place]) - post-op CABG    04/2016 CHA2DS2VAS score = 5  . Atypical nevi   . Coronary artery disease s/p 2 vessel CABG   . Depression    "years ago"  . Diabetes mellitus   . Dyspnea    in the past   . GERD (gastroesophageal reflux disease)   . Heart murmur   . Hepatic cirrhosis ([place]) 06/29/2017  . History of kidney stones   . Hyperlipidemia    hx of transaminitis secondary to statin and he has decided not to use statins secondary to potential side effects.  . Hypertension   . Nephrolithiasis   . Osteoarthritis, knee   . Sleep apnea     Central apnea. Not using cpap  . Stroke [facility]) 2007  . Transaminitis     Statin-induced  . Vertebral artery dissection ([place]) 2007    medullary stroke/PICA,  no significant carotid disease on Dopplers. 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 2-D echo was normal no embolic source found.   Review of Systems:  Review of Systems  Constitutional: Positive for malaise/fatigue. Negative for chills and fever.  Eyes: Positive for blurred vision.  Respiratory: Negative for shortness of breath.   Cardiovascular: Negative for chest pain, palpitations and leg swelling.  Gastrointestinal: Negative for abdominal pain.  Musculoskeletal: Negative for falls.  Skin: Negative for rash.  Neurological: Positive for dizziness. Negative for loss of  consciousness.    Physical Exam:  Vitals:   10/15/18 1534  BP: 133/76  Pulse: 70  Temp: 97.8 F (36.6 C)  TempSrc: Oral  SpO2: 100%  Weight: 206 lb 11.2 oz (93.8 kg)  Height: 5\' 7"  (1.702 m)   General: alert, well-appearing gentleman in NAD HEENT: conjunctiva normal CV: RRR; no m/r/g Pulm: normal work of breathing; lungs CTAB MSK: no edema Skin: no rashes or skin breakdown Psych: appropriate mood and affect   Assessment & Plan:   See Encounters Tab for problem based charting.  Patient discussed with Dr. Lynnae January

## 2018-10-15 NOTE — Patient Instructions (Addendum)
Mark Cabrera, It was nice meeting you. Today we discussed your diabetes and pre-op clearance for your retina procedure. We have contacted Dr. Zigmund Daniel' office, and they will be faxing over the appropriate forms to fill out. I'd like you to come back later this week to fill out the forms.  Please bring your sister to this appointment so we can further discuss getting your blood sugars under better control.   We will check some lab work today and will let you know these results at your follow-up appointment.  We will not make any medication changes today.   Take care, Dr. Koleen Distance

## 2018-10-16 ENCOUNTER — Encounter: Payer: Self-pay | Admitting: Internal Medicine

## 2018-10-16 LAB — BMP8+ANION GAP
Anion Gap: 20 mmol/L — ABNORMAL HIGH (ref 10.0–18.0)
BUN/Creatinine Ratio: 20 (ref 10–24)
BUN: 20 mg/dL (ref 8–27)
CO2: 19 mmol/L — ABNORMAL LOW (ref 20–29)
Calcium: 9.8 mg/dL (ref 8.6–10.2)
Chloride: 101 mmol/L (ref 96–106)
Creatinine, Ser: 1 mg/dL (ref 0.76–1.27)
GFR calc Af Amer: 88 mL/min/{1.73_m2} (ref >59)
GFR calc non Af Amer: 76 mL/min/{1.73_m2} (ref >59)
Glucose: 225 mg/dL — ABNORMAL HIGH (ref 65–99)
Potassium: 4.6 mmol/L (ref 3.5–5.2)
Sodium: 140 mmol/L (ref 134–144)

## 2018-10-16 NOTE — Assessment & Plan Note (Signed)
He has been referred to Dr. Zigmund Daniel for retinal procedure, but requires medical clearance prior to surgery. Have requested his office to fax paperwork to complete appropriate forms which we will complete at follow-up visit with his sister within the next week.

## 2018-10-16 NOTE — Assessment & Plan Note (Signed)
Patient's blood sugars remains poorly controlled. They have not been below 200 for quite some time and has seen numbers in 3 and 400s. He states he is taking the recently increased Humalog 15 units TID with meals and Lantus 25 units BID. However, he also admits that his memory is quite poor since his most recent stroke and he relies heavily on his sister to help take care of him and manage his medications. Reviewing recent telephone encounter with [name], sister reports he is not taking insulin as prescribed, drinks a lot of orange juice and eats lots of cookies.  Unfortunately, his sister did not accompany him to appointment today. Will have him follow-up in a few days when sister is able to join him, as I am hesitant to make any changes without getting a better idea of what he is actually taking and hopefully coming up with better plan for treatment adherence.

## 2018-10-18 NOTE — Progress Notes (Signed)
Internal Medicine Clinic Attending  Case discussed with Dr. Bloomfield at the time of the visit.  We reviewed the resident's history and exam and pertinent patient test results.  I agree with the assessment, diagnosis, and plan of care documented in the resident's note.  

## 2018-10-22 ENCOUNTER — Ambulatory Visit (INDEPENDENT_AMBULATORY_CARE_PROVIDER_SITE_OTHER): Payer: Medicare Other | Admitting: Internal Medicine

## 2018-10-22 ENCOUNTER — Other Ambulatory Visit: Payer: Self-pay

## 2018-10-22 VITALS — BP 140/80 | HR 64 | Temp 97.5°F | Ht 67.0 in | Wt 211.5 lb

## 2018-10-22 DIAGNOSIS — E1140 Type 2 diabetes mellitus with diabetic neuropathy, unspecified: Secondary | ICD-10-CM

## 2018-10-22 DIAGNOSIS — Z794 Long term (current) use of insulin: Secondary | ICD-10-CM

## 2018-10-22 DIAGNOSIS — E1149 Type 2 diabetes mellitus with other diabetic neurological complication: Secondary | ICD-10-CM | POA: Diagnosis not present

## 2018-10-22 DIAGNOSIS — Z01818 Encounter for other preprocedural examination: Secondary | ICD-10-CM | POA: Diagnosis present

## 2018-10-22 DIAGNOSIS — I110 Hypertensive heart disease with heart failure: Secondary | ICD-10-CM

## 2018-10-22 DIAGNOSIS — Z8673 Personal history of transient ischemic attack (TIA), and cerebral infarction without residual deficits: Secondary | ICD-10-CM | POA: Diagnosis not present

## 2018-10-22 DIAGNOSIS — I2510 Atherosclerotic heart disease of native coronary artery without angina pectoris: Secondary | ICD-10-CM | POA: Diagnosis not present

## 2018-10-22 DIAGNOSIS — Z7982 Long term (current) use of aspirin: Secondary | ICD-10-CM

## 2018-10-22 DIAGNOSIS — I509 Heart failure, unspecified: Secondary | ICD-10-CM | POA: Diagnosis not present

## 2018-10-22 DIAGNOSIS — H538 Other visual disturbances: Secondary | ICD-10-CM

## 2018-10-22 DIAGNOSIS — Z9119 Patient's noncompliance with other medical treatment and regimen: Secondary | ICD-10-CM

## 2018-10-22 NOTE — Progress Notes (Signed)
   CC: Preop evaluation and diabetes  HPI:  Mark Cabrera is a 68 y.o. male with a past medical history stated bellow and presented today for preop evaluation prior to eye surgery. Please see problem based assessment and plan for additional details.   Past Medical History:  Diagnosis Date  . Acute diastolic congestive heart failure ([place])   . Aortic valve stenosis s/p AVR 2018  . Atrial fibrillation ([place]) - post-op CABG    04/2016 CHA2DS2VAS score = 5  . Atypical nevi   . Coronary artery disease s/p 2 vessel CABG   . Depression    "years ago"  . Diabetes mellitus   . Dyspnea    in the past   . GERD (gastroesophageal reflux disease)   . Heart murmur   . Hepatic cirrhosis ([place]) 06/29/2017  . History of kidney stones   . Hyperlipidemia    hx of transaminitis secondary to statin and he has decided not to use statins secondary to potential side effects.  . Hypertension   . Nephrolithiasis   . Osteoarthritis, knee   . Sleep apnea     Central apnea. Not using cpap  . Stroke [facility]) 2007  . Transaminitis     Statin-induced  . 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 2-D echo was normal no embolic source found.     Review of Systems: Review of Systems  Constitutional: Positive for malaise/fatigue. Negative for chills, fever and weight loss.  Eyes: Positive for blurred vision.  Respiratory: Negative for cough and shortness of breath.   Cardiovascular: Negative for chest pain and leg swelling.  Neurological: Positive for dizziness.  Endo/Heme/Allergies: Negative for polydipsia.     Vitals:   10/22/18 0941  BP: 140/80  Pulse: 64  Temp: (!) 97.5 F (36.4 C)  TempSrc: Oral  SpO2: 99%  Weight: 211  lb 8 oz (95.9 kg)  Height: 5\' 7"  (1.702 m)     Physical Exam: Physical Exam  Constitutional: He is oriented to person, place, and time and well-developed, well-nourished, and in no distress.  Cardiovascular: Normal rate, regular rhythm and intact distal pulses. Exam reveals no gallop and no friction rub.  No murmur heard. Pulmonary/Chest: Effort normal and breath sounds normal. He exhibits no tenderness.  Neurological: He is alert and oriented to person, place, and time.  Skin: Skin is warm and dry.     Assessment & Plan:   See Encounters Tab for problem based charting.  Patient seen with Dr. Lynnae January

## 2018-10-22 NOTE — Patient Instructions (Signed)
Thank you, MarkMark Cabrera for allowing [place] to provide your care today. Today we discussed preprocedure examination prior to eye surgery.    I have ordered no labs for you. I will call if any are abnormal.    I have place a referrals to none   I have ordered the following tests: none   We changes the following medications: I will need to you to check you blood sugar at least 3 to 4 time a day for 1 week in order to make changes to you diabetes medications. Please contact the assisted living of your choosing and get them to send [place] the [facility] paper work. I will work on your paper work for Mark Cabrera and fax it over as soon as I am finished.   Please follow-up in 1 week.    Should you have any questions or concerns please call the internal medicine clinic at [phone].    [name], D.O. [facility]

## 2018-10-23 ENCOUNTER — Encounter: Payer: Self-pay | Admitting: *Deleted

## 2018-10-23 NOTE — Assessment & Plan Note (Addendum)
Patient presents for preprocedural evaluation prior to eye surgery with Dr. Zigmund Daniel.  Patient is currently using insulin therapy and has a history of poorly controlled diabetes.  Patient finds it difficult to routinely check blood sugar and wants to know if he could get an insulin pump.  I counseled the patient regarding the need for better control of his blood glucose before we can attempt to get insurance to cover an insulin pump.  Plan: - Patient agreed to measure his blood sugar 3-4 times daily for the next 2 weeks.  He has made a follow-up appointment so we can adjust his insulin therapy at that time.

## 2018-10-25 NOTE — Assessment & Plan Note (Signed)
Patient presents today for preprocedural evaluation prior to retinal surgery.  Based on the patient's significant past medical history of poorly controlled diabetes, hypertension, coronary artery disease, congestive heart failure, and cerebrovascular accident, he is at high risk for a cardiac event during surgery.  Although the patient states that he can walk and go up stairs, his functional capacity is significantly decreased.  His revised cardiac risk index is greater than 11% which puts him at high risk.  I discussed these results with the patient and counseled him regarding the risks of having this procedure.

## 2018-10-25 NOTE — Progress Notes (Signed)
Internal Medicine Clinic Attending  I saw and evaluated the patient.  I personally confirmed the key portions of the history and exam documented by Dr. [name] and I reviewed pertinent patient test results.  The assessment, diagnosis, and plan were formulated together and I agree with the documentation in the resident's note.    Mark Cabrera admits that he is careless about managing his diabetes.  He is now living with his sister but she is not an ideal assistant to help him manage his diabetes.  He is interested in independent living facilities or assisted living facilities and our staff will be providing him information regarding this.  I do think he needs more assistance than he currently is getting in his present living situation.  I worry about the finances and whether he is going to be able to do an independent living facility or assisted living facility.  The form that we were provided by his ophthalmologist indicated he was getting general anesthesia and as Dr. [name] indicated in his note, the patient is high risk for cardiovascular perioperative complications.

## 2018-11-04 ENCOUNTER — Telehealth: Payer: Self-pay | Admitting: *Deleted

## 2018-11-04 ENCOUNTER — Other Ambulatory Visit: Payer: Self-pay

## 2018-11-04 ENCOUNTER — Encounter: Payer: Self-pay | Admitting: Internal Medicine

## 2018-11-04 ENCOUNTER — Ambulatory Visit (INDEPENDENT_AMBULATORY_CARE_PROVIDER_SITE_OTHER): Payer: Medicare Other | Admitting: Internal Medicine

## 2018-11-04 VITALS — BP 165/91 | HR 71 | Temp 98.1°F | Ht 67.0 in | Wt 210.0 lb

## 2018-11-04 DIAGNOSIS — I6300 Cerebral infarction due to thrombosis of unspecified precerebral artery: Secondary | ICD-10-CM

## 2018-11-04 DIAGNOSIS — E1140 Type 2 diabetes mellitus with diabetic neuropathy, unspecified: Secondary | ICD-10-CM

## 2018-11-04 DIAGNOSIS — F321 Major depressive disorder, single episode, moderate: Secondary | ICD-10-CM

## 2018-11-04 DIAGNOSIS — Z7982 Long term (current) use of aspirin: Secondary | ICD-10-CM

## 2018-11-04 DIAGNOSIS — Z794 Long term (current) use of insulin: Secondary | ICD-10-CM

## 2018-11-04 LAB — GLUCOSE, CAPILLARY: Glucose-Capillary: 151 mg/dL — ABNORMAL HIGH (ref 70–99)

## 2018-11-04 NOTE — Patient Instructions (Signed)
Thank you, Mr.Peter Royston Bake for allowing [place] to provide your care today. Today we discussed depression and chronic medical conditions.    I have ordered none labs for you. I will call if any are abnormal.    I have place a referrals to none   I have ordered the following tests: none   We changes the following medications: none   Please follow-up in Tomorrow at 9 am.    Should you have any questions or concerns please call the internal medicine clinic at [phone].    [name], D.O. [facility]
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
## 2018-11-05 ENCOUNTER — Ambulatory Visit (INDEPENDENT_AMBULATORY_CARE_PROVIDER_SITE_OTHER): Payer: Medicare Other | Admitting: Internal Medicine

## 2018-11-05 ENCOUNTER — Telehealth: Payer: Self-pay

## 2018-11-05 ENCOUNTER — Ambulatory Visit: Payer: Medicare Other

## 2018-11-05 VITALS — BP 124/79 | HR 82 | Temp 98.1°F | Wt 210.4 lb

## 2018-11-05 DIAGNOSIS — E1149 Type 2 diabetes mellitus with other diabetic neurological complication: Secondary | ICD-10-CM

## 2018-11-05 DIAGNOSIS — F0631 Mood disorder due to known physiological condition with depressive features: Secondary | ICD-10-CM

## 2018-11-05 DIAGNOSIS — Z7982 Long term (current) use of aspirin: Secondary | ICD-10-CM | POA: Diagnosis not present

## 2018-11-05 DIAGNOSIS — F321 Major depressive disorder, single episode, moderate: Secondary | ICD-10-CM

## 2018-11-05 DIAGNOSIS — E118 Type 2 diabetes mellitus with unspecified complications: Secondary | ICD-10-CM | POA: Diagnosis not present

## 2018-11-05 DIAGNOSIS — Z79899 Other long term (current) drug therapy: Secondary | ICD-10-CM

## 2018-11-05 DIAGNOSIS — I69311 Memory deficit following cerebral infarction: Secondary | ICD-10-CM

## 2018-11-05 DIAGNOSIS — F32A Depression, unspecified: Secondary | ICD-10-CM | POA: Insufficient documentation

## 2018-11-05 DIAGNOSIS — E1140 Type 2 diabetes mellitus with diabetic neuropathy, unspecified: Secondary | ICD-10-CM

## 2018-11-05 DIAGNOSIS — F329 Major depressive disorder, single episode, unspecified: Secondary | ICD-10-CM | POA: Insufficient documentation

## 2018-11-05 DIAGNOSIS — E11319 Type 2 diabetes mellitus with unspecified diabetic retinopathy without macular edema: Secondary | ICD-10-CM

## 2018-11-05 DIAGNOSIS — I6300 Cerebral infarction due to thrombosis of unspecified precerebral artery: Secondary | ICD-10-CM

## 2018-11-05 DIAGNOSIS — Z590 Homelessness: Secondary | ICD-10-CM | POA: Diagnosis not present

## 2018-11-05 LAB — GLUCOSE, CAPILLARY: Glucose-Capillary: 372 mg/dL — ABNORMAL HIGH (ref 70–99)

## 2018-11-05 NOTE — Assessment & Plan Note (Addendum)
Patient admits to depressed mood due to his current medical conditions.  He states that he gets upset frequently because he is not able to paint or walk like he used to because of his chronic medical conditions.  Patient admits to previously having suicidal ideation, which he denies at this time.  He also denies having a plan to commit with suicide.  Patient's family was looking into a group home called [facility], but will need psychiatric evaluation prior to admission.    Plan: -Patient has already been referred to [facility] for behavioral health management and she has been made aware of his current situation.
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
## 2018-11-05 NOTE — Assessment & Plan Note (Signed)
Patient states that he is unsure if he is depressed or not.  He states that he has had a lot of recent changes in his life due to chronic medical conditions such as loss of vision which has significantly impacted his quality of life.  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  Since his last 2 strokes the patient has had to live with his sister.  He expresses anhedonia and worthlessness.  He denies changes in appetite or sleep.  Although he admits to thoughts of suicide in the past, he does not have a plan and denies suicidal ideation today.  Plan: - I will put in urgent referral to [facility] for behavioral health management.
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
## 2018-11-05 NOTE — Telephone Encounter (Signed)
This was addressed at today's [facility] visit. Referral was placed to [facility]. Hubbard Hartshorn, RN, BSN

## 2018-11-05 NOTE — Progress Notes (Signed)
   CC: Diabetes mellitus  HPI:  [name] is a 68 y.o. male with a past medical history stated below and presents today for follow-up for his diabetes. Please see problem based assessment and plan for additional details.   Past Medical History:  Diagnosis Date  . Acute diastolic congestive heart failure (Mark Cabrera)   . Aortic valve stenosis s/p AVR 2018  . Atrial fibrillation (Mark Cabrera) - post-op CABG    04/2016 CHA2DS2VAS score = 5  . Atypical nevi   . Coronary artery disease s/p 2 vessel CABG   . Depression    "years ago"  . Diabetes mellitus   . Dyspnea    in the past   . GERD (gastroesophageal reflux disease)   . Heart murmur   . Hepatic cirrhosis (Mark Cabrera) 06/29/2017  . History of kidney stones   . Hyperlipidemia    hx of transaminitis secondary to statin and he has decided not to use statins secondary to potential side effects.  . Hypertension   . Nephrolithiasis   . Osteoarthritis, knee   . Sleep apnea     Central apnea. Not using cpap  . Stroke Mark Cabrera) 2007  . Transaminitis     Statin-induced  . 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 2-D echo was normal no embolic source found.     Review of Systems: Review of Systems  Constitutional: Positive for malaise/fatigue. Negative for chills and fever.  Eyes: Positive for blurred vision.  Respiratory: Negative for cough and shortness of breath.   Cardiovascular: Negative for chest pain and palpitations.  Gastrointestinal: Negative for abdominal pain and vomiting.  Neurological: Positive for sensory change and weakness.  Psychiatric/Behavioral: Positive for depression and memory loss.     Vitals:   11/04/18 1552 11/04/18 1559  BP: (!) 131/105 (!)  165/91  Pulse: 71   Temp: 98.1 F (36.7 C)   TempSrc: Oral   SpO2: 99%   Weight: 210 lb (95.3 kg)   Height: 5\' 7"  (1.702 m)      Physical Exam: Physical Exam  Constitutional: He is oriented to person, place, and time.  Cardiovascular: Normal rate, regular rhythm, normal heart sounds and intact distal pulses. Exam reveals no gallop and no friction rub.  No murmur heard. Pulmonary/Chest: Effort normal. No respiratory distress.  Abdominal: Soft. There is no abdominal tenderness.  Neurological: He is alert and oriented to person, place, and time. He displays weakness. A sensory deficit is present.     Assessment & Plan:   See Encounters Tab for problem based charting.  Patient seen with Dr. Daryll Drown

## 2018-11-05 NOTE — Telephone Encounter (Signed)
Pt's daughter requesting to speak with Dr. [name] about the level of care and FL2, please call back.

## 2018-11-05 NOTE — Progress Notes (Signed)
Internal Medicine Clinic Attending  I saw and evaluated the patient.  I personally confirmed the key portions of the history and exam documented by Dr. Coe and I reviewed pertinent patient test results.  The assessment, diagnosis, and plan were formulated together and I agree with the documentation in the resident's note.    

## 2018-11-05 NOTE — Patient Instructions (Signed)
Thank you, [name] for allowing [place] to provide your care today. Today we discussed sisters living placement.    I have ordered today labs for you. I will call if any are abnormal.    I have place a referrals to please watch out for a phone call from [facility].  I have ordered the following tests: None today  We changes the following medications: None today  Please follow-up in 1 month.    Should you have any questions or concerns please call the internal medicine clinic at [phone].    Mark Cabrera, D.O. [facility]

## 2018-11-05 NOTE — Addendum Note (Signed)
Addended by: [name] B on: 11/05/2018 09:37 AM   Modules accepted: Level of Service

## 2018-11-06 NOTE — Telephone Encounter (Signed)
Daughter called again requesting FL2 be completed. States she needs to know what level of care Dr. [name] is recommending so she can call around for placement options. States the psych eval is only necessary for the place she found but other options may be better. Will place FL2 in Dr. Sammie Bench box. Hubbard Hartshorn, RN, BSN

## 2018-11-06 NOTE — Progress Notes (Signed)
   CC: Short-term memory loss  HPI:  Mark Cabrera is a 68 y.o. male with a past medical history stated below and presents today for short-term memory loss. Please see problem based assessment and plan for additional details.    Past Medical History:  Diagnosis Date  . Acute diastolic congestive heart failure ([place])   . Aortic valve stenosis s/p AVR 2018  . Atrial fibrillation ([place]) - post-op CABG    04/2016 CHA2DS2VAS score = 5  . Atypical nevi   . Coronary artery disease s/p 2 vessel CABG   . Depression    "years ago"  . Diabetes mellitus   . Dyspnea    in the past   . GERD (gastroesophageal reflux disease)   . Heart murmur   . Hepatic cirrhosis ([place]) 06/29/2017  . History of kidney stones   . Hyperlipidemia    hx of transaminitis secondary to statin and he has decided not to use statins secondary to potential side effects.  . Hypertension   . Nephrolithiasis   . Osteoarthritis, knee   . Sleep apnea     Central apnea. Not using cpap  . Stroke [facility]) 2007  . Transaminitis     Statin-induced  . 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 2-D echo was normal no embolic source found.      Review of Systems: Review of Systems  Constitutional: Negative for chills, fever and malaise/fatigue.  Respiratory: Negative for shortness of breath.   Cardiovascular: Negative for chest pain and leg swelling.  Psychiatric/Behavioral: Positive for depression.     Vitals:   11/05/18 0935  BP: 124/79  Pulse: 82  Temp: 98.1 F (36.7 C)  TempSrc: Oral  SpO2: 100%  Weight: 210 lb 6.4 oz (95.4 kg)     Physical Exam: Physical Exam  Constitutional: He is oriented to person, place, and time. No distress.   Cardiovascular: Normal rate, regular rhythm, normal heart sounds and intact distal pulses. Exam reveals no gallop and no friction rub.  No murmur heard. Pulmonary/Chest: Effort normal and breath sounds normal. No respiratory distress.  Abdominal: Soft. There is no abdominal tenderness.  Neurological: He is alert and oriented to person, place, and time.  Skin: Skin is warm and dry.     Assessment & Plan:   See Encounters Tab for problem based charting.  Patient seen with Dr. Evette Doffing

## 2018-11-07 ENCOUNTER — Telehealth: Payer: Self-pay | Admitting: Licensed Clinical Social Worker

## 2018-11-07 NOTE — Assessment & Plan Note (Signed)
Patient has uncontrolled diabetes with complication.  Previously seen at his clinic for preprocedural evaluation prior to undergoing eye surgery for diabetic retinopathy.  Once the patient has found placement in assisted living, will need to reevaluate his diabetes management.

## 2018-11-07 NOTE — Telephone Encounter (Signed)
Patient was called, and a vm was left due to the patient not answering.   Patient's daughter, Mark Cabrera, was contacted. Mark Cabrera is asking for the [facility] form to be completed so that she can identify an assisted living facility for her father.

## 2018-11-07 NOTE — Telephone Encounter (Signed)
FL2 form picked up by daughter 11/07/2018.

## 2018-11-07 NOTE — Progress Notes (Signed)
Internal Medicine Clinic Attending  I saw and evaluated the patient.  I personally confirmed the key portions of the history and exam documented by Dr. Coe and I reviewed pertinent patient test results.  The assessment, diagnosis, and plan were formulated together and I agree with the documentation in the resident's note.    

## 2018-11-12 ENCOUNTER — Encounter: Payer: Self-pay | Admitting: Licensed Clinical Social Worker

## 2018-11-12 ENCOUNTER — Ambulatory Visit (INDEPENDENT_AMBULATORY_CARE_PROVIDER_SITE_OTHER): Payer: Medicare Other | Admitting: Licensed Clinical Social Worker

## 2018-11-12 ENCOUNTER — Other Ambulatory Visit: Payer: Self-pay

## 2018-11-12 ENCOUNTER — Ambulatory Visit (INDEPENDENT_AMBULATORY_CARE_PROVIDER_SITE_OTHER): Payer: Medicare Other | Admitting: *Deleted

## 2018-11-12 DIAGNOSIS — F0631 Mood disorder due to known physiological condition with depressive features: Secondary | ICD-10-CM

## 2018-11-12 DIAGNOSIS — Z609 Problem related to social environment, unspecified: Secondary | ICD-10-CM

## 2018-11-12 DIAGNOSIS — Z0000 Encounter for general adult medical examination without abnormal findings: Secondary | ICD-10-CM
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
## 2018-11-19 DIAGNOSIS — Z0000 Encounter for general adult medical examination without abnormal findings: Secondary | ICD-10-CM

## 2018-11-19 LAB — TB SKIN TEST
Induration: 0 mm
TB Skin Test: NEGATIVE

## 2018-11-22 ENCOUNTER — Telehealth: Payer: Self-pay | Admitting: Internal Medicine

## 2018-11-25 ENCOUNTER — Other Ambulatory Visit: Payer: Self-pay | Admitting: Cardiology

## 2018-11-26 ENCOUNTER — Other Ambulatory Visit: Payer: Self-pay | Admitting: *Deleted

## 2018-11-26 MED ORDER — INSULIN PEN NEEDLE 31G X 5 MM MISC: 3 refills | Status: DC

## 2018-12-04 ENCOUNTER — Telehealth: Payer: Self-pay | Admitting: *Deleted

## 2018-12-04 NOTE — Telephone Encounter (Signed)
Dr Ronnald Ramp, pt's daughter [name], calls and states pt is now in [place], staying with a friend who was supposed to help care for him but he is having health issues, hi cbg's, vision problems, maybe more confusion. He states he wants to come back, he has only been there 2 days. She would like to find out about placement in facility, will need to pick up the paperwork that needed to be completed by this office and needs statement from [place]. About having a mental health diag. She would like to speak to ashtonh. If that is possible [place]

## 2018-12-05 ENCOUNTER — Encounter: Payer: Medicare Other | Admitting: Internal Medicine

## 2018-12-05 ENCOUNTER — Ambulatory Visit: Payer: Medicare Other | Admitting: Licensed Clinical Social Worker

## 2018-12-05 ENCOUNTER — Telehealth: Payer: Self-pay | Admitting: Licensed Clinical Social Worker

## 2018-12-05 NOTE — Telephone Encounter (Signed)
It looks like Dr. [name] was filling out FL2 paperwork for this patient at his appointment on 8/25. Was this already given back to the patient or his daughter?

## 2018-12-05 NOTE — Telephone Encounter (Signed)
Patient's daughter requested a call back. A vm was left for the patient's daughter letting her know she could request records from our office if she needed to.

## 2018-12-06 NOTE — Telephone Encounter (Signed)
Completed FL2 is in patient's chart under Media tab. Hubbard Hartshorn, BSN, RN-BC

## 2018-12-10 ENCOUNTER — Ambulatory Visit: Payer: Medicare Other | Admitting: Licensed Clinical Social Worker

## 2018-12-12 DIAGNOSIS — Z6830 Body mass index (BMI) 30.0-30.9, adult: Secondary | ICD-10-CM | POA: Diagnosis not present

## 2018-12-12 DIAGNOSIS — E663 Overweight: Secondary | ICD-10-CM | POA: Diagnosis not present

## 2018-12-12 DIAGNOSIS — Z0000 Encounter for general adult medical examination without abnormal findings: Secondary | ICD-10-CM | POA: Diagnosis not present

## 2018-12-12 DIAGNOSIS — Z23 Encounter for immunization: Secondary | ICD-10-CM | POA: Diagnosis not present

## 2018-12-12 DIAGNOSIS — N5089 Other specified disorders of the male genital organs: Secondary | ICD-10-CM | POA: Diagnosis not present

## 2018-12-12 DIAGNOSIS — R1032 Left lower quadrant pain: Secondary | ICD-10-CM | POA: Diagnosis not present

## 2018-12-16 DIAGNOSIS — H35372 Puckering of macula, left eye: Secondary | ICD-10-CM | POA: Diagnosis not present

## 2018-12-16 DIAGNOSIS — H43811 Vitreous degeneration, right eye: Secondary | ICD-10-CM | POA: Diagnosis not present

## 2018-12-16 DIAGNOSIS — E119 Type 2 diabetes mellitus without complications: Secondary | ICD-10-CM | POA: Diagnosis not present

## 2018-12-19 DIAGNOSIS — H35372 Puckering of macula, left eye: Secondary | ICD-10-CM | POA: Diagnosis not present

## 2018-12-23 DIAGNOSIS — I69859 Hemiplegia and hemiparesis following other cerebrovascular disease affecting unspecified side: Secondary | ICD-10-CM | POA: Diagnosis not present

## 2018-12-23 DIAGNOSIS — I25810 Atherosclerosis of coronary artery bypass graft(s) without angina pectoris: Secondary | ICD-10-CM | POA: Diagnosis not present

## 2018-12-23 DIAGNOSIS — Z952 Presence of prosthetic heart valve: Secondary | ICD-10-CM | POA: Diagnosis not present

## 2018-12-23 DIAGNOSIS — Z6830 Body mass index (BMI) 30.0-30.9, adult: Secondary | ICD-10-CM | POA: Diagnosis not present

## 2018-12-23 DIAGNOSIS — E663 Overweight: Secondary | ICD-10-CM | POA: Diagnosis not present

## 2018-12-23 DIAGNOSIS — E1165 Type 2 diabetes mellitus with hyperglycemia: Secondary | ICD-10-CM | POA: Diagnosis not present

## 2018-12-23 DIAGNOSIS — Z23 Encounter for immunization: Secondary | ICD-10-CM | POA: Diagnosis not present

## 2018-12-23 DIAGNOSIS — E1140 Type 2 diabetes mellitus with diabetic neuropathy, unspecified: Secondary | ICD-10-CM | POA: Diagnosis not present

## 2018-12-23 DIAGNOSIS — Z0000 Encounter for general adult medical examination without abnormal findings: Secondary | ICD-10-CM | POA: Diagnosis not present

## 2018-12-23 DIAGNOSIS — E7800 Pure hypercholesterolemia, unspecified: Secondary | ICD-10-CM | POA: Diagnosis not present

## 2018-12-23 DIAGNOSIS — I10 Essential (primary) hypertension: Secondary | ICD-10-CM | POA: Diagnosis not present

## 2018-12-23 DIAGNOSIS — Z01818 Encounter for other preprocedural examination: Secondary | ICD-10-CM | POA: Diagnosis not present

## 2018-12-24 DIAGNOSIS — R5383 Other fatigue: Secondary | ICD-10-CM | POA: Diagnosis not present

## 2018-12-24 DIAGNOSIS — I340 Nonrheumatic mitral (valve) insufficiency: Secondary | ICD-10-CM | POA: Diagnosis not present

## 2018-12-24 DIAGNOSIS — R9431 Abnormal electrocardiogram [ECG] [EKG]: Secondary | ICD-10-CM | POA: Diagnosis not present

## 2018-12-24 DIAGNOSIS — Z954 Presence of other heart-valve replacement: Secondary | ICD-10-CM | POA: Diagnosis not present

## 2018-12-24 DIAGNOSIS — R010 Benign and innocent cardiac murmurs: Secondary | ICD-10-CM | POA: Diagnosis not present

## 2018-12-24 DIAGNOSIS — I480 Paroxysmal atrial fibrillation: Secondary | ICD-10-CM | POA: Diagnosis not present

## 2018-12-24 DIAGNOSIS — I517 Cardiomegaly: Secondary | ICD-10-CM | POA: Diagnosis not present

## 2018-12-24 DIAGNOSIS — Z6831 Body mass index (BMI) 31.0-31.9, adult: Secondary | ICD-10-CM | POA: Diagnosis not present

## 2018-12-24 DIAGNOSIS — I361 Nonrheumatic tricuspid (valve) insufficiency: Secondary | ICD-10-CM | POA: Diagnosis not present

## 2018-12-24 DIAGNOSIS — I25810 Atherosclerosis of coronary artery bypass graft(s) without angina pectoris: Secondary | ICD-10-CM | POA: Diagnosis not present

## 2019-01-08 DIAGNOSIS — I25810 Atherosclerosis of coronary artery bypass graft(s) without angina pectoris: Secondary | ICD-10-CM | POA: Diagnosis not present

## 2019-01-09 DIAGNOSIS — R202 Paresthesia of skin: Secondary | ICD-10-CM | POA: Diagnosis not present

## 2019-01-09 DIAGNOSIS — I249 Acute ischemic heart disease, unspecified: Secondary | ICD-10-CM | POA: Diagnosis not present

## 2019-01-09 DIAGNOSIS — E1142 Type 2 diabetes mellitus with diabetic polyneuropathy: Secondary | ICD-10-CM | POA: Diagnosis not present

## 2019-01-09 DIAGNOSIS — G459 Transient cerebral ischemic attack, unspecified: Secondary | ICD-10-CM | POA: Diagnosis not present

## 2019-01-09 DIAGNOSIS — I214 Non-ST elevation (NSTEMI) myocardial infarction: Secondary | ICD-10-CM | POA: Diagnosis not present

## 2019-01-09 DIAGNOSIS — I5032 Chronic diastolic (congestive) heart failure: Secondary | ICD-10-CM | POA: Diagnosis not present

## 2019-01-09 DIAGNOSIS — N179 Acute kidney failure, unspecified: Secondary | ICD-10-CM | POA: Diagnosis not present

## 2019-01-10 DIAGNOSIS — I5032 Chronic diastolic (congestive) heart failure: Secondary | ICD-10-CM | POA: Diagnosis present

## 2019-01-10 DIAGNOSIS — I25119 Atherosclerotic heart disease of native coronary artery with unspecified angina pectoris: Secondary | ICD-10-CM | POA: Diagnosis present

## 2019-01-10 DIAGNOSIS — E785 Hyperlipidemia, unspecified: Secondary | ICD-10-CM | POA: Diagnosis present

## 2019-01-10 DIAGNOSIS — I249 Acute ischemic heart disease, unspecified: Secondary | ICD-10-CM | POA: Diagnosis present

## 2019-01-10 DIAGNOSIS — G459 Transient cerebral ischemic attack, unspecified: Secondary | ICD-10-CM | POA: Diagnosis present

## 2019-01-10 DIAGNOSIS — I214 Non-ST elevation (NSTEMI) myocardial infarction: Secondary | ICD-10-CM | POA: Diagnosis present

## 2019-01-10 DIAGNOSIS — N179 Acute kidney failure, unspecified: Secondary | ICD-10-CM | POA: Diagnosis present

## 2019-01-10 DIAGNOSIS — Z7901 Long term (current) use of anticoagulants: Secondary | ICD-10-CM | POA: Diagnosis not present

## 2019-01-10 DIAGNOSIS — I350 Nonrheumatic aortic (valve) stenosis: Secondary | ICD-10-CM | POA: Diagnosis present

## 2019-01-10 DIAGNOSIS — E876 Hypokalemia: Secondary | ICD-10-CM | POA: Diagnosis present

## 2019-01-10 DIAGNOSIS — E1142 Type 2 diabetes mellitus with diabetic polyneuropathy: Secondary | ICD-10-CM | POA: Diagnosis present

## 2019-01-10 DIAGNOSIS — Z923 Personal history of irradiation: Secondary | ICD-10-CM | POA: Diagnosis not present

## 2019-01-10 DIAGNOSIS — E1165 Type 2 diabetes mellitus with hyperglycemia: Secondary | ICD-10-CM | POA: Diagnosis present

## 2019-01-10 DIAGNOSIS — R531 Weakness: Secondary | ICD-10-CM | POA: Diagnosis not present

## 2019-01-10 DIAGNOSIS — Z953 Presence of xenogenic heart valve: Secondary | ICD-10-CM | POA: Diagnosis not present

## 2019-01-10 DIAGNOSIS — Z885 Allergy status to narcotic agent status: Secondary | ICD-10-CM | POA: Diagnosis not present

## 2019-01-10 DIAGNOSIS — R079 Chest pain, unspecified: Secondary | ICD-10-CM | POA: Diagnosis not present

## 2019-01-10 DIAGNOSIS — H547 Unspecified visual loss: Secondary | ICD-10-CM | POA: Diagnosis present

## 2019-01-10 DIAGNOSIS — Z952 Presence of prosthetic heart valve: Secondary | ICD-10-CM | POA: Diagnosis not present

## 2019-01-10 DIAGNOSIS — I110 Hypertensive heart disease with heart failure: Secondary | ICD-10-CM | POA: Diagnosis present

## 2019-01-10 DIAGNOSIS — M542 Cervicalgia: Secondary | ICD-10-CM | POA: Diagnosis not present

## 2019-01-10 DIAGNOSIS — Z794 Long term (current) use of insulin: Secondary | ICD-10-CM | POA: Diagnosis not present

## 2019-01-10 DIAGNOSIS — M545 Low back pain: Secondary | ICD-10-CM | POA: Diagnosis not present

## 2019-01-10 DIAGNOSIS — Z8673 Personal history of transient ischemic attack (TIA), and cerebral infarction without residual deficits: Secondary | ICD-10-CM | POA: Diagnosis not present

## 2019-01-10 DIAGNOSIS — Z888 Allergy status to other drugs, medicaments and biological substances status: Secondary | ICD-10-CM | POA: Diagnosis not present

## 2019-01-10 DIAGNOSIS — Z951 Presence of aortocoronary bypass graft: Secondary | ICD-10-CM | POA: Diagnosis not present

## 2019-01-10 DIAGNOSIS — M19012 Primary osteoarthritis, left shoulder: Secondary | ICD-10-CM | POA: Diagnosis not present

## 2019-01-11 DIAGNOSIS — M542 Cervicalgia: Secondary | ICD-10-CM | POA: Diagnosis not present

## 2019-01-11 DIAGNOSIS — M545 Low back pain: Secondary | ICD-10-CM | POA: Diagnosis not present

## 2019-01-11 DIAGNOSIS — M19012 Primary osteoarthritis, left shoulder: Secondary | ICD-10-CM | POA: Diagnosis not present

## 2019-01-11 DIAGNOSIS — G459 Transient cerebral ischemic attack, unspecified: Secondary | ICD-10-CM | POA: Diagnosis not present

## 2019-01-20 ENCOUNTER — Ambulatory Visit (INDEPENDENT_AMBULATORY_CARE_PROVIDER_SITE_OTHER): Payer: Medicare Other | Admitting: Cardiovascular Disease

## 2019-01-20 ENCOUNTER — Telehealth: Payer: Self-pay | Admitting: *Deleted

## 2019-01-20 ENCOUNTER — Encounter: Payer: Self-pay | Admitting: Cardiovascular Disease

## 2019-01-20 ENCOUNTER — Other Ambulatory Visit: Payer: Self-pay

## 2019-01-20 VITALS — BP 120/78 | HR 79 | Ht 67.0 in | Wt 210.4 lb

## 2019-01-20 DIAGNOSIS — I2510 Atherosclerotic heart disease of native coronary artery without angina pectoris: Secondary | ICD-10-CM | POA: Diagnosis not present

## 2019-01-20 DIAGNOSIS — I639 Cerebral infarction, unspecified: Secondary | ICD-10-CM | POA: Diagnosis not present

## 2019-01-20 NOTE — Patient Instructions (Addendum)
Medication Instructions:  No changes today *If you need a refill on your cardiac medications before your next appointment, please call your pharmacy*  Lab Work: None today If you have labs (blood work) drawn today and your tests are completely normal, you will receive your results only by: Marland Kitchen MyChart Message (if you have MyChart) OR . A paper copy in the mail If you have any lab test that is abnormal or we need to change your treatment, we will call you to review the results.  Testing/Procedures: none  Follow-Up: At [facility], you and your health needs are our priority.  As part of our continuing mission to provide you with exceptional heart care, we have created designated Provider Care Teams.  These Care Teams include your primary Cardiologist (physician) and Advanced Practice Providers (APPs -  Physician Assistants and Nurse Practitioners) who all work together to provide you with the care you need, when you need it.  Your next appointment:   2-3 months  The format for your next appointment:   In Person  Provider:   Lauree Chandler, MD  Other Instructions We are requesting hospital records from [facility] in [place], [place] from Oct-Nov 2020.  Release signed today for authorization to release records.

## 2019-01-20 NOTE — Telephone Encounter (Signed)
The patient was transferred to me and then put his sister on the phone to review his mediations.  She wanted to know if she should continue to follow the list that was given to him in [place] as well as the old list from Dr. [name].  I adv her to use the medication list that was given to the patient today and only follow that list.  She verbalizes understanding.

## 2019-01-20 NOTE — Progress Notes (Addendum)
Chief Complaint  Patient presents with  . Follow-up    CAD   History of Present Illness: 68 yo male with history of HTN, HLD, atrial fibrillation, DM, severe aortic stenosis now s/p AVR January 2018, CAD s/p 2V CABG January 2018, prior CVA here today for cardiac follow up. He was admitted to [facility] January 2018 and underwent surgical AVR for AS and 2V CABG (SVG to PDA and SVG to OM1) along with left atrial appendage clipping per Dr. Roxan Hockey. He has post-op atrial fib and volume overload. His post-operative course was also complicated by a large pericardial effusion and pleural effusion. He was readmitted and had thoracentesis and pericardial window. He had issues with volume overload post-op and was started on Lasix. Last echo April 2020 with LVEF=50-55%, severe LVH, AV bioprosthetic valve working well. The left atrium was severely dilated. Ongoing chest pain since surgery. Sternal wire removal June 2019. Stroke April 2020 felt to be due to an embolic event. He was started on Eliquis. He was admitted to [facility] with chest pain 10/30/. Cardiac cath 01/10/19 with occlusion of the SVG to the OM, severe stenosis in the body of the SVG to the PDA and CTO of the LAD. A drug eluting stent was placed in the mid LAD (.275 x 32 mm Synergy DES) and a drug eluting stent was placed in the body of the SVG to the PDA (3.5 x 20 mm Synergy DES).  (Dr. Sindy Guadeloupe).  He was discharged on ASA and Plavix. He was hoping to have an eye procedure while in [place] last week but his cardiac event has postponed this.   He is here today for follow up. The patient denies any chest pain, dyspnea, palpitations, lower extremity edema, orthopnea, PND, dizziness, near syncope or syncope.   Primary Care Physician: Ladona Horns, MD  Past Medical History:  Diagnosis Date  . Acute diastolic congestive heart failure ([place])   . Aortic valve stenosis s/p AVR 2018  . Atrial fibrillation ([place]) - post-op CABG    04/2016  CHA2DS2VAS score = 5  . Atypical nevi   . Coronary artery disease s/p 2 vessel CABG   . Depression    "years ago"  . Diabetes mellitus   . Dyspnea    in the past   . GERD (gastroesophageal reflux disease)   . Heart murmur   . Hepatic cirrhosis ([place]) 06/29/2017  . History of kidney stones   . Hyperlipidemia    hx of transaminitis secondary to statin and he has decided not to use statins secondary to potential side effects.  . Hypertension   . Nephrolithiasis   . Osteoarthritis, knee   . Sleep apnea     Central apnea. Not using cpap  . Stroke [facility]) 2007  . Transaminitis     Statin-induced  . 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 2-D echo was normal no embolic source found.    Past Surgical History:  Procedure Laterality Date  . AORTIC VALVE REPLACEMENT N/A 03/30/2016   Procedure: AORTIC VALVE REPLACEMENT (AVR);  Surgeon: [name], MD;  Location: [place];  Service: Open Heart Surgery;  Laterality: N/A;  . CARDIAC CATHETERIZATION N/A 03/15/2016   Procedure: Right/Left Heart Cath and  Coronary Angiography;  Surgeon: Burnell Blanks, MD;  Location: [place] CV LAB;  Service: Cardiovascular;  Laterality: N/A;  . CLIPPING OF ATRIAL APPENDAGE N/A 03/30/2016   Procedure: CLIPPING OF LEFT ATRIAL APPENDAGE;  Surgeon: [name], MD;  Location: [place];  Service: Open Heart Surgery;  Laterality: N/A;  . CORONARY ARTERY BYPASS GRAFT N/A 03/30/2016   Procedure: CORONARY ARTERY BYPASS GRAFTING (CABG) Times Two;  Surgeon: [name], MD;  Location: [place];  Service: Open Heart Surgery;  Laterality: N/A;  . KNEE ARTHROSCOPY W/ PARTIAL MEDIAL MENISCECTOMY  05/12/2005   right, performed by  Dr. French Ana for torn medial meniscus.  Marland Kitchen ROTATOR CUFF REPAIR Right 2016  . STERNAL WIRES REMOVAL N/A 08/13/2017   Procedure: STERNAL WIRES REMOVAL;  Surgeon: [name], MD;  Location: [place];  Service: Thoracic;  Laterality: N/A;  . SUBXYPHOID PERICARDIAL WINDOW N/A 04/21/2016   Procedure: SUBXYPHOID PERICARDIAL WINDOW;  Surgeon: [name], MD;  Location: [place];  Service: Thoracic;  Laterality: N/A;  . TEE WITHOUT CARDIOVERSION N/A 03/30/2016   Procedure: TRANSESOPHAGEAL ECHOCARDIOGRAM (TEE);  Surgeon: [name], MD;  Location: [place];  Service: Open Heart Surgery;  Laterality: N/A;  . TEE WITHOUT CARDIOVERSION N/A 04/21/2016   Procedure: TRANSESOPHAGEAL ECHOCARDIOGRAM (TEE);  Surgeon: [name], MD;  Location: [facility] OR;  Service: Thoracic;  Laterality: N/A;    Current Outpatient Medications  Medication Sig Dispense Refill  . acetaminophen (TYLENOL) 325 MG tablet Take 2 tablets (650 mg total) by mouth every 4 (four) hours as needed for headache or mild pain.    Marland Kitchen aspirin EC 81 MG tablet Take 1 tablet (81 mg total) by mouth daily. 90 tablet 3  . atorvastatin (LIPITOR) 80 MG tablet Take 1 tablet (80 mg total) by mouth daily at 6 PM. 30 tablet 0  . BAYER MICROLET LANCETS lancets Check blood sugar 3 times a day as instructed 100 each 12  . clopidogrel (PLAVIX) 75 MG tablet Take 75 mg by mouth daily.    Marland Kitchen ELIQUIS 5 MG TABS tablet Take 1 tablet by mouth twice daily 60 tablet 2  . Evolocumab 140 MG/ML SOAJ Inject into the skin. Has not started medication yet    . ezetimibe (ZETIA) 10 MG tablet Take 10 mg by mouth at bedtime.    . furosemide (LASIX) 40 MG tablet Take 1 tablet by mouth daily.    Marland Kitchen gabapentin (NEURONTIN) 100 MG capsule Take 1 capsule (100 mg total) by mouth 3 (three) times daily. 270 capsule 2  . glucose blood (CONTOUR NEXT TEST) test strip Check blood sugar 3 times a day as instructed 100 each 12  . hydrochlorothiazide (HYDRODIURIL) 12.5 MG tablet  Take 2 tablets (25 mg total) by mouth daily. 90 tablet 1  . Insulin Glargine (LANTUS) 100 UNIT/ML Solostar Pen Inject 25 Units into the skin 2 (two) times daily. 15 mL 11  . insulin lispro (HUMALOG KWIKPEN) 100 UNIT/ML KwikPen Inject 0.15 mLs (15 Units total) into the skin 3 (three) times daily. 15 mL 11  . Insulin Pen Needle 31G X 5 MM MISC Use one pen needle three times daily. Dx E11.49 100 each 3  . lisinopril (ZESTRIL) 10 MG tablet Take 10 mg by mouth daily.    Marland Kitchen neomycin-polymyxin-hydrocortisone (CORTISPORIN) 3.5-10000-1 OTIC suspension Place 3 drops into the left ear 4 (four) times daily. 10 mL 0  . nitroGLYCERIN (NITROSTAT) 0.4 MG SL tablet PLACE 1 TABLET UNDER THE TONGUE EVERY 5 MINUTES  AS NEEDED FOR CHEST PAIN 25 tablet 0   No current facility-administered medications for this visit.     Allergies  Allergen Reactions  . Penicillins Other (See Comments)    UNSPECIFIED REACTIONS  Has patient had a PCN reaction causing immediate rash, facial/tongue/throat swelling, SOB or lightheadedness with hypotension: Unk Has patient had a PCN reaction causing severe rash involving mucus membranes or skin necrosis: Unk Has patient had a PCN reaction that required hospitalization: Unk Has patient had a PCN reaction occurring within the last 10 years: No If all of the above answers are "NO", then may proceed with Cephalosporin use.   . Statins Other (See Comments)    Severe rash and back pain  . Morphine And Related Other (See Comments)    hallucinations  . Sglt2 Inhibitors Rash    Candidiasis infection prone    Social History   Socioeconomic History  . Marital status: Divorced    Spouse name: Not on file  . Number of children: 1  . Years of education: 2y college  . Highest education level: Not on file  Occupational History  . Occupation:  Medicaid [place]    Employer: UNEMPLOYED    Comment: following stroke in 2007  Social Needs  . Financial resource strain: Not hard at all  .  Food insecurity    Worry: Never true    Inability: Never true  . Transportation needs    Medical: No    Non-medical: No  Tobacco Use  . Smoking status: Never Smoker  . Smokeless tobacco: Never Used  Substance and Sexual Activity  . Alcohol use: Yes    Alcohol/week: 1.0 standard drinks    Types: 1 Cans of beer per week    Comment: occasional  . Drug use: No  . Sexual activity: Not Currently  Lifestyle  . Physical activity    Days per week: 0 days    Minutes per session: 0 min  . Stress: Only a little  Relationships  . Social connections    Talks on phone: More than three times a week    Gets together: Once a week    Attends religious service: Never    Active member of club or organization: No    Attends meetings of clubs or organizations: Never    Relationship status: Not on file  . Intimate partner violence    Fear of current or ex partner: Not on file    Emotionally abused: Not on file    Physically abused: Not on file    Forced sexual activity: Not on file  Other Topics Concern  . Not on file  Social History Narrative   Single but has a girlfriend.    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       Patient has medications disability post stroke.             Family History  Problem Relation Age of Onset  . Hypertension Mother   . Diabetes Mother   . Alcohol abuse Father     Review of Systems:  As stated in the HPI and otherwise negative.   BP 120/78   Pulse 79   Ht 5\' 7"  (1.702 m)   Wt 210 lb 6.4 oz (95.4 kg)   SpO2 97%   BMI 32.95 kg/m   Physical Examination:  General: Well developed, well nourished, NAD  HEENT: OP clear, mucus membranes moist  SKIN: warm, dry. No rashes. Neuro: No  focal deficits  Musculoskeletal: Muscle strength 5/5 all ext  Psychiatric: Mood and affect normal  Neck: No JVD, no carotid bruits, no thyromegaly, no lymphadenopathy.  Lungs:Clear bilaterally, no wheezes, rhonci, crackles  Cardiovascular: Regular rate and rhythm. No murmurs, gallops or rubs. Abdomen:Soft. Bowel sounds present. Non-tender.  Extremities: No lower extremity edema. Pulses are 2 + in the bilateral DP/PT.  Echo April 2020:  1. The left ventricle has low normal systolic function, with an ejection fraction of 50-55%. The cavity size was mildly dilated. There is severely increased left ventricular wall thickness. Left ventricular diastolic Doppler parameters are consistent  with pseudonormalization. Elevated left ventricular end-diastolic pressure.  2. The right ventricle has normal systolic function. The cavity was normal. There is no increase in right ventricular wall thickness.  3. Left atrial size was severely dilated.  4. Right atrial size was mildly dilated.  5. The mitral valve is degenerative. Mild thickening of the mitral valve leaflet. Mild calcification of the mitral valve leaflet.  6. The aortic valve was not well visualized. Moderate thickening of the aortic valve. Moderate calcification of the aortic valve. Mild stenosis of the aortic valve.  7. The interatrial septum was not well visualized.  EKG:  EKG is ordered today. The ekg ordered today demonstrates Sinus rate 79 bpm. LVH  Recent Labs: 07/02/2018: ALT 18; Hemoglobin 12.2; Platelets 165 10/15/2018: BUN 20; Creatinine, Ser 1.00; Potassium 4.6; Sodium 140   Lipid Panel    Component Value Date/Time   CHOL 274 (H) 06/29/2018 0420   CHOL 323 (H) 01/18/2018 0900   TRIG 242 (H) 06/29/2018 0420   HDL 45 06/29/2018 0420   HDL 52 01/18/2018 0900   CHOLHDL 6.1 06/29/2018 0420   VLDL 48 (H) 06/29/2018 0420   LDLCALC 181 (H) 06/29/2018 0420   LDLCALC 223 (H) 01/18/2018 0900     Wt Readings from Last 3 Encounters:  01/20/19 210 lb 6.4 oz (95.4 kg)  11/05/18 210 lb 6.4 oz (95.4 kg)  11/04/18 210 lb (95.3 kg)     Other studies Reviewed: Additional studies/ records that were reviewed today include: . Review of the above records  demonstrates:   Assessment and Plan:   1. CAD without angina: No chest pain post recent PCI/stenting of the mid LAD and SVG to PDA in an outside hospital. Continue ASA, Plavix, statin and beta blocker.      2. Pericardial effusion: Resolved s/p pericardial window.   3. Aortic stenosis: s/p surgical AVR with bioprosthetic AVR. His bioprosthetic valve is working well by echo in April 2020. Will try to get echo report from outside hospital.   4. Atrial fib, paroxysmal: sinus today. He is on Eliquis and Coreg. He had a stroke in April 2020.   5. HTN: BP is controlled.   6. Hyperlipidemia: Will get lipids from recent hospitalization. Continue Lipitor, Zetia and Repatha (started in [place]).  Will need lipids and LFTS in 12 weeks.   7. Chronic diastolic CHF: Volume status is ok. Continue Lasix  Will get records from outside hospital.   Current medicines are reviewed at length with the patient today.  The patient does not have concerns regarding medicines.  The following changes have been made:  no change  Labs/ tests ordered today include:   No orders of the defined types were placed in this encounter.  Disposition:   FU with me in 12 months   Signed, Lauree Chandler, MD 01/20/2019 4:10 PM    [id number]  [address], [place], [place]  [address] Phone: [phone]; Fax: [phone]

## 2019-01-27 ENCOUNTER — Telehealth: Payer: Self-pay | Admitting: Cardiovascular Disease

## 2019-01-27 NOTE — Telephone Encounter (Signed)
New Message:        Please call, question about the medicine he picked up from the pharmacy(Clopidogrel).

## 2019-01-27 NOTE — Telephone Encounter (Signed)
Spoke with patient who asked about 2 medications. He wanted to clarify he was to take clopidogrel since he takes aspirin and eliquis and I confirmed he is to take all of those.  He also picked up metoprolol 25 mg and noticed that is not on his medication list.  He thinks this was prescribed in [place].  I adv pt to talk with his sister in law who has been filling his pillbox for "years".  If this is not a new medication and he has been taking it then he should continue.  There is no other beta blocker on his med list.   Pt will check with his sister.

## 2019-01-28 ENCOUNTER — Encounter: Payer: Self-pay | Admitting: Dietician

## 2019-01-28 ENCOUNTER — Ambulatory Visit (INDEPENDENT_AMBULATORY_CARE_PROVIDER_SITE_OTHER): Payer: Medicare Other | Admitting: Dietician

## 2019-01-28 ENCOUNTER — Other Ambulatory Visit: Payer: Self-pay

## 2019-01-28 ENCOUNTER — Other Ambulatory Visit: Payer: Self-pay | Admitting: Dietician

## 2019-01-28 VITALS — Wt 214.9 lb

## 2019-01-28 DIAGNOSIS — E1140 Type 2 diabetes mellitus with diabetic neuropathy, unspecified: Secondary | ICD-10-CM

## 2019-01-28 DIAGNOSIS — Z713 Dietary counseling and surveillance: Secondary | ICD-10-CM

## 2019-01-28 DIAGNOSIS — Z794 Long term (current) use of insulin: Secondary | ICD-10-CM

## 2019-01-28 DIAGNOSIS — Z6833 Body mass index (BMI) 33.0-33.9, adult: Secondary | ICD-10-CM

## 2019-01-28 LAB — POCT GLYCOSYLATED HEMOGLOBIN (HGB A1C): Hemoglobin A1C: 9.3 % — AB (ref 4.0–5.6)

## 2019-01-28 LAB — GLUCOSE, CAPILLARY: Glucose-Capillary: 225 mg/dL — ABNORMAL HIGH (ref 70–99)

## 2019-01-28 NOTE — Progress Notes (Signed)
No. You do not need to . I just wanted to share the information for when you see him in two weeks. Thank you!

## 2019-01-28 NOTE — Progress Notes (Signed)
Mark Cabrera has recently relocated back to [place] from [place].  At today's visit, he asked to be referred to diabetes classes so the person he is living with can attend and learn to cook for him. I offered to refer him to NDEs vs that person can attend 1:1 sessions with me to learn how to cook for him .  Other concerns were to get his diabetes under better control, vision problems, cannot see to care for his toenails. I offered a foot check today but he asked to defer this to his visit with Dr. Ronnald Ramp.  He also asks for help in improving his blood sugars. He thinks he needs a change I his medication and also remembering to take it even though he reports taking it as ordered. His blood sugar today in office was 220 after eating an apple for breakfast.  He reports cutting back on gabapentin to see if this may help his memory problems.  He has an appointment with Dr. Baird Cancer on 02/05/19.  He keeps his insulin in the refrigerator, reports his sister fills pillbox when she picks up his medicine for him.   Suggest simplest diabetes regimen for him- less injections such as a longer acting basal insulin so he only has to take one injection per day vs. consider a combination of GLP-1 and insulin vs weekly GLP-1. He was on metformin with good tolerance in the past. He is agreeable to Continuous glucose monitoring. I suggest placing this when he sees Dr. Ronnald Ramp in December.   Lab Results  Component Value Date   HGBA1C 9.3 (A) 01/28/2019   HGBA1C 10.8 (A) 09/20/2018   HGBA1C 12.0 (H) 06/29/2018   HGBA1C 7.6 (A) 03/28/2018   HGBA1C 8.8 (A) 12/13/2017    Diabetes Self-Management Education  Visit Type: (P) Follow-up  Appt. Start Time: 815 Appt. End Time: [id number]  01/28/2019  [name], identified by name and date of birth, is a 68 y.o. male with a diagnosis of Diabetes:  .   ASSESSMENT  Weight 214 lb 14.4 oz (97.5 kg). Body mass index is 33.66 kg/m.  Diabetes Self-Management Education -  01/28/19 1000      Visit Information   Visit Type  Follow-up  (Pended)       Psychosocial Assessment   Patient Belief/Attitude about Diabetes  Motivated to manage diabetes  (Pended)     Self-care barriers  Impaired vision;Debilitated state due to current medical condition;Lack of transportation;Unsteady gait/risk for falls;Lack of material resources  (Pended)     Self-management support  Doctor's office;Friends;Family;CDE visits  (Pended)       Subsequent Visit   Since your last visit have you continued or begun to take your medications as prescribed?  Yes  (Pended)    he says yes, but also that he is very forgetful   Since your last visit have you had your blood pressure checked?  Yes  (Pended)    in [place]   Is your most recent blood pressure lower, unchanged, or higher since your last visit?  Unchanged  (Pended)     Since your last visit have you experienced any weight changes?  No change  (Pended)     Since your last visit, are you checking your blood glucose at least once a day?  No  (Pended)    missed 13 days, but average is ~1x/day      Individualized Plan for Diabetes Self-Management Training:   Learning Objective:  Patient will have a greater understanding  of diabetes self-management. Patient education plan is to attend individual and/or group sessions per assessed needs and concerns.   Plan:   There are no Patient Instructions on file for this visit.  Expected Outcomes:     Education material provided: Diabetes Resources  If problems or questions, patient to contact team via:  Phone  Future DSME appointment:   same day he sees Dr. Elby Showers Plyler, RD 01/28/2019 11:18 AM.

## 2019-01-28 NOTE — Progress Notes (Unsigned)
Referral request

## 2019-02-01 DIAGNOSIS — R0789 Other chest pain: Secondary | ICD-10-CM | POA: Diagnosis not present

## 2019-02-01 DIAGNOSIS — Z743 Need for continuous supervision: Secondary | ICD-10-CM | POA: Diagnosis not present

## 2019-02-01 DIAGNOSIS — R079 Chest pain, unspecified: Secondary | ICD-10-CM | POA: Diagnosis not present

## 2019-02-03 ENCOUNTER — Telehealth: Payer: Self-pay | Admitting: Internal Medicine

## 2019-02-03 NOTE — Telephone Encounter (Signed)
Pls contact pt [phone]; per pt missed, for assisted living [phone]

## 2019-02-04 NOTE — Telephone Encounter (Signed)
Patient called back. No answer, vm was left for the patient to contact our office back if he needs to speak with me.

## 2019-02-05 ENCOUNTER — Telehealth: Payer: Self-pay | Admitting: *Deleted

## 2019-02-05 ENCOUNTER — Telehealth: Payer: Self-pay | Admitting: Internal Medicine

## 2019-02-05 DIAGNOSIS — H26493 Other secondary cataract, bilateral: Secondary | ICD-10-CM | POA: Diagnosis not present

## 2019-02-05 DIAGNOSIS — H35372 Puckering of macula, left eye: Secondary | ICD-10-CM | POA: Diagnosis not present

## 2019-02-05 DIAGNOSIS — H3581 Retinal edema: Secondary | ICD-10-CM | POA: Diagnosis not present

## 2019-02-05 DIAGNOSIS — E113292 Type 2 diabetes mellitus with mild nonproliferative diabetic retinopathy without macular edema, left eye: Secondary | ICD-10-CM | POA: Diagnosis not present

## 2019-02-05 NOTE — Telephone Encounter (Signed)
Rec'd call from pt requesting a phone back in Reference to an [facility] Form being completed as soon as possible.  Pt states that [facility]isted [facility] in [place]  has 2 open beds and they are requesting an FL2 form be completed and submitted to their facility by this week if possible.  The patient has left a phone number of [phone] , and a Fax number [phone] to contact their facility.

## 2019-02-05 NOTE — Telephone Encounter (Signed)
   [facility] Pre-operative Risk Assessment    Request for surgical clearance:  1. What type of surgery is being performed? VITRECTOMY, MEMBRANE PEEL, INTRAVITREAL EYLEA and REMOVAL OF POSTERIOR CAPSULAR OPACIFICATION IN THE LEFT EYE  2. When is this surgery scheduled? TBD   3. What type of clearance is required (medical clearance vs. Pharmacy clearance to hold med vs. Both)? BOTH  4. Are there any medications that need to be held prior to surgery and how long? ELIQUIS X 3 DAYS PRIOR AND HOLD PLAVIX X 5 DAYS PRIOR TO PROCEDURE  5. Practice name and name of physician performing surgery? [facility] DR. Corene Cornea S[place]ERS   6. What is your office phone number [phone]    7.   What is your office fax number [phone]  8.   Anesthesia type (None, local, MAC, general) ? MAC   Julaine Hua 02/05/2019, 2:04 PM  _________________________________________________________________   (provider comments below)

## 2019-02-05 NOTE — Telephone Encounter (Signed)
Pt takes Eliquis for afib with CHADS2VASc score of 7 (age, CHF, HTN, CAD, DM, CVA in April 2020). Renal function is normal. Recommend only holding Eliquis for 1 day prior to occular procedure due to patient's elevated cardiac risk. If longer hold is needed, will need input from MD.

## 2019-02-10 NOTE — Telephone Encounter (Signed)
FL2 completed on 11/07/2018 was faxed to [facility] on 02/05/2019 by [employer]. Hubbard Hartshorn, BSN, RN-BC

## 2019-02-10 NOTE — Telephone Encounter (Signed)
   Primary Cardiologist: Lauree Chandler, MD  Chart reviewed as part of pre-operative protocol coverage. Patient has a history of CAD s/p CABG x2 in 03/2016 and multiple recent stents in 12/2018, aortic stenosis s/p AVR at time of CABG, paroxysmal atrial fibrillation on Eliquis, hypertension, hyperlipidemia, and diabetes. 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 Patient recently admitted to [facility] in 12/2018 for chest pain. He had cardiac catheterization on 01/10/2019 which showed occlusion of the SVG to the OM, severe stenosis in the body of the SVG to the PDA and CTO of the LAD. A drug eluting stent was placed in the mid LAD (.275 x 32 mm Synergy DES) and a drug eluting stent was placed in the body of the SVG to the PDA (3.5 x 20 mm Synergy DES). He was discharged on Aspirin and Plavix in addition to his home Eliquis. He was seen by Dr. [name] on 01/20/2019 at which time he was doing well and denied any cardiac symptoms.  Patient called office today to see when he could be cleared for surgery as vision in his left eye has gotten so bad that he is not able to complete basic activities by himself. He has plans to move into an assisted living facility until he can have his eye surgery done. I informed patient that given that he just had multiple stents placed, it would be unsafe to stop his antiplatelets at this time for any surgeries unless it was an emergency. He already has an appointment with Dr. [name] planned for 04/13/2018. Advised patient to keep this appointment and can discuss when it will be safe to come off Plavix at that time. Patient voiced understanding and agreed.   Will route note to Dr. [name] so that he is aware and will remove from pre-op pool.   Patient also wondering if he needs to be on Aspirin, Plavix, and Eliquis. Advised patient that  we will often have patients stop Aspirin 1 month after PCI  if they are also on Plavix and Eliquis. However, no mention of this in last office visit note, so I will also confirm this with Dr. [name]. Patient knows to continue triple therapy for now.

## 2019-02-10 NOTE — Telephone Encounter (Signed)
He can stop his ASA now. MIchalene, can we let him know? Thanks, chris

## 2019-02-10 NOTE — Telephone Encounter (Signed)
Left message to call back  

## 2019-02-12 NOTE — Telephone Encounter (Signed)
Called back to home number to leave a message per DPR. Pt picked up and I gave him information. He will tell his sister in law who fixes his pill boxes that he is to stop aspirin.

## 2019-02-12 NOTE — Addendum Note (Signed)
Addended by: Rodman Key on: 02/12/2019 11:32 AM   Modules accepted: Orders

## 2019-02-12 NOTE — Telephone Encounter (Signed)
Message left on home number to call back. Mobile number message identified a name different than the patient's.  No vm left on this number.

## 2019-02-13 ENCOUNTER — Encounter: Payer: Self-pay | Admitting: Internal Medicine

## 2019-02-13 ENCOUNTER — Ambulatory Visit (INDEPENDENT_AMBULATORY_CARE_PROVIDER_SITE_OTHER): Payer: Medicare Other | Admitting: Internal Medicine

## 2019-02-13 ENCOUNTER — Other Ambulatory Visit: Payer: Self-pay

## 2019-02-13 ENCOUNTER — Encounter: Payer: Self-pay | Admitting: Dietician

## 2019-02-13 ENCOUNTER — Ambulatory Visit: Payer: Medicare Other | Admitting: Dietician

## 2019-02-13 ENCOUNTER — Ambulatory Visit (INDEPENDENT_AMBULATORY_CARE_PROVIDER_SITE_OTHER): Payer: Medicare Other | Admitting: Dietician

## 2019-02-13 VITALS — BP 126/68 | HR 72 | Temp 98.0°F | Ht 67.0 in | Wt 211.4 lb

## 2019-02-13 DIAGNOSIS — E1149 Type 2 diabetes mellitus with other diabetic neurological complication: Secondary | ICD-10-CM | POA: Diagnosis not present

## 2019-02-13 DIAGNOSIS — Z8673 Personal history of transient ischemic attack (TIA), and cerebral infarction without residual deficits: Secondary | ICD-10-CM | POA: Diagnosis not present

## 2019-02-13 DIAGNOSIS — I2510 Atherosclerotic heart disease of native coronary artery without angina pectoris: Secondary | ICD-10-CM

## 2019-02-13 DIAGNOSIS — Z794 Long term (current) use of insulin: Secondary | ICD-10-CM | POA: Diagnosis not present

## 2019-02-13 DIAGNOSIS — E1140 Type 2 diabetes mellitus with diabetic neuropathy, unspecified: Secondary | ICD-10-CM

## 2019-02-13 MED ORDER — INSULIN ASPART 100 UNIT/ML FLEXPEN: 18.0000 [IU] | PEN_INJECTOR | Freq: Three times a day (TID) | SUBCUTANEOUS | 11 refills | Status: DC

## 2019-02-13 MED ORDER — SOLIQUA 100-33 UNT-MCG/ML ~~LOC~~ SOPN: 40.0000 [IU] | PEN_INJECTOR | SUBCUTANEOUS | 3 refills | Status: DC

## 2019-02-13 MED ORDER — METFORMIN HCL 500 MG PO TABS: 500.0000 mg | ORAL_TABLET | Freq: Two times a day (BID) | ORAL | 2 refills | Status: DC

## 2019-02-13 NOTE — Progress Notes (Signed)
Documentation for Freestyle Libre Pro Continuous glucose monitoring Freestyle Libre Pro CGM sensor placed today. Patient was educated about wearing sensor, keeping food, activity and medication log and when to call office. Patient was educated about how to care for the sensor and not to have an MRI, CT or Diathermy while wearing the sensor. Follow up was arranged with the patient for 1 week.   Lot #: [id number] Serial #: [id number] Expiration Date: 05/11/2019  Coordinated care with Dr. Berline Lopes.   Rewrote AVS for patient as print was hard to read for patient with his vision issues. Went over AVS and medications with patient again to to ensure patient understood medication regiment prescribed by Dr. Berline Lopes. Patient was able to read medication regiment and understood the changes made. Also ensured patient understood too take novolog before meals and check blood sugars ~2 hours later.   Debera Lat, RD 02/13/2019 10:45 AM.

## 2019-02-13 NOTE — Progress Notes (Signed)
Internal Medicine Clinic Attending  Case discussed with Dr. Harbrecht at the time of the visit.  We reviewed the resident's history and exam and pertinent patient test results.  I agree with the assessment, diagnosis, and plan of care documented in the resident's note.   

## 2019-02-13 NOTE — Patient Instructions (Addendum)
FOLLOW-UP INSTRUCTIONS When: One week For: The device check for your diabetes What to bring: All of your medications  I have made several changes to your medications today.   Today we discussed your diabetes. Until you pick up your new scripts please continue to take what you have. When you get your new medicine please start taking the Soliqua once daily at 40 units per day. Take the Novolog at 14-18 units per day and the metformin 500mg  two times per day. Please keep checking your blood sugar levels and let us know if they rise or fall rapidly.   Willeen Niece is long long acting insulin. Continue the lantus until you have the [facility] but stop the lantus when you start the [facility] in the morning.  The Novolog is to replace the Humalog when you get this. Keep taking the Humalog until you get Novolog, if insurance will approve it.  Start the Metformin as soon as you get it.  Thank you for your visit to the [facility] today. If you have any questions or concerns please call us at [phone].

## 2019-02-13 NOTE — Assessment & Plan Note (Signed)
  DMII: Hgb A1c 9.3 % at the last visit two weeks ago which was down from 10.8% prior.  The patient denied polyuria, polydipsia, headache, fatigue, confusion, nausea, vomiting or diaphoresis.  The patient has been on several medications in the past including an SGLT2 which he has a contraindication to now.  He was on Metformin for a while but this was stopped for an unspecified reason.  He states that he was recently visiting a friend out of state and ran out of his Humalog.  He was given NovoLog which he feels works much better.  He has tried Humalog several times in the past but little success.  I am uncertain as to why there would be a difference between the 2 but I do agree that his numbers on NovoLog are much better than or Humalog.  As such I will attempt to assist him with obtaining NovoLog at this time. Additionally, given his CAD history of CVA history we will attempt treatment with a GLP-1.  We will attempt to obtain [facility] and stop his Lantus. We will begin metformin therapy. He will start the CGM project today and return in 1 week.  Plan: A1c next visit Stop Humalog if he is able to obtain NovoLog start 14 to 18 units daily Stop Lantus if he is able to obtain Soliqua start 40 units daily Start Metformin Begin CGM monitoring today He associated the nutritionist today as well

## 2019-02-13 NOTE — Patient Instructions (Signed)
Please record the time, amount and what food drinks and activities you have while wearing the continuous glucose monitor (CGM).  Bring the folder with you to follow up appointments. If your monitor falls off, please place it in the bag provided in your folder and bring it back with you to your next appointment.   Do not have a CT or an MRI while wearing the CGM.   1 week visit has been set up with me and a doctor for the first of two CGM downloads.   You will also return in 2 weeks to have your second download and the CGM removed.  Debera Lat, RD 02/13/2019 10:19 AM

## 2019-02-13 NOTE — Progress Notes (Signed)
   CC: diabetes  HPI:Mark Cabrera is a 68 y.o. male who presents for evaluation of diabetes. Please see individual problem based A/P for details.   Past Medical History:  Diagnosis Date  . Acute diastolic congestive heart failure ([place])   . Aortic valve stenosis s/p AVR 2018  . Atrial fibrillation ([place]) - post-op CABG    04/2016 CHA2DS2VAS score = 5  . Atypical nevi   . Coronary artery disease s/p 2 vessel CABG   . Depression    "years ago"  . Diabetes mellitus   . Dyspnea    in the past   . GERD (gastroesophageal reflux disease)   . Heart murmur   . Hepatic cirrhosis ([place]) 06/29/2017  . History of kidney stones   . Hyperlipidemia    hx of transaminitis secondary to statin and he has decided not to use statins secondary to potential side effects.  . Hypertension   . Nephrolithiasis   . Osteoarthritis, knee   . Sleep apnea     Central apnea. Not using cpap  . Stroke [facility]) 2007  . Transaminitis     Statin-induced  . 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 2-D echo was normal no embolic source found.   Review of Systems:  ROS negative except as per HPI.  Physical Exam: Vitals:   02/13/19 0904  BP: 126/68  Pulse: 72  Temp: 98 F (36.7 C)  TempSrc: Oral  SpO2: 100%  Weight: 211 lb 6.4 oz (95.9 kg)  Height: 5\' 7"  (1.702 m)   General: A/O x4, in no acute distress, afebrile, nondiaphoretic HEENT: PEERL, EMO intact Cardio: RRR, no mrg's  Pulmonary: CTA bilaterally, no wheezing or crackles  Abdomen: Bowel sounds normal, soft, nontender  MSK: BLE nontender, nonedematous Psych: Appropriate affect, not depressed in appearance, engages well  Assessment & Plan:   See Encounters Tab for  problem based charting.  Patient discussed with Dr. Daryll Drown

## 2019-02-19 ENCOUNTER — Telehealth: Payer: Self-pay | Admitting: *Deleted

## 2019-02-19 NOTE — Telephone Encounter (Addendum)
Information wassent through CoverMyMeds for PA for Novolog Flex Pen.  Awaiting determination within 72 hours.  Sander Nephew, RN 02/19/2019 11:20 AM. Virgel Gess approval for Novolog Flex Pen 02/19/2019 thru 03/12/2020.  Sander Nephew, RN 02/19/2019 3:37 PM.

## 2019-02-20 ENCOUNTER — Ambulatory Visit: Payer: Medicare Other | Admitting: Dietician

## 2019-02-20 ENCOUNTER — Encounter (HOSPITAL_COMMUNITY): Payer: Self-pay

## 2019-02-20 ENCOUNTER — Other Ambulatory Visit: Payer: Self-pay

## 2019-02-20 ENCOUNTER — Encounter: Payer: Self-pay | Admitting: Dietician

## 2019-02-20 ENCOUNTER — Emergency Department (HOSPITAL_COMMUNITY)
Admission: EM | Admit: 2019-02-20 | Discharge: 2019-02-20 | Disposition: A | Discharge status: Home / Self Care | Payer: Medicare Other | Attending: Emergency Medicine | Admitting: Emergency Medicine

## 2019-02-20 ENCOUNTER — Emergency Department (HOSPITAL_COMMUNITY): Payer: Medicare Other

## 2019-02-20 ENCOUNTER — Telehealth: Payer: Self-pay | Admitting: *Deleted

## 2019-02-20 DIAGNOSIS — E119 Type 2 diabetes mellitus without complications: Secondary | ICD-10-CM | POA: Diagnosis not present

## 2019-02-20 DIAGNOSIS — Z951 Presence of aortocoronary bypass graft: Secondary | ICD-10-CM | POA: Insufficient documentation

## 2019-02-20 DIAGNOSIS — R200 Anesthesia of skin: Secondary | ICD-10-CM | POA: Diagnosis not present

## 2019-02-20 DIAGNOSIS — Z79899 Other long term (current) drug therapy: Secondary | ICD-10-CM | POA: Diagnosis not present

## 2019-02-20 DIAGNOSIS — Z8673 Personal history of transient ischemic attack (TIA), and cerebral infarction without residual deficits: Secondary | ICD-10-CM | POA: Insufficient documentation

## 2019-02-20 DIAGNOSIS — R0602 Shortness of breath: Secondary | ICD-10-CM | POA: Diagnosis not present

## 2019-02-20 DIAGNOSIS — Z794 Long term (current) use of insulin: Secondary | ICD-10-CM | POA: Diagnosis not present

## 2019-02-20 DIAGNOSIS — Z7901 Long term (current) use of anticoagulants: Secondary | ICD-10-CM | POA: Diagnosis not present

## 2019-02-20 DIAGNOSIS — R42 Dizziness and giddiness: Secondary | ICD-10-CM | POA: Insufficient documentation

## 2019-02-20 DIAGNOSIS — R079 Chest pain, unspecified: Secondary | ICD-10-CM | POA: Insufficient documentation

## 2019-02-20 LAB — TROPONIN I (HIGH SENSITIVITY)
Troponin I (High Sensitivity): 30 ng/L — ABNORMAL HIGH (ref <18)
Troponin I (High Sensitivity): 28 ng/L — ABNORMAL HIGH (ref <18)

## 2019-02-20 LAB — BASIC METABOLIC PANEL
Anion gap: 13 (ref 5–15)
BUN: 18 mg/dL (ref 8–23)
CO2: 27 mmol/L (ref 22–32)
Calcium: 9.6 mg/dL (ref 8.9–10.3)
Chloride: 97 mmol/L — ABNORMAL LOW (ref 98–111)
Creatinine, Ser: 1.16 mg/dL (ref 0.61–1.24)
GFR calc Af Amer: >60 mL/min (ref >60)
GFR calc non Af Amer: >60 mL/min (ref >60)
Glucose, Bld: 284 mg/dL — ABNORMAL HIGH (ref 70–99)
Potassium: 4 mmol/L (ref 3.5–5.1)
Sodium: 137 mmol/L (ref 135–145)

## 2019-02-20 LAB — CBC
HCT: 37.3 % — ABNORMAL LOW (ref 39.0–52.0)
Hemoglobin: 12.4 g/dL — ABNORMAL LOW (ref 13.0–17.0)
MCH: 29.3 pg (ref 26.0–34.0)
MCHC: 33.2 g/dL (ref 30.0–36.0)
MCV: 88.2 fL (ref 80.0–100.0)
Platelets: 211 10*3/uL (ref 150–400)
RBC: 4.23 MIL/uL (ref 4.22–5.81)
RDW: 13.4 % (ref 11.5–15.5)
WBC: 3.8 10*3/uL — ABNORMAL LOW (ref 4.0–10.5)
nRBC: 0 % (ref 0.0–0.2)

## 2019-02-20 MED ORDER — ASPIRIN 81 MG PO CHEW
324.0000 mg | CHEWABLE_TABLET | Freq: Once | ORAL | Status: AC
  Administered 2019-02-20: 324 mg via ORAL
  Filled 2019-02-20: qty 4

## 2019-02-20 MED ORDER — ISOSORBIDE MONONITRATE ER 30 MG PO TB24: 30.0000 mg | ORAL_TABLET | Freq: Every day | ORAL | 1 refills | Status: DC

## 2019-02-20 MED ORDER — ISOSORBIDE MONONITRATE ER 30 MG PO TB24
15.0000 mg | ORAL_TABLET | Freq: Every day | ORAL | Status: DC
  Administered 2019-02-20: 15 mg via ORAL
  Filled 2019-02-20: qty 1

## 2019-02-20 NOTE — ED Triage Notes (Signed)
Pt brought from the internal medicine clinic where he had a normal DM check up. Upon further assessment the pt had episode of chest tightness last night and left arm numbness with shob. Axox4.

## 2019-02-20 NOTE — ED Notes (Signed)
Pt is NSR on monitor 

## 2019-02-20 NOTE — ED Provider Notes (Signed)
[place] EMERGENCY DEPARTMENT Provider Note   CSN: [id number] Arrival date & time: 02/20/19  [id number]     History Chief Complaint  Patient presents with  . Chest Pain    Mark Cabrera is a 68 y.o. male with a history of CHF, CAD s/p 2 vessel CABG & 3 stents by PCI, afib on eliquis, DM, HTN, hyperlipidemia, sleep apnea, prior vertebral artery dissection, & hepatic cirrhosis who presents to the emergency department from his PCP office with complaints of chest discomfort which occurred last night and is resolved at present.  Patient states that while he was trying to go to sleep last night he developed some shortness of breath with associated chest tightness/pressure, symptoms lasted 2 to 3 hours, he took an aspirin, he was eventually able to go to sleep and woke up relatively asymptomatic.  No specific alleviating or aggravating factors for his symptoms which he can recall.  He has not had recurrence of discomfort/dyspnea today.  He does state that he had similar symptoms a few nights in a row 6 weeks prior when he was in [place] visiting a friend, he ultimately went to the emergency department there and was admitted, underwent cardiac catheterization with 3 stents placed, he believes that there were greater than 90% blockages.  Triage note mentions some left upper extremity numbness, he states this is baseline status post prior CVA.  He also has baseline intermittent dizziness related to prior CVA as well.  He denies nausea, vomiting, diaphoresis, syncope, fever, cough, or leg pain/swelling.  HPI     Past Medical History:  Diagnosis Date  . Acute diastolic congestive heart failure ([place])   . Aortic valve stenosis s/p AVR 2018  . Atrial fibrillation ([place]) - post-op CABG    04/2016 CHA2DS2VAS score = 5  . Atypical nevi   . Coronary artery disease s/p 2 vessel CABG   . Depression    "years ago"  . Diabetes mellitus   . Dyspnea    in the past   . GERD (gastroesophageal  reflux disease)   . Heart murmur   . Hepatic cirrhosis ([place]) 06/29/2017  . History of kidney stones   . Hyperlipidemia    hx of transaminitis secondary to statin and he has decided not to use statins secondary to potential side effects.  . Hypertension   . Nephrolithiasis   . Osteoarthritis, knee   . Sleep apnea     Central apnea. Not using cpap  . Stroke [facility]) 2007  . Transaminitis     Statin-induced  . 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 2-D echo was normal no embolic source found.    Patient Active Problem List   Diagnosis Date Noted  . Depression 11/05/2018  . Blurry vision, bilateral 09/20/2018  . Rotator cuff syndrome, left   . 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 CVA (cerebral vascular accident) ([place]) 06/28/2018  . Hepatic cirrhosis ([place]) 06/29/2017  . Candida albicans infection 01/09/2017  . Healthcare maintenance 12/16/2016  . Lipoma of left upper extremity 09/12/2016  . Chronic knee pain 07/04/2016  . Decreased visual acuity 07/04/2016  . Peripheral neuropathic pain 05/12/2016  . Pericardial effusion a. subxiphoid pericardial window on 04/21/2016 04/28/2016  . Pleural  effusion on left, s/p throacentesis 04/18/16 04/28/2016  . Paroxysmal atrial fibrillation ([place]) 04/16/2016  . 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  . CAD (coronary artery disease), native coronary artery   . Acute diastolic CHF (congestive heart failure) ([place])   . Sleep apnea 08/02/2009  . 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 Dyslipidemia 05/18/2006  . Hypertensive heart disease   . History of dissection of vertebral artery   ([place]) 08/02/2005    Past Surgical History:  Procedure Laterality Date  . AORTIC VALVE REPLACEMENT N/A 03/30/2016   Procedure: AORTIC VALVE REPLACEMENT (AVR);  Surgeon: [name], MD;  Location: [place];  Service: Open Heart Surgery;  Laterality: N/A;  . CARDIAC CATHETERIZATION N/A 03/15/2016   Procedure: Right/Left Heart Cath and Coronary Angiography;  Surgeon: Burnell Blanks, MD;  Location: [place] [facility];  Service: Cardiovascular;  Laterality: N/A;  . CLIPPING OF ATRIAL APPENDAGE N/A 03/30/2016   Procedure: CLIPPING OF LEFT ATRIAL APPENDAGE;  Surgeon: [name], MD;  Location: [place];  Service: Open Heart Surgery;  Laterality: N/A;  . CORONARY ARTERY BYPASS GRAFT N/A 03/30/2016   Procedure: CORONARY ARTERY BYPASS GRAFTING (CABG) Times Two;  Surgeon: [name], MD;  Location: [place];  Service: Open Heart Surgery;  Laterality: N/A;  . KNEE ARTHROSCOPY W/ PARTIAL MEDIAL MENISCECTOMY  05/12/2005   right, performed by Dr. French Ana for torn medial meniscus.  Marland Kitchen ROTATOR CUFF REPAIR Right 2016  . STERNAL WIRES REMOVAL N/A 08/13/2017   Procedure: STERNAL WIRES REMOVAL;  Surgeon: [name], MD;  Location: [place];  Service: Thoracic;  Laterality: N/A;  . SUBXYPHOID PERICARDIAL WINDOW N/A 04/21/2016   Procedure: SUBXYPHOID PERICARDIAL WINDOW;  Surgeon: [name], MD;  Location: [place];  Service: Thoracic;  Laterality: N/A;  . TEE WITHOUT CARDIOVERSION N/A 03/30/2016   Procedure: TRANSESOPHAGEAL ECHOCARDIOGRAM (TEE);  Surgeon: [name], MD;  Location: [place];  Service: Open Heart Surgery;  Laterality: N/A;  . TEE WITHOUT CARDIOVERSION N/A 04/21/2016   Procedure: TRANSESOPHAGEAL ECHOCARDIOGRAM (TEE);  Surgeon: [name], MD;  Location: [facility] OR;  Service: Thoracic;  Laterality: N/A;       Family History  Problem Relation Age of Onset  . Hypertension Mother   . Diabetes Mother   . Alcohol abuse Father     Social History    Tobacco Use  . Smoking status: Never Smoker  . Smokeless tobacco: Never Used  Substance Use Topics  . Alcohol use: Yes    Alcohol/week: 1.0 standard drinks    Types: 1 Cans of beer per week    Comment: occasional  . Drug use: No    Home Medications Prior to Admission medications   Medication Sig Start Date End Date Taking? Authorizing Provider  acetaminophen (TYLENOL) 325 MG tablet Take 2 tablets (650 mg total) by mouth every 4 (four) hours as needed for headache or mild pain. 04/28/16   Isaiah Serge, NP  atorvastatin (LIPITOR) 80 MG tablet Take 1 tablet (80 mg total) by mouth daily at 6 PM. 07/09/18   Angiulli, Lavon Paganini, PA-C  BAYER MICROLET LANCETS lancets Check blood sugar 3 times a day as instructed 06/28/17   Kalman Shan Ratliff, DO  clopidogrel (PLAVIX) 75 MG tablet Take 75 mg by mouth daily.    [provider]  ELIQUIS 5 MG TABS tablet Take 1 tablet by mouth twice daily 10/15/18   Evette Doffing,  Mallie Mussel, MD  Evolocumab 140 MG/ML SOAJ Inject into the skin. Has not started medication yet    [provider]  ezetimibe (ZETIA) 10 MG tablet Take 10 mg by mouth at bedtime. 01/15/19   [provider]  furosemide (LASIX) 40 MG tablet Take 1 tablet by mouth daily. 12/29/18   [provider]  gabapentin (NEURONTIN) 100 MG capsule Take 1 capsule (100 mg total) by mouth 3 (three) times daily. 07/09/18   Angiulli, Lavon Paganini, PA-C  glucose blood (CONTOUR NEXT TEST) test strip Check blood sugar 3 times a day as instructed 06/17/18   Kalman Shan Ratliff, DO  hydrochlorothiazide (HYDRODIURIL) 12.5 MG tablet Take 2 tablets (25 mg total) by mouth daily. 09/20/18   Seawell, Jaimie A, DO  insulin aspart (NOVOLOG) 100 UNIT/ML FlexPen Inject 18 Units into the skin 3 (three) times daily with meals. 02/13/19   Kathi Ludwig, MD  Insulin Glargine-Lixisenatide (SOLIQUA) 100-33 UNT-MCG/ML SOPN Inject 40 Units into the skin every morning. 02/13/19   Kathi Ludwig,  MD  Insulin Pen Needle 31G X 5 MM MISC Use one pen needle three times daily. Dx E11.49 11/26/18   [name], MD  lisinopril (ZESTRIL) 10 MG tablet Take 10 mg by mouth daily. 01/15/19   [provider]  metFORMIN (GLUCOPHAGE) 500 MG tablet Take 1 tablet (500 mg total) by mouth 2 (two) times daily with a meal. 02/13/19   Kathi Ludwig, MD  neomycin-polymyxin-hydrocortisone (CORTISPORIN) 3.5-10000-1 OTIC suspension Place 3 drops into the left ear 4 (four) times daily. 08/29/18   Wieters, Hallie C, PA-C  nitroGLYCERIN (NITROSTAT) 0.4 MG SL tablet PLACE 1 TABLET UNDER THE TONGUE EVERY 5 MINUTES AS NEEDED FOR CHEST PAIN 11/25/18   Isaiah Serge, NP    Allergies    Penicillins, Statins, Morphine and related, and Sglt2 inhibitors  Review of Systems   Review of Systems  Constitutional: Negative for chills, diaphoresis and fever.  HENT: Negative for congestion, ear pain and sore throat.   Respiratory: Positive for shortness of breath (Resolved at present.). Negative for cough.   Cardiovascular: Positive for chest pain (Resolved at present).  Gastrointestinal: Negative for abdominal pain, nausea and vomiting.  Neurological: Negative for syncope and headaches.       Positive for dizziness and left upper extremity paresthesias which are baseline status post CVA, no acute change.  All other systems reviewed and are negative.   Physical Exam Updated Vital Signs BP 107/70 (BP Location: Right Arm)   Pulse (!) 59   Temp 97.7 F (36.5 C) (Oral)   Resp 16   SpO2 99%   Physical Exam Vitals and nursing note reviewed.  Constitutional:      General: He is not in acute distress.    Appearance: He is well-developed. He is not toxic-appearing.  HENT:     Head: Normocephalic and atraumatic.  Eyes:     General:        Right eye: No discharge.        Left eye: No discharge.     Conjunctiva/sclera: Conjunctivae normal.  Cardiovascular:     Rate and Rhythm: Normal rate and regular  rhythm.     Pulses:          Radial pulses are 2+ on the right side and 2+ on the left side.  Pulmonary:     Effort: Pulmonary effort is normal. No respiratory distress.     Breath sounds: Normal breath sounds. No wheezing, rhonchi or rales.  Abdominal:  General: There is no distension.     Palpations: Abdomen is soft.     Tenderness: There is no abdominal tenderness.  Musculoskeletal:     Cervical back: Neck supple.     Right lower leg: No edema.     Left lower leg: No edema.  Skin:    General: Skin is warm and dry.     Findings: No rash.  Neurological:     Mental Status: He is alert.     Comments: Clear speech.   Psychiatric:        Behavior: Behavior normal.    ED Results / Procedures / Treatments   Labs (all labs ordered are listed, but only abnormal results are displayed) Labs Reviewed  BASIC METABOLIC PANEL - Abnormal; Notable for the following components:      Result Value   Chloride 97 (*)    Glucose, Bld 284 (*)    All other components within normal limits  CBC - Abnormal; Notable for the following components:   WBC 3.8 (*)    Hemoglobin 12.4 (*)    HCT 37.3 (*)    All other components within normal limits  TROPONIN I (HIGH SENSITIVITY) - Abnormal; Notable for the following components:   Troponin I (High Sensitivity) 30 (*)    All other components within normal limits  TROPONIN I (HIGH SENSITIVITY)    EKG EKG Interpretation  Date/Time:  Thursday February 20 2019 09:54:01 EST Ventricular Rate:  69 PR Interval:  194 QRS Duration: 116 QT Interval:  436 QTC Calculation: 467 R Axis:   -35 Text Interpretation: Normal sinus rhythm Left axis deviation Left bundle branch block Confirmed by Virgel Manifold [phone]) on 02/20/2019 10:50:30 AM   Radiology DG Chest 2 View  Result Date: 02/20/2019 CLINICAL DATA:  Chest pain, shortness of breath, weakness EXAM: CHEST - 2 VIEW COMPARISON:  08/07/2017 FINDINGS: Mild cardiomegaly. Status post aortic valve  replacement, CABG, and left atrial appendage clipping. Both lungs are clear. The visualized skeletal structures are unremarkable. IMPRESSION: 1.  Cardiomegaly without acute abnormality of the lungs. 2. Status post aortic valve replacement, CABG, and left atrial appendage clipping. Electronically Signed   By: Eddie Candle M.D.   On: 02/20/2019 10:57    Procedures Procedures (including critical care time)  Medications Ordered in ED Medications  aspirin chewable tablet 324 mg (has no administration in time range)    ED Course  I have reviewed the triage vital signs and the nursing notes.  Pertinent labs & imaging results that were available during my care of the patient were reviewed by me and considered in my medical decision making (see chart for details).  Patient presents to the emergency department from his PCP office for evaluation of shortness of breath and chest discomfort which occurred last night and is resolved at present.  Patient is nontoxic-appearing, resting comfortably, vitals with intermittent mild bradycardia, otherwise WNL.  Benign physical exam. DDX: ACS, CHF, PE, Dissection, pneumonia, pneumothorax, critical anemia.   CBC: Mild leukopenia & anemia which is consistent with prior.  BMP: Hyperglycemia without acidosis/anion gap elevation. No significant electrolyte derangement.  Initial troponin: Mild elevation @ 30.  EKG NSR, LBBB, no significant change from prior- reviewed with Dr. Wilson Singer.  CXR: 1.  Cardiomegaly without acute abnormality of the lungs. 2. Status post aortic valve replacement, CABG, and left atrial appendage clipping.  Given patient reporting similar sxs prior to recent cardiac catheterization requiring 3 stent placements will discuss with cardiology on call.   11:38:  CONSULT: Discussed with Fabian Sharp PA-C with cardiology, requesting cath records from prior hospital if possible, cardiology team will see patient in consultation.   Downtrending repeat  troponin 30--> 28  15:15: Received hardcopy of cardiac catheterization report. Still attempting to push cath to PACS Hard copy reviewed- Conclusion: Successful PCI for a TCO of the mid LAD with drug-eluting stent and excellent results with TIMI-3 flow.  Estimated SYNTAX score is stated was approximately 25.  Successful PCI 1 to a TCO to the LAD and 2 to the saphenous vein graft to the RCA.  Updated cardiology team in regards to receiving hard copy of catheterization report- no current recommendations, remains pending cardiology evaluation.   15:20: Discussed with Sharee Pimple- NP with cardiology who has evaluated the patient, she is planning to discuss with Dr. Debara Pickett and relay recommendations.   15:50: Patient care signed out to Dr. Baltazar Najjar at change of shift pending cardiology recommendations and disposition.  Findings and plan of care discussed with supervising physician Dr. Wilson Singer who is in agreement.    Final Clinical Impression(s) / ED Diagnoses Final diagnoses:  Chest pain, unspecified type    Rx / DC Orders ED Discharge Orders    None       Amaryllis Dyke, PA-C 02/20/19 1550    Virgel Manifold, MD 02/21/19 [phone]

## 2019-02-20 NOTE — Telephone Encounter (Signed)
While patient was in office meeting with Dietician, patient's sister, Lieutenant Diego, called to report patient had episode at 3 AM today. States she found him sitting in a chair in the living room. States he c/o chest tightness while lying down that was better while sitting. He was also c/o left arm numbness, his smile was "off", he was very pale, stumbling around more than usual, and "out of it." His CBG at the time was 129. Patient took an aspirin as he was worried he was having a heart attack. Dietician stated patient's last at home glucose per glucometer was 180 after which patient took 20 units Humalog and ate only a few crackers. After speaking with Attending patient was transported via w/c to ED. Report given to Lennette Bihari, RN while NT was taking his vitals. Hubbard Hartshorn, BSN, RN-BC

## 2019-02-20 NOTE — Progress Notes (Signed)
Documentation for [employer] Pro Continuous glucose monitoring follow up Fre[employer] Pro CGM sensor was removed today. It only recorded 2 days of data. It was not replaced today, but can be at a future date as it will be replaced by the company. [name] states he did not check his blood sugar for few days because he had the CGM on. He  reports he was not sure what the [place] replaced so he has not started it. He reports taking lantus (twice a day) last dose was last night and Humalog 20 units this  morning before breakfast and also taking metformin and he thinks this is helping his blood sugars.  Hi blood sugar per his meter was 127(252AM) and this am was 180mg /dl(831am).   Follow up as needed to assist with diabetes self care.   Debera Lat, RD 02/20/2019 11:59 AM.

## 2019-02-20 NOTE — ED Provider Notes (Signed)
Care of patient assumed from [facility] at 3:18 PM.  Agree with history, physical exam and plan.  See their note for further details.  Briefly, 68 y.o. male with PMH/PSH as below who presents with chest pain.  Hx of CAD s/p CABG and PCI; recent cath in [place] Afib on eliquis  Chest pain last night, currently CP free, sent from clinic  Troponin 30 -> 28  Past Medical History:  Diagnosis Date  . Acute diastolic congestive heart failure ([place])   . Aortic valve stenosis s/p AVR 2018  . Atrial fibrillation ([place]) - post-op CABG    04/2016 CHA2DS2VAS score = 5  . Atypical nevi   . Coronary artery disease s/p 2 vessel CABG   . Depression    "years ago"  . Diabetes mellitus   . Dyspnea    in the past   . GERD (gastroesophageal reflux disease)   . Heart murmur   . Hepatic cirrhosis ([place]) 06/29/2017  . History of kidney stones   . Hyperlipidemia    hx of transaminitis secondary to statin and he has decided not to use statins secondary to potential side effects.  . Hypertension   . Nephrolithiasis   . Osteoarthritis, knee   . Sleep apnea     Central apnea. Not using cpap  . Stroke [facility]) 2007  . Transaminitis     Statin-induced  . 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 2-D echo was normal no embolic source found.   Past Surgical History:  Procedure Laterality Date  . AORTIC VALVE REPLACEMENT N/A 03/30/2016   Procedure: AORTIC VALVE REPLACEMENT (AVR);  Surgeon: [name], MD;  Location: [place];  Service: Open Heart Surgery;  Laterality: N/A;  . CARDIAC CATHETERIZATION N/A 03/15/2016   Procedure: Right/Left Heart Cath and Coronary Angiography;  Surgeon: Burnell Blanks, MD;  Location: [place] CV [facility];  Service: Cardiovascular;  Laterality: N/A;  . CLIPPING OF ATRIAL APPENDAGE N/A 03/30/2016   Procedure: CLIPPING OF LEFT ATRIAL APPENDAGE;  Surgeon: [name], MD;  Location: [place];  Service: Open Heart Surgery;  Laterality: N/A;  . CORONARY ARTERY BYPASS GRAFT N/A 03/30/2016   Procedure: CORONARY ARTERY BYPASS GRAFTING (CABG) Times Two;  Surgeon: [name], MD;  Location: [place];  Service: Open Heart Surgery;  Laterality: N/A;  . KNEE ARTHROSCOPY W/ PARTIAL MEDIAL MENISCECTOMY  05/12/2005   right, performed by Dr. French Ana for torn medial meniscus.  Marland Kitchen ROTATOR CUFF REPAIR Right 2016  . STERNAL WIRES REMOVAL N/A 08/13/2017   Procedure: STERNAL WIRES REMOVAL;  Surgeon: [name], MD;  Location: [place];  Service: Thoracic;  Laterality: N/A;  . SUBXYPHOID PERICARDIAL WINDOW N/A 04/21/2016   Procedure: SUBXYPHOID PERICARDIAL WINDOW;  Surgeon: [name], MD;  Location: [place];  Service: Thoracic;  Laterality: N/A;  . TEE WITHOUT CARDIOVERSION N/A 03/30/2016   Procedure: TRANSESOPHAGEAL ECHOCARDIOGRAM (TEE);  Surgeon: [name], MD;  Location: [place];  Service: Open Heart Surgery;  Laterality: N/A;  . TEE WITHOUT CARDIOVERSION N/A 04/21/2016   Procedure: TRANSESOPHAGEAL ECHOCARDIOGRAM (TEE);  Surgeon: [name], MD;  Location: [place];  Service: Thoracic;  Laterality: N/A;      Current Plan: Cardiology consulted, awaiting recommendations  MDM/ED Course: Per cardiology, they have seen and evaluated the patient at bedside.  Patient currently chest pain-free.  They have arranged outpatient follow-up on December 28 and recommend initiating 15 mg daily Imdur.  ESR was initially ordered, however, they feel this is not consistent with pericarditis/Dressler syndrome at this time and okay to cancel.  Upon assessment, patient resting in bed in no acute distress.  We discussed outpatient plan follow-up and endorses understanding.  Stable  for discharge at this time.  Should return precautions given.   Consults: Cardiology    Significant labs/images:  I personally reviewed and interpreted all labs.  The plan for this patient was discussed with Dr. Ronnald Nian, who voiced agreement and who oversaw evaluation and treatment of this patient.    Burns [place], MD 02/20/19 [place], [place], DO 02/20/19 [phone]

## 2019-02-20 NOTE — Telephone Encounter (Signed)
I agree. Thank you.

## 2019-02-20 NOTE — Discharge Instructions (Addendum)
You were seen in the emergency department today for chest pain. Your work-up in the emergency department has been overall reassuring. Your labs have been fairly normal and or similar to previous blood work you have had done. Your EKG and the enzyme we use to check your heart did not show an acute heart attack at this time. Your chest x-ray did not show any substantial new abnormalities.  You are evaluated by the cardiology team in the emergency department, with they have recommended we discharge you home to follow-up in clinic and also to follow-up for possible sleep study to evaluate for sleep apnea.  We have given you pulmonology information to help set this up.   We would like you to follow up closely with your primary care provider and/or the cardiologist provided in your discharge instructions within 1-3 days. Return to the ER immediately should you experience any new or worsening symptoms including but not limited to return of pain, worsened pain, vomiting, shortness of breath, dizziness, lightheadedness, passing out, or any other concerns that you may have.

## 2019-02-20 NOTE — Telephone Encounter (Signed)
Sister notified that patient is currently in ED. She is very Patent attorney. Hubbard Hartshorn, BSN, RN-BC

## 2019-02-20 NOTE — Consult Note (Signed)
Cardiology Consultation:   Patient ID: Mark Cabrera; [id number]; 06-19-1950   Admit date: 02/20/2019 Date of Consult: 02/20/2019  Primary Care Provider: Ladona Horns, MD Primary Cardiologist: Dr. [name], MD  Patient Profile:   Mark Cabrera is a 68 y.o. male with a hx of HTN, HLD, atrial fibrillation, DM, severe aortic stenosis now s/p AVR 03/2016, CAD s/p 2V CABG 03/2016 and prior CVA who is being seen today for the evaluation of chest pain from his PCP office.   History of Present Illness:   [name] is a 68yo M with a hx as stated above who presented to [facility] from his PCP on 02/20/2019 with complaints of chest pain with associated SOB which occurred last night while attempting to sleep.  He reports that the pain resolved on its own however lasted approximately 2-3 hours. He took an ASA and was eventually able to go to sleep and woke without recurrent symptoms. He does report he had similar symptoms prior to his cardiac stenting back in 12/2018 at which time his symptoms occurred while laying flat and at night only and would subside upon wakening. He found relief with sitting up in the upright position, similar to his relief last night.  He proceeded to go to his regularly scheduled appointment today at which time his sister called the office to inform them of his symptoms overnight.  Given this, they recommended an ED evaluation.  In the ED, HsT found to be stable at 30>28 no consistent with ACS. EKG without ischemia however there appears to be mild T changes in leads V4-V5. CXR with cardiomegaly without acute abnormality.  He is currently chest pain-free without recurrent symptoms.  Denies LE swelling, weight gain, recent viral infection, fevers, chills, dizziness, palpitations or presyncopal or syncopal episodes.   He was last seen in the OP setting by Dr. [name] 01/20/2019 for CAD follow up. He was initially admitted 03/2016 and underwent surgical AVR for AS and 2V CABG (SVG to PDA and  SVG to OM1) along with left atrial appendage clipping per Dr. Roxan Hockey. He had post-op atrial fib and volume overload and was also complicated by a large pericardial effusion as well as a pleural effusion. He was readmitted and had thoracentesis and pericardial window. He was started on Lasix. Echo in 06/2018 with LVEF=50-55%, severe LVH, AV bioprosthetic valve working well. The left atrium was severely dilated.He was noted to have had ongoing chest pain since surgery. Sternal wire removal 08/2017. He then had a stroke in 06/2018 felt to be due to an embolic event. He was started on Eliquis. He was admitted to [facility] with chest pain 01/10/19 in which cardiac cath showed occlusion of the SVG to the OM, severe stenosis in the body of the SVG to the PDA and CTO of the LAD. A drug eluting stent was placed in the mid LAD (.275 x 32 mm Synergy DES) and a drug eluting stent was placed in the body of the SVG to the PDA (3.5 x 20 mm Synergy DES).  (Dr. Sindy Guadeloupe).  He was discharged on ASA and Plavix. He was hoping to have an eye procedure while in [place] last week but his cardiac event has postponed this.   Past Medical History:  Diagnosis Date  . Acute diastolic congestive heart failure ([place])   . Aortic valve stenosis s/p AVR 2018  . Atrial fibrillation ([place]) - post-op CABG    04/2016 CHA2DS2VAS score = 5  . Atypical nevi   .  Coronary artery disease s/p 2 vessel CABG   . Depression    "years ago"  . Diabetes mellitus   . Dyspnea    in the past   . GERD (gastroesophageal reflux disease)   . Heart murmur   . Hepatic cirrhosis ([place]) 06/29/2017  . History of kidney stones   . Hyperlipidemia    hx of transaminitis secondary to statin and he has decided not to use statins secondary to potential side effects.  . Hypertension   . Nephrolithiasis   . Osteoarthritis, knee   . Sleep apnea     Central apnea. Not using cpap  . Stroke [facility]) 2007  . Transaminitis     Statin-induced  .  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 2-D echo was normal no embolic source found.    Past Surgical History:  Procedure Laterality Date  . AORTIC VALVE REPLACEMENT N/A 03/30/2016   Procedure: AORTIC VALVE REPLACEMENT (AVR);  Surgeon: [name], MD;  Location: [place];  Service: Open Heart Surgery;  Laterality: N/A;  . CARDIAC CATHETERIZATION N/A 03/15/2016   Procedure: Right/Left Heart Cath and Coronary Angiography;  Surgeon: Burnell Blanks, MD;  Location: [place] CV [facility];  Service: Cardiovascular;  Laterality: N/A;  . CLIPPING OF ATRIAL APPENDAGE N/A 03/30/2016   Procedure: CLIPPING OF LEFT ATRIAL APPENDAGE;  Surgeon: [name], MD;  Location: [place];  Service: Open Heart Surgery;  Laterality: N/A;  . CORONARY ARTERY BYPASS GRAFT N/A 03/30/2016   Procedure: CORONARY ARTERY BYPASS GRAFTING (CABG) Times Two;  Surgeon: [name], MD;  Location: [place];  Service: Open Heart Surgery;  Laterality: N/A;  . KNEE ARTHROSCOPY W/ PARTIAL MEDIAL MENISCECTOMY  05/12/2005   right, performed by Dr. French Ana for torn medial meniscus.  Marland Kitchen ROTATOR CUFF REPAIR Right 2016  . STERNAL WIRES REMOVAL N/A 08/13/2017   Procedure: STERNAL WIRES REMOVAL;  Surgeon: [name], MD;  Location: [place];  Service: Thoracic;  Laterality: N/A;  . SUBXYPHOID PERICARDIAL WINDOW N/A 04/21/2016   Procedure: SUBXYPHOID PERICARDIAL WINDOW;  Surgeon: [name], MD;  Location: [place];  Service: Thoracic;  Laterality: N/A;  . TEE WITHOUT CARDIOVERSION N/A 03/30/2016   Procedure: TRANSESOPHAGEAL ECHOCARDIOGRAM (TEE);  Surgeon: [name], MD;  Location: [place];  Service: Open Heart Surgery;   Laterality: N/A;  . TEE WITHOUT CARDIOVERSION N/A 04/21/2016   Procedure: TRANSESOPHAGEAL ECHOCARDIOGRAM (TEE);  Surgeon: [name], MD;  Location: [place];  Service: Thoracic;  Laterality: N/A;     Prior to Admission medications   Medication Sig Start Date End Date Taking? Authorizing Provider  acetaminophen (TYLENOL) 325 MG tablet Take 2 tablets (650 mg total) by mouth every 4 (four) hours as needed for headache or mild pain. 04/28/16   Isaiah Serge, NP  atorvastatin (LIPITOR) 80 MG tablet Take 1 tablet (80 mg total) by mouth daily at 6 PM. 07/09/18   Angiulli, Lavon Paganini, PA-C  BAYER MICROLET LANCETS lancets Check blood sugar 3 times a day as instructed 06/28/17   Kalman Shan Ratliff, DO  clopidogrel (PLAVIX) 75 MG tablet Take 75 mg by mouth daily.    [provider]  ELIQUIS 5 MG TABS tablet Take 1 tablet by mouth  twice daily 10/15/18   Axel Filler, MD  Evolocumab 140 MG/ML SOAJ Inject into the skin. Has not started medication yet    [provider]  ezetimibe (ZETIA) 10 MG tablet Take 10 mg by mouth at bedtime. 01/15/19   [provider]  furosemide (LASIX) 40 MG tablet Take 1 tablet by mouth daily. 12/29/18   [provider]  gabapentin (NEURONTIN) 100 MG capsule Take 1 capsule (100 mg total) by mouth 3 (three) times daily. 07/09/18   Angiulli, Lavon Paganini, PA-C  glucose blood (CONTOUR NEXT TEST) test strip Check blood sugar 3 times a day as instructed 06/17/18   Kalman Shan Ratliff, DO  hydrochlorothiazide (HYDRODIURIL) 12.5 MG tablet Take 2 tablets (25 mg total) by mouth daily. 09/20/18   Seawell, Jaimie A, DO  insulin aspart (NOVOLOG) 100 UNIT/ML FlexPen Inject 18 Units into the skin 3 (three) times daily with meals. 02/13/19   Kathi Ludwig, MD  Insulin Glargine-Lixisenatide (SOLIQUA) 100-33 UNT-MCG/ML SOPN Inject 40 Units into the skin every morning. 02/13/19   Kathi Ludwig, MD  Insulin Pen Needle 31G X 5 MM MISC Use one  pen needle three times daily. Dx E11.49 11/26/18   [name], MD  lisinopril (ZESTRIL) 10 MG tablet Take 10 mg by mouth daily. 01/15/19   [provider]  metFORMIN (GLUCOPHAGE) 500 MG tablet Take 1 tablet (500 mg total) by mouth 2 (two) times daily with a meal. 02/13/19   Kathi Ludwig, MD  neomycin-polymyxin-hydrocortisone (CORTISPORIN) 3.5-10000-1 OTIC suspension Place 3 drops into the left ear 4 (four) times daily. 08/29/18   Wieters, Hallie C, PA-C  nitroGLYCERIN (NITROSTAT) 0.4 MG SL tablet PLACE 1 TABLET UNDER THE TONGUE EVERY 5 MINUTES AS NEEDED FOR CHEST PAIN 11/25/18   Isaiah Serge, NP   Inpatient Medications: Scheduled Meds:  Continuous Infusions:  PRN Meds:  Allergies:    Allergies  Allergen Reactions  . Penicillins Other (See Comments)    UNSPECIFIED REACTIONS  Has patient had a PCN reaction causing immediate rash, facial/tongue/throat swelling, SOB or lightheadedness with hypotension: Unk Has patient had a PCN reaction causing severe rash involving mucus membranes or skin necrosis: Unk Has patient had a PCN reaction that required hospitalization: Unk Has patient had a PCN reaction occurring within the last 10 years: No If all of the above answers are "NO", then may proceed with Cephalosporin use.   . Statins Other (See Comments)    Severe rash and back pain  . Morphine And Related Other (See Comments)    hallucinations  . Sglt2 Inhibitors Rash    Candidiasis infection prone    Social History:   Social History   Socioeconomic History  . Marital status: Divorced    Spouse name: Not on file  . Number of children: 1  . Years of education: 2y college  . Highest education level: Not on file  Occupational History  . Occupation:  Medicaid [place]    Employer: UNEMPLOYED    Comment: following stroke in 2007  Tobacco Use  . Smoking status: Never Smoker  . Smokeless tobacco: Never Used  Substance and Sexual Activity  . Alcohol use: Yes      Alcohol/week: 1.0 standard drinks    Types: 1 Cans of beer per week    Comment: occasional  . Drug use: No  . Sexual activity: Not Currently  Other Topics Concern  . Not on file  Social History Narrative   Single but has a girlfriend.    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       Patient has medications disability post stroke.            Social Determinants of Health   Financial Resource Strain: Low Risk   . Difficulty of Paying Living Expenses: Not hard at all  Food Insecurity: No Food Insecurity  . Worried About Charity fundraiser in the Last Year: Never true  . Ran Out of Food in the Last Year: Never true  Transportation Needs: No Transportation Needs  . Lack of Transportation (Medical): No  . Lack of Transportation (Non-Medical): No  Physical Activity: Inactive  . Days of Exercise per Week: 0 days  . Minutes of Exercise per Session: 0 min  Stress: No Stress Concern Present  . Feeling of Stress : Only a little  Social Connections: Unknown  . Frequency of Communication with Friends and Family: More than three times a week  . Frequency of Social Gatherings with Friends and Family: Once a week  . Attends Religious Services: Never  . Active Member of Clubs or Organizations: No  . Attends Archivist Meetings: Never  . Marital Status: Not on file  Intimate Partner Violence:   . Fear of Current or Ex-Partner: Not on file  . Emotionally Abused: Not on file  . Physically Abused: Not on file  . Sexually Abused: Not on file    Family History:   Family History  Problem Relation Age of Onset  . Hypertension Mother   . Diabetes Mother   . Alcohol abuse Father    Family Status:  Family Status  Relation Name Status  . Mother  Deceased  . Father  Deceased  . MGM  Deceased  . MGF  Deceased  . PGM  Deceased  . PGF  Deceased    ROS:  Please see the history of present illness.  All other ROS reviewed and negative.      Physical Exam/Data:   Vitals:   02/20/19 1200 02/20/19 1230 02/20/19 1300 02/20/19 1330  BP: 112/76 107/67 96/64 103/69  Pulse: 63 (!) 58 60 (!) 58  Resp: 15 12 11  (!) 9  Temp:      TempSrc:      SpO2: 100% 100% 98% 100%   No intake or output data in the 24 hours ending 02/20/19 1412 There were no vitals filed for this visit. There is no height or weight on file to calculate BMI.   General: Well developed, well nourished, NAD Neck: Negative for carotid bruits. No JVD Lungs:Clear to ausculation bilaterally. No wheezes. Breathing is unlabored. Cardiovascular: RRR with S1 S2. + murmurs Abdomen: Soft, non-tender, non-distended. No obvious abdominal masses Extremities: No edema. No clubbing or cyanosis. PT pulses 2+ bilaterally Neuro: Alert and oriented. No focal deficits. No facial asymmetry. MAE spontaneously. Psych: Responds to questions appropriately with normal affect.     EKG:  The EKG was personally reviewed and demonstrates:  02/20/2019 NSR with questionable ST depression in V5-V5 HR 68bpm  Telemetry:  Telemetry was personally reviewed and demonstrates: 02/20/2019 NSR with HR 50s to 60s  Relevant CV Studies:  Echo April 2020: 1. The left ventricle has low normal systolic function, with an ejection fraction of 50-55%. The cavity size was mildly dilated. There is severely increased left ventricular wall thickness. Left ventricular diastolic Doppler parameters are consistent  with pseudonormalization. Elevated left ventricular end-diastolic pressure. 2. The right ventricle has normal systolic function. The cavity  was normal. There is no increase in right ventricular wall thickness. 3. Left atrial size was severely dilated. 4. Right atrial size was mildly dilated. 5. The mitral valve is degenerative. Mild thickening of the mitral valve leaflet. Mild calcification of the mitral valve leaflet. 6. The aortic valve was not well visualized. Moderate thickening of the aortic  valve. Moderate calcification of the aortic valve. Mild stenosis of the aortic valve. 7. The interatrial septum was not well visualized.  CATH: See EPIC  Laboratory Data:  Chemistry Recent Labs  Lab 02/20/19 0953  NA 137  K 4.0  CL 97*  CO2 27  GLUCOSE 284*  BUN 18  CREATININE 1.16  CALCIUM 9.6  GFRNONAA >60  GFRAA >60  ANIONGAP 13    Total Protein  Date Value Ref Range Status  07/02/2018 6.1 (L) 6.5 - 8.1 g/dL Final  01/18/2018 7.5 6.0 - 8.5 g/dL Final   Albumin  Date Value Ref Range Status  07/02/2018 3.1 (L) 3.5 - 5.0 g/dL Final  01/18/2018 5.1 (H) 3.6 - 4.8 g/dL Final   AST  Date Value Ref Range Status  07/02/2018 17 15 - 41 U/L Final   ALT  Date Value Ref Range Status  07/02/2018 18 0 - 44 U/L Final   Alkaline Phosphatase  Date Value Ref Range Status  07/02/2018 61 38 - 126 U/L Final   Total Bilirubin  Date Value Ref Range Status  07/02/2018 0.8 0.3 - 1.2 mg/dL Final   Bilirubin Total  Date Value Ref Range Status  01/18/2018 0.6 0.0 - 1.2 mg/dL Final   Hematology Recent Labs  Lab 02/20/19 0953  WBC 3.8*  RBC 4.23  HGB 12.4*  HCT 37.3*  MCV 88.2  MCH 29.3  MCHC 33.2  RDW 13.4  PLT 211   Cardiac EnzymesNo results for input(s): TROPONINI in the last 168 hours. No results for input(s): TROPIPOC in the last 168 hours.  BNPNo results for input(s): BNP, PROBNP in the last 168 hours.  DDimer No results for input(s): DDIMER in the last 168 hours. TSH:  Lab Results  Component Value Date   TSH 2.030 04/16/2016   Lipids: Lab Results  Component Value Date   CHOL 274 (H) 06/29/2018   HDL 45 06/29/2018   LDLCALC 181 (H) 06/29/2018   TRIG 242 (H) 06/29/2018   CHOLHDL 6.1 06/29/2018   HgbA1c: Lab Results  Component Value Date   HGBA1C 9.3 (A) 01/28/2019    Radiology/Studies:  DG Chest 2 View  Result Date: 02/20/2019 CLINICAL DATA:  Chest pain, shortness of breath, weakness EXAM: CHEST - 2 VIEW COMPARISON:  08/07/2017 FINDINGS: Mild  cardiomegaly. Status post aortic valve replacement, CABG, and left atrial appendage clipping. Both lungs are clear. The visualized skeletal structures are unremarkable. IMPRESSION: 1.  Cardiomegaly without acute abnormality of the lungs. 2. Status post aortic valve replacement, CABG, and left atrial appendage clipping. Electronically Signed   By: Eddie Candle M.D.   On: 02/20/2019 10:57   Assessment and Plan:   1. CAD s/p CABG/AVR with angina: -Pt presented from his PCP on 02/20/2019 with complaints of chest pain with associated SOB which occurred last night while attempting to sleep.  He reports that the pain resolved on its own however lasted approximately 2-3 hours. He took an ASA and was eventually able to go to sleep and woke without recurrent symptoms. He does report he had similar symptoms prior to his cardiac stenting back in 12/2018 at which time his symptoms occurred while  laying flat and at night only and would subside upon wakening. He found relief with sitting up in the upright position, similar to his relief last night.   -EKG with mild ST changes in leads V4-V5 -HST not consistent with ACS at 30, 28 -Reports compliance with Plavix, no ASA in the setting of Eliquis -Recent PCI/stenting of the mid LAD and SVG to PDA in an outside hospital -Sedimentation rate added to most recent lab work to assess for possible pericarditis however EKG not consistent with this diagnosis>> low suspicion -We will add low-dose Imdur 15 mg p.o. daily to medical regimen and following closely in the outpatient setting for recurrent symptoms -Also would benefit from sleep study to evaluate for OSA as this could also be contributing given nocturnal symptoms -We will proceed with OP echocardiogram to evaluate for reduced LV function or wall motion abnormalities moderate HF symptoms -Does not appear to be fluid volume overloaded on exam -May be discharged from the ED with close follow-up -ED precautions  reviewed  2. Aortic stenosis with history of AVR:  -s/p surgical AVR with bioprosthetic AVR.  Stable per echocardiogram 06/2018  -Repeat echocardiogram for further evaluation in the outpatient setting  3. Atrial fib, paroxysmal:  -NSR today per telemetry review  -Compliant with Eliquis, rate controlled  4. Pericardial effusion:  -History of pericardial effusion in the postoperative setting resolved s/p pericardial window -Stable with no symptoms  5. HTN:  -Stable, 117/71, 102/63, 98/64 -Continue current regimen with hydrochlorothiazide 25 mg p.o. daily, lisinopril 10   6. Hyperlipidemia:  -Last LDL, 181 on 06/29/2018 -Continue Lipitor, Zetia and Repatha (started in [place])?  May need lipid clinic referral as I do not see this on his medication list  7. Chronic diastolic CHF:  -Euvolemic on exam today  -Low suspicion for HF  -Obtain echocardiogram to further evaluate LV function  -On home Lasix 40 mg p.o. daily  -Creatinine stable   8.  CVA: -Thought to be thromboembolic>>continue Eliquis -Has residual numbness and tingling on both left upper and lower extremities -Stable  9.  Bradycardia: -Stable, HR ranging in the low 60s -Asymptomatic  10.  Probable OSA: -Patient reports previous work-up in the past for obstructive sleep apnea for which he was placed on CPAP therapy however patient cannot tolerate -Would benefit from repeat sleep study   For questions or updates, please contact [facility] Please consult [email or web address] for contact info under Cardiology/STEMI.   SignedKathyrn Drown NP-C [facility] Pager: [phone] 02/20/2019 2:12 PM

## 2019-02-21 ENCOUNTER — Telehealth: Payer: Self-pay | Admitting: Dietician

## 2019-02-21 NOTE — Telephone Encounter (Signed)
Called Mr Gatewood to follow up on his diabetes care: He was in th car, said he took pills, took Humalog, switched out Lantus for [facility] took 16 units this morning. Threw Lantus pens away. I reminded him the dose of Soliqua should be 40 units and to start this tomorrow.   He has not checked his blood sugar today and doe snot have his meter with him, but did bring his insulin pen in case they eat out. He was encouraged to check his blood sugar often and carry his meter with him. He agreed to check it when he got home.   He agrees to a follow up appointment to replace CGM next week.

## 2019-02-25 NOTE — Progress Notes (Signed)
Cardiology Office Note   Date:  03/10/2019   ID:  Mark, Cabrera 1951/02/03, MRN [id number]  PCP:  Mark Horns, MD  Cardiologist: Dr. [name], MD   Chief Complaint  Patient presents with  . Hospitalization Follow-up    History of Present Illness: Mark Cabrera is a 68 y.o. male who presents for ED follow up, seen for Dr. [name].   Mark Cabrera has a hx of HTN, HLD, atrial fibrillation, DM, severe aortic stenosis now s/p AVR 03/2016, CAD s/p 2V CABG 03/2016 and prior CVA who was last seen in cardiac ED consultation 02/20/2019.  He presented to [facility] from his PCP on 02/20/2019 with complaints of chest pain with associated SOB which occurred last night while attempting to sleep.  He reported that the pain resolved on its own however lasted approximately 2-3 hours. He took an ASA and was eventually able to go to sleep and woke without recurrent symptoms. He did report he had similar symptoms prior to his cardiac stenting back in 12/2018 at which time his symptoms occurred while laying flat and at night only and would subside upon wakening. He found relief with sitting up in the upright position, similar to his relief on this occasion. He proceeded to go to his regularly scheduled appointment the following day at which time his sister called the office to inform them of his symptoms overnight. Given this, they recommended an ED evaluation.  In the ED, HsT found to be stable at 30>28 not consistent with ACS. EKG without ischemia however there appears to be mild T changes in leads V4-V5. CXR with cardiomegaly without acute abnormality.  He remained chest pain-free without recurrent symptoms.    He was last seen in the OP setting by Dr. [name] 01/20/2019 for CAD follow up. He was initially admitted 03/2016 and underwent surgical AVR for AS and 2V CABG (SVG to PDA and SVG to OM1) along with left atrial appendage clipping per Dr. Roxan Hockey. He had post-op atrial fib and volume overload and was  also complicated by a large pericardial effusion as well as a pleural effusion. He was readmitted and had thoracentesis and pericardial window. He was started on Lasix. Echo in 4/2020with LVEF=50-55%, severeLVH, AV bioprosthetic valve working well. The left atrium was severely dilated.He was noted to have had ongoing chest pain since surgery. Sternal wire removal 08/2017.He then had a stroke in 06/2018 felt to be due to an embolic event. He was started on Eliquis. He was admitted to [facility] with chest pain 01/10/19 in which cardiac cath showed occlusion of the SVG to the OM, severe stenosis in the body of the SVG to the PDA and CTO of the LAD. A drug eluting stent was placed in the mid LAD (.275 x 32 mm Synergy DES) and a drug eluting stent was placed in the body of the SVG to the PDA (3.5 x 20 mm Synergy DES). (Dr. Sindy Guadeloupe). He was discharged on ASA and Plavix. He was hoping to have an eye procedure while in [place] last week but his cardiac event has postponed this.  Today Mark Cabrera reports he is doing well from a cardiac perspective.  He denies any recurrent symptoms since last seen in the ED.  Reports no side effects from Imdur.  Has been followed by internal medicine service for which they have started him on metoprolol however he is unclear as to how many milligrams he is taking.  BP is stable today  at 136/70 and heart rate at 80 bpm.  He is mostly concerned today with having eye surgery.  Reports he is waiting for time when he can stop antiplatelet therapy.  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  Denies chest pain, palpitations, LE swelling, orthopnea, presyncopal or syncopal symptoms.  Has baseline dizziness/depth perception issues secondary to history of CVA.  Walks with a cane.  Lives with his sister who assists him with his medications.  Overall doing well.  Past Medical History:  Diagnosis Date  . Acute diastolic congestive  heart failure ([place])   . Aortic valve stenosis s/p AVR 2018  . Atrial fibrillation ([place]) - post-op CABG    04/2016 CHA2DS2VAS score = 5  . Atypical nevi   . Coronary artery disease s/p 2 vessel CABG   . Depression    "years ago"  . Diabetes mellitus   . Dyspnea    in the past   . GERD (gastroesophageal reflux disease)   . Heart murmur   . Hepatic cirrhosis ([place]) 06/29/2017  . History of kidney stones   . Hyperlipidemia    hx of transaminitis secondary to statin and he has decided not to use statins secondary to potential side effects.  . Hypertension   . Nephrolithiasis   . Osteoarthritis, knee   . Sleep apnea     Central apnea. Not using cpap  . Stroke [facility]) 2007  . Transaminitis     Statin-induced  . 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 2-D echo was normal no embolic source found.    Past Surgical History:  Procedure Laterality Date  . AORTIC VALVE REPLACEMENT N/A 03/30/2016   Procedure: AORTIC VALVE REPLACEMENT (AVR);  Surgeon: [name], MD;  Location: [place];  Service: Open Heart Surgery;  Laterality: N/A;  . CARDIAC CATHETERIZATION N/A 03/15/2016   Procedure: Right/Left Heart Cath and Coronary Angiography;  Surgeon: Burnell Blanks, MD;  Location: [place] CV LAB;  Service: Cardiovascular;  Laterality: N/A;  . CLIPPING OF ATRIAL APPENDAGE N/A 03/30/2016   Procedure: CLIPPING OF LEFT ATRIAL APPENDAGE;  Surgeon: [name], MD;  Location: [place];  Service: Open Heart Surgery;  Laterality: N/A;  . CORONARY ARTERY BYPASS GRAFT N/A 03/30/2016   Procedure: CORONARY ARTERY BYPASS GRAFTING (CABG) Times Two;  Surgeon: [name], MD;  Location: [place];  Service: Open  Heart Surgery;  Laterality: N/A;  . KNEE ARTHROSCOPY W/ PARTIAL MEDIAL MENISCECTOMY  05/12/2005   right, performed by Dr. French Ana for torn medial meniscus.  Marland Kitchen ROTATOR CUFF REPAIR Right 2016  . STERNAL WIRES REMOVAL N/A 08/13/2017   Procedure: STERNAL WIRES REMOVAL;  Surgeon: [name], MD;  Location: [place];  Service: Thoracic;  Laterality: N/A;  . SUBXYPHOID PERICARDIAL WINDOW N/A 04/21/2016   Procedure: SUBXYPHOID PERICARDIAL WINDOW;  Surgeon: [name], MD;  Location: [place];  Service: Thoracic;  Laterality: N/A;  . TEE WITHOUT CARDIOVERSION N/A 03/30/2016   Procedure: TRANSESOPHAGEAL ECHOCARDIOGRAM (TEE);  Surgeon: [name], MD;  Location: [place];  Service: Open Heart Surgery;  Laterality: N/A;  . TEE WITHOUT CARDIOVERSION N/A 04/21/2016   Procedure: TRANSESOPHAGEAL ECHOCARDIOGRAM (TEE);  Surgeon: [name], MD;  [facility];  Service: Thoracic;  Laterality: N/A;     Current Outpatient Medications  Medication Sig Dispense Refill  . acetaminophen (TYLENOL) 325 MG tablet Take 2 tablets (650 mg total) by mouth every 4 (four) hours as needed for headache or mild pain.    Marland Kitchen atorvastatin (LIPITOR) 80 MG tablet Take 1 tablet (80 mg total) by mouth daily at 6 PM. 30 tablet 0  . BAYER MICROLET LANCETS lancets Check blood sugar 3 times a day as instructed 100 each 12  . clopidogrel (PLAVIX) 75 MG tablet Take 75 mg by mouth daily.    Marland Kitchen ELIQUIS 5 MG TABS tablet Take 1 tablet by mouth twice daily 60 tablet 2  . Evolocumab 140 MG/ML SOAJ Inject 1 mL into the skin every 14 (fourteen) days. 1 pen 12  . ezetimibe (ZETIA) 10 MG tablet Take 10 mg by mouth at bedtime.    . furosemide (LASIX) 40 MG tablet Take 1 tablet by mouth daily.    Marland Kitchen gabapentin (NEURONTIN) 100 MG capsule Take 1 capsule (100 mg total) by mouth 3 (three) times daily. 270 capsule 2  . glucose blood (CONTOUR NEXT TEST) test strip Check blood sugar 3 times a day as instructed 100 each 12  .  hydrochlorothiazide (HYDRODIURIL) 12.5 MG tablet Take 2 tablets (25 mg total) by mouth daily. 90 tablet 1  . insulin aspart (NOVOLOG) 100 UNIT/ML FlexPen Inject 18 Units into the skin 3 (three) times daily with meals. (Patient taking differently: Inject 20 Units into the skin 3 (three) times daily with meals. ) 15 mL 11  . Insulin Glargine-Lixisenatide (SOLIQUA) 100-33 UNT-MCG/ML SOPN Inject 40 Units into the skin every morning. (Patient taking differently: Inject 30 Units into the skin every morning. ) 15 mL 3  . Insulin Pen Needle 31G X 5 MM MISC Use one pen needle three times daily. Dx E11.49 100 each 3  . isosorbide mononitrate (IMDUR) 30 MG 24 hr tablet Take 1 tablet (30 mg total) by mouth daily. 30 tablet 1  . lisinopril (ZESTRIL) 10 MG tablet Take 10 mg by mouth daily.    . metFORMIN (GLUCOPHAGE) 500 MG tablet Take 1 tablet (500 mg total) by mouth 2 (two) times daily with a meal. 60 tablet 2  . neomycin-polymyxin-hydrocortisone (CORTISPORIN) 3.5-10000-1 OTIC suspension Place 3 drops into the left ear 4 (four) times daily. 10 mL 0  . nitroGLYCERIN (NITROSTAT) 0.4 MG SL tablet PLACE 1 TABLET UNDER THE TONGUE EVERY 5 MINUTES AS NEEDED FOR CHEST PAIN 25 tablet 0   No current facility-administered medications for this visit.    Allergies:   Penicillins, Statins, Morphine and related, and Sglt2 inhibitors    Social History:  The patient  reports that he has never smoked. He has never used smokeless tobacco. He reports current alcohol use of about 1.0 standard drinks of alcohol per week. He reports that he does not use drugs.   Family History:  The patient's family history includes Alcohol abuse in his father; Diabetes in his mother; Hypertension in his mother.    ROS:  Please see the history of present illness.  Otherwise, review of systems are positive for none.   All other systems are reviewed and negative.    PHYSICAL EXAM: VS:  There were no vitals taken for this visit. , BMI There is  no height or weight on file to calculate BMI.   General: Well developed, well nourished, NAD Skin: Warm, dry, intact  Head: Normocephalic, atraumatic, sclera non-icteric, no xanthomas, clear, moist mucus membranes. Neck: Negative for carotid bruits. No JVD Lungs:Clear to ausculation bilaterally. No wheezes, rales, or rhonchi. Breathing is unlabored. Cardiovascular: RRR with S1 S2. No murmurs, rubs, gallops, or LV heave appreciated. Abdomen: Soft, non-tender, non-distended with normoactive bowel sounds. No hepatomegaly, No rebound/guarding. No obvious abdominal masses. MSK: Strength and tone appear normal for age. 5/5 in all extremities Extremities: No edema. No clubbing or cyanosis. DP/PT pulses 2+ bilaterally Neuro: Alert and oriented. No focal deficits. No facial asymmetry. MAE spontaneously. Psych: Responds to questions appropriately with normal affect.     EKG:  EKG is not ordered today.   Recent Labs: 07/02/2018: ALT 18 02/20/2019: BUN 18; Creatinine, Ser 1.16; Hemoglobin 12.4; Platelets 211; Potassium 4.0; Sodium 137    Lipid Panel    Component Value Date/Time   CHOL 274 (H) 06/29/2018 0420   CHOL 323 (H) 01/18/2018 0900   TRIG 242 (H) 06/29/2018 0420   HDL 45 06/29/2018 0420   HDL 52 01/18/2018 0900   CHOLHDL 6.1 06/29/2018 0420   VLDL 48 (H) 06/29/2018 0420   LDLCALC 181 (H) 06/29/2018 0420   LDLCALC 223 (H) 01/18/2018 0900      Wt Readings from Last 3 Encounters:  02/27/19 213 lb 9.6 oz (96.9 kg)  02/13/19 211 lb 6.4 oz (95.9 kg)  01/28/19 214 lb 14.4 oz (97.5 kg)     Other studies Reviewed: Additional studies/ records that were reviewed today include:   EchoApril 2020: 1. The left ventricle has low normal systolic function, with an ejection fraction of 50-55%. The cavity size was mildly dilated. There is severely increased left ventricular wall thickness. Left ventricular diastolic Doppler parameters are consistent  with pseudonormalization. Elevated left  ventricular end-diastolic pressure. 2. The right ventricle has normal systolic function. The cavity was normal. There is no increase in right ventricular wall thickness. 3. Left atrial size was severely dilated. 4. Right atrial size was mildly dilated. 5. The mitral valve is degenerative. Mild thickening of the mitral valve leaflet. Mild calcification of the mitral valve leaflet. 6. The aortic valve was not well visualized. Moderate thickening of the aortic valve. Moderate calcification of the aortic valve. Mild stenosis of the aortic valve. 7. The interatrial septum was not well visualized.  CATH: See EPIC  ASSESSMENT AND PLAN:  1. CAD s/p CABG/AVR with angina: -Pt presented from his PCP on 02/20/2019 with complaints of chest pain with associated SOB which occurred last night while attempting to sleep.   -Reports compliance with Plavix, no ASA in the setting of Eliquis -Recent PCI/stenting of the mid LAD and SVG to PDA in an outside hospital -Given noctural symptoms>>low suspicion for ACS -Imdur 30 mg p.o. daily  added to medical regimen -Denies recurrent symptoms today -Discussed sleep study to evaluate for OSA as this could also be contributing given nocturnal symptoms>> deferred at this time -Plan for echocardiogram prior to follow-up visit with Dr. [name] 04/2019   2. Aortic stenosis with history of AVR:  -s/p surgical AVR with bioprosthetic AVR.  Stable per echocardiogram 06/2018  -Repeat echocardiogram for further evaluation -No symptoms  3. Atrial fib, paroxysmal: -Compliant with Eliquis, rate controlled  4. Pericardial effusion: -History of pericardial effusion in the postoperative setting resolved s/p pericardial window -Stable with no symptoms  5. HTN: -Stable, 136/70 -Continue current regimen with Imdur, lisinopril, metoprolol??  Is having his sister write down all of his medications prior to next appointment  6. Hyperlipidemia: -Last LDL,  181 on  06/29/2018 -Continue Lipitor, Zetia and Repatha (started in [place])  7. Chronic diastolic CHF: -Euvolemic on exam today   -On home Lasix 40 mg p.o. daily   8.  CVA: -Thought to be thromboembolic>>continue Eliquis -Has residual numbness and tingling on both left upper and lower extremities -Stable, no concerns  9.  Probable OSA: -Patient reports previous work-up in the past for obstructive sleep apnea for which he was placed on CPAP therapy however patient cannot tolerate -Would benefit from repeat sleep study  Current medicines are reviewed at length with the patient today.  The patient does not have concerns regarding medicines.  The following changes have been made:  no change  Labs/ tests ordered today include: Echocardiogram No orders of the defined types were placed in this encounter.   Disposition:   FU with Dr. [name] in 1 month  Signed, Kathyrn Drown, NP  03/10/2019 10:40 AM    [address], [place], [place]  [address] Phone: [phone]; Fax: [phone]

## 2019-02-27 ENCOUNTER — Ambulatory Visit (INDEPENDENT_AMBULATORY_CARE_PROVIDER_SITE_OTHER): Payer: Medicare Other | Admitting: Dietician

## 2019-02-27 ENCOUNTER — Telehealth: Payer: Self-pay | Admitting: Internal Medicine

## 2019-02-27 ENCOUNTER — Encounter: Payer: Self-pay | Admitting: Dietician

## 2019-02-27 ENCOUNTER — Other Ambulatory Visit: Payer: Self-pay | Admitting: Dietician

## 2019-02-27 DIAGNOSIS — Z6833 Body mass index (BMI) 33.0-33.9, adult: Secondary | ICD-10-CM | POA: Diagnosis not present

## 2019-02-27 DIAGNOSIS — E1140 Type 2 diabetes mellitus with diabetic neuropathy, unspecified: Secondary | ICD-10-CM | POA: Diagnosis not present

## 2019-02-27 DIAGNOSIS — Z794 Long term (current) use of insulin: Secondary | ICD-10-CM | POA: Diagnosis not present

## 2019-02-27 DIAGNOSIS — Z713 Dietary counseling and surveillance: Secondary | ICD-10-CM

## 2019-02-27 NOTE — Telephone Encounter (Signed)
I spoke with Beverlee Nims, Mr Deasis's sister. She verbalized similar events as patient told me about at his visit today that pills were missing and she thinks he got up in the middle of the night and took them. She thinks he needs memory care rather than assisted living. I told her I would be sure his doctor knows.

## 2019-02-27 NOTE — Patient Instructions (Addendum)
Walks, eating beans, nuts and whole grains and maybe Co Q10 may help cramps.   Follow up with doctors in January.   Diabetes Medicines 12.7.2020:  1- Before meal 1 of the day - take 18 units of Novolog insulin 2- Before meal 1 of the day- take 40 units of Soliqua 3-  take 500 mg metformin in AM   4- Before meal 2 of the day- take 18 units of Novolog insulin   5- Before meal 3 of the day- inject 18 units of Novolog insulin 6- Take 500 mg metformin in PM  You or your sister can call me anytime!   [name] [phone]

## 2019-02-27 NOTE — Progress Notes (Addendum)
Diabetes Self-Management Education  Visit Type: (P) Follow-up  Appt. Start Time: 915 Appt. End Time: [id number]  02/27/2019  [name], identified by name and date of birth, is a 68 y.o. male with a diagnosis of Diabetes:  .   ASSESSMENT  Weight 213 lb 9.6 oz (96.9 kg). Body mass index is 33.45 kg/m.   He has been taking the following diabetes medicine since his last visit:  30units soliqua daily 20 units Novolog  3x/day 500 mg metformin twice a day  Mr. Elgart reports that his brother picks up his medicine for him from the pharmacy, his brother's wife fills his pill boxes and his brother brings then to him every Wednesday. He is living with his sister who is trying to help him with his medicines. He tells me today that she was upset today because tonight and tomorrows medicines are missing from his pillbox. He does not think he took them. However, he states that he is having trouble with his vision and memory  He brought his meter today: He checked his blood sugar 28 times in 30 days over 14 days. the range is 126 to 387 with 79% above target and 0% below target and 21% in target. The average of all readings is 244 mg/dl.   Out of pen form of cholesterol medicine that he injects and was started on in [place]- he request a refill  Having problems on toenails due to vision- agrees to podiatry referral  Has an appointment on 28th to see cardiac doctor for medical clearance for eye surgery by Dr. 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   He did not want a CGM today because he did not want to come back in 1 and 2 weeks. He states that he may be going to an assisted living and is not sure they can bring hi to those appointments  Diabetes Self-Management Education - 02/27/19 1100      Visit Information   Visit Type  Follow-up  (Pended)        Individualized Plan for Diabetes Self-Management Training:   Learning Objective:  Patient will have a greater understanding of diabetes  self-management. Patient education plan is to attend individual and/or group sessions per assessed needs and concerns.   Plan:   Patient Instructions  Walks, eating beans, nuts and whole grains and maybe Co Q10 may help cramps.   Follow up with doctors in January.   Diabetes Medicines 12.7.2020:  1- Before meal 1 of the day - take 18 units of Novolog insulin 2- Before meal 1 of the day- take 40 units of Soliqua 3-  take 500 mg metformin in AM   4- Before meal 2 of the day- take 18 units of Novolog insulin   5- Before meal 3 of the day- inject 18 units of Novolog insulin 6- Take 500 mg metformin in PM  You or your sister can call me anytime!   [name] [phone]    Expected Outcomes:     Education material provided: Diabetes Resources  If problems or questions, patient to contact team via:  Phone  Future DSME appointment:  4 weeks  Debera Lat, RD 02/27/2019 11:52 AM.

## 2019-02-27 NOTE — Telephone Encounter (Signed)
Pt sister would like a call back regarding pt medicine Beverlee Nims [phone]  Pt is here, pt sister had her grandchildren

## 2019-02-27 NOTE — Telephone Encounter (Signed)
Out of pen form of cholesterol medicine that he injects and was started on in [place]- he request a refill

## 2019-03-02 MED ORDER — EVOLOCUMAB 140 MG/ML ~~LOC~~ SOAJ: 1.0000 mL | SUBCUTANEOUS | 12 refills | Status: DC

## 2019-03-04 ENCOUNTER — Telehealth: Payer: Self-pay | Admitting: *Deleted

## 2019-03-04 NOTE — Telephone Encounter (Addendum)
Information was called to [facility]  for PA for Repatha Sure 140 mg/ML injection.   Awaiting determination within 4 days.  PA -[id number].  Sander Nephew, RN 03/04/2019 10:00 AM.  Additional information faxed to [facility] for PA for Repatha. Sander Nephew, RN 03/05/2019 12:35 PM. PA for Repatha was approved 03/04/2019 thru 09/02/2018 under patient's Medicare Part D Plan.  Sander Nephew, RN 03/11/2019 8:57 AM.

## 2019-03-05 ENCOUNTER — Other Ambulatory Visit: Payer: Self-pay | Admitting: Internal Medicine

## 2019-03-05 DIAGNOSIS — I119 Hypertensive heart disease without heart failure: Secondary | ICD-10-CM

## 2019-03-10 ENCOUNTER — Encounter: Payer: Self-pay | Admitting: Cardiology

## 2019-03-10 ENCOUNTER — Other Ambulatory Visit: Payer: Self-pay

## 2019-03-10 ENCOUNTER — Ambulatory Visit (INDEPENDENT_AMBULATORY_CARE_PROVIDER_SITE_OTHER): Payer: Medicare Other | Admitting: Cardiology

## 2019-03-10 VITALS — BP 130/70 | HR 80 | Ht 67.0 in | Wt 213.4 lb

## 2019-03-10 DIAGNOSIS — I2510 Atherosclerotic heart disease of native coronary artery without angina pectoris: Secondary | ICD-10-CM

## 2019-03-10 DIAGNOSIS — I480 Paroxysmal atrial fibrillation: Secondary | ICD-10-CM

## 2019-03-10 DIAGNOSIS — I6300 Cerebral infarction due to thrombosis of unspecified precerebral artery: Secondary | ICD-10-CM

## 2019-03-10 DIAGNOSIS — I350 Nonrheumatic aortic (valve) stenosis: Secondary | ICD-10-CM

## 2019-03-10 DIAGNOSIS — I5032 Chronic diastolic (congestive) heart failure: Secondary | ICD-10-CM

## 2019-03-10 DIAGNOSIS — I5031 Acute diastolic (congestive) heart failure: Secondary | ICD-10-CM | POA: Diagnosis not present

## 2019-03-10 NOTE — Patient Instructions (Addendum)
Medication Instructions:  Your physician recommends that you continue on your current medications as directed. Please refer to the Current Medication list given to you today.  *If you need a refill on your cardiac medications before your next appointment, please call your pharmacy*  Lab Work:  None ordered today  Testing/Procedures:  Your physician has requested that you have an echocardiogram on 03/26/19 at 10:15AM. Echocardiography is a painless test that uses sound waves to create images of your heart. It provides your doctor with information about the size and shape of your heart and how well your heart's chambers and valves are working. This procedure takes approximately one hour. There are no restrictions for this procedure.   Follow-Up: At [facility], you and your health needs are our priority.  As part of our continuing mission to provide you with exceptional heart care, we have created designated Provider Care Teams.  These Care Teams include your primary Cardiologist (physician) and Advanced Practice Providers (APPs -  Physician Assistants and Nurse Practitioners) who all work together to provide you with the care you need, when you need it.  Your next appointment:    Keep your follow up on 04/14/19 with Dr. [name] at 10:40AM

## 2019-03-21 ENCOUNTER — Telehealth: Payer: Self-pay | Admitting: Internal Medicine

## 2019-03-21 MED ORDER — IVERMECTIN 0.5 % EX LOTN: 1.0000 | TOPICAL_LOTION | Freq: Once | CUTANEOUS | 0 refills | Status: AC

## 2019-03-21 NOTE — Telephone Encounter (Signed)
Prescription sent into [facility] for Ivermectin lotion for patient to apply 1 tube to dry hair, coating hair and scalp thoroughly, and rinse with water after 10 minutes. If this prescription is to expensive, he can look for over-the-counter treatment options with Permethrin 1% cream.

## 2019-03-21 NOTE — Telephone Encounter (Signed)
Pt is need in some head lice medicine [id number].. [facility] [address] [place] [address]

## 2019-03-21 NOTE — Telephone Encounter (Signed)
Pt called and notified of recommendations from Dr. Ronnald Ramp. SChaplin, RN,BSN

## 2019-03-21 NOTE — Telephone Encounter (Signed)
RTC, RN asked pt if he has tried any OTC treatments yet, pt states he has not and he just discovered the head lice this morning.  Will forward to pcp to advise on OTC treatment vs RX treatment. Thank you, SChaplin, RN,BSN

## 2019-03-25 ENCOUNTER — Telehealth: Payer: Self-pay | Admitting: Cardiovascular Disease

## 2019-03-25 NOTE — Telephone Encounter (Signed)
Left the pt a message to call the office back and request to speak with any triage nurse, for further assistance with echo question.

## 2019-03-25 NOTE — Telephone Encounter (Signed)
Patient calling to discuss whether he needs an echo for his eye surgery. He states he had to cancel his echo tomorrow 1/13, because he did not have a ride and is unsure when he will have transportation.

## 2019-03-26 ENCOUNTER — Other Ambulatory Visit (HOSPITAL_COMMUNITY): Payer: Medicare Other

## 2019-03-26 ENCOUNTER — Telehealth: Payer: Self-pay | Admitting: Internal Medicine

## 2019-03-26 NOTE — Telephone Encounter (Signed)
Daughter returned call. States patient didn't end up going to [facility] in November but they still have a bed available. States Cari Caraway said the [facility] form completed on 11/07/2018 is now out of date. [name] will call Cari Caraway to see if visit to discuss FL2 can be done by telehealth or if patient needs an in-person visit. She will let us know so he can be scheduled appropriately. Hubbard Hartshorn, BSN, RN-BC

## 2019-03-26 NOTE — Telephone Encounter (Signed)
Pt's daughter is rtn your phone call. The nursing home is not requiring the patient to have another visit.  Pt only needs a new form with a new updated form with currents medications.  Please call patient back

## 2019-03-26 NOTE — Telephone Encounter (Signed)
Spoke with [name]. States Cari Caraway does not need patient to have another visit just an updated FL2 with updated problem list and medication list. Will give form to Dr. [name] for completion. Hubbard Hartshorn, BSN, RN-BC

## 2019-03-26 NOTE — Telephone Encounter (Signed)
Pt daughter is wanting a FL2 form complete; pt contact [phone]

## 2019-03-26 NOTE — Telephone Encounter (Signed)
FL2 completed 11/07/2018 by Dr. [name] and picked up by patient's daughter on that date. Form was also faxed to [facility] on 02/05/2019. Call placed to patient's daughter. No answer. Left message on VM with this information and requesting return call if needed. Hubbard Hartshorn, RN, BSN

## 2019-03-27 ENCOUNTER — Telehealth: Payer: Self-pay | Admitting: Internal Medicine

## 2019-03-27 NOTE — Telephone Encounter (Signed)
Fax [phone]

## 2019-03-27 NOTE — Telephone Encounter (Signed)
Daughter returned call with fax number [phone]. FL2 faxed to her with confirmation receipt received. Hubbard Hartshorn, BSN, RN-BC

## 2019-03-27 NOTE — Telephone Encounter (Signed)
Completed FL2 faxed to [facility] at [phone]. Confirmation receipt received. Daughter made aware. She will call back with her fax number so she may have a copy. Hubbard Hartshorn, BSN, RN-BC

## 2019-03-28 NOTE — Telephone Encounter (Signed)
Patient has follow up with Dr. [name] on 04/14/19 for follow up, can discuss echo/surgery at this office visit.

## 2019-04-03 ENCOUNTER — Telehealth: Payer: Self-pay | Admitting: *Deleted

## 2019-04-03 DIAGNOSIS — F0390 Unspecified dementia without behavioral disturbance: Secondary | ICD-10-CM | POA: Diagnosis not present

## 2019-04-03 DIAGNOSIS — F339 Major depressive disorder, recurrent, unspecified: Secondary | ICD-10-CM | POA: Diagnosis not present

## 2019-04-03 MED ORDER — MALATHION 0.5 % EX LOTN: TOPICAL_LOTION | Freq: Once | CUTANEOUS | 0 refills | Status: AC

## 2019-04-03 NOTE — Telephone Encounter (Signed)
Malathion lotion prescription sent to [facility].

## 2019-04-03 NOTE — Telephone Encounter (Signed)
Information was sent to [facility] for [place] for Ivermectin Lotion 0.5%. Denial was received as patient's insurance does not cover.  Patient will need to try Malathion lotion which is the preferred.  Message sent to Dr. Lacey Jensen to consider a change.  Sander Nephew, RN 04/03/2019 8:44 AM.

## 2019-04-11 ENCOUNTER — Other Ambulatory Visit: Payer: Self-pay

## 2019-04-11 ENCOUNTER — Ambulatory Visit (HOSPITAL_COMMUNITY): Payer: Medicare Other | Attending: Cardiology

## 2019-04-11 DIAGNOSIS — I350 Nonrheumatic aortic (valve) stenosis: Secondary | ICD-10-CM | POA: Insufficient documentation

## 2019-04-11 DIAGNOSIS — I2510 Atherosclerotic heart disease of native coronary artery without angina pectoris: Secondary | ICD-10-CM | POA: Diagnosis not present

## 2019-04-11 DIAGNOSIS — I5031 Acute diastolic (congestive) heart failure: Secondary | ICD-10-CM | POA: Diagnosis not present

## 2019-04-14 ENCOUNTER — Other Ambulatory Visit: Payer: Self-pay

## 2019-04-14 ENCOUNTER — Encounter: Payer: Self-pay | Admitting: Cardiovascular Disease

## 2019-04-14 ENCOUNTER — Ambulatory Visit (INDEPENDENT_AMBULATORY_CARE_PROVIDER_SITE_OTHER): Payer: Medicare Other | Admitting: Cardiovascular Disease

## 2019-04-14 VITALS — BP 102/64 | HR 86 | Ht 67.0 in | Wt 214.0 lb

## 2019-04-14 DIAGNOSIS — I2510 Atherosclerotic heart disease of native coronary artery without angina pectoris: Secondary | ICD-10-CM

## 2019-04-14 DIAGNOSIS — Z01810 Encounter for preprocedural cardiovascular examination: Secondary | ICD-10-CM | POA: Diagnosis not present

## 2019-04-14 DIAGNOSIS — I5032 Chronic diastolic (congestive) heart failure: Secondary | ICD-10-CM | POA: Diagnosis not present

## 2019-04-14 DIAGNOSIS — E785 Hyperlipidemia, unspecified: Secondary | ICD-10-CM

## 2019-04-14 DIAGNOSIS — I350 Nonrheumatic aortic (valve) stenosis: Secondary | ICD-10-CM

## 2019-04-14 DIAGNOSIS — I480 Paroxysmal atrial fibrillation: Secondary | ICD-10-CM | POA: Diagnosis not present

## 2019-04-14 MED ORDER — EZETIMIBE 10 MG PO TABS: 10.0000 mg | ORAL_TABLET | Freq: Every day | ORAL | 3 refills | Status: DC

## 2019-04-14 MED ORDER — FUROSEMIDE 40 MG PO TABS: 40.0000 mg | ORAL_TABLET | Freq: Every day | ORAL | 3 refills | Status: DC

## 2019-04-14 NOTE — Patient Instructions (Addendum)
Medication Instructions:  Your physician has recommended you make the following change in your medication:  TAKE THESE MEDICATIONS FOR YOUR HEART: Clopidogrel (Plavix) 75 mg once daily  Ezetimibe (Zetia) 10 mg daily Furosemide (Lasix) 40 mg daily in the morning Lisinopril (Zestril) 10 mg once daily Nitroglycerin - put a pill under your tongue if you have chest pain Apixaban (Eliquis) 5 mg twice daily - morning and evening  *If you need a refill on your cardiac medications before your next appointment, please call your pharmacy*  Lab Work: None Ordered If you have labs (blood work) drawn today and your tests are completely normal, you will receive your results only by: Marland Kitchen MyChart Message (if you have MyChart) OR . A paper copy in the mail If you have any lab test that is abnormal or we need to change your treatment, we will call you to review the results.  Testing/Procedures: None Ordered   Follow-Up: At [facility], you and your health needs are our priority.  As part of our continuing mission to provide you with exceptional heart care, we have created designated Provider Care Teams.  These Care Teams include your primary Cardiologist (physician) and Advanced Practice Providers (APPs -  Physician Assistants and Nurse Practitioners) who all work together to provide you with the care you need, when you need it.  Your next appointment:   6 month(s)  The format for your next appointment:   In Person  Provider:   You may see Lauree Chandler, MD or one of the following Advanced Practice Providers on your designated Care Team:    Melina Copa, PA-C  Ermalinda Barrios, PA-C   Other Instructions You have been referred to Lipid Clinic for management of your Repatha for high cholesterol Come back to our office on February 18 at 1:30 for an appointment with our Pharmacist

## 2019-04-14 NOTE — Progress Notes (Signed)
Chief Complaint  Patient presents with  . 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 He was admitted to [facility] January 2018 and underwent surgical AVR for AS and 2V CABG (SVG to PDA and SVG to OM1) along with left atrial appendage clipping per Dr. Roxan Hockey. He has post-op atrial fib and volume overload. His post-operative course was also complicated by a large pericardial effusion and pleural effusion. He was readmitted and had thoracentesis and pericardial window. He had issues with volume overload post-op and was started on Lasix. Last echo April 2020 with LVEF=50-55%, severe LVH, AV bioprosthetic valve working well. The left atrium was severely dilated. Ongoing chest pain since surgery. Sternal wire removal June 2019. Stroke April 2020 felt to be due to an embolic event. He was started on Eliquis. He was admitted to [facility] with chest pain 01/10/19. Cardiac cath 01/10/19 with occlusion of the SVG to the OM, severe stenosis in the body of the SVG to the PDA and CTO of the LAD. A drug eluting stent was placed in the mid LAD (2.75 x 32 mm Synergy DES) and a drug eluting stent was placed in the body of the SVG to the PDA (3.5 x 20 mm Synergy DES).  (Dr. Sindy Guadeloupe).  He was discharged on ASA and Plavix. He was seen in the [facility] ED 02/20/19 with c/o chest pain but no evidence of ACS. Echo 04/11/19 with LVEF=50-55%, severe LVH, normally functioning AVR.   He is now living in assisted living in [place]. He does not know what medications he is taking at his facility. He thinks the Eliquis is being given once daily. He thinks he is taking Plavix but he has not had a Ranexa injection and he does not think he has been receiving Lisinopril. He states that he is statin intolerant  but at the last visit we thought he was taking Lipitor. He is no longer taking Zetia or Lasix. He is doing well from a cardiac standpoint. The patient denies any chest pain, dyspnea, palpitations, lower extremity edema, orthopnea, PND, dizziness, near syncope or syncope. He is most interested in stopping Plavix and Eliquis for an eye procedure. He loves to paint and cannot paint with his retinal issue.  He is seeing ophthalmology, Dr. Baird Cancer ([address] ) for his retinal issue. He is asking to stop Plavix and Eliquis to have his procedure.   Primary Care Physician: Ladona Horns, MD  Past Medical History:  Diagnosis Date  . Acute diastolic congestive heart failure ([place])   . Aortic valve stenosis s/p AVR 2018  . Atrial fibrillation ([place]) - post-op CABG    04/2016 CHA2DS2VAS score = 5  . Atypical nevi   . Coronary artery disease s/p 2 vessel CABG   . Depression    "years ago"  . Diabetes mellitus   . Dyspnea    in the past   . GERD (gastroesophageal reflux disease)   . Heart murmur   . Hepatic cirrhosis ([place]) 06/29/2017  . History of kidney stones   . Hyperlipidemia    hx of transaminitis secondary to statin and he has decided not to use statins secondary to potential side effects.  . Hypertension   . Nephrolithiasis   .  Osteoarthritis, knee   . Sleep apnea     Central apnea. Not using cpap  . Stroke [facility]) 2007  . Transaminitis     Statin-induced  . 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 2-D echo was normal no embolic source found.    Past Surgical History:  Procedure Laterality Date  . AORTIC VALVE REPLACEMENT N/A 03/30/2016   Procedure: AORTIC VALVE REPLACEMENT (AVR);  Surgeon: [name], MD;  Location: [place];  Service: Open Heart Surgery;  Laterality: N/A;  . CARDIAC CATHETERIZATION N/A 03/15/2016   Procedure: Right/Left Heart Cath and Coronary Angiography;  Surgeon: Burnell Blanks, MD;  Location: [place] CV LAB;  Service: Cardiovascular;  Laterality: N/A;  . CLIPPING OF ATRIAL APPENDAGE N/A 03/30/2016   Procedure: CLIPPING OF LEFT ATRIAL APPENDAGE;  Surgeon: [name], MD;  Location: [place];  Service: Open Heart Surgery;  Laterality: N/A;  . CORONARY ARTERY BYPASS GRAFT N/A 03/30/2016   Procedure: CORONARY ARTERY BYPASS GRAFTING (CABG) Times Two;  Surgeon: [name], MD;  Location: [place];  Service: Open Heart Surgery;  Laterality: N/A;  . KNEE ARTHROSCOPY W/ PARTIAL MEDIAL MENISCECTOMY  05/12/2005   right, performed by Dr. French Ana for torn medial meniscus.  Marland Kitchen ROTATOR CUFF REPAIR Right 2016  . STERNAL WIRES REMOVAL N/A 08/13/2017   Procedure: STERNAL WIRES REMOVAL;  Surgeon: [name], MD;  Location: [place];  Service: Thoracic;  Laterality: N/A;  . SUBXYPHOID PERICARDIAL WINDOW N/A 04/21/2016   Procedure: SUBXYPHOID PERICARDIAL WINDOW;  Surgeon: [name], MD;  Location: [place];  Service: Thoracic;  Laterality: N/A;  . TEE WITHOUT CARDIOVERSION N/A 03/30/2016   Procedure: TRANSESOPHAGEAL ECHOCARDIOGRAM (TEE);  Surgeon: [name], MD;  Location: [place];  Service: Open Heart Surgery;  Laterality: N/A;  . TEE WITHOUT CARDIOVERSION N/A 04/21/2016   Procedure: TRANSESOPHAGEAL ECHOCARDIOGRAM (TEE);  Surgeon: [name], MD;  Location: [facility] OR;  Service: Thoracic;  Laterality: N/A;    Current Outpatient Medications  Medication Sig Dispense Refill  . acetaminophen (TYLENOL) 325 MG tablet Take 2 tablets (650 mg total) by mouth every 4 (four) hours as needed for headache or mild pain.    Marland Kitchen apixaban (ELIQUIS) 5 MG TABS tablet Take 5 mg by mouth 2 (two) times daily.    Marland Kitchen BAYER MICROLET LANCETS lancets Check blood  sugar 3 times a day as instructed 100 each 12  . clopidogrel (PLAVIX) 75 MG tablet Take 75 mg by mouth daily.    . Evolocumab 140 MG/ML SOAJ Inject 1 mL into the skin every 14 (fourteen) days. 1 pen 12  . glucose blood (CONTOUR NEXT TEST) test strip Check blood sugar 3 times a day as instructed 100 each 12  . insulin aspart (NOVOLOG) 100 UNIT/ML FlexPen Inject 18 Units into the skin 3 (three) times daily with meals. 15 mL 11  . Insulin Glargine-Lixisenatide (SOLIQUA) 100-33 UNT-MCG/ML SOPN Inject 40 Units into the skin every morning. 15 mL 3  . Insulin Pen Needle 31G X 5 MM MISC Use one pen needle three times daily. Dx E11.49 100 each 3  . lisinopril (ZESTRIL) 10 MG tablet Take 10 mg by mouth daily.    . metFORMIN (GLUCOPHAGE) 500 MG tablet  Take 1 tablet (500 mg total) by mouth 2 (two) times daily with a meal. 60 tablet 2  . neomycin-polymyxin-hydrocortisone (CORTISPORIN) 3.5-10000-1 OTIC suspension Place 3 drops into the left ear 4 (four) times daily. 10 mL 0  . nitroGLYCERIN (NITROSTAT) 0.4 MG SL tablet PLACE 1 TABLET UNDER THE TONGUE EVERY 5 MINUTES AS NEEDED FOR CHEST PAIN 25 tablet 0  . ezetimibe (ZETIA) 10 MG tablet Take 1 tablet (10 mg total) by mouth daily. 90 tablet 3  . furosemide (LASIX) 40 MG tablet Take 1 tablet (40 mg total) by mouth daily. 90 tablet 3   No current facility-administered medications for this visit.    Allergies  Allergen Reactions  . Penicillins Other (See Comments)    UNSPECIFIED REACTIONS  Has patient had a PCN reaction causing immediate rash, facial/tongue/throat swelling, SOB or lightheadedness with hypotension: Unk Has patient had a PCN reaction causing severe rash involving mucus membranes or skin necrosis: Unk Has patient had a PCN reaction that required hospitalization: Unk Has patient had a PCN reaction occurring within the last 10 years: No If all of the above answers are "NO", then may proceed with Cephalosporin use.   . Statins Other (See  Comments)    Severe rash and back pain  . Morphine And Related Other (See Comments)    hallucinations  . Sglt2 Inhibitors Rash    Candidiasis infection prone    Social History   Socioeconomic History  . Marital status: Divorced    Spouse name: Not on file  . Number of children: 1  . Years of education: 2y college  . Highest education level: Not on file  Occupational History  . Occupation:  Medicaid [place]    Employer: UNEMPLOYED    Comment: following stroke in 2007  Tobacco Use  . Smoking status: Never Smoker  . Smokeless tobacco: Never Used  Substance and Sexual Activity  . Alcohol use: Yes    Alcohol/week: 1.0 standard drinks    Types: 1 Cans of beer per week    Comment: occasional  . Drug use: No  . Sexual activity: Not Currently  Other Topics Concern  . Not on file  Social History Narrative   Single but has a girlfriend.    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       Patient has medications disability post stroke.            Social Determinants of Health   Financial Resource Strain: Low Risk   . Difficulty of Paying Living Expenses: Not hard at all  Food Insecurity: No Food Insecurity  . Worried About Charity fundraiser in the Last Year: Never true  . Ran Out of Food in the Last Year: Never true  Transportation Needs: No Transportation Needs  . Lack of Transportation (Medical): No  . Lack of Transportation (Non-Medical): No  Physical Activity: Inactive  . Days of Exercise per Week: 0 days  . Minutes of Exercise per Session: 0 min  Stress: No Stress Concern Present  . Feeling of Stress : Only a little  Social Connections: Unknown  . Frequency of Communication with Friends and Family: More than three times a week  . Frequency of Social Gatherings with Friends and Family: Once a week  . Attends Religious Services: Never  . Active Member of Clubs or Organizations: No  . Attends Archivist Meetings: Never   . Marital Status: Not on file  Intimate Partner Violence:   .  Fear of Current or Ex-Partner: Not on file  . Emotionally Abused: Not on file  . Physically Abused: Not on file  . Sexually Abused: Not on file    Family History  Problem Relation Age of Onset  . Hypertension Mother   . Diabetes Mother   . Alcohol abuse Father     Review of Systems:  As stated in the HPI and otherwise negative.   BP 102/64   Pulse 86   Ht 5\' 7"  (1.702 m)   Wt 214 lb (97.1 kg)   SpO2 97%   BMI 33.52 kg/m   Physical Examination:  General: Well developed, well nourished, NAD  HEENT: OP clear, mucus membranes moist  SKIN: warm, dry. No rashes. Neuro: No focal deficits  Musculoskeletal: Muscle strength 5/5 all ext  Psychiatric: Mood and affect normal  Neck: No JVD, no carotid bruits, no thyromegaly, no lymphadenopathy.  Lungs:Clear bilaterally, no wheezes, rhonci, crackles Cardiovascular: Regular rate and rhythm. No murmurs, gallops or rubs. Abdomen:Soft. Bowel sounds present. Non-tender.  Extremities: No lower extremity edema. Pulses are 2 + in the bilateral DP/PT.  Echo 04/11/19: 1. Left ventricular ejection fraction, by visual estimation, is 50 to  55%. The left ventricle has low normal function. There is severely  increased left ventricular hypertrophy.  2. Left ventricular diastolic parameters are consistent with Grade I  diastolic dysfunction (impaired relaxation).  3. The left ventricle has no regional wall motion abnormalities.  4. Global right ventricle has normal systolic function.The right  ventricular size is normal. No increase in right ventricular wall  thickness.  5. Left atrial size was mildly dilated.  6. Right atrial size was normal.  7. The mitral valve is degenerative. Trivial mitral valve regurgitation.  8. The tricuspid valve is normal in structure.  9. The tricuspid valve is normal in structure. Tricuspid valve  regurgitation is trivial.  10. Aortic valve  regurgitation is not visualized. Mild aortic valve  stenosis.  11. The pulmonic valve was grossly normal. Pulmonic valve regurgitation is  not visualized.  12. The aortic arch was not well visualized.  13. Low normal LVEF without focal WMA. Severe asymmetric basal septal  hypertrophy on parasternal views, though less impressive in alternate  windows. Normal functioning bioprosthetic AVR. E/e' suggests normal LVEDP.  14. The atrial septum is grossly normal.   EKG:  EKG is not ordered today. The ekg ordered today demonstrates   Recent Labs: 07/02/2018: ALT 18 02/20/2019: BUN 18; Creatinine, Ser 1.16; Hemoglobin 12.4; Platelets 211; Potassium 4.0; Sodium 137   Lipid Panel    Component Value Date/Time   CHOL 274 (H) 06/29/2018 0420   CHOL 323 (H) 01/18/2018 0900   TRIG 242 (H) 06/29/2018 0420   HDL 45 06/29/2018 0420   HDL 52 01/18/2018 0900   CHOLHDL 6.1 06/29/2018 0420   VLDL 48 (H) 06/29/2018 0420   LDLCALC 181 (H) 06/29/2018 0420   LDLCALC 223 (H) 01/18/2018 0900     Wt Readings from Last 3 Encounters:  04/14/19 214 lb (97.1 kg)  03/10/19 213 lb 6.4 oz (96.8 kg)  02/27/19 213 lb 9.6 oz (96.9 kg)     Other studies Reviewed: Additional studies/ records that were reviewed today include: . Review of the above records demonstrates:   Assessment and Plan:   1. CAD without angina: He has no chest pain. Recent PCI/stenting of the mid LAD and SVG to PDA in an outside hospital October 20,2020. Will continue Plavix. He is not on an ASA since  he is on Eliquis.   He is not sure why he is no longer on a beta blocker.  I have explained to him that we typically continue with Plavix for 12 months following stent placement but with newer generation drug eluting stents, it is reasonable to consider holding after 3 months for an urgent surgical procedure.   2. Pericardial effusion: Resolved s/p pericardial window.   3. Aortic stenosis: s/p surgical AVR with bioprosthetic AVR. His  bioprosthetic valve is working well by echo in January 2021.    4. Atrial fib, paroxysmal: He is in sinus today. He had a stroke in April 2020 and another stroke remotely in 2007. He has been taking his Eliquis only once daily. We will resume Eliquis 5 mg po BID. He is not sure if he is taking the beta blocker.    5. HTN: BP is well controlled  6. Hyperlipidemia: Will restart Zetia. He states that he cannot tolerate statins. He had been on Repatha that was started on [place]. He no longer has any refills for this. I will set up an appointment in the lipid clinic to discuss [place]. LDL in April 2020 was not at goal. He will need repeat lipids.    7. Chronic diastolic CHF: Weight is stable. No volume overload on exam. Will continue Lasix.   8. Pre-operative cardiovascular examination: His cardiac issues are stable. He will need a retinal procedure in the near future. From a risk perspective, he can proceed with his procedure. I have told him that it would be reasonable to hold his Plavix for this procedure as he is now 3 months out from stenting with the newer generation of drug eluting stents. I would resume Plavix after the surgical procedure. In regards to Eliquis, there is a risk of thromboembolic events if this is held, especially given his history of strokes. He wishes to proceed with the surgical procedure as the only thing he enjoys in life is painting and he has been unable to do this with his current vision issues. He would need to hold his Plavix for 5 days and Eliquis for 2 days before the surgical procedure if it is required to be held by his surgeon.   Current medicines are reviewed at length with the patient today.  The patient does not have concerns regarding medicines.  The following changes have been made:  no change  Labs/ tests ordered today include:   Orders Placed This Encounter  Procedures  . AMB Referral to [facility]   Disposition:   FU with me in 12  months   Signed, Lauree Chandler, MD 04/14/2019 6:[address], [place], [place]  [address] Phone: [phone]; Fax: [phone]

## 2019-04-15 ENCOUNTER — Telehealth: Payer: Self-pay | Admitting: Cardiovascular Disease

## 2019-04-15 NOTE — Telephone Encounter (Signed)
Message to medical records.

## 2019-04-15 NOTE — Telephone Encounter (Signed)
Patient states that he is following up in regards to a release form that he is requesting to have sent over to Dr. Baird Cancer with [facility]. Please call to confirm.

## 2019-04-16 DIAGNOSIS — F331 Major depressive disorder, recurrent, moderate: Secondary | ICD-10-CM | POA: Diagnosis not present

## 2019-04-16 DIAGNOSIS — F064 Anxiety disorder due to known physiological condition: Secondary | ICD-10-CM | POA: Diagnosis not present

## 2019-04-22 NOTE — Telephone Encounter (Signed)
I spoke to the patient twice and then spoke with [facility], Dr. Baird Cancer office.  They confirmed the patient is scheduled for surgery on 05/01/19.  Will fax last ov note to [phone].  The patient wanted to know how many days to hold plavix/eliquis.   I adv him to contact Dr. Baird Cancer office for this information.

## 2019-04-30 ENCOUNTER — Telehealth: Payer: Self-pay | Admitting: Pharmacist

## 2019-04-30 NOTE — Telephone Encounter (Signed)
Called patient and left VM that clinic will be closed tomorrow. Offered to do virtual visit tomorrow vs rescheduling for another day.

## 2019-05-01 ENCOUNTER — Ambulatory Visit: Payer: Medicare Other

## 2019-05-05 DIAGNOSIS — E113212 Type 2 diabetes mellitus with mild nonproliferative diabetic retinopathy with macular edema, left eye: Secondary | ICD-10-CM | POA: Diagnosis not present

## 2019-05-05 DIAGNOSIS — H35372 Puckering of macula, left eye: Secondary | ICD-10-CM | POA: Diagnosis not present

## 2019-05-05 DIAGNOSIS — H26492 Other secondary cataract, left eye: Secondary | ICD-10-CM | POA: Diagnosis not present

## 2019-05-05 DIAGNOSIS — E113312 Type 2 diabetes mellitus with moderate nonproliferative diabetic retinopathy with macular edema, left eye: Secondary | ICD-10-CM | POA: Diagnosis not present

## 2019-05-06 ENCOUNTER — Other Ambulatory Visit: Payer: Self-pay | Admitting: Internal Medicine

## 2019-05-06 DIAGNOSIS — Z794 Long term (current) use of insulin: Secondary | ICD-10-CM

## 2019-05-06 DIAGNOSIS — E1140 Type 2 diabetes mellitus with diabetic neuropathy, unspecified: Secondary | ICD-10-CM

## 2019-05-06 DIAGNOSIS — E1142 Type 2 diabetes mellitus with diabetic polyneuropathy: Secondary | ICD-10-CM

## 2019-05-06 NOTE — Telephone Encounter (Signed)
Pt stated he was recently was discharged from [facility]; [facility] is closer to where he's living now.

## 2019-05-06 NOTE — Telephone Encounter (Signed)
Pt requesting all medications be transferred  To his new Pharmacy. Pt states he has already contacted the pharmacy listed below and thye are requesting a list of his medications.   acetaminophen (TYLENOL) 325 MG tablet    apixaban (ELIQUIS) 5 MG TABS tablet    BAYER MICROLET LANCETS lancets    clopidogrel (PLAVIX) 75 MG tablet    Evolocumab 140 MG/ML SOAJ    ezetimibe (ZETIA) 10 MG tablet    furosemide (LASIX) 40 MG tablet    glucose blood (CONTOUR NEXT TEST) test strip    insulin aspart (NOVOLOG) 100 UNIT/ML FlexPen    Insulin Glargine-Lixisenatide (SOLIQUA) 100-33 UNT-MCG/ML SOPN    Insulin Pen Needle 31G X 5 MM MISC    lisinopril (ZESTRIL) 10 MG tablet    metFORMIN (GLUCOPHAGE) 500 MG tablet    neomycin-polymyxin-hydrocortisone (CORTISPORIN) 3.5-10000-1 OTIC suspension    nitroGLYCERIN (NITROSTAT) 0.4 MG SL tablet    [facility] [address] [place], [place] [address]  Phone: [phone]

## 2019-05-08 MED ORDER — NITROGLYCERIN 0.4 MG SL SUBL: SUBLINGUAL_TABLET | SUBLINGUAL | 0 refills | Status: DC

## 2019-05-08 MED ORDER — METFORMIN HCL 500 MG PO TABS: 500.0000 mg | ORAL_TABLET | Freq: Two times a day (BID) | ORAL | 0 refills | Status: DC

## 2019-05-08 MED ORDER — CLOPIDOGREL BISULFATE 75 MG PO TABS: 75.0000 mg | ORAL_TABLET | Freq: Every day | ORAL | 0 refills | Status: DC

## 2019-05-08 MED ORDER — EZETIMIBE 10 MG PO TABS: 10.0000 mg | ORAL_TABLET | Freq: Every day | ORAL | 0 refills | Status: DC

## 2019-05-08 MED ORDER — CONTOUR NEXT TEST VI STRP: ORAL_STRIP | 12 refills | Status: DC

## 2019-05-08 MED ORDER — BAYER MICROLET LANCETS MISC: 12 refills | Status: DC

## 2019-05-08 MED ORDER — APIXABAN 5 MG PO TABS: 5.0000 mg | ORAL_TABLET | Freq: Two times a day (BID) | ORAL | 0 refills | Status: DC

## 2019-05-08 MED ORDER — SOLIQUA 100-33 UNT-MCG/ML ~~LOC~~ SOPN: 40.0000 [IU] | PEN_INJECTOR | SUBCUTANEOUS | 1 refills | Status: DC

## 2019-05-08 MED ORDER — NEOMYCIN-POLYMYXIN-HC 3.5-10000-1 OT SUSP: 3.0000 [drp] | Freq: Four times a day (QID) | OTIC | 0 refills | Status: DC

## 2019-05-08 MED ORDER — INSULIN PEN NEEDLE 31G X 5 MM MISC: 3 refills | Status: DC

## 2019-05-08 MED ORDER — INSULIN ASPART 100 UNIT/ML FLEXPEN: 18.0000 [IU] | PEN_INJECTOR | Freq: Three times a day (TID) | SUBCUTANEOUS | 1 refills | Status: DC

## 2019-05-08 MED ORDER — EVOLOCUMAB 140 MG/ML ~~LOC~~ SOAJ: 1.0000 mL | SUBCUTANEOUS | 12 refills | Status: DC

## 2019-05-08 MED ORDER — LISINOPRIL 10 MG PO TABS: 10.0000 mg | ORAL_TABLET | Freq: Every day | ORAL | 0 refills | Status: DC

## 2019-05-08 MED ORDER — FUROSEMIDE 40 MG PO TABS: 40.0000 mg | ORAL_TABLET | Freq: Every day | ORAL | 0 refills | Status: DC

## 2019-05-09 NOTE — Telephone Encounter (Signed)
Appt scheduled March 2.

## 2019-05-13 ENCOUNTER — Other Ambulatory Visit: Payer: Self-pay

## 2019-05-13 ENCOUNTER — Encounter: Payer: Self-pay | Admitting: Internal Medicine

## 2019-05-13 ENCOUNTER — Ambulatory Visit (INDEPENDENT_AMBULATORY_CARE_PROVIDER_SITE_OTHER): Payer: Medicare Other | Admitting: Internal Medicine

## 2019-05-13 VITALS — BP 131/71 | HR 82 | Temp 98.1°F | Ht 68.0 in | Wt 217.1 lb

## 2019-05-13 DIAGNOSIS — M25512 Pain in left shoulder: Secondary | ICD-10-CM

## 2019-05-13 DIAGNOSIS — F321 Major depressive disorder, single episode, moderate: Secondary | ICD-10-CM | POA: Diagnosis not present

## 2019-05-13 DIAGNOSIS — Z794 Long term (current) use of insulin: Secondary | ICD-10-CM

## 2019-05-13 DIAGNOSIS — E1140 Type 2 diabetes mellitus with diabetic neuropathy, unspecified: Secondary | ICD-10-CM

## 2019-05-13 DIAGNOSIS — E785 Hyperlipidemia, unspecified: Secondary | ICD-10-CM | POA: Diagnosis not present

## 2019-05-13 DIAGNOSIS — I2510 Atherosclerotic heart disease of native coronary artery without angina pectoris: Secondary | ICD-10-CM

## 2019-05-13 DIAGNOSIS — G8929 Other chronic pain: Secondary | ICD-10-CM | POA: Diagnosis not present

## 2019-05-13 LAB — POCT GLYCOSYLATED HEMOGLOBIN (HGB A1C): Hemoglobin A1C: 6.8 % — AB (ref 4.0–5.6)

## 2019-05-13 LAB — GLUCOSE, CAPILLARY: Glucose-Capillary: 123 mg/dL — ABNORMAL HIGH (ref 70–99)

## 2019-05-13 NOTE — Progress Notes (Signed)
   CC: follow-up of hyperlipidemia and diabetes  HPI:  [name] is a 69 y.o. M with significant PMH as outlined below who presents for follow-up of his diabetes and hyperlipidemia. Please see problem-based charting below for follow-up management of the pt's chronic medical conditions.  Past Medical History:  Diagnosis Date  . Acute diastolic congestive heart failure ([place])   . Aortic valve stenosis s/p AVR 2018  . Atrial fibrillation ([place]) - post-op CABG    04/2016 CHA2DS2VAS score = 5  . Atypical nevi   . Coronary artery disease s/p 2 vessel CABG   . Depression    "years ago"  . Diabetes mellitus   . Dyspnea    in the past   . GERD (gastroesophageal reflux disease)   . Heart murmur   . Hepatic cirrhosis ([place]) 06/29/2017  . History of kidney stones   . Hyperlipidemia    hx of transaminitis secondary to statin and he has decided not to use statins secondary to potential side effects.  . Hypertension   . Nephrolithiasis   . Osteoarthritis, knee   . Sleep apnea     Central apnea. Not using cpap  . Stroke [facility]) 2007  . Transaminitis     Statin-induced  . 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 2-D echo was normal no embolic source found.   Social history - Pt recently left an assisted living facility, where he had stayed for 3-4 weeks. Pt states he has been renting a room from a friend here in [place].  Review of Systems:   Review of Systems  Constitutional: Negative for chills and fever.  Respiratory: Negative for shortness of breath.   Cardiovascular: Negative for chest pain, palpitations and leg swelling.  Musculoskeletal: Positive for joint pain (L shoulder).  Neurological: Negative  for focal weakness, weakness and headaches.  Psychiatric/Behavioral: Negative for depression. The patient is not nervous/anxious.    Physical Exam:  Vitals:   05/13/19 1604  BP: 131/71  Pulse: 82  Temp: 98.1 F (36.7 C)  TempSrc: Oral  SpO2: 100%  Weight: 217 lb 1.6 oz (98.5 kg)  Height: 5\' 8"  (1.727 m)   Physical Exam Vitals and nursing note reviewed.  Constitutional:      General: He is not in acute distress.    Appearance: He is not ill-appearing.  HENT:     Nose: Nose normal. No congestion or rhinorrhea.     Mouth/Throat:     Mouth: Mucous membranes are moist.     Comments: No evidence of gum bleeding. Pulmonary:     Effort: Pulmonary effort is normal. No respiratory distress.  Musculoskeletal:     Comments: Limited ROM in L shoulder. No tenderness to palpation of the joint, deltoid, trapezius, or biceps muscles. Pain with internal and external rotation of shoulder.   Neurological:     Mental Status: He is alert.     Comments: Residual left sided sensory deficits from prior stroke.   Psychiatric:        Mood and Affect: Mood normal.    Assessment & Plan:   See Encounters Tab for problem based charting.  Patient discussed with Dr. Daryll Drown

## 2019-05-13 NOTE — Patient Instructions (Addendum)
Mr. Harclerode,  It was nice meeting you today! I am glad you are doing well and feeling well this afternoon!  For your high cholesterol - We will repeat blood work today and you can follow-up in the lipid clinic to resume the Repatha medication.  For your diabetes - Your A1c is under great control! We will make no medication changes today, so continue taking Soliqua 40 units daily, Novolog 18 units with meals, and metformin 500mg  twice a day.  For your shoulder pain - I will place a referral to orthopedic surgery

## 2019-05-14 DIAGNOSIS — H3581 Retinal edema: Secondary | ICD-10-CM | POA: Diagnosis not present

## 2019-05-14 DIAGNOSIS — F331 Major depressive disorder, recurrent, moderate: Secondary | ICD-10-CM | POA: Diagnosis not present

## 2019-05-14 DIAGNOSIS — H35372 Puckering of macula, left eye: Secondary | ICD-10-CM | POA: Diagnosis not present

## 2019-05-14 DIAGNOSIS — F064 Anxiety disorder due to known physiological condition: Secondary | ICD-10-CM | POA: Diagnosis not present

## 2019-05-14 LAB — LIPID PANEL
Chol/HDL Ratio: 4.7 ratio (ref 0.0–5.0)
Cholesterol, Total: 229 mg/dL — ABNORMAL HIGH (ref 100–199)
HDL: 49 mg/dL (ref >39)
LDL Chol Calc (NIH): 126 mg/dL — ABNORMAL HIGH (ref 0–99)
Triglycerides: 306 mg/dL — ABNORMAL HIGH (ref 0–149)
VLDL Cholesterol Cal: 54 mg/dL — ABNORMAL HIGH (ref 5–40)

## 2019-05-19 NOTE — Progress Notes (Signed)
Patient ID: Mark Cabrera                 DOB: [birth year]                    MRN: [id number]     HPI: Mark Cabrera is a 69 y.o. male patient referred to lipid clinic by Dr. [name]. PMH is significant for atrial fibrillation, DM, severe aortic stenosis now s/p AVR, CAD s/p 2V CABG Jan 2018, prior CVA (2007, April 2020), severe LVH, DESx2 in mid LAD and body of SVG to PDA.    Patient's last office visit in Feb 2021 stated patient had been on [place] in [place] and patient confirmed this was started in November 2020. Patient has now run out of refills and had his last shot roughly 1 week prior to his most recent lipid panel. Patient was also restarted on Zetia 10mg  daily at this visit in February and patient confirmed he was taking at the time of the lipid panel. Lipid panel 05/13/19 TG 306, HDL 49, LDL 126 (lipid panel 01/2018 TG 242, HDL 52, LDL 223).   Patient was originally scheduled to come in person to clinic but no showed and was contacted via phone for follow up of his hyperlipidemia. Mark Cabrera endorses a transient lifestyle commonly having to stay with different friends in different areas but tries to eat a heart healthy diet as much as he can. His exercise is limited by his balance and shortness of breath. He has been on numerous statins in the past but experienced back aches with all of them. He has tolerated Zetia and Repatha well. Patient was on gemfibrozil years ago but is not currently on any triglyceride lowering therapies.   Per his pharmacy, he has [place] (ID [id number], [place] [id number], PCN [phone], Group [id number]) as well as Medicaid.   Current Medications: Zetia 10mg  daily, Repatha 140mg  SQ every 2 weeks  Intolerances: atorvastatin 40mg , 80mg  (back ache), lovastatin 20mg  (back ache), rosuvastatin 10mg  (back ache) Risk Factors: CAD, CABG, CVA (x2), DM LDL goal: 55mg /dL  Diet: breakfast (cereal, milk, oatmeal, eggs), lunch (usually don't eat, sometimes a sandwich), snack on  dried fruit & nuts, dinner (fish, greens, salads)  Exercise: walking some, difficult due to balance and shortness of breath  Family History: hypertension, diabetes (mother)  Social History: never smoked, denies drug use, drinks 1 can of beer/week  Labs:  05/13/19: TG 306, HDL 49, LDL 126 (on Zetia 10mg  daily, Repatha 140mg  SQ every two weeks) 11/19: TG 242, HDL 52, LDL 223 (baseline)  Past Medical History:  Diagnosis Date  . Acute diastolic congestive heart failure ([place])   . Aortic valve stenosis s/p AVR 2018  . Atrial fibrillation ([place]) - post-op CABG    04/2016 CHA2DS2VAS score = 5  . Atypical nevi   . Coronary artery disease s/p 2 vessel CABG   . Depression    "years ago"  . Diabetes mellitus   . Dyspnea    in the past   . GERD (gastroesophageal reflux disease)   . Heart murmur   . Hepatic cirrhosis ([place]) 06/29/2017  . History of kidney stones   . Hyperlipidemia    hx of transaminitis secondary to statin and he has decided not to use statins secondary to potential side effects.  . Hypertension   . Nephrolithiasis   . Osteoarthritis, knee   . Sleep apnea     Central apnea. Not using cpap  .  Stroke [facility]) 2007  . Transaminitis     Statin-induced  . 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 2-D echo was normal no embolic source found.    Current Outpatient Medications on File Prior to Visit  Medication Sig Dispense Refill  . acetaminophen (TYLENOL) 325 MG tablet Take 2 tablets (650 mg total) by mouth every 4 (four) hours as needed for headache or mild pain.    Marland Kitchen apixaban (ELIQUIS) 5 MG TABS tablet Take 1 tablet (5 mg total) by mouth 2 (two) times daily. 180 tablet 0  . Bayer Microlet Lancets  lancets Check blood sugar 3 times a day as instructed 100 each 12  . clopidogrel (PLAVIX) 75 MG tablet Take 1 tablet (75 mg total) by mouth daily. 90 tablet 0  . Evolocumab 140 MG/ML SOAJ Inject 1 mL into the skin every 14 (fourteen) days. 1 pen 12  . ezetimibe (ZETIA) 10 MG tablet Take 1 tablet (10 mg total) by mouth daily. 90 tablet 0  . furosemide (LASIX) 40 MG tablet Take 1 tablet (40 mg total) by mouth daily. 90 tablet 0  . glucose blood (CONTOUR NEXT TEST) test strip Check blood sugar 3 times a day as instructed 100 each 12  . insulin aspart (NOVOLOG) 100 UNIT/ML FlexPen Inject 18 Units into the skin 3 (three) times daily with meals. 15 mL 1  . Insulin Glargine-Lixisenatide (SOLIQUA) 100-33 UNT-MCG/ML SOPN Inject 40 Units into the skin every morning. 15 mL 1  . Insulin Pen Needle 31G X 5 MM MISC Use one pen needle three times daily. Dx E11.49 100 each 3  . lisinopril (ZESTRIL) 10 MG tablet Take 1 tablet (10 mg total) by mouth daily. 90 tablet 0  . metFORMIN (GLUCOPHAGE) 500 MG tablet Take 1 tablet (500 mg total) by mouth 2 (two) times daily with a meal. 180 tablet 0  . neomycin-polymyxin-hydrocortisone (CORTISPORIN) 3.5-10000-1 OTIC suspension Place 3 drops into the left ear 4 (four) times daily. 10 mL 0  . nitroGLYCERIN (NITROSTAT) 0.4 MG SL tablet PLACE 1 TABLET UNDER THE TONGUE EVERY 5 MINUTES AS NEEDED FOR CHEST PAIN 25 tablet 0   No current facility-administered medications on file prior to visit.    Allergies  Allergen Reactions  . Penicillins Other (See Comments)    UNSPECIFIED REACTIONS  Has patient had a PCN reaction causing immediate rash, facial/tongue/throat swelling, SOB or lightheadedness with hypotension: Unk Has patient had a PCN reaction causing severe rash involving mucus membranes or skin necrosis: Unk Has patient had a PCN reaction that required hospitalization: Unk Has patient had a PCN reaction occurring within the last 10 years: No If all of the above answers  are "NO", then may proceed with Cephalosporin use.   . Statins Other (See Comments)    Severe rash and back pain  . Morphine And Related Other (See Comments)    hallucinations  . Sglt2 Inhibitors Rash    Candidiasis infection prone    Assessment/Plan:  1. Hyperlipidemia - Based on LDL goal of <55, patient is not currently at his LDL goal on Repatha and Zetia. Patient is agreeable to continuing his Repatha (PA good through 09/02/19, copay $4) and a trial of Nexlizet to get his LDL closer to goal. Zetia  was well tolerated by the patient but will only lower his LDL by 20% while Nexlizet can lower his LDL by up to 40%. We will submit a PA for Nexlizet and follow up with patient regarding cost. Patient to follow up in 8 weeks for fasting lipid panel. Triglyceride lowering options were not discussed at this visit but Vascepa would be appropriate to discuss for this patient at his next visit.  Ladoris Gene, PharmD Candidate  Megan E. Supple, PharmD, BCACP, [place] [id number] [address], [place], [place] [address] Phone: [phone]; Fax: [phone] 05/20/2019 10:32 AM

## 2019-05-20 ENCOUNTER — Ambulatory Visit (INDEPENDENT_AMBULATORY_CARE_PROVIDER_SITE_OTHER): Payer: Medicare Other | Admitting: Pharmacist

## 2019-05-20 ENCOUNTER — Telehealth: Payer: Self-pay

## 2019-05-20 ENCOUNTER — Other Ambulatory Visit: Payer: Self-pay

## 2019-05-20 DIAGNOSIS — E785 Hyperlipidemia, unspecified: Secondary | ICD-10-CM

## 2019-05-20 MED ORDER — EVOLOCUMAB 140 MG/ML ~~LOC~~ SOAJ: 1.0000 "pen " | SUBCUTANEOUS | 12 refills | Status: DC

## 2019-05-20 NOTE — Assessment & Plan Note (Addendum)
Pt requesting referral to orthopedic surgery for rotation cuff repair. States he has had L shoulder pain for over 1 year now. Denies any initial injury or trauma. He had his R rotator cuff repaired in 2016 in [place] (where he was living at the time), but would like to establish with an orthopedic practice closer to where he is now in [place]. Had a shoulder x-ray done in 06/2018 during a hospital admission for a CVA, and that showed mild degenerative change to the L shoulder and AC joint and calcific tendinitis rotator cuff. On examination, pt has limited ROM to the L shoulder joint and pain with internal and external rotation.   Assessment - chronic L shoulder pain, ?possible tear of rotator cuff  - placed referral to orthopedic surgery

## 2019-05-20 NOTE — Assessment & Plan Note (Signed)
Pt requesting a repeat lipid panel today. Prior history of multiple CVAs. He was recently seen by cardiology on 2/1 and referred to the lipid clinic. Was unable to make that appointment secondary to winter weather, and pt uncertain of the reasoning for referral. Since that time, pt has been taking Zetia. Intolerant of statins. He has missed last two Repatha injections, because he ran out and was unsure if he should continue this medication that was started by a provider in [place].   Assessment - Dyslipidemia, not at goal  - LDL elevated to 126 today  - total cholesterol 229, triglycerides 306, and HDL 49 on non-fasting panel - emphasized to pt the importance of following at the lipid clinic and continuing Repatha medication - pt given number to call and re-schedule appointment with the lipid clinic for next week  Lipid Panel     Component Value Date/Time   CHOL 229 (H) 05/13/2019 1703   TRIG 306 (H) 05/13/2019 1703   HDL 49 05/13/2019 1703   CHOLHDL 4.7 05/13/2019 1703   CHOLHDL 6.1 06/29/2018 0420   VLDL 48 (H) 06/29/2018 0420   LDLCALC 126 (H) 05/13/2019 1703   LABVLDL 54 (H) 05/13/2019 1703

## 2019-05-20 NOTE — Assessment & Plan Note (Signed)
Pt brought in his medications today. Endorses taking Soliqua 40u daily, Novolog 18u TID with meals, and metformin 500mg  BID. Checks his blood sugars at home occasionally and endorses they range below 160-170. CBG today 123 with A1c 6.8. A1c is much improved from 9.3 three months ago. Denies polyuria, polydipsia, nausea/vomiting, fatigue, or headaches/confusion.  Assessment - well controlled type II diabetes  - instructed pt to continue current regimen as outlined above - will follow-up with me in 3 months to ensure good glycemic control when pt administering his own medications outside a facility  Lab Results  Component Value Date   HGBA1C 6.8 (A) 05/13/2019

## 2019-05-20 NOTE — Assessment & Plan Note (Signed)
Pt in good spirits today. States he has noted a drastic improvement in his mood and behavior since leaving his assisted living facility. Pt described feeling down, anxious, and trapped while there. He felt the other residents were hopeless and would not engage in social conversation or activities. He reiterates that painting and art are the only things in life that bring him joy, and the facility was unable to accommodate an area for him to even draw. Was started on Lexapro by a psychiatrist there, but is not interested in continuing an anti-depressant at this time as his mood as improved so much with a different living situation.   - will continue to monitor - pt has been seen by Dessie Coma of behavioral health in the past, instructed him that he can reestablish with her should he feel his mood change or depressed in the future.

## 2019-05-20 NOTE — Telephone Encounter (Signed)
PA denied for Nexlizet. Letter of appeals has been submitted.

## 2019-05-21 NOTE — Progress Notes (Signed)
Internal Medicine Clinic Attending  Case discussed with Dr. Jones at the time of the visit.  We reviewed the resident's history and exam and pertinent patient test results.  I agree with the assessment, diagnosis, and plan of care documented in the resident's note.  

## 2019-05-26 ENCOUNTER — Telehealth: Payer: Self-pay | Admitting: Pharmacist

## 2019-05-26 ENCOUNTER — Other Ambulatory Visit: Payer: Self-pay | Admitting: *Deleted

## 2019-05-26 DIAGNOSIS — E1142 Type 2 diabetes mellitus with diabetic polyneuropathy: Secondary | ICD-10-CM

## 2019-05-26 MED ORDER — CONTOUR NEXT TEST VI STRP: ORAL_STRIP | 12 refills | Status: DC

## 2019-05-26 MED ORDER — CLOPIDOGREL BISULFATE 75 MG PO TABS: 75.0000 mg | ORAL_TABLET | Freq: Every day | ORAL | 0 refills | Status: DC

## 2019-05-26 MED ORDER — LISINOPRIL 10 MG PO TABS: 10.0000 mg | ORAL_TABLET | Freq: Every day | ORAL | 0 refills | Status: DC

## 2019-05-26 MED ORDER — EVOLOCUMAB 140 MG/ML ~~LOC~~ SOAJ: 1.0000 "pen " | SUBCUTANEOUS | 12 refills | Status: DC

## 2019-05-26 MED ORDER — BAYER MICROLET LANCETS MISC: 12 refills | Status: DC

## 2019-05-26 NOTE — Telephone Encounter (Signed)
Refill for Repatha sent to [facility]  Also requesting lisinopril, metformin and clopidogel Will send metformin to PCP

## 2019-05-27 ENCOUNTER — Other Ambulatory Visit: Payer: Self-pay | Admitting: Internal Medicine

## 2019-05-27 DIAGNOSIS — E1140 Type 2 diabetes mellitus with diabetic neuropathy, unspecified: Secondary | ICD-10-CM

## 2019-05-27 MED ORDER — METFORMIN HCL 500 MG PO TABS: 500.0000 mg | ORAL_TABLET | Freq: Two times a day (BID) | ORAL | 1 refills | Status: DC

## 2019-05-27 NOTE — Progress Notes (Signed)
Metformin refills sent into [facility].

## 2019-05-29 ENCOUNTER — Telehealth: Payer: Self-pay

## 2019-05-29 MED ORDER — NEXLIZET 180-10 MG PO TABS: 1.0000 | ORAL_TABLET | Freq: Every day | ORAL | 3 refills | Status: DC

## 2019-05-29 NOTE — Telephone Encounter (Addendum)
Reached out to [facility] regarding status of Letter of Appeal for Nexlizet prior authorization. Patient has been approved through 03/12/2020.   Patient to continue Repatha and stop Zetia. We will send in Rx for Nexlizet and follow up with the patient for labs in 8 weeks. Copay for Nexlizet is $4 at [employer]. Per pharmacist, will have to order so will be ready in a few days.   Attempted calling the patient but had to leave a voicemail. Will try again if we do not hear back in a few days.

## 2019-06-02 NOTE — Telephone Encounter (Signed)
Left a second voicemail for patient to call back.

## 2019-06-04 ENCOUNTER — Ambulatory Visit: Payer: Medicare Other | Admitting: Orthopaedic Surgery

## 2019-06-04 DIAGNOSIS — H35372 Puckering of macula, left eye: Secondary | ICD-10-CM | POA: Diagnosis not present

## 2019-06-04 NOTE — Telephone Encounter (Signed)
Left third voicemail for patient to call back.

## 2019-06-09 DIAGNOSIS — F419 Anxiety disorder, unspecified: Secondary | ICD-10-CM | POA: Diagnosis not present

## 2019-06-09 DIAGNOSIS — F321 Major depressive disorder, single episode, moderate: Secondary | ICD-10-CM | POA: Diagnosis not present

## 2019-06-09 NOTE — Telephone Encounter (Signed)
Left voicemail on mobile phone listed in chart that belongs to daughter [name]) to have patient give [place] a call back.   Per pharmacy [facility]) patient has not picked up his prescription.

## 2019-06-12 NOTE — Telephone Encounter (Signed)
Called pt again and left voicemail. Called cell and spoke with his daughter. She reports pt has been having memory trouble. She will relay message that pt has a medication ready for pickup at [facility] in [place].

## 2019-06-17 ENCOUNTER — Encounter: Payer: Self-pay | Admitting: Orthopaedic Surgery

## 2019-06-17 ENCOUNTER — Ambulatory Visit (INDEPENDENT_AMBULATORY_CARE_PROVIDER_SITE_OTHER): Payer: Medicare Other | Admitting: Orthopaedic Surgery

## 2019-06-17 ENCOUNTER — Ambulatory Visit: Payer: Self-pay

## 2019-06-17 ENCOUNTER — Other Ambulatory Visit: Payer: Self-pay

## 2019-06-17 DIAGNOSIS — G8929 Other chronic pain: Secondary | ICD-10-CM | POA: Diagnosis not present

## 2019-06-17 DIAGNOSIS — I2510 Atherosclerotic heart disease of native coronary artery without angina pectoris: Secondary | ICD-10-CM

## 2019-06-17 DIAGNOSIS — M25512 Pain in left shoulder: Secondary | ICD-10-CM | POA: Diagnosis not present

## 2019-06-17 NOTE — Progress Notes (Signed)
Subjective: Patient is here for ultrasound-guided intra-articular left glenohumeral injection.  Pain from [facility] DJD.  Objective:  Decreased ROM overhead.  Procedure: Ultrasound-guided left glenohumeral injection: After sterile prep with Betadine, injected 8 cc 1% lidocaine without epinephrine and 40 mg methylprednisolone using a 22-gauge spinal needle, passing the needle through approach into the glenohumeral joint.  Injectate seen filling capsule.  Good immediate relief.

## 2019-06-17 NOTE — Addendum Note (Signed)
Addended by: Marlyne Beards on: 06/17/2019 02:59 PM   Modules accepted: Orders

## 2019-06-17 NOTE — Progress Notes (Signed)
Office Visit Note   Patient: Mark Cabrera           Date of Birth: March 12, 1951           MRN: [id number] Visit Date: 06/17/2019              Requested by: [name], MD [address] [place],  [place] [address] PCP: Ladona Horns, MD   Assessment & Plan: Visit Diagnoses:  1. Chronic left shoulder pain     Plan: Impression is left shoulder pain.  X-rays demonstrate mild glenohumeral OA.  Based on our discussion of treatment options patient would like to try a glenohumeral injection today.  We will get him set up for this with Dr. Junius Roads today.  Home exercises and bands provided as well.  Follow-up as needed.  Questions encouraged and answered.  Follow-Up Instructions: Return if symptoms worsen or fail to improve.   Orders:  No orders of the defined types were placed in this encounter.  No orders of the defined types were placed in this encounter.     Procedures: No procedures performed   Clinical Data: No additional findings.   Subjective: No chief complaint on file.   Mark Cabrera is a 69 year old gentleman comes in for evaluation of chronic left shoulder pain for years.  He has had 2 strokes since I saw him 3 years ago for knee DJD.  He has chronic numbness on the right side of his body.  He is having increased pain in left shoulder with movement.  He has had a right rotator cuff repair in the past to [place].   Review of Systems  Constitutional: Negative.   All other systems reviewed and are negative.    Objective: Vital Signs: There were no vitals taken for this visit.  Physical Exam Vitals and nursing note reviewed.  Constitutional:      Appearance: He is well-developed.  Pulmonary:     Effort: Pulmonary effort is normal.  Abdominal:     Palpations: Abdomen is soft.  Skin:    General: Skin is warm.  Neurological:     Mental Status: He is alert and oriented to person, place, and time.  Psychiatric:        Behavior: Behavior normal.         Thought Content: Thought content normal.        Judgment: Judgment normal.     Ortho Exam Left shoulder exam shows mild restriction in range of motion secondary to discomfort.  Rotator cuff grossly intact to manual muscle testing.  He has moderate pain with range of motion.  Negative Spurling's. Specialty Comments:  No specialty comments available.  Imaging: No results found.   PMFS History: Patient Active Problem List   Diagnosis Date Noted  . Chronic left shoulder pain 05/13/2019  . Depression 11/05/2018  . Blurry vision, bilateral 09/20/2018  . Rotator cuff syndrome, left   . Occipital cerebral infarction ([place]) 07/01/2018  . CVA (cerebral vascular accident) ([place]) 06/28/2018  . Hepatic cirrhosis ([place]) 06/29/2017  . Candida albicans infection 01/09/2017  . Healthcare maintenance 12/16/2016  . Lipoma of left upper extremity 09/12/2016  . Chronic knee pain 07/04/2016  . Decreased visual acuity 07/04/2016  . Peripheral neuropathic pain 05/12/2016  . Pericardial effusion a. subxiphoid pericardial window on 04/21/2016 04/28/2016  . Pleural effusion on left, s/p throacentesis 04/18/16 04/28/2016  . Chest pain   . Paroxysmal atrial fibrillation ([place]) 04/16/2016  . S/P AVR (23 mm Edwards magnum  perciardial valve) 03/31/2016  . 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  . CAD (coronary artery disease), native coronary artery   . Acute diastolic CHF (congestive heart failure) ([place])   . Sleep apnea 08/02/2009  . 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 Dyslipidemia 05/18/2006  . Hypertensive heart disease   . History of dissection of vertebral artery  ([place]) 08/02/2005   Past Medical History:  Diagnosis Date  . Acute diastolic congestive heart failure ([place])   . Aortic valve stenosis s/p AVR 2018  . Atrial fibrillation ([place]) - post-op CABG    04/2016 CHA2DS2VAS score = 5  . Atypical nevi   . Coronary artery disease s/p 2 vessel CABG   . Depression    "years ago"    . Diabetes mellitus   . Dyspnea    in the past   . GERD (gastroesophageal reflux disease)   . Heart murmur   . Hepatic cirrhosis ([place]) 06/29/2017  . History of kidney stones   . Hyperlipidemia    hx of transaminitis secondary to statin and he has decided not to use statins secondary to potential side effects.  . Hypertension   . Nephrolithiasis   . Osteoarthritis, knee   . Sleep apnea     Central apnea. Not using cpap  . Stroke [facility]) 2007  . Transaminitis     Statin-induced  . 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 2-D echo was normal no embolic source found.    Family History  Problem Relation Age of Onset  . Hypertension Mother   . Diabetes Mother   . Alcohol abuse Father     Past Surgical History:  Procedure Laterality Date  . AORTIC VALVE REPLACEMENT N/A 03/30/2016   Procedure: AORTIC VALVE REPLACEMENT (AVR);  Surgeon: [name], MD;  Location: [facility];  Service: Open Heart Surgery;  Laterality: N/A;  . CARDIAC CATHETERIZATION N/A 03/15/2016   Procedure: Right/Left Heart Cath and Coronary Angiography;  Surgeon: Burnell Blanks, MD;  Location: [facility] CV [facility];  Service: Cardiovascular;  Laterality: N/A;  . CLIPPING OF ATRIAL APPENDAGE N/A 03/30/2016   Procedure: CLIPPING OF LEFT ATRIAL APPENDAGE;  Surgeon: [name], MD;  Location: [facility];  Service: Open Heart Surgery;  Laterality: N/A;  . CORONARY ARTERY BYPASS GRAFT N/A 03/30/2016   Procedure: CORONARY ARTERY BYPASS GRAFTING (CABG) Times Two;  Surgeon: [name], MD;  Location: [facility];  Service: Open Heart Surgery;  Laterality: N/A;  . KNEE ARTHROSCOPY W/ PARTIAL MEDIAL MENISCECTOMY  05/12/2005   right,  performed by Dr. French Ana for torn medial meniscus.  Marland Kitchen ROTATOR CUFF REPAIR Right 2016  . STERNAL WIRES REMOVAL N/A 08/13/2017   Procedure: STERNAL WIRES REMOVAL;  Surgeon: [name], MD;  Location: [facility];  Service: Thoracic;  Laterality: N/A;  . SUBXYPHOID PERICARDIAL WINDOW N/A 04/21/2016   Procedure: SUBXYPHOID PERICARDIAL WINDOW;  Surgeon: [name], MD;  Location: [facility];  Service: Thoracic;  Laterality: N/A;  . TEE WITHOUT CARDIOVERSION N/A 03/30/2016   Procedure: TRANSESOPHAGEAL ECHOCARDIOGRAM (TEE);  Surgeon: [name], MD;  Location: [facility];  Service: Open Heart Surgery;  Laterality: N/A;  . TEE WITHOUT CARDIOVERSION N/A 04/21/2016   Procedure: TRANSESOPHAGEAL ECHOCARDIOGRAM (TEE);  Surgeon: [name], MD;  Location: [facility];  Service: Thoracic;  Laterality: N/A;   Social History   Occupational History  . Occupation:  Medicaid [place]    Employer: UNEMPLOYED    Comment: following stroke in 2007  Tobacco Use  . Smoking status: Never Smoker  . Smokeless tobacco: Never Used  Substance and Sexual Activity  . Alcohol use: Yes    Alcohol/week: 1.0 standard drinks    Types: 1 Cans of beer per week    Comment: occasional  . Drug use: No  . Sexual activity: Not Currently

## 2019-07-02 ENCOUNTER — Encounter: Payer: Self-pay | Admitting: *Deleted

## 2019-07-03 ENCOUNTER — Encounter: Payer: Self-pay | Admitting: *Deleted

## 2019-07-03 NOTE — Progress Notes (Signed)

## 2019-07-07 DIAGNOSIS — H524 Presbyopia: Secondary | ICD-10-CM | POA: Diagnosis not present

## 2019-07-07 DIAGNOSIS — Z961 Presence of intraocular lens: Secondary | ICD-10-CM | POA: Diagnosis not present

## 2019-07-07 DIAGNOSIS — H5213 Myopia, bilateral: Secondary | ICD-10-CM | POA: Diagnosis not present

## 2019-07-07 DIAGNOSIS — H52203 Unspecified astigmatism, bilateral: Secondary | ICD-10-CM | POA: Diagnosis not present

## 2019-07-07 DIAGNOSIS — H26493 Other secondary cataract, bilateral: Secondary | ICD-10-CM | POA: Diagnosis not present

## 2019-07-07 LAB — HM DIABETES EYE EXAM

## 2019-07-08 NOTE — Progress Notes (Signed)
Things That May Be Affecting Your Health:  Alcohol  Hearing loss  Pain    Depression  Home Safety  Sexual Health   Diabetes  Lack of physical activity  Stress  X Difficulty with daily activities (painting)  Loneliness  Tiredness   Drug use X Medicines  Tobacco use   Falls  Motor Vehicle Safety  Weight   Food choices  Oral Health  Other    YOUR PERSONALIZED HEALTH PLAN : 1. Schedule your next subsequent Medicare Wellness visit in one year 2. Attend all of your regular appointments to address your medical issues 3. Complete the preventative screenings and services   Annual Wellness Visit   Medicare Covered Preventative Screenings and [place] Men and Women Who How Often Need? Date of Last Service Action  Abdominal Aortic Aneurysm Adults with AAA risk factors Once     Alcohol Misuse and Counseling All Adults Screening once a year if no alcohol misuse. Counseling up to 4 face to face sessions.     Bone Density Measurement  Adults at risk for osteoporosis Once every 2 yrs     Lipid Panel Z13.6 All adults without CV disease Once every 5 yrs     Colorectal Cancer   Stool sample or  Colonoscopy All adults 75 and older   Once every year  Every 10 years X    Depression All Adults Once a year  Today   Diabetes Screening Blood glucose, post glucose load, or GTT Z13.1  All adults at risk  Pre-diabetics  Once per year  Twice per year     Diabetes  Self-Management Training All adults Diabetics 10 hrs first year; 2 hours subsequent years. Requires Copay X    Glaucoma  Diabetics  Family history of glaucoma  African Americans 98 yrs +  Hispanic Americans 28 yrs + Annually - requires coppay     Hepatitis C Z72.89 or F19.20  High Risk for HCV  Born between 1945 and 1965  Annually  Once     HIV Z11.4 All adults based on risk  Annually btw ages 74 & 37 regardless of risk  Annually > 65 yrs if at increased risk     Lung Cancer Screening Asymptomatic  adults aged 23-77 with 30 pack yr history and current smoker OR quit within the last 15 yrs Annually Must have counseling and shared decision making documentation before first screen     Medical Nutrition Therapy Adults with   Diabetes  Renal disease  Kidney transplant within past 3 yrs 3 hours first year; 2 hours subsequent years     Obesity and Counseling All adults Screening once a year Counseling if BMI 30 or higher  Today   Tobacco Use Counseling Adults who use tobacco  Up to 8 visits in one year     Vaccines Z23  Hepatitis B  Influenza   Pneumonia  Adults   Once  Once every flu season  Two different vaccines separated by one year     Next Annual Wellness Visit People with Medicare Every year  Today     Services & Screenings Women Who How Often Need  Date of Last Service Action  Mammogram  Z12.31 Women over 91 One baseline ages 96-39. Annually ager 40 yrs+     Pap tests All women Annually if high risk. Every 2 yrs for normal risk women     Screening for cervical cancer with   Pap (Z01.419 nl or Z01.411abnl) &  HPV Z11.51 Women aged 51 to 43 Once every 5 yrs     Screening pelvic and breast exams All women Annually if high risk. Every 2 yrs for normal risk women     Sexually Transmitted Diseases  Chlamydia  Gonorrhea  Syphilis All at risk adults Annually for non pregnant females at increased risk         [place] Men Who How Ofter Need  Date of Last Service Action  Prostate Cancer - DRE & PSA Men over 50 Annually.  DRE might require a copay.     Sexually Transmitted Diseases  Syphilis All at risk adults Annually for men at increased risk

## 2019-07-16 DIAGNOSIS — H3342 Traction detachment of retina, left eye: Secondary | ICD-10-CM | POA: Diagnosis not present

## 2019-07-16 DIAGNOSIS — H33022 Retinal detachment with multiple breaks, left eye: Secondary | ICD-10-CM | POA: Diagnosis not present

## 2019-07-21 DIAGNOSIS — H3522 Other non-diabetic proliferative retinopathy, left eye: Secondary | ICD-10-CM | POA: Diagnosis not present

## 2019-07-21 DIAGNOSIS — H33022 Retinal detachment with multiple breaks, left eye: Secondary | ICD-10-CM | POA: Diagnosis not present

## 2019-07-22 DIAGNOSIS — H33022 Retinal detachment with multiple breaks, left eye: Secondary | ICD-10-CM | POA: Diagnosis not present

## 2019-07-22 DIAGNOSIS — H35372 Puckering of macula, left eye: Secondary | ICD-10-CM | POA: Diagnosis not present

## 2019-08-01 DIAGNOSIS — E113292 Type 2 diabetes mellitus with mild nonproliferative diabetic retinopathy without macular edema, left eye: Secondary | ICD-10-CM | POA: Diagnosis not present

## 2019-08-01 DIAGNOSIS — H33022 Retinal detachment with multiple breaks, left eye: Secondary | ICD-10-CM | POA: Diagnosis not present

## 2019-08-03 ENCOUNTER — Other Ambulatory Visit: Payer: Self-pay | Admitting: Internal Medicine

## 2019-08-03 DIAGNOSIS — Z794 Long term (current) use of insulin: Secondary | ICD-10-CM

## 2019-08-06 ENCOUNTER — Other Ambulatory Visit: Payer: Self-pay | Admitting: *Deleted

## 2019-08-07 ENCOUNTER — Other Ambulatory Visit: Payer: Self-pay

## 2019-08-07 ENCOUNTER — Ambulatory Visit (INDEPENDENT_AMBULATORY_CARE_PROVIDER_SITE_OTHER): Payer: Medicare Other | Admitting: Internal Medicine

## 2019-08-07 ENCOUNTER — Encounter: Payer: Self-pay | Admitting: Internal Medicine

## 2019-08-07 VITALS — BP 122/68 | HR 84 | Temp 97.6°F | Ht 68.0 in | Wt 217.6 lb

## 2019-08-07 DIAGNOSIS — E1140 Type 2 diabetes mellitus with diabetic neuropathy, unspecified: Secondary | ICD-10-CM | POA: Diagnosis not present

## 2019-08-07 DIAGNOSIS — I5032 Chronic diastolic (congestive) heart failure: Secondary | ICD-10-CM

## 2019-08-07 DIAGNOSIS — I2510 Atherosclerotic heart disease of native coronary artery without angina pectoris: Secondary | ICD-10-CM

## 2019-08-07 DIAGNOSIS — Z794 Long term (current) use of insulin: Secondary | ICD-10-CM | POA: Diagnosis not present

## 2019-08-07 DIAGNOSIS — N50812 Left testicular pain: Secondary | ICD-10-CM | POA: Insufficient documentation

## 2019-08-07 DIAGNOSIS — I5031 Acute diastolic (congestive) heart failure: Secondary | ICD-10-CM | POA: Diagnosis not present

## 2019-08-07 DIAGNOSIS — Z1211 Encounter for screening for malignant neoplasm of colon: Secondary | ICD-10-CM

## 2019-08-07 DIAGNOSIS — L720 Epidermal cyst: Secondary | ICD-10-CM

## 2019-08-07 NOTE — Assessment & Plan Note (Signed)
Presents w/ dermoid inclusion cyst of 3 month duration on his back. Mentions rubbing his back against a post due to pruritus with foul-smelling discharge. Continuing to endorse pruritus but denies significant tenderness. Physical exam shows two areas of desquamatization likely due to friction from aggressive scratching. Advised not to scratch if possible and use skin protectants, such as Vaseline. Also considering age and area of sun-exposure, may need biopsy if continues to persist and/or develop into high-risk features.

## 2019-08-07 NOTE — Assessment & Plan Note (Signed)
Prior Echo 50-55% w/ grade 1 diastolic dysfunction. He denies dyspnea, orthopnea, or chest pain. Mentions ambulating well outside when able. Currently on furosemide 40mg  daily. Appear euvolemic on exam today.  - BMP - C/w furosemide 40mg  daily

## 2019-08-07 NOTE — Assessment & Plan Note (Signed)
Discussed colon cancer screening. Mark Cabrera requests repeating FIT test  - FIT stool test

## 2019-08-07 NOTE — Assessment & Plan Note (Signed)
Presents for continued management of his diabetes. He mentions attempting to bring his glucometer today but appears to have lost it during transit. He will look for it when he returns home. Mentions cbgs ranging from 140-160s with higher cbg readings recently. He mentions taking 1 dose of prednisone from his friend. Currently taking Soliqua 40units, Novolog 18 units TID and metformin w/o significant side effects or hypoglycemic effets.   - Hemoglobin a1c - C/w soliquia 40 units daily, novolog 18 units TID, metformin

## 2019-08-07 NOTE — Progress Notes (Signed)
CC: Scrotal pain  HPI: [name] is a 69 y.o. with PMH listed below presenting with complaint of scrotal pain. Please see problem based assessment and plan for further details.  Past Medical History:  Diagnosis Date  . Acute diastolic congestive heart failure ([place])   . Aortic valve stenosis s/p AVR 2018  . Atrial fibrillation ([place]) - post-op CABG    04/2016 CHA2DS2VAS score = 5  . Atypical nevi   . Coronary artery disease s/p 2 vessel CABG   . Depression    "years ago"  . Diabetes mellitus   . Dyspnea    in the past   . GERD (gastroesophageal reflux disease)   . Heart murmur   . Hepatic cirrhosis ([place]) 06/29/2017  . History of kidney stones   . Hyperlipidemia    hx of transaminitis secondary to statin and he has decided not to use statins secondary to potential side effects.  . Hypertension   . Nephrolithiasis   . Osteoarthritis, knee   . Sleep apnea     Central apnea. Not using cpap  . Stroke [facility]) 2007  . Transaminitis     Statin-induced  . 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 2-D echo was normal no embolic source found.   Review of Systems: Review of Systems  Constitutional: Negative for chills, fever and malaise/fatigue.  Eyes: Negative for blurred vision.  Respiratory: Negative for cough and shortness of breath.   Cardiovascular: Negative for chest pain, palpitations and leg swelling.  Gastrointestinal: Negative for blood in stool, constipation, diarrhea, nausea and vomiting.  Genitourinary: Positive for frequency.  Musculoskeletal: Positive for back pain, joint pain and myalgias.  Neurological: Positive for tingling, weakness and headaches. Negative for dizziness.  All other  systems reviewed and are negative.   Physical Exam: Vitals:   08/07/19 0937  BP: 122/68  Pulse: 84  Temp: 97.6 F (36.4 C)  TempSrc: Oral  SpO2: 99%  Weight: 217 lb 9.6 oz (98.7 kg)  Height: 5\' 8"  (1.727 m)   Physical Exam  Constitutional: He appears well-developed and well-nourished. No distress.  HENT:  Mouth/Throat: Oropharynx is clear and moist.  Eyes: Conjunctivae are normal.  Cardiovascular: Normal rate, regular rhythm, normal heart sounds and intact distal pulses.  No murmur heard. Stenostomy scar  Respiratory: Effort normal and breath sounds normal. He has no wheezes. He has no rales.  GI: Soft. Bowel sounds are normal. He exhibits no distension. There is no abdominal tenderness.  Genitourinary:    Rectum normal.     Genitourinary Comments: Tenderness to palpation on L testicle without significant edema, erythema or warmth   Musculoskeletal:        General: Deformity (~2cm palpable, firm non tender, nonerythematous nodule on lateral aspect of LUE) present. No edema. Normal range of motion.     Cervical back: Normal range of motion.  Skin: Skin is warm and dry. There is erythema (Area of desquamatized region on mid back with yellow base).        Assessment & Plan:   Diabetes mellitus with neurological manifestation [facility]) Presents for continued management of his diabetes. He mentions attempting to bring his glucometer today but appears to have lost it during transit. He will look for  it when he returns home. Mentions cbgs ranging from 140-160s with higher cbg readings recently. He mentions taking 1 dose of prednisone from his friend. Currently taking Soliqua 40units, Novolog 18 units TID and metformin w/o significant side effects or hypoglycemic effets.   - Hemoglobin a1c - C/w soliquia 40 units daily, novolog 18 units TID, metformin  Screening for colon cancer Discussed colon cancer screening. Mr.Robotham requests repeating FIT test  - FIT stool test  Chronic  diastolic heart failure (HCC) Prior Echo 50-55% w/ grade 1 diastolic dysfunction. He denies dyspnea, orthopnea, or chest pain. Mentions ambulating well outside when able. Currently on furosemide 40mg  daily. Appear euvolemic on exam today.  - BMP - C/w furosemide 40mg  daily  Pain in left testicle Mr.Guidroz is a 69 yo M w/ PMH of CAD s/p CABG, HFpEF, T2DM, Hepatic cirrhosis, Depression and chronic joint pains presenting to [facility] w/ complaint of testicular pain. He was in his usual state of health until 4 days prior he developed worsening testicular swelling with gradual onset pain. Describes as throbbing pain. Denies any penile discharge, dysuria. Does endorse frequency but also is on diuretics. Mentions feeling significant distress due to concerns regarding prostate cancer. Denies hesitancy or weak stream. Currently not sexually active. Denies any trauma. He states he took his friend's prednisone 1 time and his swelling appear to be improving. Continues to endorse pain but severity has improved.  A/P Presents w/ testicular pain. Differential includes inguinal hernia, epididymitis vs testicular torsion vs scrotal swelling from hx of HFpEF. On exam, mild tenderness to palpation but no edema or erythema. No elicitable hernia. Swelling appears to have resolved but continuing to endorse persistent pain. Will evaluate further w/ ultrasound. - Scrotal ultrasound w/ doppler  Dermoid inclusion cyst Presents w/ dermoid inclusion cyst of 3 month duration on his back. Mentions rubbing his back against a post due to pruritus with foul-smelling discharge. Continuing to endorse pruritus but denies significant tenderness. Physical exam shows two areas of desquamatization likely due to friction from aggressive scratching. Advised not to scratch if possible and use skin protectants, such as Vaseline. Also considering age and area of sun-exposure, may need biopsy if continues to persist and/or develop into high-risk  features.    Patient discussed with Dr. Dareen Piano   -Gilberto Better, PGY2 [facility] Pager: [phone]

## 2019-08-07 NOTE — Patient Instructions (Signed)
Thank you for allowing [place] to provide your care today. Today we discussed your testicular pain    I have ordered bmp, hemoglobin a1c labs for you. I will call if any are abnormal.    Today we made no changes to your medications.    Please follow-up in 3 months.    Should you have any questions or concerns please call the internal medicine clinic at [phone].     Scrotal Swelling Scrotal swelling refers to a condition in which the sac of skin that contains the testes (scrotum) is enlarged or swollen. Many things can cause the scrotum to enlarge or swell, including:  Fluid around the testicle (hydrocele).  A weakened area in the muscles around the groin (hernia).  An enlarged vein around the testicle (varicocele).  An injury.  An infection.  Certain medical treatments.  Certain medical conditions, such as congestive heart failure.  A recent genital surgery or procedure.  A twisting of the spermatic cord that cuts off blood supply (testicular torsion).  Testicular cancer. Scrotal swelling can happen along with scrotal pain. Follow these instructions at home:  Until the swelling goes away: ? Rest. The best position to rest in is to lie down. ? Limit activity.  Put ice on the scrotum: ? Put ice in a plastic bag. ? Place a towel between your skin and the bag. ? Leave the ice on for 20 minutes, 2-3 times a day for 1-2 days.  Place a rolled towel under your testicles for support.  Wear loose-fitting clothing or an athletic support cup for comfort.  Take over-the-counter and prescription medicines only as told by your health care provider.  Perform a monthly self-exam of the scrotum and penis. Feel for changes. Ask your health care provider how to perform a monthly self-exam if you are unsure. Contact a health care provider if:  You have a sudden pain that is persistent and does not improve.  You have a heavy feeling or notice fluid in the scrotum.  You have pain  or burning while urinating.  You have blood in your urine or semen.  You feel a lump around the testicle.  You notice that one testicle is larger than the other. Keep in mind that a small difference in size is normal.  You have a persistent dull ache or pain in your groin or scrotum. Get help right away if:  The pain does not go away.  The pain becomes severe.  You have a fever or chills.  You have pain or vomiting that cannot be controlled.  One or both sides of the scrotum are very red and swollen.  There is redness spreading upward from your scrotum to your abdomen or downward from your scrotum to your thighs. Summary  Scrotal swelling refers to a condition in which the sac of skin that contains the testes (scrotum) is enlarged.  Many things can cause the scrotum to swell, including hydrocele, a hernia, and a varicocele.  Limiting activity and icing the scrotum may help reduce swelling and pain.  Contact your health care provider if you develop scrotal pain that is sudden and persistent, or if you have pain while urinating. Do this also if you feel a lump around the testicle or notice blood in your urine or semen.  Get help right away for uncontrolled pain or vomiting, for very red and swollen scrotum, or for fever or chills. This information is not intended to replace advice given to you by your health  care provider. Make sure you discuss any questions you have with your health care provider. Document Revised: 02/09/2017 Document Reviewed: 05/15/2016 Elsevier Patient Education  [place].

## 2019-08-07 NOTE — Assessment & Plan Note (Addendum)
Mark Cabrera is a 69 yo M w/ PMH of CAD s/p CABG, HFpEF, T2DM, Hepatic cirrhosis, Depression and chronic joint pains presenting to [facility] w/ complaint of testicular pain. He was in his usual state of health until 4 days prior he developed worsening testicular swelling with gradual onset pain. Describes as throbbing pain. Denies any penile discharge, dysuria. Does endorse frequency but also is on diuretics. Mentions feeling significant distress due to concerns regarding prostate cancer. Denies hesitancy or weak stream. Currently not sexually active. Denies any trauma. He states he took his friend's prednisone 1 time and his swelling appear to be improving. Continues to endorse pain but severity has improved.  A/P Presents w/ testicular pain. Differential includes inguinal hernia, epididymitis vs testicular torsion vs scrotal swelling from hx of HFpEF. On exam, mild tenderness to palpation but no edema or erythema. No elicitable hernia. Swelling appears to have resolved but continuing to endorse persistent pain. Will evaluate further w/ ultrasound. - Scrotal ultrasound w/ doppler

## 2019-08-08 LAB — BMP8+ANION GAP
Anion Gap: 18 mmol/L (ref 10.0–18.0)
BUN/Creatinine Ratio: 19 (ref 10–24)
BUN: 20 mg/dL (ref 8–27)
CO2: 22 mmol/L (ref 20–29)
Calcium: 9.9 mg/dL (ref 8.6–10.2)
Chloride: 99 mmol/L (ref 96–106)
Creatinine, Ser: 1.05 mg/dL (ref 0.76–1.27)
GFR calc Af Amer: 83 mL/min/{1.73_m2} (ref >59)
GFR calc non Af Amer: 72 mL/min/{1.73_m2} (ref >59)
Glucose: 139 mg/dL — ABNORMAL HIGH (ref 65–99)
Potassium: 3.7 mmol/L (ref 3.5–5.2)
Sodium: 139 mmol/L (ref 134–144)

## 2019-08-08 LAB — HEMOGLOBIN A1C
Est. average glucose Bld gHb Est-mCnc: 174 mg/dL
Hgb A1c MFr Bld: 7.7 % — ABNORMAL HIGH (ref 4.8–5.6)

## 2019-08-12 NOTE — Progress Notes (Signed)
Internal Medicine Clinic Attending ° °Case discussed with Dr. Lee at the time of the visit.  We reviewed the resident’s history and exam and pertinent patient test results.  I agree with the assessment, diagnosis, and plan of care documented in the resident’s note.  °

## 2019-08-20 ENCOUNTER — Other Ambulatory Visit: Payer: Self-pay | Admitting: Cardiovascular Disease

## 2019-08-21 ENCOUNTER — Other Ambulatory Visit (HOSPITAL_COMMUNITY): Payer: Self-pay | Admitting: Cardiovascular Disease

## 2019-08-25 ENCOUNTER — Other Ambulatory Visit: Payer: Self-pay

## 2019-08-25 ENCOUNTER — Other Ambulatory Visit: Payer: Self-pay | Admitting: *Deleted

## 2019-08-25 ENCOUNTER — Other Ambulatory Visit: Payer: Medicare Other

## 2019-08-25 ENCOUNTER — Ambulatory Visit (HOSPITAL_COMMUNITY)
Admission: RE | Admit: 2019-08-25 | Discharge: 2019-08-25 | Disposition: A | Discharge status: Home / Self Care | Payer: Medicare Other | Source: Ambulatory Visit | Attending: Internal Medicine | Admitting: Internal Medicine

## 2019-08-25 DIAGNOSIS — N50812 Left testicular pain: Secondary | ICD-10-CM | POA: Diagnosis not present

## 2019-08-25 DIAGNOSIS — Z1211 Encounter for screening for malignant neoplasm of colon: Secondary | ICD-10-CM | POA: Diagnosis not present

## 2019-08-26 LAB — FECAL OCCULT BLOOD, IMMUNOCHEMICAL: Fecal Occult Bld: NEGATIVE

## 2019-08-27 ENCOUNTER — Telehealth: Payer: Self-pay | Admitting: Internal Medicine

## 2019-08-27 DIAGNOSIS — L720 Epidermal cyst: Secondary | ICD-10-CM

## 2019-08-27 NOTE — Telephone Encounter (Signed)
Discussed with Mark Cabrera regarding his ultrasound results. Discussed findings of his varicocele being the cause of his pain. Discussed importance of taking his furosemide as prescribed to ensure euvolemia and avoiding foods with salt. Also discussed his elevated hgb a1c and avoiding taking prednisone as he had prior hx of taking his friend's prednisone. Mark Cabrera expressed understanding. He also mentions that his back cyst has continued to endorse pruritus and requests referral to dermatology.   - Referral for dermatology made

## 2019-09-05 LAB — HM DIABETES EYE EXAM

## 2019-09-15 ENCOUNTER — Other Ambulatory Visit (HOSPITAL_COMMUNITY): Payer: Self-pay | Admitting: Internal Medicine

## 2019-09-15 ENCOUNTER — Other Ambulatory Visit: Payer: Self-pay | Admitting: Internal Medicine

## 2019-09-15 DIAGNOSIS — Z794 Long term (current) use of insulin: Secondary | ICD-10-CM

## 2019-09-15 MED ORDER — INSULIN ASPART 100 UNIT/ML FLEXPEN: 18.0000 [IU] | PEN_INJECTOR | Freq: Three times a day (TID) | SUBCUTANEOUS | 2 refills | Status: DC

## 2019-09-15 MED ORDER — SOLIQUA 100-33 UNT-MCG/ML ~~LOC~~ SOPN: 40.0000 [IU] | PEN_INJECTOR | SUBCUTANEOUS | 9 refills | Status: DC

## 2019-09-26 ENCOUNTER — Telehealth: Payer: Self-pay | Admitting: *Deleted

## 2019-09-26 ENCOUNTER — Encounter: Payer: Medicare Other | Admitting: Internal Medicine

## 2019-09-26 NOTE — Telephone Encounter (Signed)
Pt did not come to his appt this morning. Called pt - the lady he border from , answered the telephone -stated his telephone is not working. Stated I was calling to see if he wanted to re-schedule his appt. Stated she will relay message to him.

## 2019-09-26 NOTE — Telephone Encounter (Signed)
Thank you for reaching out to him

## 2019-10-06 DIAGNOSIS — Z23 Encounter for immunization: Secondary | ICD-10-CM | POA: Diagnosis not present

## 2019-10-16 DIAGNOSIS — L600 Ingrowing nail: Secondary | ICD-10-CM | POA: Diagnosis not present

## 2019-10-16 DIAGNOSIS — Z794 Long term (current) use of insulin: Secondary | ICD-10-CM | POA: Diagnosis not present

## 2019-10-16 DIAGNOSIS — M79674 Pain in right toe(s): Secondary | ICD-10-CM | POA: Diagnosis not present

## 2019-10-16 DIAGNOSIS — E1140 Type 2 diabetes mellitus with diabetic neuropathy, unspecified: Secondary | ICD-10-CM | POA: Diagnosis not present

## 2019-10-16 DIAGNOSIS — M79675 Pain in left toe(s): Secondary | ICD-10-CM | POA: Diagnosis not present

## 2019-10-16 DIAGNOSIS — B351 Tinea unguium: Secondary | ICD-10-CM | POA: Diagnosis not present

## 2019-11-19 ENCOUNTER — Telehealth: Payer: Self-pay

## 2019-11-19 NOTE — Telephone Encounter (Signed)
Unable to lmom to bring new insurance or call us back w/updated insurance information

## 2019-12-03 ENCOUNTER — Other Ambulatory Visit (HOSPITAL_COMMUNITY): Payer: Self-pay | Admitting: Student

## 2019-12-03 ENCOUNTER — Ambulatory Visit (INDEPENDENT_AMBULATORY_CARE_PROVIDER_SITE_OTHER): Payer: Medicare Other | Admitting: Student

## 2019-12-03 ENCOUNTER — Encounter: Payer: Self-pay | Admitting: Dietician

## 2019-12-03 ENCOUNTER — Other Ambulatory Visit: Payer: Self-pay

## 2019-12-03 ENCOUNTER — Encounter: Payer: Self-pay | Admitting: Student

## 2019-12-03 VITALS — BP 121/66 | HR 84 | Temp 97.9°F | Ht 68.0 in | Wt 220.7 lb

## 2019-12-03 DIAGNOSIS — H538 Other visual disturbances: Secondary | ICD-10-CM

## 2019-12-03 DIAGNOSIS — R1900 Intra-abdominal and pelvic swelling, mass and lump, unspecified site: Secondary | ICD-10-CM

## 2019-12-03 DIAGNOSIS — Z0001 Encounter for general adult medical examination with abnormal findings: Secondary | ICD-10-CM | POA: Diagnosis not present

## 2019-12-03 DIAGNOSIS — D1722 Benign lipomatous neoplasm of skin and subcutaneous tissue of left arm: Secondary | ICD-10-CM | POA: Diagnosis not present

## 2019-12-03 DIAGNOSIS — E1149 Type 2 diabetes mellitus with other diabetic neurological complication: Secondary | ICD-10-CM

## 2019-12-03 DIAGNOSIS — G4730 Sleep apnea, unspecified: Secondary | ICD-10-CM

## 2019-12-03 DIAGNOSIS — R0601 Orthopnea: Secondary | ICD-10-CM | POA: Diagnosis not present

## 2019-12-03 DIAGNOSIS — Z0000 Encounter for general adult medical examination without abnormal findings: Secondary | ICD-10-CM

## 2019-12-03 DIAGNOSIS — L720 Epidermal cyst: Secondary | ICD-10-CM

## 2019-12-03 DIAGNOSIS — Z794 Long term (current) use of insulin: Secondary | ICD-10-CM | POA: Diagnosis not present

## 2019-12-03 DIAGNOSIS — I480 Paroxysmal atrial fibrillation: Secondary | ICD-10-CM

## 2019-12-03 DIAGNOSIS — H547 Unspecified visual loss: Secondary | ICD-10-CM

## 2019-12-03 DIAGNOSIS — Z23 Encounter for immunization: Secondary | ICD-10-CM

## 2019-12-03 DIAGNOSIS — J90 Pleural effusion, not elsewhere classified: Secondary | ICD-10-CM

## 2019-12-03 LAB — POCT GLYCOSYLATED HEMOGLOBIN (HGB A1C): Hemoglobin A1C: 9.8 % — AB (ref 4.0–5.6)

## 2019-12-03 LAB — GLUCOSE, CAPILLARY: Glucose-Capillary: 254 mg/dL — ABNORMAL HIGH (ref 70–99)

## 2019-12-03 MED ORDER — EVOLOCUMAB 140 MG/ML ~~LOC~~ SOAJ: 1.0000 "pen " | SUBCUTANEOUS | 6 refills | Status: DC

## 2019-12-03 MED ORDER — APIXABAN 5 MG PO TABS: ORAL_TABLET | ORAL | 0 refills | Status: DC

## 2019-12-03 MED ORDER — INSULIN LISPRO (1 UNIT DIAL) 100 UNIT/ML (KWIKPEN): 18.0000 [IU] | PEN_INJECTOR | Freq: Three times a day (TID) | SUBCUTANEOUS | 0 refills | Status: DC

## 2019-12-03 MED ORDER — CLOPIDOGREL BISULFATE 75 MG PO TABS: ORAL_TABLET | ORAL | 0 refills | Status: DC

## 2019-12-03 MED ORDER — SOLIQUA 100-33 UNT-MCG/ML ~~LOC~~ SOPN: 40.0000 [IU] | PEN_INJECTOR | SUBCUTANEOUS | 9 refills | Status: AC

## 2019-12-03 NOTE — Assessment & Plan Note (Signed)
Patient says he has had increasing swelling on the left side of his abdomen. He says it increases when he gets up from bed and strains. Mentions he was previously told this was a side effect of his CABG. Denies abdominal pain, vomiting, diarrhea, constipation. He says he is unaware of a previous cirrhosis diagnosis, although his memory is impaired due to his previous strokes.   A/P: -Unclear on etiology of symptom, but likely due to abdominal wall hernia. Given history of cirrhosis previously, will obtain full abdomen ultrasound.

## 2019-12-03 NOTE — Assessment & Plan Note (Signed)
Patient with recent FIT test. States he has had both Pfizer COVID-19 vaccines. Patient is hesitant towards influenza vaccine, but agreed after education.

## 2019-12-03 NOTE — Patient Instructions (Addendum)
Mark Cabrera,   It was a pleasure seeing you today!  Please see the following notes regarding your medications and referrals:  BEFORE Breakfast: -SOLIQUA, 40u -HUMALOG, 18u (Take 15 minutes before eating breakfast)  IN THE MORNING: apixaban (ELIQUIS) 5mg , one tablet Clopidogrel (PLAVIX) 75mg , one tablet Furosemide (LASIX) 40mg , one tablet Lisinopril (ZESTERIL) 10mg , one tablet Metformin (GLUCOPHAGE), 500mg , one tablet Bempedoic acid-ezetimibe (NEXLIZET), 180-10mg , one tablet  LUNCH: -HUMALAOG, 18u (Take 15 minutes before eating lunch)  DINNER: -HUMALOG, 18u (Take 15 minutes before eating dinner)  EVERY TWO WEEKS: -Evolocumab injection  -We have put in a referral for dermatology, eye doctor, sleep study, and ultrasound for your abdomen.  -We will see you back in about three months to re-check your A1c. If you have increased shortness of breath or worsening symptoms, please make an appointment sooner.  We look forward to seeing you next time. Please call our clinic at [phone] if you have any questions or concerns. The best time to call is Monday-Friday from 9am-4pm, but there is someone available 24/7 at the same number. If you need medication refills, please notify your pharmacy one week in advance and they will send [place] a request.  Thank you for letting us take part in your care. Wishing you the best!  Thank you, Dr. [name], MD

## 2019-12-03 NOTE — Assessment & Plan Note (Addendum)
Patient reports recent shortness of breath upon laying down. Mentions it has been difficult to breath when laying on right side and supine over the last few months. He says he has mild chronic dyspnea on exertion, but no acute changes. Denies chest pain, leg swelling. States he has history of sleep apnea but has not used CPAP in years.   A/P: -On exam, patient does not appear volume overloaded, lung exam clear with trace bilateral edema. -Patient says he takes Lasix regularly. Last prescription from [facility] lasted through August, but patient brought bottle of Lasix that was prescribed from his eye doctor and says he takes it every day. -Most likely due to sleep apnea, but cannot rule out heart failure. Obtaining sleep study and will follow-up with patient in three months. -Continue Lasix 40mg  once per day. -Can consider chest x-ray at next appointment if shortness of breath has not improved. Discussed with patient if shortness of breath worsens, make an appointment sooner.

## 2019-12-03 NOTE — Progress Notes (Signed)
   CC: shortness of breath  HPI:   [name] is a 69 y.o. with medical history as listed below who presents to clinic for shortness of breath, abdominal swelling.  Please see problem-based list for further evaluation and details.  Past Medical History:  Diagnosis Date  . Acute diastolic congestive heart failure ([place])   . Aortic valve stenosis s/p AVR 2018  . Atrial fibrillation ([place]) - post-op CABG    04/2016 CHA2DS2VAS score = 5  . Atypical nevi   . Coronary artery disease s/p 2 vessel CABG   . Depression    "years ago"  . Diabetes mellitus   . Dyspnea    in the past   . GERD (gastroesophageal reflux disease)   . Heart murmur   . Hepatic cirrhosis ([place]) 06/29/2017  . History of kidney stones   . Hyperlipidemia    hx of transaminitis secondary to statin and he has decided not to use statins secondary to potential side effects.  . Hypertension   . Nephrolithiasis   . Osteoarthritis, knee   . Sleep apnea     Central apnea. Not using cpap  . Stroke [facility]) 2007  . Transaminitis     Statin-induced  . 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 2-D echo was normal no embolic source found.   Review of Systems:  As per HPI  Physical Exam:  Vitals:   12/03/19 1332  BP: 121/66  Pulse: 84  Temp: 97.9 F (36.6 C)  TempSrc: Oral  SpO2: 98%  Weight: 220 lb 11.2 oz (100.1 kg)  Height: 5\' 8"  (1.727 m)   General: Pleasant, sitting in chair, no acute distress CV: Regular rate, rhythm. No murmurs, gallops, rubs. Pulm: Clear to auscultation. No wheezes, rales. Abd: Soft, non-tender, non-distended. No fluid wave present. L side of abdomen appears to have non-pulsatile, non-painful bulging. MSK: Trace  pitting edema bilaterally. Skin: Sternotomy scar midline-chest. Soft, deep palpable cyst on L arm.  Assessment & Plan:   See Encounters Tab for problem based charting.  Patient seen with Dr. Daryll Drown

## 2019-12-03 NOTE — Assessment & Plan Note (Deleted)
Patient says he is inconsistent with taking his blood sugar daily. He says he sometimes feels numb on his left arm and left side of abdomen. Mentions he takes his medications as prescribed. A1c today 9.8 from 7.7 on 07/2019.  A/P: -After reviewing his medications that he brought today, unclear whether patient takes metformin and medications as prescribed. Educated patient on which medications he should take. -Continue metformin 500mg , once per day, Soliqua 40 units once per day, Humalog 18 units three times per day. -Will need close follow-up for medication adherence and education. Plan for return to clinic in 3 months.

## 2019-12-03 NOTE — Assessment & Plan Note (Signed)
Patient states he has chronic mass on left upper arm. States it is sometimes tender. Mentions he is worried since he has a friend who recently passed away due to skin cancer. Denies skin changes, fever, or recent change in size. Previous ultrasound revealed lipoma. Says he has never been to a dermatologist.  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 -Patient often works out in yard and has not been to a Paediatric nurse before. Will send in referral for dermatology appointment.

## 2019-12-03 NOTE — Assessment & Plan Note (Signed)
Patient says he is inconsistent with taking his blood sugar daily. He says he sometimes feels numb on his left arm and left side of abdomen. Mentions he takes his medications as prescribed. A1c today 9.8 from 7.7 on 07/2019.  A/P: -After reviewing his medications that he brought today, unclear whether patient takes metformin and medications as prescribed. Educated patient on which medications he should take. Will continue with current medication for now. -Continue metformin 500mg , once per day, Soliqua 40 units once per day, Humalog 18 units three times per day. -Will need close follow-up for medication adherence and education. Plan for return to clinic in 3 months.

## 2019-12-03 NOTE — Assessment & Plan Note (Signed)
Patient currently seen by ophthalmologist, but reports he would like to be referred to a different physician. Referral placed.

## 2019-12-05 DIAGNOSIS — H3342 Traction detachment of retina, left eye: Secondary | ICD-10-CM | POA: Diagnosis not present

## 2019-12-05 DIAGNOSIS — T85398D Other mechanical complication of other ocular prosthetic devices, implants and grafts, subsequent encounter: Secondary | ICD-10-CM | POA: Diagnosis not present

## 2019-12-05 DIAGNOSIS — H43811 Vitreous degeneration, right eye: Secondary | ICD-10-CM | POA: Diagnosis not present

## 2019-12-08 ENCOUNTER — Telehealth: Payer: Self-pay | Admitting: *Deleted

## 2019-12-08 NOTE — Telephone Encounter (Addendum)
Information was sent through CoverMyMeds for PA for [facility].   Awaiting determination.  Sander Nephew, RN 12/08/2019 1:56 PM. Fax from [employer] no PA is needed for this medication.  Sander Nephew, RN 12/09/2019 11:15 AM.

## 2019-12-09 NOTE — Progress Notes (Signed)
Internal Medicine Clinic Attending  I saw and evaluated the patient.  I personally confirmed the key portions of the history and exam documented by Dr. Braswell and I reviewed pertinent patient test results.  The assessment, diagnosis, and plan were formulated together and I agree with the documentation in the resident's note.  

## 2019-12-23 ENCOUNTER — Encounter: Payer: Medicare Other | Admitting: Student

## 2019-12-23 ENCOUNTER — Other Ambulatory Visit: Payer: Self-pay

## 2019-12-24 NOTE — Addendum Note (Signed)
Addended bySanjuan Dame on: 12/24/2019 02:58 PM   Modules accepted: Orders

## 2019-12-29 ENCOUNTER — Encounter: Payer: Medicare Other | Admitting: Internal Medicine

## 2020-01-07 ENCOUNTER — Telehealth: Payer: Self-pay

## 2020-01-07 ENCOUNTER — Ambulatory Visit (HOSPITAL_COMMUNITY): Payer: Medicare Other

## 2020-01-07 ENCOUNTER — Other Ambulatory Visit: Payer: Self-pay | Admitting: Internal Medicine

## 2020-01-07 DIAGNOSIS — K7460 Unspecified cirrhosis of liver: Secondary | ICD-10-CM

## 2020-01-07 NOTE — Progress Notes (Signed)
Received message from [facility] depart. Requesting change from previous abdominal US complete to limited for his cirrhosis. Unable to addend previous order due to scheduling. Will put in new order

## 2020-01-07 NOTE — Telephone Encounter (Signed)
Received TC from [facility] Rad[facility] department, states patient is having U/S today and order was put in incorrectly.  Was ordered for [place] Complete, should have been ordered for [place] Limited, asking for someone to correct order.  Appt is scheduled for 0900 today., patient not here yet.  Will forward to yellow team. Will also forward to [place] d/t possible recertification? SChaplin, RN,BSN

## 2020-01-07 NOTE — Telephone Encounter (Signed)
Verified no Authorization was required for this patient order.

## 2020-01-20 ENCOUNTER — Ambulatory Visit (HOSPITAL_COMMUNITY)
Admission: RE | Admit: 2020-01-20 | Discharge: 2020-01-20 | Disposition: A | Discharge status: Home / Self Care | Payer: Medicare Other | Source: Ambulatory Visit | Attending: Internal Medicine | Admitting: Internal Medicine

## 2020-01-20 DIAGNOSIS — K7460 Unspecified cirrhosis of liver: Secondary | ICD-10-CM | POA: Diagnosis not present

## 2020-02-02 ENCOUNTER — Other Ambulatory Visit: Payer: Self-pay | Admitting: Internal Medicine

## 2020-02-02 ENCOUNTER — Encounter: Payer: Self-pay | Admitting: *Deleted

## 2020-02-02 ENCOUNTER — Ambulatory Visit (HOSPITAL_COMMUNITY)
Admission: RE | Admit: 2020-02-02 | Discharge: 2020-02-02 | Disposition: A | Discharge status: Home / Self Care | Payer: Medicare Other | Source: Ambulatory Visit | Attending: Internal Medicine | Admitting: Internal Medicine

## 2020-02-02 ENCOUNTER — Other Ambulatory Visit: Payer: Self-pay

## 2020-02-02 DIAGNOSIS — R1900 Intra-abdominal and pelvic swelling, mass and lump, unspecified site: Secondary | ICD-10-CM

## 2020-02-13 ENCOUNTER — Ambulatory Visit (INDEPENDENT_AMBULATORY_CARE_PROVIDER_SITE_OTHER): Payer: Medicare Other

## 2020-02-13 ENCOUNTER — Other Ambulatory Visit: Payer: Self-pay

## 2020-02-13 ENCOUNTER — Encounter (HOSPITAL_COMMUNITY): Payer: Self-pay | Admitting: Emergency Medicine

## 2020-02-13 ENCOUNTER — Ambulatory Visit (HOSPITAL_COMMUNITY)
Admission: EM | Admit: 2020-02-13 | Discharge: 2020-02-13 | Disposition: A | Discharge status: Home / Self Care | Payer: Medicare Other | Attending: Family Medicine | Admitting: Family Medicine

## 2020-02-13 DIAGNOSIS — B349 Viral infection, unspecified: Secondary | ICD-10-CM | POA: Diagnosis not present

## 2020-02-13 DIAGNOSIS — R059 Cough, unspecified: Secondary | ICD-10-CM | POA: Diagnosis not present

## 2020-02-13 DIAGNOSIS — S59901A Unspecified injury of right elbow, initial encounter: Secondary | ICD-10-CM | POA: Diagnosis not present

## 2020-02-13 DIAGNOSIS — R0602 Shortness of breath: Secondary | ICD-10-CM

## 2020-02-13 DIAGNOSIS — W1830XA Fall on same level, unspecified, initial encounter: Secondary | ICD-10-CM | POA: Insufficient documentation

## 2020-02-13 DIAGNOSIS — U071 COVID-19: Secondary | ICD-10-CM | POA: Insufficient documentation

## 2020-02-13 DIAGNOSIS — M25521 Pain in right elbow: Secondary | ICD-10-CM

## 2020-02-13 DIAGNOSIS — M7989 Other specified soft tissue disorders: Secondary | ICD-10-CM | POA: Diagnosis not present

## 2020-02-13 DIAGNOSIS — J90 Pleural effusion, not elsewhere classified: Secondary | ICD-10-CM | POA: Diagnosis not present

## 2020-02-13 LAB — SARS CORONAVIRUS 2 (TAT 6-24 HRS): SARS Coronavirus 2: POSITIVE — AB

## 2020-02-13 NOTE — ED Provider Notes (Signed)
[place]    CSN: [id number] Arrival date & time: 02/13/20  4098      History   Chief Complaint Chief Complaint  Patient presents with  . Fever  . Cough    HPI Mark Cabrera is a 68 y.o. male.   Patient is a 69 year old male past medical history of congestive heart failure, aortic valve stenosis, A. fib, CAD, depression, diabetes, GERD, hepatic cirrhosis, kidney stones, hyperlipidemia, hypertension, osteoarthritis, sleep apnea, stroke, transaminitis, vertebral artery dissection.  He presents today with approximately 3 days of fever, fatigue, cough.  Reported temp of 100 at home.  Has been taking Tylenol.  Mild shortness of breath.  Denies any sore throat, ear pain.  Had a rapid COVID-19 test at home that was positive.  Has a Wynetta Emery and Wynetta Emery vaccine many months ago has not had a booster. He is also complaining of right elbow pain after having a fall on the gravel outside.  Fell backwards onto elbow.  Denies hitting head or any loss of consciousness.  Patient currently on Xarelto.     Past Medical History:  Diagnosis Date  . Acute diastolic congestive heart failure ([place])   . Aortic valve stenosis s/p AVR 2018  . Atrial fibrillation ([place]) - post-op CABG    04/2016 CHA2DS2VAS score = 5  . Atypical nevi   . Coronary artery disease s/p 2 vessel CABG   . Depression    "years ago"  . Diabetes mellitus   . Dyspnea    in the past   . GERD (gastroesophageal reflux disease)   . Heart murmur   . Hepatic cirrhosis ([place]) 06/29/2017  . History of kidney stones   . Hyperlipidemia    hx of transaminitis secondary to statin and he has decided not to use statins secondary to potential side effects.  . Hypertension   . Nephrolithiasis   . Osteoarthritis, knee   . Sleep apnea     Central apnea. Not using cpap  . Stroke [facility]) 2007  . Transaminitis     Statin-induced  . 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 2-D echo was normal no embolic source found.    Patient Active Problem List   Diagnosis Date Noted  . Orthopnea 12/03/2019  . Abdominal wall bulge 12/03/2019  . Pain in left testicle 08/07/2019  . Dermoid inclusion cyst 08/07/2019  . Chronic left shoulder pain 05/13/2019  . Depression 11/05/2018  . Blurry vision, bilateral 09/20/2018  . Rotator cuff syndrome, left   . 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 CVA (cerebral vascular accident) ([place]) 06/28/2018  . Hepatic cirrhosis ([place]) 06/29/2017  . Candida albicans infection 01/09/2017  . Healthcare maintenance 12/16/2016  . Lipoma of left upper extremity 09/12/2016  . Chronic knee pain 07/04/2016  . Decreased visual acuity 07/04/2016  . Peripheral neuropathic pain 05/12/2016  . Pericardial effusion a. subxiphoid pericardial window on 04/21/2016 04/28/2016  . Pleural effusion on left, s/p throacentesis 04/18/16 04/28/2016  . Chest pain   . Paroxysmal atrial fibrillation ([place]) 04/16/2016  . 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  . CAD (coronary artery disease), native coronary artery   . Chronic diastolic heart failure ([place])   .  Sleep apnea 08/02/2009  . 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 Dyslipidemia 05/18/2006  . Hypertensive heart disease   . History of dissection of vertebral artery  ([place]) 08/02/2005    Past Surgical History:  Procedure Laterality Date  . AORTIC VALVE REPLACEMENT N/A 03/30/2016   Procedure: AORTIC VALVE REPLACEMENT (AVR);  Surgeon: [name], MD;  Location: [place];  Service: Open Heart Surgery;  Laterality: N/A;  . CARDIAC CATHETERIZATION N/A 03/15/2016   Procedure: Right/Left Heart Cath and  Coronary Angiography;  Surgeon: Burnell Blanks, MD;  Location: [place] [facility];  Service: Cardiovascular;  Laterality: N/A;  . CLIPPING OF ATRIAL APPENDAGE N/A 03/30/2016   Procedure: CLIPPING OF LEFT ATRIAL APPENDAGE;  Surgeon: [name], MD;  Location: [place];  Service: Open Heart Surgery;  Laterality: N/A;  . CORONARY ARTERY BYPASS GRAFT N/A 03/30/2016   Procedure: CORONARY ARTERY BYPASS GRAFTING (CABG) Times Two;  Surgeon: [name], MD;  Location: [place];  Service: Open Heart Surgery;  Laterality: N/A;  . KNEE ARTHROSCOPY W/ PARTIAL MEDIAL MENISCECTOMY  05/12/2005   right, performed by Dr. French Ana for torn medial meniscus.  Marland Kitchen ROTATOR CUFF REPAIR Right 2016  . STERNAL WIRES REMOVAL N/A 08/13/2017   Procedure: STERNAL WIRES REMOVAL;  Surgeon: [name], MD;  Location: [place];  Service: Thoracic;  Laterality: N/A;  . SUBXYPHOID PERICARDIAL WINDOW N/A 04/21/2016   Procedure: SUBXYPHOID PERICARDIAL WINDOW;  Surgeon: [name], MD;  Location: [place];  Service: Thoracic;  Laterality: N/A;  . TEE WITHOUT CARDIOVERSION N/A 03/30/2016   Procedure: TRANSESOPHAGEAL ECHOCARDIOGRAM (TEE);  Surgeon: [name], MD;  Location: [place];  Service: Open Heart Surgery;  Laterality: N/A;  . TEE WITHOUT CARDIOVERSION N/A 04/21/2016   Procedure: TRANSESOPHAGEAL ECHOCARDIOGRAM (TEE);  Surgeon: [name], MD;  Location: [facility];  Service: Thoracic;  Laterality: N/A;       Home Medications    Prior to Admission medications   Medication Sig Start Date End Date Taking? Authorizing Provider  acetaminophen (TYLENOL) 325 MG tablet Take 2 tablets (650 mg total) by mouth every 4 (four) hours as needed for headache or mild pain. 04/28/16   Isaiah Serge, NP  apixaban (ELIQUIS) 5 MG TABS tablet TAKE 1 TABLET(5 MG) BY MOUTH TWICE DAILY 12/03/19   [name], MD  Bayer Microlet Lancets lancets Check blood sugar 3 times a day as instructed 05/26/19   Ladona Horns, MD  Bempedoic Acid-Ezetimibe (NEXLIZET) 180-10 MG TABS Take 1 tablet by mouth daily. 05/29/19   Burnell Blanks, MD  clopidogrel (PLAVIX) 75 MG tablet TAKE 1 TABLET(75 MG) BY MOUTH DAILY 12/03/19   [name], MD  Evolocumab 140 MG/ML SOAJ Inject 1 pen into the skin every 14 (fourteen) days. 12/03/19   [name], MD  furosemide (LASIX) 40 MG tablet Take 1 tablet (40 mg total) by mouth daily. 05/08/19 12/03/19  Ladona Horns, MD  glucose blood (CONTOUR NEXT TEST) test strip Check blood sugar 3 times a day as instructed 05/26/19   Ladona Horns, MD  Insulin Glargine-Lixisenatide [facility]) 100-33 UNT-MCG/ML SOPN Inject 40 Units into the skin every morning. 12/03/19 03/02/20  [name], MD  insulin lispro (HUMALOG KWIKPEN) 100 UNIT/ML KwikPen Inject 18 Units into the skin 3 (three) times daily with meals. 12/03/19 03/02/20  [name], MD  Insulin Pen Needle 31G X 5 MM MISC Use one pen needle three times daily. Dx E11.49 05/08/19   Ladona Horns, MD  lisinopril (ZESTRIL) 10 MG tablet Take 1 tablet by mouth once daily 08/21/19   Burnell Blanks, MD  metFORMIN (GLUCOPHAGE) 500 MG tablet TAKE 1 TABLET(500 MG) BY MOUTH TWICE DAILY WITH A MEAL 08/07/19   Ladona Horns, MD  neomycin-polymyxin-hydrocortisone (CORTISPORIN) 3.5-10000-1 OTIC suspension Place 3 drops into the left ear 4 (four) times daily. 05/08/19   Ladona Horns, MD  nitroGLYCERIN (NITROSTAT) 0.4 MG SL tablet PLACE 1 TABLET UNDER THE TONGUE EVERY 5 MINUTES AS NEEDED FOR CHEST PAIN 05/08/19   Ladona Horns, MD  insulin aspart (NOVOLOG) 100 UNIT/ML FlexPen Inject 18 Units into the skin 3 (three) times daily with meals. 09/15/19 12/03/19  Jean Rosenthal, MD    Family History Family History  Problem Relation Age of Onset  . Hypertension Mother   . Diabetes Mother   . Alcohol abuse Father     Social History Social History   Tobacco Use  . Smoking status: Never Smoker  . Smokeless tobacco: Never Used  Vaping  Use  . Vaping Use: Never used  Substance Use Topics  . Alcohol use: Yes    Alcohol/week: 1.0 standard drink    Types: 1 Cans of beer per week    Comment: occasional  . Drug use: No     Allergies   Penicillins, Statins, Morphine and related, and Sglt2 inhibitors   Review of Systems Review of Systems   Physical Exam Triage Vital Signs ED Triage Vitals  Enc Vitals Group     BP 02/13/20 0830 115/65     Pulse Rate 02/13/20 0827 85     Resp 02/13/20 0827 19     Temp 02/13/20 0830 98 F (36.7 C)     Temp Source 02/13/20 0830 Oral     SpO2 02/13/20 0827 99 %     Weight --      Height --      Head Circumference --      Peak Flow --      Pain Score 02/13/20 0828 0     Pain Loc --      Pain Edu? --      Excl. in [place]? --    No data found.  Updated Vital Signs BP 115/65 (BP Location: Right Arm)   Pulse 85   Temp 98 F (36.7 C) (Oral)   Resp 19   SpO2 99%   Visual Acuity Right Eye Distance:   Left Eye Distance:   Bilateral Distance:    Right Eye Near:   Left Eye Near:    Bilateral Near:     Physical Exam Constitutional:      General: He is not in acute distress.    Appearance: He is ill-appearing. He is not toxic-appearing.  HENT:     Head: Normocephalic and atraumatic.  Eyes:     Comments: Eye patch on the left eye   Cardiovascular:     Rate and Rhythm: Normal rate and regular rhythm.  Pulmonary:     Effort: Pulmonary effort is normal.     Breath sounds: Normal breath sounds.  Musculoskeletal:     Right elbow: Swelling present. No deformity, effusion or lacerations. Normal range of motion. Tenderness present in olecranon process.  Skin:    General: Skin is warm and dry.  Neurological:     Mental Status: He is alert.  Psychiatric:        Mood and Affect: Mood normal.      UC Treatments / Results  Labs (all labs ordered  are listed, but only abnormal results are displayed) Labs Reviewed  SARS CORONAVIRUS 2 (TAT 6-24 HRS)     EKG   Radiology DG Chest 2 View  Result Date: 02/13/2020 CLINICAL DATA:  Cough, shortness of breath. EXAM: CHEST - 2 VIEW COMPARISON:  February 20, 2019. FINDINGS: The heart size and mediastinal contours are within normal limits. Both lungs are clear. Status post aortic valve repair. No pneumothorax or pleural effusion is noted. The visualized skeletal structures are unremarkable. IMPRESSION: No active cardiopulmonary disease. Electronically Signed   By: Marijo Conception M.D.   On: 02/13/2020 09:21   DG Elbow Complete Right  Result Date: 02/13/2020 CLINICAL DATA:  Fall with elbow pain. EXAM: RIGHT ELBOW - COMPLETE 3+ VIEW COMPARISON:  None. FINDINGS: There is no evidence of fracture, dislocation, or joint effusion. Soft tissue stranding posteriorly. No soft tissue gas or foreign body IMPRESSION: Posterior soft tissue swelling without fracture. Electronically Signed   By: Monte Fantasia M.D.   On: 02/13/2020 09:51    Procedures Procedures (including critical care time)  Medications Ordered in UC Medications - No data to display  Initial Impression / Assessment and Plan / UC Course  I have reviewed the triage vital signs and the nursing notes.  Pertinent labs & imaging results that were available during my care of the patient were reviewed by me and considered in my medical decision making (see chart for details).     Viral illness Patient with rapid Covid test at home that was positive. We have retested patient here today.  Likely the cause of his symptoms Chest x-ray here with no concerns.  Mildly ill-appearing but nontoxic.  His vital signs are stable.  Oxygen saturations are 99%. Likely if he is Covid positive he will qualify for the antibody infusion.  Patient made aware of this. Recommended Tylenol for fever, body aches. Rest, hydrate Return for any worsening problems  Right elbow injury X-ray without any acute findings. Final Clinical Impressions(s) / UC Diagnoses    Final diagnoses:  Viral illness  Injury of right elbow, initial encounter     Discharge Instructions     Your x-ray did not show any concerns today. No fracture of your elbow and your lungs were clear This is most likely something viral. Covid is possible we have tested you for Covid You should be hearing from the antibiotic infusion clinic for possible medication/treatment. You can continue Tylenol as needed Rest, stay hydrated Follow up as needed for continued or worsening symptoms     ED Prescriptions    None     PDMP not reviewed this encounter.   Orvan July, NP 02/13/20 1012

## 2020-02-13 NOTE — ED Triage Notes (Signed)
Patient c/o fever, fatigued, and cough x 3 days.   Patient endorses temp of "over 100" at home.  Patient denies any pain at home.   Patient has taken "2 tylenol pills" at home this morning.   Patient stated at home COVID-19 test was positive.

## 2020-02-13 NOTE — Discharge Instructions (Addendum)
Your x-ray did not show any concerns today. No fracture of your elbow and your lungs were clear This is most likely something viral. Covid is possible we have tested you for Covid You should be hearing from the antibiotic infusion clinic for possible medication/treatment. You can continue Tylenol as needed Rest, stay hydrated Follow up as needed for continued or worsening symptoms

## 2020-02-14 ENCOUNTER — Other Ambulatory Visit: Payer: Self-pay | Admitting: Physician Assistant

## 2020-02-14 ENCOUNTER — Telehealth: Payer: Self-pay | Admitting: Physician Assistant

## 2020-02-14 ENCOUNTER — Telehealth (HOSPITAL_COMMUNITY): Payer: Self-pay | Admitting: *Deleted

## 2020-02-14 DIAGNOSIS — U071 COVID-19: Secondary | ICD-10-CM

## 2020-02-14 DIAGNOSIS — I6300 Cerebral infarction due to thrombosis of unspecified precerebral artery: Secondary | ICD-10-CM

## 2020-02-14 DIAGNOSIS — I480 Paroxysmal atrial fibrillation: Secondary | ICD-10-CM

## 2020-02-14 DIAGNOSIS — E1140 Type 2 diabetes mellitus with diabetic neuropathy, unspecified: Secondary | ICD-10-CM

## 2020-02-14 DIAGNOSIS — I2510 Atherosclerotic heart disease of native coronary artery without angina pectoris: Secondary | ICD-10-CM

## 2020-02-14 DIAGNOSIS — Z951 Presence of aortocoronary bypass graft: Secondary | ICD-10-CM

## 2020-02-14 DIAGNOSIS — Z794 Long term (current) use of insulin: Secondary | ICD-10-CM

## 2020-02-14 NOTE — Telephone Encounter (Signed)
Called to discuss with patient about Covid symptoms and the use of sotrovimab, bamlanivimab/etesevimab or casirivimab/imdevimab, a monoclonal antibody infusion for those with mild to moderate Covid symptoms and at a high risk of hospitalization.  Pt is qualified for this infusion at the [place] infusion center due to; Specific high risk criteria : Older age (>/= 71 yo), BMI > 25, Diabetes and Cardiovascular disease or hypertension   Message left to call back our hotline [phone]. My chart message sent if active on Mychart.   [name] PA-C

## 2020-02-14 NOTE — Telephone Encounter (Signed)
Returned pt's phone call RE: noted + Covid result.  Confirmed with pt that he is Covid +.  Instructed to self-isolate x 10 days from sxs onset, and to notify any persons he has been around currently, up until 2 days prior to sxs onset, so they can monitor for sxs.  Instructed to drink plenty of fluids and take OTC meds as needed for sxs.  Encouraged pt to notify PCP.  Daughter expressed interest in pt receiving mAb treatment - instructed to notify pt's on-call PCP to inquire/request infusion.  Instructed to go to ED for any SOB or any other concerning sxs.  Pt and daughter both verbalized understanding.

## 2020-02-14 NOTE — Progress Notes (Signed)
I connected by phone with Mark Cabrera on 02/14/2020 at 12:37 PM to discuss the potential use of a new treatment for mild to moderate COVID-19 viral infection in non-hospitalized patients.  This patient is a 69 y.o. male that meets the FDA criteria for Emergency Use Authorization of COVID monoclonal antibody sotrovimab, casirivimab/imdevimab or bamlamivimab/estevimab.  Has a (+) direct SARS-CoV-2 viral test result  Has mild or moderate COVID-19   Is NOT hospitalized due to COVID-19  Is within 10 days of symptom onset  Has at least one of the high risk factor(s) for progression to severe COVID-19 and/or hospitalization as defined in EUA.  Specific high risk criteria : Older age (>/= 71 yo), BMI > 25, Diabetes and Cardiovascular disease or hypertension   I have spoken and communicated the following to the patient or parent/caregiver regarding COVID monoclonal antibody treatment:  1. FDA has authorized the emergency use for the treatment of mild to moderate COVID-19 in adults and pediatric patients with positive results of direct SARS-CoV-2 viral testing who are 9 years of age and older weighing at least 40 kg, and who are at high risk for progressing to severe COVID-19 and/or hospitalization.  2. The significant known and potential risks and benefits of COVID monoclonal antibody, and the extent to which such potential risks and benefits are unknown.  3. Information on available alternative treatments and the risks and benefits of those alternatives, including clinical trials.  4. Patients treated with COVID monoclonal antibody should continue to self-isolate and use infection control measures (e.g., wear mask, isolate, social distance, avoid sharing personal items, clean and disinfect "high touch" surfaces, and frequent handwashing) according to CDC guidelines.   5. The patient or parent/caregiver has the option to accept or refuse COVID monoclonal antibody treatment.  After reviewing  this information with the patient, the patient has agreed to receive one of the available covid 19 monoclonal antibodies and will be provided an appropriate fact sheet prior to infusion.  Sx onset 11/29. Set up for infusion on 12/7 @ 10:30am.  Directions given to [facility]. Pt is aware that insurance will be charged an infusion fee. Pt had J&J vaccine. + in epic.   [name] 02/14/2020 12:37 PM

## 2020-02-17 ENCOUNTER — Ambulatory Visit (HOSPITAL_COMMUNITY)
Admission: RE | Admit: 2020-02-17 | Discharge: 2020-02-17 | Disposition: A | Discharge status: Home / Self Care | Payer: Medicare Other | Source: Ambulatory Visit | Attending: Pulmonary Disease | Admitting: Pulmonary Disease

## 2020-02-17 ENCOUNTER — Telehealth: Payer: Self-pay | Admitting: Dietician

## 2020-02-17 ENCOUNTER — Telehealth: Payer: Medicare Other | Admitting: Emergency Medicine

## 2020-02-17 DIAGNOSIS — U071 COVID-19: Secondary | ICD-10-CM | POA: Diagnosis not present

## 2020-02-17 DIAGNOSIS — Z794 Long term (current) use of insulin: Secondary | ICD-10-CM

## 2020-02-17 DIAGNOSIS — E1140 Type 2 diabetes mellitus with diabetic neuropathy, unspecified: Secondary | ICD-10-CM | POA: Insufficient documentation

## 2020-02-17 DIAGNOSIS — I480 Paroxysmal atrial fibrillation: Secondary | ICD-10-CM

## 2020-02-17 DIAGNOSIS — I2510 Atherosclerotic heart disease of native coronary artery without angina pectoris: Secondary | ICD-10-CM

## 2020-02-17 DIAGNOSIS — Z951 Presence of aortocoronary bypass graft: Secondary | ICD-10-CM | POA: Diagnosis not present

## 2020-02-17 DIAGNOSIS — Z23 Encounter for immunization: Secondary | ICD-10-CM | POA: Insufficient documentation

## 2020-02-17 DIAGNOSIS — I6300 Cerebral infarction due to thrombosis of unspecified precerebral artery: Secondary | ICD-10-CM

## 2020-02-17 MED ORDER — FAMOTIDINE IN NACL 20-0.9 MG/50ML-% IV SOLN: 20.0000 mg | Freq: Once | INTRAVENOUS | Status: DC | PRN

## 2020-02-17 MED ORDER — ALBUTEROL SULFATE HFA 108 (90 BASE) MCG/ACT IN AERS: 2.0000 | INHALATION_SPRAY | Freq: Once | RESPIRATORY_TRACT | Status: DC | PRN

## 2020-02-17 MED ORDER — METHYLPREDNISOLONE SODIUM SUCC 125 MG IJ SOLR: 125.0000 mg | Freq: Once | INTRAMUSCULAR | Status: DC | PRN

## 2020-02-17 MED ORDER — SODIUM CHLORIDE 0.9 % IV SOLN: Freq: Once | INTRAVENOUS | Status: AC

## 2020-02-17 MED ORDER — EPINEPHRINE 0.3 MG/0.3ML IJ SOAJ: 0.3000 mg | Freq: Once | INTRAMUSCULAR | Status: DC | PRN

## 2020-02-17 MED ORDER — DIPHENHYDRAMINE HCL 50 MG/ML IJ SOLN: 50.0000 mg | Freq: Once | INTRAMUSCULAR | Status: DC | PRN

## 2020-02-17 MED ORDER — SODIUM CHLORIDE 0.9 % IV SOLN: INTRAVENOUS | Status: DC | PRN

## 2020-02-17 NOTE — Discharge Instructions (Signed)
10 Things You Can Do to Manage Your COVID-19 Symptoms at Home If you have possible or confirmed COVID-19: 1. Stay home from work and school. And stay away from other public places. If you must go out, avoid using any kind of public transportation, ridesharing, or taxis. 2. Monitor your symptoms carefully. If your symptoms get worse, call your healthcare provider immediately. 3. Get rest and stay hydrated. 4. If you have a medical appointment, call the healthcare provider ahead of time and tell them that you have or may have COVID-19. 5. For medical emergencies, call 911 and notify the dispatch personnel that you have or may have COVID-19. 6. Cover your cough and sneezes with a tissue or use the inside of your elbow. 7. Wash your hands often with soap and water for at least 20 seconds or clean your hands with an alcohol-based hand sanitizer that contains at least 60% alcohol. 8. As much as possible, stay in a specific room and away from other people in your home. Also, you should use a separate bathroom, if available. If you need to be around other people in or outside of the home, wear a mask. 9. Avoid sharing personal items with other people in your household, like dishes, towels, and bedding. 10. Clean all surfaces that are touched often, like counters, tabletops, and doorknobs. Use household cleaning sprays or wipes according to the label instructions. cdc.gov/coronavirus 09/11/2018 This information is not intended to replace advice given to you by your health care provider. Make sure you discuss any questions you have with your health care provider. Document Revised: 02/13/2019 Document Reviewed: 02/13/2019 Elsevier Patient Education  2020 Elsevier Inc. What types of side effects do monoclonal antibody drugs cause?  Common side effects  In general, the more common side effects caused by monoclonal antibody drugs include: . Allergic reactions, such as hives or itching . Flu-like signs and  symptoms, including chills, fatigue, fever, and muscle aches and pains . Nausea, vomiting . Diarrhea . Skin rashes . Low blood pressure   The CDC is recommending patients who receive monoclonal antibody treatments wait at least 90 days before being vaccinated.  Currently, there are no data on the safety and efficacy of mRNA COVID-19 vaccines in persons who received monoclonal antibodies or convalescent plasma as part of COVID-19 treatment. Based on the estimated half-life of such therapies as well as evidence suggesting that reinfection is uncommon in the 90 days after initial infection, vaccination should be deferred for at least 90 days, as a precautionary measure until additional information becomes available, to avoid interference of the antibody treatment with vaccine-induced immune responses. If you have any questions or concerns after the infusion please call the Advanced Practice Provider on call at [phone]. This number is ONLY intended for your use regarding questions or concerns about the infusion post-treatment side-effects.  Please do not provide this number to others for use. For return to work notes please contact your primary care provider.   If someone you know is interested in receiving treatment please have them call the COVID hotline at [phone].   

## 2020-02-17 NOTE — Progress Notes (Signed)
  Diagnosis: COVID-19  Physician: Dr. Wright   Procedure: Covid Infusion Clinic Med: casirivimab\imdevimab infusion - Provided patient with casirivimab\imdevimab fact sheet for patients, parents and caregivers prior to infusion.  Complications: No immediate complications noted.  Discharge: Discharged home   Mark Cabrera  Mark Cabrera 02/17/2020   

## 2020-02-17 NOTE — Progress Notes (Signed)
Based on what you shared with me, I feel your condition warrants further evaluation and I recommend that you be seen for a face to face office visit.  You may require imaging of your lungs.  With symptoms starting 11 days ago, you are outside of the treatment window for the mono-clonal antibody infusion.  I recommend that you be seen in one [place] or [facility] Emergency Department for your persistent shortness of breath.  Please go to an ER now.   NOTE: If you entered your credit card information for this eVisit, you will not be charged. You may see a "hold" on your card for the $35 but that hold will drop off and you will not have a charge processed.    Your e-visit answers were reviewed by a board certified advanced clinical practitioner to complete your personal care plan.  Thank you for using e-Visits.     Approximately 5 minutes was used in reviewing the patient's chart, questionnaire, prescribing medications, and documentation.

## 2020-02-17 NOTE — Telephone Encounter (Signed)
RTC, spoke with the Aunt, [name], who states patient has been bed-bound X 6 days, eaten very little and has had very little to drink.  He has experienced N/V, +chest pain, but states "he always c/o chest pain", febrile with temps spiking to 103.1, shaking, unable to stand, and when he starts to move around in the bed, he starts gasping for breath and becomes diaphoretic.  Aunt states he has an appt for his 1st antibody infusion this morning at 1030. Informed Aunt to call 911 now to have patient evaluated in ED immediately.  She verbalized understanding and is in agreement. SChaplin, RN,BSN

## 2020-02-17 NOTE — Telephone Encounter (Signed)
I received a message from patient's niece to call her Aunt with whom patient is staying. She says her Elenor Legato, Vonna Drafts asked her to call to let us know they think Aidyn Kellis needs to be admitted because he has Covid and is not able to stand, not eating or drinking, he is scheduled for the antibody infusion today. They ask for a call back at [phone]

## 2020-02-17 NOTE — Progress Notes (Signed)
Patient reviewed Fact Sheet for Patients, Parents, and Caregivers for Emergency Use Authorization (EUA) of Casi/Imdevimab for the Treatment of Coronavirus. Patient also reviewed and is agreeable to the estimated cost of treatment. Patient is agreeable to proceed.

## 2020-02-19 ENCOUNTER — Other Ambulatory Visit: Payer: Self-pay

## 2020-02-19 ENCOUNTER — Encounter (HOSPITAL_COMMUNITY): Payer: Self-pay

## 2020-02-19 ENCOUNTER — Emergency Department (HOSPITAL_COMMUNITY): Payer: Medicare Other

## 2020-02-19 ENCOUNTER — Emergency Department (HOSPITAL_COMMUNITY)
Admission: EM | Admit: 2020-02-19 | Discharge: 2020-02-19 | Disposition: A | Discharge status: Home / Self Care | Payer: Medicare Other | Attending: Emergency Medicine | Admitting: Emergency Medicine

## 2020-02-19 DIAGNOSIS — J9811 Atelectasis: Secondary | ICD-10-CM | POA: Diagnosis not present

## 2020-02-19 DIAGNOSIS — R0602 Shortness of breath: Secondary | ICD-10-CM | POA: Diagnosis not present

## 2020-02-19 DIAGNOSIS — I5033 Acute on chronic diastolic (congestive) heart failure: Secondary | ICD-10-CM | POA: Insufficient documentation

## 2020-02-19 DIAGNOSIS — E1149 Type 2 diabetes mellitus with other diabetic neurological complication: Secondary | ICD-10-CM | POA: Insufficient documentation

## 2020-02-19 DIAGNOSIS — I110 Hypertensive heart disease with heart failure: Secondary | ICD-10-CM | POA: Diagnosis not present

## 2020-02-19 DIAGNOSIS — R6889 Other general symptoms and signs: Secondary | ICD-10-CM | POA: Diagnosis not present

## 2020-02-19 DIAGNOSIS — Z951 Presence of aortocoronary bypass graft: Secondary | ICD-10-CM | POA: Diagnosis not present

## 2020-02-19 DIAGNOSIS — Z7984 Long term (current) use of oral hypoglycemic drugs: Secondary | ICD-10-CM | POA: Insufficient documentation

## 2020-02-19 DIAGNOSIS — E1165 Type 2 diabetes mellitus with hyperglycemia: Secondary | ICD-10-CM | POA: Diagnosis not present

## 2020-02-19 DIAGNOSIS — J189 Pneumonia, unspecified organism: Secondary | ICD-10-CM | POA: Diagnosis not present

## 2020-02-19 DIAGNOSIS — I2510 Atherosclerotic heart disease of native coronary artery without angina pectoris: Secondary | ICD-10-CM | POA: Insufficient documentation

## 2020-02-19 DIAGNOSIS — Z794 Long term (current) use of insulin: Secondary | ICD-10-CM | POA: Diagnosis not present

## 2020-02-19 DIAGNOSIS — Z79899 Other long term (current) drug therapy: Secondary | ICD-10-CM | POA: Insufficient documentation

## 2020-02-19 DIAGNOSIS — J90 Pleural effusion, not elsewhere classified: Secondary | ICD-10-CM | POA: Diagnosis not present

## 2020-02-19 DIAGNOSIS — Z8673 Personal history of transient ischemic attack (TIA), and cerebral infarction without residual deficits: Secondary | ICD-10-CM | POA: Diagnosis not present

## 2020-02-19 DIAGNOSIS — R531 Weakness: Secondary | ICD-10-CM | POA: Diagnosis not present

## 2020-02-19 DIAGNOSIS — R059 Cough, unspecified: Secondary | ICD-10-CM | POA: Diagnosis not present

## 2020-02-19 DIAGNOSIS — Z7901 Long term (current) use of anticoagulants: Secondary | ICD-10-CM | POA: Insufficient documentation

## 2020-02-19 DIAGNOSIS — U071 COVID-19: Secondary | ICD-10-CM | POA: Diagnosis not present

## 2020-02-19 DIAGNOSIS — J1282 Pneumonia due to coronavirus disease 2019: Secondary | ICD-10-CM | POA: Diagnosis not present

## 2020-02-19 DIAGNOSIS — Z743 Need for continuous supervision: Secondary | ICD-10-CM | POA: Diagnosis not present

## 2020-02-19 LAB — CBC WITH DIFFERENTIAL/PLATELET
Abs Immature Granulocytes: 0 10*3/uL (ref 0.00–0.07)
Basophils Absolute: 0 10*3/uL (ref 0.0–0.1)
Basophils Relative: 0 %
Eosinophils Absolute: 0 10*3/uL (ref 0.0–0.5)
Eosinophils Relative: 1 %
HCT: 33.3 % — ABNORMAL LOW (ref 39.0–52.0)
Hemoglobin: 11.3 g/dL — ABNORMAL LOW (ref 13.0–17.0)
Lymphocytes Relative: 27 %
Lymphs Abs: 0.4 10*3/uL — ABNORMAL LOW (ref 0.7–4.0)
MCH: 29.9 pg (ref 26.0–34.0)
MCHC: 33.9 g/dL (ref 30.0–36.0)
MCV: 88.1 fL (ref 80.0–100.0)
Monocytes Absolute: 0.1 10*3/uL (ref 0.1–1.0)
Monocytes Relative: 5 %
Neutro Abs: 0.9 10*3/uL — ABNORMAL LOW (ref 1.7–7.7)
Neutrophils Relative %: 67 %
Platelets: 199 10*3/uL (ref 150–400)
RBC: 3.78 MIL/uL — ABNORMAL LOW (ref 4.22–5.81)
RDW: 12.9 % (ref 11.5–15.5)
WBC: 1.4 10*3/uL — CL (ref 4.0–10.5)
nRBC: 0 % (ref 0.0–0.2)

## 2020-02-19 LAB — BASIC METABOLIC PANEL
Anion gap: 14 (ref 5–15)
BUN: 22 mg/dL (ref 8–23)
CO2: 23 mmol/L (ref 22–32)
Calcium: 9 mg/dL (ref 8.9–10.3)
Chloride: 96 mmol/L — ABNORMAL LOW (ref 98–111)
Creatinine, Ser: 1.02 mg/dL (ref 0.61–1.24)
GFR, Estimated: >60 mL/min (ref >60)
Glucose, Bld: 285 mg/dL — ABNORMAL HIGH (ref 70–99)
Potassium: 3.8 mmol/L (ref 3.5–5.1)
Sodium: 133 mmol/L — ABNORMAL LOW (ref 135–145)

## 2020-02-19 LAB — BRAIN NATRIURETIC PEPTIDE: B Natriuretic Peptide: 111.8 pg/mL — ABNORMAL HIGH (ref 0.0–100.0)

## 2020-02-19 MED ORDER — IOHEXOL 350 MG/ML SOLN
100.0000 mL | Freq: Once | INTRAVENOUS | Status: AC | PRN
  Administered 2020-02-19: 100 mL via INTRAVENOUS

## 2020-02-19 MED ORDER — SODIUM CHLORIDE (PF) 0.9 % IJ SOLN
INTRAMUSCULAR | Status: AC
  Filled 2020-02-19: qty 100

## 2020-02-19 MED ORDER — SODIUM CHLORIDE 0.9 % IV SOLN: INTRAVENOUS | Status: DC

## 2020-02-19 NOTE — ED Notes (Signed)
ED Provider at bedside. 

## 2020-02-19 NOTE — ED Triage Notes (Addendum)
Pt BIB EMS from home. Pt states he is COVID+ x10 days. Pt c/o chills, body aches, and cough. Pt c/o rash to back, states it feels like its burning. Pt has hx of PVC's, diabetic.   95% RA CBG 221

## 2020-02-19 NOTE — ED Notes (Signed)
An After Visit Summary was printed and given to the patient. Discharge instructions given and no further questions at this time.  

## 2020-02-19 NOTE — ED Notes (Signed)
As pt requested, this RN called his sister for ride home. Vonna Drafts- sister [phone] stated she will pick him up in 20 min.

## 2020-02-19 NOTE — ED Notes (Signed)
Dr. Zenia Resides aware pt WBC 1.4.

## 2020-02-19 NOTE — ED Provider Notes (Addendum)
[name] DEPT Provider Note   CSN: [id number] Arrival date & time: 02/19/20  1052     History Chief Complaint  Patient presents with  . Covid Positive  . Rash    Mark Cabrera is a 69 y.o. male.  69 year old male who was diagnosed with Covid 10 days ago who presents with worsening shortness of breath as well as weakness.  Patient had a dose of monoclonal antibody few days ago.  States that he has not had any vomiting or diarrhea.  Does note increased dyspnea on exertion.  Feels that he needs home oxygen.  Denies any prior pulmonary history.  Patient also endorses an itchy rash on his back.  Denies any known allergen exposures.  Denies any sensation of throat closing or stridor.        Past Medical History:  Diagnosis Date  . Acute diastolic congestive heart failure ([place])   . Aortic valve stenosis s/p AVR 2018  . Atrial fibrillation ([place]) - post-op CABG    04/2016 CHA2DS2VAS score = 5  . Atypical nevi   . Coronary artery disease s/p 2 vessel CABG   . Depression    "years ago"  . Diabetes mellitus   . Dyspnea    in the past   . GERD (gastroesophageal reflux disease)   . Heart murmur   . Hepatic cirrhosis ([place]) 06/29/2017  . History of kidney stones   . Hyperlipidemia    hx of transaminitis secondary to statin and he has decided not to use statins secondary to potential side effects.  . Hypertension   . Nephrolithiasis   . Osteoarthritis, knee   . Sleep apnea     Central apnea. Not using cpap  . Stroke [facility]) 2007  . Transaminitis     Statin-induced  . 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 2-D echo was normal no embolic source found.    Patient Active  Problem List   Diagnosis Date Noted  . Orthopnea 12/03/2019  . Abdominal wall bulge 12/03/2019  . Pain in left testicle 08/07/2019  . Dermoid inclusion cyst 08/07/2019  . Chronic left shoulder pain 05/13/2019  . Depression 11/05/2018  . Blurry vision, bilateral 09/20/2018  . Rotator cuff syndrome, left   . 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 CVA (cerebral vascular accident) ([place]) 06/28/2018  . Hepatic cirrhosis ([place]) 06/29/2017  . Candida albicans infection 01/09/2017  . Healthcare maintenance 12/16/2016  . Lipoma of left upper extremity 09/12/2016  . Chronic knee pain 07/04/2016  . Decreased visual acuity 07/04/2016  . Peripheral neuropathic pain 05/12/2016  . Pericardial effusion a. subxiphoid pericardial window on 04/21/2016 04/28/2016  . Pleural effusion on left, s/p throacentesis 04/18/16 04/28/2016  . Chest pain   . Paroxysmal atrial fibrillation ([place]) 04/16/2016  . 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  . CAD (coronary artery disease), native coronary artery   . Chronic diastolic heart failure ([place])   . Sleep apnea 08/02/2009  . 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 Dyslipidemia 05/18/2006  . Hypertensive heart disease   . History of dissection of vertebral artery  ([place]) 08/02/2005    Past Surgical History:  Procedure  Laterality Date  . AORTIC VALVE REPLACEMENT N/A 03/30/2016   Procedure: AORTIC VALVE REPLACEMENT (AVR);  Surgeon: [name], MD;  Location: [facility];  Service: Open Heart Surgery;  Laterality: N/A;  . CARDIAC CATHETERIZATION N/A 03/15/2016   Procedure: Right/Left Heart Cath and Coronary Angiography;  Surgeon: Burnell Blanks, MD;  Location: [facility];  Service: Cardiovascular;  Laterality: N/A;  . CLIPPING OF ATRIAL APPENDAGE N/A 03/30/2016   Procedure: CLIPPING OF LEFT ATRIAL APPENDAGE;  Surgeon: [name], MD;  Location: [facility];   Service: Open Heart Surgery;  Laterality: N/A;  . CORONARY ARTERY BYPASS GRAFT N/A 03/30/2016   Procedure: CORONARY ARTERY BYPASS GRAFTING (CABG) Times Two;  Surgeon: [name], MD;  Location: [facility];  Service: Open Heart Surgery;  Laterality: N/A;  . KNEE ARTHROSCOPY W/ PARTIAL MEDIAL MENISCECTOMY  05/12/2005   right, performed by Dr. French Ana for torn medial meniscus.  Marland Kitchen ROTATOR CUFF REPAIR Right 2016  . STERNAL WIRES REMOVAL N/A 08/13/2017   Procedure: STERNAL WIRES REMOVAL;  Surgeon: [name], MD;  Location: [facility];  Service: Thoracic;  Laterality: N/A;  . SUBXYPHOID PERICARDIAL WINDOW N/A 04/21/2016   Procedure: SUBXYPHOID PERICARDIAL WINDOW;  Surgeon: [name], MD;  Location: [facility];  Service: Thoracic;  Laterality: N/A;  . TEE WITHOUT CARDIOVERSION N/A 03/30/2016   Procedure: TRANSESOPHAGEAL ECHOCARDIOGRAM (TEE);  Surgeon: [name], MD;  Location: [facility];  Service: Open Heart Surgery;  Laterality: N/A;  . TEE WITHOUT CARDIOVERSION N/A 04/21/2016   Procedure: TRANSESOPHAGEAL ECHOCARDIOGRAM (TEE);  Surgeon: [name], MD;  Location[facility] OR;  Service: Thoracic;  Laterality: N/A;       Family History  Problem Relation Age of Onset  . Hypertension Mother   . Diabetes Mother   . Alcohol abuse Father     Social History   Tobacco Use  . Smoking status: Never Smoker  . Smokeless tobacco: Never Used  Vaping Use  . Vaping Use: Never used  Substance Use Topics  . Alcohol use: Yes    Alcohol/week: 1.0 standard drink    Types: 1 Cans of beer per week    Comment: occasional  . Drug use: No    Home Medications Prior to Admission medications   Medication Sig Start Date End Date Taking? Authorizing Provider  acetaminophen (TYLENOL) 325 MG tablet Take 2 tablets (650 mg total) by mouth every 4 (four) hours as needed for headache or mild pain. 04/28/16   Isaiah Serge, NP  apixaban (ELIQUIS) 5 MG TABS tablet TAKE 1 TABLET(5 MG) BY MOUTH  TWICE DAILY 12/03/19   [name], MD  Bayer Microlet Lancets lancets Check blood sugar 3 times a day as instructed 05/26/19   Ladona Horns, MD  Bempedoic Acid-Ezetimibe (NEXLIZET) 180-10 MG TABS Take 1 tablet by mouth daily. 05/29/19   Burnell Blanks, MD  clopidogrel (PLAVIX) 75 MG tablet TAKE 1 TABLET(75 MG) BY MOUTH DAILY 12/03/19   [name], MD  Evolocumab 140 MG/ML SOAJ Inject 1 pen into the skin every 14 (fourteen) days. 12/03/19   [name], MD  furosemide (LASIX) 40 MG tablet Take 1 tablet (40 mg total) by mouth daily. 05/08/19 12/03/19  Ladona Horns, MD  glucose blood (CONTOUR NEXT TEST) test strip Check blood sugar 3 times a day as instructed 05/26/19   Ladona Horns, MD  Insulin Glargine-Lixisenatide [facility]) 100-33 UNT-MCG/ML SOPN Inject 40 Units into the skin every morning. 12/03/19 03/02/20  [name], MD  insulin lispro (HUMALOG KWIKPEN) 100 UNIT/ML KwikPen Inject 18 Units into the skin 3 (three) times daily with meals. 12/03/19 03/02/20  [name], MD  Insulin Pen Needle 31G X 5 MM MISC Use one pen needle three times daily. Dx E11.49 05/08/19   Ladona Horns, MD  lisinopril (ZESTRIL) 10 MG tablet Take 1 tablet by mouth once daily 08/21/19   Burnell Blanks, MD  metFORMIN (GLUCOPHAGE) 500 MG tablet TAKE 1 TABLET(500 MG) BY MOUTH TWICE DAILY WITH A MEAL 08/07/19   Ladona Horns, MD  neomycin-polymyxin-hydrocortisone (CORTISPORIN) 3.5-10000-1 OTIC suspension Place 3 drops into the left ear 4 (four) times daily. 05/08/19   Ladona Horns, MD  nitroGLYCERIN (NITROSTAT) 0.4 MG SL tablet PLACE 1 TABLET UNDER THE TONGUE EVERY 5 MINUTES AS NEEDED FOR CHEST PAIN 05/08/19   Ladona Horns, MD  insulin aspart (NOVOLOG) 100 UNIT/ML FlexPen Inject 18 Units into the skin 3 (three) times daily with meals. 09/15/19 12/03/19  Jean Rosenthal, MD    Allergies    Penicillins, Statins, Morphine and related, and Sglt2 inhibitors  Review of Systems   Review of Systems   All other systems reviewed and are negative.   Physical Exam Updated Vital Signs BP (!) 143/70   Pulse 80   Temp 98.5 F (36.9 C) (Oral)   Resp 18   SpO2 98%   Physical Exam Vitals and nursing note reviewed.  Constitutional:      General: He is not in acute distress.    Appearance: Normal appearance. He is well-developed and well-nourished. He is not toxic-appearing.  HENT:     Head: Normocephalic and atraumatic.  Eyes:     General: Lids are normal.     Extraocular Movements: EOM normal.     Conjunctiva/sclera: Conjunctivae normal.     Pupils: Pupils are equal, round, and reactive to light.  Neck:     Thyroid: No thyroid mass.     Trachea: No tracheal deviation.  Cardiovascular:     Rate and Rhythm: Normal rate and regular rhythm.     Heart sounds: Normal heart sounds. No murmur heard. No gallop.   Pulmonary:     Effort: Pulmonary effort is normal. No respiratory distress.     Breath sounds: Normal breath sounds. No stridor. No decreased breath sounds, wheezing, rhonchi or rales.  Abdominal:     General: Bowel sounds are normal. There is no distension.     Palpations: Abdomen is soft.     Tenderness: There is no abdominal tenderness. There is no CVA tenderness or rebound.  Musculoskeletal:        General: No tenderness or edema. Normal range of motion.     Cervical back: Normal range of motion and neck supple.  Skin:    General: Skin is warm and dry.     Findings: Rash present. No abrasion. Rash is urticarial. Rash is not pustular or vesicular.  Neurological:     Mental Status: He is alert and oriented to person, place, and time.     GCS: GCS eye subscore is 4. GCS verbal subscore is 5. GCS motor subscore is 6.     Cranial Nerves: No cranial nerve deficit.     Sensory: No sensory deficit.     Deep Tendon Reflexes: Strength normal.  Psychiatric:        Mood and Affect: Mood and affect normal.        Speech: Speech normal.        Behavior: Behavior normal.  ED Results / Procedures / Treatments   Labs (all labs ordered are listed, but only abnormal results are displayed) Labs Reviewed  CBC WITH DIFFERENTIAL/PLATELET  BASIC METABOLIC PANEL    EKG None  Radiology No results found.  Procedures Procedures (including critical care time)  Medications Ordered in ED Medications  0.9 %  sodium chloride infusion (has no administration in time range)    ED Course  I have reviewed the triage vital signs and the nursing notes.  Pertinent labs & imaging results that were available during my care of the patient were reviewed by me and considered in my medical decision making (see chart for details).    MDM Rules/Calculators/A&P                         Patient's rash likely allergic in etiology.  No evidence of this being zoster.  Patient describes it as being itchy.  Will place on a course of prednisone and instruct patient to take Benadryl. Patient is CT of the chest is negative for PE but does show extensive Covid.  Patient has not had any evidence of hypoxemia here.  Pulse oximetry has been stable.  Does have mild leukopenia which could be related to Covid.  The rest of his CBC shows good hemoglobin and normal platelets.  Have instructed him to follow his doctor for this.  He is comfortable going home Final Clinical Impression(s) / ED Diagnoses Final diagnoses:  None    Rx / DC Orders ED Discharge Orders    None       Lacretia Leigh, MD 02/19/20 1557    Lacretia Leigh, MD 02/19/20 1559

## 2020-02-19 NOTE — ED Notes (Signed)
Pt ambulated around room with a walker (pt uses cane at home at baseline). Pt ambulated with a steady gait unassisted at 95% RA.

## 2020-02-22 ENCOUNTER — Other Ambulatory Visit: Payer: Self-pay

## 2020-02-22 ENCOUNTER — Inpatient Hospital Stay (HOSPITAL_COMMUNITY)
Admission: EM | Admit: 2020-02-22 | Discharge: 2020-02-24 | DRG: 177 | Disposition: A | Discharge status: Home / Self Care | Payer: Medicare Other | Attending: Internal Medicine | Admitting: Internal Medicine

## 2020-02-22 ENCOUNTER — Emergency Department (HOSPITAL_COMMUNITY): Payer: Medicare Other

## 2020-02-22 ENCOUNTER — Encounter (HOSPITAL_COMMUNITY): Payer: Self-pay | Admitting: *Deleted

## 2020-02-22 DIAGNOSIS — Z8249 Family history of ischemic heart disease and other diseases of the circulatory system: Secondary | ICD-10-CM

## 2020-02-22 DIAGNOSIS — U071 COVID-19: Principal | ICD-10-CM

## 2020-02-22 DIAGNOSIS — R45851 Suicidal ideations: Secondary | ICD-10-CM

## 2020-02-22 DIAGNOSIS — Z79899 Other long term (current) drug therapy: Secondary | ICD-10-CM

## 2020-02-22 DIAGNOSIS — E1169 Type 2 diabetes mellitus with other specified complication: Secondary | ICD-10-CM

## 2020-02-22 DIAGNOSIS — K5900 Constipation, unspecified: Secondary | ICD-10-CM | POA: Diagnosis present

## 2020-02-22 DIAGNOSIS — G4730 Sleep apnea, unspecified: Secondary | ICD-10-CM | POA: Diagnosis present

## 2020-02-22 DIAGNOSIS — Z7901 Long term (current) use of anticoagulants: Secondary | ICD-10-CM

## 2020-02-22 DIAGNOSIS — E871 Hypo-osmolality and hyponatremia: Secondary | ICD-10-CM | POA: Diagnosis present

## 2020-02-22 DIAGNOSIS — I5032 Chronic diastolic (congestive) heart failure: Secondary | ICD-10-CM | POA: Diagnosis not present

## 2020-02-22 DIAGNOSIS — D649 Anemia, unspecified: Secondary | ICD-10-CM | POA: Diagnosis present

## 2020-02-22 DIAGNOSIS — Z8673 Personal history of transient ischemic attack (TIA), and cerebral infarction without residual deficits: Secondary | ICD-10-CM

## 2020-02-22 DIAGNOSIS — Z7902 Long term (current) use of antithrombotics/antiplatelets: Secondary | ICD-10-CM

## 2020-02-22 DIAGNOSIS — R42 Dizziness and giddiness: Secondary | ICD-10-CM | POA: Diagnosis present

## 2020-02-22 DIAGNOSIS — Z951 Presence of aortocoronary bypass graft: Secondary | ICD-10-CM

## 2020-02-22 DIAGNOSIS — J9601 Acute respiratory failure with hypoxia: Secondary | ICD-10-CM | POA: Diagnosis not present

## 2020-02-22 DIAGNOSIS — Z87442 Personal history of urinary calculi: Secondary | ICD-10-CM

## 2020-02-22 DIAGNOSIS — Z7984 Long term (current) use of oral hypoglycemic drugs: Secondary | ICD-10-CM

## 2020-02-22 DIAGNOSIS — K219 Gastro-esophageal reflux disease without esophagitis: Secondary | ICD-10-CM | POA: Diagnosis present

## 2020-02-22 DIAGNOSIS — J1282 Pneumonia due to coronavirus disease 2019: Secondary | ICD-10-CM | POA: Diagnosis not present

## 2020-02-22 DIAGNOSIS — Z888 Allergy status to other drugs, medicaments and biological substances status: Secondary | ICD-10-CM

## 2020-02-22 DIAGNOSIS — R0602 Shortness of breath: Secondary | ICD-10-CM | POA: Diagnosis not present

## 2020-02-22 DIAGNOSIS — F32A Depression, unspecified: Secondary | ICD-10-CM | POA: Diagnosis present

## 2020-02-22 DIAGNOSIS — E785 Hyperlipidemia, unspecified: Secondary | ICD-10-CM | POA: Diagnosis present

## 2020-02-22 DIAGNOSIS — I110 Hypertensive heart disease with heart failure: Secondary | ICD-10-CM | POA: Diagnosis present

## 2020-02-22 DIAGNOSIS — Z833 Family history of diabetes mellitus: Secondary | ICD-10-CM

## 2020-02-22 DIAGNOSIS — Z66 Do not resuscitate: Secondary | ICD-10-CM | POA: Diagnosis present

## 2020-02-22 DIAGNOSIS — Z885 Allergy status to narcotic agent status: Secondary | ICD-10-CM

## 2020-02-22 DIAGNOSIS — Z952 Presence of prosthetic heart valve: Secondary | ICD-10-CM

## 2020-02-22 DIAGNOSIS — Z8679 Personal history of other diseases of the circulatory system: Secondary | ICD-10-CM

## 2020-02-22 DIAGNOSIS — E119 Type 2 diabetes mellitus without complications: Secondary | ICD-10-CM | POA: Diagnosis not present

## 2020-02-22 DIAGNOSIS — I517 Cardiomegaly: Secondary | ICD-10-CM | POA: Diagnosis not present

## 2020-02-22 DIAGNOSIS — I2510 Atherosclerotic heart disease of native coronary artery without angina pectoris: Secondary | ICD-10-CM | POA: Diagnosis present

## 2020-02-22 DIAGNOSIS — Z794 Long term (current) use of insulin: Secondary | ICD-10-CM

## 2020-02-22 DIAGNOSIS — K7460 Unspecified cirrhosis of liver: Secondary | ICD-10-CM | POA: Diagnosis present

## 2020-02-22 DIAGNOSIS — Z91138 Patient's unintentional underdosing of medication regimen for other reason: Secondary | ICD-10-CM

## 2020-02-22 DIAGNOSIS — D72818 Other decreased white blood cell count: Secondary | ICD-10-CM | POA: Diagnosis present

## 2020-02-22 DIAGNOSIS — R9431 Abnormal electrocardiogram [ECG] [EKG]: Secondary | ICD-10-CM | POA: Diagnosis not present

## 2020-02-22 DIAGNOSIS — T383X6A Underdosing of insulin and oral hypoglycemic [antidiabetic] drugs, initial encounter: Secondary | ICD-10-CM | POA: Diagnosis present

## 2020-02-22 DIAGNOSIS — I4891 Unspecified atrial fibrillation: Secondary | ICD-10-CM | POA: Diagnosis present

## 2020-02-22 DIAGNOSIS — Z880 Allergy status to penicillin: Secondary | ICD-10-CM

## 2020-02-22 DIAGNOSIS — E1165 Type 2 diabetes mellitus with hyperglycemia: Secondary | ICD-10-CM | POA: Diagnosis present

## 2020-02-22 LAB — URINALYSIS, ROUTINE W REFLEX MICROSCOPIC
Bacteria, UA: NONE SEEN
Bilirubin Urine: NEGATIVE
Glucose, UA: >=500 mg/dL — AB
Hgb urine dipstick: NEGATIVE
Ketones, ur: 20 mg/dL — AB
Leukocytes,Ua: NEGATIVE
Nitrite: NEGATIVE
Protein, ur: NEGATIVE mg/dL
Specific Gravity, Urine: 1.021 (ref 1.005–1.030)
pH: 5 (ref 5.0–8.0)

## 2020-02-22 LAB — HEPATIC FUNCTION PANEL
ALT: 25 U/L (ref 0–44)
AST: 22 U/L (ref 15–41)
Albumin: 2.4 g/dL — ABNORMAL LOW (ref 3.5–5.0)
Alkaline Phosphatase: 70 U/L (ref 38–126)
Bilirubin, Direct: 0.3 mg/dL — ABNORMAL HIGH (ref 0.0–0.2)
Indirect Bilirubin: 1.1 mg/dL — ABNORMAL HIGH (ref 0.3–0.9)
Total Bilirubin: 1.4 mg/dL — ABNORMAL HIGH (ref 0.3–1.2)
Total Protein: 7.3 g/dL (ref 6.5–8.1)

## 2020-02-22 LAB — BASIC METABOLIC PANEL
Anion gap: 16 — ABNORMAL HIGH (ref 5–15)
BUN: 18 mg/dL (ref 8–23)
CO2: 21 mmol/L — ABNORMAL LOW (ref 22–32)
Calcium: 8.3 mg/dL — ABNORMAL LOW (ref 8.9–10.3)
Chloride: 94 mmol/L — ABNORMAL LOW (ref 98–111)
Creatinine, Ser: 1.06 mg/dL (ref 0.61–1.24)
GFR, Estimated: >60 mL/min (ref >60)
Glucose, Bld: 202 mg/dL — ABNORMAL HIGH (ref 70–99)
Potassium: 3.5 mmol/L (ref 3.5–5.1)
Sodium: 131 mmol/L — ABNORMAL LOW (ref 135–145)

## 2020-02-22 LAB — CBC
HCT: 29.2 % — ABNORMAL LOW (ref 39.0–52.0)
Hemoglobin: 10.2 g/dL — ABNORMAL LOW (ref 13.0–17.0)
MCH: 31 pg (ref 26.0–34.0)
MCHC: 34.9 g/dL (ref 30.0–36.0)
MCV: 88.8 fL (ref 80.0–100.0)
Platelets: 381 10*3/uL (ref 150–400)
RBC: 3.29 MIL/uL — ABNORMAL LOW (ref 4.22–5.81)
RDW: 12.8 % (ref 11.5–15.5)
WBC: 1.9 10*3/uL — ABNORMAL LOW (ref 4.0–10.5)
nRBC: 0 % (ref 0.0–0.2)

## 2020-02-22 LAB — TROPONIN I (HIGH SENSITIVITY)
Troponin I (High Sensitivity): 23 ng/L — ABNORMAL HIGH (ref <18)
Troponin I (High Sensitivity): 21 ng/L — ABNORMAL HIGH (ref <18)

## 2020-02-22 LAB — LACTIC ACID, PLASMA
Lactic Acid, Venous: 2.3 mmol/L (ref 0.5–1.9)
Lactic Acid, Venous: 1.9 mmol/L (ref 0.5–1.9)

## 2020-02-22 LAB — CBG MONITORING, ED
Glucose-Capillary: 296 mg/dL — ABNORMAL HIGH (ref 70–99)
Glucose-Capillary: 378 mg/dL — ABNORMAL HIGH (ref 70–99)
Glucose-Capillary: 424 mg/dL — ABNORMAL HIGH (ref 70–99)

## 2020-02-22 LAB — PROCALCITONIN: Procalcitonin: 1.35 ng/mL

## 2020-02-22 LAB — D-DIMER, QUANTITATIVE: D-Dimer, Quant: 3.63 ug/mL-FEU — ABNORMAL HIGH (ref 0.00–0.50)

## 2020-02-22 LAB — TRIGLYCERIDES: Triglycerides: 103 mg/dL (ref <150)

## 2020-02-22 LAB — FIBRINOGEN: Fibrinogen: >800 mg/dL — ABNORMAL HIGH (ref 210–475)

## 2020-02-22 LAB — HEMOGLOBIN A1C
Hgb A1c MFr Bld: 9.4 % — ABNORMAL HIGH (ref 4.8–5.6)
Mean Plasma Glucose: 223.08 mg/dL

## 2020-02-22 LAB — LACTATE DEHYDROGENASE: LDH: 241 U/L — ABNORMAL HIGH (ref 98–192)

## 2020-02-22 LAB — FERRITIN: Ferritin: 639 ng/mL — ABNORMAL HIGH (ref 24–336)

## 2020-02-22 LAB — LIPASE, BLOOD: Lipase: 20 U/L (ref 11–51)

## 2020-02-22 LAB — C-REACTIVE PROTEIN: CRP: 34.5 mg/dL — ABNORMAL HIGH (ref <1.0)

## 2020-02-22 MED ORDER — INSULIN GLARGINE 100 UNIT/ML ~~LOC~~ SOLN
20.0000 [IU] | Freq: Every day | SUBCUTANEOUS | Status: DC
  Administered 2020-02-22: 21:00:00 20 [IU] via SUBCUTANEOUS
  Filled 2020-02-22 (×3): qty 0.2

## 2020-02-22 MED ORDER — INSULIN ASPART 100 UNIT/ML ~~LOC~~ SOLN
6.0000 [IU] | Freq: Three times a day (TID) | SUBCUTANEOUS | Status: DC
  Administered 2020-02-22 – 2020-02-23 (×2): 6 [IU] via SUBCUTANEOUS

## 2020-02-22 MED ORDER — INSULIN ASPART 100 UNIT/ML ~~LOC~~ SOLN
0.0000 [IU] | Freq: Three times a day (TID) | SUBCUTANEOUS | Status: DC
  Administered 2020-02-22: 19:00:00 8 [IU] via SUBCUTANEOUS
  Administered 2020-02-23 (×3): 15 [IU] via SUBCUTANEOUS

## 2020-02-22 MED ORDER — DEXAMETHASONE 6 MG PO TABS
6.0000 mg | ORAL_TABLET | ORAL | Status: DC
  Administered 2020-02-23 – 2020-02-24 (×2): 6 mg via ORAL
  Filled 2020-02-22 (×2): qty 1

## 2020-02-22 MED ORDER — EZETIMIBE 10 MG PO TABS
10.0000 mg | ORAL_TABLET | Freq: Every day | ORAL | Status: DC
  Administered 2020-02-22 – 2020-02-24 (×3): 10 mg via ORAL
  Filled 2020-02-22 (×4): qty 1

## 2020-02-22 MED ORDER — GUAIFENESIN-DM 100-10 MG/5ML PO SYRP: 10.0000 mL | ORAL_SOLUTION | ORAL | Status: DC | PRN

## 2020-02-22 MED ORDER — SODIUM CHLORIDE 0.9% FLUSH
3.0000 mL | Freq: Two times a day (BID) | INTRAVENOUS | Status: DC
  Administered 2020-02-23 – 2020-02-24 (×3): 3 mL via INTRAVENOUS

## 2020-02-22 MED ORDER — ALBUTEROL SULFATE HFA 108 (90 BASE) MCG/ACT IN AERS
2.0000 | INHALATION_SPRAY | Freq: Four times a day (QID) | RESPIRATORY_TRACT | Status: DC
  Administered 2020-02-22 – 2020-02-24 (×6): 2 via RESPIRATORY_TRACT
  Filled 2020-02-22: qty 6.7

## 2020-02-22 MED ORDER — APIXABAN 5 MG PO TABS
5.0000 mg | ORAL_TABLET | Freq: Two times a day (BID) | ORAL | Status: DC
  Administered 2020-02-22 – 2020-02-24 (×4): 5 mg via ORAL
  Filled 2020-02-22 (×4): qty 1

## 2020-02-22 MED ORDER — ACETAMINOPHEN 500 MG PO TABS
1000.0000 mg | ORAL_TABLET | Freq: Once | ORAL | Status: AC
  Administered 2020-02-22: 13:00:00 1000 mg via ORAL
  Filled 2020-02-22: qty 2

## 2020-02-22 MED ORDER — CLOPIDOGREL BISULFATE 75 MG PO TABS
75.0000 mg | ORAL_TABLET | Freq: Every day | ORAL | Status: DC
  Administered 2020-02-22 – 2020-02-24 (×3): 75 mg via ORAL
  Filled 2020-02-22 (×3): qty 1

## 2020-02-22 MED ORDER — DEXAMETHASONE SODIUM PHOSPHATE 10 MG/ML IJ SOLN
10.0000 mg | Freq: Once | INTRAMUSCULAR | Status: AC
  Administered 2020-02-22: 13:00:00 10 mg via INTRAVENOUS
  Filled 2020-02-22: qty 1

## 2020-02-22 MED ORDER — LISINOPRIL 10 MG PO TABS
10.0000 mg | ORAL_TABLET | Freq: Every day | ORAL | Status: DC
  Administered 2020-02-22 – 2020-02-24 (×3): 10 mg via ORAL
  Filled 2020-02-22 (×3): qty 1

## 2020-02-22 MED ORDER — POLYETHYLENE GLYCOL 3350 17 G PO PACK
17.0000 g | PACK | Freq: Every day | ORAL | Status: DC | PRN
  Administered 2020-02-24: 06:00:00 17 g via ORAL
  Filled 2020-02-22: qty 1

## 2020-02-22 MED ORDER — HYDROCOD POLST-CPM POLST ER 10-8 MG/5ML PO SUER
5.0000 mL | Freq: Two times a day (BID) | ORAL | Status: DC | PRN
Start: 2020-02-22 — End: 2020-02-24

## 2020-02-22 NOTE — ED Provider Notes (Signed)
Emergency Department Provider Note   I have reviewed the triage vital signs and the nursing notes.   HISTORY  Chief Complaint No chief complaint on file.   HPI Mark Cabrera is a 69 y.o. male with PMH reviewed below including COVID 19 diagnosis on 12/3 with 13 days of symptoms at this point returns to the emergency department with fatigue, shortness of breath, generalized weakness.  Patient received monoclonal antibody therapy earlier in the course of illness.  He had an ED visit on 12/9 with similar symptoms.  He had a CTA at that time which did not show PE but was positive for COVID PNA.  He was not hypoxemic at that time and ultimately discharged home.  Patient states his shortness of breath has increased.  He has minimal, central chest tightness without radiation.  No pleuritic chest pain.  Denies abdominal pain.  He is not having significant vomiting or diarrhea.  No radiation of symptoms or other modifying factors.  Past Medical History:  Diagnosis Date  . Acute diastolic congestive heart failure ([place])   . Aortic valve stenosis s/p AVR 2018  . Atrial fibrillation ([place]) - post-op CABG    04/2016 CHA2DS2VAS score = 5  . Atypical nevi   . Coronary artery disease s/p 2 vessel CABG   . Depression    "years ago"  . Diabetes mellitus   . Dyspnea    in the past   . GERD (gastroesophageal reflux disease)   . Heart murmur   . Hepatic cirrhosis ([place]) 06/29/2017  . History of kidney stones   . Hyperlipidemia    hx of transaminitis secondary to statin and he has decided not to use statins secondary to potential side effects.  . Hypertension   . Nephrolithiasis   . Osteoarthritis, knee   . Sleep apnea     Central apnea. Not using cpap  . Stroke [facility]) 2007  . Transaminitis     Statin-induced  . 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 MRI of the brain 2007 showed acute left lateral medullary infarct in the  distribution of left posterior inferior cerebral artery , narrowing of the left vertebral with severe diminution of flow or acute occlusion. 2-D echo was normal no embolic source found.    Patient Active Problem List   Diagnosis Date Noted  . Pneumonia due to COVID-19 virus 02/22/2020  . Orthopnea 12/03/2019  . Abdominal wall bulge 12/03/2019  . Pain in left testicle 08/07/2019  . Dermoid inclusion cyst 08/07/2019  . Chronic left shoulder pain 05/13/2019  . Depression 11/05/2018  . Blurry vision, bilateral 09/20/2018  . Rotator cuff syndrome, left   . 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 CVA (cerebral vascular accident) ([place]) 06/28/2018  . Hepatic cirrhosis ([place]) 06/29/2017  . Candida albicans infection 01/09/2017  . Healthcare maintenance 12/16/2016  . Lipoma of left upper extremity 09/12/2016  . Chronic knee pain 07/04/2016  . Decreased visual acuity 07/04/2016  . Peripheral neuropathic pain 05/12/2016  . Pericardial effusion a. subxiphoid pericardial window on 04/21/2016 04/28/2016  . Pleural effusion on left, s/p throacentesis 04/18/16 04/28/2016  . Chest pain   . Paroxysmal atrial fibrillation ([place]) 04/16/2016  . 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  . CAD (coronary artery disease), native coronary artery   . Chronic diastolic heart failure ([place])   . Sleep apnea 08/02/2009  . 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 Dyslipidemia 05/18/2006  . Hypertensive heart disease   . History of dissection of vertebral artery  ([place]) 08/02/2005    Past Surgical History:  Procedure Laterality Date  . AORTIC VALVE REPLACEMENT N/A 03/30/2016   Procedure: AORTIC VALVE REPLACEMENT (AVR);  Surgeon: [name], MD;  Location: [place];  Service: Open Heart Surgery;  Laterality: N/A;  . CARDIAC CATHETERIZATION N/A 03/15/2016   Procedure: Right/Left Heart Cath and Coronary Angiography;  Surgeon:  Burnell Blanks, MD;  Location: [place] [facility];  Service: Cardiovascular;  Laterality: N/A;  . CLIPPING OF ATRIAL APPENDAGE N/A 03/30/2016   Procedure: CLIPPING OF LEFT ATRIAL APPENDAGE;  Surgeon: [name], MD;  Location: [place];  Service: Open Heart Surgery;  Laterality: N/A;  . CORONARY ARTERY BYPASS GRAFT N/A 03/30/2016   Procedure: CORONARY ARTERY BYPASS GRAFTING (CABG) Times Two;  Surgeon: [name], MD;  Location: [place];  Service: Open Heart Surgery;  Laterality: N/A;  . KNEE ARTHROSCOPY W/ PARTIAL MEDIAL MENISCECTOMY  05/12/2005   right, performed by Dr. French Ana for torn medial meniscus.  Marland Kitchen ROTATOR CUFF REPAIR Right 2016  . STERNAL WIRES REMOVAL N/A 08/13/2017   Procedure: STERNAL WIRES REMOVAL;  Surgeon: [name], MD;  Location: [place];  Service: Thoracic;  Laterality: N/A;  . SUBXYPHOID PERICARDIAL WINDOW N/A 04/21/2016   Procedure: SUBXYPHOID PERICARDIAL WINDOW;  Surgeon: [name], MD;  Location: [place];  Service: Thoracic;  Laterality: N/A;  . TEE WITHOUT CARDIOVERSION N/A 03/30/2016   Procedure: TRANSESOPHAGEAL ECHOCARDIOGRAM (TEE);  Surgeon: [name], MD;  Location: [place];  Service: Open Heart Surgery;  Laterality: N/A;  . TEE WITHOUT CARDIOVERSION N/A 04/21/2016   Procedure: TRANSESOPHAGEAL ECHOCARDIOGRAM (TEE);  Surgeon: [name], MD;  Location: [facility] OR;  Service: Thoracic;  Laterality: N/A;    Allergies Morpholine salicylate, Penicillins, Morphine and related, Sglt2 inhibitors, and Statins  Family History  Problem Relation Age of Onset  . Hypertension Mother   . Diabetes Mother   . Alcohol abuse Father     Social History Social History   Tobacco Use  . Smoking status: Never Smoker  . Smokeless tobacco: Never Used  Vaping Use  . Vaping Use: Never used  Substance Use Topics  . Alcohol use: Yes    Alcohol/week: 1.0 standard drink    Types: 1 Cans of beer per week    Comment: occasional  . Drug  use: No    Review of Systems  Constitutional: Positive fever/chills and weakness.  Eyes: No visual changes. ENT: No sore throat. Cardiovascular: Mild central chest pain. Respiratory: Positive shortness of breath and cough.  Gastrointestinal: No abdominal pain.  No nausea, no vomiting.  No diarrhea.  No constipation. Genitourinary: Negative for dysuria. Musculoskeletal: Negative for back pain. Skin: Negative for rash. Neurological: Negative for headaches, focal weakness or numbness.  10-point ROS otherwise negative.  ____________________________________________   PHYSICAL EXAM:  VITAL SIGNS: ED Triage Vitals  Enc Vitals Group     BP 02/22/20 0702 (!) 114/56     Pulse Rate 02/22/20 1009 91     Resp 02/22/20 0702 (!) 24     Temp 02/22/20 0702 (!) 100.4 F (38 C)     Temp Source 02/22/20 0702 Oral     SpO2 02/22/20 0702 91 %     Weight 02/22/20 0658 220 lb 10.9 oz (100.1 kg)     Height 02/22/20 0658 5\' 8"  (1.727 m)  Constitutional: Alert and oriented. Well appearing and in no acute distress. Eyes: Conjunctivae are normal.  Head: Atraumatic. Nose: No congestion/rhinnorhea. Mouth/Throat: Mucous membranes are moist.   Neck: No stridor.  Cardiovascular: Normal rate, regular rhythm. Good peripheral circulation. Grossly normal heart sounds.   Respiratory: Increased respiratory effort.  No retractions. Lungs with crackles at the bases.  Gastrointestinal: Soft and nontender. No distention.  Musculoskeletal: No lower extremity tenderness nor edema. No gross deformities of extremities. Neurologic:  Normal speech and language. No gross focal neurologic deficits are appreciated.  Skin:  Skin is warm, dry and intact. No rash noted.  ____________________________________________   LABS (all labs ordered are listed, but only abnormal results are displayed)  Labs Reviewed  BASIC METABOLIC PANEL - Abnormal; Notable for the following components:      Result Value   Sodium 131 (*)     Chloride 94 (*)    CO2 21 (*)    Glucose, Bld 202 (*)    Calcium 8.3 (*)    Anion gap 16 (*)    All other components within normal limits  CBC - Abnormal; Notable for the following components:   WBC 1.9 (*)    RBC 3.29 (*)    Hemoglobin 10.2 (*)    HCT 29.2 (*)    All other components within normal limits  LACTIC ACID, PLASMA - Abnormal; Notable for the following components:   Lactic Acid, Venous 2.3 (*)    All other components within normal limits  D-DIMER, QUANTITATIVE (NOT AT [facility]) - Abnormal; Notable for the following components:   D-Dimer, Quant 3.63 (*)    All other components within normal limits  LACTATE DEHYDROGENASE - Abnormal; Notable for the following components:   LDH 241 (*)    All other components within normal limits  FERRITIN - Abnormal; Notable for the following components:   Ferritin 639 (*)    All other components within normal limits  FIBRINOGEN - Abnormal; Notable for the following components:   Fibrinogen >800 (*)    All other components within normal limits  C-REACTIVE PROTEIN - Abnormal; Notable for the following components:   CRP 34.5 (*)    All other components within normal limits  HEPATIC FUNCTION PANEL - Abnormal; Notable for the following components:   Albumin 2.4 (*)    Total Bilirubin 1.4 (*)    Bilirubin, Direct 0.3 (*)    Indirect Bilirubin 1.1 (*)    All other components within normal limits  URINALYSIS, ROUTINE W REFLEX MICROSCOPIC - Abnormal; Notable for the following components:   Glucose, UA >=500 (*)    Ketones, ur 20 (*)    All other components within normal limits  HEMOGLOBIN A1C - Abnormal; Notable for the following components:   Hgb A1c MFr Bld 9.4 (*)    All other components within normal limits  GLUCOSE, CAPILLARY - Abnormal; Notable for the following components:   Glucose-Capillary 365 (*)    All other components within normal limits  CBG MONITORING, ED - Abnormal; Notable for the following components:    Glucose-Capillary 296 (*)    All other components within normal limits  CBG MONITORING, ED - Abnormal; Notable for the following components:   Glucose-Capillary 378 (*)    All other components within normal limits  CBG MONITORING, ED - Abnormal; Notable for the following components:   Glucose-Capillary 424 (*)    All other components within normal limits  TROPONIN I (HIGH SENSITIVITY) - Abnormal; Notable for the following components:  Troponin I (High Sensitivity) 23 (*)    All other components within normal limits  TROPONIN I (HIGH SENSITIVITY) - Abnormal; Notable for the following components:   Troponin I (High Sensitivity) 21 (*)    All other components within normal limits  CULTURE, BLOOD (ROUTINE X 2)  CULTURE, BLOOD (ROUTINE X 2)  LACTIC ACID, PLASMA  PROCALCITONIN  LIPASE, BLOOD  TRIGLYCERIDES  CBC WITH DIFFERENTIAL/PLATELET  COMPREHENSIVE METABOLIC PANEL  C-REACTIVE PROTEIN  D-DIMER, QUANTITATIVE (NOT AT [facility])   ____________________________________________  EKG   EKG Interpretation  Date/Time:  Sunday February 22 2020 06:46:01 EST Ventricular Rate:  105 PR Interval:  208 QRS Duration: 118 QT Interval:  348 QTC Calculation: 459 R Axis:   -48 Text Interpretation: Sinus tachycardia with occasional Premature ventricular complexes Left anterior fascicular block Septal infarct , age undetermined ST & T wave abnormality, consider lateral ischemia Abnormal ECG No significant change since last tracing No STEMI Confirmed by Nanda Quinton [phone]) on 02/22/2020 11:57:25 AM Also confirmed by Nanda Quinton [phone]), editor Hattie Perch ([id number])  on 02/22/2020 3:04:35 PM       ____________________________________________  RADIOLOGY  DG Chest Portable 1 View  Result Date: 02/22/2020 CLINICAL DATA:  Shortness of breath EXAM: PORTABLE CHEST 1 VIEW COMPARISON:  02/19/2020 FINDINGS: Status post aortic valve replacement, CABG, and left atrial appendage clipping. Stable mild  cardiomegaly. Atherosclerotic calcification of the aortic knob. Persistent patchy airspace opacities, predominantly within the peripheral aspects of the mid to lower lung fields bilaterally. Findings are similar in appearance to the prior study. No large pleural fluid collection. No pneumothorax. IMPRESSION: No significant interval change in bilateral patchy airspace opacities. Electronically Signed   By: Davina Poke D.O.   On: 02/22/2020 12:57    ____________________________________________   PROCEDURES  Procedure(s) performed:   .Critical Care Performed by: Margette Fast, MD Authorized by: Margette Fast, MD   Critical care provider statement:    Critical care time (minutes):  35   Critical care time was exclusive of:  Separately billable procedures and treating other patients and teaching time   Critical care was necessary to treat or prevent imminent or life-threatening deterioration of the following conditions:  Respiratory failure   Critical care was time spent personally by me on the following activities:  Discussions with consultants, evaluation of patient's response to treatment, examination of patient, ordering and performing treatments and interventions, ordering and review of laboratory studies, ordering and review of radiographic studies, pulse oximetry, re-evaluation of patient's condition, obtaining history from patient or surrogate, review of old charts, blood draw for specimens and development of treatment plan with patient or surrogate   I assumed direction of critical care for this patient from another provider in my specialty: no       ____________________________________________   INITIAL IMPRESSION / ASSESSMENT AND PLAN / ED COURSE  Pertinent labs & imaging results that were available during my care of the patient were reviewed by me and considered in my medical decision making (see chart for details).   Patient presents emergency department with worsening  COVID-19 symptoms.  He is hypoxemic here. Labs and imaging reviewed.  Patient with unchanged infiltrates on chest x-ray.  Do not plan on repeat CTA of the chest disease had one within the past several days.  Gave Decadron and will continue to provide supportive care in the emergency department.  My suspicion for PE, dissection, ACS is very low.   Discussed patient's case with Medicine service to request admission. Patient  and family (if present) updated with plan. Care transferred to Medicine service.  I reviewed all nursing notes, vitals, pertinent old records, EKGs, labs, imaging (as available).  ____________________________________________  FINAL CLINICAL IMPRESSION(S) / ED DIAGNOSES  Final diagnoses:  Acute respiratory failure with hypoxia ([place])  COVID-19     MEDICATIONS GIVEN DURING THIS VISIT:  Medications  sodium chloride flush (NS) 0.9 % injection 3 mL (3 mLs Intravenous Not Given 02/23/20 0113)  albuterol (VENTOLIN HFA) 108 (90 Base) MCG/ACT inhaler 2 puff (2 puffs Inhalation Not Given 02/23/20 0300)  dexamethasone (DECADRON) tablet 6 mg (has no administration in time range)  guaiFENesin-dextromethorphan (ROBITUSSIN DM) 100-10 MG/5ML syrup 10 mL (has no administration in time range)  chlorpheniramine-HYDROcodone (TUSSIONEX) 10-8 MG/5ML suspension 5 mL (has no administration in time range)  polyethylene glycol (MIRALAX / GLYCOLAX) packet 17 g (has no administration in time range)  apixaban (ELIQUIS) tablet 5 mg (5 mg Oral Given 02/22/20 2242)  clopidogrel (PLAVIX) tablet 75 mg (75 mg Oral Given 02/22/20 1831)  lisinopril (ZESTRIL) tablet 10 mg (10 mg Oral Given 02/22/20 1831)  ezetimibe (ZETIA) tablet 10 mg (10 mg Oral Given 02/22/20 2110)  insulin aspart (novoLOG) injection 0-15 Units (8 Units Subcutaneous Given 02/22/20 1831)  insulin glargine (LANTUS) injection 20 Units (20 Units Subcutaneous Given 02/22/20 2108)  insulin aspart (novoLOG) injection 6 Units (6 Units  Subcutaneous Given 02/22/20 2108)  acetaminophen (TYLENOL) tablet 1,000 mg (1,000 mg Oral Given 02/22/20 1253)  dexamethasone (DECADRON) injection 10 mg (10 mg Intravenous Given 02/22/20 1251)   Note:  This document was prepared using Dragon voice recognition software and may include unintentional dictation errors.  Nanda Quinton, MD, [facility] Emergency Medicine    Shaley Leavens, [facility], MD 02/23/20 [phone]

## 2020-02-22 NOTE — H&P (Addendum)
Date: 02/22/2020               Patient Name:  Mark Cabrera MRN: [id number]  DOB: 1951-02-27 Age / Sex: 69 y.o., male   PCP: [name], MD         Medical Service: Internal Medicine Teaching Service         Attending Physician: Dr. Velna Ochs, MD    First Contact: Dr. Konrad Penta Pager: [phone]  Second Contact: Dr. Charleen Kirks Pager: [phone]       After Hours (After 5p/  First Contact Pager: [phone]  weekends / holidays): Second Contact Pager: [phone]   Chief Complaint: Diaphoresis  History of Present Illness:   Mark Cabrera is a 69 y.o. M w/ PMHx CAD s/p CABG, AS s/p AVR, Atrial Fibrillation, Cirrhosis (per chart review although patient denies this), Vertebral Artery Dissection, Sleep Apnea, GERD, HTN, HLD, nephrolitiasis, and depression, who presented to the ED with COVID-19 pneumonia. He states that his primary symptoms are diaphoresis and dizziness. He first noticed these symptoms about 11 days ago, and tested positive for COVID-19 December 3rd. He does endorse mild shortness of breath that has significantly improved with oxygen in the ED. His sister bought him a pulse ox for home and noticed his oxygen saturations dropping and told him to come to the hospital for evaluation. He endorses red urine over the past couple of weeks. He also notes recent constipation, and stated that he had some blood mixed in his stool after giving himself a rectal suppository. He has had mild left-sided abdominal pain but no other history of rectal bleeding. He endorses subjective fevers and chills, but denies nausea, vomiting, LE swelling, CP or any other symptoms.    Home Medications: Tylenol 650mg  4 hours PRN  Eliquis 5mg  twice daily BC Headache Powder PO PRN  Nexlizet daily  Plavix 75mg  daily Ezetimibe 10mg  daily  Novolog 18 units three times daily w/ meals Soliqua 40 units every morning  Lisinopril 10mg  daily  Metformin 500mg  twice daily w/ meals  NTG 0.4mg  SL PRN  Evolocumab  140mg  every other week  Allergies: Allergies as of 02/22/2020 - Review Complete 02/22/2020  Allergen Reaction Noted   Morpholine salicylate Other (See Comments) 10/16/2019   Penicillins Other (See Comments) 10/16/2019   Morphine and related Other (See Comments) 04/18/2016   Sglt2 inhibitors Rash and Other (See Comments) 04/12/2018   Statins Rash and Other (See Comments) 04/18/2016   Past Medical History:  Diagnosis Date   Acute diastolic congestive heart failure (H[facility])    Aortic valve stenosis s/p AVR 2018   Atrial fibrillation ([facility]) - post-op CABG    04/2016 CHA2DS2VAS score = 5   Atypical nevi    Coronary artery disease s/p 2 vessel CABG    Depression    "years ago"   Diabetes mellitus    Dyspnea    in the past    GERD (gastroesophageal reflux disease)    Heart murmur    Hepatic cirrhosis ([place]) 06/29/2017   History of kidney stones    Hyperlipidemia    hx of transaminitis secondary to statin and he has decided not to use statins secondary to potential side effects.   Hypertension    Nephrolithiasis    Osteoarthritis, knee    Sleep apnea     Central apnea. 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 2-D echo was normal no embolic source found.   Family History:  Family History  Problem Relation Age of Onset   Hypertension Mother    Diabetes Mother    Alcohol abuse Father    Social History:  Patient is an Training and development officer.  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  Denies any tobacco use or drug use.  Endorses occasional alcohol use. Denies history of heavy alcohol use or history of withdrawal from alcohol.   Review of  Systems: A complete ROS was negative except as per HPI.   Physical Exam: Blood pressure 116/65, pulse 66, temperature (!) 100.4 F (38 C), temperature source Oral, resp. rate 14, height 5\' 8"  (1.727 m), weight 100.1 kg, SpO2 97 %.  General: Patient appears chronically ill and overweight. No acute distress.  Eyes: Sclera non-icteric. No conjunctival injection.  HENT: Moist mucus membranes. No nasal discharge. Respiratory: Lungs are CTA, bilaterally. No wheezes, rales, or rhonchi.  Cardiovascular: Regular rate and rhythm. No murmurs, rubs, or gallops. No lower extremity edema. Abdominal: Soft and non-tender to palpation. Bowel sounds intact. No rebound or guarding. Neurological: Alert and oriented.  Musculoskeletal: Normal muscle bulk and tone.  Skin: No lesions. No rashes. Skin appears jaundiced. Psych: Normal affect. Normal tone of voice.   EKG: personally reviewed my interpretation is NSR at 71bpm with IVCD and 1st degree AV block. No evidence of ischemia.   CXR: personally reviewed my interpretation is bilateral patchy airspace opacities consistent with COVID-19 PNA.   Assessment & Plan by Problem: Active Problems:   Pneumonia due to COVID-19 virus  # COVID-19 Pneumonia  Patient initially febrile with temperature 100.4 degrees, tachypneic, saturating at 91% on [place]. Labs show leukopenia with WBC 1.4, troponin 23 > 21, lactic acid 2.3, CRP 34.5, LDL 241, Ferritin 639, Fibrinogen >800, D-dimer 3.63, procalcitonin 1.35. CXR shows bilateral patchy airspace opacities consistent with PNA.  - Decadron 10mg  IV once  - Decadron 6mg  PO x 10 days - Will require 21 day quarantine (until Dec. 24, 2021)  - Baracitinib and remdesivir not indicated - Will hold ABX for now - Blood cultures pending   # Uncontrolled Type II DM  Blood glucose elevated in the 200's. Patient takes Novolog 18 units WC and Lantus 40 units in the mornings, although states he occasionally misses Novolog dosing. Likely  elevated in reaction to infection and now steroids. - Check Hb A1c  - Start moderate SSI  - Novolog 6 units TID WC - Lantus 20 units nightly  - Continue CBG monitoring   # Mixed Hyperbilirubinemia Mild elevated bilirubin levels. Likely due to COVID-19 infection vs. Underlying alcoholic liver disease. - Continue to monitor daily CMP's   # Chronic Normocytic Anemia  Hemoglobin not much lower than baseline today.  - Continue to monitor morning CBC's   # Leukopenia WBC as low as 1.4 with lymphopenia and neutropenia. Very likely due to COVID-19. - Continue to monitor CBC's  # Hyponatremia  Sodium 131, chloride 21. Likely due to poor PO intake.  - Continue to monitor   # Hypertension Blood pressures under good control.  - Continue home lisinopril 10mg  daily   # HLD # Hx Vertebral Artery Dissection - Continue ezetimibe 10mg  daily  - Continue Apixaban 5mg   twice daily  - Continue Plavix 75mg  daily   # Constipation - Miralax PRN   Dispo: Admit patient to Inpatient with expected length of stay greater than 2 midnights.  Signed: Jeralyn Bennett, MD 02/22/2020, 8:27 PM  Pager: [phone] After 5pm on weekdays and 1pm on weekends: On Call pager: [phone]

## 2020-02-23 ENCOUNTER — Other Ambulatory Visit: Payer: Self-pay

## 2020-02-23 DIAGNOSIS — E1165 Type 2 diabetes mellitus with hyperglycemia: Secondary | ICD-10-CM | POA: Diagnosis present

## 2020-02-23 DIAGNOSIS — E785 Hyperlipidemia, unspecified: Secondary | ICD-10-CM | POA: Diagnosis present

## 2020-02-23 DIAGNOSIS — K7460 Unspecified cirrhosis of liver: Secondary | ICD-10-CM | POA: Diagnosis present

## 2020-02-23 DIAGNOSIS — R45851 Suicidal ideations: Secondary | ICD-10-CM

## 2020-02-23 DIAGNOSIS — T383X6A Underdosing of insulin and oral hypoglycemic [antidiabetic] drugs, initial encounter: Secondary | ICD-10-CM | POA: Diagnosis present

## 2020-02-23 DIAGNOSIS — I4891 Unspecified atrial fibrillation: Secondary | ICD-10-CM | POA: Diagnosis present

## 2020-02-23 DIAGNOSIS — K5900 Constipation, unspecified: Secondary | ICD-10-CM | POA: Diagnosis present

## 2020-02-23 DIAGNOSIS — J1282 Pneumonia due to coronavirus disease 2019: Secondary | ICD-10-CM | POA: Diagnosis present

## 2020-02-23 DIAGNOSIS — Z952 Presence of prosthetic heart valve: Secondary | ICD-10-CM | POA: Diagnosis not present

## 2020-02-23 DIAGNOSIS — I5032 Chronic diastolic (congestive) heart failure: Secondary | ICD-10-CM | POA: Diagnosis present

## 2020-02-23 DIAGNOSIS — I110 Hypertensive heart disease with heart failure: Secondary | ICD-10-CM | POA: Diagnosis present

## 2020-02-23 DIAGNOSIS — R42 Dizziness and giddiness: Secondary | ICD-10-CM | POA: Diagnosis present

## 2020-02-23 DIAGNOSIS — U071 COVID-19: Principal | ICD-10-CM

## 2020-02-23 DIAGNOSIS — J9601 Acute respiratory failure with hypoxia: Secondary | ICD-10-CM

## 2020-02-23 DIAGNOSIS — E871 Hypo-osmolality and hyponatremia: Secondary | ICD-10-CM | POA: Diagnosis present

## 2020-02-23 DIAGNOSIS — G4730 Sleep apnea, unspecified: Secondary | ICD-10-CM | POA: Diagnosis present

## 2020-02-23 DIAGNOSIS — D72818 Other decreased white blood cell count: Secondary | ICD-10-CM | POA: Diagnosis present

## 2020-02-23 DIAGNOSIS — Z951 Presence of aortocoronary bypass graft: Secondary | ICD-10-CM | POA: Diagnosis not present

## 2020-02-23 DIAGNOSIS — Z91138 Patient's unintentional underdosing of medication regimen for other reason: Secondary | ICD-10-CM | POA: Diagnosis not present

## 2020-02-23 DIAGNOSIS — I2510 Atherosclerotic heart disease of native coronary artery without angina pectoris: Secondary | ICD-10-CM | POA: Diagnosis present

## 2020-02-23 DIAGNOSIS — D649 Anemia, unspecified: Secondary | ICD-10-CM | POA: Diagnosis present

## 2020-02-23 DIAGNOSIS — K219 Gastro-esophageal reflux disease without esophagitis: Secondary | ICD-10-CM | POA: Diagnosis present

## 2020-02-23 DIAGNOSIS — Z66 Do not resuscitate: Secondary | ICD-10-CM | POA: Diagnosis present

## 2020-02-23 DIAGNOSIS — F32A Depression, unspecified: Secondary | ICD-10-CM | POA: Diagnosis present

## 2020-02-23 LAB — CBC WITH DIFFERENTIAL/PLATELET
Abs Immature Granulocytes: 0.01 10*3/uL (ref 0.00–0.07)
Basophils Absolute: 0 10*3/uL (ref 0.0–0.1)
Basophils Relative: 0 %
Eosinophils Absolute: 0 10*3/uL (ref 0.0–0.5)
Eosinophils Relative: 0 %
HCT: 26.2 % — ABNORMAL LOW (ref 39.0–52.0)
Hemoglobin: 8.9 g/dL — ABNORMAL LOW (ref 13.0–17.0)
Immature Granulocytes: 0 %
Lymphocytes Relative: 12 %
Lymphs Abs: 0.3 10*3/uL — ABNORMAL LOW (ref 0.7–4.0)
MCH: 29.6 pg (ref 26.0–34.0)
MCHC: 34 g/dL (ref 30.0–36.0)
MCV: 87 fL (ref 80.0–100.0)
Monocytes Absolute: 0.1 10*3/uL (ref 0.1–1.0)
Monocytes Relative: 3 %
Neutro Abs: 2.2 10*3/uL (ref 1.7–7.7)
Neutrophils Relative %: 85 %
Platelets: 436 10*3/uL — ABNORMAL HIGH (ref 150–400)
RBC: 3.01 MIL/uL — ABNORMAL LOW (ref 4.22–5.81)
RDW: 12.5 % (ref 11.5–15.5)
WBC: 2.6 10*3/uL — ABNORMAL LOW (ref 4.0–10.5)
nRBC: 0 % (ref 0.0–0.2)

## 2020-02-23 LAB — COMPREHENSIVE METABOLIC PANEL
ALT: 22 U/L (ref 0–44)
AST: 18 U/L (ref 15–41)
Albumin: 2.3 g/dL — ABNORMAL LOW (ref 3.5–5.0)
Alkaline Phosphatase: 69 U/L (ref 38–126)
Anion gap: 12 (ref 5–15)
BUN: 29 mg/dL — ABNORMAL HIGH (ref 8–23)
CO2: 21 mmol/L — ABNORMAL LOW (ref 22–32)
Calcium: 8.5 mg/dL — ABNORMAL LOW (ref 8.9–10.3)
Chloride: 96 mmol/L — ABNORMAL LOW (ref 98–111)
Creatinine, Ser: 1.14 mg/dL (ref 0.61–1.24)
GFR, Estimated: >60 mL/min (ref >60)
Glucose, Bld: 410 mg/dL — ABNORMAL HIGH (ref 70–99)
Potassium: 4 mmol/L (ref 3.5–5.1)
Sodium: 129 mmol/L — ABNORMAL LOW (ref 135–145)
Total Bilirubin: 0.8 mg/dL (ref 0.3–1.2)
Total Protein: 7.3 g/dL (ref 6.5–8.1)

## 2020-02-23 LAB — C-REACTIVE PROTEIN: CRP: 28.4 mg/dL — ABNORMAL HIGH (ref <1.0)

## 2020-02-23 LAB — GLUCOSE, CAPILLARY
Glucose-Capillary: 365 mg/dL — ABNORMAL HIGH (ref 70–99)
Glucose-Capillary: 409 mg/dL — ABNORMAL HIGH (ref 70–99)
Glucose-Capillary: 433 mg/dL — ABNORMAL HIGH (ref 70–99)
Glucose-Capillary: 405 mg/dL — ABNORMAL HIGH (ref 70–99)

## 2020-02-23 LAB — D-DIMER, QUANTITATIVE: D-Dimer, Quant: 3.2 ug/mL-FEU — ABNORMAL HIGH (ref 0.00–0.50)

## 2020-02-23 MED ORDER — INSULIN ASPART 100 UNIT/ML ~~LOC~~ SOLN
10.0000 [IU] | Freq: Three times a day (TID) | SUBCUTANEOUS | Status: DC
  Administered 2020-02-23: 14:00:00 10 [IU] via SUBCUTANEOUS

## 2020-02-23 MED ORDER — INSULIN ASPART 100 UNIT/ML ~~LOC~~ SOLN
18.0000 [IU] | Freq: Three times a day (TID) | SUBCUTANEOUS | Status: DC
  Administered 2020-02-23 – 2020-02-24 (×2): 18 [IU] via SUBCUTANEOUS

## 2020-02-23 MED ORDER — INSULIN ASPART 100 UNIT/ML ~~LOC~~ SOLN
0.0000 [IU] | Freq: Three times a day (TID) | SUBCUTANEOUS | Status: DC
  Administered 2020-02-24: 09:00:00 9 [IU] via SUBCUTANEOUS

## 2020-02-23 MED ORDER — INSULIN GLARGINE 100 UNIT/ML ~~LOC~~ SOLN
30.0000 [IU] | Freq: Every day | SUBCUTANEOUS | Status: DC
  Administered 2020-02-23: 22:00:00 30 [IU] via SUBCUTANEOUS
  Filled 2020-02-23 (×2): qty 0.3

## 2020-02-23 MED ORDER — ESCITALOPRAM OXALATE 10 MG PO TABS
5.0000 mg | ORAL_TABLET | Freq: Every day | ORAL | Status: DC
  Administered 2020-02-23: 22:00:00 5 mg via ORAL
  Filled 2020-02-23: qty 1

## 2020-02-23 NOTE — Progress Notes (Signed)
Patient experienced two episodes of hyperglycemia today. Patient did not exhibit any hyperglycemic symptoms. Paged MD both times.   Lunchtime CBG 409. Pt received 25 units (15 sliding + 10 meal coverage).  Dinnertime CBG 433.  MD changed meal coverage order. Patient received 33 units (15 sliding + 18 meal coverage).

## 2020-02-23 NOTE — Progress Notes (Signed)
   Subjective:   Mark Cabrera states that his breathing and diaphoresis have improved. He is no longer febrile, and breathing comfortably on room air. Overnight, he endorsed suicidal ideation and was placed with 1:1 sitter. He states that "you need something to hope for to keep going".   Objective:  Vital signs in last 24 hours: Vitals:   02/23/20 0441 02/23/20 0759 02/23/20 0826 02/23/20 1151  BP: 124/77 129/67 136/75 133/76  Pulse: 66 86  90  Resp: 17 19  20   Temp: 98.1 F (36.7 C) 97.6 F (36.4 C)  97.7 F (36.5 C)  TempSrc: Oral Oral  Oral  SpO2: 96% 93%  93%  Weight:      Height:       General: Patient appears chronically ill although resting comfortably in no acute distress.  Cardiology: RRR, no murmurs, rubs or gallops.  Respiratory: Lung sounds CTA, bilaterally. No wheezing, rales, rhonchi.  Abdominal: Soft, non-tender, non-distended. External rectal exam showed a thrombosed hemorrhoid without fissure, mass, or active bleeding.  Skin: Warm, dry, appears jaundiced.   Assessment/Plan:  Active Problems:   Pneumonia due to COVID-19 virus  # COVID-19 Pneumonia  Patient initially febrile with temperature 100.4 degrees, although fever and tachypnea have resolved and patient has been saturating 93% on room air, in no acute distress. Initially received Decadron 10mg  IV once.   - Continue Decadron PO (day 2/10)  - Hold Baracitinib and remdesivir (not indicated)  - 21 Day quarantine (until 03/05/20)  - Blood cultures no growth   # Uncontrolled Type II DM Blood sugars continue to increase, up to 400's. Hb A1c 9.4. Takes Lantus 40 units nightly and Novolog 18 units three times daily with meals at home although occasionally misses novolog dosing. Steroids likely contributing.   - Increase Lantus 20 units to 30 units nightly  - Increase Novolog to 10 units three times daily with meals - Continue moderate correction coverage w/ meals  - Continue CBG monitoring   # Suicidal  Ideation Overnight, patient endorsed suicidal ideation, stating he has had SI for 10 years. He previously took Prozac and other medications, but discontinued due to racing thoughts.  - Continue 1:1 with sitter  - Psychiatry consulted   # Chronic Normocytic Anemia  Hemoglobin remains stable. External rectal exam performed without active signs of bleeding.   - Continue to monitor CBC   # Hyponatremia  Sodium remains stable, although in the setting of elevated glucose.   - Continue to monitor CMP   # Mixed Hyperbilirubinemia, Resolved  Mild elevated bilirubin levels. Likely due to COVID-19 infection vs. Underlying alcoholic liver disease. - Continue to monitor daily CMP's   # Hypertension  - Continue home lisinopril 10mg  daily   # HLD # Hx Vertebral Artery Dissection - Continue ezetimibe 10mg  daily  - Continue Apixaban 5mg  twice daily  - Continue Plavix 75mg  daily   Prior to Admission Living Arrangement: Home Anticipated Discharge Location: Home Barriers to Discharge: Psychiatric Evaluation, Hyperglycemia  Dispo: Anticipated discharge pending psychiatric evaluation.   Code Status: DNR   Jeralyn Bennett, MD 02/23/2020, 3:30 PM Pager: [phone] After 5pm on weekdays and 1pm on weekends: On Call pager [phone]

## 2020-02-23 NOTE — Progress Notes (Signed)
CSW received request to contact patient's daughter as she requested assistance with patient's discharge plan. CSW spoke with patient's daughter, [name]. She reported that patient requires help with his medications and that the woman he has a room with is also COVID positive and he may not be able to return if he cannot care for himself. CSW explained that if the patient is able to ambulate without much assistance then he would not qualify for rehab. An assisted living facility sounds optimal for him, but he would be unable to go while COVID+. CSW emailed her list of ALF's but she reported that she got patient into one before but he checked himself out and believed they were stealing from him. CSW explained that the hospital cannot force him to stay somewhere if he is not agreeable. She shared that patient has always had thoughts of not wanting to live and that he does not have a formal psych diagnoses, though when hospitalized before someone suggested Depression, which she does not believe is the whole issue. She had pursued Guardianship of patient but decided not to go through with it. She is concerned patient will not take his medications. CSW can arrange home health services a few times a week, but patient can refuse them once he is home. She requested CSW and other staff members see if he can be convinced to go to an ALF, but again, he would not be able to go until through [place] isolation period.   Keiran Gaffey LCSW

## 2020-02-23 NOTE — Progress Notes (Addendum)
Paged by Melburn Hake about patient having suicidal ideation with plan. Spoke with patient. He states that he has been having suicidal ideation for about 10 years now. Reports that he used to see someone for this and was prescribed prozac for it which helped but caused him to "run at night." He did not like this feeling so he stopped taking it. He reports having this same sensation with the rest of the medications he tried as well (unable to tell me the names of the other medications). When asked what has stopped him from going through with the act in the past, he states that it is his love for painting, states that he is very spiritual and reports that he was put here on [place] to create things like his paintings. He does feel depressed at this time. When asked about a plan, he reports "there is nothing in this room that I can kill myself with." He denies visual, auditory, or tactile hallucinations at this time. He denies homicidal ideation at this time. I asked him to inform the nurse if he has any more thoughts of harming himself, to which he agrees. C-SSRS showing patient to be "high-risk" so will place psych consult and suicide precautions with 1:1 sitter.  Plan: - suicide precautions with 1:1 sitter - psych consult placed

## 2020-02-23 NOTE — Consult Note (Signed)
Telepsych Consultation   Reason for Consult:  Suicidal ideation  Referring Physician:  Dr. Allyson Sabal Location of Patient: [phone] Location of Provider: [facility]  Patient Identification: Mark Cabrera MRN:  [id number] Principal Diagnosis: <principal problem not specified> Diagnosis:  Active Problems:   Pneumonia due to COVID-19 virus   Total Time spent with patient: 30 minutes  Subjective:   Mark Cabrera is a 69 y.o. male patient admitted with  COVID 19 pneumonia. Psych consult placed for suicidal ideation with a plan. Patient admits to having passive suicidal ideations for the past 10 years. He endorses some depressive symptoms to include isolation, withdrawn, sadness, anxiety, poor appetite, and suicidal thoughts. He reports him thoughts wax and wane at times, and today he is fine. He reports a long standing history of depression and suicidal thoughts, although he reports never acting on them they are just thoughts. He reports previously taking Prozac for many years, and stopped taking it under the discretion of his provider. He reports he has not seen anyone in over 10 years, although he shows some insight and regard to his mental health. He appears open to therapy and outpatient psychiatry. " So you want me to see a shrink? They are both the same a shrink and a psychiatrist are the same. But ok I will see one. " He also multiple protective factors to include religious preferences, family, sense of love for himself and responsibility. He appears to have a lot of faith and motivation in healing. He denies suicidal thoughts at this time, and is able to contract for safety.   HPI:  Mark Cabrera is a 69 y.o. M w/ PMHx CAD s/p CABG, AS s/p AVR, Atrial Fibrillation, Cirrhosis (per chart review although patient denies this), Vertebral Artery Dissection, Sleep Apnea, GERD, HTN, HLD, nephrolitiasis, and depression, who presented to the ED with COVID-19 pneumonia. He states that his primary  symptoms are diaphoresis and dizziness. He first noticed these symptoms about 11 days ago, and tested positive for COVID-19 December 3rd. He does endorse mild shortness of breath that has significantly improved with oxygen in the ED. His sister bought him a pulse ox for home and noticed his oxygen saturations dropping and told him to come to the hospital for evaluation. He endorses red urine over the past couple of weeks. He also notes recent constipation, and stated that he had some blood mixed in his stool after giving himself a rectal suppository. He has had mild left-sided abdominal pain but no other history of rectal bleeding. He endorses subjective fevers and chills, but denies nausea, vomiting, LE swelling, CP or any other symptoms.   Past Psychiatric History:   Risk to Self:  Denies Risk to Others:  Denies Prior Inpatient Therapy:  Denies Prior Outpatient Therapy:  Over 10 years ago, was taking ProzAC.   Past Medical History:  Past Medical History:  Diagnosis Date   Acute diastolic congestive heart failure (HCC)    Aortic valve stenosis s/p AVR 2018   Atrial fibrillation (HCC) - post-op CABG    04/2016 CHA2DS2VAS score = 5   Atypical nevi    Coronary artery disease s/p 2 vessel CABG    Depression    "years ago"   Diabetes mellitus    Dyspnea    in the past    GERD (gastroesophageal reflux disease)    Heart murmur    Hepatic cirrhosis ([place]) 06/29/2017   History of kidney stones    Hyperlipidemia    hx  of transaminitis secondary to statin and he has decided not to use statins secondary to potential side effects.   Hypertension    Nephrolithiasis    Osteoarthritis, knee    Sleep apnea     Central apnea. 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 MRI of the brain 2007 showed acute left lateral medullary infarct in the  distribution of left posterior inferior cerebral artery , narrowing of the left vertebral with severe diminution of flow or acute occlusion. 2-D echo was normal no embolic source found.    Past Surgical History:  Procedure Laterality Date   AORTIC VALVE REPLACEMENT N/A 03/30/2016   Procedure: AORTIC VALVE REPLACEMENT (AVR);  Surgeon: [name], MD;  Location: [facility];  Service: Open Heart Surgery;  Laterality: N/A;   CARDIAC CATHETERIZATION N/A 03/15/2016   Procedure: Right/Left Heart Cath and Coronary Angiography;  Surgeon: Burnell Blanks, MD;  Location: [facility];  Service: Cardiovascular;  Laterality: N/A;   CLIPPING OF ATRIAL APPENDAGE N/A 03/30/2016   Procedure: CLIPPING OF LEFT ATRIAL APPENDAGE;  Surgeon: [name], MD;  Location: [facility];  Service: Open Heart Surgery;  Laterality: N/A;   CORONARY ARTERY BYPASS GRAFT N/A 03/30/2016   Procedure: CORONARY ARTERY BYPASS GRAFTING (CABG) Times Two;  Surgeon: [name], MD;  Location: [facility] [place];  Service: Open Heart Surgery;  Laterality: N/A;   KNEE ARTHROSCOPY W/ PARTIAL MEDIAL MENISCECTOMY  05/12/2005   right, performed by Dr. French Ana for torn medial meniscus.   ROTATOR CUFF REPAIR Right 2016   STERNAL WIRES REMOVAL N/A 08/13/2017   Procedure: STERNAL WIRES REMOVAL;  Surgeon: [name], MD;  Location: [facility];  Service: Thoracic;  Laterality: N/A;   SUBXYPHOID PERICARDIAL WINDOW N/A 04/21/2016   Procedure: SUBXYPHOID PERICARDIAL WINDOW;  Surgeon: [name], MD;  Location: [facility];  Service: Thoracic;  Laterality: N/A;   TEE WITHOUT CARDIOVERSION N/A 03/30/2016   Procedure: TRANSESOPHAGEAL ECHOCARDIOGRAM (TEE);  Surgeon: [name], MD;  Location: [facility];  Service: Open Heart Surgery;  Laterality: N/A;   TEE WITHOUT CARDIOVERSION N/A 04/21/2016   Procedure: TRANSESOPHAGEAL ECHOCARDIOGRAM (TEE);  Surgeon: [name], MD;  Location: [facility];  Service: Thoracic;   Laterality: N/A;   Family History:  Family History  Problem Relation Age of Onset   Hypertension Mother    Diabetes Mother    Alcohol abuse Father    Family Psychiatric  History: Denies Social History:  Social History   Substance and Sexual Activity  Alcohol Use Yes   Alcohol/week: 1.0 standard drink   Types: 1 Cans of beer per week   Comment: occasional     Social History   Substance and Sexual Activity  Drug Use No    Social History   Socioeconomic History   Marital status: Divorced    Spouse name: Not on file   Number of children: 1   Years of education: 2y college   Highest education level: Not on file  Occupational History   Occupation:  Medicaid /disability    Employer: UNEMPLOYED    Comment: following stroke in 2007  Tobacco Use   Smoking status: Never Smoker   Smokeless tobacco: Never Used  Scientific laboratory technician Use: Never used  Substance and Sexual Activity   Alcohol use: Yes    Alcohol/week:  1.0 standard drink    Types: 1 Cans of beer per week    Comment: occasional   Drug use: No   Sexual activity: Not Currently  Other Topics Concern   Not on file  Social History Narrative   Single but has a girlfriend.    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       Patient has medications disability post stroke.            Social Determinants of Health   Financial Resource Strain: Not on file  Food Insecurity: Not on file  Transportation Needs: Not on file  Physical Activity: Not on file  Stress: Not on file  Social Connections: Not on file   Additional Social History:    Allergies:   Allergies  Allergen Reactions   Morpholine Salicylate Other (See Comments)    Hallucinations   Penicillins Other (See Comments)    Allergic- reaction?? Has patient had a PCN reaction causing immediate rash, facial/tongue/throat swelling, SOB or lightheadedness with hypotension: Unk Has patient had a PCN  reaction causing severe rash involving mucus membranes or skin necrosis: Unk Has patient had a PCN reaction that required hospitalization: Unk Has patient had a PCN reaction occurring within the last 10 years: No If all of the above answers are "NO", then may proceed with Cephalosporin use.  Other reaction(s): Unknown   Morphine And Related Other (See Comments)    Hallucinations    Sglt2 Inhibitors Rash and Other (See Comments)    Candidiasis infection prone   Statins Rash and Other (See Comments)    Severe rash and back pain, and muscle pain    Labs:  Results for orders placed or performed during the hospital encounter of 02/22/20 (from the past 48 hour(s))  Basic metabolic panel     Status: Abnormal   Collection Time: 02/21/20  7:10 PM  Result Value Ref Range   Sodium 131 (L) 135 - 145 mmol/L   Potassium 3.5 3.5 - 5.1 mmol/L   Chloride 94 (L) 98 - 111 mmol/L   CO2 21 (L) 22 - 32 mmol/L   Glucose, Bld 202 (H) 70 - 99 mg/dL    Comment: Glucose reference range applies only to samples taken after fasting for at least 8 hours.   BUN 18 8 - 23 mg/dL   Creatinine, Ser 1.06 0.61 - 1.24 mg/dL   Calcium 8.3 (L) 8.9 - 10.3 mg/dL   GFR, Estimated >60 >60 mL/min    Comment: (NOTE) Calculated using the CKD-EPI Creatinine Equation (2021)    Anion gap 16 (H) 5 - 15    Comment: Performed at [facility] [address], [place], [place] [address]  CBC     Status: Abnormal   Collection Time: 02/21/20  7:10 PM  Result Value Ref Range   WBC 1.9 (L) 4.0 - 10.5 K/uL   RBC 3.29 (L) 4.22 - 5.81 MIL/uL   Hemoglobin 10.2 (L) 13.0 - 17.0 g/dL   HCT 29.2 (L) 39.0 - 52.0 %   MCV 88.8 80.0 - 100.0 fL   MCH 31.0 26.0 - 34.0 pg   MCHC 34.9 30.0 - 36.0 g/dL   RDW 12.8 11.5 - 15.5 %   Platelets 381 150 - 400 K/uL   nRBC 0.0 0.0 - 0.2 %    Comment: Performed at [facility], [place] [address] [place], [place] [address]  Troponin I (High Sensitivity)  Status: Abnormal   Collection  Time: 02/21/20  7:10 PM  Result Value Ref Range   Troponin I (High Sensitivity) 23 (H) <18 ng/L    Comment: (NOTE) Elevated high sensitivity troponin I (hsTnI) values and significant  changes across serial measurements may suggest ACS but many other  chronic and acute conditions are known to elevate hsTnI results.  Refer to the "Links" section for chest pain algorithms and additional  guidance. Performed at [facility], [place] [address], [place], [place] [address]   Hemoglobin A1c     Status: Abnormal   Collection Time: 02/21/20  7:10 PM  Result Value Ref Range   Hgb A1c MFr Bld 9.4 (H) 4.8 - 5.6 %    Comment: (NOTE) Pre diabetes:          5.7%-6.4%  Diabetes:              >6.4%  Glycemic control for   <7.0% adults with diabetes    Mean Plasma Glucose 223.08 mg/dL    Comment: Performed at [facility] [address] [place], [place] [address]  D-dimer, quantitative     Status: Abnormal   Collection Time: 02/22/20 12:24 PM  Result Value Ref Range   D-Dimer, Quant 3.63 (H) 0.00 - 0.50 ug/mL-FEU    Comment: (NOTE) At the manufacturer cut-off value of 0.5 g/mL FEU, this assay has a negative predictive value of 95-100%.This assay is intended for use in conjunction with a clinical pretest probability (PTP) assessment model to exclude pulmonary embolism (PE) and deep venous thrombosis (DVT) in outpatients suspected of PE or DVT. Results should be correlated with clinical presentation. Performed at [facility], [place] [address], [place], [place] [address]   Procalcitonin     Status: None   Collection Time: 02/22/20 12:24 PM  Result Value Ref Range   Procalcitonin 1.35 ng/mL    Comment:        Interpretation: PCT > 0.5 ng/mL and <= 2 ng/mL: Systemic infection (sepsis) is possible, but other conditions are known to elevate PCT as well. (NOTE)       Sepsis PCT Algorithm           Lower Respiratory Tract                                      Infection PCT  Algorithm    ----------------------------     ----------------------------         PCT < 0.25 ng/mL                PCT < 0.10 ng/mL          Strongly encourage             Strongly discourage   discontinuation of antibiotics    initiation of antibiotics    ----------------------------     -----------------------------       PCT 0.25 - 0.50 ng/mL            PCT 0.10 - 0.25 ng/mL               OR       >80% decrease in PCT            Discourage initiation of  antibiotics      Encourage discontinuation           of antibiotics    ----------------------------     -----------------------------         PCT >= 0.50 ng/mL              PCT 0.26 - 0.50 ng/mL                AND       <80% decrease in PCT             Encourage initiation of                                             antibiotics       Encourage continuation           of antibiotics    ----------------------------     -----------------------------        PCT >= 0.50 ng/mL                  PCT > 0.50 ng/mL               AND         increase in PCT                  Strongly encourage                                      initiation of antibiotics    Strongly encourage escalation           of antibiotics                                     -----------------------------                                           PCT <= 0.25 ng/mL                                                 OR                                        > 80% decrease in PCT                                      Discontinue / Do not initiate                                             antibiotics  Performed at [facility], [place] [address] [place], [place] [address]   Lactate dehydrogenase     Status: Abnormal   Collection Time:  02/22/20 12:24 PM  Result Value Ref Range   LDH 241 (H) 98 - 192 U/L    Comment: Performed at [facility], [place] [address], [place], [place] [address]  Ferritin     Status: Abnormal    Collection Time: 02/22/20 12:24 PM  Result Value Ref Range   Ferritin 639 (H) 24 - 336 ng/mL    Comment: Performed at [facility], [place] [address], [place], [place] [address]  Fibrinogen     Status: Abnormal   Collection Time: 02/22/20 12:24 PM  Result Value Ref Range   Fibrinogen >800 (H) 210 - 475 mg/dL    Comment: Performed at [facility] [address], [place], [place] [address]  C-reactive protein     Status: Abnormal   Collection Time: 02/22/20 12:24 PM  Result Value Ref Range   CRP 34.5 (H) <1.0 mg/dL    Comment: Performed at [facility], [place] [address], [place], [place] [address]  Hepatic function panel     Status: Abnormal   Collection Time: 02/22/20 12:24 PM  Result Value Ref Range   Total Protein 7.3 6.5 - 8.1 g/dL   Albumin 2.4 (L) 3.5 - 5.0 g/dL   AST 22 15 - 41 U/L   ALT 25 0 - 44 U/L   Alkaline Phosphatase 70 38 - 126 U/L   Total Bilirubin 1.4 (H) 0.3 - 1.2 mg/dL   Bilirubin, Direct 0.3 (H) 0.0 - 0.2 mg/dL   Indirect Bilirubin 1.1 (H) 0.3 - 0.9 mg/dL    Comment: Performed at [facility] [address] [place], [place] [address]  Lipase, blood     Status: None   Collection Time: 02/22/20 12:24 PM  Result Value Ref Range   Lipase 20 11 - 51 U/L    Comment: Performed at [facility], [place] [address], [place], [place] [address]  Triglycerides     Status: None   Collection Time: 02/22/20 12:24 PM  Result Value Ref Range   Triglycerides 103 <150 mg/dL    Comment: Performed at [facility] [address], [place], [place] [address]  Troponin I (High Sensitivity)     Status: Abnormal   Collection Time: 02/22/20 12:24 PM  Result Value Ref Range   Troponin I (High Sensitivity) 21 (H) <18 ng/L    Comment: (NOTE) Elevated high sensitivity troponin I (hsTnI) values and significant  changes across serial measurements may suggest ACS but many other  chronic and acute conditions are known to elevate hsTnI results.  Refer to the  "Links" section for chest pain algorithms and additional  guidance. Performed at [facility], [place] [address], [place], [place] [address]   Lactic acid, plasma     Status: Abnormal   Collection Time: 02/22/20 12:48 PM  Result Value Ref Range   Lactic Acid, Venous 2.3 (HH) 0.5 - 1.9 mmol/L    Comment: CRITICAL RESULT CALLED TO, READ BACK BY AND VERIFIED WITH: GEE GEE LASHER,RN AT 1408 02/22/2020 BY ZBEECH. Performed at [facility], [place] [address], [place], [place] [address]   Blood Culture (routine x 2)     Status: None (Preliminary result)   Collection Time: 02/22/20 12:48 PM   Specimen: BLOOD  Result Value Ref Range   Specimen Description BLOOD SITE NOT SPECIFIED    Special Requests      BOTTLES DRAWN AEROBIC AND ANAEROBIC Blood Culture adequate volume   Culture  NO GROWTH < 24 HOURS Performed at [facility] [address] [place], [place] [address]    Report Status PENDING   Blood Culture (routine x 2)     Status: None (Preliminary result)   Collection Time: 02/22/20 12:48 PM   Specimen: BLOOD  Result Value Ref Range   Specimen Description BLOOD SITE NOT SPECIFIED    Special Requests      BOTTLES DRAWN AEROBIC AND ANAEROBIC Blood Culture adequate volume   Culture      NO GROWTH < 24 HOURS Performed at [facility], [place] [address], [place], [place] [address]    Report Status PENDING   Lactic acid, plasma     Status: None   Collection Time: 02/22/20  1:30 PM  Result Value Ref Range   Lactic Acid, Venous 1.9 0.5 - 1.9 mmol/L    Comment: Performed at [facility], [place] [address] [place], [place] [address]  CBG monitoring, ED     Status: Abnormal   Collection Time: 02/22/20  6:29 PM  Result Value Ref Range   Glucose-Capillary 296 (H) 70 - 99 mg/dL    Comment: Glucose reference range applies only to samples taken after fasting for at least 8 hours.  CBG monitoring, ED     Status: Abnormal   Collection Time: 02/22/20  8:11 PM  Result  Value Ref Range   Glucose-Capillary 378 (H) 70 - 99 mg/dL    Comment: Glucose reference range applies only to samples taken after fasting for at least 8 hours.  CBG monitoring, ED     Status: Abnormal   Collection Time: 02/22/20 10:41 PM  Result Value Ref Range   Glucose-Capillary 424 (H) 70 - 99 mg/dL    Comment: Glucose reference range applies only to samples taken after fasting for at least 8 hours.  Urinalysis, Routine w reflex microscopic     Status: Abnormal   Collection Time: 02/22/20 10:57 PM  Result Value Ref Range   Color, Urine YELLOW YELLOW   APPearance CLEAR CLEAR   Specific Gravity, Urine 1.021 1.005 - 1.030   pH 5.0 5.0 - 8.0   Glucose, UA >=500 (A) NEGATIVE mg/dL   Hgb urine dipstick NEGATIVE NEGATIVE   Bilirubin Urine NEGATIVE NEGATIVE   Ketones, ur 20 (A) NEGATIVE mg/dL   Protein, ur NEGATIVE NEGATIVE mg/dL   Nitrite NEGATIVE NEGATIVE   Leukocytes,Ua NEGATIVE NEGATIVE   RBC / HPF 0-5 0 - 5 RBC/hpf   WBC, UA 0-5 0 - 5 WBC/hpf   Bacteria, UA NONE SEEN NONE SEEN   Squamous Epithelial / LPF 0-5 0 - 5    Comment: Performed at [facility], [place] [address], [place], [place] [address]  CBC with Differential/Platelet     Status: Abnormal   Collection Time: 02/23/20  7:31 AM  Result Value Ref Range   WBC 2.6 (L) 4.0 - 10.5 K/uL   RBC 3.01 (L) 4.22 - 5.81 MIL/uL   Hemoglobin 8.9 (L) 13.0 - 17.0 g/dL   HCT 26.2 (L) 39.0 - 52.0 %   MCV 87.0 80.0 - 100.0 fL   MCH 29.6 26.0 - 34.0 pg   MCHC 34.0 30.0 - 36.0 g/dL   RDW 12.5 11.5 - 15.5 %   Platelets 436 (H) 150 - 400 K/uL   nRBC 0.0 0.0 - 0.2 %   Neutrophils Relative % 85 %   Neutro Abs 2.2 1.7 - 7.7 K/uL   Lymphocytes Relative 12 %  Lymphs Abs 0.3 (L) 0.7 - 4.0 K/uL   Monocytes Relative 3 %   Monocytes Absolute 0.1 0.1 - 1.0 K/uL   Eosinophils Relative 0 %   Eosinophils Absolute 0.0 0.0 - 0.5 K/uL   Basophils Relative 0 %   Basophils Absolute 0.0 0.0 - 0.1 K/uL   Immature Granulocytes 0 %   Abs Immature  Granulocytes 0.01 0.00 - 0.07 K/uL    Comment: Performed at [facility] [address], [place], [place] [address]  Comprehensive metabolic panel     Status: Abnormal   Collection Time: 02/23/20  7:31 AM  Result Value Ref Range   Sodium 129 (L) 135 - 145 mmol/L   Potassium 4.0 3.5 - 5.1 mmol/L   Chloride 96 (L) 98 - 111 mmol/L   CO2 21 (L) 22 - 32 mmol/L   Glucose, Bld 410 (H) 70 - 99 mg/dL    Comment: Glucose reference range applies only to samples taken after fasting for at least 8 hours.   BUN 29 (H) 8 - 23 mg/dL   Creatinine, Ser 1.14 0.61 - 1.24 mg/dL   Calcium 8.5 (L) 8.9 - 10.3 mg/dL   Total Protein 7.3 6.5 - 8.1 g/dL   Albumin 2.3 (L) 3.5 - 5.0 g/dL   AST 18 15 - 41 U/L   ALT 22 0 - 44 U/L   Alkaline Phosphatase 69 38 - 126 U/L   Total Bilirubin 0.8 0.3 - 1.2 mg/dL   GFR, Estimated >60 >60 mL/min    Comment: (NOTE) Calculated using the CKD-EPI Creatinine Equation (2021)    Anion gap 12 5 - 15    Comment: Performed at [facility], [place] [address] [place], [place] [address]  C-reactive protein     Status: Abnormal   Collection Time: 02/23/20  7:31 AM  Result Value Ref Range   CRP 28.4 (H) <1.0 mg/dL    Comment: Performed at [facility] [address], [place], [place] [address]  D-dimer, quantitative (not at [facility])     Status: Abnormal   Collection Time: 02/23/20  7:31 AM  Result Value Ref Range   D-Dimer, Quant 3.20 (H) 0.00 - 0.50 ug/mL-FEU    Comment: (NOTE) At the manufacturer cut-off value of 0.5 g/mL FEU, this assay has a negative predictive value of 95-100%.This assay is intended for use in conjunction with a clinical pretest probability (PTP) assessment model to exclude pulmonary embolism (PE) and deep venous thrombosis (DVT) in outpatients suspected of PE or DVT. Results should be correlated with clinical presentation. Performed at [facility], [place] [address], [place], [place] [address]   Glucose, capillary     Status: Abnormal    Collection Time: 02/23/20  8:03 AM  Result Value Ref Range   Glucose-Capillary 365 (H) 70 - 99 mg/dL    Comment: Glucose reference range applies only to samples taken after fasting for at least 8 hours.  Glucose, capillary     Status: Abnormal   Collection Time: 02/23/20 12:08 PM  Result Value Ref Range   Glucose-Capillary 409 (H) 70 - 99 mg/dL    Comment: Glucose reference range applies only to samples taken after fasting for at least 8 hours.    Medications:  Current Facility-Administered Medications  Medication Dose Route Frequency Provider Last Rate Last Admin   albuterol (VENTOLIN HFA) 108 (90 Base) MCG/ACT inhaler 2 puff  2 puff Inhalation Q6H Jose Persia, MD   2 puff at 02/23/20 0830  apixaban (ELIQUIS) tablet 5 mg  5 mg Oral BID Jose Persia, MD   5 mg at 02/23/20 5366   chlorpheniramine-HYDROcodone (TUSSIONEX) 10-8 MG/5ML suspension 5 mL  5 mL Oral Q12H PRN Jose Persia, MD       clopidogrel (PLAVIX) tablet 75 mg  75 mg Oral Daily Jose Persia, MD   75 mg at 02/23/20 0826   dexamethasone (DECADRON) tablet 6 mg  6 mg Oral Q24H Jose Persia, MD   6 mg at 02/23/20 4403   ezetimibe (ZETIA) tablet 10 mg  10 mg Oral Daily Jose Persia, MD   10 mg at 02/23/20 0826   guaiFENesin-dextromethorphan (ROBITUSSIN DM) 100-10 MG/5ML syrup 10 mL  10 mL Oral Q4H PRN Jose Persia, MD       insulin aspart (novoLOG) injection 0-15 Units  0-15 Units Subcutaneous TID WC Jose Persia, MD   15 Units at 02/23/20 1337   insulin aspart (novoLOG) injection 10 Units  10 Units Subcutaneous TID WC Jose Persia, MD   10 Units at 02/23/20 1337   insulin glargine (LANTUS) injection 30 Units  30 Units Subcutaneous QHS Jose Persia, MD       lisinopril (ZESTRIL) tablet 10 mg  10 mg Oral Daily Jose Persia, MD   10 mg at 02/23/20 4742   polyethylene glycol (MIRALAX / GLYCOLAX) packet 17 g  17 g Oral Daily PRN Jose Persia, MD       sodium chloride flush (NS) 0.9 %  injection 3 mL  3 mL Intravenous Q12H Jose Persia, MD   3 mL at 02/23/20 5956    Musculoskeletal: Strength & Muscle Tone: within normal limits Gait & Station: normal Patient leans: N/A  Psychiatric Specialty Exam: Physical Exam  Review of Systems  Blood pressure 133/76, pulse 90, temperature 97.7 F (36.5 C), temperature source Oral, resp. rate 20, height 5\' 8"  (1.727 m), weight 88.8 kg, SpO2 93 %.Body mass index is 29.77 kg/m.  General Appearance: Fairly Groomed  Eye Contact:  Fair  Speech:  Clear and Coherent and Normal Rate  Volume:  Normal  Mood:  Anxious  Affect:  Appropriate and Congruent  Thought Process:  Coherent, Linear and Descriptions of Associations: Intact  Orientation:  Full (Time, Place, and Person)  Thought Content:  Logical  Suicidal Thoughts:  No  Homicidal Thoughts:  No  Memory:  Immediate;   Fair Recent;   Fair Remote;   Fair  Judgement:  Intact  Insight:  Fair  Psychomotor Activity:  Normal  Concentration:  Concentration: Fair and Attention Span: Fair  Recall:  [employer] of Knowledge:  Fair  Language:  Fair  Akathisia:  No  Handed:  Right  AIMS (if indicated):     Assets:  Communication Skills Desire for Improvement Financial Resources/Insurance Housing Leisure Time Physical Health Social Support  ADL's:  Intact  Cognition:  WNL  Sleep:        Treatment Plan Summary: Plan Psych cleared. Refer to outpatient behavioral health for further management of stress and passive suicidal thoughts. He denies current suicidal thoughts, but endorses a history. He hasnt wanted to act on these thoughts.  He is able to contract for safety while in the hospital. He does have risk factors that include age, caucasian male, history of depression. He also has protective factors that decrease his risk for suicide attempt. Although suicide attempts can not be predicted, patient has not attempted in the past and appears motivated to seek help for his treatment.    -DC  1:1 safety sitter at this time.  -Will start escitalopram 5mg  po daily for depression. Considering his stay maybe lengthy and likely complicated due to other medical comorbidities. He can be discharged home on this medications and follow up can be scheduled.   Please work with SW to place outpatient referral to outpatient at [facility]. He states he lives in [place].   -Read updated CSW note; daughter is concerned about father signing himself out of ALF, as he did previously. She also expressed concerns about him being depressed and never receiving a formal diagnosis, in addition to seeking guardianship (never pursued it). Patient does appear to have the capacity to make medical decisions regarding returning home and choosing his level of treatment (home health vs ALF). Patient can receive Psychiatric services at [facility], [facility], or [facility] all of which offer virtual services.   Disposition: No evidence of imminent risk to self or others at present.   Patient does not meet criteria for psychiatric inpatient admission. Supportive therapy provided about ongoing stressors. Refer to IOP. Discussed crisis plan, support from social network, calling 911, coming to the Emergency Department, and calling Suicide Hotline.  This service was provided via telemedicine using a 2-way, interactive audio and video technology.  Names of all persons participating in this telemedicine service and their role in this encounter. Name: Sheran Fava Role: [place], Robeline  Name: Joaquim Nam Role: Patient  Name: Hampton Abbot Role: Attending Psychiatrist    Suella Broad, FNP 02/23/2020 4:01 PM

## 2020-02-23 NOTE — Progress Notes (Signed)
Pt received from the ED to room 5W05 on a stretcher accompanied by ED RN and ED NT; pt was safely transferred from stretcher to bed and skin assessment was done w/ assistance from [place], [place]. NT assisted RNs w/ bathing pt and CHG wipes were used to help w/ cleaning pt.   Pt's VS are as follows: BP 122/80 (BP Location: Right Arm)   Pulse 75   Temp 97.9 F (36.6 C) (Oral)   Resp 18   Ht 5\' 8"  (1.727 m)   Wt 88.8 kg   SpO2 94%   BMI 29.77 kg/m   Pt is alert and oriented x4 on 1L O2 via [place] and pt was introduced to night staff as well as oriented to room.   Per admission screenings, pt is a high risk for suicide and covering MD [facility] informed and appropriate orders are in place. 1:1 sitter started and room prepared for SI precautions.

## 2020-02-23 NOTE — Plan of Care (Signed)
  Problem: Education: Goal: Knowledge of General Education information will improve Description: Including pain rating scale, medication(s)/side effects and non-pharmacologic comfort measures Outcome: Progressing   Problem: Clinical Measurements: Goal: Diagnostic test results will improve Outcome: Progressing   Problem: Safety: Goal: Ability to remain free from injury will improve Outcome: Progressing   Problem: Education: Goal: Knowledge of risk factors and measures for prevention of condition will improve Outcome: Progressing   Problem: Respiratory: Goal: Will maintain a patent airway Outcome: Progressing   Problem: Education: Goal: Knowledge of warning signs, risks, and behaviors that relate to suicide ideation and self-harm behaviors will improve Outcome: Progressing   Problem: Clinical Measurements: Goal: Remain free from any harm during hospitalization Outcome: Progressing   Problem: Nutrition: Goal: Adequate fluids and nutrition will be maintained Outcome: Progressing   Problem: Sleep Hygiene: Goal: Ability to obtain adequate restful sleep will improve Outcome: Progressing   Problem: Health Behavior/Discharge Planning: Goal: Ability to manage health-related needs will improve Outcome: Not Progressing   Problem: Activity: Goal: Risk for activity intolerance will decrease Outcome: Not Progressing   Problem: Coping: Goal: Level of anxiety will decrease Outcome: Not Progressing   Problem: Coping: Goal: Psychosocial and spiritual needs will be supported Outcome: Not Progressing   Problem: Coping: Goal: Ability to disclose and discuss thoughts of suicide and self-harm will improve Outcome: Not Progressing

## 2020-02-23 NOTE — Progress Notes (Addendum)
Inpatient Diabetes Program Recommendations  AACE/ADA: New Consensus Statement on Inpatient Glycemic Control (2015)  Target Ranges:  Prepandial:   less than 140 mg/dL      Peak postprandial:   less than 180 mg/dL (1-2 hours)      Critically ill patients:  140 - 180 mg/dL   Lab Results  Component Value Date   GLUCAP 365 (H) 02/23/2020   HGBA1C 9.4 (H) 02/21/2020    Review of Glycemic Control Results for Mark Cabrera, Mark Cabrera (MRN [id number]) as of 02/23/2020 10:39  Ref. Range 02/22/2020 18:29 02/22/2020 20:11 02/22/2020 22:41 02/23/2020 08:03  Glucose-Capillary Latest Ref Range: 70 - 99 mg/dL 296 (H) 378 (H) 424 (H) 365 (H)   Diabetes history: DM2 Outpatient Diabetes medications: Soliqua (40 units Lantus + Lixisenatide) qhs + Novolog 19 units tid meal coverage + Metformin 500 mg bid Current orders for Inpatient glycemic control: Lantus 20 Units + Novolog 6 units tid meal coverage + Novolog 0-15 units correction tid + Decadron 6 units qd  Inpatient Diabetes Program Recommendations:   Noted Decadron decreased from 10 mg qd to 6 mg daily. -Increase Lantus to 30 units daily -Increase Novolog meal coverage to 10 units tid if eats 50% -Add Novolog 0-5 units correction hs -Tradjenta 5 mg -Change diet to carb modified if eating well. Secure chat sent to Dr. Konrad Penta.  Thank you, Mark Cabrera. Mark Keesey, RN, MSN, CDE  Diabetes Coordinator Inpatient Glycemic Control Team Team Pager [phone] (8am-5pm) 02/23/2020 10:45 AM

## 2020-02-23 NOTE — Progress Notes (Signed)
SATURATION QUALIFICATIONS: (This note is used to comply with regulatory documentation for home oxygen)  Patient Saturations on Room Air at Rest = 96%  Patient Saturations on Room Air while Ambulating = 84%  Patient Saturations on 4 Liters of oxygen while Ambulating = 88 %

## 2020-02-24 ENCOUNTER — Other Ambulatory Visit (HOSPITAL_COMMUNITY): Payer: Self-pay | Admitting: Student

## 2020-02-24 DIAGNOSIS — Z794 Long term (current) use of insulin: Secondary | ICD-10-CM

## 2020-02-24 DIAGNOSIS — E1165 Type 2 diabetes mellitus with hyperglycemia: Secondary | ICD-10-CM

## 2020-02-24 DIAGNOSIS — E119 Type 2 diabetes mellitus without complications: Secondary | ICD-10-CM

## 2020-02-24 DIAGNOSIS — J1282 Pneumonia due to coronavirus disease 2019: Secondary | ICD-10-CM

## 2020-02-24 LAB — CBC WITH DIFFERENTIAL/PLATELET
Abs Immature Granulocytes: 0 10*3/uL (ref 0.00–0.07)
Basophils Absolute: 0 10*3/uL (ref 0.0–0.1)
Basophils Relative: 0 %
Eosinophils Absolute: 0 10*3/uL (ref 0.0–0.5)
Eosinophils Relative: 0 %
HCT: 26.6 % — ABNORMAL LOW (ref 39.0–52.0)
Hemoglobin: 9.5 g/dL — ABNORMAL LOW (ref 13.0–17.0)
Lymphocytes Relative: 9 %
Lymphs Abs: 0.3 10*3/uL — ABNORMAL LOW (ref 0.7–4.0)
MCH: 30.4 pg (ref 26.0–34.0)
MCHC: 35.7 g/dL (ref 30.0–36.0)
MCV: 85.3 fL (ref 80.0–100.0)
Monocytes Absolute: 0.1 10*3/uL (ref 0.1–1.0)
Monocytes Relative: 2 %
Neutro Abs: 3.2 10*3/uL (ref 1.7–7.7)
Neutrophils Relative %: 89 %
Platelets: 471 10*3/uL — ABNORMAL HIGH (ref 150–400)
RBC: 3.12 MIL/uL — ABNORMAL LOW (ref 4.22–5.81)
RDW: 12.6 % (ref 11.5–15.5)
WBC: 3.6 10*3/uL — ABNORMAL LOW (ref 4.0–10.5)
nRBC: 0 % (ref 0.0–0.2)
nRBC: 0 /100 WBC

## 2020-02-24 LAB — COMPREHENSIVE METABOLIC PANEL
ALT: 21 U/L (ref 0–44)
AST: 22 U/L (ref 15–41)
Albumin: 2.2 g/dL — ABNORMAL LOW (ref 3.5–5.0)
Alkaline Phosphatase: 62 U/L (ref 38–126)
Anion gap: 11 (ref 5–15)
BUN: 32 mg/dL — ABNORMAL HIGH (ref 8–23)
CO2: 22 mmol/L (ref 22–32)
Calcium: 8.5 mg/dL — ABNORMAL LOW (ref 8.9–10.3)
Chloride: 99 mmol/L (ref 98–111)
Creatinine, Ser: 0.99 mg/dL (ref 0.61–1.24)
GFR, Estimated: >60 mL/min (ref >60)
Glucose, Bld: 357 mg/dL — ABNORMAL HIGH (ref 70–99)
Potassium: 4.1 mmol/L (ref 3.5–5.1)
Sodium: 132 mmol/L — ABNORMAL LOW (ref 135–145)
Total Bilirubin: 0.6 mg/dL (ref 0.3–1.2)
Total Protein: 6.4 g/dL — ABNORMAL LOW (ref 6.5–8.1)

## 2020-02-24 LAB — C-REACTIVE PROTEIN: CRP: 16.9 mg/dL — ABNORMAL HIGH (ref <1.0)

## 2020-02-24 LAB — D-DIMER, QUANTITATIVE: D-Dimer, Quant: 2.26 ug/mL-FEU — ABNORMAL HIGH (ref 0.00–0.50)

## 2020-02-24 LAB — GLUCOSE, CAPILLARY
Glucose-Capillary: 353 mg/dL — ABNORMAL HIGH (ref 70–99)
Glucose-Capillary: 313 mg/dL — ABNORMAL HIGH (ref 70–99)

## 2020-02-24 MED ORDER — ALBUTEROL SULFATE HFA 108 (90 BASE) MCG/ACT IN AERS: 2.0000 | INHALATION_SPRAY | Freq: Four times a day (QID) | RESPIRATORY_TRACT | 0 refills | Status: DC

## 2020-02-24 MED ORDER — ESCITALOPRAM OXALATE 5 MG PO TABS: 5.0000 mg | ORAL_TABLET | Freq: Every day | ORAL | 0 refills | Status: DC

## 2020-02-24 MED ORDER — DEXAMETHASONE 6 MG PO TABS: 6.0000 mg | ORAL_TABLET | ORAL | 0 refills | Status: DC

## 2020-02-24 MED FILL — VENTOLIN HFA 90 MCG INHALER: 108 (90 BAS | 25 days supply | Qty: 18 | Fill #0

## 2020-02-24 MED FILL — DEXAMETHASONE 6 MG TABLET: 6 | 7 days supply | Qty: 7 | Fill #0

## 2020-02-24 MED FILL — ESCITALOPRAM 5 MG TABLET: 5 | 30 days supply | Qty: 30 | Fill #0

## 2020-02-24 NOTE — Progress Notes (Signed)
Inpatient Diabetes Program Recommendations  AACE/ADA: New Consensus Statement on Inpatient Glycemic Control (2015)  Target Ranges:  Prepandial:   less than 140 mg/dL      Peak postprandial:   less than 180 mg/dL (1-2 hours)      Critically ill patients:  140 - 180 mg/dL   Lab Results  Component Value Date   GLUCAP 313 (H) 02/24/2020   HGBA1C 9.4 (H) 02/21/2020    Review of Glycemic Control Results for Mark Cabrera, Mark Cabrera (MRN [id number]) as of 02/24/2020 12:47  Ref. Range 02/23/2020 12:08 02/23/2020 16:49 02/23/2020 20:28 02/24/2020 08:05 02/24/2020 12:16  Glucose-Capillary Latest Ref Range: 70 - 99 mg/dL 409 (H) 433 (H) 405 (H) 353 (H) 313 (H)  Diabetes history: DM2 Outpatient Diabetes medications: Soliqua (40 units Lantus + Lixisenatide) qhs + Novolog 19 units tid meal coverage + Metformin 500 mg bid Current orders for Inpatient glycemic control: Lantus 20 Units + Novolog 18 units tid meal coverage + Novolog 0-15 units correction tid + Decadron 6 units qd  Inpatient Diabetes Program Recommendations:   -Increase Lantus to home dose insulin to 40 units -Increase Novolog meal coverage to 20 units tid meal coverage Secure chat sent to Dr. Konrad Penta.  Thank you, Nani Gasser. Yusef Lamp, RN, MSN, CDE  Diabetes Coordinator Inpatient Glycemic Control Team Team Pager [phone] (8am-5pm) 02/24/2020 12:50 PM

## 2020-02-24 NOTE — TOC Transition Note (Signed)
Transition of Care [facility]) - CM/SW Discharge Note   Patient Details  Name: Mark Cabrera MRN: [id number] Date of Birth: 03-19-1950  [facility]) CM/SW Contact:  Carles Collet, RN Phone Number: 02/24/2020, 2:05 PM   Clinical Narrative:   Mark Cabrera w patient on the phone. He will be discharging to his sister's house.  He asked that I call her. I called patient's sister and confirmed her  Address as [address], [place] [address]. She states that she will be able to pick him up from the hospital. Patient will DC with home oxygen. [employer] accepted referral for home oxygen and will deliver tanks to room and then concentrator to house. Liaison aware patient is on 4L and will need concentrator set up asap at home.     Final next level of care: Home/Self Care Barriers to Discharge: No Barriers Identified   Patient Goals and CMS Choice Patient states their goals for this hospitalization and ongoing recovery are:: to go home      Discharge Placement                       Discharge Plan and Services                DME Arranged: Oxygen DME Agency:  [facility]) Date DME Agency Contacted: 02/24/20 Time DME Agency Contacted: [phone] Representative spoke with at DME Agency: Alecia Lemming            Social Determinants of Health ([place]) Interventions     Readmission Risk Interventions No flowsheet data found.

## 2020-02-24 NOTE — Progress Notes (Signed)
Subjective:   No acute events reported overnight. Yesterday, psychiatry saw the patient and 1:1 sitter was stopped as patient denied any active SI and was found to have capacity to make decision regarding disposition. Patient prefers to go home and does not want to go to an assisted living facility. Breathing and cough have significantly improved, although he was informed he may need to go home with oxygen depending on how he ambulates today.   Update as of 1:30pm: Patient refuses ambulation to check pulse oximetry, stating "if I walk, I'm walking out the door" and is very eager to leave.   Objective:  Vital signs in last 24 hours: Vitals:   02/23/20 2107 02/24/20 0557 02/24/20 0845 02/24/20 1326  BP: 117/71 126/73  (!) 126/57  Pulse: 84 74  75  Resp: 18 18  20   Temp: 97.8 F (36.6 C) 97.8 F (36.6 C)  97.6 F (36.4 C)  TempSrc: Axillary Axillary  Oral  SpO2: 93% 95% 96%   Weight:      Height:       General: Patient appears chronically ill although resting comfortably in no acute distress.  Cardiology: RRR, no murmurs, rubs or gallops.  Respiratory: Lung sounds CTA, bilaterally. No wheezing, rales, rhonchi.  Abdominal: Soft, non-tender, non-distended. Bowel sounds intact.  Skin: Warm, dry, appears jaundiced.  Psychiatric: Patient appears agitated and mildly restless.   Assessment/Plan:  Active Problems:   COVID-19   Acute respiratory failure with hypoxia (HCC)   Suicidal ideation   Pneumonia due to COVID-19 virus   Diabetes ([place])  # COVID-19 Pneumonia  Patient has remained afebrile since admission and HDS. Yesterday was requiring 4L during ambulation to maintain SpO2 of 88%; Initial plan was for repeat ambulation today given patient did not require oxygen to maintain saturations during the day; however, patient refuses to ambulate today, threatening to leave AMA.   - Will discharge with DME order placed for 4L continuous home O2 - Strict return precautions provided -  Will notify [facility] to arrange hospital follow up appointment after quarantine is complete - Instructed patient to continue quarantine for 21 days (until 03/05/20) - Will complete Decadron 6mg  PO x 7 days (total = 10 days)  - Continue Albuterol PRN for SOB/wheezing  - Remdesivir and Baracitinib not indicated - Blood cultures show no growth   # Uncontrolled Type II DM Blood sugars improving after peaking in the 400's yesterday. Hb A1c 9.4. Takes Lantus 40 units nightly and Novolog 18 units three times daily with meals at home although occasionally misses novolog dosing. Steroids likely contributing.   - Will discharge with home insulin dosing and Metformin - For now, continue Novolog 18 units three times daily with meals - Continue sensitive SSI  - Continue CBG monitoring  - Will likely require medication adjustments outpatient   # Depression # Hx of SI  Patient has a long history of passive SI but denies active plan. He previously took Prozac and other medications, but discontinued due to racing thoughts.  - Continue escitalopram 5mg  daily  - TOC consulted, who have listed behavioral health follow up contacts in AVS - Instructed patient to schedule follow up appointment with prescription for escitalopram  - Return precautions provided for active SI   # Chronic Normocytic Anemia  Stable.  - Outpatient follow up   # Mixed Hyperbilirubinemia, Resolved  Mild elevated bilirubin levels. Likely due to COVID-19 infection vs. Underlying alcoholic liver disease. - Follow up outpatient CMP  # Hypertension -  Continue home lisinopril 10mg  daily   # HLD # Hx Vertebral Artery Dissection - Continue ezetimibe 10mg  daily  - Continue Apixaban 5mg  twice daily  - Continue Plavix 75mg  daily   Prior to Admission Living Arrangement: Home Anticipated Discharge Location: Home  Code Status: DNR   Jeralyn Bennett, MD 02/24/2020, 2:06 PM Pager: [phone] After 5pm on weekdays and 1pm on  weekends: On Call pager [phone]

## 2020-02-24 NOTE — Discharge Instructions (Signed)
COVID-19 COVID-19 is a respiratory infection that is caused by a virus called severe acute respiratory syndrome coronavirus 2 (SARS-CoV-2). The disease is also known as coronavirus disease or novel coronavirus. In some people, the virus may not cause any symptoms. In others, it may cause a serious infection. The infection can get worse quickly and can lead to complications, such as:  Pneumonia, or infection of the lungs.  Acute respiratory distress syndrome or ARDS. This is a condition in which fluid build-up in the lungs prevents the lungs from filling with air and passing oxygen into the blood.  Acute respiratory failure. This is a condition in which there is not enough oxygen passing from the lungs to the body or when carbon dioxide is not passing from the lungs out of the body.  Sepsis or septic shock. This is a serious bodily reaction to an infection.  Blood clotting problems.  Secondary infections due to bacteria or fungus.  Organ failure. This is when your body's organs stop working. The virus that causes COVID-19 is contagious. This means that it can spread from person to person through droplets from coughs and sneezes (respiratory secretions). What are the causes? This illness is caused by a virus. You may catch the virus by:  Breathing in droplets from an infected person. Droplets can be spread by a person breathing, speaking, singing, coughing, or sneezing.  Touching something, like a table or a doorknob, that was exposed to the virus (contaminated) and then touching your mouth, nose, or eyes. What increases the risk? Risk for infection You are more likely to be infected with this virus if you:  Are within 6 feet (2 meters) of a person with COVID-19.  Provide care for or live with a person who is infected with COVID-19.  Spend time in crowded indoor spaces or live in shared housing. Risk for serious illness You are more likely to become seriously ill from the virus if you:   Are 50 years of age or older. The higher your age, the more you are at risk for serious illness.  Live in a nursing home or long-term care facility.  Have cancer.  Have a long-term (chronic) disease such as: ? Chronic lung disease, including chronic obstructive pulmonary disease or asthma. ? A long-term disease that lowers your body's ability to fight infection (immunocompromised). ? Heart disease, including heart failure, a condition in which the arteries that lead to the heart become narrow or blocked (coronary artery disease), a disease which makes the heart muscle thick, weak, or stiff (cardiomyopathy). ? Diabetes. ? Chronic kidney disease. ? Sickle cell disease, a condition in which red blood cells have an abnormal "sickle" shape. ? Liver disease.  Are obese. What are the signs or symptoms? Symptoms of this condition can range from mild to severe. Symptoms may appear any time from 2 to 14 days after being exposed to the virus. They include:  A fever or chills.  A cough.  Difficulty breathing.  Headaches, body aches, or muscle aches.  Runny or stuffy (congested) nose.  A sore throat.  New loss of taste or smell. Some people may also have stomach problems, such as nausea, vomiting, or diarrhea. Other people may not have any symptoms of COVID-19. How is this diagnosed? This condition may be diagnosed based on:  Your signs and symptoms, especially if: ? You live in an area with a COVID-19 outbreak. ? You recently traveled to or from an area where the virus is common. ? You   provide care for or live with a person who was diagnosed with COVID-19. ? You were exposed to a person who was diagnosed with COVID-19.  A physical exam.  Lab tests, which may include: ? Taking a sample of fluid from the back of your nose and throat (nasopharyngeal fluid), your nose, or your throat using a swab. ? A sample of mucus from your lungs (sputum). ? Blood tests.  Imaging tests, which  may include, X-rays, CT scan, or ultrasound. How is this treated? At present, there is no medicine to treat COVID-19. Medicines that treat other diseases are being used on a trial basis to see if they are effective against COVID-19. Your health care provider will talk with you about ways to treat your symptoms. For most people, the infection is mild and can be managed at home with rest, fluids, and over-the-counter medicines. Treatment for a serious infection usually takes places in a hospital intensive care unit (ICU). It may include one or more of the following treatments. These treatments are given until your symptoms improve.  Receiving fluids and medicines through an IV.  Supplemental oxygen. Extra oxygen is given through a tube in the nose, a face mask, or a hood.  Positioning you to lie on your stomach (prone position). This makes it easier for oxygen to get into the lungs.  Continuous positive airway pressure (CPAP) or bi-level positive airway pressure (BPAP) machine. This treatment uses mild air pressure to keep the airways open. A tube that is connected to a motor delivers oxygen to the body.  Ventilator. This treatment moves air into and out of the lungs by using a tube that is placed in your windpipe.  Tracheostomy. This is a procedure to create a hole in the neck so that a breathing tube can be inserted.  Extracorporeal membrane oxygenation (ECMO). This procedure gives the lungs a chance to recover by taking over the functions of the heart and lungs. It supplies oxygen to the body and removes carbon dioxide. Follow these instructions at home: Lifestyle  If you are sick, stay home except to get medical care. Your health care provider will tell you how long to stay home. Call your health care provider before you go for medical care.  Rest at home as told by your health care provider.  Do not use any products that contain nicotine or tobacco, such as cigarettes, e-cigarettes, and  chewing tobacco. If you need help quitting, ask your health care provider.  Return to your normal activities as told by your health care provider. Ask your health care provider what activities are safe for you. General instructions  Take over-the-counter and prescription medicines only as told by your health care provider.  Drink enough fluid to keep your urine pale yellow.  Keep all follow-up visits as told by your health care provider. This is important. How is this prevented?  There is no vaccine to help prevent COVID-19 infection. However, there are steps you can take to protect yourself and others from this virus. To protect yourself:   Do not travel to areas where COVID-19 is a risk. The areas where COVID-19 is reported change often. To identify high-risk areas and travel restrictions, check the CDC travel website: wwwnc.cdc.gov/travel/notices  If you live in, or must travel to, an area where COVID-19 is a risk, take precautions to avoid infection. ? Stay away from people who are sick. ? Wash your hands often with soap and water for 20 seconds. If soap and water   are not available, use an alcohol-based hand sanitizer. ? Avoid touching your mouth, face, eyes, or nose. ? Avoid going out in public, follow guidance from your state and local health authorities. ? If you must go out in public, wear a cloth face covering or face mask. Make sure your mask covers your nose and mouth. ? Avoid crowded indoor spaces. Stay at least 6 feet (2 meters) away from others. ? Disinfect objects and surfaces that are frequently touched every day. This may include:  Counters and tables.  Doorknobs and light switches.  Sinks and faucets.  Electronics, such as phones, remote controls, keyboards, computers, and tablets. To protect others: If you have symptoms of COVID-19, take steps to prevent the virus from spreading to others.  If you think you have a COVID-19 infection, contact your health care  provider right away. Tell your health care team that you think you may have a COVID-19 infection.  Stay home. Leave your house only to seek medical care. Do not use public transport.  Do not travel while you are sick.  Wash your hands often with soap and water for 20 seconds. If soap and water are not available, use alcohol-based hand sanitizer.  Stay away from other members of your household. Let healthy household members care for children and pets, if possible. If you have to care for children or pets, wash your hands often and wear a mask. If possible, stay in your own room, separate from others. Use a different bathroom.  Make sure that all people in your household wash their hands well and often.  Cough or sneeze into a tissue or your sleeve or elbow. Do not cough or sneeze into your hand or into the air.  Wear a cloth face covering or face mask. Make sure your mask covers your nose and mouth. Where to find more information  Centers for Disease Control and Prevention: www.cdc.gov/coronavirus/2019-ncov/index.html  World Health Organization: www.who.int/health-topics/coronavirus Contact a health care provider if:  You live in or have traveled to an area where COVID-19 is a risk and you have symptoms of the infection.  You have had contact with someone who has COVID-19 and you have symptoms of the infection. Get help right away if:  You have trouble breathing.  You have pain or pressure in your chest.  You have confusion.  You have bluish lips and fingernails.  You have difficulty waking from sleep.  You have symptoms that get worse. These symptoms may represent a serious problem that is an emergency. Do not wait to see if the symptoms will go away. Get medical help right away. Call your local emergency services (911 in the [place] Do not drive yourself to the hospital. Let the emergency medical personnel know if you think you have COVID-19. Summary  COVID-19 is a  respiratory infection that is caused by a virus. It is also known as coronavirus disease or novel coronavirus. It can cause serious infections, such as pneumonia, acute respiratory distress syndrome, acute respiratory failure, or sepsis.  The virus that causes COVID-19 is contagious. This means that it can spread from person to person through droplets from breathing, speaking, singing, coughing, or sneezing.  You are more likely to develop a serious illness if you are 50 years of age or older, have a weak immune system, live in a nursing home, or have chronic disease.  There is no medicine to treat COVID-19. Your health care provider will talk with you about ways to treat your symptoms.    Take steps to protect yourself and others from infection. Wash your hands often and disinfect objects and surfaces that are frequently touched every day. Stay away from people who are sick and wear a mask if you are sick. This information is not intended to replace advice given to you by your health care provider. Make sure you discuss any questions you have with your health care provider. Document Revised: 12/27/2018 Document Reviewed: 04/04/2018 Elsevier Patient Education  2020 [employer]  

## 2020-02-24 NOTE — Progress Notes (Signed)
Outpatient follow up information placed on AVS for patient as recommended by psychiatry.   Brittan Butterbaugh LCSW

## 2020-02-24 NOTE — Plan of Care (Signed)
  Problem: Coping: Goal: Level of anxiety will decrease Outcome: Progressing   Problem: Safety: Goal: Ability to remain free from injury will improve Outcome: Progressing   Problem: Coping: Goal: Psychosocial and spiritual needs will be supported Outcome: Progressing   Problem: Respiratory: Goal: Will maintain a patent airway Outcome: Progressing   Problem: Sleep Hygiene: Goal: Ability to obtain adequate restful sleep will improve Outcome: Progressing

## 2020-02-24 NOTE — Progress Notes (Signed)
[name] to be D/C'd Home per MD order.  Discussed with the patient and all questions fully answered.  VSS, Skin clean, dry and intact without evidence of skin break down, no evidence of skin tears noted. IV catheter discontinued intact. Site without signs and symptoms of complications. Dressing and pressure applied.  An After Visit Summary was printed and given to the patient. Patient received prescription medications.  D/c education completed with patient/family including follow up instructions, medication list, d/c activities limitations if indicated, with other d/c instructions as indicated by MD - patient able to verbalize understanding, all questions fully answered.   Patient instructed to return to ED, call 911, or call MD for any changes in condition.   Patient escorted via [place], and D/C home via private auto.  Jawaun Celmer T Demauri Advincula 02/24/2020 3:10 PM

## 2020-02-25 ENCOUNTER — Other Ambulatory Visit: Payer: Self-pay | Admitting: *Deleted

## 2020-02-25 ENCOUNTER — Other Ambulatory Visit (HOSPITAL_COMMUNITY): Payer: Self-pay | Admitting: Cardiovascular Disease

## 2020-02-25 ENCOUNTER — Telehealth: Payer: Self-pay | Admitting: Cardiovascular Disease

## 2020-02-25 DIAGNOSIS — Z794 Long term (current) use of insulin: Secondary | ICD-10-CM

## 2020-02-25 DIAGNOSIS — E1140 Type 2 diabetes mellitus with diabetic neuropathy, unspecified: Secondary | ICD-10-CM

## 2020-02-25 MED ORDER — EVOLOCUMAB 140 MG/ML ~~LOC~~ SOAJ: 1.0000 "pen " | SUBCUTANEOUS | 6 refills | Status: DC

## 2020-02-25 MED ORDER — NEXLIZET 180-10 MG PO TABS: 1.0000 | ORAL_TABLET | Freq: Every day | ORAL | 3 refills | Status: DC

## 2020-02-25 MED FILL — BD PEN NDL MINI 31GX5MM: 31G X 5 MM | 33 days supply | Qty: 100 | Fill #0

## 2020-02-25 MED FILL — SOLIQUA 100 UNIT-33 MCG/ML: 100-33 | 37 days supply | Qty: 15 | Fill #0

## 2020-02-25 MED FILL — NOVOLOG FLEXPEN SYRINGE: 100 | 33 days supply | Qty: 18 | Fill #0

## 2020-02-25 MED FILL — FUROSEMIDE 40 MG TAB: 40 | 90 days supply | Qty: 90 | Fill #0

## 2020-02-25 MED FILL — REPATHA SURECLICK 140 MG/ML: 140 | 28 days supply | Qty: 2 | Fill #0

## 2020-02-25 MED FILL — LISINOPRIL 10 MG TABS: 10 | 90 days supply | Qty: 90 | Fill #0

## 2020-02-25 MED FILL — NEXLIZET 180-10 MG TABS: 180-10 | 90 days supply | Qty: 90 | Fill #0

## 2020-02-25 NOTE — Telephone Encounter (Signed)
I just spoke with Mark Cabrera. Refills sent for Nexlizet and Repatha.  Removed Zetia from med list. She confirmed that she is at the [facility] (not the [facility]) and will pick up there.

## 2020-02-25 NOTE — Telephone Encounter (Signed)
I will forward call to both primary card nurse as well as PharmD

## 2020-02-25 NOTE — Telephone Encounter (Signed)
Hey. It looks like from the last lipid clinic note that he should be on Zetia, [place] and Nexlizet. He should be on these. Can we touch base with him and make sure he understands this? Thanks, chris

## 2020-02-25 NOTE — Telephone Encounter (Signed)
    Pt c/o medication issue:  1. Name of Medication:   Bempedoic Acid-Ezetimibe (NEXLIZET) 180-10 MG TABS    Evolocumab 140 MG/ML SOAJ    ezetimibe (ZETIA) 10 MG tablet    2. How are you currently taking this medication (dosage and times per day)?   3. Are you having a reaction (difficulty breathing--STAT)?   4. What is your medication issue? Pt's daughter calling, she said from her understanding these 3 medications is for cholesterol she wanted to know if pt needs to take all 3 medications. If so, they would like to get a refill sent to [facility] [place] [facility] [place]

## 2020-02-25 NOTE — Telephone Encounter (Signed)
Mark Blanks, MD at 02/25/2020 2:29 PM  Status: Signed    Hey. It looks like from the last lipid clinic note that he should be on Zetia, [place] and Nexlizet. He should be on these. Can we touch base with him and make sure he understands this? Thanks, chris       I called and spoke with patient's sister.  She is at the [facility] to pick up medications.  Confirmed this location w her.  He recently switched pharmacies.  Refills sent for Nexlizet and Repatha.  Removed Zetia from medication list since prescribed Nexlizet.

## 2020-02-25 NOTE — Telephone Encounter (Signed)
Patient should only be on [place] and [place]. No need for zetia since its in the nezlizet. Called and LVM for daughter to call back

## 2020-02-25 NOTE — Telephone Encounter (Signed)
Pt's daughter calls with medication concerns. States on his hosp disch papers he is supposed to be on 3 statin meds nexlizet zetia evolocumab She is very concerned and also states he has none of these with his meds I have suggested that she call the cardiologist dr [name] and I will also send a copy of encounter to him also sending to yellow team Please advise and you may call daughter

## 2020-02-25 NOTE — Telephone Encounter (Signed)
Daughter [name] called back and I gave her the information below

## 2020-02-25 NOTE — Telephone Encounter (Signed)
Hey Michalene, Can you make sure this is sent over? Thanks, chris

## 2020-02-25 NOTE — Discharge Summary (Signed)
Name: Mark Cabrera MRN: [id number] DOB: Feb 13, 1951 69 y.o. PCP: [name], MD  Date of Admission: 02/22/2020  6:56 AM Date of Discharge: 02/24/2020 Attending Physician: Dr. Velna Ochs Discharge Diagnosis:  Active Problems:   COVID-19   Acute respiratory failure with hypoxia ([place])   Suicidal ideation   Pneumonia due to COVID-19 virus   Diabetes ([place])  1. COVID-19 Pneumonia w/ Acute Hypoxic Respiratory Failure 2. Uncontrolled Type II DM 3. Passive Suicidal Ideation  4. Chronic Normocytic Anemia  5. Leukopenia  6. HTN 7. HLD   Discharge Medications: Allergies as of 02/24/2020      Reactions   Morpholine Salicylate Other (See Comments)   Hallucinations   Penicillins Other (See Comments)   Allergic- reaction?? Has patient had a PCN reaction causing immediate rash, facial/tongue/throat swelling, SOB or lightheadedness with hypotension: Unk Has patient had a PCN reaction causing severe rash involving mucus membranes or skin necrosis: Unk Has patient had a PCN reaction that required hospitalization: Unk Has patient had a PCN reaction occurring within the last 10 years: No If all of the above answers are "NO", then may proceed with Cephalosporin use. Other reaction(s): Unknown   Morphine And Related Other (See Comments)   Hallucinations   Sglt2 Inhibitors Rash, Other (See Comments)   Candidiasis infection prone   Statins Rash, Other (See Comments)   Severe rash and back pain, and muscle pain      Medication List    STOP taking these medications   insulin lispro 100 UNIT/ML KwikPen Commonly known as: HumaLOG KwikPen   neomycin-polymyxin-hydrocortisone 3.5-10000-1 OTIC suspension Commonly known as: CORTISPORIN     TAKE these medications   acetaminophen 325 MG tablet Commonly known as: TYLENOL Take 2 tablets (650 mg total) by mouth every 4 (four) hours as needed for headache or mild pain.   albuterol 108 (90 Base) MCG/ACT inhaler Commonly known as:  VENTOLIN HFA Inhale 2 puffs into the lungs every 6 (six) hours.   apixaban 5 MG Tabs tablet Commonly known as: Eliquis TAKE 1 TABLET(5 MG) BY MOUTH TWICE DAILY What changed:   how much to take  how to take this  when to take this  additional instructions   Bayer Microlet Lancets lancets Check blood sugar 3 times a day as instructed   BC HEADACHE POWDER PO Take 1 packet by mouth as needed (for back pain).   clopidogrel 75 MG tablet Commonly known as: PLAVIX TAKE 1 TABLET(75 MG) BY MOUTH DAILY What changed:   how much to take  how to take this  when to take this  additional instructions   Contour Next Test test strip Generic drug: glucose blood Check blood sugar 3 times a day as instructed   dexamethasone 6 MG tablet Commonly known as: DECADRON Take 1 tablet (6 mg total) by mouth daily.   escitalopram 5 MG tablet Commonly known as: LEXAPRO Take 1 tablet (5 mg total) by mouth at bedtime.   furosemide 40 MG tablet Commonly known as: LASIX Take 1 tablet (40 mg total) by mouth daily.   insulin aspart 100 UNIT/ML FlexPen Commonly known as: NOVOLOG Inject 18 Units into the skin 3 (three) times daily with meals.   Insulin Pen Needle 31G X 5 MM Misc Use one pen needle three times daily. Dx E11.49   lisinopril 10 MG tablet Commonly known as: ZESTRIL Take 1 tablet by mouth once daily   metFORMIN 500 MG tablet Commonly known as: GLUCOPHAGE TAKE 1 TABLET(500 MG) BY MOUTH  TWICE DAILY WITH A MEAL What changed: See the new instructions.   nitroGLYCERIN 0.4 MG SL tablet Commonly known as: NITROSTAT PLACE 1 TABLET UNDER THE TONGUE EVERY 5 MINUTES AS NEEDED FOR CHEST PAIN What changed:   how much to take  how to take this  when to take this  reasons to take this  additional instructions   Soliqua 100-33 UNT-MCG/ML Sopn Generic drug: Insulin Glargine-Lixisenatide Inject 40 Units into the skin every morning. What changed: when to take this        Disposition and follow-up:   [name] was discharged from [facility] in Stable condition.  At the hospital follow up visit please address:  1.  Overall - patient would greatly benefit from medication reconciliation to be sure he has all prescribed medications and would benefit from psychiatric follow up.   # COVID-19 w/ Acute Hypoxic Respiratory Failure - Patient refused pulse ox with ambulation prior to discharge although was discharged with 4L continuous O2 as he required this to maintain ambulatory sat of 88% the day before discharge. Make sure patient finishes Decadron, 7 talbets prescribed, and assess whether he continues to require oxygen. Quarantine until 03/05/20. # Uncontrolled Type II DM - Patient endorsed relatively good compliance with home Novolog 18 units WC, Soliqua 40 units every morning, and Metformin 500mg  twice daily. However Hb A1c 9.4 % and sugars very elevated up to 400's during admission. Assess need for dose adjustments, especially in setting of steroids.  # Passive Suicidal Ideation - Patient has underlying depression +/- psychosis vs. Bipolar disorder that had not been treated. Started on escitalopram 5mg  nightly. Please assess response to this medication and be sure patient follows up with one of the behavioral health centers below. # AUD - Counsel on cessation.  # Leukopenia - Likely in setting of COVID, although with anemia, may require further workup.  # Chronic Normocytic Anemia - Patient did endorse a single episode of blood mixed in his stool PTA. With hx of cirrhosis, consider further workup including colonoscopy. # HLD - Error was made during discharge; patient should be taking Repatha and Nezlizet and does not need to be taking Zetia. Be sure patient picked up these prescriptions.   2.  Labs / imaging needed at time of follow-up: CBC w/ diff, CMP, colonoscopy  3.  Pending labs/ test needing follow-up: Blood Culture final  reads  Follow-up Appointments:  Follow-up Information    [facility]. Schedule an appointment as soon as possible for a visit.   Why:  [address], [place], [place] [address] [phone]       [facility] Schedule an appointment as soon as possible for a visit.   Contact information: [address] [place] [address] [phone]        [facility]. Schedule an appointment as soon as possible for a visit.   Contact information: [place] [address] [phone]        [name], MD. Schedule an appointment as soon as possible for a visit in 2 week(s).   Specialty: Internal Medicine Why: You should receive a call to schedule an appointment with [facility] after 03/05/20 for hospital follow up.  Contact information: [place] [address] [phone]        Burnell Blanks, MD .   Specialty: Cardiology Contact information: [address] [place] [address] [phone]  [facility] Follow up.   Why: for home oxygen Contact information: [address] [place] [address] [phone]               Hospital Course by problem list: 1. COVID-19 Pneumonia  Patient initially febrile with temperature 100.4 degrees, tachypneic, saturating at 91% on [place]. Labs show leukopenia with WBC 1.4, troponin 23 > 21, lactic acid 2.3, CRP 34.5, LDL 241, Ferritin 639, Fibrinogen >800, D-dimer 3.63, procalcitonin 1.35. CXR shows bilateral patchy airspace opacities consistent with PNA. Patient tested positive for COVID 02/13/20. Patient given decadron x 10 days, baracitinib and remdesivir were not indicated due to duration and hx of cirrhosis. Patient was discharged on 4L [place] continuous O2. He has never used oxygen and refused ambulatory pulse ox the day of discharge, although was otherwise HDS, discharged on 4L he required  the day prior to maintain O2 sats of 88%. Instructed to Quarantine until 03/05/20.    2. Uncontrolled Type II DM Patient takes Novolog/Humalog 18 units with meals, lantus 40 units in the mornings, and metformin 500mg  twice daily, although occasionally misses novolog dosing. Blood sugars increased to the 400's as insulins neared home dosing, however, improved prior to discharge. Likely reactive to steroids and infection. Hb A1c 9.4%. Will require further dose adjustments and monitoring on discharge.   3. Passive Suicidal Ideation  At one point during hospital stay, patient endorsed depression and when asked about a suicidal plan, stated "there is nothing in this room that I can kill myself with". Denied HI. He was placed with a 1:1 sitter. Psychiatry saw the patient and discontinued the 1:1 sitter and started escitalopram 5mg  daily for depression. He denied current SI but endorsed a history of passive SI, never wanting to act on the thoughts. He had been tried on other psychiatric medications but discontinued these due to racing thoughts. Prefers not to have a psychiatric diagnosis so has not received care for mental illness.   4. Chronic Normocytic Anemia  Patient has stable, chronic normocytic anemia. He did endorse some mild left-sided abdominal discomfort and endorsed a single episode of stool mixed with blood, although states he has not recently had issues with rectal bleeding. He does have a history of cirrhosis and no documented colonoscopy.   5. Leukopenia  WBC low since admission, although did improve. Likely due to COVID-19 although should repeat CBC.  Discharge Vitals:   BP (!) 126/57 (BP Location: Right Arm)   Pulse 75   Temp 97.6 F (36.4 C) (Oral)   Resp 20   Ht 5\' 8"  (1.727 m)   Wt 88.8 kg   SpO2 96%   BMI 29.77 kg/m   Pertinent Labs, Studies, and Procedures:   COVID-19 positive Elevated Inflammatory Markers  CXR 02/19/20: FINDINGS: Hypoinflated lungs with patchy  bibasilar opacities. Trace right pleural effusion. No pneumothorax. Stable postprocedural appearance of the cardiomediastinal silhouette. No acute osseous abnormality.  IMPRESSION: New patchy bibasilar opacities and trace right pleural effusion.  Differential includes atelectasis, edema or infection.  CTA Chest 02/19/20: IMPRESSION: No evidence of pulmonary embolus. Extensive peripheral bilateral airspace opacities most compatible with COVID pneumonia. Prior CABG. Cirrhosis Aortic Atherosclerosis (ICD10-I70.0).   Discharge Instructions: Discharge Instructions    Call MD for:  difficulty breathing, headache or visual disturbances   Complete by: As directed    Call MD for:  extreme fatigue   Complete by: As directed    Call MD for:  persistant dizziness or light-headedness   Complete by: As  directed    Call MD for:  persistant nausea and vomiting   Complete by: As directed    Call MD for:  temperature >100.4   Complete by: As directed    Diet - low sodium heart healthy   Complete by: As directed    Diet Carb Modified   Complete by: As directed    Discharge instructions   Complete by: As directed    Mr. Rey, [place] were admitted to the hospital because you required oxygen in the setting of COVID-19 pneumonia. You were started on steroids and were found to need oxygen (up to 4L) for home based on yesterday's assessment.   Please start taking the following medications:  START taking Decadron, 1 tablet each day until you complete the course.  START using your Albuterol inhaler 1-2 puffs every 4 hours as needed for shortness of breath or wheezing. START taking escitalopram 5mg  nightly.  Please continue to take your insulins and other medications as prescribed.   You will need to continue to quarantine (isolate) away from others until 03/05/20.   I have notified our [facility] clinic in the ground floor of the hospital that you will require a hospital follow up appointment to  see how you are doing after December 24th. If you do not hear back, please call to schedule an appointment at [phone].   You were started on escitalopram for depression. Please be sure to call either [place], [facility] health services, or [facility] (numbers attached) to schedule an outpatient appointment to be sure you are tolerating this medication well.   Please return to the ED if you experience persistent fevers, inability to tolerate PO intake, worsening SOB, severe weakness, active suicidal ideation, or any other concerning symptoms.   It was a pleasure caring for you and take care,  Dr. Konrad Penta   Increase activity slowly   Complete by: As directed       Signed: Jeralyn Bennett, MD 02/25/2020, 7:08 PM   Pager: [phone]

## 2020-02-27 LAB — CULTURE, BLOOD (ROUTINE X 2)
Culture: NO GROWTH
Culture: NO GROWTH
Special Requests: ADEQUATE
Special Requests: ADEQUATE

## 2020-03-01 ENCOUNTER — Encounter: Payer: Medicare Other | Admitting: Student

## 2020-03-01 ENCOUNTER — Telehealth: Payer: Self-pay

## 2020-03-01 NOTE — Telephone Encounter (Signed)
Pt's daughter requesting FL2 form, please call back.

## 2020-03-01 NOTE — Progress Notes (Signed)
12/20: CSW received call from patient's daughter, [name], requesting an FL2 for the patient as he is now willing to go to SNF/ALF. CSW explained that she will need to obtain it from patient's PCP since he is no longer in the hospital. She reported understanding and requested a SNF list emailed to her in addition to the ALF list she received last admission ([email or web address]) which CSW did email.  Janyia Guion LCSW

## 2020-03-01 NOTE — Telephone Encounter (Signed)
FL2 completed by Dr Alfonse Spruce and given to [place]. Hubbard Hartshorn, BSN, RN-BC

## 2020-03-01 NOTE — Telephone Encounter (Addendum)
FL2 form given to [place] to complete. Hubbard Hartshorn, BSN, RN-BC

## 2020-03-03 NOTE — Telephone Encounter (Signed)
Patient's daughter notified that FL2 has been completed and is ready for p/u or can be mailed. States she is bringing patient on 03/10/2020 and recommended Level of Care may need to be changed from ALF to SNF. Hubbard Hartshorn, BSN, RN-BC

## 2020-03-08 ENCOUNTER — Telehealth: Payer: Self-pay

## 2020-03-08 ENCOUNTER — Other Ambulatory Visit (HOSPITAL_COMMUNITY): Payer: Self-pay | Admitting: Internal Medicine

## 2020-03-08 ENCOUNTER — Telehealth: Payer: Self-pay | Admitting: *Deleted

## 2020-03-08 ENCOUNTER — Other Ambulatory Visit: Payer: Self-pay | Admitting: Internal Medicine

## 2020-03-08 MED ORDER — INSULIN ASPART 100 UNIT/ML FLEXPEN: 18.0000 [IU] | PEN_INJECTOR | Freq: Three times a day (TID) | SUBCUTANEOUS | 0 refills | Status: DC

## 2020-03-08 NOTE — Telephone Encounter (Signed)
Prescription sent. Thanks

## 2020-03-08 NOTE — Telephone Encounter (Signed)
PA for Novolog FlexPen. New prescription for needed if patient needs to continue.  Angelina Ok, RN 03/08/2020 9:32 AM.

## 2020-03-08 NOTE — Telephone Encounter (Signed)
Pt daughter called asked if he can have a covid test done at his appt . When asked if he is having any symptoms she stated he tested negative with a at home test  .. please call back

## 2020-03-10 ENCOUNTER — Ambulatory Visit (INDEPENDENT_AMBULATORY_CARE_PROVIDER_SITE_OTHER): Payer: Medicare Other | Admitting: Internal Medicine

## 2020-03-10 ENCOUNTER — Other Ambulatory Visit (HOSPITAL_COMMUNITY): Payer: Self-pay | Admitting: Internal Medicine

## 2020-03-10 ENCOUNTER — Encounter: Payer: Self-pay | Admitting: Internal Medicine

## 2020-03-10 VITALS — BP 88/56 | HR 79 | Temp 97.9°F | Wt 193.0 lb

## 2020-03-10 DIAGNOSIS — Z794 Long term (current) use of insulin: Secondary | ICD-10-CM | POA: Diagnosis not present

## 2020-03-10 DIAGNOSIS — I952 Hypotension due to drugs: Secondary | ICD-10-CM | POA: Insufficient documentation

## 2020-03-10 DIAGNOSIS — I2510 Atherosclerotic heart disease of native coronary artery without angina pectoris: Secondary | ICD-10-CM | POA: Diagnosis not present

## 2020-03-10 DIAGNOSIS — E1140 Type 2 diabetes mellitus with diabetic neuropathy, unspecified: Secondary | ICD-10-CM

## 2020-03-10 DIAGNOSIS — F321 Major depressive disorder, single episode, moderate: Secondary | ICD-10-CM

## 2020-03-10 DIAGNOSIS — I480 Paroxysmal atrial fibrillation: Secondary | ICD-10-CM

## 2020-03-10 DIAGNOSIS — U071 COVID-19: Secondary | ICD-10-CM | POA: Diagnosis not present

## 2020-03-10 DIAGNOSIS — J1282 Pneumonia due to coronavirus disease 2019: Secondary | ICD-10-CM | POA: Diagnosis not present

## 2020-03-10 DIAGNOSIS — I959 Hypotension, unspecified: Secondary | ICD-10-CM | POA: Insufficient documentation

## 2020-03-10 MED ORDER — METFORMIN HCL 500 MG PO TABS: 500.0000 mg | ORAL_TABLET | Freq: Two times a day (BID) | ORAL | 3 refills | Status: DC

## 2020-03-10 MED ORDER — ESCITALOPRAM OXALATE 10 MG PO TABS: 10.0000 mg | ORAL_TABLET | Freq: Every day | ORAL | 3 refills | Status: DC

## 2020-03-10 MED ORDER — NEXLIZET 180-10 MG PO TABS: 1.0000 | ORAL_TABLET | Freq: Every day | ORAL | 3 refills | Status: DC

## 2020-03-10 NOTE — Patient Instructions (Addendum)
Dear [name],  Thank you for allowing [place] to provide your care today. Today we discussed your medications    I have ordered cbc, cmp labs for you. I will call if any are abnormal.    Today we made the following changes to your medications:    Please stop your lisinopril and furosemide and nitroglycerin  Please follow-up in 2 weeks.    Please call the internal medicine center clinic if you have any questions or concerns, we may be able to help and keep you from a long and expensive emergency room wait. Our clinic and after hours phone number is [phone], the best time to call is Monday through Friday 9 am to 4 pm but there is always someone available 24/7 if you have an emergency. If you need medication refills please notify your pharmacy one week in advance and they will send [place] a request.    If you have not gotten the COVID vaccine, I recommend doing so:  You may get it at your local [facility] or [facility] OR To schedule an appointment for a COVID vaccine or be added to the vaccine wait list: Go to [email or web address]   OR Go to [email or web address]                  OR Call [phone]                                     OR Call [phone] and select Option 2  Thank you for choosing [facility]   Orthostatic Hypotension Blood pressure is a measurement of how strongly, or weakly, your blood is pressing against the walls of your arteries. Orthostatic hypotension is a sudden drop in blood pressure that happens when you quickly change positions, such as when you get up from sitting or lying down. Arteries are blood vessels that carry blood from your heart throughout your body. When blood pressure is too low, you may not get enough blood to your brain or to the rest of your organs. This can cause weakness, light-headedness, rapid heartbeat, and fainting. This can last for just a few seconds or for up to a few minutes. Orthostatic hypotension is usually not a serious  problem. However, if it happens frequently or gets worse, it may be a sign of something more serious. What are the causes? This condition may be caused by:  Sudden changes in posture, such as standing up quickly after you have been sitting or lying down.  Blood loss.  Loss of body fluids (dehydration).  Heart problems.  Hormone (endocrine) problems.  Pregnancy.  Severe infection.  Lack of certain nutrients.  Severe allergic reactions (anaphylaxis).  Certain medicines, such as blood pressure medicine or medicines that make the body lose excess fluids (diuretics). Sometimes, this condition can be caused by not taking medicine as directed, such as taking too much of a certain medicine. What increases the risk? The following factors may make you more likely to develop this condition:  Age. Risk increases as you get older.  Conditions that affect the heart or the central nervous system.  Taking certain medicines, such as blood pressure medicine or diuretics.  Being pregnant. What are the signs or symptoms? Symptoms of this condition may include:  Weakness.  Light-headedness.  Dizziness.  Blurred vision.  Fatigue.  Rapid heartbeat.  Fainting, in severe cases. How is this diagnosed? This condition is diagnosed  based on:  Your medical history.  Your symptoms.  Your blood pressure measurement. Your health care provider will check your blood pressure when you are: ? Lying down. ? Sitting. ? Standing. A blood pressure reading is recorded as two numbers, such as "120 over 80" (or 120/80). The first ("top") number is called the systolic pressure. It is a measure of the pressure in your arteries as your heart beats. The second ("bottom") number is called the diastolic pressure. It is a measure of the pressure in your arteries when your heart relaxes between beats. Blood pressure is measured in a unit called mm Hg. Healthy blood pressure for most adults is 120/80. If your  blood pressure is below 90/60, you may be diagnosed with hypotension. Other information or tests that may be used to diagnose orthostatic hypotension include:  Your other vital signs, such as your heart rate and temperature.  Blood tests.  Tilt table test. For this test, you will be safely secured to a table that moves you from a lying position to an upright position. Your heart rhythm and blood pressure will be monitored during the test. How is this treated? This condition may be treated by:  Changing your diet. This may involve eating more salt (sodium) or drinking more water.  Taking medicines to raise your blood pressure.  Changing the dosage of certain medicines you are taking that might be lowering your blood pressure.  Wearing compression stockings. These stockings help to prevent blood clots and reduce swelling in your legs. In some cases, you may need to go to the hospital for:  Fluid replacement. This means you will receive fluids through an IV.  Blood replacement. This means you will receive donated blood through an IV (transfusion).  Treating an infection or heart problems, if this applies.  Monitoring. You may need to be monitored while medicines that you are taking wear off. Follow these instructions at home: Eating and drinking   Drink enough fluid to keep your urine pale yellow.  Eat a healthy diet, and follow instructions from your health care provider about eating or drinking restrictions. A healthy diet includes: ? Fresh fruits and vegetables. ? Whole grains. ? Lean meats. ? Low-fat dairy products.  Eat extra salt only as directed. Do not add extra salt to your diet unless your health care provider told you to do that.  Eat frequent, small meals.  Avoid standing up suddenly after eating. Medicines  Take over-the-counter and prescription medicines only as told by your health care provider. ? Follow instructions from your health care provider about  changing the dosage of your current medicines, if this applies. ? Do not stop or adjust any of your medicines on your own. General instructions   Wear compression stockings as told by your health care provider.  Get up slowly from lying down or sitting positions. This gives your blood pressure a chance to adjust.  Avoid hot showers and excessive heat as directed by your health care provider.  Return to your normal activities as told by your health care provider. Ask your health care provider what activities are safe for you.  Do not use any products that contain nicotine or tobacco, such as cigarettes, e-cigarettes, and chewing tobacco. If you need help quitting, ask your health care provider.  Keep all follow-up visits as told by your health care provider. This is important. Contact a health care provider if you:  Vomit.  Have diarrhea.  Have a fever for more than  2-3 days.  Feel more thirsty than usual.  Feel weak and tired. Get help right away if you:  Have chest pain.  Have a fast or irregular heartbeat.  Develop numbness in any part of your body.  Cannot move your arms or your legs.  Have trouble speaking.  Become sweaty or feel light-headed.  Faint.  Feel short of breath.  Have trouble staying awake.  Feel confused. Summary  Orthostatic hypotension is a sudden drop in blood pressure that happens when you quickly change positions.  Orthostatic hypotension is usually not a serious problem.  It is diagnosed by having your blood pressure taken lying down, sitting, and then standing.  It may be treated by changing your diet or adjusting your medicines. This information is not intended to replace advice given to you by your health care provider. Make sure you discuss any questions you have with your health care provider. Document Revised: 08/23/2017 Document Reviewed: 08/23/2017 Elsevier Patient Education  [place].

## 2020-03-10 NOTE — Progress Notes (Signed)
CC: Fatigue  HPI: [name] is a 69 y.o. with PMH listed below presenting with complaint of fatigue. Please see problem based assessment and plan for further details.  Past Medical History:  Diagnosis Date  . Acute diastolic congestive heart failure (HCC)   . Aortic valve stenosis s/p AVR 2018  . Atrial fibrillation (HCC) - post-op CABG    04/2016 CHA2DS2VAS score = 5  . Atypical nevi   . Coronary artery disease s/p 2 vessel CABG   . Depression    "years ago"  . Diabetes mellitus   . Dyspnea    in the past   . GERD (gastroesophageal reflux disease)   . Heart murmur   . Hepatic cirrhosis (HCC) 06/29/2017  . History of kidney stones   . Hyperlipidemia    hx of transaminitis secondary to statin and he has decided not to use statins secondary to potential side effects.  . Hypertension   . Nephrolithiasis   . Osteoarthritis, knee   . Sleep apnea     Central apnea. Not using cpap  . Stroke [facility]) 2007  . Transaminitis     Statin-induced  . 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 2-D echo was normal no embolic source found.   Review of Systems: Review of Systems  Constitutional: Positive for malaise/fatigue. Negative for chills and fever.  Eyes: Positive for blurred vision.  Respiratory: Positive for shortness of breath. Negative for cough.   Cardiovascular: Negative for chest pain, palpitations and leg swelling.  Gastrointestinal: Negative for constipation, diarrhea, nausea and vomiting.  Neurological: Negative for dizziness and headaches.  Psychiatric/Behavioral: Positive for depression and memory loss.  All other systems reviewed and are negative.    Physical Exam: Vitals:   03/10/20  1417  BP: (!) 88/56  Pulse: 79  Temp: 97.9 F (36.6 C)  TempSrc: Oral  SpO2: 97%  Weight: 193 lb (87.5 kg)   Gen: Well-developed, chronically ill-appearing, NAD HEENT: NCAT head, hearing intact CV: RRR, S1, S2 normal Pulm: CTAB, No rales, no wheezes Abd: NTND, BS+ Extm: ROM intact, Peripheral pulses intact, No peripheral edema Skin: Dry, Warm, poor turgor  Assessment & Plan:   Hypotension due to medication Mark Cabrera is a 69 yo M w/ PMH of PAF, HTN, HFpEF, CAD, recent COVID-19 pneumonia presenting to [facility] with complaint of fatigue. He is examined and evaluated with daughter present who provides additional history. He mentions that after discharge from hospital for COVID-19, he has been mostly at home and feeling fatigued and weak. He mentions that he tries to take all of his medications as prescribed and mentions continuing to take his furosemide as prescribed. 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 Noted to be hypotensive with bp of 88/56. Not in significant distress. Appear to be iatrogenic cause due to inappropriate medication administration due to confusion. May also be having poor oral intake post-COVID infection due to loss of smell and poor appetite causing dehydration with concurrent diuretic use. - Stop bp lowering meds (lisinopril, furosemide, nitroglycerin) - F/u in 1 week - Encouraged oral intake - FL2 provided  for ALF application due to inability to care for self at home  Paroxysmal atrial fibrillation ([place]) CHADVASC2score of 4. On eliquis for anti-coagulation. Mark Cabrera express concern regarding seeing a 'TV ad' that warned against eliquis use in setting of history of valvular disease. Advised that eliquis is appropriate for him and he should take his meds as prescribed. His daughter  express concern regarding his friends that visit and recommend variety of herbal supplementation including ginger, turmeric, etc.   A/P On anti-coagulation for PAF. HR 79 this visit. At high risk for bleed due to inappropriate herbal supplementation. Had prolonged conversation regarding increased risk of bleed with certain herbal supplements.  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 Mentions finishing his course of dexamethasone and his sugars improving. Denies any hypoglycemic events. Requests refill on metformin  A/P - refills provided - C/w metformin 500mg  BID, Soliquia 40 units daily, Humalog 18 units TID qc   Depression On recent hospitalization started on escitalopram. Mentions noticing no significant difference in mood since starting med. Denies any suicidal/homicial ideations. PHQ-9 score of 21 this visit.  A/P Chart review shows initiation with low dose escitalopram at 5mg . Tolerating it well without significant side effects. Will up-titrate to therapeutic dose. - Increase escitalopram to 10mg  daily  Pneumonia due to COVID-19 virus Mark Cabrera presents to [facility] for hospital follow up visit after admission for COVID-19 pneumonia on 02/24/20. Since discharge, he has completed his course of steroids. He continues to endorse fatigue, weakness and dyspnea with exertion but he has not used his home oxygen. His daughter mentions that his oxygen drops to mid 35s with ambulation. He denies loss of smell but mentions worsening memory/cognitive issues.  A/P Presents for f/u after COVID-19. Respiratory status appear intact. Dealing with post-covid syndrome. Will make referral to Post-COVID care clinic at [place] for post-covid clinic provided    Patient discussed with Dr. Daryll Drown  -Gilberto Better,  [facility] Pager: [phone]

## 2020-03-10 NOTE — Assessment & Plan Note (Signed)
CHADVASC2score of 4. On eliquis for anti-coagulation. Mark Cabrera express concern regarding seeing a 'TV ad' that warned against eliquis use in setting of history of valvular disease. Advised that eliquis is appropriate for him and he should take his meds as prescribed. His daughter express concern regarding his friends that visit and recommend variety of herbal supplementation including ginger, turmeric, etc.   A/P On anti-coagulation for PAF. HR 79 this visit. At high risk for bleed due to inappropriate herbal supplementation. Had prolonged conversation regarding increased risk of bleed with certain herbal supplements.  - Provided list of supplements to avoid while on anticoagulation - C/w eliquis 5mg  BID

## 2020-03-10 NOTE — Assessment & Plan Note (Signed)
Had episodes of hyperglycemia during recent hospitalization while on COVID. Mentions finishing his course of dexamethasone and his sugars improving. Denies any hypoglycemic events. Requests refill on metformin  A/P - refills provided - C/w metformin 500mg  BID, Soliquia 40 units daily, Humalog 18 units TID qc

## 2020-03-10 NOTE — Assessment & Plan Note (Signed)
On recent hospitalization started on escitalopram. Mentions noticing no significant difference in mood since starting med. Denies any suicidal/homicial ideations. PHQ-9 score of 21 this visit.  A/P Chart review shows initiation with low dose escitalopram at 5mg . Tolerating it well without significant side effects. Will up-titrate to therapeutic dose. - Increase escitalopram to 10mg  daily

## 2020-03-10 NOTE — Assessment & Plan Note (Addendum)
Mark Cabrera is a 69 yo M w/ PMH of PAF, HTN, HFpEF, CAD, recent COVID-19 pneumonia presenting to [facility] with complaint of fatigue. He is examined and evaluated with daughter present who provides additional history. He mentions that after discharge from hospital for COVID-19, he has been mostly at home and feeling fatigued and weak. He mentions that he tries to take all of his medications as prescribed and mentions continuing to take his furosemide as prescribed. 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 Noted to be hypotensive with bp of 88/56. Not in significant distress. Appear to be iatrogenic cause due to inappropriate medication administration due to confusion. May also be having poor oral intake post-COVID infection due to loss of smell and poor appetite causing dehydration with concurrent diuretic use. - Stop bp lowering meds (lisinopril, furosemide, nitroglycerin) - F/u in 1 week - Encouraged oral intake - FL2 provided for ALF application due to inability to care for self at home

## 2020-03-10 NOTE — Assessment & Plan Note (Signed)
Mr.Bathgate presents to [facility] for hospital follow up visit after admission for COVID-19 pneumonia on 02/24/20. Since discharge, he has completed his course of steroids. He continues to endorse fatigue, weakness and dyspnea with exertion but he has not used his home oxygen. His daughter mentions that his oxygen drops to mid 80s with ambulation. He denies loss of smell but mentions worsening memory/cognitive issues.  A/P Presents for f/u after COVID-19. Respiratory status appear intact. Dealing with post-covid syndrome. Will make referral to Post-COVID care clinic at [facility] info for post-covid clinic provided

## 2020-03-11 LAB — CMP14 + ANION GAP
ALT: 20 IU/L (ref 0–44)
AST: 20 IU/L (ref 0–40)
Albumin/Globulin Ratio: 1.7 (ref 1.2–2.2)
Albumin: 3.9 g/dL (ref 3.7–4.7)
Alkaline Phosphatase: 68 IU/L (ref 44–121)
Anion Gap: 15 mmol/L (ref 10.0–18.0)
BUN/Creatinine Ratio: 14 (ref 10–24)
BUN: 15 mg/dL (ref 8–27)
Bilirubin Total: 0.3 mg/dL (ref 0.0–1.2)
CO2: 23 mmol/L (ref 20–29)
Calcium: 8.9 mg/dL (ref 8.6–10.2)
Chloride: 100 mmol/L (ref 96–106)
Creatinine, Ser: 1.06 mg/dL (ref 0.76–1.27)
GFR calc Af Amer: 81 mL/min/{1.73_m2} (ref >59)
GFR calc non Af Amer: 70 mL/min/{1.73_m2} (ref >59)
Globulin, Total: 2.3 g/dL (ref 1.5–4.5)
Glucose: 47 mg/dL — ABNORMAL LOW (ref 65–99)
Potassium: 3.7 mmol/L (ref 3.5–5.2)
Sodium: 138 mmol/L (ref 134–144)
Total Protein: 6.2 g/dL (ref 6.0–8.5)

## 2020-03-11 LAB — CBC
Hematocrit: 32.3 % — ABNORMAL LOW (ref 37.5–51.0)
Hemoglobin: 10.7 g/dL — ABNORMAL LOW (ref 13.0–17.7)
MCH: 29.6 pg (ref 26.6–33.0)
MCHC: 33.1 g/dL (ref 31.5–35.7)
MCV: 90 fL (ref 79–97)
Platelets: 340 10*3/uL (ref 150–450)
RBC: 3.61 x10E6/uL — ABNORMAL LOW (ref 4.14–5.80)
RDW: 13.5 % (ref 11.6–15.4)
WBC: 6.2 10*3/uL (ref 3.4–10.8)

## 2020-03-11 MED FILL — ESCITALOPRAM 10 MG TABLET: 10 | 30 days supply | Qty: 30 | Fill #0

## 2020-03-11 MED FILL — METFORMIN HCL 500 MG TABS: 500 | 45 days supply | Qty: 90 | Fill #0

## 2020-03-15 ENCOUNTER — Other Ambulatory Visit: Payer: Self-pay

## 2020-03-15 ENCOUNTER — Telehealth: Payer: Self-pay | Admitting: Internal Medicine

## 2020-03-15 ENCOUNTER — Telehealth: Payer: Self-pay | Admitting: *Deleted

## 2020-03-15 ENCOUNTER — Other Ambulatory Visit (HOSPITAL_COMMUNITY): Payer: Self-pay | Admitting: Internal Medicine

## 2020-03-15 DIAGNOSIS — I480 Paroxysmal atrial fibrillation: Secondary | ICD-10-CM

## 2020-03-15 DIAGNOSIS — E1142 Type 2 diabetes mellitus with diabetic polyneuropathy: Secondary | ICD-10-CM

## 2020-03-15 MED ORDER — CONTOUR NEXT TEST VI STRP: ORAL_STRIP | 12 refills | Status: DC

## 2020-03-15 MED ORDER — APIXABAN 5 MG PO TABS: 5.0000 mg | ORAL_TABLET | Freq: Two times a day (BID) | ORAL | 3 refills | Status: DC

## 2020-03-15 MED FILL — ELIQUIS 5 MG TABLET: 5 | 30 days supply | Qty: 60 | Fill #0

## 2020-03-15 MED FILL — NOVOLOG FLEXPEN SYRINGE: 100 | 84 days supply | Qty: 45 | Fill #0

## 2020-03-15 MED FILL — REPATHA SURECLICK 140 MG/ML: 140 | 28 days supply | Qty: 2 | Fill #0

## 2020-03-15 NOTE — Telephone Encounter (Signed)
Discussed with Mark Cabrera's daughter, Mark Cabrera, regarding Mark Cabrera's labs including his hypoglycemia. Advised on frequent glucose checks at meal time and holding mealtime insulin if cbg is below 70. Also discussed regarding ALF vs SNF and inability to have medicare cover for SNF without 48 hour inpatient hospital stay. Mark Cabrera expressed understanding but requests assistance with finding placement. Let Mark Cabrera know that I will find out if we have any social work support available. All other questions and concerns addressed.

## 2020-03-15 NOTE — Telephone Encounter (Signed)
Received faxed CMP and CBC results drawn 03/10/2020 from Lab Staff. Glucose = 47. Handed to Yellow Team Providers. Kinnie Feil, BSN, RN-BC

## 2020-03-15 NOTE — Addendum Note (Signed)
Addended by: Theotis Barrio on: 03/15/2020 05:28 PM   Modules accepted: Orders

## 2020-03-15 NOTE — Telephone Encounter (Signed)
Looking at the documentation from 12/29, it appears FL2 paperwork was provided to the patient that day for ALF.

## 2020-03-15 NOTE — Telephone Encounter (Signed)
Attempted to call Mark Cabrera's listed number to discuss his lab results. Patient did not pick up. Voicemail not set up.

## 2020-03-15 NOTE — Telephone Encounter (Signed)
Patient's daughter called in stating there was a discussion with Provider on 03/10/2020 regarding changing level of care on FL2 from ALF to SNF. She is wanting to know if this has been done so she can begin calling nursing homes. Kinnie Feil, BSN, RN-BC

## 2020-03-15 NOTE — Telephone Encounter (Signed)
She did receive a completed FL2 for ALF on her visit last week. She wanted to know if she can get Mr.Palomino placed in a SNF. I told her not likely in setting of current situation with the pandemic and him being in the outpatient setting. Any advice on if this is possible?

## 2020-03-15 NOTE — Telephone Encounter (Signed)
I'll follow up on these labs.

## 2020-03-15 NOTE — Telephone Encounter (Signed)
Need refill on apixaban (ELIQUIS) 5 MG TABS tablet ;pt contact [phone]   [facility] - [place], [place] - [address].

## 2020-03-16 ENCOUNTER — Other Ambulatory Visit: Payer: Self-pay

## 2020-03-16 ENCOUNTER — Encounter: Payer: Self-pay | Admitting: Physician Assistant

## 2020-03-16 ENCOUNTER — Ambulatory Visit (INDEPENDENT_AMBULATORY_CARE_PROVIDER_SITE_OTHER): Payer: Medicare Other | Admitting: Physician Assistant

## 2020-03-16 VITALS — BP 100/62 | HR 87 | Ht 68.0 in | Wt 203.0 lb

## 2020-03-16 DIAGNOSIS — R0789 Other chest pain: Secondary | ICD-10-CM | POA: Diagnosis not present

## 2020-03-16 DIAGNOSIS — I5032 Chronic diastolic (congestive) heart failure: Secondary | ICD-10-CM

## 2020-03-16 DIAGNOSIS — I2510 Atherosclerotic heart disease of native coronary artery without angina pectoris: Secondary | ICD-10-CM

## 2020-03-16 DIAGNOSIS — I480 Paroxysmal atrial fibrillation: Secondary | ICD-10-CM

## 2020-03-16 DIAGNOSIS — E785 Hyperlipidemia, unspecified: Secondary | ICD-10-CM

## 2020-03-16 DIAGNOSIS — I10 Essential (primary) hypertension: Secondary | ICD-10-CM

## 2020-03-16 NOTE — Progress Notes (Signed)
Internal Medicine Clinic Attending  Case discussed with Dr. Lee  At the time of the visit.  We reviewed the resident's history and exam and pertinent patient test results.  I agree with the assessment, diagnosis, and plan of care documented in the resident's note.    

## 2020-03-16 NOTE — Patient Instructions (Signed)
Medication Instructions:  Your physician has recommended you make the following change in your medication:   1) Start Pepcid 20 mg, 1 tablet by mouth twice a day for 2 weeks, then take as needed  *If you need a refill on your cardiac medications before your next appointment, please call your pharmacy*  Lab Work: Your physician recommends that you return for lab work in: 1 week for CMET and lipids  Testing/Procedures: Your physician has requested that you have an echocardiogram. Echocardiography is a painless test that uses sound waves to create images of your heart. It provides your doctor with information about the size and shape of your heart and how well your heart's chambers and valves are working. This procedure takes approximately one hour. There are no restrictions for this procedure.  Follow-Up: At [facility], you and your health needs are our priority.  As part of our continuing mission to provide you with exceptional heart care, we have created designated Provider Care Teams.  These Care Teams include your primary Cardiologist (physician) and Advanced Practice Providers (APPs -  Physician Assistants and Nurse Practitioners) who all work together to provide you with the care you need, when you need it.  Your next appointment:   6 week(s)  The format for your next appointment:   In Person  Provider:   Verne Carrow, MD  Other Instructions Weigh yourself daily, if weight goes up 3 pounds in one day take Lasix. If weight goes up and you are short of breath take Lasix and call. Check blood pressure and call if blood pressure is 130/80 or higher consistently.

## 2020-03-16 NOTE — Telephone Encounter (Signed)
Marylu Lund, do you have any suggestions?

## 2020-03-16 NOTE — Telephone Encounter (Signed)
If the patient insurance information is correct, Medicare (traditional) will only pay for SNF care after a qualifying 3 days (overnight) hospital stay. That is not true for Managed Medicare plans but he appears to have traditional Medicare. Marylu Lund

## 2020-03-16 NOTE — Progress Notes (Signed)
Cardiology Office Note:    Date:  03/16/2020   ID:  [name], DOB February 06, 1951, MRN [id number]  PCP:  [name], MD  [facility] Cardiologist:  Lauree Chandler, MD   [place] Electrophysiologist:  None   Referring MD: [name], MD   Chief Complaint:  Follow-up (CAD, HTN, chest pain, recent [place] admission)    Patient Profile:    Mark Cabrera is a 70 y.o. 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 A drug eluting stent was placed in the mid LAD (2.75 x 32 mm Synergy DES) and a drug eluting stent was placed in the body of the SVG to the PDA (3.5 x 20 mm Synergy DES).    Pre-CABG Dopplers 03/16/2016 Bilateral ICA 1-39  History of Present Illness:    Mark Cabrera was last seen by Dr. [name] in 04/2019.  He returns for f/u. He had Covid last month and was admitted to the hospital. He has been slow to recover since that time. His blood pressure has been running low. His PCP took him off of lisinopril and furosemide. His daughter  is with him today and wanted him to come in because he was taken off of some of his heart medications. He has not had orthopnea. His breathing is improved. He has noted some discomfort in his chest described as pressure when he lays down at night. Laying back further seems to help this. Sitting up does not really make a difference. He denies pleuritic symptoms. He has not had symptoms reminiscent of his previous angina. He has not had syncope or leg swelling.      Past Medical History:  Diagnosis Date  . Acute diastolic congestive heart failure ([employer])   . Aortic valve stenosis s/p AVR 2018  . Atrial fibrillation ([place]) - post-op CABG    04/2016 CHA2DS2VAS score = 5  . Atypical nevi   . Coronary artery disease s/p 2 vessel CABG   . Depression    "years ago"  . Diabetes mellitus   . Dyspnea    in the past   . GERD (gastroesophageal reflux disease)   . Heart murmur   . Hepatic cirrhosis ([place]) 06/29/2017  . History of kidney stones   . Hyperlipidemia    hx of transaminitis secondary to statin and he has decided not to use statins secondary to potential side effects.  . Hypertension   . Nephrolithiasis   . Osteoarthritis, knee   .  Sleep apnea     Central apnea. Not using cpap  . Stroke [facility]) 2007  . Transaminitis     Statin-induced  . 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 2-D echo was normal no embolic source found.    Current Medications: Current Meds  Medication Sig  . acetaminophen (TYLENOL) 325 MG tablet Take 2 tablets (650 mg total) by mouth every 4 (four) hours as needed for headache or mild pain.  Marland Kitchen albuterol (VENTOLIN HFA) 108 (90 Base) MCG/ACT inhaler Inhale 2 puffs into the lungs every 6 (six)  hours.  Marland Kitchen apixaban (ELIQUIS) 5 MG TABS tablet Take 1 tablet (5 mg total) by mouth 2 (two) times daily.  Ramond Dial Microlet Lancets lancets Check blood sugar 3 times a day as instructed  . Bempedoic Acid-Ezetimibe (NEXLIZET) 180-10 MG TABS Take 1 tablet by mouth daily.  Marland Kitchen escitalopram (LEXAPRO) 10 MG tablet Take 1 tablet (10 mg total) by mouth at bedtime.  . Evolocumab 140 MG/ML SOAJ Inject 1 pen into the skin every 14 (fourteen) days.  Marland Kitchen glucose blood (CONTOUR NEXT TEST) test strip Check blood sugar 3 times a day as instructed  . insulin aspart (NOVOLOG) 100 UNIT/ML FlexPen Inject 18 Units into the skin 3 (three) times daily with meals.  . Insulin Pen Needle 31G X 5 MM MISC Use one pen needle three times daily. Dx E11.49  . metFORMIN (GLUCOPHAGE) 500 MG tablet Take 1 tablet (500 mg total) by mouth 2 (two) times daily with a meal.     Allergies:   Morpholine salicylate, Penicillins, Morphine and related, Sglt2 inhibitors, and Statins   Social History   Tobacco Use  . Smoking status: Never Smoker  . Smokeless tobacco: Never Used  Vaping Use  . Vaping Use: Never used  Substance Use Topics  . Alcohol use: Yes    Alcohol/week: 1.0 standard drink    Types: 1 Cans of beer per week    Comment: occasional  . Drug use: No     Family Hx: The patient's family history includes Alcohol abuse in his father; Diabetes in his mother; Hypertension in his mother.  ROS   EKGs/Labs/Other Test Reviewed:    EKG:  EKG is   ordered today.  The ekg ordered today demonstrates NSR, HR 79, left axis deviation, PVCs, IVCD, QTC 458, similar to prior tracings  Recent Labs: 02/19/2020: B Natriuretic Peptide 111.8 03/10/2020: ALT 20; BUN 15; Creatinine, Ser 1.06; Hemoglobin 10.7; Platelets 340; Potassium 3.7; Sodium 138   Recent Lipid Panel Lab Results  Component Value Date/Time   CHOL 229 (H) 05/13/2019 05:03 PM   TRIG 103 02/22/2020 12:24 PM   HDL 49 05/13/2019 05:03 PM   CHOLHDL 4.7 05/13/2019 05:03 PM    CHOLHDL 6.1 06/29/2018 04:20 AM   LDLCALC 126 (H) 05/13/2019 05:03 PM      Risk Assessment/Calculations:     CHA2DS2-VASc Score = 7  This indicates a 11.2% annual risk of stroke. The patient's score is based upon: CHF History: Yes HTN History: Yes Diabetes History: Yes Stroke History: Yes Vascular Disease History: Yes Age Score: 1 Gender Score: 0     Physical Exam:    VS:  BP 100/62   Pulse 87   Ht 5'  8" (1.727 m)   Wt 203 lb (92.1 kg)   SpO2 95%   BMI 30.87 kg/m     Wt Readings from Last 3 Encounters:  03/16/20 203 lb (92.1 kg)  03/10/20 193 lb (87.5 kg)  02/23/20 195 lb 12.3 oz (88.8 kg)     Constitutional:      Appearance: Healthy appearance. Not in distress.  Neck:     Vascular: No JVR. JVD normal.  Pulmonary:     Effort: Pulmonary effort is normal.     Breath sounds: No wheezing. No rales.  Cardiovascular:     Normal rate. Regular rhythm. Normal S1. Normal S2.     Murmurs: There is a grade 2/6 systolic murmur at the URSB.  Edema:    Peripheral edema absent.  Abdominal:     Palpations: Abdomen is soft. There is no hepatomegaly.  Skin:    General: Skin is warm and dry.  Neurological:     General: No focal deficit present.     Mental Status: Alert and oriented to person, place and time.     Cranial Nerves: Cranial nerves are intact.       ASSESSMENT & PLAN:    1. Other chest pain His symptoms are somewhat atypical for ischemia. He mainly has symptoms with laying flat. However, he does not really have symptoms of pericarditis. Although this is certainly a concern with his recent history of COVID-19. He does not have a rub on exam. His electrocardiogram does not have any ST elevation. Question if he may be having some acid reflux symptoms. I have asked him to take Pepcid 20 mg twice daily for 2 weeks. I will have him undergo an echocardiogram to assess for pericardial effusion. He should not have this as he did have a window placed after his bypass  surgery. I'll make sure he has follow-up with Dr. [name] in 6 weeks. He knows to contact us if he has worsening symptoms prior to that.  2. Coronary artery disease involving native coronary artery of native heart without angina pectoris Hx of CABG in 2018.  S/p NSTEMI in 2020 tx with DES to SVG-PDA and DES to the LAD. Current symptoms are not consistent with angina. However, if they continue, we may need to consider stress testing. He does have issues with epistaxis. Therefore, continue apixaban alone.  3. Chronic heart failure with preserved ejection fraction ([place]) He was taken off of his furosemide by his PCP recently due to low blood pressure. We discussed the importance of daily weights and when to take furosemide. He knows to contact us if he develops weight gain and shortness of breath.  4. Paroxysmal atrial fibrillation (HCC) He seems to be tolerating Apixaban.  5. Essential hypertension Blood pressure has been running somewhat low. I have asked him to monitor his blood pressure at home and let us know if his pressures are >130/80. At that point, we can resume his lisinopril.  6. Dyslipidemia Continue bempedoic acid/ezetimibe and Alirocumab. Arrange fasting CMET, lipids.     Dispo:  Return in about 6 weeks (around 04/27/2020) for Routine Follow Up w/ Dr. [name].   Medication Adjustments/Labs and Tests Ordered: Current medicines are reviewed at length with the patient today.  Concerns regarding medicines are outlined above.  Tests Ordered: Orders Placed This Encounter  Procedures  . Comprehensive metabolic panel  . Lipid panel  . EKG 12-Lead  . ECHOCARDIOGRAM COMPLETE   Medication Changes: No orders of the defined types were placed in  this encounter.   Signed, Richardson Dopp, PA-C  03/16/2020 5:23 PM    [facility] [address], [place], [place]  [address] Phone: [phone]; Fax: [phone]

## 2020-03-17 NOTE — Telephone Encounter (Signed)
Okay I will update the family. Appreciate your help.

## 2020-03-18 ENCOUNTER — Ambulatory Visit (INDEPENDENT_AMBULATORY_CARE_PROVIDER_SITE_OTHER): Payer: Medicare Other | Admitting: Nurse Practitioner

## 2020-03-18 VITALS — BP 128/84 | HR 85 | Temp 97.0°F | Ht 68.0 in | Wt 202.0 lb

## 2020-03-18 DIAGNOSIS — J1282 Pneumonia due to coronavirus disease 2019: Secondary | ICD-10-CM

## 2020-03-18 DIAGNOSIS — J9601 Acute respiratory failure with hypoxia: Secondary | ICD-10-CM | POA: Insufficient documentation

## 2020-03-18 DIAGNOSIS — R5383 Other fatigue: Secondary | ICD-10-CM | POA: Insufficient documentation

## 2020-03-18 DIAGNOSIS — U071 COVID-19: Secondary | ICD-10-CM

## 2020-03-18 NOTE — Assessment & Plan Note (Signed)
Cough:   Stay well hydrated  Stay active  Deep breathing exercises  May take tylenol for fever or pain  May take mucinex twice daily   Physical deconditioning Memory loss:  Will place referral to PT, OT, and Speech for cognitive rehabilitation    Follow up:  Follow up in 2 weeks or sooner if needed - will need repeat imaging

## 2020-03-18 NOTE — Progress Notes (Signed)
@Patient  ID: Mark Cabrera, male    DOB: 07/15/1950, 70 y.o.   MRN: [id number]  Chief Complaint  Patient presents with   New Patient (Initial Visit)    COVID hospitalized 12/12-12/14. Referred by PCP. Having a lot of weakness. 4L at night and throughout the day PRN. O2 levels 95%98% off oxygen.     Referring provider: [name], MD   70 year old male with history of hypertension, CHF, A. fib, CAD, diabetes, dyslipidemia.  HPI  Patient presents today for post COVID care clinic visit/hospital follow-up.  Patient was seen by PCP after hospital discharge but was referred here.  Patient was admitted to the hospital on 02/22/2020 and discharged on 02/24/2020.  He was found to have Covid pneumonia and acute hypoxic respiratory failure.  He was discharged home on 4 L of O2.  Patient has been checking O2 sats at home and states that they have been staying in the high 90s on room air.  He does wear his oxygen at night when he sleeps.  Patient states that he does have his history of sleep apnea but was unable to get CPAP machine due to living conditions.  He states that for a time period he was living in a barn with no electricity.  Since being discharged from the hospital he has went to live with his sister.  He is going with his daughter today to see about being placed in assisted living.  Patient states that he has been recovering well from Covid.  He is still fatigued and feels like he could benefit from physical therapy.  He is also having memory loss.  We discussed that we can refer him to PT, OT, speech therapy for cognitive rehabilitation. Denies f/c/s, n/v/d, hemoptysis, PND, chest pain or edema.      Allergies  Allergen Reactions   Morpholine Salicylate Other (See Comments)    Hallucinations   Penicillins Other (See Comments)    Allergic- reaction?? Has patient had a PCN reaction causing immediate rash, facial/tongue/throat swelling, SOB or lightheadedness with hypotension:  Unk Has patient had a PCN reaction causing severe rash involving mucus membranes or skin necrosis: Unk Has patient had a PCN reaction that required hospitalization: Unk Has patient had a PCN reaction occurring within the last 10 years: No If all of the above answers are "NO", then may proceed with Cephalosporin use.  Other reaction(s): Unknown   Morphine And Related Other (See Comments)    Hallucinations    Sglt2 Inhibitors Rash and Other (See Comments)    Candidiasis infection prone   Statins Rash and Other (See Comments)    Severe rash and back pain, and muscle pain    Immunization History  Administered Date(s) Administered   Influenza Split 12/31/2005, 01/01/2007, 05/02/2010, 04/17/2011   Influenza Whole 01/03/2006, 01/01/2007   Influenza,inj,Quad PF,6+ Mos 12/03/2019   PPD Test 11/19/2018   Pneumococcal Conjugate-13 01/19/2014   Pneumococcal Polysaccharide-23 04/17/2011   Td 05/18/2006   Tdap 05/18/2006, 12/14/2016    Past Medical History:  Diagnosis Date   Acute diastolic congestive heart failure (HCC)    Aortic valve stenosis s/p AVR 2018   Atrial fibrillation ([place]) - post-op CABG    04/2016 CHA2DS2VAS score = 5   Atypical nevi    Coronary artery disease s/p 2 vessel CABG    Depression    "years ago"   Diabetes mellitus    Dyspnea    in the past    GERD (gastroesophageal reflux disease)  Heart murmur    Hepatic cirrhosis (HCC) 06/29/2017   History of kidney stones    Hyperlipidemia    hx of transaminitis secondary to statin and he has decided not to use statins secondary to potential side effects.   Hypertension    Nephrolithiasis    Osteoarthritis, knee    Sleep apnea     Central apnea. 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 MRI of the brain 2007 showed acute left lateral medullary  infarct in the distribution of left posterior inferior cerebral artery , narrowing of the left vertebral with severe diminution of flow or acute occlusion. 2-D echo was normal no embolic source found.    Tobacco History: Social History   Tobacco Use  Smoking Status Never Smoker  Smokeless Tobacco Never Used   Counseling given: Yes   Outpatient Encounter Medications as of 03/18/2020  Medication Sig   acetaminophen (TYLENOL) 325 MG tablet Take 2 tablets (650 mg total) by mouth every 4 (four) hours as needed for headache or mild pain.   albuterol (VENTOLIN HFA) 108 (90 Base) MCG/ACT inhaler Inhale 2 puffs into the lungs every 6 (six) hours.   apixaban (ELIQUIS) 5 MG TABS tablet Take 1 tablet (5 mg total) by mouth 2 (two) times daily.   Bayer Microlet Lancets lancets Check blood sugar 3 times a day as instructed   Bempedoic Acid-Ezetimibe (NEXLIZET) 180-10 MG TABS Take 1 tablet by mouth daily.   escitalopram (LEXAPRO) 10 MG tablet Take 1 tablet (10 mg total) by mouth at bedtime.   Evolocumab 140 MG/ML SOAJ Inject 1 pen into the skin every 14 (fourteen) days.   glucose blood (CONTOUR NEXT TEST) test strip Check blood sugar 3 times a day as instructed   insulin aspart (NOVOLOG) 100 UNIT/ML FlexPen Inject 18 Units into the skin 3 (three) times daily with meals.   Insulin Pen Needle 31G X 5 MM MISC Use one pen needle three times daily. Dx E11.49   metFORMIN (GLUCOPHAGE) 500 MG tablet Take 1 tablet (500 mg total) by mouth 2 (two) times daily with a meal.   No facility-administered encounter medications on file as of 03/18/2020.     Review of Systems  Review of Systems  Constitutional: Positive for fatigue. Negative for fever.  HENT: Negative.   Respiratory: Positive for shortness of breath. Negative for cough.   Cardiovascular: Negative.  Negative for chest pain, palpitations and leg swelling.  Gastrointestinal: Negative.   Allergic/Immunologic: Negative.   Neurological:  Positive for weakness.  Psychiatric/Behavioral: Negative.        Physical Exam  BP 128/84 (BP Location: Left Arm)    Pulse 85    Temp (!) 97 F (36.1 C)    Ht 5\' 8"  (1.727 m)    Wt 202 lb 0.1 oz (91.6 kg)    SpO2 97%    BMI 30.71 kg/m   Wt Readings from Last 5 Encounters:  03/18/20 202 lb 0.1 oz (91.6 kg)  03/16/20 203 lb (92.1 kg)  03/10/20 193 lb (87.5 kg)  02/23/20 195 lb 12.3 oz (88.8 kg)  12/03/19 220 lb 11.2 oz (100.1 kg)     Physical Exam Vitals and nursing note reviewed.  Constitutional:      General: He is not in acute distress.    Appearance: He is well-developed and well-nourished.  Eyes:  Comments: Patient wears patch to left eye.   Cardiovascular:     Rate and Rhythm: Normal rate and regular rhythm.  Pulmonary:     Effort: Pulmonary effort is normal.     Breath sounds: Normal breath sounds.  Skin:    General: Skin is warm and dry.  Neurological:     Mental Status: He is alert and oriented to person, place, and time.  Psychiatric:        Mood and Affect: Mood and affect normal.       Imaging: CT Angio Chest PE W/Cm &/Or Wo Cm  Result Date: 02/19/2020 CLINICAL DATA:  COVID.  Body aches, chills, cough EXAM: CT ANGIOGRAPHY CHEST WITH CONTRAST TECHNIQUE: Multidetector CT imaging of the chest was performed using the standard protocol during bolus administration of intravenous contrast. Multiplanar CT image reconstructions and MIPs were obtained to evaluate the vascular anatomy. CONTRAST:  130mL OMNIPAQUE IOHEXOL 350 MG/ML SOLN COMPARISON:  12/06/2016 FINDINGS: Cardiovascular: No filling defects in the pulmonary arteries to suggest pulmonary emboli. Prior CABG. Heart is mildly enlarged. Aorta normal caliber. Mediastinum/Nodes: No mediastinal, hilar, or axillary adenopathy. Trachea and esophagus are unremarkable. Thyroid unremarkable. Lungs/Pleura: Extensive peripheral ground-glass opacities bilaterally compatible with COVID pneumonia. No effusions. Upper  Abdomen: Imaging into the upper abdomen demonstrates no acute findings. Nodular contours of the liver again noted compatible with cirrhosis. Musculoskeletal: Mild bilateral gynecomastia. No acute bony abnormality. Review of the MIP images confirms the above findings. IMPRESSION: No evidence of pulmonary embolus. Extensive peripheral bilateral airspace opacities most compatible with COVID pneumonia. Prior CABG. Cirrhosis Aortic Atherosclerosis (ICD10-I70.0). Electronically Signed   By: Rolm Baptise M.D.   On: 02/19/2020 15:16   DG Chest Portable 1 View  Result Date: 02/22/2020 CLINICAL DATA:  Shortness of breath EXAM: PORTABLE CHEST 1 VIEW COMPARISON:  02/19/2020 FINDINGS: Status post aortic valve replacement, CABG, and left atrial appendage clipping. Stable mild cardiomegaly. Atherosclerotic calcification of the aortic knob. Persistent patchy airspace opacities, predominantly within the peripheral aspects of the mid to lower lung fields bilaterally. Findings are similar in appearance to the prior study. No large pleural fluid collection. No pneumothorax. IMPRESSION: No significant interval change in bilateral patchy airspace opacities. Electronically Signed   By: Davina Poke D.O.   On: 02/22/2020 12:57   DG Chest Port 1 View  Result Date: 02/19/2020 CLINICAL DATA:  sob EXAM: PORTABLE CHEST 1 VIEW COMPARISON:  02/13/2020 and prior. FINDINGS: Hypoinflated lungs with patchy bibasilar opacities. Trace right pleural effusion. No pneumothorax. Stable postprocedural appearance of the cardiomediastinal silhouette. No acute osseous abnormality. IMPRESSION: New patchy bibasilar opacities and trace right pleural effusion. Differential includes atelectasis, edema or infection. Electronically Signed   By: Primitivo Gauze M.D.   On: 02/19/2020 13:34     Assessment & Plan:   Pneumonia due to COVID-19 virus Cough:   Stay well hydrated  Stay active  Deep breathing exercises  May take tylenol for  fever or pain  May take mucinex twice daily   Physical deconditioning Memory loss:  Will place referral to PT, OT, and Speech for cognitive rehabilitation    Follow up:  Follow up in 2 weeks or sooner if needed - will need repeat imaging      Fenton Foy, NP 03/18/2020

## 2020-03-18 NOTE — Patient Instructions (Signed)
Covid 19 Cough:   Stay well hydrated  Stay active  Deep breathing exercises  May take tylenol for fever or pain  May take mucinex twice daily   Physical deconditioning Memory loss:  Will place referral to PT, OT, and Speech for cognitive rehabilitation    Follow up:  Follow up in 2 weeks or sooner if needed - will need repeat imaging

## 2020-03-19 ENCOUNTER — Telehealth: Payer: Self-pay

## 2020-03-19 NOTE — Telephone Encounter (Signed)
Received TC from patient who states his left testicle is swollen , double the size, X1week.  C/O tenderness and throbbing in the testicle which has been keeping him up at night.  He states this happened many years ago in [place] and his physician gave him ABX's.  RN informed patient he would need to be seen in person for evaluation.  [facility] is full this morning and has no openings.  RN instructed patient to present to urgent care for evaluation, he verbalized understanding and is in agreement.  He states he will go to urgent care now. SChaplin, RN,BSN

## 2020-03-25 ENCOUNTER — Telehealth: Payer: Self-pay | Admitting: *Deleted

## 2020-03-25 ENCOUNTER — Encounter: Payer: Medicare Other | Admitting: Student

## 2020-03-25 NOTE — Telephone Encounter (Signed)
Pt missed appt scheduled for today with [facility].Pt now resides at [facility] [phone]) and they were to provide transportation to this appt, but did not.  Pt has requsted that appt be rescheduled-CMA attempted to reschedule appt through [facility], but was instructed to call back tomorrow after 9am.  Will send request to front office to assist with arranging appt.Despina Hidden Cassady1/13/20224:30 PM

## 2020-03-26 ENCOUNTER — Other Ambulatory Visit: Payer: Medicare Other

## 2020-03-26 DIAGNOSIS — Z03818 Encounter for observation for suspected exposure to other biological agents ruled out: Secondary | ICD-10-CM | POA: Diagnosis not present

## 2020-03-31 ENCOUNTER — Telehealth: Payer: Self-pay | Admitting: *Deleted

## 2020-03-31 NOTE — Telephone Encounter (Signed)
Received faxed FL2 and addendum from [facility]. Placed in Yellow Team's box for completion. Hubbard Hartshorn, BSN, RN-BC

## 2020-04-01 ENCOUNTER — Telehealth: Payer: Self-pay | Admitting: Nurse Practitioner

## 2020-04-01 ENCOUNTER — Encounter: Payer: Medicare Other | Admitting: Nurse Practitioner

## 2020-04-01 DIAGNOSIS — Z03818 Encounter for observation for suspected exposure to other biological agents ruled out: Secondary | ICD-10-CM | POA: Diagnosis not present

## 2020-04-01 NOTE — Telephone Encounter (Signed)
Attempted to call patient x5 attempts for televisit. No answer. Will try to reschedule appointment to in person next week.

## 2020-04-01 NOTE — Progress Notes (Unsigned)
Erroneous encounter This encounter was created in error - please disregard. 

## 2020-04-02 ENCOUNTER — Other Ambulatory Visit (HOSPITAL_COMMUNITY): Payer: Medicare Other

## 2020-04-02 NOTE — Telephone Encounter (Signed)
Received call from [place] at [facility]. States she needs the [facility] faxed to her today. Hubbard Hartshorn, BSN, RN-BC

## 2020-04-02 NOTE — Telephone Encounter (Signed)
Spoke with Ander Purpura.  No appt needed to resch at this time.  Pt had a FL2@ form that was needed.  Form has been faxed per Lauren today.

## 2020-04-02 NOTE — Telephone Encounter (Signed)
Completed FL2 faxed to [facility] at [phone]. Fax confirmation receipt received. Hubbard Hartshorn, BSN, RN-BC

## 2020-04-05 ENCOUNTER — Telehealth: Payer: Self-pay

## 2020-04-05 NOTE — Telephone Encounter (Signed)
Received fax from [place] at [facility] with requested changes to [facility] submitted on 04/02/2020. Form with requested changes placed on Dr. Lenis Noon desk. Hubbard Hartshorn, BSN, RN-BC

## 2020-04-05 NOTE — Telephone Encounter (Signed)
Miss Mark Cabrera  From [name] is needed a call back about the paperwork she sent over on 04/02/20

## 2020-04-08 ENCOUNTER — Ambulatory Visit: Payer: Medicare Other

## 2020-04-09 ENCOUNTER — Other Ambulatory Visit: Payer: Medicare Other | Admitting: *Deleted

## 2020-04-09 ENCOUNTER — Other Ambulatory Visit: Payer: Self-pay

## 2020-04-09 DIAGNOSIS — I10 Essential (primary) hypertension: Secondary | ICD-10-CM | POA: Diagnosis not present

## 2020-04-09 DIAGNOSIS — E785 Hyperlipidemia, unspecified: Secondary | ICD-10-CM

## 2020-04-09 DIAGNOSIS — I2510 Atherosclerotic heart disease of native coronary artery without angina pectoris: Secondary | ICD-10-CM

## 2020-04-09 DIAGNOSIS — R0789 Other chest pain: Secondary | ICD-10-CM

## 2020-04-09 DIAGNOSIS — I480 Paroxysmal atrial fibrillation: Secondary | ICD-10-CM | POA: Diagnosis not present

## 2020-04-09 LAB — LIPID PANEL
Chol/HDL Ratio: 2.7 ratio (ref 0.0–5.0)
Cholesterol, Total: 133 mg/dL (ref 100–199)
HDL: 49 mg/dL (ref >39)
LDL Chol Calc (NIH): 68 mg/dL (ref 0–99)
Triglycerides: 79 mg/dL (ref 0–149)
VLDL Cholesterol Cal: 16 mg/dL (ref 5–40)

## 2020-04-09 LAB — COMPREHENSIVE METABOLIC PANEL
ALT: 16 IU/L (ref 0–44)
AST: 23 IU/L (ref 0–40)
Albumin/Globulin Ratio: 1.6 (ref 1.2–2.2)
Albumin: 4 g/dL (ref 3.7–4.7)
Alkaline Phosphatase: 50 IU/L (ref 44–121)
BUN/Creatinine Ratio: 14 (ref 10–24)
BUN: 11 mg/dL (ref 8–27)
Bilirubin Total: 0.5 mg/dL (ref 0.0–1.2)
CO2: 24 mmol/L (ref 20–29)
Calcium: 8.7 mg/dL (ref 8.6–10.2)
Chloride: 106 mmol/L (ref 96–106)
Creatinine, Ser: 0.8 mg/dL (ref 0.76–1.27)
GFR calc Af Amer: 104 mL/min/{1.73_m2} (ref >59)
GFR calc non Af Amer: 90 mL/min/{1.73_m2} (ref >59)
Globulin, Total: 2.5 g/dL (ref 1.5–4.5)
Glucose: 123 mg/dL — ABNORMAL HIGH (ref 65–99)
Potassium: 3.8 mmol/L (ref 3.5–5.2)
Sodium: 142 mmol/L (ref 134–144)
Total Protein: 6.5 g/dL (ref 6.0–8.5)

## 2020-04-12 ENCOUNTER — Telehealth: Payer: Self-pay

## 2020-04-12 NOTE — Telephone Encounter (Signed)
Left patient a message to call back regarding lab results 

## 2020-04-12 NOTE — Telephone Encounter (Signed)
-----   Message from Liliane Shi, [place] sent at 04/11/2020  8:08 AM EST ----- Lipids optimal.  Creatinine, K+, LFTs normal.   PLAN:  - Continue current medications/treatment plan and follow up as scheduled.  Richardson Dopp, PA-C    04/11/2020 8:03 AM

## 2020-04-14 ENCOUNTER — Ambulatory Visit (INDEPENDENT_AMBULATORY_CARE_PROVIDER_SITE_OTHER): Payer: Medicare Other | Admitting: Student

## 2020-04-14 ENCOUNTER — Other Ambulatory Visit (HOSPITAL_COMMUNITY)
Admission: RE | Admit: 2020-04-14 | Discharge: 2020-04-14 | Disposition: A | Discharge status: Home / Self Care | Payer: Medicare Other | Source: Ambulatory Visit | Attending: Student in an Organized Health Care Education/Training Program | Admitting: Student in an Organized Health Care Education/Training Program

## 2020-04-14 ENCOUNTER — Encounter: Payer: Self-pay | Admitting: Student

## 2020-04-14 ENCOUNTER — Other Ambulatory Visit: Payer: Self-pay

## 2020-04-14 ENCOUNTER — Other Ambulatory Visit (HOSPITAL_COMMUNITY): Payer: Self-pay | Admitting: Student

## 2020-04-14 VITALS — BP 140/93 | HR 73 | Temp 97.5°F | Ht 68.0 in | Wt 215.3 lb

## 2020-04-14 DIAGNOSIS — I5032 Chronic diastolic (congestive) heart failure: Secondary | ICD-10-CM | POA: Diagnosis not present

## 2020-04-14 DIAGNOSIS — N451 Epididymitis: Secondary | ICD-10-CM | POA: Insufficient documentation

## 2020-04-14 DIAGNOSIS — I952 Hypotension due to drugs: Secondary | ICD-10-CM | POA: Diagnosis not present

## 2020-04-14 DIAGNOSIS — I2510 Atherosclerotic heart disease of native coronary artery without angina pectoris: Secondary | ICD-10-CM

## 2020-04-14 MED ORDER — LEVOFLOXACIN 500 MG PO TABS: 500.0000 mg | ORAL_TABLET | Freq: Every day | ORAL | 0 refills | Status: DC

## 2020-04-14 MED ORDER — FUROSEMIDE 40 MG PO TABS: 40.0000 mg | ORAL_TABLET | Freq: Every day | ORAL | 11 refills | Status: DC

## 2020-04-14 MED ORDER — LEVOFLOXACIN 500 MG PO TABS: 500.0000 mg | ORAL_TABLET | Freq: Every day | ORAL | 0 refills | Status: AC

## 2020-04-14 MED FILL — levoFLOXacin 500 MG TABS: 500 | 10 days supply | Qty: 10 | Fill #0

## 2020-04-14 NOTE — Assessment & Plan Note (Signed)
Patient hypertensive today. Will restart meds one at a time. Start with lisinopril 40 mg daily in setting of htn and lower extremity swelling.

## 2020-04-14 NOTE — Assessment & Plan Note (Signed)
Patient endorsed persistent swelling of lower extremities. Taken off of furosemide secondary to hypotension. BP today of 140/93. Will resume furosemide 40 mg daily

## 2020-04-14 NOTE — Patient Instructions (Signed)
Thank you, Mr.Aloysious Royston Bake for allowing [place] to provide your care today. Today we discussed.    Testicle Pain I suspect what you have is epididymitis. We have prescribed you an antibiotic to take for 10 days. If you are not better after this course of antibiotic please call for a follow up visit. Please use tylenol for pain and you can use ice for any continued pain. Elevate your testicle to help relieve stress.   Leg Swelling We have restarted your lasix 40 mg daily.   Upset stomach/Diarrhea Please take peptobismol as needed.   I have ordered the following labs for you:   Lab Orders     Urinalysis, Complete (81001)    Referrals ordered today:   Referral Orders  No referral(s) requested today     I have ordered the following medication/changed the following medications:   Stop the following medications: Medications Discontinued During This Encounter  Medication Reason  . levofloxacin (LEVAQUIN) 500 MG tablet      Start the following medications: Meds ordered this encounter  Medications  . DISCONTD: levofloxacin (LEVAQUIN) 500 MG tablet    Sig: Take 1 tablet (500 mg total) by mouth daily for 10 days. [facility]    Dispense:  10 tablet    Refill:  0  . levofloxacin (LEVAQUIN) 500 MG tablet    Sig: Take 1 tablet (500 mg total) by mouth daily for 10 days. [facility]    Dispense:  10 tablet    Refill:  0  . furosemide (LASIX) 40 MG tablet    Sig: Take 1 tablet (40 mg total) by mouth daily.    Dispense:  30 tablet    Refill:  11     Follow up: 3 months    Remember: Call with any questions  Should you have any questions or concerns please call the internal medicine clinic at [phone].     Sanjuana Letters, D.O. [place]

## 2020-04-14 NOTE — Progress Notes (Signed)
CC: Left Testicle Pain  HPI:  [name] is a 70 y.o. male with a past medical history stated below and presents today for Left Testicle Pain. Please see problem based assessment and plan for additional details.  Past Medical History:  Diagnosis Date  . Acute diastolic congestive heart failure ([place])   . Aortic valve stenosis s/p AVR 2018  . Atrial fibrillation ([place]) - post-op CABG    04/2016 CHA2DS2VAS score = 5  . Atypical nevi   . Coronary artery disease s/p 2 vessel CABG   . Depression    "years ago"  . Diabetes mellitus   . Dyspnea    in the past   . GERD (gastroesophageal reflux disease)   . Heart murmur   . Hepatic cirrhosis ([place]) 06/29/2017  . History of kidney stones   . Hyperlipidemia    hx of transaminitis secondary to statin and he has decided not to use statins secondary to potential side effects.  . Hypertension   . Nephrolithiasis   . Osteoarthritis, knee   . Sleep apnea     Central apnea. Not using cpap  . Stroke [facility]) 2007  . Transaminitis     Statin-induced  . 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 2-D echo was normal no embolic source found.    Current Outpatient Medications on File Prior to Visit  Medication Sig Dispense Refill  . acetaminophen (TYLENOL) 325 MG tablet Take 2 tablets (650 mg total) by mouth every 4 (four) hours as needed for headache or mild pain.    Marland Kitchen albuterol (VENTOLIN HFA) 108 (90 Base) MCG/ACT inhaler Inhale 2 puffs into the lungs every 6 (six) hours. 18 g 0  . apixaban (ELIQUIS) 5 MG TABS tablet Take 1 tablet (5 mg total) by mouth 2 (two) times daily. 60 tablet 3  . Bayer Microlet Lancets lancets Check blood sugar 3 times a day as instructed  100 each 12  . Bempedoic Acid-Ezetimibe (NEXLIZET) 180-10 MG TABS Take 1 tablet by mouth daily. 90 tablet 3  . escitalopram (LEXAPRO) 10 MG tablet Take 1 tablet (10 mg total) by mouth at bedtime. 30 tablet 3  . Evolocumab 140 MG/ML SOAJ Inject 1 pen into the skin every 14 (fourteen) days. 2 mL 6  . glucose blood (CONTOUR NEXT TEST) test strip Check blood sugar 3 times a day as instructed 100 each 12  . insulin aspart (NOVOLOG) 100 UNIT/ML FlexPen Inject 18 Units into the skin 3 (three) times daily with meals. 48.6 mL 0  . Insulin Pen Needle 31G X 5 MM MISC Use one pen needle three times daily. Dx E11.49 100 each 3  . metFORMIN (GLUCOPHAGE) 500 MG tablet Take 1 tablet (500 mg total) by mouth 2 (two) times daily with a meal. 90 tablet 3   No current facility-administered medications on file prior to visit.    Family History  Problem Relation Age of Onset  . Hypertension Mother   . Diabetes Mother   . Alcohol abuse Father     Social History   Socioeconomic History  . Marital status: Divorced    Spouse name: Not on file  . Number of children: 1  . Years of education: 2y college  .  Highest education level: Not on file  Occupational History  . Occupation:  Medicaid [place]    Employer: UNEMPLOYED    Comment: following stroke in 2007  Tobacco Use  . Smoking status: Never Smoker  . Smokeless tobacco: Never Used  Vaping Use  . Vaping Use: Never used  Substance and Sexual Activity  . Alcohol use: Yes    Alcohol/week: 1.0 standard drink    Types: 1 Cans of beer per week    Comment: occasional  . Drug use: No  . Sexual activity: Not Currently  Other Topics Concern  . Not on file  Social History Narrative   Single but has a girlfriend.    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       Patient has medications disability post stroke.            Social Determinants of Health   Financial Resource Strain: Not on file  Food Insecurity: Not  on file  Transportation Needs: Not on file  Physical Activity: Not on file  Stress: Not on file  Social Connections: Not on file  Intimate Partner Violence: Not on file    Review of Systems: ROS negative except for what is noted on the assessment and plan.  Vitals:   04/14/20 1417 04/14/20 1429  BP: (!) 144/75 (!) 140/93  Pulse: 75 73  Temp: (!) 97.5 F (36.4 C)   TempSrc: Oral   SpO2: 100%   Weight: 215 lb 4.8 oz (97.7 kg)   Height: 5\' 8"  (1.727 m)     Physical Exam: Constitutional: well-appearing, sitting in wheelchair HENT: normocephalic atraumatic Eyes: conjunctiva non-erythematous Neck: supple Cardiovascular: regular rate Pulmonary/Chest: normal work of breathing on room air MSK: normal bulk and tone, lower extremity trace edema Neurological: alert & oriented x 3 Skin: warm and dry Psych: normal mood GU: Left testicle with minimal swelling, tender to palpate over posterior portion of testicle, near epididymis. Testicle itself not tender. No erythema present.   Chaperone present for patient's exam  Assessment & Plan:   See Encounters Tab for problem based charting.  Patient seen with Dr. Newell Coral, D.O. [facility], PGY-1 Pager: [phone], Phone: [phone] Date 04/14/2020 Time 6:12 PM

## 2020-04-14 NOTE — Assessment & Plan Note (Signed)
Assessment: Patient presents with left testicular pain for the past few days. He notes this occurred 1-2 times a month for the past year. He endorsed similar symptoms 15 years ago where his testicle became the size of a softball and it was tender to touch. He was seen at that time, prescribed antibiotics, and the patient states that since this past year he has not had problems.   He has not been sexually active in three years. Denies difficulty urinating. Denies difficulty with ejaculation.  Exam consistent with epididymitis.   Plan: -levofloxacin 500 mg daily for 10 days -GC and urinalysis pending

## 2020-04-15 LAB — URINE CYTOLOGY ANCILLARY ONLY
Chlamydia: NEGATIVE
Comment: NEGATIVE
Comment: NORMAL
Neisseria Gonorrhea: NEGATIVE

## 2020-04-15 LAB — URINALYSIS, COMPLETE
Bilirubin, UA: NEGATIVE
Glucose, UA: NEGATIVE
Leukocytes,UA: NEGATIVE
Nitrite, UA: NEGATIVE
Protein,UA: NEGATIVE
RBC, UA: NEGATIVE
Specific Gravity, UA: 1.024 (ref 1.005–1.030)
Urobilinogen, Ur: 1 mg/dL (ref 0.2–1.0)
pH, UA: 6 (ref 5.0–7.5)

## 2020-04-15 LAB — MICROSCOPIC EXAMINATION
Bacteria, UA: NONE SEEN
Casts: NONE SEEN /lpf
WBC, UA: NONE SEEN /hpf (ref 0–5)

## 2020-04-16 DIAGNOSIS — H43811 Vitreous degeneration, right eye: Secondary | ICD-10-CM | POA: Diagnosis not present

## 2020-04-16 DIAGNOSIS — H3342 Traction detachment of retina, left eye: Secondary | ICD-10-CM | POA: Diagnosis not present

## 2020-04-16 DIAGNOSIS — T85398D Other mechanical complication of other ocular prosthetic devices, implants and grafts, subsequent encounter: Secondary | ICD-10-CM | POA: Diagnosis not present

## 2020-04-19 DIAGNOSIS — E119 Type 2 diabetes mellitus without complications: Secondary | ICD-10-CM | POA: Diagnosis not present

## 2020-04-19 DIAGNOSIS — E782 Mixed hyperlipidemia: Secondary | ICD-10-CM | POA: Diagnosis not present

## 2020-04-19 DIAGNOSIS — I10 Essential (primary) hypertension: Secondary | ICD-10-CM | POA: Diagnosis not present

## 2020-04-20 DIAGNOSIS — Z7901 Long term (current) use of anticoagulants: Secondary | ICD-10-CM | POA: Diagnosis not present

## 2020-04-20 DIAGNOSIS — Z7984 Long term (current) use of oral hypoglycemic drugs: Secondary | ICD-10-CM | POA: Diagnosis not present

## 2020-04-20 DIAGNOSIS — K7460 Unspecified cirrhosis of liver: Secondary | ICD-10-CM | POA: Diagnosis not present

## 2020-04-20 DIAGNOSIS — Z8616 Personal history of COVID-19: Secondary | ICD-10-CM | POA: Diagnosis not present

## 2020-04-20 DIAGNOSIS — I5032 Chronic diastolic (congestive) heart failure: Secondary | ICD-10-CM | POA: Diagnosis not present

## 2020-04-20 DIAGNOSIS — I480 Paroxysmal atrial fibrillation: Secondary | ICD-10-CM | POA: Diagnosis not present

## 2020-04-20 DIAGNOSIS — M150 Primary generalized (osteo)arthritis: Secondary | ICD-10-CM | POA: Diagnosis not present

## 2020-04-20 DIAGNOSIS — F32A Depression, unspecified: Secondary | ICD-10-CM | POA: Diagnosis not present

## 2020-04-20 DIAGNOSIS — Z794 Long term (current) use of insulin: Secondary | ICD-10-CM | POA: Diagnosis not present

## 2020-04-20 DIAGNOSIS — Z951 Presence of aortocoronary bypass graft: Secondary | ICD-10-CM | POA: Diagnosis not present

## 2020-04-20 DIAGNOSIS — I2510 Atherosclerotic heart disease of native coronary artery without angina pectoris: Secondary | ICD-10-CM | POA: Diagnosis not present

## 2020-04-20 DIAGNOSIS — I110 Hypertensive heart disease with heart failure: Secondary | ICD-10-CM | POA: Diagnosis not present

## 2020-04-20 DIAGNOSIS — E1142 Type 2 diabetes mellitus with diabetic polyneuropathy: Secondary | ICD-10-CM | POA: Diagnosis not present

## 2020-04-20 DIAGNOSIS — I4892 Unspecified atrial flutter: Secondary | ICD-10-CM | POA: Diagnosis not present

## 2020-04-20 DIAGNOSIS — F419 Anxiety disorder, unspecified: Secondary | ICD-10-CM | POA: Diagnosis not present

## 2020-04-20 DIAGNOSIS — Z79899 Other long term (current) drug therapy: Secondary | ICD-10-CM | POA: Diagnosis not present

## 2020-04-21 ENCOUNTER — Other Ambulatory Visit (HOSPITAL_COMMUNITY): Payer: Medicare Other

## 2020-04-22 DIAGNOSIS — F064 Anxiety disorder due to known physiological condition: Secondary | ICD-10-CM | POA: Diagnosis not present

## 2020-04-22 DIAGNOSIS — I2510 Atherosclerotic heart disease of native coronary artery without angina pectoris: Secondary | ICD-10-CM | POA: Diagnosis not present

## 2020-04-22 DIAGNOSIS — E1142 Type 2 diabetes mellitus with diabetic polyneuropathy: Secondary | ICD-10-CM | POA: Diagnosis not present

## 2020-04-22 DIAGNOSIS — M150 Primary generalized (osteo)arthritis: Secondary | ICD-10-CM | POA: Diagnosis not present

## 2020-04-22 DIAGNOSIS — I110 Hypertensive heart disease with heart failure: Secondary | ICD-10-CM | POA: Diagnosis not present

## 2020-04-22 DIAGNOSIS — F331 Major depressive disorder, recurrent, moderate: Secondary | ICD-10-CM | POA: Diagnosis not present

## 2020-04-22 DIAGNOSIS — F419 Anxiety disorder, unspecified: Secondary | ICD-10-CM | POA: Diagnosis not present

## 2020-04-22 DIAGNOSIS — I5032 Chronic diastolic (congestive) heart failure: Secondary | ICD-10-CM | POA: Diagnosis not present

## 2020-04-26 ENCOUNTER — Other Ambulatory Visit: Payer: Self-pay

## 2020-04-26 ENCOUNTER — Ambulatory Visit (INDEPENDENT_AMBULATORY_CARE_PROVIDER_SITE_OTHER): Payer: Medicare Other | Admitting: Podiatry

## 2020-04-26 ENCOUNTER — Encounter: Payer: Self-pay | Admitting: Podiatry

## 2020-04-26 DIAGNOSIS — M79675 Pain in left toe(s): Secondary | ICD-10-CM

## 2020-04-26 DIAGNOSIS — E1165 Type 2 diabetes mellitus with hyperglycemia: Secondary | ICD-10-CM | POA: Diagnosis not present

## 2020-04-26 DIAGNOSIS — E1142 Type 2 diabetes mellitus with diabetic polyneuropathy: Secondary | ICD-10-CM

## 2020-04-26 DIAGNOSIS — B351 Tinea unguium: Secondary | ICD-10-CM | POA: Diagnosis not present

## 2020-04-26 DIAGNOSIS — I110 Hypertensive heart disease with heart failure: Secondary | ICD-10-CM | POA: Diagnosis not present

## 2020-04-26 DIAGNOSIS — I2510 Atherosclerotic heart disease of native coronary artery without angina pectoris: Secondary | ICD-10-CM | POA: Diagnosis not present

## 2020-04-26 DIAGNOSIS — M150 Primary generalized (osteo)arthritis: Secondary | ICD-10-CM | POA: Diagnosis not present

## 2020-04-26 DIAGNOSIS — I5032 Chronic diastolic (congestive) heart failure: Secondary | ICD-10-CM | POA: Diagnosis not present

## 2020-04-26 DIAGNOSIS — M79674 Pain in right toe(s): Secondary | ICD-10-CM

## 2020-04-26 DIAGNOSIS — F419 Anxiety disorder, unspecified: Secondary | ICD-10-CM | POA: Diagnosis not present

## 2020-04-26 DIAGNOSIS — Z7901 Long term (current) use of anticoagulants: Secondary | ICD-10-CM | POA: Diagnosis not present

## 2020-04-26 NOTE — Progress Notes (Signed)
  Subjective:  Patient ID: Mark Cabrera, male    DOB: 10-Jul-1950,  MRN: [id number]  Chief Complaint  Patient presents with  . Nail Problem    Diabetic nail trim Possible nail fungus    70 y.o. male presents with the above complaint. History confirmed with patient. Lives in assisted living, they cut his nails every few months but they are thick and yellow and cause discomfort. He has vision issues and so has a hard time seeing to do himself. He is on eliquis.   Objective:  Physical Exam: warm, good capillary refill, no trophic changes or ulcerative lesions and normal DP and PT pulses. Abnormal sensory exam, he has loss of protective sensation to monofilament. Mycotic and dystrophic toenails (1-3 b/l, 4 and 5 appear clear) with yellow color and subungual debris Assessment:   1. Onychomycosis   2. Pain due to onychomycosis of toenails of both feet   3. Uncontrolled type 2 diabetes mellitus with hyperglycemia ([place])   4. Diabetic peripheral neuropathy ([place])   5. Long term (current) use of anticoagulants      Plan:  Patient was evaluated and treated and all questions answered.  Patient educated on diabetes. Discussed proper diabetic foot care and discussed risks and complications of disease. Educated patient in depth on reasons to return to the office immediately should he/she discover anything concerning or new on the feet. All questions answered. Discussed proper shoes as well.   Discussed the etiology and treatment options for the condition in detail with the patient. Educated patient on the topical and oral treatment options for mycotic nails. Recommended debridement of the nails today. Sharp and mechanical debridement performed of all painful and mycotic nails today. Nails debrided in length and thickness using a nail nipper to level of comfort. Discussed treatment options including appropriate shoe gear. Follow up as needed for painful nails.   Return in about 3 months (around  07/24/2020) for at risk diabetic foot care.

## 2020-04-26 NOTE — Progress Notes (Signed)
Internal Medicine Clinic Attending  Case discussed with Dr. Katsadouros  At the time of the visit.  We reviewed the resident's history and exam and pertinent patient test results.  I agree with the assessment, diagnosis, and plan of care documented in the resident's note.  

## 2020-04-29 ENCOUNTER — Other Ambulatory Visit: Payer: Self-pay

## 2020-04-29 ENCOUNTER — Ambulatory Visit (HOSPITAL_COMMUNITY): Payer: Medicare Other | Attending: Internal Medicine

## 2020-04-29 DIAGNOSIS — I2510 Atherosclerotic heart disease of native coronary artery without angina pectoris: Secondary | ICD-10-CM | POA: Diagnosis not present

## 2020-04-29 DIAGNOSIS — E1142 Type 2 diabetes mellitus with diabetic polyneuropathy: Secondary | ICD-10-CM | POA: Diagnosis not present

## 2020-04-29 DIAGNOSIS — R0789 Other chest pain: Secondary | ICD-10-CM | POA: Diagnosis not present

## 2020-04-29 DIAGNOSIS — F419 Anxiety disorder, unspecified: Secondary | ICD-10-CM | POA: Diagnosis not present

## 2020-04-29 DIAGNOSIS — I110 Hypertensive heart disease with heart failure: Secondary | ICD-10-CM | POA: Diagnosis not present

## 2020-04-29 DIAGNOSIS — I5032 Chronic diastolic (congestive) heart failure: Secondary | ICD-10-CM | POA: Diagnosis not present

## 2020-04-29 DIAGNOSIS — M150 Primary generalized (osteo)arthritis: Secondary | ICD-10-CM | POA: Diagnosis not present

## 2020-04-29 LAB — ECHOCARDIOGRAM COMPLETE
Area-P 1/2: 3.44 cm2
S' Lateral: 4 cm

## 2020-04-29 MED ORDER — PERFLUTREN LIPID MICROSPHERE
1.0000 mL | INTRAVENOUS | Status: AC | PRN
  Administered 2020-04-29: 3 mL via INTRAVENOUS

## 2020-04-30 ENCOUNTER — Telehealth: Payer: Self-pay

## 2020-04-30 ENCOUNTER — Encounter: Payer: Self-pay | Admitting: Physician Assistant

## 2020-04-30 ENCOUNTER — Other Ambulatory Visit (HOSPITAL_COMMUNITY): Payer: Medicare Other

## 2020-04-30 NOTE — Telephone Encounter (Signed)
RN called patient and discussed results:  The echocardiogram shows low normal heart function (ejection fraction). There is mild leakage of the mitral valve (mitral regurgitation). There is no fluid collection in the sac around the heart (no pericardial effusion).  PLAN:   Cont meds as prescribed, no new changes noted.

## 2020-04-30 NOTE — Telephone Encounter (Signed)
-----   Message from Liliane Shi, [place] sent at 04/30/2020  9:19 AM EST ----- The echocardiogram shows low normal heart function (ejection fraction).  There is mild leakage of the mitral valve (mitral regurgitation).  There is no fluid collection in the sac around the heart (no pericardial effusion). PLAN:  - Continue current medications/treatment plan and follow up as scheduled.  - Send a copy to PCP Richardson Dopp, PA-C    04/30/2020 9:15 AM

## 2020-05-01 ENCOUNTER — Telehealth: Payer: Self-pay | Admitting: Physician Assistant

## 2020-05-01 NOTE — Telephone Encounter (Signed)
   Patient's daughter called. Her father has been having problems with chest pain. He does not have any nitroglycerin ordered at the facility. He is in [facility].  I attempted to contact [facility] to order the nitroglycerin and get some help for the patient. However, I had to leave a message and I am still awaiting a callback. I will try again later.  I also called the pt cell phone, advised him that I was trying to get in touch with the staff but has been unsuccessful so far.  He is currently chest pain-free. I requested that if he gets more chest pain, he call 911 and let the staff know. I reiterated that if he gets additional chest pain since he had severe chest pain last night, he needs to be seen. He said he would do so.  Rosaria Ferries, PA-C 05/01/2020 10:50 AM

## 2020-05-03 ENCOUNTER — Other Ambulatory Visit: Payer: Self-pay

## 2020-05-03 ENCOUNTER — Ambulatory Visit (INDEPENDENT_AMBULATORY_CARE_PROVIDER_SITE_OTHER): Payer: Medicare Other | Admitting: Cardiovascular Disease

## 2020-05-03 ENCOUNTER — Encounter: Payer: Self-pay | Admitting: Cardiovascular Disease

## 2020-05-03 VITALS — BP 120/66 | HR 79 | Ht 68.0 in | Wt 208.0 lb

## 2020-05-03 DIAGNOSIS — E785 Hyperlipidemia, unspecified: Secondary | ICD-10-CM | POA: Diagnosis not present

## 2020-05-03 DIAGNOSIS — I480 Paroxysmal atrial fibrillation: Secondary | ICD-10-CM | POA: Diagnosis not present

## 2020-05-03 DIAGNOSIS — I25118 Atherosclerotic heart disease of native coronary artery with other forms of angina pectoris: Secondary | ICD-10-CM

## 2020-05-03 DIAGNOSIS — R197 Diarrhea, unspecified: Secondary | ICD-10-CM | POA: Diagnosis not present

## 2020-05-03 DIAGNOSIS — R112 Nausea with vomiting, unspecified: Secondary | ICD-10-CM | POA: Diagnosis not present

## 2020-05-03 DIAGNOSIS — I5032 Chronic diastolic (congestive) heart failure: Secondary | ICD-10-CM

## 2020-05-03 DIAGNOSIS — I2510 Atherosclerotic heart disease of native coronary artery without angina pectoris: Secondary | ICD-10-CM | POA: Diagnosis not present

## 2020-05-03 MED ORDER — ISOSORBIDE MONONITRATE ER 30 MG PO TB24: 30.0000 mg | ORAL_TABLET | Freq: Every day | ORAL | 3 refills | Status: DC

## 2020-05-03 MED ORDER — NITROGLYCERIN 0.4 MG SL SUBL: 0.4000 mg | SUBLINGUAL_TABLET | SUBLINGUAL | 3 refills | Status: DC | PRN

## 2020-05-03 NOTE — Patient Instructions (Signed)
Medication Instructions:  Your physician has recommended you make the following change in your medication:   START Imdur 30mg  daily  Nirtroglycerin 0.4mg  as needed   *If you need a refill on your cardiac medications before your next appointment, please call your pharmacy*   Lab Work: none If you have labs (blood work) drawn today and your tests are completely normal, you will receive your results only by: Marland Kitchen MyChart Message (if you have MyChart) OR . A paper copy in the mail If you have any lab test that is abnormal or we need to change your treatment, we will call you to review the results.   Testing/Procedures: none   Follow-Up: At [facility], you and your health needs are our priority.  As part of our continuing mission to provide you with exceptional heart care, we have created designated Provider Care Teams.  These Care Teams include your primary Cardiologist (physician) and Advanced Practice Providers (APPs -  Physician Assistants and Nurse Practitioners) who all work together to provide you with the care you need, when you need it.   Your next appointment:   6 month(s)  The format for your next appointment:   In Person  Provider:   You may see Lauree Chandler, MD or one of the following Advanced Practice Providers on your designated Care Team:    Melina Copa, PA-C  Ermalinda Barrios, PA-C

## 2020-05-03 NOTE — Progress Notes (Signed)
Chief Complaint  Patient presents with  . Follow-up    CAD/chest pain   History of Present Illness: 70 yo male with history of HTN, HLD, paroxysmal atrial fibrillation, DM, severe aortic stenosis now s/p AVR January 2018, CAD s/p 2V CABG January 2018, prior CVA in 2007 and in April 2020 here today for cardiac follow up. He was admitted to [facility] January 2018 and underwent surgical AVR for AS and 2V CABG (SVG to PDA and SVG to OM1) along with left atrial appendage clipping per Dr. Roxan Hockey. He has post-op atrial fib and volume overload. His post-operative course was also complicated by a large pericardial effusion and pleural effusion. He was readmitted and had thoracentesis and pericardial window. He had issues with volume overload post-op and was started on Lasix. Echo April 2020 with LVEF=50-55%, severe LVH, AV bioprosthetic valve working well. The left atrium was severely dilated. Ongoing chest pain since surgery. Sternal wire removal June 2019. Stroke April 2020 felt to be due to an embolic event. He was started on Eliquis. He was admitted to [facility] with chest pain 01/10/19. Cardiac cath 01/10/19 with occlusion of the SVG to the OM, severe stenosis in the body of the SVG to the PDA and CTO of the LAD. A drug eluting stent was placed in the mid LAD (2.75 x 32 mm Synergy DES) and a drug eluting stent was placed in the body of the SVG to the PDA (3.5 x 20 mm Synergy DES).  (Dr. Sindy Guadeloupe).  He was discharged on ASA and Plavix but both have been stopped. He was seen in the [facility] ED 02/20/19 with c/o chest pain but no evidence of ACS. Echo 04/11/19 with LVEF=50-55%, severe LVH, normally functioning AVR. He was seen in our office 03/16/20 by Richardson Dopp, PA-C and had c/o chest pain post Covid, not felt to be cardiac related. Echo 04/29/20 with LVEF=50-55%, mild MR, no pericardial effusion.   He is now living in assisted living in [place].   He is here today for follow up. He had one  episode of resting chest pain last week, lasted for an hour. This felt like indigestion. No recurrence over the past 4 days. He has no exertional chest pain. The patient denies any dyspnea, palpitations, lower extremity edema, orthopnea, PND, dizziness, near syncope or syncope.   Primary Care Physician: [name], MD  Past Medical History:  Diagnosis Date  . Acute diastolic congestive heart failure ([place])   . Aortic valve stenosis s/p AVR 2018   Echo 2/22: Poor acoustic windows, EF 50-55, mild LVH, normal RVSF, mild MR, trivial AI, no AS, no pericardial effusion  . Atrial fibrillation ([place]) - post-op CABG    04/2016 CHA2DS2VAS score = 5  . Atypical nevi   . Coronary artery disease s/p 2 vessel CABG   . Depression    "years ago"  . Diabetes mellitus   . Dyspnea    in the past   . GERD (gastroesophageal reflux disease)   . Heart murmur   . Hepatic cirrhosis ([place]) 06/29/2017  . History of kidney stones   . Hyperlipidemia    hx of transaminitis secondary to statin and he has decided not to use statins secondary to potential side effects.  . Hypertension   . Nephrolithiasis   . Osteoarthritis, knee   . Sleep apnea     Central apnea. Not using cpap  . Stroke [facility]) 2007  . Transaminitis     Statin-induced  .  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 2-D echo was normal no embolic source found.    Past Surgical History:  Procedure Laterality Date  . AORTIC VALVE REPLACEMENT N/A 03/30/2016   Procedure: AORTIC VALVE REPLACEMENT (AVR);  Surgeon: [name], MD;  Location: [place];  Service: Open Heart Surgery;  Laterality: N/A;  . CARDIAC CATHETERIZATION N/A 03/15/2016   Procedure: Right/Left Heart  Cath and Coronary Angiography;  Surgeon: Burnell Blanks, MD;  Location: [place] CV LAB;  Service: Cardiovascular;  Laterality: N/A;  . CLIPPING OF ATRIAL APPENDAGE N/A 03/30/2016   Procedure: CLIPPING OF LEFT ATRIAL APPENDAGE;  Surgeon: [name], MD;  Location: [place];  Service: Open Heart Surgery;  Laterality: N/A;  . CORONARY ARTERY BYPASS GRAFT N/A 03/30/2016   Procedure: CORONARY ARTERY BYPASS GRAFTING (CABG) Times Two;  Surgeon: [name], MD;  Location: [place];  Service: Open Heart Surgery;  Laterality: N/A;  . KNEE ARTHROSCOPY W/ PARTIAL MEDIAL MENISCECTOMY  05/12/2005   right, performed by Dr. French Ana for torn medial meniscus.  Marland Kitchen ROTATOR CUFF REPAIR Right 2016  . STERNAL WIRES REMOVAL N/A 08/13/2017   Procedure: STERNAL WIRES REMOVAL;  Surgeon: [name], MD;  Location: [place];  Service: Thoracic;  Laterality: N/A;  . SUBXYPHOID PERICARDIAL WINDOW N/A 04/21/2016   Procedure: SUBXYPHOID PERICARDIAL WINDOW;  Surgeon: [name], MD;  Location: [place];  Service: Thoracic;  Laterality: N/A;  . TEE WITHOUT CARDIOVERSION N/A 03/30/2016   Procedure: TRANSESOPHAGEAL ECHOCARDIOGRAM (TEE);  Surgeon: [name], MD;  Location: [place];  Service: Open Heart Surgery;  Laterality: N/A;  . TEE WITHOUT CARDIOVERSION N/A 04/21/2016   Procedure: TRANSESOPHAGEAL ECHOCARDIOGRAM (TEE);  Surgeon: [name], MD;  Location: [facility] OR;  Service: Thoracic;  Laterality: N/A;    Current Outpatient Medications  Medication Sig Dispense Refill  . acetaminophen (TYLENOL) 325 MG tablet Take 2 tablets (650 mg total) by mouth every 4 (four) hours as needed for headache or mild pain.    Marland Kitchen apixaban (ELIQUIS) 5 MG TABS tablet Take 1 tablet (5 mg total) by mouth 2 (two) times daily. 60 tablet 3  . Bayer Microlet Lancets lancets Check blood sugar 3 times a day as instructed 100 each 12  . Bempedoic Acid-Ezetimibe (NEXLIZET) 180-10 MG TABS Take 1 tablet by mouth  daily. 90 tablet 3  . escitalopram (LEXAPRO) 10 MG tablet Take 1 tablet (10 mg total) by mouth at bedtime. 30 tablet 3  . Evolocumab 140 MG/ML SOAJ Inject 1 pen into the skin every 14 (fourteen) days. 2 mL 6  . furosemide (LASIX) 40 MG tablet Take 1 tablet (40 mg total) by mouth daily. 30 tablet 11  . glucose blood (CONTOUR NEXT TEST) test strip Check blood sugar 3 times a day as instructed 100 each 12  . insulin aspart (NOVOLOG) 100 UNIT/ML FlexPen Inject 18 Units into the skin 3 (three) times daily with meals. 48.6 mL 0  . Insulin Pen Needle 31G X 5 MM MISC Use one pen needle three times daily. Dx E11.49 100 each 3  . isosorbide mononitrate (IMDUR) 30 MG 24 hr tablet Take 1 tablet (30 mg total) by mouth daily. 90 tablet 3  . metFORMIN (GLUCOPHAGE) 500  MG tablet Take 1 tablet (500 mg total) by mouth 2 (two) times daily with a meal. 90 tablet 3  . nitroGLYCERIN (NITROSTAT) 0.4 MG SL tablet Place 1 tablet (0.4 mg total) under the tongue every 5 (five) minutes as needed for chest pain. 90 tablet 3   No current facility-administered medications for this visit.    Allergies  Allergen Reactions  . Morpholine Salicylate Other (See Comments)    Hallucinations  . Penicillins Other (See Comments)    Allergic- reaction?? Has patient had a PCN reaction causing immediate rash, facial/tongue/throat swelling, SOB or lightheadedness with hypotension: Unk Has patient had a PCN reaction causing severe rash involving mucus membranes or skin necrosis: Unk Has patient had a PCN reaction that required hospitalization: Unk Has patient had a PCN reaction occurring within the last 10 years: No If all of the above answers are "NO", then may proceed with Cephalosporin use.  Other reaction(s): Unknown  . Morphine And Related Other (See Comments)    Hallucinations   . Sglt2 Inhibitors Rash and Other (See Comments)    Candidiasis infection prone  . Statins Rash and Other (See Comments)    Severe rash and back  pain, and muscle pain    Social History   Socioeconomic History  . Marital status: Divorced    Spouse name: Not on file  . Number of children: 1  . Years of education: 2y college  . Highest education level: Not on file  Occupational History  . Occupation:  Medicaid [place]    Employer: UNEMPLOYED    Comment: following stroke in 2007  Tobacco Use  . Smoking status: Never Smoker  . Smokeless tobacco: Never Used  Vaping Use  . Vaping Use: Never used  Substance and Sexual Activity  . Alcohol use: Yes    Alcohol/week: 1.0 standard drink    Types: 1 Cans of beer per week    Comment: occasional  . Drug use: No  . Sexual activity: Not Currently  Other Topics Concern  . Not on file  Social History Narrative   Single but has a girlfriend.    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       Patient has medications disability post stroke.            Social Determinants of Health   Financial Resource Strain: Not on file  Food Insecurity: Not on file  Transportation Needs: Not on file  Physical Activity: Not on file  Stress: Not on file  Social Connections: Not on file  Intimate Partner Violence: Not on file    Family History  Problem Relation Age of Onset  . Hypertension Mother   . Diabetes Mother   . Alcohol abuse Father     Review of Systems:  As stated in the HPI and otherwise negative.   BP 120/66   Pulse 79   Ht 5\' 8"  (1.727 m)   Wt 208 lb (94.3 kg)   SpO2 99%   BMI 31.63 kg/m   Physical Examination:  General: Well developed, well nourished, NAD  HEENT: OP clear, mucus membranes moist  SKIN: warm, dry. No rashes. Neuro: No focal deficits  Musculoskeletal: Muscle strength 5/5 all ext  Psychiatric: Mood and affect normal  Neck: No JVD, no carotid bruits, no thyromegaly, no lymphadenopathy.  Lungs:Clear bilaterally, no wheezes, rhonci, crackles Cardiovascular: Regular rate and rhythm. No murmurs, gallops or  rubs. Abdomen:Soft. Bowel sounds present. Non-tender.  Extremities: No lower  extremity edema. Pulses are 2 + in the bilateral DP/PT.  Echo 04/29/20: 1. Poor acoustic windows. Cannot fully evaluate regional wall motion as  endocardium is difficult to see. Overall no significant difference from  echo done in 04/11/19. Left ventricular ejection fraction, by estimation,  is 50 to 55%. The left ventricle has  low normal function. The left ventricular internal cavity size was  moderately to severely dilated. There is mild left ventricular  hypertrophy. Left ventricular diastolic parameters were normal.  2. Right ventricular systolic function is normal. The right ventricular  size is normal. There is normal pulmonary artery systolic pressure.  3. The mitral valve is grossly normal. Mild mitral valve regurgitation.  4. The aortic valve was not well visualized. Aortic valve regurgitation  is trivial. No aortic stenosis is present.  5. The inferior vena cava is normal in size with greater than 50%  respiratory variability, suggesting right atrial pressure of 3 mmHg.   EKG:  EKG is ordered today. The ekg ordered today demonstrates Sinus, LAD, IVCD-unchanged  Recent Labs: 02/19/2020: B Natriuretic Peptide 111.8 03/10/2020: Hemoglobin 10.7; Platelets 340 04/09/2020: ALT 16; BUN 11; Creatinine, Ser 0.80; Potassium 3.8; Sodium 142   Lipid Panel    Component Value Date/Time   CHOL 133 04/09/2020 0814   TRIG 79 04/09/2020 0814   HDL 49 04/09/2020 0814   CHOLHDL 2.7 04/09/2020 0814   CHOLHDL 6.1 06/29/2018 0420   VLDL 48 (H) 06/29/2018 0420   LDLCALC 68 04/09/2020 0814     Wt Readings from Last 3 Encounters:  05/03/20 208 lb (94.3 kg)  04/14/20 215 lb 4.8 oz (97.7 kg)  03/18/20 202 lb 0.1 oz (91.6 kg)     Other studies Reviewed: Additional studies/ records that were reviewed today include: . Review of the above records demonstrates:   Assessment and Plan:   1. CAD without angina:  I do not think the recent chest pain was cardiac. EKG is unchanged. No exertional chest pain. He had PCI/stenting of the mid LAD and SVG to PDA in an outside hospital October 2020. He is not on an ASA since he is on Eliquis.   He is statin intolerant and is now on Praluent and Bempedoic acid. Will add Imdur 30 mg daily. NTG prn.   2. Pericardial effusion: Resolved s/p pericardial window. No effusion on echo February 2022.   3. Aortic stenosis: s/p surgical AVR with bioprosthetic AVR. His bioprosthetic valve is working well by echo in  February 2022.   4. Atrial fib, paroxysmal: Sinus today. He had a stroke in April 2020 and another stroke remotely in 2007. Continue Eliquis.    5. HTN: BP is controlled. No changes today  6. Hyperlipidemia: He is statin intolerant. He is no longer on Zetia. Continue Praluent and Bempedoic acid.     7. Chronic diastolic CHF: No volume overload on exam. Weight is stable. Continue Lasix.    Current medicines are reviewed at length with the patient today.  The patient does not have concerns regarding medicines.  The following changes have been made:  no change  Labs/ tests ordered today include:   Orders Placed This Encounter  Procedures  . EKG 12-Lead   Disposition:   FU with me in 12 months   Signed, Lauree Chandler, MD 05/03/2020 4:06 PM    [facility] [place], [place], [place]  [address] Phone: [phone]; Fax: [phone]

## 2020-05-04 DIAGNOSIS — E782 Mixed hyperlipidemia: Secondary | ICD-10-CM | POA: Diagnosis not present

## 2020-05-04 DIAGNOSIS — E119 Type 2 diabetes mellitus without complications: Secondary | ICD-10-CM | POA: Diagnosis not present

## 2020-05-04 DIAGNOSIS — I10 Essential (primary) hypertension: Secondary | ICD-10-CM | POA: Diagnosis not present

## 2020-05-04 DIAGNOSIS — E039 Hypothyroidism, unspecified: Secondary | ICD-10-CM | POA: Diagnosis not present

## 2020-05-07 DIAGNOSIS — I5032 Chronic diastolic (congestive) heart failure: Secondary | ICD-10-CM | POA: Diagnosis not present

## 2020-05-07 DIAGNOSIS — I110 Hypertensive heart disease with heart failure: Secondary | ICD-10-CM | POA: Diagnosis not present

## 2020-05-07 DIAGNOSIS — I2510 Atherosclerotic heart disease of native coronary artery without angina pectoris: Secondary | ICD-10-CM | POA: Diagnosis not present

## 2020-05-07 DIAGNOSIS — E1142 Type 2 diabetes mellitus with diabetic polyneuropathy: Secondary | ICD-10-CM | POA: Diagnosis not present

## 2020-05-07 DIAGNOSIS — M150 Primary generalized (osteo)arthritis: Secondary | ICD-10-CM | POA: Diagnosis not present

## 2020-05-07 DIAGNOSIS — F419 Anxiety disorder, unspecified: Secondary | ICD-10-CM | POA: Diagnosis not present

## 2020-05-09 DIAGNOSIS — F331 Major depressive disorder, recurrent, moderate: Secondary | ICD-10-CM | POA: Diagnosis not present

## 2020-05-12 DIAGNOSIS — I110 Hypertensive heart disease with heart failure: Secondary | ICD-10-CM | POA: Diagnosis not present

## 2020-05-12 DIAGNOSIS — I2510 Atherosclerotic heart disease of native coronary artery without angina pectoris: Secondary | ICD-10-CM | POA: Diagnosis not present

## 2020-05-12 DIAGNOSIS — E1142 Type 2 diabetes mellitus with diabetic polyneuropathy: Secondary | ICD-10-CM | POA: Diagnosis not present

## 2020-05-12 DIAGNOSIS — M150 Primary generalized (osteo)arthritis: Secondary | ICD-10-CM | POA: Diagnosis not present

## 2020-05-12 DIAGNOSIS — F419 Anxiety disorder, unspecified: Secondary | ICD-10-CM | POA: Diagnosis not present

## 2020-05-12 DIAGNOSIS — I5032 Chronic diastolic (congestive) heart failure: Secondary | ICD-10-CM | POA: Diagnosis not present

## 2020-05-17 DIAGNOSIS — I2510 Atherosclerotic heart disease of native coronary artery without angina pectoris: Secondary | ICD-10-CM | POA: Diagnosis not present

## 2020-05-17 DIAGNOSIS — F419 Anxiety disorder, unspecified: Secondary | ICD-10-CM | POA: Diagnosis not present

## 2020-05-17 DIAGNOSIS — I10 Essential (primary) hypertension: Secondary | ICD-10-CM | POA: Diagnosis not present

## 2020-05-17 DIAGNOSIS — E782 Mixed hyperlipidemia: Secondary | ICD-10-CM | POA: Diagnosis not present

## 2020-05-17 DIAGNOSIS — E119 Type 2 diabetes mellitus without complications: Secondary | ICD-10-CM | POA: Diagnosis not present

## 2020-05-17 DIAGNOSIS — I110 Hypertensive heart disease with heart failure: Secondary | ICD-10-CM | POA: Diagnosis not present

## 2020-05-17 DIAGNOSIS — E1142 Type 2 diabetes mellitus with diabetic polyneuropathy: Secondary | ICD-10-CM | POA: Diagnosis not present

## 2020-05-17 DIAGNOSIS — M150 Primary generalized (osteo)arthritis: Secondary | ICD-10-CM | POA: Diagnosis not present

## 2020-05-17 DIAGNOSIS — I5032 Chronic diastolic (congestive) heart failure: Secondary | ICD-10-CM | POA: Diagnosis not present

## 2020-05-18 DIAGNOSIS — F331 Major depressive disorder, recurrent, moderate: Secondary | ICD-10-CM | POA: Diagnosis not present

## 2020-05-20 DIAGNOSIS — F419 Anxiety disorder, unspecified: Secondary | ICD-10-CM | POA: Diagnosis not present

## 2020-05-20 DIAGNOSIS — I4892 Unspecified atrial flutter: Secondary | ICD-10-CM | POA: Diagnosis not present

## 2020-05-20 DIAGNOSIS — I110 Hypertensive heart disease with heart failure: Secondary | ICD-10-CM | POA: Diagnosis not present

## 2020-05-20 DIAGNOSIS — Z8616 Personal history of COVID-19: Secondary | ICD-10-CM | POA: Diagnosis not present

## 2020-05-20 DIAGNOSIS — Z7901 Long term (current) use of anticoagulants: Secondary | ICD-10-CM | POA: Diagnosis not present

## 2020-05-20 DIAGNOSIS — E1142 Type 2 diabetes mellitus with diabetic polyneuropathy: Secondary | ICD-10-CM | POA: Diagnosis not present

## 2020-05-20 DIAGNOSIS — I480 Paroxysmal atrial fibrillation: Secondary | ICD-10-CM | POA: Diagnosis not present

## 2020-05-20 DIAGNOSIS — F32A Depression, unspecified: Secondary | ICD-10-CM | POA: Diagnosis not present

## 2020-05-20 DIAGNOSIS — M150 Primary generalized (osteo)arthritis: Secondary | ICD-10-CM | POA: Diagnosis not present

## 2020-05-20 DIAGNOSIS — Z7984 Long term (current) use of oral hypoglycemic drugs: Secondary | ICD-10-CM | POA: Diagnosis not present

## 2020-05-20 DIAGNOSIS — I2510 Atherosclerotic heart disease of native coronary artery without angina pectoris: Secondary | ICD-10-CM | POA: Diagnosis not present

## 2020-05-20 DIAGNOSIS — Z951 Presence of aortocoronary bypass graft: Secondary | ICD-10-CM | POA: Diagnosis not present

## 2020-05-20 DIAGNOSIS — Z794 Long term (current) use of insulin: Secondary | ICD-10-CM | POA: Diagnosis not present

## 2020-05-20 DIAGNOSIS — I5032 Chronic diastolic (congestive) heart failure: Secondary | ICD-10-CM | POA: Diagnosis not present

## 2020-05-20 DIAGNOSIS — Z79899 Other long term (current) drug therapy: Secondary | ICD-10-CM | POA: Diagnosis not present

## 2020-05-20 DIAGNOSIS — K7460 Unspecified cirrhosis of liver: Secondary | ICD-10-CM | POA: Diagnosis not present

## 2020-05-21 DIAGNOSIS — F331 Major depressive disorder, recurrent, moderate: Secondary | ICD-10-CM | POA: Diagnosis not present

## 2020-05-21 DIAGNOSIS — F064 Anxiety disorder due to known physiological condition: Secondary | ICD-10-CM | POA: Diagnosis not present

## 2020-05-24 DIAGNOSIS — I2510 Atherosclerotic heart disease of native coronary artery without angina pectoris: Secondary | ICD-10-CM | POA: Diagnosis not present

## 2020-05-24 DIAGNOSIS — I5032 Chronic diastolic (congestive) heart failure: Secondary | ICD-10-CM | POA: Diagnosis not present

## 2020-05-24 DIAGNOSIS — F419 Anxiety disorder, unspecified: Secondary | ICD-10-CM | POA: Diagnosis not present

## 2020-05-24 DIAGNOSIS — I110 Hypertensive heart disease with heart failure: Secondary | ICD-10-CM | POA: Diagnosis not present

## 2020-05-24 DIAGNOSIS — E1142 Type 2 diabetes mellitus with diabetic polyneuropathy: Secondary | ICD-10-CM | POA: Diagnosis not present

## 2020-05-24 DIAGNOSIS — M150 Primary generalized (osteo)arthritis: Secondary | ICD-10-CM | POA: Diagnosis not present

## 2020-05-26 DIAGNOSIS — I5032 Chronic diastolic (congestive) heart failure: Secondary | ICD-10-CM | POA: Diagnosis not present

## 2020-05-26 DIAGNOSIS — M150 Primary generalized (osteo)arthritis: Secondary | ICD-10-CM | POA: Diagnosis not present

## 2020-05-26 DIAGNOSIS — I2510 Atherosclerotic heart disease of native coronary artery without angina pectoris: Secondary | ICD-10-CM | POA: Diagnosis not present

## 2020-05-26 DIAGNOSIS — E1142 Type 2 diabetes mellitus with diabetic polyneuropathy: Secondary | ICD-10-CM | POA: Diagnosis not present

## 2020-05-26 DIAGNOSIS — F419 Anxiety disorder, unspecified: Secondary | ICD-10-CM | POA: Diagnosis not present

## 2020-05-26 DIAGNOSIS — I110 Hypertensive heart disease with heart failure: Secondary | ICD-10-CM | POA: Diagnosis not present

## 2020-05-29 DIAGNOSIS — F331 Major depressive disorder, recurrent, moderate: Secondary | ICD-10-CM | POA: Diagnosis not present

## 2020-05-31 DIAGNOSIS — I2510 Atherosclerotic heart disease of native coronary artery without angina pectoris: Secondary | ICD-10-CM | POA: Diagnosis not present

## 2020-05-31 DIAGNOSIS — F419 Anxiety disorder, unspecified: Secondary | ICD-10-CM | POA: Diagnosis not present

## 2020-05-31 DIAGNOSIS — M150 Primary generalized (osteo)arthritis: Secondary | ICD-10-CM | POA: Diagnosis not present

## 2020-05-31 DIAGNOSIS — E1142 Type 2 diabetes mellitus with diabetic polyneuropathy: Secondary | ICD-10-CM | POA: Diagnosis not present

## 2020-05-31 DIAGNOSIS — I5032 Chronic diastolic (congestive) heart failure: Secondary | ICD-10-CM | POA: Diagnosis not present

## 2020-05-31 DIAGNOSIS — I110 Hypertensive heart disease with heart failure: Secondary | ICD-10-CM | POA: Diagnosis not present

## 2020-06-03 DIAGNOSIS — M150 Primary generalized (osteo)arthritis: Secondary | ICD-10-CM | POA: Diagnosis not present

## 2020-06-03 DIAGNOSIS — I2510 Atherosclerotic heart disease of native coronary artery without angina pectoris: Secondary | ICD-10-CM | POA: Diagnosis not present

## 2020-06-03 DIAGNOSIS — I110 Hypertensive heart disease with heart failure: Secondary | ICD-10-CM | POA: Diagnosis not present

## 2020-06-09 ENCOUNTER — Encounter: Payer: Self-pay | Admitting: Student

## 2020-06-09 ENCOUNTER — Other Ambulatory Visit: Payer: Self-pay

## 2020-06-09 ENCOUNTER — Other Ambulatory Visit: Payer: Self-pay | Admitting: Internal Medicine

## 2020-06-09 ENCOUNTER — Ambulatory Visit (INDEPENDENT_AMBULATORY_CARE_PROVIDER_SITE_OTHER): Payer: Medicare Other | Admitting: Student

## 2020-06-09 ENCOUNTER — Other Ambulatory Visit (HOSPITAL_COMMUNITY): Payer: Self-pay | Admitting: Student

## 2020-06-09 VITALS — BP 135/90 | HR 73 | Temp 97.6°F | Ht 68.0 in | Wt 217.8 lb

## 2020-06-09 DIAGNOSIS — I2510 Atherosclerotic heart disease of native coronary artery without angina pectoris: Secondary | ICD-10-CM | POA: Diagnosis not present

## 2020-06-09 DIAGNOSIS — H547 Unspecified visual loss: Secondary | ICD-10-CM | POA: Diagnosis not present

## 2020-06-09 DIAGNOSIS — M25512 Pain in left shoulder: Secondary | ICD-10-CM

## 2020-06-09 DIAGNOSIS — E1140 Type 2 diabetes mellitus with diabetic neuropathy, unspecified: Secondary | ICD-10-CM | POA: Diagnosis not present

## 2020-06-09 DIAGNOSIS — G8929 Other chronic pain: Secondary | ICD-10-CM | POA: Diagnosis not present

## 2020-06-09 DIAGNOSIS — Z794 Long term (current) use of insulin: Secondary | ICD-10-CM | POA: Diagnosis not present

## 2020-06-09 DIAGNOSIS — F321 Major depressive disorder, single episode, moderate: Secondary | ICD-10-CM | POA: Diagnosis not present

## 2020-06-09 DIAGNOSIS — E1142 Type 2 diabetes mellitus with diabetic polyneuropathy: Secondary | ICD-10-CM

## 2020-06-09 DIAGNOSIS — I119 Hypertensive heart disease without heart failure: Secondary | ICD-10-CM | POA: Diagnosis not present

## 2020-06-09 LAB — POCT GLYCOSYLATED HEMOGLOBIN (HGB A1C): Hemoglobin A1C: 8.3 % — AB (ref 4.0–5.6)

## 2020-06-09 LAB — GLUCOSE, CAPILLARY: Glucose-Capillary: 233 mg/dL — ABNORMAL HIGH (ref 70–99)

## 2020-06-09 MED ORDER — INSULIN ASPART 100 UNIT/ML FLEXPEN
18.0000 [IU] | PEN_INJECTOR | Freq: Three times a day (TID) | SUBCUTANEOUS | 0 refills | Status: DC
Start: 2020-06-09 — End: 2020-06-21

## 2020-06-09 NOTE — Patient Instructions (Addendum)
Mark Cabrera,  It was a pleasure seeing you today!  Today we discussed your diabetes, antidepressant, and shoulder pain. I am recommending you increase Soliqua to 50u daily (long acting insulin).   We will STOP the escitalopram (antidepressant).   Please give Dr. Erlinda Hong a call regarding your shoulder [phone]).  We look forward to seeing you next time. Please call our clinic at [phone] if you have any questions or concerns. The best time to call is Monday-Friday from 9am-4pm, but there is someone available 24/7 at the same number. If you need medication refills, please notify your pharmacy one week in advance and they will send [place] a request.  Thank you for letting us take part in your care. Wishing you the best!  Thank you, Dr. [name], MD

## 2020-06-09 NOTE — Progress Notes (Signed)
   CC: diabetes, shoulder pain  HPI:  [name] is a 70 y.o. with medical history as listed below presenting to [facility] for diabetes follow-up and evaluation of left shoulder pain.  Please see problem-based list for further details, assessments, and plans.  Past Medical History:  Diagnosis Date  . Acute diastolic congestive heart failure ([place])   . Aortic valve stenosis s/p AVR 2018   Echo 2/22: Poor acoustic windows, EF 50-55, mild LVH, normal RVSF, mild MR, trivial AI, no AS, no pericardial effusion  . Atrial fibrillation ([place]) - post-op CABG    04/2016 CHA2DS2VAS score = 5  . Atypical nevi   . Coronary artery disease s/p 2 vessel CABG   . Depression    "years ago"  . Diabetes mellitus   . Dyspnea    in the past   . GERD (gastroesophageal reflux disease)   . Heart murmur   . Hepatic cirrhosis ([place]) 06/29/2017  . History of kidney stones   . Hyperlipidemia    hx of transaminitis secondary to statin and he has decided not to use statins secondary to potential side effects.  . Hypertension   . Nephrolithiasis   . Osteoarthritis, knee   . Sleep apnea     Central apnea. Not using cpap  . Stroke [facility]) 2007  . Transaminitis     Statin-induced  . 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 2-D echo was normal no embolic source found.   Review of Systems:  As per HPI  Physical Exam:  Vitals:   06/09/20 0849  BP: 135/90  Pulse: 73  Temp: 97.6 F (36.4 C)  SpO2: 99%  Weight: 217 lb 12.8 oz (98.8 kg)  Height: 5\' 8"  (1.727 m)   General: Sitting comfortably in chair, no acute distress CV: Regular rate, rhythm. No m/r/g appreciated. Pulm: Normal work of breathing. Clear to auscultation  bilaterally. MSK: L shoulder without effusion or erythema. No tenderness on palpation on L shoulder. ROM severely limited with passive motion of flexion, extension, internal/external rotation.   Assessment & Plan:   See Encounters Tab for problem based charting.  Patient discussed with Dr. Heber [place]

## 2020-06-11 NOTE — Assessment & Plan Note (Signed)
Patient reports that he was unaware he was taking an antidepressant. He recently found out he was taking one while getting his medications at the assisted living facility. States he does not feel depressed and does not feel like he needs this. Denies SI/HI. Per patient request, will stop escitalopram. Will need to continue to monitor at future appointments. - Stop escitalopram

## 2020-06-11 NOTE — Assessment & Plan Note (Signed)
Due to patient's poor eyesight, patient recently started living in assisted living facility.

## 2020-06-11 NOTE — Assessment & Plan Note (Signed)
BP Readings from Last 3 Encounters:  06/09/20 135/90  05/03/20 120/66  04/14/20 (!) 140/93   BP well controlled on current regimen. Patient lives in assisted living facility but has intermittently refused his medications as he did not know what they were for. States he has had one episode of chest pain recently that eventually resolved. Otherwise denies dyspnea, blurry vision. - Continue lasix 40, Imdur 30 daily

## 2020-06-11 NOTE — Assessment & Plan Note (Signed)
A1c 8.3 today from 9.4 in December. Patient reports compliance with his medications while at the assisted living facility. Denies episodes of hypoglycemia, malaise, nausea, vomiting. Reports his sugars are usually in the 200's, does not recall any readings below 100. Plan to increase Soliqua to 50U daily for better basal control.  - Increase Soliqua 50U daily - Continue metformin 500mg  BID, Humalog 18u TID

## 2020-06-11 NOTE — Assessment & Plan Note (Signed)
Patient mentions chronic left shoulder pain. Reports worsening functional status, as he is barely able to lift his arm due to the pain. Mark Cabrera previously had rotator cuff surgery on his right shoulder and believes his left shoulder also has a rotator cuff repair. He previously was seen by Dr. Erlinda Hong and given steroid injection. XR in 2020 revealed mild degenerative changes in left shoulder and calcific tendinitis of rotator cuff.   On exam, no signs of septic joint or bony tenderness, ROM severely limited. Plan to have patient return to Dr. Erlinda Hong for further evaluation. - Return to Dr. Erlinda Hong for evaluation

## 2020-06-12 NOTE — Progress Notes (Signed)
Internal Medicine Clinic Attending ? ?Case discussed with Dr. Braswell  At the time of the visit.  We reviewed the resident?s history and exam and pertinent patient test results.  I agree with the assessment, diagnosis, and plan of care documented in the resident?s note.  ?

## 2020-06-17 DIAGNOSIS — F331 Major depressive disorder, recurrent, moderate: Secondary | ICD-10-CM | POA: Diagnosis not present

## 2020-06-18 DIAGNOSIS — I10 Essential (primary) hypertension: Secondary | ICD-10-CM | POA: Diagnosis not present

## 2020-06-18 DIAGNOSIS — E782 Mixed hyperlipidemia: Secondary | ICD-10-CM | POA: Diagnosis not present

## 2020-06-18 DIAGNOSIS — E119 Type 2 diabetes mellitus without complications: Secondary | ICD-10-CM | POA: Diagnosis not present

## 2020-06-21 ENCOUNTER — Other Ambulatory Visit: Payer: Self-pay

## 2020-06-21 ENCOUNTER — Other Ambulatory Visit (HOSPITAL_COMMUNITY): Payer: Self-pay

## 2020-06-21 DIAGNOSIS — I5032 Chronic diastolic (congestive) heart failure: Secondary | ICD-10-CM

## 2020-06-21 DIAGNOSIS — E1142 Type 2 diabetes mellitus with diabetic polyneuropathy: Secondary | ICD-10-CM

## 2020-06-21 DIAGNOSIS — E1140 Type 2 diabetes mellitus with diabetic neuropathy, unspecified: Secondary | ICD-10-CM

## 2020-06-21 MED ORDER — METFORMIN HCL 500 MG PO TABS: 500.0000 mg | ORAL_TABLET | Freq: Two times a day (BID) | ORAL | 3 refills | Status: DC

## 2020-06-21 MED ORDER — INSULIN ASPART 100 UNIT/ML FLEXPEN: 18.0000 [IU] | PEN_INJECTOR | Freq: Three times a day (TID) | SUBCUTANEOUS | 3 refills | Status: DC

## 2020-06-21 MED ORDER — LISINOPRIL 10 MG PO TABS: 10.0000 mg | ORAL_TABLET | Freq: Every day | ORAL | 3 refills | Status: DC

## 2020-06-21 MED ORDER — ISOSORBIDE MONONITRATE ER 30 MG PO TB24: 30.0000 mg | ORAL_TABLET | Freq: Every day | ORAL | 3 refills | Status: DC

## 2020-06-21 MED ORDER — NITROGLYCERIN 0.4 MG SL SUBL: 0.4000 mg | SUBLINGUAL_TABLET | SUBLINGUAL | 2 refills | Status: DC | PRN

## 2020-06-21 MED ORDER — SOLIQUA 100-33 UNT-MCG/ML ~~LOC~~ SOPN: 50.0000 [IU] | PEN_INJECTOR | Freq: Every day | SUBCUTANEOUS | 3 refills | Status: DC

## 2020-06-21 MED ORDER — APIXABAN 5 MG PO TABS: ORAL_TABLET | Freq: Two times a day (BID) | ORAL | 5 refills | Status: DC

## 2020-06-21 MED ORDER — REPATHA SURECLICK 140 MG/ML ~~LOC~~ SOAJ: SUBCUTANEOUS | 11 refills | Status: DC

## 2020-06-21 MED ORDER — FUROSEMIDE 40 MG PO TABS: 40.0000 mg | ORAL_TABLET | Freq: Every day | ORAL | 11 refills | Status: DC

## 2020-06-21 NOTE — Telephone Encounter (Signed)
Escitalopram was discontinued Repatha and apixaban were sent to [facility] today by an outside provider   Request for Clopidogrel received, not on med list, pt has a hx of taking medication.  Will forward to MD to review.  Furosimide was in print mode and pharmacy never received last RX.  RN called [facility], no RX's were picked up in March/April. Please resend to [facility]. Thank you SChaplin, RN,BSN

## 2020-06-21 NOTE — Telephone Encounter (Signed)
Age 70, weight 99kg, SCr 0.8 on 04/09/20 afib indication, last visit 05/03/20, refill sent in

## 2020-06-21 NOTE — Telephone Encounter (Signed)
Pharmacy tech is requesting metFORMIN (GLUCOPHAGE) 500 MG tablet and escitalopram (LEXAPRO) 10 MG tablet sent REPATHA SURECLICK 727 MG/ML SOAJ and SOLIQUA 100-33 UNT-MCG/ML SOPN and insulin aspart (NOVOLOG) 100 UNIT/ML FlexPen and apixaban (ELIQUIS) 5 MG TABS tablet and furosemide (LASIX) 40 MG tablet  Sent to  [facility] (N), [place] Phone:  [phone]  Fax:  [phone]      ( she is also requesting clopidogrel )

## 2020-06-30 ENCOUNTER — Telehealth: Payer: Self-pay

## 2020-06-30 NOTE — Telephone Encounter (Signed)
Return pt's call; no answer - left message to call the office. 

## 2020-06-30 NOTE — Telephone Encounter (Signed)
Requesting to speak with a nurse about meds, please call back.

## 2020-07-05 ENCOUNTER — Ambulatory Visit (INDEPENDENT_AMBULATORY_CARE_PROVIDER_SITE_OTHER): Payer: Medicare Other | Admitting: Internal Medicine

## 2020-07-05 ENCOUNTER — Other Ambulatory Visit: Payer: Self-pay

## 2020-07-05 ENCOUNTER — Encounter: Payer: Self-pay | Admitting: Internal Medicine

## 2020-07-05 VITALS — BP 136/75 | HR 74 | Temp 97.9°F | Ht 68.0 in | Wt 215.3 lb

## 2020-07-05 DIAGNOSIS — R1900 Intra-abdominal and pelvic swelling, mass and lump, unspecified site: Secondary | ICD-10-CM | POA: Diagnosis not present

## 2020-07-05 DIAGNOSIS — G8929 Other chronic pain: Secondary | ICD-10-CM

## 2020-07-05 DIAGNOSIS — I2510 Atherosclerotic heart disease of native coronary artery without angina pectoris: Secondary | ICD-10-CM | POA: Diagnosis not present

## 2020-07-05 DIAGNOSIS — F321 Major depressive disorder, single episode, moderate: Secondary | ICD-10-CM | POA: Diagnosis not present

## 2020-07-05 DIAGNOSIS — E1140 Type 2 diabetes mellitus with diabetic neuropathy, unspecified: Secondary | ICD-10-CM | POA: Diagnosis not present

## 2020-07-05 DIAGNOSIS — M25512 Pain in left shoulder: Secondary | ICD-10-CM | POA: Diagnosis not present

## 2020-07-05 DIAGNOSIS — N451 Epididymitis: Secondary | ICD-10-CM | POA: Diagnosis not present

## 2020-07-05 DIAGNOSIS — Z794 Long term (current) use of insulin: Secondary | ICD-10-CM | POA: Diagnosis not present

## 2020-07-05 DIAGNOSIS — M25552 Pain in left hip: Secondary | ICD-10-CM

## 2020-07-05 MED ORDER — INSULIN PEN NEEDLE 31G X 5 MM MISC: 3 refills | Status: DC

## 2020-07-05 MED ORDER — SOLIQUA 100-33 UNT-MCG/ML ~~LOC~~ SOPN: 40.0000 [IU] | PEN_INJECTOR | Freq: Every day | SUBCUTANEOUS | 3 refills | Status: DC

## 2020-07-05 NOTE — Patient Instructions (Signed)
Thank you for allowing [place] to provide your care today. Today we discussed your hip pain    I have ordered no labs for you. I will call if any are abnormal.    Today we made the following changes to your medications.    Please start lidocaine patch for your paion  Please follow-up as needed.    Should you have any questions or concerns please call the internal medicine clinic at [phone].     Hip Pain The hip is the joint between the upper legs and the lower pelvis. The bones, cartilage, tendons, and muscles of your hip joint support your body and allow you to move around. Hip pain can range from a minor ache to severe pain in one or both of your hips. The pain may be felt on the inside of the hip joint near the groin, or on the outside near the buttocks and upper thigh. You may also have swelling or stiffness in your hip area. Follow these instructions at home: Managing pain, stiffness, and swelling  If directed, put ice on the painful area. To do this: ? Put ice in a plastic bag. ? Place a towel between your skin and the bag. ? Leave the ice on for 20 minutes, 2-3 times a day.  If directed, apply heat to the affected area as often as told by your health care provider. Use the heat source that your health care provider recommends, such as a moist heat pack or a heating pad. ? Place a towel between your skin and the heat source. ? Leave the heat on for 20-30 minutes. ? Remove the heat if your skin turns bright red. This is especially important if you are unable to feel pain, heat, or cold. You may have a greater risk of getting burned.      Activity  Do exercises as told by your health care provider.  Avoid activities that cause pain. General instructions  Take over-the-counter and prescription medicines only as told by your health care provider.  Keep a journal of your symptoms. Write down: ? How often you have hip pain. ? The location of your pain. ? What the pain feels  like. ? What makes the pain worse.  Sleep with a pillow between your legs on your most comfortable side.  Keep all follow-up visits as told by your health care provider. This is important.   Contact a health care provider if:  You cannot put weight on your leg.  Your pain or swelling continues or gets worse after one week.  It gets harder to walk.  You have a fever. Get help right away if:  You fall.  You have a sudden increase in pain and swelling in your hip.  Your hip is red or swollen or very tender to touch. Summary  Hip pain can range from a minor ache to severe pain in one or both of your hips.  The pain may be felt on the inside of the hip joint near the groin, or on the outside near the buttocks and upper thigh.  Avoid activities that cause pain.  Write down how often you have hip pain, the location of the pain, what makes it worse, and what it feels like. This information is not intended to replace advice given to you by your health care provider. Make sure you discuss any questions you have with your health care provider. Document Revised: 07/15/2018 Document Reviewed: 07/15/2018 Elsevier Patient Education  2021 Elsevier  Inc.

## 2020-07-05 NOTE — Assessment & Plan Note (Signed)
PHQ9 SCORE ONLY 07/05/2020 03/10/2020 12/03/2019  PHQ-9 Total Score 5 21 1    Presents for f/u management of depression. Previously prescribed escitalopram. Mentions he had refused [facility]opram at the [facility] but has moved out and currently living in community. Reviewing medications in the bag shows pillpacks with escitalopram doses gone suggesting he had resumed it w/o realizing. Current PHQ-9 score significantly improved at score of 5 from 21 in 02/2020  A/P Mood appeared to have improved. Possibly due to Lexapro vs living condition change. Discussed risk and benefit of continuing anti-depressant therapy but pt refused. 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

## 2020-07-05 NOTE — Assessment & Plan Note (Signed)
Presents w/ left testicular pain. Mentions pain has been presistent since February. Work-up for infectious etiology on prior visit negative. Possibly due to referred pain from hip pathology. Denies any penile discharge, or B-sxs.  - Monitor

## 2020-07-05 NOTE — Assessment & Plan Note (Signed)
Lab Results  Component Value Date   HGBA1C 8.3 (A) 06/09/2020   Presents for f/u management of diabetes. Currently on Soliqua 40 units daily, metformin 500mg  BID, humalog 18 units TID. Mentions having hypoglycemic episodes when insulin were administered by ALF staff. Has recently moved to a friend's home and is taking insulin as prescribed. Mentions seeing blood glucose levels around 150s at home.  A/P Present with uncontrolled diabetes. Inconsistent med-adherence due to episode of hypoglycemia. Tx complicated by poor health literacy. Previously instructed to increase to 50 Units but has been taking 40 units. With recent changes to home and possibly better med-adherence, will monitor at current dosage. - C/w insulin glargine-lixisenatide 40 units daily - C/w Metformin 500mg  BID, insulin aspart 18 units TID qc

## 2020-07-05 NOTE — Assessment & Plan Note (Signed)
Complaining of abdominal pain on LUQ. Denies abdominal pain, nausea, vomiting, diarrhea, constipation. Previously worked up for abdominal wall hernia with negative ultrasound.   A/P Presents w/ abdominal pain. No B-sxs. No GI complaints (N,V,D). Tenderness on deep palpation. Possibly MSk - Monitor

## 2020-07-05 NOTE — Progress Notes (Signed)
CC: Hip pain  HPI: Mark Cabrera is a 70 y.o. with PMH listed below presenting with complaint of hip pain. Please see problem based assessment and plan for further details.  Past Medical History:  Diagnosis Date  . Acute diastolic congestive heart failure ([place])   . Aortic valve stenosis s/p AVR 2018   Echo 2/22: Poor acoustic windows, EF 50-55, mild LVH, normal RVSF, mild MR, trivial AI, no AS, no pericardial effusion  . Atrial fibrillation ([place]) - post-op CABG    04/2016 CHA2DS2VAS score = 5  . Atypical nevi   . Coronary artery disease s/p 2 vessel CABG   . Depression    "years ago"  . Diabetes mellitus   . Dyspnea    in the past   . GERD (gastroesophageal reflux disease)   . Heart murmur   . Hepatic cirrhosis ([place]) 06/29/2017  . History of kidney stones   . Hyperlipidemia    hx of transaminitis secondary to statin and he has decided not to use statins secondary to potential side effects.  . Hypertension   . Nephrolithiasis   . Osteoarthritis, knee   . Sleep apnea     Central apnea. Not using cpap  . Stroke [facility]) 2007  . Transaminitis     Statin-induced  . 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 2-D echo was normal no embolic source found.   Review of Systems: Review of Systems  Constitutional: Negative for chills, fever and malaise/fatigue.  Eyes: Negative for blurred vision.  Respiratory: Negative for shortness of breath.   Cardiovascular: Negative for chest pain, palpitations and leg swelling.  Gastrointestinal: Negative for constipation, diarrhea, nausea and vomiting.  Genitourinary: Negative for dysuria, frequency and urgency.  Musculoskeletal: Positive for joint pain. Negative for  back pain and falls.  Neurological: Positive for tingling.     Physical Exam: Vitals:   07/05/20 1447  BP: 136/75  Pulse: 74  Temp: 97.9 F (36.6 C)  TempSrc: Oral  SpO2: 100%  Weight: 215 lb 4.8 oz (97.7 kg)  Height: 5\' 8"  (1.727 m)   Gen: Well-developed, well nourished, NAD HEENT: NCAT head, hearing intact CV: RRR, S1, S2 normal, 2/6 systolic murmur Pulm: CTAB, No rales, no wheezes Abd: BS+, NTND, No obvious hernia GU: Normal penis, normal testis, no obvious erythema, edema, discoloration, mild tenderness on palpation on anterior aspect of L testes Extm: + FABER, , antalgic gait, passive rom intact, active flexion, extension of L hip limited by pain, no lateral tenderness, no obvious palpable effusion, edema, no peripheral edema,  Skin: Dry, Warm, normal turgor, no wounds, no rashes, no lesions, well-healed sternotomy scars  Assessment & Plan:   Diabetes mellitus with neurological manifestation (HCC) Lab Results  Component Value Date   HGBA1C 8.3 (A) 06/09/2020   Presents for f/u management of diabetes. Currently on Soliqua 40 units daily, metformin 500mg  BID, humalog 18 units TID. Mentions having hypoglycemic episodes when insulin were administered by ALF staff. Has recently moved to a friend's home and is taking insulin as prescribed. Mentions seeing blood glucose levels around 150s at home.  A/P Present with uncontrolled diabetes. Inconsistent med-adherence due to episode of hypoglycemia. Tx complicated by poor health literacy. Previously instructed to increase to 50  Units but has been taking 40 units. With recent changes to home and possibly Cabrera med-adherence, will monitor at current dosage. - C/w insulin glargine-lixisenatide 40 units daily - C/w Metformin 500mg  BID, insulin aspart 18 units TID qc  Epididymitis Presents w/ left testicular pain. Mentions pain has been presistent since February. Work-up for infectious etiology on prior visit negative. Possibly due to  referred pain from hip pathology. Denies any penile discharge, or B-sxs.  - Monitor  Chronic left shoulder pain Hx of rotator cuff surgery on his shoulder. Previously seen by Dr.Xu and given steroid injections. Prior XR in 2020 showign mild degenerative changes and calcific tendinitis of rotator cuff. Continues to endorse pain with worsening exacerbation with exertion. Requesting referral to Dr.Xu again to re-establish care.  - Ortho referral  Depression PHQ9 SCORE ONLY 07/05/2020 03/10/2020 12/03/2019  PHQ-9 Total Score 5 21 1    Presents for f/u management of depression. Previously prescribed escitalopram. Mentions he had [facility] at the [facility] but has moved out and currently living in community. Reviewing medications in the bag shows pillpacks with escitalopram doses gone suggesting he had resumed it w/o realizing. Current PHQ-9 score significantly improved at score of 5 from 21 in 02/2020  A/P Mood appeared to have improved. Possibly due to Lexapro vs living condition change. Discussed risk and benefit of continuing anti-depressant therapy but pt refused. 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 Denies abdominal pain, nausea, vomiting, diarrhea, constipation. Previously worked up for abdominal wall hernia with negative ultrasound.   A/P Presents w/ abdominal pain. No B-sxs. No GI complaints (N,V,D). Tenderness on deep palpation. Possibly MSk - Monitor  Hip pain, chronic, left Mark Cabrera is a 70 yo M w/ PMH of CVA, DM, Cirrhosis, HFpEF presenting to [facility] w/ complaint of hip pain. He mentions having hip pain for years requiring cane for ambulation. He mentions having progressively worsening pain over the last month. Exacerbated by weight bearing. Improved with tylenol. Mentions that pain is keeping him from being able to ambulate as easily as before, especially while performing IADLs. He  denies any fevers, chills, nausea, vomiting. Denies any hx of trauma or falls although mentions having difficulty with memory due to hx of stroke.  A/P Present with acute exacerbation of chronic hip pain. FABER + test suggesting hip joint pathology vs iliopsoas spasm. Will get X-ray to guide diagnosis. - X-ray L hip - C/w tylenol for pain control    Patient discussed with Dr. Dareen Piano  -Mark Cabrera, [facility] Pager: [phone]

## 2020-07-05 NOTE — Assessment & Plan Note (Addendum)
Mark Cabrera is a 70 yo M w/ PMH of CVA, DM, Cirrhosis, HFpEF presenting to [facility] w/ complaint of hip pain. He mentions having hip pain for years requiring cane for ambulation. He mentions having progressively worsening pain over the last month. Exacerbated by weight bearing. Improved with tylenol. Mentions that pain is keeping him from being able to ambulate as easily as before, especially while performing IADLs. He denies any fevers, chills, nausea, vomiting. Denies any hx of trauma or falls although mentions having difficulty with memory due to hx of stroke.  A/P Present with acute exacerbation of chronic hip pain. FABER + test suggesting hip joint pathology vs iliopsoas spasm. Will get X-ray to guide diagnosis. - X-ray L hip - C/w tylenol for pain control

## 2020-07-05 NOTE — Assessment & Plan Note (Addendum)
Hx of rotator cuff surgery on his shoulder. Previously seen by Dr.Xu and given steroid injections. Prior XR in 2020 showign mild degenerative changes and calcific tendinitis of rotator cuff. Continues to endorse pain with worsening exacerbation with exertion. Requesting referral to Dr.Xu again to re-establish care.  - Ortho referral

## 2020-07-07 NOTE — Progress Notes (Signed)
Internal Medicine Clinic Attending  Case discussed with Dr. Lee  At the time of the visit.  We reviewed the resident's history and exam and pertinent patient test results.  I agree with the assessment, diagnosis, and plan of care documented in the resident's note.    

## 2020-07-14 ENCOUNTER — Ambulatory Visit: Payer: Medicare Other | Admitting: Orthopaedic Surgery

## 2020-07-14 DIAGNOSIS — H338 Other retinal detachments: Secondary | ICD-10-CM | POA: Diagnosis not present

## 2020-07-14 DIAGNOSIS — Z961 Presence of intraocular lens: Secondary | ICD-10-CM | POA: Diagnosis not present

## 2020-07-14 DIAGNOSIS — H35372 Puckering of macula, left eye: Secondary | ICD-10-CM | POA: Diagnosis not present

## 2020-07-23 ENCOUNTER — Ambulatory Visit (HOSPITAL_COMMUNITY)
Admission: RE | Admit: 2020-07-23 | Discharge: 2020-07-23 | Disposition: A | Discharge status: Home / Self Care | Payer: Medicare Other | Source: Ambulatory Visit | Attending: Internal Medicine | Admitting: Internal Medicine

## 2020-07-23 ENCOUNTER — Other Ambulatory Visit: Payer: Self-pay

## 2020-07-23 DIAGNOSIS — M25552 Pain in left hip: Secondary | ICD-10-CM | POA: Insufficient documentation

## 2020-07-23 DIAGNOSIS — G8929 Other chronic pain: Secondary | ICD-10-CM | POA: Diagnosis not present

## 2020-07-27 ENCOUNTER — Ambulatory Visit: Payer: Medicare Other | Admitting: Podiatry

## 2020-08-03 ENCOUNTER — Ambulatory Visit: Payer: Medicare Other | Admitting: Orthopaedic Surgery

## 2020-08-03 NOTE — Addendum Note (Signed)
Addended by: Hulan Fray on: 08/03/2020 05:17 PM   Modules accepted: Orders

## 2020-08-04 ENCOUNTER — Ambulatory Visit: Payer: Medicare Other | Admitting: Orthopaedic Surgery

## 2020-08-24 ENCOUNTER — Encounter: Payer: Self-pay | Admitting: Student

## 2020-08-24 ENCOUNTER — Ambulatory Visit (INDEPENDENT_AMBULATORY_CARE_PROVIDER_SITE_OTHER): Payer: Medicare Other | Admitting: Student

## 2020-08-24 ENCOUNTER — Other Ambulatory Visit (HOSPITAL_COMMUNITY): Payer: Self-pay

## 2020-08-24 ENCOUNTER — Other Ambulatory Visit: Payer: Self-pay

## 2020-08-24 VITALS — BP 112/66 | HR 70 | Temp 97.6°F | Ht 68.0 in | Wt 207.1 lb

## 2020-08-24 DIAGNOSIS — G3184 Mild cognitive impairment, so stated: Secondary | ICD-10-CM | POA: Diagnosis not present

## 2020-08-24 DIAGNOSIS — E1142 Type 2 diabetes mellitus with diabetic polyneuropathy: Secondary | ICD-10-CM

## 2020-08-24 DIAGNOSIS — R4189 Other symptoms and signs involving cognitive functions and awareness: Secondary | ICD-10-CM

## 2020-08-24 DIAGNOSIS — E538 Deficiency of other specified B group vitamins: Secondary | ICD-10-CM | POA: Diagnosis not present

## 2020-08-24 DIAGNOSIS — K7460 Unspecified cirrhosis of liver: Secondary | ICD-10-CM

## 2020-08-24 DIAGNOSIS — E1140 Type 2 diabetes mellitus with diabetic neuropathy, unspecified: Secondary | ICD-10-CM

## 2020-08-24 DIAGNOSIS — Z794 Long term (current) use of insulin: Secondary | ICD-10-CM

## 2020-08-24 DIAGNOSIS — R5383 Other fatigue: Secondary | ICD-10-CM

## 2020-08-24 DIAGNOSIS — D649 Anemia, unspecified: Secondary | ICD-10-CM

## 2020-08-24 DIAGNOSIS — F321 Major depressive disorder, single episode, moderate: Secondary | ICD-10-CM

## 2020-08-24 LAB — POCT GLYCOSYLATED HEMOGLOBIN (HGB A1C): Hemoglobin A1C: 12.9 % — AB (ref 4.0–5.6)

## 2020-08-24 LAB — GLUCOSE, CAPILLARY: Glucose-Capillary: 296 mg/dL — ABNORMAL HIGH (ref 70–99)

## 2020-08-24 MED ORDER — METFORMIN HCL 500 MG PO TABS
1000.0000 mg | ORAL_TABLET | Freq: Two times a day (BID) | ORAL | 3 refills | Status: DC
  Filled 2020-08-24: qty 90, 23d supply, fill #0

## 2020-08-24 NOTE — Progress Notes (Signed)
   CC: routine follow-up  HPI:  Mark Cabrera is a 70 y.o. with medical history as listed below presenting to [facility] for routine follow-up.  Please see problem-based list for further details, assessments, and plans.  Past Medical History:  Diagnosis Date   Acute diastolic congestive heart failure ([place])    Aortic valve stenosis s/p AVR 2018   Echo 2/22: Poor acoustic windows, EF 50-55, mild LVH, normal RVSF, mild MR, trivial AI, no AS, no pericardial effusion   Atrial fibrillation (HCC) - post-op CABG    04/2016 CHA2DS2VAS score = 5   Atypical nevi    Coronary artery disease s/p 2 vessel CABG    Depression    "years ago"   Diabetes mellitus    Dyspnea    in the past    GERD (gastroesophageal reflux disease)    Heart murmur    Hepatic cirrhosis ([place]) 06/29/2017   History of kidney stones    Hyperlipidemia    hx of transaminitis secondary to statin and he has decided not to use statins secondary to potential side effects.   Hypertension    Nephrolithiasis    Osteoarthritis, knee    Sleep apnea     Central apnea. 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 2-D echo was normal no embolic source found.   Review of Systems:  As per HPI  Physical Exam:  Vitals:   08/24/20 0912  BP: 112/66  Pulse: 70  Temp: 97.6 F (36.4 C)  TempSrc: Oral  SpO2: 100%  Weight: 207 lb 1.6 oz (93.9 kg)  Height: 5\' 8"  (1.727 m)   General: Well-developed, well-nourished. No acute distress CV: Regular rate, rhythm. 2/6 systolic murmur appreciated Pulm: Normal work of breathing. Clear to auscultation bilaterally. MSK: Normal bulk, tone. No pitting  edema bilaterally. Skin: Warm, dry. No lesions or rashes appreciated. Neuro: Awake, alert, answering questions appropriate No focal deficits.  Assessment & Plan:   See Encounters Tab for problem based charting.  Patient discussed with Dr. Philipp Ovens

## 2020-08-24 NOTE — Patient Instructions (Addendum)
Mr.Mykael Royston Bake, it was a pleasure seeing you today!  I have ordered the following labs today:   Lab Orders  Glucose, capillary  TSH  Vitamin B12  POC Hbg A1C    Tests ordered today:  None  Referrals ordered today:    Referral Orders  Amb Referral to Clinical Pharmacist  AMB Referral to [place]    I have ordered the following medication/changed the following medications:   Stop the following medications: Medications Discontinued During This Encounter  Medication Reason   metFORMIN (GLUCOPHAGE) 500 MG tablet      Start the following medications: Meds ordered this encounter  Medications   metFORMIN (GLUCOPHAGE) 500 MG tablet    Sig: Take 2 tablets (1,000 mg total) by mouth 2 (two) times daily with a meal.    Dispense:  90 tablet    Refill:  3     Follow-up:  1 month     Today we discussed: Diabetes - It is going to be very important for you to follow with your medication as prescribed. We will re-check in one month to see how you are doing. Please remember to bring your glucometer.  Please make sure to arrive 15 minutes prior to your next appointment. If you arrive late, you may be asked to reschedule.   We look forward to seeing you next time. Please call our clinic at [phone] if you have any questions or concerns. The best time to call is Monday-Friday from 9am-4pm, but there is someone available 24/7. If after hours or the weekend, call the main hospital number and ask for the Internal Medicine Resident On-Call. If you need medication refills, please notify your pharmacy one week in advance and they will send [place] a request.  Thank you for letting us take part in your care. Wishing you the best!  Thank you, [name], MD

## 2020-08-25 ENCOUNTER — Telehealth: Payer: Self-pay

## 2020-08-25 DIAGNOSIS — G3184 Mild cognitive impairment, so stated: Secondary | ICD-10-CM | POA: Insufficient documentation

## 2020-08-25 DIAGNOSIS — R4189 Other symptoms and signs involving cognitive functions and awareness: Secondary | ICD-10-CM | POA: Insufficient documentation

## 2020-08-25 LAB — TSH: TSH: 0.981 u[IU]/mL (ref 0.450–4.500)

## 2020-08-25 LAB — VITAMIN B12: Vitamin B-12: 365 pg/mL (ref 232–1245)

## 2020-08-25 NOTE — Assessment & Plan Note (Signed)
Mark Cabrera is being seen today for a follow-up for his diabetes. His A1c today is 12.9%, up from 8.3% at last check. [name] reports that he does not take his medications as prescribed, partially because he forgets to take them. Initially describes that he always takes his [place], but later details that he sometimes will forget his morning dose as well. He did not bring his glucometer into the appointment today, but reports that his sugars are consistently elevated in the 200-300's without dropping below 150. Further reports that he usually does not take his insulin during the day, as he feels he doesn't need it often. He denies polydipsia, polyuria, nausea, vomiting.   I emphasized the importance of compliance with his diabetic regimen given that he is high risk for further cardiovascular events. Unfortunately, [name] is not under direct supervision with an ALF anymore and is at home with his roommate. Please see "mild cognitive impairment" section for more information. Today will not change his insulin regimen but will up-titrate his metformin. Will refer to our clinical pharmacist for further aid. - Increase metformin 1000mg  twice daily - Soliqua 40 units daily - Novolog 18 units three times daily

## 2020-08-25 NOTE — Assessment & Plan Note (Addendum)
During our visit today, [name] reports that he recently left his assisted living facility. He further describes incidents where he did not feel as though he was being properly cared for, including not giving his insulin as prescribed. There were many times where he reports he refused insulin with the nursing staff because they did not check his sugars beforehand. In addition, he reports that the living facility owed him money while he was living there, but never received this. He further describes that one day, in front of his daughter and the staff, he banged his fist against a wall which made a dent. He furiously stormed out of the facility and had his friend pick him up. He is currently living with an elderly woman. [name] states that he handles his own medications but they both help around the house.  I asked [name] if I could call his daughter to further discuss his recent behaviors. [name] did give permission, although I was unable to reach Ms. [name] via telephone. During our visit, I did perform a mini-cognitive assessment, to which [name] scored 4/5. He does mention that he has been experiencing some memory issues, but he feels like they are not significant.  Today will check TSH, B12 for any underlying cause for his mild cognitive impairment. Will need to continue to closely monitor and hopefully involve his family. Given he is already having issues remembering his medications, I am concerned that he will need assisted facility or home health. Would suggest further investigation at his next appointment. - TSH, vitamin B12 today - Community care referral - Follow-up with patient's daughter, Ms. [name] - [facility] at his next appointment

## 2020-08-25 NOTE — Telephone Encounter (Signed)
   Telephone encounter was:  Successful.  08/25/2020 Name: Mark Cabrera MRN: [id number] DOB: [birth year]  Mark Cabrera is a 70 y.o. year old male who is a primary care patient of [name], MD . The community resource team was consulted for assistance with Transportation Needs  and [place] guide performed the following interventions: Left message on voicemail for patient to return my call regarding food and housing resources. Spoke with patient's daughter Mark Cabrera emailed housing list to [email or web address] per request and emailed referral to [employer] with her permission.  Follow Up Plan:  Care guide will follow up with patient by phone over the next 7 days.  Mark Cabrera, AAS Paralegal, [facility] [facility]  [address], [place] [address] ??[email or web address]  ?? [phone]   [email or web address]

## 2020-08-26 ENCOUNTER — Telehealth: Payer: Self-pay

## 2020-08-26 NOTE — Telephone Encounter (Signed)
   Telephone encounter was:  Unsuccessful.  08/26/2020 Name: ADONTE VANRIPER MRN: [id number] DOB: [birth year]  Unsuccessful outbound call made today to assist with:  Left message on voicemail for Lavenia Atlas at [facility] on Wheels patient lives in the part of [place] that is in [place]. Also sent email referral for [facility].    Outreach Attempt:  1st Attempt  A HIPAA compliant voice message was left requesting a return call.  Instructed patient to call back at [phone].  Sema Stangler, AAS Paralegal, [place] [facility]  [address], [place] [address] ??[email or web address]  ?? [phone]   [email or web address]

## 2020-08-27 ENCOUNTER — Telehealth: Payer: Self-pay

## 2020-08-27 NOTE — Telephone Encounter (Signed)
   Telephone encounter was:  Successful.  08/27/2020 Name: SAI ZINN MRN: [id number] DOB: 1950/04/13  Britt Bolognese Gabbard is a 70 y.o. year old male who is a primary care patient of [name], MD . The community resource team was consulted for assistance with  food and housing  Care guide performed the following interventions: Spoke with patient he confirmed that [place] at [employer] has contacted him and he is aware his meals will start next week.  Mr. Gassman also stated that he does not need housing at this time he is living in a house with a friend and his rent is affordable.                                                        Received return call from Lavenia Atlas at [facility] on [place] [address] she has spoken to Mr. Gries and his meals will start next week..  Follow Up Plan:  No further follow up planned at this time. The patient has been provided with needed resources.  Keyton Bhat, AAS Paralegal, [facility]  [address], [place] [address] ??[email or web address]  ?? [phone]   [email or web address]

## 2020-09-02 NOTE — Progress Notes (Signed)
Internal Medicine Attending Note:  This patient's plan of care was discussed with the house staff. Please see their note for complete details. I concur with their findings.  Velna Ochs, MD 09/02/2020, 2:05 PM

## 2020-09-14 ENCOUNTER — Encounter: Payer: Self-pay | Admitting: *Deleted

## 2020-09-16 DIAGNOSIS — Z20822 Contact with and (suspected) exposure to covid-19: Secondary | ICD-10-CM | POA: Diagnosis not present

## 2020-09-21 ENCOUNTER — Ambulatory Visit (INDEPENDENT_AMBULATORY_CARE_PROVIDER_SITE_OTHER): Payer: Medicare Other | Admitting: Student

## 2020-09-21 ENCOUNTER — Encounter: Payer: Self-pay | Admitting: Student

## 2020-09-21 ENCOUNTER — Other Ambulatory Visit: Payer: Self-pay

## 2020-09-21 ENCOUNTER — Ambulatory Visit (HOSPITAL_COMMUNITY)
Admission: RE | Admit: 2020-09-21 | Discharge: 2020-09-21 | Disposition: A | Discharge status: Home / Self Care | Payer: Medicare Other | Source: Ambulatory Visit | Attending: Internal Medicine | Admitting: Internal Medicine

## 2020-09-21 ENCOUNTER — Encounter: Payer: Medicare Other | Admitting: Pharmacist

## 2020-09-21 VITALS — BP 122/78 | HR 75 | Temp 97.9°F | Ht 68.0 in | Wt 206.9 lb

## 2020-09-21 DIAGNOSIS — Z7901 Long term (current) use of anticoagulants: Secondary | ICD-10-CM

## 2020-09-21 DIAGNOSIS — G3184 Mild cognitive impairment, so stated: Secondary | ICD-10-CM

## 2020-09-21 DIAGNOSIS — Z794 Long term (current) use of insulin: Secondary | ICD-10-CM | POA: Diagnosis not present

## 2020-09-21 DIAGNOSIS — I2510 Atherosclerotic heart disease of native coronary artery without angina pectoris: Secondary | ICD-10-CM

## 2020-09-21 DIAGNOSIS — R0600 Dyspnea, unspecified: Secondary | ICD-10-CM

## 2020-09-21 LAB — BRAIN NATRIURETIC PEPTIDE: B Natriuretic Peptide: 242.8 pg/mL — ABNORMAL HIGH (ref 0.0–100.0)

## 2020-09-21 NOTE — Progress Notes (Signed)
   CC: dyspnea  HPI:  [name] is a 70 y.o. with medical history as below presenting to [facility] for dyspnea  Please see problem-based list for further details, assessments, and plans.  Past Medical History:  Diagnosis Date   Acute diastolic congestive heart failure ([place])    Aortic valve stenosis s/p AVR 2018   Echo 2/22: Poor acoustic windows, EF 50-55, mild LVH, normal RVSF, mild MR, trivial AI, no AS, no pericardial effusion   Atrial fibrillation (HCC) - post-op CABG    04/2016 CHA2DS2VAS score = 5   Atypical nevi    Coronary artery disease s/p 2 vessel CABG    Depression    "years ago"   Diabetes mellitus    Dyspnea    in the past    GERD (gastroesophageal reflux disease)    Heart murmur    Hepatic cirrhosis ([place]) 06/29/2017   History of kidney stones    Hyperlipidemia    hx of transaminitis secondary to statin and he has decided not to use statins secondary to potential side effects.   Hypertension    Nephrolithiasis    Osteoarthritis, knee    Sleep apnea     Central apnea. 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 2-D echo was normal no embolic source found.   Review of Systems:  As per HPI  Physical Exam:  Vitals:   09/21/20 1427  BP: 108/69  Pulse: 76  Temp: 97.9 F (36.6 C)  TempSrc: Oral  SpO2: 99%  Weight: 206 lb 14.4 oz (93.8 kg)  Height: 5\' 8"  (1.727 m)   General: Resting comfortably in bed, no acute distress CV: Regular rate, rhythm. No murmurs, rubs, gallops appreciated. Pulm: Intermittent gasping of air during conversation, otherwise normal work of breathing on room air. Mild decreased breath  sounds on left lower lung field. Otherwise no wheezing, rales, rhonchi appreciated MSK: Normal bulk, tone. Small lipoma on dorsum of left arm proximal to elbow. Lipoma present in left axilla. Hip range of motion bilaterally normal. Trace bilateral lower extremity edema. Skin: Warm, dry. No rashes or lesions appreciated. Neuro: Awake, alert, answering questions appropriately. Cranial nerves in tact, motor 5/5 in upper extremities and RLE. 4/5 in LLE. Sensation in tact throughout. Finger to nose normal. Psych: Normal mood, affect, speech.  Assessment & Plan:   See Encounters Tab for problem based charting.  Patient discussed with Dr.  Jimmye Norman

## 2020-09-21 NOTE — Patient Instructions (Addendum)
[name], it was a pleasure seeing you today!  Today we discussed: - Breathing: We are going to get some lab work and a chest x-ray to further see what is going on. I will call you with those results.  I have ordered the following labs today:  Lab Orders  Brain natriuretic peptide  BMP8+Anion Gap     Tests ordered today:  - Chest X-ray  Follow-up:  1 month    Please make sure to arrive 15 minutes prior to your next appointment. If you arrive late, you may be asked to reschedule.   We look forward to seeing you next time. Please call our clinic at [phone] if you have any questions or concerns. The best time to call is Monday-Friday from 9am-4pm, but there is someone available 24/7. If after hours or the weekend, call the main hospital number and ask for the Internal Medicine Resident On-Call. If you need medication refills, please notify your pharmacy one week in advance and they will send [place] a request.  Thank you for letting us take part in your care. Wishing you the best!  Thank you, [name], MD

## 2020-09-22 ENCOUNTER — Encounter: Payer: Self-pay | Admitting: Student

## 2020-09-22 ENCOUNTER — Telehealth: Payer: Self-pay | Admitting: *Deleted

## 2020-09-22 LAB — BMP8+ANION GAP
Anion Gap: 17 mmol/L (ref 10.0–18.0)
BUN/Creatinine Ratio: 14 (ref 10–24)
BUN: 13 mg/dL (ref 8–27)
CO2: 21 mmol/L (ref 20–29)
Calcium: 9.9 mg/dL (ref 8.6–10.2)
Chloride: 97 mmol/L (ref 96–106)
Creatinine, Ser: 0.93 mg/dL (ref 0.76–1.27)
Glucose: 304 mg/dL — ABNORMAL HIGH (ref 65–99)
Potassium: 4.5 mmol/L (ref 3.5–5.2)
Sodium: 135 mmol/L (ref 134–144)
eGFR: 88 mL/min/{1.73_m2} (ref >59)

## 2020-09-22 NOTE — Chronic Care Management (AMB) (Signed)
  Care Management   Note  09/22/2020 Name: Mark Cabrera MRN: [id number] DOB: [birth year]  Mark Cabrera is a 70 y.o. year old male who is a primary care patient of [name], MD. I reached out to [name] by phone today in response to a referral sent by [name] PCP, [name], MD.    Mark Cabrera was given information about care management services today including:  Care management services include personalized support from designated clinical staff supervised by his physician, including individualized plan of care and coordination with other care providers 24/7 contact phone numbers for assistance for urgent and routine care needs. The patient may stop care management services at any time by phone call to the office staff.  Daughter [name] DPR on file  verbally agreed to assistance and services provided by embedded care coordination/care management team today.  Follow up plan: Telephone appointment with care management team member scheduled for:09/23/2020  [place] Management

## 2020-09-22 NOTE — Chronic Care Management (AMB) (Signed)
  Care Management   Outreach Note  09/22/2020 Name: Mark Cabrera MRN: [id number] DOB: 1950-12-22  Referred by: [name], MD Reason for referral : Care Coordination (Initial outreach to schedule referral with BSW)   An unsuccessful telephone outreach was attempted today. The patient was referred to the case management team for assistance with care management and care coordination.   Follow Up Plan: A HIPAA compliant phone message was left for the patient providing contact information and requesting a return call. The care management team will reach out to the patient again over the next 2 days.  If patient returns call to provider office, please advise to call [place] at [phone].  [place] Management

## 2020-09-23 ENCOUNTER — Telehealth: Payer: Self-pay | Admitting: Licensed Clinical Social Worker

## 2020-09-23 ENCOUNTER — Ambulatory Visit: Payer: Medicare Other | Admitting: Licensed Clinical Social Worker

## 2020-09-23 DIAGNOSIS — R0600 Dyspnea, unspecified: Secondary | ICD-10-CM | POA: Insufficient documentation

## 2020-09-23 NOTE — Telephone Encounter (Signed)
  Care Management   Follow Up Note   09/23/2020 Name: BRITON SELLMAN MRN: [id number] DOB: 04-16-1950   Referred by: [name], MD Reason for referral : No chief complaint on file.   An unsuccessful telephone outreach was attempted today. The patient was referred to the case management team for assistance with care management and care coordination.   Follow Up Plan: The care management team will reach out to the patient again over the next 30 days.   Milus Height, [place]  Social Worker [facility] Care Management  [phone]

## 2020-09-23 NOTE — Assessment & Plan Note (Addendum)
Mr. Bogus reports that overall he feels like he is doing okay with his current living situation. Mentions that he lives with a friend (72 year old male) and this has been going well. Notes that he can complete his ADL's but will sometimes need assistance with IADL's, such as cooking. States that he does not drive due to his vision impairment, so his friend drives them to the grocery store. Overall he feels safe in the home and says he has access to food and water. He states that he currently is not taking all of his medications because he does not feel as though they are necessary. He is only taking Eliquis and his insulin at this time.  He does mention an instance where he fell a few weeks ago. Describes getting up in the middle of the night and losing his balance. He fell on his right hip, but says it was just a small bruise. He currently has good range of motion without any signs of pelvic fracture. Denies any further falls.  Mr. Betker and I discussed his memory issues. He does say that he has had recent trouble with short term memory, such as forgetting where his keys are. Unfortunately, we do not have the ability to have corroborating history with family or his friend who he lives with. Mentions that he feels this has been stable, but this does worry him some.   During our visit today, I completed a MOCA assessment. His primary deficits were visuospatial/executive as well as memory. He scored 24/30, concerning for mild cognitive impairment. His previous [place] in 2020 scored 29/30. During his last visit with me, we checked TSH and vitamin B12 for reversible causes, both of which resulted normal. His PHQ-9 score today is 15, although noted that previous PHQ-9 scores have been inconsistent, ranging from 1-20 in subsequent visits. At this time he is not showing signs of any other primary neurologic disease, only residual lower left extremity weakness from previous injury. He does have obstructive sleep apnea  and does not use his CPAP. Will focus on optimizing cardiovascular risk factors to help prevent further progression of disease. However, this could prove difficult given no known care giver to assist him. Ideally would be able to discuss this with his daughter if possible.   - Chronic care management referral

## 2020-09-23 NOTE — Chronic Care Management (AMB) (Signed)
  Care Management   Social Work Visit Note  09/23/2020 Name: Mark Cabrera MRN: [id number] DOB: [birth year]  Mark Cabrera is a 70 y.o. year old male who sees [name], MD for primary care. The care management team was consulted for assistance with care management and care coordination needs related to Level of Care Concerns   Patient was given the following information about care management and care coordination services today, agreed to services, and gave verbal consent: 1.care management/care coordination services include personalized support from designated clinical staff supervised by their physician, including individualized plan of care and coordination with other care providers 2. 24/7 contact phone numbers for assistance for urgent and routine care needs. 3. The patient may stop care management/care coordination services at any time by phone call to the office staff.  Engaged with patient by telephone for initial visit in response to provider referral for social work chronic care management and care coordination services.  Assessment: Review of patient history, allergies, and health status during evaluation of patient need for care management/care coordination services.    Interventions:  Patient interviewed and appropriate assessments performed Collaborated with clinical team regarding patient needs  SW spoke with patients daughter Mark Cabrera). SW attempted patient, but was unsuccessful.  Ms. Waring advised PCS services are not needed.  Ms. Kent requested medication management for patients diabetes care.  Patient may benefit from meals on wheels.    SDOH (Social Determinants of Health) assessments performed: Yes     Plan:  SW will collaborate with Mark Cabrera- Diabetes Program Coordinator. SW sent a referral for mental health services through [place]. SW will attempt patient again within 7 days.   Mark Cabrera, Mark Cabrera  Social Worker [facility] Care Management   [phone]

## 2020-09-23 NOTE — Assessment & Plan Note (Signed)
Mark Cabrera reports that he has been having worsening dyspnea over the past few months. During our visit today, he frequently has to pause to catch his breath. Notes that he recently has needed to sleep in a more upright position in order to be comfortable. Describes only being able to walk roughly 100 feet before becoming short of breath and needing to sit down. Denies cough, fevers, chills, choking, audible wheezing. He has no known primary lung disease and has not been taking any of his medications except Eliquis and insulin.  On exam, there is mild decreased breath sounds on the left lower lung field without wheezing, rales, or rhonchi. He has trace bilateral lower extremity edema as well. He is currently sating well on room air. Upon ambulation he mildly desaturates to low 90's but quickly increases back to upper 90's upon rest. I am concerned that his heart failure is worsening given he is not taking his medications as prescribed. I discussed this with him, including encouraging him to take is lasix daily. Today we will obtain chest x-ray, BNP, and BMP for further evaluation. Unfortunately, I believe his living situation and mild cognitive impairment are compounding this issue.  - CXR, BNP, BMP - Patient to re-start lasix 40mg  daily - Follow-up in one month  ADDENDUM: Chest x-ray clear without effusion or signs of pulmonary congestion. BNP mildly elevated from previous. BMP normal. I was unable to reach Mark Cabrera via telephone, but have sent him a letter with further instructions to re-start his furosemide. I am concerned that Mark Cabrera could acutely worsen if he does not follow this. He is to return to clinic in one month.

## 2020-09-23 NOTE — Patient Instructions (Signed)
Visit Information  Instructions: SW will continue to outreach patient to assist with level of care.  Patient was given the following information about care management and care coordination services today, agreed to services, and gave verbal consent: 1.care management/care coordination services include personalized support from designated clinical staff supervised by their physician, including individualized plan of care and coordination with other care providers 2. 24/7 contact phone numbers for assistance for urgent and routine care needs. 3. The patient may stop care management/care coordination services at any time by phone call to the office staff.  Patient verbalizes understanding of instructions provided today and agrees to view in [place].   The care management team will reach out to the patient again over the next 7 days.   Milus Height, [place]  Social Worker [facility] Care Management  [phone]

## 2020-09-28 NOTE — Progress Notes (Signed)
Internal Medicine Clinic Attending ? ?Case discussed with Dr. Braswell  At the time of the visit.  We reviewed the resident?s history and exam and pertinent patient test results.  I agree with the assessment, diagnosis, and plan of care documented in the resident?s note.  ?

## 2020-10-10 ENCOUNTER — Other Ambulatory Visit: Payer: Self-pay | Admitting: Internal Medicine

## 2020-10-10 DIAGNOSIS — E1142 Type 2 diabetes mellitus with diabetic polyneuropathy: Secondary | ICD-10-CM

## 2020-10-13 ENCOUNTER — Ambulatory Visit: Payer: Medicare Other | Admitting: Licensed Clinical Social Worker

## 2020-10-13 NOTE — Chronic Care Management (AMB) (Signed)
  Care Management   Social Work Visit Note  10/13/2020 Name: ANASS CARNEGIE MRN: [id number] DOB: [birth year]  SW contacted - [name] ( daughter of patient) in attempts to contact Mr. Lerner. SW had a brief conversation regarding the needs of the patient. Ms. Mcfeeley wasn't sure. SW requested Ms. Schillaci to contact patient and requested patient to contact SW. SW has attempted multiple times and unsuccessful. Ms. Chesnut stated she would have patient contact SW.

## 2020-10-20 ENCOUNTER — Other Ambulatory Visit: Payer: Self-pay

## 2020-10-20 ENCOUNTER — Encounter: Payer: Self-pay | Admitting: Internal Medicine

## 2020-10-20 ENCOUNTER — Ambulatory Visit (INDEPENDENT_AMBULATORY_CARE_PROVIDER_SITE_OTHER): Payer: Medicare Other | Admitting: Student

## 2020-10-20 ENCOUNTER — Ambulatory Visit (INDEPENDENT_AMBULATORY_CARE_PROVIDER_SITE_OTHER): Payer: Medicare Other | Admitting: Pharmacist

## 2020-10-20 ENCOUNTER — Other Ambulatory Visit (HOSPITAL_COMMUNITY): Payer: Self-pay

## 2020-10-20 VITALS — BP 123/71 | HR 78 | Temp 98.1°F | Resp 32 | Ht 68.0 in | Wt 210.9 lb

## 2020-10-20 DIAGNOSIS — H538 Other visual disturbances: Secondary | ICD-10-CM | POA: Diagnosis not present

## 2020-10-20 DIAGNOSIS — M25512 Pain in left shoulder: Secondary | ICD-10-CM

## 2020-10-20 DIAGNOSIS — E1140 Type 2 diabetes mellitus with diabetic neuropathy, unspecified: Secondary | ICD-10-CM

## 2020-10-20 DIAGNOSIS — K7460 Unspecified cirrhosis of liver: Secondary | ICD-10-CM

## 2020-10-20 DIAGNOSIS — Z794 Long term (current) use of insulin: Secondary | ICD-10-CM

## 2020-10-20 DIAGNOSIS — R0600 Dyspnea, unspecified: Secondary | ICD-10-CM | POA: Diagnosis not present

## 2020-10-20 DIAGNOSIS — E1142 Type 2 diabetes mellitus with diabetic polyneuropathy: Secondary | ICD-10-CM

## 2020-10-20 DIAGNOSIS — H547 Unspecified visual loss: Secondary | ICD-10-CM | POA: Diagnosis not present

## 2020-10-20 DIAGNOSIS — I5032 Chronic diastolic (congestive) heart failure: Secondary | ICD-10-CM

## 2020-10-20 LAB — PROTIME-INR
INR: 1.3 — ABNORMAL HIGH (ref 0.8–1.2)
Prothrombin Time: 15.7 s — ABNORMAL HIGH (ref 11.4–15.2)

## 2020-10-20 MED ORDER — NOVOLOG FLEXPEN RELION 100 UNIT/ML ~~LOC~~ SOPN
PEN_INJECTOR | SUBCUTANEOUS | 0 refills | Status: DC
  Filled 2020-10-20: qty 45, fill #0

## 2020-10-20 MED ORDER — CONTOUR NEXT TEST VI STRP
ORAL_STRIP | 12 refills | Status: DC
  Filled 2020-10-20: qty 100, 30d supply, fill #0

## 2020-10-20 MED ORDER — ISOSORBIDE MONONITRATE ER 30 MG PO TB24
30.0000 mg | ORAL_TABLET | Freq: Every day | ORAL | 3 refills | Status: DC
  Filled 2020-10-20: qty 90, 90d supply, fill #0

## 2020-10-20 MED ORDER — FUROSEMIDE 40 MG PO TABS
40.0000 mg | ORAL_TABLET | Freq: Every day | ORAL | 11 refills | Status: DC
  Filled 2020-10-20: qty 30, 30d supply, fill #0

## 2020-10-20 MED ORDER — APIXABAN 5 MG PO TABS
ORAL_TABLET | Freq: Two times a day (BID) | ORAL | 5 refills | Status: DC
  Filled 2020-10-20: qty 60, 30d supply, fill #0

## 2020-10-20 MED ORDER — METFORMIN HCL 500 MG PO TABS
1000.0000 mg | ORAL_TABLET | Freq: Two times a day (BID) | ORAL | 3 refills | Status: DC
  Filled 2020-10-20: qty 90, 23d supply, fill #0

## 2020-10-20 MED ORDER — INSULIN PEN NEEDLE 31G X 5 MM MISC
3 refills | Status: DC
  Filled 2020-10-20: qty 100, 30d supply, fill #0

## 2020-10-20 MED ORDER — LISINOPRIL 10 MG PO TABS
10.0000 mg | ORAL_TABLET | Freq: Every day | ORAL | 3 refills | Status: DC
  Filled 2020-10-20: qty 180, 180d supply, fill #0

## 2020-10-20 MED ORDER — SOLIQUA 100-33 UNT-MCG/ML ~~LOC~~ SOPN: 40.0000 [IU] | PEN_INJECTOR | Freq: Every day | SUBCUTANEOUS | 3 refills | Status: DC

## 2020-10-20 NOTE — Progress Notes (Signed)
Subjective:    Patient ID: Mark Cabrera, male    DOB: [birth year], 70 y.o.   MRN: [id number]  HPI Patient is a 70 y.o. male who presents for diabetes management. He is in good spirits and presents with assistance of walking stick. Patient was referred and last seen by Primary Care Provider on 09/21/20.  Patient reports diabetes was diagnosed around 3 years ago.   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   Current diabetes medications include: Novolog 18 units TID with meals, Soliqua 40 units once daily, metformin '500mg'$  BID (was prescribed '1000mg'$  BID but reports not taking) Current hypertension medications include: lisinopril '10mg'$ , isosorbide mononitrate '30mg'$   Current hyperlipidemia medications include: Repatha '140mg'$ , Nexlizet 180-'10mg'$  Patient states that He is taking his medications as prescribed. Patient reports adherence with medications "80% of the time."  Patient states that He misses his medications 2 times per week, on average.  Do you feel that your medications are working for you?  yes  Have you been experiencing any side effects to the medications prescribed? no  Do you have any problems obtaining medications due to transportation or finances?  no     Patient denies hypoglycemic events. Patient denies polyuria (increased urination).  Patient denies polyphagia (increased appetite).  Patient denies polydipsia (increased thirst).  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 Patient reports self foot exams.   2 hour post-meal/random blood sugars: 168; usually 200's   Objective:   Labs:   Physical Exam Neurological:     Mental Status: He is alert and oriented to person, place, and time.    Review of Systems  Cardiovascular:  Positive for leg swelling.       Left leg has mild lower  extremity leg swelling. Patient reports this is normal.   Lab Results  Component Value Date   HGBA1C 12.9 (A) 08/24/2020   HGBA1C 8.3 (A) 06/09/2020   HGBA1C 9.4 (H) 02/21/2020    Lab Results  Component Value Date   MICRALBCREAT 7 07/11/2018    Lipid Panel     Component Value Date/Time   CHOL 133 04/09/2020 0814   TRIG 79 04/09/2020 0814   HDL 49 04/09/2020 0814   CHOLHDL 2.7 04/09/2020 0814   CHOLHDL 6.1 06/29/2018 0420   VLDL 48 (H) 06/29/2018 0420   LDLCALC 68 04/09/2020 0814    Clinical Atherosclerotic Cardiovascular Disease (ASCVD): Yes  The ASCVD Risk score Mikey Bussing DC Jr., et al., 2013) failed to calculate for the following reasons:   The patient has a prior MI or stroke diagnosis    Assessment/Plan:   T2DM is not controlled likely due to sub-optimal adherence. Control somewhat difficult to assess though due to patient infrequently monitoring blood glucose. Patient is forgetful so slight concerns that this may be causing adherence issues. Will work with patient on continued ways to help him properly take his medications. Will apply for CGM Libre 2.0 device to help patient monitor blood glucose. At that time will evaluate blood glucose trends and make adjustments as necessary. Following instruction patient verbalized understanding of treatment plan.    Continued Soliqua 40 units once daily Continued  rapid insulin Novolog 18 units TID with meals, Continued metformin '500mg'$  BID  Extensively discussed pathophysiology of diabetes, dietary effects on blood sugar control, and recommended lifestyle interventions.  Counseled on s/sx of and management of hypoglycemia Next A1C anticipated September  2022.   Follow-up appointment in four weeks to review sugar readings after patient obtains CGM. Written patient instructions provided.  This appointment required 60 minutes of direct patient care.  Thank you for involving pharmacy to assist in providing this patient's care.

## 2020-10-20 NOTE — Assessment & Plan Note (Signed)
Assessment: Patient was seen during his last visit for dyspnea for the past few months.  It was thought to be secondary to hypervolemia and patient was instructed to restart his Lasix daily.  Despite this he notes continued shortness of breath.  He notes that the shortness of breath only occurs when he lays on his right side.  The shortness of breath improves when he lies flat, sits upright and lies on his left side.  States that he does wake up in the middle of the night feeling short of breath gasping for air.  He does have a history of OSA but does not wear CPAP.  Repeated sleep studies have been ordered in the past but the patient has not done these.  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  At follow-up visit we will have patient have his O2 sats checked with different positions.  We will also have patient follow-up with GI for his cirrhosis.  With his shortness of breath improving with lying flat, I have a low suspicion that this is secondary to hypervolemia and/or congestive heart failure.  Plan: -At follow-up visit in 2 weeks have patient have his oxygen levels checked with different positions. -Patient would not like to repeat sleep study to allow [place] to reorder CPAP machine, discussed with him at follow-up visit.

## 2020-10-20 NOTE — Addendum Note (Signed)
Addended by: Riesa Pope on: 10/20/2020 06:03 PM   Modules accepted: Orders

## 2020-10-20 NOTE — Assessment & Plan Note (Addendum)
Assessment: Patient with history of hepatic cirrhosis.  Does not appear as though patient has had repeat testing of follow-up with GI in some time.  Patient denies history of alcohol use.  Suspect this could be NAFDL secondary to after with his history of hyperlipidemia, type 2 diabetes and obesity.  Will order CMP, PT/INR.  Also instructed patient to upper GI.  Plan: -Repeat CMP, PT INR -Repeat right upper quadrant ultrasound -Refer to GI/hepatology -If evidence of ascites on right upper quadrant ultrasound, consider starting spironolactone  Addendum PT/INR elevated.  Liver enzymes, alk phos, T bili, albumin all within normal limits.  2-3.  Patient will follow up with GI. RUQ [place] consistent with hepatic cirrhosis. Patient pending referral to GI/Hepatic Specialist.

## 2020-10-20 NOTE — Assessment & Plan Note (Signed)
Assessment: Patient states that he has had left shoulder pain for the past few weeks.  States that the pain is worse with reaching.  On my examination his liftoff test as well as empty can test and drop arm test are negative.  Lowering my suspicion that this is a rotator cuff tendinopathy.  Due to the nature of the pain being worse with reaching I do suspect an impingement syndrome and believe that patient would benefit from a steroid injection.  We will have patient follow-up with 2 weeks to have this done.   Plan: -Plan for steroid injection of the left shoulder at next visit

## 2020-10-20 NOTE — Patient Instructions (Addendum)
Mr. Austerman it was a pleasure seeing you today.   Please do the following:  Continue checking blood sugars at home. It's really important that you record these and bring these in to your next doctor's appointment.  Continue making the lifestyle changes we've discussed together during our visit. Diet and exercise play a significant role in improving your blood sugars.  Follow-up with me when you receive your CGM supplies at [phone]. We are going to send all your prescriptions to [facility] so they can mail them to you. Please call them at [phone] to set this up.    Hypoglycemia or low blood sugar:   Low blood sugar can happen quickly and may become an emergency if not treated right away.   While this shouldn't happen often, it can be brought upon if you skip a meal or do not eat enough. Also, if your insulin or other diabetes medications are dosed too high, this can cause your blood sugar to go to low.   Warning signs of low blood sugar include: Feeling shaky or dizzy Feeling weak or tired  Excessive hunger Feeling anxious or upset  Sweating even when you aren't exercising  What to do if I experience low blood sugar? Follow the Rule of 15 Check your blood sugar with your meter. If lower than 70, proceed to step 2.  Treat with 15 grams of fast acting carbs which is found in 3-4 glucose tablets. If none are available you can try hard candy, 1 tablespoon of sugar or honey,4 ounces of fruit juice, or 6 ounces of REGULAR soda.  Re-check your sugar in 15 minutes. If it is still below 70, do what you did in step 2 again. If your blood sugar has come back up, go ahead and eat a snack or small meal made up of complex carbs (ex. Whole grains) and protein at this time to avoid recurrence of low blood sugar.

## 2020-10-20 NOTE — Patient Instructions (Addendum)
Thank you, Mr.Olegario Royston Bake for allowing [place] to provide your care today. Today we discussed   Shortness of breath You continue to have some shortness of breath, this can be secondary to your liver disease.  We will be continuing your work-up for liver disease as well as putting in a referral for you to be seen by the GI doctors.  Sometimes liver disease can cause you to feel short of breath with certain positions.  Difficulty with vision At your request I will be placing a referral for you to be seen by Dr. Herbert Deaner for your difficulty seeing.  Diabetes Please continue to follow with Dr. Claiborne Billings for your diabetes management.  Left shoulder pain Please schedule an appointment with the front desk to be seen for your left shoulder pain and for a steroid injection.  I will be sending all of your medications to the [facility] so they can be delivered to your house.  I have ordered the following labs for you:   Lab Orders  CBC no Diff  CMP14 + Anion Gap  Protime-INR     Referrals ordered today:    Referral Orders  Ambulatory referral to Ophthalmology  Ambulatory referral to Gastroenterology    I have ordered the following medication/changed the following medications:   Stop the following medications: There are no discontinued medications.   Start the following medications: No orders of the defined types were placed in this encounter.    Follow up:  1 month  f/u shortness of breath, diabetes. Please also schedule an appointment to have a steroid injection performed on your left shoulder.  Should you have any questions or concerns please call the internal medicine clinic at [phone].     Sanjuana Letters, D.O. [place]

## 2020-10-20 NOTE — Assessment & Plan Note (Signed)
T2DM is not controlled likely due to sub-optimal adherence. Control somewhat difficult to assess though due to patient infrequently monitoring blood glucose. Patient is forgetful so slight concerns that this may be causing adherence issues. Will work with patient on continued ways to help him properly take his medications. Will apply for CGM Libre 2.0 device to help patient monitor blood glucose. At that time will evaluate blood glucose trends and make adjustments as necessary. Following instruction patient verbalized understanding of treatment plan.    1. Continued Soliqua 40 units once daily 2. Continued  rapid insulin Novolog 18 units TID with meals, 3. Continued metformin '500mg'$  BID  4. Extensively discussed pathophysiology of diabetes, dietary effects on blood sugar control, and recommended lifestyle interventions.  5. Counseled on s/sx of and management of hypoglycemia 6. Next A1C anticipated September 2022.   Follow-up appointment in four weeks to review sugar readings after patient obtains CGM. Written patient instructions provided.

## 2020-10-20 NOTE — Progress Notes (Addendum)
CC: Shortness of breath, left shoulder pain  HPI:  [name] is a 70 y.o. male with a past medical history stated below and presents today for follow-up for his shortness of breath and a new concern of left shoulder pain. Please see problem based assessment and plan for additional details.  Past Medical History:  Diagnosis Date   Acute diastolic congestive heart failure ([place])    Aortic valve stenosis s/p AVR 2018   Echo 2/22: Poor acoustic windows, EF 50-55, mild LVH, normal RVSF, mild MR, trivial AI, no AS, no pericardial effusion   Atrial fibrillation ([facility]) - post-op CABG    04/2016 CHA2DS2VAS score = 5   Atypical nevi    Coronary artery disease s/p 2 vessel CABG    Depression    "years ago"   Diabetes mellitus    Dyspnea    in the past    GERD (gastroesophageal reflux disease)    Heart murmur    Hepatic cirrhosis ([place]) 06/29/2017   History of kidney stones    Hyperlipidemia    hx of transaminitis secondary to statin and he has decided not to use statins secondary to potential side effects.   Hypertension    Nephrolithiasis    Osteoarthritis, knee    Sleep apnea     Central apnea. 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 2-D echo was normal no embolic source found.    Current Outpatient Medications on File Prior to Visit  Medication Sig Dispense Refill   acetaminophen (TYLENOL) 325 MG tablet Take 2 tablets (650 mg total) by mouth every 4 (four) hours as needed for headache or mild pain.     apixaban (ELIQUIS) 5 MG TABS tablet TAKE 1 TABLET (5 MG TOTAL) BY MOUTH 2 (TWO) TIMES DAILY. 60 tablet 5   Bayer Microlet  Lancets lancets Check blood sugar 3 times a day as instructed 100 each 12   Bempedoic Acid-Ezetimibe (NEXLIZET) 180-10 MG TABS Take 1 tablet by mouth daily. 90 tablet 3   furosemide (LASIX) 40 MG tablet Take 1 tablet (40 mg total) by mouth daily. 30 tablet 11   glucose blood (CONTOUR NEXT TEST) test strip Check blood sugar 3 times a day as instructed 100 each 12   glucose blood test strip USE 1 TO CHECK BLOOD GLUCOSE THREE TIMES DAILY AS DIRECTED. 100 strip 12   Insulin Pen Needle 31G X 5 MM MISC Use one pen needle three times daily. Dx E11.49 100 each 3   isosorbide mononitrate (IMDUR) 30 MG 24 hr tablet Take 1 tablet (30 mg total) by mouth daily. 90 tablet 3   lisinopril (ZESTRIL) 10 MG tablet Take 1 tablet (10 mg total) by mouth daily. 180 tablet 3   metFORMIN (GLUCOPHAGE) 500 MG tablet Take 2 tablets (1,000 mg total) by mouth 2 (two) times daily with a meal. 90 tablet 3   nitroGLYCERIN (NITROSTAT) 0.4 MG SL tablet Place 1 tablet (0.4 mg total) under the tongue every 5 (five) minutes as needed for chest pain. (Patient not taking: Reported on 10/20/2020) 75 tablet 2   NOVOLOG FLEXPEN 100 UNIT/ML FlexPen INJECT 18 UNITS  SUBCUTANEOUSLY THREE TIMES DAILY WITH MEALS 45 mL 0   REPATHA SURECLICK XX123456 MG/ML SOAJ INJECT 1 PEN INTO THE SKIN EVERY 14 (FOURTEEN) DAYS. 2 mL 11   SOLIQUA 100-33 UNT-MCG/ML SOPN Inject 40 Units into the skin daily. 15 mL 3   No current facility-administered medications on file prior to visit.    Family History  Problem Relation Age of Onset   Hypertension Mother    Diabetes Mother    Alcohol abuse Father     Social History   Socioeconomic History   Marital status: Divorced    Spouse name: Not on file   Number of children: 1   Years of education: 2y college   Highest education level: Not on file  Occupational History   Occupation:  Medicaid /disability    Employer: UNEMPLOYED    Comment: following stroke in 2007  Tobacco Use   Smoking status: Never   Smokeless  tobacco: Never  Vaping Use   Vaping Use: Never used  Substance and Sexual Activity   Alcohol use: Yes    Alcohol/week: 1.0 standard drink    Types: 1 Cans of beer per week    Comment: occasional   Drug use: No   Sexual activity: Not Currently  Other Topics Concern   Not on file  Social History Narrative   Single but has a girlfriend.    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       Patient has medications disability post stroke.            Social Determinants of Health   Financial Resource Strain: Not on file  Food Insecurity: No Food Insecurity   Worried About Charity fundraiser in the Last Year: Never true   Ran Out of Food in the Last Year: Never true  Transportation Needs: No Transportation Needs   Lack of Transportation (Medical): No   Lack of Transportation (Non-Medical): No  Physical Activity: Not on file  Stress: Not on file  Social Connections: Not on file  Intimate Partner Violence: Not on file   Review of Systems: ROS negative except for what is noted on the assessment and plan.  Vitals:   10/20/20 0916  BP: 123/71  Pulse: 78  Resp: (!) 32  Temp: 98.1 F (36.7 C)  TempSrc: Oral  SpO2: 99%  Weight: 210 lb 14.4 oz (95.7 kg)  Height: '5\' 8"'$  (1.727 m)    Physical Exam: Constitutional: well-appearing man no acute distress HENT: normocephalic atraumatic Eyes: conjunctiva non-erythematous Neck: supple Cardiovascular: regular rate and rhythm, systolic murmur 2 out of 6 lower extremities with trace edema Pulmonary/Chest: normal work of breathing on room air, some crackles of the left lung base Abdominal: soft, non-tender, non-distended. MSK: normal bulk and tone.  No left shoulder deformity or tenderness with palpation.  Tenderness with passive left shoulder abduction.  Unable to abduct past 90 degrees.  Negative liftoff and empty can test.  Negative drop arm test. Neurological: alert & oriented x 3 Skin: warm and  dry Psych: Normal mood and thought process   Assessment & Plan:   See Encounters Tab for problem based charting.  Patient discussed with Dr. Donnita Falls, D.O. [facility], PGY-2 Pager: [phone], Phone: [phone] Date 10/20/2020 Time 5:13 PM

## 2020-10-20 NOTE — Assessment & Plan Note (Signed)
Assessment: Patient endorses longstanding history of blurry vision.  States he has had cataract surgery in both eyes.  He would like to see a different eye doctor at this time, will refer him to his requested provider.  Plan: -Referral placed to ophthalmology

## 2020-10-21 ENCOUNTER — Other Ambulatory Visit (HOSPITAL_COMMUNITY): Payer: Self-pay

## 2020-10-21 LAB — CBC
Hematocrit: 39.8 % (ref 37.5–51.0)
Hemoglobin: 13.3 g/dL (ref 13.0–17.7)
MCH: 30.2 pg (ref 26.6–33.0)
MCHC: 33.4 g/dL (ref 31.5–35.7)
MCV: 90 fL (ref 79–97)
Platelets: 203 10*3/uL (ref 150–450)
RBC: 4.41 x10E6/uL (ref 4.14–5.80)
RDW: 13.4 % (ref 11.6–15.4)
WBC: 3.1 10*3/uL — ABNORMAL LOW (ref 3.4–10.8)

## 2020-10-21 LAB — CMP14 + ANION GAP
ALT: 37 IU/L (ref 0–44)
AST: 37 IU/L (ref 0–40)
Albumin/Globulin Ratio: 1.8 (ref 1.2–2.2)
Albumin: 4.7 g/dL (ref 3.7–4.7)
Alkaline Phosphatase: 65 IU/L (ref 44–121)
Anion Gap: 20 mmol/L — ABNORMAL HIGH (ref 10.0–18.0)
BUN/Creatinine Ratio: 14 (ref 10–24)
BUN: 15 mg/dL (ref 8–27)
Bilirubin Total: 0.8 mg/dL (ref 0.0–1.2)
CO2: 22 mmol/L (ref 20–29)
Calcium: 9.7 mg/dL (ref 8.6–10.2)
Chloride: 98 mmol/L (ref 96–106)
Creatinine, Ser: 1.06 mg/dL (ref 0.76–1.27)
Globulin, Total: 2.6 g/dL (ref 1.5–4.5)
Glucose: 258 mg/dL — ABNORMAL HIGH (ref 65–99)
Potassium: 4.7 mmol/L (ref 3.5–5.2)
Sodium: 140 mmol/L (ref 134–144)
Total Protein: 7.3 g/dL (ref 6.0–8.5)
eGFR: 75 mL/min/{1.73_m2} (ref >59)

## 2020-10-26 NOTE — Progress Notes (Signed)
Internal Medicine Clinic Attending  Case discussed with Dr. Katsadouros  At the time of the visit.  We reviewed the resident's history and exam and pertinent patient test results.  I agree with the assessment, diagnosis, and plan of care documented in the resident's note.  

## 2020-11-08 ENCOUNTER — Emergency Department: Payer: Medicare Other

## 2020-11-08 ENCOUNTER — Inpatient Hospital Stay
Admission: EM | Admit: 2020-11-08 | Discharge: 2020-11-10 | DRG: 690 | Disposition: A | Discharge status: Home Health | Payer: Medicare Other | Attending: Internal Medicine | Admitting: Internal Medicine

## 2020-11-08 ENCOUNTER — Other Ambulatory Visit: Payer: Self-pay

## 2020-11-08 DIAGNOSIS — Z8673 Personal history of transient ischemic attack (TIA), and cerebral infarction without residual deficits: Secondary | ICD-10-CM

## 2020-11-08 DIAGNOSIS — R404 Transient alteration of awareness: Secondary | ICD-10-CM | POA: Diagnosis not present

## 2020-11-08 DIAGNOSIS — B9689 Other specified bacterial agents as the cause of diseases classified elsewhere: Secondary | ICD-10-CM | POA: Diagnosis present

## 2020-11-08 DIAGNOSIS — N3001 Acute cystitis with hematuria: Principal | ICD-10-CM | POA: Diagnosis present

## 2020-11-08 DIAGNOSIS — Z952 Presence of prosthetic heart valve: Secondary | ICD-10-CM

## 2020-11-08 DIAGNOSIS — I480 Paroxysmal atrial fibrillation: Secondary | ICD-10-CM | POA: Diagnosis present

## 2020-11-08 DIAGNOSIS — G4730 Sleep apnea, unspecified: Secondary | ICD-10-CM | POA: Diagnosis present

## 2020-11-08 DIAGNOSIS — Z66 Do not resuscitate: Secondary | ICD-10-CM | POA: Diagnosis present

## 2020-11-08 DIAGNOSIS — G4731 Primary central sleep apnea: Secondary | ICD-10-CM | POA: Diagnosis present

## 2020-11-08 DIAGNOSIS — I110 Hypertensive heart disease with heart failure: Secondary | ICD-10-CM | POA: Diagnosis present

## 2020-11-08 DIAGNOSIS — I611 Nontraumatic intracerebral hemorrhage in hemisphere, cortical: Secondary | ICD-10-CM | POA: Diagnosis not present

## 2020-11-08 DIAGNOSIS — Z79899 Other long term (current) drug therapy: Secondary | ICD-10-CM

## 2020-11-08 DIAGNOSIS — F17200 Nicotine dependence, unspecified, uncomplicated: Secondary | ICD-10-CM | POA: Diagnosis present

## 2020-11-08 DIAGNOSIS — I6381 Other cerebral infarction due to occlusion or stenosis of small artery: Secondary | ICD-10-CM | POA: Diagnosis not present

## 2020-11-08 DIAGNOSIS — D696 Thrombocytopenia, unspecified: Secondary | ICD-10-CM | POA: Diagnosis not present

## 2020-11-08 DIAGNOSIS — I2510 Atherosclerotic heart disease of native coronary artery without angina pectoris: Secondary | ICD-10-CM | POA: Diagnosis present

## 2020-11-08 DIAGNOSIS — M1990 Unspecified osteoarthritis, unspecified site: Secondary | ICD-10-CM | POA: Diagnosis present

## 2020-11-08 DIAGNOSIS — R197 Diarrhea, unspecified: Secondary | ICD-10-CM | POA: Diagnosis not present

## 2020-11-08 DIAGNOSIS — S80811A Abrasion, right lower leg, initial encounter: Secondary | ICD-10-CM | POA: Diagnosis present

## 2020-11-08 DIAGNOSIS — R29818 Other symptoms and signs involving the nervous system: Secondary | ICD-10-CM | POA: Diagnosis not present

## 2020-11-08 DIAGNOSIS — D6959 Other secondary thrombocytopenia: Secondary | ICD-10-CM | POA: Diagnosis present

## 2020-11-08 DIAGNOSIS — Y92003 Bedroom of unspecified non-institutional (private) residence as the place of occurrence of the external cause: Secondary | ICD-10-CM

## 2020-11-08 DIAGNOSIS — Z8679 Personal history of other diseases of the circulatory system: Secondary | ICD-10-CM

## 2020-11-08 DIAGNOSIS — E785 Hyperlipidemia, unspecified: Secondary | ICD-10-CM | POA: Diagnosis present

## 2020-11-08 DIAGNOSIS — I5032 Chronic diastolic (congestive) heart failure: Secondary | ICD-10-CM | POA: Diagnosis present

## 2020-11-08 DIAGNOSIS — N179 Acute kidney failure, unspecified: Secondary | ICD-10-CM | POA: Diagnosis not present

## 2020-11-08 DIAGNOSIS — G4733 Obstructive sleep apnea (adult) (pediatric): Secondary | ICD-10-CM | POA: Diagnosis present

## 2020-11-08 DIAGNOSIS — Z20822 Contact with and (suspected) exposure to covid-19: Secondary | ICD-10-CM | POA: Diagnosis present

## 2020-11-08 DIAGNOSIS — Z885 Allergy status to narcotic agent status: Secondary | ICD-10-CM

## 2020-11-08 DIAGNOSIS — Z7901 Long term (current) use of anticoagulants: Secondary | ICD-10-CM

## 2020-11-08 DIAGNOSIS — R296 Repeated falls: Secondary | ICD-10-CM | POA: Diagnosis present

## 2020-11-08 DIAGNOSIS — Z7984 Long term (current) use of oral hypoglycemic drugs: Secondary | ICD-10-CM

## 2020-11-08 DIAGNOSIS — N390 Urinary tract infection, site not specified: Secondary | ICD-10-CM

## 2020-11-08 DIAGNOSIS — R1032 Left lower quadrant pain: Secondary | ICD-10-CM | POA: Diagnosis not present

## 2020-11-08 DIAGNOSIS — H547 Unspecified visual loss: Secondary | ICD-10-CM

## 2020-11-08 DIAGNOSIS — E1165 Type 2 diabetes mellitus with hyperglycemia: Secondary | ICD-10-CM

## 2020-11-08 DIAGNOSIS — I639 Cerebral infarction, unspecified: Secondary | ICD-10-CM | POA: Diagnosis not present

## 2020-11-08 DIAGNOSIS — D1722 Benign lipomatous neoplasm of skin and subcutaneous tissue of left arm: Secondary | ICD-10-CM | POA: Diagnosis present

## 2020-11-08 DIAGNOSIS — E871 Hypo-osmolality and hyponatremia: Secondary | ICD-10-CM | POA: Diagnosis present

## 2020-11-08 DIAGNOSIS — Z880 Allergy status to penicillin: Secondary | ICD-10-CM

## 2020-11-08 DIAGNOSIS — D72819 Decreased white blood cell count, unspecified: Secondary | ICD-10-CM | POA: Diagnosis present

## 2020-11-08 DIAGNOSIS — Z87442 Personal history of urinary calculi: Secondary | ICD-10-CM

## 2020-11-08 DIAGNOSIS — E876 Hypokalemia: Secondary | ICD-10-CM

## 2020-11-08 DIAGNOSIS — G3184 Mild cognitive impairment, so stated: Secondary | ICD-10-CM | POA: Diagnosis present

## 2020-11-08 DIAGNOSIS — K7460 Unspecified cirrhosis of liver: Secondary | ICD-10-CM | POA: Diagnosis present

## 2020-11-08 DIAGNOSIS — Z833 Family history of diabetes mellitus: Secondary | ICD-10-CM

## 2020-11-08 DIAGNOSIS — I119 Hypertensive heart disease without heart failure: Secondary | ICD-10-CM | POA: Diagnosis present

## 2020-11-08 DIAGNOSIS — Z794 Long term (current) use of insulin: Secondary | ICD-10-CM

## 2020-11-08 DIAGNOSIS — J811 Chronic pulmonary edema: Secondary | ICD-10-CM | POA: Diagnosis not present

## 2020-11-08 DIAGNOSIS — F32A Depression, unspecified: Secondary | ICD-10-CM | POA: Diagnosis present

## 2020-11-08 DIAGNOSIS — J189 Pneumonia, unspecified organism: Secondary | ICD-10-CM | POA: Diagnosis present

## 2020-11-08 DIAGNOSIS — M549 Dorsalgia, unspecified: Secondary | ICD-10-CM | POA: Diagnosis not present

## 2020-11-08 DIAGNOSIS — I7774 Dissection of vertebral artery: Secondary | ICD-10-CM | POA: Diagnosis present

## 2020-11-08 DIAGNOSIS — D638 Anemia in other chronic diseases classified elsewhere: Secondary | ICD-10-CM | POA: Diagnosis present

## 2020-11-08 DIAGNOSIS — G319 Degenerative disease of nervous system, unspecified: Secondary | ICD-10-CM | POA: Diagnosis not present

## 2020-11-08 DIAGNOSIS — I447 Left bundle-branch block, unspecified: Secondary | ICD-10-CM | POA: Diagnosis not present

## 2020-11-08 DIAGNOSIS — R4189 Other symptoms and signs involving cognitive functions and awareness: Secondary | ICD-10-CM | POA: Diagnosis present

## 2020-11-08 DIAGNOSIS — W06XXXA Fall from bed, initial encounter: Secondary | ICD-10-CM | POA: Diagnosis present

## 2020-11-08 DIAGNOSIS — R9082 White matter disease, unspecified: Secondary | ICD-10-CM | POA: Diagnosis not present

## 2020-11-08 DIAGNOSIS — N3000 Acute cystitis without hematuria: Secondary | ICD-10-CM | POA: Diagnosis not present

## 2020-11-08 DIAGNOSIS — Z951 Presence of aortocoronary bypass graft: Secondary | ICD-10-CM

## 2020-11-08 DIAGNOSIS — Z811 Family history of alcohol abuse and dependence: Secondary | ICD-10-CM

## 2020-11-08 DIAGNOSIS — K219 Gastro-esophageal reflux disease without esophagitis: Secondary | ICD-10-CM | POA: Diagnosis present

## 2020-11-08 DIAGNOSIS — Z8249 Family history of ischemic heart disease and other diseases of the circulatory system: Secondary | ICD-10-CM

## 2020-11-08 DIAGNOSIS — Z888 Allergy status to other drugs, medicaments and biological substances status: Secondary | ICD-10-CM

## 2020-11-08 DIAGNOSIS — R739 Hyperglycemia, unspecified: Secondary | ICD-10-CM

## 2020-11-08 DIAGNOSIS — R531 Weakness: Secondary | ICD-10-CM

## 2020-11-08 DIAGNOSIS — S8011XA Contusion of right lower leg, initial encounter: Secondary | ICD-10-CM | POA: Diagnosis present

## 2020-11-08 DIAGNOSIS — R4182 Altered mental status, unspecified: Secondary | ICD-10-CM | POA: Diagnosis not present

## 2020-11-08 DIAGNOSIS — R54 Age-related physical debility: Secondary | ICD-10-CM | POA: Diagnosis present

## 2020-11-08 DIAGNOSIS — R5383 Other fatigue: Secondary | ICD-10-CM | POA: Diagnosis present

## 2020-11-08 DIAGNOSIS — R778 Other specified abnormalities of plasma proteins: Secondary | ICD-10-CM | POA: Diagnosis present

## 2020-11-08 LAB — COMPREHENSIVE METABOLIC PANEL
ALT: 34 U/L (ref 0–44)
AST: 32 U/L (ref 15–41)
Albumin: 3.4 g/dL — ABNORMAL LOW (ref 3.5–5.0)
Alkaline Phosphatase: 67 U/L (ref 38–126)
Anion gap: 13 (ref 5–15)
BUN: 16 mg/dL (ref 8–23)
CO2: 21 mmol/L — ABNORMAL LOW (ref 22–32)
Calcium: 8.9 mg/dL (ref 8.9–10.3)
Chloride: 92 mmol/L — ABNORMAL LOW (ref 98–111)
Creatinine, Ser: 1.28 mg/dL — ABNORMAL HIGH (ref 0.61–1.24)
GFR, Estimated: 59 mL/min — ABNORMAL LOW (ref >60)
Glucose, Bld: 488 mg/dL — ABNORMAL HIGH (ref 70–99)
Potassium: 4.1 mmol/L (ref 3.5–5.1)
Sodium: 126 mmol/L — ABNORMAL LOW (ref 135–145)
Total Bilirubin: 1.9 mg/dL — ABNORMAL HIGH (ref 0.3–1.2)
Total Protein: 6.9 g/dL (ref 6.5–8.1)

## 2020-11-08 LAB — RESP PANEL BY RT-PCR (FLU A&B, COVID) ARPGX2
Influenza A by PCR: NEGATIVE
Influenza B by PCR: NEGATIVE
SARS Coronavirus 2 by RT PCR: NEGATIVE

## 2020-11-08 LAB — CBC
HCT: 33.5 % — ABNORMAL LOW (ref 39.0–52.0)
Hemoglobin: 11.9 g/dL — ABNORMAL LOW (ref 13.0–17.0)
MCH: 31.2 pg (ref 26.0–34.0)
MCHC: 35.5 g/dL (ref 30.0–36.0)
MCV: 87.9 fL (ref 80.0–100.0)
Platelets: 108 10*3/uL — ABNORMAL LOW (ref 150–400)
RBC: 3.81 MIL/uL — ABNORMAL LOW (ref 4.22–5.81)
RDW: 12.9 % (ref 11.5–15.5)
WBC: 4.6 10*3/uL (ref 4.0–10.5)
nRBC: 0 % (ref 0.0–0.2)

## 2020-11-08 LAB — URINALYSIS, COMPLETE (UACMP) WITH MICROSCOPIC
Bilirubin Urine: NEGATIVE
Glucose, UA: >=500 mg/dL — AB
Ketones, ur: 20 mg/dL — AB
Nitrite: NEGATIVE
Protein, ur: NEGATIVE mg/dL
Specific Gravity, Urine: 1.026 (ref 1.005–1.030)
pH: 5 (ref 5.0–8.0)

## 2020-11-08 LAB — PROCALCITONIN: Procalcitonin: 4.45 ng/mL

## 2020-11-08 LAB — CBG MONITORING, ED
Glucose-Capillary: 461 mg/dL — ABNORMAL HIGH (ref 70–99)
Glucose-Capillary: 559 mg/dL (ref 70–99)
Glucose-Capillary: 434 mg/dL — ABNORMAL HIGH (ref 70–99)
Glucose-Capillary: 423 mg/dL — ABNORMAL HIGH (ref 70–99)
Glucose-Capillary: 328 mg/dL — ABNORMAL HIGH (ref 70–99)

## 2020-11-08 LAB — BRAIN NATRIURETIC PEPTIDE: B Natriuretic Peptide: 124.5 pg/mL — ABNORMAL HIGH (ref 0.0–100.0)

## 2020-11-08 LAB — BETA-HYDROXYBUTYRIC ACID: Beta-Hydroxybutyric Acid: 1.88 mmol/L — ABNORMAL HIGH (ref 0.05–0.27)

## 2020-11-08 LAB — HEMOGLOBIN A1C
Hgb A1c MFr Bld: 11.4 % — ABNORMAL HIGH (ref 4.8–5.6)
Mean Plasma Glucose: 280.48 mg/dL

## 2020-11-08 LAB — CK: Total CK: 69 U/L (ref 49–397)

## 2020-11-08 LAB — AMMONIA: Ammonia: <9 umol/L — ABNORMAL LOW (ref 9–35)

## 2020-11-08 LAB — TROPONIN I (HIGH SENSITIVITY)
Troponin I (High Sensitivity): 52 ng/L — ABNORMAL HIGH (ref <18)
Troponin I (High Sensitivity): 48 ng/L — ABNORMAL HIGH (ref <18)

## 2020-11-08 LAB — MAGNESIUM: Magnesium: 1.5 mg/dL — ABNORMAL LOW (ref 1.7–2.4)

## 2020-11-08 LAB — VITAMIN B12: Vitamin B-12: 428 pg/mL (ref 180–914)

## 2020-11-08 LAB — TSH: TSH: 2.004 u[IU]/mL (ref 0.350–4.500)

## 2020-11-08 MED ORDER — INSULIN ASPART 100 UNIT/ML IJ SOLN
0.0000 [IU] | Freq: Every day | INTRAMUSCULAR | Status: DC
  Administered 2020-11-08: 4 [IU] via SUBCUTANEOUS
  Administered 2020-11-09: 3 [IU] via SUBCUTANEOUS
  Filled 2020-11-08 (×2): qty 1

## 2020-11-08 MED ORDER — ACETAMINOPHEN 650 MG RE SUPP: 650.0000 mg | Freq: Four times a day (QID) | RECTAL | Status: DC | PRN

## 2020-11-08 MED ORDER — LACTATED RINGERS IV SOLN: INTRAVENOUS | Status: AC

## 2020-11-08 MED ORDER — INSULIN ASPART 100 UNIT/ML IJ SOLN
8.0000 [IU] | Freq: Once | INTRAMUSCULAR | Status: AC
  Administered 2020-11-08: 8 [IU] via SUBCUTANEOUS
  Filled 2020-11-08: qty 1

## 2020-11-08 MED ORDER — ESCITALOPRAM OXALATE 10 MG PO TABS
10.0000 mg | ORAL_TABLET | Freq: Every day | ORAL | Status: DC
  Administered 2020-11-08 – 2020-11-10 (×3): 10 mg via ORAL
  Filled 2020-11-08 (×3): qty 1

## 2020-11-08 MED ORDER — INSULIN GLARGINE-YFGN 100 UNIT/ML ~~LOC~~ SOLN
40.0000 [IU] | Freq: Every day | SUBCUTANEOUS | Status: DC
  Filled 2020-11-08: qty 0.4

## 2020-11-08 MED ORDER — SODIUM CHLORIDE 0.9 % IV SOLN
500.0000 mg | INTRAVENOUS | Status: DC
  Administered 2020-11-08 – 2020-11-09 (×2): 500 mg via INTRAVENOUS
  Filled 2020-11-08 (×3): qty 500

## 2020-11-08 MED ORDER — ONDANSETRON HCL 4 MG PO TABS: 4.0000 mg | ORAL_TABLET | Freq: Four times a day (QID) | ORAL | Status: DC | PRN

## 2020-11-08 MED ORDER — FUROSEMIDE 10 MG/ML IJ SOLN
40.0000 mg | Freq: Once | INTRAMUSCULAR | Status: AC
  Administered 2020-11-08: 40 mg via INTRAVENOUS
  Filled 2020-11-08: qty 4

## 2020-11-08 MED ORDER — INSULIN GLARGINE-YFGN 100 UNIT/ML ~~LOC~~ SOLN
40.0000 [IU] | Freq: Every day | SUBCUTANEOUS | Status: DC
  Administered 2020-11-08 – 2020-11-09 (×2): 40 [IU] via SUBCUTANEOUS
  Filled 2020-11-08 (×2): qty 0.4

## 2020-11-08 MED ORDER — NITROGLYCERIN 0.4 MG SL SUBL: 0.4000 mg | SUBLINGUAL_TABLET | SUBLINGUAL | Status: DC | PRN

## 2020-11-08 MED ORDER — ACETAMINOPHEN 325 MG PO TABS
650.0000 mg | ORAL_TABLET | Freq: Four times a day (QID) | ORAL | Status: DC | PRN
  Administered 2020-11-10: 650 mg via ORAL
  Filled 2020-11-08: qty 2

## 2020-11-08 MED ORDER — INSULIN ASPART 100 UNIT/ML IJ SOLN
0.0000 [IU] | Freq: Three times a day (TID) | INTRAMUSCULAR | Status: DC
  Administered 2020-11-08 – 2020-11-09 (×3): 9 [IU] via SUBCUTANEOUS
  Administered 2020-11-09: 5 [IU] via SUBCUTANEOUS
  Administered 2020-11-10: 3 [IU] via SUBCUTANEOUS
  Administered 2020-11-10: 5 [IU] via SUBCUTANEOUS
  Filled 2020-11-08 (×5): qty 1

## 2020-11-08 MED ORDER — METFORMIN HCL 500 MG PO TABS
1000.0000 mg | ORAL_TABLET | Freq: Two times a day (BID) | ORAL | Status: DC
  Administered 2020-11-08: 1000 mg via ORAL
  Filled 2020-11-08: qty 2

## 2020-11-08 MED ORDER — ASPIRIN 81 MG PO CHEW
324.0000 mg | CHEWABLE_TABLET | Freq: Once | ORAL | Status: AC
  Administered 2020-11-08: 324 mg via ORAL
  Filled 2020-11-08: qty 4

## 2020-11-08 MED ORDER — MAGNESIUM SULFATE IN D5W 1-5 GM/100ML-% IV SOLN
1.0000 g | Freq: Once | INTRAVENOUS | Status: AC
  Administered 2020-11-08: 1 g via INTRAVENOUS
  Filled 2020-11-08 (×2): qty 100

## 2020-11-08 MED ORDER — INSULIN ASPART 100 UNIT/ML IJ SOLN
10.0000 [IU] | Freq: Once | INTRAMUSCULAR | Status: AC
  Administered 2020-11-08: 10 [IU] via SUBCUTANEOUS
  Filled 2020-11-08: qty 1

## 2020-11-08 MED ORDER — INSULIN GLARGINE-LIXISENATIDE 100-33 UNT-MCG/ML ~~LOC~~ SOPN: 40.0000 [IU] | PEN_INJECTOR | Freq: Every day | SUBCUTANEOUS | Status: DC

## 2020-11-08 MED ORDER — SODIUM CHLORIDE 0.9 % IV SOLN
1.0000 g | Freq: Once | INTRAVENOUS | Status: AC
  Administered 2020-11-08: 1 g via INTRAVENOUS
  Filled 2020-11-08: qty 10

## 2020-11-08 MED ORDER — ONDANSETRON HCL 4 MG/2ML IJ SOLN
4.0000 mg | Freq: Four times a day (QID) | INTRAMUSCULAR | Status: DC | PRN
Start: 2020-11-08 — End: 2020-11-10

## 2020-11-08 MED ORDER — APIXABAN 5 MG PO TABS
5.0000 mg | ORAL_TABLET | Freq: Two times a day (BID) | ORAL | Status: DC
  Administered 2020-11-08 – 2020-11-10 (×4): 5 mg via ORAL
  Filled 2020-11-08 (×4): qty 1

## 2020-11-08 MED ORDER — ISOSORBIDE MONONITRATE ER 30 MG PO TB24
30.0000 mg | ORAL_TABLET | Freq: Every day | ORAL | Status: DC
  Administered 2020-11-09 – 2020-11-10 (×2): 30 mg via ORAL
  Filled 2020-11-08 (×2): qty 1

## 2020-11-08 NOTE — ED Notes (Signed)
Dr. Tobie Poet made aware of pt's hyperglycemia. Pt to receive insulin based on Sliding Scale.

## 2020-11-08 NOTE — ED Triage Notes (Addendum)
Pt arrives via [facility] with CC of fall. Pt was found in the floor. Pt states that he was attempting to reach urinal and fell. Pt reports "poor equilibrium" related to previous strokes. Pt reports chronic suicidal thoughts but denies any intention of acting on these thoughts. Verbal contract of safety obtained by this RN.

## 2020-11-08 NOTE — H&P (Addendum)
History and Physical   Mark Cabrera WSF:[id number] DOB: 1951-03-03 DOA: 11/08/2020  PCP: [name], MD  Patient coming from: Home via EMS  I have personally briefly reviewed patient's old medical records in [place].  Chief Concern: Weakness and fall  HPI: Mark Cabrera is a 70 y.o. male with medical history significant for aortic valve stenosis status post AVR, history of acute diastolic congestive heart failure, CAD status post two-vessel CABG, depression, diabetes, GERD, hepatic cirrhosis, history of kidney stones, hyperlipidemia, hypertension, osteoarthritis, central sleep apnea, not on CPAP, history of vertebral artery dissection in 2007, history of transaminitis secondary to statins, urgency department for chief concerns of weakness and fall.  At bedside, he was finishing his lunch, told me his full name, age, location of [place], and current year of 2022 and month of August.   He states that he was laying in bed on his right side and had to urinate, he turned over to pick up bucket at bedside and he slipped out of bed from that position.  He called out to his older roommate for help.  The older roommate in the house could not help to get him up.  EMS was called.  At bedside, he was able to move his hips and knees, in flexion and extension without difficulty.  He has not had many of his medications in the last several weeks.  He reports no changes to diet. He drinks about 4 glasses of 16oz, 1 sodas, about 1/2 qt of carrot or juice per day (1 qt in two days), milk, occasional tea and coffee.   He endorses shortness of breath when he lays flat night.  He does not wear his CPAP.  He states he knows he was diagnosed with sleep apnea however he does not wear his CPAP for at least 10 years.  Social history: He lives at home with a roommate. He does use tobacco products. He infrequently drinks etoh and last drink was a beer two weeks ago. He states that if he gets a 6-pack, it  will last one week. He denies recreational drug use. He is former Training and development officer, International aid/development worker.  He is disabled now due to poor vision and generalized weakness.  Vaccination history: He received one dose of J&J for covid 19.   ROS: Constitutional: no weight change, no fever ENT/Mouth: no sore throat, no rhinorrhea Eyes: no eye pain, no vision changes Cardiovascular: no chest pain, + dyspnea,  no edema, no palpitations Respiratory: no cough, no sputum, no wheezing Gastrointestinal: no nausea, no vomiting, no diarrhea, no constipation Genitourinary: no urinary incontinence, no dysuria, no hematuria Musculoskeletal: no arthralgias, no myalgias Skin: no skin lesions, no pruritus, Neuro: + weakness, no loss of consciousness, no syncope Psych: no anxiety, no depression, no decrease appetite Heme/Lymph: + bruising, no bleeding  ED Course: Discussed with emergency medicine provider, patient requiring hospitalization for chief concerns of weakness, hyponatremia, and urinary tract infection.  Vitals in the emergency department was remarkable for temperature 98.6, respiration rate of 17, heart rate 96, blood pressure 118/64, SPO2 of 99% on room air.  Serum sodium 126, potassium 4.1, chloride 92, bicarb 21, BUN of 18, serum creatinine of 1.28, nonfasting blood glucose 488, EGFR 59, BNP 124.5, CK 69, troponin high-sensitivity 52, UA was positive for leukocyte esterase.  WBC 4.6, hemoglobin 11.9, platelets 108.  In the emergency department patient was given aspirin 324 mg p.o. once,  aspart 8 units, ceftriaxone 1 g IV. Lasix 40 mg  IV given in the emergency department due to concerns of volume overload in setting of hyponatremia.  Assessment/Plan  Principal Problem:   Weakness Active Problems:   Dyslipidemia   Hypertensive heart disease   History of dissection of vertebral artery  (HCC)   Sleep apnea   Chronic diastolic heart failure (HCC)   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)    CAD (coronary artery disease), native coronary artery   S/P CABG x 2   Paroxysmal atrial fibrillation (HCC)   Decreased visual acuity   Lipoma of left upper extremity   Hepatic cirrhosis (HCC)   Depression   Fatigue   Mild cognitive impairment   Hyponatremia   # Generalized weakness - Etiology is multifactorial including urinary tract infection, poor nutrition, medication noncompliance, with obstructive sleep apnea - Community-acquired pneumonia - We will check a B12, TSH, magnesium - Check procalcitonin, which was elevated at 4.45 - Added azithromycin 500 milligrams IV - PT, OT ordered - Admit to MedSurg, observation, telemetry ordered for 24 hours  # Insulin-dependent diabetes mellitus-poorly controlled in setting of medication noncompliance - Blood glucose on presentation was 461, status post insulin aspart 8 units per EDP - Ordered additional 10 units of aspart as blood glucose on recheck was 559 - Resumed home long-acting insulin glargine-lizisenatide 100-33 unit-mg, 40 units subcu daily with first dose at 8 PM on day of admission - Check A1c - Insulin SSI with at bedtime coverage ordered renal dose as patient has acute kidney injury  # Pyuria-present on admission; continue ceftriaxone as above in treatment of community-acquired pneumonia - Doubt UTI as patient did not have dysuria  # Hyperglycemic hyponatremia-I suspect this is secondary to, noncompliance with diabetic treatment  - Status post Lasix 40 mg IV per ED - Strict I's and O's - Corrected sodium was 132 -Treat hyperglycemia  # Hyperglycemia-does not meet criteria for diabetic ketoacidosis as anion gap is not elevated - Beta hydroxybutyrate elevated at 1.88  # Acute kidney injury-I suspect this is prerenal in setting of profound hyperglycemia versus UTI - Treat as above  # Hypomagnesemia-replaced with 1 g of magnesium IV - Repeat magnesium labs in the a.m.  # Hypertension-isosorbide mononitrate 30 mg  daily  # Depression-escitalopram 10 mg daily  # Coronary artery disease history of CABG x2-Imdur 30 mg daily, insulin SSI - Does not take statin medication due to transaminitis and muscle aches  # Status post AVR-resumed home Eliquis twice daily  # Sleep apnea-CPAP nightly ordered  # Right sided anterior lateral lower extremity ecchymosis/excoriation- [facility], secondary to fall  Chart reviewed.   DVT prophylaxis: Apixaban 5 mg twice daily Code Status: Full code Diet: Heart healthy/carb modified Family Communication: No Disposition Plan: Pending clinical course Consults called: None at this time Admission status: MedSurg, observation, telemetry  Past Medical History:  Diagnosis Date   Acute diastolic congestive heart failure ([place])    Aortic valve stenosis s/p AVR 2018   Echo 2/22: Poor acoustic windows, EF 50-55, mild LVH, normal RVSF, mild MR, trivial AI, no AS, no pericardial effusion   Atrial fibrillation (HCC) - post-op CABG    04/2016 CHA2DS2VAS score = 5   Atypical nevi    Coronary artery disease s/p 2 vessel CABG    Depression    "years ago"   Diabetes mellitus    Dyspnea    in the past    GERD (gastroesophageal reflux disease)    Heart murmur    Hepatic cirrhosis ([place]) 06/29/2017  History of kidney stones    Hyperlipidemia    hx of transaminitis secondary to statin and he has decided not to use statins secondary to potential side effects.   Hypertension    Nephrolithiasis    Osteoarthritis, knee    Sleep apnea     Central apnea. 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 2-D echo was normal no embolic source  found.   Past Surgical History:  Procedure Laterality Date   AORTIC VALVE REPLACEMENT N/A 03/30/2016   Procedure: AORTIC VALVE REPLACEMENT (AVR);  Surgeon: [name], MD;  Location: [place];  Service: Open Heart Surgery;  Laterality: N/A;   CARDIAC CATHETERIZATION N/A 03/15/2016   Procedure: Right/Left Heart Cath and Coronary Angiography;  Surgeon: Burnell Blanks, MD;  Location: [facility];  Service: Cardiovascular;  Laterality: N/A;   CLIPPING OF ATRIAL APPENDAGE N/A 03/30/2016   Procedure: CLIPPING OF LEFT ATRIAL APPENDAGE;  Surgeon: [name], MD;  Location: [facility];  Service: Open Heart Surgery;  Laterality: N/A;   CORONARY ARTERY BYPASS GRAFT N/A 03/30/2016   Procedure: CORONARY ARTERY BYPASS GRAFTING (CABG) Times Two;  Surgeon: [name], MD;  Location: [place];  Service: Open Heart Surgery;  Laterality: N/A;   KNEE ARTHROSCOPY W/ PARTIAL MEDIAL MENISCECTOMY  05/12/2005   right, performed by Dr. French Ana for torn medial meniscus.   ROTATOR CUFF REPAIR Right 2016   STERNAL WIRES REMOVAL N/A 08/13/2017   Procedure: STERNAL WIRES REMOVAL;  Surgeon: [name], MD;  Location: [facility];  Service: Thoracic;  Laterality: N/A;   SUBXYPHOID PERICARDIAL WINDOW N/A 04/21/2016   Procedure: SUBXYPHOID PERICARDIAL WINDOW;  Surgeon: [name], MD;  Location: [facility];  Service: Thoracic;  Laterality: N/A;   TEE WITHOUT CARDIOVERSION N/A 03/30/2016   Procedure: TRANSESOPHAGEAL ECHOCARDIOGRAM (TEE);  Surgeon: [name], MD;  Location: [facility];  Service: Open Heart Surgery;  Laterality: N/A;   TEE WITHOUT CARDIOVERSION N/A 04/21/2016   Procedure: TRANSESOPHAGEAL ECHOCARDIOGRAM (TEE);  Surgeon: [name], MD;  Location: [facility];  Service: Thoracic;  Laterality: N/A;   Social History:  reports that he has never smoked. He has never used smokeless tobacco. He reports current alcohol use of about 1.0 standard drink per week. He reports that he  does not use drugs.  Allergies  Allergen Reactions   Morpholine Salicylate Other (See Comments)    Hallucinations   Penicillins Other (See Comments)    Allergic- reaction?? Has patient had a PCN reaction causing immediate rash, facial/tongue/throat swelling, SOB or lightheadedness with hypotension: Unk Has patient had a PCN reaction causing severe rash involving mucus membranes or skin necrosis: Unk Has patient had a PCN reaction that required hospitalization: Unk Has patient had a PCN reaction occurring within the last 10 years: No If all of the above answers are "NO", then may proceed with Cephalosporin use.  Other reaction(s): Unknown   Morphine And Related Other (See Comments)    Hallucinations    Sglt2 Inhibitors Rash and Other (See Comments)    Candidiasis infection prone   Statins Rash and Other (See Comments)    Severe  rash and back pain, and muscle pain   Family History  Problem Relation Age of Onset   Hypertension Mother    Diabetes Mother    Alcohol abuse Father    Family history: Family history reviewed and pertinent for hypertension in the mother.  Prior to Admission medications   Medication Sig Start Date End Date Taking? Authorizing Provider  acetaminophen (TYLENOL) 325 MG tablet Take 2 tablets (650 mg total) by mouth every 4 (four) hours as needed for headache or mild pain. 04/28/16   Isaiah Serge, NP  apixaban (ELIQUIS) 5 MG TABS tablet TAKE 1 TABLET (5 MG TOTAL) BY MOUTH 2 (TWO) TIMES DAILY. 10/20/20   Riesa Pope, MD  Bayer Microlet Lancets lancets Check blood sugar 3 times a day as instructed 05/26/19   Ladona Horns, MD  Bempedoic Acid-Ezetimibe (NEXLIZET) 180-10 MG TABS Take 1 tablet by mouth daily. 03/10/20   Mosetta Anis, MD  furosemide (LASIX) 40 MG tablet Take 1 tablet (40 mg total) by mouth daily. 10/20/20 10/20/21  Riesa Pope, MD  glucose blood (CONTOUR NEXT TEST) test strip Check blood sugar 3 times a day as instructed 10/20/20    Katsadouros, Vasilios, MD  glucose blood test strip USE 1 TO CHECK BLOOD GLUCOSE THREE TIMES DAILY AS DIRECTED. 03/15/20 03/15/21  Mosetta Anis, MD  insulin aspart (NOVOLOG FLEXPEN) 100 UNIT/ML FlexPen INJECT 18 UNITS UNDER THE SKIN THREE TIMES DAILY WITH MEALS 10/20/20   Katsadouros, Vasilios, MD  Insulin Pen Needle 31G X 5 MM MISC Use one pen needle three times daily. Dx E11.49 10/20/20   Riesa Pope, MD  isosorbide mononitrate (IMDUR) 30 MG 24 hr tablet Take 1 tablet (30 mg total) by mouth daily. 10/20/20   Katsadouros, Vasilios, MD  lisinopril (ZESTRIL) 10 MG tablet Take 1 tablet (10 mg total) by mouth daily. 10/20/20   Riesa Pope, MD  metFORMIN (GLUCOPHAGE) 500 MG tablet Take 2 tablets (1,000 mg total) by mouth 2 (two) times daily with a meal. 10/20/20   Katsadouros, Vasilios, MD  nitroGLYCERIN (NITROSTAT) 0.4 MG SL tablet Place 1 tablet (0.4 mg total) under the tongue every 5 (five) minutes as needed for chest pain. Patient not taking: Reported on 10/20/2020 06/21/20   Burnell Blanks, MD  REPATHA SURECLICK 063 MG/ML SOAJ INJECT 1 PEN INTO THE SKIN EVERY 14 (FOURTEEN) DAYS. 06/21/20   Burnell Blanks, MD  SOLIQUA 100-33 UNT-MCG/ML SOPN Inject 40 Units into the skin daily. 10/20/20   Riesa Pope, MD   Physical Exam: Vitals:   11/08/20 0922 11/08/20 0930 11/08/20 1000 11/08/20 1130  BP:  118/67 118/64 (!) 110/52  Pulse:  98 96 91  Resp:  _0 Temp:      TempSrc:      SpO2:  99% 99% 99%  Weight: 96.5 kg     Height: _1  (1.727 m)      Constitutional: appears younger than chronological age, NAD, calm, comfortable Eyes: PERRL, lids and conjunctivae normal ENMT: Mucous membranes are moist. Posterior pharynx clear of any exudate or lesions. Age-appropriate dentition. Hearing appropriate Neck: normal, supple, no masses, no thyromegaly Respiratory: clear to auscultation bilaterally, no wheezing, no crackles. Normal respiratory effort. No accessory  muscle use.  Cardiovascular: Regular rate and rhythm, no murmurs / rubs / gallops. No extremity edema. 2+ pedal pulses. No carotid bruits.  Abdomen: no tenderness, no masses palpated, no hepatosplenomegaly. Bowel sounds positive.  Musculoskeletal: no clubbing / cyanosis. No joint deformity upper and lower extremities.  Good ROM, no contractures, no atrophy. Normal muscle tone.  Skin: + lesions. No ulcers. No induration.  Right lower extremity anterior lateral excoriation and ecchymosis on the thigh present on admission Neurologic: Sensation intact. Strength 5/5 in all 4.  Psychiatric: Normal judgment and insight. Alert and oriented x 3. Normal mood.   EKG: independently reviewed, showing sinus tachycardia with rate of 104, QTc 470  Chest x-ray on Admission: I personally reviewed and I agree with radiologist reading as below.  CT HEAD WO CONTRAST (5MM)  Result Date: 11/08/2020 CLINICAL DATA:  Patient fell.  Found down. EXAM: CT HEAD WITHOUT CONTRAST CT CERVICAL SPINE WITHOUT CONTRAST TECHNIQUE: Multidetector CT imaging of the head and cervical spine was performed following the standard protocol without intravenous contrast. Multiplanar CT image reconstructions of the cervical spine were also generated. COMPARISON:  Head CT 06/29/2018. FINDINGS: CT HEAD FINDINGS Brain: There is no evidence for acute hemorrhage, hydrocephalus, mass lesion, or abnormal extra-axial fluid collection. No definite CT evidence for acute infarction. Diffuse loss of parenchymal volume is consistent with atrophy. Patchy low attenuation in the deep hemispheric and periventricular white matter is nonspecific, but likely reflects chronic microvascular ischemic demyelination. Nonacute parafalcine left occipital lobe infarct is new in the interval. Vascular: No hyperdense vessel or unexpected calcification. Skull: No evidence for fracture. No worrisome lytic or sclerotic lesion. Sinuses/Orbits: The visualized paranasal sinuses and  mastoid air cells are clear. Visualized portions of the intraorbital fat are unremarkable. Surgical changes noted left globe. Other: None. CT CERVICAL SPINE FINDINGS Alignment: Normal. Skull base and vertebrae: No acute fracture. No primary bone lesion or focal pathologic process. Soft tissues and spinal canal: No prevertebral fluid or swelling. No visible canal hematoma. Disc levels: Mild loss of disc height noted C7-T1. Remaining intervertebral disc spaces are preserved. Upper chest: Unremarkable. Other: None. IMPRESSION: 1. No acute intracranial abnormality. Atrophy with chronic small vessel white matter ischemic disease. 2. Nonacute parafalcine left occipital lobe infarct is new in the interval. 3. No cervical spine fracture. Electronically Signed   By: Misty Stanley M.D.   On: 11/08/2020 11:03   CT Cervical Spine Wo Contrast  Result Date: 11/08/2020 CLINICAL DATA:  Patient fell.  Found down. EXAM: CT HEAD WITHOUT CONTRAST CT CERVICAL SPINE WITHOUT CONTRAST TECHNIQUE: Multidetector CT imaging of the head and cervical spine was performed following the standard protocol without intravenous contrast. Multiplanar CT image reconstructions of the cervical spine were also generated. COMPARISON:  Head CT 06/29/2018. FINDINGS: CT HEAD FINDINGS Brain: There is no evidence for acute hemorrhage, hydrocephalus, mass lesion, or abnormal extra-axial fluid collection. No definite CT evidence for acute infarction. Diffuse loss of parenchymal volume is consistent with atrophy. Patchy low attenuation in the deep hemispheric and periventricular white matter is nonspecific, but likely reflects chronic microvascular ischemic demyelination. Nonacute parafalcine left occipital lobe infarct is new in the interval. Vascular: No hyperdense vessel or unexpected calcification. Skull: No evidence for fracture. No worrisome lytic or sclerotic lesion. Sinuses/Orbits: The visualized paranasal sinuses and mastoid air cells are clear.  Visualized portions of the intraorbital fat are unremarkable. Surgical changes noted left globe. Other: None. CT CERVICAL SPINE FINDINGS Alignment: Normal. Skull base and vertebrae: No acute fracture. No primary bone lesion or focal pathologic process. Soft tissues and spinal canal: No prevertebral fluid or swelling. No visible canal hematoma. Disc levels: Mild loss of disc height noted C7-T1. Remaining intervertebral disc spaces are preserved. Upper chest: Unremarkable. Other: None. IMPRESSION: 1. No acute intracranial abnormality. Atrophy with chronic small  vessel white matter ischemic disease. 2. Nonacute parafalcine left occipital lobe infarct is new in the interval. 3. No cervical spine fracture. Electronically Signed   By: Misty Stanley M.D.   On: 11/08/2020 11:03   DG Chest Portable 1 View  Result Date: 11/08/2020 CLINICAL DATA:  Fall.  Evaluate for infiltrate. EXAM: PORTABLE CHEST 1 VIEW COMPARISON:  09/21/2020 FINDINGS: 1033 hours. Low lung volumes with stable asymmetric elevation right hemidiaphragm. Cardiopericardial silhouette is at upper limits of normal for size. Status post CABG with aortic valve replacement. There is pulmonary vascular congestion without overt pulmonary edema. No focal consolidation or pleural effusion. Telemetry leads overlie the chest. IMPRESSION: Low volume chest x-ray with pulmonary vascular congestion. Electronically Signed   By: Misty Stanley M.D.   On: 11/08/2020 10:57    Labs on Admission: I have personally reviewed following labs  CBC: Recent Labs  Lab 11/08/20 0920  WBC 4.6  HGB 11.9*  HCT 33.5*  MCV 87.9  PLT 606*   Basic Metabolic Panel: Recent Labs  Lab 11/08/20 0920  NA 126*  K 4.1  CL 92*  CO2 21*  GLUCOSE 488*  BUN 16  CREATININE 1.28*  CALCIUM 8.9   GFR: Estimated Creatinine Clearance: 58.7 mL/min (A) (by C-G formula based on SCr of 1.28 mg/dL (H)).  Liver Function Tests: Recent Labs  Lab 11/08/20 0920  AST 32  ALT 34  ALKPHOS  67  BILITOT 1.9*  PROT 6.9  ALBUMIN 3.4*   Recent Labs  Lab 11/08/20 1021  AMMONIA <9*   Cardiac Enzymes: Recent Labs  Lab 11/08/20 0920  CKTOTAL 69   CBG: Recent Labs  Lab 11/08/20 0919  GLUCAP 461*   Urine analysis:    Component Value Date/Time   COLORURINE YELLOW (A) 11/08/2020 0920   APPEARANCEUR CLEAR (A) 11/08/2020 0920   APPEARANCEUR Turbid (A) 04/14/2020 1600   LABSPEC 1.026 11/08/2020 0920   PHURINE 5.0 11/08/2020 0920   GLUCOSEU >=500 (A) 11/08/2020 0920   GLUCOSEU NEG mg/dL 05/18/2006 0000   HGBUR SMALL (A) 11/08/2020 0920   BILIRUBINUR NEGATIVE 11/08/2020 0920   BILIRUBINUR Negative 04/14/2020 1600   KETONESUR 20 (A) 11/08/2020 0920   PROTEINUR NEGATIVE 11/08/2020 0920   UROBILINOGEN 0.2 01/10/2017 1557   UROBILINOGEN 1.0 07/24/2009 1756   NITRITE NEGATIVE 11/08/2020 0920   LEUKOCYTESUR TRACE (A) 11/08/2020 0920   Dr. Tobie Poet [facility]  If 7PM-7AM, please contact overnight-coverage provider If 7AM-7PM, please contact day coverage provider [email or web address]  11/08/2020, 12:49 PM

## 2020-11-08 NOTE — Progress Notes (Signed)
PT Cancellation Note  Patient Details Name: Mark Cabrera MRN: [id number] DOB: February 05, 1951   Cancelled Treatment:    Reason Eval/Treat Not Completed: Medical issues which prohibited therapy: Pt's BG 559 falling outside guidelines for participation with PT services.  Will attempt to see pt at a future date/time as medically appropriate.     Linus Salmons PT, DPT 11/08/20, 3:37 PM

## 2020-11-08 NOTE — ED Notes (Signed)
CBG 461 '@0921'$ , RN made aware.

## 2020-11-08 NOTE — ED Notes (Signed)
Patient transported to MRI 

## 2020-11-08 NOTE — ED Notes (Signed)
Patient pulled up in bed by this RN and annie RN

## 2020-11-08 NOTE — ED Provider Notes (Signed)
[facility] Emergency Department Provider Note    Event Date/Time   First MD Initiated Contact with Patient 11/08/20 [phone]     (approximate)  I have reviewed the triage vital signs and the nursing notes.   HISTORY  Chief Complaint Fall    HPI Aidyn Calkin Saint is a 70 y.o. male below listed past medical history presents to the ER for evaluation of weakness and frequent falls that he states been ongoing over the past several months but this morning had another fall where he just slid out of bed and was unable to get up for several hours.  Is an elderly woman lives at home with him that is unable to help him get up therefore EMS was called.  Denies any focal pain.  States his blood sugars have been running high.  Past Medical History:  Diagnosis Date   Acute diastolic congestive heart failure ([place])    Aortic valve stenosis s/p AVR 2018   Echo 2/22: Poor acoustic windows, EF 50-55, mild LVH, normal RVSF, mild MR, trivial AI, no AS, no pericardial effusion   Atrial fibrillation (HCC) - post-op CABG    04/2016 CHA2DS2VAS score = 5   Atypical nevi    Coronary artery disease s/p 2 vessel CABG    Depression    "years ago"   Diabetes mellitus    Dyspnea    in the past    GERD (gastroesophageal reflux disease)    Heart murmur    Hepatic cirrhosis ([place]) 06/29/2017   History of kidney stones    Hyperlipidemia    hx of transaminitis secondary to statin and he has decided not to use statins secondary to potential side effects.   Hypertension    Nephrolithiasis    Osteoarthritis, knee    Sleep apnea     Central apnea. 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 MRI of the brain 2007 showed acute left lateral medullary infarct in the distribution of left posterior inferior cerebral artery , narrowing of the left vertebral with  severe diminution of flow or acute occlusion. 2-D echo was normal no embolic source found.   Family History  Problem Relation Age of Onset   Hypertension Mother    Diabetes Mother    Alcohol abuse Father    Past Surgical History:  Procedure Laterality Date   AORTIC VALVE REPLACEMENT N/A 03/30/2016   Procedure: AORTIC VALVE REPLACEMENT (AVR);  Surgeon: [name], MD;  Location: [place];  Service: Open Heart Surgery;  Laterality: N/A;   CARDIAC CATHETERIZATION N/A 03/15/2016   Procedure: Right/Left Heart Cath and Coronary Angiography;  Surgeon: Burnell Blanks, MD;  Location: [place] [facility];  Service: Cardiovascular;  Laterality: N/A;   CLIPPING OF ATRIAL APPENDAGE N/A 03/30/2016   Procedure: CLIPPING OF LEFT ATRIAL APPENDAGE;  Surgeon: [name], MD;  Location: [place];  Service: Open Heart Surgery;  Laterality: N/A;   CORONARY ARTERY BYPASS GRAFT N/A 03/30/2016   Procedure: CORONARY ARTERY BYPASS GRAFTING (CABG) Times Two;  Surgeon: [name], MD;  Location: [place];  Service: Open Heart Surgery;  Laterality: N/A;   KNEE ARTHROSCOPY W/ PARTIAL MEDIAL MENISCECTOMY  05/12/2005   right, performed by Dr. French Ana for torn medial meniscus.   ROTATOR CUFF REPAIR Right 2016   STERNAL WIRES REMOVAL N/A 08/13/2017  Procedure: STERNAL WIRES REMOVAL;  Surgeon: [name], MD;  Location: [place];  Service: Thoracic;  Laterality: N/A;   SUBXYPHOID PERICARDIAL WINDOW N/A 04/21/2016   Procedure: SUBXYPHOID PERICARDIAL WINDOW;  Surgeon: [name], MD;  Location: [place];  Service: Thoracic;  Laterality: N/A;   TEE WITHOUT CARDIOVERSION N/A 03/30/2016   Procedure: TRANSESOPHAGEAL ECHOCARDIOGRAM (TEE);  Surgeon: [name], MD;  Location: [place];  Service: Open Heart Surgery;  Laterality: N/A;   TEE WITHOUT CARDIOVERSION N/A 04/21/2016   Procedure: TRANSESOPHAGEAL ECHOCARDIOGRAM (TEE);  Surgeon: [name], MD;  Location: [place];  Service:  Thoracic;  Laterality: N/A;   Patient Active Problem List   Diagnosis Date Noted   Left shoulder pain 10/20/2020   Dyspnea 09/23/2020   Mild cognitive impairment 08/25/2020   Hip pain, chronic, left 07/05/2020   Epididymitis 04/14/2020   Fatigue 03/18/2020   Abdominal wall bulge 12/03/2019   Dermoid inclusion cyst 08/07/2019   Chronic left shoulder pain 05/13/2019   Depression 11/05/2018   Blurry vision, bilateral 09/20/2018   CVA (cerebral vascular accident) ([place]) 06/28/2018   Hepatic cirrhosis ([place]) 06/29/2017   Healthcare maintenance 12/16/2016   Lipoma of left upper extremity 09/12/2016   Chronic knee pain 07/04/2016   Decreased visual acuity 07/04/2016   Peripheral neuropathic pain 05/12/2016   Pericardial effusion a. 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 Authorizing Provider  acetaminophen (TYLENOL) 325 MG tablet Take 2 tablets (650 mg total) by mouth every 4 (four) hours as needed for headache or mild pain. 04/28/16   Isaiah Serge, NP  apixaban (ELIQUIS) 5 MG TABS tablet TAKE 1 TABLET (5 MG TOTAL) BY MOUTH 2 (TWO) TIMES DAILY. 10/20/20   Riesa Pope, MD  Bayer Microlet Lancets lancets Check blood sugar 3 times a day as instructed 05/26/19   Ladona Horns, MD  Bempedoic Acid-Ezetimibe (NEXLIZET) 180-10 MG TABS Take 1 tablet by mouth daily. 03/10/20   Mosetta Anis, MD  furosemide (LASIX) 40 MG tablet Take 1 tablet (40 mg total) by mouth daily. 10/20/20 10/20/21  Riesa Pope, MD  glucose blood (CONTOUR NEXT TEST) test strip Check blood sugar 3 times a day as instructed 10/20/20   Katsadouros, Vasilios, MD  glucose blood test strip USE 1 TO CHECK BLOOD GLUCOSE THREE TIMES DAILY AS DIRECTED. 03/15/20 03/15/21  Mosetta Anis, MD  insulin aspart (NOVOLOG FLEXPEN) 100 UNIT/ML FlexPen INJECT 18 UNITS UNDER THE SKIN THREE TIMES DAILY WITH MEALS 10/20/20   Katsadouros, Vasilios, MD  Insulin Pen Needle 31G X 5 MM MISC Use one pen needle three times daily. Dx E11.49 10/20/20   Riesa Pope, MD  isosorbide mononitrate (IMDUR) 30 MG 24 hr tablet Take 1 tablet (30 mg total) by mouth daily. 10/20/20   Katsadouros, Vasilios, MD  lisinopril (ZESTRIL) 10 MG tablet Take 1 tablet (10 mg total) by mouth daily. 10/20/20  Riesa Pope, MD  metFORMIN (GLUCOPHAGE) 500 MG tablet Take 2 tablets (1,000 mg total) by mouth 2 (two) times daily with a meal. 10/20/20   Katsadouros, Vasilios, MD  nitroGLYCERIN (NITROSTAT) 0.4 MG SL tablet Place 1 tablet (0.4 mg total) under the tongue every 5 (five) minutes as needed for chest pain. Patient not taking: Reported on 10/20/2020 06/21/20   Burnell Blanks, MD  REPATHA SURECLICK [id number] MG/ML SOAJ INJECT 1 PEN INTO THE SKIN EVERY 14 (FOURTEEN) DAYS. 06/21/20   Burnell Blanks, MD  SOLIQUA 100-33 UNT-MCG/ML SOPN Inject 40 Units into the skin daily. 10/20/20   Riesa Pope, MD    Allergies Morpholine salicylate, Penicillins, Morphine and related, Sglt2 inhibitors, and Statins    Social History Social History   Tobacco Use   Smoking status: Never   Smokeless tobacco: Never  Vaping Use   Vaping Use: Never used  Substance Use Topics   Alcohol use: Yes    Alcohol/week: 1.0 standard drink    Types: 1 Cans of beer per week    Comment: occasional   Drug use: No    Review of Systems Patient denies  headaches, rhinorrhea, blurry vision, numbness, shortness of breath, chest pain, edema, cough, abdominal pain, nausea, vomiting, diarrhea, dysuria, fevers, rashes or hallucinations unless otherwise stated above in HPI. ____________________________________________   PHYSICAL EXAM:  VITAL SIGNS: Vitals:   11/08/20 1000 11/08/20 1130  BP: 118/64 (!) 110/52  Pulse: 96 91  Resp: 17 16  Temp:    SpO2: 99% 99%    Constitutional: Alert and oriented. Disheveled appearing Eyes: Conjunctivae are normal.  Head: Atraumatic. Nose: No congestion/rhinnorhea. Mouth/Throat: Mucous membranes are moist.   Neck: No stridor. Painless ROM.  Cardiovascular: Normal rate, regular rhythm. Grossly normal heart sounds.  Good peripheral circulation. Respiratory: Normal respiratory effort.  No retractions. Lungs CTAB. Gastrointestinal: Soft and nontender. No distention. No abdominal bruits. No CVA tenderness. Genitourinary:  Musculoskeletal: No lower extremity tenderness nor edema.  No joint effusions. Neurologic:  Normal speech and language. No gross focal neurologic deficits are appreciated. No facial droop Skin:  Skin is warm, dry and intact. No rash noted. Psychiatric: Mood and affect are normal. Speech and behavior are normal.  ____________________________________________   LABS (all labs ordered are listed, but only abnormal results are displayed)  Results for orders placed or performed during the hospital encounter of 11/08/20 (from the past 24 hour(s))  CBG monitoring, ED     Status: Abnormal   Collection Time: 11/08/20  9:19 AM  Result Value Ref Range   Glucose-Capillary 461 (H) 70 - 99 mg/dL  CBC     Status: Abnormal   Collection Time: 11/08/20  9:20 AM  Result Value Ref Range   WBC 4.6 4.0 - 10.5 K/uL   RBC 3.81 (L) 4.22 - 5.81 MIL/uL   Hemoglobin 11.9 (L) 13.0 - 17.0 g/dL   HCT 33.5 (L) 39.0 - 52.0 %   MCV 87.9 80.0 - 100.0 fL   MCH 31.2 26.0 - 34.0 pg   MCHC 35.5 30.0 - 36.0 g/dL    RDW 12.9 11.5 - 15.5 %   Platelets 108 (L) 150 - 400 K/uL   nRBC 0.0 0.0 - 0.2 %  Comprehensive metabolic panel     Status: Abnormal   Collection Time: 11/08/20  9:20 AM  Result Value Ref Range   Sodium 126 (L) 135 - 145 mmol/L   Potassium 4.1 3.5 - 5.1 mmol/L   Chloride 92 (L) 98 - 111 mmol/L  CO2 21 (L) 22 - 32 mmol/L   Glucose, Bld 488 (H) 70 - 99 mg/dL   BUN 16 8 - 23 mg/dL   Creatinine, Ser 1.28 (H) 0.61 - 1.24 mg/dL   Calcium 8.9 8.9 - 10.3 mg/dL   Total Protein 6.9 6.5 - 8.1 g/dL   Albumin 3.4 (L) 3.5 - 5.0 g/dL   AST 32 15 - 41 U/L   ALT 34 0 - 44 U/L   Alkaline Phosphatase 67 38 - 126 U/L   Total Bilirubin 1.9 (H) 0.3 - 1.2 mg/dL   GFR, Estimated 59 (L) >60 mL/min   Anion gap 13 5 - 15  CK     Status: None   Collection Time: 11/08/20  9:20 AM  Result Value Ref Range   Total CK 69 49 - 397 U/L  Troponin I (High Sensitivity)     Status: Abnormal   Collection Time: 11/08/20  9:20 AM  Result Value Ref Range   Troponin I (High Sensitivity) 52 (H) <18 ng/L  Urinalysis, Complete w Microscopic     Status: Abnormal   Collection Time: 11/08/20  9:20 AM  Result Value Ref Range   Color, Urine YELLOW (A) YELLOW   APPearance CLEAR (A) CLEAR   Specific Gravity, Urine 1.026 1.005 - 1.030   pH 5.0 5.0 - 8.0   Glucose, UA >=500 (A) NEGATIVE mg/dL   Hgb urine dipstick SMALL (A) NEGATIVE   Bilirubin Urine NEGATIVE NEGATIVE   Ketones, ur 20 (A) NEGATIVE mg/dL   Protein, ur NEGATIVE NEGATIVE mg/dL   Nitrite NEGATIVE NEGATIVE   Leukocytes,Ua TRACE (A) NEGATIVE   RBC / HPF 0-5 0 - 5 RBC/hpf   WBC, UA 21-50 0 - 5 WBC/hpf   Bacteria, UA FEW (A) NONE SEEN   Squamous Epithelial / LPF 0-5 0 - 5   Mucus PRESENT   Brain natriuretic peptide     Status: Abnormal   Collection Time: 11/08/20  9:20 AM  Result Value Ref Range   B Natriuretic Peptide 124.5 (H) 0.0 - 100.0 pg/mL  Ammonia     Status: Abnormal   Collection Time: 11/08/20 10:21 AM  Result Value Ref Range   Ammonia <9 (L) 9 -  35 umol/L   ____________________________________________  EKG My review and personal interpretation at Time: 9:17   Indication: weakness  Rate: 105  Rhythm: sinus Axis: left Other: ivcd, onspecific st abn ____________________________________________  RADIOLOGY  I personally reviewed all radiographic images ordered to evaluate for the above acute complaints and reviewed radiology reports and findings.  These findings were personally discussed with the patient.  Please see medical record for radiology report.  ____________________________________________   PROCEDURES  Procedure(s) performed:  Procedures    Critical Care performed: no ____________________________________________   INITIAL IMPRESSION / ASSESSMENT AND PLAN / ED COURSE  Pertinent labs & imaging results that were available during my care of the patient were reviewed by me and considered in my medical decision making (see chart for details).   DDX: Dehydration, sepsis, pna, uti, hypoglycemia, cva, drug effect, withdrawal, encephalitis   REINALD TORELLO is a 70 y.o. who presents to the ED with presentation as described above.  Patient frail elderly disheveled appearing.  No focal pain.  X-rays and blood work sent for by differential.  Patient denying any chest pain or pressure at this time.  Troponin mildly elevated but also has some findings of CHF more likely consistent with mild CHF is not hypoxic.  His abdominal exam is  soft and benign.  Clinical Course as of 11/08/20 1151  Mon Nov 08, 2020  1148 CT head with interval stroke though likely subacute.  Blood work otherwise reassuring with findings of hyperglycemia but no evidence of DKA.  Urinalysis is consistent with UTI patient endorsing increasing frequency and urgency.  Will give IV Rocephin.  Will order MRI given concern for recent fall with possible stroke with his history of CVAs.  Based on his age frailty factors with frequent falls and generalized weakness will  discuss with hospitalist for observation further medical management. [PR]    Clinical Course User Index [PR] Merlyn Lot, MD    The patient was evaluated in Emergency Department today for the symptoms described in the history of present illness. He/she was evaluated in the context of the global COVID-19 pandemic, which necessitated consideration that the patient might be at risk for infection with the SARS-CoV-2 virus that causes COVID-19. Institutional protocols and algorithms that pertain to the evaluation of patients at risk for COVID-19 are in a state of rapid change based on information released by regulatory bodies including the CDC and federal and state organizations. These policies and algorithms were followed during the patient's care in the ED.  As part of my medical decision making, I reviewed the following data within the [facility] notes reviewed and incorporated, Labs reviewed, notes from prior ED visits and [place] Controlled Substance Database   ____________________________________________   FINAL CLINICAL IMPRESSION(S) / ED DIAGNOSES  Final diagnoses:  Weakness  Acute cystitis without hematuria  Hyperglycemia      NEW MEDICATIONS STARTED DURING THIS VISIT:  New Prescriptions   No medications on file     Note:  This document was prepared using Dragon voice recognition software and may include unintentional dictation errors.    Merlyn Lot, MD 11/08/20 [id number]

## 2020-11-08 NOTE — ED Notes (Signed)
Dr. Tobie Poet notified of BGL

## 2020-11-09 DIAGNOSIS — J189 Pneumonia, unspecified organism: Secondary | ICD-10-CM | POA: Diagnosis not present

## 2020-11-09 DIAGNOSIS — E785 Hyperlipidemia, unspecified: Secondary | ICD-10-CM | POA: Diagnosis present

## 2020-11-09 DIAGNOSIS — D6959 Other secondary thrombocytopenia: Secondary | ICD-10-CM | POA: Diagnosis present

## 2020-11-09 DIAGNOSIS — Z20822 Contact with and (suspected) exposure to covid-19: Secondary | ICD-10-CM | POA: Diagnosis present

## 2020-11-09 DIAGNOSIS — G4733 Obstructive sleep apnea (adult) (pediatric): Secondary | ICD-10-CM | POA: Diagnosis present

## 2020-11-09 DIAGNOSIS — H547 Unspecified visual loss: Secondary | ICD-10-CM | POA: Diagnosis present

## 2020-11-09 DIAGNOSIS — R1032 Left lower quadrant pain: Secondary | ICD-10-CM | POA: Diagnosis not present

## 2020-11-09 DIAGNOSIS — I480 Paroxysmal atrial fibrillation: Secondary | ICD-10-CM | POA: Diagnosis present

## 2020-11-09 DIAGNOSIS — D638 Anemia in other chronic diseases classified elsewhere: Secondary | ICD-10-CM | POA: Diagnosis present

## 2020-11-09 DIAGNOSIS — D72819 Decreased white blood cell count, unspecified: Secondary | ICD-10-CM | POA: Diagnosis present

## 2020-11-09 DIAGNOSIS — Z952 Presence of prosthetic heart valve: Secondary | ICD-10-CM | POA: Diagnosis not present

## 2020-11-09 DIAGNOSIS — G3184 Mild cognitive impairment, so stated: Secondary | ICD-10-CM | POA: Diagnosis present

## 2020-11-09 DIAGNOSIS — K7460 Unspecified cirrhosis of liver: Secondary | ICD-10-CM | POA: Diagnosis present

## 2020-11-09 DIAGNOSIS — D696 Thrombocytopenia, unspecified: Secondary | ICD-10-CM | POA: Diagnosis not present

## 2020-11-09 DIAGNOSIS — I5032 Chronic diastolic (congestive) heart failure: Secondary | ICD-10-CM | POA: Diagnosis present

## 2020-11-09 DIAGNOSIS — W06XXXA Fall from bed, initial encounter: Secondary | ICD-10-CM | POA: Diagnosis present

## 2020-11-09 DIAGNOSIS — Z794 Long term (current) use of insulin: Secondary | ICD-10-CM | POA: Diagnosis not present

## 2020-11-09 DIAGNOSIS — N3001 Acute cystitis with hematuria: Secondary | ICD-10-CM | POA: Diagnosis present

## 2020-11-09 DIAGNOSIS — F32A Depression, unspecified: Secondary | ICD-10-CM | POA: Diagnosis present

## 2020-11-09 DIAGNOSIS — I110 Hypertensive heart disease with heart failure: Secondary | ICD-10-CM | POA: Diagnosis present

## 2020-11-09 DIAGNOSIS — R531 Weakness: Secondary | ICD-10-CM

## 2020-11-09 DIAGNOSIS — N179 Acute kidney failure, unspecified: Secondary | ICD-10-CM

## 2020-11-09 DIAGNOSIS — E871 Hypo-osmolality and hyponatremia: Secondary | ICD-10-CM | POA: Diagnosis present

## 2020-11-09 DIAGNOSIS — Z66 Do not resuscitate: Secondary | ICD-10-CM | POA: Diagnosis present

## 2020-11-09 DIAGNOSIS — R296 Repeated falls: Secondary | ICD-10-CM | POA: Diagnosis present

## 2020-11-09 DIAGNOSIS — I2510 Atherosclerotic heart disease of native coronary artery without angina pectoris: Secondary | ICD-10-CM | POA: Diagnosis present

## 2020-11-09 DIAGNOSIS — B9689 Other specified bacterial agents as the cause of diseases classified elsewhere: Secondary | ICD-10-CM | POA: Diagnosis present

## 2020-11-09 DIAGNOSIS — E1165 Type 2 diabetes mellitus with hyperglycemia: Secondary | ICD-10-CM | POA: Diagnosis present

## 2020-11-09 DIAGNOSIS — E876 Hypokalemia: Secondary | ICD-10-CM | POA: Diagnosis present

## 2020-11-09 DIAGNOSIS — Y92003 Bedroom of unspecified non-institutional (private) residence as the place of occurrence of the external cause: Secondary | ICD-10-CM | POA: Diagnosis not present

## 2020-11-09 LAB — CBC
HCT: 29.3 % — ABNORMAL LOW (ref 39.0–52.0)
Hemoglobin: 10.4 g/dL — ABNORMAL LOW (ref 13.0–17.0)
MCH: 31 pg (ref 26.0–34.0)
MCHC: 35.5 g/dL (ref 30.0–36.0)
MCV: 87.2 fL (ref 80.0–100.0)
Platelets: 97 10*3/uL — ABNORMAL LOW (ref 150–400)
RBC: 3.36 MIL/uL — ABNORMAL LOW (ref 4.22–5.81)
RDW: 12.8 % (ref 11.5–15.5)
WBC: 3.3 10*3/uL — ABNORMAL LOW (ref 4.0–10.5)
nRBC: 0 % (ref 0.0–0.2)

## 2020-11-09 LAB — BASIC METABOLIC PANEL
Anion gap: 11 (ref 5–15)
BUN: 18 mg/dL (ref 8–23)
CO2: 22 mmol/L (ref 22–32)
Calcium: 8.5 mg/dL — ABNORMAL LOW (ref 8.9–10.3)
Chloride: 98 mmol/L (ref 98–111)
Creatinine, Ser: 1.08 mg/dL (ref 0.61–1.24)
GFR, Estimated: >60 mL/min (ref >60)
Glucose, Bld: 286 mg/dL — ABNORMAL HIGH (ref 70–99)
Potassium: 3.4 mmol/L — ABNORMAL LOW (ref 3.5–5.1)
Sodium: 131 mmol/L — ABNORMAL LOW (ref 135–145)

## 2020-11-09 LAB — GLUCOSE, CAPILLARY
Glucose-Capillary: 278 mg/dL — ABNORMAL HIGH (ref 70–99)
Glucose-Capillary: 376 mg/dL — ABNORMAL HIGH (ref 70–99)
Glucose-Capillary: 438 mg/dL — ABNORMAL HIGH (ref 70–99)
Glucose-Capillary: 358 mg/dL — ABNORMAL HIGH (ref 70–99)
Glucose-Capillary: 279 mg/dL — ABNORMAL HIGH (ref 70–99)

## 2020-11-09 LAB — PROCALCITONIN: Procalcitonin: 5.71 ng/mL

## 2020-11-09 LAB — MAGNESIUM: Magnesium: 1.8 mg/dL (ref 1.7–2.4)

## 2020-11-09 MED ORDER — SODIUM CHLORIDE 0.9 % IV SOLN
1.0000 g | INTRAVENOUS | Status: DC
  Administered 2020-11-09 – 2020-11-10 (×2): 1 g via INTRAVENOUS
  Filled 2020-11-09: qty 10
  Filled 2020-11-09: qty 1

## 2020-11-09 MED ORDER — ALBUTEROL SULFATE (2.5 MG/3ML) 0.083% IN NEBU: 2.5000 mg | INHALATION_SOLUTION | Freq: Four times a day (QID) | RESPIRATORY_TRACT | Status: DC | PRN

## 2020-11-09 MED ORDER — SODIUM CHLORIDE 0.9 % IV SOLN
1.0000 g | INTRAVENOUS | Status: DC
  Filled 2020-11-09: qty 10

## 2020-11-09 NOTE — Progress Notes (Signed)
Met with the patient to discuss DC plan and needs He lives at home with a room mate, he would like Home health and has used [place] in the past, he lives in [place], I notified [facility] [place] [facility] [place] He has a cane and a rollator and does not need additional DME Mark Cabrera accepted the patient for [facility]

## 2020-11-09 NOTE — Evaluation (Signed)
Occupational Therapy Evaluation Patient Details Name: Mark Cabrera MRN: [id number] DOB: 24-Dec-1950 Today's Date: 11/09/2020    History of Present Illness Mark Cabrera is a 70 y.o. male with medical history significant for aortic valve stenosis status post AVR, history of acute diastolic congestive heart failure, CAD status post two-vessel CABG, depression, diabetes, GERD, hepatic cirrhosis, history of kidney stones, hyperlipidemia, hypertension, osteoarthritis, central sleep apnea, not on CPAP, history of vertebral artery dissection in 2007, history of transaminitis secondary to statins, urgency department for chief concerns of weakness and fall.   Clinical Impression   Mark Cabrera presents today with generalized weakness following a fall (rolling out of bed) at his home. He reports being Mod I at home -- no longer driving and having someone else do his grocery shopping -- but, other than that, able to complete all ADL/IADL INDly. He uses a walking stick when walking longer distances outside (e.g., 100 yards to/from his mailbox each day), does not use AD w/in his home. Reports his only fall in the previous 12 months is the above-mentioned fall from bed (he was reaching out of bed to pick up something on the floor). Did not believe he was injured in this fall, but had to call EMS because he was unable to come into standing on his own. On evaluation today, he denies pain, is A&Ox4, is able to ambulate in room w/o AD, although demonstrating moments of slight unsteadiness. Reports he feels he is at baseline level of fxl mobility and is eager to return home. No further OT services required at this time.     Follow Up Recommendations  No OT follow up    Equipment Recommendations  None recommended by OT    Recommendations for Other Services       Precautions / Restrictions Precautions Precautions: Fall Restrictions Weight Bearing Restrictions: No      Mobility Bed Mobility                General bed mobility comments: pt received, left in recliner    Transfers Overall transfer level: Needs assistance   Transfers: Sit to/from Stand Sit to Stand: Supervision         General transfer comment: SUPV for safety, no AD    Balance Overall balance assessment: Needs assistance Sitting-balance support: No upper extremity supported Sitting balance-Leahy Scale: Good     Standing balance support: No upper extremity supported Standing balance-Leahy Scale: Fair Standing balance comment: minor LOB in standing, pt able to self-correct                           ADL either performed or assessed with clinical judgement   ADL Overall ADL's : Modified independent                                             Vision         Perception     Praxis      Pertinent Vitals/Pain       Hand Dominance Right   Extremity/Trunk Assessment Upper Extremity Assessment Upper Extremity Assessment: Overall WFL for tasks assessed   Lower Extremity Assessment Lower Extremity Assessment: Overall WFL for tasks assessed       Communication Communication Communication: No difficulties   Cognition Arousal/Alertness: Awake/alert Behavior During Therapy: WFL for tasks assessed/performed Overall Cognitive  Status: No family/caregiver present to determine baseline cognitive functioning                                 General Comments: A&O x 4, but moments of confusion   General Comments       Exercises Other Exercises Other Exercises: Transfers, w/in room ambulation, self-feeding, educ re: role of OT, DC recs, fall prevention strategies   Shoulder Instructions      Home Living Family/patient expects to be discharged to:: Private residence Living Arrangements: Non-relatives/Friends Available Help at Discharge: Friend(s);Available 24 hours/day Type of Home: House       Home Layout: One level         Bathroom Toilet: Standard      Home Equipment: Environmental consultant - 2 wheels;Cane - single point (walking stick)          Prior Functioning/Environment Level of Independence: Independent with assistive device(s)        Comments: manages own ADLs. Does not drive, roommate does grocery shopping, pt does light cooking. Hires cleaning help.        OT Problem List: Decreased activity tolerance;Decreased knowledge of use of DME or AE;Decreased strength      OT Treatment/Interventions:      OT Goals(Current goals can be found in the care plan section) Acute Rehab OT Goals Patient Stated Goal: to get home OT Goal Formulation: With patient Time For Goal Achievement: 11/23/20 Potential to Achieve Goals: Good  OT Frequency:     Barriers to D/C:            Co-evaluation              AM-PAC OT "6 Clicks" Daily Activity     Outcome Measure Help from another person eating meals?: None Help from another person taking care of personal grooming?: None Help from another person toileting, which includes using toliet, bedpan, or urinal?: None Help from another person bathing (including washing, rinsing, drying)?: None Help from another person to put on and taking off regular upper body clothing?: None Help from another person to put on and taking off regular lower body clothing?: None 6 Click Score: 24   End of Session    Activity Tolerance: Patient tolerated treatment well Patient left: in chair;with call bell/phone within reach;with chair alarm set  OT Visit Diagnosis: Unsteadiness on feet (R26.81);Muscle weakness (generalized) (M62.81)                Time: [id number] OT Time Calculation (min): 14 min Charges:  OT General Charges $OT Visit: 1 Visit OT Evaluation $OT Eval Low Complexity: 1 Low OT Treatments $Self Care/Home Management : 8-22 mins Mark Lobo, PhD, MS, OTR/L 11/09/20, 9:56 AM

## 2020-11-09 NOTE — Evaluation (Signed)
Physical Therapy Evaluation Patient Details Name: Mark Cabrera MRN: [id number] DOB: 1950/09/27 Today's Date: 11/09/2020   History of Present Illness  Pt is a 70 y.o. male with medical history significant for aortic valve stenosis status post AVR, history of acute diastolic congestive heart failure, CAD status post two-vessel CABG, depression, diabetes, GERD, hepatic cirrhosis, history of kidney stones, hyperlipidemia, hypertension, osteoarthritis, central sleep apnea, not on CPAP, history of vertebral artery dissection in 2007, and history of transaminitis secondary to statins. Pt presented to the ED for chief concerns of weakness and fall. MD assessment includes weakness, PNA, pyuria, hyperglyemia, AKI, and hyponatremia.   Clinical Impression  Pt was pleasant and motivated to participate during the session. Pt demonstrated good functional strength and control during transfers but was mildly unsteady during gait without an AD with drifting left/right and occasional reaching for support. Pt has a h/o falling and stated his main physical goals was improved balance.  Pt will benefit from HHPT upon discharge to safely address deficits listed in patient problem list for decreased caregiver assistance and eventual return to PLOF.      Follow Up Recommendations Home health PT;Supervision - Intermittent    Equipment Recommendations  None recommended by PT    Recommendations for Other Services       Precautions / Restrictions Precautions Precautions: Fall Restrictions Weight Bearing Restrictions: No      Mobility  Bed Mobility Overal bed mobility: Independent             General bed mobility comments: pt received, left in recliner    Transfers Overall transfer level: Needs assistance Equipment used: None Transfers: Sit to/from Stand Sit to Stand: Supervision         General transfer comment: Good eccentric and concentric control  Ambulation/Gait Ambulation/Gait  assistance: Supervision Gait Distance (Feet): 150 Feet Assistive device: None Gait Pattern/deviations: Step-through pattern;Decreased step length - right;Decreased step length - left;Drifts right/left Gait velocity: decreased   General Gait Details: Mild drifting left/right but pt able to self-correct without physical assistance, reqched occasionally for support during gait which is baseline per pt  Stairs            Wheelchair Mobility    Modified Rankin (Stroke Patients Only)       Balance Overall balance assessment: Needs assistance;History of Falls Sitting-balance support: No upper extremity supported Sitting balance-Leahy Scale: Normal     Standing balance support: No upper extremity supported;During functional activity Standing balance-Leahy Scale: Fair Standing balance comment: Mild drifting and reaching for support during functional tasks                             Pertinent Vitals/Pain Pain Assessment: 0-10 Pain Score: 2  Pain Location: BLE posterior thigh, "hamstrings" per patient Pain Descriptors / Indicators: Sore Pain Intervention(s): Monitored during session    Home Living Family/patient expects to be discharged to:: Private residence Living Arrangements: Non-relatives/Friends Available Help at Discharge: Friend(s);Available 24 hours/day Type of Home: House Home Access: Ramped entrance     Home Layout: One level Home Equipment: Clinical cytogeneticist - 4 wheels;Cane - single point      Prior Function Level of Independence: Independent with assistive device(s)         Comments: Ind amb in the home without an AD, limited community ambulation with a SPC, 2 falls in the last year secondary to LOB, Ind with ADLs     Hand Dominance   Dominant Hand:  Right    Extremity/Trunk Assessment   Upper Extremity Assessment Upper Extremity Assessment: Overall WFL for tasks assessed    Lower Extremity Assessment Lower Extremity Assessment:  Overall WFL for tasks assessed       Communication   Communication: No difficulties  Cognition Arousal/Alertness: Awake/alert Behavior During Therapy: WFL for tasks assessed/performed Overall Cognitive Status: Within Functional Limits for tasks assessed                                 General Comments: A&O x 4, but moments of confusion      General Comments      Exercises Other Exercises Other Exercises: Transfers, w/in room ambulation, self-feeding, educ re: role of OT, DC recs, fall prevention strategies   Assessment/Plan    PT Assessment Patient needs continued PT services  PT Problem List Decreased balance       PT Treatment Interventions DME instruction;Gait training;Stair training;Functional mobility training;Therapeutic activities;Therapeutic exercise;Balance training;Patient/family education    PT Goals (Current goals can be found in the Care Plan section)  Acute Rehab PT Goals Patient Stated Goal: Improved balance PT Goal Formulation: With patient Time For Goal Achievement: 11/22/20 Potential to Achieve Goals: Good    Frequency Min 2X/week   Barriers to discharge        Co-evaluation               AM-PAC PT "6 Clicks" Mobility  Outcome Measure Help needed turning from your back to your side while in a flat bed without using bedrails?: None Help needed moving from lying on your back to sitting on the side of a flat bed without using bedrails?: None Help needed moving to and from a bed to a chair (including a wheelchair)?: A Little Help needed standing up from a chair using your arms (e.g., wheelchair or bedside chair)?: A Little Help needed to walk in hospital room?: A Little Help needed climbing 3-5 steps with a railing? : A Little 6 Click Score: 20    End of Session Equipment Utilized During Treatment: Gait belt Activity Tolerance: Patient tolerated treatment well Patient left: in bed;with call bell/phone within reach;with bed  alarm set Nurse Communication: Mobility status PT Visit Diagnosis: Unsteadiness on feet (R26.81);History of falling (Z91.81);Difficulty in walking, not elsewhere classified (R26.2)    Time: [id number] PT Time Calculation (min) (ACUTE ONLY): 36 min   Charges:   PT Evaluation $PT Eval Moderate Complexity: 1 Mod          D. Scott Shaletha Humble PT, DPT 11/09/20, 1:17 PM

## 2020-11-09 NOTE — Progress Notes (Signed)
PROGRESS NOTE   HPI was taken from Dr. Tobie Poet: Mark Cabrera is a 70 y.o. male with medical history significant for aortic valve stenosis status post AVR, history of acute diastolic congestive heart failure, CAD status post two-vessel CABG, depression, diabetes, GERD, hepatic cirrhosis, history of kidney stones, hyperlipidemia, hypertension, osteoarthritis, central sleep apnea, not on CPAP, history of vertebral artery dissection in 2007, history of transaminitis secondary to statins, urgency department for chief concerns of weakness and fall.   At bedside, he was finishing his lunch, told me his full name, age, location of [place], and current year of 2022 and month of August.    He states that he was laying in bed on his right side and had to urinate, he turned over to pick up bucket at bedside and he slipped out of bed from that position.  He called out to his older roommate for help.  The older roommate in the house could not help to get him up.  EMS was called.   At bedside, he was able to move his hips and knees, in flexion and extension without difficulty.  He has not had many of his medications in the last several weeks.   He reports no changes to diet. He drinks about 4 glasses of 16oz, 1 sodas, about 1/2 qt of carrot or juice per day (1 qt in two days), milk, occasional tea and coffee.    He endorses shortness of breath when he lays flat night.  He does not wear his CPAP.  He states he knows he was diagnosed with sleep apnea however he does not wear his CPAP for at least 10 years.     DACION FAULK  [id number] DOB: 1950/10/12 DOA: 11/08/2020 PCP: [name], MD    Assessment & Plan:   Principal Problem:   Weakness Active Problems:   Dyslipidemia   Hypertensive heart disease   History of dissection of vertebral artery  (HCC)   Sleep apnea   Chronic diastolic heart failure (HCC)   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)   CAD (coronary artery disease), native  coronary artery   S/P CABG x 2   Paroxysmal atrial fibrillation (HCC)   Decreased visual acuity   Lipoma of left upper extremity   Hepatic cirrhosis (HCC)   Depression   Fatigue   Mild cognitive impairment   Hyponatremia   Generalized weakness: etiology likely multifactorial including urinary tract infection, poor nutrition, medication noncompliance, OSA.  CAP: continue on IV  azithromycin, rocephin. Continue on bronchodilators and encourage incentive spirometry. Pro-cal 5.71  DM2:poorly controlled in setting of medication noncompliance. Hold home dose of metformin. Continue on glargine, SSI w/ accuchecks   Thrombocytopenia: etiology unclear. Will continue to monitor   Normocytic anemia: H&H are labile. No indication for a transfusion currently   Pyuria: present on admission. No dysuria. Urine cx pending   Hyperglycemic hyponatremia: trending up from day prior. Will continue to monitor  AKI: likely prerenal. Cr is trending down from day prior   Hypomagnesemia: WNL today    HTN: continue home dose of imdur   Depression: severity unknown. Continue home dose on lexapro   CAD: s/p CABG x2. Continue on imdur. Does not take statin medication due to transaminitis and muscle aches   S/p AVR: continue on home dose of eliquis    OSA: CPAP qhs   Right sided anterior lateral lower extremity ecchymosis/excoriation: present on admission. Secondary to fall. Continue w/ supportive care  DVT prophylaxis: (eliquis  Code Status: full  Family Communication: Disposition Plan: likely d/c back home   Level of care: Med-Surg  Status is: Inpatient  Remains inpatient appropriate because:IV treatments appropriate due to intensity of illness or inability to take PO and Inpatient level of care appropriate due to severity of illness  Dispo: The patient is from: Home              Anticipated d/c is to: Home              Patient currently is not medically stable to d/c.   Difficult to  place patient : unclear    Consultants:    Procedures:  Antimicrobials: azithromycin, rocephin    Subjective: Pt c/o weakness  Objective: Vitals:   11/08/20 2100 11/08/20 2103 11/08/20 2214 11/09/20 0514  BP:  128/65 128/68 (!) 130/59  Pulse: 90 87 87 72  Resp:  '20 17 18  '$ Temp:  98.5 F (36.9 C) 98.4 F (36.9 C) 98.4 F (36.9 C)  TempSrc:  Oral    SpO2: 99% 99% 99% 96%  Weight:      Height:        Intake/Output Summary (Last 24 hours) at 11/09/2020 0744 Last data filed at 11/09/2020 0700 Gross per 24 hour  Intake 1794.51 ml  Output 1000 ml  Net 794.51 ml   Filed Weights   11/08/20 0922  Weight: 96.5 kg    Examination:  General exam: Appears calm and comfortable  Respiratory system: diminished breath sounds b/l  Cardiovascular system: S1 & S2 +. No rubs, gallops or clicks.  Gastrointestinal system: Abdomen is nondistended, soft and nontender. Normal bowel sounds heard. Central nervous system: Alert and oriented. Moves all extremities  Psychiatry: Judgement and insight appear normal. Mood & affect appropriate.     Data Reviewed: I have personally reviewed following labs and imaging studies  CBC: Recent Labs  Lab 11/08/20 0920 11/09/20 0433  WBC 4.6 3.3*  HGB 11.9* 10.4*  HCT 33.5* 29.3*  MCV 87.9 87.2  PLT 108* 97*   Basic Metabolic Panel: Recent Labs  Lab 11/08/20 0920 11/09/20 0433  NA 126* 131*  K 4.1 3.4*  CL 92* 98  CO2 21* 22  GLUCOSE 488* 286*  BUN 16 18  CREATININE 1.28* 1.08  CALCIUM 8.9 8.5*  MG 1.5* 1.8   GFR: Estimated Creatinine Clearance: 69.6 mL/min (by C-G formula based on SCr of 1.08 mg/dL). Liver Function Tests: Recent Labs  Lab 11/08/20 0920  AST 32  ALT 34  ALKPHOS 67  BILITOT 1.9*  PROT 6.9  ALBUMIN 3.4*   No results for input(s): LIPASE, AMYLASE in the last 168 hours. Recent Labs  Lab 11/08/20 1021  AMMONIA <9*   Coagulation Profile: No results for input(s): INR, PROTIME in the last 168  hours. Cardiac Enzymes: Recent Labs  Lab 11/08/20 0920  CKTOTAL 69   BNP (last 3 results) No results for input(s): PROBNP in the last 8760 hours. HbA1C: Recent Labs    11/08/20 1356  HGBA1C 11.4*   CBG: Recent Labs  Lab 11/08/20 0919 11/08/20 1500 11/08/20 1726 11/08/20 1820 11/08/20 2051  GLUCAP 461* 559* 434* 423* 328*   Lipid Profile: No results for input(s): CHOL, HDL, LDLCALC, TRIG, CHOLHDL, LDLDIRECT in the last 72 hours. Thyroid Function Tests: Recent Labs    11/08/20 0920  TSH 2.004   Anemia Panel: Recent Labs    11/08/20 0920  VITAMINB12 428   Sepsis Labs: Recent Labs  Lab 11/08/20  0920 11/09/20 0433  PROCALCITON 4.45 5.71    Recent Results (from the past 240 hour(s))  Resp Panel by RT-PCR (Flu A&B, Covid) Nasopharyngeal Swab     Status: None   Collection Time: 11/08/20 10:21 AM   Specimen: Nasopharyngeal Swab; Nasopharyngeal(NP) swabs in vial transport medium  Result Value Ref Range Status   SARS Coronavirus 2 by RT PCR NEGATIVE NEGATIVE Final    Comment: (NOTE) SARS-CoV-2 target nucleic acids are NOT DETECTED.  The SARS-CoV-2 RNA is generally detectable in upper respiratory specimens during the acute phase of infection. The lowest concentration of SARS-CoV-2 viral copies this assay can detect is 138 copies/mL. A negative result does not preclude SARS-Cov-2 infection and should not be used as the sole basis for treatment or other patient management decisions. A negative result may occur with  improper specimen collection/handling, submission of specimen other than nasopharyngeal swab, presence of viral mutation(s) within the areas targeted by this assay, and inadequate number of viral copies(<138 copies/mL). A negative result must be combined with clinical observations, patient history, and epidemiological information. The expected result is Negative.  Fact Sheet for Patients:  EntrepreneurPulse.com.au  Fact Sheet for  Healthcare Providers:  IncredibleEmployment.be  This test is no t yet approved or cleared by the [place] FDA and  has been authorized for detection and/or diagnosis of SARS-CoV-2 by FDA under an Emergency Use Authorization (EUA). This EUA will remain  in effect (meaning this test can be used) for the duration of the COVID-19 declaration under Section 564(b)(1) of the Act, 21 U.S.C.section 360bbb-3(b)(1), unless the authorization is terminated  or revoked sooner.       Influenza A by PCR NEGATIVE NEGATIVE Final   Influenza B by PCR NEGATIVE NEGATIVE Final    Comment: (NOTE) The Xpert Xpress SARS-CoV-2/FLU/RSV plus assay is intended as an aid in the diagnosis of influenza from Nasopharyngeal swab specimens and should not be used as a sole basis for treatment. Nasal washings and aspirates are unacceptable for Xpert Xpress SARS-CoV-2/FLU/RSV testing.  Fact Sheet for Patients: EntrepreneurPulse.com.au  Fact Sheet for Healthcare Providers: IncredibleEmployment.be  This test is not yet approved or cleared by the [place] FDA and has been authorized for detection and/or diagnosis of SARS-CoV-2 by FDA under an Emergency Use Authorization (EUA). This EUA will remain in effect (meaning this test can be used) for the duration of the COVID-19 declaration under Section 564(b)(1) of the Act, 21 U.S.C. section 360bbb-3(b)(1), unless the authorization is terminated or revoked.  Performed at [facility], [address], [place], [place] [address]          Radiology Studies: CT HEAD WO CONTRAST (5MM)  Result Date: 11/08/2020 CLINICAL DATA:  Patient fell.  Found down. EXAM: CT HEAD WITHOUT CONTRAST CT CERVICAL SPINE WITHOUT CONTRAST TECHNIQUE: Multidetector CT imaging of the head and cervical spine was performed following the standard protocol without intravenous contrast. Multiplanar CT image reconstructions of  the cervical spine were also generated. COMPARISON:  Head CT 06/29/2018. FINDINGS: CT HEAD FINDINGS Brain: There is no evidence for acute hemorrhage, hydrocephalus, mass lesion, or abnormal extra-axial fluid collection. No definite CT evidence for acute infarction. Diffuse loss of parenchymal volume is consistent with atrophy. Patchy low attenuation in the deep hemispheric and periventricular white matter is nonspecific, but likely reflects chronic microvascular ischemic demyelination. Nonacute parafalcine left occipital lobe infarct is new in the interval. Vascular: No hyperdense vessel or unexpected calcification. Skull: No evidence for fracture. No worrisome lytic or sclerotic lesion. Sinuses/Orbits:  The visualized paranasal sinuses and mastoid air cells are clear. Visualized portions of the intraorbital fat are unremarkable. Surgical changes noted left globe. Other: None. CT CERVICAL SPINE FINDINGS Alignment: Normal. Skull base and vertebrae: No acute fracture. No primary bone lesion or focal pathologic process. Soft tissues and spinal canal: No prevertebral fluid or swelling. No visible canal hematoma. Disc levels: Mild loss of disc height noted C7-T1. Remaining intervertebral disc spaces are preserved. Upper chest: Unremarkable. Other: None. IMPRESSION: 1. No acute intracranial abnormality. Atrophy with chronic small vessel white matter ischemic disease. 2. Nonacute parafalcine left occipital lobe infarct is new in the interval. 3. No cervical spine fracture. Electronically Signed   By: Misty Stanley M.D.   On: 11/08/2020 11:03   CT Cervical Spine Wo Contrast  Result Date: 11/08/2020 CLINICAL DATA:  Patient fell.  Found down. EXAM: CT HEAD WITHOUT CONTRAST CT CERVICAL SPINE WITHOUT CONTRAST TECHNIQUE: Multidetector CT imaging of the head and cervical spine was performed following the standard protocol without intravenous contrast. Multiplanar CT image reconstructions of the cervical spine were also  generated. COMPARISON:  Head CT 06/29/2018. FINDINGS: CT HEAD FINDINGS Brain: There is no evidence for acute hemorrhage, hydrocephalus, mass lesion, or abnormal extra-axial fluid collection. No definite CT evidence for acute infarction. Diffuse loss of parenchymal volume is consistent with atrophy. Patchy low attenuation in the deep hemispheric and periventricular white matter is nonspecific, but likely reflects chronic microvascular ischemic demyelination. Nonacute parafalcine left occipital lobe infarct is new in the interval. Vascular: No hyperdense vessel or unexpected calcification. Skull: No evidence for fracture. No worrisome lytic or sclerotic lesion. Sinuses/Orbits: The visualized paranasal sinuses and mastoid air cells are clear. Visualized portions of the intraorbital fat are unremarkable. Surgical changes noted left globe. Other: None. CT CERVICAL SPINE FINDINGS Alignment: Normal. Skull base and vertebrae: No acute fracture. No primary bone lesion or focal pathologic process. Soft tissues and spinal canal: No prevertebral fluid or swelling. No visible canal hematoma. Disc levels: Mild loss of disc height noted C7-T1. Remaining intervertebral disc spaces are preserved. Upper chest: Unremarkable. Other: None. IMPRESSION: 1. No acute intracranial abnormality. Atrophy with chronic small vessel white matter ischemic disease. 2. Nonacute parafalcine left occipital lobe infarct is new in the interval. 3. No cervical spine fracture. Electronically Signed   By: Misty Stanley M.D.   On: 11/08/2020 11:03   MR BRAIN WO CONTRAST  Result Date: 11/08/2020 CLINICAL DATA:  Neuro deficit, acute, stroke suspected EXAM: MRI HEAD WITHOUT CONTRAST TECHNIQUE: Multiplanar, multiecho pulse sequences of the brain and surrounding structures were obtained without intravenous contrast. COMPARISON:  Same day CT head.  MRI head June 28, 2018. FINDINGS: Brain: No acute infarction, hemorrhage, hydrocephalus, extra-axial collection  or mass lesion. Confluent remote infarct in the left occipital lobe (left PCA territory) with evidence of prior hemorrhage. Remote lacunar infarcts in the right thalamus, left basal ganglia, right cerebellum, and left medulla. Additional punctate microhemorrhages in the left parietal lobe without edema. Additional mild for age scattered T2 hyperintensities in the white matter, nonspecific but likely related to chronic microvascular ischemic disease. Vascular: Major arterial flow voids are maintained skull base. Skull and upper cervical spine: Normal marrow signal. Sinuses/Orbits: Clear sinuses.  Unremarkable orbits. Other: No sizable mastoid effusions. IMPRESSION: 1. No evidence of acute intracranial abnormality. Specifically, no acute infarct. 2. 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 3. Remote lacunar infarcts in the right thalamus, left basal ganglia, right cerebellum, and left medulla. Electronically Signed  By: Margaretha Sheffield M.D.   On: 11/08/2020 13:11   DG Chest Portable 1 View  Result Date: 11/08/2020 CLINICAL DATA:  Fall.  Evaluate for infiltrate. EXAM: PORTABLE CHEST 1 VIEW COMPARISON:  09/21/2020 FINDINGS: 1033 hours. Low lung volumes with stable asymmetric elevation right hemidiaphragm. Cardiopericardial silhouette is at upper limits of normal for size. Status post CABG with aortic valve replacement. There is pulmonary vascular congestion without overt pulmonary edema. No focal consolidation or pleural effusion. Telemetry leads overlie the chest. IMPRESSION: Low volume chest x-ray with pulmonary vascular congestion. Electronically Signed   By: Misty Stanley M.D.   On: 11/08/2020 10:57        Scheduled Meds:  apixaban  5 mg Oral BID   escitalopram  10 mg Oral Daily   insulin aspart  0-5 Units Subcutaneous QHS   insulin aspart  0-9 Units Subcutaneous TID WC   insulin glargine-yfgn  40 Units Subcutaneous QHS   isosorbide mononitrate  30 mg Oral Daily    metFORMIN  1,000 mg Oral BID WC   Continuous Infusions:  azithromycin Stopped (11/08/20 1811)   lactated ringers 125 mL/hr at 11/09/20 0111     LOS: 0 days    Time spent: 34 mins     Wyvonnia Dusky, MD [facility] Pager [phone]  If 7PM-7AM, please contact night-coverage 11/09/2020, 7:44 AM

## 2020-11-09 NOTE — Progress Notes (Addendum)
Inpatient Diabetes Program Recommendations  AACE/ADA: New Consensus Statement on Inpatient Glycemic Control (2015)  Target Ranges:  Prepandial:   less than 140 mg/dL      Peak postprandial:   less than 180 mg/dL (1-2 hours)      Critically ill patients:  140 - 180 mg/dL   Results for BALTHAZAR, DOOLY (MRN [id number]) as of 11/09/2020 09:31  Ref. Range 11/08/2020 09:19 11/08/2020 15:00 11/08/2020 17:26 11/08/2020 18:20 11/08/2020 20:51  Glucose-Capillary Latest Ref Range: 70 - 99 mg/dL 461 (H)  8 units Novolog _0  559 (HH)  10 units Novolog _1  434 (H)  9 units Novolog _2  423 (H) 328 (H)  4 units Novolog _3   40 units Semglee _4   Results for ARVEL, OQUINN (MRN [id number]) as of 11/09/2020 09:31  Ref. Range 11/09/2020 08:21  Glucose-Capillary Latest Ref Range: 70 - 99 mg/dL 278 (H)  5 units Novolog   Results for JANN, RA (MRN [id number]) as of 11/09/2020 09:31  Ref. Range 08/24/2020 09:19 11/08/2020 13:56  Hemoglobin A1C Latest Ref Range: 4.8 - 5.6 % 12.9 (A) 11.4 (H)  (280 mg/dl)    To ED via EMS with Falls at home/ UTI/ Hyperglycemia  History: DM   Home DM Meds: Soliqua 40 units daily   Novolog 18 units TID   Metformin 1000 mg BID   Current Orders: Semglee 40 units QHS  Novolog 0-9 units TID ac + hs      Current A1c= 11.4% (was 12.9% in June 2022)--PCP is [facility]--last seen 10/20/2020--Was told to take the above meds and Libre 2 applied--Notes state pt likely forgetting meds at home    MD- Note CBG 278 this AM  Please consider:  1. Increase Semglee to 45 units QHS  2. Start Novolog Meal Coverage: Novolog 5 units TID with meals (takes Novolog 18 units TID at home)--1/3 total home dose to start Hold if pt eats <50% of meal, Hold if pt NPO    Addendum 10:40am--Met w/ pt at bedside this AM to discuss home DM care regimen and current A1c.  Pt told me he forgets to take his Diabetes medications sometimes (couldn't quantify how often  per week) and very rarely checks CBGs.  Has meds at home.  Sees PCP at [facility] internal medicine clinic.  Told me he would like to transfer his primary care to [place] b/c he lives in [place] and it is very far to drive to [place] but easier to get to [place].  I told pt I would place a consult to the [facility] team to ask them if they can help him switch his care and I encouraged pt to call the [facility] internal medicine clinic to see if they can also assist him in transferring his care to [facility].    Reviewed pt's current A1c of  11.4% (slightly down from 12.9% back in June).  Explained what an A1c is and what it measures.  Reminded patient that his goal A1c is 7% or less per ADA standards to prevent both acute and long-term complications.  Explained to patient the extreme importance of good glucose control at home.  Encouraged patient to check his CBGs at least TID AC at home and to record all CBGs in a logbook for his PCP to review.  Pt stated he has all his meds at home and does not have any trouble administering his insulin or taking his oral meds.  Stated he just needs to be more consistent with taking them.     --  Will follow patient during hospitalization--  Wyn Quaker RN, MSN, CDE Diabetes Coordinator Inpatient Glycemic Control Team Team Pager: [phone] (8a-5p)

## 2020-11-10 ENCOUNTER — Telehealth: Payer: Self-pay

## 2020-11-10 DIAGNOSIS — N179 Acute kidney failure, unspecified: Secondary | ICD-10-CM

## 2020-11-10 DIAGNOSIS — D696 Thrombocytopenia, unspecified: Secondary | ICD-10-CM

## 2020-11-10 DIAGNOSIS — E876 Hypokalemia: Secondary | ICD-10-CM

## 2020-11-10 DIAGNOSIS — Z794 Long term (current) use of insulin: Secondary | ICD-10-CM

## 2020-11-10 DIAGNOSIS — E1165 Type 2 diabetes mellitus with hyperglycemia: Secondary | ICD-10-CM

## 2020-11-10 DIAGNOSIS — N3001 Acute cystitis with hematuria: Principal | ICD-10-CM

## 2020-11-10 DIAGNOSIS — Z952 Presence of prosthetic heart valve: Secondary | ICD-10-CM

## 2020-11-10 DIAGNOSIS — N390 Urinary tract infection, site not specified: Secondary | ICD-10-CM

## 2020-11-10 DIAGNOSIS — E871 Hypo-osmolality and hyponatremia: Secondary | ICD-10-CM

## 2020-11-10 DIAGNOSIS — R1032 Left lower quadrant pain: Secondary | ICD-10-CM

## 2020-11-10 LAB — BASIC METABOLIC PANEL
Anion gap: 9 (ref 5–15)
BUN: 18 mg/dL (ref 8–23)
CO2: 24 mmol/L (ref 22–32)
Calcium: 8.5 mg/dL — ABNORMAL LOW (ref 8.9–10.3)
Chloride: 102 mmol/L (ref 98–111)
Creatinine, Ser: 0.7 mg/dL (ref 0.61–1.24)
GFR, Estimated: >60 mL/min (ref >60)
Glucose, Bld: 211 mg/dL — ABNORMAL HIGH (ref 70–99)
Potassium: 3.1 mmol/L — ABNORMAL LOW (ref 3.5–5.1)
Sodium: 135 mmol/L (ref 135–145)

## 2020-11-10 LAB — CBC
HCT: 30.4 % — ABNORMAL LOW (ref 39.0–52.0)
Hemoglobin: 10.6 g/dL — ABNORMAL LOW (ref 13.0–17.0)
MCH: 30.5 pg (ref 26.0–34.0)
MCHC: 34.9 g/dL (ref 30.0–36.0)
MCV: 87.6 fL (ref 80.0–100.0)
Platelets: 120 10*3/uL — ABNORMAL LOW (ref 150–400)
RBC: 3.47 MIL/uL — ABNORMAL LOW (ref 4.22–5.81)
RDW: 12.6 % (ref 11.5–15.5)
WBC: 2.4 10*3/uL — ABNORMAL LOW (ref 4.0–10.5)
nRBC: 0 % (ref 0.0–0.2)

## 2020-11-10 LAB — URINE CULTURE: Culture: >=100000 — AB

## 2020-11-10 LAB — GLUCOSE, CAPILLARY
Glucose-Capillary: 201 mg/dL — ABNORMAL HIGH (ref 70–99)
Glucose-Capillary: 281 mg/dL — ABNORMAL HIGH (ref 70–99)

## 2020-11-10 LAB — PROCALCITONIN: Procalcitonin: 3.41 ng/mL

## 2020-11-10 MED ORDER — INSULIN GLARGINE-YFGN 100 UNIT/ML ~~LOC~~ SOLN
45.0000 [IU] | Freq: Every day | SUBCUTANEOUS | Status: DC
  Filled 2020-11-10: qty 0.45

## 2020-11-10 MED ORDER — POTASSIUM CHLORIDE CRYS ER 20 MEQ PO TBCR
40.0000 meq | EXTENDED_RELEASE_TABLET | Freq: Once | ORAL | Status: AC
  Administered 2020-11-10: 40 meq via ORAL
  Filled 2020-11-10: qty 2

## 2020-11-10 MED ORDER — LEVOFLOXACIN 750 MG PO TABS: 750.0000 mg | ORAL_TABLET | Freq: Every day | ORAL | 0 refills | Status: AC

## 2020-11-10 MED ORDER — METRONIDAZOLE 500 MG PO TABS: 500.0000 mg | ORAL_TABLET | Freq: Two times a day (BID) | ORAL | 0 refills | Status: AC

## 2020-11-10 MED ORDER — POTASSIUM CHLORIDE CRYS ER 20 MEQ PO TBCR: EXTENDED_RELEASE_TABLET | ORAL | 0 refills | Status: DC

## 2020-11-10 MED ORDER — MAGNESIUM OXIDE -MG SUPPLEMENT 400 (240 MG) MG PO TABS
400.0000 mg | ORAL_TABLET | Freq: Every day | ORAL | Status: DC
  Administered 2020-11-10: 400 mg via ORAL
  Filled 2020-11-10: qty 1

## 2020-11-10 NOTE — Progress Notes (Signed)
DISCHARGE NOTE:  Pt given discharge instructions. Pt verbalized understanding. Belongings sent with pt. Pt wheeled to car by staff. Friend providing transportation.

## 2020-11-10 NOTE — Telephone Encounter (Signed)
Hospital TOC, discharge 11/10/2020, appt 11/18/2020.

## 2020-11-10 NOTE — Discharge Summary (Signed)
[place] [facility] [place] NAME: Mark Cabrera    MR#:  [id number]  DATE OF BIRTH:  1950-07-11  DATE OF ADMISSION:  11/08/2020 ADMITTING PHYSICIAN: Wyvonnia Dusky, MD  DATE OF DISCHARGE: 11/10/2020 12:50 PM  PRIMARY CARE PHYSICIAN: [name], MD    ADMISSION DIAGNOSIS:  Weakness [R53.1] Hyperglycemia [R73.9] Acute cystitis without hematuria [N30.00] CAP (community acquired pneumonia) [J18.9]  DISCHARGE DIAGNOSIS:  Principal Problem:   Weakness Active Problems:   Dyslipidemia   Hypertensive heart disease   History of dissection of vertebral artery  (HCC)   Sleep apnea   Chronic diastolic heart failure (HCC)   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)   CAD (coronary artery disease), native coronary artery   S/P CABG x 2   Paroxysmal atrial fibrillation (HCC)   Decreased visual acuity   Lipoma of left upper extremity   Hepatic cirrhosis (HCC)   Depression   Fatigue   Mild cognitive impairment   Hyponatremia   CAP (community acquired pneumonia)   SECONDARY DIAGNOSIS:   Past Medical History:  Diagnosis Date   Acute diastolic congestive heart failure ([place])    Aortic valve stenosis s/p AVR 2018   Echo 2/22: Poor acoustic windows, EF 50-55, mild LVH, normal RVSF, mild MR, trivial AI, no AS, no pericardial effusion   Atrial fibrillation (HCC) - post-op CABG    04/2016 CHA2DS2VAS score = 5   Atypical nevi    Coronary artery disease s/p 2 vessel CABG    Depression    "years ago"   Diabetes mellitus    Dyspnea    in the past    GERD (gastroesophageal reflux disease)    Heart murmur    Hepatic cirrhosis ([place]) 06/29/2017   History of kidney stones    Hyperlipidemia    hx of transaminitis secondary to statin and he has decided not to use statins secondary to potential side effects.   Hypertension    Nephrolithiasis    Osteoarthritis, knee    Sleep apnea     Central apnea. 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 2-D echo was normal no embolic source found.    HOSPITAL COURSE:   Acute cystitis with hematuria.  Urine culture growing Enterobacter.  Patient received 3 days of Rocephin and will switch over to 4 days of Levaquin upon discharge. Initially believed to have pneumonia patient was given 3 days of Zithromax and 3 days of Rocephin.  We will switch antibiotics over to Levaquin upon discharge.  Radiologist did not read the chest x-ray as pneumonia.  Pneumonia ruled out. Left lower quadrant pain.  Could be mild diverticulitis.  Antibiotics switched over to Levaquin (4 days) and Flagyl (5 days). Acute kidney injury with creatinine of 1.28 on presentation and down to 0.70 upon discharge. Type 2 diabetes mellitus uncontrolled.  Patient on 40 units of glargine insulin, short acting insulin prior to meals. Some diarrhea we will hold metformin at this time History of aortic valve replacement on Eliquis Obstructive sleep apnea on CPAP at night Fall and hematoma.  Physical therapy recommending home health. Thrombocytopenia and  leukopenia.  Recheck as outpatient.  If still low can refer to hematology.  Looking back at imaging of ultrasound abdomen in 2021 consistent with cirrhosis of the liver which could explain the lab findings. Hyponatremia secondary to poorly controlled diabetes.  Sodium came up from 1 26-1 35 Hypokalemia and hypomagnesemia replaced during the hospital course.  Replace potassium upon going home. Depression on Lexapro Anemia of chronic disease  DISCHARGE CONDITIONS:   Fair  CONSULTS OBTAINED:  None  DRUG ALLERGIES:   Allergies   Allergen Reactions   Morpholine Salicylate Other (See Comments)    Hallucinations   Penicillins Other (See Comments)    Allergic- reaction?? Has patient had a PCN reaction causing immediate rash, facial/tongue/throat swelling, SOB or lightheadedness with hypotension: Unk Has patient had a PCN reaction causing severe rash involving mucus membranes or skin necrosis: Unk Has patient had a PCN reaction that required hospitalization: Unk Has patient had a PCN reaction occurring within the last 10 years: No If all of the above answers are "NO", then may proceed with Cephalosporin use.  Other reaction(s): Unknown   Morphine And Related Other (See Comments)    Hallucinations    Sglt2 Inhibitors Rash and Other (See Comments)    Candidiasis infection prone   Statins Rash and Other (See Comments)    Severe rash and back pain, and muscle pain    DISCHARGE MEDICATIONS:   Allergies as of 11/10/2020       Reactions   Morpholine Salicylate Other (See Comments)   Hallucinations   Penicillins Other (See Comments)   Allergic- reaction?? Has patient had a PCN reaction causing immediate rash, facial/tongue/throat swelling, SOB or lightheadedness with hypotension: Unk Has patient had a PCN reaction causing severe rash involving mucus membranes or skin necrosis: Unk Has patient had a PCN reaction that required hospitalization: Unk Has patient had a PCN reaction occurring within the last 10 years: No If all of the above answers are "NO", then may proceed with Cephalosporin use. Other reaction(s): Unknown   Morphine And Related Other (See Comments)   Hallucinations   Sglt2 Inhibitors Rash, Other (See Comments)   Candidiasis infection prone   Statins Rash, Other (See Comments)   Severe rash and back pain, and muscle pain        Medication List     STOP taking these medications    metFORMIN 500 MG tablet Commonly known as: GLUCOPHAGE   Nexlizet 180-10 MG Tabs Generic drug: Bempedoic  Acid-Ezetimibe   nitroGLYCERIN 0.4 MG SL tablet Commonly known as: NITROSTAT       TAKE these medications    acetaminophen 325 MG tablet Commonly known as: TYLENOL Take 2 tablets (650 mg total) by mouth every 4 (four) hours as needed for headache or mild pain.   apixaban 5 MG Tabs tablet Commonly known as: ELIQUIS TAKE 1 TABLET (5 MG TOTAL) BY MOUTH 2 (TWO) TIMES DAILY.   Bayer Microlet Lancets lancets Check blood sugar 3 times a day as instructed   Contour Next Test test strip Generic drug: glucose blood USE 1 TO CHECK BLOOD GLUCOSE THREE TIMES DAILY AS DIRECTED.   Contour Next Test test strip Generic drug: glucose blood Check blood sugar 3 times a day as instructed   escitalopram 10 MG tablet Commonly known as: LEXAPRO Take 10 mg by mouth daily.   furosemide 40 MG tablet Commonly known as: Lasix Take 1 tablet (40 mg total) by mouth daily.   Insulin Pen Needle 31G X 5 MM Misc  Use one pen needle three times daily. Dx E11.49   isosorbide mononitrate 30 MG 24 hr tablet Commonly known as: IMDUR Take 1 tablet (30 mg total) by mouth daily.   levofloxacin 750 MG tablet Commonly known as: Levaquin Take 1 tablet (750 mg total) by mouth daily for 4 days. Start taking on: November 11, 2020   lisinopril 10 MG tablet Commonly known as: ZESTRIL Take 1 tablet (10 mg total) by mouth daily.   metroNIDAZOLE 500 MG tablet Commonly known as: Flagyl Take 1 tablet (500 mg total) by mouth 2 (two) times daily for 7 days.   NovoLOG FlexPen 100 UNIT/ML FlexPen Generic drug: insulin aspart INJECT 18 UNITS UNDER THE SKIN THREE TIMES DAILY WITH MEALS   potassium chloride SA 20 MEQ tablet Commonly known as: KLOR-CON One tablet twice a day for five days then one tablet daily after that   Repatha SureClick [id number] MG/ML Soaj Generic drug: Evolocumab INJECT 1 PEN INTO THE SKIN EVERY 14 (FOURTEEN) DAYS.   Soliqua 100-33 UNT-MCG/ML Sopn Generic drug: Insulin  Glargine-Lixisenatide Inject 40 Units into the skin daily.         DISCHARGE INSTRUCTIONS:   Follow-up PMD 5 days  If you experience worsening of your admission symptoms, develop shortness of breath, life threatening emergency, suicidal or homicidal thoughts you must seek medical attention immediately by calling 911 or calling your MD immediately  if symptoms less severe.  You Must read complete instructions/literature along with all the possible adverse reactions/side effects for all the Medicines you take and that have been prescribed to you. Take any new Medicines after you have completely understood and accept all the possible adverse reactions/side effects.   Please note  You were cared for by a hospitalist during your hospital stay. If you have any questions about your discharge medications or the care you received while you were in the hospital after you are discharged, you can call the unit and asked to speak with the hospitalist on call if the hospitalist that took care of you is not available. Once you are discharged, your primary care physician will handle any further medical issues. Please note that NO REFILLS for any discharge medications will be authorized once you are discharged, as it is imperative that you return to your primary care physician (or establish a relationship with a primary care physician if you do not have one) for your aftercare needs so that they can reassess your need for medications and monitor your lab values.    Today   CHIEF COMPLAINT:   Chief Complaint  Patient presents with   Fall    HISTORY OF PRESENT ILLNESS:  Voss Kesselring  is a 70 y.o. male came in with a fall and weakness and found to have acute cystitis.   VITAL SIGNS:  Blood pressure 132/67, pulse 70, temperature (!) 97.3 F (36.3 C), resp. rate 15, height '5\' 8"'$  (1.727 m), weight 96.5 kg, SpO2 99 %.    PHYSICAL EXAMINATION:  GENERAL:  70 y.o.-year-old patient lying in the bed with  no acute distress.  EYES: Pupils equal, round, reactive to light and accommodation. No scleral icterus.  HEENT: Head atraumatic, normocephalic. Oropharynx and nasopharynx clear.  LUNGS: Normal breath sounds bilaterally, no wheezing, rales,rhonchi or crepitation. No use of accessory muscles of respiration.  CARDIOVASCULAR: S1, S2 normal. No murmurs, rubs, or gallops.  ABDOMEN: Soft, non-tender, non-distended.  EXTREMITIES: No pedal edema.  NEUROLOGIC: Cranial nerves II through XII are intact. Muscle strength 5/5 in all  extremities. Sensation intact. Gait not checked.  PSYCHIATRIC: The patient is alert and answers questions appropriately.  SKIN: No obvious rash, lesion, or ulcer.   DATA REVIEW:   CBC Recent Labs  Lab 11/10/20 0459  WBC 2.4*  HGB 10.6*  HCT 30.4*  PLT 120*    Chemistries  Recent Labs  Lab 11/08/20 0920 11/09/20 0433 11/10/20 0459  NA 126* 131* 135  K 4.1 3.4* 3.1*  CL 92* 98 102  CO2 21* 22 24  GLUCOSE 488* 286* 211*  BUN '16 18 18  '$ CREATININE 1.28* 1.08 0.70  CALCIUM 8.9 8.5* 8.5*  MG 1.5* 1.8  --   AST 32  --   --   ALT 34  --   --   ALKPHOS 67  --   --   BILITOT 1.9*  --   --     Microbiology Results  Results for orders placed or performed during the hospital encounter of 11/08/20  Urine Culture     Status: Abnormal   Collection Time: 11/08/20  9:20 AM   Specimen: Urine, Clean Catch  Result Value Ref Range Status   Specimen Description   Final    URINE, CLEAN CATCH Performed at [facility], [address], [place], [place] [address]    Special Requests   Final    NONE Performed at [facility], [address], [place],  [address]    Culture >=100,000 COLONIES/mL ENTEROBACTER AEROGENES (A)  Final   Report Status 11/10/2020 FINAL  Final   Organism ID, Bacteria ENTEROBACTER AEROGENES (A)  Final      Susceptibility   Enterobacter aerogenes - MIC*    CEFAZOLIN >=64 RESISTANT Resistant     CEFEPIME <=0.12 SENSITIVE  Sensitive     CEFTRIAXONE <=0.25 SENSITIVE Sensitive     CIPROFLOXACIN <=0.25 SENSITIVE Sensitive     GENTAMICIN <=1 SENSITIVE Sensitive     IMIPENEM 1 SENSITIVE Sensitive     NITROFURANTOIN 64 INTERMEDIATE Intermediate     TRIMETH/SULFA <=20 SENSITIVE Sensitive     PIP/TAZO <=4 SENSITIVE Sensitive     * >=100,000 COLONIES/mL ENTEROBACTER AEROGENES  Resp Panel by RT-PCR (Flu A&B, Covid) Nasopharyngeal Swab     Status: None   Collection Time: 11/08/20 10:21 AM   Specimen: Nasopharyngeal Swab; Nasopharyngeal(NP) swabs in vial transport medium  Result Value Ref Range Status   SARS Coronavirus 2 by RT PCR NEGATIVE NEGATIVE Final    Comment: (NOTE) SARS-CoV-2 target nucleic acids are NOT DETECTED.  The SARS-CoV-2 RNA is generally detectable in upper respiratory specimens during the acute phase of infection. The lowest concentration of SARS-CoV-2 viral copies this assay can detect is 138 copies/mL. A negative result does not preclude SARS-Cov-2 infection and should not be used as the sole basis for treatment or other patient management decisions. A negative result may occur with  improper specimen collection/handling, submission of specimen other than nasopharyngeal swab, presence of viral mutation(s) within the areas targeted by this assay, and inadequate number of viral copies(<138 copies/mL). A negative result must be combined with clinical observations, patient history, and epidemiological information. The expected result is Negative.  Fact Sheet for Patients:  EntrepreneurPulse.com.au  Fact Sheet for Healthcare Providers:  IncredibleEmployment.be  This test is no t yet approved or cleared by the [place] FDA and  has been authorized for detection and/or diagnosis of SARS-CoV-2 by FDA under an Emergency Use Authorization (EUA). This EUA will remain  in effect (meaning this test can be used) for  the duration of the COVID-19 declaration  under Section 564(b)(1) of the Act, 21 U.S.C.section 360bbb-3(b)(1), unless the authorization is terminated  or revoked sooner.       Influenza A by PCR NEGATIVE NEGATIVE Final   Influenza B by PCR NEGATIVE NEGATIVE Final    Comment: (NOTE) The Xpert Xpress SARS-CoV-2/FLU/RSV plus assay is intended as an aid in the diagnosis of influenza from Nasopharyngeal swab specimens and should not be used as a sole basis for treatment. Nasal washings and aspirates are unacceptable for Xpert Xpress SARS-CoV-2/FLU/RSV testing.  Fact Sheet for Patients: EntrepreneurPulse.com.au  Fact Sheet for Healthcare Providers: IncredibleEmployment.be  This test is not yet approved or cleared by the [place] FDA and has been authorized for detection and/or diagnosis of SARS-CoV-2 by FDA under an Emergency Use Authorization (EUA). This EUA will remain in effect (meaning this test can be used) for the duration of the COVID-19 declaration under Section 564(b)(1) of the Act, 21 U.S.C. section 360bbb-3(b)(1), unless the authorization is terminated or revoked.  Performed at [facility], [address] [place], [place] [address]     Management plans discussed with the patient, and he is agreement.  Left message for patient's daughter.  Also tried calling patient's roommate but unable to leave a message on that phone number.  CODE STATUS:     Code Status Orders  (From admission, onward)           Start     Ordered   11/08/20 1215  Full code  Continuous        11/08/20 1215           Code Status History     Date Active Date Inactive Code Status Order ID Comments User Context   02/22/2020 1637 02/24/2020 2020 DNR [id number]  Jose Persia, MD ED   07/01/2018 1508 07/09/2018 1406 Partial Code [id number]  Cathlyn Parsons, PA-C Inpatient   07/01/2018 1508 07/01/2018 1508 Full Code [id number]  Cathlyn Parsons, PA-C Inpatient   06/28/2018 2004  07/01/2018 1501 Partial Code [id number]  Velna Ochs, MD ED   04/16/2016 2212 04/28/2016 2234 Full Code [id number]  Jacolyn Reedy, MD Inpatient   03/30/2016 1408 04/10/2016 1756 Full Code [id number]  Elgie Collard, PA-C Inpatient   03/15/2016 1357 03/18/2016 1523 Full Code [id number]  Burnell Blanks, MD Inpatient      Advance Directive Documentation    Flowsheet Row Most Recent Value  Type of Advance Directive Healthcare Power of Attorney, Living will  Pre-existing out of facility DNR order (yellow form or pink MOST form) --  "MOST" Form in Place? --       TOTAL TIME TAKING CARE OF THIS PATIENT: 35 minutes.    Loletha Grayer M.D on 11/10/2020 at 5:49 PM  [facility]  CC: Primary care physician; [name], MD

## 2020-11-16 DIAGNOSIS — N3001 Acute cystitis with hematuria: Secondary | ICD-10-CM | POA: Diagnosis not present

## 2020-11-16 DIAGNOSIS — N179 Acute kidney failure, unspecified: Secondary | ICD-10-CM | POA: Diagnosis not present

## 2020-11-16 DIAGNOSIS — M1710 Unilateral primary osteoarthritis, unspecified knee: Secondary | ICD-10-CM | POA: Diagnosis not present

## 2020-11-16 DIAGNOSIS — B9689 Other specified bacterial agents as the cause of diseases classified elsewhere: Secondary | ICD-10-CM | POA: Diagnosis not present

## 2020-11-16 DIAGNOSIS — G4731 Primary central sleep apnea: Secondary | ICD-10-CM | POA: Diagnosis not present

## 2020-11-16 DIAGNOSIS — D1722 Benign lipomatous neoplasm of skin and subcutaneous tissue of left arm: Secondary | ICD-10-CM | POA: Diagnosis not present

## 2020-11-16 DIAGNOSIS — E871 Hypo-osmolality and hyponatremia: Secondary | ICD-10-CM | POA: Diagnosis not present

## 2020-11-16 DIAGNOSIS — D630 Anemia in neoplastic disease: Secondary | ICD-10-CM | POA: Diagnosis not present

## 2020-11-16 DIAGNOSIS — Z952 Presence of prosthetic heart valve: Secondary | ICD-10-CM | POA: Diagnosis not present

## 2020-11-16 DIAGNOSIS — I2510 Atherosclerotic heart disease of native coronary artery without angina pectoris: Secondary | ICD-10-CM | POA: Diagnosis not present

## 2020-11-16 DIAGNOSIS — E1165 Type 2 diabetes mellitus with hyperglycemia: Secondary | ICD-10-CM | POA: Diagnosis not present

## 2020-11-16 DIAGNOSIS — I110 Hypertensive heart disease with heart failure: Secondary | ICD-10-CM | POA: Diagnosis not present

## 2020-11-16 DIAGNOSIS — K7460 Unspecified cirrhosis of liver: Secondary | ICD-10-CM | POA: Diagnosis not present

## 2020-11-16 DIAGNOSIS — I480 Paroxysmal atrial fibrillation: Secondary | ICD-10-CM | POA: Diagnosis not present

## 2020-11-16 DIAGNOSIS — D696 Thrombocytopenia, unspecified: Secondary | ICD-10-CM | POA: Diagnosis not present

## 2020-11-16 DIAGNOSIS — K219 Gastro-esophageal reflux disease without esophagitis: Secondary | ICD-10-CM | POA: Diagnosis not present

## 2020-11-16 DIAGNOSIS — H547 Unspecified visual loss: Secondary | ICD-10-CM | POA: Diagnosis not present

## 2020-11-16 DIAGNOSIS — G3184 Mild cognitive impairment, so stated: Secondary | ICD-10-CM | POA: Diagnosis not present

## 2020-11-16 DIAGNOSIS — I5033 Acute on chronic diastolic (congestive) heart failure: Secondary | ICD-10-CM | POA: Diagnosis not present

## 2020-11-16 DIAGNOSIS — F32A Depression, unspecified: Secondary | ICD-10-CM | POA: Diagnosis not present

## 2020-11-16 DIAGNOSIS — F17210 Nicotine dependence, cigarettes, uncomplicated: Secondary | ICD-10-CM | POA: Diagnosis not present

## 2020-11-16 DIAGNOSIS — D72819 Decreased white blood cell count, unspecified: Secondary | ICD-10-CM | POA: Diagnosis not present

## 2020-11-16 DIAGNOSIS — G4733 Obstructive sleep apnea (adult) (pediatric): Secondary | ICD-10-CM | POA: Diagnosis not present

## 2020-11-16 DIAGNOSIS — J189 Pneumonia, unspecified organism: Secondary | ICD-10-CM | POA: Diagnosis not present

## 2020-11-16 DIAGNOSIS — E785 Hyperlipidemia, unspecified: Secondary | ICD-10-CM | POA: Diagnosis not present

## 2020-11-17 ENCOUNTER — Ambulatory Visit
Admission: RE | Admit: 2020-11-17 | Discharge: 2020-11-17 | Disposition: A | Discharge status: Home / Self Care | Payer: Medicare Other | Source: Ambulatory Visit | Attending: Internal Medicine | Admitting: Internal Medicine

## 2020-11-17 ENCOUNTER — Other Ambulatory Visit: Payer: Self-pay

## 2020-11-17 DIAGNOSIS — K7460 Unspecified cirrhosis of liver: Secondary | ICD-10-CM | POA: Diagnosis not present

## 2020-11-18 ENCOUNTER — Encounter: Payer: Medicare Other | Admitting: Internal Medicine

## 2020-11-18 ENCOUNTER — Other Ambulatory Visit (HOSPITAL_BASED_OUTPATIENT_CLINIC_OR_DEPARTMENT_OTHER): Payer: Self-pay

## 2020-11-19 ENCOUNTER — Ambulatory Visit (INDEPENDENT_AMBULATORY_CARE_PROVIDER_SITE_OTHER): Payer: Medicare Other | Admitting: Internal Medicine

## 2020-11-19 ENCOUNTER — Other Ambulatory Visit: Payer: Self-pay

## 2020-11-19 ENCOUNTER — Telehealth: Payer: Self-pay

## 2020-11-19 DIAGNOSIS — N481 Balanitis: Secondary | ICD-10-CM | POA: Diagnosis not present

## 2020-11-19 DIAGNOSIS — E1165 Type 2 diabetes mellitus with hyperglycemia: Secondary | ICD-10-CM | POA: Diagnosis not present

## 2020-11-19 DIAGNOSIS — I5033 Acute on chronic diastolic (congestive) heart failure: Secondary | ICD-10-CM | POA: Diagnosis not present

## 2020-11-19 DIAGNOSIS — J189 Pneumonia, unspecified organism: Secondary | ICD-10-CM | POA: Diagnosis not present

## 2020-11-19 DIAGNOSIS — B9689 Other specified bacterial agents as the cause of diseases classified elsewhere: Secondary | ICD-10-CM | POA: Diagnosis not present

## 2020-11-19 DIAGNOSIS — N3001 Acute cystitis with hematuria: Secondary | ICD-10-CM | POA: Diagnosis not present

## 2020-11-19 DIAGNOSIS — I110 Hypertensive heart disease with heart failure: Secondary | ICD-10-CM | POA: Diagnosis not present

## 2020-11-19 MED ORDER — FLUCONAZOLE 150 MG PO TABS: 150.0000 mg | ORAL_TABLET | Freq: Every day | ORAL | 0 refills | Status: DC

## 2020-11-19 NOTE — Progress Notes (Signed)
  [facility] Medicine Residency Telephone Encounter Continuity Care Appointment  HPI:  This telephone encounter was created for [name] on 11/19/2020 for penile swelling. Please refer to problem based charting for assessment and plan.    Assessment / Plan / Recommendations:   Balanitis   HPI: Pt requested a telehealth visit this afternoon for 4 day history of swelling and pain of his foreskin and head of the penis. He was recently admitted for enterobacter UTI for which he received 3 days of rocephin and was discharged with Levaquin to complete a total 7 day course. He took his last dose around 9/5 or 9/6. The day after completing the treatment, the swelling began and has become slightly worse. He is able to retract the foreskin, although painful. He denies suprapubic pain. Has mild dysuria. Denies fevers, chills, or drainage of the foreskin. No open lesions. Plan Fluconazole '150mg'$  x1 dose Counseled on hygeine--keep clean and dry If symptoms do not improve or continue to worsen by Monday, he will come in for an office visit. If he is unable to retract the foreskin, he is instructed to see immediate evaluation in the ED.      As always, pt is advised that if symptoms worsen or new symptoms arise, they should go to an urgent care facility or to to ER for further evaluation.   Consent and Medical Decision Making:  Patient discussed with Dr.  Jimmye Norman This is a telephone encounter between Mark Cabrera and [employer] on 11/19/2020 for balantitis. The visit was conducted with the patient located at home and [employer] at [facility]. The patient's identity was confirmed using their DOB and current address. The patient has consented to being evaluated through a telephone encounter and understands the associated risks (an examination cannot be done and the patient may need to come in for an appointment) / benefits (allows the patient to remain at home, decreasing exposure to  coronavirus). I personally spent 25 minutes on medical discussion.    Mitzi Hansen, MD Internal Medicine Resident PGY-3 [facility] Res[facility] Pager: [phone] 11/19/2020 4:16 PM

## 2020-11-19 NOTE — Telephone Encounter (Signed)
Return pt's call - stated he given an abx in the ED, Levofloxacin and stated he needs more. Informed he needs an in- person appt. C/o scrotum swollen, re and burning. Stated he's trying to get more abx before the weekend; he lives in [place] and will have to call transportation if he has to come in next week. Explained telehealth appt today - he's agreeable - schedule w/ Dr Darrick Meigs.

## 2020-11-19 NOTE — Telephone Encounter (Signed)
Pt is requesting a call back .. he stated that his traveling nurse just left and he was told to call in for more antibiotics due to he is still not feeling well

## 2020-11-19 NOTE — Assessment & Plan Note (Addendum)
HPI: Pt requested a telehealth visit this afternoon for 4 day history of swelling and pain of his foreskin and head of the penis. He was recently admitted for enterobacter UTI for which he received 3 days of rocephin and was discharged with Levaquin to complete a total 7 day course. He took his last dose around 9/5 or 9/6. The day after completing the treatment, the swelling began and has become slightly worse. He is able to retract the foreskin, although painful. He denies suprapubic pain. Has mild dysuria. Denies fevers, chills, or drainage of the foreskin. No open lesions. Plan  Fluconazole '150mg'$  x1 dose  Counseled on hygeine--keep clean and dry  If symptoms do not improve or continue to worsen by Monday, he will come in for an office visit. If he is unable to retract the foreskin, he is instructed to see immediate evaluation in the ED.

## 2020-11-22 ENCOUNTER — Telehealth: Payer: Self-pay | Admitting: Student

## 2020-11-22 ENCOUNTER — Encounter: Payer: Self-pay | Admitting: Internal Medicine

## 2020-11-22 ENCOUNTER — Encounter: Payer: Medicare Other | Admitting: Internal Medicine

## 2020-11-22 NOTE — Telephone Encounter (Signed)
Rec'd call from a [place] requesting VO.  1 week 1 2 week 3 1 week 1   Please call back for OT  VO request

## 2020-11-22 NOTE — Telephone Encounter (Signed)
Call transferred to Triage. Pt had telehealth appt on Friday 9/9 with Dr Darrick Meigs. Stated he took the one pill (Diflucan) which helped, his penis is back to normal color. But he's having difficulty urinating and he notice red blood in his stool. Stated he rides transportation which he has to schedule 2 days in advance, and he lives in [place].  Chilon will try using [employer] transportation service to bring pt in for 1415 PM appt today with Dr Gilford Rile. Arrangement has been made  -pt called per Chilon.

## 2020-11-22 NOTE — Telephone Encounter (Signed)
Returned call  Verbal authorization given

## 2020-11-22 NOTE — Telephone Encounter (Signed)
Pt reporting blood in stool.

## 2020-11-23 ENCOUNTER — Encounter: Payer: Medicare Other | Admitting: Internal Medicine

## 2020-11-23 ENCOUNTER — Telehealth: Payer: Self-pay | Admitting: *Deleted

## 2020-11-23 DIAGNOSIS — I110 Hypertensive heart disease with heart failure: Secondary | ICD-10-CM | POA: Diagnosis not present

## 2020-11-23 DIAGNOSIS — J189 Pneumonia, unspecified organism: Secondary | ICD-10-CM | POA: Diagnosis not present

## 2020-11-23 DIAGNOSIS — I5033 Acute on chronic diastolic (congestive) heart failure: Secondary | ICD-10-CM | POA: Diagnosis not present

## 2020-11-23 DIAGNOSIS — E1165 Type 2 diabetes mellitus with hyperglycemia: Secondary | ICD-10-CM | POA: Diagnosis not present

## 2020-11-23 DIAGNOSIS — B9689 Other specified bacterial agents as the cause of diseases classified elsewhere: Secondary | ICD-10-CM | POA: Diagnosis not present

## 2020-11-23 DIAGNOSIS — N3001 Acute cystitis with hematuria: Secondary | ICD-10-CM | POA: Diagnosis not present

## 2020-11-23 NOTE — Telephone Encounter (Signed)
Pt called to cancel his appt today; transportation was provide thru the hospital. I asked about blood in stool - he stated it's gone. Stated he's too weak to get up to get dress and unable to tolerate the ride here and back. No problem w/urinating, just walking to the bathroom - wearing a diaper. Stated he's eating and drinking. Stated someone is there with him; the person he rents from. Stated he will call if he needs [place]. Appt has been canceled for today.

## 2020-11-24 DIAGNOSIS — I110 Hypertensive heart disease with heart failure: Secondary | ICD-10-CM | POA: Diagnosis not present

## 2020-11-24 DIAGNOSIS — B9689 Other specified bacterial agents as the cause of diseases classified elsewhere: Secondary | ICD-10-CM | POA: Diagnosis not present

## 2020-11-24 DIAGNOSIS — E1165 Type 2 diabetes mellitus with hyperglycemia: Secondary | ICD-10-CM | POA: Diagnosis not present

## 2020-11-24 DIAGNOSIS — J189 Pneumonia, unspecified organism: Secondary | ICD-10-CM | POA: Diagnosis not present

## 2020-11-24 DIAGNOSIS — N3001 Acute cystitis with hematuria: Secondary | ICD-10-CM | POA: Diagnosis not present

## 2020-11-24 DIAGNOSIS — I5033 Acute on chronic diastolic (congestive) heart failure: Secondary | ICD-10-CM | POA: Diagnosis not present

## 2020-11-25 DIAGNOSIS — J189 Pneumonia, unspecified organism: Secondary | ICD-10-CM | POA: Diagnosis not present

## 2020-11-25 DIAGNOSIS — B9689 Other specified bacterial agents as the cause of diseases classified elsewhere: Secondary | ICD-10-CM | POA: Diagnosis not present

## 2020-11-25 DIAGNOSIS — I110 Hypertensive heart disease with heart failure: Secondary | ICD-10-CM | POA: Diagnosis not present

## 2020-11-25 DIAGNOSIS — N3001 Acute cystitis with hematuria: Secondary | ICD-10-CM | POA: Diagnosis not present

## 2020-11-25 DIAGNOSIS — I5033 Acute on chronic diastolic (congestive) heart failure: Secondary | ICD-10-CM | POA: Diagnosis not present

## 2020-11-25 DIAGNOSIS — E1165 Type 2 diabetes mellitus with hyperglycemia: Secondary | ICD-10-CM | POA: Diagnosis not present

## 2020-11-27 DIAGNOSIS — I110 Hypertensive heart disease with heart failure: Secondary | ICD-10-CM | POA: Diagnosis not present

## 2020-11-27 DIAGNOSIS — N3001 Acute cystitis with hematuria: Secondary | ICD-10-CM | POA: Diagnosis not present

## 2020-11-27 DIAGNOSIS — J189 Pneumonia, unspecified organism: Secondary | ICD-10-CM | POA: Diagnosis not present

## 2020-11-27 DIAGNOSIS — B9689 Other specified bacterial agents as the cause of diseases classified elsewhere: Secondary | ICD-10-CM | POA: Diagnosis not present

## 2020-11-27 DIAGNOSIS — E1165 Type 2 diabetes mellitus with hyperglycemia: Secondary | ICD-10-CM | POA: Diagnosis not present

## 2020-11-27 DIAGNOSIS — I5033 Acute on chronic diastolic (congestive) heart failure: Secondary | ICD-10-CM | POA: Diagnosis not present

## 2020-11-30 DIAGNOSIS — N3001 Acute cystitis with hematuria: Secondary | ICD-10-CM | POA: Diagnosis not present

## 2020-11-30 DIAGNOSIS — J189 Pneumonia, unspecified organism: Secondary | ICD-10-CM | POA: Diagnosis not present

## 2020-11-30 DIAGNOSIS — E1165 Type 2 diabetes mellitus with hyperglycemia: Secondary | ICD-10-CM | POA: Diagnosis not present

## 2020-11-30 DIAGNOSIS — I110 Hypertensive heart disease with heart failure: Secondary | ICD-10-CM | POA: Diagnosis not present

## 2020-11-30 DIAGNOSIS — I5033 Acute on chronic diastolic (congestive) heart failure: Secondary | ICD-10-CM | POA: Diagnosis not present

## 2020-11-30 DIAGNOSIS — B9689 Other specified bacterial agents as the cause of diseases classified elsewhere: Secondary | ICD-10-CM | POA: Diagnosis not present

## 2020-12-01 DIAGNOSIS — B9689 Other specified bacterial agents as the cause of diseases classified elsewhere: Secondary | ICD-10-CM | POA: Diagnosis not present

## 2020-12-01 DIAGNOSIS — I110 Hypertensive heart disease with heart failure: Secondary | ICD-10-CM | POA: Diagnosis not present

## 2020-12-01 DIAGNOSIS — I5033 Acute on chronic diastolic (congestive) heart failure: Secondary | ICD-10-CM | POA: Diagnosis not present

## 2020-12-01 DIAGNOSIS — N3001 Acute cystitis with hematuria: Secondary | ICD-10-CM | POA: Diagnosis not present

## 2020-12-01 DIAGNOSIS — J189 Pneumonia, unspecified organism: Secondary | ICD-10-CM | POA: Diagnosis not present

## 2020-12-01 DIAGNOSIS — E1165 Type 2 diabetes mellitus with hyperglycemia: Secondary | ICD-10-CM | POA: Diagnosis not present

## 2020-12-02 ENCOUNTER — Telehealth: Payer: Self-pay | Admitting: *Deleted

## 2020-12-02 ENCOUNTER — Telehealth: Payer: Self-pay

## 2020-12-02 ENCOUNTER — Other Ambulatory Visit: Payer: Self-pay | Admitting: Internal Medicine

## 2020-12-02 DIAGNOSIS — B9689 Other specified bacterial agents as the cause of diseases classified elsewhere: Secondary | ICD-10-CM | POA: Diagnosis not present

## 2020-12-02 DIAGNOSIS — E1165 Type 2 diabetes mellitus with hyperglycemia: Secondary | ICD-10-CM | POA: Diagnosis not present

## 2020-12-02 DIAGNOSIS — E1140 Type 2 diabetes mellitus with diabetic neuropathy, unspecified: Secondary | ICD-10-CM

## 2020-12-02 DIAGNOSIS — I110 Hypertensive heart disease with heart failure: Secondary | ICD-10-CM | POA: Diagnosis not present

## 2020-12-02 DIAGNOSIS — J189 Pneumonia, unspecified organism: Secondary | ICD-10-CM | POA: Diagnosis not present

## 2020-12-02 DIAGNOSIS — Z794 Long term (current) use of insulin: Secondary | ICD-10-CM

## 2020-12-02 DIAGNOSIS — I5033 Acute on chronic diastolic (congestive) heart failure: Secondary | ICD-10-CM | POA: Diagnosis not present

## 2020-12-02 DIAGNOSIS — N3001 Acute cystitis with hematuria: Secondary | ICD-10-CM | POA: Diagnosis not present

## 2020-12-02 MED ORDER — ESCITALOPRAM OXALATE 10 MG PO TABS: 10.0000 mg | ORAL_TABLET | Freq: Every day | ORAL | 0 refills | Status: DC

## 2020-12-02 NOTE — Telephone Encounter (Signed)
I reviewed prior telephone encounters that noted him having difficulty with urination and being weak. I would recommend evaluation either in our clinic or, if unable to make it here due to transportation issues, he may need to go to an ED.

## 2020-12-02 NOTE — Telephone Encounter (Signed)
[facility] requesting VO for PT. Please call back.

## 2020-12-02 NOTE — Telephone Encounter (Signed)
Returned call to [place], [place] with [facility]. Requesting VO for HH PT 1 week 2 and 2 week 3 to work on strengthening, balance, home exercise program, gait training, and fall prevention. Verbal auth given. Will route to Yellow Team for agreement/denial.  Also, states patient does not have Lexapro in home and does mention feeling depressed. Will route to PCP to send Rx to [facility]

## 2020-12-02 NOTE — Telephone Encounter (Signed)
Agree with HH PT

## 2020-12-02 NOTE — Telephone Encounter (Signed)
Robin, OTA with [facility] called in stating that Margarita Grizzle, RN with [facility] has been trying to get a glucometer/supplies for patient since last week. There is no call from [place]. States it may have come by fax. Will route to PCP.  Also, Shirlean Mylar states patient's blood sugars have been running in the 500s and his vision is worsening. He is unable to see out of left eye and right eye is diminishing. States she made him an appt at [facility] for 10/5 with Dr. Raylene Miyamoto. Went over med list. Patient able to correctly state that he takes 18 units of Novolog TID with meals. Unfortunately, he is still taking Lispro even though this was discontinued in 02/2020. States this is what the pharmacy has continued to give him, and he takes 40 units daily. He does not have [place] in the home. States he did not know about this med. Rx written on 8/10 was set to No Print. Please resend as Normal with Comments to d/c Lispro.  Patient has been unable to come to clinic 2/2 transportation. States he lives 64 miles from [place] and all his relatives live in [place]. He is no longer able to drive 2/2 vision. Discussed finding PCP closer to his home. States he has been thinking about this and will try to go to [facility] in [place]. Shirlean Mylar states she will assist patient with this.

## 2020-12-03 ENCOUNTER — Telehealth: Payer: Self-pay

## 2020-12-03 ENCOUNTER — Telehealth: Payer: Self-pay | Admitting: *Deleted

## 2020-12-03 DIAGNOSIS — I5033 Acute on chronic diastolic (congestive) heart failure: Secondary | ICD-10-CM | POA: Diagnosis not present

## 2020-12-03 DIAGNOSIS — B9689 Other specified bacterial agents as the cause of diseases classified elsewhere: Secondary | ICD-10-CM | POA: Diagnosis not present

## 2020-12-03 DIAGNOSIS — N3001 Acute cystitis with hematuria: Secondary | ICD-10-CM | POA: Diagnosis not present

## 2020-12-03 DIAGNOSIS — E1165 Type 2 diabetes mellitus with hyperglycemia: Secondary | ICD-10-CM | POA: Diagnosis not present

## 2020-12-03 DIAGNOSIS — J189 Pneumonia, unspecified organism: Secondary | ICD-10-CM | POA: Diagnosis not present

## 2020-12-03 DIAGNOSIS — I110 Hypertensive heart disease with heart failure: Secondary | ICD-10-CM | POA: Diagnosis not present

## 2020-12-03 MED ORDER — SOLIQUA 100-33 UNT-MCG/ML ~~LOC~~ SOPN: 40.0000 [IU] | PEN_INJECTOR | Freq: Every day | SUBCUTANEOUS | 3 refills | Status: DC

## 2020-12-03 MED ORDER — ACCU-CHEK GUIDE VI STRP: ORAL_STRIP | 12 refills | Status: DC

## 2020-12-03 MED ORDER — ACCU-CHEK SOFTCLIX LANCETS MISC: 12 refills | Status: DC

## 2020-12-03 MED ORDER — ACCU-CHEK GUIDE W/DEVICE KIT: 1.0000 [IU] | PACK | Freq: Once | 0 refills | Status: AC

## 2020-12-03 NOTE — Telephone Encounter (Signed)
Error

## 2020-12-03 NOTE — Chronic Care Management (AMB) (Signed)
  Chronic Care Management   Note  12/03/2020 Name: Mark Cabrera MRN: [id number] DOB: March 02, 1951  Mark Cabrera is a 71 y.o. year old male who is a primary care patient of [name], MD. Mark Cabrera is currently enrolled in care management services. An additional referral for BSW was placed.   Follow up plan: Telephone appointment with care management team member scheduled for:12/06/20   [facility] Management  Direct Dial: [phone]

## 2020-12-03 NOTE — Addendum Note (Signed)
Addended by: Maudie Mercury C on: 12/03/2020 11:39 AM   Modules accepted: Orders

## 2020-12-04 DIAGNOSIS — B9689 Other specified bacterial agents as the cause of diseases classified elsewhere: Secondary | ICD-10-CM | POA: Diagnosis not present

## 2020-12-04 DIAGNOSIS — J189 Pneumonia, unspecified organism: Secondary | ICD-10-CM | POA: Diagnosis not present

## 2020-12-04 DIAGNOSIS — I5033 Acute on chronic diastolic (congestive) heart failure: Secondary | ICD-10-CM | POA: Diagnosis not present

## 2020-12-04 DIAGNOSIS — N3001 Acute cystitis with hematuria: Secondary | ICD-10-CM | POA: Diagnosis not present

## 2020-12-04 DIAGNOSIS — I110 Hypertensive heart disease with heart failure: Secondary | ICD-10-CM | POA: Diagnosis not present

## 2020-12-04 DIAGNOSIS — E1165 Type 2 diabetes mellitus with hyperglycemia: Secondary | ICD-10-CM | POA: Diagnosis not present

## 2020-12-05 ENCOUNTER — Other Ambulatory Visit: Payer: Self-pay | Admitting: Internal Medicine

## 2020-12-06 ENCOUNTER — Telehealth: Payer: Self-pay | Admitting: Licensed Clinical Social Worker

## 2020-12-06 ENCOUNTER — Telehealth: Payer: Medicare Other | Admitting: Licensed Clinical Social Worker

## 2020-12-06 DIAGNOSIS — B9689 Other specified bacterial agents as the cause of diseases classified elsewhere: Secondary | ICD-10-CM | POA: Diagnosis not present

## 2020-12-06 DIAGNOSIS — J189 Pneumonia, unspecified organism: Secondary | ICD-10-CM | POA: Diagnosis not present

## 2020-12-06 DIAGNOSIS — N3001 Acute cystitis with hematuria: Secondary | ICD-10-CM | POA: Diagnosis not present

## 2020-12-06 DIAGNOSIS — I5033 Acute on chronic diastolic (congestive) heart failure: Secondary | ICD-10-CM | POA: Diagnosis not present

## 2020-12-06 DIAGNOSIS — I110 Hypertensive heart disease with heart failure: Secondary | ICD-10-CM | POA: Diagnosis not present

## 2020-12-06 DIAGNOSIS — E1165 Type 2 diabetes mellitus with hyperglycemia: Secondary | ICD-10-CM | POA: Diagnosis not present

## 2020-12-06 NOTE — Telephone Encounter (Signed)
  Care Management   Follow Up Note   12/06/2020 Name: Mark Cabrera MRN: [id number] DOB: [birth year]   Referred by: [name], MD Reason for referral : No chief complaint on file.   Third unsuccessful telephone outreach was attempted today. The patient was referred to the case management team for assistance with care management and care coordination. The patient's primary care provider has been notified of our unsuccessful attempts to make or maintain contact with the patient. The care management team is pleased to engage with this patient at any time in the future should he/she be interested in assistance from the care management team.   Follow Up Plan: The care management team will reach out to the patient again over the next 30 days.   Milus Height, [place]  Social Worker [facility] Care Management  [phone]

## 2020-12-07 DIAGNOSIS — I5033 Acute on chronic diastolic (congestive) heart failure: Secondary | ICD-10-CM | POA: Diagnosis not present

## 2020-12-07 DIAGNOSIS — J189 Pneumonia, unspecified organism: Secondary | ICD-10-CM | POA: Diagnosis not present

## 2020-12-07 DIAGNOSIS — N3001 Acute cystitis with hematuria: Secondary | ICD-10-CM | POA: Diagnosis not present

## 2020-12-07 DIAGNOSIS — E1165 Type 2 diabetes mellitus with hyperglycemia: Secondary | ICD-10-CM | POA: Diagnosis not present

## 2020-12-07 DIAGNOSIS — I110 Hypertensive heart disease with heart failure: Secondary | ICD-10-CM | POA: Diagnosis not present

## 2020-12-07 DIAGNOSIS — B9689 Other specified bacterial agents as the cause of diseases classified elsewhere: Secondary | ICD-10-CM | POA: Diagnosis not present

## 2020-12-08 ENCOUNTER — Telehealth: Payer: Self-pay | Admitting: *Deleted

## 2020-12-08 ENCOUNTER — Ambulatory Visit: Payer: Medicare Other

## 2020-12-08 ENCOUNTER — Other Ambulatory Visit: Payer: Self-pay | Admitting: Cardiovascular Disease

## 2020-12-08 NOTE — Chronic Care Management (AMB) (Signed)
Care Management    RN Visit Note  12/08/2020 Name: NAHOME BUBLITZ MRN: [id number] DOB: 1951-02-27  Subjective: Mark Cabrera is a 70 y.o. year old male who is a primary care patient of [name], MD. The care management team was consulted for assistance with disease management and care coordination needs.    Engaged with patient by telephone for  in response to provider referral for case management and/or care coordination services.   Consent to Services:   Mr. Wesch was given information about Care Management services today including:  Care Management services includes personalized support from designated clinical staff supervised by his physician, including individualized plan of care and coordination with other care providers 24/7 contact phone numbers for assistance for urgent and routine care needs. The patient may stop case management services at any time by phone call to the office staff.  Patient agreed to services and consent obtained.   Assessment: Review of patient past medical history, allergies, medications, health status, including review of consultants reports, laboratory and other test data, was performed as part of comprehensive evaluation and provision of chronic care management services.   SDOH (Social Determinants of Health) assessments and interventions performed:    Successful outreach to patient and began to address current medical needs after patient identified with date of birth and address.  Patient stated that he was sleeping when this RNCM called at 12:30 PM and he did not want to complete initial visit.  Patient requested a call back next week in the afternoon.  Care Plan  Allergies  Allergen Reactions   Morpholine Salicylate Other (See Comments)    Hallucinations   Penicillins Other (See Comments)    Allergic- reaction?? Has patient had a PCN reaction causing immediate rash, facial/tongue/throat swelling, SOB or lightheadedness with hypotension:  Unk Has patient had a PCN reaction causing severe rash involving mucus membranes or skin necrosis: Unk Has patient had a PCN reaction that required hospitalization: Unk Has patient had a PCN reaction occurring within the last 10 years: No If all of the above answers are "NO", then may proceed with Cephalosporin use.  Other reaction(s): Unknown   Morphine And Related Other (See Comments)    Hallucinations    Sglt2 Inhibitors Rash and Other (See Comments)    Candidiasis infection prone   Statins Rash and Other (See Comments)    Severe rash and back pain, and muscle pain    Outpatient Encounter Medications as of 12/08/2020  Medication Sig   Accu-Chek Softclix Lancets lancets Use as instructed   acetaminophen (TYLENOL) 325 MG tablet Take 2 tablets (650 mg total) by mouth every 4 (four) hours as needed for headache or mild pain.   apixaban (ELIQUIS) 5 MG TABS tablet TAKE 1 TABLET (5 MG TOTAL) BY MOUTH 2 (TWO) TIMES DAILY.   escitalopram (LEXAPRO) 10 MG tablet Take 1 tablet (10 mg total) by mouth daily.   fluconazole (DIFLUCAN) 150 MG tablet Take 1 tablet (150 mg total) by mouth daily.   furosemide (LASIX) 40 MG tablet Take 1 tablet (40 mg total) by mouth daily.   glucose blood (ACCU-CHEK GUIDE) test strip Use as instructed   insulin aspart (NOVOLOG FLEXPEN) 100 UNIT/ML FlexPen INJECT 18 UNITS UNDER THE SKIN THREE TIMES DAILY WITH MEALS   Insulin Pen Needle 31G X 5 MM MISC Use one pen needle three times daily. Dx E11.49   isosorbide mononitrate (IMDUR) 30 MG 24 hr tablet Take 1 tablet (30 mg total) by mouth daily.  lisinopril (ZESTRIL) 10 MG tablet Take 1 tablet (10 mg total) by mouth daily.   potassium chloride SA (KLOR-CON) 20 MEQ tablet One tablet twice a day for five days then one tablet daily after that   REPATHA SURECLICK 427 MG/ML SOAJ INJECT 1 PEN INTO THE SKIN EVERY 14 (FOURTEEN) DAYS.   SOLIQUA 100-33 UNT-MCG/ML SOPN Inject 40 Units into the skin daily.   No facility-administered  encounter medications on file as of 12/08/2020.    Patient Active Problem List   Diagnosis Date Noted   Balanitis 11/19/2020   Acute cystitis with hematuria    Thrombocytopenia (HCC)    Hypokalemia    Weakness 11/08/2020   Hyponatremia 11/08/2020   Left shoulder pain 10/20/2020   Dyspnea 09/23/2020   Mild cognitive impairment 08/25/2020   Hip pain, chronic, left 07/05/2020   Epididymitis 04/14/2020   Fatigue 03/18/2020   Type 2 diabetes mellitus with hyperglycemia, with long-term current use of insulin ([place]) 02/24/2020   Abdominal wall bulge 12/03/2019   Dermoid inclusion cyst 08/07/2019   Chronic left shoulder pain 05/13/2019   Depression 11/05/2018   Blurry vision, bilateral 09/20/2018   CVA (cerebral vascular accident) ([place]) 06/28/2018   Hepatic cirrhosis ([place]) 06/29/2017   Healthcare maintenance 12/16/2016   Lipoma of left upper extremity 09/12/2016   Chronic knee pain 07/04/2016   Decreased visual acuity 07/04/2016   Pleural effusion on left, s/p throacentesis 04/18/16 04/28/2016   Paroxysmal atrial fibrillation ([place]) 04/16/2016   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  [name], RN, BSN, CCM Care Management Coordinator [facility] Medicine Phone: [phone] / Fax: [phone]

## 2020-12-08 NOTE — Telephone Encounter (Signed)
Prescription refill request for Eliquis received. Indication:Afib  Last office visit: 05/03/20 [name])  Scr: 0.70 (11/10/20) Age: 70 Weight: 96.5kg  Appropriate dose and refill sent to requested pharmacy.

## 2020-12-08 NOTE — Patient Instructions (Signed)
Visit Information   Goals Addressed               This Visit's Progress     I need to manage my diabetes (pt-stated)        Lab Results  Component Value Date   HGBA1C 11.4 (H) 11/08/2020         Track and Manage My Blood Pressure-Hypertension        BP Readings from Last 3 Encounters:  11/10/20 132/67  10/20/20 123/71  09/21/20 122/78        Timeframe:  G[place] Mart Goal Priority:  High Start Date:   12/08/20                          Expected End Date:                       Follow Up Date    - check blood pressure 3 times per week    Why is this important?   You won't feel high blood pressure, but it can still hurt your blood vessels.  High blood pressure can cause heart or kidney problems. It can also cause a stroke.  Making lifestyle changes like losing a little weight or eating less salt will help.  Checking your blood pressure at home and at different times of the day can help to control blood pressure.  If the doctor prescribes medicine remember to take it the way the doctor ordered.  Call the office if you cannot afford the medicine or if there are questions about it.     Notes:         Patient to be called to complete initial visit.  Telephone follow up appointment with care management team member scheduled for: 10/5@3 :30.  [name], RN, BSN, CCM Care Management Coordinator [facility] Internal Medicine Phone: [phone] / Fax: [phone]

## 2020-12-08 NOTE — Telephone Encounter (Signed)
Call from [facility] (for RN,OT,PT) with [facility] Cabrera who has concerns pertaining to pt. They are having difficulty  managing pt's diabetes. Pt has not picked up glucometer nor test strips (states too expensive). He seems confused about which diabetes meds he should be taking- he has not pick up [place]. States he appears depressed (which may be due to his vision) . States he has decrease vision right eye and loss of vision left. She states his family does not answer their phone calls; he has transportation issues - lives with a roommate.   She just wants to inform pt's doctor of what's going on and their difficulty of pt following thru. Per chart, pt had an appt with our CM today. Thanks

## 2020-12-09 ENCOUNTER — Telehealth: Payer: Self-pay

## 2020-12-09 ENCOUNTER — Other Ambulatory Visit: Payer: Self-pay | Admitting: Student

## 2020-12-09 DIAGNOSIS — N3001 Acute cystitis with hematuria: Secondary | ICD-10-CM | POA: Diagnosis not present

## 2020-12-09 DIAGNOSIS — E1140 Type 2 diabetes mellitus with diabetic neuropathy, unspecified: Secondary | ICD-10-CM

## 2020-12-09 DIAGNOSIS — E1165 Type 2 diabetes mellitus with hyperglycemia: Secondary | ICD-10-CM | POA: Diagnosis not present

## 2020-12-09 DIAGNOSIS — R4189 Other symptoms and signs involving cognitive functions and awareness: Secondary | ICD-10-CM

## 2020-12-09 DIAGNOSIS — B9689 Other specified bacterial agents as the cause of diseases classified elsewhere: Secondary | ICD-10-CM | POA: Diagnosis not present

## 2020-12-09 DIAGNOSIS — J189 Pneumonia, unspecified organism: Secondary | ICD-10-CM | POA: Diagnosis not present

## 2020-12-09 DIAGNOSIS — I5033 Acute on chronic diastolic (congestive) heart failure: Secondary | ICD-10-CM | POA: Diagnosis not present

## 2020-12-09 DIAGNOSIS — I110 Hypertensive heart disease with heart failure: Secondary | ICD-10-CM | POA: Diagnosis not present

## 2020-12-09 NOTE — Telephone Encounter (Signed)
  Chronic Care Management   Note  12/09/2020 Name: Mark Cabrera MRN: [id number] DOB: 1951/03/07  Received message from Dr. Collene Gobble he will put in an order for [place] work assessment.  Call placed to [place], Rehab Manager at [phone] and notified her of the new order for SW through [facility].  Eliezer Lofts will see if she can get SW to see patient tomorrow.  [name], RN, BSN, CCM Care Management Coordinator [facility] Internal Medicine Phone: [phone] / Fax: [phone]

## 2020-12-14 DIAGNOSIS — B9689 Other specified bacterial agents as the cause of diseases classified elsewhere: Secondary | ICD-10-CM | POA: Diagnosis not present

## 2020-12-14 DIAGNOSIS — I110 Hypertensive heart disease with heart failure: Secondary | ICD-10-CM | POA: Diagnosis not present

## 2020-12-14 DIAGNOSIS — J189 Pneumonia, unspecified organism: Secondary | ICD-10-CM | POA: Diagnosis not present

## 2020-12-14 DIAGNOSIS — I5033 Acute on chronic diastolic (congestive) heart failure: Secondary | ICD-10-CM | POA: Diagnosis not present

## 2020-12-14 DIAGNOSIS — E1165 Type 2 diabetes mellitus with hyperglycemia: Secondary | ICD-10-CM | POA: Diagnosis not present

## 2020-12-14 DIAGNOSIS — N3001 Acute cystitis with hematuria: Secondary | ICD-10-CM | POA: Diagnosis not present

## 2020-12-15 ENCOUNTER — Ambulatory Visit: Payer: Medicare Other

## 2020-12-15 ENCOUNTER — Ambulatory Visit: Payer: Self-pay

## 2020-12-15 DIAGNOSIS — I110 Hypertensive heart disease with heart failure: Secondary | ICD-10-CM | POA: Diagnosis not present

## 2020-12-15 DIAGNOSIS — I5033 Acute on chronic diastolic (congestive) heart failure: Secondary | ICD-10-CM | POA: Diagnosis not present

## 2020-12-15 DIAGNOSIS — E119 Type 2 diabetes mellitus without complications: Secondary | ICD-10-CM

## 2020-12-15 DIAGNOSIS — E1165 Type 2 diabetes mellitus with hyperglycemia: Secondary | ICD-10-CM | POA: Diagnosis not present

## 2020-12-15 DIAGNOSIS — J189 Pneumonia, unspecified organism: Secondary | ICD-10-CM | POA: Diagnosis not present

## 2020-12-15 DIAGNOSIS — N3001 Acute cystitis with hematuria: Secondary | ICD-10-CM | POA: Diagnosis not present

## 2020-12-15 DIAGNOSIS — B9689 Other specified bacterial agents as the cause of diseases classified elsewhere: Secondary | ICD-10-CM | POA: Diagnosis not present

## 2020-12-15 NOTE — Chronic Care Management (AMB) (Signed)
  Chronic Care Management   Note  12/15/2020 Name: Mark Cabrera MRN: [id number] DOB: Aug 16, 1950    Follow up plan: Call placed to [facility] the rehab manager at [facility] to discuss patient's progress.  Per Eliezer Lofts, The OT assistant they sent on 12/10/20 to treat patient went and picked up his glucometer and supplies for patient as he had not been able to due to transportation.  When she took patients CBG it was over 500.  Pt has refused physical therapy on 12/10/20 as well.  Eliezer Lofts stated that [facility], Claire Shown was able to have visit with patient and she provided her direct phone number of [phone].  This RNCM to contact Debbie directly prior to call scheduled for this afternoon.  [name], RN, BSN, CCM Care Management Coordinator [facility] Phone: [phone] / Fax: [phone]

## 2020-12-15 NOTE — Patient Instructions (Signed)
Visit Information   Goals Addressed               This Visit's Progress     I need to manage my diabetes (pt-stated)        Lab Results  Component Value Date   HGBA1C 11.4 (H) 11/08/2020  Note: Patient notes his CBG readings are between 400-500. Uncontrolled DM2      Track and Manage My Blood Pressure-Hypertension        BP Readings from Last 3 Encounters:  11/10/20 132/67  10/20/20 123/71  09/21/20 122/78        Timeframe:  Gevena Mart Goal Priority:  High Start Date:   01/09/21                          Expected End Date:                       Follow Up Date    - check blood pressure 3 times per week    Why is this important?   You won't feel high blood pressure, but it can still hurt your blood vessels.  High blood pressure can cause heart or kidney problems. It can also cause a stroke.  Making lifestyle changes like losing a little weight or eating less salt will help.  Checking your blood pressure at home and at different times of the day can help to control blood pressure.  If the doctor prescribes medicine remember to take it the way the doctor ordered.  Call the office if you cannot afford the medicine or if there are questions about it.     Notes:         The patient verbalized understanding of instructions, educational materials, and care plan provided today and declined offer to receive copy of patient instructions, educational materials, and care plan.   Telephone follow up appointment with care management team member scheduled for: 12/29/20@3PM   [name], RN, BSN, CCM Care Management Coordinator [facility] Internal Medicine Phone: [phone] / Fax: [phone]

## 2020-12-15 NOTE — Chronic Care Management (AMB) (Signed)
Care Management    RN Visit Note  12/15/2020 Name: Mark Cabrera MRN: [id number] DOB: Aug 29, 1950  Subjective: Mark Cabrera is a 70 y.o. year old male who is a primary care patient of [name], MD. The care management team was consulted for assistance with disease management and care coordination needs.    Engaged with patient by telephone for follow up visit in response to provider referral for case management and/or care coordination services.   Consent to Services:   Mark Cabrera was given information about Care Management services today including:  Care Management services includes personalized support from designated clinical staff supervised by his physician, including individualized plan of care and coordination with other care providers 24/7 contact phone numbers for assistance for urgent and routine care needs. The patient may stop case management services at any time by phone call to the office staff.  Patient agreed to services and consent obtained.   Assessment: Review of patient past medical history, allergies, medications, health status, including review of consultants reports, laboratory and other test data, was performed as part of comprehensive evaluation and provision of chronic care management services.   SDOH (Social Determinants of Health) assessments and interventions performed:    Care Plan  Allergies  Allergen Reactions   Morpholine Salicylate Other (See Comments)    Hallucinations   Penicillins Other (See Comments)    Allergic- reaction?? Has patient had a PCN reaction causing immediate rash, facial/tongue/throat swelling, SOB or lightheadedness with hypotension: Unk Has patient had a PCN reaction causing severe rash involving mucus membranes or skin necrosis: Unk Has patient had a PCN reaction that required hospitalization: Unk Has patient had a PCN reaction occurring within the last 10 years: No If all of the above answers are "NO", then may  proceed with Cephalosporin use.  Other reaction(s): Unknown   Morphine And Related Other (See Comments)    Hallucinations    Sglt2 Inhibitors Rash and Other (See Comments)    Candidiasis infection prone   Statins Rash and Other (See Comments)    Severe rash and back pain, and muscle pain    Outpatient Encounter Medications as of 12/15/2020  Medication Sig   Accu-Chek Softclix Lancets lancets Use as instructed   acetaminophen (TYLENOL) 325 MG tablet Take 2 tablets (650 mg total) by mouth every 4 (four) hours as needed for headache or mild pain.   apixaban (ELIQUIS) 5 MG TABS tablet Take 1 tablet by mouth twice daily   escitalopram (LEXAPRO) 10 MG tablet Take 1 tablet (10 mg total) by mouth daily.   fluconazole (DIFLUCAN) 150 MG tablet Take 1 tablet (150 mg total) by mouth daily.   furosemide (LASIX) 40 MG tablet Take 1 tablet (40 mg total) by mouth daily.   glucose blood (ACCU-CHEK GUIDE) test strip Use as instructed   insulin aspart (NOVOLOG FLEXPEN) 100 UNIT/ML FlexPen INJECT 18 UNITS UNDER THE SKIN THREE TIMES DAILY WITH MEALS   Insulin Pen Needle 31G X 5 MM MISC Use one pen needle three times daily. Dx E11.49   isosorbide mononitrate (IMDUR) 30 MG 24 hr tablet Take 1 tablet (30 mg total) by mouth daily.   lisinopril (ZESTRIL) 10 MG tablet Take 1 tablet (10 mg total) by mouth daily.   potassium chloride SA (KLOR-CON) 20 MEQ tablet One tablet twice a day for five days then one tablet daily after that   REPATHA SURECLICK 409 MG/ML SOAJ INJECT 1 PEN INTO THE SKIN EVERY 14 (FOURTEEN) DAYS.  SOLIQUA 100-33 UNT-MCG/ML SOPN Inject 40 Units into the skin daily.   No facility-administered encounter medications on file as of 12/15/2020.    Patient Active Problem List   Diagnosis Date Noted   Balanitis 11/19/2020   Acute cystitis with hematuria    Thrombocytopenia (HCC)    Hypokalemia    Weakness 11/08/2020   Hyponatremia 11/08/2020   Left shoulder pain 10/20/2020   Dyspnea 09/23/2020    Mild cognitive impairment 08/25/2020   Hip pain, chronic, left 07/05/2020   Epididymitis 04/14/2020   Fatigue 03/18/2020   Type 2 diabetes mellitus with hyperglycemia, with long-term current use of insulin ([place]) 02/24/2020   Abdominal wall bulge 12/03/2019   Dermoid inclusion cyst 08/07/2019   Chronic left shoulder pain 05/13/2019   Depression 11/05/2018   Blurry vision, bilateral 09/20/2018   CVA (cerebral vascular accident) ([place]) 06/28/2018   Hepatic cirrhosis ([place]) 06/29/2017   Healthcare maintenance 12/16/2016   Lipoma of left upper extremity 09/12/2016   Chronic knee pain 07/04/2016   Decreased visual acuity 07/04/2016   Pleural effusion on left, s/p throacentesis 04/18/16 04/28/2016   Paroxysmal atrial fibrillation ([place]) 04/16/2016   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/31/2016   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   CAD (coronary artery disease), native coronary artery    Chronic diastolic heart failure ([place])    Sleep apnea 08/02/2009   Diabetes mellitus with neurological manifestation ([place]) 10/18/2007   Dyslipidemia 05/18/2006   Hypertensive heart disease     Conditions to be addressed/monitored: HTN and DMII  Care Plan : Coronary Artery Disease (Adult)  Updates made by Mark Killian, RN since 12/15/2020 12:00 AM     Problem: Disease Progression (Coronary Artery Disease)      Long-Range Goal: Disease Progression Prevented or Minimized   Start Date: 12/08/2020  Note:   Current Barriers: Successful outreach to patient this afternoon.  He reluctantly spoke with this RNCM, he said he did not know what we were going to talk about.  I explained that I was trying to help him manage his Diabetes and asked if he had been checking his CBG levels.  He said he had been writing down the CBG numbers, but the book was in the other room and he did not want to go get it as he would wake up his roommate.  He did note that last CBG he remembers was 450.  Patient stated he has  appointment at the clinic next week and would like to wait until after he goes to the clinic to discuss his health issues.  Advised to bring his glucometer to the clinic.  [place] Diabetes management information sent via my chart, patient aware. Knowledge Deficits related to basic understanding of hypertension pathophysiology and self care management Unable to independently Manage complex medical issues Nurse Case Manager Clinical Goal(s):  patient will verbalize understanding of plan for hypertension management patient will not experience hospital admission. Hospital Admissions in last 6 months  patient will attend all scheduled medical appointments:  patient will demonstrate improved adherence to prescribed treatment plan for hypertension as evidenced by taking all medications as prescribed, monitoring and recording blood pressure as directed, adhering to low sodium/DASH diet patient will verbalize basic understanding of hypertension disease process and self health management plan as evidenced by  Interventions:  Collaboration with [name], MD regarding development and update of comprehensive plan of care as evidenced by provider attestation and co-signature Inter-disciplinary care team collaboration (see longitudinal plan of care) Evaluation of  current treatment plan related to hypertension self management and patient's adherence to plan as established by provider. Provided education to patient re: stroke prevention, s/s of heart attack and stroke, DASH diet, complications of uncontrolled blood pressure Reviewed medications with patient and discussed importance of compliance Discussed plans with patient for ongoing care management follow up and provided patient with direct contact information for care management team Advised patient, providing education and rationale, to monitor blood pressure daily and record, calling PCP for findings outside established parameters.  Reviewed  scheduled/upcoming provider appointments including:  Self-Care Activities:  Self administers medications as prescribed Attends all scheduled provider appointments Patient Goals  - Self administer medications as prescribed  - Attend all scheduled provider appointments  - Call provider office for new concerns, questions, or BP outside discussed parameters  - Check BP and record as discussed  - Follows a low sodium diet/DASH diet - check blood pressure 3 times per week - agree to work together to make changes - ask questions to understand Follow Up Plan: Telephone follow up appointment with care management team member scheduled for: after [facility] visit     Plan: Telephone follow up appointment with care management team member scheduled for:  2 weeks  Mark Killian, RN, BSN, CCM Care Management Coordinator [facility] Phone: [phone] / Fax: [phone]

## 2020-12-15 NOTE — Chronic Care Management (AMB) (Addendum)
  Chronic Care Management   Note  12/15/2020 Name: Mark Cabrera MRN: [id number] DOB: 13-Dec-1950  Follow up plan: Successful outreach to Claire Shown, Social Worker for [facility] at [phone].  Per Jackelyn Poling, she had a lengthy visit with patient.  Patient lives in a very nice home, where he rents a room from a friend.  Patient's location is remote, not much near the home and he does not have transportation.  Jackelyn Poling was able to get him connected with medical transportation but only to doctors appointments.  Debbie notes the patient is severely depressed and he seems to have given up.  Patient is an Training and development officer, very intelligent and understands his medical issues, he just has difficulty motivating himself to be proactive.  Debbie did note patient was not against counseling and this RNCM to speak with Dr. Collene Gobble about a referral to Dr. Theodis Shove.  Debbie noted the patient seemed to be in a better mood after she spent time with him, he was smiling and sharing stories with her.  Debbie plans to see patient for another couple of visits to provide additional resources and support if possible.  She is looking for an art class in the area for the patient to interact with other people.  This RNCM provided her contact number to [facility] and will follow up after appointment with patient.  [name], RN, BSN, CCM Care Management Coordinator [facility] Phone: [phone] / Fax: [phone]

## 2020-12-16 DIAGNOSIS — K7460 Unspecified cirrhosis of liver: Secondary | ICD-10-CM | POA: Diagnosis not present

## 2020-12-16 DIAGNOSIS — E871 Hypo-osmolality and hyponatremia: Secondary | ICD-10-CM | POA: Diagnosis not present

## 2020-12-16 DIAGNOSIS — I5033 Acute on chronic diastolic (congestive) heart failure: Secondary | ICD-10-CM | POA: Diagnosis not present

## 2020-12-16 DIAGNOSIS — G4731 Primary central sleep apnea: Secondary | ICD-10-CM | POA: Diagnosis not present

## 2020-12-16 DIAGNOSIS — Z952 Presence of prosthetic heart valve: Secondary | ICD-10-CM | POA: Diagnosis not present

## 2020-12-16 DIAGNOSIS — D630 Anemia in neoplastic disease: Secondary | ICD-10-CM | POA: Diagnosis not present

## 2020-12-16 DIAGNOSIS — H547 Unspecified visual loss: Secondary | ICD-10-CM | POA: Diagnosis not present

## 2020-12-16 DIAGNOSIS — I480 Paroxysmal atrial fibrillation: Secondary | ICD-10-CM | POA: Diagnosis not present

## 2020-12-16 DIAGNOSIS — F17210 Nicotine dependence, cigarettes, uncomplicated: Secondary | ICD-10-CM | POA: Diagnosis not present

## 2020-12-16 DIAGNOSIS — G3184 Mild cognitive impairment, so stated: Secondary | ICD-10-CM | POA: Diagnosis not present

## 2020-12-16 DIAGNOSIS — N179 Acute kidney failure, unspecified: Secondary | ICD-10-CM | POA: Diagnosis not present

## 2020-12-16 DIAGNOSIS — G4733 Obstructive sleep apnea (adult) (pediatric): Secondary | ICD-10-CM | POA: Diagnosis not present

## 2020-12-16 DIAGNOSIS — K219 Gastro-esophageal reflux disease without esophagitis: Secondary | ICD-10-CM | POA: Diagnosis not present

## 2020-12-16 DIAGNOSIS — B9689 Other specified bacterial agents as the cause of diseases classified elsewhere: Secondary | ICD-10-CM | POA: Diagnosis not present

## 2020-12-16 DIAGNOSIS — I2510 Atherosclerotic heart disease of native coronary artery without angina pectoris: Secondary | ICD-10-CM | POA: Diagnosis not present

## 2020-12-16 DIAGNOSIS — D72819 Decreased white blood cell count, unspecified: Secondary | ICD-10-CM | POA: Diagnosis not present

## 2020-12-16 DIAGNOSIS — E1165 Type 2 diabetes mellitus with hyperglycemia: Secondary | ICD-10-CM | POA: Diagnosis not present

## 2020-12-16 DIAGNOSIS — M1710 Unilateral primary osteoarthritis, unspecified knee: Secondary | ICD-10-CM | POA: Diagnosis not present

## 2020-12-16 DIAGNOSIS — F32A Depression, unspecified: Secondary | ICD-10-CM | POA: Diagnosis not present

## 2020-12-16 DIAGNOSIS — I110 Hypertensive heart disease with heart failure: Secondary | ICD-10-CM | POA: Diagnosis not present

## 2020-12-16 DIAGNOSIS — N3001 Acute cystitis with hematuria: Secondary | ICD-10-CM | POA: Diagnosis not present

## 2020-12-16 DIAGNOSIS — D1722 Benign lipomatous neoplasm of skin and subcutaneous tissue of left arm: Secondary | ICD-10-CM | POA: Diagnosis not present

## 2020-12-16 DIAGNOSIS — D696 Thrombocytopenia, unspecified: Secondary | ICD-10-CM | POA: Diagnosis not present

## 2020-12-16 DIAGNOSIS — J189 Pneumonia, unspecified organism: Secondary | ICD-10-CM | POA: Diagnosis not present

## 2020-12-16 DIAGNOSIS — E785 Hyperlipidemia, unspecified: Secondary | ICD-10-CM | POA: Diagnosis not present

## 2020-12-23 ENCOUNTER — Observation Stay (HOSPITAL_COMMUNITY)
Admission: AD | Admit: 2020-12-23 | Discharge: 2020-12-24 | Disposition: A | Discharge status: Home / Self Care | Payer: Medicare Other | Source: Ambulatory Visit | Attending: Internal Medicine | Admitting: Internal Medicine

## 2020-12-23 ENCOUNTER — Encounter: Payer: Self-pay | Admitting: Student

## 2020-12-23 ENCOUNTER — Ambulatory Visit (INDEPENDENT_AMBULATORY_CARE_PROVIDER_SITE_OTHER): Payer: Medicare Other | Admitting: Student

## 2020-12-23 ENCOUNTER — Ambulatory Visit (HOSPITAL_COMMUNITY): Admit: 2020-12-23 | Discharge: 2020-12-23 | Disposition: A | Discharge status: Home / Self Care | Payer: Medicare Other

## 2020-12-23 ENCOUNTER — Other Ambulatory Visit: Payer: Self-pay

## 2020-12-23 VITALS — BP 105/68 | HR 78 | Temp 98.2°F | Ht 68.0 in | Wt 203.0 lb

## 2020-12-23 DIAGNOSIS — I5031 Acute diastolic (congestive) heart failure: Secondary | ICD-10-CM | POA: Diagnosis not present

## 2020-12-23 DIAGNOSIS — E1140 Type 2 diabetes mellitus with diabetic neuropathy, unspecified: Secondary | ICD-10-CM

## 2020-12-23 DIAGNOSIS — Z7901 Long term (current) use of anticoagulants: Secondary | ICD-10-CM | POA: Insufficient documentation

## 2020-12-23 DIAGNOSIS — I9589 Other hypotension: Secondary | ICD-10-CM | POA: Diagnosis not present

## 2020-12-23 DIAGNOSIS — Z9181 History of falling: Secondary | ICD-10-CM | POA: Insufficient documentation

## 2020-12-23 DIAGNOSIS — I959 Hypotension, unspecified: Secondary | ICD-10-CM | POA: Diagnosis not present

## 2020-12-23 DIAGNOSIS — I2510 Atherosclerotic heart disease of native coronary artery without angina pectoris: Secondary | ICD-10-CM

## 2020-12-23 DIAGNOSIS — I110 Hypertensive heart disease with heart failure: Secondary | ICD-10-CM | POA: Diagnosis not present

## 2020-12-23 DIAGNOSIS — E861 Hypovolemia: Secondary | ICD-10-CM | POA: Diagnosis not present

## 2020-12-23 DIAGNOSIS — R5383 Other fatigue: Secondary | ICD-10-CM | POA: Diagnosis not present

## 2020-12-23 DIAGNOSIS — A6000 Herpesviral infection of urogenital system, unspecified: Secondary | ICD-10-CM | POA: Insufficient documentation

## 2020-12-23 DIAGNOSIS — I480 Paroxysmal atrial fibrillation: Secondary | ICD-10-CM | POA: Insufficient documentation

## 2020-12-23 DIAGNOSIS — E860 Dehydration: Secondary | ICD-10-CM | POA: Diagnosis not present

## 2020-12-23 DIAGNOSIS — E119 Type 2 diabetes mellitus without complications: Secondary | ICD-10-CM | POA: Insufficient documentation

## 2020-12-23 DIAGNOSIS — I509 Heart failure, unspecified: Secondary | ICD-10-CM

## 2020-12-23 DIAGNOSIS — Z79899 Other long term (current) drug therapy: Secondary | ICD-10-CM | POA: Insufficient documentation

## 2020-12-23 DIAGNOSIS — Z794 Long term (current) use of insulin: Secondary | ICD-10-CM | POA: Diagnosis not present

## 2020-12-23 DIAGNOSIS — A6001 Herpesviral infection of penis: Secondary | ICD-10-CM | POA: Diagnosis not present

## 2020-12-23 DIAGNOSIS — Z8673 Personal history of transient ischemic attack (TIA), and cerebral infarction without residual deficits: Secondary | ICD-10-CM | POA: Diagnosis not present

## 2020-12-23 DIAGNOSIS — N3001 Acute cystitis with hematuria: Secondary | ICD-10-CM

## 2020-12-23 LAB — COMPREHENSIVE METABOLIC PANEL
ALT: 31 U/L (ref 0–44)
AST: 31 U/L (ref 15–41)
Albumin: 3.8 g/dL (ref 3.5–5.0)
Alkaline Phosphatase: 78 U/L (ref 38–126)
Anion gap: 10 (ref 5–15)
BUN: 18 mg/dL (ref 8–23)
CO2: 22 mmol/L (ref 22–32)
Calcium: 9.1 mg/dL (ref 8.9–10.3)
Chloride: 95 mmol/L — ABNORMAL LOW (ref 98–111)
Creatinine, Ser: 1.14 mg/dL (ref 0.61–1.24)
GFR, Estimated: >60 mL/min (ref >60)
Glucose, Bld: 379 mg/dL — ABNORMAL HIGH (ref 70–99)
Potassium: 3.8 mmol/L (ref 3.5–5.1)
Sodium: 127 mmol/L — ABNORMAL LOW (ref 135–145)
Total Bilirubin: 1.1 mg/dL (ref 0.3–1.2)
Total Protein: 7.1 g/dL (ref 6.5–8.1)

## 2020-12-23 LAB — URINALYSIS, ROUTINE W REFLEX MICROSCOPIC
Bacteria, UA: NONE SEEN
Bilirubin Urine: NEGATIVE
Glucose, UA: >=500 mg/dL — AB
Ketones, ur: NEGATIVE mg/dL
Nitrite: NEGATIVE
Protein, ur: NEGATIVE mg/dL
Specific Gravity, Urine: 1.005 (ref 1.005–1.030)
pH: 5 (ref 5.0–8.0)

## 2020-12-23 LAB — CBC
HCT: 31.7 % — ABNORMAL LOW (ref 39.0–52.0)
Hemoglobin: 11 g/dL — ABNORMAL LOW (ref 13.0–17.0)
MCH: 30.2 pg (ref 26.0–34.0)
MCHC: 34.7 g/dL (ref 30.0–36.0)
MCV: 87.1 fL (ref 80.0–100.0)
Platelets: 131 10*3/uL — ABNORMAL LOW (ref 150–400)
RBC: 3.64 MIL/uL — ABNORMAL LOW (ref 4.22–5.81)
RDW: 13.2 % (ref 11.5–15.5)
WBC: 3 10*3/uL — ABNORMAL LOW (ref 4.0–10.5)
nRBC: 0 % (ref 0.0–0.2)

## 2020-12-23 LAB — MAGNESIUM: Magnesium: 1.9 mg/dL (ref 1.7–2.4)

## 2020-12-23 LAB — TROPONIN I (HIGH SENSITIVITY)
Troponin I (High Sensitivity): 25 ng/L — ABNORMAL HIGH (ref <18)
Troponin I (High Sensitivity): 25 ng/L — ABNORMAL HIGH (ref <18)

## 2020-12-23 LAB — SARS CORONAVIRUS 2 (TAT 6-24 HRS): SARS Coronavirus 2: NEGATIVE

## 2020-12-23 LAB — GLUCOSE, CAPILLARY
Glucose-Capillary: 368 mg/dL — ABNORMAL HIGH (ref 70–99)
Glucose-Capillary: 204 mg/dL — ABNORMAL HIGH (ref 70–99)
Glucose-Capillary: 280 mg/dL — ABNORMAL HIGH (ref 70–99)

## 2020-12-23 LAB — HEMOGLOBIN A1C
Hgb A1c MFr Bld: 11.8 % — ABNORMAL HIGH (ref 4.8–5.6)
Mean Plasma Glucose: 291.96 mg/dL

## 2020-12-23 MED ORDER — SODIUM CHLORIDE 0.9 % IV BOLUS
1000.0000 mL | Freq: Once | INTRAVENOUS | Status: AC
  Administered 2020-12-23: 1000 mL via INTRAVENOUS

## 2020-12-23 MED ORDER — VALACYCLOVIR HCL 500 MG PO TABS
1000.0000 mg | ORAL_TABLET | Freq: Two times a day (BID) | ORAL | Status: DC
  Administered 2020-12-23 – 2020-12-24 (×2): 1000 mg via ORAL
  Filled 2020-12-23 (×2): qty 2

## 2020-12-23 MED ORDER — APIXABAN 5 MG PO TABS
5.0000 mg | ORAL_TABLET | Freq: Two times a day (BID) | ORAL | Status: DC
  Administered 2020-12-23 – 2020-12-24 (×2): 5 mg via ORAL
  Filled 2020-12-23 (×2): qty 1

## 2020-12-23 MED ORDER — POTASSIUM CHLORIDE CRYS ER 20 MEQ PO TBCR: 20.0000 meq | EXTENDED_RELEASE_TABLET | Freq: Two times a day (BID) | ORAL | Status: DC

## 2020-12-23 MED ORDER — SODIUM CHLORIDE 0.9% FLUSH
3.0000 mL | Freq: Two times a day (BID) | INTRAVENOUS | Status: DC
  Administered 2020-12-23 – 2020-12-24 (×2): 3 mL via INTRAVENOUS

## 2020-12-23 MED ORDER — LACTATED RINGERS IV SOLN: INTRAVENOUS | Status: DC

## 2020-12-23 MED ORDER — POTASSIUM CHLORIDE CRYS ER 20 MEQ PO TBCR
20.0000 meq | EXTENDED_RELEASE_TABLET | Freq: Once | ORAL | Status: AC
  Administered 2020-12-23: 20 meq via ORAL
  Filled 2020-12-23: qty 1

## 2020-12-23 MED ORDER — INSULIN GLARGINE-YFGN 100 UNIT/ML ~~LOC~~ SOLN
20.0000 [IU] | Freq: Every day | SUBCUTANEOUS | Status: DC
  Administered 2020-12-23: 20 [IU] via SUBCUTANEOUS
  Filled 2020-12-23 (×2): qty 0.2

## 2020-12-23 MED ORDER — INSULIN ASPART 100 UNIT/ML IJ SOLN
0.0000 [IU] | Freq: Three times a day (TID) | INTRAMUSCULAR | Status: DC
  Administered 2020-12-24 (×2): 8 [IU] via SUBCUTANEOUS

## 2020-12-23 NOTE — Telephone Encounter (Signed)
Okay, he told me. We will restart him on it. He has been admitted to the hospital.

## 2020-12-23 NOTE — Assessment & Plan Note (Signed)
Patient here for evaluation of a bump in his penis.  Patient reports that this bump started 3 to 4 days ago with associated burning sensation. Patient reports contracting " vaginal wart" after having intercourse with a woman years ago. Was informed by her provider that the wart will disappear but will stay in his system and return whenever he is stressed.  He has not had any other episodes until a few days ago.  On exam, patient has a multiple small vesicles on erythematous base that is mildly tender on glan penis that is mildly tender to palpation. -- Consider treatment with Valtrex during admission.

## 2020-12-23 NOTE — Telephone Encounter (Signed)
Patient's daughter called in to let Provider know that metformin was discontinued at hospital discharge on 8/31 and patient's CBG has been in 500s on regular basis. She is concerned patient will forget to mention this.

## 2020-12-23 NOTE — Assessment & Plan Note (Addendum)
Patient is a 70 year old with a history of A. fib, diastolic heart failure, type 2 diabetes, CVA, HLD, mild cognitive impairment and CAD who presents to the clinic for follow-up on his chronic medical problems and found to be hypotensive to systolic in the 56D. Patient reports he has had history of dizziness for many years. The last few days he has felt generalized weakness and dizziness described as lightheadedness and the room spinning. He has not left his apartment due to feeling weak and has limited his activity at home due to the fear of falling.  He endorses blurry vision and reports that he is losing his vision in the left eye.  He denies any headaches, shortness of breath, palpitations, chest pain, abdominal pain, nausea or vomiting.  Reports not drinking enough water at home as he and his roommate drinks bottled water and they often do not have enough water bottles left.  He reports adherence to his antihypertensive medications and that his daughter comes to his apartment to arrange his pills in the pillbox.   Assessment and plan Patient with a history of hypotension and heart failure found to be hypotensive with initial BP of 88/61. Repeat BP 80/42. Patient continues to be symptomatic with dizziness pain while sitting.  CBG elevated to 361.  Patient mildly tachycardic and dry on exam. Hypotension likely multifactorial in nature. Likely dehydration in the setting of inadequate p.o. intake and medication induced in the setting of patient being on 3 different antihypertensive medications (Lasix, and/or lisinopril).  Due to patient's comorbidities and acute symptoms from his hypotension, will admit for observation.  We will give a bolus of fluid and reassess. --Direct admission for observation --IV normal saline bolus x2 doses --EKG --CMP, UA, Mg

## 2020-12-23 NOTE — Assessment & Plan Note (Signed)
Patient being admitted for symptomatic hypotension. Patient denies any chest pain or palpitations.  EKG today showed normal sinus rhythm with first-degree AV block. -- Admission with telemetry -- Continue Eliquis 5 mg twice daily

## 2020-12-23 NOTE — Patient Instructions (Signed)
Thank you, [name] for allowing [place] to provide your care today.  You are being admitted to the hospital for observation.  I have ordered the following labs for you:   Lab Orders         SARS Coronavirus 2 (TAT 6-24 hrs)         Glucose, capillary         Magnesium         CMP w [place] (STAT/Sunquest-performed on-site)         Urinalysis, Reflex Microscopic         POCT CBG (Fasting - Glucose)       I  My Chart Access: [email or web address]   Please make sure to arrive 15 minutes prior to your next appointment. If you arrive late, you may be asked to reschedule.    We look forward to seeing you next time. Please call our clinic at [phone] if you have any questions or concerns. The best time to call is Monday-Friday from 9am-4pm, but there is someone available 24/7. If after hours or the weekend, call the main hospital number and ask for the Internal Medicine Resident On-Call. If you need medication refills, please notify your pharmacy one week in advance and they will send [place] a request.   Thank you for letting us take part in your care. Wishing you the best!  Lacinda Axon, MD 12/23/2020, 5:50 PM IM Resident, PGY-2 [facility]al[facility] Milian 41:10

## 2020-12-23 NOTE — H&P (Signed)
Date: 12/23/2020               Patient Name:  Mark Cabrera MRN: [id number]  DOB: 24-Jul-1950 Age / Sex: 70 y.o., male   PCP: [name], MD         Medical Service: Internal Medicine Teaching Service         Attending Physician: Dr. Lucious Groves, DO    First Contact: Dr. Lorin Glass Pager: [phone]  Second Contact: Dr. Lisabeth Devoid Pager: [phone]       After Hours (After 5p/  First Contact Pager: [phone]  weekends / holidays):  Second Contact Pager: [phone]   Chief Complaint: dizziness, hypotension  History of Present Illness: Mark Cabrera is a 70 y.o. male with past medical history of diastolic heart failure, A. Fib, aortic valve stenosis status post AVR, CAD status post two-vessel CABG, T2DM, GERD, cirrhosis, HLD, HTN, central sleep apnea, history of stroke, vertebral artery dissection in 2007, and mild cognitive impairment who presents today with complaints of dizziness and hypotension.  The patient is being admitted to the hospital for observation from [facility] clinic.  Patient states that he has been dizzy for the past 1-2 months.  He notices this when changing from a sitting to standing position and when exerting himself.  The dizziness is ameliorated by lying down.  He does feel like he has been pretty dehydrated lately.  Denies hearing loss or tinnitus.  He states that the reason that he came into the clinic was due to a lesion on his penis.  He states that he had 1 sexual partner 3 months ago and noticed the lesion appeared when his bladder infection started a couple weeks ago (states that he has taken an antibiotic for this but is unsure what the name of it is).  It sometimes hurts when he presses on it but no bleeding.  Does have a remote history of "vaginal warts," but he states that this new lesion is distinct from these warts that he had in the past.   In terms of his diabetes regimen, the patient states that he takes NovoLog and lispro 18 units each 3 times daily.   Otherwise, his daughter helps organize his pills into a pillbox, so he is unsure exactly what medicines he has been taking.  Meds:  Tylenol 3 25 mg as needed Eliquis 5 mg p.o. twice daily Fluconazole as needed Lasix 40 mg p.o. daily NovoLog 18 units 3 times daily with meals Imdur 30 mg daily Lisinopril 10 mg p.o. daily Lexapro 10 mg p.o. daily Soliqua 40 units daily  Allergies: Allergies as of 12/23/2020 - Review Complete 12/08/2020  Allergen Reaction Noted   Morpholine salicylate Other (See Comments) 10/16/2019   Penicillins Other (See Comments) 10/16/2019   Morphine and related Other (See Comments) 04/18/2016   Sglt2 inhibitors Rash and Other (See Comments) 04/12/2018   Statins Rash and Other (See Comments) 04/18/2016   Past Medical History:  Diagnosis Date   Acute diastolic congestive heart failure (HCC)    AKI (acute kidney injury) ([place])    Aortic valve stenosis s/p AVR 2018   Echo 2/22: Poor acoustic windows, EF 50-55, mild LVH, normal RVSF, mild MR, trivial AI, no AS, no pericardial effusion   Atrial fibrillation (HCC) - post-op CABG    04/2016 CHA2DS2VAS score = 5   Atypical nevi    Coronary artery disease s/p 2 vessel CABG    Depression    "years ago"   Diabetes  mellitus    Dyspnea    in the past    GERD (gastroesophageal reflux disease)    Heart murmur    Hepatic cirrhosis ([place]) 06/29/2017   History of kidney stones    Hyperlipidemia    hx of transaminitis secondary to statin and he has decided not to use statins secondary to potential side effects.   Hypertension    Nephrolithiasis    Osteoarthritis, knee    Pericardial effusion a. subxiphoid pericardial window on 04/21/2016 04/28/2016   Peripheral neuropathic pain 05/12/2016   Sleep apnea     Central apnea. 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 MRI of the brain 2007  showed acute left lateral medullary infarct in the distribution of left posterior inferior cerebral artery , narrowing of the left vertebral with severe diminution of flow or acute occlusion. 2-D echo was normal no embolic source found.    Family History: Diabetes and stroke in mother, father with lung cancer.  Social History: Rents a room from a friend.  No alcohol or tobacco use.  Review of Systems: A complete ROS was negative except as per HPI.   Physical Exam:  Vitals with BMI 12/23/2020 12/23/2020 12/23/2020  Height - - -  Weight - - -  BMI - - -  Systolic 295 621 80  Diastolic 77 68 42  Pulse 70 78 -     Physical Exam Exam conducted with a chaperone present.  Constitutional:      General: He is not in acute distress.    Appearance: Normal appearance.  HENT:     Head: Normocephalic and atraumatic.     Mouth/Throat:     Mouth: Mucous membranes are dry.  Eyes:     Extraocular Movements: Extraocular movements intact.     Conjunctiva/sclera: Conjunctivae normal.     Pupils: Pupils are equal, round, and reactive to light.  Cardiovascular:     Rate and Rhythm: Normal rate and regular rhythm.     Heart sounds: Murmur heard.  Systolic murmur is present with a grade of 3/6.    No friction rub. No gallop.  Pulmonary:     Effort: Pulmonary effort is normal.     Breath sounds: Normal breath sounds. No wheezing, rhonchi or rales.  Abdominal:     General: Abdomen is flat. Bowel sounds are normal. There is no distension.     Palpations: Abdomen is soft.     Tenderness: There is no abdominal tenderness.  Genitourinary:    Comments: Chaperone present: There is a 1 cm x 1 cm lesion with erythematous base and overlying vesicles Musculoskeletal:        General: No swelling or tenderness. Normal range of motion.     Cervical back: Neck supple.     Right lower leg: No edema.     Left lower leg: No edema.  Skin:    General: Skin is warm and dry.  Neurological:     General: No  focal deficit present.     Mental Status: He is alert and oriented to person, place, and time. Mental status is at baseline.     Cranial Nerves: No cranial nerve deficit.  Psychiatric:        Mood and Affect: Mood normal.        Behavior: Behavior normal.     EKG:  Compared to prior in August 2022: personally reviewed my interpretation is sinus rhythm with 1st degree AV block, LAD, T-wave inversions in I and avL (unchanged from prior) and in V5 and V6 (new)  Assessment & Plan by Problem: Active Problems: Hypotension  #Hypotension Patient endorses a history of dizziness when standing and with exertion for the past 1-2 months. He also admits to feeling dehydrated. He was seen in the clinic earlier today with systolic BP's in the 45X. Also with dry mucous membranes on exam.  Glucose of 368 in the clinic.  Likely multifactorial in the setting of decreased PO intake, he is also on 3 different antihypertensive regimens at home.  Hyperglycemia could be contributing to diuresis as well.  Patient received 2 L NS boluses while in the clinic room. Will continue to monitor. - S/p 2L IV NS  - Orthostatics pending - UA pending - Trend BMP  #Penile lesion Physical exam in the presence of chaperone shows small vesicles on an erythematous base appears to represent hepatic lesion. - Continue valtrex  #New T-wave inversions in V5 and V6 Compared to EKG in August 2022, overall unchanged with the exception of new T-wave inversions in V5 and V6. -Troponins pending  #Paroxysmal Afib -Telemetry -Continue Eliquis 5 mg BID  #T2DM Patient reports that he has been taking NovoLog and lispro 18 units each 3 times daily.  Most recent A1c of 11.4 in August 2022.  CBG of 368 today. -Semglee 20 U daily -SSI, moderate -A1c pending  Dispo: Admit patient to Observation with expected length of stay less than 2 midnights.  Signed: Orvis Brill, MD 12/23/2020, 5:10 PM  Pager: [phone]  After 5pm on  weekdays and 1pm on weekends: On Call pager: [phone]

## 2020-12-23 NOTE — Progress Notes (Signed)
   CC: Dizziness.  HPI:  Mark Cabrera is a 70 y.o. male with PMH as below who presents to clinic for follow-up and found to be hypotensive. Please see problem based charting for evaluation, assessment and plan.  Past Medical History:  Diagnosis Date   Acute diastolic congestive heart failure (HCC)    AKI (acute kidney injury) ([place])    Aortic valve stenosis s/p AVR 2018   Echo 2/22: Poor acoustic windows, EF 50-55, mild LVH, normal RVSF, mild MR, trivial AI, no AS, no pericardial effusion   Atrial fibrillation (HCC) - post-op CABG    04/2016 CHA2DS2VAS score = 5   Atypical nevi    Coronary artery disease s/p 2 vessel CABG    Depression    "years ago"   Diabetes mellitus    Dyspnea    in the past    GERD (gastroesophageal reflux disease)    Heart murmur    Hepatic cirrhosis ([place]) 06/29/2017   History of kidney stones    Hyperlipidemia    hx of transaminitis secondary to statin and he has decided not to use statins secondary to potential side effects.   Hypertension    Nephrolithiasis    Osteoarthritis, knee    Pericardial effusion a. subxiphoid pericardial window on 04/21/2016 04/28/2016   Peripheral neuropathic pain 05/12/2016   Sleep apnea     Central apnea. 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 2-D echo was normal no embolic source found.    Review of Systems:  Constitutional: Negative for fever or weight changes. Positive for fatigue Eyes: Positive for blurry vision. Respiratory: Negative for shortness of breath or wheezing. Cardiac: Negative for chest pain or palpitations MSK: Negative for back pain GU: Positive for  mild dysuria and bump on penis Abdomen: Negative for abdominal pain, constipation or diarrhea Neuro: Negative for headache. Positive for dizziness and weakness  Physical Exam: General: Pleasant, under-nourished elderly male. No acute distress. HEENT: Dry mucous membrane. PERRLA.  Neck: Supple.  No JVD. Cardiac: Mildly tachycardic. Regular rhythm.  No murmurs, rubs or gallops. No LE edema Respiratory: Lungs CTAB. No wheezing or crackles.  Normal effort. Abdominal: Soft, symmetric and non tender. Normal BS. Skin: Warm, dry and intact without rashes or lesions.  Decreased skin turgor Extremities: Atraumatic. Full ROM. Palpable radial and DP pulses. Neuro: A&O x 3. Moves all extremities. Normal sensation.  Psych: Appropriate mood and affect.  Vitals:   12/23/20 1417 12/23/20 1509  BP: (!) 88/61 (!) 80/42  Pulse: 94   Temp: 98.2 F (36.8 C)   TempSrc: Oral   SpO2: 97%   Weight: 203 lb (92.1 kg)   Height: 5\' 8"  (1.727 m)      Assessment & Plan:   See Encounters Tab for problem based charting.  Patient discussed with Dr. Lockie Pares, MD, MPH

## 2020-12-23 NOTE — Plan of Care (Signed)

## 2020-12-24 DIAGNOSIS — I2510 Atherosclerotic heart disease of native coronary artery without angina pectoris: Secondary | ICD-10-CM | POA: Diagnosis not present

## 2020-12-24 DIAGNOSIS — I959 Hypotension, unspecified: Secondary | ICD-10-CM | POA: Diagnosis not present

## 2020-12-24 DIAGNOSIS — I480 Paroxysmal atrial fibrillation: Secondary | ICD-10-CM | POA: Diagnosis not present

## 2020-12-24 DIAGNOSIS — Z9181 History of falling: Secondary | ICD-10-CM | POA: Diagnosis not present

## 2020-12-24 DIAGNOSIS — I110 Hypertensive heart disease with heart failure: Secondary | ICD-10-CM | POA: Diagnosis not present

## 2020-12-24 DIAGNOSIS — E119 Type 2 diabetes mellitus without complications: Secondary | ICD-10-CM | POA: Diagnosis not present

## 2020-12-24 LAB — BASIC METABOLIC PANEL
Anion gap: 7 (ref 5–15)
BUN: 14 mg/dL (ref 8–23)
CO2: 24 mmol/L (ref 22–32)
Calcium: 8.8 mg/dL — ABNORMAL LOW (ref 8.9–10.3)
Chloride: 102 mmol/L (ref 98–111)
Creatinine, Ser: 0.95 mg/dL (ref 0.61–1.24)
GFR, Estimated: >60 mL/min (ref >60)
Glucose, Bld: 295 mg/dL — ABNORMAL HIGH (ref 70–99)
Potassium: 4.2 mmol/L (ref 3.5–5.1)
Sodium: 133 mmol/L — ABNORMAL LOW (ref 135–145)

## 2020-12-24 LAB — CBC
HCT: 30.3 % — ABNORMAL LOW (ref 39.0–52.0)
Hemoglobin: 10.9 g/dL — ABNORMAL LOW (ref 13.0–17.0)
MCH: 30.8 pg (ref 26.0–34.0)
MCHC: 36 g/dL (ref 30.0–36.0)
MCV: 85.6 fL (ref 80.0–100.0)
Platelets: 121 10*3/uL — ABNORMAL LOW (ref 150–400)
RBC: 3.54 MIL/uL — ABNORMAL LOW (ref 4.22–5.81)
RDW: 13.2 % (ref 11.5–15.5)
WBC: 2.3 10*3/uL — ABNORMAL LOW (ref 4.0–10.5)
nRBC: 0 % (ref 0.0–0.2)

## 2020-12-24 LAB — GLUCOSE, CAPILLARY
Glucose-Capillary: 293 mg/dL — ABNORMAL HIGH (ref 70–99)
Glucose-Capillary: 260 mg/dL — ABNORMAL HIGH (ref 70–99)

## 2020-12-24 MED ORDER — LISINOPRIL 10 MG PO TABS
10.0000 mg | ORAL_TABLET | Freq: Every day | ORAL | Status: DC
  Administered 2020-12-24: 10 mg via ORAL
  Filled 2020-12-24: qty 1

## 2020-12-24 MED ORDER — VALACYCLOVIR HCL 1 G PO TABS
1000.0000 mg | ORAL_TABLET | Freq: Two times a day (BID) | ORAL | 0 refills | Status: DC
Start: 2020-12-24 — End: 2022-01-25

## 2020-12-24 MED ORDER — INSULIN ASPART 100 UNIT/ML IJ SOLN
18.0000 [IU] | Freq: Three times a day (TID) | INTRAMUSCULAR | Status: DC
  Administered 2020-12-24: 18 [IU] via SUBCUTANEOUS

## 2020-12-24 MED ORDER — SOLIQUA 100-33 UNT-MCG/ML ~~LOC~~ SOPN: 40.0000 [IU] | PEN_INJECTOR | Freq: Every day | SUBCUTANEOUS | 0 refills | Status: DC

## 2020-12-24 NOTE — Progress Notes (Signed)
AVS provided and reviewed with pt. All questions answered at this time.

## 2020-12-24 NOTE — Plan of Care (Signed)

## 2020-12-24 NOTE — Progress Notes (Signed)
Inpatient Diabetes Program Recommendations  AACE/ADA: New Consensus Statement on Inpatient Glycemic Control (2015)  Target Ranges:  Prepandial:   less than 140 mg/dL      Peak postprandial:   less than 180 mg/dL (1-2 hours)      Critically ill patients:  140 - 180 mg/dL   Lab Results  Component Value Date   GLUCAP 293 (H) 12/24/2020   HGBA1C 11.8 (H) 12/23/2020    Review of Glycemic Control Results for Mark Cabrera, Mark Cabrera (MRN [id number]) as of 12/24/2020 09:00  Ref. Range 12/23/2020 14:46 12/23/2020 18:39 12/23/2020 20:39 12/24/2020 07:30  Glucose-Capillary Latest Ref Range: 70 - 99 mg/dL 368 (H) 204 (H) 280 (H) 293 (H)   Diabetes history: DM 2 Outpatient Diabetes medications: Soliqua 40 units, Novolog 18 units tid Current orders for Inpatient glycemic control:  Semglee 20 units Novolog 0-15 units tid  Novolog 18 units tid meal coverage  A1c 11.8% on 10/13  Diabetes Coordinator spoke with pt regarding A1c on 8/30. Pt forgets his medications at home.  Inpatient Diabetes Program Recommendations:    -  Increase Semglee to 30 units, closer to home dose   Thanks,  Tama Headings RN, MSN, BC-ADM Inpatient Diabetes Coordinator Team Pager [phone] (8a-5p)

## 2020-12-24 NOTE — Discharge Summary (Signed)
Name: Mark Cabrera MRN: [id number] DOB: 08-16-1950 70 y.o. PCP: [name], MD  Date of Admission: 12/23/2020  5:49 PM Date of Discharge: 12/24/2020 Attending Physician: Lucious Groves, DO  Discharge Diagnosis: 1. Symptomatic hypotension, resolved 2. Uncontrolled Type II Diabetes Mellitus 3. Herpes genitalia 4. Paroxysmal Afib 5. History of Hypertension   Discharge Medications: Allergies as of 12/24/2020       Reactions   Morpholine Salicylate Other (See Comments)   Hallucinations   Penicillins Other (See Comments)   Allergic- reaction?? Has patient had a PCN reaction causing immediate rash, facial/tongue/throat swelling, SOB or lightheadedness with hypotension: Unk Has patient had a PCN reaction causing severe rash involving mucus membranes or skin necrosis: Unk Has patient had a PCN reaction that required hospitalization: Unk Has patient had a PCN reaction occurring within the last 10 years: No If all of the above answers are "NO", then may proceed with Cephalosporin use. Other reaction(s): Unknown   Morphine And Related Other (See Comments)   Hallucinations   Sglt2 Inhibitors Rash, Other (See Comments)   Candidiasis infection prone   Statins Rash, Other (See Comments)   Severe rash and back pain, and muscle pain        Medication List     STOP taking these medications    fluconazole 150 MG tablet Commonly known as: DIFLUCAN   furosemide 40 MG tablet Commonly known as: Lasix   isosorbide mononitrate 30 MG 24 hr tablet Commonly known as: IMDUR       TAKE these medications    Accu-Chek Guide test strip Generic drug: glucose blood Use as instructed   Accu-Chek Softclix Lancets lancets Use as instructed   acetaminophen 325 MG tablet Commonly known as: TYLENOL Take 2 tablets (650 mg total) by mouth every 4 (four) hours as needed for headache or mild pain.   Eliquis 5 MG Tabs tablet Generic drug: apixaban Take 1 tablet by mouth twice  daily   escitalopram 10 MG tablet Commonly known as: LEXAPRO Take 1 tablet (10 mg total) by mouth daily.   Insulin Pen Needle 31G X 5 MM Misc Use one pen needle three times daily. Dx E11.49   lisinopril 10 MG tablet Commonly known as: ZESTRIL Take 1 tablet (10 mg total) by mouth daily.   NovoLOG FlexPen 100 UNIT/ML FlexPen Generic drug: insulin aspart INJECT 18 UNITS UNDER THE SKIN THREE TIMES DAILY WITH MEALS What changed:  how much to take how to take this when to take this additional instructions   potassium chloride SA 20 MEQ tablet Commonly known as: KLOR-CON One tablet twice a day for five days then one tablet daily after that   Repatha SureClick 893 MG/ML Soaj Generic drug: Evolocumab INJECT 1 PEN INTO THE SKIN EVERY 14 (FOURTEEN) DAYS. What changed:  how much to take how to take this when to take this   Soliqua 100-33 UNT-MCG/ML Sopn Generic drug: Insulin Glargine-Lixisenatide Inject 40 Units into the skin daily. What changed: Another medication with the same name was added. Make sure you understand how and when to take each.   Soliqua 100-33 UNT-MCG/ML Sopn Generic drug: Insulin Glargine-Lixisenatide Inject 40 Units into the skin daily. What changed: You were already taking a medication with the same name, and this prescription was added. Make sure you understand how and when to take each.   valACYclovir 1000 MG tablet Commonly known as: VALTREX Take 1 tablet (1,000 mg total) by mouth 2 (two) times daily.   vitamin B-12 100  MCG tablet Commonly known as: CYANOCOBALAMIN Take 100 mcg by mouth daily.        Disposition and follow-up:   [name] was discharged from [facility] in Good condition.  At the hospital follow up visit please address:  1.  Symptomatic hypotension, history of HTN: At discharge, patient's blood pressures were in the 295 systolic (see below).  We resumed his lisinopril regimen (10 mg p.o. daily).  We have  instructed the patient to hold his Lasix and Imdur regimens until his follow-up on Thursday, 10/20.  Herpes genitalia: Patient is on day 2 of 7-10 days of Valtrex 1000 mg BID.  Uncontrolled T2DM: Patient reports that he was taking NovoLog and lispro regimens at the same time.  Prior to discharge, he was instructed to take NovoLog 18 units 3 times daily with meals and Soliqua 40 units daily at bedtime.  2.  Labs / imaging needed at time of follow-up: None  3.  Pending labs/ test needing follow-up: None  Follow-up Appointments:  Follow-up Information     [place]. Go in 6 day(s).   Why: You have an ppointment on 12/30/2020 at 1:15pm Contact information: [address] [place] Hospital Course by problem list: 1. Symptomatic hypotension, history of HTN: Presented to the [facility] clinic on 10/13 with complaints of dizziness when standing and with exertion for the past 1 to 2 months. He states that he also feels pretty dehydrated. While in the clinic, systolic blood pressures x2 were both in the 80s. He also had dry mucous membranes on exam. Glucose of 368 in the clinic. Likely multifactorial in the setting of decreased PO intake, he is also on a few different antihypertensive regimens at home. Hyperglycemia could be contributing to diuresis as well.  Patient received 2 L NS boluses while in the clinic room and was admitted here and observed overnight. In the AM, blood pressures had improved, with his most recent being 149/83. Orthostatics prior to discharge were 145/89 sitting and 131/74 standing, Pulse 64 sitting, 70 standing.  We resumed his lisinopril regimen (10 mg po daily) but continued to hold his Lasix. He was discharged with the recommendation of staying well-hydrated throughout the day and was given instruction on medical management of his diabetes with [place] and NovoLog (see below).    2.  Herpes  genitalia: Physical exam in the presence of chaperone shows small vesicles on an erythematous base. On hospital day 0, patient was placed on Valtrex regimen, 1000 mg p.o. twice daily. He will continue this for a total of 7 to 10 days.   3.  Paroxysmal A. Fib: Patient was on telemetry during his hospitalization, and we continued his Eliquis 5 mg twice daily throughout.  4.  Uncontrolled type 2 diabetes: Patient reports that he has been taking NovoLog and lispro 18 units each 3 times daily. Most recent A1c of 11.8 . CBGs in the 300s in the clinic, decreased to mid 200s on discharge.  He was provided instruction on using NovoLog as mealtime and on using Soliqua as basal dose daily rather than using 2 mealtime regimens.  Discharge regimen: NovoLog 18 units 3 times daily with meals, Soliqua 40 units daily at bedtime.  5. New T-wave inversions in V5 and V6: Compared to EKG in August 2022, EKG was overall unchanged with the exception of new T-wave inversions  in V5 and V6.  Therefore, we ordered troponins x2, however these were unremarkable (25, 25).  He remained asymptomatic throughout his hospitalization.  Discharge Exam:   BP (!) 149/83 (BP Location: Left Arm)   Pulse 63   Temp 97.6 F (36.4 C) (Oral)   Resp 17   SpO2 100%  Discharge exam:  General: NAD, nl appearance HE: Normocephalic, atraumatic, EOMI, Conjunctivae normal ENT: No congestion, no rhinorrhea, no exudate or erythema  Cardiovascular: Normal rate, regular rhythm. No rubs or gallops. 3/6 holosystolic murmur at LUSB. Pulmonary: Effort normal, breath sounds normal. No wheezes, rales, or rhonchi Abdominal: soft, nontender, non-distended Musculoskeletal: no swelling, deformity, injury or tenderness in extremities Skin: Warm, dry, no bruising, erythema, or rash Neuro: AAOx3, no new focal deficits appreciated Psychiatric/Behavioral: normal mood, normal behavior     Pertinent Labs, Studies, and Procedures:  CBC    Component Value  Date/Time   WBC 2.3 (L) 12/24/2020 0406   RBC 3.54 (L) 12/24/2020 0406   HGB 10.9 (L) 12/24/2020 0406   HGB 13.3 10/20/2020 1142   HCT 30.3 (L) 12/24/2020 0406   HCT 39.8 10/20/2020 1142   PLT 121 (L) 12/24/2020 0406   PLT 203 10/20/2020 1142   MCV 85.6 12/24/2020 0406   MCV 90 10/20/2020 1142   MCH 30.8 12/24/2020 0406   MCHC 36.0 12/24/2020 0406   RDW 13.2 12/24/2020 0406   RDW 13.4 10/20/2020 1142   LYMPHSABS 0.3 (L) 02/24/2020 0159   MONOABS 0.1 02/24/2020 0159   EOSABS 0.0 02/24/2020 0159   BASOSABS 0.0 02/24/2020 0159    BMP Latest Ref Rng & Units 12/24/2020 12/23/2020 11/10/2020  Glucose 70 - 99 mg/dL 295(H) 379(H) 211(H)  BUN 8 - 23 mg/dL 14 18 18   Creatinine 0.61 - 1.24 mg/dL 0.95 1.14 0.70  BUN/Creat Ratio 10 - 24 - - -  Sodium 135 - 145 mmol/L 133(L) 127(L) 135  Potassium 3.5 - 5.1 mmol/L 4.2 3.8 3.1(L)  Chloride 98 - 111 mmol/L 102 95(L) 102  CO2 22 - 32 mmol/L 24 22 24   Calcium 8.9 - 10.3 mg/dL 8.8(L) 9.1 8.5(L)   CBG (last 3)  Recent Labs    12/23/20 2039 12/24/20 0730 12/24/20 1144  GLUCAP 280* 293* 260*   Troponin: 25, 25 A1c: 11.8  Urinalysis    Component Value Date/Time   COLORURINE YELLOW 12/23/2020 1700   APPEARANCEUR CLEAR 12/23/2020 1700   APPEARANCEUR Turbid (A) 04/14/2020 1600   LABSPEC 1.005 12/23/2020 1700   PHURINE 5.0 12/23/2020 1700   GLUCOSEU >=500 (A) 12/23/2020 1700   GLUCOSEU NEG mg/dL 05/18/2006 0000   HGBUR SMALL (A) 12/23/2020 1700   BILIRUBINUR NEGATIVE 12/23/2020 1700   BILIRUBINUR Negative 04/14/2020 1600   KETONESUR NEGATIVE 12/23/2020 1700   PROTEINUR NEGATIVE 12/23/2020 1700   UROBILINOGEN 0.2 01/10/2017 1557   UROBILINOGEN 1.0 07/24/2009 1756   NITRITE NEGATIVE 12/23/2020 1700   LEUKOCYTESUR SMALL (A) 12/23/2020 1700    Discharge Instructions: Discharge Instructions     Call MD for:  difficulty breathing, headache or visual disturbances   Complete by: As directed    Call MD for:  persistant dizziness or  light-headedness   Complete by: As directed       Signed: Orvis Brill, MD 12/24/2020, 1:18 PM   Pager: [phone]

## 2020-12-24 NOTE — Progress Notes (Signed)
Date: 12/24/2020  Patient name: Mark Cabrera  Medical record number: [id number]  Date of birth: 04-20-1950   I have seen and evaluated Mark Cabrera and discussed their care with the Residency Team.  In brief, patient is a 70 year old male with a past medical history of chronic diastolic heart failure, A. fib on anticoagulation, aortic valve stenosis status post AVR, CAD status post two-vessel CABG, type 2 diabetes, GERD, cirrhosis, hyperlipidemia, hypertension, central sleep apnea, CVA and mild cognitive impairment who presented to [facility] with lightheadedness and is found to be hypotensive.  Patient has been having intermittent lightheadedness over the last couple of months and noted to be positional (occurred when patient was going from a sitting to a standing position or when he was exerting himself).  Lightheadedness resolved when he laid down.  Over the last few days patient states that the lightheadedness is worsened.  In [facility] he was noted to be hypotensive with SBP's in the 80s.  No chest pain, no palpitations, no syncope, no focal weakness, no tingling or numbness, no nausea or vomiting, no shortness of breath, no diarrhea, no fevers or chills.  He does report not drinking enough water at home.  He is also noted to have an elevated blood sugar in the 300s in [facility].  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  Today, patient states that his lightheadedness is much improved and almost resolved.  Patient did not have orthostatic hypotension today.  PMHx, Fam Hx, and/or Soc Hx : As per resident admit note  Vitals:   12/23/20 2037 12/24/20 0622  BP: (!) 146/80 (!) 149/83  Pulse: 68 63  Resp: 18 17  Temp: 97.6 F (36.4 C)   SpO2: 99% 100%   General: Awake, alert, oriented x3, NAD CVS: Regular rate and rhythm, normal heart sounds Lungs: CTA bilaterally Abdomen: Soft, nontender, nondistended, normoactive bowel  sounds Extremities: No edema noted, nontender to palpation Psych: Normal mood and affect HEENT: Normocephalic, atraumatic Skin: Warm and dry  Assessment and Plan: I have seen and evaluated the patient as outlined above. I agree with the formulated Assessment and Plan as detailed in the residents' note, with the following changes:   1.  Hypotension: -Patient presented to [facility] with a history of intermittent lightheadedness over the last 1 to 2 months worsening over the last 3 to 4 days with orthostatic symptoms (occurred while going from a sitting to a standing position).  He was found to have SBP in the 80s in [facility] and was admitted for further evaluation.  I suspect that the hypotension is likely secondary to dehydration in the setting of Lasix use, decreased water intake as well as hyperglycemia contributing to his dehydration. -Patient received 2 L normal saline bolus in the clinic with resolution of his hypotension -We will hold off on Lasix and Imdur at this time and restart his lisinopril as his SBP's are now in the 140s -Repeat orthostatic vital signs done today were within normal limits -Patient was noted to have mild hyponatremia (sodium 127) on admission likely secondary to hypovolemic hyponatremia.  Sodium is improved to 133 today.  No further work-up at this time -Patient stable for DC home today  2.  Penile lesion: -Patient is diagnosed with HSV in [facility] and is started on Valtrex.  We will continue this for now.  3.  Type 2 diabetes: -Patient was noted to be hyperglycemic with blood  sugars in the 300s on admission.  He was started on Semglee 20 units as well as NovoLog 18 units Premeal -His blood sugars today are in the 200s -We will restart the patient's home Soliqua 40 units on DC with Premeal NovoLog 18 units 3 times daily. -We will have patient follow-up in [facility] for further titration of his meds  4.  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 -Would obtain repeat CBC at follow-up and if he remains pancytopenic would consider further work-up for this  Mark Contes, MD 10/14/202212:54 PM

## 2020-12-24 NOTE — Discharge Instructions (Addendum)
You were admitted to the hospital for low blood pressures and received 2 L of fluids.  Your blood pressure is improved, so we will now have you take your lisinopril daily as prescribed.  Please do not take your Lasix and Imdur medicines until your next follow-up appointment on Thursday 10/20 at 1:15 PM.  You also have a new medication called Valtrex.  Please take this as prescribed (today, you are on day 2 out of 10 days total) and discuss this further with your primary care provider.   Diabetes medicines: Please take your NovoLog, 18 units with meals as you have been doing. Instead of taking the lispro with meals, please take Soliqua, 40 units daily at bedtime.

## 2020-12-24 NOTE — Evaluation (Addendum)
Physical Therapy Evaluation Patient Details Name: Mark Cabrera MRN: [id number] DOB: [birth year] Today's Date: 12/24/2020  History of Present Illness  Pt is a 70 y/o male admitted 12/23/20 with complaints of dizziness and hypotension, penile lesion. PMH includes: diastolic heart failure, afib, aortic valve stenosis s/p AVR, CAD s/p CABGx2, T2DM, cirrhosis, HTN, sleep apnea, CVA, vertebral artery dissection, mild cognitive impairment.  Clinical Impression   Pt presents with generalized weakness, impaired sensation in hands/feet which is chronic from CVA vs DM, impaired higher level balance. Pt to benefit from acute PT to address deficits. Pt ambulated hallway distance with and without walking stick, pt frequently reaching for environment which pt states is baseline. Pt complaining of feeling wobbly and slightly weaker vs baseline, but no overt dizziness (see BP and HR below).   Pt feels his dizziness most often occurs with changing positions, PT educated pt on orthostatic hypotension and waiting for dizziness to pass at each positional change, pt expresses understanding. PT feels pt is close to baseline, no follow up needs anticipated. PT to progress mobility as tolerated, and will continue to follow acutely.    BP, HR: -sitting: 145/89, 64 -standing 0 min: 133/74, 70 -standing 3 min: 136/97, 75     Recommendations for follow up therapy are one component of a multi-disciplinary discharge planning process, led by the attending physician.  Recommendations may be updated based on patient status, additional functional criteria and insurance authorization.  Follow Up Recommendations No PT follow up;Supervision for mobility/OOB    Equipment Recommendations  None recommended by PT    Recommendations for Other Services       Precautions / Restrictions Precautions Precautions: Fall Restrictions Weight Bearing Restrictions: No      Mobility  Bed Mobility Overal bed mobility: Modified  Independent                  Transfers Overall transfer level: Needs assistance Equipment used: None Transfers: Sit to/from Stand Sit to Stand: Supervision         General transfer comment: supervision for safety, slow to rise. sit to stand x2, from EOB both times once without AD and once with walking stick.  Ambulation/Gait Ambulation/Gait assistance: Supervision Gait Distance (Feet): 260 Feet Assistive device: None (x60 ft with walking stick) Gait Pattern/deviations: Step-through pattern;Decreased stride length;Drifts right/left Gait velocity: decr   General Gait Details: supervision for safety, pt with weaving gait suspect due to pt constantly reaching for environment to self-steady (pt states this is baseline). Varying BOS width, suspect due to history of altered sensation from CVA. no overt LOB, mild unsteadiness throughout.  Stairs            Wheelchair Mobility    Modified Rankin (Stroke Patients Only)       Balance Overall balance assessment: Needs assistance;History of Falls Sitting-balance support: No upper extremity supported;Feet supported Sitting balance-Leahy Scale: Normal     Standing balance support: No upper extremity supported;During functional activity Standing balance-Leahy Scale: Fair Standing balance comment: staggering with head turns, must reach for environment to self-steady often                             Pertinent Vitals/Pain Pain Assessment: No/denies pain    Home Living Family/patient expects to be discharged to:: Private residence Living Arrangements: Non-relatives/Friends Available Help at Discharge: Friend(s);Available 24 hours/day Type of Home: House Home Access: Stairs to enter;Ramped entrance Entrance Stairs-Rails: Right Entrance Stairs-Number of Steps: 2-3  Home Layout: One level Home Equipment: Clinical cytogeneticist - 4 wheels;Cane - single point;Grab bars - tub/shower      Prior Function Level of  Independence: Independent with assistive device(s)         Comments: independent mobility at home, uses walking stick in community (1 fall in the last 6 months, fall out of bed); independent ADLs, limited iADLs (does not drive)     Hand Dominance   Dominant Hand: Right    Extremity/Trunk Assessment   Upper Extremity Assessment Upper Extremity Assessment: Defer to OT evaluation    Lower Extremity Assessment Lower Extremity Assessment: Generalized weakness (endorses L hand and possible foot numbness since CVA, states "my balance has been off since then")    Cervical / Trunk Assessment Cervical / Trunk Assessment: Normal  Communication   Communication: No difficulties  Cognition Arousal/Alertness: Awake/alert Behavior During Therapy: WFL for tasks assessed/performed Overall Cognitive Status: History of cognitive impairments - at baseline                                 General Comments: hx of mild cognitive impairment, but appears [facility] for basic tasks      General Comments General comments (skin integrity, edema, etc.): VSS    Exercises     Assessment/Plan    PT Assessment Patient needs continued PT services  PT Problem List Decreased strength;Decreased mobility;Decreased activity tolerance;Decreased balance;Impaired sensation;Cardiopulmonary status limiting activity;Decreased knowledge of precautions;Decreased safety awareness       PT Treatment Interventions Therapeutic activities;Gait training;Therapeutic exercise;Patient/family education;Balance training;Stair training;Functional mobility training;Neuromuscular re-education;DME instruction    PT Goals (Current goals can be found in the Care Plan section)  Acute Rehab PT Goals Patient Stated Goal: home today PT Goal Formulation: With patient Time For Goal Achievement: 01/07/21 Potential to Achieve Goals: Good    Frequency Min 3X/week   Barriers to discharge        Co-evaluation                AM-PAC PT "6 Clicks" Mobility  Outcome Measure Help needed turning from your back to your side while in a flat bed without using bedrails?: None Help needed moving from lying on your back to sitting on the side of a flat bed without using bedrails?: None Help needed moving to and from a bed to a chair (including a wheelchair)?: None Help needed standing up from a chair using your arms (e.g., wheelchair or bedside chair)?: None Help needed to walk in hospital room?: A Little Help needed climbing 3-5 steps with a railing? : A Little 6 Click Score: 22    End of Session   Activity Tolerance: Patient tolerated treatment well Patient left: in bed;with call bell/phone within reach Nurse Communication: Mobility status PT Visit Diagnosis: Unsteadiness on feet (R26.81);Muscle weakness (generalized) (M62.81)    Time: 8811-0315 PT Time Calculation (min) (ACUTE ONLY): 22 min   Charges:   PT Evaluation $PT Eval Low Complexity: 1 Low     Harris Penton S, PT DPT Acute Rehabilitation Services Pager [phone]  Office [phone]   [place] 12/24/2020, 10:09 AM

## 2020-12-24 NOTE — Evaluation (Signed)
Occupational Therapy Evaluation Patient Details Name: Mark Cabrera MRN: [id number] DOB: 06-Sep-1950 Today's Date: 12/24/2020   History of Present Illness Pt is a 70 y/o male admitted 12/23/20 with complaints of dizziness and hypotension, penile lesion. PMH includes: diastolic heart failure, afib, aortic valve stenosis s/p AVR, CAD s/p CABGx2, T2DM, cirrhosis, HTN, sleep apnea, CVA, vertebral artery dissection, mild cognitive impairment.   Clinical Impression   PTA patient independent with ADLs, using cane for mobility in community.  Admitted for above and presenting with problem list below, including mild instability with mobility and weakness.  He requires supervision for transfers and mobility, as well as ADLs; anticipate he is close to baseline.  VSS during session with pt reports "unsteady" when standing but denies dizziness.  Believe he will benefit from further OT acutely to optimize independence and safety but anticipate no further needs after dc home. Will follow.      Recommendations for follow up therapy are one component of a multi-disciplinary discharge planning process, led by the attending physician.  Recommendations may be updated based on patient status, additional functional criteria and insurance authorization.   Follow Up Recommendations  No OT follow up;Supervision - Intermittent    Equipment Recommendations  None recommended by OT    Recommendations for Other Services       Precautions / Restrictions Precautions Precautions: Fall Restrictions Weight Bearing Restrictions: No      Mobility Bed Mobility Overal bed mobility: Modified Independent                  Transfers Overall transfer level: Needs assistance Equipment used: None Transfers: Sit to/from Stand Sit to Stand: Supervision              Balance Overall balance assessment: Mild deficits observed, not formally tested                                         ADL  either performed or assessed with clinical judgement   ADL Overall ADL's : Needs assistance/impaired     Grooming: Supervision/safety;Standing           Upper Body Dressing : Set up;Sitting   Lower Body Dressing: Supervision/safety;Sit to/from stand   Toilet Transfer: Ambulation;Supervision/safety           Functional mobility during ADLs: Supervision/safety General ADL Comments: supervision for functional mobility, mild balance deficits but antiipcate near baseline     Vision Baseline Vision/History: 1 Wears glasses (reading) Ability to See in Adequate Light: 0 Adequate Patient Visual Report:  (L eye blurry) Vision Assessment?: No apparent visual deficits     Perception     Praxis      Pertinent Vitals/Pain Pain Assessment: No/denies pain     Hand Dominance Right   Extremity/Trunk Assessment Upper Extremity Assessment Upper Extremity Assessment: Generalized weakness (reports L hand numbness from prior CVA)   Lower Extremity Assessment Lower Extremity Assessment: Defer to PT evaluation       Communication Communication Communication: No difficulties   Cognition Arousal/Alertness: Awake/alert Behavior During Therapy: WFL for tasks assessed/performed Overall Cognitive Status: History of cognitive impairments - at baseline                                 General Comments: hx of mild cognitive impairment, but appears [facility] for basic tasks  General Comments  VSS    Exercises     Shoulder Instructions      Home Living Family/patient expects to be discharged to:: Private residence Living Arrangements: Non-relatives/Friends Available Help at Discharge: Friend(s);Available 24 hours/day Type of Home: House Home Access: Stairs to enter;Ramped entrance Entrance Stairs-Number of Steps: 2-3 Entrance Stairs-Rails: Right Home Layout: One level     Bathroom Shower/Tub: Occupational psychologist: Standard     Home Equipment: Photographer - 4 wheels;Cane - single point;Grab bars - tub/shower          Prior Functioning/Environment Level of Independence: Independent with assistive device(s)        Comments: independent mobility at home, uses walking stick in community (1 fall in the last 6 months); independent ADLs, limited iADLs (does not drive)        OT Problem List: Decreased activity tolerance;Impaired balance (sitting and/or standing);Decreased safety awareness      OT Treatment/Interventions: Self-care/ADL training;Balance training;Therapeutic activities;DME and/or AE instruction;Therapeutic exercise    OT Goals(Current goals can be found in the care plan section) Acute Rehab OT Goals Patient Stated Goal: home today OT Goal Formulation: With patient Time For Goal Achievement: 01/07/21 Potential to Achieve Goals: Good  OT Frequency: Min 2X/week   Barriers to D/C:            Co-evaluation              AM-PAC OT "6 Clicks" Daily Activity     Outcome Measure Help from another person eating meals?: None Help from another person taking care of personal grooming?: A Little Help from another person toileting, which includes using toliet, bedpan, or urinal?: A Little Help from another person bathing (including washing, rinsing, drying)?: A Little Help from another person to put on and taking off regular upper body clothing?: None Help from another person to put on and taking off regular lower body clothing?: A Little 6 Click Score: 20   End of Session Nurse Communication: Mobility status  Activity Tolerance: Patient tolerated treatment well Patient left: Other (comment) (with PT)  OT Visit Diagnosis: Other abnormalities of gait and mobility (R26.89);History of falling (Z91.81)                Time: 3335-4562 OT Time Calculation (min): 13 min Charges:  OT General Charges $OT Visit: 1 Visit OT Evaluation $OT Eval Low Complexity: 1 Low  Jolaine Artist, OT Acute Rehabilitation  Services Pager [phone] Office [phone]   Delight Stare 12/24/2020, 9:45 AM

## 2020-12-24 NOTE — Progress Notes (Signed)
Internal Medicine Clinic Attending  I saw and evaluated the patient.  I personally confirmed the key portions of the history and exam documented by Dr. Amponsah and I reviewed pertinent patient test results.  The assessment, diagnosis, and plan were formulated together and I agree with the documentation in the resident's note.  

## 2020-12-27 ENCOUNTER — Telehealth: Payer: Self-pay | Admitting: *Deleted

## 2020-12-27 NOTE — Telephone Encounter (Signed)
Okay, looks like I have some open slots tomorrow so he can move up his appointment if he is able to find transportation tomorrow. Thanks.

## 2020-12-27 NOTE — Telephone Encounter (Signed)
Patient called in stating the antibiotic he received at hospital d/c for bladder infection is not working. States the infection has travelled to his ears. C/o ear pain, swelling, and "closing up." Offered appt today. Patient states he needs to call for transportation. Relayed his HFU appt is on 10/20 at 1315. He was unaware of this. He will call for transportation and call back for sooner appt if possible.  Of note, patient was treated for genital herpes with valacyclovir. No antibiotics on d/c list and no mention of bladder infection.

## 2020-12-28 DIAGNOSIS — E1165 Type 2 diabetes mellitus with hyperglycemia: Secondary | ICD-10-CM | POA: Diagnosis not present

## 2020-12-28 DIAGNOSIS — J189 Pneumonia, unspecified organism: Secondary | ICD-10-CM | POA: Diagnosis not present

## 2020-12-28 DIAGNOSIS — I110 Hypertensive heart disease with heart failure: Secondary | ICD-10-CM | POA: Diagnosis not present

## 2020-12-28 DIAGNOSIS — I5033 Acute on chronic diastolic (congestive) heart failure: Secondary | ICD-10-CM | POA: Diagnosis not present

## 2020-12-28 DIAGNOSIS — N3001 Acute cystitis with hematuria: Secondary | ICD-10-CM | POA: Diagnosis not present

## 2020-12-28 DIAGNOSIS — B9689 Other specified bacterial agents as the cause of diseases classified elsewhere: Secondary | ICD-10-CM | POA: Diagnosis not present

## 2020-12-29 ENCOUNTER — Telehealth: Payer: Medicare Other

## 2020-12-30 ENCOUNTER — Ambulatory Visit (INDEPENDENT_AMBULATORY_CARE_PROVIDER_SITE_OTHER): Payer: Medicare Other | Admitting: Student

## 2020-12-30 ENCOUNTER — Telehealth: Payer: Self-pay

## 2020-12-30 VITALS — BP 117/77 | HR 65 | Temp 97.9°F | Ht 68.0 in | Wt 203.8 lb

## 2020-12-30 DIAGNOSIS — I9589 Other hypotension: Secondary | ICD-10-CM | POA: Diagnosis not present

## 2020-12-30 DIAGNOSIS — R300 Dysuria: Secondary | ICD-10-CM | POA: Diagnosis not present

## 2020-12-30 DIAGNOSIS — H60502 Unspecified acute noninfective otitis externa, left ear: Secondary | ICD-10-CM | POA: Diagnosis not present

## 2020-12-30 DIAGNOSIS — N3001 Acute cystitis with hematuria: Secondary | ICD-10-CM

## 2020-12-30 DIAGNOSIS — Z794 Long term (current) use of insulin: Secondary | ICD-10-CM | POA: Diagnosis not present

## 2020-12-30 DIAGNOSIS — E861 Hypovolemia: Secondary | ICD-10-CM

## 2020-12-30 DIAGNOSIS — I2510 Atherosclerotic heart disease of native coronary artery without angina pectoris: Secondary | ICD-10-CM

## 2020-12-30 DIAGNOSIS — E1140 Type 2 diabetes mellitus with diabetic neuropathy, unspecified: Secondary | ICD-10-CM

## 2020-12-30 DIAGNOSIS — I119 Hypertensive heart disease without heart failure: Secondary | ICD-10-CM | POA: Diagnosis not present

## 2020-12-30 LAB — POCT URINALYSIS DIPSTICK
Bilirubin, UA: NEGATIVE
Glucose, UA: POSITIVE — AB
Ketones, UA: NEGATIVE
Nitrite, UA: NEGATIVE
Protein, UA: POSITIVE — AB
Spec Grav, UA: 1.025 (ref 1.010–1.025)
Urobilinogen, UA: 0.2 E.U./dL
pH, UA: 6 (ref 5.0–8.0)

## 2020-12-30 MED ORDER — METFORMIN HCL 500 MG PO TABS: 500.0000 mg | ORAL_TABLET | Freq: Two times a day (BID) | ORAL | 3 refills | Status: DC

## 2020-12-30 MED ORDER — OFLOXACIN 0.3 % OT SOLN: 5.0000 [drp] | Freq: Every day | OTIC | 0 refills | Status: DC

## 2020-12-30 MED ORDER — SULFAMETHOXAZOLE-TRIMETHOPRIM 800-160 MG PO TABS: 1.0000 | ORAL_TABLET | Freq: Two times a day (BID) | ORAL | 0 refills | Status: DC

## 2020-12-30 MED ORDER — CIPROFLOXACIN-DEXAMETHASONE 0.3-0.1 % OT SUSP: 4.0000 [drp] | Freq: Two times a day (BID) | OTIC | 2 refills | Status: DC

## 2020-12-30 NOTE — Telephone Encounter (Signed)
Thanks. Spoke with her.

## 2020-12-30 NOTE — Telephone Encounter (Signed)
Received TC from patient's daughter, EC, [name].  She states patient had an appt with Dr. Coy Saunas today and she was looking over his AVS and has questions about his medications. States patient was told to stop taking his diflucan, Imdur and lasix at hospital discharge and she see's nothing on the AVS about needing to restart this.  Wants to discuss with MD. Acquanetta Belling to Dr. Coy Saunas. Call at [phone] Thank you, SChaplin, RN,BSN

## 2020-12-30 NOTE — Patient Instructions (Signed)
Thank you, [name] for allowing [place] to provide your care today. Today we discussed your blood pressure, diabetes, ear pain and urinary symptoms.  We will make some changes to your medications and start you on some new medication.  Please make sure to pick this up at your local pharmacy.  I have ordered the following labs for you:   Lab Orders         Urinalysis, Reflex Microscopic         POCT Urinalysis Dipstick (62035)      I will call if any are abnormal. All of your labs can be accessed through "My Chart".   I have ordered the following medication/changed the following medications:  Start Bactrim 800-160 mg twice daily for 7 days Start Ciprodex, 4 drops into left ear twice daily Continue taking your metformin 500 mg twice daily  My Chart Access: [email or web address]  Please follow-up in 4 weeks or as needed  Please make sure to arrive 15 minutes prior to your next appointment. If you arrive late, you may be asked to reschedule.    We look forward to seeing you next time. Please call our clinic at [phone] if you have any questions or concerns. The best time to call is Monday-Friday from 9am-4pm, but there is someone available 24/7. If after hours or the weekend, call the main hospital number and ask for the Internal Medicine Resident On-Call. If you need medication refills, please notify your pharmacy one week in advance and they will send [place] a request.   Thank you for letting us take part in your care. Wishing you the best!  Lacinda Axon, MD 12/30/2020, 2:42 PM IM Resident, PGY-2 Oswaldo Milian 41:10

## 2020-12-30 NOTE — Progress Notes (Signed)
   CC: Hospital follow-up  HPI:  [name] is a 70 y.o. male with PMH as below who presents to clinic for hospital follow-up and evaluation of ear pain and dysuria. Please see problem based charting for evaluation, assessment and plan.  Past Medical History:  Diagnosis Date   Acute diastolic congestive heart failure (HCC)    AKI (acute kidney injury) ([place])    Aortic valve stenosis s/p AVR 2018   Echo 2/22: Poor acoustic windows, EF 50-55, mild LVH, normal RVSF, mild MR, trivial AI, no AS, no pericardial effusion   Atrial fibrillation (HCC) - post-op CABG    04/2016 CHA2DS2VAS score = 5   Atypical nevi    Coronary artery disease s/p 2 vessel CABG    Depression    "years ago"   Diabetes mellitus    Dyspnea    in the past    GERD (gastroesophageal reflux disease)    Heart murmur    Hepatic cirrhosis ([place]) 06/29/2017   History of kidney stones    Hyperlipidemia    hx of transaminitis secondary to statin and he has decided not to use statins secondary to potential side effects.   Hypertension    Nephrolithiasis    Osteoarthritis, knee    Pericardial effusion a. subxiphoid pericardial window on 04/21/2016 04/28/2016   Peripheral neuropathic pain 05/12/2016   Sleep apnea     Central apnea. 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 2-D echo was normal no embolic source found.    Review of Systems:  Constitutional: Negative for fever or fatigue Ears: Positive for ear pain, discharge and fullness Eyes: Negative for visual changes Respiratory: Negative for shortness of breath Cardiac: Negative for chest pain GU: Positive for  dysuria. Negative for hematuria Abdomen: Negative for abdominal pain, constipation or diarrhea Neuro: Negative for headache, dizziness or weakness  Physical Exam: General: Pleasant, a little disheveled elderly male.  No acute distress. HEENT: Dry mucous membrane. Left ear with mild canal erythema and edema.  Clear discharge in the meatus and intact tympanic membrane.  Mild left pinna tenderness Cardiac: RRR. No murmurs, rubs or gallops. No LE edema Respiratory: Lungs CTAB. No wheezing or crackles. Abdominal: Soft, symmetric and non tender. Normal BS. Skin: Warm, dry and intact without rashes or lesions Extremities: Atraumatic. Full ROM. Palpable radial and DP pulses. Neuro: A&O x 3. Moves all extremities.  No focal deficits. Psych: Appropriate mood and affect.  Vitals:   12/30/20 1328  BP: 117/77  Pulse: 65  Temp: 97.9 F (36.6 C)  TempSrc: Oral  SpO2: 100%  Weight: 203 lb 12.8 oz (92.4 kg)  Height: 5\' 8"  (1.727 m)         Assessment & Plan:   See Encounters Tab for problem based charting.  Patient discussed with Dr. Lorenz Coaster, MD, MPH

## 2020-12-31 ENCOUNTER — Ambulatory Visit: Payer: Medicare Other

## 2020-12-31 ENCOUNTER — Encounter: Payer: Self-pay | Admitting: Student

## 2020-12-31 DIAGNOSIS — H6090 Unspecified otitis externa, unspecified ear: Secondary | ICD-10-CM | POA: Insufficient documentation

## 2020-12-31 DIAGNOSIS — E119 Type 2 diabetes mellitus without complications: Secondary | ICD-10-CM

## 2020-12-31 DIAGNOSIS — Z794 Long term (current) use of insulin: Secondary | ICD-10-CM

## 2020-12-31 LAB — URINALYSIS, ROUTINE W REFLEX MICROSCOPIC
Bilirubin, UA: NEGATIVE
Ketones, UA: NEGATIVE
Nitrite, UA: NEGATIVE
Specific Gravity, UA: >=1.03 — AB (ref 1.005–1.030)
Urobilinogen, Ur: 0.2 mg/dL (ref 0.2–1.0)
pH, UA: 5.5 (ref 5.0–7.5)

## 2020-12-31 LAB — MICROSCOPIC EXAMINATION
Bacteria, UA: NONE SEEN
Casts: NONE SEEN /lpf
Epithelial Cells (non renal): NONE SEEN /hpf (ref 0–10)
WBC, UA: >30 /hpf — AB (ref 0–5)

## 2020-12-31 NOTE — Assessment & Plan Note (Signed)
Patient's metformin was discontinue during hospitalization earlier this year due to diarrhea.  Patient's blood sugar remains uncontrolled.  Plan to restart metformin and monitor for side effects. Plan: -- Restart metformin 500 mg BID, can increase dose at next office visit if patient tolerates current dose. --Continue Soliqua 40 units daily --Continue NovoLog 18 units 3 times daily with meals

## 2020-12-31 NOTE — Assessment & Plan Note (Signed)
Patient's BP is stable at 117/77.  Reports that his dizziness is improved since discharge from the hospital.  He denies any changes in his vision, shortness of breath, chest pain or headaches.  Imdur and Lasix discontinue due to recent hypotension in the setting of poor p.o. intake. -- Continue lisinopril 10 mg daily --BMP at next office visit

## 2020-12-31 NOTE — Chronic Care Management (AMB) (Signed)
Care Management    RN Visit Note  12/31/2020 Name: Mark Cabrera MRN: [id number] DOB: 11-28-1950  Subjective: Mark Cabrera is a 70 y.o. year old male who is a primary care patient of Mark Dame, MD. The care management team was consulted for assistance with disease management and care coordination needs.    Engaged with patient by telephone for follow up visit in response to provider referral for case management and/or care coordination services.   Consent to Services:   Mark Cabrera was given information about Care Management services today including:  Care Management services includes personalized support from designated clinical staff supervised by his physician, including individualized plan of care and coordination with other care providers 24/7 contact phone numbers for assistance for urgent and routine care needs. The patient may stop case management services at any time by phone call to the office staff.  Patient agreed to services and consent obtained.   Assessment: Review of patient past medical history, allergies, medications, health status, including review of consultants reports, laboratory and other test data, was performed as part of comprehensive evaluation and provision of chronic care management services.   SDOH (Social Determinants of Health) assessments and interventions performed:    Care Plan  Allergies  Allergen Reactions   Morpholine Salicylate Other (See Comments)    Hallucinations   Penicillins Other (See Comments)    Allergic- reaction?? Has patient had a PCN reaction causing immediate rash, facial/tongue/throat swelling, SOB or lightheadedness with hypotension: Unk Has patient had a PCN reaction causing severe rash involving mucus membranes or skin necrosis: Unk Has patient had a PCN reaction that required hospitalization: Unk Has patient had a PCN reaction occurring within the last 10 years: No If all of the above answers are "NO", then may  proceed with Cephalosporin use.  Other reaction(s): Unknown   Morphine And Related Other (See Comments)    Hallucinations    Sglt2 Inhibitors Rash and Other (See Comments)    Candidiasis infection prone   Statins Rash and Other (See Comments)    Severe rash and back pain, and muscle pain    Outpatient Encounter Medications as of 12/31/2020  Medication Sig   Accu-Chek Softclix Lancets lancets Use as instructed   acetaminophen (TYLENOL) 325 MG tablet Take 2 tablets (650 mg total) by mouth every 4 (four) hours as needed for headache or mild pain.   apixaban (ELIQUIS) 5 MG TABS tablet Take 1 tablet by mouth twice daily   ciprofloxacin-dexamethasone (CIPRODEX) OTIC suspension Place 4 drops into the left ear 2 (two) times daily.   escitalopram (LEXAPRO) 10 MG tablet Take 1 tablet (10 mg total) by mouth daily.   glucose blood (ACCU-CHEK GUIDE) test strip Use as instructed   insulin aspart (NOVOLOG FLEXPEN) 100 UNIT/ML FlexPen INJECT 18 UNITS UNDER THE SKIN THREE TIMES DAILY WITH MEALS (Patient taking differently: Inject 18 Units into the skin 3 (three) times daily with meals.)   Insulin Glargine-Lixisenatide (SOLIQUA) 100-33 UNT-MCG/ML SOPN Inject 40 Units into the skin daily.   Insulin Pen Needle 31G X 5 MM MISC Use one pen needle three times daily. Dx E11.49   lisinopril (ZESTRIL) 10 MG tablet Take 1 tablet (10 mg total) by mouth daily.   metFORMIN (GLUCOPHAGE) 500 MG tablet Take 1 tablet (500 mg total) by mouth 2 (two) times daily with a meal.   potassium chloride SA (KLOR-CON) 20 MEQ tablet One tablet twice a day for five days then one tablet daily after that  REPATHA SURECLICK 161 MG/ML SOAJ INJECT 1 PEN INTO THE SKIN EVERY 14 (FOURTEEN) DAYS. (Patient taking differently: Inject 1 pen into the skin every 14 (fourteen) days.)   SOLIQUA 100-33 UNT-MCG/ML SOPN Inject 40 Units into the skin daily.   sulfamethoxazole-trimethoprim (BACTRIM DS) 800-160 MG tablet Take 1 tablet by mouth 2 (two)  times daily.   valACYclovir (VALTREX) 1000 MG tablet Take 1 tablet (1,000 mg total) by mouth 2 (two) times daily.   vitamin B-12 (CYANOCOBALAMIN) 100 MCG tablet Take 100 mcg by mouth daily.   No facility-administered encounter medications on file as of 12/31/2020.    Patient Active Problem List   Diagnosis Date Noted   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   Balanitis 11/19/2020   Acute cystitis with hematuria    Thrombocytopenia (HCC)    Hypokalemia    Weakness 11/08/2020   Hyponatremia 11/08/2020   Left shoulder pain 10/20/2020   Dyspnea 09/23/2020   Mild cognitive impairment 08/25/2020   Hip pain, chronic, left 07/05/2020   Epididymitis 04/14/2020   Fatigue 03/18/2020   Hypotension 03/10/2020   Type 2 diabetes mellitus with hyperglycemia, with long-term current use of insulin ([place]) 02/24/2020   Abdominal wall bulge 12/03/2019   Dermoid inclusion cyst 08/07/2019   Chronic left shoulder pain 05/13/2019   Depression 11/05/2018   Blurry vision, bilateral 09/20/2018   CVA (cerebral vascular accident) ([place]) 06/28/2018   Hepatic cirrhosis ([place]) 06/29/2017   Healthcare maintenance 12/16/2016   Lipoma of left upper extremity 09/12/2016   Chronic knee pain 07/04/2016   Decreased visual acuity 07/04/2016   Pleural effusion on left, s/p throacentesis 04/18/16 04/28/2016   Paroxysmal atrial fibrillation ([place]) 04/16/2016   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/31/2016   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   CAD (coronary artery disease), native coronary artery    Chronic diastolic heart failure ([place])    Sleep apnea 08/02/2009   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   Multiple attempts to discuss patients health and he shares that he is doing well, except for the ear infection.  Patient does not see to want to be proactive with his health as evidenced by not taking his CBG readings for multiple days or checking his blood pressure.  Plan to collaborate with PCP. Knowledge Deficits related to basic understanding of hypertension pathophysiology and self care management Unable to independently Manage complex medical issues Nurse Case Manager Clinical Goal(s):  patient will verbalize understanding of plan for hypertension management patient will not experience hospital admission. Hospital Admissions in last 6 months; 1 admission from  12/23/20-12/24/20 patient will attend all scheduled medical appointments:  patient will demonstrate improved adherence to prescribed treatment plan for hypertension as evidenced by taking all medications as prescribed, monitoring and recording blood pressure as directed, adhering to low sodium/DASH diet Interventions:  Collaboration with Mark Dame, MD regarding development and update of comprehensive plan of care as evidenced by provider attestation and co-signature  Inter-disciplinary care team collaboration (see longitudinal plan of care) Evaluation of current treatment plan related to hypertension self management and patient's adherence to plan as established by provider. Provided education to patient re: stroke prevention, s/s of heart attack and stroke, DASH diet, complications of uncontrolled blood pressure Reviewed medications with patient and discussed importance of compliance Discussed plans with patient for ongoing care management follow up and provided patient with direct contact information for  care management team Advised patient, providing education and rationale, to monitor blood pressure daily and record, calling PCP for findings outside established parameters.  Reviewed scheduled/upcoming provider appointments including:  Self-Care Activities:  Self administers medications as prescribed Attends all scheduled provider appointments Patient Goals  - Self administer medications as prescribed  - Attend all scheduled provider appointments  - Call provider office for new concerns, questions, or BP outside discussed parameters  - Check BP and record as discussed  - Follows a low sodium diet/DASH diet - check blood pressure 3 times per week - agree to work together to make changes - ask questions to understand     Care Plan : Diabetes Type 2 (Adult)  Updates made by Mark Killian, RN since 12/31/2020 12:00 AM     Problem: Disease Progression (Diabetes, Type 2)      Long-Range Goal: Disease Progression Prevented or Minimized   Start Date: 12/31/2020  Recent Progress: Not on track  Note:   Diabetes: Discussed patients CBG readings and how he has been doing with them since his recent hospitalization.  Patient reports that he has not checked his CBG reading in the past several days.  Educated patient that he should be checking his CBG readings multiple times per day since he is insulin dependent.  Last documented CBG reading was 368 when he was admitted to acute.  Patient reluctant to discuss further his CBG's. Lab Results  Component Value Date   HGBA1C 11.8 (H) 12/23/2020  A1c goal: <7% Counseling provided: A1c and blood sugar goals; Complications of diabetes including kidney damage, retinal damage, and cardiovascular disease; Prevention and management of hypoglycemic episodes; Benefits of routine self-monitoring of blood sugar; Counseled to check feet daily and get yearly eye exams Reviewed medications with patient and discussed importance of medication  adherence Discussed plans with patient for ongoing care management follow up and provided patient with direct contact information for care management team      Plan: Telephone follow up appointment with care management team member scheduled for:  30 days  Mark Killian, RN, BSN, CCM Care Management Coordinator [facility] Internal Medicine Phone: [phone] / Fax: [phone]

## 2020-12-31 NOTE — Assessment & Plan Note (Signed)
Patient here for hospital follow-up after recent admission for symptomatic hypotension. Patient reports that his dizziness has improved. Patient's Imdur and Lasix were discontinued at hospital discharge. BP normal at 117/77 so will not continue Imdur or Lasix for now.  Patient continues to be dry on exam. -- Patient advised to stay hydrated. -- BP check at next office visit.

## 2020-12-31 NOTE — Patient Instructions (Signed)
Visit Information   Goals Addressed               This Visit's Progress     I need to manage my diabetes (pt-stated)        Lab Results  Component Value Date   HGBA1C 11.8 (H) 12/23/2020  Note: 12/31/20- Patient is not taking CBG readings, he shared he has not taken in several days.      Track and Manage My Blood Pressure-Hypertension        BP Readings from Last 3 Encounters:  12/30/20 117/77  12/24/20 (!) 156/76  12/23/20 105/68        Timeframe:  [place] Goal Priority:  High Start Date:   01/09/21                          Expected End Date:                       Follow Up Date: 02/09/21   - check blood pressure 3 times per week    Why is this important?   You won't feel high blood pressure, but it can still hurt your blood vessels.  High blood pressure can cause heart or kidney problems. It can also cause a stroke.  Making lifestyle changes like losing a little weight or eating less salt will help.  Checking your blood pressure at home and at different times of the day can help to control blood pressure.  If the doctor prescribes medicine remember to take it the way the doctor ordered.  Call the office if you cannot afford the medicine or if there are questions about it.     Notes: 12/31/20- Patient is not taking BP as recommended        The patient verbalized understanding of instructions, educational materials, and care plan provided today and declined offer to receive copy of patient instructions, educational materials, and care plan.   Telephone follow up appointment with care management team member scheduled for:  02/07/21@2 :00  [name], RN, BSN, CCM Care Management Coordinator [facility] Internal Medicine Phone: [phone] / Fax: [phone]

## 2020-12-31 NOTE — Assessment & Plan Note (Signed)
Who presents for evaluation of ear pain. Patient reports that 3 to 4 days ago he started having itching in both ears.  He squirt some saline water into both ears to help with itching. His right ear drained appropriately but his left ear did not drain completely. Since then he has had left ear fullness, pain with some occasional clear discharge.  He denies any headaches, or jaw pain. On exam, patient's left ear shows evidence of mild, erythema and edema as well as clear discharge in the meatus. Tympanic membrane intact and transparent. Mild tenderness to palpation of the left pinna. Patient's symptoms and exam findings consistent with acute otitis externa due to recent introduction of fluid/moisture into ears.  Plan: -- Start Ciprodex otic suspension, place 4 drops into left ear twice daily -- Clinic follow-up if symptoms do not resolve within a week

## 2020-12-31 NOTE — Assessment & Plan Note (Signed)
Patient with a history of UTI earlier this year presents with a complaint of dysuria.  Patient reports that since he left the hospital he has had some burning with urination.  He endorsed some chills but denies any fever, back pain, hematuria, nausea/vomiting or abdominal pain.  UA significant for glucosuria, RBCs, WBCs and leukocytes.  Previous urine culture 2 months ago showed Enterobacter aerogenes sensitive to Bactrim.  Plan: -- Bactrim DS 1 tablet twice daily for 7 days. -- Follow-up in 4 weeks or as needed

## 2021-01-04 NOTE — Progress Notes (Signed)
Internal Medicine Clinic Attending  Case discussed with Dr. Amponsah  At the time of the visit.  We reviewed the resident's history and exam and pertinent patient test results.  I agree with the assessment, diagnosis, and plan of care documented in the resident's note.  

## 2021-01-12 DIAGNOSIS — Z20828 Contact with and (suspected) exposure to other viral communicable diseases: Secondary | ICD-10-CM | POA: Diagnosis not present

## 2021-01-13 DIAGNOSIS — B9689 Other specified bacterial agents as the cause of diseases classified elsewhere: Secondary | ICD-10-CM | POA: Diagnosis not present

## 2021-01-13 DIAGNOSIS — J189 Pneumonia, unspecified organism: Secondary | ICD-10-CM | POA: Diagnosis not present

## 2021-01-13 DIAGNOSIS — I5033 Acute on chronic diastolic (congestive) heart failure: Secondary | ICD-10-CM | POA: Diagnosis not present

## 2021-01-13 DIAGNOSIS — I110 Hypertensive heart disease with heart failure: Secondary | ICD-10-CM | POA: Diagnosis not present

## 2021-01-13 DIAGNOSIS — N3001 Acute cystitis with hematuria: Secondary | ICD-10-CM | POA: Diagnosis not present

## 2021-01-13 DIAGNOSIS — E1165 Type 2 diabetes mellitus with hyperglycemia: Secondary | ICD-10-CM | POA: Diagnosis not present

## 2021-01-14 ENCOUNTER — Other Ambulatory Visit: Payer: Self-pay | Admitting: Internal Medicine

## 2021-01-20 ENCOUNTER — Ambulatory Visit (INDEPENDENT_AMBULATORY_CARE_PROVIDER_SITE_OTHER): Payer: Medicare Other | Admitting: Student

## 2021-01-20 ENCOUNTER — Telehealth: Payer: Self-pay

## 2021-01-20 DIAGNOSIS — N390 Urinary tract infection, site not specified: Secondary | ICD-10-CM

## 2021-01-20 MED ORDER — CIPROFLOXACIN HCL 500 MG PO TABS: 500.0000 mg | ORAL_TABLET | Freq: Two times a day (BID) | ORAL | 0 refills | Status: AC

## 2021-01-20 NOTE — Telephone Encounter (Signed)
RTC, patient states he has another UTI.  He is c/o dysuria x1 week and fever.  Appt offered for today an 1545, pt states he doesn't drive and has to give transportation 3 day notice. Discussed w/ Dr. Collene Gobble, Telehealth appt made for today at 1545.  Pt informed to have his phone on him this afternoon. SChaplin, RN,BSN

## 2021-01-20 NOTE — Assessment & Plan Note (Addendum)
Patient is presenting via telephone to discuss recent dysuria and fevers. Patient was previously seen in [facility] in late October due to dysuria. At that time UA was completed and patient was prescribed 7d of Bactrim based on previous urine cultures.  Mr. Stan reports that he completed all of his antibiotics. A few days after completion, patient reports that dysuria returned. Since then, he reports fevers and sweats. Mentions a time where he had to change sheets because he was so sweaty and hot. He denies any dizziness, nausea, vomiting, abdominal pain, or decreased appetite. He has been eating and drinking appropriately.   Patient's symptoms concerning for another urinary tract infection. This is the third UTI Mr. Munger has had this year. We will prescribe ciprofloxacin based on previous cultures from August. I have given instructions to Mr. Schellenberg to return to clinic if these symptoms recur. If this occurs, will need to re-culture urine and consider alternative diagnosis such as prostatitis.   - Ciprofloxacin 500mg  twice daily x7d - Return to clinic if symptoms do not resolve

## 2021-01-20 NOTE — Telephone Encounter (Signed)
Requesting to speak with a nurse about getting refill on antibiotic for bladder infection. Please call pt back.

## 2021-01-20 NOTE — Progress Notes (Signed)
  [facility] Internal Medicine Residency Telephone Encounter Continuity Care Appointment  HPI:  This telephone encounter was created for [name] on 01/20/2021 for the following purpose/cc dysuria.   Past Medical History:  Past Medical History:  Diagnosis Date   Acute diastolic congestive heart failure (HCC)    AKI (acute kidney injury) ([place])    Aortic valve stenosis s/p AVR 2018   Echo 2/22: Poor acoustic windows, EF 50-55, mild LVH, normal RVSF, mild MR, trivial AI, no AS, no pericardial effusion   Atrial fibrillation (HCC) - post-op CABG    04/2016 CHA2DS2VAS score = 5   Atypical nevi    Coronary artery disease s/p 2 vessel CABG    Depression    "years ago"   Diabetes mellitus    Dyspnea    in the past    GERD (gastroesophageal reflux disease)    Heart murmur    Hepatic cirrhosis ([place]) 06/29/2017   History of kidney stones    Hyperlipidemia    hx of transaminitis secondary to statin and he has decided not to use statins secondary to potential side effects.   Hypertension    Nephrolithiasis    Osteoarthritis, knee    Pericardial effusion a. subxiphoid pericardial window on 04/21/2016 04/28/2016   Peripheral neuropathic pain 05/12/2016   Sleep apnea     Central apnea. 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 2-D echo was normal no embolic source found.     ROS:  As per HPI   Assessment / Plan / Recommendations:  Please see A&P under problem oriented charting for assessment of the patient's acute and chronic medical conditions.  As always, pt is advised that if symptoms worsen or new symptoms arise, they should go to  an urgent care facility or to to ER for further evaluation.   Consent and Medical Decision Making:  Patient discussed with Dr. Angelia Cabrera This is a telephone encounter between [place] on 01/20/2021 for dysuria. The visit was conducted with the patient located at home and Mark Cabrera at [facility]. The patient's identity was confirmed using their DOB and current address. The patient has consented to being evaluated through a telephone encounter and understands the associated risks (an examination cannot be done and the patient may need to come in for an appointment) / benefits (allows the patient to remain at home, decreasing exposure to coronavirus). I personally spent 8 minutes on medical discussion.

## 2021-01-22 DIAGNOSIS — Z20828 Contact with and (suspected) exposure to other viral communicable diseases: Secondary | ICD-10-CM | POA: Diagnosis not present

## 2021-01-26 NOTE — Progress Notes (Signed)
Internal Medicine Clinic Attending ? ?Case discussed with Dr. Braswell  At the time of the visit.  We reviewed the resident?s history and exam and pertinent patient test results.  I agree with the assessment, diagnosis, and plan of care documented in the resident?s note.  ?

## 2021-01-27 ENCOUNTER — Encounter: Payer: Medicare Other | Admitting: Student

## 2021-02-07 ENCOUNTER — Encounter: Payer: Medicare Other | Admitting: Student

## 2021-02-07 ENCOUNTER — Telehealth: Payer: Medicare Other

## 2021-02-08 ENCOUNTER — Ambulatory Visit: Payer: Medicare Other

## 2021-02-08 DIAGNOSIS — E119 Type 2 diabetes mellitus without complications: Secondary | ICD-10-CM

## 2021-02-08 DIAGNOSIS — I119 Hypertensive heart disease without heart failure: Secondary | ICD-10-CM

## 2021-02-08 NOTE — Patient Instructions (Signed)
Visit Information  Thank you for taking time to visit with me today. Please don't hesitate to contact me if I can be of assistance to you before our next scheduled telephone appointment.  Our next appointment is by telephone on January 3 at [place].  Please call the care guide team at [phone] if you need to cancel or reschedule your appointment.   If you are experiencing a Mental Health or [place] or need someone to talk to, please call the [place] National Suicide Prevention Lifeline: (901)401-4255 or TTY: (819) 436-3835 TTY 587-796-6289) to talk to a trained counselor   The patient verbalized understanding of instructions, educational materials, and care plan provided today and declined offer to receive copy of patient instructions, educational materials, and care plan.   Telephone follow up appointment with care management team member scheduled for:03/15/21  [name], RN, BSN, CCM Care Management Coordinator [facility] Internal Medicine Phone: [phone]: [phone]

## 2021-02-08 NOTE — Chronic Care Management (AMB) (Signed)
Care Management    RN Visit Note  02/08/2021 Name: Mark Cabrera MRN: [id number] DOB: February 21, 1951  Subjective: Mark Cabrera is a 70 y.o. year old male who is a primary care patient of Mark Dame, MD. The care management team was consulted for assistance with disease management and care coordination needs.    Engaged with patient by telephone for follow up visit in response to provider referral for case management and/or care coordination services.   Consent to Services:   Mr. Borchers was given information about Care Management services today including:  Care Management services includes personalized support from designated clinical staff supervised by his physician, including individualized plan of care and coordination with other care providers 24/7 contact phone numbers for assistance for urgent and routine care needs. The patient may stop case management services at any time by phone call to the office staff.  Patient agreed to services and consent obtained.   Assessment: Review of patient past medical history, allergies, medications, health status, including review of consultants reports, laboratory and other test data, was performed as part of comprehensive evaluation and provision of chronic care management services.   SDOH (Social Determinants of Health) assessments and interventions performed:    Care Plan  Allergies  Allergen Reactions   Morpholine Salicylate Other (See Comments)    Hallucinations   Penicillins Other (See Comments)    Allergic- reaction?? Has patient had a PCN reaction causing immediate rash, facial/tongue/throat swelling, SOB or lightheadedness with hypotension: Unk Has patient had a PCN reaction causing severe rash involving mucus membranes or skin necrosis: Unk Has patient had a PCN reaction that required hospitalization: Unk Has patient had a PCN reaction occurring within the last 10 years: No If all of the above answers are "NO", then may  proceed with Cephalosporin use.  Other reaction(s): Unknown   Morphine And Related Other (See Comments)    Hallucinations    Sglt2 Inhibitors Rash and Other (See Comments)    Candidiasis infection prone   Statins Rash and Other (See Comments)    Severe rash and back pain, and muscle pain    Outpatient Encounter Medications as of 02/08/2021  Medication Sig   Accu-Chek Softclix Lancets lancets Use as instructed   acetaminophen (TYLENOL) 325 MG tablet Take 2 tablets (650 mg total) by mouth every 4 (four) hours as needed for headache or mild pain.   apixaban (ELIQUIS) 5 MG TABS tablet Take 1 tablet by mouth twice daily   ciprofloxacin-dexamethasone (CIPRODEX) OTIC suspension Place 4 drops into the left ear 2 (two) times daily.   escitalopram (LEXAPRO) 10 MG tablet Take 1 tablet (10 mg total) by mouth daily.   glucose blood (ACCU-CHEK GUIDE) test strip Use as instructed   insulin aspart (NOVOLOG FLEXPEN) 100 UNIT/ML FlexPen INJECT 18 UNITS UNDER THE SKIN THREE TIMES DAILY WITH MEALS (Patient taking differently: Inject 18 Units into the skin 3 (three) times daily with meals.)   Insulin Pen Needle 31G X 5 MM MISC Use one pen needle three times daily. Dx E11.49   lisinopril (ZESTRIL) 10 MG tablet Take 1 tablet (10 mg total) by mouth daily.   metFORMIN (GLUCOPHAGE) 500 MG tablet Take 1 tablet (500 mg total) by mouth 2 (two) times daily with a meal.   REPATHA SURECLICK 672 MG/ML SOAJ INJECT 1 PEN INTO THE SKIN EVERY 14 (FOURTEEN) DAYS. (Patient taking differently: Inject 1 pen into the skin every 14 (fourteen) days.)   SOLIQUA 100-33 UNT-MCG/ML SOPN Inject 40 Units  into the skin daily.   SOLIQUA 100-33 UNT-MCG/ML SOPN INJECT 40 UNITS SUBCUTANEOUSLY ONCE DAILY   sulfamethoxazole-trimethoprim (BACTRIM DS) 800-160 MG tablet Take 1 tablet by mouth 2 (two) times daily.   valACYclovir (VALTREX) 1000 MG tablet Take 1 tablet (1,000 mg total) by mouth 2 (two) times daily.   vitamin B-12 (CYANOCOBALAMIN)  100 MCG tablet Take 100 mcg by mouth daily.   No facility-administered encounter medications on file as of 02/08/2021.    Patient Active Problem List   Diagnosis Date Noted   Otitis externa 12/31/2020   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   Balanitis 50/35/4656   Complicated urinary tract infection    Thrombocytopenia (HCC)    Hyponatremia 11/08/2020   Left shoulder pain 10/20/2020   Dyspnea 09/23/2020   Mild cognitive impairment 08/25/2020   Hip pain, chronic, left 07/05/2020   Epididymitis 04/14/2020   Fatigue 03/18/2020   Hypotension 03/10/2020   Type 2 diabetes mellitus with hyperglycemia, with long-term current use of insulin ([place]) 02/24/2020   Abdominal wall bulge 12/03/2019   Dermoid inclusion cyst 08/07/2019   Chronic left shoulder pain 05/13/2019   Depression 11/05/2018   Blurry vision, bilateral 09/20/2018   CVA (cerebral vascular accident) ([place]) 06/28/2018   Hepatic cirrhosis ([place]) 06/29/2017   Healthcare maintenance 12/16/2016   Lipoma of left upper extremity 09/12/2016   Chronic knee pain 07/04/2016   Decreased visual acuity 07/04/2016   Pleural effusion on left, s/p throacentesis 04/18/16 04/28/2016   Paroxysmal atrial fibrillation ([place]) 04/16/2016   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/31/2016   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   CAD (coronary artery disease), native coronary artery    Chronic diastolic heart failure ([place])    Sleep apnea 08/02/2009   Diabetes mellitus with neurological manifestation ([place]) 10/18/2007   Dyslipidemia 05/18/2006   Hypertensive heart disease     Conditions to be addressed/monitored: HTN and DMII  Care Plan : Coronary Artery Disease (Adult)  Updates made by [name], RN since 02/08/2021 12:00 AM     Problem: Disease Progression (Coronary Artery Disease)      Long-Range Goal: Disease Progression Prevented or Minimized   Start Date: 12/08/2020  Note:   Current Barriers: Successful outreach to patient this afternoon.   He continues to be non-compliant with taking his CBG readings, shared with this RNCM he has not taken a reading in many days.  Educated patient on the need to monitor his CBG.  Patients last A1C was 11.  Patient notes that he needs an appointment to come to the office as he still has dysuria.  Message sent to [facility] front desk staff to call patient and schedule appointment. Knowledge Deficits related to plan of care for management of HTN and DMII  Chronic Disease Management support and education needs related to HTN and DMII  Financial Constraints  Transportation barriers  RNCM Clinical Goal(s):  Patient will verbalize understanding of plan for management of HTN and DMII as evidenced by open discussions with RNCM and PCP. take all medications exactly as prescribed and will call provider for medication related questions as evidenced by compliance with medications. demonstrate Ongoing adherence to prescribed treatment plan for HTN and DMII as evidenced by ability to discuss your CBG readings. continue to work with RN Care Manager to address care management and care coordination needs related to  HTN and DMII as evidenced by adherence to CM Team Scheduled appointments through collaboration with RN Care manager, provider, and care team.   Interventions: 1:1 collaboration  with primary care provider regarding development and update of comprehensive plan of care as evidenced by provider attestation and co-signature Inter-disciplinary care team collaboration (see longitudinal plan of care) Evaluation of current treatment plan related to  self management and patient's adherence to plan as established by provider   Health Maintenance Interventions:  (Status:  Goal on track:  NO.) Long Term Goal Patient interviewed about adult health maintenance status including  Regular eye checkups Regular Dental Care    Diabetes Eye Exam    Blood Pressure    Hemoglobin A1c    Diabetes Foot Exam    Advised patient to  discuss  Regular eye checkups Regular Dental Care    Diabetes Eye Exam    Blood Pressure    Hemoglobin A1c    Diabetes Foot Exam    with primary care provider    Diabetes Interventions:  (Status:  Goal on track:  NO.) Long Term Goal Assessed patient's understanding of A1c goal: <7% Provided education to patient about basic DM disease process Reviewed medications with patient and discussed importance of medication adherence Counseled on importance of regular laboratory monitoring as prescribed Discussed plans with patient for ongoing care management follow up and provided patient with direct contact information for care management team Lab Results  Component Value Date   HGBA1C 11.8 (H) 12/23/2020   Hypertension Interventions:  (Status:  Goal on track:  NO.) Long Term Goal Last practice recorded BP readings:  BP Readings from Last 3 Encounters:  12/30/20 117/77  12/24/20 (!) 156/76  12/23/20 105/68  Most recent eGFR/CrCl:  Lab Results  Component Value Date   EGFR 75 10/20/2020    No components found for: CRCL  Evaluation of current treatment plan related to hypertension self management and patient's adherence to plan as established by provider Reviewed medications with patient and discussed importance of compliance Discussed plans with patient for ongoing care management follow up and provided patient with direct contact information for care management team Reviewed scheduled/upcoming provider appointments including:   Patient Goals/Self-Care Activities: Take all medications as prescribed Attend all scheduled provider appointments Call pharmacy for medication refills 3-7 days in advance of running out of medications Perform all self care activities independently  Call provider office for new concerns or questions   Follow Up Plan:  Telephone follow up appointment with care management team member scheduled for:  30 days       Care Plan : Diabetes Type 2 (Adult)  Updates  made by [name], RN since 02/08/2021 12:00 AM     Problem: Disease Progression (Diabetes, Type 2)      Long-Range Goal: Disease Progression Prevented or Minimized   Start Date: 12/31/2020  Recent Progress: Not on track  Note:   Diabetes: Patient noted that he has not checked his CBG readings in several days.  He is non-compliant with monitoring his insulin dependent diabetes. Lab Results  Component Value Date   HGBA1C 11.8 (H) 12/23/2020  A1c goal: <7% Counseling provided: A1c and blood sugar goals; Complications of diabetes including kidney damage, retinal damage, and cardiovascular disease; Prevention and management of hypoglycemic episodes; Benefits of routine self-monitoring of blood sugar; Counseled to check feet daily and get yearly eye exams Reviewed medications with patient and discussed importance of medication adherence Discussed plans with patient for ongoing care management follow up and provided patient with direct contact information for care management team      Plan: Telephone follow up appointment with care management team member scheduled for:  03/15/21_0   [name], RN, BSN, CCM Care Management Coordinator [facility] Phone: [phone]: [phone]

## 2021-02-11 ENCOUNTER — Encounter: Payer: Medicare Other | Admitting: Internal Medicine

## 2021-02-11 ENCOUNTER — Telehealth: Payer: Self-pay | Admitting: *Deleted

## 2021-02-11 NOTE — Telephone Encounter (Signed)
Called patient lvm for patient to return call to clinic to reschedule this missed appointment. To call [phone].

## 2021-03-03 ENCOUNTER — Encounter: Payer: Medicare Other | Admitting: Internal Medicine

## 2021-03-15 ENCOUNTER — Ambulatory Visit: Payer: Medicare Other

## 2021-03-15 NOTE — Patient Instructions (Signed)
Visit Information  Thank you for taking time to visit with me today. Please don't hesitate to contact me if I can be of assistance to you before our next scheduled telephone appointment.  Our next appointment is after clinic visit.  Please call the care guide team at [phone] if you need to cancel or reschedule your appointment.   If you are experiencing a Mental Health or [place] or need someone to talk to, please call the [place] National Suicide Prevention Lifeline: (931)707-3430 or TTY: 931-020-3917 TTY 279-558-2038) to talk to a trained counselor   The patient verbalized understanding of instructions, educational materials, and care plan provided today and declined offer to receive copy of patient instructions, educational materials, and care plan.   The patient has been provided with contact information for the care management team and has been advised to call with any health related questions or concerns.   [name], RN, BSN, CCM Care Management Coordinator [facility] Phone: [phone]: [phone]

## 2021-03-15 NOTE — Chronic Care Management (AMB) (Signed)
Care Management    RN Visit Note  03/15/2021 Name: Mark Cabrera MRN: [id number] DOB: 04-20-1950  Subjective: Mark Cabrera is a 71 y.o. year old male who is a primary care patient of Mark Dame, MD. The care management team was consulted for assistance with disease management and care coordination needs.    Engaged with patient by telephone for follow up visit in response to provider referral for case management and/or care coordination services.   Consent to Services:   [name] was given information about Care Management services today including:  Care Management services includes personalized support from designated clinical staff supervised by his physician, including individualized plan of care and coordination with other care providers 24/7 contact phone numbers for assistance for urgent and routine care needs. The patient may stop case management services at any time by phone call to the office staff.  Patient agreed to services and consent obtained.   Assessment: Review of patient past medical history, allergies, medications, health status, including review of consultants reports, laboratory and other test data, was performed as part of comprehensive evaluation and provision of chronic care management services.   SDOH (Social Determinants of Health) assessments and interventions performed:    Care Plan  Allergies  Allergen Reactions   Morpholine Salicylate Other (See Comments)    Hallucinations   Penicillins Other (See Comments)    Allergic- reaction?? Has patient had a PCN reaction causing immediate rash, facial/tongue/throat swelling, SOB or lightheadedness with hypotension: Unk Has patient had a PCN reaction causing severe rash involving mucus membranes or skin necrosis: Unk Has patient had a PCN reaction that required hospitalization: Unk Has patient had a PCN reaction occurring within the last 10 years: No If all of the above answers are "NO", then may proceed  with Cephalosporin use.  Other reaction(s): Unknown   Morphine And Related Other (See Comments)    Hallucinations    Sglt2 Inhibitors Rash and Other (See Comments)    Candidiasis infection prone   Statins Rash and Other (See Comments)    Severe rash and back pain, and muscle pain    Outpatient Encounter Medications as of 03/15/2021  Medication Sig   Accu-Chek Softclix Lancets lancets Use as instructed   acetaminophen (TYLENOL) 325 MG tablet Take 2 tablets (650 mg total) by mouth every 4 (four) hours as needed for headache or mild pain.   apixaban (ELIQUIS) 5 MG TABS tablet Take 1 tablet by mouth twice daily   ciprofloxacin-dexamethasone (CIPRODEX) OTIC suspension Place 4 drops into the left ear 2 (two) times daily.   escitalopram (LEXAPRO) 10 MG tablet Take 1 tablet (10 mg total) by mouth daily.   glucose blood (ACCU-CHEK GUIDE) test strip Use as instructed   insulin aspart (NOVOLOG FLEXPEN) 100 UNIT/ML FlexPen INJECT 18 UNITS UNDER THE SKIN THREE TIMES DAILY WITH MEALS (Patient taking differently: Inject 18 Units into the skin 3 (three) times daily with meals.)   Insulin Pen Needle 31G X 5 MM MISC Use one pen needle three times daily. Dx E11.49   lisinopril (ZESTRIL) 10 MG tablet Take 1 tablet (10 mg total) by mouth daily.   metFORMIN (GLUCOPHAGE) 500 MG tablet Take 1 tablet (500 mg total) by mouth 2 (two) times daily with a meal.   REPATHA SURECLICK 254 MG/ML SOAJ INJECT 1 PEN INTO THE SKIN EVERY 14 (FOURTEEN) DAYS. (Patient taking differently: Inject 1 pen into the skin every 14 (fourteen) days.)   SOLIQUA 100-33 UNT-MCG/ML SOPN Inject 40  Units into the skin daily.   SOLIQUA 100-33 UNT-MCG/ML SOPN INJECT 40 UNITS SUBCUTANEOUSLY ONCE DAILY   sulfamethoxazole-trimethoprim (BACTRIM DS) 800-160 MG tablet Take 1 tablet by mouth 2 (two) times daily.   valACYclovir (VALTREX) 1000 MG tablet Take 1 tablet (1,000 mg total) by mouth 2 (two) times daily.   vitamin B-12 (CYANOCOBALAMIN) 100 MCG  tablet Take 100 mcg by mouth daily.   No facility-administered encounter medications on file as of 03/15/2021.    Patient Active Problem List   Diagnosis Date Noted   Otitis externa 12/31/2020   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   Balanitis 40/81/4481   Complicated urinary tract infection    Thrombocytopenia (HCC)    Hyponatremia 11/08/2020   Left shoulder pain 10/20/2020   Dyspnea 09/23/2020   Mild cognitive impairment 08/25/2020   Hip pain, chronic, left 07/05/2020   Epididymitis 04/14/2020   Fatigue 03/18/2020   Hypotension 03/10/2020   Type 2 diabetes mellitus with hyperglycemia, with long-term current use of insulin ([place]) 02/24/2020   Abdominal wall bulge 12/03/2019   Dermoid inclusion cyst 08/07/2019   Chronic left shoulder pain 05/13/2019   Depression 11/05/2018   Blurry vision, bilateral 09/20/2018   CVA (cerebral vascular accident) ([place]) 06/28/2018   Hepatic cirrhosis ([place]) 06/29/2017   Healthcare maintenance 12/16/2016   Lipoma of left upper extremity 09/12/2016   Chronic knee pain 07/04/2016   Decreased visual acuity 07/04/2016   Pleural effusion on left, s/p throacentesis 04/18/16 04/28/2016   Paroxysmal atrial fibrillation ([place]) 04/16/2016   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/31/2016   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   CAD (coronary artery disease), native coronary artery    Chronic diastolic heart failure (HCC)    Sleep apnea 08/02/2009   Diabetes mellitus with neurological manifestation ([place]) 10/18/2007   Dyslipidemia 05/18/2006   Hypertensive heart disease     Conditions to be addressed/monitored: CAD, HTN, and DMII  Care Plan : Coronary Artery Disease (Adult)  Updates made by [name], RN since 03/15/2021 12:00 AM     Problem: Disease Progression (Coronary Artery Disease)      Long-Range Goal: Disease Progression Prevented or Minimized   Start Date: 12/08/2020  Note:   Current Barriers: Successful outreach to patient this afternoon.  He  continues to be non-compliant with taking his CBG readings, shared with this RNCM he has not taken a reading in many days and this is the second month he has shared this information..  Educated patient on the need to monitor his CBG.  Patients last A1C was 11.  Patient knows he is overdue to come in to the office as he has c/o left eye blurry, his balance is poor and he seems to be more forgetful.  Spoke with MD Gilford Rile who stated the patient was not eligible for a telephone visit, he needs to come in for labs and evaluation.  Message sent to [facility] front desk pool to contact patient. Knowledge Deficits related to plan of care for management of HTN and DMII  Chronic Disease Management support and education needs related to HTN and DMII  Financial Constraints  Transportation barriers  RNCM Clinical Goal(s):  Patient will verbalize understanding of plan for management of HTN and DMII as evidenced by open discussions with RNCM and PCP. take all medications exactly as prescribed and will call provider for medication related questions as evidenced by compliance with medications. demonstrate Ongoing adherence to prescribed treatment plan for HTN and DMII as evidenced by ability to discuss your  CBG readings. continue to work with RN Care Manager to address care management and care coordination needs related to  HTN and DMII as evidenced by adherence to CM Team Scheduled appointments through collaboration with RN Care manager, provider, and care team.   Interventions: 1:1 collaboration with primary care provider regarding development and update of comprehensive plan of care as evidenced by provider attestation and co-signature Inter-disciplinary care team collaboration (see longitudinal plan of care) Evaluation of current treatment plan related to  self management and patient's adherence to plan as established by provider   Health Maintenance Interventions:  (Status:  Goal on track:  NO.) Long Term  Goal Patient interviewed about adult health maintenance status including  Regular eye checkups Regular Dental Care    Diabetes Eye Exam    Blood Pressure    Hemoglobin A1c    Diabetes Foot Exam    Advised patient to discuss  Regular eye checkups Regular Dental Care    Diabetes Eye Exam    Blood Pressure    Hemoglobin A1c    Diabetes Foot Exam    with primary care provider    Diabetes Interventions:  (Status:  Goal on track:  NO.) Long Term Goal Assessed patient's understanding of A1c goal: <7% Provided education to patient about basic DM disease process Reviewed medications with patient and discussed importance of medication adherence Counseled on importance of regular laboratory monitoring as prescribed Discussed plans with patient for ongoing care management follow up and provided patient with direct contact information for care management team Lab Results  Component Value Date   HGBA1C 11.8 (H) 12/23/2020   Hypertension Interventions:  (Status:  Goal on track:  NO.) Long Term Goal Last practice recorded BP readings:  BP Readings from Last 3 Encounters:  12/30/20 117/77  12/24/20 (!) 156/76  12/23/20 105/68  Most recent eGFR/CrCl:  Lab Results  Component Value Date   EGFR 75 10/20/2020    No components found for: CRCL  Evaluation of current treatment plan related to hypertension self management and patient's adherence to plan as established by provider Reviewed medications with patient and discussed importance of compliance Discussed plans with patient for ongoing care management follow up and provided patient with direct contact information for care management team Reviewed scheduled/upcoming provider appointments including:   Patient Goals/Self-Care Activities: Take all medications as prescribed Attend all scheduled provider appointments Call pharmacy for medication refills 3-7 days in advance of running out of medications Perform all self care activities  independently  Call provider office for new concerns or questions   Follow Up Plan:  Telephone follow up appointment with care management team member scheduled for:  30 days       Care Plan : Diabetes Type 2 (Adult)  Updates made by [name], RN since 03/15/2021 12:00 AM     Problem: Disease Progression (Diabetes, Type 2)      Long-Range Goal: Disease Progression Prevented or Minimized   Start Date: 12/31/2020  Recent Progress: Not on track  Note:   Diabetes: Patient noted that he has not checked his CBG readings in several days.  He is non-compliant with monitoring his insulin dependent diabetes. Lab Results  Component Value Date   HGBA1C 11.8 (H) 12/23/2020  A1c goal: <7% Counseling provided: A1c and blood sugar goals; Complications of diabetes including kidney damage, retinal damage, and cardiovascular disease; Prevention and management of hypoglycemic episodes; Benefits of routine self-monitoring of blood sugar; Counseled to check feet daily and get yearly eye exams Reviewed  medications with patient and discussed importance of medication adherence Discussed plans with patient for ongoing care management follow up and provided patient with direct contact information for care management team      Plan: The patient has been provided with contact information for the care management team and has been advised to call with any health related questions or concerns.   [name], RN, BSN, CCM Care Management Coordinator [facility] Phone: [phone]: [phone]

## 2021-03-18 ENCOUNTER — Encounter: Payer: Medicare Other | Admitting: Internal Medicine

## 2021-03-18 ENCOUNTER — Telehealth: Payer: Medicare Other | Admitting: Internal Medicine

## 2021-03-29 ENCOUNTER — Telehealth: Payer: Self-pay | Admitting: *Deleted

## 2021-03-29 NOTE — Telephone Encounter (Signed)
Patient called in stating he was treated for a left ear infection "about a month ago." Bactrim DS and Ciprodex otic suspension last sent 12/30/20. States his ear improved at that time then pain returned to left ear 2 days ago. Denies fever, chills, nasal drainage, cough. Ciprodex sent on 12/30/20 with 2 refills. He will call pharmacy now to see if he still has refills left. Please advise if there are any other recommendations. Patient unable to come to clinic 2/2 transportation issues.

## 2021-03-30 ENCOUNTER — Telehealth: Payer: Self-pay

## 2021-03-30 NOTE — Telephone Encounter (Signed)
Lmom pt to bring insurance since it changed to [place] to proceed w/[facility].

## 2021-04-01 ENCOUNTER — Other Ambulatory Visit: Payer: Self-pay

## 2021-04-01 DIAGNOSIS — H60502 Unspecified acute noninfective otitis externa, left ear: Secondary | ICD-10-CM

## 2021-04-01 MED ORDER — CIPROFLOXACIN-DEXAMETHASONE 0.3-0.1 % OT SUSP: 4.0000 [drp] | Freq: Two times a day (BID) | OTIC | 2 refills | Status: DC

## 2021-05-20 DIAGNOSIS — Z20822 Contact with and (suspected) exposure to covid-19: Secondary | ICD-10-CM | POA: Diagnosis not present

## 2021-06-03 DIAGNOSIS — Z20822 Contact with and (suspected) exposure to covid-19: Secondary | ICD-10-CM | POA: Diagnosis not present

## 2021-06-28 DIAGNOSIS — Z20822 Contact with and (suspected) exposure to covid-19: Secondary | ICD-10-CM | POA: Diagnosis not present

## 2021-07-05 DIAGNOSIS — Z20822 Contact with and (suspected) exposure to covid-19: Secondary | ICD-10-CM | POA: Diagnosis not present

## 2021-07-12 DIAGNOSIS — Z20822 Contact with and (suspected) exposure to covid-19: Secondary | ICD-10-CM | POA: Diagnosis not present

## 2021-07-21 DIAGNOSIS — Z20822 Contact with and (suspected) exposure to covid-19: Secondary | ICD-10-CM | POA: Diagnosis not present

## 2021-09-14 ENCOUNTER — Other Ambulatory Visit: Payer: Self-pay

## 2021-09-14 DIAGNOSIS — E1140 Type 2 diabetes mellitus with diabetic neuropathy, unspecified: Secondary | ICD-10-CM

## 2021-09-14 MED ORDER — INSULIN PEN NEEDLE 31G X 5 MM MISC: 3 refills | Status: DC

## 2021-09-20 ENCOUNTER — Other Ambulatory Visit (HOSPITAL_COMMUNITY): Payer: Self-pay

## 2021-09-20 ENCOUNTER — Telehealth: Payer: Self-pay

## 2021-09-20 ENCOUNTER — Telehealth: Payer: Self-pay | Admitting: *Deleted

## 2021-09-20 NOTE — Telephone Encounter (Signed)
RTC to patient's daughter.  Appointment given to see Dr. Collene Gobble on 09/21/2021 to discuss options for patient.

## 2021-09-20 NOTE — Telephone Encounter (Signed)
Pt's daughter requesting to speak with a nurse regarding her father. Please call back.

## 2021-09-20 NOTE — Telephone Encounter (Signed)
RTC to patient's daughter [name].  Concerned that patient has not been coming for visits.  Did well check on patient on yesterday and patient has not had his sugars checked in a while.  Concerned about patient being able to care for self.  Would like to see about getting guardianship of patient as he may have dementia as well and possible placement.  Given an appointment for tomorrow with PCP.  Daughter concerned that if patient refuses to come in  could referrals could be started,  Daughter tomorrow if problems getting patient to come in to see doctor if a televisit can be done.

## 2021-09-21 ENCOUNTER — Other Ambulatory Visit: Payer: Self-pay

## 2021-09-21 ENCOUNTER — Observation Stay (HOSPITAL_COMMUNITY): Payer: Medicare Other

## 2021-09-21 ENCOUNTER — Ambulatory Visit (INDEPENDENT_AMBULATORY_CARE_PROVIDER_SITE_OTHER): Payer: Medicare Other | Admitting: Student

## 2021-09-21 ENCOUNTER — Inpatient Hospital Stay (HOSPITAL_COMMUNITY)
Admission: AD | Admit: 2021-09-21 | Discharge: 2021-09-28 | DRG: 312 | Disposition: A | Discharge status: Skilled Nursing Facility | Payer: Medicare Other | Source: Ambulatory Visit | Attending: Internal Medicine | Admitting: Internal Medicine

## 2021-09-21 ENCOUNTER — Encounter: Payer: Self-pay | Admitting: Student

## 2021-09-21 VITALS — BP 99/68 | HR 60 | Temp 97.8°F | Ht 68.0 in | Wt 174.0 lb

## 2021-09-21 DIAGNOSIS — K5900 Constipation, unspecified: Secondary | ICD-10-CM | POA: Diagnosis present

## 2021-09-21 DIAGNOSIS — R1312 Dysphagia, oropharyngeal phase: Secondary | ICD-10-CM | POA: Diagnosis not present

## 2021-09-21 DIAGNOSIS — I9589 Other hypotension: Secondary | ICD-10-CM | POA: Diagnosis not present

## 2021-09-21 DIAGNOSIS — I110 Hypertensive heart disease with heart failure: Secondary | ICD-10-CM | POA: Diagnosis present

## 2021-09-21 DIAGNOSIS — E861 Hypovolemia: Secondary | ICD-10-CM

## 2021-09-21 DIAGNOSIS — Z794 Long term (current) use of insulin: Secondary | ICD-10-CM

## 2021-09-21 DIAGNOSIS — E785 Hyperlipidemia, unspecified: Secondary | ICD-10-CM | POA: Diagnosis present

## 2021-09-21 DIAGNOSIS — G3184 Mild cognitive impairment, so stated: Secondary | ICD-10-CM | POA: Diagnosis not present

## 2021-09-21 DIAGNOSIS — R911 Solitary pulmonary nodule: Secondary | ICD-10-CM | POA: Diagnosis present

## 2021-09-21 DIAGNOSIS — D709 Neutropenia, unspecified: Secondary | ICD-10-CM

## 2021-09-21 DIAGNOSIS — K7682 Hepatic encephalopathy: Secondary | ICD-10-CM | POA: Diagnosis present

## 2021-09-21 DIAGNOSIS — I5032 Chronic diastolic (congestive) heart failure: Secondary | ICD-10-CM

## 2021-09-21 DIAGNOSIS — Z880 Allergy status to penicillin: Secondary | ICD-10-CM | POA: Diagnosis not present

## 2021-09-21 DIAGNOSIS — Z5948 Other specified lack of adequate food: Secondary | ICD-10-CM

## 2021-09-21 DIAGNOSIS — Z888 Allergy status to other drugs, medicaments and biological substances status: Secondary | ICD-10-CM | POA: Diagnosis not present

## 2021-09-21 DIAGNOSIS — K7460 Unspecified cirrhosis of liver: Secondary | ICD-10-CM | POA: Diagnosis not present

## 2021-09-21 DIAGNOSIS — R4189 Other symptoms and signs involving cognitive functions and awareness: Secondary | ICD-10-CM | POA: Diagnosis present

## 2021-09-21 DIAGNOSIS — N3289 Other specified disorders of bladder: Secondary | ICD-10-CM | POA: Diagnosis not present

## 2021-09-21 DIAGNOSIS — Z953 Presence of xenogenic heart valve: Secondary | ICD-10-CM

## 2021-09-21 DIAGNOSIS — I480 Paroxysmal atrial fibrillation: Secondary | ICD-10-CM | POA: Diagnosis present

## 2021-09-21 DIAGNOSIS — I2510 Atherosclerotic heart disease of native coronary artery without angina pectoris: Secondary | ICD-10-CM | POA: Diagnosis present

## 2021-09-21 DIAGNOSIS — Z79899 Other long term (current) drug therapy: Secondary | ICD-10-CM

## 2021-09-21 DIAGNOSIS — Z6827 Body mass index (BMI) 27.0-27.9, adult: Secondary | ICD-10-CM

## 2021-09-21 DIAGNOSIS — E860 Dehydration: Secondary | ICD-10-CM | POA: Diagnosis present

## 2021-09-21 DIAGNOSIS — R296 Repeated falls: Secondary | ICD-10-CM | POA: Diagnosis present

## 2021-09-21 DIAGNOSIS — E1149 Type 2 diabetes mellitus with other diabetic neurological complication: Secondary | ICD-10-CM | POA: Diagnosis present

## 2021-09-21 DIAGNOSIS — N481 Balanitis: Secondary | ICD-10-CM | POA: Diagnosis present

## 2021-09-21 DIAGNOSIS — R509 Fever, unspecified: Secondary | ICD-10-CM | POA: Diagnosis not present

## 2021-09-21 DIAGNOSIS — Z91148 Patient's other noncompliance with medication regimen for other reason: Secondary | ICD-10-CM

## 2021-09-21 DIAGNOSIS — M6281 Muscle weakness (generalized): Secondary | ICD-10-CM | POA: Diagnosis not present

## 2021-09-21 DIAGNOSIS — B999 Unspecified infectious disease: Secondary | ICD-10-CM

## 2021-09-21 DIAGNOSIS — R0602 Shortness of breath: Secondary | ICD-10-CM | POA: Diagnosis not present

## 2021-09-21 DIAGNOSIS — I951 Orthostatic hypotension: Principal | ICD-10-CM | POA: Diagnosis present

## 2021-09-21 DIAGNOSIS — Z885 Allergy status to narcotic agent status: Secondary | ICD-10-CM | POA: Diagnosis not present

## 2021-09-21 DIAGNOSIS — E440 Moderate protein-calorie malnutrition: Secondary | ICD-10-CM | POA: Diagnosis not present

## 2021-09-21 DIAGNOSIS — F01511 Vascular dementia, unspecified severity, with agitation: Secondary | ICD-10-CM | POA: Diagnosis present

## 2021-09-21 DIAGNOSIS — K449 Diaphragmatic hernia without obstruction or gangrene: Secondary | ICD-10-CM | POA: Diagnosis not present

## 2021-09-21 DIAGNOSIS — R2681 Unsteadiness on feet: Secondary | ICD-10-CM | POA: Diagnosis not present

## 2021-09-21 DIAGNOSIS — Z833 Family history of diabetes mellitus: Secondary | ICD-10-CM

## 2021-09-21 DIAGNOSIS — Z8673 Personal history of transient ischemic attack (TIA), and cerebral infarction without residual deficits: Secondary | ICD-10-CM | POA: Diagnosis not present

## 2021-09-21 DIAGNOSIS — Z951 Presence of aortocoronary bypass graft: Secondary | ICD-10-CM | POA: Diagnosis not present

## 2021-09-21 DIAGNOSIS — E1165 Type 2 diabetes mellitus with hyperglycemia: Secondary | ICD-10-CM | POA: Diagnosis present

## 2021-09-21 DIAGNOSIS — G4733 Obstructive sleep apnea (adult) (pediatric): Secondary | ICD-10-CM | POA: Diagnosis present

## 2021-09-21 DIAGNOSIS — K219 Gastro-esophageal reflux disease without esophagitis: Secondary | ICD-10-CM | POA: Diagnosis present

## 2021-09-21 DIAGNOSIS — E1140 Type 2 diabetes mellitus with diabetic neuropathy, unspecified: Secondary | ICD-10-CM

## 2021-09-21 DIAGNOSIS — F0154 Vascular dementia, unspecified severity, with anxiety: Secondary | ICD-10-CM | POA: Diagnosis present

## 2021-09-21 DIAGNOSIS — M6259 Muscle wasting and atrophy, not elsewhere classified, multiple sites: Secondary | ICD-10-CM | POA: Diagnosis not present

## 2021-09-21 DIAGNOSIS — E119 Type 2 diabetes mellitus without complications: Secondary | ICD-10-CM

## 2021-09-21 DIAGNOSIS — D61818 Other pancytopenia: Secondary | ICD-10-CM | POA: Diagnosis present

## 2021-09-21 DIAGNOSIS — R41841 Cognitive communication deficit: Secondary | ICD-10-CM | POA: Diagnosis not present

## 2021-09-21 DIAGNOSIS — Z7984 Long term (current) use of oral hypoglycemic drugs: Secondary | ICD-10-CM

## 2021-09-21 DIAGNOSIS — Z7901 Long term (current) use of anticoagulants: Secondary | ICD-10-CM

## 2021-09-21 DIAGNOSIS — R0902 Hypoxemia: Secondary | ICD-10-CM | POA: Diagnosis not present

## 2021-09-21 DIAGNOSIS — I959 Hypotension, unspecified: Secondary | ICD-10-CM | POA: Diagnosis present

## 2021-09-21 DIAGNOSIS — K5790 Diverticulosis of intestine, part unspecified, without perforation or abscess without bleeding: Secondary | ICD-10-CM | POA: Diagnosis not present

## 2021-09-21 DIAGNOSIS — Z8249 Family history of ischemic heart disease and other diseases of the circulatory system: Secondary | ICD-10-CM

## 2021-09-21 DIAGNOSIS — R5381 Other malaise: Secondary | ICD-10-CM | POA: Diagnosis present

## 2021-09-21 DIAGNOSIS — R278 Other lack of coordination: Secondary | ICD-10-CM | POA: Diagnosis not present

## 2021-09-21 LAB — CBC WITH DIFFERENTIAL/PLATELET
Abs Immature Granulocytes: 0.02 10*3/uL (ref 0.00–0.07)
Basophils Absolute: 0 10*3/uL (ref 0.0–0.1)
Basophils Relative: 0 %
Eosinophils Absolute: 0 10*3/uL (ref 0.0–0.5)
Eosinophils Relative: 1 %
HCT: 32 % — ABNORMAL LOW (ref 39.0–52.0)
Hemoglobin: 11 g/dL — ABNORMAL LOW (ref 13.0–17.0)
Immature Granulocytes: 1 %
Lymphocytes Relative: 36 %
Lymphs Abs: 0.9 10*3/uL (ref 0.7–4.0)
MCH: 30.9 pg (ref 26.0–34.0)
MCHC: 34.4 g/dL (ref 30.0–36.0)
MCV: 89.9 fL (ref 80.0–100.0)
Monocytes Absolute: 0.1 10*3/uL (ref 0.1–1.0)
Monocytes Relative: 4 %
Neutro Abs: 1.5 10*3/uL — ABNORMAL LOW (ref 1.7–7.7)
Neutrophils Relative %: 58 %
Platelets: 222 10*3/uL (ref 150–400)
RBC: 3.56 MIL/uL — ABNORMAL LOW (ref 4.22–5.81)
RDW: 13.4 % (ref 11.5–15.5)
WBC: 2.6 10*3/uL — ABNORMAL LOW (ref 4.0–10.5)
nRBC: 0 % (ref 0.0–0.2)

## 2021-09-21 LAB — COMPREHENSIVE METABOLIC PANEL
ALT: 20 U/L (ref 0–44)
AST: 24 U/L (ref 15–41)
Albumin: 3.1 g/dL — ABNORMAL LOW (ref 3.5–5.0)
Alkaline Phosphatase: 49 U/L (ref 38–126)
Anion gap: 12 (ref 5–15)
BUN: 15 mg/dL (ref 8–23)
CO2: 24 mmol/L (ref 22–32)
Calcium: 8.9 mg/dL (ref 8.9–10.3)
Chloride: 100 mmol/L (ref 98–111)
Creatinine, Ser: 1.08 mg/dL (ref 0.61–1.24)
GFR, Estimated: >60 mL/min (ref >60)
Glucose, Bld: 305 mg/dL — ABNORMAL HIGH (ref 70–99)
Potassium: 3.4 mmol/L — ABNORMAL LOW (ref 3.5–5.1)
Sodium: 136 mmol/L (ref 135–145)
Total Bilirubin: 0.4 mg/dL (ref 0.3–1.2)
Total Protein: 5.6 g/dL — ABNORMAL LOW (ref 6.5–8.1)

## 2021-09-21 LAB — URINALYSIS, ROUTINE W REFLEX MICROSCOPIC
Bilirubin Urine: NEGATIVE
Glucose, UA: >=500 mg/dL — AB
Hgb urine dipstick: NEGATIVE
Ketones, ur: NEGATIVE mg/dL
Leukocytes,Ua: NEGATIVE
Nitrite: NEGATIVE
Protein, ur: NEGATIVE mg/dL
Specific Gravity, Urine: 1.011 (ref 1.005–1.030)
pH: 5 (ref 5.0–8.0)

## 2021-09-21 LAB — GLUCOSE, CAPILLARY
Glucose-Capillary: 450 mg/dL — ABNORMAL HIGH (ref 70–99)
Glucose-Capillary: 255 mg/dL — ABNORMAL HIGH (ref 70–99)
Glucose-Capillary: 299 mg/dL — ABNORMAL HIGH (ref 70–99)
Glucose-Capillary: 350 mg/dL — ABNORMAL HIGH (ref 70–99)

## 2021-09-21 LAB — POCT GLYCOSYLATED HEMOGLOBIN (HGB A1C): HbA1c POC (<> result, manual entry): >14 % (ref 4.0–5.6)

## 2021-09-21 LAB — BRAIN NATRIURETIC PEPTIDE: B Natriuretic Peptide: 317.3 pg/mL — ABNORMAL HIGH (ref 0.0–100.0)

## 2021-09-21 MED ORDER — LACTATED RINGERS IV BOLUS
1000.0000 mL | Freq: Once | INTRAVENOUS | Status: AC
  Administered 2021-09-21: 1000 mL via INTRAVENOUS

## 2021-09-21 MED ORDER — THIAMINE HCL 100 MG PO TABS
100.0000 mg | ORAL_TABLET | Freq: Every day | ORAL | Status: DC
  Administered 2021-09-21 – 2021-09-28 (×8): 100 mg via ORAL
  Filled 2021-09-21 (×8): qty 1

## 2021-09-21 MED ORDER — INSULIN ASPART 100 UNIT/ML IJ SOLN
0.0000 [IU] | Freq: Three times a day (TID) | INTRAMUSCULAR | Status: DC
  Administered 2021-09-22: 3 [IU] via SUBCUTANEOUS
  Administered 2021-09-22 (×2): 5 [IU] via SUBCUTANEOUS
  Administered 2021-09-23: 8 [IU] via SUBCUTANEOUS
  Administered 2021-09-23 (×2): 5 [IU] via SUBCUTANEOUS
  Administered 2021-09-24 (×2): 8 [IU] via SUBCUTANEOUS
  Administered 2021-09-24 – 2021-09-25 (×2): 11 [IU] via SUBCUTANEOUS
  Administered 2021-09-25: 5 [IU] via SUBCUTANEOUS
  Administered 2021-09-25 – 2021-09-26 (×4): 11 [IU] via SUBCUTANEOUS
  Administered 2021-09-27 (×2): 8 [IU] via SUBCUTANEOUS
  Administered 2021-09-27: 11 [IU] via SUBCUTANEOUS
  Administered 2021-09-28: 3 [IU] via SUBCUTANEOUS
  Administered 2021-09-28: 5 [IU] via SUBCUTANEOUS

## 2021-09-21 MED ORDER — INSULIN ASPART 100 UNIT/ML IJ SOLN
0.0000 [IU] | Freq: Every day | INTRAMUSCULAR | Status: DC
  Administered 2021-09-21: 4 [IU] via SUBCUTANEOUS
  Administered 2021-09-22: 3 [IU] via SUBCUTANEOUS
  Administered 2021-09-23: 4 [IU] via SUBCUTANEOUS
  Administered 2021-09-24 – 2021-09-25 (×2): 3 [IU] via SUBCUTANEOUS
  Administered 2021-09-26: 4 [IU] via SUBCUTANEOUS
  Administered 2021-09-27: 3 [IU] via SUBCUTANEOUS

## 2021-09-21 MED ORDER — FOLIC ACID 1 MG PO TABS
1.0000 mg | ORAL_TABLET | Freq: Every day | ORAL | Status: DC
  Administered 2021-09-21 – 2021-09-28 (×8): 1 mg via ORAL
  Filled 2021-09-21 (×8): qty 1

## 2021-09-21 MED ORDER — LACTATED RINGERS IV BOLUS
500.0000 mL | Freq: Once | INTRAVENOUS | Status: AC
  Administered 2021-09-21: 500 mL via INTRAVENOUS

## 2021-09-21 MED ORDER — ACETAMINOPHEN 325 MG PO TABS
650.0000 mg | ORAL_TABLET | Freq: Four times a day (QID) | ORAL | Status: DC | PRN
Start: 2021-09-21 — End: 2021-09-24
  Administered 2021-09-23 – 2021-09-24 (×3): 650 mg via ORAL
  Filled 2021-09-21 (×3): qty 2

## 2021-09-21 MED ORDER — ADULT MULTIVITAMIN W/MINERALS CH
1.0000 | ORAL_TABLET | Freq: Every day | ORAL | Status: DC
  Administered 2021-09-21 – 2021-09-28 (×8): 1 via ORAL
  Filled 2021-09-21 (×8): qty 1

## 2021-09-21 MED ORDER — APIXABAN 5 MG PO TABS
5.0000 mg | ORAL_TABLET | Freq: Two times a day (BID) | ORAL | Status: DC
  Administered 2021-09-21 – 2021-09-28 (×14): 5 mg via ORAL
  Filled 2021-09-21 (×14): qty 1

## 2021-09-21 MED ORDER — LACTATED RINGERS IV BOLUS
500.0000 mL | Freq: Once | INTRAVENOUS | Status: DC
Start: 2021-09-21 — End: 2021-09-21

## 2021-09-21 MED ORDER — INSULIN GLARGINE-YFGN 100 UNIT/ML ~~LOC~~ SOLN
15.0000 [IU] | Freq: Every day | SUBCUTANEOUS | Status: DC
  Administered 2021-09-21 – 2021-09-22 (×2): 15 [IU] via SUBCUTANEOUS
  Filled 2021-09-21 (×3): qty 0.15

## 2021-09-21 MED ORDER — LACTATED RINGERS IV SOLN
INTRAVENOUS | Status: DC
  Administered 2021-09-21: 75 mL via INTRAVENOUS

## 2021-09-21 MED ORDER — LACTATED RINGERS IV BOLUS
250.0000 mL | Freq: Once | INTRAVENOUS | Status: DC
Start: 2021-09-21 — End: 2021-09-21

## 2021-09-21 MED ORDER — ESCITALOPRAM OXALATE 10 MG PO TABS: 10.0000 mg | ORAL_TABLET | Freq: Every day | ORAL | Status: DC

## 2021-09-21 MED ORDER — ACETAMINOPHEN 650 MG RE SUPP: 650.0000 mg | Freq: Four times a day (QID) | RECTAL | Status: DC | PRN

## 2021-09-21 MED ORDER — SENNOSIDES-DOCUSATE SODIUM 8.6-50 MG PO TABS
1.0000 | ORAL_TABLET | Freq: Every evening | ORAL | Status: DC | PRN
Start: 2021-09-21 — End: 2021-09-25

## 2021-09-21 MED ORDER — VALACYCLOVIR HCL 500 MG PO TABS: 1000.0000 mg | ORAL_TABLET | Freq: Two times a day (BID) | ORAL | Status: DC

## 2021-09-21 NOTE — H&P (Cosign Needed Addendum)
Date: 09/21/2021               Patient Name:  Mark Cabrera MRN: [id number]  DOB: 1951-02-26 Age / Sex: 71 y.o., male   PCP: [name], MD              Medical Service: Internal Medicine Teaching Service              Attending Physician: Dr. Charise Killian, MD    First Contact: [name], MS  Pager: [phone]  Second Contact: Dr. Romana Juniper Pager: [phone]  Third Contact Dr. Virl Axe Pager: [phone]       After Hours (After 5p/  First Contact Pager: [phone]  weekends / holidays): Second Contact Pager: [phone]   Chief Complaint: Dizziness  History of Present Illness: Mark Cabrera is a 71 yo male with the PMH of DM, HLD, HTN, diastolic HF, CAD s/p CABG, A-fib, sleep apnea, cirrhosis, and CVA presenting to the hospital for dizziness and malaise for the past 2 weeks. Pt presented to the [facility] today for a diabetes check up and was found to be hypotensive to 80/50 with an A1c of 14 in which he was directly admitted to the hospital from clinic. He received 1L of IV fluid in the clinic. Pt reports feeling overall unwell for the past 2 weeks. He endorses feeling weak and lightheaded with standing. He reports over the past 2 years that his memory had continued to decrease. He often feels like he forgets to take care of himself. He reports feeling more thirsty than normal and urinating more. He endorses feeling swollen in his legs. He states he is able to lay flat but sometimes feels like he has to gasp for air. He reports having a BM 2-3 times a week. Pt also reporting feelings of an infection around the foreskin of his penis. He denies any drainage from same. He denies any chest pain, palpitations, abdominal pain, dysuria, fevers, cough, nausea, or frequency. He denies any recent GI illnesses or sick contacts.   Daughter is also present with the patient and reports gradually worsening confusion. She is concerned that the patient is unable to  complete his IADLs and ADLs and is asking for nursing home placement. She reports 3 previous admissions to assisted living facilities in which the patient will check himself out and not return. She fills the patients medication boxes and reports he often has only taken 1-2 days out of the week. His memory issues have significantly worsened, as she reports on multiple occassions he has taken insulin multiple times within an hour because he forgot that he already took it. He will also call his daughter many times throughout the day, often forgetting their previous conversation. Mentions large mood swings, often lashing out at others but quickly will apologize a few minutes later. She mentions she called APS yesterday due to poor living conditions, but patient refused to answer the door for the social worker. She does not believe that he has bathed himself in a few weeks.   Meds:  No current facility-administered medications on file prior to encounter.   Current Outpatient Medications on File Prior to Encounter  Medication Sig Dispense Refill   Accu-Chek Softclix Lancets lancets Use as instructed 100 each 12   acetaminophen (TYLENOL) 325 MG tablet Take 2 tablets (650 mg total) by mouth every 4 (four) hours as needed for headache or mild pain.     apixaban (ELIQUIS) 5 MG TABS  tablet Take 1 tablet by mouth twice daily 180 tablet 1   ciprofloxacin-dexamethasone (CIPRODEX) OTIC suspension Place 4 drops into the left ear 2 (two) times daily. 7.5 mL 2   escitalopram (LEXAPRO) 10 MG tablet Take 1 tablet (10 mg total) by mouth daily. 90 tablet 0   glucose blood (ACCU-CHEK GUIDE) test strip Use as instructed 100 each 12   insulin aspart (NOVOLOG FLEXPEN) 100 UNIT/ML FlexPen INJECT 18 UNITS UNDER THE SKIN THREE TIMES DAILY WITH MEALS (Patient taking differently: Inject 18 Units into the skin 3 (three) times daily with meals.) 45 mL 0   Insulin Pen Needle 31G X 5 MM MISC Use one pen needle three times daily. Dx  E11.49 100 each 3   lisinopril (ZESTRIL) 10 MG tablet Take 1 tablet (10 mg total) by mouth daily. 180 tablet 3   metFORMIN (GLUCOPHAGE) 500 MG tablet Take 1 tablet (500 mg total) by mouth 2 (two) times daily with a meal. 90 tablet 3   REPATHA SURECLICK 353 MG/ML SOAJ INJECT 1 PEN INTO THE SKIN EVERY 14 (FOURTEEN) DAYS. (Patient taking differently: Inject 1 pen into the skin every 14 (fourteen) days.) 2 mL 11   SOLIQUA 100-33 UNT-MCG/ML SOPN Inject 40 Units into the skin daily. 15 mL 3   SOLIQUA 100-33 UNT-MCG/ML SOPN INJECT 40 UNITS SUBCUTANEOUSLY ONCE DAILY 15 mL 0   sulfamethoxazole-trimethoprim (BACTRIM DS) 800-160 MG tablet Take 1 tablet by mouth 2 (two) times daily. 14 tablet 0   valACYclovir (VALTREX) 1000 MG tablet Take 1 tablet (1,000 mg total) by mouth 2 (two) times daily. 20 tablet 0   vitamin B-12 (CYANOCOBALAMIN) 100 MCG tablet Take 100 mcg by mouth daily.     Allergies: Allergies as of 09/21/2021 - Review Complete 03/15/2021  Allergen Reaction Noted   Morpholine salicylate Other (See Comments) 10/16/2019   Penicillins Other (See Comments) 10/16/2019   Morphine and related Other (See Comments) 04/18/2016   Sglt2 inhibitors Rash and Other (See Comments) 04/12/2018   Statins Rash and Other (See Comments) 04/18/2016   Past Medical History:  Diagnosis Date   Acute diastolic congestive heart failure (HCC)    AKI (acute kidney injury) ([place])    Aortic valve stenosis s/p AVR 2018   Echo 2/22: Poor acoustic windows, EF 50-55, mild LVH, normal RVSF, mild MR, trivial AI, no AS, no pericardial effusion   Atrial fibrillation (HCC) - post-op CABG    04/2016 CHA2DS2VAS score = 5   Atypical nevi    Coronary artery disease s/p 2 vessel CABG    Depression    "years ago"   Diabetes mellitus    Dyspnea    in the past    GERD (gastroesophageal reflux disease)    Heart murmur    Hepatic cirrhosis ([place]) 06/29/2017   History of kidney stones    Hyperlipidemia    hx of transaminitis secondary  to statin and he has decided not to use statins secondary to potential side effects.   Hypertension    Nephrolithiasis    Osteoarthritis, knee    Pericardial effusion a. subxiphoid pericardial window on 04/21/2016 04/28/2016   Peripheral neuropathic pain 05/12/2016   Sleep apnea     Central apnea. 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 MRI of the brain 2007 showed acute left lateral medullary infarct in the distribution of left  posterior inferior cerebral artery , narrowing of the left vertebral with severe diminution of flow or acute occlusion. 2-D echo was normal no embolic source found.   Family History:  Mother- deceased, DM and HTN Father- deceased, Lung Cancer Sister in good health Brother in good health One child, daughter, in good health  Social History: Pt lives with a male roommate. He reports she is also in poor health and they both are trying to help take care of each other. She is currently hospitalized due to a stroke. Pt reports eating mostly canned food or whatever he can find in the fridge in which he sometimes forgets to eat. He is unable to exercise and can only walk a short distance at a time. He is unable to drive. He is a retired Training and development officer. He denies any alcohol, drug, or tobacco use.   Review of Systems: A complete ROS was negative except as per HPI.   Physical Exam: Vitals:   09/21/21 1822  BP: 103/61  Pulse: 66  Resp: 16  Temp: 97.8 F (36.6 C)  SpO2: 100%   General: Resting comfortably in chair, no acute distress HENT: Normocephalic, atraumatic. Dry mucous membranes.     Ears: TMs normal CV: Regular rate, rhythm. No murmurs appreciated. Warm extremities.  Pulm: Normal work of breathing on room air. Clear to ausculation bilaterally, with decreased lung sounds in lower bases.  No wheezing noted on exam GI: Abdomen  soft, non-tender, non-distended. Normoactive bowel sounds.  No fluid wave appreciated GU: Erythema around glans of the penis. No discharge or tenderness to palpation.  MSK: Normal bulk, tone.  Unable to stand without assistance .1+ pitting edema bilateral lower extremities.  2+ peripheral pulses in lower extremities Skin: Warm, dry. Neuro: Awake, alert, grossly non-focal. Oriented to person, time, and event. Not oriented to place.  Assessment & Plan by Problem: Active Problems: Principal Problem:   Hypotension Active Problems:   Physical deconditioning  KEY CEN is a 71 yo male with the PMH of DM, HLD, HTN, diastolic HF, CAD s/p CABG, A-fib, sleep apnea, compensated cirrhosis (by ultrasound on 2021), and CVA admitted for worsening hypotension and uncontrolled diabetes in the setting of worsening cognitive impairment   Hypotension Presented today with hypotension, 80/50. He does report that he is lightheaded, but cannot recall for how long. Per his daughter, he did have a fall last week, unclear on mechanism. He is unsure how much he has been eating or drinking, as often will forget.  He is often missing or double dosing medications without noticing.  Likely experiencing hypovolemic hypotension in the setting of poor p.o. intake, occasional mismanagement, and deconditioning due to progressive memory impairment. His HR is normal, sating well on room air, afebrile. He does not look infectious, but rather dry.  Patient received bolus of fluids in clinic with improved repeat BP.   - IV fluid resuscitation - CBC, CMP, lactic pending - Monitoring BP - Falls risk with PT assessment - Orthostatic vitals - Holding antihypertensives   Diabetes mellitus with neurological manifestation (HCC) Patient's A1c >14.0% today w/ CBG 450. He is not reporting any signs or symptoms of DKA. Of note, patient's daughter reports he has trouble reading the insulin pen. Will need to find best solution for him to  take insulin safely. UA showing ketones. - IV fluid resuscitation - 15 Semglee with moderate SSI with meal time correction - CBG monitoring - Holding home Metformin - Lactic acid, in process -CMP, impressive  Diastolic Heart Failure  Pt with history of diastolic HF. EF 50-55%. Pt appears to be mildly fluid overloaded with 1+ pitting edema.  - ECHO - CXR - strict ins/outs - Telemetry  Atrial Fibrillation Pt with history of A-fib, anticoagulated on Eliquis. Currently in regular rhythm.  Unclear if patient has been compliant with medication at home -Telemetry  -Continue Eliquis  Mild cognitive impairment Pt presented today after appointment with his daughter, [name]. She reports he has not been doing well at home, including medication non-compliance, multiple falls, lack of cleanliness, lack of food/water. Concern pt has progressed to dementia with his symptoms possible vascular dementia given multiple stokes and medication mismanagement. He has been checked for reversible causes in the past, including B12 and TSH, both of which were normal. [name] is seeking out to become patient's guardian. Will discuss with TOC/social work. UA negative. - Vitamin B12 and TSH, pending - MOCA in the morning  - PT/OT - Delirium Precautions  Cirrhosis  Pt with prior RUQ [place] in 2021 that showed cirrhotic hepatic morphology without evidence of focal lesion.  Patient also with a history of leukopenia and low platelets.  No fluid wave on exam.  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  Patient was previously treated with Valtrex. -Continue Valtrex   Deconditioning SNF placement Pt's daughter requesting help with SNF placement reporting pt is unable to support IADLs or ADLs. [name] is seeking out to become patient's guardian. Will discuss with TOC/social w - PT/OT - Consult TOC and case management    Best Practice Diet: HH VTE: Eliquis Bowel:  Senna Code: Full  Dispo: Admit patient to Inpatient with expected length of stay greater than 2 midnights.  Signed: [name], Medical Student 09/21/2021, 6:06 PM  Pager: [phone]  Attestation for Student Documentation:  I personally was present and performed or re-performed the history, physical exam and medical decision-making activities of this service and have verified that the service and findings are accurately documented in the student's note.  Romana Juniper, MD 09/21/2021, 6:55 PM

## 2021-09-21 NOTE — Hospital Course (Addendum)
   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

## 2021-09-21 NOTE — Assessment & Plan Note (Signed)
[name] is presenting today after appointment request from his daughter, Mark Cabrera. She reports he has not been doing well at home, including medication non-compliance, multiple falls, lack of cleanliness, lack of food/water. Mark Cabrera mentioned to me that [name] has been admitted into a skilled nursing facility three separate times, but he has left each time. His memory issues have significantly worsened, as she reports on multiple occassions he has taken insulin multiple times within an hour because he forgot that he already took it. He will also call his daughter many times throughout the day, often forgetting their previous conversation. Mentions large mood swings, often lashing out at others but quickly will apologize a few minutes later. She mentions she called APS yesterday due to poor living conditions, but patient refused to answer the door for the social worker. She does not believe that he has bathed himself in a few weeks.   I am concerned that [name] has progressed to dementia with his symptoms. I have checked reversible causes in the past, including B12 and TSH, both of which were normal. Mark Cabrera is seeking out to become patient's guardian. I explained process of competency and that it is a legal proceeding. I suggested Mark Cabrera discuss this further with the case manager and social worker that will be working with [name] during his upcoming hospitalization.   - Follow-up with inpatient social work - Discuss best placement options

## 2021-09-21 NOTE — Assessment & Plan Note (Addendum)
Mark Cabrera is presenting today with hypotension, 70/40. He does report that he is lightheaded, but cannot recall for how long. Per his daughter, he did have a fall last week, unclear on mechanism. He is unsure how much he has been eating or drinking, as often will forget. Per his daughter, he calls her many times daily and often will forget that he had already talked to her. He denies any recent GI illnesses or sick contacts. He denies any fevers, cough, dyspnea, nausea, vomiting, abdominal pain, dysuria.   It appears Mark Cabrera is experiencing hypovolemic hypotension, likely from deconditioning due to progressive memory impairment. His HR is normal, sating well on room air, afebrile. He does not look infectious, but rather dry. CBG 450, no signs or symptoms of DKA currently. We have started IV bolus for fluid resuscitation and have called for direct admission to IMTS.   - 1L LR bolus - CBC w/ diff, CMP, lactic pending - Direct admission pending

## 2021-09-21 NOTE — Progress Notes (Unsigned)
Patient ID: Mark Cabrera, male   DOB: September 01, 1950, 71 y.o.   MRN: [id number]   CC: follow-up  HPI:  Mark Cabrera is a 71 y.o. person with uncontrolled type II diabetes, previous CVA, chronic diastolic heart failure, CAD s/p CABG, paroxysmal atrial fibrillation on Eliquis, compensated cirrhosis, mild cognitive impairment presenting to [facility] for regular follow-up.  Please see problem-based list for further details, assessments, and plans.  Past Medical History:  Diagnosis Date   Acute diastolic congestive heart failure (HCC)    AKI (acute Cabrera injury) ([place])    Aortic valve stenosis s/p AVR 2018   Echo 2/22: Poor acoustic windows, EF 50-55, mild LVH, normal RVSF, mild MR, trivial AI, no AS, no pericardial effusion   Atrial fibrillation (HCC) - post-op CABG    04/2016 CHA2DS2VAS score = 5   Atypical nevi    Coronary artery disease s/p 2 vessel CABG    Depression    "years ago"   Diabetes mellitus    Dyspnea    in the past    GERD (gastroesophageal reflux disease)    Heart murmur    Hepatic cirrhosis ([place]) 06/29/2017   History of Cabrera stones    Hyperlipidemia    hx of transaminitis secondary to statin and he has decided not to use statins secondary to potential side effects.   Hypertension    Nephrolithiasis    Osteoarthritis, knee    Pericardial effusion a. subxiphoid pericardial window on 04/21/2016 04/28/2016   Peripheral neuropathic pain 05/12/2016   Sleep apnea     Central apnea. 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 2-D echo was normal no embolic source found.   Review of Systems:  As per HPI  Physical Exam:  Vitals:    09/21/21 1458 09/21/21 1459  BP: (!) 77/43 (!) 80/50  Pulse: 80 83  Temp: 97.8 F (36.6 C)   TempSrc: Oral   SpO2: 99%   Weight: 174 lb (78.9 kg)   Height: '5\' 8"'$  (1.727 m)    General: Resting comfortably in chair, no acute distress HENT: Normocephalic, atraumatic. Dry mucous membranes. CV: Regular rate, rhythm. No murmurs appreciated. Warm extremities.  Pulm: Normal work of breathing on room air. Clear to ausculation bilaterally.  GI: Abdomen soft, non-tender, non-distended. Normoactive bowel sounds.  MSK: Normal bulk, tone. 1+ pitting edema bilateral lower extremities.  Skin: Warm, dry. No rashes or lesions.  Neuro: Awake, alert, grossly non-focal  Assessment & Plan:   Hypotension Mark Cabrera is presenting today with hypotension, 70/40. He does report that he is lightheaded, but cannot recall for how long. Per his daughter, he did have a fall last week, unclear on mechanism. He is unsure how much he has been eating or drinking, as often will forget. Per his daughter, he calls her many times daily and often will forget that he had already talked to her. He denies any recent GI illnesses or sick contacts. He denies any fevers, cough, dyspnea, nausea, vomiting, abdominal pain, dysuria.   It appears Mark Cabrera is experiencing hypovolemic hypotension, likely from deconditioning due to progressive memory impairment. His HR is normal, sating well on  room air, afebrile. He does not look infectious, but rather dry. CBG 450, no signs or symptoms of DKA currently. We have started IV bolus for fluid resuscitation and have called for direct admission to IMTS.   - 1L LR bolus - CBC w/ diff, CMP, lactic pending - Direct admission pending  Mild cognitive impairment Mr. Spalla is presenting today after appointment request from his daughter, Mark Cabrera. She reports he has not been doing well at home, including medication non-compliance, multiple falls, lack of cleanliness, lack of food/water. Mark Cabrera mentioned  to me that Mark Cabrera has been admitted into a skilled nursing facility three separate times, but he has left each time. His memory issues have significantly worsened, as she reports on multiple occassions he has taken insulin multiple times within an hour because he forgot that he already took it. He will also call his daughter many times throughout the day, often forgetting their previous conversation. Mentions large mood swings, often lashing out at others but quickly will apologize a few minutes later. She mentions she called APS yesterday due to poor living conditions, but patient refused to answer the door for the social worker. She does not believe that he has bathed himself in a few weeks.   I am concerned that Mark Cabrera has progressed to dementia with his symptoms. I have checked reversible causes in the past, including B12 and TSH, both of which were normal. Mark Cabrera is seeking out to become patient's guardian. I explained process of competency and that it is a legal proceeding. I suggested Mark Cabrera discuss this further with the case manager and social worker that will be working with Mark Cabrera during his upcoming hospitalization.   - Follow-up with inpatient social work - Discuss best placement options    Diabetes mellitus with neurological manifestation ([place]) Patient's A1c >14.0% today w/ CBG 450. He is not reporting any signs or symptoms of DKA. With his significant hypotension he will be admitted to the hospital. We will follow-up after discharge for titration of his diabetic regimen.  *Of note, patient's daughter reports he has trouble reading the insulin pen. Will need to find best solution for him to take insulin safely.    Patient discussed with Dr. Jacquenette Shone, MD Internal Medicine PGY-3 Pager: [phone]     Report called to [place] on 3E.  Patient transferred via wheelchair to [place].  Alert oriented .   Sander Nephew, RN 09/21/2021 5:53 PM

## 2021-09-21 NOTE — Assessment & Plan Note (Signed)
Patient's A1c >14.0% today w/ CBG 450. He is not reporting any signs or symptoms of DKA. With his significant hypotension he will be admitted to the hospital. We will follow-up after discharge for titration of his diabetic regimen.  *Of note, patient's daughter reports he has trouble reading the insulin pen. Will need to find best solution for him to take insulin safely.

## 2021-09-22 ENCOUNTER — Encounter (HOSPITAL_COMMUNITY): Payer: Self-pay | Admitting: Internal Medicine

## 2021-09-22 ENCOUNTER — Observation Stay (HOSPITAL_COMMUNITY): Payer: Medicare Other

## 2021-09-22 DIAGNOSIS — D709 Neutropenia, unspecified: Secondary | ICD-10-CM | POA: Diagnosis not present

## 2021-09-22 DIAGNOSIS — R0602 Shortness of breath: Secondary | ICD-10-CM | POA: Diagnosis not present

## 2021-09-22 DIAGNOSIS — Z6827 Body mass index (BMI) 27.0-27.9, adult: Secondary | ICD-10-CM | POA: Diagnosis not present

## 2021-09-22 DIAGNOSIS — E785 Hyperlipidemia, unspecified: Secondary | ICD-10-CM | POA: Diagnosis present

## 2021-09-22 DIAGNOSIS — Z794 Long term (current) use of insulin: Secondary | ICD-10-CM | POA: Diagnosis not present

## 2021-09-22 DIAGNOSIS — R0902 Hypoxemia: Secondary | ICD-10-CM | POA: Diagnosis not present

## 2021-09-22 DIAGNOSIS — G3184 Mild cognitive impairment, so stated: Secondary | ICD-10-CM | POA: Diagnosis not present

## 2021-09-22 DIAGNOSIS — I9589 Other hypotension: Secondary | ICD-10-CM | POA: Diagnosis not present

## 2021-09-22 DIAGNOSIS — Z951 Presence of aortocoronary bypass graft: Secondary | ICD-10-CM | POA: Diagnosis not present

## 2021-09-22 DIAGNOSIS — N3289 Other specified disorders of bladder: Secondary | ICD-10-CM | POA: Diagnosis not present

## 2021-09-22 DIAGNOSIS — R509 Fever, unspecified: Secondary | ICD-10-CM | POA: Diagnosis not present

## 2021-09-22 DIAGNOSIS — I959 Hypotension, unspecified: Secondary | ICD-10-CM | POA: Diagnosis present

## 2021-09-22 DIAGNOSIS — E861 Hypovolemia: Secondary | ICD-10-CM | POA: Diagnosis present

## 2021-09-22 DIAGNOSIS — I480 Paroxysmal atrial fibrillation: Secondary | ICD-10-CM | POA: Diagnosis present

## 2021-09-22 DIAGNOSIS — K449 Diaphragmatic hernia without obstruction or gangrene: Secondary | ICD-10-CM | POA: Diagnosis not present

## 2021-09-22 DIAGNOSIS — Z953 Presence of xenogenic heart valve: Secondary | ICD-10-CM | POA: Diagnosis not present

## 2021-09-22 DIAGNOSIS — D61818 Other pancytopenia: Secondary | ICD-10-CM | POA: Diagnosis present

## 2021-09-22 DIAGNOSIS — M6259 Muscle wasting and atrophy, not elsewhere classified, multiple sites: Secondary | ICD-10-CM | POA: Diagnosis not present

## 2021-09-22 DIAGNOSIS — K7460 Unspecified cirrhosis of liver: Secondary | ICD-10-CM | POA: Diagnosis present

## 2021-09-22 DIAGNOSIS — I951 Orthostatic hypotension: Secondary | ICD-10-CM | POA: Diagnosis present

## 2021-09-22 DIAGNOSIS — F0154 Vascular dementia, unspecified severity, with anxiety: Secondary | ICD-10-CM | POA: Diagnosis present

## 2021-09-22 DIAGNOSIS — E1149 Type 2 diabetes mellitus with other diabetic neurological complication: Secondary | ICD-10-CM | POA: Diagnosis present

## 2021-09-22 DIAGNOSIS — Z885 Allergy status to narcotic agent status: Secondary | ICD-10-CM | POA: Diagnosis not present

## 2021-09-22 DIAGNOSIS — Z8673 Personal history of transient ischemic attack (TIA), and cerebral infarction without residual deficits: Secondary | ICD-10-CM | POA: Diagnosis not present

## 2021-09-22 DIAGNOSIS — K5790 Diverticulosis of intestine, part unspecified, without perforation or abscess without bleeding: Secondary | ICD-10-CM | POA: Diagnosis not present

## 2021-09-22 DIAGNOSIS — R2681 Unsteadiness on feet: Secondary | ICD-10-CM | POA: Diagnosis not present

## 2021-09-22 DIAGNOSIS — I5032 Chronic diastolic (congestive) heart failure: Secondary | ICD-10-CM

## 2021-09-22 DIAGNOSIS — E860 Dehydration: Secondary | ICD-10-CM | POA: Diagnosis present

## 2021-09-22 DIAGNOSIS — Z888 Allergy status to other drugs, medicaments and biological substances status: Secondary | ICD-10-CM | POA: Diagnosis not present

## 2021-09-22 DIAGNOSIS — E1140 Type 2 diabetes mellitus with diabetic neuropathy, unspecified: Secondary | ICD-10-CM | POA: Diagnosis not present

## 2021-09-22 DIAGNOSIS — I2510 Atherosclerotic heart disease of native coronary artery without angina pectoris: Secondary | ICD-10-CM | POA: Diagnosis present

## 2021-09-22 DIAGNOSIS — M6281 Muscle weakness (generalized): Secondary | ICD-10-CM | POA: Diagnosis not present

## 2021-09-22 DIAGNOSIS — R1312 Dysphagia, oropharyngeal phase: Secondary | ICD-10-CM | POA: Diagnosis not present

## 2021-09-22 DIAGNOSIS — B999 Unspecified infectious disease: Secondary | ICD-10-CM | POA: Diagnosis not present

## 2021-09-22 DIAGNOSIS — E1165 Type 2 diabetes mellitus with hyperglycemia: Secondary | ICD-10-CM | POA: Diagnosis present

## 2021-09-22 DIAGNOSIS — K7682 Hepatic encephalopathy: Secondary | ICD-10-CM | POA: Diagnosis present

## 2021-09-22 DIAGNOSIS — I110 Hypertensive heart disease with heart failure: Secondary | ICD-10-CM | POA: Diagnosis present

## 2021-09-22 DIAGNOSIS — Z880 Allergy status to penicillin: Secondary | ICD-10-CM | POA: Diagnosis not present

## 2021-09-22 DIAGNOSIS — E440 Moderate protein-calorie malnutrition: Secondary | ICD-10-CM | POA: Diagnosis present

## 2021-09-22 DIAGNOSIS — R278 Other lack of coordination: Secondary | ICD-10-CM | POA: Diagnosis not present

## 2021-09-22 DIAGNOSIS — R41841 Cognitive communication deficit: Secondary | ICD-10-CM | POA: Diagnosis not present

## 2021-09-22 DIAGNOSIS — R4189 Other symptoms and signs involving cognitive functions and awareness: Secondary | ICD-10-CM | POA: Diagnosis present

## 2021-09-22 DIAGNOSIS — F01511 Vascular dementia, unspecified severity, with agitation: Secondary | ICD-10-CM | POA: Diagnosis present

## 2021-09-22 LAB — ECHOCARDIOGRAM COMPLETE
AR max vel: 1.23 cm2
AV Area VTI: 1.26 cm2
AV Area mean vel: 1.44 cm2
AV Mean grad: 24 mmHg
AV Peak grad: 40.2 mmHg
Ao pk vel: 3.17 m/s
Area-P 1/2: 3.95 cm2
Calc EF: 48.8 %
Height: 68 in
S' Lateral: 4.1 cm
Single Plane A2C EF: 34.8 %
Single Plane A4C EF: 58.7 %
Weight: 3014.4 oz

## 2021-09-22 LAB — CBC
HCT: 27.4 % — ABNORMAL LOW (ref 39.0–52.0)
Hemoglobin: 9.7 g/dL — ABNORMAL LOW (ref 13.0–17.0)
MCH: 31.3 pg (ref 26.0–34.0)
MCHC: 35.4 g/dL (ref 30.0–36.0)
MCV: 88.4 fL (ref 80.0–100.0)
Platelets: 155 10*3/uL (ref 150–400)
RBC: 3.1 MIL/uL — ABNORMAL LOW (ref 4.22–5.81)
RDW: 13.2 % (ref 11.5–15.5)
WBC: 2.4 10*3/uL — ABNORMAL LOW (ref 4.0–10.5)
nRBC: 0 % (ref 0.0–0.2)

## 2021-09-22 LAB — GLUCOSE, CAPILLARY
Glucose-Capillary: 172 mg/dL — ABNORMAL HIGH (ref 70–99)
Glucose-Capillary: 220 mg/dL — ABNORMAL HIGH (ref 70–99)
Glucose-Capillary: 296 mg/dL — ABNORMAL HIGH (ref 70–99)

## 2021-09-22 LAB — BASIC METABOLIC PANEL
Anion gap: 10 (ref 5–15)
Anion gap: 6 (ref 5–15)
BUN: 14 mg/dL (ref 8–23)
BUN: 12 mg/dL (ref 8–23)
CO2: 24 mmol/L (ref 22–32)
CO2: 26 mmol/L (ref 22–32)
Calcium: 9.2 mg/dL (ref 8.9–10.3)
Calcium: 9.2 mg/dL (ref 8.9–10.3)
Chloride: 103 mmol/L (ref 98–111)
Chloride: 104 mmol/L (ref 98–111)
Creatinine, Ser: 0.91 mg/dL (ref 0.61–1.24)
Creatinine, Ser: 0.98 mg/dL (ref 0.61–1.24)
GFR, Estimated: >60 mL/min (ref >60)
GFR, Estimated: >60 mL/min (ref >60)
Glucose, Bld: 160 mg/dL — ABNORMAL HIGH (ref 70–99)
Glucose, Bld: 248 mg/dL — ABNORMAL HIGH (ref 70–99)
Potassium: 3 mmol/L — ABNORMAL LOW (ref 3.5–5.1)
Potassium: 3.5 mmol/L (ref 3.5–5.1)
Sodium: 137 mmol/L (ref 135–145)
Sodium: 136 mmol/L (ref 135–145)

## 2021-09-22 LAB — TSH: TSH: 0.925 u[IU]/mL (ref 0.350–4.500)

## 2021-09-22 LAB — LACTIC ACID, PLASMA
Lactic Acid, Venous: 2.7 mmol/L (ref 0.5–1.9)
Lactic Acid, Venous: 2.2 mmol/L (ref 0.5–1.9)

## 2021-09-22 LAB — VITAMIN B12: Vitamin B-12: 460 pg/mL (ref 180–914)

## 2021-09-22 MED ORDER — LACTATED RINGERS IV BOLUS
250.0000 mL | Freq: Once | INTRAVENOUS | Status: AC
  Administered 2021-09-22: 250 mL via INTRAVENOUS

## 2021-09-22 MED ORDER — GLUCERNA SHAKE PO LIQD
237.0000 mL | Freq: Three times a day (TID) | ORAL | Status: DC
  Administered 2021-09-23 – 2021-09-28 (×8): 237 mL via ORAL
  Filled 2021-09-22 (×3): qty 237

## 2021-09-22 MED ORDER — LIVING WELL WITH DIABETES BOOK
Freq: Once | Status: AC
Start: 2021-09-22 — End: 2021-09-22
  Filled 2021-09-22: qty 1

## 2021-09-22 MED ORDER — POTASSIUM CHLORIDE 10 MEQ/100ML IV SOLN
INTRAVENOUS | Status: AC
  Filled 2021-09-22: qty 100

## 2021-09-22 MED ORDER — LACTATED RINGERS IV BOLUS
500.0000 mL | Freq: Once | INTRAVENOUS | Status: AC
Start: 2021-09-22 — End: 2021-09-22
  Administered 2021-09-22: 500 mL via INTRAVENOUS

## 2021-09-22 MED ORDER — VALACYCLOVIR HCL 500 MG PO TABS
1000.0000 mg | ORAL_TABLET | Freq: Two times a day (BID) | ORAL | Status: DC
  Administered 2021-09-22 – 2021-09-28 (×13): 1000 mg via ORAL
  Filled 2021-09-22 (×14): qty 2

## 2021-09-22 MED ORDER — POTASSIUM CHLORIDE 10 MEQ/100ML IV SOLN
10.0000 meq | INTRAVENOUS | Status: AC
  Administered 2021-09-22 (×2): 10 meq via INTRAVENOUS
  Filled 2021-09-22: qty 100

## 2021-09-22 MED ORDER — ENSURE ENLIVE PO LIQD
237.0000 mL | Freq: Two times a day (BID) | ORAL | Status: DC
Start: 2021-09-22 — End: 2021-09-22
  Administered 2021-09-22: 237 mL via ORAL

## 2021-09-22 NOTE — Inpatient Diabetes Management (Addendum)
Inpatient Diabetes Program Recommendations  AACE/ADA: New Consensus Statement on Inpatient Glycemic Control (2015)  Target Ranges:  Prepandial:   less than 140 mg/dL      Peak postprandial:   less than 180 mg/dL (1-2 hours)      Critically ill patients:  140 - 180 mg/dL   Lab Results  Component Value Date   GLUCAP 220 (H) 09/22/2021   HGBA1C >14.0 09/21/2021    Review of Glycemic Control  Latest Reference Range & Units 09/21/21 14:58 09/21/21 19:32 09/21/21 20:27 09/21/21 21:35 09/22/21 05:41 09/22/21 11:20  Glucose-Capillary 70 - 99 mg/dL 450 (H) 255 (H) 299 (H) 350 (H) 172 (H) 220 (H)  (H): Data is abnormally high  Diabetes history: DM2 Outpatient Diabetes medications: Soliqua 45 units q hs, Novolog 18 units tid, Metformin 500 mg bid Current orders for Inpatient glycemic control: Semglee 15 units qd, Novolog 0-15 units tid + 0-5 units hs correction  Inpatient Diabetes Program Recommendations:   Please consider: -Add Novolog 5 units tid meal coverage if eats 50%  Spoke with pt about A1C results 14 (average blood glucose 355 over the past 2-3 months) and explained what an A1C is, basic pathophysiology of DM Type 2, basic home care, basic diabetes diet nutrition principles, importance of checking CBGs and maintaining good CBG control to prevent long-term and short-term complications. Reviewed signs and symptoms of hyperglycemia and hypoglycemia and how to treat hypoglycemia at home. Also reviewed blood sugar goals at home.  RNs to provide ongoing basic DM education at bedside with this patient. Have ordered educational booklet Living Well With Diabetes.  Patient states he recently had a urinary tract infection and has been eating increased amounts of carbohydrates. Patient states he is sleeping and just woke up so limited talking during this visit.  Thank you, Nani Gasser. Samad Thon, RN, MSN, CDE  Diabetes Coordinator Inpatient Glycemic Control Team Team Pager [phone]  (8am-5pm) 09/22/2021 3:22 PM

## 2021-09-22 NOTE — Progress Notes (Signed)
Initial Nutrition Assessment  DOCUMENTATION CODES:   Non-severe (moderate) malnutrition in context of social or environmental circumstances  INTERVENTION:   Change Ensure to Glucerna Shake TID  Encourage PO intake   NUTRITION DIAGNOSIS:   Moderate Malnutrition related to social / environmental circumstances as evidenced by moderate muscle depletion, moderate fat depletion. Ongoing.   GOAL:   Patient will meet greater than or equal to 90% of their needs Progressing.   MONITOR:   PO intake, Supplement acceptance  REASON FOR ASSESSMENT:   Malnutrition Screening Tool    ASSESSMENT:   Pt with PMH of DM, HLD, HTN, diastolic HF, CAD s/p CABG, A-fib, sleep apnea, compensated cirrhosis, CVA admitted with hypotension, uncontrolled DM, and worsening cognitive impairment.   Spoke with pt who reports that he has been renting a room from an elderly lady for the last 1 year. He has access to the kitchen but does not cook. He goes out to eat about 1 time per month. He does not eat 3 meals per day but instead just eats when he gets hungry. A meal may consist of a can of lima beans eaten out of a can, he also eats eggs, peanut butter sandwiches and lunch meat sandwiches. The lady he lives with does not eat meat, his sources of protein are peanut butter, eggs, lunch meat sometimes, and milk. He drinks milk and Dr Malachi Bonds throughout the day.  He walks with a walking stick but has fallen some recently.  He reports at least a 20 lb weight loss over the last 1-2 years. Chart confirms a 7% wight loss x 9 months and a 14% weight loss x 16 months. His usual weight was 200 lb and he is currently 188 lb.   During visit ex-wife visited and reports he has been able to do less for himself and hopes for ALF admission for pt at discharge. Social work following.  DM coordinator following for insulin recommendations.   Medications reviewed and include: ensure, folic acid, SSI, semglee 15 units daily, MVI with  minerals, thiamine  Labs reviewed:  Vitamin B 12: 460 A1C: >14 CBG's: 172-350  NUTRITION - FOCUSED PHYSICAL EXAM:  Flowsheet Row Most Recent Value  Orbital Region Moderate depletion  Upper Arm Region Moderate depletion  Thoracic and Lumbar Region Moderate depletion  Buccal Region Moderate depletion  Temple Region Moderate depletion  Clavicle Bone Region Mild depletion  Clavicle and Acromion Bone Region Mild depletion  Scapular Bone Region Mild depletion  Dorsal Hand Mild depletion  Patellar Region Moderate depletion  Anterior Thigh Region Moderate depletion  Posterior Calf Region Moderate depletion  Edema (RD Assessment) None  Hair Reviewed  Eyes Reviewed  Mouth Reviewed  [missing top teeth]  Skin Reviewed  Nails Reviewed       Diet Order:   Diet Order             Diet Carb Modified Fluid consistency: Thin; Room service appropriate? Yes  Diet effective now                   EDUCATION NEEDS:   Education needs have been addressed  Skin:  Skin Assessment: Reviewed RN Assessment  Last BM:  unknown  Height:   Ht Readings from Last 1 Encounters:  09/22/21 '5\' 8"'$  (1.727 m)    Weight:   Wt Readings from Last 1 Encounters:  09/22/21 85.5 kg    BMI:  Body mass index is 28.65 kg/m.  Estimated Nutritional Needs:   Kcal:  1900-2100  Protein:  90-100 grams  Fluid:  >1.9 L/day  Lockie Pares., RD, LDN, CNSC See AMiON for contact information

## 2021-09-22 NOTE — Progress Notes (Signed)
       RE:  Mark Cabrera      Date of Birth:  1950-05-16    Date:   09/22/2021      To Whom It May Concern:  Please be advised that the above-named patient will require a short-term nursing home stay - anticipated 30 days or less for rehabilitation and strengthening.  The plan is for return home.

## 2021-09-22 NOTE — NC FL2 (Addendum)
[place] LEVEL OF CARE SCREENING TOOL     IDENTIFICATION  Patient Name: Mark Cabrera Birthdate: [birth year] Sex: male Admission Date (Current Location): 09/21/2021  [facility] and [place] Number:  Herbalist and Address:  [facility] [place]. [facility], [place] [address] [place], [place] [address]      Provider Number: [id number]  Attending Physician Name and Address:  Charise Killian, MD  Relative Name and Phone Number:  Mark, Cabrera (Daughter)   [phone] [facility])    Current Level of Care: Hospital Recommended Level of Care: [place] Prior Approval Number:    Date Approved/Denied:   PASRR Number: [id number]  Discharge Plan: SNF    Current Diagnoses: Patient Active Problem List   Diagnosis Date Noted   Symptomatic hypotension 09/22/2021   Physical deconditioning 09/21/2021   Otitis externa 12/31/2020   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   Balanitis 72/11/4707   Complicated urinary tract infection    Thrombocytopenia (HCC)    Hyponatremia 11/08/2020   Left shoulder pain 10/20/2020   Dyspnea 09/23/2020   Mild cognitive impairment 08/25/2020   Hip pain, chronic, left 07/05/2020   Epididymitis 04/14/2020   Fatigue 03/18/2020   Hypotension 03/10/2020   Abdominal wall bulge 12/03/2019   Dermoid inclusion cyst 08/07/2019   Chronic left shoulder pain 05/13/2019   Depression 11/05/2018   Blurry vision, bilateral 09/20/2018   CVA (cerebral vascular accident) ([place]) 06/28/2018   Hepatic cirrhosis ([place]) 06/29/2017   Healthcare maintenance 12/16/2016   Lipoma of left upper extremity 09/12/2016   Chronic knee pain 07/04/2016   Decreased visual acuity 07/04/2016   Pleural effusion on left, s/p throacentesis 04/18/16 04/28/2016   Paroxysmal atrial fibrillation ([place]) 04/16/2016   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/31/2016   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   CAD (coronary artery disease), native coronary artery    Chronic diastolic  heart failure (HCC)    Sleep apnea 08/02/2009   Diabetes mellitus with neurological manifestation ([place]) 10/18/2007   Dyslipidemia 05/18/2006   Hypertensive heart disease     Orientation RESPIRATION BLADDER Height & Weight     Self, Situation, Place  O2 (2Lo2) Continent Weight: 188 lb 6.4 oz (85.5 kg) Height:  '5\' 8"'$  (172.7 cm)  BEHAVIORAL SYMPTOMS/MOOD NEUROLOGICAL BOWEL NUTRITION STATUS      Continent Diet (See d/c summary)  AMBULATORY STATUS COMMUNICATION OF NEEDS Skin   Limited Assist Verbally Normal                       Personal Care Assistance Level of Assistance  Bathing, Feeding, Dressing Bathing Assistance: Limited assistance Feeding assistance: Independent Dressing Assistance: Limited assistance     Functional Limitations Info  Sight, Hearing, Speech Sight Info: Impaired Hearing Info: Adequate Speech Info: Adequate    SPECIAL CARE FACTORS FREQUENCY  PT (By licensed PT), OT (By licensed OT)     PT Frequency: 5x/week OT Frequency: 5x/week            Contractures Contractures Info: Not present    Additional Factors Info  Code Status, Allergies Code Status Info: DNR Allergies Info: Morpholine Salicylate, penecillins, morphine and related, Sglt2 Inhibitors, statins           Current Medications (09/22/2021):  This is the current hospital active medication list Current Facility-Administered Medications  Medication Dose Route Frequency Provider Last Rate Last Admin   acetaminophen (TYLENOL) tablet 650 mg  650 mg Oral Q6H PRN Riesa Pope, MD  Or   acetaminophen (TYLENOL) suppository 650 mg  650 mg Rectal Q6H PRN Katsadouros, Vasilios, MD       apixaban (ELIQUIS) tablet 5 mg  5 mg Oral BID Virl Axe, MD   5 mg at 09/22/21 0901   feeding supplement (ENSURE ENLIVE / ENSURE PLUS) liquid 237 mL  237 mL Oral BID BM Charise Killian, MD   237 mL at 46/56/81 2751   folic acid (FOLVITE) tablet 1 mg  1 mg Oral Daily Virl Axe, MD   1 mg at  09/22/21 0901   insulin aspart (novoLOG) injection 0-15 Units  0-15 Units Subcutaneous TID WC Virl Axe, MD   5 Units at 09/22/21 1133   insulin aspart (novoLOG) injection 0-5 Units  0-5 Units Subcutaneous QHS Virl Axe, MD   4 Units at 09/21/21 2159   insulin glargine-yfgn (SEMGLEE) injection 15 Units  15 Units Subcutaneous QHS Virl Axe, MD   15 Units at 09/21/21 2157   multivitamin with minerals tablet 1 tablet  1 tablet Oral Daily Virl Axe, MD   1 tablet at 09/22/21 0901   potassium chloride 10 MEQ/100ML IVPB            senna-docusate (Senokot-S) tablet 1 tablet  1 tablet Oral QHS PRN Katsadouros, Vasilios, MD       thiamine tablet 100 mg  100 mg Oral Daily Virl Axe, MD   100 mg at 09/22/21 0901   valACYclovir (VALTREX) tablet 1,000 mg  1,000 mg Oral BID Hammons, Theone Murdoch, [facility]         Discharge Medications: Please see discharge summary for a list of discharge medications.  Relevant Imaging Results:  Relevant Lab Results:   Additional Information SSN [id number] [place], [place]

## 2021-09-22 NOTE — Progress Notes (Signed)
Receive critical lab of 2.7 per lab. Teaching services paged. Awaiting response. Will continue to monitor.

## 2021-09-22 NOTE — Evaluation (Signed)
Occupational Therapy Evaluation Patient Details Name: Mark Cabrera MRN: [id number] DOB: 1950-08-11 Today's Date: 09/22/2021   History of Present Illness 71 y.o. male presents to [facility] hospital on 09/21/2021 with 2 weeks of dizziness and malaise. Pt found to be hypotensive and with elevated A1c. Pt and daughter report progressive memory loss. PMH includes AKI, CHF, Afib, CAD s/p CABG, DM, GERD, HLD, HTN.   Clinical Impression   Per patient, he is typically mod I for mobility and ADL/IADL. 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 He states that he does not really cook anymore. Today he was min guard for sit<>stand transfer and steps up the bed, he is able to reach his feet and face for ADL with supervision/min guard. Primary focus was functional cognitive assessment. He perseverated on gardening throughout the session, but was easily redirected. He was very pleasant and cooperative throughout the session.  Pt scored 7/10 on the Medi Cog assessment, which is indicative of cognitive deficits - and especially struggled with the medication transfer portion (simulating filling in a pill box with 4 medications). He was able to recall 2/3 of the three word recall and draw a clock accurately scoring 2/2 points. However, Pt scored 3/5 on the medication transfer. It took him 84mn and 24 seconds to complete (over 5 min time limit) he also asked multiple times for clarification from the OT. He could not figure out how to complete the task "Take one tablet 3 times daily with meals" despite max coaching/cues/education. He forgot the 5th question where you total the number of pills for Saturday. An overall score 8/10 is necessary to indicate adequate cognitive abilities.     Recommendations for follow up therapy are one component of a multi-disciplinary discharge planning process, led by the attending physician.  Recommendations may be updated based on patient status, additional  functional criteria and insurance authorization.   Follow Up Recommendations  Skilled nursing-short term rehab (<3 hours/day)    Assistance Recommended at Discharge Frequent or constant Supervision/Assistance  Patient can return home with the following A little help with walking and/or transfers;A little help with bathing/dressing/bathroom;Assistance with cooking/housework;Direct supervision/assist for medications management;Direct supervision/assist for financial management;Assist for transportation;Help with stairs or ramp for entrance    Functional Status Assessment  Patient has had a recent decline in their functional status and demonstrates the ability to make significant improvements in function in a reasonable and predictable amount of time.  Equipment Recommendations  None recommended by OT    Recommendations for Other Services Speech consult (cognition)     Precautions / Restrictions Precautions Precautions: Fall Precaution Comments: poor memory Restrictions Weight Bearing Restrictions: No      Mobility Bed Mobility Overal bed mobility: Needs Assistance Bed Mobility: Supine to Sit, Sit to Supine     Supine to sit: Supervision Sit to supine: Supervision   General bed mobility comments: for line management    Transfers Overall transfer level: Needs assistance Equipment used: 1 person hand held assist Transfers: Sit to/from Stand Sit to Stand: Supervision           General transfer comment: steps up the side of the bed, no DME used      Balance Overall balance assessment: Needs assistance Sitting-balance support: No upper extremity supported, Feet supported Sitting balance-Leahy Scale: Good     Standing balance support: Single extremity supported, Bilateral upper extremity supported, Reliant on assistive device for balance Standing balance-Leahy Scale: Poor  ADL either performed or assessed with clinical  judgement   ADL Overall ADL's : Needs assistance/impaired Eating/Feeding: Modified independent;Sitting   Grooming: Supervision/safety;Standing   Upper Body Bathing: Supervision/ safety;Sitting   Lower Body Bathing: Min guard   Upper Body Dressing : Set up;Sitting   Lower Body Dressing: Min guard;Sit to/from stand Lower Body Dressing Details (indicate cue type and reason): able to don socks sitting EOB Toilet Transfer: Min guard;Ambulation Toilet Transfer Details (indicate cue type and reason): steps up the bed Toileting- Clothing Manipulation and Hygiene: Min guard       Functional mobility during ADLs: Min guard (steps up the bed)       Vision Ability to See in Adequate Light: 1 Impaired Patient Visual Report: No change from baseline Vision Assessment?: Vision impaired- to be further tested in functional context Additional Comments: 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     Perception     Praxis      Pertinent Vitals/Pain Pain Assessment Pain Assessment: No/denies pain     Hand Dominance Right   Extremity/Trunk Assessment Upper Extremity Assessment Upper Extremity Assessment: Overall WFL for tasks assessed   Lower Extremity Assessment Lower Extremity Assessment: Defer to PT evaluation   Cervical / Trunk Assessment Cervical / Trunk Assessment: Normal   Communication Communication Communication: No difficulties   Cognition Arousal/Alertness: Awake/alert Behavior During Therapy: WFL for tasks assessed/performed Overall Cognitive Status: Impaired/Different from baseline Area of Impairment: Attention, Memory, Following commands, Safety/judgement, Awareness, Problem solving                   Current Attention Level: Sustained Memory: Decreased short-term memory (repeated himself frequently throughout the session) Following Commands: Follows one step commands consistently, Follows multi-step commands inconsistently Safety/Judgement:  Decreased awareness of safety, Decreased awareness of deficits Awareness: Emergent Problem Solving: Requires verbal cues General Comments: Pt scored 7/10 on the medi cog assessment. Able to recall 2/3 of the three word recall and draw a clock accurately scoring 2/2 points. However, Pt scored 3/5 on the medication transfer. It took him 83mn and 24 seconds to complete (over time) he also asked multiple times for clarification from the OT. He could not figure out how to complete the task "Take one tablet 3 times daily with meals" despite max coaching/cues/education. He forgot the 5th question where you total the number of pills for Saturday. A score 8/10 is necessary to indicate adequate cognitive abilities.     General Comments  VSS on RA    Exercises     Shoulder Instructions      Home Living Family/patient expects to be discharged to:: Private residence Living Arrangements: Other (Comment) (roommate) Available Help at Discharge: Other (Comment) (roommate PRN) Type of Home: House Home Access: Stairs to enter;Ramped entrance Entrance Stairs-Number of Steps: 3 Entrance Stairs-Rails: Right Home Layout: One level     Bathroom Shower/Tub: WOccupational psychologist Standard     Home Equipment: Cane - single point;Rollator (4 wheels);Shower seat;Grab bars - tub/shower          Prior Functioning/Environment Prior Level of Function : Independent/Modified Independent             Mobility Comments: pt reports ambulating without an assistive device at baseline ADLs Comments: decline in memory recently per chart review, especially with medication compliance        OT Problem List: Decreased activity tolerance;Impaired balance (sitting and/or standing);Decreased cognition;Decreased safety awareness      OT Treatment/Interventions:  Self-care/ADL training;Therapeutic activities;Cognitive remediation/compensation;Patient/family education;Balance training    OT Goals(Current  goals can be found in the care plan section) Acute Rehab OT Goals Patient Stated Goal: get back to gardening OT Goal Formulation: With patient Time For Goal Achievement: 10/06/21 Potential to Achieve Goals: Fair ADL Goals Pt Will Perform Grooming: with modified independence;standing Pt Will Perform Tub/Shower Transfer: Tub transfer;with supervision;ambulating Additional ADL Goal #1: Pt will implement at least 2 memory strategies during ADL routine Additional ADL Goal #2: Pt will complete and pass pill box assessment with <2 errors  OT Frequency: Min 2X/week    Co-evaluation              AM-PAC OT "6 Clicks" Daily Activity     Outcome Measure Help from another person eating meals?: None Help from another person taking care of personal grooming?: A Little Help from another person toileting, which includes using toliet, bedpan, or urinal?: A Little Help from another person bathing (including washing, rinsing, drying)?: A Little Help from another person to put on and taking off regular upper body clothing?: A Little Help from another person to put on and taking off regular lower body clothing?: A Little 6 Click Score: 19   End of Session Nurse Communication: Mobility status  Activity Tolerance: Patient tolerated treatment well Patient left: in bed;with call bell/phone within reach;with bed alarm set  OT Visit Diagnosis: Unsteadiness on feet (R26.81);Muscle weakness (generalized) (M62.81);Other symptoms and signs involving cognitive function                Time: 8144-8185 OT Time Calculation (min): 27 min Charges:  OT General Charges $OT Visit: 1 Visit OT Evaluation $OT Eval Low Complexity: 1 Low OT Treatments $Cognitive Funtion inital: Initial 15 mins  Jesse Sans OTR/L Acute Rehabilitation Services Office: [place] 09/22/2021, 1:09 PM

## 2021-09-22 NOTE — Evaluation (Signed)
Physical Therapy Evaluation Patient Details Name: LESLY JOSLYN MRN: [id number] DOB: [birth year] Today's Date: 09/22/2021  History of Present Illness  71 y.o. male presents to [facility] hospital on 09/21/2021 with 2 weeks of dizziness and malaise. Pt found to be hypotensive and with elevated A1c. Pt and daughter report progressive memory loss. PMH includes AKI, CHF, Afib, CAD s/p CABG, DM, GERD, HLD, HTN.  Clinical Impression  Pt presents to PT with deficits in gait, balance, cognition, memory. Pt demonstrates increased lateral sway despite UE support when mobilizing. Pt reports a history of falls and increase difficulty maintaining balance over the last 6 months. Pt with significant cognitive deficits, disoriented to time of day, unable to recall PT commands to find room after multiple attempts. Pt is at an increased risk for falls and reports little caregiver support within the home. PT recommends SNF placement at this time. Pt would likely benefit from ALF placement long term if he is agreeable.       Recommendations for follow up therapy are one component of a multi-disciplinary discharge planning process, led by the attending physician.  Recommendations may be updated based on patient status, additional functional criteria and insurance authorization.  Follow Up Recommendations Skilled nursing-short term rehab (<3 hours/day) Can patient physically be transported by private vehicle: Yes    Assistance Recommended at Discharge Frequent or constant Supervision/Assistance  Patient can return home with the following  A little help with walking and/or transfers;A little help with bathing/dressing/bathroom;Direct supervision/assist for medications management;Direct supervision/assist for financial management;Assistance with cooking/housework;Assist for transportation    Equipment Recommendations None recommended by PT  Recommendations for Other Services       Functional Status Assessment Patient has had  a recent decline in their functional status and demonstrates the ability to make significant improvements in function in a reasonable and predictable amount of time.     Precautions / Restrictions Precautions Precautions: Fall Precaution Comments: poor memory Restrictions Weight Bearing Restrictions: No      Mobility  Bed Mobility                    Transfers Overall transfer level: Needs assistance Equipment used: Rolling walker (2 wheels) Transfers: Sit to/from Stand Sit to Stand: Supervision                Ambulation/Gait Ambulation/Gait assistance: Min guard Gait Distance (Feet): 400 Feet Assistive device: Rolling walker (2 wheels) Gait Pattern/deviations: Drifts right/left Gait velocity: reduced Gait velocity interpretation: <1.8 ft/sec, indicate of risk for recurrent falls   General Gait Details: pt with increased lateral drift, often nearly bumping into objects in hallway. Pt utilizes steping strategy to correct for multiple losses of balance. Pt declines attempts of ambulation without walker due to instability  Stairs            Wheelchair Mobility    Modified Rankin (Stroke Patients Only)       Balance Overall balance assessment: Needs assistance Sitting-balance support: No upper extremity supported, Feet supported Sitting balance-Leahy Scale: Good     Standing balance support: Single extremity supported, Bilateral upper extremity supported, Reliant on assistive device for balance Standing balance-Leahy Scale: Poor                               Pertinent Vitals/Pain Pain Assessment Pain Assessment: No/denies pain    Home Living Family/patient expects to be discharged to:: Private residence Living Arrangements: Other (Comment) (roommate) Available  Help at Discharge: Other (Comment) (roommate PRN) Type of Home: House Home Access: Stairs to enter;Ramped entrance Entrance Stairs-Rails: Right Entrance Stairs-Number of  Steps: 3   Home Layout: One level Home Equipment: Cane - single point;Rollator (4 wheels);Shower seat;Grab bars - tub/shower      Prior Function Prior Level of Function : Independent/Modified Independent             Mobility Comments: pt reports ambulating without an assistive device at baseline ADLs Comments: decline in memory recently per chart review     Hand Dominance   Dominant Hand: Right    Extremity/Trunk Assessment   Upper Extremity Assessment Upper Extremity Assessment: Overall WFL for tasks assessed    Lower Extremity Assessment Lower Extremity Assessment: Overall WFL for tasks assessed    Cervical / Trunk Assessment Cervical / Trunk Assessment: Normal  Communication   Communication: No difficulties  Cognition Arousal/Alertness: Awake/alert Behavior During Therapy: WFL for tasks assessed/performed Overall Cognitive Status: Impaired/Different from baseline Area of Impairment: Orientation, Attention, Memory, Following commands, Safety/judgement, Awareness, Problem solving                 Orientation Level: Disoriented to, Time Current Attention Level: Sustained Memory: Decreased short-term memory Following Commands: Follows one step commands consistently, Follows multi-step commands inconsistently Safety/Judgement: Decreased awareness of safety, Decreased awareness of deficits Awareness: Emergent Problem Solving: Requires verbal cues General Comments: pt is able to recall room number but unable to recall command to attempt to find room number, also quickly forgets PT cues for which side of hallway the room is one after PT cues        General Comments General comments (skin integrity, edema, etc.): VSS on RA    Exercises     Assessment/Plan    PT Assessment Patient needs continued PT services  PT Problem List Decreased balance;Decreased mobility;Decreased knowledge of use of DME;Decreased knowledge of precautions       PT Treatment  Interventions DME instruction;Gait training;Stair training;Functional mobility training;Therapeutic exercise;Therapeutic activities;Balance training;Neuromuscular re-education;Cognitive remediation;Patient/family education    PT Goals (Current goals can be found in the Care Plan section)  Acute Rehab PT Goals Patient Stated Goal: to improve balance and restore independence in mobility PT Goal Formulation: With patient Time For Goal Achievement: 10/06/21 Potential to Achieve Goals: Good Additional Goals Additional Goal #1: PT will score >19/24 on the DGI to indicate a reduced risk for falls    Frequency Min 2X/week     Co-evaluation               AM-PAC PT "6 Clicks" Mobility  Outcome Measure Help needed turning from your back to your side while in a flat bed without using bedrails?: A Little Help needed moving from lying on your back to sitting on the side of a flat bed without using bedrails?: A Little Help needed moving to and from a bed to a chair (including a wheelchair)?: A Little Help needed standing up from a chair using your arms (e.g., wheelchair or bedside chair)?: A Little Help needed to walk in hospital room?: A Little Help needed climbing 3-5 steps with a railing? : A Little 6 Click Score: 18    End of Session   Activity Tolerance: Patient tolerated treatment well Patient left: in bed;with call bell/phone within reach;with bed alarm set Nurse Communication: Mobility status PT Visit Diagnosis: Other abnormalities of gait and mobility (R26.89)    Time: 0258-5277 PT Time Calculation (min) (ACUTE ONLY): 14 min   Charges:  PT Evaluation $PT Eval Low Complexity: 1 Low          Zenaida Niece, PT, DPT Acute Rehabilitation Office [place] 09/22/2021, 9:38 AM

## 2021-09-22 NOTE — Progress Notes (Addendum)
Subjective:  Pt reports improvement in how he is feeling. He does not feel 100% back to himself. He reports continued lightheadedness with standing. He denies any shortness of breath. He has been able to eat and drink without issues. He denies a BM today or yesterday.   Objective:  Vital signs in last 24 hours: Vitals:   09/21/21 1822 09/21/21 2024 09/22/21 0600  BP: 103/61 101/62 138/84  Pulse: 66 71 71  Resp: 16  20  Temp: 97.8 F (36.6 C) 98.1 F (36.7 C) 98.1 F (36.7 C)  TempSrc: Oral Oral Oral  SpO2: 100% 100% 100%  Weight: 86.8 kg  85.5 kg  Height: '5\' 8"'$  (1.727 m)  '5\' 8"'$  (1.727 m)   Weight change:   Intake/Output Summary (Last 24 hours) at 09/22/2021 0718 Last data filed at 09/22/2021 0440 Gross per 24 hour  Intake 236 ml  Output 805 ml  Net -569 ml   General: Resting comfortably in chair, no acute distress HENT: Normocephalic, atraumatic. Moist mucus membranes.  CV: Regular rate, rhythm. No murmurs appreciated. Warm extremities.  Pulm: Normal work of breathing on room air. Clear to ausculation bilaterally.  No wheezing noted on exam GI: Abdomen soft, non-tender, non-distended. Normoactive bowel sounds.  MSK: Normal bulk, tone.  Unable to stand without assistance .1+ pitting edema bilateral lower extremities.  2+ peripheral pulses in lower extremities Skin: Warm, dry. Neuro: Awake, alert, grossly non-focal. Oriented to person, time, and event. Not oriented to place.  Assessment/Plan:  Principal Problem:   Hypotension Active Problems:   Physical deconditioning   Mark Cabrera is a 71 yo male with the PMH of DM, HLD, HTN, diastolic HF, CAD s/p CABG, A-fib, sleep apnea, compensated cirrhosis (by ultrasound on 2021), and CVA admitted for worsening hypotension and uncontrolled diabetes in the setting of worsening cognitive impairment    Hypotension Presented yesterday with hypotension, 80/50. Likely hypovolemic hypotension in the setting of poor p.o. intake,  occasional mismanagement, and deconditioning due to progressive memory impairment. BP with significant response to IV hydration, 120/70. His HR is normal, sating well on room air, afebrile. - Monitoring BP - Falls risk with PT assessment - Orthostatic vitals completed  - Holding antihypertensives    Mild cognitive impairment Pt presented today after appointment with his daughter, Mark Cabrera. She reports he has not been doing well at home, including medication non-compliance, multiple falls, lack of cleanliness, lack of food/water. Concern pt has progressed to dementia with his symptoms possible vascular dementia given multiple stokes and medication mismanagement. He has been checked for reversible causes in the past, including B12 and TSH, both of which were normal. Mark Cabrera is seeking out to become patient's guardian. Will discuss with TOC/social work. UA negative. - Vitamin B12 and TSH, nml - MOCA pending - PT/OT - Delirium Precautions   Diabetes mellitus with neurological manifestation (HCC) Patient's A1c >14.0% today w/ CBG 160. Of note, patient's daughter reports he has trouble reading the insulin pen. Will need to find best solution for him to take insulin safely. - 15 Semglee with moderate SSI with meal time correction - CBG monitoring - Holding home Metformin - Lactic acid, in process   Diastolic Heart Failure Pt with history of diastolic HF. EF 50-55% unchanged from 2022. Pt appears to be mildly fluid overloaded with 1+ pitting edema. BNP 317 - ECHO showed no wall motion abnormalities. Bioprosthetic aortic value with mean gradient of 24 mmHg elevated from prior with appearance of some limitation of valve opening. No  regurgitation. - Outpatient f/u with Cardiology - CXR clear - strict ins/outs - Telemetry   Atrial Fibrillation Pt with history of A-fib, anticoagulated on Eliquis. Currently in regular rhythm. Unclear if patient has been compliant with medication at home. -Telemetry   -Continue Eliquis   Cirrhosis  Pt with prior RUQ [place] in 2021 that showed cirrhotic hepatic morphology without evidence of focal lesion.  Patient also with a history of leukopenia and low platelets.  No fluid wave on exam.  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  Patient was previously treated with Valtrex. -Continue Valtrex    Deconditioning SNF placement Pt's daughter requesting help with SNF placement reporting pt is unable to support IADLs or ADLs. Mark Cabrera is seeking out to become patient's guardian. Will discuss with TOC/social work. - PT/OT - Consult TOC and case management    Best Practice Diet: Carb Modified VTE: Eliquis Bowel: Senna Code: Full   Dispo: Admit patient to Inpatient with expected length of stay greater than 2 midnights.     LOS: 0 days   Mark Cabrera, Medical Student 09/22/2021, 7:18 AM

## 2021-09-22 NOTE — Progress Notes (Signed)
  Echocardiogram 2D Echocardiogram has been performed.  Joette Catching 09/22/2021, 9:38 AM

## 2021-09-22 NOTE — Progress Notes (Signed)
Internal Medicine Clinic Attending  I saw and evaluated the patient.  I personally confirmed the key portions of the history and exam documented by Dr. Braswell and I reviewed pertinent patient test results.  The assessment, diagnosis, and plan were formulated together and I agree with the documentation in the resident's note.  

## 2021-09-22 NOTE — Progress Notes (Signed)
Mobility Specialist Progress Note:   09/22/21 1027  Mobility  Activity Ambulated with assistance to bathroom  Level of Assistance Independent  Assistive Device Front wheel walker  Distance Ambulated (ft) 30 ft  Activity Response Tolerated well  $Mobility charge 1 Mobility   Pt ambulated to BR. No complaints. Left in chair.   [facility] Mark Cabrera Mobility Specialist

## 2021-09-22 NOTE — TOC Initial Note (Addendum)
Transition of Care [facility]) - Initial/Assessment Note    Patient Details  Name: Mark Cabrera MRN: [id number] Date of Birth: [birth year]  Transition of Care [facility]) CM/SW Contact:    Bethann Berkshire, [place] Phone Number: 09/22/2021, 12:35 PM  Clinical Narrative:           CSW called daughter [name] to discuss SNF rec. No answer; CSW left voicemail requesting return call.          1430: CSW met with pt to discuss SNF. Pt states he rents a room in a woman's house and explains she is in her 67's and is currently in SNF herself. He is agreeable to SNF workup and prefers a facility in the [place] area as this is where his daughter and siblings live. CSW completed fl2 and faxed bed requests in hub. PASRR pending.   Expected Discharge Plan: Skilled Nursing Facility Barriers to Discharge: Continued Medical Work up, SNF Pending bed offer   Patient Goals and CMS Choice        Expected Discharge Plan and Services Expected Discharge Plan: [place]       Living arrangements for the past 2 months: Single Family Home                                      Prior Living Arrangements/Services Living arrangements for the past 2 months: Single Family Home                     Activities of Daily Living Home Assistive Devices/Equipment: Dentures (specify type) ADL Screening (condition at time of admission) Patient's cognitive ability adequate to safely complete daily activities?: Yes Is the patient deaf or have difficulty hearing?: No Does the patient have difficulty seeing, even when wearing glasses/contacts?: No Does the patient have difficulty concentrating, remembering, or making decisions?: Yes Patient able to express need for assistance with ADLs?: Yes Does the patient have difficulty dressing or bathing?: No Independently performs ADLs?: Yes (appropriate for developmental age) Does the patient have difficulty walking or climbing stairs?: Yes Weakness of Legs:  Both Weakness of Arms/Hands: None  Permission Sought/Granted                  Emotional Assessment       Orientation: : Oriented to Self, Oriented to Place      Admission diagnosis:  Hypotension [I95.9] Physical deconditioning [R53.81] Patient Active Problem List   Diagnosis Date Noted   Physical deconditioning 09/21/2021   Otitis externa 12/31/2020   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   Balanitis 50/11/3816   Complicated urinary tract infection    Thrombocytopenia (HCC)    Hyponatremia 11/08/2020   Left shoulder pain 10/20/2020   Dyspnea 09/23/2020   Mild cognitive impairment 08/25/2020   Hip pain, chronic, left 07/05/2020   Epididymitis 04/14/2020   Fatigue 03/18/2020   Hypotension 03/10/2020   Abdominal wall bulge 12/03/2019   Dermoid inclusion cyst 08/07/2019   Chronic left shoulder pain 05/13/2019   Depression 11/05/2018   Blurry vision, bilateral 09/20/2018   CVA (cerebral vascular accident) ([place]) 06/28/2018   Hepatic cirrhosis ([place]) 06/29/2017   Healthcare maintenance 12/16/2016   Lipoma of left upper extremity 09/12/2016   Chronic knee pain 07/04/2016   Decreased visual acuity 07/04/2016   Pleural effusion on left, s/p throacentesis 04/18/16 04/28/2016   Paroxysmal atrial fibrillation ([place]) 04/16/2016   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 [place]  [address] Phone: [phone] Fax: [phone]     Social Determinants of Health (SDOH) Interventions    Readmission Risk Interventions     No data to display

## 2021-09-23 ENCOUNTER — Inpatient Hospital Stay (HOSPITAL_COMMUNITY): Payer: Medicare Other

## 2021-09-23 ENCOUNTER — Other Ambulatory Visit: Payer: Self-pay | Admitting: *Deleted

## 2021-09-23 DIAGNOSIS — E440 Moderate protein-calorie malnutrition: Secondary | ICD-10-CM | POA: Insufficient documentation

## 2021-09-23 DIAGNOSIS — I9589 Other hypotension: Secondary | ICD-10-CM | POA: Diagnosis not present

## 2021-09-23 DIAGNOSIS — E861 Hypovolemia: Secondary | ICD-10-CM | POA: Diagnosis not present

## 2021-09-23 LAB — BASIC METABOLIC PANEL
Anion gap: 11 (ref 5–15)
BUN: 15 mg/dL (ref 8–23)
CO2: 23 mmol/L (ref 22–32)
Calcium: 9 mg/dL (ref 8.9–10.3)
Chloride: 103 mmol/L (ref 98–111)
Creatinine, Ser: 0.95 mg/dL (ref 0.61–1.24)
GFR, Estimated: >60 mL/min (ref >60)
Glucose, Bld: 254 mg/dL — ABNORMAL HIGH (ref 70–99)
Potassium: 3.6 mmol/L (ref 3.5–5.1)
Sodium: 137 mmol/L (ref 135–145)

## 2021-09-23 LAB — LACTIC ACID, PLASMA
Lactic Acid, Venous: 2.8 mmol/L (ref 0.5–1.9)
Lactic Acid, Venous: 1.5 mmol/L (ref 0.5–1.9)

## 2021-09-23 LAB — GLUCOSE, CAPILLARY
Glucose-Capillary: 265 mg/dL — ABNORMAL HIGH (ref 70–99)
Glucose-Capillary: 246 mg/dL — ABNORMAL HIGH (ref 70–99)
Glucose-Capillary: 242 mg/dL — ABNORMAL HIGH (ref 70–99)
Glucose-Capillary: 329 mg/dL — ABNORMAL HIGH (ref 70–99)

## 2021-09-23 LAB — CBC
HCT: 30.3 % — ABNORMAL LOW (ref 39.0–52.0)
Hemoglobin: 10.7 g/dL — ABNORMAL LOW (ref 13.0–17.0)
MCH: 31.4 pg (ref 26.0–34.0)
MCHC: 35.3 g/dL (ref 30.0–36.0)
MCV: 88.9 fL (ref 80.0–100.0)
Platelets: 145 10*3/uL — ABNORMAL LOW (ref 150–400)
RBC: 3.41 MIL/uL — ABNORMAL LOW (ref 4.22–5.81)
RDW: 13.2 % (ref 11.5–15.5)
WBC: 2.3 10*3/uL — ABNORMAL LOW (ref 4.0–10.5)
nRBC: 0 % (ref 0.0–0.2)

## 2021-09-23 MED ORDER — HYDROXYZINE HCL 10 MG/5ML PO SYRP
10.0000 mg | ORAL_SOLUTION | Freq: Three times a day (TID) | ORAL | Status: DC | PRN
Start: 2021-09-23 — End: 2021-09-28
  Administered 2021-09-23 – 2021-09-26 (×4): 10 mg via ORAL
  Filled 2021-09-23 (×7): qty 5

## 2021-09-23 MED ORDER — INSULIN ASPART 100 UNIT/ML IJ SOLN
2.0000 [IU] | Freq: Three times a day (TID) | INTRAMUSCULAR | Status: AC
  Administered 2021-09-24 – 2021-09-25 (×5): 2 [IU] via SUBCUTANEOUS

## 2021-09-23 MED ORDER — FUROSEMIDE 40 MG PO TABS
40.0000 mg | ORAL_TABLET | Freq: Every day | ORAL | Status: DC
  Administered 2021-09-23 – 2021-09-25 (×3): 40 mg via ORAL
  Filled 2021-09-23 (×3): qty 1

## 2021-09-23 MED ORDER — LACTATED RINGERS IV BOLUS
250.0000 mL | Freq: Once | INTRAVENOUS | Status: AC
Start: 2021-09-23 — End: 2021-09-23
  Administered 2021-09-23: 250 mL via INTRAVENOUS

## 2021-09-23 MED ORDER — INSULIN GLARGINE-YFGN 100 UNIT/ML ~~LOC~~ SOLN
18.0000 [IU] | Freq: Every day | SUBCUTANEOUS | Status: DC
  Administered 2021-09-23: 18 [IU] via SUBCUTANEOUS
  Filled 2021-09-23: qty 0.18

## 2021-09-23 NOTE — Progress Notes (Signed)
Patient with increased restlessness and agitation.  c/o feeling short of breath. O2 100% on  '@2L'$  . VSS. On call MD page regarding patient condition. Patient verbalized that he has worn a mask to help with breathing in the past. MD at bedside to evaluate patient .

## 2021-09-23 NOTE — TOC Initial Note (Addendum)
Transition of Care [facility]) - Initial/Assessment Note    Patient Details  Name: Mark Cabrera MRN: [id number] Date of Birth: [birth year]  Transition of Care [facility]) CM/SW Contact:    Bethann Berkshire, [place] Phone Number: 09/23/2021, 10:19 AM  Clinical Narrative:                   Expected Discharge Plan: [place] Barriers to Discharge: Continued Medical Work up, SNF Pending bed offer   Patient Goals and CMS Choice    CSW received voicemail from pt daughter [name]; [place] called back to discuss disposition. D[place] who is currently in a SNF herself. She states he is unable to take care of himself and forgets basic things like when he last ate or showering and that he is incapable of managing his medications. She states she filed an APS report with [facility] recently though pt would not allow APS worker in his home when they did a home visit. [name] is wanting to explore guardianship due to possible dementia and inability to care for himself. She states as recently as yesterday pt said he did not want to live anymore though that this is not necessarily abnormal for him to say. She is hoping psych and neuro could be consulted for these concerns. MD notified via secure chat. She states longterm she would like to explore ALF/memory care though she has arranged this in the past and pt had left the facilities. CSW explained that PT recommending STR at SNF and that hospital generally does not hold pt's for ALF placement. CSW [employer] list of ALF's that take medicaid and have dementia units. CSW provided SNF offers and explained pt was initially agreeable yesterday. Mary's first choice is [facility]; CSW will follow up to confirm bed offer.   CSW called [facility] APS worker Doreen Beam at [phone]; no answer, left voicemail requesting return call.   [place] liaison; awaiting response.   1310: Received callback from  [place]. CSW provided update. Corene Cornea stated he had tried to visit pt at his home on 4 occasions with the last attempt being on  09/20/21. He explained no one came to door or acknowledged they were home on each occasion; stated he tried front and side door each time. Case still open and Corene Cornea was working with pt's daughter to coordinate meeting with pt though pt now in the hospital. CSW provided room number and unit phone number to [place]. The earliest he can come is Monday; he is already scheduled to be visiting [facility] on Monday and if pt is still here, states he will meet with pt first.  Earliest Monday morning.   1340: [facility] confirmed they can accept pt on Monday        Expected Discharge Plan and Services Expected Discharge Plan: [place] arrangements for the past 2 months: Single Family Home                                      Prior Living Arrangements/Services Living arrangements for the past 2 months: Single Family Home                     Activities of Daily Living Home Assistive Devices/Equipment: Dentures (specify type) ADL Screening (condition at  time of admission) Patient's cognitive ability adequate to safely complete daily activities?: Yes Is the patient deaf or have difficulty hearing?: No Does the patient have difficulty seeing, even when wearing glasses/contacts?: No Does the patient have difficulty concentrating, remembering, or making decisions?: Yes Patient able to express need for assistance with ADLs?: Yes Does the patient have difficulty dressing or bathing?: No Independently performs ADLs?: Yes (appropriate for developmental age) Does the patient have difficulty walking or climbing stairs?: Yes Weakness of Legs: Both Weakness of Arms/Hands: None  Permission Sought/Granted                  Emotional Assessment       Orientation: : Oriented to Self, Oriented to Place      Admission  diagnosis:  Hypotension [I95.9] Physical deconditioning [R53.81] Symptomatic hypotension [I95.9] Patient Active Problem List   Diagnosis Date Noted   Malnutrition of moderate degree 09/23/2021   Symptomatic hypotension 09/22/2021   Physical deconditioning 09/21/2021   Otitis externa 12/31/2020   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   Balanitis 94/85/4627   Complicated urinary tract infection    Thrombocytopenia (HCC)    Hyponatremia 11/08/2020   Left shoulder pain 10/20/2020   Dyspnea 09/23/2020   Mild cognitive impairment 08/25/2020   Hip pain, chronic, left 07/05/2020   Epididymitis 04/14/2020   Fatigue 03/18/2020   Hypotension 03/10/2020   Abdominal wall bulge 12/03/2019   Dermoid inclusion cyst 08/07/2019   Chronic left shoulder pain 05/13/2019   Depression 11/05/2018   Blurry vision, bilateral 09/20/2018   CVA (cerebral vascular accident) ([place]) 06/28/2018   Hepatic cirrhosis ([place]) 06/29/2017   Healthcare maintenance 12/16/2016   Lipoma of left upper extremity 09/12/2016   Chronic knee pain 07/04/2016   Decreased visual acuity 07/04/2016   Pleural effusion on left, s/p throacentesis 04/18/16 04/28/2016   Paroxysmal atrial fibrillation ([place]) 04/16/2016   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 [place] [place] [address] Phone: [phone] Fax: [phone]     Social Determinants of Health  (SDOH) Interventions    Readmission Risk Interventions     No data to display

## 2021-09-23 NOTE — Consult Note (Signed)
   [facility] CM Inpatient Consult   09/23/2021  Mark Cabrera 1950-09-08 [id number]  [employer] [ACO] Patient: Medicare ACO REACH  Primary Care Provider:  [name], MD  with [facility] is an embedded provider with a Chronic Care Management team and program, and is listed for the transition of care follow up and appointments.  0918: Progression meeting discussion patient for possible SNF and discussion for barriers in returning to community.  Patient was reviewed for history with Embedded practice services in chronic care management team for care coordination.  Noted ongoing barriers as discussed with inpatient TOC team. Please see inpatient TOC LCSW notes for details.   Patient is currently being recommended for a skilled nursing/assisted living for post hospital needs.  Plan:  Continue to follow notification sent to the [place] [facility] social worker and made aware of hospitalization.  Please contact for further questions,  Natividad Brood, RN BSN [facility] Liaison  [phone] business mobile phone Toll free office [phone]  Fax number: [phone] [email or web address]

## 2021-09-23 NOTE — Patient Outreach (Signed)
Telephone call received from [place] and [place] from [facility] and [place] inquiring whether or not Mark Cabrera is on the approved facilities list for the [place] SNF Waiver. Confirmed with [facility] Post-Acute Director, Barnett Applebaum, that Mark Cabrera is not listed as approved facility for [place] SNF Waiver. Made [name] and [name] aware.   Writer spoke with [name], LCSW to inform Mark Cabrera is not on the list. Writer provided the list of facilities that can accept [facility] ACO Reach 3-day SNF Waiver.    [name], MSN, RN,BSN [place] Acute Care Coordinator [phone] [facility]) [phone]  (Toll free office)

## 2021-09-23 NOTE — Progress Notes (Addendum)
Patient's lactic acid 2.8, up from 2.2 s/p 250cc bolus.   Patient seen and evaluated at bedside. He was resting comfortably in bed. No complaints. Cooperative. No orthopnea. Denies SOB. No pain.  PE: resting comfortably in bed. NAD Heart: RRR, 3/6 systolic murmur, No JVD, no LEE. Extremities are warm and well perfused Lungs: CTAB, no crackles, breathing comfortably with 2LNC Abd: non-peritonitic Neuro: responds appropriately to questioning, nonfocal Psych: mood and affect appropriate  A/P: No signs of infection, decompensation of cirrhosis, or poor perfusion or hypotension. He has no signs of volume overload on exam.  Elevated lactic likely related to previously described dehydration. Will give another small bolus of 250cc LR and continue to trend lactic.   ADDENDUM:  Paged for anxiety and SOB. Satting 100% on 2L.  Upon arrival to room, patient was resting comfortably in bed. He stated that he felt as though he was gasping for air while trying to sleep lying flat, improved upon sitting up. No longer have any respiratory symptoms, but does endorse anxiety relating to his SOB. States he used to wear mask to sleep in the past and is requesting this.. Also complains of some leg pain bilaterally. Says legs are sensitive to the touch. Chronic according to patient. No other complaints.   PE:  General: NAD Heart: RRR, systolic murmur, warm extremities, trace edema left leg, no edema right. No dependent edema around waist. Warm and well perfused.  Lungs: CTAB, no crackles, satting 100% RA ABD: soft, NT, +BS, nonperitonitic Neuro: nonfocal Psych: mildly anxious  A/P- Though his exam appears largely unremarkable, patient's symptoms suspicious for CHF, and he received approx 1L IVFs since 6pm yesterday. We will obtain CXR to evaluate for fluid overload. If no evidence of volume overload, his symptoms may be due to his hx of OSA exacerbated by anxiety.  He is satting well on 2L, but given his hx of  OSA and anxiety, will provide CPAP for comfort and  hydroxyzine to help with his anxiety. He is otherwise hemodynamically stable with no evidence of respiratory decompensation. PRN tylenol for leg pain.  Lastly, patient's lactic acid has yet to be drawn. I have asked the nurse to ensure that this is redrawn. Will follow this up as able.

## 2021-09-23 NOTE — TOC Progression Note (Addendum)
Transition of Care [facility]) - Progression Note    Patient Details  Name: Mark Cabrera MRN: [id number] Date of Birth: November 17, 1950  Transition of Care [facility]) CM/SW [place], [place] Phone Number: 09/23/2021, 3:39 PM  Clinical Narrative:     CSW is notified by MD that pt may not need inpatient stay over the weekend which means pt may not meet 3 midnight medicare requirement for SNF. Alternative would be that pt goes to SNF under medicaid though medicaid beds are much more limited. MD communicated that pt would not be safe to return home alone. CSW will seek medicaid snf.   7494: After consulting with additional TOC staff; pt is a [facility]N pt and may qualify for 3 midnight waiver if placed at a [facility] approved SNF. CSW contacted [facility]; she will inquire with her leadership if it would be possible to accept pt under 3 midnight medicare waiver.   1610: CSW spoke with [place] manager; non of the facilities that offered pt a bed are on [place] waiver list. CSW contacted the facilities  that offered to see if they could accept pt under medicaid: [place]: awaiting response [facility]: awaiting response [place]: cant take under [employer]: they cannot accept pt under medicaid  1630: CSW called pt's daughter and updated her. Daughter wants to explore additional facilities outside of [place] that are approved under [facility] 57mdnight waiver list. CSW faxed to additional facilities in the hub.   [facility] [place]   Expected Discharge Plan: Skilled Nursing Facility Barriers to Discharge: Continued Medical Work up, SNF Pending bed offer  Expected Discharge Plan and Services Expected Discharge Plan: [place] arrangements for the past 2 months: Single Family Home                                       Social Determinants of Health (SDOH) Interventions    Readmission Risk Interventions     No data to display

## 2021-09-23 NOTE — Progress Notes (Signed)
Patient took of his CPAP mask and threw it on the floor. RT informed RN.

## 2021-09-23 NOTE — Progress Notes (Signed)
Mobility Specialist Progress Note:   09/23/21 1300  Orthostatic Lying   BP- Lying 141/82  Pulse- Lying 72  Orthostatic Sitting  BP- Sitting 146/86  Pulse- Sitting 84  Orthostatic Standing at 0 minutes  BP- Standing at 0 minutes 131/74  Pulse- Standing at 0 minutes 93  Orthostatic Standing at 3 minutes  BP- Standing at 3 minutes 128/70  Pulse- Standing at 3 minutes 92  Mobility  Activity Stood at bedside  Level of Therapist, art wheel walker  Activity Response Tolerated fair  $Mobility charge 1 Mobility   Pt received in bed. Complaints of feeling dizzy. Left in bed with call bell in reach and all needs met.   [facility] Reade Trefz Mobility Specialist

## 2021-09-23 NOTE — Progress Notes (Addendum)
Hospital day#1  Subjective:    Overnight Events: Patient with elevated lactic acid to 2.7 at 5 PM.  Patient received fluid bolus, lactic acid initially at 2.2, but up trended to 2.8.  Patient received a second 200 cc of LR with a repeated lactic acid at 1.5.  Overnight resident evaluated patient, without signs of respiratory distress, normal orientation, and no signs of infection.  Around 2 AM patient was also reported to have increased restlessness and agitation with subjective short of breath.  Patient was evaluated and found to have 100% oxygen saturation on 2 L, stable vital signs, MD to bedside with unremarkable lung exam and no pitting edema.  Noted that the patient had received 1 L of IVF's since 6 PM on 7/12.  Patient received hydroxyzine for anxiety, and placed on CPAP given OSA, however patient did not tolerate CPAP machine.    Feeling hungry this am, but did endorse shortness of breath and not being able to lay flat in his bed. Did not remember his sister visiting yesterday without prompting.   Objective:  Vital signs in last 24 hours: Vitals:   09/23/21 1003 09/23/21 1156 09/23/21 1200 09/23/21 1600  BP: (!) 148/61 (!) 171/94 (!) 133/115 (!) 148/84  Pulse: 77   70  Resp: '19 19  19  '$ Temp:  98.2 F (36.8 C)  98.1 F (36.7 C)  TempSrc:    Oral  SpO2:  100%    Weight:      Height:       Supplemental O2: Nasal Cannula SpO2: 100 % O2 Flow Rate (L/min): 1 L/min Filed Weights   09/21/21 1822 09/22/21 0600 09/23/21 0445  Weight: 86.8 kg 85.5 kg 85.5 kg    Physical Exam:  Constitutional:ill-appearing man sitting in bed, in no acute distress Cardiovascular: regular rate and rhythm, no m/r/g Pulmonary/Chest: normal work of breathing on room air, lungs clear to auscultation bilaterally. NO wheezing Abdominal: Normal BS, soft, non-tender, non-distended. MSK: normal bulk and tone. No Pitting edema.  Neurological: alert & oriented x self and place. Skin: warm and dry Psych:  appropriate mood and affect    Intake/Output Summary (Last 24 hours) at 09/23/2021 1628 Last data filed at 09/23/2021 1600 Gross per 24 hour  Intake 773 ml  Output 3700 ml  Net -2927 ml   Net IO Since Admission: -3,296 mL [09/23/21 1628]  Pertinent Labs:    Latest Ref Rng & Units 09/23/2021    3:12 AM 09/22/2021    3:07 AM 09/21/2021    4:00 PM  CBC  WBC 4.0 - 10.5 K/uL 2.3  2.4  2.6   Hemoglobin 13.0 - 17.0 g/dL 10.7  9.7  11.0   Hematocrit 39.0 - 52.0 % 30.3  27.4  32.0   Platelets 150 - 400 K/uL 145  155  222        Latest Ref Rng & Units 09/23/2021    3:12 AM 09/22/2021    9:44 AM 09/22/2021    3:07 AM  CMP  Glucose 70 - 99 mg/dL 254  248  160   BUN 8 - 23 mg/dL '15  12  14   '$ Creatinine 0.61 - 1.24 mg/dL 0.95  0.98  0.91   Sodium 135 - 145 mmol/L 137  136  137   Potassium 3.5 - 5.1 mmol/L 3.6  3.5  3.0   Chloride 98 - 111 mmol/L 103  104  103   CO2 22 - 32 mmol/L 23  26  24   Calcium 8.9 - 10.3 mg/dL 9.0  9.2  9.2     Imaging: DG CHEST PORT 1 VIEW  Result Date: 09/23/2021 CLINICAL DATA:  Shortness of breath EXAM: PORTABLE CHEST 1 VIEW COMPARISON:  Chest x-ray dated August 22, 2021 FINDINGS: Unchanged cardiomegaly. Prior aortic valve replacement and CABG. Increased mild bilateral interstitial opacities. No large pleural effusion or pneumothorax. IMPRESSION: Increased mild bilateral interstitial opacities, likely due to pulmonary edema. Electronically Signed   By: Yetta Glassman M.D.   On: 09/23/2021 08:16    Assessment/Plan:   Principal Problem:   Hypotension Active Problems:   Diabetes mellitus with neurological manifestation (HCC)   Chronic diastolic heart failure (HCC)   Paroxysmal atrial fibrillation (HCC)   Physical deconditioning   Symptomatic hypotension   Malnutrition of moderate degree   Patient Summary: Mark Cabrera is a 71 y.o. with a pertinent PMH of DM, HLD, HEENT, heart failure, CAD status post CABG, AFib, sleep apnea, compensated cirrhosis, and  multiple CVAs admitted for hypotension, now being managed for uncontrolled diabetes and fluid overload.  HFpEF 09/23/2021 echo showed LV EF 50 to 55%.  With left ventricle with low normal function.  No motion wall abnormalities.  Overnight patient reported shortness of breath despite 100% saturation.  On exam patient did not seem volume overloaded however CXR showed increased mild bilateral interstitial opacities, likely due to pulmonary edema in the setting of receiving multiple fluid boluses for hypotensive episodes on admission.  No coughing, no dyspnea on exertion. ~1283m output after am dose of lasix. Does not appear he was on home diuretic prior to admission per outpatient med list. -S/p PO furosemide 40 mg, continue to monitor need for further diuresis daily -Monitor oxygen saturation and respiratory symptoms -Weight daily -Strict I/O's  Insulin-dependent DMT2 Patient's A1c greater than 14%.  Fasting CBG today 265.  Previously on 15 units of Semglee.  Still trying to understand patient's glucose trends and insulin needs.  Will manage conservatively by increasing his long acting insulin to 18 today, 2u TID with meals, and continuing him on sliding scale insulin -Semglee 18 units at night -Novolog 2u TID with meals -SSI  Dementia, likely vascular Malnutrition Delirium Patient with a history of not being able to perform ADLs, or IADLs.  Presented to clinic with medication noncompliance, multiple falls, and poor poor p.o. intake.  MRI brain from August 2022 showed multiple lacunar infarcts in the right thalamus, left basal ganglia, right cerebellum, and left medulla.  Patient's mentation has continued to deteriorate since then.  Now with evidence of declining executive functioning and short and long-term memory.  I have tried to test with MoCA but patient has declined x3.  Overnight patient was confused and altered with nurses, concerning for dementia-related behavioral disturbance vs  hospital-associated delirium.  Patient deemed not safe to return home by physical therapy, now awaiting SNF placement -Delirium precautions -Continue to reorient -Patient now transition to Glucerna shake 3 times daily for moderate malnutrition.  Hypertension Previously holding anti-hypertensive medication as patient presented with hypotension. SBPs now 130-150. It is unclear whether patient was taking medications at home. If SPBs continue to be in this range or higher, will restart half dose of home amlodipine.  Atrial Fibrillation Pt with history of A-fib, anticoagulated on Eliquis. Currently in regular rhythm.  -Continue Eliquis   Cirrhosis  Pt with prior RUQ UKoreain 2021 that showed cirrhotic hepatic morphology without evidence of focal lesion.  Patient also with a history of leukopenia and  low platelets.  No fluid wave on exam.    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  Patient was previously treated with Valtrex.  No symptoms today -Continue Valtrex    Deconditioning SNF placement Physical therapy recommends SNF placement.  Case management following for appropriate SNF placement given patient's insurance. - Consult TOC and case management   Diet: Carb Modified VTE: Eliquis Bowel: Senna Code: Full  Romana Juniper, MD Internal Medicine Resident PGY-1 Please contact the on call pager after 5 pm and on weekends at [phone].pro gr

## 2021-09-24 ENCOUNTER — Inpatient Hospital Stay (HOSPITAL_COMMUNITY): Payer: Medicare Other

## 2021-09-24 DIAGNOSIS — E861 Hypovolemia: Secondary | ICD-10-CM | POA: Diagnosis not present

## 2021-09-24 DIAGNOSIS — G3184 Mild cognitive impairment, so stated: Secondary | ICD-10-CM | POA: Diagnosis not present

## 2021-09-24 DIAGNOSIS — E440 Moderate protein-calorie malnutrition: Secondary | ICD-10-CM | POA: Diagnosis not present

## 2021-09-24 DIAGNOSIS — I9589 Other hypotension: Secondary | ICD-10-CM | POA: Diagnosis not present

## 2021-09-24 LAB — URINALYSIS, ROUTINE W REFLEX MICROSCOPIC
Bacteria, UA: NONE SEEN
Bilirubin Urine: NEGATIVE
Glucose, UA: >=500 mg/dL — AB
Hgb urine dipstick: NEGATIVE
Ketones, ur: 5 mg/dL — AB
Leukocytes,Ua: NEGATIVE
Nitrite: NEGATIVE
Protein, ur: 30 mg/dL — AB
Specific Gravity, Urine: 1.027 (ref 1.005–1.030)
pH: 7 (ref 5.0–8.0)

## 2021-09-24 LAB — CBC
HCT: 33.1 % — ABNORMAL LOW (ref 39.0–52.0)
Hemoglobin: 11.8 g/dL — ABNORMAL LOW (ref 13.0–17.0)
MCH: 31.4 pg (ref 26.0–34.0)
MCHC: 35.6 g/dL (ref 30.0–36.0)
MCV: 88 fL (ref 80.0–100.0)
Platelets: 152 10*3/uL (ref 150–400)
RBC: 3.76 MIL/uL — ABNORMAL LOW (ref 4.22–5.81)
RDW: 13.2 % (ref 11.5–15.5)
WBC: 2.6 10*3/uL — ABNORMAL LOW (ref 4.0–10.5)
nRBC: 0 % (ref 0.0–0.2)

## 2021-09-24 LAB — BASIC METABOLIC PANEL
Anion gap: 7 (ref 5–15)
BUN: 13 mg/dL (ref 8–23)
CO2: 26 mmol/L (ref 22–32)
Calcium: 8.7 mg/dL — ABNORMAL LOW (ref 8.9–10.3)
Chloride: 101 mmol/L (ref 98–111)
Creatinine, Ser: 0.96 mg/dL (ref 0.61–1.24)
GFR, Estimated: >60 mL/min (ref >60)
Glucose, Bld: 355 mg/dL — ABNORMAL HIGH (ref 70–99)
Potassium: 3.9 mmol/L (ref 3.5–5.1)
Sodium: 134 mmol/L — ABNORMAL LOW (ref 135–145)

## 2021-09-24 LAB — GLUCOSE, CAPILLARY
Glucose-Capillary: 272 mg/dL — ABNORMAL HIGH (ref 70–99)
Glucose-Capillary: 259 mg/dL — ABNORMAL HIGH (ref 70–99)
Glucose-Capillary: 306 mg/dL — ABNORMAL HIGH (ref 70–99)
Glucose-Capillary: 276 mg/dL — ABNORMAL HIGH (ref 70–99)
Glucose-Capillary: 289 mg/dL — ABNORMAL HIGH (ref 70–99)

## 2021-09-24 MED ORDER — VANCOMYCIN HCL 1750 MG/350ML IV SOLN
1750.0000 mg | INTRAVENOUS | Status: DC
  Administered 2021-09-24 – 2021-09-25 (×2): 1750 mg via INTRAVENOUS
  Filled 2021-09-24 (×3): qty 350

## 2021-09-24 MED ORDER — IOHEXOL 300 MG/ML  SOLN
100.0000 mL | Freq: Once | INTRAMUSCULAR | Status: AC | PRN
  Administered 2021-09-24: 100 mL via INTRAVENOUS

## 2021-09-24 MED ORDER — INSULIN GLARGINE-YFGN 100 UNIT/ML ~~LOC~~ SOLN
25.0000 [IU] | Freq: Every day | SUBCUTANEOUS | Status: DC
  Administered 2021-09-24 – 2021-09-25 (×2): 25 [IU] via SUBCUTANEOUS
  Filled 2021-09-24 (×4): qty 0.25

## 2021-09-24 MED ORDER — ACETAMINOPHEN 325 MG PO TABS
650.0000 mg | ORAL_TABLET | Freq: Four times a day (QID) | ORAL | Status: DC | PRN
  Administered 2021-09-24: 650 mg via ORAL
  Filled 2021-09-24: qty 2

## 2021-09-24 MED ORDER — CEFEPIME HCL 2 G IV SOLR
2.0000 g | Freq: Three times a day (TID) | INTRAVENOUS | Status: DC
  Administered 2021-09-25 – 2021-09-26 (×5): 2 g via INTRAVENOUS
  Filled 2021-09-24 (×5): qty 12.5

## 2021-09-24 NOTE — Progress Notes (Signed)
Pharmacy Antibiotic Note  Mark Cabrera is a 71 y.o. male admitted on 09/21/2021 with  fever of unknown origin .  Pharmacy has been consulted for Vancomycin and cefepime dosing.  SCr was 0.96, WBC 2.6, temp 102.1. Unclear etiology of the fever. PCN allergy with unknown reaction but has tolerated ceftriaxone in the past (11/08/2020)  Plan: Vancomycin 1750 IV every 24 hours; Estimated AUC 497.5 Cefepime 2 grams every 8 hours  Height: '5\' 8"'$  (172.7 cm) Weight: 82.2 kg (181 lb 3.2 oz) IBW/kg (Calculated) : 68.4  Temp (24hrs), Avg:100.7 F (38.2 C), Min:98.6 F (37 C), Max:102.1 F (38.9 C)  Recent Labs  Lab 09/21/21 1600 09/21/21 2143 09/22/21 0307 09/22/21 0944 09/22/21 1451 09/22/21 1930 09/22/21 2318 09/23/21 0312 09/24/21 0355  WBC 2.6*  --  2.4*  --   --   --   --  2.3* 2.6*  CREATININE  --  1.08 0.91 0.98  --   --   --  0.95 0.96  LATICACIDVEN  --   --   --   --  2.7* 2.2* 2.8* 1.5  --     Estimated Creatinine Clearance: 71.6 mL/min (by C-G formula based on SCr of 0.96 mg/dL).    Allergies  Allergen Reactions   Morpholine Salicylate Other (See Comments)    Hallucinations   Penicillins Other (See Comments)    Allergic- reaction?? Has patient had a PCN reaction causing immediate rash, facial/tongue/throat swelling, SOB or lightheadedness with hypotension: Unk Has patient had a PCN reaction causing severe rash involving mucus membranes or skin necrosis: Unk Has patient had a PCN reaction that required hospitalization: Unk Has patient had a PCN reaction occurring within the last 10 years: No If all of the above answers are "NO", then may proceed with Cephalosporin use.  Other reaction(s): Unknown   Morphine And Related Other (See Comments)    Hallucinations    Sglt2 Inhibitors Rash and Other (See Comments)    Candidiasis infection prone   Statins Rash and Other (See Comments)    Severe rash and back pain, and muscle pain    Antimicrobials this admission: 7/15  Vancomycin >>  7/15 cefepime >>    Microbiology results: 7/15 BCx: NGTD  Thank you for allowing pharmacy to be a part of this patient's care.  Sandford Craze 09/24/2021 9:33 PM

## 2021-09-24 NOTE — TOC Progression Note (Signed)
Transition of Care [facility]) - Progression Note    Patient Details  Name: Mark Cabrera MRN: [id number] Date of Birth: 1950/05/09  Transition of Care [facility]) CM/SW [place], LCSW Phone Number:[phone] 09/24/2021, 12:47 PM  Clinical Narrative:    CSW attempted to call the admission staff at [facility] to check on acceptance and it is going straight to [facility] CSW has left a message. No new bed offers have come back.  TOC team will continue to assist with discharge planning needs.    Expected Discharge Plan: [place] Barriers to Discharge: Continued Medical Work up, SNF Pending bed offer  Expected Discharge Plan and Services Expected Discharge Plan: [place] arrangements for the past 2 months: Single Family Home                                       Social Determinants of Health (SDOH) Interventions    Readmission Risk Interventions     No data to display

## 2021-09-24 NOTE — Progress Notes (Signed)
[name] is a 71 year old with past medical history of diabetes, compensated cirrhosis who presented with hypotension.  Patient noted to have recurrent fever up to 102 Fahrenheit.  He denies chills, but his daughter has noted that he is talking less and appears to have less energy from when she visited last night.  He denies headache, chest pain, and abdominal pain.  Last bowel movement was several days ago.  He denies dysuria or increased frequency.  O: Temp 102 F, BP 154/85 HR 97 Constitutional: well-appearing, in no acute distress Cardiovascular: regular rate and rhythm, no m/r/g Pulmonary/Chest: normal work of breathing on room air, no wheezing or rhonchi appreciated Abdominal: soft, non-tender, non-distended MSK: normal bulk and tone Neurological: alert & oriented x 3 Skin: warm and dry A/P: Initiate empiric antibiotic coverage with vancomycin and cefepime.  Of note patient has penicillin allergy listed, his reaction was rash with throat swelling several years ago.  He did take cephalosporin about a year ago and states that he did well with this medication and did not have rash.  Patient does have a history of compensated cirrhosis.  Will pursue CT abdomen for further evaluation.

## 2021-09-24 NOTE — Progress Notes (Addendum)
Hospital day#2  Subjective:    Overnight Events: No acute overnight events.  Patient is sleepy this morning, not engaging much in conversation. States he feels puny, but not worse than yesterday. Wants to have some sweet tea. No complaints or concerns at this time.   Objective:  Vital signs in last 24 hours: Vitals:   09/23/21 2013 09/24/21 0558 09/24/21 0746 09/24/21 1105  BP:  (!) 157/81 138/76 138/72  Pulse:  97 95 95  Resp:  '16 18 18  '$ Temp:  99.8 F (37.7 C) (!) 100.9 F (38.3 C) (!) 101.7 F (38.7 C)  TempSrc:  Oral Oral Oral  SpO2: 100% 99% 99%   Weight:  82.2 kg    Height:       Supplemental O2: Nasal Cannula SpO2: 99 % O2 Flow Rate (L/min): 2 L/min Filed Weights   09/22/21 0600 09/23/21 0445 09/24/21 0558  Weight: 85.5 kg 85.5 kg 82.2 kg    Physical Exam:  General: chronically ill-appearing elderly male, laying in bed, NAD. CV: normal rate and regular rhythm, no m/r/g. Pulm: CTABL, normal WOB on RA. Abdomen: soft, nondistended, nontender, normoactive BS. MSK: no peripheral edema. Skin: warm and dry. Psych: appropriate mood and affect.    Intake/Output Summary (Last 24 hours) at 09/24/2021 1250 Last data filed at 09/24/2021 1222 Gross per 24 hour  Intake 1210 ml  Output 4275 ml  Net -3065 ml   Net IO Since Admission: -5,161 mL [09/24/21 1250]  Pertinent Labs:    Latest Ref Rng & Units 09/24/2021    3:55 AM 09/23/2021    3:12 AM 09/22/2021    3:07 AM  CBC  WBC 4.0 - 10.5 K/uL 2.6  2.3  2.4   Hemoglobin 13.0 - 17.0 g/dL 11.8  10.7  9.7   Hematocrit 39.0 - 52.0 % 33.1  30.3  27.4   Platelets 150 - 400 K/uL 152  145  155        Latest Ref Rng & Units 09/24/2021    3:55 AM 09/23/2021    3:12 AM 09/22/2021    9:44 AM  CMP  Glucose 70 - 99 mg/dL 355  254  248   BUN 8 - 23 mg/dL '13  15  12   '$ Creatinine 0.61 - 1.24 mg/dL 0.96  0.95  0.98   Sodium 135 - 145 mmol/L 134  137  136   Potassium 3.5 - 5.1 mmol/L 3.9  3.6  3.5   Chloride 98 - 111  mmol/L 101  103  104   CO2 22 - 32 mmol/L '26  23  26   '$ Calcium 8.9 - 10.3 mg/dL 8.7  9.0  9.2     Imaging: DG CHEST PORT 1 VIEW  Result Date: 09/24/2021 CLINICAL DATA:  Hypoxia. EXAM: PORTABLE CHEST 1 VIEW COMPARISON:  One-view chest x-ray 09/23/2021 FINDINGS: Heart is enlarged. No edema or effusion is present. Mild interstitial prominence is stable. Cardiac surgery is noted. Aortic atherosclerosis noted. IMPRESSION: 1. Cardiomegaly without failure. 2. Stable interstitial prominence. Electronically Signed   By: San Morelle M.D.   On: 09/24/2021 12:45    Assessment/Plan:   Principal Problem:   Hypotension Active Problems:   Diabetes mellitus with neurological manifestation (HCC)   Chronic diastolic heart failure (HCC)   Paroxysmal atrial fibrillation (HCC)   Physical deconditioning   Symptomatic hypotension   Malnutrition of moderate degree   Patient Summary: Mark Cabrera is a 71 y.o. with a pertinent PMH of DM, HLD,  HEENT, heart failure, CAD status post CABG, AFib, sleep apnea, compensated cirrhosis, and multiple CVAs admitted for hypotension, now being managed for uncontrolled diabetes and fluid overload.  Fever Patient spike a fever this morning of 100.55F with subsequent temp of 101.45F. Unclear etiology at this time. UA on admission was negative and CXR showed mild pulmonary edema. He did have a lactic acidosis but this has since cleared. Will obtain repeat UA to ensure no new infection. CXR today showing stable pulmonary edema, unchanged from yesterday. Will obtain blood cultures and provide tylenol once to see if fever resolves. Given uncontrolled T2DM, if repeat fever, will need to initiate empiric antibiotics until cultures result. -f/u UA -tylenol once -f/u blood cultures -if febrile again, initiate empiric antibiotics with vanc/cefepime until cultures result  HFpEF 09/23/2021 echo showed LV EF 50 to 55%, no RWMA. Not feeling SHOB now, currently satting well on 2L  [name]. He had 4.6L UOP yesterday on his home lasix dose, with decrease in weight from 85.5kg > 82.2kg. Will continue home lasix today as well. -continue PO lasix '40mg'$  daily -Weight daily -Strict I/O's  Insulin-dependent DMT2 Patient's A1c greater than 14%.  Fasting CBG today 272.  -increased semglee to 25u qhs -Novolog 2u TID with meals -moderate SSI  Dementia, likely vascular Malnutrition Delirium Patient with a history of not being able to perform ADLs, or IADLs.  Presented to clinic with medication noncompliance, multiple falls, and poor poor p.o. intake.  MRI brain from August 2022 showed multiple lacunar infarcts in the right thalamus, left basal ganglia, right cerebellum, and left medulla.  Patient's mentation has continued to deteriorate since then.  Now with evidence of declining executive functioning and short and long-term memory.  We have tried to test with MoCA but patient has declined x3.  Concern for dementia-related behavioral disturbance vs hospital-associated delirium.  Patient deemed not safe to return home by physical therapy, now awaiting SNF placement. -Delirium precautions -reorient as able -continue Glucerna shake 3 times daily for moderate malnutrition -appreciate TOC assistance with SNF placement  Hypertension Previously holding anti-hypertensive medication as patient presented with hypotension. SBPs now 130-150. It is unclear whether patient was taking medications at home.  -continue to monitor  Atrial Fibrillation Pt with history of A-fib, anticoagulated on Eliquis. Currently in regular rhythm.  -Continue Eliquis   Cirrhosis  Pt with prior RUQ [place] in 2021 that showed cirrhotic hepatic morphology without evidence of focal lesion.  Patient also with a history of leukopenia and low platelets.  No fluid wave on exam.    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  Patient was previously treated with Valtrex.  No symptoms  today -Continue Valtrex    Deconditioning SNF placement Physical therapy recommends SNF placement.  Case management following for appropriate SNF placement given patient's insurance. - appreciate TOC assistance  Diet: Carb Modified VTE: Eliquis Bowel: Senna Code: Full  Virl Axe, MD Internal Medicine Resident PGY-3 Please contact the on call pager after 5 pm and on weekends at [phone].pro gr

## 2021-09-24 NOTE — Progress Notes (Signed)
   09/24/21 2000  Assess: MEWS Score  Temp (!) 101.9 F (38.8 C)  BP (!) 154/85  Pulse Rate (!) 101  Resp 18  SpO2 100 %  O2 Device Nasal Cannula  Assess: MEWS Score  MEWS Temp 2  MEWS Systolic 0  MEWS Pulse 1  MEWS RR 0  MEWS LOC 0  MEWS Score 3  MEWS Score Color Yellow  Assess: if the MEWS score is Yellow or Red  Were vital signs taken at a resting state? Yes  Focused Assessment Change from prior assessment (see assessment flowsheet)  Does the patient meet 2 or more of the SIRS criteria? Yes  Does the patient have a confirmed or suspected source of infection? No  MEWS guidelines implemented *See Row Information* No, other (Comment) (pt has been having a fever off and on today, MD is aware)  Document  Patient Outcome Stabilized after interventions  Progress note created (see row info) Yes  Assess: SIRS CRITERIA  SIRS Temperature  1  SIRS Pulse 1  SIRS Respirations  0  SIRS WBC 0  SIRS Score Sum  2

## 2021-09-25 DIAGNOSIS — B999 Unspecified infectious disease: Secondary | ICD-10-CM | POA: Diagnosis not present

## 2021-09-25 DIAGNOSIS — R509 Fever, unspecified: Principal | ICD-10-CM | POA: Insufficient documentation

## 2021-09-25 LAB — BASIC METABOLIC PANEL
Anion gap: 14 (ref 5–15)
BUN: 13 mg/dL (ref 8–23)
CO2: 21 mmol/L — ABNORMAL LOW (ref 22–32)
Calcium: 8.7 mg/dL — ABNORMAL LOW (ref 8.9–10.3)
Chloride: 98 mmol/L (ref 98–111)
Creatinine, Ser: 0.98 mg/dL (ref 0.61–1.24)
GFR, Estimated: >60 mL/min (ref >60)
Glucose, Bld: 275 mg/dL — ABNORMAL HIGH (ref 70–99)
Potassium: 3.6 mmol/L (ref 3.5–5.1)
Sodium: 133 mmol/L — ABNORMAL LOW (ref 135–145)

## 2021-09-25 LAB — CBC
HCT: 32.5 % — ABNORMAL LOW (ref 39.0–52.0)
Hemoglobin: 11.8 g/dL — ABNORMAL LOW (ref 13.0–17.0)
MCH: 31.4 pg (ref 26.0–34.0)
MCHC: 36.3 g/dL — ABNORMAL HIGH (ref 30.0–36.0)
MCV: 86.4 fL (ref 80.0–100.0)
Platelets: 161 10*3/uL (ref 150–400)
RBC: 3.76 MIL/uL — ABNORMAL LOW (ref 4.22–5.81)
RDW: 13.4 % (ref 11.5–15.5)
WBC: 2.9 10*3/uL — ABNORMAL LOW (ref 4.0–10.5)
nRBC: 0 % (ref 0.0–0.2)

## 2021-09-25 LAB — GLUCOSE, CAPILLARY
Glucose-Capillary: 329 mg/dL — ABNORMAL HIGH (ref 70–99)
Glucose-Capillary: 306 mg/dL — ABNORMAL HIGH (ref 70–99)
Glucose-Capillary: 212 mg/dL — ABNORMAL HIGH (ref 70–99)
Glucose-Capillary: 293 mg/dL — ABNORMAL HIGH (ref 70–99)
Glucose-Capillary: 275 mg/dL — ABNORMAL HIGH (ref 70–99)

## 2021-09-25 LAB — GROUP A STREP BY PCR: Group A Strep by PCR: NOT DETECTED

## 2021-09-25 LAB — RPR: RPR Ser Ql: NONREACTIVE

## 2021-09-25 MED ORDER — ACETAMINOPHEN 325 MG PO TABS: 650.0000 mg | ORAL_TABLET | Freq: Four times a day (QID) | ORAL | Status: DC | PRN

## 2021-09-25 MED ORDER — CLOTRIMAZOLE 1 % EX SOLN
Freq: Two times a day (BID) | CUTANEOUS | Status: DC
Start: 2021-09-25 — End: 2021-09-25
  Filled 2021-09-25 (×2): qty 10

## 2021-09-25 MED ORDER — CLOTRIMAZOLE 1 % EX CREA
TOPICAL_CREAM | Freq: Two times a day (BID) | CUTANEOUS | Status: DC
Start: 2021-09-25 — End: 2021-09-27
  Filled 2021-09-25: qty 15

## 2021-09-25 MED ORDER — SENNOSIDES-DOCUSATE SODIUM 8.6-50 MG PO TABS
2.0000 | ORAL_TABLET | Freq: Every day | ORAL | Status: DC
  Filled 2021-09-25: qty 2

## 2021-09-25 MED ORDER — POLYETHYLENE GLYCOL 3350 17 G PO PACK
17.0000 g | PACK | Freq: Every day | ORAL | Status: DC
  Administered 2021-09-25 – 2021-09-28 (×4): 17 g via ORAL
  Filled 2021-09-25 (×4): qty 1

## 2021-09-25 MED ORDER — FLUCONAZOLE 150 MG PO TABS
150.0000 mg | ORAL_TABLET | Freq: Every day | ORAL | Status: AC
  Administered 2021-09-25 – 2021-09-27 (×3): 150 mg via ORAL
  Filled 2021-09-25 (×3): qty 1

## 2021-09-25 MED ORDER — ACETAMINOPHEN 325 MG PO TABS
650.0000 mg | ORAL_TABLET | Freq: Four times a day (QID) | ORAL | Status: AC | PRN
  Administered 2021-09-25: 650 mg via ORAL
  Filled 2021-09-25: qty 2

## 2021-09-25 NOTE — Progress Notes (Addendum)
Subjective:  Pt reports feeling weak and tired today. He has not been able to move around as much due to the malaise. He is feeling worse today than yesterday. He has not been eating as much. He reports worsening throat pain. He also reports soreness to penis. He denies any recent BM. He denies any cough, chest pain, abdominal pain, ear pain, or SOB.    Objective:  Vital signs in last 24 hours: Vitals:   09/24/21 2000 09/24/21 2048 09/24/21 2340 09/25/21 0510  BP: (!) 154/85   (!) 143/70  Pulse: (!) 101 97 91 90  Resp: '18  18 18  '$ Temp: (!) 101.9 F (38.8 C) (!) 102.1 F (38.9 C) 99.5 F (37.5 C) 98.9 F (37.2 C)  TempSrc: Oral Oral Oral Oral  SpO2: 100%  96% 96%  Weight:    82 kg  Height:       Weight change: -0.182 kg  Intake/Output Summary (Last 24 hours) at 09/25/2021 1245 Last data filed at 09/25/2021 0110 Gross per 24 hour  Intake 240 ml  Output 1475 ml  Net -1235 ml   Physical Exam:  General: chronically ill-appearing elderly male, laying in bed, NAD. HENT: Oropharynx with diffuse erythema with scattered flat red petechiae. No lymphadenopathy but tender to palpation. Tonsillectomy. No exudates.  CV: normal rate and regular rhythm, no m/r/g. Pulm: CTABL, normal WOB on RA. Abdomen: soft, nondistended, nontender, normoactive BS. GU: White plaques and erythema to foreskin and glans of penis easily scraped off. Non tender on exam. No ulcers or chancres. No discharge. MSK: no peripheral edema. Skin: warm and dry. Psych: appropriate mood and affect.   Assessment/Plan:  Principal Problem:   Hypotension Active Problems:   Diabetes mellitus with neurological manifestation (HCC)   Chronic diastolic heart failure (HCC)   Paroxysmal atrial fibrillation (HCC)   Physical deconditioning   Symptomatic hypotension   Malnutrition of moderate degree   Patient Summary: Mark Cabrera is a 71 y.o. with a pertinent PMH of DM, HLD, HEENT, heart failure, CAD status post CABG,  AFib, sleep apnea, compensated cirrhosis, and multiple CVAs admitted for hypotension and uncontrolled DM, currently under going acute sepsis work up.   Fever, unclear etiology Patient spiked a fever this yesterday of 101.78F. Unclear etiology at this time. He did have a lactic acidosis but this has since cleared. Repeat UA without signs of infection, CT concerning for possible cystitis. CXR showing stable pulmonary edema, unchanged from admission. Will obtain blood cultures and provide Tylenol. Given repeat fever overnight, initiated empiric antibiotics with Vancomycin and Cefepime until cultures result. CT abdomen pelvis, without acute source of infection showing stool burden will increase bowel regimen. 60m noncalcified right lower lobe nodule in an area of post pneumonic scarring, recommend f/u repeat imaging in 3-4 months. Pt's oropharynx with erythema and tender lymph nodes without cough concern for strep vs viral source. Given [facility] criteria pt qualifies for rapid strep testing currently with gram+ antibiotic coverage. Pt also with white plaques to glans of penis concerning for candidiasis vs STD. WBC 2.9 -tylenol for antipyretic  -f/u blood cultures -vanc/cefepime until cultures result -f/u rapid strep test - Awaiting G/C Chlamydia, RPR, and HIV screening - Vital signs q4h   HFpEF 09/23/2021 echo showed LV EF 50 to 55%, no RWMA. On his home lasix dose, with decrease in weight from 85.5kg > 82kg. -continue PO lasix '40mg'$  daily -Weight daily -Strict I/O's   Insulin-dependent DMT2 Patient's A1c greater than 14%.  Fasting CBG today  329. Will not increase DM regimen today given fevers and possible infection -Semglee to 25u qhs -Novolog 2u TID with meals -moderate SSI   Dementia, likely vascular Malnutrition Delirium Patient with a history of not being able to perform ADLs, or IADLs. Presented to clinic with medication noncompliance, multiple falls, and poor p.o. intake. MRI brain from  August 2022 showed multiple lacunar infarcts in the right thalamus, left basal ganglia, right cerebellum, and left medulla. Patient's mentation has continued to deteriorate since then. Now with evidence of declining executive functioning and short and long-term memory. We have tried to test with MoCA but patient has declined x3. Concern for dementia-related behavioral disturbance vs hospital-associated delirium. Patient deemed not safe to return home by physical therapy, now awaiting SNF placement. -Delirium precautions -reorient as able -continue Glucerna shake 3 times daily for moderate malnutrition -appreciate TOC assistance with SNF placement   Hypertension Previously holding anti-hypertensive medication as patient presented with hypotension. SBPs now 130-150. It is unclear whether patient was taking medications at home.  -continue to monitor   Atrial Fibrillation Pt with history of A-fib, anticoagulated on Eliquis. Currently in regular rhythm.  -Continue Eliquis   Cirrhosis  Pt with prior RUQ [place] in 2021 that showed cirrhotic hepatic morphology without evidence of focal lesion.  Patient also with a history of leukopenia and low platelets.  No fluid wave on exam.    HSV2 Balanitis  Pt with easily removed white plaques to foreskin and glans of penis. Concerning for candidiasis vs STD. -Continue Valtrex  - G/C Chlamydia, RPR, and HIV screening - Diflucan '150mg'$  for 3 days with topical lotrimin BID   Deconditioning SNF placement Physical therapy recommends SNF placement.  Case management following for appropriate SNF placement given patient's insurance. - appreciate TOC assistance   Diet: Carb Modified VTE: Eliquis Bowel: Senna and Miralax Code: Full   LOS: 3 days   Mark Cabrera, Medical Student 09/25/2021, 6:33 AM   Attestation for Student Documentation:  I personally was present and performed or re-performed the history, physical exam and medical decision-making  activities of this service and have verified that the service and findings are accurately documented in the student's note.  Mark Axe, MD 09/25/2021, 4:56 PM

## 2021-09-25 NOTE — Evaluation (Signed)
Clinical/Bedside Swallow Evaluation Patient Details  Name: ODESSA MORREN MRN: [id number] Date of Birth: [birth year]  Today's Date: 09/25/2021 Time: SLP Start Time (ACUTE ONLY): 27 SLP Stop Time (ACUTE ONLY): 7902 SLP Time Calculation (min) (ACUTE ONLY): 13 min  Past Medical History:  Past Medical History:  Diagnosis Date   Acute diastolic congestive heart failure (HCC)    AKI (acute kidney injury) ([place])    Aortic valve stenosis s/p AVR 2018   Echo 2/22: Poor acoustic windows, EF 50-55, mild LVH, normal RVSF, mild MR, trivial AI, no AS, no pericardial effusion   Atrial fibrillation (HCC) - post-op CABG    04/2016 CHA2DS2VAS score = 5   Atypical nevi    Coronary artery disease s/p 2 vessel CABG    Depression    "years ago"   Diabetes mellitus    Dyspnea    in the past    GERD (gastroesophageal reflux disease)    Heart murmur    Hepatic cirrhosis ([place]) 06/29/2017   History of kidney stones    Hyperlipidemia    hx of transaminitis secondary to statin and he has decided not to use statins secondary to potential side effects.   Hypertension    Nephrolithiasis    Osteoarthritis, knee    Pericardial effusion a. subxiphoid pericardial window on 04/21/2016 04/28/2016   Peripheral neuropathic pain 05/12/2016   Sleep apnea     Central apnea. 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 2-D echo was normal no embolic source found.   Past Surgical History:  Past Surgical History:  Procedure Laterality Date   AORTIC VALVE REPLACEMENT N/A 03/30/2016   Procedure: AORTIC VALVE REPLACEMENT (AVR);  Surgeon: [name], MD;  Location: [place];   Service: Open Heart Surgery;  Laterality: N/A;   CARDIAC CATHETERIZATION N/A 03/15/2016   Procedure: Right/Left Heart Cath and Coronary Angiography;  Surgeon: Burnell Blanks, MD;  Location: [place] [facility];  Service: Cardiovascular;  Laterality: N/A;   CLIPPING OF ATRIAL APPENDAGE N/A 03/30/2016   Procedure: CLIPPING OF LEFT ATRIAL APPENDAGE;  Surgeon: [name], MD;  Location: [place];  Service: Open Heart Surgery;  Laterality: N/A;   CORONARY ARTERY BYPASS GRAFT N/A 03/30/2016   Procedure: CORONARY ARTERY BYPASS GRAFTING (CABG) Times Two;  Surgeon: [name], MD;  Location: [place];  Service: Open Heart Surgery;  Laterality: N/A;   KNEE ARTHROSCOPY W/ PARTIAL MEDIAL MENISCECTOMY  05/12/2005   right, performed by Dr. French Ana for torn medial meniscus.   ROTATOR CUFF REPAIR Right 2016   STERNAL WIRES REMOVAL N/A 08/13/2017   Procedure: STERNAL WIRES REMOVAL;  Surgeon: [name], MD;  Location: [place];  Service: Thoracic;  Laterality: N/A;   SUBXYPHOID PERICARDIAL WINDOW N/A 04/21/2016   Procedure: SUBXYPHOID PERICARDIAL WINDOW;  Surgeon: [name], MD;  Location: [place];  Service: Thoracic;  Laterality: N/A;   TEE WITHOUT CARDIOVERSION N/A 03/30/2016   Procedure: TRANSESOPHAGEAL ECHOCARDIOGRAM (TEE);  Surgeon: [name], MD;  Location: [place];  Service: Open Heart Surgery;  Laterality: N/A;   TEE WITHOUT CARDIOVERSION N/A 04/21/2016   Procedure: TRANSESOPHAGEAL ECHOCARDIOGRAM (TEE);  Surgeon: [name], MD;  Location[facility];  Service: Thoracic;  Laterality: N/A;   HPI:  Pt is a 71 year old male who presented with 2 weeks of dizziness and malaise. Pt found to be hypotensive and with elevated A1c. PMH: AKI, COVID-19, CHF, Afib, CAD s/p CABG, DM, GERD, HLD, HTN, dementia (vascular suspected), progressive cognitive decline for several years.    Assessment / Plan / Recommendation  Clinical Impression  Pt was seen for bedside swallow  evaluation and he denied a history of dysphagia. Pt reported some difficulty with breakfast. Per pt's RN, pt was coughing during the meal, but was subsequently asymptomatic with pills (taken whole with thin liquids). Coughing and hiccups were noted frequently at baseline and intermittently during and after the evaluation. Oral mechanism exam was [employer]; dentition was reduced. Pt exhibited prolonged and inconsistent mastication as well as delayed coughing with with regular textures. His swallow mechanism otherwise appeared functional. Due to pt's baseline coughing, the possibility of it being unrelated to swallowing is considered. A dysphagia 3 diet with thin liquids is recommended at this time. SLP will follow to ensure tolerance and for possible instrumental assessment. SLP Visit Diagnosis: Dysphagia, unspecified (R13.10)    Aspiration Risk  Mild aspiration risk    Diet Recommendation Dysphagia 3 (Mech soft);Thin liquid   Liquid Administration via: Cup;Straw Medication Administration: Whole meds with liquid Supervision: Patient able to self feed Compensations: Slow rate;Small sips/bites;Minimize environmental distractions Postural Changes: Seated upright at 90 degrees    Other  Recommendations Oral Care Recommendations: Oral care BID    Recommendations for follow up therapy are one component of a multi-disciplinary discharge planning process, led by the attending physician.  Recommendations may be updated based on patient status, additional functional criteria and insurance authorization.  Follow up Recommendations  (TBD)      Assistance Recommended at Discharge Frequent or constant Supervision/Assistance  Functional Status Assessment Patient has had a recent decline in their functional status and demonstrates the ability to make significant improvements in function in a reasonable and predictable amount of time.  Frequency and Duration min 2x/week  2 weeks       Prognosis Prognosis for  Safe Diet Advancement: Good Barriers to Reach Goals: Cognitive deficits      Swallow Study   General Date of Onset: 09/24/21 HPI: Pt is a 71 year old male who presented with 2 weeks of dizziness and malaise. Pt found to be hypotensive and with elevated A1c. PMH: AKI, COVID-19, CHF, Afib, CAD s/p CABG, DM, GERD, HLD, HTN, dementia (vascular suspected), progressive cognitive decline for several years. Type of Study: Bedside Swallow Evaluation Previous Swallow Assessment: none Diet Prior to this Study: Regular;Thin liquids Temperature Spikes Noted: No Respiratory Status: Nasal cannula History of Recent Intubation: No Behavior/Cognition: Alert;Cooperative;Pleasant mood Oral Cavity Assessment: Within Functional Limits Oral Care Completed by SLP: No Oral Cavity - Dentition: Missing dentition Vision: Functional for self-feeding Self-Feeding Abilities: Able to feed self Patient Positioning: Upright in bed;Postural control adequate for testing Baseline Vocal Quality: Normal Volitional Cough: Strong Volitional Swallow: Able to elicit    Oral/Motor/Sensory Function Overall Oral Motor/Sensory Function: Within functional limits   Ice Chips Ice chips: Not tested   Thin Liquid Thin Liquid: Within functional limits Presentation: Straw    Nectar Thick Nectar Thick Liquid: Not tested   Honey Thick Honey Thick Liquid: Not tested   Puree Puree: Within functional limits Presentation: Spoon   Solid     Solid: Impaired Presentation: Self Fed Oral  Phase Impairments: Impaired mastication Pharyngeal Phase Impairments: Cough - Delayed     Mariafernanda Hendricksen I. Hardin Negus, [place], [place] Office number [phone] Pager [phone]  Horton Marshall 09/25/2021,10:56 AM

## 2021-09-26 DIAGNOSIS — I9589 Other hypotension: Secondary | ICD-10-CM | POA: Diagnosis not present

## 2021-09-26 DIAGNOSIS — E1140 Type 2 diabetes mellitus with diabetic neuropathy, unspecified: Secondary | ICD-10-CM | POA: Diagnosis not present

## 2021-09-26 DIAGNOSIS — Z794 Long term (current) use of insulin: Secondary | ICD-10-CM

## 2021-09-26 DIAGNOSIS — E861 Hypovolemia: Secondary | ICD-10-CM | POA: Diagnosis not present

## 2021-09-26 DIAGNOSIS — B999 Unspecified infectious disease: Secondary | ICD-10-CM | POA: Diagnosis not present

## 2021-09-26 LAB — RESPIRATORY PANEL BY PCR

## 2021-09-26 LAB — DIFFERENTIAL
Abs Immature Granulocytes: 0.01 10*3/uL (ref 0.00–0.07)
Basophils Absolute: 0 10*3/uL (ref 0.0–0.1)
Basophils Relative: 1 %
Eosinophils Absolute: 0 10*3/uL (ref 0.0–0.5)
Eosinophils Relative: 0 %
Immature Granulocytes: 1 %
Lymphocytes Relative: 26 %
Lymphs Abs: 0.5 10*3/uL — ABNORMAL LOW (ref 0.7–4.0)
Monocytes Absolute: 0.3 10*3/uL (ref 0.1–1.0)
Monocytes Relative: 14 %
Neutro Abs: 1.3 10*3/uL — ABNORMAL LOW (ref 1.7–7.7)
Neutrophils Relative %: 58 %

## 2021-09-26 LAB — GLUCOSE, CAPILLARY
Glucose-Capillary: 323 mg/dL — ABNORMAL HIGH (ref 70–99)
Glucose-Capillary: 317 mg/dL — ABNORMAL HIGH (ref 70–99)
Glucose-Capillary: 329 mg/dL — ABNORMAL HIGH (ref 70–99)
Glucose-Capillary: 305 mg/dL — ABNORMAL HIGH (ref 70–99)

## 2021-09-26 LAB — COMPREHENSIVE METABOLIC PANEL
ALT: 28 U/L (ref 0–44)
AST: 32 U/L (ref 15–41)
Albumin: 3.1 g/dL — ABNORMAL LOW (ref 3.5–5.0)
Alkaline Phosphatase: 69 U/L (ref 38–126)
Anion gap: 12 (ref 5–15)
BUN: 17 mg/dL (ref 8–23)
CO2: 21 mmol/L — ABNORMAL LOW (ref 22–32)
Calcium: 8.5 mg/dL — ABNORMAL LOW (ref 8.9–10.3)
Chloride: 98 mmol/L (ref 98–111)
Creatinine, Ser: 1.05 mg/dL (ref 0.61–1.24)
GFR, Estimated: >60 mL/min (ref >60)
Glucose, Bld: 277 mg/dL — ABNORMAL HIGH (ref 70–99)
Potassium: 4 mmol/L (ref 3.5–5.1)
Sodium: 131 mmol/L — ABNORMAL LOW (ref 135–145)
Total Bilirubin: 1.5 mg/dL — ABNORMAL HIGH (ref 0.3–1.2)
Total Protein: 6.5 g/dL (ref 6.5–8.1)

## 2021-09-26 LAB — GC/CHLAMYDIA PROBE AMP (~~LOC~~) NOT AT ARMC
Chlamydia: NEGATIVE
Comment: NEGATIVE
Comment: NORMAL
Neisseria Gonorrhea: NEGATIVE

## 2021-09-26 LAB — CBC
HCT: 33 % — ABNORMAL LOW (ref 39.0–52.0)
Hemoglobin: 11.6 g/dL — ABNORMAL LOW (ref 13.0–17.0)
MCH: 31.4 pg (ref 26.0–34.0)
MCHC: 35.2 g/dL (ref 30.0–36.0)
MCV: 89.2 fL (ref 80.0–100.0)
Platelets: 148 10*3/uL — ABNORMAL LOW (ref 150–400)
RBC: 3.7 MIL/uL — ABNORMAL LOW (ref 4.22–5.81)
RDW: 13.3 % (ref 11.5–15.5)
WBC: 2.2 10*3/uL — ABNORMAL LOW (ref 4.0–10.5)
nRBC: 0 % (ref 0.0–0.2)

## 2021-09-26 LAB — URINE CYTOLOGY ANCILLARY ONLY
Chlamydia: NEGATIVE
Comment: NEGATIVE
Comment: NORMAL
Neisseria Gonorrhea: NEGATIVE

## 2021-09-26 MED ORDER — FUROSEMIDE 40 MG PO TABS
40.0000 mg | ORAL_TABLET | Freq: Every day | ORAL | Status: DC
Start: 2021-09-26 — End: 2021-09-26

## 2021-09-26 MED ORDER — INSULIN GLARGINE-YFGN 100 UNIT/ML ~~LOC~~ SOLN
30.0000 [IU] | Freq: Every day | SUBCUTANEOUS | Status: DC
  Administered 2021-09-26: 30 [IU] via SUBCUTANEOUS
  Filled 2021-09-26: qty 0.3

## 2021-09-26 MED ORDER — LACTULOSE 10 GM/15ML PO SOLN
20.0000 g | Freq: Two times a day (BID) | ORAL | Status: DC
  Administered 2021-09-26: 20 g via ORAL
  Filled 2021-09-26: qty 30

## 2021-09-26 MED ORDER — SENNOSIDES-DOCUSATE SODIUM 8.6-50 MG PO TABS
1.0000 | ORAL_TABLET | Freq: Every day | ORAL | Status: DC
  Administered 2021-09-26: 1 via ORAL
  Filled 2021-09-26: qty 1

## 2021-09-26 MED ORDER — ACETAMINOPHEN 325 MG PO TABS
650.0000 mg | ORAL_TABLET | Freq: Once | ORAL | Status: AC
  Administered 2021-09-26: 650 mg via ORAL
  Filled 2021-09-26: qty 2

## 2021-09-26 MED ORDER — LACTULOSE 10 GM/15ML PO SOLN
10.0000 g | Freq: Two times a day (BID) | ORAL | Status: DC
  Administered 2021-09-26: 10 g via ORAL
  Filled 2021-09-26: qty 15

## 2021-09-26 MED ORDER — INSULIN ASPART 100 UNIT/ML IJ SOLN
4.0000 [IU] | Freq: Three times a day (TID) | INTRAMUSCULAR | Status: DC
  Administered 2021-09-26 (×2): 4 [IU] via SUBCUTANEOUS

## 2021-09-26 NOTE — Progress Notes (Signed)
Inpatient Diabetes Program Recommendations  AACE/ADA: New Consensus Statement on Inpatient Glycemic Control (2015)  Target Ranges:  Prepandial:   less than 140 mg/dL      Peak postprandial:   less than 180 mg/dL (1-2 hours)      Critically ill patients:  140 - 180 mg/dL   Lab Results  Component Value Date   GLUCAP 317 (H) 09/26/2021   HGBA1C >14.0 09/21/2021    Review of Glycemic Control  Latest Reference Range & Units 09/25/21 11:14 09/25/21 16:19 09/25/21 20:44 09/25/21 22:38 09/26/21 06:32 09/26/21 12:11  Glucose-Capillary 70 - 99 mg/dL 306 (H) 212 (H) 293 (H) 275 (H) 323 (H) 317 (H)   Diabetes history: DM 2 Outpatient Diabetes medications: Soliqua 45 units q hs, Novolog 18 units tid, Metformin 500 mg bid Current orders for Inpatient glycemic control: Semglee 30 units qd, Novolog 0-15 units tid + 0-5 units hs correction, Novolog 4 units tid with meals   Inpatient Diabetes Program Recommendations:    May consider increasing Semglee to home basal dose of 45 units daily.  Also will likely need titration up of meal coverage based on home doses of insulin.    Thanks,  Adah Perl, RN, BC-ADM Inpatient Diabetes Coordinator Pager [phone]

## 2021-09-26 NOTE — Progress Notes (Signed)
Speech Language Pathology Treatment: Dysphagia  Patient Details Name: Mark Cabrera MRN: [id number] DOB: September 16, 1950 Today's Date: 09/26/2021 Time: 2703-5009 SLP Time Calculation (min) (ACUTE ONLY): 11 min  Assessment / Plan / Recommendation Clinical Impression  Mark Cabrera was sleepy but wakened easily. He consumed multiple, consecutive sips of water with adequate attention, the presence of a brisk swallow response, and no s/s of aspiration.  There was no coughing noted before or after PO consumption. His RN reported no difficulty with breakfast.  Will f/u with broader intake to ensure safety. At this time, no instrumental swallow study is recommended.   HPI HPI: Pt is a 71 year old male who presented with 2 weeks of dizziness and malaise. Pt found to be hypotensive and with elevated A1c. PMH: AKI, COVID-19, CHF, Afib, CAD s/p CABG, DM, GERD, HLD, HTN, dementia (vascular suspected), progressive cognitive decline for several years.      SLP Plan  Continue with current plan of care      Recommendations for follow up therapy are one component of a multi-disciplinary discharge planning process, led by the attending physician.  Recommendations may be updated based on patient status, additional functional criteria and insurance authorization.    Recommendations  Diet recommendations: Dysphagia 3 (mechanical soft);Thin liquid Liquids provided via: Cup;Straw Medication Administration: Whole meds with liquid Supervision: Staff to assist with self feeding Compensations: Slow rate;Small sips/bites;Minimize environmental distractions                Oral Care Recommendations: Oral care BID Follow Up Recommendations: No SLP follow up Assistance recommended at discharge: Frequent or constant Supervision/Assistance SLP Visit Diagnosis: Dysphagia, unspecified (R13.10) Plan: Continue with current plan of care         Mark Cabrera L. Tivis Ringer, MA CCC/SLP Clinical Specialist - Acute Care SLP Acute  Rehabilitation Services Office number [phone]   Mark Cabrera  09/26/2021, 11:08 AM

## 2021-09-26 NOTE — Progress Notes (Addendum)
Subjective:  He is not feeling well today. He is having some cough that is productive for sputum. He reports being in hospital is bothering him the most. He has been able to eat and drink fluids. He has not had a BM yet. He has not been moving around the room, he has been resting mostly.  Objective:  Vital signs in last 24 hours: Vitals:   09/25/21 1332 09/25/21 2025 09/26/21 0435 09/26/21 0600  BP:  111/70 122/67   Pulse:  73 92   Resp:  20 18   Temp:  98.3 F (36.8 C) 98.3 F (36.8 C)   TempSrc:  Oral Oral   SpO2: 100% 100% 97%   Weight:    83.5 kg  Height:       Weight change: 1.49 kg  Intake/Output Summary (Last 24 hours) at 09/26/2021 0704 Last data filed at 09/26/2021 0440 Gross per 24 hour  Intake 674 ml  Output 1000 ml  Net -326 ml   General: chronically ill-appearing elderly male, laying in bed, NAD. HENT: Oropharynx with diffuse erythema. No lymphadenopathy. Cervical tenderness to palpation. Tonsillectomy. No exudates.  CV: normal rate and regular rhythm, no m/r/g. Pulm: CTABL, normal WOB on RA. Abdomen: soft, nondistended, nontender, normoactive BS. MSK: no peripheral edema. Skin: warm and diaphoretic. Psych: appropriate mood and affect.  Assessment/Plan:  Principal Problem:   Hypotension Active Problems:   Diabetes mellitus with neurological manifestation (HCC)   Chronic diastolic heart failure (HCC)   Paroxysmal atrial fibrillation (HCC)   Physical deconditioning   Symptomatic hypotension   Malnutrition of moderate degree   Infection of uncertain etiology   Patient Summary: Mark Cabrera is a 71 y.o. with a pertinent PMH of DM, HLD, HEENT, heart failure, CAD status post CABG, AFib, sleep apnea, compensated cirrhosis, and multiple CVAs admitted for hypotension and uncontrolled DM, currently under going acute sepsis work up.   Fever, unclear etiology Patient has been fever free for 24 hours. Unclear etiology at this time less concerned for  bacterial source at this time. Will d/c vanc and cefepime. Repeat UA without signs of infection. CXR showing stable pulmonary edema, unchanged from admission. Blood cultures without growth day 2. CT abdomen pelvis, without acute source of infection showing stool burden will increase bowel regimen. Mark ascites seen on CT concern for SBP, no abdominal tenderness or fluid wave on exam. Exam without signs of an acute infection, higher suspicion for viral source given development of cough, malaise, and diaphoresis likely bronchitis vs pharyngitis. Giving lactulose for stool burden and possible hepatic encephalopathy, low suspicion given history of compensated cirrhosis, no fluid wave, and no asterixis. WBC 2.2 NEUT# 1.3. - tylenol for antipyretic - RVP - Lactulose 10g BID - f/u blood cultures day 2 - HIV screening pending   Chronic HFpEF 09/23/2021 echo showed LV EF 50 to 55%, no RWMA.  - discontinue PO lasix '40mg'$  daily given decreased PO intake -Weight daily -Strict I/O's   Insulin-dependent DMT2 Patient's A1c greater than 14%.  Fasting CBG today 277.  -Semglee to 30u qhs -Novolog 4u TID with meals, only administer if pt eats more than 50% of meals -moderate SSI   Dementia, likely vascular Malnutrition Delirium Patient with a history of not being able to perform ADLs, or IADLs. Presented to clinic with medication noncompliance, multiple falls, and poor p.o. intake. MRI brain from August 2022 showed multiple lacunar infarcts in the right thalamus, left basal ganglia, right cerebellum, and left medulla. Patient's mentation has continued  to deteriorate since then. Now with evidence of declining executive functioning and short and long-term memory. We have tried to test with MoCA but patient has declined x3. Concern for dementia-related behavioral disturbance vs hospital-associated delirium. Patient deemed not safe to return home by physical therapy, now awaiting SNF placement. -Delirium  precautions -reorient as able -continue Glucerna shake 3 times daily for moderate malnutrition -appreciate TOC assistance with SNF placement   Hypertension Holding anti-hypertensive medication as patient presented with hypotension. SBPs now 120s. It is unclear whether patient was taking medications at home.  -continue to monitor   Atrial Fibrillation Pt with history of A-fib, anticoagulated on Eliquis. Currently in regular rhythm.  -Continue Eliquis   Cirrhosis  Pt with prior RUQ [place] in 2021 that showed cirrhotic hepatic morphology without evidence of focal lesion. Patient also with a history of leukopenia and low platelets. No fluid wave or asterixis on exam. Pt more acutely altered yesterday. No history of decompensated cirrhosis  -Lactulose 10g BID for concern for hepatic encephalopathy   HSV2 Balanitis  Pt with easily removed white plaques to foreskin and glans of penis. Concerning for candidiasis vs STD. -Continue Valtrex  - G/C Chlamydia negative - RPR negative - HIV screening pending - Diflucan '150mg'$  for 2 more days with topical lotrimin BID  Right lower lobe nodule CT showed 56m noncalcified right lower lobe nodule in an area of post pneumonic scarring, recommend f/u repeat imaging in 3-4 months.  -outpatient follow up  Deconditioning SNF placement Physical therapy recommends SNF placement. Case management following for appropriate SNF placement given patient's insurance. - appreciate TOC assistance   Diet: Carb Modified VTE: Eliquis Bowel: Senna, Miralax, and Lactulose Code: Full   LOS: 4 days   SShirlyn Goltz Medical Student 09/26/2021, 7:04 AM

## 2021-09-26 NOTE — TOC Progression Note (Signed)
Transition of Care [facility]) - Progression Note    Patient Details  Name: Mark Cabrera MRN: [id number] Date of Birth: 07/02/1950  Transition of Care [facility]) CM/SW [place], [place] Phone Number: 09/26/2021, 4:45 PM  Clinical Narrative:     Pt now has qualifying three midnight stay for SNF, Daughter agreeable to [place] [facility] [place]. [facility] states they will have a bed when ready. Daughter unsure if she will be able to transport him, will see how he is doing at DC. Thanks.  Expected Discharge Plan: [place] Barriers to Discharge: Continued Medical Work up, SNF Pending bed offer  Expected Discharge Plan and Services Expected Discharge Plan: [place] arrangements for the past 2 months: Single Family Home                                       Social Determinants of Health (SDOH) Interventions    Readmission Risk Interventions     No data to display

## 2021-09-26 NOTE — Progress Notes (Signed)
Physical Therapy Treatment Patient Details Name: Mark Cabrera MRN: [id number] DOB: July 06, 1950 Today's Date: 09/26/2021   History of Present Illness 71 y.o. male presents to [facility] hospital on 09/21/2021 with 2 weeks of dizziness and malaise. Pt found to be hypotensive and with elevated A1c. Pt and daughter report progressive memory loss. PMH includes AKI, CHF, Afib, CAD s/p CABG, DM, GERD, HLD, HTN.    PT Comments    Patient lethargic and not feeling well upon PT arrival. "I did not even know it was morning time." Mobility limited due to lethargy, weakness and orthostasis. Requires Min-Mod A for bed mobility, standing and taking a few steps along side the bed. Dizziness reported with all movement and difficulty sitting upright today with LOB towards right. Orthostatic vitals positive, see below. Supine BP 122/70 Sitting BP 111/78 Sanding BP 94/68 Reports dizziness throughout. Encouraged working with mobility tech this afternoon to see how he does. Continue to recommend SNF due to safety concerns and pt regressing with his mobility. Will continue to follow and progress as tolerated.     Recommendations for follow up therapy are one component of a multi-disciplinary discharge planning process, led by the attending physician.  Recommendations may be updated based on patient status, additional functional criteria and insurance authorization.  Follow Up Recommendations  Skilled nursing-short term rehab (<3 hours/day) Can patient physically be transported by private vehicle: Yes   Assistance Recommended at Discharge Frequent or constant Supervision/Assistance  Patient can return home with the following A little help with walking and/or transfers;A little help with bathing/dressing/bathroom;Direct supervision/assist for medications management;Direct supervision/assist for financial management;Assistance with cooking/housework;Assist for transportation   Equipment Recommendations  None recommended by  PT    Recommendations for Other Services       Precautions / Restrictions Precautions Precautions: Fall;Other (comment) Precaution Comments: poor memory, orthostatic Restrictions Weight Bearing Restrictions: No     Mobility  Bed Mobility Overal bed mobility: Needs Assistance Bed Mobility: Supine to Sit     Supine to sit: Mod assist, HOB elevated Sit to supine: Min assist, HOB elevated   General bed mobility comments: Pt holding arm out to help with elevating trunk to get to EOB, favors right lateral lean, difficulty holding up head.    Transfers Overall transfer level: Needs assistance Equipment used: Rolling walker (2 wheels) Transfers: Sit to/from Stand Sit to Stand: Min assist           General transfer comment: Min A to steady in standing. Cues to open eyes. Reports dizziness in standing.    Ambulation/Gait Ambulation/Gait assistance: Min guard Gait Distance (Feet): 3 Feet Assistive device: Rolling walker (2 wheels)         General Gait Details: Able to side step along side bed with use of RW and Min A for balance, + dizziness, "I need to lay down now." Declined further mobility.   Stairs             Wheelchair Mobility    Modified Rankin (Stroke Patients Only)       Balance Overall balance assessment: Needs assistance Sitting-balance support: No upper extremity supported, Feet supported Sitting balance-Leahy Scale: Fair Sitting balance - Comments: Pt with difficulty sitting upright today, favors right lateral lean, fatigues quickly, dizziness reported. Postural control: Right lateral lean Standing balance support: During functional activity, Bilateral upper extremity supported Standing balance-Leahy Scale: Poor Standing balance comment: Requires UE support using RW and Min A at times. Dizziness reported  Cognition Arousal/Alertness: Lethargic Behavior During Therapy: Flat affect Overall Cognitive  Status: Impaired/Different from baseline Area of Impairment: Orientation, Attention, Memory, Following commands, Safety/judgement, Awareness                 Orientation Level: Disoriented to, Time Current Attention Level: Sustained Memory: Decreased short-term memory Following Commands: Follows one step commands consistently, Follows multi-step commands inconsistently Safety/Judgement: Decreased awareness of safety, Decreased awareness of deficits Awareness: Emergent Problem Solving: Requires verbal cues General Comments: Lethargic during session, repeating self, "what do you need me to do?" after already giving the instruction/command.        Exercises      General Comments General comments (skin integrity, edema, etc.): Supine BP 122/70, sitting BP 111/78, standing BP 94/68. Dizziness reported throughout.      Pertinent Vitals/Pain Pain Assessment Pain Assessment: Faces Faces Pain Scale: Hurts little more Pain Location: BLEs Pain Descriptors / Indicators: Sore, Discomfort Pain Intervention(s): Monitored during session, Repositioned, Limited activity within patient's tolerance    Home Living                          Prior Function            PT Goals (current goals can now be found in the care plan section) Progress towards PT goals: Not progressing toward goals - comment (dizziness, orthostatic and lethargy)    Frequency    Min 2X/week      PT Plan Current plan remains appropriate    Co-evaluation              AM-PAC PT "6 Clicks" Mobility   Outcome Measure  Help needed turning from your back to your side while in a flat bed without using bedrails?: A Little Help needed moving from lying on your back to sitting on the side of a flat bed without using bedrails?: A Lot Help needed moving to and from a bed to a chair (including a wheelchair)?: A Little Help needed standing up from a chair using your arms (e.g., wheelchair or bedside  chair)?: A Little Help needed to walk in hospital room?: Total Help needed climbing 3-5 steps with a railing? : Total 6 Click Score: 13    End of Session Equipment Utilized During Treatment: Gait belt Activity Tolerance: Treatment limited secondary to medical complications (Comment) (dizziness, lethargy) Patient left: in bed;with call bell/phone within reach;with bed alarm set Nurse Communication: Mobility status PT Visit Diagnosis: Other abnormalities of gait and mobility (R26.89)     Time: 7353-2992 PT Time Calculation (min) (ACUTE ONLY): 19 min  Charges:  $Therapeutic Activity: 8-22 mins                     Mark Cabrera, PT, DPT Acute Rehabilitation Services Secure chat preferred Office [phone]      Mark Cabrera 09/26/2021, 12:19 PM

## 2021-09-27 ENCOUNTER — Ambulatory Visit: Payer: Self-pay | Admitting: Licensed Clinical Social Worker

## 2021-09-27 DIAGNOSIS — R509 Fever, unspecified: Secondary | ICD-10-CM

## 2021-09-27 DIAGNOSIS — D709 Neutropenia, unspecified: Secondary | ICD-10-CM

## 2021-09-27 LAB — CBC WITH DIFFERENTIAL/PLATELET
Abs Immature Granulocytes: 0.01 10*3/uL (ref 0.00–0.07)
Basophils Absolute: 0 10*3/uL (ref 0.0–0.1)
Basophils Relative: 0 %
Eosinophils Absolute: 0 10*3/uL (ref 0.0–0.5)
Eosinophils Relative: 1 %
HCT: 29.6 % — ABNORMAL LOW (ref 39.0–52.0)
Hemoglobin: 10.4 g/dL — ABNORMAL LOW (ref 13.0–17.0)
Immature Granulocytes: 1 %
Lymphocytes Relative: 49 %
Lymphs Abs: 0.8 10*3/uL (ref 0.7–4.0)
MCH: 31.5 pg (ref 26.0–34.0)
MCHC: 35.1 g/dL (ref 30.0–36.0)
MCV: 89.7 fL (ref 80.0–100.0)
Monocytes Absolute: 0.1 10*3/uL (ref 0.1–1.0)
Monocytes Relative: 8 %
Neutro Abs: 0.6 10*3/uL — ABNORMAL LOW (ref 1.7–7.7)
Neutrophils Relative %: 41 %
Platelets: 133 10*3/uL — ABNORMAL LOW (ref 150–400)
RBC: 3.3 MIL/uL — ABNORMAL LOW (ref 4.22–5.81)
RDW: 13.3 % (ref 11.5–15.5)
Smear Review: NORMAL
WBC: 1.5 10*3/uL — ABNORMAL LOW (ref 4.0–10.5)
nRBC: 0 % (ref 0.0–0.2)

## 2021-09-27 LAB — BASIC METABOLIC PANEL
Anion gap: 6 (ref 5–15)
BUN: 18 mg/dL (ref 8–23)
CO2: 23 mmol/L (ref 22–32)
Calcium: 8.6 mg/dL — ABNORMAL LOW (ref 8.9–10.3)
Chloride: 104 mmol/L (ref 98–111)
Creatinine, Ser: 0.84 mg/dL (ref 0.61–1.24)
GFR, Estimated: >60 mL/min (ref >60)
Glucose, Bld: 258 mg/dL — ABNORMAL HIGH (ref 70–99)
Potassium: 3.9 mmol/L (ref 3.5–5.1)
Sodium: 133 mmol/L — ABNORMAL LOW (ref 135–145)

## 2021-09-27 LAB — GLUCOSE, CAPILLARY
Glucose-Capillary: 258 mg/dL — ABNORMAL HIGH (ref 70–99)
Glucose-Capillary: 298 mg/dL — ABNORMAL HIGH (ref 70–99)
Glucose-Capillary: 326 mg/dL — ABNORMAL HIGH (ref 70–99)
Glucose-Capillary: 263 mg/dL — ABNORMAL HIGH (ref 70–99)

## 2021-09-27 LAB — HIV-1 RNA QUANT-NO REFLEX-BLD
HIV 1 RNA Quant: <20 copies/mL
LOG10 HIV-1 RNA: UNDETERMINED log10copy/mL

## 2021-09-27 LAB — HIV ANTIBODY (ROUTINE TESTING W REFLEX): HIV Screen 4th Generation wRfx: NONREACTIVE

## 2021-09-27 LAB — PATHOLOGIST SMEAR REVIEW

## 2021-09-27 MED ORDER — LACTULOSE 10 GM/15ML PO SOLN
30.0000 g | Freq: Three times a day (TID) | ORAL | Status: DC
  Administered 2021-09-27 – 2021-09-28 (×3): 30 g via ORAL
  Filled 2021-09-27 (×3): qty 45

## 2021-09-27 MED ORDER — INSULIN ASPART 100 UNIT/ML IJ SOLN
8.0000 [IU] | Freq: Three times a day (TID) | INTRAMUSCULAR | Status: DC
Start: 2021-09-27 — End: 2021-09-28
  Administered 2021-09-27 – 2021-09-28 (×3): 8 [IU] via SUBCUTANEOUS

## 2021-09-27 MED ORDER — LACTATED RINGERS IV BOLUS
250.0000 mL | Freq: Once | INTRAVENOUS | Status: AC
Start: 2021-09-27 — End: 2021-09-27
  Administered 2021-09-27: 250 mL via INTRAVENOUS

## 2021-09-27 MED ORDER — LACTULOSE 10 GM/15ML PO SOLN
20.0000 g | Freq: Three times a day (TID) | ORAL | Status: DC
  Administered 2021-09-27: 20 g via ORAL
  Filled 2021-09-27: qty 30

## 2021-09-27 MED ORDER — SENNOSIDES-DOCUSATE SODIUM 8.6-50 MG PO TABS
2.0000 | ORAL_TABLET | Freq: Every day | ORAL | Status: DC
  Administered 2021-09-27: 2 via ORAL
  Filled 2021-09-27: qty 2

## 2021-09-27 MED ORDER — INSULIN GLARGINE-YFGN 100 UNIT/ML ~~LOC~~ SOLN
40.0000 [IU] | Freq: Every day | SUBCUTANEOUS | Status: DC
  Administered 2021-09-27: 40 [IU] via SUBCUTANEOUS
  Filled 2021-09-27 (×2): qty 0.4

## 2021-09-27 NOTE — Progress Notes (Signed)
Mobility Specialist: Progress Note   09/27/21 1545  Mobility  Activity Ambulated with assistance in hallway  Level of Assistance Minimal assist, patient does 75% or more  Assistive Device Cane  Distance Ambulated (ft) 200 ft  Activity Response Tolerated fair  $Mobility charge 1 Mobility   Pre-Mobility: 83 HR, 121/76 (91) BP, 100% SpO2 Post-Mobility: 85 HR, 138/71 (93) BP, 100% SpO2  Pt received in the bed and agreeable to mobility. Multiple LOB during ambulation requiring MinA to correct instances of lateral LOB. Verbal cues to slow down as well. No c/o throughout. Pt sitting EOB after session with call bell at his side.   [facility] Aaliah Jorgenson Mobility Specialist Mobility Specialist [address]: [phone]

## 2021-09-27 NOTE — Patient Outreach (Signed)
  Care Coordination   Follow Up Visit Note   09/27/2021 Name: Mark Cabrera MRN: [id number] DOB: Apr 23, 1950  Mark Cabrera is a 71 y.o. year old male who sees [name], MD for primary care. I  Spoke with [place] .  What matters to the patients health and wellness today?  APS report,Living condition   Goals Addressed   None     SDOH assessments and interventions completed:   Yes   Care Coordination Interventions Activated:  Yes Care Coordination Interventions:   Yes, provided  Follow up plan: No further intervention required.  Encounter Outcome:  Pt. Visit Completed. SW removed from care team. Patient refused assistance from APS.  Lenor Derrick , MSW Social Worker [facility] Care Management  [phone]

## 2021-09-27 NOTE — Progress Notes (Addendum)
Subjective:  He reports feeling puny still today. Pt has been moving around the room more today. No BM yet. He wants to leave but knows that he needs some more treatment. He denies any chest pain. He reports shortness of breath and weakness.   Objective:  Vital signs in last 24 hours: Vitals:   09/26/21 2026 09/27/21 0054 09/27/21 0400 09/27/21 0738  BP: 101/66 121/71 (!) 141/65 129/71  Pulse: 72 79 77 75  Resp: '18 20 20 19  '$ Temp: (!) 97.2 F (36.2 C) 98.7 F (37.1 C)  98 F (36.7 C)  TempSrc: Oral Oral  Oral  SpO2: 97% 98% 100% 100%  Weight:      Height:       Weight change:   Intake/Output Summary (Last 24 hours) at 09/27/2021 1117 Last data filed at 09/27/2021 0058 Gross per 24 hour  Intake 723 ml  Output 900 ml  Net -177 ml   General: chronically ill-appearing elderly male, laying in bed, NAD. HENT: Clear rhinorrhea. No scleral icterus. CV: normal rate and regular rhythm, no m/r/g. Pulm: CTABL, normal WOB on RA. Abdomen: soft, nondistended, RUQ tenderness, normoactive BS. GU: Foreskin and glans without white plaques. Small amount of erythema with one small lesion. MSK: no peripheral edema. Skin: warm and diaphoretic Psych: appropriate mood and affect.  Assessment/Plan:  Principal Problem:   Fever, unspecified Active Problems:   Diabetes mellitus with neurological manifestation (HCC)   Chronic diastolic heart failure (HCC)   Paroxysmal atrial fibrillation (HCC)   Hypotension   Physical deconditioning   Malnutrition of moderate degree   Patient Summary: Mark Cabrera is a 71 y.o. with a pertinent PMH of DM, HLD, HEENT, heart failure, CAD status post CABG, AFib, sleep apnea, compensated cirrhosis, and multiple CVAs admitted for hypotension and uncontrolled DM, currently under going acute sepsis work up.  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 WBC down to 1.5 today  with ANC of 0.6. He has remained afebrile off antibiotics now for 24 hours. Suspect acute viral infection causing bone marrow suppression however source is unclear. RVP was negative. He does have some evidence of pharyngitis however denies symptoms. HIV was negative. Blood smear showing pancytopenia without blasts, and occasional reactive plasmacytoid lymphs but no hairy cells. Will check hepatitis serologies given his history of cirrhosis.  - Repeat AM CBC with diff  - F/u hep B / C labs    Fever, unclear etiology Patient has been fever free for 24 hours without antibiotics. Pt's alertness improved today from yesterday. Blood cultures without growth day 3. Trace ascites seen on CT small concern for SBP, RUQ abdominal tenderness on exam. Exam without signs of an acute infection, higher suspicion for viral source given development of cough, malaise, rhinorrhea, and diaphoresis likely viral bronchitis vs pharyngitis. Giving lactulose for stool burden and possible hepatic encephalopathy, low suspicion given history of compensated cirrhosis, no fluid wave or asterixis. Pt had first BM today. Pt pancytopenic, WBC 1.5, Neut# 0.6 potentially due to viral illness vs liver disease. Platelets 133 and hemoglobin 10.4 which is stable from prior.  - tylenol for antipyretic - RVP negative - Blood smear pending - Orthostatic vitals - Lactulose 30g TID - f/u blood cultures day 3 - HIV non reactive   Chronic HFpEF 09/23/2021 echo showed LV EF 50 to 55%, no RWMA.  - discontinue PO lasix '40mg'$  given decreased PO intake -Weight daily -Strict I/O's  Insulin-dependent DMT2 Patient's A1c greater than 14%.  Fasting CBG today 258.  -Semglee to 40u qhs -Novolog 8u TID with meals, only administer if pt eats more than 50% of meals -moderate SSI   Dementia, likely vascular Malnutrition Delirium Patient with a history of not being able to perform ADLs, or IADLs. Presented to clinic with medication noncompliance,  multiple falls, and poor p.o. intake. MRI brain from August 2022 showed multiple lacunar infarcts in the right thalamus, left basal ganglia, right cerebellum, and left medulla. Patient's mentation has continued to deteriorate since then. Now with evidence of declining executive functioning and short and long-term memory. We have tried to test with MoCA but patient has declined x3. Concern for dementia-related behavioral disturbance vs hospital-associated delirium. Patient deemed not safe to return home by physical therapy, now awaiting SNF placement. -Delirium precautions -reorient as able -continue Glucerna shake 3 times daily for moderate malnutrition -appreciate TOC assistance with SNF placement   Hypertension Holding anti-hypertensive medication as patient presented with hypotension. SBPs now 120s. It is unclear whether patient was taking medications at home.  -continue to monitor   Atrial Fibrillation Pt with history of A-fib, anticoagulated on Eliquis. Currently in regular rhythm.  -Continue Eliquis   Cirrhosis  Pt with prior RUQ [place] in 2021 that showed cirrhotic hepatic morphology without evidence of focal lesion. Patient also with a history of leukopenia and low platelets. No fluid wave or asterixis on exam. Pt more acutely altered yesterday. No history of decompensated cirrhosis. Pt with first BM today. -Lactulose 30g TID for concern for hepatic encephalopathy   HSV2 Balanitis  Pt without white plaques to foreskin and glans of penis following completion of diflucan 3 day course.  -Continue Valtrex given history - HIV screening pending   Right lower lobe nodule CT showed 73m noncalcified right lower lobe nodule in an area of post pneumonic scarring, recommend f/u repeat imaging in 3-4 months.  -outpatient follow up   Deconditioning SNF placement Physical therapy recommends SNF placement. Case management following for appropriate SNF placement given patient's insurance. -  appreciate TOC assistance - [facility]    Diet: Carb Modified VTE: Eliquis Bowel: Senna, Miralax, and Lactulose Code: Full   LOS: 5 days   SShirlyn Goltz Medical Student 09/27/2021, 11:17 AM

## 2021-09-28 DIAGNOSIS — I5032 Chronic diastolic (congestive) heart failure: Secondary | ICD-10-CM | POA: Diagnosis not present

## 2021-09-28 DIAGNOSIS — F32A Depression, unspecified: Secondary | ICD-10-CM | POA: Diagnosis not present

## 2021-09-28 DIAGNOSIS — J069 Acute upper respiratory infection, unspecified: Secondary | ICD-10-CM | POA: Diagnosis not present

## 2021-09-28 DIAGNOSIS — R2681 Unsteadiness on feet: Secondary | ICD-10-CM | POA: Diagnosis not present

## 2021-09-28 DIAGNOSIS — M6259 Muscle wasting and atrophy, not elsewhere classified, multiple sites: Secondary | ICD-10-CM | POA: Diagnosis not present

## 2021-09-28 DIAGNOSIS — R911 Solitary pulmonary nodule: Secondary | ICD-10-CM | POA: Diagnosis not present

## 2021-09-28 DIAGNOSIS — F03A0 Unspecified dementia, mild, without behavioral disturbance, psychotic disturbance, mood disturbance, and anxiety: Secondary | ICD-10-CM | POA: Diagnosis not present

## 2021-09-28 DIAGNOSIS — E118 Type 2 diabetes mellitus with unspecified complications: Secondary | ICD-10-CM | POA: Diagnosis not present

## 2021-09-28 DIAGNOSIS — B009 Herpesviral infection, unspecified: Secondary | ICD-10-CM | POA: Diagnosis not present

## 2021-09-28 DIAGNOSIS — R5381 Other malaise: Secondary | ICD-10-CM | POA: Diagnosis not present

## 2021-09-28 DIAGNOSIS — K7460 Unspecified cirrhosis of liver: Secondary | ICD-10-CM | POA: Diagnosis not present

## 2021-09-28 DIAGNOSIS — F4321 Adjustment disorder with depressed mood: Secondary | ICD-10-CM | POA: Diagnosis not present

## 2021-09-28 DIAGNOSIS — K5901 Slow transit constipation: Secondary | ICD-10-CM | POA: Diagnosis not present

## 2021-09-28 DIAGNOSIS — I4891 Unspecified atrial fibrillation: Secondary | ICD-10-CM | POA: Diagnosis not present

## 2021-09-28 DIAGNOSIS — Z794 Long term (current) use of insulin: Secondary | ICD-10-CM | POA: Diagnosis not present

## 2021-09-28 DIAGNOSIS — R509 Fever, unspecified: Secondary | ICD-10-CM | POA: Diagnosis not present

## 2021-09-28 DIAGNOSIS — R41841 Cognitive communication deficit: Secondary | ICD-10-CM | POA: Diagnosis not present

## 2021-09-28 DIAGNOSIS — I959 Hypotension, unspecified: Secondary | ICD-10-CM | POA: Diagnosis not present

## 2021-09-28 DIAGNOSIS — D61818 Other pancytopenia: Secondary | ICD-10-CM | POA: Diagnosis not present

## 2021-09-28 DIAGNOSIS — R278 Other lack of coordination: Secondary | ICD-10-CM | POA: Diagnosis not present

## 2021-09-28 DIAGNOSIS — Z8673 Personal history of transient ischemic attack (TIA), and cerebral infarction without residual deficits: Secondary | ICD-10-CM | POA: Diagnosis not present

## 2021-09-28 DIAGNOSIS — D709 Neutropenia, unspecified: Secondary | ICD-10-CM | POA: Diagnosis not present

## 2021-09-28 DIAGNOSIS — R1312 Dysphagia, oropharyngeal phase: Secondary | ICD-10-CM | POA: Diagnosis not present

## 2021-09-28 DIAGNOSIS — M6281 Muscle weakness (generalized): Secondary | ICD-10-CM | POA: Diagnosis not present

## 2021-09-28 LAB — BASIC METABOLIC PANEL
Anion gap: 10 (ref 5–15)
BUN: 15 mg/dL (ref 8–23)
CO2: 23 mmol/L (ref 22–32)
Calcium: 9 mg/dL (ref 8.9–10.3)
Chloride: 102 mmol/L (ref 98–111)
Creatinine, Ser: 0.8 mg/dL (ref 0.61–1.24)
GFR, Estimated: >60 mL/min (ref >60)
Glucose, Bld: 190 mg/dL — ABNORMAL HIGH (ref 70–99)
Potassium: 3.9 mmol/L (ref 3.5–5.1)
Sodium: 135 mmol/L (ref 135–145)

## 2021-09-28 LAB — CBC WITH DIFFERENTIAL/PLATELET
Abs Immature Granulocytes: 0.01 10*3/uL (ref 0.00–0.07)
Basophils Absolute: 0 10*3/uL (ref 0.0–0.1)
Basophils Relative: 1 %
Eosinophils Absolute: 0 10*3/uL (ref 0.0–0.5)
Eosinophils Relative: 2 %
HCT: 30.3 % — ABNORMAL LOW (ref 39.0–52.0)
Hemoglobin: 10.6 g/dL — ABNORMAL LOW (ref 13.0–17.0)
Immature Granulocytes: 1 %
Lymphocytes Relative: 56 %
Lymphs Abs: 0.9 10*3/uL (ref 0.7–4.0)
MCH: 31.4 pg (ref 26.0–34.0)
MCHC: 35 g/dL (ref 30.0–36.0)
MCV: 89.6 fL (ref 80.0–100.0)
Monocytes Absolute: 0.1 10*3/uL (ref 0.1–1.0)
Monocytes Relative: 4 %
Neutro Abs: 0.6 10*3/uL — ABNORMAL LOW (ref 1.7–7.7)
Neutrophils Relative %: 36 %
Platelets: 148 10*3/uL — ABNORMAL LOW (ref 150–400)
RBC: 3.38 MIL/uL — ABNORMAL LOW (ref 4.22–5.81)
RDW: 13.2 % (ref 11.5–15.5)
WBC: 1.6 10*3/uL — ABNORMAL LOW (ref 4.0–10.5)
nRBC: 0 % (ref 0.0–0.2)

## 2021-09-28 LAB — HEPATITIS B SURFACE ANTIGEN: Hepatitis B Surface Ag: NONREACTIVE

## 2021-09-28 LAB — GLUCOSE, CAPILLARY
Glucose-Capillary: 189 mg/dL — ABNORMAL HIGH (ref 70–99)
Glucose-Capillary: 221 mg/dL — ABNORMAL HIGH (ref 70–99)

## 2021-09-28 LAB — HEPATITIS B SURFACE ANTIBODY,QUALITATIVE: Hep B S Ab: NONREACTIVE

## 2021-09-28 MED ORDER — ADULT MULTIVITAMIN W/MINERALS CH: 1.0000 | ORAL_TABLET | Freq: Every day | ORAL | 0 refills | Status: AC

## 2021-09-28 MED ORDER — LACTULOSE 10 GM/15ML PO SOLN: 30.0000 g | Freq: Three times a day (TID) | ORAL | 0 refills | Status: DC

## 2021-09-28 MED ORDER — THIAMINE HCL 100 MG PO TABS: 100.0000 mg | ORAL_TABLET | Freq: Every day | ORAL | 0 refills | Status: AC

## 2021-09-28 MED ORDER — GLUCERNA SHAKE PO LIQD
237.0000 mL | Freq: Three times a day (TID) | ORAL | 0 refills | Status: AC
Start: 2021-09-28 — End: 2021-10-28

## 2021-09-28 MED ORDER — SENNOSIDES-DOCUSATE SODIUM 8.6-50 MG PO TABS: 2.0000 | ORAL_TABLET | Freq: Every day | ORAL | Status: AC

## 2021-09-28 MED ORDER — HYDROXYZINE HCL 10 MG/5ML PO SYRP: 10.0000 mg | ORAL_SOLUTION | Freq: Three times a day (TID) | ORAL | 0 refills | Status: DC | PRN

## 2021-09-28 MED ORDER — POLYETHYLENE GLYCOL 3350 17 G PO PACK: 17.0000 g | PACK | Freq: Every day | ORAL | 0 refills | Status: DC

## 2021-09-28 MED ORDER — FOLIC ACID 1 MG PO TABS: 1.0000 mg | ORAL_TABLET | Freq: Every day | ORAL | Status: AC

## 2021-09-28 NOTE — Progress Notes (Signed)
Nutrition Follow-up  DOCUMENTATION CODES:   Non-severe (moderate) malnutrition in context of social or environmental circumstances  INTERVENTION:   Glucerna Shake TID  Encourage PO intake   NUTRITION DIAGNOSIS:   Moderate Malnutrition related to social / environmental circumstances as evidenced by moderate muscle depletion, moderate fat depletion. Ongoing.   GOAL:   Patient will meet greater than or equal to 90% of their needs Progressing.   MONITOR:   PO intake, Supplement acceptance  REASON FOR ASSESSMENT:   Malnutrition Screening Tool    ASSESSMENT:   Pt with PMH of DM, HLD, HTN, diastolic HF, CAD s/p CABG, A-fib, sleep apnea, compensated cirrhosis, CVA admitted with hypotension, uncontrolled DM, and worsening cognitive impairment.   Spoke with pt, he feels his intake is improving. DM coordinator is following and adjusting insulin regimen.    Medications reviewed and include: glucerna shake, folic acid, SSI, novolog, semglee 40 units daily, lactulose, MVI with minerals, miralax, senokot-s, thiamine  Labs reviewed:  Vitamin B 12: 460 A1C: >14 CBG's: 189-326  UOP: 1300 ml   Diet Order:   Diet Order             Diet - low sodium heart healthy           DIET DYS 3 Room service appropriate? Yes with Assist; Fluid consistency: Thin  Diet effective now                   EDUCATION NEEDS:   Education needs have been addressed  Skin:  Skin Assessment: Reviewed RN Assessment  Last BM:  7/18 large  Height:   Ht Readings from Last 1 Encounters:  09/22/21 '5\' 8"'$  (1.727 m)    Weight:   Wt Readings from Last 1 Encounters:  09/28/21 81.7 kg    BMI:  Body mass index is 27.39 kg/m.  Estimated Nutritional Needs:   Kcal:  1900-2100  Protein:  90-100 grams  Fluid:  >1.9 L/day  Lockie Pares., RD, LDN, CNSC See AMiON for contact information

## 2021-09-28 NOTE — Progress Notes (Signed)
Inpatient Diabetes Program Recommendations  AACE/ADA: New Consensus Statement on Inpatient Glycemic Control (2015)  Target Ranges:  Prepandial:   less than 140 mg/dL      Peak postprandial:   less than 180 mg/dL (1-2 hours)      Critically ill patients:  140 - 180 mg/dL   Lab Results  Component Value Date   GLUCAP 189 (H) 09/28/2021   HGBA1C >14.0 09/21/2021    Review of Glycemic Control  Latest Reference Range & Units 09/25/21 11:14 09/25/21 16:19 09/25/21 20:44 09/25/21 22:38 09/26/21 06:32 09/26/21 12:11  Glucose-Capillary 70 - 99 mg/dL 306 (H) 212 (H) 293 (H) 275 (H) 323 (H) 317 (H)   Diabetes history: DM 2 Outpatient Diabetes medications: Soliqua 45 units q hs, Novolog 18 units tid, Metformin 500 mg bid Current orders for Inpatient glycemic control:  Semglee 40 units qd Novolog 0-15 units tid + 0-5 units hs correction Novolog 8 units tid with meals   Inpatient Diabetes Program Recommendations:    -  Increase Semglee to home basal dose of 45 units daily.   -  Increase Novolog meal coverage to 12 units tid.    Thanks,  Tama Headings RN, MSN, BC-ADM Inpatient Diabetes Coordinator Team Pager [phone] (8a-5p)

## 2021-09-28 NOTE — Care Management Important Message (Signed)
Important Message  Patient Details  Name: TEVITA GOMER MRN: [id number] Date of Birth: 06/26/1950   Medicare Important Message Given:  Yes     [name] 09/28/2021, 9:25 AM

## 2021-09-28 NOTE — TOC Transition Note (Signed)
Transition of Care [facility]) - CM/SW Discharge Note   Patient Details  Name: Mark Cabrera MRN: [id number] Date of Birth: [birth year]  Transition of Care [facility] CM/SW Contact:  Coralee Pesa, [place] Phone Number: 09/28/2021, 2:27 PM   Clinical Narrative:    Pt to be transported to [facility] via daughter. Nurse to call report to [phone]   Final next level of care: Skilled Nursing Facility Barriers to Discharge: Barriers Resolved   Patient Goals and CMS Choice        Discharge Placement              Patient chooses bed at: [facility] Patient to be transferred to facility by: Daughter Name of family member notified: Mary Patient and family notified of of transfer: 09/28/21  Discharge Plan and Services                                     Social Determinants of Health (SDOH) Interventions     Readmission Risk Interventions     No data to display

## 2021-09-28 NOTE — Discharge Summary (Signed)
Name: Mark Cabrera MRN: [id number] DOB: 01-Oct-1950 71 y.o. PCP: [name], MD  Date of Admission: 09/21/2021  6:05 PM Date of Discharge: 09/28/2021 1:42 PM Attending Physician: Dr. Philipp Ovens  Discharge Diagnosis: Principal Problem:   Fever, unspecified Active Problems:   Diabetes mellitus with neurological manifestation (HCC)   Chronic diastolic heart failure (HCC)   Paroxysmal atrial fibrillation (HCC)   Hypotension   Physical deconditioning   Malnutrition of moderate degree   Neutropenia ([place])    Discharge Medications: Allergies as of 09/28/2021       Reactions   Morpholine Salicylate Other (See Comments)   Hallucinations   Penicillins Other (See Comments)   Allergic- reaction?? Has patient had a PCN reaction causing immediate rash, facial/tongue/throat swelling, SOB or lightheadedness with hypotension: Unk Has patient had a PCN reaction causing severe rash involving mucus membranes or skin necrosis: Unk Has patient had a PCN reaction that required hospitalization: Unk Has patient had a PCN reaction occurring within the last 10 years: No If all of the above answers are "NO", then may proceed with Cephalosporin use. Other reaction(s): Unknown   Morphine And Related Other (See Comments)   Hallucinations   Sglt2 Inhibitors Rash, Other (See Comments)   Candidiasis infection prone   Statins Rash, Other (See Comments)   Severe rash and back pain, and muscle pain        Medication List     STOP taking these medications    lisinopril 10 MG tablet Commonly known as: ZESTRIL       TAKE these medications    Accu-Chek Guide test strip Generic drug: glucose blood Use as instructed   Accu-Chek Softclix Lancets lancets Use as instructed   acetaminophen 325 MG tablet Commonly known as: TYLENOL Take 2 tablets (650 mg total) by mouth every 4 (four) hours as needed for headache or mild pain.   Eliquis 5 MG Tabs tablet Generic drug: apixaban Take 1 tablet  by mouth twice daily What changed: how much to take   escitalopram 10 MG tablet Commonly known as: LEXAPRO Take 1 tablet (10 mg total) by mouth daily.   feeding supplement (GLUCERNA SHAKE) Liqd Take 237 mLs by mouth 3 (three) times daily between meals.   folic acid 1 MG tablet Commonly known as: FOLVITE Take 1 tablet (1 mg total) by mouth daily. Start taking on: September 29, 2021   hydrOXYzine 10 MG/5ML syrup Commonly known as: ATARAX Take 5 mLs (10 mg total) by mouth 3 (three) times daily as needed for anxiety.   Insulin Pen Needle 31G X 5 MM Misc Use one pen needle three times daily. Dx E11.49   lactulose 10 GM/15ML solution Commonly known as: CHRONULAC Take 45 mLs (30 g total) by mouth 3 (three) times daily.   metFORMIN 500 MG tablet Commonly known as: Glucophage Take 1 tablet (500 mg total) by mouth 2 (two) times daily with a meal.   multivitamin with minerals Tabs tablet Take 1 tablet by mouth daily. Start taking on: September 29, 2021   NovoLOG FlexPen 100 UNIT/ML FlexPen Generic drug: insulin aspart INJECT 18 UNITS UNDER THE SKIN THREE TIMES DAILY WITH MEALS What changed:  how much to take how to take this when to take this additional instructions   polyethylene glycol 17 g packet Commonly known as: MIRALAX / GLYCOLAX Take 17 g by mouth daily. Start taking on: September 30, 5395   Repatha SureClick 673 MG/ML Soaj Generic drug: Evolocumab INJECT 1 PEN INTO THE SKIN EVERY  14 (FOURTEEN) DAYS.   senna-docusate 8.6-50 MG tablet Commonly known as: Senokot-S Take 2 tablets by mouth at bedtime.   Soliqua 100-33 UNT-MCG/ML Sopn Generic drug: Insulin Glargine-Lixisenatide Inject 40 Units into the skin daily. What changed:  how much to take when to take this   thiamine 100 MG tablet Take 1 tablet (100 mg total) by mouth daily. Start taking on: September 29, 2021   valACYclovir 1000 MG tablet Commonly known as: VALTREX Take 1 tablet (1,000 mg total) by mouth 2 (two) times  daily.   vitamin B-12 100 MCG tablet Commonly known as: CYANOCOBALAMIN Take 100 mcg by mouth daily.        Disposition and follow-up:   Mark Cabrera was discharged from [facility] in Stable condition.  At the hospital follow up visit please address:  1.  Follow-up: Follow up BP as patient was hypotensive during this hospitalization. Reassess need for restaring outpatient antihypertensives and diuretics. Consider orthostatic hypotension management with Midodrine Follow up BM frequency given patient's cirrhosis history. Monitor Liver function panel for progression of disease. Continue monitoring patient's A1c and DM management Follow patient for recurrent herpes flares, requiring Valtrex treatment Follow CBC as patient presented with acute on chronic pancytopenias Follow up imaging for incidental 7 mm LLL lung nodule in 3-6 months Recommend redirection and consta   2.  Labs / imaging needed at time of follow-up: cbc, bmp, BNP,  CT imaging in 3-6 months  3.  Pending labs/ test needing follow-up: none  4.  Medication Changes  STOPPED  - Lisinopril, Furosemide until BP and orthostatic hypotension is re-evaluated at SNF or at PCP follow up   ADDED  - Lactulose 30 g BID  - Valtrex   - Hydroxyzine PRN for anxiety  Follow-up Appointments:  Follow-up Information     [name], MD Follow up.   Specialty: Internal Medicine Contact information: [place] [address] [phone]         Burnell Blanks, MD .   Specialty: Cardiology Contact information: [place] [place] [address] [phone]                 Hospital Course by problem list: Mark Cabrera is a 71 y.o. with a pertinent PMH of DM, HLD, HEENT, heart failure, CAD status post CABG, AFib, sleep apnea, compensated cirrhosis, and multiple CVAs admitted for hypotension and uncontrolled DM in the setting of noncompliant medication  management and poor outpatient follow up in the setting of cognitive impairment, with subsequent sepsis rule out.  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 WBC down to 1.6 today with ANC of 0.6. He has remained afebrile off antibiotics initially treated empirically with IV vancomycin and cefepime. Suspect acute viral infection causing bone marrow suppression however source is unclear. RVP was negative. HIV was negative. Blood smear showing pancytopenia without blasts, and occasional reactive plasmacytoid lymphs but no hairy cells. Hepatitis serologies non reactive. Recommend outpatient CBC follow up and need for further studies.    Fever, unclear etiology Patient developed fever to 102.1 on 07/15 started empirically on IV vanc and cefepime and Tylenol for fever control. Patient has been fever free without antibiotics for 72 hours. Blood cultures without growth day 4. Exam without signs of an acute infection, higher suspicion for viral source given development of cough, malaise, rhinorrhea, and diaphoresis likely viral bronchitis vs pharyngitis.  Giving lactulose for stool burden and possible hepatic encephalopathy, low suspicion given history of compensated cirrhosis, no fluid wave or asterixis. Pt had only BM throughout hospital course, treating with '30mg'$  Lactulose TID.   Hypotension Pt presented from clinic for admission given hypotension to 70/40 likely hypovolemic in nature in the setting of poor p.o intake, occasional mismanagement, and deconditioning to progressive memory impairment. BP significantly improved with IV hydration and currently normotensive. Will need PCP follow up for restarting antihypertensive meds.  Mild cognitive impairment Deconditioning, SNF placement Pt presented to clinic with his daughter, [name]. She reports he has not been doing well at home, including medication non-compliance,  multiple falls, lack of cleanliness, lack of food/water. Concern pt has progressed to dementia with his symptoms possible vascular dementia given multiple stokes and medication mismanagement. He has been checked for reversible causes, including B12 and TSH, both of which were normal. Patient was no amenable to MoCA while inpatient. However, Patient has exhibited evidence of cognitive deficits in the domains of memory and executive function. 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 [name] is seeking out to become patient's guardian. Pt has placement with [facility] and discharged to same.Recommend restarting SSRI and PRN hydroxyzine for mood management.  Diabetes mellitus Type 2 Insulin dependent  Patient's A1c >14.0%. Of note, patient's daughter reports he has trouble reading the insulin pen. Will need to find best solution for him to take insulin safely. Pt on SSI, 8 units with meals, and 40 units of Semglee while in patient. Continue outpatient DM medications at discharge from facility.    Chronic HFpEF Pt with history of diastolic HF. EF 50-55% unchanged from 2022.  BNP 317. CXR showed stable pulmonary edema. ECHO showed no wall motion abnormalities. Bioprosthetic aortic value with mean gradient of 24 mmHg elevated from prior with appearance of some limitation of valve opening. No regurgitation. Given patient's hypotension and orthostasis on admission, Furosemide was stopped. Consider re-evaluation of fluid status and need for diuretic management. Recommend outpatient f/u with Cardiology   Atrial Fibrillation Pt with history of A-fib, anticoagulated on Eliquis. Remained in regular rhythm throughout hospital course. Unclear if patient has been compliant with medication at home.  Cirrhosis  Pt with prior RUQ [place] in 2021 that showed cirrhotic hepatic morphology without evidence of focal lesion. Patient also with a history of  leukopenia and low platelets. No fluid wave or asterixis on exam. Liver enzymes nml range, albumin 3.1-may be secondary to nutritional deficiency. Started on Lactulose for persistent constipation.  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 Will need to continue Valtrex outpatient if flares. Reassess need for topical Lotrimin treatment  R lower lobe lung nodule  Incidental finding on CT abdomen. 7 mm noncalcified right lower lobe nodule in an area of post pneumonic scarring. Per Fleischner Society Guidelines, recommend a non-contrast Chest CT at 3-6 months.   Disposition and Follow up Please follow up with PCP regarding right lower lobe lung nodule seen on CT imaging recommend repeat imaging in 3-4 months, MoCa for further dementia evaluation, CBC to reevaluate pancytopenia and restarting home medications.  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  Discharge Subjective: Feeling better and okay to go to SNF. Concerned about how to get there as daughter is working. Patient denies pain, breathing  better, and tolerating PO intake. Has 1 BM yesterday and denies pain with urination.  Discharge Exam:   Blood pressure 121/72, pulse 65, temperature 98 F (36.7 C), temperature source Oral, resp. rate 18, height '5\' 8"'$  (1.727 m), weight 81.7 kg, SpO2 100 %.  Constitutional:ill-appearing man sitting in bed, in no acute distress HENT: normocephalic atraumatic, mucous membranes moist Cardiovascular: regular rate and rhythm, no m/r/g, no JVD Pulmonary/Chest: normal work of breathing on room air, lungs clear to auscultation bilaterally. No crackles  Abdominal: soft, non-tender, non-distended.  Neurological: alert & oriented x 3 MSK: no gross abnormalities. No pitting edema Skin: warm and dry Psych:  Normal mood and affect  Pertinent Labs, Studies, and Procedures:     Latest Ref Rng & Units 09/28/2021    3:50 AM 09/27/2021    4:11 AM 09/26/2021    5:01 AM  CBC  WBC 4.0 - 10.5 K/uL 1.6  1.5  2.2   Hemoglobin 13.0 - 17.0 g/dL 10.6  10.4  11.6   Hematocrit 39.0 - 52.0 % 30.3  29.6  33.0   Platelets 150 - 400 K/uL 148  133  148        Latest Ref Rng & Units 09/28/2021    3:50 AM 09/27/2021    4:11 AM 09/26/2021    5:01 AM  CMP  Glucose 70 - 99 mg/dL 190  258  277   BUN 8 - 23 mg/dL '15  18  17   '$ Creatinine 0.61 - 1.24 mg/dL 0.80  0.84  1.05   Sodium 135 - 145 mmol/L 135  133  131   Potassium 3.5 - 5.1 mmol/L 3.9  3.9  4.0   Chloride 98 - 111 mmol/L 102  104  98   CO2 22 - 32 mmol/L '23  23  21   '$ Calcium 8.9 - 10.3 mg/dL 9.0  8.6  8.5   Total Protein 6.5 - 8.1 g/dL   6.5   Total Bilirubin 0.3 - 1.2 mg/dL   1.5   Alkaline Phos 38 - 126 U/L   69   AST 15 - 41 U/L   32   ALT 0 - 44 U/L   28     CT ABDOMEN PELVIS W WO CONTRAST  Result Date: 09/25/2021 CLINICAL DATA:  Sepsis workup. EXAM: CT ABDOMEN AND PELVIS WITHOUT AND WITH CONTRAST TECHNIQUE: Multidetector CT imaging of the abdomen and pelvis was performed following the standard protocol before and following the bolus administration of intravenous contrast. RADIATION DOSE REDUCTION: This exam was performed according to the departmental dose-optimization program which includes automated exposure control, adjustment of the mA and/or kV according to patient size and/or use of iterative reconstruction technique. CONTRAST:  135m OMNIPAQUE IOHEXOL 300 MG/ML  SOLN COMPARISON:  CT with IV contrast 01/11/2006, CTA chest 02/19/2020. FINDINGS: Lower chest: Lung bases show scattered peripheral linear scarring with 2021 CTA chest demonstrating multifocal peripheral ground-glass infiltrates compatible with COVID pneumonia. In the posterior basal right lower lobe there is a 7 mm noncalcified rounded nodule associated with 1 of these linear scar-like  opacities. No active process or other abnormality is seen in the lung bases. The cardiac size is normal. There are calcifications in the coronary arteries and aortic valve. Hepatobiliary: The liver is steatotic and cirrhotic measuring 16 cm length. Cirrhosis was not seen on the 2007 exam but was clearly present in 2021. It was also noted on a chest CT in 2018. There is no liver mass. The gallbladder is contracted  with questionable wall thickening versus underdistention. There are no calcified stones pericholecystic edema or fluid. Pancreas: Mild fatty infiltration.  No mass or inflammatory change. Spleen: Mildly enlarged, 14.0 cm AP.  No mass enhancement. Adrenals/Urinary Tract: There is no adrenal mass. There is homogeneous enhancement in the bilateral renal cortex. There is no urinary stone or obstruction. There is mild thickening of the bladder versus underdistention. Stomach/Bowel: Small hiatal hernia. No dilatation or wall thickening including the retrocecal appendix. Moderate diffuse fecal retention. No findings of acute colitis or diverticulitis. Vascular/Lymphatic: The main portal vein slightly prominent 16.2 mm with a recanalized umbilical vein. There are no large varices in the abdomen or the pelvis and no abdominal wall varices. There is heavy aortoiliac calcific plaque with branch vessel involvement including high-grade calcific stenosis in the celiac artery and high-grade occlusive or near-occlusive stenosis in the proximal 1 cm of the SMA with reconstitution. Less severe calcific origin stenoses are suspected in both renal arteries. No lymphadenopathy is seen. Reproductive: Enlarged prostate with central calcifications measures 4.9 cm transversely with mild bladder impression. Other: There is trace ascites in the posterior deep pelvis. No abdominal ascites. No free air, abscess or free hemorrhage. Small bilateral inguinal fat hernias are again shown. Musculoskeletal: There are chronic L5 pars defects  with a minimal grade 1 L5-S1 spondylolisthesis without disc collapse. There are mild degenerative changes of the spine and hips. No aggressive bone lesion is seen. IMPRESSION: 1. There is mild generalized thickening of the bladder which could be due to nondistention, hypertrophy or cystitis. No other source of sepsis is suspected in the abdomen or the pelvis. 2. Contracted gallbladder with questionable mild wall thickening versus underdistention. No inflammatory change or calcified stones. 3. Cirrhotic and steatotic liver with mild splenomegaly, minimally prominent main portal vein and a recanalized umbilical vein with mild splenomegaly. There is only trace ascites. 4. Diffuse moderate stool retention throughout the large bowel. No bowel obstruction or inflammatory change. Diverticulosis. 5. Aortic and coronary artery atherosclerosis. 6. Small hiatal hernia.  Small inguinal fat hernias. 7. Aortic visceral branch arterial stenoses greatest in the SMA and celiac axis with a probable calcific origin stenosis of the IMA as well. 8. 7 mm noncalcified right lower lobe nodule in an area of post pneumonic scarring. Per Fleischner Society Guidelines, recommend a non-contrast Chest CT at 3-6 months. If patient is high risk for malignancy, recommend an additional non-contrast Chest CT at 18-24 months; if patient is low risk for malignancy a non-contrast Chest CT at 18-24 months is optional. These guidelines do not apply to immunocompromised patients and patients with cancer. Follow up in patients with significant comorbidities as clinically warranted. For lung cancer screening, adhere to Lung-RADS guidelines. Reference: Radiology. 2017; 284(1):228-43. 9. Prostatomegaly. Electronically Signed   By: Telford Nab M.D.   On: 09/25/2021 00:12   DG CHEST PORT 1 VIEW  Result Date: 09/24/2021 CLINICAL DATA:  Hypoxia. EXAM: PORTABLE CHEST 1 VIEW COMPARISON:  One-view chest x-ray 09/23/2021 FINDINGS: Heart is enlarged. No edema or  effusion is present. Mild interstitial prominence is stable. Cardiac surgery is noted. Aortic atherosclerosis noted. IMPRESSION: 1. Cardiomegaly without failure. 2. Stable interstitial prominence. Electronically Signed   By: San Morelle M.D.   On: 09/24/2021 12:45   DG CHEST PORT 1 VIEW  Result Date: 09/23/2021 CLINICAL DATA:  Shortness of breath EXAM: PORTABLE CHEST 1 VIEW COMPARISON:  Chest x-ray dated August 22, 2021 FINDINGS: Unchanged cardiomegaly. Prior aortic valve replacement and CABG. Increased mild bilateral interstitial opacities.  No large pleural effusion or pneumothorax. IMPRESSION: Increased mild bilateral interstitial opacities, likely due to pulmonary edema. Electronically Signed   By: Yetta Glassman M.D.   On: 09/23/2021 08:16   ECHOCARDIOGRAM COMPLETE  Result Date: 09/22/2021    ECHOCARDIOGRAM REPORT   Patient Name:   TERRANCE USERY Date of Exam: 09/22/2021 Medical Rec #:  [id number]     Height:       68.0 in Accession #:    [id number]    Weight:       188.4 lb Date of Birth:  May 18, 1950     BSA:          1.992 m Patient Age:    49 years      BP:           120/70 mmHg Patient Gender: M             HR:           78 bpm. Exam Location:  Inpatient Procedure: 2D Echo, Cardiac Doppler and Color Doppler Indications:    Congestive heart failure  History:        Patient has prior history of Echocardiogram examinations, most                 recent 04/29/2020. Diastolic HF, CAD, Prior CABG, Aortic Valve                 Disease, Arrythmias:Atrial Fibrillation; Risk                 Factors:Hypertension, Diabetes and HLD.  Sonographer:    Joette Catching RCS Referring Phys: [id number] [place]  1. Left ventricular ejection fraction, by estimation, is 50 to 55%. The left ventricle has low normal function. The left ventricle has no regional wall motion abnormalities. There is moderate concentric left ventricular hypertrophy. Left ventricular diastolic parameters are consistent with Grade II  diastolic dysfunction (pseudonormalization).  2. Right ventricular systolic function is normal. The right ventricular size is normal. There is normal pulmonary artery systolic pressure. The estimated right ventricular systolic pressure is 40.9 mmHg.  3. Left atrial size was moderately dilated.  4. The mitral valve is normal in structure. Trivial mitral valve regurgitation. No evidence of mitral stenosis.  5. Bioprosthetic aortic valve. Mean gradient 24 mmHg, elevated (higher than prior). DI 0.44, AVA 1.22 cm^2 by VTI. Visually, there appeared to be some limitation to aortic valve opening. No regurgitation.  6. The inferior vena cava is dilated in size with >50% respiratory variability, suggesting right atrial pressure of 8 mmHg. FINDINGS  Left Ventricle: Left ventricular ejection fraction, by estimation, is 50 to 55%. The left ventricle has low normal function. The left ventricle has no regional wall motion abnormalities. The left ventricular internal cavity size was normal in size. There is moderate concentric left ventricular hypertrophy. Left ventricular diastolic parameters are consistent with Grade II diastolic dysfunction (pseudonormalization). Right Ventricle: The right ventricular size is normal. No increase in right ventricular wall thickness. Right ventricular systolic function is normal. There is normal pulmonary artery systolic pressure. The tricuspid regurgitant velocity is 2.52 m/s, and  with an assumed right atrial pressure of 8 mmHg, the estimated right ventricular systolic pressure is 73.5 mmHg. Left Atrium: Left atrial size was moderately dilated. Right Atrium: Right atrial size was normal in size. Pericardium: There is no evidence of pericardial effusion. Mitral Valve: The mitral valve is normal in structure. Trivial mitral valve regurgitation. No evidence of mitral valve stenosis. Tricuspid Valve:  The tricuspid valve is normal in structure. Tricuspid valve regurgitation is trivial. Aortic Valve:  Bioprosthetic aortic valve. Mean gradient 24 mmHg, elevated (higher than prior). DI 0.44, AVA 1.22 cm^2 by VTI. Visually, there appeared to be some limitation to aortic valve opening. No regurgitation. The aortic valve has been repaired/replaced. Aortic valve regurgitation is not visualized. Aortic valve mean gradient measures 24.0 mmHg. Aortic valve peak gradient measures 40.2 mmHg. Aortic valve area, by VTI measures 1.26 cm. There is a bovine valve present in the aortic position. Pulmonic Valve: The pulmonic valve was normal in structure. Pulmonic valve regurgitation is trivial. Aorta: The aortic root is normal in size and structure. Venous: The inferior vena cava is dilated in size with greater than 50% respiratory variability, suggesting right atrial pressure of 8 mmHg. IAS/Shunts: No atrial level shunt detected by color flow Doppler.  LEFT VENTRICLE PLAX 2D LVIDd:         5.40 cm      Diastology LVIDs:         4.10 cm      LV e' medial:    4.38 cm/s LV PW:         1.40 cm      LV E/e' medial:  24.9 LV IVS:        1.20 cm      LV e' lateral:   7.80 cm/s LVOT diam:     1.90 cm      LV E/e' lateral: 14.0 LV SV:         88 LV SV Index:   44 LVOT Area:     2.84 cm  LV Volumes (MOD) LV vol d, MOD A2C: 64.0 ml LV vol d, MOD A4C: 126.0 ml LV vol s, MOD A2C: 41.7 ml LV vol s, MOD A4C: 52.0 ml LV SV MOD A2C:     22.3 ml LV SV MOD A4C:     126.0 ml LV SV MOD BP:      44.7 ml RIGHT VENTRICLE            IVC RV Basal diam:  3.70 cm    IVC diam: 2.60 cm RV Mid diam:    2.60 cm RV S prime:     9.90 cm/s TAPSE (M-mode): 0.9 cm LEFT ATRIUM             Index        RIGHT ATRIUM           Index LA diam:        5.60 cm 2.81 cm/m   RA Area:     16.90 cm LA Vol (A2C):   96.3 ml 48.33 ml/m  RA Volume:   39.70 ml  19.92 ml/m LA Vol (A4C):   88.1 ml 44.22 ml/m LA Biplane Vol: 95.0 ml 47.68 ml/m  AORTIC VALVE                     PULMONIC VALVE AV Area (Vmax):    1.23 cm      PV Vmax:          1.21 m/s AV Area (Vmean):   1.44 cm       PV Peak grad:     5.9 mmHg AV Area (VTI):     1.26 cm      PR End Diast Vel: 4.41 msec AV Vmax:           317.00 cm/s AV Vmean:  199.400 cm/s AV VTI:            0.698 m AV Peak Grad:      40.2 mmHg AV Mean Grad:      24.0 mmHg LVOT Vmax:         137.50 cm/s LVOT Vmean:        101.500 cm/s LVOT VTI:          0.310 m LVOT/AV VTI ratio: 0.44  AORTA Ao Root diam: 3.40 cm Ao Asc diam:  3.00 cm MITRAL VALVE                TRICUSPID VALVE MV Area (PHT): 3.95 cm     TR Peak grad:   25.4 mmHg MV Decel Time: 192 msec     TR Vmax:        252.00 cm/s MV E velocity: 109.00 cm/s MV A velocity: 79.30 cm/s   SHUNTS MV E/A ratio:  1.37         Systemic VTI:  0.31 m                             Systemic Diam: 1.90 cm Dalton McleanMD Electronically signed by Franki Monte Signature Date/Time: 09/22/2021/10:23:20 AM    Final    DG CHEST PORT 1 VIEW  Result Date: 09/21/2021 CLINICAL DATA:  Shortness of breath. EXAM: PORTABLE CHEST 1 VIEW COMPARISON:  Chest x-ray 11/08/2020. FINDINGS: The heart is mildly enlarged. Both lungs are clear. The visualized skeletal structures are unremarkable. IMPRESSION: No active disease. Electronically Signed   By: Ronney Asters M.D.   On: 09/21/2021 20:48      Discharge Instructions: Discharge Instructions     Diet - low sodium heart healthy   Complete by: As directed    Increase activity slowly   Complete by: As directed        Signed: Romana Juniper, MD [facility] - PGY1 Pager: [phone] 09/28/2021, 1:42 PM

## 2021-09-28 NOTE — Progress Notes (Signed)
Occupational Therapy Treatment Patient Details Name: Mark Cabrera MRN: [id number] DOB: [birth year] Today's Date: 09/28/2021   History of present illness 71 y.o. male presents to [facility] hospital on 09/21/2021 with 2 weeks of dizziness and malaise. Pt found to be hypotensive and with elevated A1c. Pt and daughter report progressive memory loss. PMH includes AKI, CHF, Afib, CAD s/p CABG, DM, GERD, HLD, HTN.   OT comments  Pt progressing to OOB level. Pt appears to be irritated easily. Pt open to getting OOB, but wanting to rest to conclude session instead of performing ADL tasks.  VSS. Pt's O2 on 4L, but displaying >94% on RA throughout session. O2 off, but near pt when needing it. Pt continues to benefit from continued OT skilled services. OT following acutely.   Recommendations for follow up therapy are one component of a multi-disciplinary discharge planning process, led by the attending physician.  Recommendations may be updated based on patient status, additional functional criteria and insurance authorization.    Follow Up Recommendations  Skilled nursing-short term rehab (<3 hours/day)    Assistance Recommended at Discharge Frequent or constant Supervision/Assistance  Patient can return home with the following  A little help with walking and/or transfers;A little help with bathing/dressing/bathroom;Assistance with cooking/housework;Direct supervision/assist for medications management;Direct supervision/assist for financial management;Assist for transportation;Help with stairs or ramp for entrance   Equipment Recommendations  None recommended by OT    Recommendations for Other Services      Precautions / Restrictions Precautions Precautions: Fall;Other (comment) Precaution Comments: poor memory, orthostatic Restrictions Weight Bearing Restrictions: No       Mobility Bed Mobility Overal bed mobility: Needs Assistance Bed Mobility: Supine to Sit     Supine to sit: Supervision, HOB  elevated     General bed mobility comments: use of rail    Transfers Overall transfer level: Needs assistance Equipment used: Rolling walker (2 wheels) Transfers: Sit to/from Stand, Bed to chair/wheelchair/BSC Sit to Stand: Min guard Stand pivot transfers: Min guard         General transfer comment: RW for mobility     Balance Overall balance assessment: Needs assistance Sitting-balance support: No upper extremity supported, Feet supported Sitting balance-Leahy Scale: Fair     Standing balance support: During functional activity, Bilateral upper extremity supported Standing balance-Leahy Scale: Poor                             ADL either performed or assessed with clinical judgement   ADL Overall ADL's : Needs assistance/impaired Eating/Feeding: Modified independent;Sitting   Grooming: Supervision/safety;Standing                               Functional mobility during ADLs: Min guard;Rolling walker (2 wheels) General ADL Comments: Pt refusing all ADL tasks at this time.    Extremity/Trunk Assessment Upper Extremity Assessment Upper Extremity Assessment: Overall WFL for tasks assessed   Lower Extremity Assessment Lower Extremity Assessment: Generalized weakness        Vision   Vision Assessment?: Vision impaired- to be further tested in functional context   Perception     Praxis      Cognition Arousal/Alertness: Lethargic, Awake/alert Behavior During Therapy: Flat affect Overall Cognitive Status: Impaired/Different from baseline Area of Impairment: Orientation, Attention, Memory, Following commands, Safety/judgement, Awareness                 Orientation Level: Disoriented  to, Time Current Attention Level: Sustained Memory: Decreased short-term memory Following Commands: Follows one step commands consistently, Follows multi-step commands inconsistently Safety/Judgement: Decreased awareness of safety, Decreased awareness  of deficits Awareness: Emergent Problem Solving: Requires verbal cues General Comments: Pt appears to be irritable easily. Pt open to getting OOB, but wanting to rest to conclude session instead of performing ADL tasks.        Exercises      Shoulder Instructions       General Comments VSS. Pt's O2 on 4L, but displaying >94% on RA throughout session. O2 off, but near pt when needing it.    Pertinent Vitals/ Pain       Pain Assessment Pain Assessment: Faces Faces Pain Scale: Hurts little more Pain Location: BLEs Pain Descriptors / Indicators: Sore, Discomfort Pain Intervention(s): Monitored during session  Home Living                                          Prior Functioning/Environment              Frequency  Min 2X/week        Progress Toward Goals  OT Goals(current goals can now be found in the care plan section)  Progress towards OT goals: Progressing toward goals  Acute Rehab OT Goals Patient Stated Goal: to go to rehab OT Goal Formulation: With patient Time For Goal Achievement: 10/06/21 Potential to Achieve Goals: [place] Discharge plan remains appropriate    Co-evaluation                 AM-PAC OT "6 Clicks" Daily Activity     Outcome Measure   Help from another person eating meals?: None Help from another person taking care of personal grooming?: A Little Help from another person toileting, which includes using toliet, bedpan, or urinal?: A Little Help from another person bathing (including washing, rinsing, drying)?: A Little Help from another person to put on and taking off regular upper body clothing?: A Little Help from another person to put on and taking off regular lower body clothing?: A Little 6 Click Score: 19    End of Session Equipment Utilized During Treatment: Gait belt;Rolling walker (2 wheels);Oxygen  OT Visit Diagnosis: Unsteadiness on feet (R26.81);Muscle weakness (generalized) (M62.81);Other  symptoms and signs involving cognitive function   Activity Tolerance Patient tolerated treatment well   Patient Left in chair;with call bell/phone within reach   Nurse Communication Mobility status        Time: 1123-1140 OT Time Calculation (min): 17 min  Charges: OT General Charges $OT Visit: 1 Visit OT Treatments $Therapeutic Activity: 8-22 mins  Jefferey Pica, OTR/L Acute Rehabilitation Services Office: [phone]   SAYTKZS W FUXNA 09/28/2021, 3:08 PM

## 2021-09-28 NOTE — Discharge Instructions (Signed)
Dear Mr. Reader,   You were admitted to hospital for hypotension and diabetes management. Your hypotension was treated with fluid rehydration and encouragement to keep eating your meals. Please continue to drink plenty of water and take your medications as prescribed.  You developed a fever during your hospitalization that was likely related to a viral illness. You were treated with Tylenol. You were also noted to have low blood cell counts that will need to be follow by your primary care provider.  While you were admitted, we completed imaging of your chest that showed a nodule in your right lower lobe and is recommended that you get repeat imaging in 3-6 months.  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   You are now stable and will be going to a skilled nursing facility, where you will be helped managing your medications and working with physical therapy to get stronger. You will be also scheduled to follow up with your primary care provider and cardiologist to manage your heart and blood sugar medications and follow up of your laboratory results after you have been discharged from the hospital.  Take care,  [facility] Training [facility] inpatient team

## 2021-09-28 NOTE — Progress Notes (Signed)
Report given to receiving nurse, Deberah Pelton, at [facility].

## 2021-09-29 ENCOUNTER — Other Ambulatory Visit: Payer: Self-pay | Admitting: *Deleted

## 2021-09-29 DIAGNOSIS — F32A Depression, unspecified: Secondary | ICD-10-CM | POA: Diagnosis not present

## 2021-09-29 DIAGNOSIS — K7460 Unspecified cirrhosis of liver: Secondary | ICD-10-CM | POA: Diagnosis not present

## 2021-09-29 DIAGNOSIS — R911 Solitary pulmonary nodule: Secondary | ICD-10-CM | POA: Diagnosis not present

## 2021-09-29 DIAGNOSIS — F4321 Adjustment disorder with depressed mood: Secondary | ICD-10-CM | POA: Diagnosis not present

## 2021-09-29 DIAGNOSIS — Z8673 Personal history of transient ischemic attack (TIA), and cerebral infarction without residual deficits: Secondary | ICD-10-CM | POA: Diagnosis not present

## 2021-09-29 DIAGNOSIS — R5381 Other malaise: Secondary | ICD-10-CM | POA: Diagnosis not present

## 2021-09-29 DIAGNOSIS — K5901 Slow transit constipation: Secondary | ICD-10-CM | POA: Diagnosis not present

## 2021-09-29 DIAGNOSIS — F03A0 Unspecified dementia, mild, without behavioral disturbance, psychotic disturbance, mood disturbance, and anxiety: Secondary | ICD-10-CM | POA: Diagnosis not present

## 2021-09-29 DIAGNOSIS — D61818 Other pancytopenia: Secondary | ICD-10-CM | POA: Diagnosis not present

## 2021-09-29 DIAGNOSIS — I4891 Unspecified atrial fibrillation: Secondary | ICD-10-CM | POA: Diagnosis not present

## 2021-09-29 DIAGNOSIS — B009 Herpesviral infection, unspecified: Secondary | ICD-10-CM | POA: Diagnosis not present

## 2021-09-29 DIAGNOSIS — Z794 Long term (current) use of insulin: Secondary | ICD-10-CM | POA: Diagnosis not present

## 2021-09-29 DIAGNOSIS — I5032 Chronic diastolic (congestive) heart failure: Secondary | ICD-10-CM | POA: Diagnosis not present

## 2021-09-29 DIAGNOSIS — I959 Hypotension, unspecified: Secondary | ICD-10-CM | POA: Diagnosis not present

## 2021-09-29 DIAGNOSIS — E118 Type 2 diabetes mellitus with unspecified complications: Secondary | ICD-10-CM | POA: Diagnosis not present

## 2021-09-29 DIAGNOSIS — J069 Acute upper respiratory infection, unspecified: Secondary | ICD-10-CM | POA: Diagnosis not present

## 2021-09-29 LAB — CULTURE, BLOOD (ROUTINE X 2)
Culture: NO GROWTH
Culture: NO GROWTH
Special Requests: ADEQUATE

## 2021-09-29 LAB — HCV AB W REFLEX TO QUANT PCR: HCV Ab: NONREACTIVE

## 2021-09-29 LAB — HCV INTERPRETATION

## 2021-09-29 NOTE — Patient Outreach (Signed)
Per [place] eligible member currently resides in [facility] and [place].  Screening for potential [facility] care coordination/care management services as a benefit of Mr. Cato insurance plan and PCP with [facility].   Mr. Townley admitted to SNF on 09/28/21 after hospitalization.  Facility site visit to [facility]. Met with Kathrynn Running, SNF social workers and Verdis Frederickson, Marketing executive. Winona Legato already aware of Mr. Meir living situation. He was essentially homeless per [place]. Mr. Seal is agreeable to remaining at [facility] for long term care. Will receive skilled therapy in the meantime.   Will continue to follow.    [name], MSN, RN,BSN [place] Acute Care Coordinator [phone] [facility]) [phone]  (Toll free office)

## 2021-10-03 ENCOUNTER — Telehealth: Payer: Self-pay | Admitting: *Deleted

## 2021-10-03 ENCOUNTER — Other Ambulatory Visit: Payer: Self-pay | Admitting: *Deleted

## 2021-10-03 ENCOUNTER — Encounter: Payer: Medicare Other | Admitting: Student

## 2021-10-03 DIAGNOSIS — I5032 Chronic diastolic (congestive) heart failure: Secondary | ICD-10-CM

## 2021-10-03 NOTE — Telephone Encounter (Signed)
Patient's friend Leo Grosser called stated that she picked up patient from [place].  Patient is now with her temporarily.  Patient gave permission to speak with her.  Patient has problems sleeping at night due to apnea.  Problems breathing at night.  Has to sit up in a chair.  Continues to be forgetful and goes out and does not close doors.  Ms. Sharol Roussel is requesting Palliative Care and oxygen for patient.  When asked if patient's daughter is aware of what is going on stated  she visited on yesterday.  Informed friend that patient would need to be evaluated for the Pallative Care as well as for the Oxygen.  Stated the facility where he was had what he needed.  Informed friend that patient would need to be reevaluated at an appointment to discuss and to determine his needs.   Discussed with Dr. Heber Hudson.  Patient given an appointment for this afternoon.  Call to patient's daughter who stated that Ms. Sharol Roussel has picked up her father from a Facility before.  Daughter will come to appointment with patient today.

## 2021-10-03 NOTE — Chronic Care Management (AMB) (Unsigned)
  Care Coordination  Note  10/03/2021 Name: LEA WALBERT MRN: [id number] DOB: 10-27-1950  Britt Bolognese Fritze is a 71 y.o. year old male who is a primary care patient of [name], MD. I reached out to [name] by phone today to offer care coordination services.       Follow up plan: Unsuccessful telephone outreach attempt made. A HIPAA compliant phone message was left for the patient providing contact information and requesting a return call.  The care guide will reach out to the patient again over the next 7 days.  If patient calls provider office to request assistance with care coordination needs, please contact the care guide at the number below.  [place]  Direct Dial: [phone]

## 2021-10-03 NOTE — Patient Outreach (Signed)
Standard Coordinator follow up.   Verified in [facility] Mark Cabrera discharged from [facility] on 10/01/21. Confirmed with Mark Cabrera, SNF SW that [name] left AMA. Mark Cabrera endorses making a report to APS this morning when she found out Mark Cabrera left AMA over the weekend. Advised Probation officer to contact daughter Mark Cabrera for details.   Telephone call made to Mark Cabrera daughter/DPR [phone]. Patient identifiers confirmed. Mark Cabrera confirms Mark Cabrera left [facility] AMA. States Mark Cabrera left [place] with a friend. States he is back in [place]. Mark Cabrera reports she is concerned about Mark Cabrera as he has not been making reasonable decisions as of late. States he has trouble remembering and this has been an ongoing issues. States she has been in contact with APS as well.  States she is trying to figure out how to gain guardianship. Mark Cabrera states she has contacted [facility] PCP office to make aware. Mark Cabrera interested in [place] [facility] social worker referral for guidance and assistance.   Telephone call made to Mark Cabrera [phone]. Patient identifiers confirmed. Mark Cabrera states he left [place] because " I could not handle it mentally. I did not like feeling trapped." States "there was nothing wrong with the facility." Mark Cabrera declined writer making [place] [facility] referral. States he is staying with a friend.   Called Mark Cabrera back to make aware Mark Cabrera declined [place] follow up. However, since Mark Cabrera is DPR, [facility] can reach out to her. Mark Cabrera confirms best contact number for her is [phone]. [facility] Care Management contact information and writer's contact information electronically to [facility]   Will make referral to [place] Management LCSW. Confirmed PCP is Mark Cabrera with [facility].    Mark Rolling, MSN, RN,BSN [place] Acute Care Coordinator [phone] [facility]) [phone]  (Toll free office)

## 2021-10-04 ENCOUNTER — Ambulatory Visit: Payer: Self-pay | Admitting: Licensed Clinical Social Worker

## 2021-10-04 NOTE — Chronic Care Management (AMB) (Signed)
  Care Coordination  Note  10/04/2021 Name: JAYCEON TROY MRN: [id number] DOB: 10-27-1950  Britt Bolognese Mollica is a 71 y.o. year old male who is a primary care patient of [name], MD. I reached out to [name] by phone today to offer care coordination services.      Mr. Easler was given information about Care Coordination services today including:  The Care Coordination services include support from the care team which includes your Nurse Coordinator, Clinical Social Worker, or Pharmacist.  The Care Coordination team is here to help remove barriers to the health concerns and goals most important to you. Care Coordination services are voluntary and the patient may decline or stop services at any time by request to their care team member.   [name] daughter DPR on file verbally agreed to assistance and services provided by the Care Coordination team today.  Follow up plan: Telephone appointment with care coordination team member scheduled for:10/04/21  [place]: [phone]

## 2021-10-04 NOTE — Patient Instructions (Signed)
Visit Information  Instructions:   Patient was given the following information about care management and care coordination services today, agreed to services, and gave verbal consent: 1.care management/care coordination services include personalized support from designated clinical staff supervised by their physician, including individualized plan of care and coordination with other care providers 2. 24/7 contact phone numbers for assistance for urgent and routine care needs. 3. The patient may stop care management/care coordination services at any time by phone call to the office staff.  Patient verbalizes understanding of instructions and care plan provided today and agrees to view in [place]. Active MyChart status and patient understanding of how to access instructions and care plan via MyChart confirmed with patient.     No further follow up needed at this time.  Milus Height, [place]  Social Worker [facility] Care Management  [phone]

## 2021-10-04 NOTE — Patient Outreach (Signed)
  Care Coordination   Initial Visit Note   10/04/2021 Name: Mark Cabrera MRN: [id number] DOB: 23-Jan-1951  Britt Bolognese Watling is a 71 y.o. year old male who sees [name], MD for primary care.SW spoke with Daughter [name]) on today. [name] has concerns regarding patient and ability to take care of himself.     [name] has contacted DSS APS who declined services for patient due to unsubstantiated evidence.  [name] has attempted to have him deemed incompetent, but unsuccessful.  [name] is debating if she will push for guardianship. SW educated her on the steps.   SW explained SW has no additionla resources due to patient not wanting to participate. SW advised patient to reach back out to provider if additional assistance is needed in the future.   What matters to the patients health and wellness today? Living conditions  SDOH assessments and interventions completed:   Yes   Care Coordination Interventions Activated:  Yes Care Coordination Interventions:  Yes, provided  Follow up plan: No further intervention required.  Encounter Outcome:  Pt. Visit Completed  Lenor Derrick, MSW  Social Worker [facility] Care Management  [phone]

## 2021-10-05 ENCOUNTER — Telehealth: Payer: Self-pay | Admitting: *Deleted

## 2021-10-05 NOTE — Telephone Encounter (Signed)
Received call from Ammie Ferrier, SW with DSS. States APS has been involved since patient left facility AMA. He is currently living with his friend, Verlin Grills. [name] is in the home now. Patient is eating, and in no apparent distress. He told her at night he has difficulty breathing and feels throat swelling. States he wants oxygen for night time only. He told her he is able to dial 911 if he feels he needs to. He has made appt for tomorrow at 1315. States his daughter will not be coming to this appt.

## 2021-10-06 ENCOUNTER — Encounter: Payer: Medicare Other | Admitting: Student

## 2021-12-12 ENCOUNTER — Telehealth: Payer: Self-pay | Admitting: *Deleted

## 2021-12-12 NOTE — Telephone Encounter (Signed)
Patient's daughter called in requesting an FL2 form be completed. Last form completed 7/13 by outside Provider. They are only good for 30 days. Last OV 7/12 with 2 no shows since then. Please advise if patient will need OV in order to complete form.

## 2021-12-13 NOTE — Telephone Encounter (Signed)
Adding blue team

## 2021-12-13 NOTE — Telephone Encounter (Signed)
I think we can handle this over the phone.  Do we have a copy of the last one?  Please put in my box and I will take care of it this week.

## 2021-12-15 NOTE — Telephone Encounter (Signed)
Pt's daughter calling back requesting FL2 form to be completed by today. States this urgent. Please call her back.

## 2021-12-15 NOTE — Telephone Encounter (Signed)
Spoke with Mark Cabrera.  Filled out FL2 and given to [facility].   [name], MD

## 2021-12-29 ENCOUNTER — Telehealth: Payer: Self-pay | Admitting: *Deleted

## 2021-12-29 NOTE — Telephone Encounter (Signed)
Patient's daughter called in requesting appt for tomorrow. States patient is telling her that he is bed bound, in pain, and has an infection on his penis. She thinks he may need to be hospitalized. There are no openings in [facility] till end of October. She is advised to take him to ED. She agrees. She also scheduled first available appt for 10/31.

## 2022-01-10 ENCOUNTER — Telehealth: Payer: Self-pay

## 2022-01-10 ENCOUNTER — Encounter: Payer: Medicare Other | Admitting: Student

## 2022-01-10 NOTE — Telephone Encounter (Signed)
Called Tranquilino Fischler 10/31 3:25PM, left voicemail to return call.

## 2022-01-10 NOTE — Telephone Encounter (Signed)
Patients daughter [name] called she stated she can not get the patient to go to any of his appointments- patient cancelled his appointment that was scheduled for this afternoon. [name] is requesting a call back to discuss further.

## 2022-01-16 ENCOUNTER — Telehealth: Payer: Self-pay

## 2022-01-16 NOTE — Telephone Encounter (Signed)
Pt's daughter requesting to speak with Dr. Collene Gobble. States they're requesting new FL2 form to be complete. Please call back.

## 2022-01-17 ENCOUNTER — Encounter (HOSPITAL_COMMUNITY): Payer: Self-pay

## 2022-01-17 ENCOUNTER — Encounter: Payer: Self-pay | Admitting: Student

## 2022-01-17 ENCOUNTER — Other Ambulatory Visit: Payer: Self-pay

## 2022-01-17 ENCOUNTER — Inpatient Hospital Stay (HOSPITAL_COMMUNITY)
Admission: EM | Admit: 2022-01-17 | Discharge: 2022-01-25 | DRG: 638 | Disposition: A | Discharge status: Skilled Nursing Facility | Payer: Medicare Other | Attending: Internal Medicine | Admitting: Internal Medicine

## 2022-01-17 ENCOUNTER — Telehealth: Payer: Self-pay

## 2022-01-17 ENCOUNTER — Ambulatory Visit (INDEPENDENT_AMBULATORY_CARE_PROVIDER_SITE_OTHER): Payer: Medicare Other | Admitting: Student

## 2022-01-17 ENCOUNTER — Emergency Department (HOSPITAL_COMMUNITY): Payer: Medicare Other

## 2022-01-17 VITALS — BP 119/91 | HR 61 | Temp 98.6°F | Ht 68.0 in | Wt 171.7 lb

## 2022-01-17 DIAGNOSIS — M6281 Muscle weakness (generalized): Secondary | ICD-10-CM | POA: Diagnosis not present

## 2022-01-17 DIAGNOSIS — R739 Hyperglycemia, unspecified: Secondary | ICD-10-CM | POA: Diagnosis present

## 2022-01-17 DIAGNOSIS — G8929 Other chronic pain: Secondary | ICD-10-CM | POA: Diagnosis present

## 2022-01-17 DIAGNOSIS — R296 Repeated falls: Secondary | ICD-10-CM | POA: Diagnosis present

## 2022-01-17 DIAGNOSIS — Z66 Do not resuscitate: Secondary | ICD-10-CM | POA: Diagnosis present

## 2022-01-17 DIAGNOSIS — Z1152 Encounter for screening for COVID-19: Secondary | ICD-10-CM | POA: Diagnosis not present

## 2022-01-17 DIAGNOSIS — Z7984 Long term (current) use of oral hypoglycemic drugs: Secondary | ICD-10-CM

## 2022-01-17 DIAGNOSIS — E1149 Type 2 diabetes mellitus with other diabetic neurological complication: Secondary | ICD-10-CM | POA: Diagnosis not present

## 2022-01-17 DIAGNOSIS — Z885 Allergy status to narcotic agent status: Secondary | ICD-10-CM | POA: Diagnosis not present

## 2022-01-17 DIAGNOSIS — B3742 Candidal balanitis: Secondary | ICD-10-CM | POA: Diagnosis not present

## 2022-01-17 DIAGNOSIS — I480 Paroxysmal atrial fibrillation: Secondary | ICD-10-CM | POA: Diagnosis present

## 2022-01-17 DIAGNOSIS — L8990 Pressure ulcer of unspecified site, unspecified stage: Secondary | ICD-10-CM | POA: Insufficient documentation

## 2022-01-17 DIAGNOSIS — N179 Acute kidney failure, unspecified: Secondary | ICD-10-CM | POA: Diagnosis present

## 2022-01-17 DIAGNOSIS — I5042 Chronic combined systolic (congestive) and diastolic (congestive) heart failure: Secondary | ICD-10-CM | POA: Diagnosis present

## 2022-01-17 DIAGNOSIS — Z880 Allergy status to penicillin: Secondary | ICD-10-CM

## 2022-01-17 DIAGNOSIS — E1165 Type 2 diabetes mellitus with hyperglycemia: Secondary | ICD-10-CM | POA: Diagnosis present

## 2022-01-17 DIAGNOSIS — Z952 Presence of prosthetic heart valve: Secondary | ICD-10-CM

## 2022-01-17 DIAGNOSIS — K760 Fatty (change of) liver, not elsewhere classified: Secondary | ICD-10-CM | POA: Diagnosis not present

## 2022-01-17 DIAGNOSIS — I110 Hypertensive heart disease with heart failure: Secondary | ICD-10-CM | POA: Diagnosis not present

## 2022-01-17 DIAGNOSIS — Z951 Presence of aortocoronary bypass graft: Secondary | ICD-10-CM

## 2022-01-17 DIAGNOSIS — G3184 Mild cognitive impairment, so stated: Secondary | ICD-10-CM

## 2022-01-17 DIAGNOSIS — E871 Hypo-osmolality and hyponatremia: Secondary | ICD-10-CM | POA: Diagnosis present

## 2022-01-17 DIAGNOSIS — I2510 Atherosclerotic heart disease of native coronary artery without angina pectoris: Secondary | ICD-10-CM | POA: Diagnosis present

## 2022-01-17 DIAGNOSIS — R2689 Other abnormalities of gait and mobility: Secondary | ICD-10-CM | POA: Diagnosis not present

## 2022-01-17 DIAGNOSIS — E785 Hyperlipidemia, unspecified: Secondary | ICD-10-CM | POA: Diagnosis present

## 2022-01-17 DIAGNOSIS — F0390 Unspecified dementia without behavioral disturbance: Secondary | ICD-10-CM | POA: Diagnosis present

## 2022-01-17 DIAGNOSIS — D638 Anemia in other chronic diseases classified elsewhere: Secondary | ICD-10-CM | POA: Diagnosis present

## 2022-01-17 DIAGNOSIS — R9431 Abnormal electrocardiogram [ECG] [EKG]: Secondary | ICD-10-CM | POA: Diagnosis not present

## 2022-01-17 DIAGNOSIS — R293 Abnormal posture: Secondary | ICD-10-CM | POA: Diagnosis not present

## 2022-01-17 DIAGNOSIS — R509 Fever, unspecified: Secondary | ICD-10-CM | POA: Diagnosis not present

## 2022-01-17 DIAGNOSIS — R6251 Failure to thrive (child): Secondary | ICD-10-CM

## 2022-01-17 DIAGNOSIS — R197 Diarrhea, unspecified: Secondary | ICD-10-CM | POA: Diagnosis present

## 2022-01-17 DIAGNOSIS — Z7901 Long term (current) use of anticoagulants: Secondary | ICD-10-CM

## 2022-01-17 DIAGNOSIS — Z888 Allergy status to other drugs, medicaments and biological substances status: Secondary | ICD-10-CM

## 2022-01-17 DIAGNOSIS — R5383 Other fatigue: Secondary | ICD-10-CM | POA: Diagnosis not present

## 2022-01-17 DIAGNOSIS — K7469 Other cirrhosis of liver: Secondary | ICD-10-CM | POA: Diagnosis not present

## 2022-01-17 DIAGNOSIS — R4182 Altered mental status, unspecified: Secondary | ICD-10-CM | POA: Diagnosis not present

## 2022-01-17 DIAGNOSIS — Z79899 Other long term (current) drug therapy: Secondary | ICD-10-CM | POA: Diagnosis not present

## 2022-01-17 DIAGNOSIS — B372 Candidiasis of skin and nail: Secondary | ICD-10-CM

## 2022-01-17 DIAGNOSIS — F32A Depression, unspecified: Secondary | ICD-10-CM | POA: Diagnosis present

## 2022-01-17 DIAGNOSIS — Z6826 Body mass index (BMI) 26.0-26.9, adult: Secondary | ICD-10-CM

## 2022-01-17 DIAGNOSIS — G4730 Sleep apnea, unspecified: Secondary | ICD-10-CM | POA: Diagnosis present

## 2022-01-17 DIAGNOSIS — E876 Hypokalemia: Secondary | ICD-10-CM | POA: Diagnosis not present

## 2022-01-17 DIAGNOSIS — Z0000 Encounter for general adult medical examination without abnormal findings: Secondary | ICD-10-CM | POA: Diagnosis not present

## 2022-01-17 DIAGNOSIS — E860 Dehydration: Secondary | ICD-10-CM | POA: Diagnosis not present

## 2022-01-17 DIAGNOSIS — Z794 Long term (current) use of insulin: Secondary | ICD-10-CM | POA: Diagnosis not present

## 2022-01-17 DIAGNOSIS — K7460 Unspecified cirrhosis of liver: Secondary | ICD-10-CM | POA: Diagnosis present

## 2022-01-17 DIAGNOSIS — E1100 Type 2 diabetes mellitus with hyperosmolarity without nonketotic hyperglycemic-hyperosmolar coma (NKHHC): Principal | ICD-10-CM

## 2022-01-17 DIAGNOSIS — E1140 Type 2 diabetes mellitus with diabetic neuropathy, unspecified: Secondary | ICD-10-CM | POA: Diagnosis not present

## 2022-01-17 DIAGNOSIS — E1142 Type 2 diabetes mellitus with diabetic polyneuropathy: Secondary | ICD-10-CM

## 2022-01-17 DIAGNOSIS — Z91148 Patient's other noncompliance with medication regimen for other reason: Secondary | ICD-10-CM

## 2022-01-17 DIAGNOSIS — R410 Disorientation, unspecified: Secondary | ICD-10-CM | POA: Diagnosis not present

## 2022-01-17 DIAGNOSIS — A6002 Herpesviral infection of other male genital organs: Secondary | ICD-10-CM | POA: Diagnosis present

## 2022-01-17 DIAGNOSIS — R7989 Other specified abnormal findings of blood chemistry: Secondary | ICD-10-CM

## 2022-01-17 DIAGNOSIS — R627 Adult failure to thrive: Secondary | ICD-10-CM | POA: Diagnosis present

## 2022-01-17 DIAGNOSIS — I472 Ventricular tachycardia, unspecified: Secondary | ICD-10-CM | POA: Diagnosis not present

## 2022-01-17 DIAGNOSIS — Z8673 Personal history of transient ischemic attack (TIA), and cerebral infarction without residual deficits: Secondary | ICD-10-CM

## 2022-01-17 LAB — URINALYSIS, ROUTINE W REFLEX MICROSCOPIC
Bacteria, UA: NONE SEEN
Bilirubin Urine: NEGATIVE
Glucose, UA: >=500 mg/dL — AB
Hgb urine dipstick: NEGATIVE
Ketones, ur: 20 mg/dL — AB
Leukocytes,Ua: NEGATIVE
Nitrite: NEGATIVE
Protein, ur: NEGATIVE mg/dL
Specific Gravity, Urine: 1.026 (ref 1.005–1.030)
pH: 5 (ref 5.0–8.0)

## 2022-01-17 LAB — CBC WITH DIFFERENTIAL/PLATELET
Abs Immature Granulocytes: 0.07 10*3/uL (ref 0.00–0.07)
Abs Immature Granulocytes: 0.05 10*3/uL (ref 0.00–0.07)
Basophils Absolute: 0 10*3/uL (ref 0.0–0.1)
Basophils Absolute: 0 10*3/uL (ref 0.0–0.1)
Basophils Relative: 1 %
Basophils Relative: 0 %
Eosinophils Absolute: 0 10*3/uL (ref 0.0–0.5)
Eosinophils Absolute: 0 10*3/uL (ref 0.0–0.5)
Eosinophils Relative: 1 %
Eosinophils Relative: 0 %
HCT: 30 % — ABNORMAL LOW (ref 39.0–52.0)
HCT: 31.2 % — ABNORMAL LOW (ref 39.0–52.0)
Hemoglobin: 10.7 g/dL — ABNORMAL LOW (ref 13.0–17.0)
Hemoglobin: 10.9 g/dL — ABNORMAL LOW (ref 13.0–17.0)
Immature Granulocytes: 2 %
Immature Granulocytes: 2 %
Lymphocytes Relative: 20 %
Lymphocytes Relative: 28 %
Lymphs Abs: 0.7 10*3/uL (ref 0.7–4.0)
Lymphs Abs: 0.8 10*3/uL (ref 0.7–4.0)
MCH: 31.5 pg (ref 26.0–34.0)
MCH: 31.2 pg (ref 26.0–34.0)
MCHC: 35.7 g/dL (ref 30.0–36.0)
MCHC: 34.9 g/dL (ref 30.0–36.0)
MCV: 88.2 fL (ref 80.0–100.0)
MCV: 89.4 fL (ref 80.0–100.0)
Monocytes Absolute: 0.2 10*3/uL (ref 0.1–1.0)
Monocytes Absolute: 0.3 10*3/uL (ref 0.1–1.0)
Monocytes Relative: 7 %
Monocytes Relative: 10 %
Neutro Abs: 2.4 10*3/uL (ref 1.7–7.7)
Neutro Abs: 1.7 10*3/uL (ref 1.7–7.7)
Neutrophils Relative %: 69 %
Neutrophils Relative %: 60 %
Platelets: 244 10*3/uL (ref 150–400)
Platelets: 189 10*3/uL (ref 150–400)
RBC: 3.4 MIL/uL — ABNORMAL LOW (ref 4.22–5.81)
RBC: 3.49 MIL/uL — ABNORMAL LOW (ref 4.22–5.81)
RDW: 14.3 % (ref 11.5–15.5)
RDW: 14.1 % (ref 11.5–15.5)
WBC: 3.4 10*3/uL — ABNORMAL LOW (ref 4.0–10.5)
WBC: 2.7 10*3/uL — ABNORMAL LOW (ref 4.0–10.5)
nRBC: 0 % (ref 0.0–0.2)
nRBC: 0 % (ref 0.0–0.2)

## 2022-01-17 LAB — POCT GLYCOSYLATED HEMOGLOBIN (HGB A1C): HbA1c POC (<> result, manual entry): >14 % (ref 4.0–5.6)

## 2022-01-17 LAB — CBG MONITORING, ED
Glucose-Capillary: >600 mg/dL (ref 70–99)
Glucose-Capillary: 594 mg/dL (ref 70–99)
Glucose-Capillary: 599 mg/dL (ref 70–99)
Glucose-Capillary: 434 mg/dL — ABNORMAL HIGH (ref 70–99)
Glucose-Capillary: 356 mg/dL — ABNORMAL HIGH (ref 70–99)

## 2022-01-17 LAB — COMPREHENSIVE METABOLIC PANEL
ALT: 14 U/L (ref 0–44)
ALT: 15 U/L (ref 0–44)
AST: 16 U/L (ref 15–41)
AST: 17 U/L (ref 15–41)
Albumin: 3 g/dL — ABNORMAL LOW (ref 3.5–5.0)
Albumin: 2.8 g/dL — ABNORMAL LOW (ref 3.5–5.0)
Alkaline Phosphatase: 82 U/L (ref 38–126)
Alkaline Phosphatase: 74 U/L (ref 38–126)
Anion gap: 15 (ref 5–15)
Anion gap: 14 (ref 5–15)
BUN: 17 mg/dL (ref 8–23)
BUN: 20 mg/dL (ref 8–23)
CO2: 19 mmol/L — ABNORMAL LOW (ref 22–32)
CO2: 19 mmol/L — ABNORMAL LOW (ref 22–32)
Calcium: 9 mg/dL (ref 8.9–10.3)
Calcium: 8.8 mg/dL — ABNORMAL LOW (ref 8.9–10.3)
Chloride: 89 mmol/L — ABNORMAL LOW (ref 98–111)
Chloride: 91 mmol/L — ABNORMAL LOW (ref 98–111)
Creatinine, Ser: 1.32 mg/dL — ABNORMAL HIGH (ref 0.61–1.24)
Creatinine, Ser: 1.27 mg/dL — ABNORMAL HIGH (ref 0.61–1.24)
GFR, Estimated: 57 mL/min — ABNORMAL LOW (ref >60)
GFR, Estimated: 60 mL/min — ABNORMAL LOW (ref >60)
Glucose, Bld: 673 mg/dL (ref 70–99)
Glucose, Bld: 676 mg/dL (ref 70–99)
Potassium: 4 mmol/L (ref 3.5–5.1)
Potassium: 4 mmol/L (ref 3.5–5.1)
Sodium: 123 mmol/L — ABNORMAL LOW (ref 135–145)
Sodium: 124 mmol/L — ABNORMAL LOW (ref 135–145)
Total Bilirubin: 1.2 mg/dL (ref 0.3–1.2)
Total Bilirubin: 0.7 mg/dL (ref 0.3–1.2)
Total Protein: 7 g/dL (ref 6.5–8.1)
Total Protein: 6.4 g/dL — ABNORMAL LOW (ref 6.5–8.1)

## 2022-01-17 LAB — I-STAT CHEM 8, ED
BUN: 19 mg/dL (ref 8–23)
Calcium, Ion: 1.06 mmol/L — ABNORMAL LOW (ref 1.15–1.40)
Chloride: 92 mmol/L — ABNORMAL LOW (ref 98–111)
Creatinine, Ser: 1 mg/dL (ref 0.61–1.24)
Glucose, Bld: 669 mg/dL (ref 70–99)
HCT: 33 % — ABNORMAL LOW (ref 39.0–52.0)
Hemoglobin: 11.2 g/dL — ABNORMAL LOW (ref 13.0–17.0)
Potassium: 4 mmol/L (ref 3.5–5.1)
Sodium: 124 mmol/L — ABNORMAL LOW (ref 135–145)
TCO2: 21 mmol/L — ABNORMAL LOW (ref 22–32)

## 2022-01-17 LAB — SARS CORONAVIRUS 2 BY RT PCR: SARS Coronavirus 2 by RT PCR: NEGATIVE

## 2022-01-17 LAB — GLUCOSE, CAPILLARY
Glucose-Capillary: >600 mg/dL (ref 70–99)
Glucose-Capillary: >600 mg/dL (ref 70–99)

## 2022-01-17 LAB — I-STAT VENOUS BLOOD GAS, ED
Acid-base deficit: 3 mmol/L — ABNORMAL HIGH (ref 0.0–2.0)
Bicarbonate: 20.4 mmol/L (ref 20.0–28.0)
Calcium, Ion: 1.08 mmol/L — ABNORMAL LOW (ref 1.15–1.40)
HCT: 33 % — ABNORMAL LOW (ref 39.0–52.0)
Hemoglobin: 11.2 g/dL — ABNORMAL LOW (ref 13.0–17.0)
O2 Saturation: 46 %
Potassium: 4 mmol/L (ref 3.5–5.1)
Sodium: 124 mmol/L — ABNORMAL LOW (ref 135–145)
TCO2: 21 mmol/L — ABNORMAL LOW (ref 22–32)
pCO2, Ven: 30.2 mmHg — ABNORMAL LOW (ref 44–60)
pH, Ven: 7.436 — ABNORMAL HIGH (ref 7.25–7.43)
pO2, Ven: 24 mmHg — CL (ref 32–45)

## 2022-01-17 LAB — BETA-HYDROXYBUTYRIC ACID
Beta-Hydroxybutyric Acid: 1.93 mmol/L — ABNORMAL HIGH (ref 0.05–0.27)
Beta-Hydroxybutyric Acid: 1.52 mmol/L — ABNORMAL HIGH (ref 0.05–0.27)

## 2022-01-17 LAB — LIPASE, BLOOD: Lipase: 32 U/L (ref 11–51)

## 2022-01-17 LAB — POC OCCULT BLOOD, ED: Fecal Occult Bld: NEGATIVE

## 2022-01-17 MED ORDER — SODIUM CHLORIDE 0.9 % IV BOLUS
500.0000 mL | Freq: Once | INTRAVENOUS | Status: AC
  Administered 2022-01-17: 500 mL via INTRAVENOUS

## 2022-01-17 MED ORDER — LACTATED RINGERS IV BOLUS
1000.0000 mL | INTRAVENOUS | Status: AC
  Administered 2022-01-17: 1000 mL via INTRAVENOUS

## 2022-01-17 MED ORDER — DEXTROSE 50 % IV SOLN: 0.0000 mL | INTRAVENOUS | Status: DC | PRN

## 2022-01-17 MED ORDER — DEXTROSE IN LACTATED RINGERS 5 % IV SOLN: INTRAVENOUS | Status: DC

## 2022-01-17 MED ORDER — INSULIN REGULAR(HUMAN) IN NACL 100-0.9 UT/100ML-% IV SOLN
INTRAVENOUS | Status: DC
  Administered 2022-01-17: 13 [IU]/h via INTRAVENOUS
  Administered 2022-01-18: 6 [IU]/h via INTRAVENOUS
  Filled 2022-01-17 (×2): qty 100

## 2022-01-17 MED ORDER — POTASSIUM CHLORIDE 10 MEQ/100ML IV SOLN
10.0000 meq | INTRAVENOUS | Status: AC
  Administered 2022-01-17 (×2): 10 meq via INTRAVENOUS
  Filled 2022-01-17 (×2): qty 100

## 2022-01-17 MED ORDER — INSULIN ASPART 100 UNIT/ML IJ SOLN
8.0000 [IU] | Freq: Once | INTRAMUSCULAR | Status: AC
  Administered 2022-01-17: 8 [IU] via SUBCUTANEOUS

## 2022-01-17 MED ORDER — LACTATED RINGERS IV SOLN: INTRAVENOUS | Status: DC

## 2022-01-17 MED ORDER — NYSTATIN 100000 UNIT/GM EX POWD
Freq: Once | CUTANEOUS | Status: AC
  Filled 2022-01-17: qty 15

## 2022-01-17 MED ORDER — IOHEXOL 350 MG/ML SOLN
70.0000 mL | Freq: Once | INTRAVENOUS | Status: AC | PRN
  Administered 2022-01-17: 70 mL via INTRAVENOUS

## 2022-01-17 MED ORDER — ENOXAPARIN SODIUM 40 MG/0.4ML IJ SOSY: 40.0000 mg | PREFILLED_SYRINGE | INTRAMUSCULAR | Status: DC

## 2022-01-17 NOTE — Progress Notes (Signed)
Established Patient Office Visit  Subjective   Patient ID: Mark Cabrera, male    DOB: 26-Nov-1950  Age: 71 y.o. MRN: [id number]  Chief Complaint  Patient presents with   Diarrhea   Back Pain   Hip Pain   Genital problem    Head of penis is raw and peeling presenting pain for about 1 month   Leg Cramping    Mark Cabrera presents today with daughter for follow up of diabetes. Also with multiple complaints of diarrhea, left hip pain, fevers, penile irritation, and falls at home. Daughter reports he has been very difficult to get him to appointments, and patient has been very irritable lately. Daughter recently became patient's legal gaurdian due to his poor memory. Please refer to problem based charting for further details and assessment and plan of current problem and chronic medical conditions.     Patient Active Problem List   Diagnosis Date Noted   Neutropenia ([place])    Fever, unspecified    Malnutrition of moderate degree 09/23/2021   Physical deconditioning 09/21/2021   Otitis externa 12/31/2020   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   Balanitis 99/37/1696   Complicated urinary tract infection    Thrombocytopenia ([facility])    Hyponatremia 11/08/2020   Left shoulder pain 10/20/2020   Dyspnea 09/23/2020   Mild cognitive impairment 08/25/2020   Hip pain, chronic, left 07/05/2020   Epididymitis 04/14/2020   Fatigue 03/18/2020   Hypotension 03/10/2020   Abdominal wall bulge 12/03/2019   Dermoid inclusion cyst 08/07/2019   Chronic left shoulder pain 05/13/2019   Depression 11/05/2018   Blurry vision, bilateral 09/20/2018   CVA (cerebral vascular accident) (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  Respiratory:  Negative for shortness of breath.   Cardiovascular:  Negative for chest pain.  Gastrointestinal:  Positive for diarrhea.  Genitourinary:  Negative for dysuria and frequency.  Musculoskeletal:  Positive for joint pain and myalgias.       Objective:     BP (!) 119/91 (BP Location: Left Arm, Patient Position: Sitting, Cuff Size: Normal)   Pulse 61   Temp 98.6 F (37 C) (Oral)   Ht '5\' 8"'$  (1.727 m)   Wt 171 lb 11.2 oz (77.9 kg)   SpO2 96%   BMI 26.11 kg/m  BP Readings from Last 3 Encounters:  01/17/22 (!) 141/71  01/17/22 (!) 119/91  09/28/21 121/72      Physical Exam Constitutional:      Comments: Chronically ill appearing, lifting forward in wheel chair, had to be moved to reclining on exam bed  HENT:     Head: Normocephalic and atraumatic.     Comments: Multiple eschar appears to be from prior excoriations  Eyes:     Extraocular Movements: Extraocular movements intact.     Conjunctiva/sclera: Conjunctivae normal.  Pupils: Pupils are equal, round, and reactive to light.  Cardiovascular:     Rate and Rhythm: Normal rate and regular rhythm.  Pulmonary:     Effort: Pulmonary effort is normal.     Breath sounds: Normal breath sounds. No wheezing, rhonchi or rales.  Abdominal:     General: Abdomen is flat. Bowel sounds are normal.     Palpations: Abdomen is soft.  Musculoskeletal:     Right lower leg: No edema.     Left lower leg: No edema.  Skin:    General: Skin is warm and dry.  Neurological:     Mental Status: He is alert.     Comments: Oriented to self, place,  situation, lethargic  Psychiatric:     Comments: Irritable, normal affect      Results for orders placed or performed during the hospital encounter of 01/17/22  CBG monitoring, ED  Result Value Ref Range   Glucose-Capillary >600 (HH) 70 - 99 mg/dL  Results for orders placed or performed in visit on 01/17/22  Glucose, capillary  Result Value Ref Range   Glucose-Capillary >600 (HH) 70 - 99 mg/dL  Glucose, capillary  Result Value Ref Range   Glucose-Capillary >600 (HH) 70 - 99 mg/dL  Beta-hydroxybutyric acid  Result Value Ref Range   Beta-Hydroxybutyric Acid 1.93 (H) 0.05 - 0.27 mmol/L  CMP w Anion Gap (STAT/Sunquest-performed on-site)  Result Value Ref Range   Sodium 123 (L) 135 - 145 mmol/L   Potassium 4.0 3.5 - 5.1 mmol/L   Chloride 89 (L) 98 - 111 mmol/L   CO2 19 (L) 22 - 32 mmol/L   Glucose, Bld 673 (HH) 70 - 99 mg/dL   BUN 17 8 - 23 mg/dL   Creatinine, Ser 1.32 (H) 0.61 - 1.24 mg/dL   Calcium 9.0 8.9 - 10.3 mg/dL   Total Protein 7.0 6.5 - 8.1 g/dL   Albumin 3.0 (L) 3.5 - 5.0 g/dL   AST 16 15 - 41 U/L   ALT 14 0 - 44 U/L   Alkaline Phosphatase 82 38 - 126 U/L   Total Bilirubin 1.2 0.3 - 1.2 mg/dL   GFR, Estimated 57 (L) >60 mL/min   Anion gap 15 5 - 15  CBC with Diff  Result Value Ref Range   WBC 3.4 (L) 4.0 - 10.5 K/uL   RBC 3.40 (L) 4.22 - 5.81 MIL/uL   Hemoglobin 10.7 (L) 13.0 - 17.0 g/dL   HCT 30.0 (L) 39.0 - 52.0 %   MCV 88.2 80.0 - 100.0 fL   MCH 31.5 26.0 - 34.0 pg   MCHC 35.7 30.0 - 36.0 g/dL   RDW 14.3 11.5 - 15.5 %   Platelets 244 150 - 400 K/uL   nRBC 0.0 0.0 - 0.2 %   Neutrophils Relative % 69 %   Neutro Abs 2.4 1.7 - 7.7 K/uL   Lymphocytes Relative 20 %   Lymphs Abs 0.7 0.7 - 4.0 K/uL   Monocytes Relative 7 %   Monocytes Absolute 0.2 0.1 - 1.0 K/uL   Eosinophils Relative 1 %   Eosinophils Absolute 0.0 0.0 - 0.5 K/uL   Basophils Relative 1 %   Basophils Absolute 0.0 0.0 - 0.1 K/uL   Immature Granulocytes 2 %   Abs Immature Granulocytes 0.07  0.00 - 0.07 K/uL  POC Hbg A1C  Result Value Ref Range   Hemoglobin A1C     HbA1c POC (<> result, manual entry) >14.0 4.0 - 5.6 %   HbA1c,  POC (prediabetic range)     HbA1c, POC (controlled diabetic range)      Last CBC Lab Results  Component Value Date   WBC 3.4 (L) 01/17/2022   HGB 10.7 (L) 01/17/2022   HCT 30.0 (L) 01/17/2022   MCV 88.2 01/17/2022   MCH 31.5 01/17/2022   RDW 14.3 01/17/2022   PLT 244 68/05/2120   Last metabolic panel Lab Results  Component Value Date   GLUCOSE 673 (HH) 01/17/2022   NA 123 (L) 01/17/2022   K 4.0 01/17/2022   CL 89 (L) 01/17/2022   CO2 19 (L) 01/17/2022   BUN 17 01/17/2022   CREATININE 1.32 (H) 01/17/2022   GFRNONAA 57 (L) 01/17/2022   CALCIUM 9.0 01/17/2022   PROT 7.0 01/17/2022   ALBUMIN 3.0 (L) 01/17/2022   LABGLOB 2.6 10/20/2020   AGRATIO 1.8 10/20/2020   BILITOT 1.2 01/17/2022   ALKPHOS 82 01/17/2022   AST 16 01/17/2022   ALT 14 01/17/2022   ANIONGAP 15 01/17/2022   Last hemoglobin A1c Lab Results  Component Value Date   HGBA1C >14.0 01/17/2022      The ASCVD Risk score (Arnett DK, et al., 2019) failed to calculate for the following reasons:   The patient has a prior MI or stroke diagnosis    Assessment & Plan:   Problem List Items Addressed This Visit       Endocrine   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 Glucose >600 on POC. Reports he has been feeling tired with muscle cramps, diarrhea, and fevers at home. Daughter state he has not been taking any of his oral medications.  States he has skin breakdown of the head of his penis for the past month. Suspect this may be due to glucosuria and fungal infection.  He is unsure when he last took his insulin. States he takes it only when he remembers and having a lot of difficulty with memory at home. Denies chest pain or dysuria.   Suspect he his hyperglycemic due to not taking medications.  Concerned now in DKA and seems lethargic on exam. Possible infectious etiology given recent diarrhea x1 week and subjective fevers. No beds available bed for direct admission. Will sent him to ED for further workup and treatment of hyperglycemia   -Check stat CBC, CMP, BHB, UA  -Patient and family transported to ED -       Relevant Orders   Urinalysis, Reflex Microscopic   Beta-hydroxybutyric acid (Completed)   CMP w Anion Gap (STAT/Sunquest-performed on-site) (Completed)   CBC with Diff (Completed)   POC Hbg A1C (Completed)    No follow-ups on file.    Iona Beard, MD

## 2022-01-17 NOTE — ED Notes (Signed)
Patient continues to try and rip of monitoring equipment despite many efforts to educate the importance of doing such patient unable to comprehend or follow instructions

## 2022-01-17 NOTE — ED Provider Notes (Signed)
[name]   CSN: [id number] Arrival date & time: 01/17/22  1649    History  Chief Complaint  Patient presents with   Hyperglycemia    Mark Cabrera is a 71 y.o. male hx of IDDM, CHF, CAD, Afib here for evaluation of hyperglycemia. Seen by PCP earlier today for Diarrhea, fever, genital problem, recurrent falls at home. Found to be severly hyperglycemia >600. Patient noted to be non compliant with meds at home due to worsening memory. Concern for lethargy on exam.   Family here with multiple concerns.  Confused beyond his baseline dementia.  Not eating and drinking at home.  Has had 3 days of diarrhea.  2 days of black stool.unsure if taking Pepto at home.  He is currently anticoagulated.  Had fall PTA however this was witnessed, no head injury.  Not taking meds at home.  Has not showered in weeks. Pain to distal tip of penis with skin breakdown Has left hip pain, chronic. Ambulatory PTA. Some intermittent abd cramping with loose stool. No recent abx, travel. No dysuria, hematuria.  HPI     Home Medications Prior to Admission medications   Medication Sig Start Date End Date Taking? Authorizing Provider  acetaminophen (TYLENOL) 325 MG tablet Take 2 tablets (650 mg total) by mouth every 4 (four) hours as needed for headache or mild pain. 04/28/16  Yes Isaiah Serge, NP  Accu-Chek Softclix Lancets lancets Use as instructed 12/03/20   Maudie Mercury, MD  apixaban Arne Cleveland) 5 MG TABS tablet Take 1 tablet by mouth twice daily Patient not taking: Reported on 01/17/2022 12/08/20   Burnell Blanks, MD  escitalopram (LEXAPRO) 10 MG tablet Take 1 tablet (10 mg total) by mouth daily. Patient not taking: Reported on 01/17/2022 12/02/20   Mitzi Hansen, MD  glucose blood (ACCU-CHEK GUIDE) test strip Use as instructed 12/03/20   Maudie Mercury, MD  hydrOXYzine (ATARAX) 10 MG/5ML syrup Take 5 mLs (10 mg total) by mouth 3 (three) times  daily as needed for anxiety. Patient not taking: Reported on 01/17/2022 09/28/21   Romana Juniper, MD  insulin aspart (NOVOLOG FLEXPEN) 100 UNIT/ML FlexPen INJECT 18 UNITS UNDER THE SKIN THREE TIMES DAILY WITH MEALS Patient not taking: Reported on 01/17/2022 10/20/20   Riesa Pope, MD  Insulin Pen Needle 31G X 5 MM MISC Use one pen needle three times daily. Dx E11.49 09/14/21   [name], MD  lactulose (CHRONULAC) 10 GM/15ML solution Take 45 mLs (30 g total) by mouth 3 (three) times daily. Patient not taking: Reported on 01/17/2022 09/28/21   Romana Juniper, MD  metFORMIN (GLUCOPHAGE) 500 MG tablet Take 1 tablet (500 mg total) by mouth 2 (two) times daily with a meal. Patient not taking: Reported on 01/17/2022 12/30/20 01/17/22  Lacinda Axon, MD  polyethylene glycol (MIRALAX / GLYCOLAX) 17 g packet Take 17 g by mouth daily. Patient not taking: Reported on 01/17/2022 09/29/21   Romana Juniper, MD  REPATHA SURECLICK 409 MG/ML SOAJ INJECT 1 PEN INTO THE SKIN EVERY 14 (FOURTEEN) DAYS. Patient not taking: Reported on 01/17/2022 06/21/20   Burnell Blanks, MD  SOLIQUA 100-33 UNT-MCG/ML SOPN Inject 40 Units into the skin daily. Patient not taking: Reported on 01/17/2022 12/03/20   Maudie Mercury, MD  valACYclovir (VALTREX) 1000 MG tablet Take 1 tablet (1,000 mg total) by mouth 2 (two) times daily. Patient not taking: Reported on 09/21/2021 12/24/20   Orvis Brill, MD  vitamin B-12 (CYANOCOBALAMIN) 100 MCG  tablet Take 100 mcg by mouth daily. Patient not taking: Reported on 09/21/2021    [provider]      Allergies    Morpholine salicylate, Penicillins, Morphine and related, Sglt2 inhibitors, and Statins    Review of Systems   Review of Systems  Constitutional:  Positive for activity change, appetite change and fatigue.  HENT: Negative.    Respiratory: Negative.    Cardiovascular: Negative.   Gastrointestinal:  Positive for abdominal  pain, diarrhea and nausea. Negative for abdominal distention, anal bleeding, blood in stool, constipation, rectal pain and vomiting.  Endocrine: Positive for polyuria.  Genitourinary:  Positive for frequency and penile pain. Negative for decreased urine volume, difficulty urinating, dysuria, enuresis, flank pain, genital sores, hematuria, penile discharge, penile swelling, scrotal swelling, testicular pain and urgency.  Musculoskeletal: Negative.   Skin:  Positive for rash.  Neurological:  Positive for weakness (generalized). Negative for dizziness, tremors, seizures, syncope, speech difficulty, light-headedness, numbness and headaches.  All other systems reviewed and are negative.   Physical Exam Updated Vital Signs BP 128/68   Pulse 77   Temp 98 F (36.7 C) (Oral)   Resp 14   Ht '5\' 8"'$  (1.727 m)   Wt 77.6 kg   SpO2 100%   BMI 26.00 kg/m  Physical Exam Vitals and nursing note reviewed. Exam conducted with a chaperone present.  Constitutional:      General: He is not in acute distress.    Appearance: He is well-developed. He is not toxic-appearing or diaphoretic.     Comments: Disheveled  HENT:     Head: Normocephalic.     Comments: #3 punctate scalp to parietal scalp. No erythema, warmth, drainage    Nose: Nose normal.     Mouth/Throat:     Mouth: Mucous membranes are dry.  Eyes:     Pupils: Pupils are equal, round, and reactive to light.  Cardiovascular:     Rate and Rhythm: Normal rate and regular rhythm.     Pulses: Normal pulses.     Heart sounds: Normal heart sounds.  Pulmonary:     Effort: Pulmonary effort is normal. No respiratory distress.     Breath sounds: Normal breath sounds.  Abdominal:     General: Bowel sounds are normal. There is no distension.     Palpations: Abdomen is soft.     Tenderness: There is no abdominal tenderness. There is no right CVA tenderness, left CVA tenderness, guarding or rebound.  Genitourinary:    Penis: Erythema, tenderness and  lesions present. No phimosis, paraphimosis, discharge or swelling.      Testes: Normal. Cremasteric reflex is present.     Epididymis:     Right: Normal.     Left: Normal.     Comments: Tech present in room. Dark [place] stool in rectal vault.  No testicular erythema, warmth, fluctuance, induration  Distal penis with skin breakdown, yeast present Musculoskeletal:        General: Normal range of motion.     Cervical back: Normal range of motion and neck supple.     Comments: No midline C/T/L tenderness.  Full range of motion bilateral hips.  Skin:    General: Skin is warm and dry.  Neurological:     General: No focal deficit present.     Mental Status: He is alert and oriented to person, place, and time.     Cranial Nerves: Cranial nerves 2-12 are intact.     Sensory: Sensation is intact.  Motor: Motor function is intact.     Coordination: Coordination is intact.     Gait: Gait is intact.     Comments: Intermittently confused    ED Results / Procedures / Treatments   Labs (all labs ordered are listed, but only abnormal results are displayed) Labs Reviewed  CBC WITH DIFFERENTIAL/PLATELET - Abnormal; Notable for the following components:      Result Value   WBC 2.7 (*)    RBC 3.49 (*)    Hemoglobin 10.9 (*)    HCT 31.2 (*)    All other components within normal limits  COMPREHENSIVE METABOLIC PANEL - Abnormal; Notable for the following components:   Sodium 124 (*)    Chloride 91 (*)    CO2 19 (*)    Glucose, Bld 676 (*)    Creatinine, Ser 1.27 (*)    Calcium 8.8 (*)    Total Protein 6.4 (*)    Albumin 2.8 (*)    GFR, Estimated 60 (*)    All other components within normal limits  URINALYSIS, ROUTINE W REFLEX MICROSCOPIC - Abnormal; Notable for the following components:   Color, Urine STRAW (*)    Glucose, UA >=500 (*)    Ketones, ur 20 (*)    All other components within normal limits  BETA-HYDROXYBUTYRIC ACID - Abnormal; Notable for the following components:    Beta-Hydroxybutyric Acid 1.52 (*)    All other components within normal limits  CBG MONITORING, ED - Abnormal; Notable for the following components:   Glucose-Capillary >600 (*)    All other components within normal limits  CBG MONITORING, ED - Abnormal; Notable for the following components:   Glucose-Capillary 594 (*)    All other components within normal limits  I-STAT VENOUS BLOOD GAS, ED - Abnormal; Notable for the following components:   pH, Ven 7.436 (*)    pCO2, Ven 30.2 (*)    pO2, Ven 24 (*)    TCO2 21 (*)    Acid-base deficit 3.0 (*)    Sodium 124 (*)    Calcium, Ion 1.08 (*)    HCT 33.0 (*)    Hemoglobin 11.2 (*)    All other components within normal limits  I-STAT CHEM 8, ED - Abnormal; Notable for the following components:   Sodium 124 (*)    Chloride 92 (*)    Glucose, Bld 669 (*)    Calcium, Ion 1.06 (*)    TCO2 21 (*)    Hemoglobin 11.2 (*)    HCT 33.0 (*)    All other components within normal limits  CBG MONITORING, ED - Abnormal; Notable for the following components:   Glucose-Capillary 599 (*)    All other components within normal limits  SARS CORONAVIRUS 2 BY RT PCR  GASTROINTESTINAL PANEL BY PCR, STOOL (REPLACES STOOL CULTURE)  LIPASE, BLOOD  OSMOLALITY  BASIC METABOLIC PANEL  BASIC METABOLIC PANEL  BASIC METABOLIC PANEL  BASIC METABOLIC PANEL  POC OCCULT BLOOD, ED    EKG EKG Interpretation  Date/Time:  Tuesday January 17 2022 18:08:57 EST Ventricular Rate:  96 PR Interval:    QRS Duration: 124 QT Interval:  394 QTC Calculation: 497 R Axis:   -43 Text Interpretation: Atrial fibrillation Left axis deviation Non-specific ST-t changes Confirmed by Lajean Saver [phone]) on 01/17/2022 6:37:27 PM  Radiology DG Chest 2 View  Result Date: 01/17/2022 CLINICAL DATA:  Altered mental status EXAM: CHEST - 2 VIEW COMPARISON:  09/24/2021 FINDINGS: Valve prosthesis and atrial appendage clip. Borderline to  mild cardiomegaly. No acute airspace disease,  pleural effusion, or pneumothorax. IMPRESSION: No active cardiopulmonary disease. Borderline to mild cardiomegaly. Electronically Signed   By: Donavan Foil M.D.   On: 01/17/2022 20:48   CT ABDOMEN PELVIS W CONTRAST  Result Date: 01/17/2022 CLINICAL DATA:  Fever and diarrhea. EXAM: CT ABDOMEN AND PELVIS WITH CONTRAST TECHNIQUE: Multidetector CT imaging of the abdomen and pelvis was performed using the standard protocol following bolus administration of intravenous contrast. RADIATION DOSE REDUCTION: This exam was performed according to the departmental dose-optimization program which includes automated exposure control, adjustment of the mA and/or kV according to patient size and/or use of iterative reconstruction technique. CONTRAST:  44m OMNIPAQUE IOHEXOL 350 MG/ML SOLN COMPARISON:  September 24, 2021 FINDINGS: Lower chest: Mild atelectasis is seen within the bilateral lung bases. Hepatobiliary: The liver is cirrhotic in appearance. Diffuse fatty infiltration of the liver parenchyma is noted. No focal liver abnormality is seen. No gallstones, gallbladder wall thickening, or biliary dilatation. Pancreas: Unremarkable. No pancreatic ductal dilatation or surrounding inflammatory changes. Spleen: The spleen is mildly enlarged. Adrenals/Urinary Tract: Adrenal glands are unremarkable. Kidneys are normal, without renal calculi, focal lesion, or hydronephrosis. Mild, stable diffuse urinary bladder wall thickening is noted. Stomach/Bowel: Stomach is within normal limits. Appendix appears normal. No evidence of bowel wall thickening, distention, or inflammatory changes. Vascular/Lymphatic: Aortic atherosclerosis. No enlarged abdominal or pelvic lymph nodes. Reproductive: Prostate gland is mildly enlarged. Other: A 2.6 cm x 1.6 cm fat containing left inguinal hernia is seen. No abdominopelvic ascites. Musculoskeletal: No acute or significant osseous findings. IMPRESSION: 1. Hepatic steatosis with cirrhosis. 2. Mild  splenomegaly. 3. Small fat containing left inguinal hernia. 4. Aortic atherosclerosis. Aortic Atherosclerosis (ICD10-I70.0). Electronically Signed   By: TVirgina NorfolkM.D.   On: 01/17/2022 20:42   CT HEAD WO CONTRAST (5MM)  Result Date: 01/17/2022 CLINICAL DATA:  Diarrhea and fatigue. EXAM: CT HEAD WITHOUT CONTRAST TECHNIQUE: Contiguous axial images were obtained from the base of the skull through the vertex without intravenous contrast. RADIATION DOSE REDUCTION: This exam was performed according to the departmental dose-optimization program which includes automated exposure control, adjustment of the mA and/or kV according to patient size and/or use of iterative reconstruction technique. COMPARISON:  November 08, 2020 FINDINGS: Brain: There is mild cerebral atrophy with widening of the extra-axial spaces and ventricular dilatation. There are areas of decreased attenuation within the white matter tracts of the supratentorial brain, consistent with microvascular disease changes. A small chronic left occipital lobe infarct is seen. Vascular: There is moderate to marked severity calcification of the bilateral cavernous carotid arteries. Skull: Normal. Negative for fracture or focal lesion. Sinuses/Orbits: Postsurgical changes are again seen within the left globe. Other: None. IMPRESSION: 1. No acute intracranial abnormality. 2. Small chronic left occipital lobe infarct. 3. Generalized cerebral atrophy with widening of the extra-axial spaces and ventricular dilatation. Electronically Signed   By: TVirgina NorfolkM.D.   On: 01/17/2022 20:36    Procedures .Critical Care  Performed by: HNettie Elm PA-C Authorized by: HNettie Elm PA-C   Critical care provider statement:    Critical care time (minutes):  35   Critical care was necessary to treat or prevent imminent or life-threatening deterioration of the following conditions:  Dehydration, endocrine crisis, CNS failure or compromise and  metabolic crisis   Critical care was time spent personally by me on the following activities:  Development of treatment plan with patient or surrogate, discussions with consultants, evaluation of patient's response to treatment, examination  of patient, ordering and review of laboratory studies, ordering and review of radiographic studies, ordering and performing treatments and interventions, pulse oximetry, re-evaluation of patient's condition and review of old charts     Medications Ordered in ED Medications  insulin regular, human (MYXREDLIN) 100 units/ 100 mL infusion (13 Units/hr Intravenous New Bag/Given 01/17/22 2119)  lactated ringers infusion ( Intravenous New Bag/Given 01/17/22 2123)  dextrose 5 % in lactated ringers infusion (0 mLs Intravenous Hold 01/17/22 2106)  dextrose 50 % solution 0-50 mL (has no administration in time range)  potassium chloride 10 mEq in 100 mL IVPB (10 mEq Intravenous New Bag/Given 01/17/22 2131)  nystatin (MYCOSTATIN/NYSTOP) topical powder (has no administration in time range)  sodium chloride 0.9 % bolus 500 mL (0 mLs Intravenous Stopped 01/17/22 2031)  insulin aspart (novoLOG) injection 8 Units (8 Units Subcutaneous Given 01/17/22 1952)  iohexol (OMNIPAQUE) 350 MG/ML injection 70 mL (70 mLs Intravenous Contrast Given 01/17/22 2029)  lactated ringers bolus 1,000 mL (1,000 mLs Intravenous New Bag/Given 01/17/22 2123)   ED Course/ Medical Decision Making/ A&P    71 year old history of diabetes, heart failure, hypertension, A-fib on chronic anticoagulation, cirrhosis, prior CVA here for evaluation multiple complaints.  Was seen at PCP office today for increased worsening confusion, hyperglycemia, diarrhea, black stools, subjective fevers as well as failure to thrive.  Not taking medications at home.  Daughter recently became medical POA.  Confused at baseline.  Has not showered in weeks per family.  Not eating and drinking.  Recurrent falls. Slow to answer question  however alert. Non focal Neuro exam without deficits. Witnessed fall PTA, no head injury per family. Ambulatory PTA.   Labs and imaging personally viewed and interpreted:  CBC leukopenia 2.7, hemoglobin 10.9> at baseline Metabolic panel sodium 638, suspect pseudohyponatremia in setting of hyperglycemia at 676, creatinine 1.27 Beta hydroxy 1.5 Lipase 32 Occult Negative VBG pH 7.43, bicarb 20 COVID neg Chest xray without cardiomegaly, pulm edema, pneumothorax CT abdomen pelvis without significant abnormality CT head without CVA, bleed EKG rate controlled A-fib  Reassessed.  Sleeping in room.  Arousable to voice.  Pending remaining labs, CT imaging.  Family agreeable for likely admission.  Repeat CBG after subcu insulin IV fluids still significantly elevated at 599.  Will start insulin drip, HHS orders placed.  Will admit for further work-up and management.  Discussed plan with patient, daughter who is medical POA in room.  Agreeable for further work-up.  CONSULT with IM teaching who is agreeable to evaluate patient for admission.  The patient appears reasonably stabilized for admission considering the current resources, flow, and capabilities available in the ED at this time, and I doubt any other [facility] requiring further screening and/or treatment in the ED prior to admission.                            Medical Decision Making Amount and/or Complexity of Data Reviewed Independent Historian:     Details: Family in room External Data Reviewed: labs, radiology, ECG and notes. Labs: ordered. Decision-making details documented in ED Course. Radiology: ordered and independent interpretation performed. Decision-making details documented in ED Course. ECG/medicine tests: ordered and independent interpretation performed. Decision-making details documented in ED Course.  Risk OTC drugs. Prescription drug management. Parenteral controlled substances. Decision regarding  hospitalization. Diagnosis or treatment significantly limited by social determinants of health.          Final Clinical Impression(s) / ED Diagnoses Final diagnoses:  Hyperglycemia  AKI (acute kidney injury) ([place])  Diarrhea, unspecified type  Confusion  Failure to thrive (child)  Hyperosmolar hyperglycemic state (HHS) ([place])  Skin yeast infection  Pseudohyponatremia    Rx / DC Orders ED Discharge Orders     None         Ayodeji Keimig A, PA-C 01/17/22 2148    Lajean Saver, MD 01/17/22 2202

## 2022-01-17 NOTE — ED Notes (Signed)
Transported to XR at this time

## 2022-01-17 NOTE — Assessment & Plan Note (Addendum)
Continues to have significant memory loss now with mood changes and getting more irritable and agitated. Daughter is now legal guardian. Family is tring to find memory care unit due to worsening functional functional status. He is unable to bath himself, having falls, cannot remember to take medications. He is currently living with an elderly roommate and family do not think they can mange him at home. FL2 completed in October but recently expired. Sent to ED today for concern for DKA, if admitted likely will need SNF at discharge.

## 2022-01-17 NOTE — ED Notes (Signed)
Falls risk band, yellow socks, and fall precautions in place. Patient moved to a hospital bed with bed alarm on. Call bell is in reach

## 2022-01-17 NOTE — ED Notes (Signed)
Provider Henderly PA notified of the patients families concern for the patient and their need to speak with social work.

## 2022-01-17 NOTE — ED Notes (Signed)
Critical glucose of 676 reported to [employer]

## 2022-01-17 NOTE — H&P (Incomplete)
Date: 01/18/2022               Patient Name:  Mark Cabrera MRN: [id number]  DOB: [birth year] Age / Sex: 71 y.o., male   PCP: [name], MD         Medical Service: Internal Medicine Teaching Service         Attending Physician: Dr. Charise Killian, MD      First Contact: Dr. Linward Natal, MD Pager [phone]    Second Contact: Dr. Farrel Gordon, DO Pager [phone]         After Hours (After 5p/  First Contact Pager: [phone]  weekends / holidays): Second Contact Pager: [phone]   SUBJECTIVE   Chief Complaint: Hyperglycemia  History of Present Illness:   [name] is a 71 year old male with a past medical history of diabetes mellitus, HLD, HTN, Chronic Diastolic HF, CAD s/p CABG, A-Fib, sleep apnea, cirrhosis, and multiple CVAs who presents from the internal medicine clinic due to a glucose value of over 600 on POC. He states that he does not know why he is in the hospital. He states that he has had multiple days of dark diarrhea, but denies any abdominal pain. He takes his insulin by himself, and does not have anyone monitoring him when he does take his medication. He correctly states that he should be using his novolog three times a day, and also his long acting once a day, however was not able to recall the last time he was used his insulin. He denies any recent fevers, chills, coughs.    For the six months he has also had this peeling of the head of his penis. It is uncomfortable, but not painful. He denies any dysuria. He carries a diagnosis of herpes genitalia, and has been previously treated with Valtrex. In last admission Valtrex was started, however white plaques started to develop and was then treated with diflucan and topical lotrimin with resolution.   Pt is overall poor historian, and attempted to call daughter (legal guardian) twice but she did not pick up. There have been many questions recently per chart review about patient's cognition. Will need to discuss tomorrow  with daughter to get a more detailed history involving diarrhea and decreased function.   ED Course: Pt was started on endotool, and IMTS was consulted for admission   Meds:   From Last Discharge in July, 2023  Acetaminophen '325mg'$  Eliquis '5mg'$   Escitalopram '10mg'$   Folic Acid '1mg'$   Hydroxyzine '10mg'$  Lactulose  Metformin '500mg'$  BID Novolog 18U with mealtime  Soliqua 40U bedtime Miralax  Senna  Thiamine '100mg'$   Vitamin B12 150mg    Past Medical History  Diabetes Mellitus HLD HTN Chronic Diastolic HF CAD s/p CABG A-Fib Sleep apnea Compensated Hepatic Cirrhosis Multiple CVAs  Past Surgical History:  Procedure Laterality Date   AORTIC VALVE REPLACEMENT N/A 03/30/2016   Procedure: AORTIC VALVE REPLACEMENT (AVR);  Surgeon: SMelrose Nakayama MD;  [facility]  Service: Open Heart Surgery;  Laterality: N/A;   CARDIAC CATHETERIZATION N/A 03/15/2016   Procedure: Right/Left Heart Cath and Coronary Angiography;  Surgeon: CBurnell Blanks MD;  [facility];  Service: Cardiovascular;  Laterality: N/A;   CLIPPING OF ATRIAL APPENDAGE N/A 03/30/2016   Procedure: CLIPPING OF LEFT ATRIAL APPENDAGE;  Surgeon: SMelrose Nakayama MD;  [facility] [facility]  Service: Open Heart Surgery;  Laterality: N/A;   CORONARY ARTERY BYPASS GRAFT N/A 03/30/2016   Procedure: CORONARY ARTERY BYPASS GRAFTING (  CABG) Times Two;  Surgeon: [name], MD;  Location: [place];  Service: Open Heart Surgery;  Laterality: N/A;   KNEE ARTHROSCOPY W/ PARTIAL MEDIAL MENISCECTOMY  05/12/2005   right, performed by Dr. French Ana for torn medial meniscus.   ROTATOR CUFF REPAIR Right 2016   STERNAL WIRES REMOVAL N/A 08/13/2017   Procedure: STERNAL WIRES REMOVAL;  Surgeon: [name], MD;  Location: [place];  Service: Thoracic;  Laterality: N/A;   SUBXYPHOID PERICARDIAL WINDOW N/A 04/21/2016   Procedure: SUBXYPHOID PERICARDIAL WINDOW;  Surgeon: [name], MD;  Location: [place];   Service: Thoracic;  Laterality: N/A;   TEE WITHOUT CARDIOVERSION N/A 03/30/2016   Procedure: TRANSESOPHAGEAL ECHOCARDIOGRAM (TEE);  Surgeon: [name], MD;  Location: [place];  Service: Open Heart Surgery;  Laterality: N/A;   TEE WITHOUT CARDIOVERSION N/A 04/21/2016   Procedure: TRANSESOPHAGEAL ECHOCARDIOGRAM (TEE);  Surgeon: [name], MD;  Location: [facility];  Service: Thoracic;  Laterality: N/A;    Social:  Lives With: By himself  Occupation: Used to Publishing copy  Support: Daughter in the area Level of Function: When not acutely ill can perform all ADLs/IADLs by himself  PCP: Dr. [name], MD Substances: Denies cigarette, alcohol, marijuana, cocaine, and heroin use.   Family History:   No relevant family history  Allergies: Allergies as of 01/17/2022 - Review Complete 01/17/2022  Allergen Reaction Noted   Morpholine salicylate Other (See Comments) 10/16/2019   Penicillins Other (See Comments) 10/16/2019   Morphine and related Other (See Comments) 04/18/2016   Sglt2 inhibitors Rash and Other (See Comments) 04/12/2018   Statins Rash and Other (See Comments) 04/18/2016    Review of Systems: A complete ROS was negative except as per HPI.   OBJECTIVE:   Physical Exam: Blood pressure 123/69, pulse 76, temperature 98 F (36.7 C), temperature source Oral, resp. rate 17, height '5\' 8"'$  (1.727 m), weight 77.6 kg, SpO2 100 %.  Constitutional: Tired appearing, elderly gentlemen in no acute distress HENT: normocephalic atraumatic, mucous membranes moist Eyes: conjunctiva non-erythematous Neck: supple Cardiovascular: regular rate and rhythm, 3/6 systolic murmur on LUS border Pulmonary/Chest: normal work of breathing on room air, lungs clear to auscultation bilaterally Abdominal: soft, non-tender, non-distended MSK: normal bulk and tone Genitourinary: Scaly, red plaques located on foreskin and penile glands Neurological: alert & oriented x 3 Skin: warm and  dry  *Dr. Johnney Ou present during GU exam  Labs: CBC    Component Value Date/Time   WBC 2.7 (L) 01/17/2022 1949   RBC 3.49 (L) 01/17/2022 1949   HGB 11.2 (L) 01/17/2022 1955   HGB 11.2 (L) 01/17/2022 1955   HGB 13.3 10/20/2020 1142   HCT 33.0 (L) 01/17/2022 1955   HCT 33.0 (L) 01/17/2022 1955   HCT 39.8 10/20/2020 1142   PLT 189 01/17/2022 1949   PLT 203 10/20/2020 1142   MCV 89.4 01/17/2022 1949   MCV 90 10/20/2020 1142   MCH 31.2 01/17/2022 1949   MCHC 34.9 01/17/2022 1949   RDW 14.1 01/17/2022 1949   RDW 13.4 10/20/2020 1142   LYMPHSABS 0.8 01/17/2022 1949   MONOABS 0.3 01/17/2022 1949   EOSABS 0.0 01/17/2022 1949   BASOSABS 0.0 01/17/2022 1949     CMP     Component Value Date/Time   NA 129 (L) 01/18/2022 0100   NA 140 10/20/2020 1142   K 2.8 (L) 01/18/2022 0100   CL 99 01/18/2022 0100   CO2 20 (L) 01/18/2022 0100   GLUCOSE 208 (H)  01/18/2022 0100   BUN 13 01/18/2022 0100   BUN 15 10/20/2020 1142   CREATININE 0.75 01/18/2022 0100   CREATININE 0.89 03/10/2016 1619   CALCIUM 8.0 (L) 01/18/2022 0100   PROT 6.4 (L) 01/17/2022 1949   PROT 7.3 10/20/2020 1142   ALBUMIN 2.8 (L) 01/17/2022 1949   ALBUMIN 4.7 10/20/2020 1142   AST 17 01/17/2022 1949   ALT 15 01/17/2022 1949   ALKPHOS 74 01/17/2022 1949   BILITOT 0.7 01/17/2022 1949   BILITOT 0.8 10/20/2020 1142   GFRNONAA >60 01/18/2022 0100   GFRNONAA >89 04/25/2013 1535   GFRAA 104 04/09/2020 0814   GFRAA >89 04/25/2013 1535    Imaging:  CT A/P: 1. Hepatic steatosis with cirrhosis. 2. Mild splenomegaly. 3. Small fat containing left inguinal hernia. 4. Aortic atherosclerosis.   Chest X-Ray  No active cardiopulmonary disease. Borderline to mild cardiomegaly.    EKG: Wandering baseline, signs of atrial flutter, difficult to interpret, repeat ordered  ASSESSMENT & PLAN:   Assessment & Plan by Problem: Principal Problem:   Hyperglycemia   BELL CAI is a 71 y.o. person living with a  history of  DM, HLD, heart failure, CAD status post CABG, AFib, sleep apnea, compensated cirrhosis, and multiple CVAs  who presented with hyperglycemia and admitted for diabetic ketoacidosis on hospital day 1   #Severe Hyperglycemia Pt presented with a glucose level over 600, and A1c is greater than 14. Per review of clinic note earlier today, it seems pt has been getting progressive more irritable and agitated, overall decline in functional status. Daughter stated at time of clinic visit that it is very difficult for her father to remember to take his medications because of his dementia. On exam, patient is pleasant and awake, able to respond to commands and answer questions well. UA is positive for glucosuria and ketonuria, beta-hydroxybutyric acid is elevated at 1.52. Most recent CBG check was 356, down from initial over >600. Anion gap initially elevated at 15, then 14, and is now within normal limits, and bicarb is low at 19, From chart review of clinic encounter, and not being able to contact daughter seems he has not had any vomiting episodes. Suspect this is early signs of DKA.  Potassium came back at 2.8, had to stop insulin and replete with Potassium.   Plan:  - Continue endotool once potassium is above 3.3.  - Q1hr glucose Checks  - Consider transition off when pt AG <12 consecutively  - BMP Q4 Hours  #Diarrhea  Pt also has had multiple day history of loose stools, so potential GI infection. From chart review pt was given lactulose on last discharge, but unsure if pt is still taking. Would recommend contacting daughter for further questioning and clarification of medication.   Plan:  - Follow up GI Panel ordered by ED - Possible infectious etiology vs medication  #Hyponatremia 2/2 Hyperglycemia  Corrected sodium level was 132. Pt is currently receiving LR, will continue to monitor with each BMP.   Plan: - Pending osmolality  - Expect hyponatremia to improve with correction of  glucose.  #Penile Lesions Pt reporting these lesions have been present for more than six months. 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 He was treated with Valtrex, however diagnosis was then shifted to blanitis and he was treated with diflucan and topical lotrimin with resolution of symptoms. Foreskin was retractable, so no concern for phimosis or urgent urology consult.  Plan:  - Start diflucan and topical lotrimin  - If does not improve consider psoriasis  #HFrEF EF 50-55% NYHA I  Pt follows with [facility], and last cardiology appointment was 04/29/20. Echo in 04/11/19 showed a EF of 50-55%, LV had low normal function, and left ventricular internal cavity size. Unsure as to why pt is not on GDMT management  #Hepatic Cirrhosis  #Chronic Leukopenia Was initially noted in 2016, likely in the setting of hepatic cirrhosis. WBC count is currently 2.7. Pt has not been febrile since emergency department. Blood smear on last admission showed pancytopenia without blasts, and occasional reactive plasmacytoid lymphs but no hairy cells. Hepatitis serologies were checked as well which were non-reactive. Unclear etiology of cirrhosis, last ultrasound was done in 2021 with findings consisted with compensated cirrhosis. AST/ALT within normal limits currently. Per chart review, was reportedly following with [facility] hepatology, however most appointments have been missed and pt declined to reschedule.   #Paroxysmal Atrial Fibrillation  Pt has history of A-Fib, and is supposed to be on eliquis. Will continue Eliquis. CHADVASC score is 7, correlating with a 15.7% risk of stroke in a year.   Plan:  - F/U EKG  - Continue cardiac monitoring - Eliquis '5mg'$   #Normocytic Anemia Pt has Hb of 11.2, likely in the setting of chronic conditions. Continue to monitor.   Plan:  - Daily CBC  #Code Status  Pt requested to be DNR, and stated he had signed papers  previously documetning his wishes. No documents seen in the chart. Daughter is POA, and unable to contact her at night. Would recommend contacting patient   Diet: NPO VTE: DOAC IVF: LR,125cc/hr Code: DNR  Prior to Admission Living Arrangement: Home, living   Anticipated Discharge Location: SNF Barriers to Discharge: Medical Management  Dispo: Admit patient to Inpatient with expected length of stay greater than 2 midnights.  Signed: Drucie Opitz, MD Internal Medicine Resident PGY-1  01/18/2022, 4:11 AM   Dr. Drucie Opitz, MD Pager [phone]

## 2022-01-17 NOTE — ED Notes (Signed)
Mark Cabrera reports he fell right prior to coming here.  Complains of mild left hip pain.  Did not hit head. Patient has been ambulatory since,.

## 2022-01-17 NOTE — ED Triage Notes (Signed)
Complains of fatigue diarrhea and fever x x1 week with breakdown on head of penis. Patient has not been taking his medications or insulin.  Patient unsure when he took his medications last.

## 2022-01-17 NOTE — ED Provider Triage Note (Signed)
Emergency Medicine Provider Triage Evaluation Note  Mark Cabrera , a 71 y.o. male  was evaluated in triage.  Pt complains of nausea and vomiting, generalized weakness.  Patient was seen by his primary care doctor, found to have a sodium of 122, hyperglycemic in the 600s.  Sent to ED for additional evaluation.    Review of Systems  Per hPI  Physical Exam  BP (!) 141/71 (BP Location: Right Arm)   Pulse 74   Temp (!) 97.5 F (36.4 C) (Oral)   Resp 16   Ht '5\' 8"'$  (1.727 m)   Wt 77.6 kg   SpO2 98%   BMI 26.00 kg/m  Gen:   Awake, no distress   Resp:  Normal effort  MSK:   Moves extremities without difficulty  Other:    Medical Decision Making  Medically screening exam initiated at 5:39 PM.  Appropriate orders placed.  [name] was informed that the remainder of the evaluation will be completed by another provider, this initial triage assessment does not replace that evaluation, and the importance of remaining in the ED until their evaluation is complete.  Labs obtained this AM in chart. I was going to order repeat but I is not indicated given labs from earlier this morning are in the chart.Sherrill Raring, PA-C 01/17/22 1740

## 2022-01-17 NOTE — ED Notes (Signed)
Patient transported to CT 

## 2022-01-17 NOTE — ED Notes (Signed)
Critical glucose reported to PA at this time

## 2022-01-17 NOTE — ED Notes (Signed)
Lost IV access at this time patient found to have pulled out Ivs at this time in attempt to get out of bed. Patient provided with full linen change. Patient refused to get dressed however brief was placed on the patient. Patients call bell is at bedside patient cannot comprehend instructions on how to use call bell

## 2022-01-17 NOTE — Assessment & Plan Note (Addendum)
Patient present here for follow up. Glucose >600 on POC. Reports he has been feeling tired with muscle cramps, diarrhea, and fevers at home. Daughter state he has not been taking any of his oral medications.  States he has skin breakdown of the head of his penis for the past month. Suspect this may be due to glucosuria and fungal infection.  He is unsure when he last took his insulin. States he takes it only when he remembers and having a lot of difficulty with memory at home. Denies chest pain or dysuria.   Suspect he his hyperglycemic due to not taking medications. Concerned now in DKA and seems lethargic on exam. Possible infectious etiology given recent diarrhea x1 week and subjective fevers. No beds available bed for direct admission. Will sent him to ED for further workup and treatment of hyperglycemia   -Check stat CBC, CMP, BHB, UA  -Patient and family transported to ED -

## 2022-01-17 NOTE — ED Notes (Signed)
Hospitalist at bedside at this time 

## 2022-01-18 ENCOUNTER — Ambulatory Visit (INDEPENDENT_AMBULATORY_CARE_PROVIDER_SITE_OTHER): Payer: Medicare Other

## 2022-01-18 VITALS — BP 119/97 | HR 61 | Temp 98.6°F | Ht 68.0 in | Wt 171.7 lb

## 2022-01-18 DIAGNOSIS — Z0000 Encounter for general adult medical examination without abnormal findings: Secondary | ICD-10-CM

## 2022-01-18 DIAGNOSIS — R739 Hyperglycemia, unspecified: Secondary | ICD-10-CM

## 2022-01-18 LAB — BASIC METABOLIC PANEL
Anion gap: 10 (ref 5–15)
Anion gap: 10 (ref 5–15)
Anion gap: 10 (ref 5–15)
BUN: 13 mg/dL (ref 8–23)
BUN: 13 mg/dL (ref 8–23)
BUN: 12 mg/dL (ref 8–23)
CO2: 20 mmol/L — ABNORMAL LOW (ref 22–32)
CO2: 22 mmol/L (ref 22–32)
CO2: 22 mmol/L (ref 22–32)
Calcium: 8 mg/dL — ABNORMAL LOW (ref 8.9–10.3)
Calcium: 8.8 mg/dL — ABNORMAL LOW (ref 8.9–10.3)
Calcium: 8.6 mg/dL — ABNORMAL LOW (ref 8.9–10.3)
Chloride: 99 mmol/L (ref 98–111)
Chloride: 102 mmol/L (ref 98–111)
Chloride: 100 mmol/L (ref 98–111)
Creatinine, Ser: 0.75 mg/dL (ref 0.61–1.24)
Creatinine, Ser: 0.84 mg/dL (ref 0.61–1.24)
Creatinine, Ser: 0.89 mg/dL (ref 0.61–1.24)
GFR, Estimated: >60 mL/min (ref >60)
GFR, Estimated: >60 mL/min (ref >60)
GFR, Estimated: >60 mL/min (ref >60)
Glucose, Bld: 208 mg/dL — ABNORMAL HIGH (ref 70–99)
Glucose, Bld: 256 mg/dL — ABNORMAL HIGH (ref 70–99)
Glucose, Bld: 257 mg/dL — ABNORMAL HIGH (ref 70–99)
Potassium: 2.8 mmol/L — ABNORMAL LOW (ref 3.5–5.1)
Potassium: 3.5 mmol/L (ref 3.5–5.1)
Potassium: 3.4 mmol/L — ABNORMAL LOW (ref 3.5–5.1)
Sodium: 129 mmol/L — ABNORMAL LOW (ref 135–145)
Sodium: 134 mmol/L — ABNORMAL LOW (ref 135–145)
Sodium: 132 mmol/L — ABNORMAL LOW (ref 135–145)

## 2022-01-18 LAB — CBG MONITORING, ED
Glucose-Capillary: 201 mg/dL — ABNORMAL HIGH (ref 70–99)
Glucose-Capillary: 227 mg/dL — ABNORMAL HIGH (ref 70–99)
Glucose-Capillary: 228 mg/dL — ABNORMAL HIGH (ref 70–99)
Glucose-Capillary: 285 mg/dL — ABNORMAL HIGH (ref 70–99)
Glucose-Capillary: 318 mg/dL — ABNORMAL HIGH (ref 70–99)

## 2022-01-18 LAB — PHOSPHORUS: Phosphorus: 2.5 mg/dL (ref 2.5–4.6)

## 2022-01-18 LAB — GLUCOSE, CAPILLARY
Glucose-Capillary: 325 mg/dL — ABNORMAL HIGH (ref 70–99)
Glucose-Capillary: 312 mg/dL — ABNORMAL HIGH (ref 70–99)
Glucose-Capillary: 274 mg/dL — ABNORMAL HIGH (ref 70–99)

## 2022-01-18 LAB — OSMOLALITY: Osmolality: 305 mOsm/kg — ABNORMAL HIGH (ref 275–295)

## 2022-01-18 LAB — MAGNESIUM: Magnesium: 1.6 mg/dL — ABNORMAL LOW (ref 1.7–2.4)

## 2022-01-18 MED ORDER — METOPROLOL TARTRATE 5 MG/5ML IV SOLN: 5.0000 mg | Freq: Once | INTRAVENOUS | Status: DC

## 2022-01-18 MED ORDER — POTASSIUM CHLORIDE 10 MEQ/100ML IV SOLN
10.0000 meq | INTRAVENOUS | Status: AC
  Administered 2022-01-18 (×4): 10 meq via INTRAVENOUS
  Filled 2022-01-18 (×4): qty 100

## 2022-01-18 MED ORDER — CLOTRIMAZOLE 1 % EX CREA
TOPICAL_CREAM | Freq: Two times a day (BID) | CUTANEOUS | Status: DC
  Administered 2022-01-22: 1 via TOPICAL
  Filled 2022-01-18 (×2): qty 15

## 2022-01-18 MED ORDER — METOPROLOL SUCCINATE ER 25 MG PO TB24: 25.0000 mg | ORAL_TABLET | Freq: Every day | ORAL | Status: DC

## 2022-01-18 MED ORDER — FLUCONAZOLE 150 MG PO TABS: 150.0000 mg | ORAL_TABLET | Freq: Every day | ORAL | Status: DC

## 2022-01-18 MED ORDER — INSULIN GLARGINE-YFGN 100 UNIT/ML ~~LOC~~ SOLN
30.0000 [IU] | Freq: Every day | SUBCUTANEOUS | Status: DC
  Administered 2022-01-18: 30 [IU] via SUBCUTANEOUS
  Filled 2022-01-18 (×2): qty 0.3

## 2022-01-18 MED ORDER — MAGNESIUM SULFATE 2 GM/50ML IV SOLN
2.0000 g | Freq: Once | INTRAVENOUS | Status: AC
  Administered 2022-01-18: 2 g via INTRAVENOUS
  Filled 2022-01-18: qty 50

## 2022-01-18 MED ORDER — METOPROLOL TARTRATE 5 MG/5ML IV SOLN
INTRAVENOUS | Status: AC
  Filled 2022-01-18: qty 5

## 2022-01-18 MED ORDER — INSULIN ASPART 100 UNIT/ML IJ SOLN
10.0000 [IU] | Freq: Three times a day (TID) | INTRAMUSCULAR | Status: DC
  Administered 2022-01-18: 10 [IU] via SUBCUTANEOUS

## 2022-01-18 MED ORDER — FLUCONAZOLE 150 MG PO TABS: 150.0000 mg | ORAL_TABLET | Freq: Once | ORAL | Status: DC

## 2022-01-18 MED ORDER — APIXABAN 5 MG PO TABS
5.0000 mg | ORAL_TABLET | Freq: Two times a day (BID) | ORAL | Status: DC
  Administered 2022-01-18 – 2022-01-25 (×14): 5 mg via ORAL
  Filled 2022-01-18 (×14): qty 1

## 2022-01-18 MED ORDER — ACETAMINOPHEN 325 MG PO TABS
650.0000 mg | ORAL_TABLET | ORAL | Status: DC | PRN
  Administered 2022-01-19 – 2022-01-25 (×18): 650 mg via ORAL
  Filled 2022-01-18 (×18): qty 2

## 2022-01-18 MED ORDER — INSULIN ASPART 100 UNIT/ML IJ SOLN
0.0000 [IU] | Freq: Three times a day (TID) | INTRAMUSCULAR | Status: DC
  Administered 2022-01-18 (×2): 4 [IU] via SUBCUTANEOUS
  Administered 2022-01-19: 5 [IU] via SUBCUTANEOUS
  Administered 2022-01-19: 2 [IU] via SUBCUTANEOUS
  Administered 2022-01-19: 3 [IU] via SUBCUTANEOUS
  Administered 2022-01-20: 1 [IU] via SUBCUTANEOUS
  Administered 2022-01-20: 4 [IU] via SUBCUTANEOUS
  Administered 2022-01-20: 3 [IU] via SUBCUTANEOUS
  Administered 2022-01-21: 4 [IU] via SUBCUTANEOUS
  Administered 2022-01-21: 3 [IU] via SUBCUTANEOUS
  Administered 2022-01-22 – 2022-01-23 (×2): 1 [IU] via SUBCUTANEOUS
  Administered 2022-01-24: 0 [IU] via SUBCUTANEOUS
  Administered 2022-01-24: 3 [IU] via SUBCUTANEOUS

## 2022-01-18 MED ORDER — INSULIN ASPART 100 UNIT/ML IJ SOLN: 0.0000 [IU] | Freq: Three times a day (TID) | INTRAMUSCULAR | Status: DC

## 2022-01-18 MED ORDER — METOPROLOL SUCCINATE ER 25 MG PO TB24
25.0000 mg | ORAL_TABLET | Freq: Every day | ORAL | Status: DC
  Administered 2022-01-19 – 2022-01-23 (×5): 25 mg via ORAL
  Filled 2022-01-18 (×5): qty 1

## 2022-01-18 MED ORDER — ESCITALOPRAM OXALATE 10 MG PO TABS
10.0000 mg | ORAL_TABLET | Freq: Every day | ORAL | Status: DC
  Administered 2022-01-19 – 2022-01-25 (×7): 10 mg via ORAL
  Filled 2022-01-18 (×7): qty 1

## 2022-01-18 NOTE — ED Notes (Signed)
Doctor is in the room with multiple other people. Doctor states they will leave in just a few minutes to allow me to administer the medication.

## 2022-01-18 NOTE — ED Notes (Signed)
Pt reports not receiving breakfast. Called nutrition for pt lunch tray at this time.

## 2022-01-18 NOTE — Patient Outreach (Signed)
  Care Coordination   Patient Care Consultation  Visit Note   01/18/2022 Name: Mark Cabrera MRN: [id number] DOB: 06-27-1950  Mark Cabrera is a 71 y.o. year old male who sees Mark Dame, MD for primary care. I  discussed patient need for placement with Dr. Lisabeth Cabrera, however, the patient was in poor shape here at the office that he was sent to the ED to be admitted.  What matters to the patients health and wellness today?  Patient discharge plans    Goals Addressed   None     SDOH assessments and interventions completed:  Yes  SDOH Interventions Today    Flowsheet Row Most Recent Value  SDOH Interventions   Housing Interventions Intervention Not Indicated  Transportation Interventions Intervention Not Indicated        Care Coordination Interventions Activated:  Yes  Care Coordination Interventions:  Yes, provided   Follow up plan:  Review case for discharge from acute    Encounter Outcome:  Pt. Visit Completed

## 2022-01-18 NOTE — Progress Notes (Signed)
Subjective:   Mark Cabrera is a 71 y.o. male who presents for an Initial Medicare Annual Wellness Visit. I connected with  Mark Cabrera on 01/18/22 by a  Face-To-Face  enabled telemedicine application and verified that I am speaking with the correct person using two identifiers.  Patient Location: Other:  Office/Clinic  Provider Location: Office/Clinic  I discussed the limitations of evaluation and management by telemedicine. The patient expressed understanding and agreed to proceed.  Review of Systems    Defer to PCP       Objective:    Today's Vitals   01/18/22 1019 01/18/22 1020  BP: (!) 119/97   Pulse: 61   Temp: 98.6 F (37 C)   TempSrc: Oral   SpO2: 96%   Weight: 171 lb 11.2 oz (77.9 kg)   Height: '5\' 8"'$  (1.727 m)   PainSc:  0-No pain   Body mass index is 26.11 kg/m.     01/18/2022   10:21 AM 01/17/2022    5:33 PM 01/17/2022    3:29 PM 09/22/2021    4:47 AM 09/21/2021    3:01 PM 12/30/2020    2:25 PM 12/23/2020    2:27 PM  Advanced Directives  Does Patient Have a Medical Advance Directive? No No No Yes Yes Yes Yes  Type of Advance Directive    Living will Living will [place];Living will [place];Living will  Does patient want to make changes to medical advance directive?    No - Patient declined  No - Patient declined   Copy of [place] in Chart?      No - copy requested No - copy requested  Would patient like information on creating a medical advance directive? No - Patient declined No - Patient declined No - Patient declined        Current Medications (verified) Facility-Administered Encounter Medications as of 01/18/2022  Medication   acetaminophen (TYLENOL) tablet 650 mg   apixaban (ELIQUIS) tablet 5 mg   clotrimazole (LOTRIMIN) 1 % cream   dextrose 5 % in lactated ringers infusion   dextrose 50 % solution 0-50 mL   escitalopram (LEXAPRO) tablet 10 mg   fluconazole (DIFLUCAN) tablet 150 mg    insulin aspart (novoLOG) injection 0-6 Units   insulin aspart (novoLOG) injection 10 Units   insulin glargine-yfgn (SEMGLEE) injection 30 Units   lactated ringers infusion   Outpatient Encounter Medications as of 01/18/2022  Medication Sig   Accu-Chek Softclix Lancets lancets Use as instructed   acetaminophen (TYLENOL) 325 MG tablet Take 2 tablets (650 mg total) by mouth every 4 (four) hours as needed for headache or mild pain.   apixaban (ELIQUIS) 5 MG TABS tablet Take 1 tablet by mouth twice daily (Patient not taking: Reported on 01/17/2022)   escitalopram (LEXAPRO) 10 MG tablet Take 1 tablet (10 mg total) by mouth daily. (Patient not taking: Reported on 01/17/2022)   glucose blood (ACCU-CHEK GUIDE) test strip Use as instructed   hydrOXYzine (ATARAX) 10 MG/5ML syrup Take 5 mLs (10 mg total) by mouth 3 (three) times daily as needed for anxiety. (Patient not taking: Reported on 01/17/2022)   insulin aspart (NOVOLOG FLEXPEN) 100 UNIT/ML FlexPen INJECT 18 UNITS UNDER THE SKIN THREE TIMES DAILY WITH MEALS (Patient not taking: Reported on 01/17/2022)   Insulin Pen Needle 31G X 5 MM MISC Use one pen needle three times daily. Dx E11.49   lactulose (CHRONULAC) 10 GM/15ML solution Take 45 mLs (30  g total) by mouth 3 (three) times daily. (Patient not taking: Reported on 01/17/2022)   metFORMIN (GLUCOPHAGE) 500 MG tablet Take 1 tablet (500 mg total) by mouth 2 (two) times daily with a meal. (Patient not taking: Reported on 01/17/2022)   polyethylene glycol (MIRALAX / GLYCOLAX) 17 g packet Take 17 g by mouth daily. (Patient not taking: Reported on 01/17/2022)   REPATHA SURECLICK 962 MG/ML SOAJ INJECT 1 PEN INTO THE SKIN EVERY 14 (FOURTEEN) DAYS. (Patient not taking: Reported on 01/17/2022)   SOLIQUA 100-33 UNT-MCG/ML SOPN Inject 40 Units into the skin daily. (Patient not taking: Reported on 01/17/2022)   valACYclovir (VALTREX) 1000 MG tablet Take 1 tablet (1,000 mg total) by mouth 2 (two) times daily. (Patient not  taking: Reported on 09/21/2021)   vitamin B-12 (CYANOCOBALAMIN) 100 MCG tablet Take 100 mcg by mouth daily. (Patient not taking: Reported on 09/21/2021)    Allergies (verified) Morpholine salicylate, Penicillins, Morphine and related, Sglt2 inhibitors, and Statins   History: Past Medical History:  Diagnosis Date   Acute diastolic congestive heart failure (HCC)    AKI (acute kidney injury) ([place])    Aortic valve stenosis s/p AVR 2018   Echo 2/22: Poor acoustic windows, EF 50-55, mild LVH, normal RVSF, mild MR, trivial AI, no AS, no pericardial effusion   Atrial fibrillation (HCC) - post-op CABG    04/2016 CHA2DS2VAS score = 5   Atypical nevi    Coronary artery disease s/p 2 vessel CABG    Depression    "years ago"   Diabetes mellitus    Dyspnea    in the past    GERD (gastroesophageal reflux disease)    Heart murmur    Hepatic cirrhosis ([place]) 06/29/2017   History of kidney stones    Hyperlipidemia    hx of transaminitis secondary to statin and he has decided not to use statins secondary to potential side effects.   Hypertension    Nephrolithiasis    Osteoarthritis, knee    Pericardial effusion a. subxiphoid pericardial window on 04/21/2016 04/28/2016   Peripheral neuropathic pain 05/12/2016   Sleep apnea     Central apnea. 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 2-D echo was normal no embolic source found.   Past Surgical History:  Procedure Laterality Date   AORTIC VALVE REPLACEMENT N/A 03/30/2016   Procedure: AORTIC VALVE REPLACEMENT (AVR);  Surgeon: [name], MD;  Location: [facility];  Service: Open Heart Surgery;   Laterality: N/A;   CARDIAC CATHETERIZATION N/A 03/15/2016   Procedure: Right/Left Heart Cath and Coronary Angiography;  Surgeon: Burnell Blanks, MD;  Location: [facility];  Service: Cardiovascular;  Laterality: N/A;   CLIPPING OF ATRIAL APPENDAGE N/A 03/30/2016   Procedure: CLIPPING OF LEFT ATRIAL APPENDAGE;  Surgeon: [name], MD;  Location: [facility];  Service: Open Heart Surgery;  Laterality: N/A;   CORONARY ARTERY BYPASS GRAFT N/A 03/30/2016   Procedure: CORONARY ARTERY BYPASS GRAFTING (CABG) Times Two;  Surgeon: [name], MD;  Location: [facility];  Service: Open Heart Surgery;  Laterality: N/A;   KNEE ARTHROSCOPY W/ PARTIAL MEDIAL MENISCECTOMY  05/12/2005  right, performed by Dr. French Ana for torn medial meniscus.   ROTATOR CUFF REPAIR Right 2016   STERNAL WIRES REMOVAL N/A 08/13/2017   Procedure: STERNAL WIRES REMOVAL;  Surgeon: [name], MD;  Location: [facility];  Service: Thoracic;  Laterality: N/A;   SUBXYPHOID PERICARDIAL WINDOW N/A 04/21/2016   Procedure: SUBXYPHOID PERICARDIAL WINDOW;  Surgeon: [name], MD;  Location: [facility];  Service: Thoracic;  Laterality: N/A;   TEE WITHOUT CARDIOVERSION N/A 03/30/2016   Procedure: TRANSESOPHAGEAL ECHOCARDIOGRAM (TEE);  Surgeon: [name], MD;  Location: [facility];  Service: Open Heart Surgery;  Laterality: N/A;   TEE WITHOUT CARDIOVERSION N/A 04/21/2016   Procedure: TRANSESOPHAGEAL ECHOCARDIOGRAM (TEE);  Surgeon: [name], MD;  Location: [facility];  Service: Thoracic;  Laterality: N/A;   Family History  Problem Relation Age of Onset   Hypertension Mother    Diabetes Mother    Alcohol abuse Father    Social History   Socioeconomic History   Marital status: Divorced    Spouse name: Not on file   Number of children: 1   Years of education: 2y college   Highest education level: Not on file  Occupational History   Occupation:  Medicaid /disability    Employer: UNEMPLOYED     Comment: following stroke in 2007  Tobacco Use   Smoking status: Never   Smokeless tobacco: Never  Vaping Use   Vaping Use: Never used  Substance and Sexual Activity   Alcohol use: Yes    Alcohol/week: 1.0 standard drink of alcohol    Types: 1 Cans of beer per week    Comment: occasional   Drug use: No   Sexual activity: Not Currently  Other Topics Concern   Not on file  Social History Narrative   Single but has a girlfriend.    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       Patient has medications disability post stroke.            Social Determinants of Health   Financial Resource Strain: Medium Risk (01/18/2022)   Overall Financial Resource Strain (CARDIA)    Difficulty of Paying Living Expenses: Somewhat hard  Food Insecurity: Food Insecurity Present (01/18/2022)   Hunger Vital Sign    Worried About Running Out of Food in the Last Year: Sometimes true    Ran Out of Food in the Last Year: Sometimes true  Transportation Needs: Unmet Transportation Needs (01/18/2022)   PRAPARE - Hydrologist (Medical): Yes    Lack of Transportation (Non-Medical): Yes  Physical Activity: Inactive (01/18/2022)   Exercise Vital Sign    Days of Exercise per Week: 0 days    Minutes of Exercise per Session: 0 min  Stress: Stress Concern Present (01/18/2022)   [place]    Feeling of Stress : Very much  Social Connections: Moderately Isolated (01/18/2022)   Social Connection and Isolation Panel [NHANES]    Frequency of Communication with Friends and Family: More than three times a week    Frequency of Social Gatherings with Friends and Family: Once a week    Attends Religious Services: 1 to 4 times per year    Active Member of [employer] or Organizations: No    Attends Archivist Meetings: Never    Marital Status: Divorced    Tobacco Counseling Counseling  given: Not Answered   Clinical Intake:  Pre-visit preparation completed: Yes  Pain : No/denies pain Pain Score: 0-No pain     Nutritional Risks: None Diabetes: Yes  How often do you need to have someone help you when you read instructions, pamphlets, or other written materials from your doctor or pharmacy?: 1 - Never What is the last grade level you completed in school?: 12th grade  Diabetic?Yes  Interpreter Needed?: No  Information entered by :: Corey Skains Nahzir Pohle,cma 01/18/22 10:20am   Activities of Daily Living    01/18/2022   10:21 AM 01/17/2022    3:28 PM  In your present state of health, do you have any difficulty performing the following activities:  Hearing? 1 1  Vision? 0 0  Difficulty concentrating or making decisions? 1 1  Walking or climbing stairs? 1 1  Dressing or bathing? 1 1  Doing errands, shopping? 1 1    Patient Care Team: [name], MD as PCP - General [name] Annita Brod, MD as PCP - Cardiology (Cardiology) Sherlynn Stalls, MD as Consulting Physician (Ophthalmology)  Indicate any recent Medical Services you may have received from other than [facility] providers in the past year (date may be approximate).     Assessment:   This is a routine wellness examination for Mike.  Hearing/Vision screen No results found.  Dietary issues and exercise activities discussed:     Goals Addressed   None    Depression Screen    01/18/2022   10:21 AM 01/17/2022    3:28 PM 12/23/2020    2:05 PM 10/20/2020    9:23 AM 08/24/2020   10:56 AM 07/05/2020    2:40 PM 03/10/2020    4:14 PM  PHQ 2/9 Scores  PHQ - 2 Score 0 0 2 0 '1 4 5  '$ PHQ- 9 Score   6 0 '7 5 21    '$ Fall Risk    01/18/2022   10:21 AM 01/17/2022    3:18 PM 09/21/2021    3:01 PM 12/30/2020    2:26 PM 12/23/2020    2:03 PM  Fall Risk   Falls in the past year? '1 1 1 1 1  '$ Number falls in past yr: 1 1 0 1 1  Injury with Fall? '1 1 1 '$ 0 0  Risk for fall due to : Impaired balance/gait Impaired  balance/gait Impaired balance/gait Impaired balance/gait Impaired balance/gait  Follow up Falls evaluation completed;Falls prevention discussed Falls evaluation completed;Falls prevention discussed Falls evaluation completed;Falls prevention discussed Falls evaluation completed Falls evaluation completed;Education provided    FALL RISK PREVENTION PERTAINING TO THE HOME:  Any stairs in or around the home?  Patient refusal: did not answer question If so, are there any without handrails? Patient refusal: did not answer question Home free of loose throw rugs in walkways, pet beds, electrical cords, etc? Patient refusal: did not answer question Adequate lighting in your home to reduce risk of falls? Patient refusal: did not answer question  ASSISTIVE DEVICES UTILIZED TO PREVENT FALLS:  Life alert? Patient refusal: did not answer question Use of a cane, walker or w/c? Yes  Grab bars in the bathroom? Patient refusal: did not answer question Shower chair or bench in shower? Patient refusal: did not answer question Elevated toilet seat or a handicapped toilet? Patient refusal: did not answer question  TIMED UP AND GO:  Was the test performed?  N/A .  Length of time to ambulate 10 feet: 0 sec.   Gait unsteady with use of assistive device, provider informed and education provided.  Cognitive Function:        Immunizations Immunization History  Administered Date(s) Administered   Influenza Split 12/31/2005, 01/01/2007, 05/02/2010, 04/17/2011   Influenza Whole 01/03/2006, 01/01/2007   Influenza,inj,Quad PF,6+ Mos 12/03/2019   PPD Test 11/19/2018   Pneumococcal Conjugate-13 01/19/2014   Pneumococcal Polysaccharide-23 04/17/2011   Td 05/18/2006   Tdap 05/18/2006, 12/14/2016    TDAP status: Up to date  Flu Vaccine status: Due, Education has been provided regarding the importance of this vaccine. Advised may receive this vaccine at local pharmacy or Health Dept. Aware to provide a copy  of the vaccination record if obtained from local pharmacy or Health Dept. Verbalized acceptance and understanding.  Pneumococcal vaccine status: Due, Education has been provided regarding the importance of this vaccine. Advised may receive this vaccine at local pharmacy or Health Dept. Aware to provide a copy of the vaccination record if obtained from local pharmacy or Health Dept. Verbalized acceptance and understanding.  Covid-19 vaccine status: Information provided on how to obtain vaccines.   Qualifies for Shingles Vaccine? No   Zostavax completed No   Shingrix Completed?: No.    Education has been provided regarding the importance of this vaccine. Patient has been advised to call insurance company to determine out of pocket expense if they have not yet received this vaccine. Advised may also receive vaccine at local pharmacy or Health Dept. Verbalized acceptance and understanding.  Screening Tests Health Maintenance  Topic Date Due   COVID-19 Vaccine (1) Never done   Zoster Vaccines- Shingrix (1 of 2) Never done   COLONOSCOPY (Pts 45-62yr Insurance coverage will need to be confirmed)  Never done   Pneumonia Vaccine 73 Years old (3 - PPSV23 or PCV20) 04/16/2016   Diabetic kidney evaluation - Urine ACR  07/11/2019   OPHTHALMOLOGY EXAM  09/04/2020   LIPID PANEL  04/09/2021   INFLUENZA VACCINE  10/11/2021   FOOT EXAM  12/30/2021   HEMOGLOBIN A1C  04/19/2022   COLON CANCER SCREENING ANNUAL FOBT  01/18/2023   Diabetic kidney evaluation - GFR measurement  01/19/2023   Medicare Annual Wellness (AWV)  01/19/2023   TETANUS/TDAP  12/15/2026   Hepatitis C Screening  Completed   HPV VACCINES  Aged Out    Health Maintenance  Health Maintenance Due  Topic Date Due   COVID-19 Vaccine (1) Never done   Zoster Vaccines- Shingrix (1 of 2) Never done   COLONOSCOPY (Pts 45-470yrInsurance coverage will need to be confirmed)  Never done   Pneumonia Vaccine 6524Years old (3 - PPSV23 or PCV20)  04/16/2016   Diabetic kidney evaluation - Urine ACR  07/11/2019   OPHTHALMOLOGY EXAM  09/04/2020   LIPID PANEL  04/09/2021   INFLUENZA VACCINE  10/11/2021   FOOT EXAM  12/30/2021    Colorectal cancer screening: Type of screening: FOBT/FIT. Completed 01/17/2022. Repeat every 1 years  Lung Cancer Screening: (Low Dose CT Chest recommended if Age 73-80ears, 30 pack-year currently smoking OR have quit w/in 15years.) does not qualify.   Lung Cancer Screening Referral: N/A  Additional Screening:  Hepatitis C Screening: does not qualify; Completed 11/08/2007,06/28/2017,09/28/2021  Vision Screening: Recommended annual ophthalmology exams for early detection of glaucoma and other disorders of the eye. Is the patient up to date with their annual eye exam?  Patient refusal: did not answer question Who is the provider or what is the name of the office in which the patient attends annual eye exams? Patient refusal: did not answer question If pt is not  established with a provider, would they like to be referred to a provider to establish care? Patient refusal: did not answer question.   Dental Screening: Recommended annual dental exams for proper oral hygiene  Community Resource Referral / Chronic Care Management: CRR required this visit?  Yes : Has already been addressed by Mrs.Neoma Laming  CCM required this visit?  No      Plan:     I have personally reviewed and noted the following in the patient's chart:   Medical and social history Use of alcohol, tobacco or illicit drugs  Current medications and supplements including opioid prescriptions. Patient is not currently taking opioid prescriptions. Functional ability and status Nutritional status Physical activity Advanced directives List of other physicians Hospitalizations, surgeries, and ER visits in previous 12 months Vitals Screenings to include cognitive, depression, and falls Referrals and appointments  In addition, I have  reviewed and discussed with patient certain preventive protocols, quality metrics, and best practice recommendations. A written personalized care plan for preventive services as well as general preventive health recommendations were provided to patient.     Kerin Perna, [place]   01/18/2022   Nurse Notes: Face-To-Face visit  Mr. Haughey , Thank you for taking time to come for your Medicare Wellness Visit. I appreciate your ongoing commitment to your health goals. Please review the following plan we discussed and let me know if I can assist you in the future.   These are the goals we discussed:  Goals   None     This is a list of the screening recommended for you and due dates:  Health Maintenance  Topic Date Due   COVID-19 Vaccine (1) Never done   Zoster (Shingles) Vaccine (1 of 2) Never done   Colon Cancer Screening  Never done   Pneumonia Vaccine (3 - PPSV23 or PCV20) 04/16/2016   Yearly kidney health urinalysis for diabetes  07/11/2019   Eye exam for diabetics  09/04/2020   Lipid (cholesterol) test  04/09/2021   Flu Shot  10/11/2021   Complete foot exam   12/30/2021   Hemoglobin A1C  04/19/2022   Stool Blood Test  01/18/2023   Yearly kidney function blood test for diabetes  01/19/2023   Medicare Annual Wellness Visit  01/19/2023   Tetanus Vaccine  12/15/2026   Hepatitis C Screening: USPSTF Recommendation to screen - Ages 18-79 yo.  Completed   HPV Vaccine  Aged Out

## 2022-01-18 NOTE — Progress Notes (Addendum)
HD#1 Subjective:   Summary: Mark Cabrera is a 71 y.o. person living with a history of  DM, HLD, heart failure, CAD status post CABG, AFib, sleep apnea, compensated cirrhosis, and multiple CVAs  who presented with hyperglycemia.  Overnight Events: No acute events overnight.  On rounds today, patient initially states all he wants are some peaches and for [place] to leave him alone.  He says that he is dying and death would be better than being here or anywhere else.  He is frustrated that his daughter "wants to put him somewhere."  Seems to think no one cares about him.  Wants to get up and walk out of here.  He states that his roommate Mark Cabrera and his daughter wanted him to come to the hospital.  He does say he was using his insulin at home but was not measuring his blood sugar.  He states that he wants to go to a nursing home, but that if he had the choice he would die soon.  He wants to leave AMA and go somewhere to die.  States that he does not keep up with the months even when his brain was working.  He says that all he knows is on the fourth of each month his disability check comes in.   Objective:  Vital signs in last 24 hours: Vitals:   01/18/22 0530 01/18/22 0556 01/18/22 0619 01/18/22 1004  BP: (!) 100/51  108/74   Pulse: 80  82   Resp: 18  18   Temp:  98.2 F (36.8 C)  98.3 F (36.8 C)  TempSrc:  Oral  Oral  SpO2: 97%  100%   Weight:      Height:       Supplemental O2: Room Air SpO2: 100 %   Physical Exam:  Constitutional: well-appearing elderly male sitting in hospital bed, in no acute distress HENT: normocephalic atraumatic, mucous membranes moist Eyes: conjunctiva non-erythematous Neck: supple Pulmonary/Chest: normal work of breathing on room air Neurological: alert & oriented to self, place, year but not month Psych: Depressed, irritable though noncombative.  Filed Weights   01/17/22 1731  Weight: 77.6 kg     Intake/Output Summary (Last 24 hours) at 01/18/2022  1048 Last data filed at 01/18/2022 0950 Gross per 24 hour  Intake 329.22 ml  Output 550 ml  Net -220.78 ml   Net IO Since Admission: -220.78 mL [01/18/22 1048]  Pertinent Labs:    Latest Ref Rng & Units 01/17/2022    7:55 PM 01/17/2022    7:49 PM 01/17/2022    4:08 PM  CBC  WBC 4.0 - 10.5 K/uL  2.7  3.4   Hemoglobin 13.0 - 17.0 g/dL 13.0 - 17.0 g/dL 11.2    11.2  10.9  10.7   Hematocrit 39.0 - 52.0 % 39.0 - 52.0 % 33.0    33.0  31.2  30.0   Platelets 150 - 400 K/uL  189  244        Latest Ref Rng & Units 01/18/2022    5:20 AM 01/18/2022    1:00 AM 01/17/2022    7:55 PM  CMP  Glucose 70 - 99 mg/dL 256  208  669   BUN 8 - 23 mg/dL '13  13  19   '$ Creatinine 0.61 - 1.24 mg/dL 0.84  0.75  1.00   Sodium 135 - 145 mmol/L 134  129  124    124   Potassium 3.5 - 5.1 mmol/L 3.5  2.8  4.0    4.0   Chloride 98 - 111 mmol/L 102  99  92   CO2 22 - 32 mmol/L 22  20    Calcium 8.9 - 10.3 mg/dL 8.8  8.0      Imaging: DG Chest 2 View  Result Date: 01/17/2022 CLINICAL DATA:  Altered mental status EXAM: CHEST - 2 VIEW COMPARISON:  09/24/2021 FINDINGS: Valve prosthesis and atrial appendage clip. Borderline to mild cardiomegaly. No acute airspace disease, pleural effusion, or pneumothorax. IMPRESSION: No active cardiopulmonary disease. Borderline to mild cardiomegaly. Electronically Signed   By: Donavan Foil M.D.   On: 01/17/2022 20:48   CT ABDOMEN PELVIS W CONTRAST  Result Date: 01/17/2022 CLINICAL DATA:  Fever and diarrhea. EXAM: CT ABDOMEN AND PELVIS WITH CONTRAST TECHNIQUE: Multidetector CT imaging of the abdomen and pelvis was performed using the standard protocol following bolus administration of intravenous contrast. RADIATION DOSE REDUCTION: This exam was performed according to the departmental dose-optimization program which includes automated exposure control, adjustment of the mA and/or kV according to patient size and/or use of iterative reconstruction technique. CONTRAST:  25m  OMNIPAQUE IOHEXOL 350 MG/ML SOLN COMPARISON:  September 24, 2021 FINDINGS: Lower chest: Mild atelectasis is seen within the bilateral lung bases. Hepatobiliary: The liver is cirrhotic in appearance. Diffuse fatty infiltration of the liver parenchyma is noted. No focal liver abnormality is seen. No gallstones, gallbladder wall thickening, or biliary dilatation. Pancreas: Unremarkable. No pancreatic ductal dilatation or surrounding inflammatory changes. Spleen: The spleen is mildly enlarged. Adrenals/Urinary Tract: Adrenal glands are unremarkable. Kidneys are normal, without renal calculi, focal lesion, or hydronephrosis. Mild, stable diffuse urinary bladder wall thickening is noted. Stomach/Bowel: Stomach is within normal limits. Appendix appears normal. No evidence of bowel wall thickening, distention, or inflammatory changes. Vascular/Lymphatic: Aortic atherosclerosis. No enlarged abdominal or pelvic lymph nodes. Reproductive: Prostate gland is mildly enlarged. Other: A 2.6 cm x 1.6 cm fat containing left inguinal hernia is seen. No abdominopelvic ascites. Musculoskeletal: No acute or significant osseous findings. IMPRESSION: 1. Hepatic steatosis with cirrhosis. 2. Mild splenomegaly. 3. Small fat containing left inguinal hernia. 4. Aortic atherosclerosis. Aortic Atherosclerosis (ICD10-I70.0). Electronically Signed   By: TVirgina NorfolkM.D.   On: 01/17/2022 20:42   CT HEAD WO CONTRAST (5MM)  Result Date: 01/17/2022 CLINICAL DATA:  Diarrhea and fatigue. EXAM: CT HEAD WITHOUT CONTRAST TECHNIQUE: Contiguous axial images were obtained from the base of the skull through the vertex without intravenous contrast. RADIATION DOSE REDUCTION: This exam was performed according to the departmental dose-optimization program which includes automated exposure control, adjustment of the mA and/or kV according to patient size and/or use of iterative reconstruction technique. COMPARISON:  November 08, 2020 FINDINGS: Brain: There is  mild cerebral atrophy with widening of the extra-axial spaces and ventricular dilatation. There are areas of decreased attenuation within the Janica Eldred matter tracts of the supratentorial brain, consistent with microvascular disease changes. A small chronic left occipital lobe infarct is seen. Vascular: There is moderate to marked severity calcification of the bilateral cavernous carotid arteries. Skull: Normal. Negative for fracture or focal lesion. Sinuses/Orbits: Postsurgical changes are again seen within the left globe. Other: None. IMPRESSION: 1. No acute intracranial abnormality. 2. Small chronic left occipital lobe infarct. 3. Generalized cerebral atrophy with widening of the extra-axial spaces and ventricular dilatation. Electronically Signed   By: TVirgina NorfolkM.D.   On: 01/17/2022 20:36    Assessment/Plan:   Principal Problem:   Hyperglycemia   Patient Summary: HTAYVIAN HOLYCROSSis a  71 y.o. person living with a history of  DM, HLD, heart failure, CAD status post CABG, AFib, sleep apnea, compensated cirrhosis, and multiple CVAs  who presented with hyperglycemia and admitted for glucose management.   #Severe Hyperglycemia Patient presented with glucose over 600 and A1c greater than 14. UA positive for glucosuria and ketonuria, beta-hydroxybutyric acid elevated at 1.52.  Placed on Endo tool, glucose dropped to 201, now 318. Anion gap decreased and wnl x 2 now. Will transition off endotool with breakfast. Suspect hyperglycemia result of medication noncompliance.  Patient states he wants to die and his sister reports that he has not been taking medication. - Novolog 10 u with meals + SSI - Semglee 30 u qhs - Discontinue endotool - Q1hr glucose Checks  - BMP Q4 Hours   #Diarrhea  Multiple day history of loose stools concerning for possible GI infection.  Pt was given lactulose on last discharge, but unsure if pt has been taking.  Afebrile, hemodynamically stable.  Without leukocytosis. -  Follow up GI Panel  - Call daughter to verify whether patient has been taking lactulose   #Hyponatremia 2/2 Hyperglycemia  Patient on maintenance LR.  Sodium improving.  Serum osmolality 305.  Expect this to improve with correction of hyperglycemia. -Daily BMP   #Penile Lesions Pt reporting these lesions have been present for more than six months. 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 He was treated with Valtrex, however diagnosis was then shifted to balanitis and he was treated with diflucan and topical lotrimin with resolution of symptoms. Foreskin was retractable, so no concern for phimosis or urgent urology consult.  Will start Diflucan and topical Lotrimin.  If no response, will consider psoriasis as possible etiology. - Diflucan and topical lotrimin    #HFrEF EF 50-55% NYHA I  Pt follows with [facility], and last cardiology appointment was 04/29/20. Echo in 04/11/19 showed a EF of 50-55%, LV had low normal function, and left ventricular internal cavity size. Unsure as to why pt is not on GDMT management, though it appears he was intermittently hypotensive and orthostatic last admission. -monitor BP and reconsider GDMT if appropriate   #Hepatic Cirrhosis  #Chronic Leukopenia Was initially noted in 2016, likely in the setting of hepatic cirrhosis. WBC count is currently 2.7. Pt has not been febrile since emergency department. Blood smear on last admission showed pancytopenia without blasts, and occasional reactive plasmacytoid lymphs but no hairy cells. Hepatitis serologies were checked as well which were non-reactive. Unclear etiology of cirrhosis, last ultrasound was done in 2021 with findings consisted with compensated cirrhosis. AST/ALT within normal limits currently. Per chart review, was reportedly following with [facility], however most appointments have been missed and pt declined to reschedule.  - continue to monitor for signs  of decompensated cirrhosis   #Paroxysmal Atrial Fibrillation  Pt has history of A-Fib, and is prescribed Eliquis. CHADVASC score is 7, correlating with a 15.7% risk of stroke in a year. In afib without RVR on admission. - Continue cardiac monitoring - Eliquis '5mg'$    #Normocytic Anemia Pt has Hb of 11.2. Likely anemia of chronic disease. Will continue to monitor.   - Daily CBC   #Code Status  Pt requested to be DNR, and stated he had signed papers previously documetning his wishes. No documents seen in the chart. Daughter is POA, and unable to contact her at night.  -will contact daughter to discuss   Diet: Carb modified  VTE: Eliquis IVF: LR,125cc/hr Code: DNR   Prior to Admission Living Arrangement: Home, living   Anticipated Discharge Location: SNF Barriers to Discharge: Medical Management   Dispo: Admit patient to Inpatient with expected length of stay greater than 2 midnights.   Linward Natal MD Internal Medicine Resident PGY-1 Please contact the on call pager after 5 pm and on weekends at [phone].

## 2022-01-18 NOTE — ED Notes (Signed)
Lab was contacted to collect the cmp/bmp due to the pt being a difficult stick.

## 2022-01-18 NOTE — Hospital Course (Addendum)
11/15: thinks it's night still. Brother junior called and he said "he never comes this time of night" encouraged him to work with pt.   11/14: says he just has back pain, doesn't think he ahs any sores anywhere. Has pad over sacrum. 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 Has no sense or purpose anymore and says he feels ready to transition to the next place. 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 Seems happy when talking about this. Said he was painting still before he came in--was drawing because painting is too physically demanding. Doesn't think being able to draw here would being helpful. "Give me a pill so I can leave out of here."     11/10: no chest pain or pain anywhere. Feels like he is struggling to breathe, wants oxygen. [place] put on while we were in room, feels better instantly. Says we better hurry up getting him to a nursing home, he'll be stiff in this bed otherwise.    11/08: won't stop talking about peaches. Says he's dying, says it's better than being here or anywhere else. Frustrated that daughter wants to "put him somewhere." Seems to think no one cares about him. Wants to get up and walk out of here. Seems pretty down. His roommate faye and daughter agreed he needed to come to hospital. Says he was using his insulin at home but not measuring BG. Maybe wants to go to nursing home? "If he had his choice" he would die soon. Wants to sign out ama and go somewhere to die. Seems really unhappy with daughter and her taking care of him. Oriented to self, place, year but not month. "I didn't keep up with the month when my brain was working. All I know is on the 4th of each month my disability check comes in."  Daughter phone call 11/8 - fallen several times recently, fell on his left hip yesterday, not showering or eating well, memory care? Fl2 form expired  DNR  Legal guardian

## 2022-01-18 NOTE — Inpatient Diabetes Management (Signed)
Inpatient Diabetes Program Recommendations  AACE/ADA: New Consensus Statement on Inpatient Glycemic Control (2015)  Target Ranges:  Prepandial:   less than 140 mg/dL      Peak postprandial:   less than 180 mg/dL (1-2 hours)      Critically ill patients:  140 - 180 mg/dL   Lab Results  Component Value Date   GLUCAP 312 (H) 01/18/2022   HGBA1C >14.0 01/17/2022    Review of Glycemic Control  Diabetes history: DM 2 Outpatient Diabetes medications: Humalog 18 units tid, metformin bid Current orders for Inpatient glycemic control:  Semglee 30 units Daily Novolog 0-6 units tid Novolog 10 units tid meal coverage  A1c  >14% this admission  Spoke with pt at bedside regarding A1c level >14% and glucose control at home. Pt reports not being able to go to pharmacy and get medications. Pt also saying he is not able to take care of himself. Pt wanting to be taken care of given his medications like here in the hospital. Pt reports renting a room with an older woman and states she is not well herself. Pt does not seem to want to care for himself. I am unsure of what to do other than encourage him to self manage his Diabetes. Could place [facility] consult to evaluate pt but I am unsure if pt is aware of what is needed to be evaluated and if he qualifies to be able to be placed in such a facility.   Thanks,  Tama Headings RN, MSN, BC-ADM Inpatient Diabetes Coordinator Team Pager [phone] (8a-5p)

## 2022-01-18 NOTE — ED Notes (Signed)
Will give him medications when he returns

## 2022-01-18 NOTE — ED Notes (Signed)
And didn't get medication until approximately 3-5 minutes ago.

## 2022-01-18 NOTE — Addendum Note (Signed)
Addended by: Iona Beard on: 01/18/2022 12:58 PM   Modules accepted: Level of Service

## 2022-01-18 NOTE — ED Notes (Signed)
Pt is requesting a stronger pain medication for his cramping leg.

## 2022-01-18 NOTE — ED Notes (Signed)
Pt has repeatedly gotten out of bed setting his alarm off. Pt is very confused and tries to turn his monitors and alarms off. I have been in his room approximately every 3-5 minutes resetting the alarm and laying him back down.

## 2022-01-18 NOTE — Progress Notes (Addendum)
Pt arrived to 4NP from the ED. Pt sat in chair, received CHG bath and clean linens. New electrodes placed for heart monitor. At this time, telemetry reading SVT's ranging from the 120's to the 160's and occasionally dropping to the 80's. Checked pt's pulse during tachycardia, and the reading was accurate. Vitals otherwise WNL and CBG 274. Reached out to rounding team. Orders for EKG. Providers at the bedside, read EKG, gave orders for '5mg'$  IV Metoprolol and STAT BMP. Will relay information to nightshift to continue monitoring.   Justice Rocher, RN

## 2022-01-19 DIAGNOSIS — R739 Hyperglycemia, unspecified: Secondary | ICD-10-CM | POA: Diagnosis not present

## 2022-01-19 LAB — CBC
HCT: 27.9 % — ABNORMAL LOW (ref 39.0–52.0)
Hemoglobin: 9.6 g/dL — ABNORMAL LOW (ref 13.0–17.0)
MCH: 31.2 pg (ref 26.0–34.0)
MCHC: 34.4 g/dL (ref 30.0–36.0)
MCV: 90.6 fL (ref 80.0–100.0)
Platelets: 169 10*3/uL (ref 150–400)
RBC: 3.08 MIL/uL — ABNORMAL LOW (ref 4.22–5.81)
RDW: 14.5 % (ref 11.5–15.5)
WBC: 2.7 10*3/uL — ABNORMAL LOW (ref 4.0–10.5)
nRBC: 0 % (ref 0.0–0.2)

## 2022-01-19 LAB — GLUCOSE, CAPILLARY
Glucose-Capillary: 303 mg/dL — ABNORMAL HIGH (ref 70–99)
Glucose-Capillary: 237 mg/dL — ABNORMAL HIGH (ref 70–99)
Glucose-Capillary: 381 mg/dL — ABNORMAL HIGH (ref 70–99)
Glucose-Capillary: 257 mg/dL — ABNORMAL HIGH (ref 70–99)
Glucose-Capillary: 120 mg/dL — ABNORMAL HIGH (ref 70–99)

## 2022-01-19 LAB — CBC WITH DIFFERENTIAL/PLATELET
Abs Immature Granulocytes: 0.03 10*3/uL (ref 0.00–0.07)
Basophils Absolute: 0 10*3/uL (ref 0.0–0.1)
Basophils Relative: 0 %
Eosinophils Absolute: 0 10*3/uL (ref 0.0–0.5)
Eosinophils Relative: 2 %
HCT: 26.3 % — ABNORMAL LOW (ref 39.0–52.0)
Hemoglobin: 8.9 g/dL — ABNORMAL LOW (ref 13.0–17.0)
Immature Granulocytes: 1 %
Lymphocytes Relative: 27 %
Lymphs Abs: 0.7 10*3/uL (ref 0.7–4.0)
MCH: 30.6 pg (ref 26.0–34.0)
MCHC: 33.8 g/dL (ref 30.0–36.0)
MCV: 90.4 fL (ref 80.0–100.0)
Monocytes Absolute: 0.3 10*3/uL (ref 0.1–1.0)
Monocytes Relative: 10 %
Neutro Abs: 1.6 10*3/uL — ABNORMAL LOW (ref 1.7–7.7)
Neutrophils Relative %: 60 %
Platelets: 165 10*3/uL (ref 150–400)
RBC: 2.91 MIL/uL — ABNORMAL LOW (ref 4.22–5.81)
RDW: 14.2 % (ref 11.5–15.5)
WBC: 2.6 10*3/uL — ABNORMAL LOW (ref 4.0–10.5)
nRBC: 0 % (ref 0.0–0.2)

## 2022-01-19 LAB — BASIC METABOLIC PANEL
Anion gap: 8 (ref 5–15)
BUN: 10 mg/dL (ref 8–23)
CO2: 22 mmol/L (ref 22–32)
Calcium: 8.2 mg/dL — ABNORMAL LOW (ref 8.9–10.3)
Chloride: 104 mmol/L (ref 98–111)
Creatinine, Ser: 0.95 mg/dL (ref 0.61–1.24)
GFR, Estimated: >60 mL/min (ref >60)
Glucose, Bld: 218 mg/dL — ABNORMAL HIGH (ref 70–99)
Potassium: 3 mmol/L — ABNORMAL LOW (ref 3.5–5.1)
Sodium: 134 mmol/L — ABNORMAL LOW (ref 135–145)

## 2022-01-19 LAB — MAGNESIUM: Magnesium: 1.6 mg/dL — ABNORMAL LOW (ref 1.7–2.4)

## 2022-01-19 MED ORDER — INSULIN ASPART 100 UNIT/ML IJ SOLN
12.0000 [IU] | Freq: Three times a day (TID) | INTRAMUSCULAR | Status: DC
  Administered 2022-01-19 – 2022-01-24 (×12): 12 [IU] via SUBCUTANEOUS

## 2022-01-19 MED ORDER — POTASSIUM CHLORIDE CRYS ER 20 MEQ PO TBCR
40.0000 meq | EXTENDED_RELEASE_TABLET | ORAL | Status: AC
  Administered 2022-01-19 (×2): 40 meq via ORAL
  Filled 2022-01-19 (×2): qty 2

## 2022-01-19 MED ORDER — MAGNESIUM SULFATE 4 GM/100ML IV SOLN
4.0000 g | Freq: Once | INTRAVENOUS | Status: AC
  Administered 2022-01-19: 4 g via INTRAVENOUS
  Filled 2022-01-19: qty 100

## 2022-01-19 MED ORDER — POTASSIUM CHLORIDE CRYS ER 20 MEQ PO TBCR
40.0000 meq | EXTENDED_RELEASE_TABLET | Freq: Once | ORAL | Status: AC
  Administered 2022-01-19: 40 meq via ORAL
  Filled 2022-01-19 (×2): qty 2

## 2022-01-19 MED ORDER — GLUCERNA SHAKE PO LIQD
237.0000 mL | Freq: Two times a day (BID) | ORAL | Status: DC
  Administered 2022-01-19: 237 mL via ORAL

## 2022-01-19 MED ORDER — INSULIN GLARGINE-YFGN 100 UNIT/ML ~~LOC~~ SOLN
40.0000 [IU] | Freq: Every day | SUBCUTANEOUS | Status: DC
  Administered 2022-01-19 – 2022-01-20 (×2): 40 [IU] via SUBCUTANEOUS
  Filled 2022-01-19 (×3): qty 0.4

## 2022-01-19 MED ORDER — POTASSIUM CHLORIDE CRYS ER 20 MEQ PO TBCR: 40.0000 meq | EXTENDED_RELEASE_TABLET | Freq: Once | ORAL | Status: DC

## 2022-01-19 MED ORDER — FLUCONAZOLE 150 MG PO TABS
150.0000 mg | ORAL_TABLET | Freq: Once | ORAL | Status: AC
  Administered 2022-01-19: 150 mg via ORAL
  Filled 2022-01-19: qty 1

## 2022-01-19 NOTE — TOC Initial Note (Signed)
Transition of Care [facility]) - Initial/Assessment Note    Patient Details  Name: Mark Cabrera MRN: [id number] Date of Birth: [birth year]  [facility] CM/SW Contact:    Verdell Carmine, RN Phone Number: 01/19/2022, 1:12 PM  Clinical Narrative:                 [name] to daughter [name] on the phone regarding post discharge needs, was awaiting PT evaluation for recommendations. She states he has been at [facility] prior and signed himself out. She just obtained Legal Guardianship last week due to him being confused and not following up with medications. They apparently scanned in paperwork in the ED. He lives with a roommate, but he is not taking care of himself. Even he admitted to nursing that he cannot care for himself. PT just evaluated and have recommended SNF. Daughter has already called several SNFs and is sending the old FL2 to places that state they have beds.  Discussed our role and that we would obtain new FL2 to send out. She would prefer [facility], pbut is agreeable to faxing out to all area SNF.  She is concerned that he could just sign himself out again, and wanted to know more of the policies on Legal guardianship. Discussed that he cannot sign himself out. He may need memory care in coordination or after SNF stay.   TOC will follow for needs, recommendations, and transitions of Care.  Expected Discharge Plan: Skilled Nursing Facility Barriers to Discharge: Continued Medical Work up   Patient Goals and CMS Choice        Expected Discharge Plan and Services Expected Discharge Plan: [place] In-house Referral: Clinical Social Work     Living arrangements for the past 2 months: [place]                                      Prior Living Arrangements/Services Living arrangements for the past 2 months: Single Family Home Lives with:: Roommate Patient language and need for interpreter reviewed:: Yes        Need  for Family Participation in Patient Care: Yes (Comment) Care giver support system in place?: Yes (comment)   Criminal Activity/Legal Involvement Pertinent to Current Situation/Hospitalization: No - Comment as needed  Activities of Daily Living      Permission Sought/Granted      Share Information with NAME: [name], Daughter Legal Guardianship           Emotional Assessment       Orientation: : Fluctuating Orientation (Suspected and/or reported [place])   Psych Involvement: No (comment)  Admission diagnosis:  Confusion [R41.0] Skin yeast infection [B37.2] Failure to thrive (child) [R62.51] Hyperglycemia [R73.9] AKI (acute kidney injury) ([place]) [N17.9] Diarrhea, unspecified type [R19.7] Hyperosmolar hyperglycemic state (HHS) ([place]) [E11.00] Pseudohyponatremia [R79.89] Patient Active Problem List   Diagnosis Date Noted   Hyperglycemia 01/17/2022   Neutropenia (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   Balanitis 64/33/2951   Complicated urinary tract infection    Thrombocytopenia (H[facility])    Hyponatremia 11/08/2020   Left shoulder pain 10/20/2020   Dyspnea 09/23/2020   Mild cognitive impairment 08/25/2020   Hip pain, chronic, left 07/05/2020   Epididymitis 04/14/2020   Fatigue 03/18/2020   Hypotension  03/10/2020   Abdominal wall bulge 12/03/2019   Dermoid inclusion cyst 08/07/2019   Chronic left shoulder pain 05/13/2019   Depression 11/05/2018   Blurry vision, bilateral 09/20/2018   CVA (cerebral vascular accident) ([place]) 06/28/2018   Hepatic cirrhosis ([place]) 06/29/2017   Healthcare maintenance 12/16/2016   Lipoma of left upper extremity 09/12/2016   Chronic knee pain 07/04/2016   Decreased visual acuity 07/04/2016   Pleural effusion on left, s/p throacentesis 04/18/16 04/28/2016   Paroxysmal atrial fibrillation ([place]) 04/16/2016    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

## 2022-01-19 NOTE — NC FL2 (Signed)
[place] LEVEL OF CARE SCREENING TOOL     IDENTIFICATION  Patient Name: Mark Cabrera Birthdate: [birth year] Sex: male Admission Date (Current Location): 01/17/2022  [facility] and [place] Number:  Herbalist and Address:  [facility], [place] [address], [place], [place] [address]      Provider Number: [id number]  Attending Physician Name and Address:  Charise Killian, MD  Relative Name and Phone Number:       Current Level of Care: Hospital Recommended Level of Care: [place] Prior Approval Number:    Date Approved/Denied:   PASRR Number: [id number]  Discharge Plan: SNF    Current Diagnoses: Patient Active Problem List   Diagnosis Date Noted   Hyperglycemia 01/17/2022   Neutropenia ([place])    Fever, unspecified    Malnutrition of moderate degree 09/23/2021   Physical deconditioning 09/21/2021   Otitis externa 12/31/2020   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   Balanitis 67/67/2094   Complicated urinary tract infection    Thrombocytopenia ([place])    Hyponatremia 11/08/2020   Left shoulder pain 10/20/2020   Dyspnea 09/23/2020   Mild cognitive impairment 08/25/2020   Hip pain, chronic, left 07/05/2020   Epididymitis 04/14/2020   Fatigue 03/18/2020   Hypotension 03/10/2020   Abdominal wall bulge 12/03/2019   Dermoid inclusion cyst 08/07/2019   Chronic left shoulder pain 05/13/2019   Depression 11/05/2018   Blurry vision, bilateral 09/20/2018   CVA (cerebral vascular accident) ([place]) 06/28/2018   Hepatic cirrhosis ([place]) 06/29/2017   Healthcare maintenance 12/16/2016   Lipoma of left upper extremity 09/12/2016   Chronic knee pain 07/04/2016   Decreased visual acuity 07/04/2016   Pleural effusion on left, s/p throacentesis 04/18/16 04/28/2016   Paroxysmal atrial fibrillation ([place]) 04/16/2016   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/31/2016   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   CAD (coronary artery disease), native  coronary artery    Chronic diastolic heart failure (HCC)    Sleep apnea 08/02/2009   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   Dyslipidemia 05/18/2006   Hypertensive heart disease     Orientation RESPIRATION BLADDER Height & Weight     Self, Time, Situation, Place  Normal External catheter, Incontinent Weight: 171 lb (77.6 kg) Height:  '5\' 8"'$  (172.7 cm)  BEHAVIORAL SYMPTOMS/MOOD NEUROLOGICAL BOWEL NUTRITION STATUS      Continent Diet (please see discharge summary)  AMBULATORY STATUS COMMUNICATION OF NEEDS Skin   Supervision Verbally Normal                       Personal Care Assistance Level of Assistance    Bathing Assistance: Limited assistance Feeding assistance: Independent Dressing Assistance: Limited assistance Total Care Assistance: Limited assistance   Functional Limitations Info  Sight, Hearing, Speech Sight Info: Adequate Hearing Info: Adequate Speech Info: Adequate    SPECIAL CARE FACTORS FREQUENCY  PT (By licensed PT), OT (By licensed OT)     PT Frequency: 5x per week OT Frequency: 5x per week            Contractures Contractures Info: Not present    Additional Factors Info  Code Status, Allergies Code Status Info: DNR Allergies Info: Morpholine Salicylate,Penicillins,Morphine And Related,           Current Medications (01/19/2022):  This is the current hospital active medication list Current Facility-Administered Medications  Medication Dose Route Frequency Provider Last Rate Last Admin   acetaminophen (TYLENOL) tablet 650  mg  650 mg Oral Q4H PRN Katsadouros, Vasilios, MD   650 mg at 01/19/22 1209   apixaban (ELIQUIS) tablet 5 mg  5 mg Oral BID Katsadouros, Vasilios, MD   5 mg at 01/19/22 0855   clotrimazole (LOTRIMIN) 1 % cream   Topical BID Nooruddin, Saad, MD   Given at 01/19/22 0904   dextrose 50 % solution 0-50 mL  0-50 mL Intravenous PRN Henderly, Britni A, PA-C       escitalopram (LEXAPRO) tablet 10 mg  10  mg Oral Daily Katsadouros, Vasilios, MD   10 mg at 01/19/22 0854   feeding supplement (GLUCERNA SHAKE) (GLUCERNA SHAKE) liquid 237 mL  237 mL Oral BID BM Charise Killian, MD   237 mL at 01/19/22 1219   fluconazole (DIFLUCAN) tablet 150 mg  150 mg Oral Once Linward Natal, MD       insulin aspart (novoLOG) injection 0-6 Units  0-6 Units Subcutaneous TID WC Katsadouros, Vasilios, MD   5 Units at 01/19/22 1206   insulin aspart (novoLOG) injection 12 Units  12 Units Subcutaneous TID WC Rick Duff, MD   12 Units at 01/19/22 1206   insulin glargine-yfgn (SEMGLEE) injection 40 Units  40 Units Subcutaneous QHS Rick Duff, MD       metoprolol succinate (TOPROL-XL) 24 hr tablet 25 mg  25 mg Oral Daily Farrel Gordon, DO   25 mg at 01/19/22 0854   metoprolol tartrate (LOPRESSOR) injection 5 mg  5 mg Intravenous Once Farrel Gordon, DO         Discharge Medications: Please see discharge summary for a list of discharge medications.  Relevant Imaging Results:  Relevant Lab Results:   Additional Information SSN # [id number]  Vinie Sill, LCSW

## 2022-01-19 NOTE — Progress Notes (Signed)
Initial Nutrition Assessment  DOCUMENTATION CODES:   Not applicable  INTERVENTION:  - Add Glucerna Shake po BID, each supplement provides 220 kcal and 10 grams of protein  NUTRITION DIAGNOSIS:   Altered nutrition lab value related to other (see comment) (DM) as evidenced by other (comment) (FS Glucose 237-318 mg/dL. HgA1c >14%.).  GOAL:   Patient will meet greater than or equal to 90% of their needs  MONITOR:   PO intake, Supplement acceptance, Labs  REASON FOR ASSESSMENT:   Consult Assessment of nutrition requirement/status  ASSESSMENT:   71 y.o. male admits related to hyperglycemia. PMH includes: DM, HLD, HTN, CHF, CAD s/p CABG, A-fib, sleep apnea, cirrhosis, and multiple CVAs. Pt is currently receiving medical management for severe hyperglycemia.  Meds include: sliding scale insulin, Semglee (40 units), Klor-con, Mag-sulfate. Labs reviewed: Na low, K low, Mg Low. FS Glucose 237-318 mg/dL. HgA1c >14%.   The pt was lying flat in bed with eyes closed at time of assessment. He answered RD's questions but was very short with responses and did not want to talk to RD. He states that he eats what he wants to and is unsure of any wt changes. Pt with elevated blood sugar levels and is not appropriate for education at this time. Pt reports that he was eating well PTA.   NUTRITION - FOCUSED PHYSICAL EXAM:  Pt declined.   Diet Order:   Diet Order             Diet heart healthy/carb modified Room service appropriate? Yes; Fluid consistency: Thin  Diet effective now                   EDUCATION NEEDS:   Not appropriate for education at this time  Skin:  Skin Assessment: Reviewed RN Assessment  Last BM:  01/17/22  Height:   Ht Readings from Last 1 Encounters:  01/17/22 '5\' 8"'$  (1.727 m)    Weight:   Wt Readings from Last 1 Encounters:  01/17/22 77.6 kg    Ideal Body Weight:  70 kg  BMI:  Body mass index is 26 kg/m.  Estimated Nutritional Needs:   Kcal:   8295-6213 kcals  Protein:  95-115 gm  Fluid:  >/= 1.9 L  Thalia Bloodgood, RD, LDN, CNSC.

## 2022-01-19 NOTE — Evaluation (Signed)
Physical Therapy Evaluation Patient Details Name: Mark Cabrera MRN: [id number] DOB: [birth year] Today's Date: 01/19/2022  History of Present Illness  71 yo male admitted 11/7 with hyperglycemia, fall, irritability, diarrhea. PMHx: CVA, depression, CAD, CABG, AVR, HLD, DM, HTN  Clinical Impression  Pt pleasant with noted confusion and decreased memory. Pt stating he was renting a room but became "too much" for the lady he was staying with. Pt unable to state if he was living somewhere else PTA or if he has assist at D/C. PT with impaired balance, cognition, gait and function who will benefit from acute therapy to maximize mobility and safety to decrease burden of care.          Recommendations for follow up therapy are one component of a multi-disciplinary discharge planning process, led by the attending physician.  Recommendations may be updated based on patient status, additional functional criteria and insurance authorization.  Follow Up Recommendations Skilled nursing-short term rehab (<3 hours/day) Can patient physically be transported by private vehicle: Yes    Assistance Recommended at Discharge Intermittent Supervision/Assistance  Patient can return home with the following  A little help with walking and/or transfers;A little help with bathing/dressing/bathroom;Direct supervision/assist for financial management;Direct supervision/assist for medications management;Assist for transportation;Help with stairs or ramp for entrance;Assistance with cooking/housework    Equipment Recommendations None recommended by PT  Recommendations for Other Services       Functional Status Assessment Patient has had a recent decline in their functional status and/or demonstrates limited ability to make significant improvements in function in a reasonable and predictable amount of time     Precautions / Restrictions Precautions Precautions: Fall      Mobility  Bed Mobility Overal bed mobility:  Modified Independent                  Transfers Overall transfer level: Needs assistance   Transfers: Sit to/from Stand Sit to Stand: Supervision           General transfer comment: supervision for safety and lines    Ambulation/Gait Ambulation/Gait assistance: Min assist Gait Distance (Feet): 150 Feet Assistive device: IV Pole Gait Pattern/deviations: Step-through pattern, Decreased stride length   Gait velocity interpretation: <1.8 ft/sec, indicate of risk for recurrent falls   General Gait Details: bil UE support on IV pole with maintained right veering with physical assist for balance and direction  Stairs            Wheelchair Mobility    Modified Rankin (Stroke Patients Only)       Balance Overall balance assessment: Needs assistance   Sitting balance-Leahy Scale: Good Sitting balance - Comments: sitting EOB and toilet without support     Standing balance-Leahy Scale: Fair Standing balance comment: pt able to static stand briefly but reaches for environmental support with any weight shifting                             Pertinent Vitals/Pain Pain Assessment Pain Assessment: 0-10 Pain Score: 3  Pain Location: left hip Pain Descriptors / Indicators: Aching Pain Intervention(s): Limited activity within patient's tolerance, Repositioned, Monitored during session    Home Living Family/patient expects to be discharged to:: Private residence Living Arrangements: Other (Comment)   Type of Home: House Home Access: Stairs to enter   [employer] of Steps: 2   Home Layout: One level Home Equipment: Conservation officer, nature (2 wheels);Cane - single point;Grab bars - tub/shower Additional Comments: pt  states he was renting a room from "[name]" but that he has become "too much for her to handle" and doesn't know where he can go at D/C.    Prior Function Prior Level of Function : Independent/Modified Independent              Mobility Comments: pt reports ambulating without an assistive device at baseline. Pt able to state a fall but unsure of when or circumstances ADLs Comments: per pt independent but per chart had not bathed in weeks     Hand Dominance        Extremity/Trunk Assessment   Upper Extremity Assessment Upper Extremity Assessment: Overall WFL for tasks assessed    Lower Extremity Assessment Lower Extremity Assessment: Generalized weakness    Cervical / Trunk Assessment Cervical / Trunk Assessment: Normal  Communication   Communication: No difficulties  Cognition Arousal/Alertness: Awake/alert Behavior During Therapy: WFL for tasks assessed/performed Overall Cognitive Status: Impaired/Different from baseline Area of Impairment: Orientation, Memory, Problem solving, Following commands                 Orientation Level: Disoriented to, Time   Memory: Decreased short-term memory Following Commands: Follows one step commands consistently     Problem Solving: Slow processing General Comments: pt not oriented to place, time or situation. slow processing with pt unable to provide consistent and accurate information of PLOF or assist available at D/C        General Comments      Exercises     Assessment/Plan    PT Assessment Patient needs continued PT services  PT Problem List Decreased strength;Decreased mobility;Decreased safety awareness;Decreased activity tolerance;Decreased cognition;Decreased balance;Decreased knowledge of use of DME       PT Treatment Interventions Gait training;Therapeutic exercise;Patient/family education;Functional mobility training;Balance training;Stair training;DME instruction;Therapeutic activities;Cognitive remediation    PT Goals (Current goals can be found in the Care Plan section)  Acute Rehab PT Goals Patient Stated Goal: be able to walk and go back to [facility] PT Goal Formulation: With patient Time For Goal Achievement:  02/02/22 Potential to Achieve Goals: Fair    Frequency Min 3X/week     Co-evaluation               AM-PAC PT "6 Clicks" Mobility  Outcome Measure Help needed turning from your back to your side while in a flat bed without using bedrails?: None Help needed moving from lying on your back to sitting on the side of a flat bed without using bedrails?: None Help needed moving to and from a bed to a chair (including a wheelchair)?: A Little Help needed standing up from a chair using your arms (e.g., wheelchair or bedside chair)?: A Little Help needed to walk in hospital room?: A Little Help needed climbing 3-5 steps with a railing? : A Lot 6 Click Score: 19    End of Session Equipment Utilized During Treatment: Gait belt Activity Tolerance: Patient tolerated treatment well Patient left: in chair;with call bell/phone within reach;with chair alarm set Nurse Communication: Mobility status PT Visit Diagnosis: Other abnormalities of gait and mobility (R26.89);History of falling (Z91.81);Muscle weakness (generalized) (M62.81)    Time: 7062-3762 PT Time Calculation (min) (ACUTE ONLY): 24 min   Charges:   PT Evaluation $PT Eval Moderate Complexity: 1 Mod PT Treatments $Gait Training: 8-22 mins        Bayard Males, PT Acute Rehabilitation Services Office: [place] 01/19/2022, 1:03 PM

## 2022-01-19 NOTE — Progress Notes (Signed)
Internal Medicine Attending:   I have seen and evaluated this patient and I have discussed the plan of care with the house staff. I reviewed the resident's note and I agree with the resident's findings and plan as documented in the resident's note. Please see their note for complete details.   Charise Killian, MD 01/19/2022, 4:15 PM

## 2022-01-19 NOTE — Progress Notes (Signed)
HD#2 Subjective:   Summary: Mark Cabrera is a 71 y.o. person living with a history of  DM, HLD, heart failure, CAD status post CABG, AFib, sleep apnea, compensated cirrhosis, and multiple CVAs  who presented with hyperglycemia.  Overnight Events: Patient w afib w RVR. Received IV metoprolol and rate control achieved. Ordered metoprolol succinate 25 mg daily.   Objective:  Vital signs in last 24 hours: Vitals:   01/19/22 0308 01/19/22 0750 01/19/22 1141 01/19/22 1200  BP: 110/63 (!) 110/57 121/77   Pulse: 81 83 79 78  Resp: '19 17 15   '$ Temp:  98 F (36.7 C) (!) 97.5 F (36.4 C)   TempSrc:  Oral Oral   SpO2: 97% 97% 99% 98%  Weight:      Height:       Supplemental O2: Room Air SpO2: 98 % O2 Flow Rate (L/min): 2 L/min   Physical Exam:  Constitutional: well-appearing elderly male sitting in hospital bed, in no acute distress HENT: normocephalic atraumatic, mucous membranes moist Eyes: conjunctiva non-erythematous Pulmonary/Chest: normal work of breathing on room air GU: glans penis without obvious erythema or skin breakdown though exam limited due to condom cath Psych: More pleasant today  Filed Weights   01/17/22 1731  Weight: 77.6 kg     Intake/Output Summary (Last 24 hours) at 01/19/2022 1341 Last data filed at 01/19/2022 1211 Gross per 24 hour  Intake 550.55 ml  Output 950 ml  Net -399.45 ml   Net IO Since Admission: -620.23 mL [01/19/22 1341]  Pertinent Labs:    Latest Ref Rng & Units 01/19/2022    8:24 AM 01/19/2022    2:17 AM 01/17/2022    7:55 PM  CBC  WBC 4.0 - 10.5 K/uL 2.7  2.6    Hemoglobin 13.0 - 17.0 g/dL 9.6  8.9  11.2    11.2   Hematocrit 39.0 - 52.0 % 27.9  26.3  33.0    33.0   Platelets 150 - 400 K/uL 169  165         Latest Ref Rng & Units 01/19/2022    2:17 AM 01/18/2022    7:16 PM 01/18/2022    5:20 AM  CMP  Glucose 70 - 99 mg/dL 218  257  256   BUN 8 - 23 mg/dL '10  12  13   '$ Creatinine 0.61 - 1.24 mg/dL 0.95  0.89  0.84    Sodium 135 - 145 mmol/L 134  132  134   Potassium 3.5 - 5.1 mmol/L 3.0  3.4  3.5   Chloride 98 - 111 mmol/L 104  100  102   CO2 22 - 32 mmol/L '22  22  22   '$ Calcium 8.9 - 10.3 mg/dL 8.2  8.6  8.8    Magnesium 1.6  Imaging: No results found.  Assessment/Plan:   Principal Problem:   Hyperglycemia   Patient Summary: Mark Cabrera is a 71 y.o. person living with a history of  DM, HLD, heart failure, CAD status post CABG, AFib, sleep apnea, compensated cirrhosis, and multiple CVAs who presented with hyperglycemia and admitted for glucose management.   #Severe Hyperglycemia A1c >14% this admission. Fasting 237 today. Outpatient takes Humalog 18 units tid, metformin bid. Will adjust dosing tomorrow once more mealtime glucose data available. - Novolog 10 u with meals + SSI - Semglee 30 u qhs - daily BMP   #Diarrhea  Patient has not had any episodes of diarrhea since admission. No other signs  of infection. Patient was not taking lactulose before admission. Given resolution of symptoms, will discontinue GI panel order and enteric precautions.  - continue to monitor for return of symptoms   #Hyponatremia 2/2 Hyperglycemia  Resolved with treatment of hyperglycemia. -Daily BMP  #Hypokalemia #Hypomagnesemia Potassium 3.0. Magnesium 1.6 -Potassium chloride 40 meq po -Magnesium 4 g IV   #Penile Lesions Patient states symptoms much improved (had been peeling skin, lots of redness and discomfort). Exam limited due to condom catheter but not erythema, skin intact. Completed one time dose diflucan and will continue topical Lotrimin.  If no response, will consider psoriasis as possible etiology. - Diflucan 105 mg once - topical lotrimin for 2 weeks   #HFrEF EF 50-55% NYHA I  Pt follows with [facility], and last cardiology appointment was 04/29/20. Echo in 04/11/19 showed a EF of 50-55%, LV had low normal function, and left ventricular internal cavity size. Unsure as to why pt is not on  GDMT management, though it appears he was intermittently hypotensive and orthostatic last admission. Patient without significant hypotension this admission.  -Will consider addition of ACE inhibitor if remains stable with metoprolol for afib   #Hepatic Cirrhosis  #Chronic Leukopenia Was initially noted in 2016, likely in the setting of hepatic cirrhosis. WBC count is currently 2.7. Pt has not been febrile since emergency department. Blood smear on last admission showed pancytopenia without blasts, and occasional reactive plasmacytoid lymphs but no hairy cells. Hepatitis serologies were checked as well which were non-reactive. Unclear etiology of cirrhosis, last ultrasound was done in 2021 with findings consisted with compensated cirrhosis. AST/ALT within normal limits currently. Per chart review, was reportedly following with [facility], however most appointments have been missed and pt declined to reschedule.  - continue to monitor for signs of decompensated cirrhosis   #Paroxysmal Atrial Fibrillation  Pt has history of A-Fib, and is prescribed Eliquis. CHADVASC score is 7, correlating with a 15.7% risk of stroke in a year. Afib with RVR yesterday evening. Metoprolol 5 mg IV given with rate control achieved. -Metoprolol succinate 25 mg daily - Continue cardiac monitoring - Eliquis '5mg'$    #Normocytic Anemia Asymptomatic, HDS. Pt has Hb of 11.2 yesterday, 8.9 today. Likely anemia of chronic disease. Will continue to monitor.   - Daily CBC   #Code Status  #Dispo Daughter confirms DNR status. Will reach out to [facility] for likely SNF placement. -PT recommends SNF   Diet: Carb modified VTE: Eliquis IVF: LR,125cc/hr Code: DNR   Prior to Admission Living Arrangement: Home, living   Anticipated Discharge Location: SNF Barriers to Discharge: Medical Management   Dispo: Admit patient to Inpatient with expected length of stay greater than 2 midnights.   Linward Natal MD Internal  Medicine Resident PGY-1 Please contact the on call pager after 5 pm and on weekends at [phone].

## 2022-01-19 NOTE — Progress Notes (Signed)
Mobility Specialist: Progress Note   01/19/22 1456  Mobility  Activity Ambulated with assistance in hallway  Level of Assistance Minimal assist, patient does 75% or more  Assistive Device Other (Comment) (IV pole)  Distance Ambulated (ft) 270 ft  Activity Response Tolerated well  Mobility Referral Yes  $Mobility charge 1 Mobility   Pre-Mobility: 70 HR, 98% SpO2 Post-Mobility:71  HR, 110/70 (81) BP, 99% SpO2  Pt received in the chair and agreeable to mobility. Mod I to stand and minA during ambulation for balance and direction as pt has tendency to drift to the right. Pt back to the chair after session with call bell and phone in reach. Chair alarm is on.   Corwin Trayvon Trumbull Mobility Specialist Please contact via SecureChat or Rehab office at [phone]

## 2022-01-20 ENCOUNTER — Encounter: Payer: Medicare Other | Admitting: Student

## 2022-01-20 DIAGNOSIS — R739 Hyperglycemia, unspecified: Secondary | ICD-10-CM | POA: Diagnosis not present

## 2022-01-20 LAB — CBC WITH DIFFERENTIAL/PLATELET
Abs Immature Granulocytes: 0.05 10*3/uL (ref 0.00–0.07)
Basophils Absolute: 0 10*3/uL (ref 0.0–0.1)
Basophils Relative: 0 %
Eosinophils Absolute: 0 10*3/uL (ref 0.0–0.5)
Eosinophils Relative: 2 %
HCT: 25.2 % — ABNORMAL LOW (ref 39.0–52.0)
Hemoglobin: 8.9 g/dL — ABNORMAL LOW (ref 13.0–17.0)
Immature Granulocytes: 2 %
Lymphocytes Relative: 24 %
Lymphs Abs: 0.6 10*3/uL — ABNORMAL LOW (ref 0.7–4.0)
MCH: 31.6 pg (ref 26.0–34.0)
MCHC: 35.3 g/dL (ref 30.0–36.0)
MCV: 89.4 fL (ref 80.0–100.0)
Monocytes Absolute: 0.2 10*3/uL (ref 0.1–1.0)
Monocytes Relative: 8 %
Neutro Abs: 1.7 10*3/uL (ref 1.7–7.7)
Neutrophils Relative %: 64 %
Platelets: 171 10*3/uL (ref 150–400)
RBC: 2.82 MIL/uL — ABNORMAL LOW (ref 4.22–5.81)
RDW: 14.6 % (ref 11.5–15.5)
WBC: 2.7 10*3/uL — ABNORMAL LOW (ref 4.0–10.5)
nRBC: 0 % (ref 0.0–0.2)

## 2022-01-20 LAB — BASIC METABOLIC PANEL
Anion gap: 10 (ref 5–15)
BUN: 12 mg/dL (ref 8–23)
CO2: 20 mmol/L — ABNORMAL LOW (ref 22–32)
Calcium: 8.2 mg/dL — ABNORMAL LOW (ref 8.9–10.3)
Chloride: 100 mmol/L (ref 98–111)
Creatinine, Ser: 0.9 mg/dL (ref 0.61–1.24)
GFR, Estimated: >60 mL/min (ref >60)
Glucose, Bld: 183 mg/dL — ABNORMAL HIGH (ref 70–99)
Potassium: 3.9 mmol/L (ref 3.5–5.1)
Sodium: 130 mmol/L — ABNORMAL LOW (ref 135–145)

## 2022-01-20 LAB — SODIUM, URINE, RANDOM: Sodium, Ur: <10 mmol/L

## 2022-01-20 LAB — GLUCOSE, CAPILLARY
Glucose-Capillary: 175 mg/dL — ABNORMAL HIGH (ref 70–99)
Glucose-Capillary: 281 mg/dL — ABNORMAL HIGH (ref 70–99)
Glucose-Capillary: 307 mg/dL — ABNORMAL HIGH (ref 70–99)
Glucose-Capillary: 260 mg/dL — ABNORMAL HIGH (ref 70–99)
Glucose-Capillary: 282 mg/dL — ABNORMAL HIGH (ref 70–99)

## 2022-01-20 LAB — OSMOLALITY: Osmolality: 285 mOsm/kg (ref 275–295)

## 2022-01-20 NOTE — TOC Progression Note (Signed)
Transition of Care [facility]) - Progression Note    Patient Details  Name: Mark Cabrera MRN: [id number] Date of Birth: 07-Apr-1950  Transition of Care [facility]) CM/SW [place], [place] Phone Number: 01/20/2022, 2:19 PM  Clinical Narrative:     CSW contacted pt's [name]. She provides CSW with email address; CSW emailed SNF bed offers with medicare star ratings.    Expected Discharge Plan: [place] Barriers to Discharge: Continued Medical Work up  Expected Discharge Plan and Services Expected Discharge Plan: [place] In-house Referral: Clinical Social Work     Living arrangements for the past 2 months: Single Family Home                                       Social Determinants of Health (SDOH) Interventions    Readmission Risk Interventions     No data to display

## 2022-01-20 NOTE — Progress Notes (Signed)
Mobility Specialist Progress Note   01/20/22 1601  Mobility  Activity Ambulated with assistance in hallway  Level of Assistance Minimal assist, patient does 75% or more  Assistive Device Front wheel walker  Distance Ambulated (ft) 270 ft  Activity Response Tolerated well  $Mobility charge 1 Mobility   Pre Mobility: 71 HR, 115/72 BP, 100% SpO2 During Mobility: 92 HR, 89% SpO2 Post Mobility: 74 HR, 124/84 BP, 93% SpO2  Pt originally deferring mobility but changed mind after mod encouragement. Able to stand at EOB w/ minG but requiring MinA for ambulation d/t pt's tendency to drift to the right. x1 seated rest break d/t increased dizziness, feeling subsiding shortly and pt able to make it back to EOB w/o fault. Left EOB w/ call bell in reach and dinner tray in front.  Holland Falling Mobility Specialist Acute Rehab Office:  [phone]

## 2022-01-20 NOTE — Inpatient Diabetes Management (Signed)
Inpatient Diabetes Program Recommendations  AACE/ADA: New Consensus Statement on Inpatient Glycemic Control (2015)  Target Ranges:  Prepandial:   less than 140 mg/dL      Peak postprandial:   less than 180 mg/dL (1-2 hours)      Critically ill patients:  140 - 180 mg/dL   Lab Results  Component Value Date   GLUCAP 281 (H) 01/20/2022   HGBA1C >14.0 01/17/2022    Review of Glycemic Control  Latest Reference Range & Units 01/19/22 08:45 01/19/22 11:39 01/19/22 15:03 01/19/22 21:22 01/20/22 07:52 01/20/22 11:39  Glucose-Capillary 70 - 99 mg/dL 237 (H) 381 (H) 257 (H) 120 (H) 175 (H) 281 (H)   Diabetes history: DM 2 Outpatient Diabetes medications: Humalog 18 units tid, metformin bid Current orders for Inpatient glycemic control:  Semglee 40 units Daily Novolog 0-6 units tid Novolog 12 units tid meal coverage  A1c  >14% this admission  Fasting glucose 175. Glucose trends increased to 281 at lunch.  -   Increase Novolog meal coverage to 15 units tid if eating >50% of meals  Thanks,  Tama Headings RN, MSN, BC-ADM Inpatient Diabetes Coordinator Team Pager [phone] (8a-5p)

## 2022-01-20 NOTE — Progress Notes (Addendum)
HD#3 Subjective:   Summary: Mark Cabrera is a 71 y.o. person living with a history of  DM, HLD, heart failure, CAD status post CABG, AFib, sleep apnea, compensated cirrhosis, and multiple CVAs  who presented with hyperglycemia.  Overnight Events: NAEO.   Objective:  Vital signs in last 24 hours: Vitals:   01/19/22 1505 01/19/22 1946 01/19/22 2312 01/20/22 0305  BP: 107/87 102/68 122/78 125/74  Pulse: 70 81 81 75  Resp: '17 18 19 16  '$ Temp: 98 F (36.7 C) 98.3 F (36.8 C) 98 F (36.7 C)   TempSrc: Oral Oral Oral Oral  SpO2: 98% 98% 99% 97%  Weight:      Height:       Supplemental O2: Room Air SpO2: 97 % O2 Flow Rate (L/min): 2 L/min   Physical Exam:  Constitutional: well-appearing elderly male sitting in hospital bed, in no acute distress HENT: normocephalic atraumatic, mucous membranes moist Eyes: conjunctiva non-erythematous Pulmonary/Chest: normal work of breathing on room air, lungs CTAB Psych: Depressed, irritable  Filed Weights   01/17/22 1731  Weight: 77.6 kg     Intake/Output Summary (Last 24 hours) at 01/20/2022 0701 Last data filed at 01/19/2022 2313 Gross per 24 hour  Intake 500 ml  Output 1050 ml  Net -550 ml   Net IO Since Admission: -1,020.23 mL [01/20/22 0701]  Pertinent Labs:    Latest Ref Rng & Units 01/20/2022    4:43 AM 01/19/2022    8:24 AM 01/19/2022    2:17 AM  CBC  WBC 4.0 - 10.5 K/uL 2.7  2.7  2.6   Hemoglobin 13.0 - 17.0 g/dL 8.9  9.6  8.9   Hematocrit 39.0 - 52.0 % 25.2  27.9  26.3   Platelets 150 - 400 K/uL 171  169  165        Latest Ref Rng & Units 01/20/2022    4:43 AM 01/19/2022    2:17 AM 01/18/2022    7:16 PM  CMP  Glucose 70 - 99 mg/dL 183  218  257   BUN 8 - 23 mg/dL '12  10  12   '$ Creatinine 0.61 - 1.24 mg/dL 0.90  0.95  0.89   Sodium 135 - 145 mmol/L 130  134  132   Potassium 3.5 - 5.1 mmol/L 3.9  3.0  3.4   Chloride 98 - 111 mmol/L 100  104  100   CO2 22 - 32 mmol/L '20  22  22   '$ Calcium 8.9 - 10.3 mg/dL  8.2  8.2  8.6    Magnesium 1.6  Imaging: No results found.  Assessment/Plan:   Principal Problem:   Hyperglycemia   Patient Summary: [name] is a 71 y.o. person living with a history of  DM, HLD, heart failure, CAD status post CABG, AFib, sleep apnea, compensated cirrhosis, and multiple CVAs who presented with hyperglycemia and admitted for glucose management.   #Severe Hyperglycemia A1c >14% this admission. Increased semglee to 40 qhs. Fasting 183 today. Outpatient takes Humalog 18 units tid, metformin bid. - Novolog 12 u with meals + SSI - Semglee 40 u qhs - daily BMP   #Diarrhea  Resolved. - continue to monitor for return of symptoms   #Hyponatremia 2/2 Hyperglycemia  Euvolemic. Possibly SIADH. Will order urine sodium and serum osmolality. -Daily BMP  #Hypokalemia #Hypomagnesemia Potassium 3.9. -supplement electrolytes as needed  #Penile Lesions Patient states symptoms much improved (had been peeling skin, lots of redness and discomfort). Exam limited  due to condom catheter but not erythema, skin intact. Completed one time dose diflucan and will continue topical Lotrimin.  If no response, will consider psoriasis as possible etiology. - Diflucan 150 mg once - topical lotrimin for 2 weeks   #HFrEF EF 50-55% NYHA I  Echo in 04/11/19 showed a EF of 50-55%. Subjective dyspnea but non-concerning vitals / exam. Placed [place] but weaned off and patient comfortable. -Normotensive. Will hold off on additional medications for now as he had some orthostasis last admission   #Hepatic Cirrhosis  #Chronic Leukopenia Was initially noted in 2016, likely in the setting of hepatic cirrhosis. WBC count is currently 2.7. Pt has not been febrile since emergency department. Blood smear on last admission showed pancytopenia without blasts, and occasional reactive plasmacytoid lymphs but no hairy cells. Hepatitis serologies were checked as well which were non-reactive. Unclear etiology of  cirrhosis, last ultrasound was done in 2021 with findings consisted with compensated cirrhosis. AST/ALT within normal limits currently. Per chart review, was reportedly following with [facility] hepatology, however most appointments have been missed and pt declined to reschedule.  - continue to monitor for signs of decompensated cirrhosis   #Paroxysmal Atrial Fibrillation  Pt has history of A-Fib, and is prescribed Eliquis. CHADVASC score is 7, correlating with a 15.7% risk of stroke in a year. Afib with RVR 11/8. Metoprolol 5 mg IV given with rate control achieved.  -Metoprolol succinate 25 mg daily - Continue cardiac monitoring - Eliquis '5mg'$    #Normocytic Anemia Asymptomatic, HDS. Pt has Hb of 8.9 today. Likely anemia of chronic disease. Will continue to monitor.   - Daily CBC   #Code Status  #Dispo Daughter confirms DNR status. Plans for SNF then possible memory care thereafter. -PT recommends SNF -TOC following   Diet: Carb modified VTE: Eliquis IVF: none Code: DNR   Prior to Admission Living Arrangement: Home, living   Anticipated Discharge Location: SNF Barriers to Discharge: Medical Management   Dispo: Admit patient to Inpatient with expected length of stay greater than 2 midnights.   Linward Natal MD Internal Medicine Resident PGY-1 Please contact the on call pager after 5 pm and on weekends at [phone].

## 2022-01-20 NOTE — Progress Notes (Signed)
Patient discharging to SNF

## 2022-01-21 DIAGNOSIS — R739 Hyperglycemia, unspecified: Secondary | ICD-10-CM | POA: Diagnosis not present

## 2022-01-21 LAB — GLUCOSE, CAPILLARY
Glucose-Capillary: 204 mg/dL — ABNORMAL HIGH (ref 70–99)
Glucose-Capillary: 299 mg/dL — ABNORMAL HIGH (ref 70–99)
Glucose-Capillary: 304 mg/dL — ABNORMAL HIGH (ref 70–99)
Glucose-Capillary: 230 mg/dL — ABNORMAL HIGH (ref 70–99)

## 2022-01-21 LAB — CBC WITH DIFFERENTIAL/PLATELET
Abs Immature Granulocytes: 0.03 10*3/uL (ref 0.00–0.07)
Basophils Absolute: 0 10*3/uL (ref 0.0–0.1)
Basophils Relative: 1 %
Eosinophils Absolute: 0 10*3/uL (ref 0.0–0.5)
Eosinophils Relative: 2 %
HCT: 28 % — ABNORMAL LOW (ref 39.0–52.0)
Hemoglobin: 9.4 g/dL — ABNORMAL LOW (ref 13.0–17.0)
Immature Granulocytes: 1 %
Lymphocytes Relative: 32 %
Lymphs Abs: 0.7 10*3/uL (ref 0.7–4.0)
MCH: 31 pg (ref 26.0–34.0)
MCHC: 33.6 g/dL (ref 30.0–36.0)
MCV: 92.4 fL (ref 80.0–100.0)
Monocytes Absolute: 0.1 10*3/uL (ref 0.1–1.0)
Monocytes Relative: 5 %
Neutro Abs: 1.3 10*3/uL — ABNORMAL LOW (ref 1.7–7.7)
Neutrophils Relative %: 59 %
Platelets: 189 10*3/uL (ref 150–400)
RBC: 3.03 MIL/uL — ABNORMAL LOW (ref 4.22–5.81)
RDW: 14.6 % (ref 11.5–15.5)
WBC: 2.2 10*3/uL — ABNORMAL LOW (ref 4.0–10.5)
nRBC: 0 % (ref 0.0–0.2)

## 2022-01-21 LAB — BASIC METABOLIC PANEL
Anion gap: 9 (ref 5–15)
BUN: 13 mg/dL (ref 8–23)
CO2: 21 mmol/L — ABNORMAL LOW (ref 22–32)
Calcium: 8.4 mg/dL — ABNORMAL LOW (ref 8.9–10.3)
Chloride: 101 mmol/L (ref 98–111)
Creatinine, Ser: 0.95 mg/dL (ref 0.61–1.24)
GFR, Estimated: >60 mL/min (ref >60)
Glucose, Bld: 227 mg/dL — ABNORMAL HIGH (ref 70–99)
Potassium: 3.8 mmol/L (ref 3.5–5.1)
Sodium: 131 mmol/L — ABNORMAL LOW (ref 135–145)

## 2022-01-21 MED ORDER — INFLUENZA VAC A&B SA ADJ QUAD 0.5 ML IM PRSY
0.5000 mL | PREFILLED_SYRINGE | INTRAMUSCULAR | Status: DC
  Filled 2022-01-21: qty 0.5

## 2022-01-21 MED ORDER — ENSURE ENLIVE PO LIQD: 1.0000 | Freq: Two times a day (BID) | ORAL | Status: DC

## 2022-01-21 MED ORDER — GLUCERNA SHAKE PO LIQD: 237.0000 mL | Freq: Three times a day (TID) | ORAL | Status: DC

## 2022-01-21 MED ORDER — GLUCERNA SHAKE PO LIQD
237.0000 mL | Freq: Three times a day (TID) | ORAL | Status: DC
  Administered 2022-01-21 – 2022-01-22 (×3): 237 mL via ORAL

## 2022-01-21 MED ORDER — INSULIN GLARGINE-YFGN 100 UNIT/ML ~~LOC~~ SOLN
44.0000 [IU] | Freq: Every day | SUBCUTANEOUS | Status: DC
  Administered 2022-01-21 – 2022-01-24 (×4): 44 [IU] via SUBCUTANEOUS
  Filled 2022-01-21 (×5): qty 0.44

## 2022-01-21 NOTE — Progress Notes (Signed)
Patient called nurse stating that he feels like he cannot breathe. Oxygen saturation 100%, RR 17, BP WNL, HR WNL, and lungs clear to auscultation. Patient on 2L PRN per day nurse. Though patient complains of "having trouble breathing", he continues to pull oxygen out of his nose. Patient yelling and sticking up middle finger to nurse. Oxygen increased to 4L because patient states he needs to feel the air and hear it. Advised patient his oxygen being high would dry his nares out. When nurse turned around, patient again had pulled oxygen out of his nares. Oxygen replaced, education provided, and VS remain WNL and stable. Will continue to educate and monitor.

## 2022-01-21 NOTE — Progress Notes (Signed)
HD#4 SUBJECTIVE:  Patient Summary: Mark Cabrera is a 71 y.o. with a pertinent PMH of DM, HLD, heart failure, CAD status post CABG, AFib, sleep apnea, compensated cirrhosis, and multiple CVAs, who presented with severe symptomatic hyperglycemia.  Overnight Events: None  Interim History: Mark Cabrera says that he has blurry vision this morning and that is a reason that he feels like he is dying, among others expressed throughout this admission so far. He really wants to have something chocolate flavored and is excited at the prospect of chocolate Ensures poured over ice. He says that something so simple would make him feel so much better.  OBJECTIVE:  Vital Signs: Vitals:   01/20/22 2302 01/21/22 0305 01/21/22 0823 01/21/22 1209  BP: 119/74 109/67 127/66 123/74  Pulse: 77 77 79 77  Resp: '16 18 18 16  '$ Temp: 98.1 F (36.7 C) 98.4 F (36.9 C) 97.6 F (36.4 C) 98.4 F (36.9 C)  TempSrc: Axillary Oral Oral Oral  SpO2: 100% 100% 98% 100%  Weight:      Height:       Supplemental O2: Room Air SpO2: 100 % O2 Flow Rate (L/min): 2 L/min  Filed Weights   01/17/22 1731  Weight: 77.6 kg     Intake/Output Summary (Last 24 hours) at 01/21/2022 1354 Last data filed at 01/21/2022 1209 Gross per 24 hour  Intake --  Output 300 ml  Net -300 ml   Net IO Since Admission: -1,040.23 mL [01/21/22 1354]  Physical Exam: Constitutional:Resting comfortably. In no acute distress. Cardio:Regular rate and rhythm. No murmurs, rubs, or gallops. Pulm:Clear to auscultation bilaterally. Normal work of breathing on room air. KYH:CWCBJSEG for extremity edema. Skin:Warm and dry. Neuro:Alert and oriented x3. No focal deficit noted. Pulls self from laying to seating and shifts to sitting on side of the bed to eat his breakfast with ease and without assistance. Psych:Pleasant mood and affect.  Patient Lines/Drains/Airways Status     Active Line/Drains/Airways     Name Placement date Placement time Site  Days   Peripheral IV 01/17/22 20 G Left;Posterior Hand 01/17/22  2306  Hand  4   Peripheral IV 01/18/22 20 G Posterior;Right Hand 01/18/22  1900  Hand  3   External Urinary Catheter 01/18/22  2000  --  3   Incision (Closed) 03/30/16 Sternum Anterior;Mid 03/30/16  1011  -- 2123   Incision (Closed) 03/30/16 Leg Right 03/30/16  1011  -- 2123   Incision (Closed) 04/21/16 Chest Other (Comment) 04/21/16  1715  -- 2101   Incision (Closed) 04/21/16 Chest Right 04/21/16  1715  -- 2101   Incision (Closed) 08/13/17 Chest Other (Comment) 08/13/17  1536  -- 1622             ASSESSMENT/PLAN:  Assessment: Principal Problem:   Hyperglycemia   Plan: #Severe Hyperglycemia Fasting CBG reman elevated >200. Current regimen includes semglee 40 units daily in addition to novolog 12 units TID with meals plus SSI. -Increase Semglee to 44 units daily; continue novolog 12 units TID with meals plus SSI -Follow CBGs   #Hyponatremia 2/2 Hyperglycemia  Corrected sodium for hyperglycemia is 133. Stable. Urine sodium and serum osmolality unremarkable. -Trend BMP q2days  #Paroxysmal Atrial Fibrillation, CHAD-VASc score of 7 Rate controlled, regularly regular rhythm on exam today. On metoprolol 25 mg PO daily. -Metoprolol succinate 25 mg daily -Cardiac monitoring -Eliquis 5 mg BID   #Penile Lesions S/p 1x dose of diflucan 150 mg. No complaints regarding the skin of his penis today. -  Topical lotrimin for 2 weeks   #HFrEF, LV EF 50-55%, NYHA I  Euvolemic on exam, no concern for acute exacerbation. On room air.  -Monitor volume status   #Hepatic Cirrhosis  #Chronic Leukopenia WBC stable, 2.2. No signs of bleeding or infection. No concern for hepatic encephalopathy or decompensated cirrhosis at this time.    #Normocytic Anemia Hgb stable. Asymptomatic, HDS. Likely anemia of chronic disease. No need for daily CBC.   Best Practice: Diet: Cardiac diet and Diabetic diet IVF: None VTE: SCDs Code:  DNR AB: None Therapy Recs: SNF Family Contact: Mary, daughter, called and notified. DISPO: Anticipated discharge to Skilled nursing facility pending Insurance for SNF coverage.  Signature: Farrel Gordon, D.O.  Internal Medicine Resident, PGY-2 [facility]  Pager: [phone]   Please contact the on call pager after 5 pm and on weekends at [phone].

## 2022-01-21 NOTE — Evaluation (Signed)
Occupational Therapy Evaluation Patient Details Name: Mark Cabrera MRN: [id number] DOB: Aug 27, 1950 Today's Date: 01/21/2022   History of Present Illness 71 yo male admitted 11/7 with hyperglycemia, fall, irritability, diarrhea. PMHx: CVA, depression, CAD, CABG, AVR, HLD, DM, HTN   Clinical Impression   Patient admitted for the diagnosis above.  PTA he was renting a room, waked without an AD, and states he could care for his own ADL.  Currently he is needing up to [place] for ADL from a sit to stand level, and Min A for generalized in room mobility.  Patient self limiting out of bed and mobility in the room, stating he is "on the downward slide, and isn't going to make it."  OT can continue in the acute setting, and SNF is recommended for post acute rehab, and assist for determining final discharge disposition.       Recommendations for follow up therapy are one component of a multi-disciplinary discharge planning process, led by the attending physician.  Recommendations may be updated based on patient status, additional functional criteria and insurance authorization.   Follow Up Recommendations  Skilled nursing-short term rehab (<3 hours/day)    Assistance Recommended at Discharge Frequent or constant Supervision/Assistance  Patient can return home with the following Assist for transportation;A little help with bathing/dressing/bathroom;A little help with walking and/or transfers    Functional Status Assessment  Patient has had a recent decline in their functional status and demonstrates the ability to make significant improvements in function in a reasonable and predictable amount of time.  Equipment Recommendations  None recommended by OT    Recommendations for Other Services       Precautions / Restrictions Precautions Precautions: Fall Restrictions Weight Bearing Restrictions: No      Mobility Bed Mobility Overal bed mobility: Needs Assistance Bed Mobility: Supine to Sit,  Sit to Supine     Supine to sit: Modified independent (Device/Increase time) Sit to supine: Modified independent (Device/Increase time)        Transfers Overall transfer level: Needs assistance   Transfers: Sit to/from Stand Sit to Stand: Min assist                  Balance Overall balance assessment: Needs assistance Sitting-balance support: Feet supported Sitting balance-Leahy Scale: Good     Standing balance support: Reliant on assistive device for balance Standing balance-Leahy Scale: Poor                             ADL either performed or assessed with clinical judgement   ADL Overall ADL's : Needs assistance/impaired Eating/Feeding: Set up;Sitting   Grooming: Wash/dry hands;Wash/dry face;Set up;Sitting           Upper Body Dressing : Sitting;Set up   Lower Body Dressing: Minimal assistance;Sit to/from stand   Toilet Transfer: Minimal assistance;Rolling walker (2 wheels);Ambulation;Regular Toilet                   Vision Patient Visual Report: No change from baseline       Perception Perception Perception: Not tested   Praxis Praxis Praxis: Not tested    Pertinent Vitals/Pain Pain Assessment Pain Assessment: Faces Faces Pain Scale: No hurt Pain Intervention(s): Monitored during session     Hand Dominance Right   Extremity/Trunk Assessment Upper Extremity Assessment Upper Extremity Assessment: Generalized weakness   Lower Extremity Assessment Lower Extremity Assessment: Defer to PT evaluation   Cervical / Trunk Assessment Cervical /  Trunk Assessment: Normal   Communication Communication Communication: No difficulties   Cognition Arousal/Alertness: Awake/alert Behavior During Therapy: WFL for tasks assessed/performed Overall Cognitive Status: Impaired/Different from baseline Area of Impairment: Orientation, Memory, Problem solving, Following commands                 Orientation Level: Disoriented to,  Time   Memory: Decreased short-term memory Following Commands: Follows one step commands consistently     Problem Solving: Slow processing       General Comments   VSS on RA    Exercises     Shoulder Instructions      Home Living Family/patient expects to be discharged to:: Private residence   Available Help at Discharge: Other (Comment);Available PRN/intermittently (Roommate) Type of Home: House Home Access: Stairs to enter [employer] of Steps: 2 Entrance Stairs-Rails: Right Home Layout: One level     Bathroom Shower/Tub: Occupational psychologist: Standard Bathroom Accessibility: Yes How Accessible: Accessible via walker Home Equipment: [place] (2 wheels);Cane - single point;Grab bars - tub/shower          Prior Functioning/Environment Prior Level of Function : Independent/Modified Independent                        OT Problem List: Decreased strength;Decreased activity tolerance;Impaired balance (sitting and/or standing);Decreased safety awareness;Decreased cognition;Pain      OT Treatment/Interventions: Self-care/ADL training;Therapeutic activities;Balance training;DME and/or AE instruction;Patient/family education    OT Goals(Current goals can be found in the care plan section) Acute Rehab OT Goals Patient Stated Goal: none stated OT Goal Formulation: With patient Time For Goal Achievement: 02/04/22 Potential to Achieve Goals: Good ADL Goals Pt Will Perform Grooming: with supervision;standing Pt Will Perform Lower Body Dressing: with supervision;sit to/from stand Pt Will Transfer to Toilet: with supervision;ambulating;regular height toilet  OT Frequency: Min 2X/week    Co-evaluation              AM-PAC OT "6 Clicks" Daily Activity     Outcome Measure Help from another person eating meals?: None Help from another person taking care of personal grooming?: None Help from another person toileting, which includes  using toliet, bedpan, or urinal?: A Little Help from another person bathing (including washing, rinsing, drying)?: A Little Help from another person to put on and taking off regular upper body clothing?: A Little Help from another person to put on and taking off regular lower body clothing?: A Little 6 Click Score: 20   End of Session Equipment Utilized During Treatment: Rolling walker (2 wheels) Nurse Communication: Mobility status  Activity Tolerance: Other (comment) (self limits, stating he is going to die soon, so it doesn't really matter) Patient left: in bed;with call bell/phone within reach;with bed alarm set  OT Visit Diagnosis: Unsteadiness on feet (R26.81);Muscle weakness (generalized) (M62.81)                Time: 5993-5701 OT Time Calculation (min): 18 min Charges:  OT General Charges $OT Visit: 1 Visit OT Evaluation $OT Eval Moderate Complexity: 1 Mod  01/21/2022  RP, OTR/L  Acute Rehabilitation Services  Office:  [phone]   Metta Clines 01/21/2022, 11:17 AM

## 2022-01-22 LAB — GLUCOSE, CAPILLARY
Glucose-Capillary: 172 mg/dL — ABNORMAL HIGH (ref 70–99)
Glucose-Capillary: 139 mg/dL — ABNORMAL HIGH (ref 70–99)
Glucose-Capillary: 157 mg/dL — ABNORMAL HIGH (ref 70–99)
Glucose-Capillary: 106 mg/dL — ABNORMAL HIGH (ref 70–99)
Glucose-Capillary: 82 mg/dL (ref 70–99)

## 2022-01-22 LAB — BASIC METABOLIC PANEL
Anion gap: 12 (ref 5–15)
BUN: 13 mg/dL (ref 8–23)
CO2: 21 mmol/L — ABNORMAL LOW (ref 22–32)
Calcium: 8.6 mg/dL — ABNORMAL LOW (ref 8.9–10.3)
Chloride: 101 mmol/L (ref 98–111)
Creatinine, Ser: 0.84 mg/dL (ref 0.61–1.24)
GFR, Estimated: >60 mL/min (ref >60)
Glucose, Bld: 185 mg/dL — ABNORMAL HIGH (ref 70–99)
Potassium: 3.4 mmol/L — ABNORMAL LOW (ref 3.5–5.1)
Sodium: 134 mmol/L — ABNORMAL LOW (ref 135–145)

## 2022-01-22 LAB — MAGNESIUM: Magnesium: 1.7 mg/dL (ref 1.7–2.4)

## 2022-01-22 MED ORDER — ONDANSETRON HCL 4 MG/2ML IJ SOLN
4.0000 mg | Freq: Three times a day (TID) | INTRAMUSCULAR | Status: DC | PRN
  Administered 2022-01-22: 4 mg via INTRAVENOUS
  Filled 2022-01-22: qty 2

## 2022-01-22 MED ORDER — GLUCERNA SHAKE PO LIQD
237.0000 mL | Freq: Four times a day (QID) | ORAL | Status: DC
  Administered 2022-01-22 – 2022-01-25 (×11): 237 mL via ORAL
  Filled 2022-01-22 (×4): qty 237

## 2022-01-22 MED ORDER — POTASSIUM CHLORIDE CRYS ER 20 MEQ PO TBCR
40.0000 meq | EXTENDED_RELEASE_TABLET | Freq: Two times a day (BID) | ORAL | Status: AC
  Administered 2022-01-22 (×2): 40 meq via ORAL
  Filled 2022-01-22 (×2): qty 2

## 2022-01-22 NOTE — Progress Notes (Signed)
Patient called due to nausea and gagging. Emesis bag provided and MD on call paged regarding symptoms as there is not a PRN medication ordered at this time. VS stable and CBG 172. Will continue to monitor.

## 2022-01-22 NOTE — Progress Notes (Signed)
Mobility Specialist: Progress Note   01/22/22 1749  Mobility  Activity Ambulated with assistance in hallway  Level of Assistance Minimal assist, patient does 75% or more  Assistive Device Front wheel walker  Distance Ambulated (ft) 180 ft  Activity Response Tolerated fair  Mobility Referral Yes  $Mobility charge 1 Mobility   Pre-Mobility: 66 HR, 113/75 (87) BP, 100% SpO2 Post-Mobility: 67 HR, 113/90 (96) BP, 97% SpO2  Pt received in the bed and agreeable to mobility. Pt bowel incontinent in the bed and assisted with pericare. After standing pt requesting to use BSC, BM successful. MinA to stand as well as during ambulation for balance and drifting R. Pt back to bed after session with call bell and phone at his side. Bed alarm is on.   [place] Darnette Lampron Mobility Specialist Please contact via SecureChat or Rehab office at [phone]

## 2022-01-22 NOTE — Progress Notes (Signed)
Patient awoke with penile burning and pain. Patient removed the primofit and refused to have nurse replace it stating "it burns and hurts". Urinal provided, gown, top sheet, and blanket changed due to incontinent episode. Patient refused to put on a clean gown. Patient covered and privacy reserved. Patient reminded to use urinal at his bedside and call when he needs the urinal emptied.

## 2022-01-22 NOTE — Progress Notes (Signed)
HD#5 SUBJECTIVE:  Patient Summary: Mark Cabrera is a 71 y.o. with a pertinent PMH of DM, HLD, heart failure, CAD status post CABG, AFib, sleep apnea, compensated cirrhosis, and multiple CVAs, who presented with severe symptomatic hyperglycemia.  Overnight Events: Patient experienced some burning penile pain that improved with removal of primofit. Also with some transient nausea that resolved with one dose Zofran.  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 Has no sense or purpose anymore and says he feels ready to transition to the next place. 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 Seems happy when talking about this. Said he was painting still before he came in--was drawing because painting is too physically demanding. Doesn't think being able to draw here would be helpful. He says, "Give me a pill so I can leave out of here." Requests we reach out to daughter and let her know he'd like to talk with her. Has no complaints/requests aside from wanting some more chocolate protein shakes.  OBJECTIVE:  Vital Signs: Vitals:   01/21/22 2305 01/22/22 0257 01/22/22 0310 01/22/22 0718  BP: 110/70 111/73  109/88  Pulse: 79 80 80 78  Resp: '17 15 18 15  '$ Temp: 98.3 F (36.8 C) 98.2 F (36.8 C)  98.3 F (36.8 C)  TempSrc: Oral Oral  Oral  SpO2: 100% 100% 99% 95%  Weight:      Height:       Supplemental O2: Room Air SpO2: 95 % O2 Flow Rate (L/min): 4 L/min  Filed Weights   01/17/22 1731  Weight: 77.6 kg     Intake/Output Summary (Last 24 hours) at 01/22/2022 1110 Last data filed at 01/22/2022 0600 Gross per 24 hour  Intake 480 ml  Output 2250 ml  Net -1770 ml    Net IO Since Admission: -2,510.23 mL [01/22/22 1110]  Physical Exam: Constitutional:Resting comfortably. In no acute distress. Pulm:Normal work of breathing on room air. Skin:Warm and dry. Neuro:Alert and oriented x3. No focal deficit noted.  Psych: Initially irritable but eases  with conversation  Patient Lines/Drains/Airways Status     Active Line/Drains/Airways     Name Placement date Placement time Site Days   Peripheral IV 01/17/22 20 G Left;Posterior Hand 01/17/22  2306  Hand  4   Peripheral IV 01/18/22 20 G Posterior;Right Hand 01/18/22  1900  Hand  3   External Urinary Catheter 01/18/22  2000  --  3   Incision (Closed) 03/30/16 Sternum Anterior;Mid 03/30/16  1011  -- 2123   Incision (Closed) 03/30/16 Leg Right 03/30/16  1011  -- 2123   Incision (Closed) 04/21/16 Chest Other (Comment) 04/21/16  1715  -- 2101   Incision (Closed) 04/21/16 Chest Right 04/21/16  1715  -- 2101   Incision (Closed) 08/13/17 Chest Other (Comment) 08/13/17  1536  -- 1622             ASSESSMENT/PLAN:  Assessment: Principal Problem:   Hyperglycemia   Plan: #Severe Hyperglycemia, resolved Fasting CBG < 180. Current regimen includes semglee 44 units daily in addition to novolog 12 units TID with meals plus SSI. Will augment short acting today if mealtime glucose continues to be elevated. -Continue Semglee 44 units daily; novolog 12 units WIC plus SSI -Monitor CBGs   #Hyponatremia 2/2 Hyperglycemia  Urine sodium and serum osmolality unremarkable. Sodium 134 today.  -Trend BMP q2days  #Paroxysmal Atrial Fibrillation, CHAD-VASc score of 7 Rate controlled. On metoprolol 25 mg PO daily. -Metoprolol succinate 25 mg daily -Cardiac  monitoring -Eliquis 5 mg BID   #Penile Lesions S/p 1x dose of diflucan 150 mg. Burning pain at penis overnight but seems to have resolved with removal of condom catheter. -Topical lotrimin for 2 weeks   #HFrEF, LV EF 50-55%, NYHA I  Euvolemic on exam, no concern for acute exacerbation. Sating well on room air.  -Monitor volume status   #Hepatic Cirrhosis  #Chronic Leukopenia CBC has been stable. No signs of bleeding or infection. No concern for hepatic encephalopathy or decompensated cirrhosis at this time.    #Normocytic Anemia Hgb has  been stable. Asymptomatic, HDS. Likely anemia of chronic disease. No need for daily CBC.   Best Practice: Diet: Cardiac diet and Diabetic diet IVF: None VTE: SCDs Code: DNR AB: None Therapy Recs: SNF Family Contact: Mary, daughter, called and notified. DISPO: Anticipated discharge to Skilled nursing facility pending Insurance for SNF coverage.  Signature: Linward Natal, MD Internal Medicine Resident, PGY-1 [facility]  Pager: [phone]   Please contact the on call pager after 5 pm and on weekends at [phone].

## 2022-01-22 NOTE — Progress Notes (Signed)
Pt had a 5 beat, then 7 beat run of Vtach. Currently in NSR, 80s. Dr. Dema Severin, resident made aware. [place] lab ordered.

## 2022-01-22 NOTE — Discharge Instructions (Signed)
Information on my medicine - ELIQUIS® (apixaban) ° °Why was Eliquis® prescribed for you? °Eliquis® was prescribed for you to reduce the risk of a blood clot forming that can cause a stroke if you have a medical condition called atrial fibrillation (a type of irregular heartbeat). ° °What do You need to know about Eliquis® ? °Take your Eliquis® TWICE DAILY - one tablet in the morning and one tablet in the evening with or without food. If you have difficulty swallowing the tablet whole please discuss with your pharmacist how to take the medication safely. ° °Take Eliquis® exactly as prescribed by your doctor and DO NOT stop taking Eliquis® without talking to the doctor who prescribed the medication.  Stopping may increase your risk of developing a stroke.  Refill your prescription before you run out. ° °After discharge, you should have regular check-up appointments with your healthcare provider that is prescribing your Eliquis®.  In the future your dose may need to be changed if your kidney function or weight changes by a significant amount or as you get older. ° °What do you do if you miss a dose? °If you miss a dose, take it as soon as you remember on the same day and resume taking twice daily.  Do not take more than one dose of ELIQUIS at the same time to make up a missed dose. ° °Important Safety Information °A possible side effect of Eliquis® is bleeding. You should call your healthcare provider right away if you experience any of the following: °? Bleeding from an injury or your nose that does not stop. °? Unusual colored urine (red or dark [place]) or unusual colored stools (red or black). °? Unusual bruising for unknown reasons. °? A serious fall or if you hit your head (even if there is no bleeding). ° °Some medicines may interact with Eliquis® and might increase your risk of bleeding or clotting while on Eliquis®. To help avoid this, consult your healthcare provider or pharmacist prior to using any new  prescription or non-prescription medications, including herbals, vitamins, non-steroidal anti-inflammatory drugs (NSAIDs) and supplements. ° °This website has more information on Eliquis® (apixaban): http://www.eliquis.com/eliquis/home ° °

## 2022-01-23 DIAGNOSIS — R739 Hyperglycemia, unspecified: Secondary | ICD-10-CM | POA: Diagnosis not present

## 2022-01-23 LAB — CBC WITH DIFFERENTIAL/PLATELET
Abs Immature Granulocytes: 0.04 10*3/uL (ref 0.00–0.07)
Basophils Absolute: 0 10*3/uL (ref 0.0–0.1)
Basophils Relative: 0 %
Eosinophils Absolute: 0.1 10*3/uL (ref 0.0–0.5)
Eosinophils Relative: 2 %
HCT: 25.2 % — ABNORMAL LOW (ref 39.0–52.0)
Hemoglobin: 8.5 g/dL — ABNORMAL LOW (ref 13.0–17.0)
Immature Granulocytes: 2 %
Lymphocytes Relative: 32 %
Lymphs Abs: 0.8 10*3/uL (ref 0.7–4.0)
MCH: 30.9 pg (ref 26.0–34.0)
MCHC: 33.7 g/dL (ref 30.0–36.0)
MCV: 91.6 fL (ref 80.0–100.0)
Monocytes Absolute: 0.1 10*3/uL (ref 0.1–1.0)
Monocytes Relative: 5 %
Neutro Abs: 1.4 10*3/uL — ABNORMAL LOW (ref 1.7–7.7)
Neutrophils Relative %: 59 %
Platelets: 198 10*3/uL (ref 150–400)
RBC: 2.75 MIL/uL — ABNORMAL LOW (ref 4.22–5.81)
RDW: 14.6 % (ref 11.5–15.5)
WBC: 2.4 10*3/uL — ABNORMAL LOW (ref 4.0–10.5)
nRBC: 0 % (ref 0.0–0.2)

## 2022-01-23 LAB — BASIC METABOLIC PANEL
Anion gap: 6 (ref 5–15)
BUN: 16 mg/dL (ref 8–23)
CO2: 23 mmol/L (ref 22–32)
Calcium: 8.7 mg/dL — ABNORMAL LOW (ref 8.9–10.3)
Chloride: 104 mmol/L (ref 98–111)
Creatinine, Ser: 1.02 mg/dL (ref 0.61–1.24)
GFR, Estimated: >60 mL/min (ref >60)
Glucose, Bld: 154 mg/dL — ABNORMAL HIGH (ref 70–99)
Potassium: 4.8 mmol/L (ref 3.5–5.1)
Sodium: 133 mmol/L — ABNORMAL LOW (ref 135–145)

## 2022-01-23 LAB — GLUCOSE, CAPILLARY
Glucose-Capillary: 167 mg/dL — ABNORMAL HIGH (ref 70–99)
Glucose-Capillary: 128 mg/dL — ABNORMAL HIGH (ref 70–99)
Glucose-Capillary: 96 mg/dL (ref 70–99)
Glucose-Capillary: 275 mg/dL — ABNORMAL HIGH (ref 70–99)

## 2022-01-23 MED ORDER — HYDROXYZINE HCL 10 MG PO TABS
10.0000 mg | ORAL_TABLET | Freq: Three times a day (TID) | ORAL | Status: DC | PRN
  Administered 2022-01-23: 10 mg via ORAL
  Filled 2022-01-23 (×3): qty 1

## 2022-01-23 MED ORDER — HYDROXYZINE HCL 25 MG PO TABS
25.0000 mg | ORAL_TABLET | Freq: Three times a day (TID) | ORAL | Status: DC | PRN
  Administered 2022-01-23 – 2022-01-25 (×5): 25 mg via ORAL
  Filled 2022-01-23 (×5): qty 1

## 2022-01-23 MED ORDER — KETOROLAC TROMETHAMINE 10 MG PO TABS
10.0000 mg | ORAL_TABLET | Freq: Three times a day (TID) | ORAL | Status: AC | PRN
  Administered 2022-01-23: 10 mg via ORAL
  Filled 2022-01-23 (×2): qty 1

## 2022-01-23 MED ORDER — KETOROLAC TROMETHAMINE 10 MG PO TABS
10.0000 mg | ORAL_TABLET | Freq: Once | ORAL | Status: DC
  Filled 2022-01-23: qty 1

## 2022-01-23 NOTE — Progress Notes (Signed)
Mary Lac (patients daughter) would like to speak with case management about placement options and transportation.

## 2022-01-23 NOTE — Progress Notes (Addendum)
HD#6 SUBJECTIVE:  Patient Summary: Mark Cabrera is a 71 y.o. with a pertinent PMH of DM, HLD, heart failure, CAD status post CABG, AFib, sleep apnea, compensated cirrhosis, and multiple CVAs, who presented with severe symptomatic hyperglycemia.  Overnight Events: Short runs of vtach yesterday. Mag normal, K 3.4 repleted  Interim History: Patient states he is doing well this morning. No acute complaints.   OBJECTIVE:  Vital Signs: Vitals:   01/23/22 0303 01/23/22 0719 01/23/22 1100 01/23/22 1500  BP: (!) 92/53 117/73 107/72 105/64  Pulse: 80 79 63 65  Resp: '14 16 17 19  '$ Temp: 98.7 F (37.1 C) 98.1 F (36.7 C) 98.1 F (36.7 C) 98.7 F (37.1 C)  TempSrc: Oral Oral Oral Oral  SpO2: 100% 100% 100% 98%  Weight:      Height:       Supplemental O2: Room Air SpO2: 98 % O2 Flow Rate (L/min): 2 L/min  Filed Weights   01/17/22 1731  Weight: 77.6 kg     Intake/Output Summary (Last 24 hours) at 01/23/2022 1532 Last data filed at 01/23/2022 0700 Gross per 24 hour  Intake 960 ml  Output 700 ml  Net 260 ml   Net IO Since Admission: -2,675.23 mL [01/23/22 1532]  Physical Exam: Constitutional: Resting comfortably. In no acute distress. Pulm: no increased respiratory effort or distress Skin: Warm and dry. Neuro: Alert and oriented x3.  Psych: Pleasant, drowsy as he is just waking up  Patient Lines/Drains/Airways Status     Active Line/Drains/Airways     Name Placement date Placement time Site Days   Peripheral IV 01/17/22 20 G Left;Posterior Hand 01/17/22  2306  Hand  4   Peripheral IV 01/18/22 20 G Posterior;Right Hand 01/18/22  1900  Hand  3   External Urinary Catheter 01/18/22  2000  --  3   Incision (Closed) 03/30/16 Sternum Anterior;Mid 03/30/16  1011  -- 2123   Incision (Closed) 03/30/16 Leg Right 03/30/16  1011  -- 2123   Incision (Closed) 04/21/16 Chest Other (Comment) 04/21/16  1715  -- 2101   Incision (Closed) 04/21/16 Chest Right 04/21/16  1715  -- 2101    Incision (Closed) 08/13/17 Chest Other (Comment) 08/13/17  1536  -- 1622             ASSESSMENT/PLAN:  Assessment: Principal Problem:   Hyperglycemia   Plan: #Severe Hyperglycemia, resolved Fasting CBG < 180. Mealtime also at goal. Current regimen includes semglee 44 units daily in addition to novolog 12 units TID with meals plus SSI.  -Continue Semglee 44 units daily; novolog 12 units WIC plus SSI -Monitor CBGs   #Hyponatremia 2/2 Hyperglycemia  Urine sodium and serum osmolality unremarkable. Sodium 133 today.  -Trend BMP q2days  #Paroxysmal Atrial Fibrillation, CHAD-VASc score of 7 Rate controlled. On metoprolol 25 mg PO daily. -Metoprolol succinate 25 mg daily -Cardiac monitoring -Eliquis 5 mg BID   #Penile Lesions S/p 1x dose of diflucan 150 mg.  -Topical lotrimin for 2 weeks   #HFrEF, LV EF 50-55%, NYHA I  Euvolemic on exam, no concern for acute exacerbation. Sating well on room air.  -Monitor volume status   #Hepatic Cirrhosis  #Chronic Leukopenia CBC has been stable. No signs of bleeding or infection. No concern for hepatic encephalopathy or decompensated cirrhosis at this time.    #Normocytic Anemia Hgb 8.5. Asymptomatic, HDS. Likely anemia of chronic disease. No need for daily CBC.   Best Practice: Diet: Cardiac diet and Diabetic diet IVF: None VTE: SCDs  Code: DNR AB: None Therapy Recs: SNF Family Contact: Mary, daughter, called and notified. DISPO: Anticipated discharge to Skilled nursing facility pending Insurance for SNF coverage.  Signature: Linward Natal, MD Internal Medicine Resident, PGY-1 [facility]  Pager: [phone]   Please contact the on call pager after 5 pm and on weekends at [phone].

## 2022-01-23 NOTE — TOC Progression Note (Signed)
Transition of Care [facility]) - Progression Note    Patient Details  Name: Mark Cabrera MRN: [id number] Date of Birth: [birth year]  Transition of Care [facility]) CM/SW [place], [place] Phone Number: 01/23/2022, 12:51 PM  Clinical Narrative:     [name] with [name]) - informed need SNF choice- she requested to send referral to [place]. She states her preferred SNF is [facility]. If [facility] unable to offer, will consider offers from [facility] and [facility].  Spoke with [name]/Admission at [facility]- requested to review referral   TOC will continue to follow and assist with discharge planning.   Expected Discharge Plan: [place] Barriers to Discharge: Continued Medical Work up  Expected Discharge Plan and Services Expected Discharge Plan: [place] In-house Referral: Clinical Social Work     Living arrangements for the past 2 months: Single Family Home                                       Social Determinants of Health (SDOH) Interventions Food Insecurity Interventions: Intervention Not Indicated Housing Interventions: Intervention Not Indicated  Readmission Risk Interventions     No data to display

## 2022-01-23 NOTE — Progress Notes (Signed)
Mobility Specialist Progress Note   01/23/22 1017  Mobility  Activity Refused mobility   Pt refusing mobility stating they "I cannot breathe, I am dying"; attempted to reassure pt w/ stable vitals. Despite an SpO2 of 100% on 4L pt adamant on receiving an oxygen mask before doing anything. RN and PT made aware.    Holland Falling Mobility Specialist Acute Rehab Office:  [phone]

## 2022-01-23 NOTE — Progress Notes (Signed)
PT Cancellation Note  Patient Details Name: Mark Cabrera MRN: [id number] DOB: 09/15/1950   Cancelled Treatment:    Reason Eval/Treat Not Completed: Other (comment). Pt very anxious and demanding a non-rebreather max stating he can't breath despite SpO2 at 100% on 4LO2 via [place]. RN working with medical team to address patient anxiety and requests for NRB mask. PT to return as able, as appropriate to progress mobility.  Kittie Plater, PT, DPT Acute Rehabilitation Services Secure chat preferred Office #: [phone]    Berline Lopes 01/23/2022, 10:38 AM

## 2022-01-23 NOTE — Care Management Important Message (Signed)
Important Message  Patient Details  Name: Mark Cabrera MRN: [id number] Date of Birth: 1950-05-28   Medicare Important Message Given:  Yes     Chadrick Sprinkle Montine Circle 01/23/2022, 3:57 PM

## 2022-01-24 DIAGNOSIS — R739 Hyperglycemia, unspecified: Secondary | ICD-10-CM | POA: Diagnosis not present

## 2022-01-24 DIAGNOSIS — L8990 Pressure ulcer of unspecified site, unspecified stage: Secondary | ICD-10-CM | POA: Insufficient documentation

## 2022-01-24 LAB — BASIC METABOLIC PANEL
Anion gap: 7 (ref 5–15)
BUN: 23 mg/dL (ref 8–23)
CO2: 22 mmol/L (ref 22–32)
Calcium: 8.6 mg/dL — ABNORMAL LOW (ref 8.9–10.3)
Chloride: 103 mmol/L (ref 98–111)
Creatinine, Ser: 1.14 mg/dL (ref 0.61–1.24)
GFR, Estimated: >60 mL/min (ref >60)
Glucose, Bld: 369 mg/dL — ABNORMAL HIGH (ref 70–99)
Potassium: 4.6 mmol/L (ref 3.5–5.1)
Sodium: 132 mmol/L — ABNORMAL LOW (ref 135–145)

## 2022-01-24 LAB — GLUCOSE, CAPILLARY
Glucose-Capillary: 287 mg/dL — ABNORMAL HIGH (ref 70–99)
Glucose-Capillary: 154 mg/dL — ABNORMAL HIGH (ref 70–99)
Glucose-Capillary: 105 mg/dL — ABNORMAL HIGH (ref 70–99)
Glucose-Capillary: 138 mg/dL — ABNORMAL HIGH (ref 70–99)
Glucose-Capillary: 115 mg/dL — ABNORMAL HIGH (ref 70–99)

## 2022-01-24 NOTE — TOC Progression Note (Signed)
Transition of Care [facility]) - Progression Note    Patient Details  Name: Mark Cabrera MRN: [id number] Date of Birth: May 04, 1950  Transition of Care [facility]) CM/SW [place], [place] Phone Number: 01/24/2022, 5:03 PM  Clinical Narrative:     Patient's daughter choose [facility]- sent message to SNF, to confirmed bed offer - waiting on response   Expected Discharge Plan: [place] Barriers to Discharge: Continued Medical Work up  Expected Discharge Plan and Services Expected Discharge Plan: [place] In-house Referral: Clinical Social Work     Living arrangements for the past 2 months: Single Family Home                                       Social Determinants of Health (SDOH) Interventions Food Insecurity Interventions: Intervention Not Indicated Housing Interventions: Intervention Not Indicated  Readmission Risk Interventions     No data to display

## 2022-01-24 NOTE — Progress Notes (Signed)
Occupational Therapy Treatment Patient Details Name: Mark Cabrera MRN: [id number] DOB: Jul 21, 1950 Today's Date: 01/24/2022   History of present illness 71 yo male admitted 11/7 with hyperglycemia, fall, irritability, diarrhea. PMHx: CVA, depression, CAD, CABG, AVR, HLD, DM, HTN   OT comments  Pt completed supine to sit with min guard.  Maintain eyes closed flexed forward sitting EOB to start session.  Oxygen sats at 99-100% on room air throughout session.  Flat affect as well throughout with lackadaisical responses to questions when therapist would ask such as "What month is it?" He replied "who cares".  Pt exhibited increased right drift during mobility in the room to the bathroom as well as in the hallway with use of the RW for support, needing min to mod assist overall.  Min assist for toileting tasks sit to stand.  Feel he will continue to benefit from acute care OT with transition to SNF for follow-up therapy.       Recommendations for follow up therapy are one component of a multi-disciplinary discharge planning process, led by the attending physician.  Recommendations may be updated based on patient status, additional functional criteria and insurance authorization.    Follow Up Recommendations  Skilled nursing-short term rehab (<3 hours/day)     Assistance Recommended at Discharge Frequent or constant Supervision/Assistance  Patient can return home with the following  Assist for transportation;A little help with bathing/dressing/bathroom;A little help with walking and/or transfers   Equipment Recommendations  None recommended by OT       Precautions / Restrictions Precautions Precautions: Fall       Mobility Bed Mobility Overal bed mobility: Needs Assistance Bed Mobility: Supine to Sit     Supine to sit: Min guard Sit to supine: Supervision        Transfers Overall transfer level: Needs assistance Equipment used: Rolling walker (2 wheels) Transfers: Sit to/from  Stand, Bed to chair/wheelchair/BSC Sit to Stand: Min assist     Step pivot transfers: Min assist     General transfer comment: Mod instructional cueing to push up from the surface with standing and to not place both hands on the RW and pull up.     Balance   Sitting-balance support: Feet supported Sitting balance-Leahy Scale: Poor Sitting balance - Comments: Forward and right lean with LOB on one occasion, requiring verbal re-direction and min assist to correct                                   ADL either performed or assessed with clinical judgement   ADL Overall ADL's : Needs assistance/impaired                         Toilet Transfer: Moderate assistance;Ambulation;Regular Toilet;Rolling walker (2 wheels)   Toileting- Clothing Manipulation and Hygiene: Minimal assistance;Sit to/from stand       Functional mobility during ADLs: Minimal assistance;Moderate assistance (min assist to begin with progression to needing mod assist secondary to veering to the right and running into objects.) General ADL Comments: Pt with O2 sats at 99-100% on room air with HR in the mid 80s throughout.  Kept eyes closed a lot of the time in supine and in sitting with max instructional cueing to maintain sustained attention.   Increased lean to the right in standing noted with pt pushing the RW into the wall and other objects on the right side.  Increased agitation at times when running into things.  Max demonstrational cueing to move the walker over to the left and position himself more in the center as well.      Cognition Arousal/Alertness: Awake/alert Behavior During Therapy: Agitated Overall Cognitive Status: Impaired/Different from baseline Area of Impairment: Orientation, Memory, Problem solving, Following commands, Awareness, Safety/judgement                 Orientation Level: Disoriented to, Time   Memory: Decreased short-term memory Following Commands:  Follows one step commands consistently, Follows multi-step commands with increased time Safety/Judgement: Decreased awareness of safety, Decreased awareness of deficits Awareness: Intellectual Problem Solving: Slow processing, Requires verbal cues, Requires tactile cues General Comments: Pt with flat affect and decreased awareness of time.  When asked which month is was he stated "who cares".  He continually veered into objects on the right side when using the walker with poor awareness and max demonstrational cueing to correct.                   Pertinent Vitals/ Pain       Pain Assessment Pain Assessment: Faces Pain Score: 0-No pain         Frequency  Min 2X/week        Progress Toward Goals  OT Goals(current goals can now be found in the care plan section)  Progress towards OT goals: Progressing toward goals  Acute Rehab OT Goals Patient Stated Goal: None stated OT Goal Formulation: With patient Time For Goal Achievement: 02/04/22 Potential to Achieve Goals: Good  Plan Discharge plan remains appropriate       AM-PAC OT "6 Clicks" Daily Activity     Outcome Measure   Help from another person eating meals?: None Help from another person taking care of personal grooming?: A Little Help from another person toileting, which includes using toliet, bedpan, or urinal?: A Little Help from another person bathing (including washing, rinsing, drying)?: A Little Help from another person to put on and taking off regular upper body clothing?: A Little Help from another person to put on and taking off regular lower body clothing?: A Little 6 Click Score: 19    End of Session Equipment Utilized During Treatment: Rolling walker (2 wheels);Gait belt  OT Visit Diagnosis: Unsteadiness on feet (R26.81);Muscle weakness (generalized) (M62.81)   Activity Tolerance Patient tolerated treatment well   Patient Left in bed;with call bell/phone within reach;with bed alarm set;with  nursing/sitter in room   Nurse Communication Mobility status        Time: 1103-1140 OT Time Calculation (min): 37 min  Charges: OT General Charges $OT Visit: 1 Visit OT Treatments $Self Care/Home Management : 23-37 mins  Fallen Crisostomo OTR/L 01/24/2022, 1:59 PM

## 2022-01-24 NOTE — TOC Progression Note (Signed)
Transition of Care [facility]) - Progression Note    Patient Details  Name: Mark Cabrera MRN: [id number] Date of Birth: [birth year]  Transition of Care [facility]) CM/SW [place], [place] Phone Number: 01/24/2022, 11:46 AM  Clinical Narrative:     Called patient's daughter- left voice message to return call. Need SNF choice.  Expected Discharge Plan: [place] Barriers to Discharge: Continued Medical Work up  Expected Discharge Plan and Services Expected Discharge Plan: [place] In-house Referral: Clinical Social Work     Living arrangements for the past 2 months: Single Family Home                                       Social Determinants of Health (SDOH) Interventions Food Insecurity Interventions: Intervention Not Indicated Housing Interventions: Intervention Not Indicated  Readmission Risk Interventions     No data to display

## 2022-01-24 NOTE — Inpatient Diabetes Management (Signed)
Inpatient Diabetes Program Recommendations  AACE/ADA: New Consensus Statement on Inpatient Glycemic Control   Target Ranges:  Prepandial:   less than 140 mg/dL      Peak postprandial:   less than 180 mg/dL (1-2 hours)      Critically ill patients:  140 - 180 mg/dL    Latest Reference Range & Units 01/23/22 07:52 01/23/22 11:03 01/23/22 15:01 01/23/22 21:03 01/24/22 07:52  Glucose-Capillary 70 - 99 mg/dL 167 (H) 128 (H) 96 275 (H) 287 (H)    Review of Glycemic Control  Current orders for Inpatient glycemic control: Semglee 44 units QHS, Novolog 12 units TID with meals, Novolog 0-6 units TID with meals  Inpatient Diabetes Program Recommendations:    Insulin: Glucose 275 mg/dl at 21:03 last night and 287 mg/dl today. No insulin given for HS CBG since none ordered.  Please consider ordering Novolog 0-5 units QHS.  Thanks, Barnie Alderman, RN, MSN, [place] Diabetes Coordinator Inpatient Diabetes Program [phone] (Team Pager from 8am to [place])

## 2022-01-24 NOTE — Progress Notes (Signed)
Pt stable during shift. Second dose of atarax effective. Report given to on coming RN. Delia Heady RN

## 2022-01-24 NOTE — Progress Notes (Signed)
PT Cancellation Note  Patient Details Name: Mark Cabrera MRN: [id number] DOB: 1950/08/28   Cancelled Treatment:    Reason Eval/Treat Not Completed: Patient declined, no reason specified"[name]!" Despite encouragement and coaxing, pt declineD PT services.Will follow. Eyes remained closed.    Marguarite Arbour A Kaiyu Mirabal 01/24/2022, 2:37 PM Marisa Severin, PT, DPT Acute Rehabilitation Services Secure chat preferred Office [phone]

## 2022-01-24 NOTE — Progress Notes (Addendum)
HD#7 SUBJECTIVE:  Patient Summary: Mark Cabrera is a 71 y.o. with a pertinent PMH of DM, HLD, heart failure, CAD status post CABG, AFib, sleep apnea, compensated cirrhosis, and multiple CVAs, who presented with severe symptomatic hyperglycemia.  Overnight Events: NAEO  Interim History:  Patient states he feels well this morning and was sleeping peacefully. No complaints.  OBJECTIVE:  Vital Signs: Vitals:   01/23/22 1919 01/23/22 2247 01/24/22 0303 01/24/22 0405  BP: 134/63 108/64 (!) 112/57 103/61  Pulse: 64 65 75 (!) 51  Resp: '20 18 14 16  '$ Temp: 98.4 F (36.9 C) 98.6 F (37 C) 98 F (36.7 C)   TempSrc: Oral Oral Oral   SpO2: 99% 99% 99% 97%  Weight:      Height:       Supplemental O2: Room Air SpO2: 97 % O2 Flow Rate (L/min): 2 L/min  Filed Weights   01/17/22 1731  Weight: 77.6 kg     Intake/Output Summary (Last 24 hours) at 01/24/2022 0613 Last data filed at 01/23/2022 0700 Gross per 24 hour  Intake 240 ml  Output --  Net 240 ml   Net IO Since Admission: -2,675.23 mL [01/24/22 0613]  Physical Exam: Constitutional: Resting comfortably. In no acute distress. Pulm: no increased respiratory effort or distress, sating well on RA Skin: Warm and dry. Mild erythema at sacrum that is blanchable. Skin intact. Mepilex covering sacrum. Psych: Pleasant, drowsy as he is just waking up  Patient Lines/Drains/Airways Status     Active Line/Drains/Airways     Name Placement date Placement time Site Days   Peripheral IV 01/17/22 20 G Left;Posterior Hand 01/17/22  2306  Hand  4   Peripheral IV 01/18/22 20 G Posterior;Right Hand 01/18/22  1900  Hand  3   External Urinary Catheter 01/18/22  2000  --  3   Incision (Closed) 03/30/16 Sternum Anterior;Mid 03/30/16  1011  -- 2123   Incision (Closed) 03/30/16 Leg Right 03/30/16  1011  -- 2123   Incision (Closed) 04/21/16 Chest Other (Comment) 04/21/16  1715  -- 2101   Incision (Closed) 04/21/16 Chest Right 04/21/16  1715  --  2101   Incision (Closed) 08/13/17 Chest Other (Comment) 08/13/17  1536  -- 1622             ASSESSMENT/PLAN:  Assessment: Principal Problem:   Hyperglycemia   Plan: #Severe Hyperglycemia, resolved #T2DM Had been well controlled until yesterday evening. High postprandials over night though appears did not get fast acting. No fasting glucose yet today but will adjust mealtime if necessary. -Continue Semglee 44 units daily; novolog 12 units WIC plus SSI -Monitor CBGs   #Hyponatremia 2/2 Hyperglycemia  Urine sodium and serum osmolality unremarkable. Sodium 132 today.  -Trend BMP q2days  #Paroxysmal Atrial Fibrillation, CHAD-VASc score of 7 Rate controlled. On metoprolol 25 mg PO daily. Intermittent bradycardia but asymptomatic. Will continue to monitor. May need to decrease metoprolol dose if persistently bradycardic. -Metoprolol succinate 25 mg daily -Cardiac monitoring -Eliquis 5 mg BID   #Penile Lesions S/p 1x dose of diflucan 150 mg.  -Topical lotrimin for 2 weeks   #HFrEF, LV EF 50-55%, NYHA I  Euvolemic on exam, no concern for acute exacerbation. Sating well on room air. Had one brief desat on RA but without good waveform and rapid recovery to normal. -Monitor volume status   #Hepatic Cirrhosis  #Chronic Leukopenia CBC has been stable. No signs of bleeding or infection. No concern for hepatic encephalopathy or decompensated cirrhosis at this  time.    #Normocytic Anemia Hgb 8.5. Asymptomatic, HDS. Likely anemia of chronic disease. No need for daily CBC.   #Sacral erythema Patient has blanchable area of focal erythema at sacrum visualized upon removal of Mepilex. Do not feel this to be a Stage I ulcer but will continue to monitor. -continue mepilex  -OOB  Best Practice: Diet: Cardiac diet and Diabetic diet IVF: None VTE: SCDs Code: DNR AB: None Therapy Recs: SNF Family Contact: Mary, daughter, called and notified. DISPO: Anticipated discharge to Skilled  nursing facility pending Insurance for SNF coverage.  Signature: Linward Natal, MD Internal Medicine Resident, PGY-1 [facility]  Pager: [phone]   Please contact the on call pager after 5 pm and on weekends at [phone].

## 2022-01-25 ENCOUNTER — Other Ambulatory Visit (HOSPITAL_COMMUNITY): Payer: Self-pay

## 2022-01-25 DIAGNOSIS — D509 Iron deficiency anemia, unspecified: Secondary | ICD-10-CM | POA: Diagnosis present

## 2022-01-25 DIAGNOSIS — I872 Venous insufficiency (chronic) (peripheral): Secondary | ICD-10-CM | POA: Diagnosis present

## 2022-01-25 DIAGNOSIS — R4189 Other symptoms and signs involving cognitive functions and awareness: Secondary | ICD-10-CM | POA: Diagnosis not present

## 2022-01-25 DIAGNOSIS — R2689 Other abnormalities of gait and mobility: Secondary | ICD-10-CM | POA: Diagnosis not present

## 2022-01-25 DIAGNOSIS — N179 Acute kidney failure, unspecified: Secondary | ICD-10-CM | POA: Diagnosis present

## 2022-01-25 DIAGNOSIS — B3742 Candidal balanitis: Secondary | ICD-10-CM | POA: Diagnosis not present

## 2022-01-25 DIAGNOSIS — E1142 Type 2 diabetes mellitus with diabetic polyneuropathy: Secondary | ICD-10-CM | POA: Diagnosis not present

## 2022-01-25 DIAGNOSIS — R609 Edema, unspecified: Secondary | ICD-10-CM | POA: Diagnosis not present

## 2022-01-25 DIAGNOSIS — I482 Chronic atrial fibrillation, unspecified: Secondary | ICD-10-CM | POA: Diagnosis not present

## 2022-01-25 DIAGNOSIS — Z66 Do not resuscitate: Secondary | ICD-10-CM | POA: Diagnosis present

## 2022-01-25 DIAGNOSIS — R918 Other nonspecific abnormal finding of lung field: Secondary | ICD-10-CM | POA: Diagnosis not present

## 2022-01-25 DIAGNOSIS — B372 Candidiasis of skin and nail: Secondary | ICD-10-CM | POA: Diagnosis not present

## 2022-01-25 DIAGNOSIS — Z888 Allergy status to other drugs, medicaments and biological substances status: Secondary | ICD-10-CM | POA: Diagnosis not present

## 2022-01-25 DIAGNOSIS — Z952 Presence of prosthetic heart valve: Secondary | ICD-10-CM | POA: Diagnosis not present

## 2022-01-25 DIAGNOSIS — F411 Generalized anxiety disorder: Secondary | ICD-10-CM | POA: Diagnosis not present

## 2022-01-25 DIAGNOSIS — D72819 Decreased white blood cell count, unspecified: Secondary | ICD-10-CM | POA: Diagnosis not present

## 2022-01-25 DIAGNOSIS — K0889 Other specified disorders of teeth and supporting structures: Secondary | ICD-10-CM | POA: Diagnosis not present

## 2022-01-25 DIAGNOSIS — F32A Depression, unspecified: Secondary | ICD-10-CM | POA: Diagnosis present

## 2022-01-25 DIAGNOSIS — E782 Mixed hyperlipidemia: Secondary | ICD-10-CM | POA: Diagnosis not present

## 2022-01-25 DIAGNOSIS — R293 Abnormal posture: Secondary | ICD-10-CM | POA: Diagnosis not present

## 2022-01-25 DIAGNOSIS — R296 Repeated falls: Secondary | ICD-10-CM | POA: Diagnosis not present

## 2022-01-25 DIAGNOSIS — Z79899 Other long term (current) drug therapy: Secondary | ICD-10-CM | POA: Diagnosis not present

## 2022-01-25 DIAGNOSIS — I5030 Unspecified diastolic (congestive) heart failure: Secondary | ICD-10-CM | POA: Diagnosis not present

## 2022-01-25 DIAGNOSIS — I5021 Acute systolic (congestive) heart failure: Secondary | ICD-10-CM | POA: Diagnosis not present

## 2022-01-25 DIAGNOSIS — I2510 Atherosclerotic heart disease of native coronary artery without angina pectoris: Secondary | ICD-10-CM | POA: Diagnosis not present

## 2022-01-25 DIAGNOSIS — I110 Hypertensive heart disease with heart failure: Secondary | ICD-10-CM | POA: Diagnosis not present

## 2022-01-25 DIAGNOSIS — I25810 Atherosclerosis of coronary artery bypass graft(s) without angina pectoris: Secondary | ICD-10-CM | POA: Diagnosis present

## 2022-01-25 DIAGNOSIS — E785 Hyperlipidemia, unspecified: Secondary | ICD-10-CM | POA: Diagnosis present

## 2022-01-25 DIAGNOSIS — R0609 Other forms of dyspnea: Secondary | ICD-10-CM | POA: Diagnosis not present

## 2022-01-25 DIAGNOSIS — E119 Type 2 diabetes mellitus without complications: Secondary | ICD-10-CM | POA: Diagnosis not present

## 2022-01-25 DIAGNOSIS — R0600 Dyspnea, unspecified: Secondary | ICD-10-CM | POA: Diagnosis not present

## 2022-01-25 DIAGNOSIS — K7469 Other cirrhosis of liver: Secondary | ICD-10-CM | POA: Diagnosis not present

## 2022-01-25 DIAGNOSIS — J90 Pleural effusion, not elsewhere classified: Secondary | ICD-10-CM | POA: Diagnosis not present

## 2022-01-25 DIAGNOSIS — R600 Localized edema: Secondary | ICD-10-CM | POA: Diagnosis not present

## 2022-01-25 DIAGNOSIS — I509 Heart failure, unspecified: Secondary | ICD-10-CM | POA: Diagnosis not present

## 2022-01-25 DIAGNOSIS — R7889 Finding of other specified substances, not normally found in blood: Secondary | ICD-10-CM | POA: Diagnosis not present

## 2022-01-25 DIAGNOSIS — I5033 Acute on chronic diastolic (congestive) heart failure: Secondary | ICD-10-CM | POA: Diagnosis present

## 2022-01-25 DIAGNOSIS — I4891 Unspecified atrial fibrillation: Secondary | ICD-10-CM | POA: Diagnosis not present

## 2022-01-25 DIAGNOSIS — I480 Paroxysmal atrial fibrillation: Secondary | ICD-10-CM | POA: Diagnosis present

## 2022-01-25 DIAGNOSIS — I5031 Acute diastolic (congestive) heart failure: Secondary | ICD-10-CM | POA: Diagnosis not present

## 2022-01-25 DIAGNOSIS — J918 Pleural effusion in other conditions classified elsewhere: Secondary | ICD-10-CM | POA: Diagnosis present

## 2022-01-25 DIAGNOSIS — G4730 Sleep apnea, unspecified: Secondary | ICD-10-CM | POA: Diagnosis not present

## 2022-01-25 DIAGNOSIS — K5900 Constipation, unspecified: Secondary | ICD-10-CM | POA: Diagnosis not present

## 2022-01-25 DIAGNOSIS — A6002 Herpesviral infection of other male genital organs: Secondary | ICD-10-CM | POA: Diagnosis present

## 2022-01-25 DIAGNOSIS — R0602 Shortness of breath: Secondary | ICD-10-CM | POA: Diagnosis not present

## 2022-01-25 DIAGNOSIS — D539 Nutritional anemia, unspecified: Secondary | ICD-10-CM | POA: Diagnosis present

## 2022-01-25 DIAGNOSIS — M6281 Muscle weakness (generalized): Secondary | ICD-10-CM | POA: Diagnosis not present

## 2022-01-25 DIAGNOSIS — Z1152 Encounter for screening for COVID-19: Secondary | ICD-10-CM | POA: Diagnosis not present

## 2022-01-25 DIAGNOSIS — I25118 Atherosclerotic heart disease of native coronary artery with other forms of angina pectoris: Secondary | ICD-10-CM | POA: Diagnosis not present

## 2022-01-25 DIAGNOSIS — Z880 Allergy status to penicillin: Secondary | ICD-10-CM | POA: Diagnosis not present

## 2022-01-25 DIAGNOSIS — E1165 Type 2 diabetes mellitus with hyperglycemia: Secondary | ICD-10-CM | POA: Diagnosis not present

## 2022-01-25 DIAGNOSIS — Z794 Long term (current) use of insulin: Secondary | ICD-10-CM | POA: Diagnosis not present

## 2022-01-25 DIAGNOSIS — R739 Hyperglycemia, unspecified: Secondary | ICD-10-CM | POA: Diagnosis not present

## 2022-01-25 DIAGNOSIS — K7460 Unspecified cirrhosis of liver: Secondary | ICD-10-CM | POA: Diagnosis not present

## 2022-01-25 DIAGNOSIS — F0393 Unspecified dementia, unspecified severity, with mood disturbance: Secondary | ICD-10-CM | POA: Diagnosis present

## 2022-01-25 DIAGNOSIS — Z8616 Personal history of COVID-19: Secondary | ICD-10-CM | POA: Diagnosis not present

## 2022-01-25 LAB — GLUCOSE, CAPILLARY
Glucose-Capillary: 173 mg/dL — ABNORMAL HIGH (ref 70–99)
Glucose-Capillary: 167 mg/dL — ABNORMAL HIGH (ref 70–99)

## 2022-01-25 MED ORDER — METOPROLOL SUCCINATE ER 25 MG PO TB24
12.5000 mg | ORAL_TABLET | Freq: Every day | ORAL | 2 refills | Status: AC
  Filled 2022-01-25: qty 15, 30d supply, fill #0

## 2022-01-25 MED ORDER — NOVOLOG FLEXPEN RELION 100 UNIT/ML ~~LOC~~ SOPN
PEN_INJECTOR | SUBCUTANEOUS | 0 refills | Status: DC
  Filled 2022-01-25: qty 45, fill #0

## 2022-01-25 MED ORDER — METOPROLOL SUCCINATE ER 25 MG PO TB24
12.5000 mg | ORAL_TABLET | Freq: Every day | ORAL | Status: DC
  Administered 2022-01-25: 12.5 mg via ORAL

## 2022-01-25 MED ORDER — CLOTRIMAZOLE 1 % EX CREA
TOPICAL_CREAM | Freq: Two times a day (BID) | CUTANEOUS | 0 refills | Status: AC
  Filled 2022-01-25: qty 28, 7d supply, fill #0

## 2022-01-25 NOTE — Progress Notes (Signed)
Physical Therapy Treatment Patient Details Name: Mark Cabrera MRN: [id number] DOB: 1950-07-14 Today's Date: 01/25/2022   History of Present Illness 71 yo male admitted 11/7 with hyperglycemia, fall, irritability, diarrhea. PMHx: CVA, depression, CAD, CABG, AVR, HLD, DM, HTN    PT Comments    With max encouragement pt agreeable to ambulate with PT. Pt then assisted to commode due to pt need to have BM. Pt with noted diarrhea, RN aware. Despite depressed spirits and perseveration on desire to die pt did cooperate with PT. Pt c/o this session is back pain. Acute PT to cont to follow.     Recommendations for follow up therapy are one component of a multi-disciplinary discharge planning process, led by the attending physician.  Recommendations may be updated based on patient status, additional functional criteria and insurance authorization.  Follow Up Recommendations  Skilled nursing-short term rehab (<3 hours/day) Can patient physically be transported by private vehicle: Yes   Assistance Recommended at Discharge Intermittent Supervision/Assistance  Patient can return home with the following A little help with walking and/or transfers;A little help with bathing/dressing/bathroom;Direct supervision/assist for financial management;Direct supervision/assist for medications management;Assist for transportation;Help with stairs or ramp for entrance;Assistance with cooking/housework   Equipment Recommendations  Rolling walker (2 wheels)    Recommendations for Other Services       Precautions / Restrictions Precautions Precautions: Fall Precaution Comments: pt constantly talking about wanting to die Restrictions Weight Bearing Restrictions: No     Mobility  Bed Mobility Overal bed mobility: Needs Assistance Bed Mobility: Supine to Sit, Sit to Supine     Supine to sit: Min guard Sit to supine: Min guard   General bed mobility comments: min guard for safety, labored effort due to  back pain, pt with lack of control descending to sitting on EOB with strong posterior lean requiring minA to come back upright    Transfers Overall transfer level: Needs assistance Equipment used: Rolling walker (2 wheels) Transfers: Sit to/from Stand, Bed to chair/wheelchair/BSC Sit to Stand: Min assist   Step pivot transfers: Min assist       General transfer comment: verbal cues for safe hand placement, minA to power up and control descent back down, complete multiple sit to stands from EOB, commode, and chair, pt desired to return to bed as the chair was hurting his back and required modA as pt impulsively got up and didn't use a RW    Ambulation/Gait Ambulation/Gait assistance: Min assist Gait Distance (Feet): 120 Feet Assistive device: Rolling walker (2 wheels) Gait Pattern/deviations: Step-through pattern, Decreased stride length, Trunk flexed Gait velocity: dec Gait velocity interpretation: <1.8 ft/sec, indicate of risk for recurrent falls   General Gait Details: pt vearing to the R, increased UE dependence, noted to have decreased R LE step height and with onset of fatigue began dragging it   Stairs             Wheelchair Mobility    Modified Rankin (Stroke Patients Only)       Balance Overall balance assessment: Needs assistance Sitting-balance support: Feet supported, No upper extremity supported Sitting balance-Leahy Scale: Fair     Standing balance support: Reliant on assistive device for balance Standing balance-Leahy Scale: Poor                              Cognition Arousal/Alertness: Awake/alert Behavior During Therapy: Flat affect (not engaging and very irritated he "hasn't left this world yet") Overall  Cognitive Status: No family/caregiver present to determine baseline cognitive functioning                                 General Comments: pt with decreased insight to safety and deficits perseverating on desire to  die so he "doesn't have to deal with this world anymore." However pt did follow commands and participate with therapy. Pt was aware of needing to use the bathroom        Exercises      General Comments General comments (skin integrity, edema, etc.): assisted to commode, pt did stand and complete hygiene s/p BM with unilateral UE support, pt declined washing hands, sanitizer given, VSS      Pertinent Vitals/Pain Pain Assessment Pain Assessment: Faces Faces Pain Scale: Hurts even more Pain Location: back Pain Descriptors / Indicators: Aching Pain Intervention(s): Monitored during session    Home Living                          Prior Function            PT Goals (current goals can now be found in the care plan section) Acute Rehab PT Goals Patient Stated Goal: "to die" Progress towards PT goals: Progressing toward goals    Frequency    Min 3X/week      PT Plan Current plan remains appropriate    Co-evaluation              AM-PAC PT "6 Clicks" Mobility   Outcome Measure  Help needed turning from your back to your side while in a flat bed without using bedrails?: A Little Help needed moving from lying on your back to sitting on the side of a flat bed without using bedrails?: A Little Help needed moving to and from a bed to a chair (including a wheelchair)?: A Little Help needed standing up from a chair using your arms (e.g., wheelchair or bedside chair)?: A Little Help needed to walk in hospital room?: A Lot Help needed climbing 3-5 steps with a railing? : A Lot 6 Click Score: 16    End of Session Equipment Utilized During Treatment: Gait belt Activity Tolerance: Patient tolerated treatment well Patient left: in bed;with bed alarm set;with call bell/phone within reach Nurse Communication: Mobility status (request for glucerna) PT Visit Diagnosis: Other abnormalities of gait and mobility (R26.89);History of falling (Z91.81);Muscle weakness  (generalized) (M62.81)     Time: 1749-4496 PT Time Calculation (min) (ACUTE ONLY): 33 min  Charges:  $Gait Training: 23-37 mins                     Kittie Plater, PT, DPT Acute Rehabilitation Services Secure chat preferred Office #: [phone]    Berline Lopes 01/25/2022, 1:13 PM

## 2022-01-25 NOTE — TOC Progression Note (Signed)
Transition of Care [facility]) - Progression Note    Patient Details  Name: Mark Cabrera MRN: [id number] Date of Birth: [birth year]  Transition of Care [facility]) CM/SW [place], [place] Phone Number: 01/25/2022, 11:02 AM  Clinical Narrative:     CSW spoke with patient's daughter, Ronney Lion unable to admit- she has selected [facility].  Sent message to [place]- waiting on response   Thurmond Butts, MSW, LCSW Clinical Social Worker    Expected Discharge Plan: Skilled Nursing Facility Barriers to Discharge: Continued Medical Work up  Expected Discharge Plan and Services Expected Discharge Plan: [place] In-house Referral: Clinical Social Work     Living arrangements for the past 2 months: Single Family Home                                       Social Determinants of Health (SDOH) Interventions Food Insecurity Interventions: Intervention Not Indicated Housing Interventions: Intervention Not Indicated  Readmission Risk Interventions     No data to display

## 2022-01-25 NOTE — Discharge Summary (Addendum)
Name: Mark Cabrera MRN: [id number] DOB: 11/06/1950 71 y.o. PCP: [name], MD  Date of Admission: 01/17/2022  5:15 PM Date of Discharge: 01/25/2022 Attending Physician: Charise Killian, MD  Discharge Diagnosis: 1. Principal Problem:   Hyperglycemia Active Problems:   Pressure injury of skin   Discharge Medications: Allergies as of 01/25/2022       Reactions   Morpholine Salicylate Other (See Comments)   Hallucinations   Penicillins Other (See Comments)   Allergic- reaction?? Has patient had a PCN reaction causing immediate rash, facial/tongue/throat swelling, SOB or lightheadedness with hypotension: Unk Has patient had a PCN reaction causing severe rash involving mucus membranes or skin necrosis: Unk Has patient had a PCN reaction that required hospitalization: Unk Has patient had a PCN reaction occurring within the last 10 years: No If all of the above answers are "NO", then may proceed with Cephalosporin use.   Morphine And Related Other (See Comments)   Hallucinations   Sglt2 Inhibitors Rash, Other (See Comments)   Candidiasis infection prone   Statins Rash, Other (See Comments)   Severe rash and back pain, and muscle pain        Medication List     STOP taking these medications    lactulose 10 GM/15ML solution Commonly known as: CHRONULAC   valACYclovir 1000 MG tablet Commonly known as: VALTREX       TAKE these medications    Accu-Chek Guide test strip Generic drug: glucose blood Use as instructed   Accu-Chek Softclix Lancets lancets Use as instructed   acetaminophen 325 MG tablet Commonly known as: TYLENOL Take 2 tablets (650 mg total) by mouth every 4 (four) hours as needed for headache or mild pain.   clotrimazole 1 % cream Commonly known as: LOTRIMIN Apply topically 2 (two) times daily for 7 days.   cyanocobalamin 100 MCG tablet Commonly known as: VITAMIN B12 Take 100 mcg by mouth daily.   Eliquis 5 MG Tabs tablet Generic drug:  apixaban Take 1 tablet by mouth twice daily   escitalopram 10 MG tablet Commonly known as: LEXAPRO Take 1 tablet (10 mg total) by mouth daily.   hydrOXYzine 10 MG/5ML syrup Commonly known as: ATARAX Take 5 mLs (10 mg total) by mouth 3 (three) times daily as needed for anxiety.   Insulin Pen Needle 31G X 5 MM Misc Use one pen needle three times daily. Dx E11.49   metFORMIN 500 MG tablet Commonly known as: Glucophage Take 1 tablet (500 mg total) by mouth 2 (two) times daily with a meal.   metoprolol succinate 25 MG 24 hr tablet Commonly known as: TOPROL-XL Take 0.5 tablets (12.5 mg total) by mouth daily. Start taking on: January 26, 2022   NovoLOG FlexPen 100 UNIT/ML FlexPen Generic drug: insulin aspart INJECT 12 UNITS UNDER THE SKIN THREE TIMES DAILY WITH MEALS What changed: additional instructions   polyethylene glycol 17 g packet Commonly known as: MIRALAX / GLYCOLAX Take 17 g by mouth daily.   Repatha SureClick 427 MG/ML Soaj Generic drug: Evolocumab INJECT 1 PEN INTO THE SKIN EVERY 14 (FOURTEEN) DAYS.   Soliqua 100-33 UNT-MCG/ML Sopn Generic drug: Insulin Glargine-Lixisenatide Inject 40 Units into the skin daily.               Discharge Care Instructions  (From admission, onward)           Start     Ordered   01/25/22 0000  Discharge wound care:       Comments: Sacrum  with focal blanchable erythema but no skin breakdown. Continue with Mepilex.   01/25/22 1405   01/25/22 0000  Discharge wound care:       Comments: Sacrum with blanchable erythema without skin breakdown. Continue with mepilex.   01/25/22 1454            Disposition and follow-up:   Mark Cabrera was discharged from [facility] in Good condition.  At the hospital follow up visit please address:  1.  T2DM with severe hyperglycemia: A1c 14% on admission but likely not adherent to home regimen. Will discharge on home regimen including metformin, Soliqua 40 units  daily, and Novolog 12 units with meals. Please follow up to ensure adherence, discuss glucose monitoring, and repeat A1c in 3 months.   Cognitive decline: Patient's daughter reported fatigue, irritability, and forgetfulness in recent months.  Patient with some delirium this admission.  He does not have a formal diagnosis of dementia, though daughters reports and patient's occasional disorientation during admission warrant further evaluation of his cognition.  He did endorse thoughts of dying early in admission, but was not felt to be at imminent risk of self-harm but rather frustrated about his hospitalization.  His agitation improved during hospitalization and was only found to have anxiety intermittently which was managed well with hydroxyzine as needed.  Paroxysmal atrial fibrillation: CHAD-VASc score 7. Rate controlled with metoprolol succinate 25 mg daily though with some mild, occasional bradycardia. Will discharge on metoprolol succinate 12.5 mg daily and Eliquis 5 mg BID. F/u to ensure continued rate control without bradycardia.  Candidal balanitis: Patient with resolution of erythema and skin peeling this admission with 1 time dose diflucan 150 mg as well as lotrimin BID. Will continue this at discharge for total 2 week course.  Compensated cirrhosis: Patient had not been taking lactulose prior to admission. Did not receive during admission. No concerns for hepatic encephalopathy. Will discontinue but please reconsider restarting if future if felt appropriate.  Follow-up Appointments:  Contact information for after-discharge care     Destination     [facility] Preferred SNF .   Service: Skilled Nursing Contact information: [place] [address] Hospital Course by problem list: 1. 1. Severe hyperglycemia: Patient presented with glucose level over 600, A1c >14% and it was reported that he was  not taking any of his medications.  Beta hydroxybutyrate 1.93, CO2 19, anion gap 15. CBC with stable leukopenia and anemia, sodium 123 but normal with correction for glucose.  Tachycardic and mildly hypertensive.  Osmolality 305, pH 7.43.  Creatinine elevated at 1.32 from normal baseline 3 months ago. UA with evidence of ketones, glucosuria, negative for infection.  Patient was given IV fluids and placed on insulin infusion given concern for early DKA versus HHS vs severe hyperglycemia.  Insulin infusion was stopped night of admission for a period of time due to significant hypokalemia which was repleted and improved.  Blood glucose lowered to 200-300s.  Anion gap closed.  Patient was transitioned to subcutaneous long-acting and short acting insulin this morning by hospital day 2.  Glucose labile as we titrated his insulin, but able to achieve decent control by discharge with insulin glargine 44 units nightly, NovoLog 12 units with meals plus sliding scale.  Will discharge on modified home regimen though patient needs to follow closely with PCP to ensure this  is appropriate with frequent outpatient monitoring.  Discharge Exam:   BP 113/63 (BP Location: Right Arm)   Pulse 60   Temp 97.8 F (36.6 C) (Oral)   Resp (!) 21   Ht '5\' 8"'$  (1.727 m)   Wt 77.6 kg   SpO2 100%   BMI 26.00 kg/m  Discharge exam:  Physical Exam Constitutional:      General: He is not in acute distress. HENT:     Head: Normocephalic and atraumatic.  Eyes:     Extraocular Movements: Extraocular movements intact.  Pulmonary:     Effort: Pulmonary effort is normal.  Skin:    General: Skin is warm and dry.  Neurological:     Mental Status: He is alert.  Psychiatric:     Comments: Depressed though pleasant   Pertinent Labs, Studies, and Procedures:  DG Chest 2 View  Result Date: 01/17/2022 CLINICAL DATA:  Altered mental status EXAM: CHEST - 2 VIEW COMPARISON:  09/24/2021 FINDINGS: Valve prosthesis and atrial appendage  clip. Borderline to mild cardiomegaly. No acute airspace disease, pleural effusion, or pneumothorax. IMPRESSION: No active cardiopulmonary disease. Borderline to mild cardiomegaly. Electronically Signed   By: Donavan Foil M.D.   On: 01/17/2022 20:48   CT ABDOMEN PELVIS W CONTRAST  Result Date: 01/17/2022 CLINICAL DATA:  Fever and diarrhea. EXAM: CT ABDOMEN AND PELVIS WITH CONTRAST TECHNIQUE: Multidetector CT imaging of the abdomen and pelvis was performed using the standard protocol following bolus administration of intravenous contrast. RADIATION DOSE REDUCTION: This exam was performed according to the departmental dose-optimization program which includes automated exposure control, adjustment of the mA and/or kV according to patient size and/or use of iterative reconstruction technique. CONTRAST:  71m OMNIPAQUE IOHEXOL 350 MG/ML SOLN COMPARISON:  September 24, 2021 FINDINGS: Lower chest: Mild atelectasis is seen within the bilateral lung bases. Hepatobiliary: The liver is cirrhotic in appearance. Diffuse fatty infiltration of the liver parenchyma is noted. No focal liver abnormality is seen. No gallstones, gallbladder wall thickening, or biliary dilatation. Pancreas: Unremarkable. No pancreatic ductal dilatation or surrounding inflammatory changes. Spleen: The spleen is mildly enlarged. Adrenals/Urinary Tract: Adrenal glands are unremarkable. Kidneys are normal, without renal calculi, focal lesion, or hydronephrosis. Mild, stable diffuse urinary bladder wall thickening is noted. Stomach/Bowel: Stomach is within normal limits. Appendix appears normal. No evidence of bowel wall thickening, distention, or inflammatory changes. Vascular/Lymphatic: Aortic atherosclerosis. No enlarged abdominal or pelvic lymph nodes. Reproductive: Prostate gland is mildly enlarged. Other: A 2.6 cm x 1.6 cm fat containing left inguinal hernia is seen. No abdominopelvic ascites. Musculoskeletal: No acute or significant osseous findings.  IMPRESSION: 1. Hepatic steatosis with cirrhosis. 2. Mild splenomegaly. 3. Small fat containing left inguinal hernia. 4. Aortic atherosclerosis. Aortic Atherosclerosis (ICD10-I70.0). Electronically Signed   By: TVirgina NorfolkM.D.   On: 01/17/2022 20:42   CT HEAD WO CONTRAST (5MM)  Result Date: 01/17/2022 CLINICAL DATA:  Diarrhea and fatigue. EXAM: CT HEAD WITHOUT CONTRAST TECHNIQUE: Contiguous axial images were obtained from the base of the skull through the vertex without intravenous contrast. RADIATION DOSE REDUCTION: This exam was performed according to the departmental dose-optimization program which includes automated exposure control, adjustment of the mA and/or kV according to patient size and/or use of iterative reconstruction technique. COMPARISON:  November 08, 2020 FINDINGS: Brain: There is mild cerebral atrophy with widening of the extra-axial spaces and ventricular dilatation. There are areas of decreased attenuation within the Doll Frazee matter tracts of the supratentorial brain, consistent with microvascular disease changes. A small chronic left  occipital lobe infarct is seen. Vascular: There is moderate to marked severity calcification of the bilateral cavernous carotid arteries. Skull: Normal. Negative for fracture or focal lesion. Sinuses/Orbits: Postsurgical changes are again seen within the left globe. Other: None. IMPRESSION: 1. No acute intracranial abnormality. 2. Small chronic left occipital lobe infarct. 3. Generalized cerebral atrophy with widening of the extra-axial spaces and ventricular dilatation. Electronically Signed   By: Virgina Norfolk M.D.   On: 01/17/2022 20:36    Discharge Instructions: Discharge Instructions                    Discharge instructions   Complete by: As directed    Mark Cabrera,  It was a pleasure caring for you during your admission here at [facility]. You came in with severely elevated blood sugar. You were treated with insulin and your blood sugar  is now well controlled. Please note the following items: -Resume your previous regimen of metformin 500 mg twice daily as well as Soliqua 40 units daily -Change to Novolog 12 units with meals.  -Please schedule a follow up appointment with your primary care provider in 1 to 2 weeks -Please continue to apply Lotrimin to your penis twice a day for 1 week   Discharge wound care:   Complete by: As directed    Sacrum with focal blanchable erythema but no skin breakdown. Continue with Mepilex.   Discharge wound care:   Complete by: As directed    Sacrum with blanchable erythema without skin breakdown. Continue with mepilex.   Increase activity slowly   Complete by: As directed    Increase activity slowly   Complete by: As directed        Signed: Linward Natal, MD 01/25/2022, 2:54 PM   Pager: [phone]

## 2022-01-25 NOTE — TOC Transition Note (Addendum)
Transition of Care [facility]) - CM/SW Discharge Note   Patient Details  Name: Mark Cabrera MRN: [id number] Date of Birth: 1950/07/12  Transition of Care [facility]) CM/SW Contact:  Vinie Sill, LCSW Phone Number: 01/25/2022, 2:36 PM   Clinical Narrative:     Patient will Discharge to: [facility]  Discharge Date: 01/25/2022 Family Notified: daughter Transport [id number]  Per MD patient is ready for discharge. RN, patient, and facility notified of discharge. Discharge Summary sent to facility. RN given number for [phone], Room 3243.  Clinical Social Worker signing off.  Thurmond Butts, MSW, LCSW Clinical Social Worker     Final next level of care: Skilled Nursing Facility Barriers to Discharge: Barriers Resolved   Patient Goals and CMS Choice        Discharge Placement              Patient chooses bed at: [facility] Patient to be transferred to facility by: [place] Name of family member notified: daughter Patient and family notified of of transfer: 01/25/22  Discharge Plan and Services In-house Referral: Clinical Social Work                                   Social Determinants of Health (SDOH) Interventions Food Insecurity Interventions: Intervention Not Indicated Housing Interventions: Intervention Not Indicated   Readmission Risk Interventions     No data to display

## 2022-01-26 DIAGNOSIS — B3742 Candidal balanitis: Secondary | ICD-10-CM | POA: Diagnosis not present

## 2022-01-26 DIAGNOSIS — E782 Mixed hyperlipidemia: Secondary | ICD-10-CM | POA: Diagnosis not present

## 2022-01-26 DIAGNOSIS — R4189 Other symptoms and signs involving cognitive functions and awareness: Secondary | ICD-10-CM | POA: Diagnosis not present

## 2022-01-26 DIAGNOSIS — R296 Repeated falls: Secondary | ICD-10-CM | POA: Diagnosis not present

## 2022-01-26 DIAGNOSIS — I482 Chronic atrial fibrillation, unspecified: Secondary | ICD-10-CM | POA: Diagnosis not present

## 2022-01-26 DIAGNOSIS — K5900 Constipation, unspecified: Secondary | ICD-10-CM | POA: Diagnosis not present

## 2022-01-26 DIAGNOSIS — E119 Type 2 diabetes mellitus without complications: Secondary | ICD-10-CM | POA: Diagnosis not present

## 2022-01-26 DIAGNOSIS — K7460 Unspecified cirrhosis of liver: Secondary | ICD-10-CM | POA: Diagnosis not present

## 2022-01-27 DIAGNOSIS — I4891 Unspecified atrial fibrillation: Secondary | ICD-10-CM | POA: Diagnosis not present

## 2022-01-27 DIAGNOSIS — E1165 Type 2 diabetes mellitus with hyperglycemia: Secondary | ICD-10-CM | POA: Diagnosis not present

## 2022-01-27 DIAGNOSIS — K7460 Unspecified cirrhosis of liver: Secondary | ICD-10-CM | POA: Diagnosis not present

## 2022-01-30 ENCOUNTER — Other Ambulatory Visit: Payer: Self-pay | Admitting: *Deleted

## 2022-01-30 DIAGNOSIS — K7460 Unspecified cirrhosis of liver: Secondary | ICD-10-CM | POA: Diagnosis not present

## 2022-01-30 DIAGNOSIS — I482 Chronic atrial fibrillation, unspecified: Secondary | ICD-10-CM | POA: Diagnosis not present

## 2022-01-30 DIAGNOSIS — E119 Type 2 diabetes mellitus without complications: Secondary | ICD-10-CM | POA: Diagnosis not present

## 2022-01-30 DIAGNOSIS — D72819 Decreased white blood cell count, unspecified: Secondary | ICD-10-CM | POA: Diagnosis not present

## 2022-01-30 DIAGNOSIS — F411 Generalized anxiety disorder: Secondary | ICD-10-CM | POA: Diagnosis not present

## 2022-01-30 DIAGNOSIS — E782 Mixed hyperlipidemia: Secondary | ICD-10-CM | POA: Diagnosis not present

## 2022-01-30 NOTE — Patient Outreach (Signed)
Per [place] Mr. Huser resides in [facility] SNF. Screening for potential [facility] care coordination services as benefit of insurance plan.  Secure communication sent to [place], SNF social worker to inquire about transition plans and potential [facility] needs.  Will continue to follow.   [name], MSN, RN,BSN [place] Acute Care Coordinator [phone] (Direct dial)

## 2022-01-31 DIAGNOSIS — I5030 Unspecified diastolic (congestive) heart failure: Secondary | ICD-10-CM | POA: Diagnosis not present

## 2022-01-31 DIAGNOSIS — G4730 Sleep apnea, unspecified: Secondary | ICD-10-CM | POA: Diagnosis not present

## 2022-01-31 DIAGNOSIS — K7460 Unspecified cirrhosis of liver: Secondary | ICD-10-CM | POA: Diagnosis not present

## 2022-01-31 DIAGNOSIS — F411 Generalized anxiety disorder: Secondary | ICD-10-CM | POA: Diagnosis not present

## 2022-01-31 DIAGNOSIS — D72819 Decreased white blood cell count, unspecified: Secondary | ICD-10-CM | POA: Diagnosis not present

## 2022-01-31 DIAGNOSIS — I482 Chronic atrial fibrillation, unspecified: Secondary | ICD-10-CM | POA: Diagnosis not present

## 2022-02-01 DIAGNOSIS — F411 Generalized anxiety disorder: Secondary | ICD-10-CM | POA: Diagnosis not present

## 2022-02-01 DIAGNOSIS — I482 Chronic atrial fibrillation, unspecified: Secondary | ICD-10-CM | POA: Diagnosis not present

## 2022-02-01 DIAGNOSIS — K7460 Unspecified cirrhosis of liver: Secondary | ICD-10-CM | POA: Diagnosis not present

## 2022-02-01 DIAGNOSIS — I5030 Unspecified diastolic (congestive) heart failure: Secondary | ICD-10-CM | POA: Diagnosis not present

## 2022-02-01 DIAGNOSIS — E119 Type 2 diabetes mellitus without complications: Secondary | ICD-10-CM | POA: Diagnosis not present

## 2022-02-01 NOTE — Progress Notes (Signed)
Internal Medicine Clinic Attending  I saw and evaluated the patient.  I personally confirmed the key portions of the history and exam documented by Dr. Liang and I reviewed pertinent patient test results.  The assessment, diagnosis, and plan were formulated together and I agree with the documentation in the resident's note.  

## 2022-02-07 DIAGNOSIS — K7460 Unspecified cirrhosis of liver: Secondary | ICD-10-CM | POA: Diagnosis not present

## 2022-02-07 DIAGNOSIS — F411 Generalized anxiety disorder: Secondary | ICD-10-CM | POA: Diagnosis not present

## 2022-02-07 DIAGNOSIS — I5030 Unspecified diastolic (congestive) heart failure: Secondary | ICD-10-CM | POA: Diagnosis not present

## 2022-02-07 DIAGNOSIS — K0889 Other specified disorders of teeth and supporting structures: Secondary | ICD-10-CM | POA: Diagnosis not present

## 2022-02-07 DIAGNOSIS — R4189 Other symptoms and signs involving cognitive functions and awareness: Secondary | ICD-10-CM | POA: Diagnosis not present

## 2022-02-14 DIAGNOSIS — F411 Generalized anxiety disorder: Secondary | ICD-10-CM | POA: Diagnosis not present

## 2022-02-14 DIAGNOSIS — I5030 Unspecified diastolic (congestive) heart failure: Secondary | ICD-10-CM | POA: Diagnosis not present

## 2022-02-14 DIAGNOSIS — K7460 Unspecified cirrhosis of liver: Secondary | ICD-10-CM | POA: Diagnosis not present

## 2022-02-14 DIAGNOSIS — I482 Chronic atrial fibrillation, unspecified: Secondary | ICD-10-CM | POA: Diagnosis not present

## 2022-02-20 DIAGNOSIS — F411 Generalized anxiety disorder: Secondary | ICD-10-CM | POA: Diagnosis not present

## 2022-02-20 DIAGNOSIS — K7460 Unspecified cirrhosis of liver: Secondary | ICD-10-CM | POA: Diagnosis not present

## 2022-02-20 DIAGNOSIS — R4189 Other symptoms and signs involving cognitive functions and awareness: Secondary | ICD-10-CM | POA: Diagnosis not present

## 2022-02-20 DIAGNOSIS — I5030 Unspecified diastolic (congestive) heart failure: Secondary | ICD-10-CM | POA: Diagnosis not present

## 2022-02-20 DIAGNOSIS — I482 Chronic atrial fibrillation, unspecified: Secondary | ICD-10-CM | POA: Diagnosis not present

## 2022-02-26 ENCOUNTER — Other Ambulatory Visit: Payer: Self-pay

## 2022-02-26 ENCOUNTER — Encounter (HOSPITAL_COMMUNITY): Payer: Self-pay | Admitting: Emergency Medicine

## 2022-02-26 ENCOUNTER — Emergency Department (HOSPITAL_COMMUNITY): Payer: Medicare Other

## 2022-02-26 ENCOUNTER — Inpatient Hospital Stay (HOSPITAL_COMMUNITY)
Admission: EM | Admit: 2022-02-26 | Discharge: 2022-03-03 | DRG: 291 | Disposition: A | Discharge status: Skilled Nursing Facility | Payer: Medicare Other | Source: Skilled Nursing Facility | Attending: Internal Medicine | Admitting: Internal Medicine

## 2022-02-26 DIAGNOSIS — Z823 Family history of stroke: Secondary | ICD-10-CM

## 2022-02-26 DIAGNOSIS — J918 Pleural effusion in other conditions classified elsewhere: Secondary | ICD-10-CM | POA: Diagnosis present

## 2022-02-26 DIAGNOSIS — Z888 Allergy status to other drugs, medicaments and biological substances status: Secondary | ICD-10-CM

## 2022-02-26 DIAGNOSIS — Z7984 Long term (current) use of oral hypoglycemic drugs: Secondary | ICD-10-CM

## 2022-02-26 DIAGNOSIS — J90 Pleural effusion, not elsewhere classified: Secondary | ICD-10-CM | POA: Diagnosis not present

## 2022-02-26 DIAGNOSIS — F32A Depression, unspecified: Secondary | ICD-10-CM | POA: Diagnosis present

## 2022-02-26 DIAGNOSIS — I5031 Acute diastolic (congestive) heart failure: Secondary | ICD-10-CM | POA: Diagnosis not present

## 2022-02-26 DIAGNOSIS — K7460 Unspecified cirrhosis of liver: Secondary | ICD-10-CM | POA: Diagnosis present

## 2022-02-26 DIAGNOSIS — I25118 Atherosclerotic heart disease of native coronary artery with other forms of angina pectoris: Secondary | ICD-10-CM | POA: Diagnosis not present

## 2022-02-26 DIAGNOSIS — I480 Paroxysmal atrial fibrillation: Secondary | ICD-10-CM | POA: Diagnosis not present

## 2022-02-26 DIAGNOSIS — R4189 Other symptoms and signs involving cognitive functions and awareness: Secondary | ICD-10-CM | POA: Diagnosis present

## 2022-02-26 DIAGNOSIS — N179 Acute kidney failure, unspecified: Secondary | ICD-10-CM | POA: Diagnosis present

## 2022-02-26 DIAGNOSIS — I509 Heart failure, unspecified: Secondary | ICD-10-CM | POA: Diagnosis not present

## 2022-02-26 DIAGNOSIS — I5020 Unspecified systolic (congestive) heart failure: Secondary | ICD-10-CM

## 2022-02-26 DIAGNOSIS — Z79899 Other long term (current) drug therapy: Secondary | ICD-10-CM | POA: Diagnosis not present

## 2022-02-26 DIAGNOSIS — E785 Hyperlipidemia, unspecified: Secondary | ICD-10-CM | POA: Diagnosis present

## 2022-02-26 DIAGNOSIS — I110 Hypertensive heart disease with heart failure: Principal | ICD-10-CM | POA: Diagnosis present

## 2022-02-26 DIAGNOSIS — B372 Candidiasis of skin and nail: Secondary | ICD-10-CM | POA: Diagnosis not present

## 2022-02-26 DIAGNOSIS — Z952 Presence of prosthetic heart valve: Secondary | ICD-10-CM | POA: Diagnosis not present

## 2022-02-26 DIAGNOSIS — I872 Venous insufficiency (chronic) (peripheral): Secondary | ICD-10-CM | POA: Diagnosis present

## 2022-02-26 DIAGNOSIS — Z1152 Encounter for screening for COVID-19: Secondary | ICD-10-CM | POA: Diagnosis not present

## 2022-02-26 DIAGNOSIS — F0393 Unspecified dementia, unspecified severity, with mood disturbance: Secondary | ICD-10-CM | POA: Diagnosis present

## 2022-02-26 DIAGNOSIS — Z951 Presence of aortocoronary bypass graft: Secondary | ICD-10-CM

## 2022-02-26 DIAGNOSIS — R091 Pleurisy: Secondary | ICD-10-CM | POA: Diagnosis not present

## 2022-02-26 DIAGNOSIS — I5021 Acute systolic (congestive) heart failure: Secondary | ICD-10-CM | POA: Diagnosis not present

## 2022-02-26 DIAGNOSIS — Z8616 Personal history of COVID-19: Secondary | ICD-10-CM

## 2022-02-26 DIAGNOSIS — D509 Iron deficiency anemia, unspecified: Secondary | ICD-10-CM | POA: Diagnosis present

## 2022-02-26 DIAGNOSIS — R600 Localized edema: Secondary | ICD-10-CM | POA: Diagnosis not present

## 2022-02-26 DIAGNOSIS — Z8673 Personal history of transient ischemic attack (TIA), and cerebral infarction without residual deficits: Secondary | ICD-10-CM

## 2022-02-26 DIAGNOSIS — R0609 Other forms of dyspnea: Secondary | ICD-10-CM | POA: Diagnosis not present

## 2022-02-26 DIAGNOSIS — E1142 Type 2 diabetes mellitus with diabetic polyneuropathy: Secondary | ICD-10-CM | POA: Diagnosis not present

## 2022-02-26 DIAGNOSIS — E1165 Type 2 diabetes mellitus with hyperglycemia: Secondary | ICD-10-CM | POA: Diagnosis present

## 2022-02-26 DIAGNOSIS — Z794 Long term (current) use of insulin: Secondary | ICD-10-CM | POA: Diagnosis not present

## 2022-02-26 DIAGNOSIS — M6281 Muscle weakness (generalized): Secondary | ICD-10-CM | POA: Diagnosis not present

## 2022-02-26 DIAGNOSIS — R531 Weakness: Secondary | ICD-10-CM | POA: Diagnosis not present

## 2022-02-26 DIAGNOSIS — Z801 Family history of malignant neoplasm of trachea, bronchus and lung: Secondary | ICD-10-CM

## 2022-02-26 DIAGNOSIS — D539 Nutritional anemia, unspecified: Secondary | ICD-10-CM | POA: Diagnosis present

## 2022-02-26 DIAGNOSIS — I2510 Atherosclerotic heart disease of native coronary artery without angina pectoris: Secondary | ICD-10-CM | POA: Diagnosis not present

## 2022-02-26 DIAGNOSIS — R293 Abnormal posture: Secondary | ICD-10-CM | POA: Diagnosis not present

## 2022-02-26 DIAGNOSIS — Z880 Allergy status to penicillin: Secondary | ICD-10-CM

## 2022-02-26 DIAGNOSIS — R0602 Shortness of breath: Secondary | ICD-10-CM | POA: Diagnosis not present

## 2022-02-26 DIAGNOSIS — I5033 Acute on chronic diastolic (congestive) heart failure: Secondary | ICD-10-CM | POA: Diagnosis present

## 2022-02-26 DIAGNOSIS — Z7901 Long term (current) use of anticoagulants: Secondary | ICD-10-CM

## 2022-02-26 DIAGNOSIS — Z66 Do not resuscitate: Secondary | ICD-10-CM | POA: Diagnosis present

## 2022-02-26 DIAGNOSIS — B3742 Candidal balanitis: Secondary | ICD-10-CM | POA: Diagnosis not present

## 2022-02-26 DIAGNOSIS — K219 Gastro-esophageal reflux disease without esophagitis: Secondary | ICD-10-CM | POA: Diagnosis present

## 2022-02-26 DIAGNOSIS — I25810 Atherosclerosis of coronary artery bypass graft(s) without angina pectoris: Secondary | ICD-10-CM | POA: Diagnosis present

## 2022-02-26 DIAGNOSIS — R7889 Finding of other specified substances, not normally found in blood: Secondary | ICD-10-CM | POA: Diagnosis not present

## 2022-02-26 DIAGNOSIS — A6002 Herpesviral infection of other male genital organs: Secondary | ICD-10-CM | POA: Diagnosis present

## 2022-02-26 DIAGNOSIS — Z833 Family history of diabetes mellitus: Secondary | ICD-10-CM

## 2022-02-26 DIAGNOSIS — Z743 Need for continuous supervision: Secondary | ICD-10-CM | POA: Diagnosis not present

## 2022-02-26 DIAGNOSIS — D72819 Decreased white blood cell count, unspecified: Secondary | ICD-10-CM | POA: Diagnosis present

## 2022-02-26 DIAGNOSIS — R609 Edema, unspecified: Secondary | ICD-10-CM | POA: Diagnosis not present

## 2022-02-26 DIAGNOSIS — R1110 Vomiting, unspecified: Secondary | ICD-10-CM | POA: Diagnosis not present

## 2022-02-26 DIAGNOSIS — R0600 Dyspnea, unspecified: Secondary | ICD-10-CM | POA: Diagnosis not present

## 2022-02-26 DIAGNOSIS — L309 Dermatitis, unspecified: Secondary | ICD-10-CM | POA: Diagnosis present

## 2022-02-26 DIAGNOSIS — J811 Chronic pulmonary edema: Secondary | ICD-10-CM | POA: Diagnosis not present

## 2022-02-26 DIAGNOSIS — K7469 Other cirrhosis of liver: Secondary | ICD-10-CM | POA: Diagnosis not present

## 2022-02-26 DIAGNOSIS — R846 Abnormal cytological findings in specimens from respiratory organs and thorax: Secondary | ICD-10-CM | POA: Diagnosis not present

## 2022-02-26 DIAGNOSIS — Z885 Allergy status to narcotic agent status: Secondary | ICD-10-CM

## 2022-02-26 DIAGNOSIS — I878 Other specified disorders of veins: Secondary | ICD-10-CM | POA: Diagnosis present

## 2022-02-26 DIAGNOSIS — R2689 Other abnormalities of gait and mobility: Secondary | ICD-10-CM | POA: Diagnosis not present

## 2022-02-26 DIAGNOSIS — Z7401 Bed confinement status: Secondary | ICD-10-CM | POA: Diagnosis not present

## 2022-02-26 LAB — CBC WITH DIFFERENTIAL/PLATELET
Abs Immature Granulocytes: 0.02 10*3/uL (ref 0.00–0.07)
Basophils Absolute: 0 10*3/uL (ref 0.0–0.1)
Basophils Relative: 0 %
Eosinophils Absolute: 0.1 10*3/uL (ref 0.0–0.5)
Eosinophils Relative: 2 %
HCT: 29.7 % — ABNORMAL LOW (ref 39.0–52.0)
Hemoglobin: 9.5 g/dL — ABNORMAL LOW (ref 13.0–17.0)
Immature Granulocytes: 1 %
Lymphocytes Relative: 34 %
Lymphs Abs: 0.9 10*3/uL (ref 0.7–4.0)
MCH: 32.1 pg (ref 26.0–34.0)
MCHC: 32 g/dL (ref 30.0–36.0)
MCV: 100.3 fL — ABNORMAL HIGH (ref 80.0–100.0)
Monocytes Absolute: 0.1 10*3/uL (ref 0.1–1.0)
Monocytes Relative: 4 %
Neutro Abs: 1.6 10*3/uL — ABNORMAL LOW (ref 1.7–7.7)
Neutrophils Relative %: 59 %
Platelets: 223 10*3/uL (ref 150–400)
RBC: 2.96 MIL/uL — ABNORMAL LOW (ref 4.22–5.81)
RDW: 18.8 % — ABNORMAL HIGH (ref 11.5–15.5)
WBC: 2.7 10*3/uL — ABNORMAL LOW (ref 4.0–10.5)
nRBC: 0 % (ref 0.0–0.2)

## 2022-02-26 LAB — COMPREHENSIVE METABOLIC PANEL
ALT: 13 U/L (ref 0–44)
AST: 22 U/L (ref 15–41)
Albumin: 3.4 g/dL — ABNORMAL LOW (ref 3.5–5.0)
Alkaline Phosphatase: 64 U/L (ref 38–126)
Anion gap: 13 (ref 5–15)
BUN: 12 mg/dL (ref 8–23)
CO2: 22 mmol/L (ref 22–32)
Calcium: 8.6 mg/dL — ABNORMAL LOW (ref 8.9–10.3)
Chloride: 102 mmol/L (ref 98–111)
Creatinine, Ser: 1.02 mg/dL (ref 0.61–1.24)
GFR, Estimated: >60 mL/min (ref >60)
Glucose, Bld: 150 mg/dL — ABNORMAL HIGH (ref 70–99)
Potassium: 3.5 mmol/L (ref 3.5–5.1)
Sodium: 137 mmol/L (ref 135–145)
Total Bilirubin: 0.8 mg/dL (ref 0.3–1.2)
Total Protein: 6.7 g/dL (ref 6.5–8.1)

## 2022-02-26 LAB — RESP PANEL BY RT-PCR (RSV, FLU A&B, COVID)  RVPGX2
Influenza A by PCR: NEGATIVE
Influenza B by PCR: NEGATIVE
Resp Syncytial Virus by PCR: NEGATIVE
SARS Coronavirus 2 by RT PCR: NEGATIVE

## 2022-02-26 LAB — CBG MONITORING, ED: Glucose-Capillary: 287 mg/dL — ABNORMAL HIGH (ref 70–99)

## 2022-02-26 LAB — TROPONIN I (HIGH SENSITIVITY)
Troponin I (High Sensitivity): 40 ng/L — ABNORMAL HIGH (ref <18)
Troponin I (High Sensitivity): 45 ng/L — ABNORMAL HIGH (ref <18)

## 2022-02-26 LAB — BRAIN NATRIURETIC PEPTIDE: B Natriuretic Peptide: 373.4 pg/mL — ABNORMAL HIGH (ref 0.0–100.0)

## 2022-02-26 MED ORDER — LORAZEPAM 2 MG/ML IJ SOLN
1.0000 mg | Freq: Once | INTRAMUSCULAR | Status: DC
  Filled 2022-02-26: qty 1

## 2022-02-26 MED ORDER — ACETAMINOPHEN 325 MG PO TABS
650.0000 mg | ORAL_TABLET | Freq: Four times a day (QID) | ORAL | Status: DC | PRN
  Administered 2022-02-26 – 2022-03-03 (×8): 650 mg via ORAL
  Filled 2022-02-26 (×8): qty 2

## 2022-02-26 MED ORDER — CEPHALEXIN 250 MG PO CAPS
500.0000 mg | ORAL_CAPSULE | Freq: Four times a day (QID) | ORAL | Status: DC
  Administered 2022-02-26 – 2022-02-28 (×5): 500 mg via ORAL
  Filled 2022-02-26 (×5): qty 2

## 2022-02-26 MED ORDER — ESCITALOPRAM OXALATE 10 MG PO TABS
10.0000 mg | ORAL_TABLET | Freq: Every day | ORAL | Status: DC
  Administered 2022-02-27 – 2022-03-03 (×5): 10 mg via ORAL
  Filled 2022-02-26 (×5): qty 1

## 2022-02-26 MED ORDER — RIVAROXABAN 10 MG PO TABS: 10.0000 mg | ORAL_TABLET | Freq: Every day | ORAL | Status: DC

## 2022-02-26 MED ORDER — POLYETHYLENE GLYCOL 3350 17 G PO PACK: 17.0000 g | PACK | Freq: Every day | ORAL | Status: DC

## 2022-02-26 MED ORDER — VITAMIN B-12 100 MCG PO TABS
100.0000 ug | ORAL_TABLET | Freq: Every day | ORAL | Status: DC
  Administered 2022-02-26 – 2022-03-03 (×6): 100 ug via ORAL
  Filled 2022-02-26 (×6): qty 1

## 2022-02-26 MED ORDER — APIXABAN 5 MG PO TABS
5.0000 mg | ORAL_TABLET | Freq: Two times a day (BID) | ORAL | Status: DC
  Administered 2022-02-26 – 2022-03-03 (×10): 5 mg via ORAL
  Filled 2022-02-26 (×10): qty 1

## 2022-02-26 MED ORDER — FUROSEMIDE 10 MG/ML IJ SOLN
60.0000 mg | Freq: Once | INTRAMUSCULAR | Status: AC
  Administered 2022-02-26: 60 mg via INTRAVENOUS
  Filled 2022-02-26: qty 6

## 2022-02-26 MED ORDER — POLYETHYLENE GLYCOL 3350 17 G PO PACK: 17.0000 g | PACK | Freq: Every day | ORAL | Status: DC | PRN

## 2022-02-26 MED ORDER — INSULIN ASPART 100 UNIT/ML IJ SOLN
0.0000 [IU] | Freq: Three times a day (TID) | INTRAMUSCULAR | Status: DC
  Administered 2022-02-27: 5 [IU] via SUBCUTANEOUS
  Administered 2022-02-27: 11 [IU] via SUBCUTANEOUS
  Administered 2022-02-28: 5 [IU] via SUBCUTANEOUS
  Administered 2022-02-28: 3 [IU] via SUBCUTANEOUS
  Administered 2022-02-28 – 2022-03-01 (×2): 2 [IU] via SUBCUTANEOUS
  Administered 2022-03-01 – 2022-03-02 (×2): 5 [IU] via SUBCUTANEOUS
  Administered 2022-03-02: 3 [IU] via SUBCUTANEOUS

## 2022-02-26 MED ORDER — POTASSIUM CHLORIDE CRYS ER 20 MEQ PO TBCR
20.0000 meq | EXTENDED_RELEASE_TABLET | Freq: Once | ORAL | Status: AC
  Administered 2022-02-26: 20 meq via ORAL
  Filled 2022-02-26: qty 1

## 2022-02-26 MED ORDER — LORAZEPAM 2 MG/ML IJ SOLN
1.0000 mg | Freq: Once | INTRAMUSCULAR | Status: AC
  Administered 2022-02-26: 1 mg via INTRAVENOUS

## 2022-02-26 NOTE — Hospital Course (Signed)
Improved SOB with laying flat

## 2022-02-26 NOTE — ED Provider Notes (Signed)
[facility] EMERGENCY DEPARTMENT Provider Note   CSN: [id number] Arrival date & time: 02/26/22  1215     History Chief Complaint  Patient presents with   abnormal labs    Mark Cabrera is a 71 y.o. male with history of diabetes, dyslipidemia, hypertension, diastolic heart failure, CAD, CABG, AVR, dementia, CVA presents the emergency room today for evaluation of shortness of breath with an elevated BNP.  Patient arrives via [facility] via [facility] for a BMP that was drawn yesterday and was elevated at 1010.  Patient complains of shortness of breath and swelling to his extremities.  EMS placed patient on 2 L via nasal cannula although the patient was not hypoxic.  He denies any chest pain or palpitations.  He reports that his legs have been swollen for the past few days.  He reports that shortness of breath has been present also for the past few days.  He is a difficult historian.  HPI     Home Medications Prior to Admission medications   Medication Sig Start Date End Date Taking? Authorizing Provider  Accu-Chek Softclix Lancets lancets Use as instructed 12/03/20   Maudie Mercury, MD  acetaminophen (TYLENOL) 325 MG tablet Take 2 tablets (650 mg total) by mouth every 4 (four) hours as needed for headache or mild pain. 04/28/16   Isaiah Serge, NP  apixaban (ELIQUIS) 5 MG TABS tablet Take 1 tablet by mouth twice daily Patient not taking: Reported on 01/17/2022 12/08/20   Burnell Blanks, MD  escitalopram (LEXAPRO) 10 MG tablet Take 1 tablet (10 mg total) by mouth daily. Patient not taking: Reported on 01/17/2022 12/02/20   Mitzi Hansen, MD  glucose blood (ACCU-CHEK GUIDE) test strip Use as instructed 12/03/20   Maudie Mercury, MD  hydrOXYzine (ATARAX) 10 MG/5ML syrup Take 5 mLs (10 mg total) by mouth 3 (three) times daily as needed for anxiety. Patient not taking: Reported on 01/17/2022 09/28/21   Romana Juniper, MD  insulin aspart  (NOVOLOG FLEXPEN) 100 UNIT/ML FlexPen INJECT 12 UNITS UNDER THE SKIN THREE TIMES DAILY WITH MEALS 01/25/22   Linward Natal, MD  Insulin Pen Needle 31G X 5 MM MISC Use one pen needle three times daily. Dx E11.49 09/14/21   [name], MD  metFORMIN (GLUCOPHAGE) 500 MG tablet Take 1 tablet (500 mg total) by mouth 2 (two) times daily with a meal. Patient not taking: Reported on 01/17/2022 12/30/20 01/17/22  Lacinda Axon, MD  metoprolol succinate (TOPROL-XL) 25 MG 24 hr tablet Take 0.5 tablets (12.5 mg total) by mouth daily. 01/26/22 04/26/22  Linward Natal, MD  polyethylene glycol (MIRALAX / GLYCOLAX) 17 g packet Take 17 g by mouth daily. Patient not taking: Reported on 01/17/2022 09/29/21   Romana Juniper, MD  REPATHA SURECLICK 591 MG/ML SOAJ INJECT 1 PEN INTO THE SKIN EVERY 14 (FOURTEEN) DAYS. Patient not taking: Reported on 01/17/2022 06/21/20   Burnell Blanks, MD  SOLIQUA 100-33 UNT-MCG/ML SOPN Inject 40 Units into the skin daily. Patient not taking: Reported on 01/17/2022 12/03/20   Maudie Mercury, MD  vitamin B-12 (CYANOCOBALAMIN) 100 MCG tablet Take 100 mcg by mouth daily. Patient not taking: Reported on 09/21/2021    [provider]      Allergies    Morpholine salicylate, Penicillins, Morphine and related, Sglt2 inhibitors, and Statins    Review of Systems   Review of Systems  Unable to perform ROS: Dementia  Respiratory:  Positive for shortness of breath.  Cardiovascular:  Positive for leg swelling. Negative for chest pain and palpitations.    Physical Exam Updated Vital Signs BP 108/84   Pulse (!) 106   Temp 98 F (36.7 C)   Resp 20   SpO2 99%  Physical Exam Vitals and nursing note reviewed.  Constitutional:      Comments: Appears older than stated age  HENT:     Head: Normocephalic and atraumatic.  Eyes:     General: No scleral icterus. Cardiovascular:     Rate and Rhythm: Normal rate. Rhythm irregular.  Pulmonary:     Effort:  Pulmonary effort is normal. No respiratory distress.  Abdominal:     General: Bowel sounds are normal. There is no distension.     Palpations: Abdomen is soft.     Tenderness: There is no abdominal tenderness. There is no guarding or rebound.  Musculoskeletal:        General: No deformity.     Cervical back: Normal range of motion.     Right lower leg: Edema present.     Left lower leg: Edema present.     Comments: 2+ pitting edema to bilateral lower extremities  Skin:    General: Skin is warm and dry.  Neurological:     General: No focal deficit present.     Mental Status: He is alert. Mental status is at baseline.     ED Results / Procedures / Treatments   Labs (all labs ordered are listed, but only abnormal results are displayed) Labs Reviewed  COMPREHENSIVE METABOLIC PANEL - Abnormal; Notable for the following components:      Result Value   Glucose, Bld 150 (*)    Calcium 8.6 (*)    Albumin 3.4 (*)    All other components within normal limits  CBC WITH DIFFERENTIAL/PLATELET - Abnormal; Notable for the following components:   WBC 2.7 (*)    RBC 2.96 (*)    Hemoglobin 9.5 (*)    HCT 29.7 (*)    MCV 100.3 (*)    RDW 18.8 (*)    Neutro Abs 1.6 (*)    All other components within normal limits  RESP PANEL BY RT-PCR (RSV, FLU A&B, COVID)  RVPGX2  BRAIN NATRIURETIC PEPTIDE  TROPONIN I (HIGH SENSITIVITY)    EKG None  Radiology DG Chest 2 View  Result Date: 02/26/2022 CLINICAL DATA:  Shortness of breath. EXAM: CHEST - 2 VIEW COMPARISON:  01/17/2022 FINDINGS: Heart size is within normal limits. Prior CABG and aortic valve replacement again noted. Increased diffuse interstitial infiltrates are suspicious for mild interstitial edema. New small to moderate left pleural effusion and left lower lobe atelectasis or infiltrate are also seen. IMPRESSION: Increased diffuse interstitial infiltrates, suspicious for mild interstitial edema. New small to moderate left pleural effusion,  and left lower lobe atelectasis or infiltrate. Electronically Signed   By: Marlaine Hind M.D.   On: 02/26/2022 13:26    Procedures Procedures   Medications Ordered in ED Medications  furosemide (LASIX) injection 60 mg (60 mg Intravenous Given 02/26/22 1430)  potassium chloride SA (KLOR-CON M) CR tablet 20 mEq (20 mEq Oral Given 02/26/22 1431)  LORazepam (ATIVAN) injection 1 mg (1 mg Intravenous Given 02/26/22 1431)    ED Course/ Medical Decision Making/ A&P Clinical Course as of 02/26/22 1526  Sun Feb 26, 2022  1339 I evaluated at bedside.  Obvious heart failure exacerbation.  Lower extremity swelling.  Past echo with diastolic heart dysfunction.  X-ray with interstitial infiltrates.  Will check electrolytes, start IV diuresis and admit for further care and management. [CC]  2778 EUMPN with Lattie Haw with lab on the status of the BNP as it has not resulted yet. She states that she will take care of it.  [RR]    Clinical Course User Index [CC] Tretha Sciara, MD [RR] Sherrell Puller, PA-C                           Medical Decision Making Risk Prescription drug management. Decision regarding hospitalization.   71 year old male presents the emergency room today for evaluation of shortness of breath of the past few days as well as lower leg swelling.  Patient was sent over here from a nursing home for a BNP value of 1010.  Differential diagnosis includes but is not limited to venous congestion, CHF, pneumonia, pneumothorax, ACS, pneumonia, bronchitis, anxiety.  Vital signs show blood pressure at 125/73, normal pulse rate, satting well on room air without increased work of breathing.  Patient was initially brought on 2 L nasal cannula.  I turned this off in the room and continually monitor the patient while in the room.  His saturation did not drop.  I do not think this patient needs to be on any oxygen it is not necessary.  Labs and imaging ordered.  Patient becoming irate with staff  threatening to punch and hit he is being verbally and physically aggressive to staff because he reports that he feels short of breath and demands to be put on oxygen.  I went to the room to explain to the patient the dangers of giving him oxygen but he does not need it.  He demands that I get out of his room.  Patient's daughter was in the room who is the patient's legal guardian.  She would like the patient to still be admitted and reports that he needed something to calm his anxieties last time.  Patient was given Ativan and is now well-appearing and comfortable.  I independently reviewed and interpret the patient's labs CMP shows elevated glucose at 150.  Mildly decreased calcium and albumin otherwise unremarkable.  CBC shows chronic leukopenia with 2.7.  Hemoglobin 9.5 appears to be around patient's baseline.  Normal platelets.  He is negative for COVID, flu, and RSV.  His BNP is elevated at 373 which is high as has been with our records however is decreased from the reported BNP of 1010.  His initial troponin is elevated at 40, delta troponin pending at this time.  CXR shows increased diffuse interstitial infiltrates, suspicious for mild interstitial edema. New small to moderate left pleural effusion, and left lower lobe atelectasis or infiltrate.   Given his edematous legs and x-ray findings such as for pleural effusion, will give him some IV Lasix.  My attending assessed at bedside and agrees with this plan.  He would like admission.  Patient was given Ativan, potassium, and Lasix while in the emergency department.  He will be admitted to internal medicine teaching service.  I discussed this case with my attending physician who cosigned this note including patient's presenting symptoms, physical exam, and planned diagnostics and interventions. Attending physician stated agreement with plan or made changes to plan which were implemented.   Attending physician assessed patient at  bedside.   Final Clinical Impression(s) / ED Diagnoses Final diagnoses:  Acute on chronic congestive heart failure, unspecified heart failure type [facility] [place] [facility])    Rx / DC Orders ED Discharge  Orders     None         Sherrell Puller, [place] 02/26/22 1815    Tretha Sciara, MD 02/27/22 [phone]

## 2022-02-26 NOTE — ED Notes (Signed)
Pt found at doorway requesting oxygen. Pt assisted back to bed, placed on 2L for comfort, repositioned in the bed

## 2022-02-26 NOTE — ED Notes (Signed)
Meds given from schedule in the pyxis than saw after the fact that some of the meds were discontinued

## 2022-02-26 NOTE — ED Notes (Signed)
Pt verbally threatened to assault staff. Refused to let staff member take his vitals. Security at bedside.

## 2022-02-26 NOTE — ED Notes (Signed)
Pt given turkey sandwich

## 2022-02-26 NOTE — ED Provider Triage Note (Signed)
Emergency Medicine Provider Triage Evaluation Note  Mark Cabrera , a 71 y.o. male  was evaluated in triage.  Pt complains of abnormal labs.  Per EMS, patient had BMP of 1007 performed outpatient yesterday.  Was told to emergency department yesterday but patient refused.  Patient states he has been increasingly short of breath over the past 3 to 4 days.  History of heart failure and has noticed bilateral lower extremity swelling over the same amount of time.  Denies any chest pain, fever, cough, congestion..  Review of Systems  Positive: See above Negative:   Physical Exam  BP 125/73   Pulse 60   Temp 98 F (36.7 C)   Resp 19   SpO2 100%  Gen:   Awake, no distress   Resp:  Normal effort  MSK:   Moves extremities without difficulty  Other:  2-3+ bilateral lower extremity edema noted.  Erythematous bilateral lower extremities with right greater than left.  Possible rales auscultated bilateral lower lung fields.  Medical Decision Making  Medically screening exam initiated at 12:33 PM.  Appropriate orders placed.  [name] was informed that the remainder of the evaluation will be completed by another provider, this initial triage assessment does not replace that evaluation, and the importance of remaining in the ED until their evaluation is complete.     Wilnette Kales, [place] 02/26/22 [phone]

## 2022-02-26 NOTE — ED Triage Notes (Addendum)
Patient from [place] bib [facility] for a BNP that was drawn yesterday that was elevated at 1010 that was drawn yesterday. Patient w/ shob and swelling of the extremities. EMS placed on 2 L of O2 via [place]. Patient not hypoxic. Aox3

## 2022-02-26 NOTE — ED Notes (Signed)
Pt came out the room stated he is going to the bathroom. Kneeled down on the floor, did not hit his head. MD Countryman made aware.

## 2022-02-26 NOTE — ED Notes (Signed)
Pt very aggressive with staff, yelling and cursing because we wouldn't put him on oxygen. This tech explained to him that his oxygen level was 100% and that more oxygen would harm him, he told me I was lying and he would get out of the bed, walk down the hall and collapse then maybe somebody would help him and told me to get the f**k out of his room. I then went and got the PA and the DR and they both explained to him the same thing I told him regarding his oxygen. He began yelling and cursing at the nurse, security is now at bedside along with daughter. Pt will not allow any staff member to collect vitals or cooperate with any request at this time.

## 2022-02-26 NOTE — H&P (Cosign Needed Addendum)
Date: 02/26/2022               Patient Name:  Mark Cabrera MRN: [id number]  DOB: 21-Feb-1951 Age / Sex: 71 y.o., male   PCP: [name], MD         Medical Service: Internal Medicine Teaching Service         Attending Physician: Dorian Pod, MD    First Contact: Dr. Claudia Desanctis Mapp Pager: [phone]  Second Contact: Dr. Christiana Fuchs Pager: [phone]        After Hours (After 5p/  First Contact Pager: [phone]  weekends / holidays): Second Contact Pager: [phone]   Chief Complaint: SOB   History of Present Illness:   Mark Cabrera is a 72 y/o male with past medical history of T2DM, HLD, HTN, HFpEF (EF 50-55% in 09/2021), CAD s/p CABG, afib, AVR, dementia, compensated cirrhosis, and CVA that presents with worsening shortness of breath over the last few weeks.  Patient presents from [facility].  Patient states that his shortness of breath occurs at rest and does not worsen with exertion.  He denies having shortness of breath with lying flat and states that his dyspnea actually improves with lying down.  Patient also endorses intermittent diffuse chest pain over this period of time that occurs at rest and worsens with activity.  He states that his chest pain radiates to his neck and describes it as a pressure sensation.  Patient also endorses intermittent palpitations and worsening bilateral lower extremity swelling over the last 2 weeks.  Has also noticed worsening redness of his bilateral shins over the last few days.  Patient denies fever, chills, cough, nasal congestion, abdominal pain, nausea, vomiting, diarrhea, constipation, headache, syncope.  Patient's daughter was spoken to over the phone and reports that the patient was started on Lasix at [facility] for his shortness of breath and lower extremity swelling.  ED Course:  -CMP significant for mildly decreased albumin at 3.4 -CBC demonstrates leukopenia of 2.7 and macrocytic anemia with hemoglobin  of 9.5 -BNP elevated at 373 -RVP negative -Initial troponin elevated at 40 -Repeat troponin elevated at 45 -Chest x-ray significant for increased diffuse interstitial infiltrates suspicious for mild interstitial edema and new small-moderate left pleural effusion and left lower lobe atelectasis versus infiltrate -EKG demonstrates atrial fibrillation, LVH, age undetermined septal infarct, prolonged QT -1X IV Lasix 60 mg administered -Ativan 1 mg administered for agitation  Meds:  -Acetaminophen 325 mg (2 tablets q4 hours PRN) -Lasix 40 mg BID -Soliqua 40 units at bedtime -Repatha 140 mg/mL (inject every 14 days) -Novolog 12 units TID w/ meals -Metformin 500 mg BID -Vitamin B12 100 mcg daily -Escitalopram 10 mg daily -Metoprolol succinate 25 mg (1/2 tablet daily) -Miralax 17g daily -Duoneb 3 mL q6 hours PRN -Klor-Con M20 (2 tablet (40 mEq) daily) -Eliquis 5 mg tablet BID -Valacyclovir 1000 mg tablet BID -Orajel toothache-gum gel (apply topically up to 4X per day) -Hydroxyzine 10 mg/5 mL syrup (5 mL TID w/ meals PRN)  No outpatient medications have been marked as taking for the 02/26/22 encounter [facility] Encounter).     Allergies: Allergies as of 02/26/2022 - Review Complete 02/26/2022  Allergen Reaction Noted   Morpholine salicylate Other (See Comments) 10/16/2019   Penicillins Other (See Comments) 10/16/2019   Morphine and related Other (See Comments) 04/18/2016   Sglt2 inhibitors Rash and Other (See Comments) 04/12/2018   Statins Rash and Other (See Comments) 04/18/2016   Past Medical History:  Diagnosis Date  Acute diastolic congestive heart failure (HCC)    AKI (acute kidney injury) ([place])    Aortic valve stenosis s/p AVR 2018   Echo 2/22: Poor acoustic windows, EF 50-55, mild LVH, normal RVSF, mild MR, trivial AI, no AS, no pericardial effusion   Atrial fibrillation (HCC) - post-op CABG    04/2016 CHA2DS2VAS score = 5   Atypical nevi    Coronary artery disease s/p  2 vessel CABG    Depression    "years ago"   Diabetes mellitus    Dyspnea    in the past    GERD (gastroesophageal reflux disease)    Heart murmur    Hepatic cirrhosis ([place]) 06/29/2017   History of kidney stones    Hyperlipidemia    hx of transaminitis secondary to statin and he has decided not to use statins secondary to potential side effects.   Hypertension    Nephrolithiasis    Osteoarthritis, knee    Pericardial effusion a. subxiphoid pericardial window on 04/21/2016 04/28/2016   Peripheral neuropathic pain 05/12/2016   Sleep apnea     Central apnea. 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 2-D echo was normal no embolic source found.   Family History: Lung cancer (father), stroke/diabetes (mother)  Social History: Currently lives at [facility].  Patient is able to dress and bathe himself.  Patient is using a walker to ambulate at baseline.  Denies current or prior alcohol use.  Denies current or prior tobacco use.  Denies current or prior recreational drug use.  Review of Systems: A complete ROS was negative except as per HPI.   Physical Exam: Blood pressure (!) 120/103, pulse 97, temperature 97.6 F (36.4 C), temperature source Oral, resp. rate 16, SpO2 99 %. Constitutional: chronically ill-appearing, appears tired, resting in bed HENT: Normocephalic and atraumatic. Small scratches diffusely across the scalp without surrounding erythema, bleeding, or drainage.  Eyes: EOMI. PERRL.  Neck: Normal range of motion.  Cardiovascular: Regular rate, irregularly irregular rhythm. No murmurs, rubs, or gallops.  Normal radial and PT pulses bilaterally. 2+ bilateral LE edema.  Pulmonary: Normal respiratory effort. No wheezes, rales, or crackles. Transmitted upper airway sounds throughout all lung fields. Diminished lung sounds at bilateral bases.  Abdominal: Soft. Non-distended. Minimal LLQ abdominal tendernss. Normal bowel sounds.  Musculoskeletal: Normal range of motion.     Neurological: Alert and oriented to person, place, and time. Non-focal. Skin: warm and dry. Diffuse erythema of bilateral shins worse on the right, no bleeding or drainage, old healed scratches on bilateral shins, no jaundice  EKG: personally reviewed my interpretation is atrial fibrillation, LVH, age undetermined septal infarct, prolonged QT  CXR: personally reviewed my interpretation is diffuse interstitial infiltrates, small-moderate left pleural effusion, left lower lobe atelectasis versus infiltrate  Assessment & Plan by Problem: Principal Problem:   Acute exacerbation of CHF (congestive heart failure) (HCC)   Mark Cabrera is a 71 y/o male with past medical history of T2DM, HLD, HTN, HFpEF (EF 50-55% in 09/2021), CAD s/p CABG, afib, AVR, dementia, compensated cirrhosis, and CVA that presents with worsening shortness of breath and lower extremity swelling and admitted  for acute on chronic heart failure exacerbation.   #Acute CHF exacerbation #HFpEF #Chest pain #Dyspnea #LE swelling Patient presents with several weeks of worsening dyspnea and bilateral lower extremity swelling.  Patient was started on Lasix 40 mg BID at his facility for the symptoms without relief. Echo from 09/2021 demonstrates EF 50-55% with grade 2 diastolic dysfunction. On exam, patient has bilateral lower extremity swelling concerning for fluid overload.  Chest x-ray also significant for signs of fluid overload.  BNP elevated at 373. Low suspicion for pneumonia at this time given lack of fever, cough, or leukocytosis.  RVP negative.  Initial troponin  elevated at 40 with repeat troponin at 45.  No ST elevation or TWI on EKG.  Low suspicion for ACS at this time.  Suspect demand ischemia. Suspect his symptoms are likely secondary to fluid overload. Patient was given 1 mg IV Lasix 60 mg while in the ED.  Will hold off on further diuresis for today. -Tylenol for pain -F/u echo -Strict Is/Os  #Cellulitis Patient presented w/ worsening redness of his bilateral shins over the last few days. On exam, patient has diffuse erythema of bilateral shins worse on the right without no bleeding or drainage.  No fever or leukocytosis. Given his uncontrolled diabetes, patient is at increased risk for infection. -Keflex 500 mg q6   #H/o Cirrhosis CMP significant for mildly decreased albumin at 3.4. AST, ALT, and alkaline phosphatase WNL on admission. Can calculate MELD/Child Pugh scores once INR returns.  No concern for decompensated cirrhosis or hepatic encephalopathy at this time. -Monitor clinically -CMP -PT/INR  #CAD s/p CABG #Afib #CVA Patient takes metoprolol succinate 25 mg (1/2 tablet daily) and Eliquis 5 mg tablet BID at his facility. A-fib without RVR on EKG. Holding eliquis currently. Will hold Metoprolol at this time given low-normal BP.  -Eliquis 5 mg BID  #T2DM A1c > 14 1 month ago. Patient receives soliqua 40 units at bedtime, Novolog 12 units TID w/ meals, and Metformin 500 mg BID at his facility. CBG in the 100s on admission.  -SSI set to moderate -Semglee 20 units  #HTN On metoprolol succinate 25 mg 1/2 tablet daily at his facility. Otherwise no antihypertensive agents on file. Blood pressure low-normal on admission. Will hold Metoprolol at this time given low-normal BP.   #HLD Lipid panel from 03/2020 WNL w/ LDL of 68. Patient receives Repatha 140 mg/mL injection every 14 days at his facility. Holding Repatha currently. Will likely restart upon discharge.    #Chronic Leukopenia #Anemia CBC demonstrates leukopenia of 2.7 on  admission. Chronic but stable. Patient has microcytic anemia with hemoglobin of 9.5. He receives Vitamin B12 100 mcg daily at his facility.  -Vitamin B12 100 mcg daily -Trend CBC  #Dementia #Depression Patient is AxO x 4 here but had some bouts of aggression with staff while in the ED. Ativan 1 mg administered for agitation while in the ED. He receives Escitalopram 10 mg daily and Hydroxyzine 10 mg/5 mL syrup (5 mL TID w/ meals PRN) at his facility.  Will hold off on hydroxyzine at this time. 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  He was then treated with Diflucan and topical Lotrimin with resolution of his symptoms.  It appears that the patient is still taking Valacyclovir 1000 mg tablet BID at his facility.  Will need to reassess for symptoms and compliance with this medication. -Monitor clinically    Diet:  Carb-modified Bowel: Miralax VTE: Xarelto IVF: none Code: DNR   Prior to Admission Living Arrangement: [facility] Anticipated Discharge Location: TBD Barriers to Discharge: continued management  Dispo: Admit patient to Observation with expected length of stay less than 2 midnights.  Signed: Starlyn Skeans, MD 02/26/2022, 6:32 PM  Pager: [phone] After 5pm on weekdays and 1pm on weekends: On Call pager: [phone]

## 2022-02-27 ENCOUNTER — Observation Stay (HOSPITAL_COMMUNITY): Payer: Medicare Other

## 2022-02-27 DIAGNOSIS — I872 Venous insufficiency (chronic) (peripheral): Secondary | ICD-10-CM | POA: Diagnosis present

## 2022-02-27 DIAGNOSIS — J90 Pleural effusion, not elsewhere classified: Secondary | ICD-10-CM | POA: Diagnosis not present

## 2022-02-27 DIAGNOSIS — R0600 Dyspnea, unspecified: Secondary | ICD-10-CM

## 2022-02-27 DIAGNOSIS — I5031 Acute diastolic (congestive) heart failure: Secondary | ICD-10-CM | POA: Diagnosis not present

## 2022-02-27 LAB — COMPREHENSIVE METABOLIC PANEL
ALT: 12 U/L (ref 0–44)
AST: 19 U/L (ref 15–41)
Albumin: 3.6 g/dL (ref 3.5–5.0)
Alkaline Phosphatase: 62 U/L (ref 38–126)
Anion gap: 13 (ref 5–15)
BUN: 16 mg/dL (ref 8–23)
CO2: 25 mmol/L (ref 22–32)
Calcium: 8.8 mg/dL — ABNORMAL LOW (ref 8.9–10.3)
Chloride: 100 mmol/L (ref 98–111)
Creatinine, Ser: 1.27 mg/dL — ABNORMAL HIGH (ref 0.61–1.24)
GFR, Estimated: 60 mL/min — ABNORMAL LOW (ref >60)
Glucose, Bld: 226 mg/dL — ABNORMAL HIGH (ref 70–99)
Potassium: 3.4 mmol/L — ABNORMAL LOW (ref 3.5–5.1)
Sodium: 138 mmol/L (ref 135–145)
Total Bilirubin: 0.7 mg/dL (ref 0.3–1.2)
Total Protein: 7.2 g/dL (ref 6.5–8.1)

## 2022-02-27 LAB — GLUCOSE, CAPILLARY
Glucose-Capillary: 217 mg/dL — ABNORMAL HIGH (ref 70–99)
Glucose-Capillary: 179 mg/dL — ABNORMAL HIGH (ref 70–99)

## 2022-02-27 LAB — CBC
HCT: 31.1 % — ABNORMAL LOW (ref 39.0–52.0)
Hemoglobin: 9.9 g/dL — ABNORMAL LOW (ref 13.0–17.0)
MCH: 32.5 pg (ref 26.0–34.0)
MCHC: 31.8 g/dL (ref 30.0–36.0)
MCV: 102 fL — ABNORMAL HIGH (ref 80.0–100.0)
Platelets: 236 10*3/uL (ref 150–400)
RBC: 3.05 MIL/uL — ABNORMAL LOW (ref 4.22–5.81)
RDW: 18.8 % — ABNORMAL HIGH (ref 11.5–15.5)
WBC: 2.3 10*3/uL — ABNORMAL LOW (ref 4.0–10.5)
nRBC: 0 % (ref 0.0–0.2)

## 2022-02-27 LAB — PROTIME-INR
INR: 1.4 — ABNORMAL HIGH (ref 0.8–1.2)
Prothrombin Time: 17.5 seconds — ABNORMAL HIGH (ref 11.4–15.2)

## 2022-02-27 LAB — CBG MONITORING, ED
Glucose-Capillary: 219 mg/dL — ABNORMAL HIGH (ref 70–99)
Glucose-Capillary: 320 mg/dL — ABNORMAL HIGH (ref 70–99)
Glucose-Capillary: 160 mg/dL — ABNORMAL HIGH (ref 70–99)

## 2022-02-27 LAB — MAGNESIUM: Magnesium: 1.4 mg/dL — ABNORMAL LOW (ref 1.7–2.4)

## 2022-02-27 MED ORDER — POTASSIUM CHLORIDE CRYS ER 20 MEQ PO TBCR
40.0000 meq | EXTENDED_RELEASE_TABLET | Freq: Once | ORAL | Status: AC
  Administered 2022-02-27: 40 meq via ORAL
  Filled 2022-02-27: qty 2

## 2022-02-27 MED ORDER — FUROSEMIDE 40 MG PO TABS
40.0000 mg | ORAL_TABLET | Freq: Once | ORAL | Status: AC
  Administered 2022-02-27: 40 mg via ORAL
  Filled 2022-02-27: qty 1

## 2022-02-27 MED ORDER — QUETIAPINE FUMARATE 25 MG PO TABS
25.0000 mg | ORAL_TABLET | ORAL | Status: AC | PRN
  Administered 2022-02-27 (×2): 25 mg via ORAL
  Filled 2022-02-27 (×2): qty 1

## 2022-02-27 MED ORDER — FUROSEMIDE 40 MG PO TABS
40.0000 mg | ORAL_TABLET | Freq: Two times a day (BID) | ORAL | Status: DC
  Administered 2022-02-28 – 2022-03-03 (×7): 40 mg via ORAL
  Filled 2022-02-27 (×7): qty 1

## 2022-02-27 MED ORDER — MAGNESIUM SULFATE 2 GM/50ML IV SOLN
2.0000 g | Freq: Once | INTRAVENOUS | Status: AC
  Administered 2022-02-27: 2 g via INTRAVENOUS
  Filled 2022-02-27: qty 50

## 2022-02-27 MED ORDER — INSULIN GLARGINE-YFGN 100 UNIT/ML ~~LOC~~ SOLN: 20.0000 [IU] | Freq: Every day | SUBCUTANEOUS | Status: DC

## 2022-02-27 MED ORDER — INSULIN GLARGINE-YFGN 100 UNIT/ML ~~LOC~~ SOLN
20.0000 [IU] | Freq: Every day | SUBCUTANEOUS | Status: DC
  Administered 2022-02-27: 20 [IU] via SUBCUTANEOUS
  Filled 2022-02-27 (×5): qty 0.2

## 2022-02-27 NOTE — ED Notes (Signed)
The pt has pulled off all his lines again and has been replaced he has gone through about 10 pulse oxes and he has been warned to stop  he clearly understands

## 2022-02-27 NOTE — ED Notes (Signed)
Per conversation with pts daughter, pt has hx of dementia and anxiety and has episodes of wandering and forgetfulness. She states that his facility usually has to give anxiety medication to help with this. Notified MD of these findings.

## 2022-02-27 NOTE — Progress Notes (Addendum)
Subjective: Patient endorsing feeling overall better, not really sure why he is at [facility]. Denies CP or cough. Says he is having trouble breathing coming from his throat. Says his breathing is better when laying down, but then promptly gets up and says it is better while sitting up. Ultimately seems to go between the two. He says in the past they have had to turn his oxygen up for him to feel like he breathes well. He saturated 99% on RA while at bedside but did notice his dyspnea worsening and thinks this is from lack of feeling the oxygen. Endorses pleural effusions in the past that improved his feeling of dyspnea. He wants to have this one drained.Says he is using the bathroom well and had a BM today.  Objective:  Vital signs in last 24 hours: Vitals:   02/27/22 0310 02/27/22 0315 02/27/22 0901 02/27/22 1130  BP: (!) 98/56 (!) 132/90 130/74 130/85  Pulse: 93  (!) 59 84  Resp: 18 (!) '21 16 16  '$ Temp:   98.1 F (36.7 C)   TempSrc:   Oral   SpO2: 100% 100% 100% 100%   HENT: Normocephalic and atraumatic. Small scratches diffusely across the scalp without surrounding erythema, bleeding, or drainage.  Eyes: EOMI. PERRL.  Neck: Venous distention of vasculature in the head and neck worse when laying down,No lymphadenopathy or thyroid enlargement. Cardiovascular: Regular rate, irregularly irregular rhythm. No murmurs, rubs, or gallops. Normal radial and PT pulses bilaterally.  Pulmonary: Normal WOB, absent breath sounds at the L lower lung base, otherwise clear to auscultation bilaterally Abdominal: Soft, minimal LLQ tenderness, ND, normal active bowel sounds    Neurological: A&Ox4, attentive Skin: warm, dry, 2+ bilateral pitting edema, non-blanching erythema of the bilateral LEs with satellite lesions and without drainage or overt warmth  Assessment/Plan:  Principal Problem:   Acute exacerbation of CHF (congestive heart failure) (HCC)  Mark Cabrera is a 71 y/o male with past medical history  of T2DM, HLD, HTN, HFpEF (EF 50-55% in 09/2021), CAD s/p CABG, afib, AVR, dementia, compensated cirrhosis, and CVA that presents with worsening shortness of breath and lower extremity swelling and admitted for acute on chronic heart failure exacerbation.    Acute HFpEF exacerbationAS s/p AVRHx of pericardial effusion s/p pericardial window L pleural effusion Dyspnea Patient presents with several weeks of worsening dyspnea and bilateral lower extremity swelling. BNP at facility to >1000K and down to 373 on admission.  Patient was given 1 mg IV Lasix 60 mg while in the ED. Facility dose of Lasix  is 40 mg BID. Echo from 09/2021 demonstrates EF 50-55% with grade 2 diastolic dysfunction. On exam patient continues to have 2+ bilateral pitting edema of the shins. Satting 99% on RA but still having significant subjective dyspnea despite ability to lay flat in the bed. No crackles on lung exam but has absent lung sounds at the L lung base consistent with his L moderate pleural effusion new since prior imaging 11/7. His dyspnea out of proportion to the exam doesn't quite fit the picture for a simple heart failure exacerbation especially given his normal oxygen saturations.Given his distended neck veins and prior pericardial window will get echo to evaluate for failure and development of pericardial effusion. This will also evaluate for any aortic valve dysfunction as a cause for his exacerbation and dyspnea. Will additionally get a CT to evaluate the heart, vasculature and his known pulmonary nodule.  Did have creatinine increase from 1.02 to 1.27 this AM s/p lasix  and will continue to watch. Will start home lasix dosing and have consulted IR to drain the pleural effusion. -Lasix '40mg'$  BID oral -Tylenol for pain -F/u thoracentesis pleural fluid labs -Strict Is/Os -F/u echo -F/u CT chest   Cellulitis vs acute venous stasis dermatitis Patient presented w/ worsening redness of his bilateral shins over the last few  days. On exam, patient has diffuse erythema of bilateral shins worse on the right without no bleeding or drainage.  No fever or leukocytosis. -Keflex 500 mg q6    CAD s/p CABGAfibCVA A-fib without RVR on EKG.  Will hold Metoprolol at this time given low-normal BP.  -Eliquis 5 mg BID   T2DM A1c > 14 1 month ago. Patient receives soliqua 40 units at bedtime, Novolog 12 units TID w/ meals, and Metformin 500 mg BID at his facility. Given 11u novolog after breakfast this AM with glucose from 320 to 160. Will consolidate meal time coverage tomorrow. -SSI set to moderate -Semglee 20 units   HTN Holding metoprolol succinate 25 mg 1/2 tablet daily.   HLD  Holding Repatha currently. Will likely restart upon discharge.    Chronic LeukopeniaAnemia CBC demonstrates leukopenia of 2.7 on admission. Chronic but stable.  -Trend CBC   Cognitive impairment Depression A&O x4, patient appears to have decision making capacity for thoracentesis discussed with him today. -Escitalopram 10 mg daily for depression  -Hold ydroxyzine 10 mg/5 mL syrup (5 mL TID w/ meals PRN; anticholinergic, should be avoided particularly in cognitive impairment)  Prior to Admission Living Arrangement: Anticipated Discharge Location: Barriers to Discharge: Dispo: Anticipated discharge in approximately 1 day(s).   Iona Coach, MD 02/27/2022, 12:52 PM Pager: [phone] After 5pm on weekdays and 1pm on weekends: On Call pager [phone]

## 2022-02-27 NOTE — ED Notes (Signed)
Pt educated on importance of cardiac monitoring and pulse oximetry due to his continued complaint of SOB. Asked pt to press call light if he needs to get up for any reason and we will come unhook him from monitoring. Less than 5 minutes after this conversation, patient ripped off all leads and pulse ox sticker and walked to the front desk and then to the bathroom.

## 2022-02-27 NOTE — ED Notes (Signed)
Sitter orders put in, staffing called and charge nurse notified

## 2022-02-27 NOTE — Inpatient Diabetes Management (Signed)
Inpatient Diabetes Program Recommendations  AACE/ADA: New Consensus Statement on Inpatient Glycemic Control  Target Ranges:  Prepandial:   less than 140 mg/dL      Peak postprandial:   less than 180 mg/dL (1-2 hours)      Critically ill patients:  140 - 180 mg/dL    Latest Reference Range & Units 02/27/22 04:01 02/27/22 08:50  Glucose-Capillary 70 - 99 mg/dL 219 (H) 320 (H)    Latest Reference Range & Units 02/26/22 22:46  Glucose-Capillary 70 - 99 mg/dL 287 (H)    Latest Reference Range & Units 09/21/21 15:11 01/17/22 16:27  Hemoglobin A1C 4.0 - 5.6 % >14.0 >14.0   Review of Glycemic Control  Diabetes history: DM2 Outpatient Diabetes medications: Novolog 12 units TID with meals, Soliqua 100-33 40 units daily, Metformin 1000 mg BID Current orders for Inpatient glycemic control: Semglee 20 units QHS, Novolog 0-15 units TID with meals  Inpatient Diabetes Program Recommendations:    Insulin: CBG 320 mg/dl today at 8:50 am. No Semglee given last night. Please consider changing Semglee to 25 units daily (to start now), adding Novolog 0-5 units QHS, and Novolog 4 units TID with meals for meal coverage if patient eats at least 50% of meals.  HbgA1C:  A1C >14% on 01/17/22. Patient was inpatient 01/17/22-01/25/22 and seen by inpatient diabetes coordinator on 01/18/22.    Thanks, Barnie Alderman, RN, MSN, [place] [facility] Coordinator Inpatient Diabetes Program [phone] (Team Pager from 8am to [place])

## 2022-02-27 NOTE — ED Notes (Signed)
The pt has been insisting on nasal 02 not visibly sob  sats on room air 100%

## 2022-02-27 NOTE — ED Notes (Signed)
Pt removed his [place] O2 and is standing in doorway still on oxisensor. States, I can't breathe. SPO2 on RA is 99%. Returned to stretcher, and [place]. Attempted to reassure, orient, update, and encourage. Pt alert, NAD, anxious, speaking in clear complete sentences. Lying in stretcher supine, HOB 35 degrees.

## 2022-02-27 NOTE — ED Notes (Signed)
Pt wandering around room saying he cannot breathe, said his nasal cannula is not working. Educated patient on proper use of nasal cannula and showed him on oxygen flowmeter how he can tell that oxygen is in fact being delivered. Educated on pulse oximetry and what the numbers on the monitor mean, he is sating 100%, to help ease his mind about his breathing. Pt laying in bed trying to rest at this time

## 2022-02-27 NOTE — ED Notes (Signed)
The pt came out of his room saying he needed something to eat that his blood sugar was dropping  after his sugar was checked it was over 200 he was given a sandwich at his insistance

## 2022-02-27 NOTE — ED Notes (Signed)
Just woke up for the   10th time pulling off all his wires  every wire replaced again vitals checked

## 2022-02-28 ENCOUNTER — Inpatient Hospital Stay (HOSPITAL_COMMUNITY): Payer: Medicare Other

## 2022-02-28 DIAGNOSIS — I509 Heart failure, unspecified: Secondary | ICD-10-CM | POA: Diagnosis present

## 2022-02-28 DIAGNOSIS — M6281 Muscle weakness (generalized): Secondary | ICD-10-CM | POA: Diagnosis not present

## 2022-02-28 DIAGNOSIS — R600 Localized edema: Secondary | ICD-10-CM | POA: Diagnosis not present

## 2022-02-28 DIAGNOSIS — Z888 Allergy status to other drugs, medicaments and biological substances status: Secondary | ICD-10-CM | POA: Diagnosis not present

## 2022-02-28 DIAGNOSIS — F0393 Unspecified dementia, unspecified severity, with mood disturbance: Secondary | ICD-10-CM | POA: Diagnosis present

## 2022-02-28 DIAGNOSIS — I872 Venous insufficiency (chronic) (peripheral): Secondary | ICD-10-CM | POA: Diagnosis present

## 2022-02-28 DIAGNOSIS — Z66 Do not resuscitate: Secondary | ICD-10-CM | POA: Diagnosis present

## 2022-02-28 DIAGNOSIS — I5031 Acute diastolic (congestive) heart failure: Secondary | ICD-10-CM | POA: Diagnosis not present

## 2022-02-28 DIAGNOSIS — R531 Weakness: Secondary | ICD-10-CM | POA: Diagnosis not present

## 2022-02-28 DIAGNOSIS — F32A Depression, unspecified: Secondary | ICD-10-CM | POA: Diagnosis present

## 2022-02-28 DIAGNOSIS — R0600 Dyspnea, unspecified: Secondary | ICD-10-CM | POA: Diagnosis not present

## 2022-02-28 DIAGNOSIS — Z880 Allergy status to penicillin: Secondary | ICD-10-CM | POA: Diagnosis not present

## 2022-02-28 DIAGNOSIS — R091 Pleurisy: Secondary | ICD-10-CM | POA: Diagnosis not present

## 2022-02-28 DIAGNOSIS — I5020 Unspecified systolic (congestive) heart failure: Secondary | ICD-10-CM

## 2022-02-28 DIAGNOSIS — R1110 Vomiting, unspecified: Secondary | ICD-10-CM | POA: Diagnosis not present

## 2022-02-28 DIAGNOSIS — Z1152 Encounter for screening for COVID-19: Secondary | ICD-10-CM | POA: Diagnosis not present

## 2022-02-28 DIAGNOSIS — N179 Acute kidney failure, unspecified: Secondary | ICD-10-CM | POA: Diagnosis present

## 2022-02-28 DIAGNOSIS — Z794 Long term (current) use of insulin: Secondary | ICD-10-CM | POA: Diagnosis not present

## 2022-02-28 DIAGNOSIS — B372 Candidiasis of skin and nail: Secondary | ICD-10-CM | POA: Diagnosis not present

## 2022-02-28 DIAGNOSIS — I480 Paroxysmal atrial fibrillation: Secondary | ICD-10-CM | POA: Diagnosis present

## 2022-02-28 DIAGNOSIS — E1142 Type 2 diabetes mellitus with diabetic polyneuropathy: Secondary | ICD-10-CM | POA: Diagnosis not present

## 2022-02-28 DIAGNOSIS — I5033 Acute on chronic diastolic (congestive) heart failure: Secondary | ICD-10-CM | POA: Diagnosis present

## 2022-02-28 DIAGNOSIS — R0609 Other forms of dyspnea: Secondary | ICD-10-CM

## 2022-02-28 DIAGNOSIS — Z952 Presence of prosthetic heart valve: Secondary | ICD-10-CM | POA: Diagnosis not present

## 2022-02-28 DIAGNOSIS — E1165 Type 2 diabetes mellitus with hyperglycemia: Secondary | ICD-10-CM | POA: Diagnosis present

## 2022-02-28 DIAGNOSIS — K7460 Unspecified cirrhosis of liver: Secondary | ICD-10-CM | POA: Diagnosis present

## 2022-02-28 DIAGNOSIS — I2510 Atherosclerotic heart disease of native coronary artery without angina pectoris: Secondary | ICD-10-CM | POA: Diagnosis not present

## 2022-02-28 DIAGNOSIS — E785 Hyperlipidemia, unspecified: Secondary | ICD-10-CM | POA: Diagnosis present

## 2022-02-28 DIAGNOSIS — K7469 Other cirrhosis of liver: Secondary | ICD-10-CM | POA: Diagnosis not present

## 2022-02-28 DIAGNOSIS — D509 Iron deficiency anemia, unspecified: Secondary | ICD-10-CM | POA: Diagnosis present

## 2022-02-28 DIAGNOSIS — A6002 Herpesviral infection of other male genital organs: Secondary | ICD-10-CM | POA: Diagnosis present

## 2022-02-28 DIAGNOSIS — J918 Pleural effusion in other conditions classified elsewhere: Secondary | ICD-10-CM | POA: Diagnosis present

## 2022-02-28 DIAGNOSIS — R2689 Other abnormalities of gait and mobility: Secondary | ICD-10-CM | POA: Diagnosis not present

## 2022-02-28 DIAGNOSIS — I5021 Acute systolic (congestive) heart failure: Secondary | ICD-10-CM | POA: Diagnosis not present

## 2022-02-28 DIAGNOSIS — B3742 Candidal balanitis: Secondary | ICD-10-CM | POA: Diagnosis not present

## 2022-02-28 DIAGNOSIS — I25118 Atherosclerotic heart disease of native coronary artery with other forms of angina pectoris: Secondary | ICD-10-CM | POA: Diagnosis not present

## 2022-02-28 DIAGNOSIS — J90 Pleural effusion, not elsewhere classified: Secondary | ICD-10-CM | POA: Diagnosis not present

## 2022-02-28 DIAGNOSIS — D539 Nutritional anemia, unspecified: Secondary | ICD-10-CM | POA: Diagnosis present

## 2022-02-28 DIAGNOSIS — Z8616 Personal history of COVID-19: Secondary | ICD-10-CM | POA: Diagnosis not present

## 2022-02-28 DIAGNOSIS — I110 Hypertensive heart disease with heart failure: Secondary | ICD-10-CM | POA: Diagnosis present

## 2022-02-28 DIAGNOSIS — Z743 Need for continuous supervision: Secondary | ICD-10-CM | POA: Diagnosis not present

## 2022-02-28 DIAGNOSIS — Z79899 Other long term (current) drug therapy: Secondary | ICD-10-CM | POA: Diagnosis not present

## 2022-02-28 DIAGNOSIS — Z7401 Bed confinement status: Secondary | ICD-10-CM | POA: Diagnosis not present

## 2022-02-28 DIAGNOSIS — R293 Abnormal posture: Secondary | ICD-10-CM | POA: Diagnosis not present

## 2022-02-28 DIAGNOSIS — I25810 Atherosclerosis of coronary artery bypass graft(s) without angina pectoris: Secondary | ICD-10-CM | POA: Diagnosis present

## 2022-02-28 DIAGNOSIS — D72819 Decreased white blood cell count, unspecified: Secondary | ICD-10-CM | POA: Diagnosis present

## 2022-02-28 DIAGNOSIS — J811 Chronic pulmonary edema: Secondary | ICD-10-CM | POA: Diagnosis not present

## 2022-02-28 DIAGNOSIS — R846 Abnormal cytological findings in specimens from respiratory organs and thorax: Secondary | ICD-10-CM | POA: Diagnosis not present

## 2022-02-28 HISTORY — PX: IR THORACENTESIS ASP PLEURAL SPACE W/IMG GUIDE: IMG5380

## 2022-02-28 LAB — BASIC METABOLIC PANEL
Anion gap: 11 (ref 5–15)
BUN: 18 mg/dL (ref 8–23)
CO2: 24 mmol/L (ref 22–32)
Calcium: 8.6 mg/dL — ABNORMAL LOW (ref 8.9–10.3)
Chloride: 102 mmol/L (ref 98–111)
Creatinine, Ser: 1 mg/dL (ref 0.61–1.24)
GFR, Estimated: >60 mL/min (ref >60)
Glucose, Bld: 228 mg/dL — ABNORMAL HIGH (ref 70–99)
Potassium: 3.7 mmol/L (ref 3.5–5.1)
Sodium: 137 mmol/L (ref 135–145)

## 2022-02-28 LAB — BODY FLUID CELL COUNT WITH DIFFERENTIAL
Lymphs, Fluid: 84 %
Monocyte-Macrophage-Serous Fluid: 4 % — ABNORMAL LOW (ref 50–90)
Neutrophil Count, Fluid: 12 % (ref 0–25)
Total Nucleated Cell Count, Fluid: 200 cu mm (ref 0–1000)

## 2022-02-28 LAB — ECHOCARDIOGRAM COMPLETE
AV Mean grad: 10 mmHg
AV Peak grad: 16.5 mmHg
Ao pk vel: 2.03 m/s
Area-P 1/2: 4.29 cm2
Calc EF: 38.8 %
Height: 68 in
MV M vel: 4.72 m/s
MV Peak grad: 89.1 mmHg
Radius: 0.7 cm
S' Lateral: 4.7 cm
Single Plane A2C EF: 28.2 %
Single Plane A4C EF: 45.9 %
Weight: 3164.8 oz

## 2022-02-28 LAB — GLUCOSE, PLEURAL OR PERITONEAL FLUID: Glucose, Fluid: 200 mg/dL

## 2022-02-28 LAB — PROTEIN, PLEURAL OR PERITONEAL FLUID: Total protein, fluid: <3 g/dL

## 2022-02-28 LAB — LACTATE DEHYDROGENASE, PLEURAL OR PERITONEAL FLUID: LD, Fluid: 70 U/L — ABNORMAL HIGH (ref 3–23)

## 2022-02-28 LAB — GLUCOSE, CAPILLARY
Glucose-Capillary: 206 mg/dL — ABNORMAL HIGH (ref 70–99)
Glucose-Capillary: 137 mg/dL — ABNORMAL HIGH (ref 70–99)
Glucose-Capillary: 152 mg/dL — ABNORMAL HIGH (ref 70–99)
Glucose-Capillary: 250 mg/dL — ABNORMAL HIGH (ref 70–99)

## 2022-02-28 LAB — MAGNESIUM: Magnesium: 1.7 mg/dL (ref 1.7–2.4)

## 2022-02-28 LAB — GRAM STAIN: Gram Stain: NONE SEEN

## 2022-02-28 MED ORDER — INSULIN ASPART 100 UNIT/ML IJ SOLN
5.0000 [IU] | Freq: Three times a day (TID) | INTRAMUSCULAR | Status: DC
  Administered 2022-02-28 – 2022-03-03 (×9): 5 [IU] via SUBCUTANEOUS

## 2022-02-28 MED ORDER — LIDOCAINE HCL 1 % IJ SOLN
INTRAMUSCULAR | Status: AC
  Administered 2022-02-28: 10 mL
  Filled 2022-02-28: qty 20

## 2022-02-28 MED ORDER — POTASSIUM CHLORIDE 20 MEQ PO PACK
60.0000 meq | PACK | Freq: Once | ORAL | Status: AC
  Administered 2022-02-28: 60 meq via ORAL
  Filled 2022-02-28: qty 3

## 2022-02-28 MED ORDER — MAGNESIUM SULFATE 2 GM/50ML IV SOLN
2.0000 g | Freq: Once | INTRAVENOUS | Status: AC
  Administered 2022-02-28: 2 g via INTRAVENOUS
  Filled 2022-02-28: qty 50

## 2022-02-28 MED ORDER — INSULIN GLARGINE-YFGN 100 UNIT/ML ~~LOC~~ SOLN
25.0000 [IU] | Freq: Every day | SUBCUTANEOUS | Status: DC
  Administered 2022-02-28: 25 [IU] via SUBCUTANEOUS
  Filled 2022-02-28 (×2): qty 0.25

## 2022-02-28 MED ORDER — PERFLUTREN LIPID MICROSPHERE
1.0000 mL | INTRAVENOUS | Status: AC | PRN
  Administered 2022-02-28: 2 mL via INTRAVENOUS

## 2022-02-28 NOTE — Progress Notes (Signed)
Subjective: Patient continues to have trouble with his breathing and says it was worsened when his room was cleaned this AM. He says he can't figure out if it is better laying down or sitting up and goes between the two positions, although he does mention feeling some improvement in dyspnea when sat up by the team. He has been up to the bathroom and doesn't feel the SOB worsens with exertion. Denies cough or CP. Says he has a good appetite and is urinating and having good Bms. Does seem to have a bit of confusion about why he is in the hospital. He continues to express wanting thoracentesis to help with his breathing.  Objective:  Vital signs in last 24 hours: Vitals:   02/27/22 2015 02/28/22 0016 02/28/22 0144 02/28/22 0741  BP: 128/85  129/81 136/88  Pulse: 65  88 79  Resp: '20  18 20  '$ Temp: 97.6 F (36.4 C)  97.8 F (36.6 C) (!) 97.4 F (36.3 C)  TempSrc: Oral  Oral Oral  SpO2: 100%  100% 100%  Weight:  89.7 kg    Height:       Gen: Well developed, well nourishes, acute discomfort and restlessness  Neck: Venous distention of vasculature in the head and neck, including the internal and external jugular, apparent when laying down although not assessed with sitting,No lymphadenopathy or thyroid enlargement. Cardiovascular: Regular rate, irregularly irregular rhythm. No murmurs, rubs, or gallops. 2+ radial and PT pulses bilaterally.  Pulmonary: Normal WOB, absent breath sounds at the L lower lung base, otherwise clear to auscultation bilaterally Abdominal: Soft, minimal LLQ tenderness, ND, normal active bowel sounds    Neurological: A&Ox4, attentive Skin: warm, dry, 2+ bilateral pitting edema, non-blanching erythema of the bilateral LEs consistent with prior days, without warmth  Assessment/Plan:  Principal Problem:   Acute exacerbation of CHF (congestive heart failure) (HCC)  Mark Cabrera is a 71 y/o male with past medical history of T2DM, HLD, HTN, HFpEF (EF 50-55% in 09/2021), CAD  s/p CABG, afib, AVR, dementia, compensated cirrhosis, and CVA that presents with worsening shortness of breath and lower extremity swelling and admitted for acute on chronic heart failure exacerbation who is HDS but experiencing persistent profound dyspnea and awaiting thoracentesis    Acute HFrEF exacerbationAS s/p AVRHx of pericardial effusion s/p pericardial window L pleural effusion Dyspnea  Echo from 09/2021 demonstrates EF 50-55% with grade 2 diastolic dysfunction. Echo today with LVEF 30-35% and new apical septal segment and apex akinesis c/f ischemic cardiomyopathy in the setting of CAD s/p CABG. No evidence of aortic valve dysfunction and no evidence of significant pericardial effusion causing this. Also no evidence intrapulmonary cause per CT. On exam patient continues to have 2+ bilateral pitting edema of the shins. Satting 99% on RA but still having significant subjective dyspnea No crackles on lung exam but has absent lung sounds at the L lung base consistent with his L moderate pleural effusion new since prior imaging 11/7. Will consult cardiology for further evaluation of this patient. Still awaiting IR to perform thoracentesis. -Lasix '40mg'$  BID oral -Tylenol for pain -F/u thoracentesis pleural fluid labs -Strict Is/Os -F/u echo  Venous stasis dermatitis Patient presented w/ worsening redness of his bilateral shins over the last few days. No fever or leukocytosis. No warmth or streaking. Given bilateral nature in the setting of tense painful edematous legs and HFpEF exacerbation do not think this is cellulitis. This is venous stasis dermatitis and will no longer treat with antibiotics. -Discontinue Keflex  500 mg q6  -compression stockings   CAD s/p CABGAfibCVA A-fib without RVR on EKG.  Will hold Metoprolol at this time given low-normal BP.  -Eliquis 5 mg BID   T2DM A1c > 14 1 month ago. Patient receives soliqua 40 units at bedtime, Novolog 12 units TID w/ meals, and Metformin  500 mg BID at his facility. Fasting BG 206 this AM, increasing semglee. Given 16u mealtime yesterday and scheduled 5u TID with meals today. Will reevaluate need for additional scheduled mealtime insulin tomorrow. -SSI set to moderate -5u novalog TID -Semglee 20 units   HTN Holding metoprolol succinate 25 mg 1/2 tablet daily.   HLD  Holding Repatha currently. Will likely restart upon discharge.    Chronic LeukopeniaAnemia CBC demonstrates leukopenia of 2.7 on admission. Chronic but stable.  -Trend CBC   Cognitive impairment Depression A&O x4, patient appears to have decision making capacity for thoracentesis discussed with him today. -Escitalopram 10 mg daily for depression  -Hold ydroxyzine 10 mg/5 mL syrup (5 mL TID w/ meals PRN; anticholinergic, should be avoided particularly in cognitive impairment)  Prior to Admission Living Arrangement: Anticipated Discharge Location: Barriers to Discharge: Dispo: Anticipated discharge in approximately 1 day(s).   Iona Coach, MD 02/28/2022, 9:38 AM Pager: [phone] After 5pm on weekdays and 1pm on weekends: On Call pager [phone]

## 2022-02-28 NOTE — Progress Notes (Signed)
  Echocardiogram 2D Echocardiogram has been performed.  Mark Cabrera 02/28/2022, 12:07 PM

## 2022-02-28 NOTE — Consult Note (Signed)
   [facility] Inpatient Consult   02/28/2022  BERTIS HUSTEAD 05-08-1950 [id number]  [place] [facility] [ACO] Patient: Medicare ACO REACH  Primary Care Provider:  [name], MD [facility] is listed to provide the transition of care follow up   Patient was reviewed for barriers to care and post hospital follow up needs. Noted that patient is admitted from [facility].  Patient is not a good historian.  Noted to continue to recommend SNF for post hospital needs.  If the patient goes to a [facility] affiliated facility then, patient can be followed by [facility] PAC RN with traditional Medicare and approved Medicare Advantage plans.    Plan:   Notify [facility] RN who can follow for any known or needs for transitional care needs for returning to post facility care coordination needs to return to community.  For questions or referrals, please contact:   Natividad Brood, RN BSN [place]  [phone] business mobile phone Toll free office [phone]  *[place]  [phone] Fax number: [phone] [email or web address]

## 2022-02-28 NOTE — Progress Notes (Addendum)
Pt anxious and restless constantly gets out the bed , taking leads off and oxygen off. Pt states that he can't breathe. Pt  escorted back to bed and put 02 back on sats are 100% on 5LNC. RR-20. Bed alarm reset. Provider [place] aware, see new orders.

## 2022-02-28 NOTE — Plan of Care (Signed)

## 2022-02-28 NOTE — Progress Notes (Signed)
Bed alarm ringing out pt standing at bedside anxious and fully unclothed stating that he can't breathe. Pt escorted back to bed , o2 applied pts sats 99-100% on 5LNC. Second dose of seroquel given per order provider aware. Will continue to monitor , call bell within reach.

## 2022-02-28 NOTE — Progress Notes (Signed)
Pt resting comfortably sitter at bedside. Will continue to monitor , call bell within reach.

## 2022-03-01 ENCOUNTER — Inpatient Hospital Stay (HOSPITAL_COMMUNITY): Payer: Medicare Other

## 2022-03-01 DIAGNOSIS — I2510 Atherosclerotic heart disease of native coronary artery without angina pectoris: Secondary | ICD-10-CM

## 2022-03-01 DIAGNOSIS — R0600 Dyspnea, unspecified: Secondary | ICD-10-CM | POA: Diagnosis not present

## 2022-03-01 DIAGNOSIS — J90 Pleural effusion, not elsewhere classified: Secondary | ICD-10-CM | POA: Diagnosis not present

## 2022-03-01 DIAGNOSIS — R600 Localized edema: Secondary | ICD-10-CM | POA: Diagnosis not present

## 2022-03-01 DIAGNOSIS — I5021 Acute systolic (congestive) heart failure: Secondary | ICD-10-CM | POA: Diagnosis not present

## 2022-03-01 DIAGNOSIS — I5031 Acute diastolic (congestive) heart failure: Secondary | ICD-10-CM | POA: Diagnosis not present

## 2022-03-01 LAB — BASIC METABOLIC PANEL
Anion gap: 8 (ref 5–15)
BUN: 16 mg/dL (ref 8–23)
CO2: 27 mmol/L (ref 22–32)
Calcium: 8.5 mg/dL — ABNORMAL LOW (ref 8.9–10.3)
Chloride: 102 mmol/L (ref 98–111)
Creatinine, Ser: 0.95 mg/dL (ref 0.61–1.24)
GFR, Estimated: >60 mL/min (ref >60)
Glucose, Bld: 231 mg/dL — ABNORMAL HIGH (ref 70–99)
Potassium: 3.6 mmol/L (ref 3.5–5.1)
Sodium: 137 mmol/L (ref 135–145)

## 2022-03-01 LAB — GLUCOSE, CAPILLARY
Glucose-Capillary: 216 mg/dL — ABNORMAL HIGH (ref 70–99)
Glucose-Capillary: 124 mg/dL — ABNORMAL HIGH (ref 70–99)
Glucose-Capillary: 119 mg/dL — ABNORMAL HIGH (ref 70–99)
Glucose-Capillary: 232 mg/dL — ABNORMAL HIGH (ref 70–99)

## 2022-03-01 LAB — LACTATE DEHYDROGENASE: LDH: 183 U/L (ref 98–192)

## 2022-03-01 MED ORDER — FUROSEMIDE 10 MG/ML IJ SOLN
40.0000 mg | Freq: Once | INTRAMUSCULAR | Status: AC
  Administered 2022-03-01: 40 mg via INTRAVENOUS
  Filled 2022-03-01: qty 4

## 2022-03-01 MED ORDER — POTASSIUM CHLORIDE 20 MEQ PO PACK
40.0000 meq | PACK | Freq: Once | ORAL | Status: AC
  Administered 2022-03-01: 40 meq via ORAL
  Filled 2022-03-01: qty 2

## 2022-03-01 MED ORDER — SALINE SPRAY 0.65 % NA SOLN
1.0000 | NASAL | Status: DC | PRN
  Filled 2022-03-01: qty 44

## 2022-03-01 MED ORDER — HALOPERIDOL 1 MG PO TABS
2.0000 mg | ORAL_TABLET | Freq: Once | ORAL | Status: AC
  Administered 2022-03-01: 2 mg via ORAL
  Filled 2022-03-01: qty 2

## 2022-03-01 MED ORDER — INSULIN GLARGINE-YFGN 100 UNIT/ML ~~LOC~~ SOLN
28.0000 [IU] | Freq: Every day | SUBCUTANEOUS | Status: DC
  Administered 2022-03-01 – 2022-03-02 (×2): 28 [IU] via SUBCUTANEOUS
  Filled 2022-03-01 (×3): qty 0.28

## 2022-03-01 MED ORDER — METOPROLOL SUCCINATE ER 25 MG PO TB24
12.5000 mg | ORAL_TABLET | Freq: Every day | ORAL | Status: DC
  Administered 2022-03-01 – 2022-03-03 (×3): 12.5 mg via ORAL
  Filled 2022-03-01 (×3): qty 1

## 2022-03-01 MED ORDER — SPIRONOLACTONE 12.5 MG HALF TABLET
12.5000 mg | ORAL_TABLET | Freq: Every day | ORAL | Status: DC
  Administered 2022-03-01 – 2022-03-03 (×3): 12.5 mg via ORAL
  Filled 2022-03-01 (×3): qty 1

## 2022-03-01 NOTE — Consult Note (Addendum)
Cardiology Consultation   Patient ID: Mark Cabrera MRN: [id number]; DOB: [birth year]  Admit date: 02/26/2022 Date of Consult: 03/01/2022  PCP:  [name], [place] [facility] Cardiologist:  Lauree Chandler, MD     Patient Profile:   Mark Cabrera is a 71 y.o. male with a hx of CAD s/p CABG (2018), severe AS s/p AVR 2018, HTN, HLD, paroxysmal atrial fibrillation, history of CVA who is being seen 03/01/2022 for the evaluation of CHF at the request of Dr. Jimmye Norman.  History of Present Illness:   Mark Cabrera is a 71 year old male with above medical history who is followed by Dr. [name]. Per chart review, patient was admitted in 03/2016 and had a R/L Heart catheterization on 03/16/2016 that showed severe aortic valve stenosis, severe stenosis in the distal RCA, moderate stenosis in the proximal circumflex and mid LAD. Underwent surgical aortic valve replacement, clipping of the  LAA, and CABG x2 with SVG-PDA and SVG-OM1 on 03/30/16. Post-op, patient developed atrial fibrillation and volume overload. Also developed a large pericardial effusion and plural effusion. He was readmitted and underwent thoracentesis and pericardial window on 04/21/2016. He continued to have ongoing chest pain after surgery, and sternal wire was removed in 08/2017.   Patient had a CVA in 06/2018, echocardiogram on 06/29/2018 showed EF 50-55%, severely increased LV wall thickness and grade II diastolic dysfunction, severely dilated LA, mild AS. He was admitted to [facility] in 12/2018, underwent cardiac catheterization on 01/10/19 with occlusion of the SVG-OM, severe stenosis of the SVG-PDA, and chronic total occlusion of the LAD. A DES was placed in the mid LAD, and another DES was placed in the SVG-PDA.   Patient was last seen by cardiology on 05/03/2020. He mentioned having an episode of chest pain that was thought to be GI in origin. He was not on ASA as he was on eliquis for afib.  Most recent echocardiogram was completed on 09/22/21 and showed EF 50-55%, no regional wall motion abnormalities, moderate LVH, grade II diastolic dysfunction, normal RV systolic function, moderately dilated LA. Bioprosthetic aortic valve with some limitation to the aortic valve opening, no regurgitation.   Patient was brought into the ED on 12/17 from his nursing facility. Nursing facility had drawn a BNP, that was elevated to 1010. Patient was noted to be short of breath and had ankle edema on arrival to the ED. In the ED, BNP was only elevated to 373. hsTn 40>45. CXR showed small-moderate left sided pleural effusion and left lower lobe atelectasis or infiltrate. CT chest showed trace pulmonary fibrosis throughout the lungs, likely due to prior COVID infection, bilateral moderate pleural effusions, nonspecific scattered subpleural pulmonary miocronodules. Underwent left thoracentesis on 12/19 that yielded 1300cc fluid.   On interview, patient is oriented to person. Knows that it is 2023, but does not know the month. Knows that we are in some type of medical facility, but does not know which hospital we are at. When told that he is in the hospital, he is unable to recall what kind of symptoms he was having prior to being brought in. Believes that he was sent to the hospital because his blood sugar was 600. Does not remember having sob or chest pain. Patient lives in an assisted living facility, but believes that he has been renting a room and independent. Currently, patient is without chest pain. Does complain of ankle edema and sob.   Past Medical History:  Diagnosis Date  Acute diastolic congestive heart failure (HCC)    AKI (acute kidney injury) ([place])    Aortic valve stenosis s/p AVR 2018   Echo 2/22: Poor acoustic windows, EF 50-55, mild LVH, normal RVSF, mild MR, trivial AI, no AS, no pericardial effusion   Atrial fibrillation (HCC) - post-op CABG    04/2016 CHA2DS2VAS score = 5   Atypical nevi     Coronary artery disease s/p 2 vessel CABG    Depression    "years ago"   Diabetes mellitus    Dyspnea    in the past    GERD (gastroesophageal reflux disease)    Heart murmur    Hepatic cirrhosis ([place]) 06/29/2017   History of kidney stones    Hyperlipidemia    hx of transaminitis secondary to statin and he has decided not to use statins secondary to potential side effects.   Hypertension    Nephrolithiasis    Osteoarthritis, knee    Pericardial effusion a. subxiphoid pericardial window on 04/21/2016 04/28/2016   Peripheral neuropathic pain 05/12/2016   Sleep apnea     Central apnea. 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 2-D echo was normal no embolic source found.    Past Surgical History:  Procedure Laterality Date   AORTIC VALVE REPLACEMENT N/A 03/30/2016   Procedure: AORTIC VALVE REPLACEMENT (AVR);  Surgeon: [name], MD;  Location: [place];  Service: Open Heart Surgery;  Laterality: N/A;   CARDIAC CATHETERIZATION N/A 03/15/2016   Procedure: Right/Left Heart Cath and Coronary Angiography;  Surgeon: Burnell Blanks, MD;  Location: [place] CV LAB;  Service: Cardiovascular;  Laterality: N/A;   CLIPPING OF ATRIAL APPENDAGE N/A 03/30/2016   Procedure: CLIPPING OF LEFT ATRIAL APPENDAGE;  Surgeon: [name], MD;  Location: [place];  Service: Open Heart Surgery;  Laterality: N/A;   CORONARY ARTERY BYPASS GRAFT N/A 03/30/2016   Procedure: CORONARY ARTERY BYPASS GRAFTING (CABG) Times Two;  Surgeon: [name], MD;  Location: [place];  Service: Open Heart Surgery;  Laterality: N/A;   IR THORACENTESIS ASP  PLEURAL SPACE W/IMG GUIDE  02/28/2022   KNEE ARTHROSCOPY W/ PARTIAL MEDIAL MENISCECTOMY  05/12/2005   right, performed by Dr. French Ana for torn medial meniscus.   ROTATOR CUFF REPAIR Right 2016   STERNAL WIRES REMOVAL N/A 08/13/2017   Procedure: STERNAL WIRES REMOVAL;  Surgeon: [name], MD;  Location: [place];  Service: Thoracic;  Laterality: N/A;   SUBXYPHOID PERICARDIAL WINDOW N/A 04/21/2016   Procedure: SUBXYPHOID PERICARDIAL WINDOW;  Surgeon: [name], MD;  Location: [place];  Service: Thoracic;  Laterality: N/A;   TEE WITHOUT CARDIOVERSION N/A 03/30/2016   Procedure: TRANSESOPHAGEAL ECHOCARDIOGRAM (TEE);  Surgeon: [name], MD;  Location: [place];  Service: Open Heart Surgery;  Laterality: N/A;   TEE WITHOUT CARDIOVERSION N/A 04/21/2016   Procedure: TRANSESOPHAGEAL ECHOCARDIOGRAM (TEE);  Surgeon: [name], MD;  Location: [place];  Service: Thoracic;  Laterality: N/A;     Home Medications:  Prior to Admission medications   Medication Sig Start Date End Date Taking? Authorizing Provider  acetaminophen (TYLENOL) 325 MG tablet Take 2  tablets (650 mg total) by mouth every 4 (four) hours as needed for headache or mild pain. 04/28/16  Yes Isaiah Serge, NP  apixaban (ELIQUIS) 5 MG TABS tablet Take 1 tablet by mouth twice daily Patient taking differently: Take 5 mg by mouth 2 (two) times daily. 12/08/20  Yes Burnell Blanks, MD  benzocaine (ORAJEL) 10 % mucosal gel Use as directed 1 Application in the mouth or throat 4 (four) times daily as needed for mouth pain.   Yes [provider]  escitalopram (LEXAPRO) 10 MG tablet Take 1 tablet (10 mg total) by mouth daily. 12/02/20  Yes Christian, Rylee, MD  furosemide (LASIX) 40 MG tablet Take 40 mg by mouth 2 (two) times daily.   Yes [provider]  hydrOXYzine (ATARAX) 10 MG/5ML syrup Take 5 mLs (10 mg total) by mouth 3 (three) times daily as needed for anxiety. 09/28/21  Yes Romana Juniper, MD  insulin aspart (NOVOLOG FLEXPEN) 100 UNIT/ML FlexPen INJECT 12 UNITS UNDER THE SKIN THREE TIMES DAILY WITH MEALS 01/25/22  Yes Linward Natal, MD  ipratropium-albuterol (DUONEB) 0.5-2.5 (3) MG/3ML SOLN Inhale 3 mLs into the lungs every 6 (six) hours as needed (for shortness of breath).   Yes [provider]  metFORMIN (GLUCOPHAGE) 500 MG tablet Take 1 tablet (500 mg total) by mouth 2 (two) times daily with a meal. 12/30/20 02/28/22 Yes Amponsah, Charisse March, MD  metoprolol succinate (TOPROL-XL) 25 MG 24 hr tablet Take 0.5 tablets (12.5 mg total) by mouth daily. 01/26/22 04/26/22 Yes Linward Natal, MD  polyethylene glycol (MIRALAX / GLYCOLAX) 17 g packet Take 17 g by mouth daily. 09/29/21  Yes Romana Juniper, MD  potassium chloride SA (KLOR-CON M) 20 MEQ tablet Take 40 mEq by mouth daily.   Yes [provider]  REPATHA SURECLICK 381 MG/ML SOAJ INJECT 1 PEN INTO THE SKIN EVERY 14 (FOURTEEN) DAYS. 06/21/20  Yes Burnell Blanks, MD  SOLIQUA 100-33 UNT-MCG/ML SOPN Inject 40 Units into the skin daily. Patient taking differently: Inject 40 Units into the skin at bedtime. 12/03/20  Yes Maudie Mercury, MD  valACYclovir (VALTREX) 1000 MG tablet Take 1,000 mg by mouth 2 (two) times daily.   Yes [provider]  vitamin B-12 (CYANOCOBALAMIN) 100 MCG tablet Take 100 mcg by mouth daily.   Yes [provider]  Accu-Chek Softclix Lancets lancets Use as instructed 12/03/20   Maudie Mercury, MD  glucose blood (ACCU-CHEK GUIDE) test strip Use as instructed 12/03/20   Maudie Mercury, MD  Insulin Pen Needle 31G X 5 MM MISC Use one pen needle three times daily. Dx E11.49 09/14/21   [name], MD    Inpatient Medications: Scheduled Meds:  apixaban  5 mg Oral BID   escitalopram  10 mg Oral Daily   furosemide  40 mg Oral BID   insulin aspart  0-15 Units Subcutaneous TID WC   insulin aspart  5 Units Subcutaneous TID WC   insulin glargine-yfgn  28 Units  Subcutaneous QHS   metoprolol succinate  12.5 mg Oral Daily   spironolactone  12.5 mg Oral Daily   cyanocobalamin  100 mcg Oral Daily   Continuous Infusions:  PRN Meds: acetaminophen, polyethylene glycol  Allergies:    Allergies  Allergen Reactions   Morpholine Salicylate Other (See Comments)    Hallucinations   Penicillins Other (See Comments)    Allergic- reaction?? Has patient had a PCN reaction causing immediate rash, facial/tongue/throat swelling, SOB or lightheadedness with hypotension: Unk Has patient had  a PCN reaction causing severe rash involving mucus membranes or skin necrosis: Unk Has patient had a PCN reaction that required hospitalization: Unk Has patient had a PCN reaction occurring within the last 10 years: No If all of the above answers are "NO", then may proceed with Cephalosporin use.   Morphine And Related Other (See Comments)    Hallucinations    Sglt2 Inhibitors Rash and Other (See Comments)    Candidiasis infection prone   Statins Rash and Other (See Comments)    Severe rash and back pain, and muscle pain    Social History:   Social History   Socioeconomic History   Marital status: Divorced    Spouse name: Not on file   Number of children: 1   Years of education: 2y college   Highest education level: Not on file  Occupational History   Occupation:  Medicaid /disability    Employer: UNEMPLOYED    Comment: following stroke in 2007  Tobacco Use   Smoking status: Never   Smokeless tobacco: Never  Vaping Use   Vaping Use: Never used  Substance and Sexual Activity   Alcohol use: Yes    Alcohol/week: 1.0 standard drink of alcohol    Types: 1 Cans of beer per week    Comment: occasional   Drug use: No   Sexual activity: Not Currently  Other Topics Concern   Not on file  Social History Narrative   Single but has a girlfriend.    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       Patient has medications  disability post stroke.            Social Determinants of Health   Financial Resource Strain: Medium Risk (01/18/2022)   Overall Financial Resource Strain (CARDIA)    Difficulty of Paying Living Expenses: Somewhat hard  Food Insecurity: No Food Insecurity (02/27/2022)   Hunger Vital Sign    Worried About Running Out of Food in the Last Year: Never true    Ran Out of Food in the Last Year: Never true  Recent Concern: [place] Present (01/21/2022)   Hunger Vital Sign    Worried About Running Out of Food in the Last Year: Sometimes true    Ran Out of Food in the Last Year: Sometimes true  Transportation Needs: Unmet Transportation Needs (02/27/2022)   PRAPARE - Hydrologist (Medical): Yes    Lack of Transportation (Non-Medical): Yes  Physical Activity: Inactive (01/18/2022)   Exercise Vital Sign    Days of Exercise per Week: 0 days    Minutes of Exercise per Session: 0 min  Stress: Stress Concern Present (01/18/2022)   [place]    Feeling of Stress : Very much  Social Connections: Moderately Isolated (01/18/2022)   Social Connection and Isolation Panel [NHANES]    Frequency of Communication with Friends and Family: More than three times a week    Frequency of Social Gatherings with Friends and Family: Once a week    Attends Religious Services: 1 to 4 times per year    Active Member of [employer] or Organizations: No    Attends Archivist Meetings: Never    Marital Status: Divorced  Human resources officer Violence: Not At Risk (02/27/2022)   Humiliation, Afraid, Rape, and Kick questionnaire    Fear of Current or Ex-Partner: No    Emotionally Abused:  No    Physically Abused: No    Sexually Abused: No    Family History:    Family History  Problem Relation Age of Onset   Hypertension Mother    Diabetes Mother    Alcohol abuse Father      ROS:  Please see  the history of present illness.   All other ROS reviewed and negative.     Physical Exam/Data:   Vitals:   02/28/22 1933 03/01/22 0410 03/01/22 1241 03/01/22 1250  BP: 126/70 120/70 102/69   Pulse: 78 93 (!) 57 78  Resp: 18 18    Temp: 98.3 F (36.8 C) 97.8 F (36.6 C) 97.6 F (36.4 C)   TempSrc: Oral Oral Oral   SpO2: 100% 98% 100%   Weight:  88.5 kg    Height:        Intake/Output Summary (Last 24 hours) at 03/01/2022 1425 Last data filed at 03/01/2022 1241 Gross per 24 hour  Intake 1200 ml  Output 600 ml  Net 600 ml      03/01/2022    4:10 AM 02/28/2022   12:16 AM 02/27/2022    3:05 PM  Last 3 Weights  Weight (lbs) 195 lb 3.2 oz 197 lb 12.8 oz 196 lb 6.9 oz  Weight (kg) 88.542 kg 89.721 kg 89.1 kg     Body mass index is 29.68 kg/m.  General:  Well nourished, well developed, in no acute distress. Laying comfortably in the bed  HEENT: normal Neck: no JVD with head of bed elevated to 30 degrees. No hepatojugular reflex noted  Vascular: Radial pulses 2+ bilaterally Cardiac:  normal S1, S2; iRRR; no murmur. Mechanical S2 click  Lungs:  Crackles in bilateral lung bases. Normal WOB on [place]  Abd: soft, nontender, no hepatomegaly  Ext: 2+ edema in BLE  Musculoskeletal:  No deformities, BUE and BLE strength normal and equal Skin: warm and dry  Neuro:  CNs 2-12 intact, no focal abnormalities noted Psych:  Normal affect   EKG:  The EKG was personally reviewed and demonstrates:  atrial fibrillation, HR 84 BPM, possible LVH  Telemetry:  Telemetry was personally reviewed and demonstrates:  Not on telemetry   Relevant CV Studies:  Echocardiogram 02/28/22  1. Left ventricular ejection fraction, by estimation, is 30 to 35%. The  left ventricle has moderately decreased function. The left ventricle  demonstrates regional wall motion abnormalities (see scoring  diagram/findings for description). Left ventricular   diastolic function could not be evaluated.   2. Right  ventricular systolic function is moderately reduced. The right  ventricular size is normal. There is mildly elevated pulmonary artery  systolic pressure. The estimated right ventricular systolic pressure is  63.8 mmHg.   3. Left atrial size was mild to moderately dilated.   4. The mitral valve is grossly normal. Moderate mitral valve  regurgitation. No evidence of mitral stenosis.   5. The aortic valve is tricuspid. Aortic valve regurgitation is not  visualized. No aortic stenosis is present.   6. The inferior vena cava is dilated in size with <50% respiratory  variability, suggesting right atrial pressure of 15 mmHg.   Comparison(s): Changes from prior study are noted. The left ventricular  function is significantly worse.   Laboratory Data:  High Sensitivity Troponin:   Recent Labs  Lab 02/26/22 1253 02/26/22 1655  TROPONINIHS 40* 45*     Chemistry Recent Labs  Lab 02/27/22 0440 02/28/22 0040 03/01/22 0627  NA 138 137 137  K 3.4*  3.7 3.6  CL 100 102 102  CO2 '25 24 27  '$ GLUCOSE 226* 228* 231*  BUN '16 18 16  '$ CREATININE 1.27* 1.00 0.95  CALCIUM 8.8* 8.6* 8.5*  MG 1.4* 1.7  --   GFRNONAA 60* >60 >60  ANIONGAP '13 11 8    '$ Recent Labs  Lab 02/26/22 1229 02/27/22 0440  PROT 6.7 7.2  ALBUMIN 3.4* 3.6  AST 22 19  ALT 13 12  ALKPHOS 64 62  BILITOT 0.8 0.7   Lipids No results for input(s): "CHOL", "TRIG", "HDL", "LABVLDL", "LDLCALC", "CHOLHDL" in the last 168 hours.  Hematology Recent Labs  Lab 02/26/22 1229 02/27/22 0440  WBC 2.7* 2.3*  RBC 2.96* 3.05*  HGB 9.5* 9.9*  HCT 29.7* 31.1*  MCV 100.3* 102.0*  MCH 32.1 32.5  MCHC 32.0 31.8  RDW 18.8* 18.8*  PLT 223 236   Thyroid No results for input(s): "TSH", "FREET4" in the last 168 hours.  BNP Recent Labs  Lab 02/26/22 1229  BNP 373.4*    DDimer No results for input(s): "DDIMER" in the last 168 hours.   Radiology/Studies:  IR THORACENTESIS ASP PLEURAL SPACE W/IMG GUIDE  Result Date:  02/28/2022 INDICATION: LEFT pleural effusion. EXAM: ULTRASOUND GUIDED DIAGNOSTIC AND THERAPEUTIC LEFT THORACENTESIS MEDICATIONS: None. COMPLICATIONS: None immediate. PROCEDURE: An ultrasound guided thoracentesis was thoroughly discussed with the patient and/or patient's representative and questions answered. The benefits, risks, alternatives and complications were also discussed. The patient understands and wishes to proceed with the procedure. Written consent was obtained. Ultrasound was performed to localize and mark an adequate pocket of fluid in the LEFT chest. The area was then prepped and draped in the normal sterile fashion. 1% Lidocaine was used for local anesthesia. Under ultrasound guidance a 6 Fr Safe-T-Centesis catheter was introduced. Thoracentesis was performed. The catheter was removed and a dressing applied. FINDINGS: A total of approximately 1300 mL of serous pleural fluid was removed. Samples were sent to the laboratory as requested by the clinical team. IMPRESSION: Successful ultrasound guided diagnostic and therapeutic LEFT thoracentesis yielding 1300 mL of pleural fluid. Michaelle Birks, MD [facility] Radiology Electronically Signed   By: Michaelle Birks M.D.   On: 02/28/2022 19:25   DG Chest 1 View  Result Date: 02/28/2022 CLINICAL DATA:  Right thoracentesis. EXAM: CHEST  1 VIEW COMPARISON:  02/26/2022 FINDINGS: The cardio pericardial silhouette is enlarged. There is pulmonary vascular congestion without overt pulmonary edema. Left base collapse/consolidation with small to moderate left pleural effusion is similar to prior. No evidence for right-sided pneumothorax after thoracentesis. Prominent skin fold noted over the lateral right lung base. Telemetry leads overlie the chest. IMPRESSION: 1. No evidence for right-sided pneumothorax after thoracentesis. 2. Persistent left base collapse/consolidation with small to moderate left pleural effusion.  Electronically Signed   By: Misty Stanley M.D.   On: 02/28/2022 14:41   ECHOCARDIOGRAM COMPLETE  Result Date: 02/28/2022    ECHOCARDIOGRAM REPORT   Patient Name:   Mark Cabrera Date of Exam: 02/28/2022 Medical Rec #:  [id number]     Height:       68.0 in Accession #:    [id number]    Weight:       197.8 lb Date of Birth:  September 04, 1950     BSA:          2.034 m Patient Age:    68 years      BP:           103/68 mmHg Patient  Gender: M             HR:           99 bpm. Exam Location:  Inpatient Procedure: 2D Echo, Color Doppler, Cardiac Doppler and Intracardiac            Opacification Agent Indications:    Dyspnea  History:        Patient has prior history of Echocardiogram examinations. CHF,                 CAD, Prior CABG, Stroke, AS, Arrythmias:Atrial Fibrillation,                 Signs/Symptoms:Dyspnea; Risk Factors:Diabetes, Hypertension,                 Dyslipidemia and Sleep Apnea. Hx of pericardial effusion s/p                 pericardial subxyphoid window.  Sonographer:    Eartha Inch Referring Phys: Christiana Fuchs  Sonographer Comments: Technically difficult study due to poor echo windows. Image acquisition challenging due to patient body habitus and Image acquisition challenging due to respiratory motion. IMPRESSIONS  1. Left ventricular ejection fraction, by estimation, is 30 to 35%. The left ventricle has moderately decreased function. The left ventricle demonstrates regional wall motion abnormalities (see scoring diagram/findings for description). Left ventricular  diastolic function could not be evaluated.  2. Right ventricular systolic function is moderately reduced. The right ventricular size is normal. There is mildly elevated pulmonary artery systolic pressure. The estimated right ventricular systolic pressure is 14.4 mmHg.  3. Left atrial size was mild to moderately dilated.  4. The mitral valve is grossly normal. Moderate mitral valve regurgitation. No evidence of mitral stenosis.  5. The  aortic valve is tricuspid. Aortic valve regurgitation is not visualized. No aortic stenosis is present.  6. The inferior vena cava is dilated in size with <50% respiratory variability, suggesting right atrial pressure of 15 mmHg. Comparison(s): Changes from prior study are noted. The left ventricular function is significantly worse. FINDINGS  Left Ventricle: Left ventricular ejection fraction, by estimation, is 30 to 35%. The left ventricle has moderately decreased function. The left ventricle demonstrates regional wall motion abnormalities. Definity contrast agent was given IV to delineate the left ventricular endocardial borders. The left ventricular internal cavity size was normal in size. There is no left ventricular hypertrophy. Left ventricular diastolic function could not be evaluated due to atrial fibrillation. Left ventricular diastolic function could not be evaluated.  LV Wall Scoring: The apical septal segment and apex are akinetic. Right Ventricle: The right ventricular size is normal. No increase in right ventricular wall thickness. Right ventricular systolic function is moderately reduced. There is mildly elevated pulmonary artery systolic pressure. The tricuspid regurgitant velocity is 2.41 m/s, and with an assumed right atrial pressure of 15 mmHg, the estimated right ventricular systolic pressure is 81.8 mmHg. Left Atrium: Left atrial size was mild to moderately dilated. Right Atrium: Right atrial size was normal in size. Pericardium: Trivial pericardial effusion is present. Mitral Valve: The mitral valve is grossly normal. Moderate mitral valve regurgitation. No evidence of mitral valve stenosis. MV peak gradient, 8.1 mmHg. The mean mitral valve gradient is 4.0 mmHg. Tricuspid Valve: The tricuspid valve is grossly normal. Tricuspid valve regurgitation is mild . No evidence of tricuspid stenosis. Aortic Valve: The aortic valve is tricuspid. Aortic valve regurgitation is not visualized. No aortic  stenosis is present. Aortic valve mean gradient measures  10.0 mmHg. Aortic valve peak gradient measures 16.5 mmHg. Pulmonic Valve: The pulmonic valve was grossly normal. Pulmonic valve regurgitation is trivial. No evidence of pulmonic stenosis. Aorta: The aortic root and ascending aorta are structurally normal, with no evidence of dilitation. Venous: The inferior vena cava is dilated in size with less than 50% respiratory variability, suggesting right atrial pressure of 15 mmHg. IAS/Shunts: The atrial septum is grossly normal. Additional Comments: There is a small pleural effusion in the left lateral region.  LEFT VENTRICLE PLAX 2D LVIDd:         5.20 cm      Diastology LVIDs:         4.70 cm      LV e' medial:    2.50 cm/s LV PW:         0.90 cm      LV E/e' medial:  53.2 LV IVS:        1.10 cm      LV e' lateral:   8.25 cm/s                             LV E/e' lateral: 16.1  LV Volumes (MOD) LV vol d, MOD A2C: 177.0 ml LV vol d, MOD A4C: 160.0 ml LV vol s, MOD A2C: 127.0 ml LV vol s, MOD A4C: 86.5 ml LV SV MOD A2C:     50.0 ml LV SV MOD A4C:     160.0 ml LV SV MOD BP:      66.4 ml RIGHT VENTRICLE            IVC RV S prime:     6.75 cm/s  IVC diam: 2.80 cm TAPSE (M-mode): 0.7 cm LEFT ATRIUM              Index        RIGHT ATRIUM           Index LA diam:        5.60 cm  2.75 cm/m   RA Area:     20.20 cm LA Vol (A2C):   112.0 ml 55.06 ml/m  RA Volume:   57.60 ml  28.32 ml/m LA Vol (A4C):   74.9 ml  36.82 ml/m LA Biplane Vol: 92.1 ml  45.28 ml/m  AORTIC VALVE AV Vmax:           203.00 cm/s AV Vmean:          150.000 cm/s AV VTI:            0.350 m AV Peak Grad:      16.5 mmHg AV Mean Grad:      10.0 mmHg LVOT Vmax:         89.40 cm/s LVOT Vmean:        64.600 cm/s LVOT VTI:          0.174 m LVOT/AV VTI ratio: 0.50  AORTA Ao Asc diam: 3.80 cm MITRAL VALVE                  TRICUSPID VALVE MV Area (PHT): 4.29 cm       TR Peak grad:   23.2 mmHg MV Peak grad:  8.1 mmHg       TR Mean grad:   16.0 mmHg MV Mean grad:   4.0 mmHg       TR Vmax:        241.00 cm/s MV Vmax:  1.42 m/s       TR Vmean:       194.0 cm/s MV Vmean:      91.2 cm/s MV Decel Time: 177 msec       SHUNTS MR Peak grad:    89.1 mmHg    Systemic VTI: 0.17 m MR Mean grad:    57.0 mmHg MR Vmax:         472.00 cm/s MR Vmean:        350.0 cm/s MR PISA:         3.08 cm MR PISA Eff ROA: 23 mm MR PISA Radius:  0.70 cm MV E velocity: 133.00 cm/s Eleonore Chiquito MD Electronically signed by Eleonore Chiquito MD Signature Date/Time: 02/28/2022/12:21:05 PM    Final    CT CHEST WO CONTRAST  Result Date: 02/27/2022 CLINICAL DATA:  Dyspnea, chronic, chest wall or pleura disease suspected EXAM: CT CHEST WITHOUT CONTRAST TECHNIQUE: Multidetector CT imaging of the chest was performed following the standard protocol without IV contrast. RADIATION DOSE REDUCTION: This exam was performed according to the departmental dose-optimization program which includes automated exposure control, adjustment of the mA and/or kV according to patient size and/or use of iterative reconstruction technique. COMPARISON:  CT chest 02/19/2020 FINDINGS: Cardiovascular: Normal heart size. No significant pericardial effusion. The thoracic aorta is normal in caliber. Moderate atherosclerotic plaque of the thoracic aorta. Four-vessel coronary artery calcifications. Aortic valve replacement. Atrial appendage clip. Mediastinum/Nodes: No enlarged mediastinal or axillary lymph nodes. Thyroid gland, trachea, and esophagus demonstrate no significant findings. Lungs/Pleura: Partial collapse of the left lower lobe. Vague scattered peripheral reticulations likely sequelae of prior infection status elation noted in 2021. No focal consolidation. Several scattered subpleural pulmonary micronodules. No pulmonary mass. Bilateral moderate pleural effusions. No pneumothorax. Upper Abdomen: Cirrhotic morphology of the liver. No acute abnormality. Musculoskeletal: No chest wall abnormality. No suspicious lytic or blastic  osseous lesions. No acute displaced fracture. Moderate degenerative changes of bilateral shoulders. Multilevel degenerative changes of the spine. Healed prior sternotomy. Lipomatous lesion of the left shoulder anterior musculature (3:19). IMPRESSION: 1. Trace pulmonary fibrosis throughout the lungs likely due to prior COVID infection. Superimposed developing patchy airspace opacities that could represent acute infection/inflammation not fully excluded. 2. Bilateral moderate pleural effusions. 3. Nonspecific scattered subpleural pulmonary micronodules. No follow-up needed if patient is low-risk (and has no known or suspected primary neoplasm). Non-contrast chest CT can be considered in 12 months if patient is high-risk. This recommendation follows the consensus statement: Guidelines for Management of Incidental Pulmonary Nodules Detected on CT Images: From the Fleischner Society 2017; Radiology 2017; 284:228-243. 4. Aortic Atherosclerosis (ICD10-I70.0) including four-vessel coronary artery calcification. Electronically Signed   By: Iven Finn M.D.   On: 02/27/2022 22:10   DG Chest 2 View  Result Date: 02/26/2022 CLINICAL DATA:  Shortness of breath. EXAM: CHEST - 2 VIEW COMPARISON:  01/17/2022 FINDINGS: Heart size is within normal limits. Prior CABG and aortic valve replacement again noted. Increased diffuse interstitial infiltrates are suspicious for mild interstitial edema. New small to moderate left pleural effusion and left lower lobe atelectasis or infiltrate are also seen. IMPRESSION: Increased diffuse interstitial infiltrates, suspicious for mild interstitial edema. New small to moderate left pleural effusion, and left lower lobe atelectasis or infiltrate. Electronically Signed   By: Marlaine Hind M.D.   On: 02/26/2022 13:26     Assessment and Plan:   Acute on chronic HFrEF  History of pericardial effusion in 2018, s/p pericardial window  Bilateral Pleural Effusions  -  Echocardiogram this  admission showed EF 30-35% with regional wall motion abnormalities (akinesis of the septal segment and apex), moderately reduced RV systolic function  - Patient presented with SOB, ankle edema. BNP 373.4. CT chest with moderate bilateral pleural effusions  - Underwent L thoracentesis yesterday that yielded 1300cc. CXR yesterday after procedure with small-moderate left pleural effusion  - Has been given IV lasix and PO lasix-- renal function stable  - I/Os not complete. Weight down from 197.8lbs yesterday to 195.2 lbs  - Patient continues to have ankle edema, crackles in lung base, on supplemental oxygen  - Continue IV diuresis  -  metoprolol succinate 12.5 mg daily, spironolactone 12.5 mg daily started today  - BP somewhat soft. See how BP tolerates spironolactone and metoprolol and consider adding losartan if BP tolerates  - Patient has dementia, lives in assisted living. No complaints of chest pain. Although patient has a newly reduced EF, he is not a good candidate for cath and I suspect we will continue to manage medically   CAD s/p CABG  - Underwent CABG x2 with SVG-PDA and SVG-OM1 on 03/30/16 - More recent cath 01/10/19 with occlusion of the SVG-OM, severe stenosis of the SVG-PDA, and chronic total occlusion of the LAD. Treated with DES to mid LAD, DES to SVG-PD - Patient now with reduced EF, but he is not a candidate for cath.  - Not on ASA due to eliquis use - Continue metoprolol  - Patient is intolerant of statins, on repatha   Paroxysmal Atrial Fibrillation  - Patient presented in afib, HR well controlled - Not on telemetry (refuses), but HR has been in the 70s-90s per recorded vital signs  - Continue eliquis 5 mg BID - Metoprolol as above   S/p AVR - Patient had a surgical aortic valve replacement in 2018 - Echocardiogram this admission showed no aortic stenosis or regurgitation   Otherwise per primary  - Chronic leukopenia   - Anemia - Cognitive impairment  - Depression   - Venous stasis dermatitis   Risk Assessment/Risk Scores:    New York Heart Association (NYHA) Functional Class NYHA Class III  CHA2DS2-VASc Score = 4   This indicates a 4.8% annual risk of stroke. The patient's score is based upon: CHF History: 1 HTN History: 1 Diabetes History: 0 Stroke History: 0 Vascular Disease History: 1 Age Score: 1 Gender Score: 0   For questions or updates, please contact [place] Please consult [email or web address] for contact info under    Signed, Margie Billet, PA-C  03/01/2022 2:25 PM  I have examined the patient and reviewed assessment and plan and discussed with patient.  Agree with above as stated.    Patient with decreased EF on recent echo.  Evidence of volume overload noted with LE edema.  Decreased breath sounds at the left lung base.  Given his overall health status with dementia, would not pursue invasive testing.  Continue symptom management.  Diuretics to help with volume overload.  He appears fairly comfortable at this time.  Would focus more on symptom management.  Palliative care would also be reasonable option.  Larae Grooms

## 2022-03-01 NOTE — Progress Notes (Signed)
Subjective: The patient says he currently feels like his breathing is better. He does remember having a needle stuck in his back for thoracentesis. He says he is unable to compare his breathing today compared to yesterday but it is ok when he is up and moving. He later starts talking about needing oxygen to breath and frantically asks for the [place]. He also says his legs are painful and tight and he feels like the R upper leg is more tense and swollen than the bottom. He is amenable to starting new meds for his fluid and he seems to be okay with repeat thoracentesis if needed.  Objective:  Vital signs in last 24 hours: Vitals:   02/28/22 1933 03/01/22 0410 03/01/22 1241 03/01/22 1250  BP: 126/70 120/70 102/69   Pulse: 78 93 (!) 57 78  Resp: 18 18    Temp: 98.3 F (36.8 C) 97.8 F (36.6 C) 97.6 F (36.4 C)   TempSrc: Oral Oral Oral   SpO2: 100% 98% 100%   Weight:  88.5 kg    Height:       Gen: Well developed, well nourishes, restless and anxious, cooperative Neck: prominence of vasculature in the head and neck, no elevated JVD Cardiovascular: Regular rate, irregularly irregular rhythm. No murmurs, rubs, or gallops. 2+ radial and PT pulses bilaterally.  Pulmonary: Minimal increased WOB on RA,, absent breath sounds at the L lower lung base,rales at the R lung base  Abdominal: Soft, NT, ND, normal active bowel sounds  Neurological:alert and attentive Skin: warm, dry, 2+ bilateral pitting edema, non-blanching erythema of the bilateral LEs improving on the L but still apparent on the right, L upper leg slightly larger than R upper leg  Assessment/Plan:  Principal Problem:   Acute exacerbation of CHF (congestive heart failure) (HCC)  Mark Cabrera is a 71 y/o male with past medical history of T2DM, HLD, HTN, HFpEF (EF 50-55% in 09/2021), CAD s/p CABG, afib, AVR, dementia, compensated cirrhosis, and CVA that presents with worsening shortness of breath and lower extremity swelling and admitted  for profound subjective dyspnea, found to have new HFrEF with L>R pleural effusion, doing better after R thoracentesis and awaiting cardiology evaluation.   Acute HFrEF exacerbationAS s/p AVR L>R pleural effusion  On exam patient continues to have 2+ bilateral pitting edema of the shins, although does have interval improvement of L LE venous stasis dermatitis. Satting 99% on RA but still having  subjective dyspnea although seems to be doing better since R thoracentesis yesterday per IR with 1.3L removal and transudative effusion given LDH 70,protein<3, no WBC or PMNs and yellow/ clear color. Now has  crackles at the R base on lung exam but has absent lung sounds at the L lung base consistent with his persistent L moderate pleural effusion. His L effusion was requested for tap, although the R was performed and it is unclear how the selection occurred. May need to tap the L side as well. Will give IV lasix in addition to home PO lasix given normal renal function with creatinine of 0.95. Will also ensure his compression stockings are applied. Will resume his metoprolol and also start spironolactone given new HFrEF and need to optimize GDMT. BP is consistently in 120s with some drops to the low 100s and I suspect this will limit GDMT. Given A1c of >14 not a candidate for SGLT2 at this time. Cardiology to evaluate the patient today for suspected ischemic cardiomyopathy -IV lasix 40 mg -Lasix '40mg'$  BID oral -Start  Spironolactone 12.5 mg daily -Resume metoprolol 12.5 mg daily -Tylenol for pain -Strict Is/Os  Venous stasis dermatitis Interval improvement of his LLE venous stasis dermatitis, RLE still persistently erythematous about the entire anterior tibia. Compression stockings not in place on evaluation today, will ensure those are placed. -compression stockings   CAD s/p CABGAfibCVA A-fib without RVR.  -Eliquis 5 mg BID -Resume metoprolol 12.'5mg'$  daily   T2DM A1c > 14 1 month ago. Patient receives  soliqua 40 units at bedtime, Novolog 12 units TID w/ meals, and Metformin 500 mg BID at his facility. Fasting BG 216 this AM despite increase of semglee yesterday. Will increase dose again today for better basal coverage. Only required 20u total short acting yesterday, will not increase this at this time. -SSI set to moderate -5u novalog TID -Semglee increased to 28 units   HLD  Holding Repatha currently. Will likely restart upon discharge.    Chronic LeukopeniaAnemia CBC demonstrates leukopenia of 2.7 on admission. Chronic but stable.  -Trend CBC   Cognitive impairment Depression A&O x4, patient appears to have decision making capacity for thoracentesis discussed with him today. -Escitalopram 10 mg daily for depression  -Hold hydroxyzine 10 mg/5 mL syrup (5 mL TID w/ meals PRN; anticholinergic, should be avoided particularly in cognitive impairment)  Prior to Admission Living Arrangement: Anticipated Discharge Location: Barriers to Discharge: Dispo: Anticipated discharge in approximately 1 day(s).   Iona Coach, MD 03/01/2022, 2:25 PM Pager: [phone] After 5pm on weekdays and 1pm on weekends: On Call pager [phone]

## 2022-03-01 NOTE — Progress Notes (Addendum)
   Heart Failure Stewardship Pharmacist Progress Note   PCP: [name], MD PCP-Cardiologist: Lauree Chandler, MD    HPI:  71 yo M with PMH of T2DM, HLD, HTN, CHF, cirrhosis, CAD s/p CABG and AVR, dementia, and CVA.  Presented to the ED from SNF on 12/17 with shortness of breath, LE edema, and elevated BNP. CXR with increased diffuse interstitial infiltrates, suspicious for mild interstitial edema. S/p thoracentesis on 12/19, removing 1300 mL of fluid. ECHO 12/19 showed LVEF reduced to 30-35% (was 50-55% in 09/2021), regional wall motion abnormalities, RV moderately reduced. Cardiology is being consulted.   Current HF Medications: Diuretic: furosemide 40 mg PO BID  Prior to admission HF Medications: Diuretic: furosemide 40 mg PO BID Beta blocker: metoprolol XL 12.5 mg daily  Pertinent Lab Values: Serum creatinine 0.95, BUN 16, Potassium 3.6, Sodium 137, BNP 1010>373.4, Magnesium 1.7, A1c >14   Vital Signs: Weight: 195 lbs (admission weight: 196 lbs) Blood pressure: 120/70s  Heart rate: 90s  I/O: +0.7L yesterday; net +0.4L  Assessment: 1. Acute on chronic systolic CHF (LVEF 64-68%), due to ICM. NYHA class II symptoms. - Continue furosemide 40 mg PO BID. Strict I/Os and daily weights. Keep K>4 and Mag>2. Recommend KCl 40 mEq x 1 and Magnesium 2g IV x 1 - Consider resuming PTA metoprolol XL 12.5 mg daily - Consider starting losartan 12.5 mg daily and spironolactone 12.5 mg daily - No SGLT2i - high risk for UTI given A1c>14 and dementia   Plan: 1) Medication changes recommended at this time: - Restart metoprolol XL 12.5 mg daily - Start losartan 12.5 mg daily - Start spironolactone 12.5 mg daily - KCl 40 mEq x 1 - Magnesium 2g IV x 1  2) Patient assistance: - Returning to [place], PharmD, BCPS Heart Failure Stewardship Pharmacist Phone [phone]

## 2022-03-01 NOTE — Progress Notes (Signed)
[facility]) Hospital Liaison note:  Notified by Ardeen Fillers of request for [place] services at [facility]. Will continue to follow for disposition.  Please call with any outpatient palliative questions or concerns.  Thank you for the opportunity to participate in this patient's care.  Thank you, Lorelee Market, LPN [place] [facility] Liaison [phone]

## 2022-03-01 NOTE — TOC Initial Note (Addendum)
Transition of Care [facility]) - Initial/Assessment Note    Patient Details  Name: Mark Cabrera MRN: [id number] Date of Birth: 11-10-1950  [facility]) CM/SW Contact:    Bethann Berkshire, [place] Phone Number: 03/01/2022, 10:50 AM  Clinical Narrative:                  CSW acknowledges consult regarding housing though CSW is familiar with this pt and his guardian. Pt was previously placed in SNF for rehab and eventual transition to LTC.   CSW contacted [facility] and confirmed pt is from there and can return at DC. He will need to be at least 24 hours without safety sitter before return.   CSW called pt's daughter and confirmed pt is from [facility]. She is hoping that pt can return to a LTC bed at [facility] instead of the STR side. She is also interested in OP palliative at SNF; CSW will discuss with attending and make referral if appropriate.   Planned Disposition: Skilled Nursing Facility Barriers to Discharge: Continued Medical Work up   Patient Goals and CMS Choice            Expected Discharge Plan and Services Planned Disposition: [place]                                              Prior Living Arrangements/Services                       Activities of Daily Living Home Assistive Devices/Equipment: Environmental consultant (specify type) ADL Screening (condition at time of admission) Patient's cognitive ability adequate to safely complete daily activities?: Yes Is the patient deaf or have difficulty hearing?: No Does the patient have difficulty seeing, even when wearing glasses/contacts?: Yes Does the patient have difficulty concentrating, remembering, or making decisions?: Yes Patient able to express need for assistance with ADLs?: Yes Does the patient have difficulty dressing or bathing?: No Independently performs ADLs?: Yes (appropriate for developmental age) Does the patient have difficulty walking or climbing stairs?: Yes Weakness of  Legs: Both Weakness of Arms/Hands: None  Permission Sought/Granted                  Emotional Assessment              Admission diagnosis:  Acute exacerbation of CHF (congestive heart failure) ([place]) [I50.9] Acute on chronic congestive heart failure, unspecified heart failure type ([place]) [I50.9] Patient Active Problem List   Diagnosis Date Noted   Acute exacerbation of CHF (congestive heart failure) ([place]) 02/28/2022   Acute venous stasis dermatitis of both lower extremities 02/27/2022   Acute on chronic diastolic heart failure with preserved ejection fraction ([place]) 02/26/2022   Pressure injury of skin 01/24/2022   Hyperglycemia 01/17/2022   Neutropenia ([place])    Fever, unspecified    Malnutrition of moderate degree 09/23/2021   Physical deconditioning 09/21/2021   Otitis externa 12/31/2020   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   Balanitis 31/49/7026   Complicated urinary tract infection    Acute kidney injury ([place])    Thrombocytopenia ([place])    Hyponatremia 11/08/2020   Left shoulder pain 10/20/2020   Dyspnea 09/23/2020   Cognitive impairment with history of delirium 08/25/2020   Hip pain, chronic, left 07/05/2020   Epididymitis 04/14/2020   Fatigue 03/18/2020   Hypotension 03/10/2020   Abdominal  wall bulge 12/03/2019   Dermoid inclusion cyst 08/07/2019   Chronic left shoulder pain 05/13/2019   Depression 11/05/2018   Blurry vision, bilateral 09/20/2018   CVA (cerebral vascular accident) ([place]) 06/28/2018   Hepatic cirrhosis ([place]) 06/29/2017   Healthcare maintenance 12/16/2016   Lipoma of left upper extremity 09/12/2016   Chronic knee pain 07/04/2016   Decreased visual acuity 07/04/2016   Pleural effusion, left 04/28/2016   Paroxysmal atrial fibrillation ([place]) 04/16/2016   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/31/2016   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   CAD (coronary artery disease), native coronary artery    Chronic diastolic heart failure ([place])    Sleep apnea  08/02/2009   Diabetes mellitus with neurological manifestation ([place]) 10/18/2007   Dyslipidemia 05/18/2006   Hypertensive heart disease    PCP:  [name], MD Pharmacy:  No Pharmacies Listed    Social Determinants of Health (SDOH) Social History: SDOH Screenings   Food Insecurity: No Food Insecurity (02/27/2022)  Recent Concern: Food Insecurity - Food Insecurity Present (01/21/2022)  Housing: High Risk (02/27/2022)  Transportation Needs: Unmet Transportation Needs (02/27/2022)  Utilities: Not At Risk (02/27/2022)  Recent Concern: Utilities - At Risk (01/21/2022)  Alcohol Screen: Low Risk  (01/18/2022)  Depression (PHQ2-9): Low Risk  (01/18/2022)  Financial Resource Strain: Medium Risk (01/18/2022)  Physical Activity: Inactive (01/18/2022)  Social Connections: Moderately Isolated (01/18/2022)  Stress: Stress Concern Present (01/18/2022)  Tobacco Use: Low Risk  (02/28/2022)   SDOH Interventions:     Readmission Risk Interventions     No data to display

## 2022-03-01 NOTE — Procedures (Signed)
PROCEDURE SUMMARY:  Successful US guided right thoracentesis. Yielded 1300 of pleural fluid. Pt tolerated procedure well. No immediate complications.  Specimen was sent for labs. CXR ordered.  EBL < 5 mL  Deklyn [place]bon PA-C 03/01/2022 10:50 AM

## 2022-03-01 NOTE — Progress Notes (Signed)
Notified by RN that patient has not been able to sleep for the last several days despite using Seroquel.  Patient also reports feeling shortness of breath.  When evaluated at bedside, he appears comfortable in no acute respiratory distress.  He reports dyspnea symptoms that has been going on for the last few days but nothing acute.  Patient is using 4 L of oxygen but satting at 100% likely for comfort.  Lungs are clear with no obvious crackles or wheezing. +1-2 LE edema bilat. Will check a stat chest x-ray to rule out any pathology.  If unremarkable will give Haldol to help with sleep.

## 2022-03-01 NOTE — Progress Notes (Signed)
Heart Failure Navigator Progress Note  Assessed for Heart & Vascular TOC clinic readiness.  Patient EF 30-35%, has a cardiologist [facility] follow up scheduled on 03/09/22.   Navigator available for reassessment of patient.   Mark Cabrera, BSN, Clinical cytogeneticist Only

## 2022-03-01 NOTE — TOC Progression Note (Signed)
Transition of Care [facility]) - Progression Note    Patient Details  Name: Mark Cabrera MRN: [id number] Date of Birth: 08-06-1950  [facility]) CM/SW [place], [place] Phone Number: 03/01/2022, 3:30 PM  Clinical Narrative:     [facility] confirmed pt will admit to LTC bed when he returns to SNF. Pt's daughter is completing return paperwork today. CSW updated daughter.   Discussed OP palliative with MD. Palliative referral is appropriate. CSW made referral to [facility].    Planned Disposition: Skilled Nursing Facility Barriers to Discharge: Continued Medical Work up  Expected Discharge Plan and Services                                               Social Determinants of Health (SDOH) Interventions SDOH Screenings   Food Insecurity: No Food Insecurity (02/27/2022)  Recent Concern: [place] Present (01/21/2022)  Housing: High Risk (02/27/2022)  Transportation Needs: Unmet Transportation Needs (02/27/2022)  Utilities: Not At Risk (02/27/2022)  Recent Concern: Utilities - At Risk (01/21/2022)  Alcohol Screen: Low Risk  (01/18/2022)  Depression (PHQ2-9): Low Risk  (01/18/2022)  Financial Resource Strain: Medium Risk (01/18/2022)  Physical Activity: Inactive (01/18/2022)  Social Connections: Moderately Isolated (01/18/2022)  Stress: Stress Concern Present (01/18/2022)  Tobacco Use: Low Risk  (02/28/2022)    Readmission Risk Interventions     No data to display

## 2022-03-02 ENCOUNTER — Inpatient Hospital Stay (HOSPITAL_COMMUNITY): Payer: Medicare Other

## 2022-03-02 DIAGNOSIS — I509 Heart failure, unspecified: Secondary | ICD-10-CM

## 2022-03-02 DIAGNOSIS — I5033 Acute on chronic diastolic (congestive) heart failure: Secondary | ICD-10-CM | POA: Diagnosis not present

## 2022-03-02 DIAGNOSIS — I25118 Atherosclerotic heart disease of native coronary artery with other forms of angina pectoris: Secondary | ICD-10-CM

## 2022-03-02 DIAGNOSIS — J90 Pleural effusion, not elsewhere classified: Secondary | ICD-10-CM | POA: Diagnosis not present

## 2022-03-02 DIAGNOSIS — I5031 Acute diastolic (congestive) heart failure: Secondary | ICD-10-CM | POA: Diagnosis not present

## 2022-03-02 DIAGNOSIS — I480 Paroxysmal atrial fibrillation: Secondary | ICD-10-CM

## 2022-03-02 DIAGNOSIS — R0600 Dyspnea, unspecified: Secondary | ICD-10-CM | POA: Diagnosis not present

## 2022-03-02 HISTORY — PX: IR THORACENTESIS ASP PLEURAL SPACE W/IMG GUIDE: IMG5380

## 2022-03-02 LAB — BASIC METABOLIC PANEL
Anion gap: 8 (ref 5–15)
BUN: 18 mg/dL (ref 8–23)
CO2: 27 mmol/L (ref 22–32)
Calcium: 8.7 mg/dL — ABNORMAL LOW (ref 8.9–10.3)
Chloride: 100 mmol/L (ref 98–111)
Creatinine, Ser: 1.1 mg/dL (ref 0.61–1.24)
GFR, Estimated: >60 mL/min (ref >60)
Glucose, Bld: 279 mg/dL — ABNORMAL HIGH (ref 70–99)
Potassium: 3.6 mmol/L (ref 3.5–5.1)
Sodium: 135 mmol/L (ref 135–145)

## 2022-03-02 LAB — GLUCOSE, CAPILLARY
Glucose-Capillary: 115 mg/dL — ABNORMAL HIGH (ref 70–99)
Glucose-Capillary: 210 mg/dL — ABNORMAL HIGH (ref 70–99)
Glucose-Capillary: 160 mg/dL — ABNORMAL HIGH (ref 70–99)
Glucose-Capillary: 106 mg/dL — ABNORMAL HIGH (ref 70–99)

## 2022-03-02 LAB — LACTATE DEHYDROGENASE, PLEURAL OR PERITONEAL FLUID: LD, Fluid: 65 U/L — ABNORMAL HIGH (ref 3–23)

## 2022-03-02 LAB — PROTEIN, PLEURAL OR PERITONEAL FLUID: Total protein, fluid: <3 g/dL

## 2022-03-02 LAB — GLUCOSE, PLEURAL OR PERITONEAL FLUID: Glucose, Fluid: 196 mg/dL

## 2022-03-02 LAB — MAGNESIUM: Magnesium: 1.6 mg/dL — ABNORMAL LOW (ref 1.7–2.4)

## 2022-03-02 MED ORDER — LIDOCAINE HCL 1 % IJ SOLN
INTRAMUSCULAR | Status: AC
  Filled 2022-03-02: qty 20

## 2022-03-02 MED ORDER — QUETIAPINE FUMARATE 50 MG PO TABS
50.0000 mg | ORAL_TABLET | Freq: Once | ORAL | Status: AC
  Administered 2022-03-02: 50 mg via ORAL
  Filled 2022-03-02: qty 1

## 2022-03-02 MED ORDER — FUROSEMIDE 10 MG/ML IJ SOLN
40.0000 mg | Freq: Once | INTRAMUSCULAR | Status: AC
  Administered 2022-03-02: 40 mg via INTRAVENOUS
  Filled 2022-03-02: qty 4

## 2022-03-02 MED ORDER — MAGNESIUM SULFATE 2 GM/50ML IV SOLN
2.0000 g | Freq: Once | INTRAVENOUS | Status: AC
  Administered 2022-03-02: 2 g via INTRAVENOUS
  Filled 2022-03-02: qty 50

## 2022-03-02 MED ORDER — POTASSIUM CHLORIDE CRYS ER 20 MEQ PO TBCR
40.0000 meq | EXTENDED_RELEASE_TABLET | Freq: Once | ORAL | Status: AC
  Administered 2022-03-02: 40 meq via ORAL
  Filled 2022-03-02: qty 2

## 2022-03-02 MED ORDER — METFORMIN HCL 500 MG PO TABS
500.0000 mg | ORAL_TABLET | Freq: Two times a day (BID) | ORAL | Status: DC
  Administered 2022-03-02 – 2022-03-03 (×3): 500 mg via ORAL
  Filled 2022-03-02 (×3): qty 1

## 2022-03-02 NOTE — Progress Notes (Addendum)
   Heart Failure Stewardship Pharmacist Progress Note   PCP: [name], MD PCP-Cardiologist: Lauree Chandler, MD    HPI:  71 yo M with PMH of T2DM, HLD, HTN, CHF, cirrhosis, CAD s/p CABG and AVR, dementia, and CVA.  [facility]ed [facility] the ED from [facility] on 12[facility]17 with shortness of breath, LE edema, and elevated BNP. CXR with increased diffuse interstitial infiltrates, suspicious for mild interstitial edema. S/p L thoracentesis on 12/19, removing 1300 mL of fluid. ECHO 12/19 showed LVEF reduced to 30-35% (was 50-55% in 09/2021), regional wall motion abnormalities, RV moderately reduced. Cardiology consulted. Repeat thoracentesis 12/21 removed 1300 mL fluid.  Current HF Medications: Diuretic: furosemide 40 mg IV x 1 then resuming furosemide 40 mg PO BID Beta Blocker: metoprolol XL 12.5 mg daily MRA: spironolactone 12.5 mg daily  Prior to admission HF Medications: Diuretic: furosemide 40 mg PO BID Beta blocker: metoprolol XL 12.5 mg daily  Pertinent Lab Values: Serum creatinine 1.10, BUN 18, Potassium 3.6, Sodium 135, BNP 1010>373.4, Magnesium 1.6, A1c >14   Vital Signs: Weight: 195 lbs (admission weight: 196 lbs) Blood pressure: 110/70s  Heart rate: 60s  I/O: -0.2L yesterday; net +0.1L  Assessment: 1. Acute on chronic systolic CHF (LVEF 03-00%), due to ICM. NYHA class II symptoms. - Receiving 1 dose of IV lasix then continuing furosemide 40 mg PO BID. Strict I/Os and daily weights. Keep K>4 and Mag>2. Received KCl 40 mEq x 1 and Magnesium 2g IV x 1 - Continue metoprolol XL 12.5 mg daily - Consider starting losartan 12.5 mg daily prior to discharge pending BP trends - Continue spironolactone 12.5 mg daily - No SGLT2i - high risk for UTI given A1c>14 and dementia   Plan: 1) Medication changes recommended at this time: - Continue current regimen  2) Patient assistance: - Returning to [place], PharmD, BCPS Heart Failure Stewardship  Pharmacist Phone [phone]

## 2022-03-02 NOTE — Procedures (Addendum)
PROCEDURE SUMMARY:  Successful US guided left thoracentesis. Yielded 1.3 L of dark yellow fluid. Pt tolerated procedure well. No immediate complications.  Specimen sent for labs. CXR ordered.  EBL < 5 mL  Theresa Duty, NP 03/02/2022 2:06 PM

## 2022-03-02 NOTE — Progress Notes (Addendum)
Rounding Note    Patient Name: Mark Cabrera Date of Encounter: 03/02/2022  [place] Cardiologist: Lauree Chandler, MD   Subjective   Laying in bed. No complaints offered.   Inpatient Medications    Scheduled Meds:  apixaban  5 mg Oral BID   escitalopram  10 mg Oral Daily   furosemide  40 mg Oral BID   insulin aspart  0-15 Units Subcutaneous TID WC   insulin aspart  5 Units Subcutaneous TID WC   insulin glargine-yfgn  28 Units Subcutaneous QHS   metFORMIN  500 mg Oral BID WC   metoprolol succinate  12.5 mg Oral Daily   spironolactone  12.5 mg Oral Daily   cyanocobalamin  100 mcg Oral Daily   Continuous Infusions:  PRN Meds: acetaminophen, polyethylene glycol, sodium chloride   Vital Signs    Vitals:   03/01/22 1250 03/01/22 1614 03/01/22 1928 03/02/22 0537  BP:  (!) 149/81 108/78 (!) 145/54  Pulse: 78 67 62 84  Resp:   18 16  Temp:  97.6 F (36.4 C) 97.6 F (36.4 C) (!) 97.4 F (36.3 C)  TempSrc:  Axillary Oral Oral  SpO2:  100% 100% 98%  Weight:    88.7 kg  Height:        Intake/Output Summary (Last 24 hours) at 03/02/2022 1045 Last data filed at 03/02/2022 0272 Gross per 24 hour  Intake 720 ml  Output 700 ml  Net 20 ml      03/02/2022    5:37 AM 03/01/2022    4:10 AM 02/28/2022   12:16 AM  Last 3 Weights  Weight (lbs) 195 lb 8 oz 195 lb 3.2 oz 197 lb 12.8 oz  Weight (kg) 88.678 kg 88.542 kg 89.721 kg      Telemetry    N/a (refuses) - Personally Reviewed  ECG    No new tracing  Physical Exam   GEN: No acute distress.   Neck: No JVD Cardiac: Irreg Irreg, no murmurs, rubs, or gallops.  Respiratory: crackles in bases L>R GI: Soft, nontender, non-distended  MS: 1-2+ pitting LE edema; No deformity. Neuro:  Nonfocal  Psych: Normal affect   Labs    High Sensitivity Troponin:   Recent Labs  Lab 02/26/22 1253 02/26/22 1655  TROPONINIHS 40* 45*     Chemistry Recent Labs  Lab 02/26/22 1229 02/27/22 0440  02/28/22 0040 03/01/22 0627 03/02/22 0032  NA 137 138 137 137 135  K 3.5 3.4* 3.7 3.6 3.6  CL 102 100 102 102 100  CO2 '22 25 24 27 27  '$ GLUCOSE 150* 226* 228* 231* 279*  BUN '12 16 18 16 18  '$ CREATININE 1.02 1.27* 1.00 0.95 1.10  CALCIUM 8.6* 8.8* 8.6* 8.5* 8.7*  MG  --  1.4* 1.7  --  1.6*  PROT 6.7 7.2  --   --   --   ALBUMIN 3.4* 3.6  --   --   --   AST 22 19  --   --   --   ALT 13 12  --   --   --   ALKPHOS 64 62  --   --   --   BILITOT 0.8 0.7  --   --   --   GFRNONAA >60 60* >60 >60 >60  ANIONGAP '13 13 11 8 8    '$ Lipids No results for input(s): "CHOL", "TRIG", "HDL", "LABVLDL", "LDLCALC", "CHOLHDL" in the last 168 hours.  Hematology Recent Labs  Lab 02/26/22  1229 02/27/22 0440  WBC 2.7* 2.3*  RBC 2.96* 3.05*  HGB 9.5* 9.9*  HCT 29.7* 31.1*  MCV 100.3* 102.0*  MCH 32.1 32.5  MCHC 32.0 31.8  RDW 18.8* 18.8*  PLT 223 236   Thyroid No results for input(s): "TSH", "FREET4" in the last 168 hours.  BNP Recent Labs  Lab 02/26/22 1229  BNP 373.4*    DDimer No results for input(s): "DDIMER" in the last 168 hours.   Radiology    DG CHEST PORT 1 VIEW  Result Date: 03/01/2022 CLINICAL DATA:  [id number] Dyspnea [id number] EXAM: PORTABLE CHEST 1 VIEW COMPARISON:  None Available. FINDINGS: The heart and mediastinal contours are within normal limits. Aortic valve replacement. Atrial appendage clip. Atherosclerotic plaque of the aorta. No focal consolidation. No pulmonary edema. Small to moderate volume left pleural effusion. No pneumothorax. No acute osseous abnormality. IMPRESSION: 1.  Small to moderate volume left pleural effusion. 2.  Aortic Atherosclerosis (ICD10-I70.0). Electronically Signed   By: Iven Finn M.D.   On: 03/01/2022 21:40   IR THORACENTESIS ASP PLEURAL SPACE W/IMG GUIDE  Result Date: 02/28/2022 INDICATION: LEFT pleural effusion. EXAM: ULTRASOUND GUIDED DIAGNOSTIC AND THERAPEUTIC LEFT THORACENTESIS MEDICATIONS: None. COMPLICATIONS: None immediate. PROCEDURE: An  ultrasound guided thoracentesis was thoroughly discussed with the patient and/or patient's representative and questions answered. The benefits, risks, alternatives and complications were also discussed. The patient understands and wishes to proceed with the procedure. Written consent was obtained. Ultrasound was performed to localize and mark an adequate pocket of fluid in the LEFT chest. The area was then prepped and draped in the normal sterile fashion. 1% Lidocaine was used for local anesthesia. Under ultrasound guidance a 6 Fr Safe-T-Centesis catheter was introduced. Thoracentesis was performed. The catheter was removed and a dressing applied. FINDINGS: A total of approximately 1300 mL of serous pleural fluid was removed. Samples were sent to the laboratory as requested by the clinical team. IMPRESSION: Successful ultrasound guided diagnostic and therapeutic LEFT thoracentesis yielding 1300 mL of pleural fluid. Michaelle Birks, MD [facility] Radiology Electronically Signed   By: Michaelle Birks M.D.   On: 02/28/2022 19:25   DG Chest 1 View  Result Date: 02/28/2022 CLINICAL DATA:  Right thoracentesis. EXAM: CHEST  1 VIEW COMPARISON:  02/26/2022 FINDINGS: The cardio pericardial silhouette is enlarged. There is pulmonary vascular congestion without overt pulmonary edema. Left base collapse/consolidation with small to moderate left pleural effusion is similar to prior. No evidence for right-sided pneumothorax after thoracentesis. Prominent skin fold noted over the lateral right lung base. Telemetry leads overlie the chest. IMPRESSION: 1. No evidence for right-sided pneumothorax after thoracentesis. 2. Persistent left base collapse/consolidation with small to moderate left pleural effusion. Electronically Signed   By: Misty Stanley M.D.   On: 02/28/2022 14:41   ECHOCARDIOGRAM COMPLETE  Result Date: 02/28/2022    ECHOCARDIOGRAM REPORT   Patient Name:   Mark Cabrera  Date of Exam: 02/28/2022 Medical Rec #:  [id number]     Height:       68.0 in Accession #:    [id number]    Weight:       197.8 lb Date of Birth:  [birth year]     BSA:          2.034 m Patient Age:    71 years      BP:           103/68 mmHg Patient Gender: M  HR:           99 bpm. Exam Location:  Inpatient Procedure: 2D Echo, Color Doppler, Cardiac Doppler and Intracardiac            Opacification Agent Indications:    Dyspnea  History:        Patient has prior history of Echocardiogram examinations. CHF,                 CAD, Prior CABG, Stroke, AS, Arrythmias:Atrial Fibrillation,                 Signs/Symptoms:Dyspnea; Risk Factors:Diabetes, Hypertension,                 Dyslipidemia and Sleep Apnea. Hx of pericardial effusion s/p                 pericardial subxyphoid window.  Sonographer:    Eartha Inch Referring Phys: Christiana Fuchs  Sonographer Comments: Technically difficult study due to poor echo windows. Image acquisition challenging due to patient body habitus and Image acquisition challenging due to respiratory motion. IMPRESSIONS  1. Left ventricular ejection fraction, by estimation, is 30 to 35%. The left ventricle has moderately decreased function. The left ventricle demonstrates regional wall motion abnormalities (see scoring diagram/findings for description). Left ventricular  diastolic function could not be evaluated.  2. Right ventricular systolic function is moderately reduced. The right ventricular size is normal. There is mildly elevated pulmonary artery systolic pressure. The estimated right ventricular systolic pressure is 85.4 mmHg.  3. Left atrial size was mild to moderately dilated.  4. The mitral valve is grossly normal. Moderate mitral valve regurgitation. No evidence of mitral stenosis.  5. The aortic valve is tricuspid. Aortic valve regurgitation is not visualized. No aortic stenosis is present.  6. The inferior vena cava is dilated in size with <50% respiratory variability,  suggesting right atrial pressure of 15 mmHg. Comparison(s): Changes from prior study are noted. The left ventricular function is significantly worse. FINDINGS  Left Ventricle: Left ventricular ejection fraction, by estimation, is 30 to 35%. The left ventricle has moderately decreased function. The left ventricle demonstrates regional wall motion abnormalities. Definity contrast agent was given IV to delineate the left ventricular endocardial borders. The left ventricular internal cavity size was normal in size. There is no left ventricular hypertrophy. Left ventricular diastolic function could not be evaluated due to atrial fibrillation. Left ventricular diastolic function could not be evaluated.  LV Wall Scoring: The apical septal segment and apex are akinetic. Right Ventricle: The right ventricular size is normal. No increase in right ventricular wall thickness. Right ventricular systolic function is moderately reduced. There is mildly elevated pulmonary artery systolic pressure. The tricuspid regurgitant velocity is 2.41 m/s, and with an assumed right atrial pressure of 15 mmHg, the estimated right ventricular systolic pressure is 62.7 mmHg. Left Atrium: Left atrial size was mild to moderately dilated. Right Atrium: Right atrial size was normal in size. Pericardium: Trivial pericardial effusion is present. Mitral Valve: The mitral valve is grossly normal. Moderate mitral valve regurgitation. No evidence of mitral valve stenosis. MV peak gradient, 8.1 mmHg. The mean mitral valve gradient is 4.0 mmHg. Tricuspid Valve: The tricuspid valve is grossly normal. Tricuspid valve regurgitation is mild . No evidence of tricuspid stenosis. Aortic Valve: The aortic valve is tricuspid. Aortic valve regurgitation is not visualized. No aortic stenosis is present. Aortic valve mean gradient measures 10.0 mmHg. Aortic valve peak gradient measures 16.5 mmHg. Pulmonic Valve: The pulmonic valve was  grossly normal. Pulmonic valve  regurgitation is trivial. No evidence of pulmonic stenosis. Aorta: The aortic root and ascending aorta are structurally normal, with no evidence of dilitation. Venous: The inferior vena cava is dilated in size with less than 50% respiratory variability, suggesting right atrial pressure of 15 mmHg. IAS/Shunts: The atrial septum is grossly normal. Additional Comments: There is a small pleural effusion in the left lateral region.  LEFT VENTRICLE PLAX 2D LVIDd:         5.20 cm      Diastology LVIDs:         4.70 cm      LV e' medial:    2.50 cm/s LV PW:         0.90 cm      LV E/e' medial:  53.2 LV IVS:        1.10 cm      LV e' lateral:   8.25 cm/s                             LV E/e' lateral: 16.1  LV Volumes (MOD) LV vol d, MOD A2C: 177.0 ml LV vol d, MOD A4C: 160.0 ml LV vol s, MOD A2C: 127.0 ml LV vol s, MOD A4C: 86.5 ml LV SV MOD A2C:     50.0 ml LV SV MOD A4C:     160.0 ml LV SV MOD BP:      66.4 ml RIGHT VENTRICLE            IVC RV S prime:     6.75 cm/s  IVC diam: 2.80 cm TAPSE (M-mode): 0.7 cm LEFT ATRIUM              Index        RIGHT ATRIUM           Index LA diam:        5.60 cm  2.75 cm/m   RA Area:     20.20 cm LA Vol (A2C):   112.0 ml 55.06 ml/m  RA Volume:   57.60 ml  28.32 ml/m LA Vol (A4C):   74.9 ml  36.82 ml/m LA Biplane Vol: 92.1 ml  45.28 ml/m  AORTIC VALVE AV Vmax:           203.00 cm/s AV Vmean:          150.000 cm/s AV VTI:            0.350 m AV Peak Grad:      16.5 mmHg AV Mean Grad:      10.0 mmHg LVOT Vmax:         89.40 cm/s LVOT Vmean:        64.600 cm/s LVOT VTI:          0.174 m LVOT/AV VTI ratio: 0.50  AORTA Ao Asc diam: 3.80 cm MITRAL VALVE                  TRICUSPID VALVE MV Area (PHT): 4.29 cm       TR Peak grad:   23.2 mmHg MV Peak grad:  8.1 mmHg       TR Mean grad:   16.0 mmHg MV Mean grad:  4.0 mmHg       TR Vmax:        241.00 cm/s MV Vmax:       1.42 m/s       TR Vmean:  194.0 cm/s MV Vmean:      91.2 cm/s MV Decel Time: 177 msec       SHUNTS MR Peak grad:    89.1  mmHg    Systemic VTI: 0.17 m MR Mean grad:    57.0 mmHg MR Vmax:         472.00 cm/s MR Vmean:        350.0 cm/s MR PISA:         3.08 cm MR PISA Eff ROA: 23 mm MR PISA Radius:  0.70 cm MV E velocity: 133.00 cm/s Eleonore Chiquito MD Electronically signed by Eleonore Chiquito MD Signature Date/Time: 02/28/2022/12:21:05 PM    Final     Cardiac Studies   Echocardiogram 02/28/22  1. Left ventricular ejection fraction, by estimation, is 30 to 35%. The  left ventricle has moderately decreased function. The left ventricle  demonstrates regional wall motion abnormalities (see scoring  diagram/findings for description). Left ventricular   diastolic function could not be evaluated.   2. Right ventricular systolic function is moderately reduced. The right  ventricular size is normal. There is mildly elevated pulmonary artery  systolic pressure. The estimated right ventricular systolic pressure is  01.6 mmHg.   3. Left atrial size was mild to moderately dilated.   4. The mitral valve is grossly normal. Moderate mitral valve  regurgitation. No evidence of mitral stenosis.   5. The aortic valve is tricuspid. Aortic valve regurgitation is not  visualized. No aortic stenosis is present.   6. The inferior vena cava is dilated in size with <50% respiratory  variability, suggesting right atrial pressure of 15 mmHg.   Comparison(s): Changes from prior study are noted. The left ventricular  function is significantly worse.   Patient Profile     71 y.o. male with a hx of CAD s/p CABG (2018), severe AS s/p AVR 2018, HTN, HLD, paroxysmal atrial fibrillation, history of CVA who is being seen 03/01/2022 for the evaluation of CHF at the request of Dr. Jimmye Norman.   Assessment & Plan    Acute on chronic HFrEF  History of pericardial effusion in 2018, s/p pericardial window  Bilateral Pleural Effusions  -- Echocardiogram this admission showed EF 30-35% with regional wall motion abnormalities (akinesis of the septal  segment and apex), moderately reduced RV systolic function  -- Patient presented with SOB, ankle edema. BNP 373.4. CT chest with moderate bilateral pleural effusions  -- s/p L thoracentesis 12/19 that yielded 1300cc. CXR yesterday after procedure with small-moderate left pleural effusion  -- s/p IV lasix, now on PO lasix '40mg'$  BID, UOP 1L yesterday, weight stable. CXR 12/20 with persistent small to moderate left sided pleural effusion. Planned for repeat thoracentesis today  -- GDMT: continue metoprolol succinate 12.5 mg daily, spironolactone 12.5 mg daily. BP soft but tolerating -- plan for medical management given multiple co-morbidities   CAD s/p CABG SVG-PDA and SVG-OM1 on 03/30/16 --  recent cath 01/10/19 with occlusion of the SVG-OM, severe stenosis of the SVG-PDA, and chronic total occlusion of the LAD. Treated with DES to mid LAD, DES to SVG-PD -- Not on ASA due to eliquis use -- Continue metoprolol  -- intolerant of statins, on repatha    Paroxysmal Atrial Fibrillation  -- Patient presented in afib, HR well controlled -- Not on telemetry (refuses), but HR has been in the 70s-90s on vitals -- Continue eliquis 5 mg BID, metoprolol   S/p AVR -- surgical aortic valve replacement in 2018 -- Echocardiogram this admission showed  no aortic stenosis or regurgitation    Otherwise per primary  Chronic leukopenia  Anemia Cognitive impairment  Depression  Venous stasis dermatitis   For questions or updates, please contact [place] Please consult [email or web address] for contact info under    Signed, Reino Bellis, NP  03/02/2022, 10:45 AM    I have examined the patient and reviewed assessment and plan and discussed with patient.  Agree with above as stated.  COntinue diuresis.  Symptom management with thoracentesis as well.   Larae Grooms

## 2022-03-02 NOTE — Progress Notes (Addendum)
Subjective: The patient was woken up at the bedside for evaluation by the team. Unable to tell if his breathing is much different but says it is ok and also would be interested in another thoracentesis to get the L effusion resolved. Does not seem in distress in regards to his breathing today. Still having pain and tightness in his legs.  Objective:  Vital signs in last 24 hours: Vitals:   03/01/22 1614 03/01/22 1928 03/02/22 0537 03/02/22 1049  BP: (!) 149/81 108/78 (!) 145/54 111/67  Pulse: 67 62 84 65  Resp:  '18 16 20  '$ Temp: 97.6 F (36.4 C) 97.6 F (36.4 C) (!) 97.4 F (36.3 C) (!) 97.5 F (36.4 C)  TempSrc: Axillary Oral Oral Oral  SpO2: 100% 100% 98% 100%  Weight:   88.7 kg   Height:       Gen: Well developed, well nourishes, laying in bed calmly, no distress Cardiovascular: Regular rate, irregularly irregular rhythm. No murmurs, rubs, or gallops. 2+ radial and PT pulses bilaterally.  Pulmonary:Normal WOB, absent breath sounds at the L lower lung base,interval resolution of crackles at the R lung base  Abdominal: Soft, NT, ND, normal active bowel sounds  Neurological:alert and attentive Skin: warm, dry, 2+ bilateral pitting edema, non-blanching erythema of the bilateral LEs improving on the L but still apparent on the right similar to prior days  Assessment/Plan:  Principal Problem:   Acute exacerbation of CHF (congestive heart failure) (HCC)  Mark Cabrera is a 71 y/o male with past medical history of T2DM, HLD, HTN, HFpEF (EF 50-55% in 09/2021), CAD s/p CABG, afib, AVR, dementia, compensated cirrhosis, and CVA that presents with worsening shortness of breath and lower extremity swelling and admitted for profound subjective dyspnea, found to have new HFrEF being medically managed with GDMT, L>R pleural effusion doing better after R thoracentesis and awaiting L thoracentesis.   Acute HFrEF exacerbationAS s/p AVR L>R pleural effusion s/p R thoracentesis  Overnight endorsing  dyspnea an CXR showing interstitial edema and L pleural effusion consistent with priors.Satting 99% on RA but still having  subjective dyspnea although seems to be doing better since R thoracentesis per IR. On exam patient continues to have loss of breath sounds at the L lung base consistent with effusion, 2+ bilateral pitting edema of the shins.  Now has  crackles at the R base on lung exam but has absent lung sounds at the L lung base consistent with his persistent L moderate pleural effusion. Patient is open to L thoracentesis which has been ordered per IR.Will also ensure his compression stockings are applied. Tolerating metoprolol and spironolactone well, but limited on SGLT2 due to A1c>14 and ARNI/ARB due to softer BP. Will continue with diuresis today. -IV lasix 40 mg -Lasix '40mg'$  BID oral -Spironolactone 12.5 mg daily -metoprolol 12.5 mg daily -Tylenol for pain -Strict Is/Os  Venous stasis dermatitis Persistent 2+ bilateral pitting edema of the legs and bilateral venous stasis changes, although the LLE dermatitis has had interval improvement since admission. Compression stockings not in place on evaluation this AM, will continue to follow this up for placement. -compression stockings   CAD s/p CABGAfibCVA A-fib without RVR.  -Eliquis 5 mg BID -metoprolol 12.'5mg'$  daily   T2DM A1c > 14 1 month ago. Patient receives soliqua 40 units at bedtime, Novolog 12 units TID w/ meals, and Metformin 500 mg BID at his facility. Day time CBGs in the low 100s, will not modify insulin but restart home metformin. -5u novalog TID -  Semglee increased to 28 units -Start metformin 500 BID   HLD  Holding Repatha currently. Will likely restart upon discharge.    Chronic LeukopeniaAnemia CBC demonstrates leukopenia of 2.7 on admission. Chronic but stable.  -Trend CBC   Cognitive impairment Depression A&O x4, patient appears to have decision making capacity for thoracentesis discussed with him  today. -Escitalopram 10 mg daily for depression  -Continue to hold and d/c at discharge the hydroxyzine 10 mg/5 mL syrup (5 mL TID w/ meals PRN; anticholinergic, should be avoided particularly in cognitive impairment)  Prior to Admission Living Arrangement: Anticipated Discharge Location: Barriers to Discharge: Dispo: Anticipated discharge in approximately 1 day(s).   Iona Coach, MD 03/02/2022, 11:22 AM Pager: [phone] After 5pm on weekdays and 1pm on weekends: On Call pager [phone]

## 2022-03-03 DIAGNOSIS — J90 Pleural effusion, not elsewhere classified: Secondary | ICD-10-CM | POA: Diagnosis not present

## 2022-03-03 DIAGNOSIS — I5030 Unspecified diastolic (congestive) heart failure: Secondary | ICD-10-CM | POA: Diagnosis not present

## 2022-03-03 DIAGNOSIS — I10 Essential (primary) hypertension: Secondary | ICD-10-CM | POA: Diagnosis not present

## 2022-03-03 DIAGNOSIS — Z743 Need for continuous supervision: Secondary | ICD-10-CM | POA: Diagnosis not present

## 2022-03-03 DIAGNOSIS — I509 Heart failure, unspecified: Secondary | ICD-10-CM | POA: Diagnosis not present

## 2022-03-03 DIAGNOSIS — R531 Weakness: Secondary | ICD-10-CM | POA: Diagnosis not present

## 2022-03-03 DIAGNOSIS — R451 Restlessness and agitation: Secondary | ICD-10-CM | POA: Diagnosis not present

## 2022-03-03 DIAGNOSIS — R456 Violent behavior: Secondary | ICD-10-CM | POA: Diagnosis not present

## 2022-03-03 DIAGNOSIS — E782 Mixed hyperlipidemia: Secondary | ICD-10-CM | POA: Diagnosis not present

## 2022-03-03 DIAGNOSIS — I4891 Unspecified atrial fibrillation: Secondary | ICD-10-CM | POA: Diagnosis not present

## 2022-03-03 DIAGNOSIS — E1165 Type 2 diabetes mellitus with hyperglycemia: Secondary | ICD-10-CM | POA: Diagnosis not present

## 2022-03-03 DIAGNOSIS — I5033 Acute on chronic diastolic (congestive) heart failure: Secondary | ICD-10-CM | POA: Diagnosis not present

## 2022-03-03 DIAGNOSIS — I2510 Atherosclerotic heart disease of native coronary artery without angina pectoris: Secondary | ICD-10-CM | POA: Diagnosis not present

## 2022-03-03 DIAGNOSIS — K0889 Other specified disorders of teeth and supporting structures: Secondary | ICD-10-CM | POA: Diagnosis not present

## 2022-03-03 DIAGNOSIS — I480 Paroxysmal atrial fibrillation: Secondary | ICD-10-CM | POA: Diagnosis not present

## 2022-03-03 DIAGNOSIS — R4689 Other symptoms and signs involving appearance and behavior: Secondary | ICD-10-CM | POA: Diagnosis not present

## 2022-03-03 DIAGNOSIS — Z515 Encounter for palliative care: Secondary | ICD-10-CM | POA: Diagnosis not present

## 2022-03-03 DIAGNOSIS — G4730 Sleep apnea, unspecified: Secondary | ICD-10-CM | POA: Diagnosis not present

## 2022-03-03 DIAGNOSIS — I872 Venous insufficiency (chronic) (peripheral): Secondary | ICD-10-CM | POA: Diagnosis not present

## 2022-03-03 DIAGNOSIS — R2689 Other abnormalities of gait and mobility: Secondary | ICD-10-CM | POA: Diagnosis not present

## 2022-03-03 DIAGNOSIS — K7469 Other cirrhosis of liver: Secondary | ICD-10-CM | POA: Diagnosis not present

## 2022-03-03 DIAGNOSIS — I5031 Acute diastolic (congestive) heart failure: Secondary | ICD-10-CM | POA: Diagnosis not present

## 2022-03-03 DIAGNOSIS — R0600 Dyspnea, unspecified: Secondary | ICD-10-CM | POA: Diagnosis not present

## 2022-03-03 DIAGNOSIS — I110 Hypertensive heart disease with heart failure: Secondary | ICD-10-CM | POA: Diagnosis not present

## 2022-03-03 DIAGNOSIS — F01518 Vascular dementia, unspecified severity, with other behavioral disturbance: Secondary | ICD-10-CM | POA: Diagnosis not present

## 2022-03-03 DIAGNOSIS — Z79899 Other long term (current) drug therapy: Secondary | ICD-10-CM | POA: Diagnosis not present

## 2022-03-03 DIAGNOSIS — I25810 Atherosclerosis of coronary artery bypass graft(s) without angina pectoris: Secondary | ICD-10-CM | POA: Diagnosis not present

## 2022-03-03 DIAGNOSIS — K5900 Constipation, unspecified: Secondary | ICD-10-CM | POA: Diagnosis not present

## 2022-03-03 DIAGNOSIS — F0390 Unspecified dementia without behavioral disturbance: Secondary | ICD-10-CM | POA: Diagnosis not present

## 2022-03-03 DIAGNOSIS — Z7401 Bed confinement status: Secondary | ICD-10-CM | POA: Diagnosis not present

## 2022-03-03 DIAGNOSIS — F419 Anxiety disorder, unspecified: Secondary | ICD-10-CM | POA: Diagnosis not present

## 2022-03-03 DIAGNOSIS — I482 Chronic atrial fibrillation, unspecified: Secondary | ICD-10-CM | POA: Diagnosis not present

## 2022-03-03 DIAGNOSIS — F22 Delusional disorders: Secondary | ICD-10-CM | POA: Diagnosis not present

## 2022-03-03 DIAGNOSIS — R1110 Vomiting, unspecified: Secondary | ICD-10-CM | POA: Diagnosis not present

## 2022-03-03 DIAGNOSIS — B372 Candidiasis of skin and nail: Secondary | ICD-10-CM | POA: Diagnosis not present

## 2022-03-03 DIAGNOSIS — Z951 Presence of aortocoronary bypass graft: Secondary | ICD-10-CM | POA: Diagnosis not present

## 2022-03-03 DIAGNOSIS — R4189 Other symptoms and signs involving cognitive functions and awareness: Secondary | ICD-10-CM | POA: Diagnosis not present

## 2022-03-03 DIAGNOSIS — B3742 Candidal balanitis: Secondary | ICD-10-CM | POA: Diagnosis not present

## 2022-03-03 DIAGNOSIS — E1142 Type 2 diabetes mellitus with diabetic polyneuropathy: Secondary | ICD-10-CM | POA: Diagnosis not present

## 2022-03-03 DIAGNOSIS — R404 Transient alteration of awareness: Secondary | ICD-10-CM | POA: Diagnosis not present

## 2022-03-03 DIAGNOSIS — Z046 Encounter for general psychiatric examination, requested by authority: Secondary | ICD-10-CM | POA: Diagnosis present

## 2022-03-03 DIAGNOSIS — M6281 Muscle weakness (generalized): Secondary | ICD-10-CM | POA: Diagnosis not present

## 2022-03-03 DIAGNOSIS — R296 Repeated falls: Secondary | ICD-10-CM | POA: Diagnosis not present

## 2022-03-03 DIAGNOSIS — R293 Abnormal posture: Secondary | ICD-10-CM | POA: Diagnosis not present

## 2022-03-03 DIAGNOSIS — K7460 Unspecified cirrhosis of liver: Secondary | ICD-10-CM | POA: Diagnosis not present

## 2022-03-03 DIAGNOSIS — E119 Type 2 diabetes mellitus without complications: Secondary | ICD-10-CM | POA: Diagnosis not present

## 2022-03-03 DIAGNOSIS — F411 Generalized anxiety disorder: Secondary | ICD-10-CM | POA: Diagnosis not present

## 2022-03-03 LAB — BASIC METABOLIC PANEL
Anion gap: 8 (ref 5–15)
BUN: 19 mg/dL (ref 8–23)
CO2: 26 mmol/L (ref 22–32)
Calcium: 8.3 mg/dL — ABNORMAL LOW (ref 8.9–10.3)
Chloride: 106 mmol/L (ref 98–111)
Creatinine, Ser: 0.99 mg/dL (ref 0.61–1.24)
GFR, Estimated: >60 mL/min (ref >60)
Glucose, Bld: 134 mg/dL — ABNORMAL HIGH (ref 70–99)
Potassium: 3.2 mmol/L — ABNORMAL LOW (ref 3.5–5.1)
Sodium: 140 mmol/L (ref 135–145)

## 2022-03-03 LAB — GLUCOSE, CAPILLARY
Glucose-Capillary: 88 mg/dL (ref 70–99)
Glucose-Capillary: 81 mg/dL (ref 70–99)

## 2022-03-03 LAB — GRAM STAIN

## 2022-03-03 LAB — MAGNESIUM: Magnesium: 1.6 mg/dL — ABNORMAL LOW (ref 1.7–2.4)

## 2022-03-03 MED ORDER — SPIRONOLACTONE 25 MG PO TABS: 12.5000 mg | ORAL_TABLET | Freq: Every day | ORAL | Status: DC

## 2022-03-03 MED ORDER — NOVOLOG FLEXPEN RELION 100 UNIT/ML ~~LOC~~ SOPN: PEN_INJECTOR | SUBCUTANEOUS | 0 refills | Status: DC

## 2022-03-03 MED ORDER — POTASSIUM CHLORIDE CRYS ER 20 MEQ PO TBCR
40.0000 meq | EXTENDED_RELEASE_TABLET | Freq: Four times a day (QID) | ORAL | Status: DC
  Administered 2022-03-03: 40 meq via ORAL
  Filled 2022-03-03: qty 2

## 2022-03-03 MED ORDER — MAGNESIUM SULFATE 2 GM/50ML IV SOLN
2.0000 g | Freq: Once | INTRAVENOUS | Status: AC
  Administered 2022-03-03: 2 g via INTRAVENOUS
  Filled 2022-03-03: qty 50

## 2022-03-03 MED ORDER — INSULIN GLARGINE-YFGN 100 UNIT/ML ~~LOC~~ SOLN: 26.0000 [IU] | Freq: Every day | SUBCUTANEOUS | 11 refills | Status: DC

## 2022-03-03 NOTE — Discharge Summary (Cosign Needed Addendum)
Name: Mark Cabrera MRN: [id number] DOB: 03/16/1950 71 y.o. PCP: [name], MD  Date of Admission: 02/26/2022 12:15 PM Date of Discharge: 03/03/2022 1:14PM Attending Physician: Angelica Pou, MD  Discharge Diagnosis: 1. Principal Problem:   Acute on chronic diastolic heart failure with preserved ejection fraction (HCC) Active Problems:   Paroxysmal atrial fibrillation (HCC)   Pleural effusion, left   Hepatic cirrhosis (HCC)   Cognitive impairment with history of delirium   Acute kidney injury ([place])   Acute venous stasis dermatitis of both lower extremities   Acute exacerbation of CHF (congestive heart failure) ([place])    Discharge Medications: Allergies as of 03/03/2022       Reactions   Morpholine Salicylate Other (See Comments)   Hallucinations   Penicillins Other (See Comments)   Allergic- reaction?? Has patient had a PCN reaction causing immediate rash, facial/tongue/throat swelling, SOB or lightheadedness with hypotension: Unk Has patient had a PCN reaction causing severe rash involving mucus membranes or skin necrosis: Unk Has patient had a PCN reaction that required hospitalization: Unk Has patient had a PCN reaction occurring within the last 10 years: No If all of the above answers are "NO", then may proceed with Cephalosporin use.   Morphine And Related Other (See Comments)   Hallucinations   Sglt2 Inhibitors Rash, Other (See Comments)   Candidiasis infection prone   Statins Rash, Other (See Comments)   Severe rash and back pain, and muscle pain        Medication List     STOP taking these medications    hydrOXYzine 10 MG/5ML syrup Commonly known as: ATARAX   potassium chloride SA 20 MEQ tablet Commonly known as: KLOR-CON M   Soliqua 100-33 UNT-MCG/ML Sopn Generic drug: Insulin Glargine-Lixisenatide       TAKE these medications    Accu-Chek Guide test strip Generic drug: glucose blood Use as instructed   Accu-Chek Softclix  Lancets lancets Use as instructed   acetaminophen 325 MG tablet Commonly known as: TYLENOL Take 2 tablets (650 mg total) by mouth every 4 (four) hours as needed for headache or mild pain.   benzocaine 10 % mucosal gel Commonly known as: ORAJEL Use as directed 1 Application in the mouth or throat 4 (four) times daily as needed for mouth pain.   cyanocobalamin 100 MCG tablet Commonly known as: VITAMIN B12 Take 100 mcg by mouth daily.   Eliquis 5 MG Tabs tablet Generic drug: apixaban Take 1 tablet by mouth twice daily What changed: how much to take   escitalopram 10 MG tablet Commonly known as: LEXAPRO Take 1 tablet (10 mg total) by mouth daily.   furosemide 40 MG tablet Commonly known as: LASIX Take 40 mg by mouth 2 (two) times daily.   insulin glargine-yfgn 100 UNIT/ML injection Commonly known as: SEMGLEE Inject 0.26 mLs (26 Units total) into the skin at bedtime.   Insulin Pen Needle 31G X 5 MM Misc Use one pen needle three times daily. Dx E11.49   ipratropium-albuterol 0.5-2.5 (3) MG/3ML Soln Commonly known as: DUONEB Inhale 3 mLs into the lungs every 6 (six) hours as needed (for shortness of breath).   metFORMIN 500 MG tablet Commonly known as: Glucophage Take 1 tablet (500 mg total) by mouth 2 (two) times daily with a meal.   metoprolol succinate 25 MG 24 hr tablet Commonly known as: TOPROL-XL Take 0.5 tablets (12.5 mg total) by mouth daily.   NovoLOG FlexPen 100 UNIT/ML FlexPen Generic drug: insulin aspart INJECT 5  UNITS UNDER THE SKIN THREE TIMES DAILY WITH MEALS What changed: additional instructions   polyethylene glycol 17 g packet Commonly known as: MIRALAX / GLYCOLAX Take 17 g by mouth daily.   Repatha SureClick 160 MG/ML Soaj Generic drug: Evolocumab INJECT 1 PEN INTO THE SKIN EVERY 14 (FOURTEEN) DAYS.   spironolactone 25 MG tablet Commonly known as: ALDACTONE Take 0.5 tablets (12.5 mg total) by mouth daily. Start taking on: March 04, 2022    valACYclovir 1000 MG tablet Commonly known as: VALTREX Take 1,000 mg by mouth 2 (two) times daily.        Disposition and follow-up:   [name] was discharged from [facility] in Stable condition.  At the hospital follow up visit please address:  1.  Acute HFrEF exacerbation -consider additon of ARNI/ARB as tolerated, although BP in hospital ahs been soft -Titrate metoprolol 12.5 and spiro 12.5 as tolerated -follow up BMP and Mg in 1 week -out patient palliative care order placed  Venous stasis dermatitis -please ensure he is taking lasix '40mg'$  PO BID. Will need to keep an eye on renal function and electrolytes and lower the dose/ discontinue lasix if these occur. -Please consider compression stockings  T2DM Held soliqua 40 units at bedtime and discontinued at discharge. Novolog decreased to 5 units TID w/ meals at bedtime.Restarted Metformin 500 mg BID and continued at discharge. During admission semglee titrated up to 28u daily but with the addition of metformin AM glucose day of discharge was 88, so semglee decreased to 26 units at discharge. Titrate as needed.  DNR Signed at discharge  2.  Labs / imaging needed at time of follow-up: BMP, Mg  3.  Pending labs/ test needing follow-up: NA  Follow-up Appointments:  Follow-up Information     Swinyer, Lanice Schwab, NP Follow up on 03/09/2022.   Specialty: Cardiology Why: Cardiology follow up, '@1'$ :30PM. Contact information: [place] [address] [phone]                03/09/2022 1:30 PM (Arrive by 1:15 PM) Swinyer, Lanice Schwab, NP [facility] [place] Course by problem list: 1. Acute HFrEF exacerbationAS s/p AVR L>R pleural effusion s/p R thoracentesis  Patient presented with non-hypoxic dyspnea and found to have L>R pleural effusions. Echo performed showing new HFrEF with LVEf 30-35% and new focal wall  motion abnormailities since 09/2021. IR performed thoracentesis on what was determined to be bilateral transudative effusions, 1.3L drained from each. Patient had cardiology consult and not a candidate for catheterization, restarted on home metoprolol and also started on spironolactone for GDMT. BP remained 626-948N systolic but no room for other GDMT. Also not a candidate for SGLT2 given A1c >14.Patient received IV lasix and was also discharged on oral lasix. Kidney function remained stable. Admission weight 89.1, discharge weight 88.5KG.   Venous stasis dermatitis Persistent 2+ bilateral pitting edema of the legs and bilateral venous stasis changes, although the LLE dermatitis has had interval improvement since admission. Initially treated with 2 says of cefalexin for c/f cellulitis.Compression stockings ordered multiple times but not placed, will be ordered at discharge.   CAD s/p CABGAfibCVA A-fib without RVR during admission. Continued Eliquis 5 mg BID and restarted metoprolol 12.'5mg'$  daily once past initial acute HF exacerbation.   T2DM A1c > 14 1 month ago. Patient receives soliqua 40 units at bedtime, Novolog 12 units TID w/ meals,  and Metformin 500 mg BID at his facility. During admission semglee titrated up to 28u dailyDuring admission semglee titrated up to 28u daily but with the addition of metformin AM glucose day of discharge was 88, so semglee decreased to 26 units at discharge. Novalog titrated to '5mg'$  TID with meals and SSI, metformin 554m BID restarted with good glycemic control.   HLD  Holding Repatha currently. Will likely restart upon discharge.     Chronic LeukopeniaAnemia CBC demonstrates leukopenia of 2.7 on admission. Chronic but stable throughout admission.   Cognitive impairment Depression A&O x4 throughout admission and attentive. Seemed intermittently confused at times possibly consistent with delirium but his baseline is not known. Continued Escitalopram 10 mg daily  for depression.Held and d/c at discharge the hydroxyzine 10 mg/5 mL syrup .  DNR Signed at discharge  Subjective The patient woke up from sleep for evaluation at the bedside. Says he has suome subjective dyspnea improved with oxygen. Overall comfortable and nothing else to do for him. Thinks his legs feel a little better when he looks at them and the team examines him. He is ok with leaving the hospital and is amenable to returning to [facility]. Discharge Exam:   BP 121/82 (BP Location: Right Arm)   Pulse 61   Temp 98.7 F (37.1 C) (Oral)   Resp 20   Ht '5\' 8"'$  (1.727 m)   Wt 85.2 kg   SpO2 100%   BMI 28.55 kg/m  Discharge exam:  Gen: Well developed, well nourishes, laying in bed calmly, no distress Cardiovascular: Regular rate, irregularly irregular rhythm. No murmurs, rubs, or gallops. 2+ radial and PT pulses bilaterally. No JVD but there is appreciable jugular fullness  Pulmonary:Normal WOB, absent breath sounds at the L lower lung base,interval resolution of crackles at the R lung base  Abdominal: Soft, NT, ND, normal active bowel sounds  Neurological:alert and attentive Skin: warm, dry, 1-2+ L>R bilateral pitting edema improved from prior days, non-blanching erythema of the bilateral LEs improving on the R but still apparent on the L with overlying pretibial sore with black scabbing/eschar, wrinkling of the skin noted  Pertinent Labs, Studies, and Procedures:     Latest Ref Rng & Units 02/27/2022    4:40 AM 02/26/2022   12:29 PM 01/23/2022    2:39 AM  CBC  WBC 4.0 - 10.5 K/uL 2.3  2.7  2.4   Hemoglobin 13.0 - 17.0 g/dL 9.9  9.5  8.5   Hematocrit 39.0 - 52.0 % 31.1  29.7  25.2   Platelets 150 - 400 K/uL 236  223  198        Latest Ref Rng & Units 03/03/2022    7:36 AM 03/02/2022   12:32 AM 03/01/2022    6:27 AM  BMP  Glucose 70 - 99 mg/dL 134  279  231   BUN 8 - 23 mg/dL '19  18  16   '$ Creatinine 0.61 - 1.24 mg/dL 0.99  1.10  0.95   Sodium 135 - 145 mmol/L 140  135   137   Potassium 3.5 - 5.1 mmol/L 3.2  3.6  3.6   Chloride 98 - 111 mmol/L 106  100  102   CO2 22 - 32 mmol/L '26  27  27   '$ Calcium 8.9 - 10.3 mg/dL 8.3  8.7  8.5    CT CHEST WO CONTRAST  Result Date: 02/27/2022 CLINICAL DATA:  Dyspnea, chronic, chest wall or pleura disease suspected EXAM: CT CHEST WITHOUT CONTRAST TECHNIQUE: Multidetector  CT imaging of the chest was performed following the standard protocol without IV contrast. RADIATION DOSE REDUCTION: This exam was performed according to the departmental dose-optimization program which includes automated exposure control, adjustment of the mA and/or kV according to patient size and/or use of iterative reconstruction technique. COMPARISON:  CT chest 02/19/2020 FINDINGS: Cardiovascular: Normal heart size. No significant pericardial effusion. The thoracic aorta is normal in caliber. Moderate atherosclerotic plaque of the thoracic aorta. Four-vessel coronary artery calcifications. Aortic valve replacement. Atrial appendage clip. Mediastinum/Nodes: No enlarged mediastinal or axillary lymph nodes. Thyroid gland, trachea, and esophagus demonstrate no significant findings. Lungs/Pleura: Partial collapse of the left lower lobe. Vague scattered peripheral reticulations likely sequelae of prior infection status elation noted in 2021. No focal consolidation. Several scattered subpleural pulmonary micronodules. No pulmonary mass. Bilateral moderate pleural effusions. No pneumothorax. Upper Abdomen: Cirrhotic morphology of the liver. No acute abnormality. Musculoskeletal: No chest wall abnormality. No suspicious lytic or blastic osseous lesions. No acute displaced fracture. Moderate degenerative changes of bilateral shoulders. Multilevel degenerative changes of the spine. Healed prior sternotomy. Lipomatous lesion of the left shoulder anterior musculature (3:19). IMPRESSION: 1. Trace pulmonary fibrosis throughout the lungs likely due to prior COVID infection.  Superimposed developing patchy airspace opacities that could represent acute infection/inflammation not fully excluded. 2. Bilateral moderate pleural effusions. 3. Nonspecific scattered subpleural pulmonary micronodules. No follow-up needed if patient is low-risk (and has no known or suspected primary neoplasm). Non-contrast chest CT can be considered in 12 months if patient is high-risk. This recommendation follows the consensus statement: Guidelines for Management of Incidental Pulmonary Nodules Detected on CT Images: From the Fleischner Society 2017; Radiology 2017; 284:228-243. 4. Aortic Atherosclerosis (ICD10-I70.0) including four-vessel coronary artery calcification. Electronically Signed   By: Iven Finn M.D.   On: 02/27/2022 22:10   DG Chest 2 View  Result Date: 02/26/2022 CLINICAL DATA:  Shortness of breath. EXAM: CHEST - 2 VIEW COMPARISON:  01/17/2022 FINDINGS: Heart size is within normal limits. Prior CABG and aortic valve replacement again noted. Increased diffuse interstitial infiltrates are suspicious for mild interstitial edema. New small to moderate left pleural effusion and left lower lobe atelectasis or infiltrate are also seen. IMPRESSION: Increased diffuse interstitial infiltrates, suspicious for mild interstitial edema. New small to moderate left pleural effusion, and left lower lobe atelectasis or infiltrate. Electronically Signed   By: Marlaine Hind M.D.   On: 02/26/2022 13:26     Discharge Instructions: Discharge Instructions     Diet - low sodium heart healthy   Complete by: As directed    Increase activity slowly   Complete by: As directed      You were hospitalized because you were feeling short of breath. You were found to have fluid around your lungs called effusions. We did an echocardiogram, which takes a picture of the heart, and found your hear is not squeezing as well as it should. This is causing your leg swelling and shortness of breath. We had the cardiology  team evaluate you and they recommended medication management so we restarted your home metoprolol, started a new medication called spironolactone, as well as lasix which help you pee out extra fluid to prevent leg swelling or shortness of breath. These all help the heart. We also had our radiologists use a needle to drain the fluid from your lungs. You will see the cardiology doctors once you leave the hospital.  Signed: Iona Coach, MD 03/03/2022, 12:40 PM   Pager: [phone]

## 2022-03-03 NOTE — TOC Transition Note (Signed)
Transition of Care [facility]) - CM/SW Discharge Note   Patient Details  Name: Mark Cabrera MRN: [id number] Date of Birth: 08/07/1950  Transition of Care [facility]) CM/SW Contact:  Bethann Berkshire, [place] Phone Number: 03/03/2022, 2:21 PM   Clinical Narrative:     Patient will DC to: [facility] Anticipated DC date: 03/03/22 Family notified: Daughter/Guardian [name] Transport by: Corey Harold   Per MD patient ready for DC to [facility]. RN, patient, patient's family, and facility notified of DC. Discharge Summary and FL2 sent to facility. RN to call report prior to discharge [phone] Room ). DC packet on chart. Ambulance transport requested for patient.   CSW will sign off for now as social work intervention is no longer needed. Please consult [place] again if new needs arise.   Final next level of care: [place] Barriers to Discharge: No Barriers Identified   Patient Goals and CMS Choice      Discharge Placement                Patient chooses bed at: [facility] Patient to be transferred to facility by: [place] Name of family member notified: Rommie, Dunn (Daughter)  [phone] (Mobile) Patient and family notified of of transfer: 03/03/22  Discharge Plan and Services Additional resources added to the After Visit Summary for                                       Social Determinants of Health (SDOH) Interventions SDOH Screenings   Food Insecurity: No Food Insecurity (02/27/2022)  Recent Concern: [place] Present (01/21/2022)  Housing: High Risk (02/27/2022)  Transportation Needs: Unmet Transportation Needs (02/27/2022)  Utilities: Not At Risk (02/27/2022)  Recent Concern: Utilities - At Risk (01/21/2022)  Alcohol Screen: Low Risk  (01/18/2022)  Depression (PHQ2-9): Low Risk  (01/18/2022)  Financial Resource Strain: Medium Risk (01/18/2022)  Physical Activity: Inactive (01/18/2022)  Social Connections: Moderately  Isolated (01/18/2022)  Stress: Stress Concern Present (01/18/2022)  Tobacco Use: Low Risk  (03/02/2022)     Readmission Risk Interventions     No data to display

## 2022-03-03 NOTE — Discharge Instructions (Signed)
You were hospitalized because you were feeling short of breath. You were found to have fluid around your lungs called effusions. We did an echocardiogram, which takes a picture of the heart, and found your hear is not squeezing as well as it should. This is causing your leg swelling and shortness of breath. We had the cardiology team evaluate you and they recommended medication management so we restarted your home metoprolol, started a new medication called spironolactone, as well as lasix which help you pee out extra fluid to prevent leg swelling or shortness of breath. These all help the heart. We also had our radiologists use a needle to drain the fluid from your lungs. You will see the cardiology doctors once you leave the hospital.

## 2022-03-03 NOTE — Progress Notes (Signed)
   Heart Failure Stewardship Pharmacist Progress Note   PCP: [name], MD PCP-Cardiologist: Lauree Chandler, MD    HPI:  71 yo M with PMH of T2DM, HLD, HTN, CHF, cirrhosis, CAD s/p CABG and AVR, dementia, and CVA.  Presented to the ED from SNF on 12/17 with shortness of breath, LE edema, and elevated BNP. CXR with increased diffuse interstitial infiltrates, suspicious for mild interstitial edema. S/p L thoracentesis on 12/19, removing 1300 mL of fluid. ECHO 12/19 showed LVEF reduced to 30-35% (was 50-55% in 09/2021), regional wall motion abnormalities, RV moderately reduced. Cardiology consulted. Repeat thoracentesis 12/21 removed 1300 mL fluid.  Current HF Medications: Diuretic: furosemide 40 mg PO BID Beta Blocker: metoprolol XL 12.5 mg daily MRA: spironolactone 12.5 mg daily  Prior to admission HF Medications: Diuretic: furosemide 40 mg PO BID Beta blocker: metoprolol XL 12.5 mg daily  Pertinent Lab Values: Serum creatinine 0.99, BUN 19, Potassium 3.2, Sodium 140, BNP 1010>373.4, Magnesium 1.6, A1c >14   Vital Signs: Weight: 187 lbs (admission weight: 196 lbs) Blood pressure: 120/80s  Heart rate: 60s  I/O: -1L yesterday; net -1.5L  Assessment: 1. Acute on chronic systolic CHF (LVEF 63-78%), due to ICM. NYHA class II symptoms. - Continue furosemide 40 mg PO BID. Strict I/Os and daily weights. Keep K>4 and Mag>2. Recommend KCl 40 mEq x 2 and Magnesium 4g IV x 1 - Continue metoprolol XL 12.5 mg daily - Consider starting losartan 12.5 mg daily - Continue spironolactone 12.5 mg daily - No SGLT2i - high risk for UTI given A1c>14 and dementia   Plan: 1) Medication changes recommended at this time: - KCl 40 mEq x 2 - Magnesium 4g IV x 1 - Start losartan 12.5 mg daily  2) Patient assistance: - Returning to [place], PharmD, BCPS Heart Failure Stewardship Pharmacist Phone [phone]

## 2022-03-03 NOTE — Care Management Important Message (Signed)
Important Message  Patient Details  Name: Mark Cabrera MRN: [id number] Date of Birth: 09/19/1950   Medicare Important Message Given:  Yes     [name] 03/03/2022, 8:22 AM

## 2022-03-03 NOTE — NC FL2 (Signed)
[place] LEVEL OF CARE FORM     IDENTIFICATION  Patient Name: Mark Cabrera Birthdate: 1950/04/30 Sex: male Admission Date (Current Location): 02/26/2022  [facility] and [place] Number:  Herbalist and Address:  [facility], [place] [address], [place], [place] [address]      Provider Number: [id number]  Attending Physician Name and Address:  Angelica Pou, MD  Relative Name and Phone Number:  Raiquan, Chandler (Daughter)  [phone] [facility])    (Guardian)    Current Level of Care: Hospital Recommended Level of Care: [place] Prior Approval Number: [id number]  Date Approved/Denied:   PASRR Number:    Discharge Plan: SNF    Current Diagnoses: Patient Active Problem List   Diagnosis Date Noted   Acute exacerbation of CHF (congestive heart failure) ([place]) 02/28/2022   Acute venous stasis dermatitis of both lower extremities 02/27/2022   Acute on chronic diastolic heart failure with preserved ejection fraction ([place]) 02/26/2022   Pressure injury of skin 01/24/2022   Hyperglycemia 01/17/2022   Neutropenia (HCC)    Fever, unspecified    Malnutrition of moderate degree 09/23/2021   Physical deconditioning 09/21/2021   Otitis externa 12/31/2020   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   Balanitis 47/42/5956   Complicated urinary tract infection    Acute kidney injury ([place])    Thrombocytopenia ([place])    Hyponatremia 11/08/2020   Left shoulder pain 10/20/2020   Dyspnea 09/23/2020   Cognitive impairment with history of delirium 08/25/2020   Hip pain, chronic, left 07/05/2020   Epididymitis 04/14/2020   Fatigue 03/18/2020   Hypotension 03/10/2020   Abdominal wall bulge 12/03/2019   Dermoid inclusion cyst 08/07/2019   Chronic left shoulder pain 05/13/2019   Depression 11/05/2018   Blurry vision, bilateral 09/20/2018   CVA (cerebral vascular accident) ([place]) 06/28/2018   Hepatic cirrhosis ([place]) 06/29/2017   Healthcare  maintenance 12/16/2016   Lipoma of left upper extremity 09/12/2016   Chronic knee pain 07/04/2016   Decreased visual acuity 07/04/2016   Pleural effusion, left 04/28/2016   Paroxysmal atrial fibrillation (HCC) 04/16/2016   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/31/2016   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   CAD (coronary artery disease), native coronary artery    Chronic diastolic heart failure (HCC)    Sleep apnea 08/02/2009   Diabetes mellitus with neurological manifestation ([place]) 10/18/2007   Dyslipidemia 05/18/2006   Hypertensive heart disease     Orientation RESPIRATION BLADDER Height & Weight     Self, Situation, Time, Place  O2 (4LNC) Continent Weight: 187 lb 12.8 oz (85.2 kg) Height:  '5\' 8"'$  (172.7 cm)  BEHAVIORAL SYMPTOMS/MOOD NEUROLOGICAL BOWEL NUTRITION STATUS      Continent    AMBULATORY STATUS COMMUNICATION OF NEEDS Skin   Extensive Assist Verbally Normal                       Personal Care Assistance Level of Assistance  Bathing, Feeding, Dressing Bathing Assistance: Limited assistance Feeding assistance: Independent Dressing Assistance: Limited assistance     Functional Limitations Info  Sight, Hearing, Speech Sight Info: Impaired Hearing Info: Adequate Speech Info: Adequate    SPECIAL CARE FACTORS FREQUENCY  PT (By licensed PT), OT (By licensed OT)     PT Frequency: 5x/week OT Frequency: 5x/week            Contractures Contractures Info: Not present    Additional Factors Info  Code Status, Allergies Code Status  Info: DNR Allergies Info: Morpholine Salicylate, penicillins, Sglt2 Inhibitors, statins, morphine and related           Current Medications (03/03/2022):  This is the current hospital active medication list Current Facility-Administered Medications  Medication Dose Route Frequency Provider Last Rate Last Admin   acetaminophen (TYLENOL) tablet 650 mg  650 mg Oral Q6H PRN Mapp, Tavien, MD   650 mg at 03/03/22 0552   apixaban  (ELIQUIS) tablet 5 mg  5 mg Oral BID Masters, Katie, DO   5 mg at 03/03/22 0954   escitalopram (LEXAPRO) tablet 10 mg  10 mg Oral Daily Mapp, Tavien, MD   10 mg at 03/03/22 0954   furosemide (LASIX) tablet 40 mg  40 mg Oral BID Iona Coach, MD   40 mg at 03/03/22 0612   insulin aspart (novoLOG) injection 0-15 Units  0-15 Units Subcutaneous TID WC Mapp, Claudia Desanctis, MD   3 Units at 03/02/22 1750   insulin aspart (novoLOG) injection 5 Units  5 Units Subcutaneous TID WC Iona Coach, MD   5 Units at 03/03/22 0511   insulin glargine-yfgn (SEMGLEE) injection 28 Units  28 Units Subcutaneous QHS Iona Coach, MD   28 Units at 03/02/22 2234   magnesium sulfate IVPB 2 g 50 mL  2 g Intravenous Once Masters, Katie, DO       metFORMIN (GLUCOPHAGE) tablet 500 mg  500 mg Oral BID WC Masters, Katie, DO   500 mg at 03/03/22 0211   metoprolol succinate (TOPROL-XL) 24 hr tablet 12.5 mg  12.5 mg Oral Daily Iona Coach, MD   12.5 mg at 03/03/22 0953   polyethylene glycol (MIRALAX / GLYCOLAX) packet 17 g  17 g Oral Daily PRN Mapp, Tavien, MD       potassium chloride SA (KLOR-CON M) CR tablet 40 mEq  40 mEq Oral Q6H Masters, Katie, DO       sodium chloride (OCEAN) 0.65 % nasal spray 1 spray  1 spray Each Nare PRN Angelica Pou, MD       spironolactone (ALDACTONE) tablet 12.5 mg  12.5 mg Oral Daily Iona Coach, MD   12.5 mg at 03/03/22 1735   vitamin B-12 (CYANOCOBALAMIN) tablet 100 mcg  100 mcg Oral Daily Mapp, Claudia Desanctis, MD   100 mcg at 03/03/22 6701     Discharge Medications: Please see discharge summary for a list of discharge medications.  Relevant Imaging Results:  Relevant Lab Results:   Additional Information SSN # [id number]  [place], LCSW

## 2022-03-05 LAB — CULTURE, BODY FLUID W GRAM STAIN -BOTTLE: Culture: NO GROWTH

## 2022-03-07 ENCOUNTER — Telehealth: Payer: Self-pay | Admitting: Cardiovascular Disease

## 2022-03-07 DIAGNOSIS — K5900 Constipation, unspecified: Secondary | ICD-10-CM | POA: Diagnosis not present

## 2022-03-07 DIAGNOSIS — I5030 Unspecified diastolic (congestive) heart failure: Secondary | ICD-10-CM | POA: Diagnosis not present

## 2022-03-07 DIAGNOSIS — K7460 Unspecified cirrhosis of liver: Secondary | ICD-10-CM | POA: Diagnosis not present

## 2022-03-07 DIAGNOSIS — G4730 Sleep apnea, unspecified: Secondary | ICD-10-CM | POA: Diagnosis not present

## 2022-03-07 DIAGNOSIS — F411 Generalized anxiety disorder: Secondary | ICD-10-CM | POA: Diagnosis not present

## 2022-03-07 DIAGNOSIS — I482 Chronic atrial fibrillation, unspecified: Secondary | ICD-10-CM | POA: Diagnosis not present

## 2022-03-07 DIAGNOSIS — K0889 Other specified disorders of teeth and supporting structures: Secondary | ICD-10-CM | POA: Diagnosis not present

## 2022-03-07 DIAGNOSIS — E119 Type 2 diabetes mellitus without complications: Secondary | ICD-10-CM | POA: Diagnosis not present

## 2022-03-07 DIAGNOSIS — I872 Venous insufficiency (chronic) (peripheral): Secondary | ICD-10-CM | POA: Diagnosis not present

## 2022-03-07 DIAGNOSIS — E782 Mixed hyperlipidemia: Secondary | ICD-10-CM | POA: Diagnosis not present

## 2022-03-07 DIAGNOSIS — I25810 Atherosclerosis of coronary artery bypass graft(s) without angina pectoris: Secondary | ICD-10-CM | POA: Diagnosis not present

## 2022-03-07 DIAGNOSIS — R296 Repeated falls: Secondary | ICD-10-CM | POA: Diagnosis not present

## 2022-03-07 LAB — CYTOLOGY - NON PAP

## 2022-03-07 NOTE — Telephone Encounter (Signed)
Pt c/o Shortness Of Breath: STAT if SOB developed within the last 24 hours or pt is noticeably SOB on the phone  1. Are you currently SOB (can you hear that pt is SOB on the phone)? Yes, based on when she last spoke with him  2. How long have you been experiencing SOB? Since before 12/17 when admitted   3. Are you SOB when sitting or when up moving around? Both   4. Are you currently experiencing any other symptoms? Unsure sister is just getting over Covid, so she has not been able to see patient   Patient is complaining of SOB again since leaving the hospital. He is on oxygen, but is stating he has asked it to be turned up. Patient is currently staying at [facility] their contact  number is [phone]. She is requesting the facility be called regarding pt's symptoms and she be called back with an update. She states she is unsure of facility being aware of appt on Thursday for hospital f/u. Please advise.

## 2022-03-07 NOTE — Telephone Encounter (Signed)
Returned call to sister who states that other sister has COVID and cannot bring pt to appt as usually does. Pt is currently in [place] and I called the facility who will transport him here. Spoke with Lurline Idol and confirmed date and time of appt. She states that family member should be with him during appt since he has dementia. Assured her that sister Beverlee Nims will be here waiting for him. As for SOB or oxygen use, we will see what visit entails, but that it's ultimately up to the facility to manage.

## 2022-03-08 NOTE — Progress Notes (Deleted)
Cardiology Office Note:    Date:  03/08/2022   ID:  Mark Cabrera, DOB [birth year], MRN [id number]  PCP:  [name], MD   [facility] Providers Cardiologist:  Lauree Chandler, MD { Click to update primary MD,subspecialty MD or APP then REFRESH:1}    Referring MD: [name], MD   Chief Complaint: ***  History of Present Illness:    Mark Cabrera is a *** 71 y.o. male with a hx of CAD s/p CABG x 14 March 2016, HTN, HLD, PAF, diabetes, severe aortic stenosis s/p AVR January 2018, CVA 2007, hepatic cirrhosis, cognitive impairment, and combined CHF.  Hospital admission [facility] January 2018 and underwent surgical AVR for AS and 2V CABG (SVG to PDA and SVG to OM1) along with left atrial appendage clipping per Dr. Roxan Hockey.  He had postop atrial fibrillation and volume overload.  Postop course complicated by large pericardial effusion and pleural effusion.  He was readmitted and had thoracentesis and pericardial window.  Had issues with volume overload postop and was started on Lasix.  TTE April 2020 with LVEF 50 to 55%, severe LVH, AV bioprosthetic valve working well. Left atrium severely dilated.  Ongoing chest pain since surgery. Sternal wire removal June 2019. Stroke April 2020 felt to be due to embolic event. He was started on Eliquis.  Admission to [facility] with chest pain 01/10/2019 with cardiac cath that day with occlusion of SVG to OM, severe stenosis in the body of the SVG to the PDA and CTO of LAD.  A drug-eluting stent was placed in the mid LAD (2.75 x 32 mm Synergy DES) and a drug-eluting stent was placed in the body of the SVG to the PDAn(3.5 x 20 mm Synergy DES).  He was discharged on ASA and Plavix but both later stopped. Seen in [facility] ED 02/20/19 with c/o chest pain but no evidence of ACS. Echo 04/11/19 with LVEF 50-55%, severe LVH, normally functioning AVR. Echo 04/29/20 with LVEF 50-55%, mild MR, no pericardial effusion.   Last cardiology clinic  visit was 05/03/20 with Dr. [name] at which time Imdur 30 mg daily was added for chest pain. He was advised to return in 12 months for office visit.   Admission 12/17-12/22/23, brought in from SNF with elevated BNP 1010 drawn 2/2 symptoms of SOB and ankle edema.  Upon arrival in the ED ED, BNP was 373, hsTrop 40 ? 45. CXR showed small-moderate left sided pleural effusion and left lower lobe atelectasis or infiltrate. Chest CT revealed trace pulmonary fibrosis  throughout the lungs, likely due to prior COVID infection, bil moderate pleural effusions, nonspecific scattered subpleural pulmonary micronodules.  Underwent left thoracentesis 12/19 that yielded 1300 cc fluid.  He was aware of the year but not the month, knew that he was in a medical facility but no further details, did not recall symptoms he was having prior to being brought in.  Found to have newly reduced LVEF 30 to 35%, moderately reduced RV systolic function evaluated by cardiology and felt not to be a candidate for catheterization. He refused telemetry during admission. BP was soft, limiting GDMT. Was discharged on metoprolol 12.5 and spironolactone 12.5 mg daily, Lasix 40 mg twice daily with plan for follow-up BMP, mag at outpatient office visit.  His sister called our office on 03/07/2022 to report patient continuing to have SOB.  He is on chronic O2.  Sister will plan to accompany him to office visit.  Today, he is here  Past Medical History:  Diagnosis Date   Acute diastolic congestive heart failure (HCC)    AKI (acute kidney injury) ([place])    Aortic valve stenosis s/p AVR 2018   Echo 2/22: Poor acoustic windows, EF 50-55, mild LVH, normal RVSF, mild MR, trivial AI, no AS, no pericardial effusion   Atrial fibrillation (HCC) - post-op CABG    04/2016 CHA2DS2VAS score = 5   Atypical nevi    Coronary artery disease s/p 2 vessel CABG    Depression    "years ago"   Diabetes mellitus    Dyspnea    in the past    GERD  (gastroesophageal reflux disease)    Heart murmur    Hepatic cirrhosis ([place]) 06/29/2017   History of kidney stones    Hyperlipidemia    hx of transaminitis secondary to statin and he has decided not to use statins secondary to potential side effects.   Hypertension    Nephrolithiasis    Osteoarthritis, knee    Pericardial effusion a. subxiphoid pericardial window on 04/21/2016 04/28/2016   Peripheral neuropathic pain 05/12/2016   Sleep apnea     Central apnea. 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 2-D echo was normal no embolic source found.    Past Surgical History:  Procedure Laterality Date   AORTIC VALVE REPLACEMENT N/A 03/30/2016   Procedure: AORTIC VALVE REPLACEMENT (AVR);  Surgeon: [name], MD;  Location: [place];  Service: Open Heart Surgery;  Laterality: N/A;   CARDIAC CATHETERIZATION N/A 03/15/2016   Procedure: Right/Left Heart Cath and Coronary Angiography;  Surgeon: Burnell Blanks, MD;  Location: [place] [facility];  Service: Cardiovascular;  Laterality: N/A;   CLIPPING OF ATRIAL APPENDAGE N/A 03/30/2016   Procedure: CLIPPING OF LEFT ATRIAL APPENDAGE;  Surgeon: [name], MD;  Location: [place];  Service: Open Heart Surgery;  Laterality: N/A;   CORONARY ARTERY BYPASS GRAFT N/A 03/30/2016   Procedure: CORONARY ARTERY BYPASS GRAFTING (CABG) Times Two;  Surgeon: [name], MD;  Location: [place];  Service: Open Heart Surgery;  Laterality: N/A;   IR THORACENTESIS ASP PLEURAL SPACE W/IMG GUIDE  02/28/2022   IR THORACENTESIS ASP PLEURAL SPACE W/IMG GUIDE  03/02/2022   KNEE ARTHROSCOPY W/ PARTIAL MEDIAL  MENISCECTOMY  05/12/2005   right, performed by Dr. French Ana for torn medial meniscus.   ROTATOR CUFF REPAIR Right 2016   STERNAL WIRES REMOVAL N/A 08/13/2017   Procedure: STERNAL WIRES REMOVAL;  Surgeon: [name], MD;  Location: [place];  Service: Thoracic;  Laterality: N/A;   SUBXYPHOID PERICARDIAL WINDOW N/A 04/21/2016   Procedure: SUBXYPHOID PERICARDIAL WINDOW;  Surgeon: [name], MD;  Location: [place];  Service: Thoracic;  Laterality: N/A;   TEE WITHOUT CARDIOVERSION N/A 03/30/2016   Procedure: TRANSESOPHAGEAL ECHOCARDIOGRAM (TEE);  Surgeon: [name], MD;  Location: [place];  Service: Open Heart Surgery;  Laterality: N/A;   TEE WITHOUT CARDIOVERSION N/A 04/21/2016   Procedure: TRANSESOPHAGEAL ECHOCARDIOGRAM (TEE);  Surgeon: [name], MD;  Location: [facility];  Service: Thoracic;  Laterality: N/A;    Current Medications: No outpatient medications have been marked  as taking for the 03/09/22 encounter (Appointment) with Ann Maki, Lanice Schwab, NP.     Allergies:   Morpholine salicylate, Penicillins, Morphine and related, Sglt2 inhibitors, and Statins   Social History   Socioeconomic History   Marital status: Divorced    Spouse name: Not on file   Number of children: 1   Years of education: 2y college   Highest education level: Not on file  Occupational History   Occupation:  Medicaid /disability    Employer: UNEMPLOYED    Comment: following stroke in 2007  Tobacco Use   Smoking status: Never   Smokeless tobacco: Never  Vaping Use   Vaping Use: Never used  Substance and Sexual Activity   Alcohol use: Yes    Alcohol/week: 1.0 standard drink of alcohol    Types: 1 Cans of beer per week    Comment: occasional   Drug use: No   Sexual activity: Not Currently  Other Topics Concern   Not on file  Social History Narrative   Single but has a girlfriend.    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       Patient has  medications disability post stroke.            Social Determinants of Health   Financial Resource Strain: Medium Risk (01/18/2022)   Overall Financial Resource Strain (CARDIA)    Difficulty of Paying Living Expenses: Somewhat hard  Food Insecurity: No Food Insecurity (02/27/2022)   Hunger Vital Sign    Worried About Running Out of Food in the Last Year: Never true    Ran Out of Food in the Last Year: Never true  Recent Concern: [place] Present (01/21/2022)   Hunger Vital Sign    Worried About Running Out of Food in the Last Year: Sometimes true    Ran Out of Food in the Last Year: Sometimes true  Transportation Needs: Unmet Transportation Needs (02/27/2022)   PRAPARE - Hydrologist (Medical): Yes    Lack of Transportation (Non-Medical): Yes  Physical Activity: Inactive (01/18/2022)   Exercise Vital Sign    Days of Exercise per Week: 0 days    Minutes of Exercise per Session: 0 min  Stress: Stress Concern Present (01/18/2022)   Friendly    Feeling of Stress : Very much  Social Connections: Moderately Isolated (01/18/2022)   Social Connection and Isolation Panel [NHANES]    Frequency of Communication with Friends and Family: More than three times a week    Frequency of Social Gatherings with Friends and Family: Once a week    Attends Religious Services: 1 to 4 times per year    Active Member of [employer] or Organizations: No    Attends Archivist Meetings: Never    Marital Status: Divorced     Family History: The patient's ***family history includes Alcohol abuse in his father; Diabetes in his mother; Hypertension in his mother.  ROS:   Please see the history of present illness.    *** All other systems reviewed and are negative.  Labs/Other Studies Reviewed:    The following studies were reviewed today:  Echo 02/28/22 1. Left ventricular  ejection fraction, by estimation, is 30 to 35%. The  left ventricle has moderately decreased function. The left ventricle  demonstrates regional wall motion abnormalities (see scoring  diagram/findings for description). Left ventricular   diastolic function  could not be evaluated.   2. Right ventricular systolic function is moderately reduced. The right  ventricular size is normal. There is mildly elevated pulmonary artery  systolic pressure. The estimated right ventricular systolic pressure is  22.2 mmHg.   3. Left atrial size was mild to moderately dilated.   4. The mitral valve is grossly normal. Moderate mitral valve  regurgitation. No evidence of mitral stenosis.   5. The aortic valve is tricuspid. Aortic valve regurgitation is not  visualized. No aortic stenosis is present.   6. The inferior vena cava is dilated in size with <50% respiratory  variability, suggesting right atrial pressure of 15 mmHg.   Comparison(s): Changes from prior study are noted. The left ventricular  function is significantly worse.  Recent Labs: 09/22/2021: TSH 0.925 02/26/2022: B Natriuretic Peptide 373.4 02/27/2022: ALT 12; Hemoglobin 9.9; Platelets 236 03/03/2022: BUN 19; Creatinine, Ser 0.99; Magnesium 1.6; Potassium 3.2; Sodium 140  Recent Lipid Panel    Component Value Date/Time   CHOL 133 04/09/2020 0814   TRIG 79 04/09/2020 0814   HDL 49 04/09/2020 0814   CHOLHDL 2.7 04/09/2020 0814   CHOLHDL 6.1 06/29/2018 0420   VLDL 48 (H) 06/29/2018 0420   LDLCALC 68 04/09/2020 0814     Risk Assessment/Calculations:    CHA2DS2-VASc Score = 4  This indicates a 4.8% annual risk of stroke. The patient's score is based upon: CHF History: 1 HTN History: 1 Diabetes History: 0 Stroke History: 0 Vascular Disease History: 1 Age Score: 1 Gender Score: 0    Physical Exam:    VS:  There were no vitals taken for this visit.    Wt Readings from Last 3 Encounters:  03/03/22 187 lb 12.8 oz (85.2 kg)   01/17/22 171 lb (77.6 kg)  01/18/22 171 lb 11.2 oz (77.9 kg)     GEN: *** Well nourished, well developed in no acute distress HEENT: Normal NECK: No JVD; No carotid bruits CARDIAC: ***RRR, no murmurs, rubs, gallops RESPIRATORY:  Clear to auscultation without rales, wheezing or rhonchi  ABDOMEN: Soft, non-tender, non-distended MUSCULOSKELETAL:  No edema; No deformity. *** pedal pulses, ***bilaterally SKIN: Warm and dry NEUROLOGIC:  Alert and oriented x 3 PSYCHIATRIC:  Normal affect   EKG:  EKG is *** ordered today.  The ekg ordered today demonstrates ***  No BP recorded.  {Refresh Note OR Click here to enter BP  :1}***    Diagnoses:    No diagnosis found. Assessment and Plan:     Acute on chronic combined CHF:  CAD s/p CABG:   Chronic venous stasis: Persistent 2+ bilateral pitting edema of the legs and bilateral venous stasis changes treated for cellulitis during admission.   Aortic stenosis s/p AVR: S/p AVR 2018.  TTE 02/28/2022 with normal functioning valve, no stenosis or regurgitation visualized, peak gradient 16.5 mmHg  PAF on chronic anticoagulation:  {Are you ordering a CV Procedure (e.g. stress test, cath, DCCV, TEE, etc)?   Press F2        :[id number]}   Disposition:  Medication Adjustments/Labs and Tests Ordered: Current medicines are reviewed at length with the patient today.  Concerns regarding medicines are outlined above.  No orders of the defined types were placed in this encounter.  No orders of the defined types were placed in this encounter.   There are no Patient Instructions on file for this visit.   Signed, Emmaline Life, NP  03/08/2022 9:27 AM    [place]

## 2022-03-09 ENCOUNTER — Ambulatory Visit: Payer: Medicare Other | Admitting: Nurse Practitioner

## 2022-03-10 DIAGNOSIS — K7460 Unspecified cirrhosis of liver: Secondary | ICD-10-CM | POA: Diagnosis not present

## 2022-03-10 DIAGNOSIS — I509 Heart failure, unspecified: Secondary | ICD-10-CM | POA: Diagnosis not present

## 2022-03-10 DIAGNOSIS — I4891 Unspecified atrial fibrillation: Secondary | ICD-10-CM | POA: Diagnosis not present

## 2022-03-10 DIAGNOSIS — E1165 Type 2 diabetes mellitus with hyperglycemia: Secondary | ICD-10-CM | POA: Diagnosis not present

## 2022-03-15 LAB — MISC LABCORP TEST (SEND OUT)
Labcorp test code: 9985
Labcorp test code: 9985

## 2022-03-17 ENCOUNTER — Emergency Department (HOSPITAL_COMMUNITY)
Admission: EM | Admit: 2022-03-17 | Discharge: 2022-03-18 | Disposition: A | Discharge status: Skilled Nursing Facility | Payer: Medicare Other | Attending: Emergency Medicine | Admitting: Emergency Medicine

## 2022-03-17 ENCOUNTER — Other Ambulatory Visit: Payer: Self-pay

## 2022-03-17 DIAGNOSIS — Z046 Encounter for general psychiatric examination, requested by authority: Secondary | ICD-10-CM | POA: Diagnosis present

## 2022-03-17 DIAGNOSIS — I10 Essential (primary) hypertension: Secondary | ICD-10-CM | POA: Diagnosis not present

## 2022-03-17 DIAGNOSIS — Z79899 Other long term (current) drug therapy: Secondary | ICD-10-CM | POA: Diagnosis not present

## 2022-03-17 DIAGNOSIS — I5031 Acute diastolic (congestive) heart failure: Secondary | ICD-10-CM | POA: Diagnosis not present

## 2022-03-17 DIAGNOSIS — Z951 Presence of aortocoronary bypass graft: Secondary | ICD-10-CM | POA: Diagnosis not present

## 2022-03-17 DIAGNOSIS — R451 Restlessness and agitation: Secondary | ICD-10-CM | POA: Diagnosis not present

## 2022-03-17 DIAGNOSIS — E119 Type 2 diabetes mellitus without complications: Secondary | ICD-10-CM | POA: Insufficient documentation

## 2022-03-17 DIAGNOSIS — F419 Anxiety disorder, unspecified: Secondary | ICD-10-CM | POA: Insufficient documentation

## 2022-03-17 DIAGNOSIS — R4689 Other symptoms and signs involving appearance and behavior: Secondary | ICD-10-CM | POA: Insufficient documentation

## 2022-03-17 DIAGNOSIS — F0390 Unspecified dementia without behavioral disturbance: Secondary | ICD-10-CM | POA: Insufficient documentation

## 2022-03-17 DIAGNOSIS — I2510 Atherosclerotic heart disease of native coronary artery without angina pectoris: Secondary | ICD-10-CM | POA: Diagnosis not present

## 2022-03-17 DIAGNOSIS — I110 Hypertensive heart disease with heart failure: Secondary | ICD-10-CM | POA: Insufficient documentation

## 2022-03-17 DIAGNOSIS — R456 Violent behavior: Secondary | ICD-10-CM | POA: Diagnosis not present

## 2022-03-17 LAB — CBC WITH DIFFERENTIAL/PLATELET
Abs Immature Granulocytes: 0.02 10*3/uL (ref 0.00–0.07)
Basophils Absolute: 0 10*3/uL (ref 0.0–0.1)
Basophils Relative: 1 %
Eosinophils Absolute: 0.1 10*3/uL (ref 0.0–0.5)
Eosinophils Relative: 2 %
HCT: 31 % — ABNORMAL LOW (ref 39.0–52.0)
Hemoglobin: 10.1 g/dL — ABNORMAL LOW (ref 13.0–17.0)
Immature Granulocytes: 1 %
Lymphocytes Relative: 32 %
Lymphs Abs: 0.9 10*3/uL (ref 0.7–4.0)
MCH: 32 pg (ref 26.0–34.0)
MCHC: 32.6 g/dL (ref 30.0–36.0)
MCV: 98.1 fL (ref 80.0–100.0)
Monocytes Absolute: 0.1 10*3/uL (ref 0.1–1.0)
Monocytes Relative: 4 %
Neutro Abs: 1.7 10*3/uL (ref 1.7–7.7)
Neutrophils Relative %: 60 %
Platelets: 287 10*3/uL (ref 150–400)
RBC: 3.16 MIL/uL — ABNORMAL LOW (ref 4.22–5.81)
RDW: 16.1 % — ABNORMAL HIGH (ref 11.5–15.5)
WBC: 2.8 10*3/uL — ABNORMAL LOW (ref 4.0–10.5)
nRBC: 0 % (ref 0.0–0.2)

## 2022-03-17 LAB — COMPREHENSIVE METABOLIC PANEL
ALT: 12 U/L (ref 0–44)
AST: 19 U/L (ref 15–41)
Albumin: 3.6 g/dL (ref 3.5–5.0)
Alkaline Phosphatase: 71 U/L (ref 38–126)
Anion gap: 10 (ref 5–15)
BUN: 16 mg/dL (ref 8–23)
CO2: 29 mmol/L (ref 22–32)
Calcium: 9.2 mg/dL (ref 8.9–10.3)
Chloride: 96 mmol/L — ABNORMAL LOW (ref 98–111)
Creatinine, Ser: 1.09 mg/dL (ref 0.61–1.24)
GFR, Estimated: >60 mL/min (ref >60)
Glucose, Bld: 279 mg/dL — ABNORMAL HIGH (ref 70–99)
Potassium: 3.4 mmol/L — ABNORMAL LOW (ref 3.5–5.1)
Sodium: 135 mmol/L (ref 135–145)
Total Bilirubin: 0.6 mg/dL (ref 0.3–1.2)
Total Protein: 7.5 g/dL (ref 6.5–8.1)

## 2022-03-17 LAB — RAPID URINE DRUG SCREEN, HOSP PERFORMED
Amphetamines: NOT DETECTED
Barbiturates: NOT DETECTED
Benzodiazepines: NOT DETECTED
Cocaine: NOT DETECTED
Opiates: NOT DETECTED
Tetrahydrocannabinol: NOT DETECTED

## 2022-03-17 LAB — ETHANOL: Alcohol, Ethyl (B): <10 mg/dL (ref <10)

## 2022-03-17 NOTE — ED Triage Notes (Signed)
Patient arrived with EMS from [facility] staff reported patient's combative behavior towards residents and staff this evening .

## 2022-03-17 NOTE — ED Provider Triage Note (Signed)
Emergency Medicine Provider Triage Evaluation Note  Mark Cabrera , a 72 y.o. male  was evaluated in triage.  Pt complains of behavioral issues.  Patient brought in by [facility] due to aggressive type behavior.  Patient states that he was asleep and his oxygen stopped working.  He woke up feeling short of breath.  The nurse came into the room and attempted to fix his oxygen and patient became irritated because he was short of breath.  They subsequently got to an argument and patient began to elicit aggressive type behavior.  EMS was called subsequently.  Patient states that he feels at baseline since his 5 L home oxygen was continued.  Denies any suicidal/homicidal ideation, auditory/visual hallucination.  Patient without complaints currently.  Review of Systems  Positive: See above Negative:   Physical Exam  BP 119/82 (BP Location: Right Arm)   Pulse (!) 59   Temp 98.2 F (36.8 C) (Oral)   Resp 18   SpO2 100%  Gen:   Awake, no distress   Resp:  Normal effort  MSK:   Moves extremities without difficulty  Other:    Medical Decision Making  Medically screening exam initiated at 8:24 PM.  Appropriate orders placed.  Mark Cabrera was informed that the remainder of the evaluation will be completed by another provider, this initial triage assessment does not replace that evaluation, and the importance of remaining in the ED until their evaluation is complete.     Mark Cabrera, [place] 03/17/22 2026

## 2022-03-18 DIAGNOSIS — R4689 Other symptoms and signs involving appearance and behavior: Secondary | ICD-10-CM | POA: Diagnosis not present

## 2022-03-18 DIAGNOSIS — F419 Anxiety disorder, unspecified: Secondary | ICD-10-CM | POA: Diagnosis not present

## 2022-03-18 MED ORDER — SPIRONOLACTONE 12.5 MG HALF TABLET
12.5000 mg | ORAL_TABLET | Freq: Every day | ORAL | Status: DC
  Administered 2022-03-18: 12.5 mg via ORAL
  Filled 2022-03-18: qty 1

## 2022-03-18 MED ORDER — HYDROMORPHONE HCL 1 MG/ML IJ SOLN: 0.5000 mg | Freq: Once | INTRAMUSCULAR | Status: DC

## 2022-03-18 MED ORDER — INSULIN GLARGINE-YFGN 100 UNIT/ML ~~LOC~~ SOLN
26.0000 [IU] | Freq: Every day | SUBCUTANEOUS | Status: DC
  Filled 2022-03-18: qty 0.26

## 2022-03-18 MED ORDER — POLYETHYLENE GLYCOL 3350 17 G PO PACK
17.0000 g | PACK | Freq: Every day | ORAL | Status: DC
  Administered 2022-03-18: 17 g via ORAL
  Filled 2022-03-18: qty 1

## 2022-03-18 MED ORDER — INSULIN ASPART 100 UNIT/ML IJ SOLN
5.0000 [IU] | Freq: Three times a day (TID) | INTRAMUSCULAR | Status: DC
  Administered 2022-03-18: 5 [IU] via SUBCUTANEOUS

## 2022-03-18 MED ORDER — VALACYCLOVIR HCL 500 MG PO TABS
1000.0000 mg | ORAL_TABLET | Freq: Two times a day (BID) | ORAL | Status: DC
  Administered 2022-03-18: 1000 mg via ORAL
  Filled 2022-03-18 (×3): qty 2

## 2022-03-18 MED ORDER — ESCITALOPRAM OXALATE 10 MG PO TABS
10.0000 mg | ORAL_TABLET | Freq: Every day | ORAL | Status: DC
  Administered 2022-03-18: 10 mg via ORAL
  Filled 2022-03-18: qty 1

## 2022-03-18 MED ORDER — FUROSEMIDE 20 MG PO TABS
40.0000 mg | ORAL_TABLET | Freq: Two times a day (BID) | ORAL | Status: DC
  Administered 2022-03-18: 40 mg via ORAL
  Filled 2022-03-18: qty 2

## 2022-03-18 MED ORDER — QUETIAPINE FUMARATE ER 50 MG PO TB24: 50.0000 mg | ORAL_TABLET | Freq: Every day | ORAL | 0 refills | Status: DC

## 2022-03-18 MED ORDER — QUETIAPINE FUMARATE 25 MG PO TABS
50.0000 mg | ORAL_TABLET | ORAL | Status: AC
  Administered 2022-03-18: 50 mg via ORAL
  Filled 2022-03-18: qty 2

## 2022-03-18 MED ORDER — BENZOCAINE 10 % MT GEL: 1.0000 | Freq: Four times a day (QID) | OROMUCOSAL | Status: DC | PRN

## 2022-03-18 MED ORDER — APIXABAN 5 MG PO TABS
5.0000 mg | ORAL_TABLET | Freq: Two times a day (BID) | ORAL | Status: DC
  Administered 2022-03-18: 5 mg via ORAL
  Filled 2022-03-18: qty 1

## 2022-03-18 MED ORDER — METOPROLOL SUCCINATE ER 25 MG PO TB24
12.5000 mg | ORAL_TABLET | Freq: Every day | ORAL | Status: DC
  Administered 2022-03-18: 12.5 mg via ORAL
  Filled 2022-03-18: qty 1

## 2022-03-18 MED ORDER — ACETAMINOPHEN 325 MG PO TABS: 650.0000 mg | ORAL_TABLET | ORAL | Status: DC | PRN

## 2022-03-18 MED ORDER — VITAMIN B-12 100 MCG PO TABS
100.0000 ug | ORAL_TABLET | Freq: Every day | ORAL | Status: DC
  Administered 2022-03-18: 100 ug via ORAL
  Filled 2022-03-18: qty 1

## 2022-03-18 MED ORDER — IPRATROPIUM-ALBUTEROL 0.5-2.5 (3) MG/3ML IN SOLN: 3.0000 mL | Freq: Four times a day (QID) | RESPIRATORY_TRACT | Status: DC | PRN

## 2022-03-18 NOTE — ED Notes (Signed)
[facility] was called, next out

## 2022-03-18 NOTE — Consult Note (Cosign Needed Addendum)
[facility] ED ASSESSMENT   Reason for Consult:  Eval Referring Physician:  Dr. Sedonia Small Patient Identification: Mark Cabrera MRN:  [id number] ED Chief Complaint: Aggression  Diagnosis:  Principal Problem:   Aggression   ED Assessment Time Calculation: Start Time: 1115 Stop Time: 1200 Total Time in Minutes (Assessment Completion): 45   HPI:   Mark Cabrera is a 72 y.o. male patient brought to ED by EMS due to behavioral problem at his nursing home he resides in, [facility]. Apparently patient has been more forgetful recently, and has been quick to anger with some aggressive outbursts. According to daughter, who is also his legal guardian, he felt like his oxygen levels were dropping and no one was helping him so he threw his oxygen tank out of his room. He was brought to ED for further evaluation.   Subjective:   Pt seen at [facility] for face to face evaluation. He is alert and oriented x4. He is able to tell me his full name, DOB, current month, year, location, and president. He does not recall the events of last night that led him to hospitalization. When I mentioned to him he threw his oxygen tank and was angry/frustrated he stated "well I wasn't trying to hurt anyone." He denies any previous psychiatric history. Denies any psychotropic medications. Denies any suicidal or homicidal ideations. Denies hx of suicide attempts. Denies any auditory or visual hallucinations. Pt does report he has not been sleeping well for the past few weeks and is unsure why. He reported getting "maybe a few hours every night." He denies problems with appetite. He does not have a lot of insight about his anger or why his aggressive outburst was inappropriate. Pt stated he feels safe returning but mentioned "I don't love it there. Its not a home." He denies any abuse or neglect. He reports "okay" relationship with daughter, states she comes to visit him a good amount. Pt has had recent health decline, which  likely is also causing his outbursts/quick agitation. He became upset when he thought his oxygen was dropping and he felt like no one was helping him, this probably resulted in increased anxiety and agitation. We talked about different ways he can utilize coping skills if feeling anxious, and different ways he can go about communicating with staff if he becomes concerned with health related issues. We talked about starting Seroquel, to help with his sleep and mood and he was agreeable to this.  Patient given 50 mg of Seroquel this morning to ensure no adverse reactions, patient is denying any side effects at this time.  I spoke with daughter/LG, Mark Cabrera, at [phone].  She tells me she has had guardianship over him for the past couple of months.  She denies patient having any formal psychiatric diagnosis/history.  She states as a child he was always quick to get angry.  She has concerns for dementia as well as his primary care provider.  She tells me he has been very forgetful recently, she stated in the waiting room last night he asked where he was around 10 different times.  Patient has no formal dementia diagnosis.  I did inform her she will need to get him into neurology to start formal testing, she states it is very difficult for him to see a doctor or even go to the hospital.  She is worried Mark Cabrera will not let him return, as they feel like he might need a memory care unit.  Spoke with  her about plan to start Seroquel at night to help with mood and insomnia.  She is agreeable with this plan.  Also spoke with her he does not meet criteria for any inpatient psychiatric treatment, especially with his current medical concerns/need for continuous oxygen.  She is also agreeable with this and feels he is appropriate to discharge especially with the new medication being started to target his mood/outburst.  I attempted to contact Mark Cabrera to speak to anyone regarding his care, and how to go about  transportation/if patient is allowed back to facility. Receptionist Mark Cabrera answered phone and attempted to transfer me to patient's floor, however no answer. I called Mark Cabrera back and she attempted to transfer me to Director, however no answer. I called Mark Cabrera back a third time, and she stated she would take my number and have someone call as soon as possible, as she is unsure about specifics regarding his care. No one has contacted me back since. Will order TOC to help assist with dispo. Pt is psychiatrically cleared.   Past Psychiatric History:  N/A  Risk to Self or Others: Is the patient at risk to self? No Has the patient been a risk to self in the past 6 months? No Has the patient been a risk to self within the distant past? No Is the patient a risk to others? No Has the patient been a risk to others in the past 6 months? No Has the patient been a risk to others within the distant past? No  [place] Scale:  [place] ED from 03/17/2022 in [place] ED to Hosp-Admission (Discharged) from 02/26/2022 in [place] [facility] PCU ED to Hosp-Admission (Discharged) from 01/17/2022 in [place] CATEGORY No Risk No Risk No Risk       Past Medical History:  Past Medical History:  Diagnosis Date   Acute diastolic congestive heart failure (HCC)    AKI (acute kidney injury) ([place])    Aortic valve stenosis s/p AVR 2018   Echo 2/22: Poor acoustic windows, EF 50-55, mild LVH, normal RVSF, mild MR, trivial AI, no AS, no pericardial effusion   Atrial fibrillation (HCC) - post-op CABG    04/2016 CHA2DS2VAS score = 5   Atypical nevi    Coronary artery disease s/p 2 vessel CABG    Depression    "years ago"   Diabetes mellitus    Dyspnea    in the past    GERD (gastroesophageal reflux disease)    Heart murmur    Hepatic cirrhosis ([place]) 06/29/2017   History of kidney stones    Hyperlipidemia    hx of transaminitis  secondary to statin and he has decided not to use statins secondary to potential side effects.   Hypertension    Nephrolithiasis    Osteoarthritis, knee    Pericardial effusion a. subxiphoid pericardial window on 04/21/2016 04/28/2016   Peripheral neuropathic pain 05/12/2016   Sleep apnea     Central apnea. 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 2-D echo was normal no embolic source found.  Past Surgical History:  Procedure Laterality Date   AORTIC VALVE REPLACEMENT N/A 03/30/2016   Procedure: AORTIC VALVE REPLACEMENT (AVR);  Surgeon: [name], MD;  Location: [place];  Service: Open Heart Surgery;  Laterality: N/A;   CARDIAC CATHETERIZATION N/A 03/15/2016   Procedure: Right/Left Heart Cath and Coronary Angiography;  Surgeon: Burnell Blanks, MD;  Location: [place] CV LAB;  Service: Cardiovascular;  Laterality: N/A;   CLIPPING OF ATRIAL APPENDAGE N/A 03/30/2016   Procedure: CLIPPING OF LEFT ATRIAL APPENDAGE;  Surgeon: [name], MD;  Location: [place];  Service: Open Heart Surgery;  Laterality: N/A;   CORONARY ARTERY BYPASS GRAFT N/A 03/30/2016   Procedure: CORONARY ARTERY BYPASS GRAFTING (CABG) Times Two;  Surgeon: [name], MD;  Location: [place];  Service: Open Heart Surgery;  Laterality: N/A;   IR THORACENTESIS ASP PLEURAL SPACE W/IMG GUIDE  02/28/2022   IR THORACENTESIS ASP PLEURAL SPACE W/IMG GUIDE  03/02/2022   KNEE ARTHROSCOPY W/ PARTIAL MEDIAL MENISCECTOMY  05/12/2005   right, performed by Dr. French Ana for torn medial meniscus.   ROTATOR CUFF REPAIR Right 2016   STERNAL WIRES REMOVAL N/A 08/13/2017    Procedure: STERNAL WIRES REMOVAL;  Surgeon: [name], MD;  Location: [place];  Service: Thoracic;  Laterality: N/A;   SUBXYPHOID PERICARDIAL WINDOW N/A 04/21/2016   Procedure: SUBXYPHOID PERICARDIAL WINDOW;  Surgeon: [name], MD;  Location: [place];  Service: Thoracic;  Laterality: N/A;   TEE WITHOUT CARDIOVERSION N/A 03/30/2016   Procedure: TRANSESOPHAGEAL ECHOCARDIOGRAM (TEE);  Surgeon: [name], MD;  Location: [place];  Service: Open Heart Surgery;  Laterality: N/A;   TEE WITHOUT CARDIOVERSION N/A 04/21/2016   Procedure: TRANSESOPHAGEAL ECHOCARDIOGRAM (TEE);  Surgeon: [name], MD;  Location: [place];  Service: Thoracic;  Laterality: N/A;   Family History:  Family History  Problem Relation Age of Onset   Hypertension Mother    Diabetes Mother    Alcohol abuse Father    Social History:  Social History   Substance and Sexual Activity  Alcohol Use Yes   Alcohol/week: 1.0 standard drink of alcohol   Types: 1 Cans of beer per week   Comment: occasional     Social History   Substance and Sexual Activity  Drug Use No    Social History   Socioeconomic History   Marital status: Divorced    Spouse name: Not on file   Number of children: 1   Years of education: 2y college   Highest education level: Not on file  Occupational History   Occupation:  Medicaid /disability    Employer: UNEMPLOYED    Comment: following stroke in 2007  Tobacco Use   Smoking status: Never   Smokeless tobacco: Never  Vaping Use   Vaping Use: Never used  Substance and Sexual Activity   Alcohol use: Yes    Alcohol/week: 1.0 standard drink of alcohol    Types: 1 Cans of beer per week    Comment: occasional   Drug use: No   Sexual activity: Not Currently  Other Topics Concern   Not on file  Social History Narrative   Single but has a girlfriend.    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       Patient has medications  disability post stroke.            Social Determinants of Health   Financial Resource Strain: Medium Risk (01/18/2022)  Overall Financial Resource Strain (CARDIA)    Difficulty of Paying Living Expenses: Somewhat hard  Food Insecurity: No Food Insecurity (02/27/2022)   Hunger Vital Sign    Worried About Running Out of Food in the Last Year: Never true    Ran Out of Food in the Last Year: Never true  Recent Concern: [place] Present (01/21/2022)   Hunger Vital Sign    Worried About Running Out of Food in the Last Year: Sometimes true    Ran Out of Food in the Last Year: Sometimes true  Transportation Needs: Unmet Transportation Needs (02/27/2022)   PRAPARE - Hydrologist (Medical): Yes    Lack of Transportation (Non-Medical): Yes  Physical Activity: Inactive (01/18/2022)   Exercise Vital Sign    Days of Exercise per Week: 0 days    Minutes of Exercise per Session: 0 min  Stress: Stress Concern Present (01/18/2022)   [place]    Feeling of Stress : Very much  Social Connections: Moderately Isolated (01/18/2022)   Social Connection and Isolation Panel [NHANES]    Frequency of Communication with Friends and Family: More than three times a week    Frequency of Social Gatherings with Friends and Family: Once a week    Attends Religious Services: 1 to 4 times per year    Active Member of [employer] or Organizations: No    Attends Archivist Meetings: Never    Marital Status: Divorced   Additional Social History:    Allergies:   Allergies  Allergen Reactions   Morpholine Salicylate Other (See Comments)    Hallucinations   Penicillins Other (See Comments)    Allergic- reaction?? Has patient had a PCN reaction causing immediate rash, facial/tongue/throat swelling, SOB or lightheadedness with hypotension: Unk Has patient had a PCN reaction causing severe  rash involving mucus membranes or skin necrosis: Unk Has patient had a PCN reaction that required hospitalization: Unk Has patient had a PCN reaction occurring within the last 10 years: No If all of the above answers are "NO", then may proceed with Cephalosporin use.   Morphine And Related Other (See Comments)    Hallucinations    Sglt2 Inhibitors Rash and Other (See Comments)    Candidiasis infection prone   Statins Rash and Other (See Comments)    Severe rash and back pain, and muscle pain    Labs:  Results for orders placed or performed during the hospital encounter of 03/17/22 (from the past 48 hour(s))  Comprehensive metabolic panel     Status: Abnormal   Collection Time: 03/17/22  8:30 PM  Result Value Ref Range   Sodium 135 135 - 145 mmol/L   Potassium 3.4 (L) 3.5 - 5.1 mmol/L   Chloride 96 (L) 98 - 111 mmol/L   CO2 29 22 - 32 mmol/L   Glucose, Bld 279 (H) 70 - 99 mg/dL    Comment: Glucose reference range applies only to samples taken after fasting for at least 8 hours.   BUN 16 8 - 23 mg/dL   Creatinine, Ser 1.09 0.61 - 1.24 mg/dL   Calcium 9.2 8.9 - 10.3 mg/dL   Total Protein 7.5 6.5 - 8.1 g/dL   Albumin 3.6 3.5 - 5.0 g/dL   AST 19 15 - 41 U/L   ALT 12 0 - 44 U/L   Alkaline Phosphatase 71 38 - 126 U/L   Total Bilirubin 0.6 0.3 -  1.2 mg/dL   GFR, Estimated >60 >60 mL/min    Comment: (NOTE) Calculated using the CKD-EPI Creatinine Equation (2021)    Anion gap 10 5 - 15    Comment: Performed at [facility], [place] [address] [place], [place] [address]  Ethanol     Status: None   Collection Time: 03/17/22  8:30 PM  Result Value Ref Range   Alcohol, Ethyl (B) <10 <10 mg/dL    Comment: (NOTE) Lowest detectable limit for serum alcohol is 10 mg/dL.  For medical purposes only. Performed at [facility], [place] [address], [place], [place] [address]   CBC with Diff     Status: Abnormal   Collection Time: 03/17/22  8:30 PM  Result Value Ref Range   WBC 2.8  (L) 4.0 - 10.5 K/uL   RBC 3.16 (L) 4.22 - 5.81 MIL/uL   Hemoglobin 10.1 (L) 13.0 - 17.0 g/dL   HCT 31.0 (L) 39.0 - 52.0 %   MCV 98.1 80.0 - 100.0 fL   MCH 32.0 26.0 - 34.0 pg   MCHC 32.6 30.0 - 36.0 g/dL   RDW 16.1 (H) 11.5 - 15.5 %   Platelets 287 150 - 400 K/uL   nRBC 0.0 0.0 - 0.2 %   Neutrophils Relative % 60 %   Neutro Abs 1.7 1.7 - 7.7 K/uL   Lymphocytes Relative 32 %   Lymphs Abs 0.9 0.7 - 4.0 K/uL   Monocytes Relative 4 %   Monocytes Absolute 0.1 0.1 - 1.0 K/uL   Eosinophils Relative 2 %   Eosinophils Absolute 0.1 0.0 - 0.5 K/uL   Basophils Relative 1 %   Basophils Absolute 0.0 0.0 - 0.1 K/uL   Immature Granulocytes 1 %   Abs Immature Granulocytes 0.02 0.00 - 0.07 K/uL    Comment: Performed at [facility], [address] [place], [place] [address]  Urine rapid drug screen (hosp performed)     Status: None   Collection Time: 03/17/22  8:33 PM  Result Value Ref Range   Opiates NONE DETECTED NONE DETECTED   Cocaine NONE DETECTED NONE DETECTED   Benzodiazepines NONE DETECTED NONE DETECTED   Amphetamines NONE DETECTED NONE DETECTED   Tetrahydrocannabinol NONE DETECTED NONE DETECTED   Barbiturates NONE DETECTED NONE DETECTED    Comment: (NOTE) DRUG SCREEN FOR MEDICAL PURPOSES ONLY.  IF CONFIRMATION IS NEEDED FOR ANY PURPOSE, NOTIFY LAB WITHIN 5 DAYS.  LOWEST DETECTABLE LIMITS FOR URINE DRUG SCREEN Drug Class                     Cutoff (ng/mL) Amphetamine and metabolites    1000 Barbiturate and metabolites    200 Benzodiazepine                 200 Opiates and metabolites        300 Cocaine and metabolites        300 THC                            50 Performed at [facility], [place] [address], [place],  [address]     Current Facility-Administered Medications  Medication Dose Route Frequency Provider Last Rate Last Admin   acetaminophen (TYLENOL) tablet 650 mg  650 mg Oral Q4H PRN Maudie Flakes, MD       apixaban Arne Cleveland) tablet 5 mg  5 mg  Oral BID Gerlene Fee  M, MD   5 mg at 03/18/22 0947   benzocaine (ORAJEL) 10 % mucosal gel 1 Application  1 Application Mouth/Throat QID PRN Maudie Flakes, MD       escitalopram (LEXAPRO) tablet 10 mg  10 mg Oral Daily Maudie Flakes, MD   10 mg at 03/18/22 0947   furosemide (LASIX) tablet 40 mg  40 mg Oral BID Maudie Flakes, MD   40 mg at 03/18/22 0852   insulin aspart (novoLOG) injection 5 Units  5 Units Subcutaneous TID WC Maudie Flakes, MD   5 Units at 03/18/22 0857   insulin glargine-yfgn (SEMGLEE) injection 26 Units  26 Units Subcutaneous QHS Maudie Flakes, MD       ipratropium-albuterol (DUONEB) 0.5-2.5 (3) MG/3ML nebulizer solution 3 mL  3 mL Inhalation Q6H PRN Maudie Flakes, MD       metoprolol succinate (TOPROL-XL) 24 hr tablet 12.5 mg  12.5 mg Oral Daily Maudie Flakes, MD   12.5 mg at 03/18/22 0946   polyethylene glycol (MIRALAX / GLYCOLAX) packet 17 g  17 g Oral Daily Maudie Flakes, MD   17 g at 03/18/22 2458   spironolactone (ALDACTONE) tablet 12.5 mg  12.5 mg Oral Daily Maudie Flakes, MD       valACYclovir Estell Harpin) tablet 1,000 mg  1,000 mg Oral BID Maudie Flakes, MD   1,000 mg at 03/18/22 0998   vitamin B-12 (CYANOCOBALAMIN) tablet 100 mcg  100 mcg Oral Daily Maudie Flakes, MD   100 mcg at 03/18/22 3382   Current Outpatient Medications  Medication Sig Dispense Refill   acetaminophen (TYLENOL) 325 MG tablet Take 2 tablets (650 mg total) by mouth every 4 (four) hours as needed for headache or mild pain.     apixaban (ELIQUIS) 5 MG TABS tablet Take 1 tablet by mouth twice daily (Patient taking differently: Take 5 mg by mouth 2 (two) times daily.) 180 tablet 1   escitalopram (LEXAPRO) 10 MG tablet Take 1 tablet (10 mg total) by mouth daily. 90 tablet 0   furosemide (LASIX) 40 MG tablet Take 40 mg by mouth 2 (two) times daily.     insulin aspart (NOVOLOG FLEXPEN) 100 UNIT/ML FlexPen INJECT 5 UNITS UNDER THE SKIN THREE TIMES DAILY WITH MEALS 45 mL 0   insulin  glargine-yfgn (SEMGLEE) 100 UNIT/ML injection Inject 0.26 mLs (26 Units total) into the skin at bedtime. 10 mL 11   ipratropium-albuterol (DUONEB) 0.5-2.5 (3) MG/3ML SOLN Inhale 3 mLs into the lungs every 6 (six) hours as needed (for shortness of breath).     metFORMIN (GLUCOPHAGE) 500 MG tablet Take 1 tablet (500 mg total) by mouth 2 (two) times daily with a meal. 90 tablet 3   metoprolol succinate (TOPROL-XL) 25 MG 24 hr tablet Take 0.5 tablets (12.5 mg total) by mouth daily. 15 tablet 2   polyethylene glycol (MIRALAX / GLYCOLAX) 17 g packet Take 17 g by mouth daily. 14 each 0   REPATHA SURECLICK 505 MG/ML SOAJ INJECT 1 PEN INTO THE SKIN EVERY 14 (FOURTEEN) DAYS. (Patient taking differently: Inject 140 mg into the skin every 14 (fourteen) days. Every other Friday) 2 mL 11   spironolactone (ALDACTONE) 25 MG tablet Take 0.5 tablets (12.5 mg total) by mouth daily.     valACYclovir (VALTREX) 1000 MG tablet Take 1,000 mg by mouth 2 (two) times daily.     vitamin B-12 (CYANOCOBALAMIN) 100 MCG tablet Take 100 mcg by mouth daily.  Accu-Chek Softclix Lancets lancets Use as instructed 100 each 12   benzocaine (ORAJEL) 10 % mucosal gel Use as directed 1 Application in the mouth or throat 4 (four) times daily as needed for mouth pain.     glucose blood (ACCU-CHEK GUIDE) test strip Use as instructed 100 each 12   Insulin Pen Needle 31G X 5 MM MISC Use one pen needle three times daily. Dx E11.49 100 each 3   Psychiatric Specialty Exam: Presentation  General Appearance:  Appropriate for Environment  Eye Contact: Fair  Speech: Clear and Coherent  Speech Volume: Normal  Handedness:No data recorded  Mood and Affect  Mood: Euthymic  Affect: Congruent   Thought Process  Thought Processes: Coherent  Descriptions of Associations:Intact  Orientation:Full (Time, Place and Person)  Thought Content:WDL  History of Schizophrenia/Schizoaffective disorder:No data recorded Duration of  Psychotic Symptoms:No data recorded Hallucinations:Hallucinations: None  Ideas of Reference:None  Suicidal Thoughts:Suicidal Thoughts: No  Homicidal Thoughts:Homicidal Thoughts: No   Sensorium  Memory: Immediate Fair; Recent Fair  Judgment: Fair  Insight: Poor   Executive Functions  Concentration: Fair  Attention Span: Fair  Recall: [employer] of Knowledge: Fair  Language: Fair   Psychomotor Activity  Psychomotor Activity: Psychomotor Activity: Other (comment) (at baseline)   Assets  Assets: Social Support; Resilience    Sleep  Sleep: Sleep: Poor   Physical Exam: Physical Exam Neurological:     Mental Status: He is alert and oriented to person, place, and time.  Psychiatric:        Attention and Perception: Attention normal.        Mood and Affect: Mood normal.        Speech: Speech normal.        Behavior: Behavior is cooperative.        Thought Content: Thought content normal.    Review of Systems  Psychiatric/Behavioral:  The patient has insomnia.        Aggressive outbursts  All other systems reviewed and are negative.  Blood pressure (!) 126/92, pulse 91, temperature (!) 97.5 F (36.4 C), temperature source Oral, resp. rate 16, SpO2 100 %. There is no height or weight on file to calculate BMI.  Medical Decision Making: Pt case reviewed and discussed with Dr. Dwyane Dee. Pt does not meet criteria for IVC or inpatient psychiatric treatment. It is likely patient has underlying dementia as well as plethora of other health concerns causing his agitation and anxiety. Will start Seroquel XR 50 mg Qhs to help aid aggression and mood stability. Pt is able to contract for safety, and LG is agreeable to plan. Will psychiatrically clear patient.   - Will send 14 day supply of Seroquel XR 50 mg Qhs - AVS includes mental health crisis resources, OP follow up, and therapy  Disposition:  psych cleared  Vesta Mixer, NP 03/18/2022 11:36 AM

## 2022-03-18 NOTE — ED Notes (Signed)
Patient repeatedly  getting out of bed having to be re-directed, states he isn't getting any oxygen, however his sats are 99-100 %

## 2022-03-18 NOTE — Discharge Instructions (Signed)

## 2022-03-18 NOTE — ED Notes (Signed)
Patient will not sit in bed. Pt redirected to his room and encouraged to stay on monitor. RN is aware.
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
## 2022-03-18 NOTE — ED Provider Notes (Signed)
Patient has been sick logically clear.  His labs are reviewed and similar to baseline values.  Vital signs are stable.  He is calm and conversational.  He is cleared for discharge back to [place] facility.   Mark Johns, MD 03/18/22 1233

## 2022-03-20 ENCOUNTER — Other Ambulatory Visit (HOSPITAL_COMMUNITY): Payer: Self-pay

## 2022-03-20 ENCOUNTER — Telehealth: Payer: Self-pay

## 2022-03-20 NOTE — Telephone Encounter (Signed)
Pharmacy Patient Advocate Encounter   Received notification from [place] that prior authorization for REPATHA 140 MG/ML INJ is needed.    PA submitted on 03/20/22 Key [id number] Status is pending  Karie Soda, [place] Patient Advocate Specialist Direct Number: [phone] Fax: [phone]

## 2022-03-20 NOTE — Telephone Encounter (Signed)
Pharmacy Patient Advocate Encounter  Received notification from [place] that the request for prior authorization for REPATHA 140 MG/ML INJ has been denied due to PLAN PREFERS [place].      PLEASE ADVISE   Karie Soda, CPhT Pharmacy Patient Advocate Specialist Direct Number: [phone] Fax: [phone]

## 2022-03-21 ENCOUNTER — Encounter: Payer: Self-pay | Admitting: Pharmacist

## 2022-03-21 ENCOUNTER — Other Ambulatory Visit (HOSPITAL_COMMUNITY): Payer: Self-pay

## 2022-03-21 MED ORDER — PRALUENT 75 MG/ML ~~LOC~~ SOAJ: 1.0000 | SUBCUTANEOUS | 11 refills | Status: DC

## 2022-03-21 NOTE — Telephone Encounter (Signed)
Tried calling pt to notify him of formulary change from Repatha to Praluent, no answer. Will send him a MyChart message with med update. New rx for Praluent has been sent to his pharmacy.

## 2022-03-21 NOTE — Telephone Encounter (Signed)
Pharmacy Patient Advocate Encounter  Prior Authorization for [place] has been approved.    PA# [id number] Effective dates: 03/21/22 through 03/12/2098  Per RPH, okay to submit PA for Praluent (preferred).    Received notification from [place] that prior authorization for [place] is needed.    PA submitted on 03/21/22 Key [id number] Status is pending  Karie Soda, [place] Patient Advocate Specialist Direct Number: [phone] Fax: [phone]

## 2022-03-21 NOTE — Addendum Note (Signed)
Addended by: Harwood Nall E on: 03/21/2022 11:02 AM   Modules accepted: Orders

## 2022-03-24 DIAGNOSIS — F411 Generalized anxiety disorder: Secondary | ICD-10-CM | POA: Diagnosis not present

## 2022-03-24 DIAGNOSIS — I5030 Unspecified diastolic (congestive) heart failure: Secondary | ICD-10-CM | POA: Diagnosis not present

## 2022-03-24 DIAGNOSIS — K7460 Unspecified cirrhosis of liver: Secondary | ICD-10-CM | POA: Diagnosis not present

## 2022-03-24 DIAGNOSIS — F01518 Vascular dementia, unspecified severity, with other behavioral disturbance: Secondary | ICD-10-CM | POA: Diagnosis not present

## 2022-03-24 DIAGNOSIS — I482 Chronic atrial fibrillation, unspecified: Secondary | ICD-10-CM | POA: Diagnosis not present

## 2022-03-27 ENCOUNTER — Encounter: Payer: Self-pay | Admitting: Family Medicine

## 2022-03-27 ENCOUNTER — Non-Acute Institutional Stay: Payer: Medicare Other | Admitting: Family Medicine

## 2022-03-27 VITALS — BP 100/60 | HR 64 | Temp 98.2°F | Resp 20

## 2022-03-27 DIAGNOSIS — Z515 Encounter for palliative care: Secondary | ICD-10-CM | POA: Diagnosis not present

## 2022-03-27 DIAGNOSIS — R4189 Other symptoms and signs involving cognitive functions and awareness: Secondary | ICD-10-CM

## 2022-03-27 DIAGNOSIS — K7460 Unspecified cirrhosis of liver: Secondary | ICD-10-CM

## 2022-03-27 NOTE — Progress Notes (Signed)
[facility] Palliative Care Consult Note Telephone: [phone]  Fax: [phone]   Date of encounter: 03/27/22 10:30 PM PATIENT NAME: Mark Cabrera [address] [address]   [phone] (home)  DOB: 08-09-1950 MRN: [id number] PRIMARY CARE PROVIDER:    [name], MD,  [place] [address] [phone]  REFERRING PROVIDER:   [name], MD [address] [place],  [place] [address] [phone]  RESPONSIBLE PARTY:    Contact Information     Name Relation Home Work [place] Daughter [phone]  [phone]   Alfonso Ramus   [phone]   KIMM, UNGARO   [phone]        I met face to face with patient in his skilled nursing facility. Palliative Care was asked to follow this patient by consultation request of  [name], MD to address advance care planning and complex medical decision making. This is the initial visit.          ASSESSMENT, SYMPTOM MANAGEMENT AND PLAN / RECOMMENDATIONS:   Cognitive impairment with hx of delirium In the last year, RPR/HIV NR and vitamin B12 normal. Head CT with microvascular changes and atrophy consistent with dementia, likely vascular at least in part. Recommend attempting MMSE/MoCA for baseline. Target blood glucose control to goal of HGB A1c of 7.5-8%   Hepatic cirrhosis Last ammonia level done in 2022 was low and pt is not on lactulose, would recommend if acute confusion noted or at next lab draw to obtain ammonia level. Pancytopenia likely relative to cirrhosis. 02/27/22 MELD-Na score 12  Hypomagnesemia Last Mag level low at 1.6, has hx of low Mag and potassium. Recommend addition of Mag Ox 400 mg daily and will aid maintenance of K+ already being supplemented    Palliative Care Encounter Initiate discussions with legal guardian over expected course of illnesses to begin further discussions of goals of care.  Follow up  Palliative Care Visit: Palliative care will continue to follow for complex medical decision making, advance care planning, and clarification of goals. Return 4 weeks or prn.    This visit was coded based on medical decision making (MDM).  PPS: 60%  HOSPICE ELIGIBILITY/DIAGNOSIS: TBD  Chief Complaint:  Palliative Care received a referral to follow patient at SNF with hx of chronic heart failure in setting of vascular dementia.  HISTORY OF PRESENT ILLNESS:  Mark Cabrera is a 72 y.o. year old male with chronic diastolic heart failure, vascular dementia, hypertensive heart disease, CAD, paroxysmal atrial fibrillation, history of stroke, sleep apnea, left pleural effusion, hepatic cirrhosis, diabetes with neurological manifestations, bilateral lower extremity venous stasis dermatitis. He is not a reliable historian as he sometimes will answer the question appropriately but at other times he gives a vague tangential answer that is not related to the question asked. He denies pain, SOB, nausea, dysuria or constipation.  He ate a full dinner.  He states needing to open the window "to be able to breathe".  Per facility staff he will apply and take off his O2, is incontinent intermittently of bladder, continent of bowel. Per facility staff, blood sugars still remain elevated in the 200 range for the majority of the time, no hypoglycemia. Observed pt laying flat on his left side with head at the foot of the bed. History obtained from review of EMR, discussion with facility staff and/or Mr. Pop.   03/17/22 Blood and urine toxicology: negative (cocaine tested in both blood and urine), alcohol  negative    Latest Ref Rng & Units 03/17/2022    8:30 PM 02/27/2022    4:40 AM 02/26/2022   12:29 PM  CBC  WBC 4.0 - 10.5 K/uL 2.8  2.3  2.7   Hemoglobin 13.0 - 17.0 g/dL 10.1  9.9  9.5   Hematocrit 39.0 - 52.0 % 31.0  31.1  29.7   Platelets 150 - 400 K/uL 287  236  223       Latest Ref Rng & Units 03/17/2022     8:30 PM 03/03/2022    7:36 AM 03/02/2022   12:32 AM  CMP  Glucose 70 - 99 mg/dL 279  134  279   BUN 8 - 23 mg/dL '16  19  18   '$ Creatinine 0.61 - 1.24 mg/dL 1.09  0.99  1.10   Sodium 135 - 145 mmol/L 135  140  135   Potassium 3.5 - 5.1 mmol/L 3.4  3.2  3.6   Chloride 98 - 111 mmol/L 96  106  100   CO2 22 - 32 mmol/L '29  26  27   '$ Calcium 8.9 - 10.3 mg/dL 9.2  8.3  8.7   Total Protein 6.5 - 8.1 g/dL 7.5     Total Bilirubin 0.3 - 1.2 mg/dL 0.6     Alkaline Phos 38 - 126 U/L 71     AST 15 - 41 U/L 19     ALT 0 - 44 U/L 12        03/03/22 Magnesium low at 1.6  03/02/22 pH of pleural fluid 7.6 with no reference range given 03/02/22  A. PLEURAL FLUID, LEFT, THORACENTESIS:   FINAL MICROSCOPIC DIAGNOSIS:  - No malignant cells identified  - Chronic inflammatory cells with benign/reactive mesothelial cells   SPECIMEN ADEQUACY:  Satisfactory for evaluation   02/28/22 right pleural fluid analysis:  PLEU RIGHT VC FLUID CM    Comment: CORRECTED ON 12/19 AT 1536: PREVIOUSLY REPORTED AS Lung, Right  Color, Fluid YELLOW YELLOW Abnormal  AMBER Abnormal   Appearance, Fluid CLEAR CLEAR Abnormal  CLOUDY Abnormal   Total Nucleated Cell Count, Fluid 0 - 1,000 cu mm 200 595  Neutrophil Count, Fluid 0 - 25 % 12 17  Lymphs, Fluid % 84 78  Monocyte-Macrophage-Serous Fluid 50 - 90 % 4 Low  5 Low   Other Cells, Fluid % OTHER CELLS IDENTIFIED AS MESOTHELIAL CELLS     02/27/22 Elevated PT 17.5/INR 1.4 02/26/22 BNP elevated at 373.4 02/26/22 Resp panel negative for Influenza A & B, RSV and SARS-CoV-2  01/17/22 HGB A1c >14.0  09/22/21 Vitamin B12 sufficient at 460  02/28/22 2d echo:  1. Left ventricular EF  30 to 35%. LV with moderately decreased function and regional wall motion abnormalities. LV diastolic function could not be evaluated.   2. Right ventricular systolic function is moderately reduced. RV size is normal. There is mildly elevated PASP.  Estimated RVSP is 38.2 mmHg.   3. Left atrium  mild to moderately dilated.   4. The mitral valve is grossly normal. Moderate mitral valve regurgitation without stenosis.   5. The aortic valve is tricuspid without regurgitation or stenosis.  6. The inferior vena cava is dilated in size with <50% respiratory variability, suggesting right atrial pressure of 15 mmHg.   02/27/22 CT chest wo contrast: IMPRESSION: 1. Trace pulmonary fibrosis throughout the lungs likely due to prior COVID infection. Superimposed developing patchy airspace opacities that could represent acute infection/inflammation not fully excluded. 2. Bilateral moderate pleural  effusions. 3. Nonspecific scattered subpleural pulmonary micronodules. No follow-up needed if patient is low-risk (and has no known or suspected primary neoplasm). Non-contrast chest CT can be considered in 12 months if patient is high-risk.  4. Aortic Atherosclerosis (ICD10-I70.0) including four-vessel coronary artery calcification.  01/27/22 CT ABD/pelvis with contrast: IMPRESSION: 1. Hepatic steatosis with cirrhosis. 2. Mild splenomegaly. 3. Small fat containing left inguinal hernia. 4. Aortic atherosclerosis.  01/17/22 CT head wo contrast: IMPRESSION: 1. No acute intracranial abnormality. 2. Small chronic left occipital lobe infarct. 3. Generalized cerebral atrophy with widening of the extra-axial spaces and ventricular dilatation.  I reviewed EMR for available labs, medications, imaging, studies and related documents.  No new records since last visit/Records reviewed and summarized above.   ROS Limited due to dementia  Physical Exam: Current and past weights: 187 lbs 12.8 oz as of 03/03/22, weight 170.7 lbs on 01/17/22 Constitutional: NAD General: mildly disheveled, obese  EYES: anicteric sclera, lids intact, no discharge  ENMT: intact hearing CV: S1S2, mildly irreg with LUSB murmur, no LE edema Pulmonary: Clear but diminished in bases without rales, rhonchi or wheezes, no increased work  of breathing, on O2 @ 2.5 L [place] Abdomen: normo-active BS + 4 quadrants, soft and non tender, no ascites GU: deferred MSK: no sarcopenia, moves all extremities, ambulatory Skin: warm and dry, no rashes or wounds on visible skin Neuro:  no generalized weakness, noted cognitive impairment Psych: non-anxious affect, A and O to self Hem/lymph/immuno: no widespread bruising  CURRENT PROBLEM LIST:  Patient Active Problem List   Diagnosis Date Noted   Aggression 03/18/2022   Acute exacerbation of CHF (congestive heart failure) ([place]) 02/28/2022   Acute venous stasis dermatitis of both lower extremities 02/27/2022   Acute on chronic diastolic heart failure with preserved ejection fraction ([place]) 02/26/2022   Pressure injury of skin 01/24/2022   Hyperglycemia 01/17/2022   Neutropenia (HCC)    Fever, unspecified    Malnutrition of moderate degree 09/23/2021   Physical deconditioning 09/21/2021   Otitis externa 12/31/2020   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   Balanitis 96/78/9381   Complicated urinary tract infection    Acute kidney injury ([place])    Thrombocytopenia (HCC)    Hyponatremia 11/08/2020   Left shoulder pain 10/20/2020   Dyspnea 09/23/2020   Cognitive impairment with history of delirium 08/25/2020   Hip pain, chronic, left 07/05/2020   Epididymitis 04/14/2020   Fatigue 03/18/2020   Hypotension 03/10/2020   Abdominal wall bulge 12/03/2019   Dermoid inclusion cyst 08/07/2019   Chronic left shoulder pain 05/13/2019   Depression 11/05/2018   Blurry vision, bilateral 09/20/2018   CVA (cerebral vascular accident) ([place]) 06/28/2018   Hepatic cirrhosis ([place]) 06/29/2017   Healthcare maintenance 12/16/2016   Lipoma of left upper extremity 09/12/2016   Chronic knee pain 07/04/2016   Decreased visual acuity 07/04/2016   Pleural effusion, left 04/28/2016   Paroxysmal atrial fibrillation (HCC) 04/16/2016   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/31/2016   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    CAD (coronary artery disease), native coronary artery    Chronic diastolic heart failure (HCC)    Sleep apnea 08/02/2009   Diabetes mellitus with neurological manifestation ([place]) 10/18/2007   Dyslipidemia 05/18/2006   Hypertensive heart disease    PAST MEDICAL HISTORY:  Active Ambulatory Problems    Diagnosis Date Noted   Diabetes mellitus with neurological manifestation ([place]) 10/18/2007   Dyslipidemia 05/18/2006   Hypertensive heart disease    Sleep apnea 08/02/2009  Chronic diastolic heart failure (HCC)    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/31/2016   CAD (coronary artery disease), native coronary artery    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   Paroxysmal atrial fibrillation (HCC) 04/16/2016   Pleural effusion, left 04/28/2016   Chronic knee pain 07/04/2016   Decreased visual acuity 07/04/2016   Lipoma of left upper extremity 09/12/2016   Healthcare maintenance 12/16/2016   Hepatic cirrhosis ([place]) 06/29/2017   CVA (cerebral vascular accident) ([place]) 06/28/2018   Blurry vision, bilateral 09/20/2018   Depression 11/05/2018   Chronic left shoulder pain 05/13/2019   Dermoid inclusion cyst 08/07/2019   Abdominal wall bulge 12/03/2019   Hypotension 03/10/2020   Fatigue 03/18/2020   Epididymitis 04/14/2020   Hip pain, chronic, left 07/05/2020   Cognitive impairment with history of delirium 08/25/2020   Dyspnea 09/23/2020   Left shoulder pain 10/20/2020   Hyponatremia 16/03/930   Complicated urinary tract infection    Acute kidney injury ([place])    Thrombocytopenia ([place])    Balanitis 11/19/2020   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   Otitis externa 12/31/2020   Physical deconditioning 09/21/2021   Malnutrition of moderate degree 09/23/2021   Fever, unspecified    Neutropenia (HCC)    Hyperglycemia 01/17/2022   Pressure injury of skin 01/24/2022   Acute on chronic diastolic heart failure with preserved ejection fraction ([place]) 02/26/2022   Acute venous stasis dermatitis of  both lower extremities 02/27/2022   Acute exacerbation of CHF (congestive heart failure) ([place]) 02/28/2022   Aggression 03/18/2022   Resolved Ambulatory Problems    Diagnosis Date Noted   REFLEX SYMPATHETIC DYSTROPHY 08/02/2009   History of dissection of vertebral artery  ([place]) 08/02/2005   GASTROESOPHAGEAL REFLUX DISEASE 04/26/2006   CONSTIPATION NOS 05/18/2006   Other chronic nonalcoholic liver disease 35/57/3220   JOINT EFFUSION, RIGHT KNEE 07/11/2006   MENISCUS TEAR, RIGHT 07/11/2006   Preventative health care 05/02/2010   Osteoarthritis, knee    Seborrheic keratosis 04/17/2011   Recent head trauma 10/17/2012   Acute on chronic diastolic heart failure ([place]) 04/16/2016   Persistent atrial fibrillation ([place]) 04/16/2016   Anemia due to blood loss, requiring transfusion of PRBCs 04/16/2016   Chest pain    Pericardial effusion a. subxiphoid pericardial window on 04/21/2016 04/28/2016   Encounter for therapeutic drug monitoring 05/01/2016   Penile pain 05/12/2016   Peripheral neuropathic pain 05/12/2016   Fatigue 06/09/2016   Pain, dental 07/04/2016   Candida albicans infection 01/09/2017   Occipital cerebral infarction ([place]) 07/01/2018   Rotator cuff syndrome, left    Pain in left testicle 08/07/2019   Orthopnea 12/03/2019   COVID-19 02/22/2020   Acute respiratory failure with hypoxia ([place])    Suicidal ideation    Pneumonia due to COVID-19 virus 02/23/2020   Type 2 diabetes mellitus with hyperglycemia, with long-term current use of insulin ([place]) 02/24/2020   Acute respiratory failure with hypoxia ([place]) 03/18/2020   Weakness 11/08/2020   CAP (community acquired pneumonia) 11/09/2020   Left lower quadrant pain    Hypokalemia    Symptomatic hypotension 09/22/2021   Past Medical History:  Diagnosis Date   Acute diastolic congestive heart failure ([place])    AKI (acute kidney injury) ([place])    Aortic valve stenosis s/p AVR 2018   Atrial fibrillation ([place]) - post-op CABG     Atypical nevi    Coronary artery disease s/p 2 vessel CABG    Diabetes mellitus    GERD (gastroesophageal reflux disease)  Heart murmur    History of kidney stones    Hyperlipidemia    Hypertension    Nephrolithiasis    Stroke [facility]) 2007   Transaminitis    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 Has patient had a PCN reaction causing immediate rash, facial/tongue/throat swelling, SOB or lightheadedness with hypotension: Unk Has patient had a PCN reaction causing severe rash involving mucus membranes or skin necrosis: Unk Has patient had a PCN reaction that required hospitalization: Unk Has patient had a PCN reaction occurring within the last 10 years: No If all of the above answers are "NO", then may proceed with Cephalosporin use.   Morphine And Related Other (See Comments)    Hallucinations    Sglt2 Inhibitors Rash and Other (See Comments)    Candidiasis infection prone   Statins Rash and Other (See Comments)    Severe rash and back pain, and muscle pain     PERTINENT MEDICATIONS:  Outpatient Encounter Medications as of 03/27/2022  Medication Sig   Alirocumab (PRALUENT) 75 MG/ML SOAJ Inject 1 Pen into the skin every 14 (fourteen) days.   Accu-Chek Softclix Lancets lancets Use as instructed   acetaminophen (TYLENOL) 325 MG tablet Take 2 tablets (650 mg total) by mouth every 4 (four) hours as needed for headache  or mild pain.   apixaban (ELIQUIS) 5 MG TABS tablet Take 1 tablet by mouth twice daily (Patient taking differently: Take 5 mg by mouth 2 (two) times daily.)   benzocaine (ORAJEL) 10 % mucosal gel Use as directed 1 Application in the mouth or throat 4 (four) times daily as needed for mouth pain.   escitalopram (LEXAPRO) 10 MG tablet Take 1 tablet (10 mg total) by mouth daily.   furosemide (LASIX) 40 MG tablet Take 40 mg by mouth 2 (two) times daily.   glucose blood (ACCU-CHEK GUIDE) test strip Use as instructed   insulin aspart (NOVOLOG FLEXPEN) 100 UNIT/ML FlexPen INJECT 5 UNITS UNDER THE SKIN THREE TIMES DAILY WITH MEALS   insulin glargine-yfgn (SEMGLEE) 100 UNIT/ML injection Inject 0.26 mLs (26 Units total) into the skin at bedtime.   Insulin Pen Needle 31G X 5 MM MISC Use one pen needle three times daily. Dx E11.49   ipratropium-albuterol (DUONEB) 0.5-2.5 (3) MG/3ML SOLN Inhale 3 mLs into the lungs every 6 (six) hours as needed (for shortness of breath).   metFORMIN (GLUCOPHAGE) 500 MG tablet Take 1 tablet (500 mg total) by mouth 2 (two) times daily with a meal.   metoprolol succinate (TOPROL-XL) 25 MG 24 hr tablet Take 0.5 tablets (12.5 mg total) by mouth daily.   polyethylene glycol (MIRALAX / GLYCOLAX) 17 g packet Take 17 g by mouth daily.   QUEtiapine (SEROQUEL XR) 50 MG TB24 24 hr tablet Take 1 tablet (50 mg total) by mouth at bedtime  for 14 days.   spironolactone (ALDACTONE) 25 MG tablet Take 0.5 tablets (12.5 mg total) by mouth daily.   valACYclovir (VALTREX) 1000 MG tablet Take 1,000 mg by mouth 2 (two) times daily.   vitamin B-12 (CYANOCOBALAMIN) 100 MCG tablet Take 100 mcg by mouth daily.   No facility-administered encounter medications on file as of 03/27/2022.     -------------------------------------------------------------------- Advance Care Planning/Goals of Care:  Exploration of goals of care in the event of a sudden injury or illness-per documented discussion in Epic pt is  DNR, full medical intervention, meds an IV fluids as indicated Identification  of a healthcare agent-legal guardian Devine Klingel  CODE STATUS: DNR with intubation    Thank you for the opportunity to participate in the care of Mr. Harshberger.  The palliative care team will continue to follow. Please call our office at [phone] if we can be of additional assistance.   Marijo Conception, FNP-C  COVID-19 PATIENT SCREENING TOOL Asked and negative response unless otherwise noted:  Have you had symptoms of covid, tested positive or been in contact with someone with symptoms/positive test in the past 5-10 days? Unknown

## 2022-03-30 ENCOUNTER — Other Ambulatory Visit: Payer: Self-pay | Admitting: *Deleted

## 2022-03-30 NOTE — Patient Outreach (Signed)
[facility] Post- Acute Care Coordinator follow up. [name] resides in [facility] skilled nursing facility. Screening for potential [facility] care coordination services as benefit of insurance plan and Primary Care Provider.   Spoke with Florentina Jenny, Scientist, forensic. [name] is a long term care resident.   No identifiable [facility] care coordination needs.   [name], MSN, RN,BSN [place] Acute Care Coordinator [phone] (Direct dial)

## 2022-04-08 DIAGNOSIS — Z515 Encounter for palliative care: Secondary | ICD-10-CM | POA: Insufficient documentation

## 2022-04-18 DIAGNOSIS — F411 Generalized anxiety disorder: Secondary | ICD-10-CM | POA: Diagnosis not present

## 2022-04-18 DIAGNOSIS — E119 Type 2 diabetes mellitus without complications: Secondary | ICD-10-CM | POA: Diagnosis not present

## 2022-04-18 DIAGNOSIS — I482 Chronic atrial fibrillation, unspecified: Secondary | ICD-10-CM | POA: Diagnosis not present

## 2022-04-18 DIAGNOSIS — I5030 Unspecified diastolic (congestive) heart failure: Secondary | ICD-10-CM | POA: Diagnosis not present

## 2022-04-21 DIAGNOSIS — I509 Heart failure, unspecified: Secondary | ICD-10-CM | POA: Diagnosis not present

## 2022-04-21 DIAGNOSIS — K7460 Unspecified cirrhosis of liver: Secondary | ICD-10-CM | POA: Diagnosis not present

## 2022-04-21 DIAGNOSIS — E1165 Type 2 diabetes mellitus with hyperglycemia: Secondary | ICD-10-CM | POA: Diagnosis not present

## 2022-04-21 DIAGNOSIS — I4891 Unspecified atrial fibrillation: Secondary | ICD-10-CM | POA: Diagnosis not present

## 2022-04-24 DIAGNOSIS — I5030 Unspecified diastolic (congestive) heart failure: Secondary | ICD-10-CM | POA: Diagnosis not present

## 2022-04-24 DIAGNOSIS — F411 Generalized anxiety disorder: Secondary | ICD-10-CM | POA: Diagnosis not present

## 2022-04-24 DIAGNOSIS — E119 Type 2 diabetes mellitus without complications: Secondary | ICD-10-CM | POA: Diagnosis not present

## 2022-04-24 DIAGNOSIS — I482 Chronic atrial fibrillation, unspecified: Secondary | ICD-10-CM | POA: Diagnosis not present

## 2022-04-24 DIAGNOSIS — R4189 Other symptoms and signs involving cognitive functions and awareness: Secondary | ICD-10-CM | POA: Diagnosis not present

## 2022-04-25 DIAGNOSIS — I5030 Unspecified diastolic (congestive) heart failure: Secondary | ICD-10-CM | POA: Diagnosis not present

## 2022-04-25 DIAGNOSIS — D72819 Decreased white blood cell count, unspecified: Secondary | ICD-10-CM | POA: Diagnosis not present

## 2022-04-25 DIAGNOSIS — I482 Chronic atrial fibrillation, unspecified: Secondary | ICD-10-CM | POA: Diagnosis not present

## 2022-05-01 ENCOUNTER — Non-Acute Institutional Stay: Payer: Medicare Other | Admitting: Family Medicine

## 2022-05-01 VITALS — BP 118/66 | HR 74 | Temp 97.8°F | Resp 20

## 2022-05-01 DIAGNOSIS — R4189 Other symptoms and signs involving cognitive functions and awareness: Secondary | ICD-10-CM

## 2022-05-01 DIAGNOSIS — F321 Major depressive disorder, single episode, moderate: Secondary | ICD-10-CM

## 2022-05-01 DIAGNOSIS — I5032 Chronic diastolic (congestive) heart failure: Secondary | ICD-10-CM | POA: Diagnosis not present

## 2022-05-02 NOTE — Progress Notes (Signed)
[facility] Palliative Care Consult Note Telephone: [phone]  Fax: [phone]    Date of encounter: 05/01/2022 6:20 PM PATIENT NAME: Mark Cabrera [address] [place] [address]   [phone] (home)  DOB: 10-27-1950 MRN: [id number] PRIMARY CARE PROVIDER:    [name], MD,  [place] [address] [phone]  REFERRING PROVIDER:   [name], MD [address] [place],  [place] [address] [phone]  RESPONSIBLE PARTY:    Contact Information     Name Relation Home Work [place] Daughter [phone]  [phone]   Mark Cabrera   [phone]   Mark Cabrera, Mark Cabrera   [phone]        I met face to face with patient in [place] [facility] [place]. Palliative Care was asked to follow this patient by consultation request of  [name], MD to address advance care planning and complex medical decision making. This is a follow up visit   ASSESSMENT , SYMPTOM MANAGEMENT AND PLAN / RECOMMENDATIONS:   Advance Care Planning/Goals of Care: Goals include to maximize quality of life and symptom management.  CODE STATUS: As of 02/26/22 expressed wishes to hospital staff: If patient has no pulse and is not breathing Do Not Attempt Resuscitation  If patient has a pulse and/or is breathing: Medical Treatment Goals MEDICAL INTERVENTIONS DESIRED: Use advanced airway interventions, mechanical ventilation or cardioversion in appropriate circumstances; Use medication/IV fluids as indicated; Provide comfort medications; Transfer to Progressive/Stepdown/ICU as indicated.          Follow up Palliative Care Visit: Palliative care will continue to follow for complex medical decision making, advance care planning, and clarification of goals. Return 6-8 weeks or prn.    This visit was coded based on medical decision making (MDM).  PPS: ***0%  HOSPICE ELIGIBILITY/DIAGNOSIS: TBD  Chief  Complaint: ***  HISTORY OF PRESENT ILLNESS:  Mark Cabrera is a 72 y.o. year old male with *** .   History obtained from review of EMR, discussion with primary team, and interview with family, facility staff/caregiver and/or Mark Cabrera.  I reviewed EMR for available labs, medications, imaging, studies and related documents.  There are no new records since last visit/Records reviewed and summarized above.   ROS General: NAD ENMT: denies dysphagia Cardiovascular: denies chest pain, denies DOE.  Endorses PND, denies orthopnea and wears O2 @ night for PND Pulmonary: denies cough, denies increased SOB Abdomen: endorses good appetite, denies constipation, endorses continence of bowel GU: denies dysuria, endorses continence of urine MSK:  denies increased weakness,  no falls reported Skin: denies rashes or wounds Neurological: denies pain, denies insomnia Psych: Endorses depression/isolation over family having to work and being unable to care for him at home.  Expresses frustration with being unable to get anything more than "static or white noise on the tv" and being unable to have someone fix it for him.  Physical Exam: Current and past weights: 187 lbs 12.8 oz as of 03/03/22 Constitutional: NAD General: WNWD ENMT: intact hearing CV: S1S2, RRR, no LE edema, distal feet cool, dusky with 1+ pulses bilaterally Pulmonary: CTAB, no increased work of breathing, no cough, room air Abdomen: normo-active BS + 4 quadrants, soft and non tender GU: deferred MSK: no sarcopenia, moves all extremities, ambulatory with mildly unsteady gait, does not use walker Skin: warm and dry, no rashes or wounds on visible skin Neuro:  no generalized weakness, noted cognitive deficit Psych: non-anxious affect, mildly irritable and expressed  feeling isolated from family who is unable to care for him at home Hem/lymph/immuno: no widespread bruising   Thank you for the opportunity to participate in the care of Mr.  Cabrera.  The palliative care team will continue to follow. Please call our office at [phone] if we can be of additional assistance.   Marijo Conception, FNP -C  COVID-19 PATIENT SCREENING TOOL Asked and negative response unless otherwise noted:   Have you had symptoms of covid, tested positive or been in contact with someone with symptoms/positive test in the past 5-10 days?   Unknown

## 2022-05-03 ENCOUNTER — Encounter: Payer: Self-pay | Admitting: Family Medicine

## 2022-05-29 ENCOUNTER — Ambulatory Visit (INDEPENDENT_AMBULATORY_CARE_PROVIDER_SITE_OTHER): Payer: Medicare Other | Admitting: Neurology

## 2022-05-29 ENCOUNTER — Encounter: Payer: Self-pay | Admitting: Neurology

## 2022-05-29 VITALS — BP 137/80 | HR 63

## 2022-05-29 DIAGNOSIS — R5383 Other fatigue: Secondary | ICD-10-CM

## 2022-05-29 DIAGNOSIS — F03A18 Unspecified dementia, mild, with other behavioral disturbance: Secondary | ICD-10-CM | POA: Diagnosis not present

## 2022-05-29 MED ORDER — DONEPEZIL HCL 5 MG PO TABS: 5.0000 mg | ORAL_TABLET | Freq: Every day | ORAL | 11 refills | Status: DC
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
## 2022-05-29 NOTE — Patient Instructions (Addendum)
Dementia labs including CBC, CMP, B12 level, TSH and ATN profile Start Aricept 5 mg nightly  Continue your current medications Follow-up in 1 year or sooner if worse  There are well-accepted and sensible ways to reduce risk for Alzheimers disease and other degenerative brain disorders .  Exercise Daily Walk A daily 20 minute walk should be part of your routine. Disease related apathy can be a significant roadblock to exercise and the only way to overcome this is to make it a daily routine and perhaps have a reward at the end (something your loved one loves to eat or drink perhaps) or a personal trainer coming to the home can also be very useful. Most importantly, the patient is much more likely to exercise if the caregiver / spouse does it with him/her. In general a structured, repetitive schedule is best.  General Health: Any diseases which effect your body will effect your brain such as a pneumonia, urinary infection, blood clot, heart attack or stroke. Keep contact with your primary care doctor for regular follow ups.  Sleep. A good nights sleep is healthy for the brain. Seven hours is recommended. If you have insomnia or poor sleep habits we can give you some instructions. If you have sleep apnea wear your mask.  Diet: Eating a heart healthy diet is also a good idea; fish and poultry instead of red meat, nuts (mostly non-peanuts), vegetables, fruits, olive oil or canola oil (instead of butter), minimal salt (use other spices to flavor foods), whole grain rice, bread, cereal and pasta and wine in moderation.Research is now showing that the MIND diet, which is a combination of The Mediterranean diet and the DASH diet, is beneficial for cognitive processing and longevity. Information about this diet can be found in The MIND Diet, a book by Doyne Keel, MS, RDN, and online at [employer]book[employer].si  Finances, Power of Attorney and Advance Directives: You should consider putting  legal safeguards in place with regard to financial and medical decision making. While the spouse always has power of attorney for medical and financial issues in the absence of any form, you should consider what you want in case the spouse / caregiver is no longer around or capable of making decisions.

## 2022-06-01 LAB — COMPREHENSIVE METABOLIC PANEL
ALT: 9 IU/L (ref 0–44)
AST: 14 IU/L (ref 0–40)
Albumin/Globulin Ratio: 1.3 (ref 1.2–2.2)
Albumin: 4.1 g/dL (ref 3.8–4.8)
Alkaline Phosphatase: 93 IU/L (ref 44–121)
BUN/Creatinine Ratio: 18 (ref 10–24)
BUN: 17 mg/dL (ref 8–27)
Bilirubin Total: 0.6 mg/dL (ref 0.0–1.2)
CO2: 20 mmol/L (ref 20–29)
Calcium: 8.8 mg/dL (ref 8.6–10.2)
Chloride: 96 mmol/L (ref 96–106)
Creatinine, Ser: 0.97 mg/dL (ref 0.76–1.27)
Globulin, Total: 3.2 g/dL (ref 1.5–4.5)
Glucose: 263 mg/dL — ABNORMAL HIGH (ref 70–99)
Potassium: 3.9 mmol/L (ref 3.5–5.2)
Sodium: 135 mmol/L (ref 134–144)
Total Protein: 7.3 g/dL (ref 6.0–8.5)
eGFR: 82 mL/min/{1.73_m2} (ref >59)

## 2022-06-01 LAB — VITAMIN B12: Vitamin B-12: 719 pg/mL (ref 232–1245)

## 2022-06-01 LAB — ATN PROFILE
A -- Beta-amyloid 42/40 Ratio: 0.098 — ABNORMAL LOW (ref >0.102)
Beta-amyloid 40: 292.25 pg/mL
Beta-amyloid 42: 28.69 pg/mL
N -- NfL, Plasma: 2.95 pg/mL (ref 0.00–7.64)
T -- p-tau181: 0.64 pg/mL (ref 0.00–0.97)

## 2022-06-01 LAB — CBC
Hematocrit: 31.4 % — ABNORMAL LOW (ref 37.5–51.0)
Hemoglobin: 10.3 g/dL — ABNORMAL LOW (ref 13.0–17.7)
MCH: 31.8 pg (ref 26.6–33.0)
MCHC: 32.8 g/dL (ref 31.5–35.7)
MCV: 97 fL (ref 79–97)
Platelets: 265 10*3/uL (ref 150–450)
RBC: 3.24 x10E6/uL — ABNORMAL LOW (ref 4.14–5.80)
RDW: 15 % (ref 11.6–15.4)
WBC: 2.7 10*3/uL — ABNORMAL LOW (ref 3.4–10.8)

## 2022-06-01 LAB — TSH: TSH: 1.77 u[IU]/mL (ref 0.450–4.500)

## 2022-06-02 DIAGNOSIS — E1165 Type 2 diabetes mellitus with hyperglycemia: Secondary | ICD-10-CM | POA: Diagnosis not present

## 2022-06-02 DIAGNOSIS — I4891 Unspecified atrial fibrillation: Secondary | ICD-10-CM | POA: Diagnosis not present

## 2022-06-02 DIAGNOSIS — I509 Heart failure, unspecified: Secondary | ICD-10-CM | POA: Diagnosis not present

## 2022-06-02 DIAGNOSIS — K7460 Unspecified cirrhosis of liver: Secondary | ICD-10-CM | POA: Diagnosis not present

## 2022-06-07 DIAGNOSIS — I482 Chronic atrial fibrillation, unspecified: Secondary | ICD-10-CM | POA: Diagnosis not present

## 2022-06-07 DIAGNOSIS — I5030 Unspecified diastolic (congestive) heart failure: Secondary | ICD-10-CM | POA: Diagnosis not present

## 2022-06-07 DIAGNOSIS — F03A18 Unspecified dementia, mild, with other behavioral disturbance: Secondary | ICD-10-CM | POA: Diagnosis not present

## 2022-06-07 DIAGNOSIS — E119 Type 2 diabetes mellitus without complications: Secondary | ICD-10-CM | POA: Diagnosis not present

## 2022-06-07 DIAGNOSIS — F411 Generalized anxiety disorder: Secondary | ICD-10-CM | POA: Diagnosis not present

## 2022-06-20 DIAGNOSIS — E119 Type 2 diabetes mellitus without complications: Secondary | ICD-10-CM | POA: Diagnosis not present

## 2022-06-30 DIAGNOSIS — I509 Heart failure, unspecified: Secondary | ICD-10-CM | POA: Diagnosis not present

## 2022-06-30 DIAGNOSIS — I4891 Unspecified atrial fibrillation: Secondary | ICD-10-CM | POA: Diagnosis not present

## 2022-06-30 DIAGNOSIS — K7460 Unspecified cirrhosis of liver: Secondary | ICD-10-CM | POA: Diagnosis not present

## 2022-06-30 DIAGNOSIS — E1165 Type 2 diabetes mellitus with hyperglycemia: Secondary | ICD-10-CM | POA: Diagnosis not present

## 2022-07-11 DIAGNOSIS — I5030 Unspecified diastolic (congestive) heart failure: Secondary | ICD-10-CM | POA: Diagnosis not present

## 2022-07-11 DIAGNOSIS — E119 Type 2 diabetes mellitus without complications: Secondary | ICD-10-CM | POA: Diagnosis not present

## 2022-07-11 DIAGNOSIS — I482 Chronic atrial fibrillation, unspecified: Secondary | ICD-10-CM | POA: Diagnosis not present

## 2022-07-18 ENCOUNTER — Telehealth: Payer: Self-pay | Admitting: Neurology

## 2022-07-18 NOTE — Telephone Encounter (Signed)
Sent mychart msg informing pt of appt change due to provider schedule change 

## 2022-07-25 DIAGNOSIS — N451 Epididymitis: Secondary | ICD-10-CM | POA: Diagnosis not present

## 2022-07-25 DIAGNOSIS — I5030 Unspecified diastolic (congestive) heart failure: Secondary | ICD-10-CM | POA: Diagnosis not present

## 2022-07-25 DIAGNOSIS — I482 Chronic atrial fibrillation, unspecified: Secondary | ICD-10-CM | POA: Diagnosis not present

## 2022-07-25 DIAGNOSIS — E119 Type 2 diabetes mellitus without complications: Secondary | ICD-10-CM | POA: Diagnosis not present

## 2022-07-25 DIAGNOSIS — F411 Generalized anxiety disorder: Secondary | ICD-10-CM | POA: Diagnosis not present

## 2022-07-28 ENCOUNTER — Emergency Department (HOSPITAL_COMMUNITY): Payer: Medicare Other

## 2022-07-28 ENCOUNTER — Emergency Department (HOSPITAL_COMMUNITY)
Admission: EM | Admit: 2022-07-28 | Discharge: 2022-07-29 | Disposition: A | Discharge status: Home / Self Care | Payer: Medicare Other | Attending: Emergency Medicine | Admitting: Emergency Medicine

## 2022-07-28 ENCOUNTER — Encounter (HOSPITAL_COMMUNITY): Payer: Self-pay

## 2022-07-28 DIAGNOSIS — R0602 Shortness of breath: Secondary | ICD-10-CM | POA: Diagnosis not present

## 2022-07-28 DIAGNOSIS — R17 Unspecified jaundice: Secondary | ICD-10-CM | POA: Diagnosis not present

## 2022-07-28 DIAGNOSIS — D72819 Decreased white blood cell count, unspecified: Secondary | ICD-10-CM | POA: Insufficient documentation

## 2022-07-28 DIAGNOSIS — R739 Hyperglycemia, unspecified: Secondary | ICD-10-CM | POA: Diagnosis not present

## 2022-07-28 DIAGNOSIS — I509 Heart failure, unspecified: Secondary | ICD-10-CM | POA: Insufficient documentation

## 2022-07-28 DIAGNOSIS — Z79899 Other long term (current) drug therapy: Secondary | ICD-10-CM | POA: Diagnosis not present

## 2022-07-28 DIAGNOSIS — F419 Anxiety disorder, unspecified: Secondary | ICD-10-CM | POA: Diagnosis not present

## 2022-07-28 DIAGNOSIS — R410 Disorientation, unspecified: Secondary | ICD-10-CM | POA: Diagnosis not present

## 2022-07-28 DIAGNOSIS — I2510 Atherosclerotic heart disease of native coronary artery without angina pectoris: Secondary | ICD-10-CM | POA: Diagnosis not present

## 2022-07-28 DIAGNOSIS — Z7984 Long term (current) use of oral hypoglycemic drugs: Secondary | ICD-10-CM | POA: Insufficient documentation

## 2022-07-28 DIAGNOSIS — I110 Hypertensive heart disease with heart failure: Secondary | ICD-10-CM | POA: Insufficient documentation

## 2022-07-28 DIAGNOSIS — Z7901 Long term (current) use of anticoagulants: Secondary | ICD-10-CM | POA: Insufficient documentation

## 2022-07-28 DIAGNOSIS — R0689 Other abnormalities of breathing: Secondary | ICD-10-CM | POA: Diagnosis not present

## 2022-07-28 DIAGNOSIS — E119 Type 2 diabetes mellitus without complications: Secondary | ICD-10-CM | POA: Diagnosis not present

## 2022-07-28 DIAGNOSIS — F0390 Unspecified dementia without behavioral disturbance: Secondary | ICD-10-CM | POA: Diagnosis not present

## 2022-07-28 DIAGNOSIS — E876 Hypokalemia: Secondary | ICD-10-CM | POA: Insufficient documentation

## 2022-07-28 DIAGNOSIS — I4891 Unspecified atrial fibrillation: Secondary | ICD-10-CM | POA: Diagnosis not present

## 2022-07-28 DIAGNOSIS — R4182 Altered mental status, unspecified: Secondary | ICD-10-CM | POA: Insufficient documentation

## 2022-07-28 DIAGNOSIS — Z794 Long term (current) use of insulin: Secondary | ICD-10-CM | POA: Insufficient documentation

## 2022-07-28 DIAGNOSIS — Z043 Encounter for examination and observation following other accident: Secondary | ICD-10-CM | POA: Diagnosis not present

## 2022-07-28 DIAGNOSIS — I491 Atrial premature depolarization: Secondary | ICD-10-CM | POA: Diagnosis not present

## 2022-07-28 DIAGNOSIS — I10 Essential (primary) hypertension: Secondary | ICD-10-CM | POA: Diagnosis not present

## 2022-07-28 LAB — CBC WITH DIFFERENTIAL/PLATELET
Abs Immature Granulocytes: 0.04 10*3/uL (ref 0.00–0.07)
Basophils Absolute: 0 10*3/uL (ref 0.0–0.1)
Basophils Relative: 0 %
Eosinophils Absolute: 0 10*3/uL (ref 0.0–0.5)
Eosinophils Relative: 1 %
HCT: 30.2 % — ABNORMAL LOW (ref 39.0–52.0)
Hemoglobin: 10 g/dL — ABNORMAL LOW (ref 13.0–17.0)
Immature Granulocytes: 1 %
Lymphocytes Relative: 24 %
Lymphs Abs: 0.8 10*3/uL (ref 0.7–4.0)
MCH: 32.3 pg (ref 26.0–34.0)
MCHC: 33.1 g/dL (ref 30.0–36.0)
MCV: 97.4 fL (ref 80.0–100.0)
Monocytes Absolute: 0.1 10*3/uL (ref 0.1–1.0)
Monocytes Relative: 4 %
Neutro Abs: 2.4 10*3/uL (ref 1.7–7.7)
Neutrophils Relative %: 70 %
Platelets: 338 10*3/uL (ref 150–400)
RBC: 3.1 MIL/uL — ABNORMAL LOW (ref 4.22–5.81)
RDW: 16.4 % — ABNORMAL HIGH (ref 11.5–15.5)
WBC: 3.4 10*3/uL — ABNORMAL LOW (ref 4.0–10.5)
nRBC: 0 % (ref 0.0–0.2)

## 2022-07-28 LAB — URINALYSIS, W/ REFLEX TO CULTURE (INFECTION SUSPECTED)
Bacteria, UA: NONE SEEN
Bilirubin Urine: NEGATIVE
Glucose, UA: NEGATIVE mg/dL
Hgb urine dipstick: NEGATIVE
Ketones, ur: NEGATIVE mg/dL
Leukocytes,Ua: NEGATIVE
Nitrite: NEGATIVE
Protein, ur: 30 mg/dL — AB
Specific Gravity, Urine: 1.01 (ref 1.005–1.030)
pH: 5 (ref 5.0–8.0)

## 2022-07-28 LAB — CBG MONITORING, ED: Glucose-Capillary: 278 mg/dL — ABNORMAL HIGH (ref 70–99)

## 2022-07-28 LAB — COMPREHENSIVE METABOLIC PANEL
ALT: 14 U/L (ref 0–44)
AST: 22 U/L (ref 15–41)
Albumin: 3.4 g/dL — ABNORMAL LOW (ref 3.5–5.0)
Alkaline Phosphatase: 75 U/L (ref 38–126)
Anion gap: 14 (ref 5–15)
BUN: 23 mg/dL (ref 8–23)
CO2: 18 mmol/L — ABNORMAL LOW (ref 22–32)
Calcium: 8.5 mg/dL — ABNORMAL LOW (ref 8.9–10.3)
Chloride: 100 mmol/L (ref 98–111)
Creatinine, Ser: 1.24 mg/dL (ref 0.61–1.24)
GFR, Estimated: >60 mL/min (ref >60)
Glucose, Bld: 260 mg/dL — ABNORMAL HIGH (ref 70–99)
Potassium: 3 mmol/L — ABNORMAL LOW (ref 3.5–5.1)
Sodium: 132 mmol/L — ABNORMAL LOW (ref 135–145)
Total Bilirubin: 0.8 mg/dL (ref 0.3–1.2)
Total Protein: 7.7 g/dL (ref 6.5–8.1)

## 2022-07-28 MED ORDER — NYSTATIN 100000 UNIT/GM EX POWD
Freq: Once | CUTANEOUS | Status: AC
  Filled 2022-07-28: qty 15

## 2022-07-28 MED ORDER — POTASSIUM CHLORIDE CRYS ER 20 MEQ PO TBCR
40.0000 meq | EXTENDED_RELEASE_TABLET | Freq: Once | ORAL | Status: AC
  Administered 2022-07-29: 40 meq via ORAL
  Filled 2022-07-28: qty 2

## 2022-07-28 MED ORDER — LORAZEPAM 1 MG PO TABS
1.0000 mg | ORAL_TABLET | Freq: Once | ORAL | Status: AC
  Administered 2022-07-28: 1 mg via ORAL
  Filled 2022-07-28: qty 1

## 2022-07-28 NOTE — ED Provider Notes (Signed)
[place] EMERGENCY DEPARTMENT AT [facility] Provider Note   CSN: [id number] Arrival date & time: 07/28/22  2107     History {Add pertinent medical, surgical, social history, OB history to HPI:1} Chief Complaint  Patient presents with   Altered Mental Status    Mark Cabrera is a 72 y.o. male.  Pt is a 72y/o male with hx of CHF, CAD, hypertension, hyperlipidemia, diabetes, memory deficit with occasional behavioral disturbance who is on chronic oxygen therapy coming from [facility] nursing facility today for complaint of altered mental status.  Attempted to call patient's daughter but she was not available by phone.  Spoke with patient's nurse Burna Mortimer who reports that today patient's behavior started to escalate and he started to become very anxious.  He started to attempt to throw himself on the ground and would not wear his oxygen.  He started saying he was unable to breathe.  He did receive his normal meds today but she states that he will intermittently do this and he does not have any medication to give when he gets in the state.  She is asked the nurse practitioner but they have not prescribed anything.  She is unaware of him having a new cough, fever, any urinary complaints.  They have been placing powder on his scrotum but it has not been looking any worse.  As far she knows he has been eating and drinking regularly.  Patient is just repeatedly saying that his scrotum hurts and he would like something put on it.  He denies any abdominal pain or shortness of breath at this time.  The history is provided by the patient, the EMS personnel and the nursing home.  Altered Mental Status      Home Medications Prior to Admission medications   Medication Sig Start Date End Date Taking? Authorizing Provider  Accu-Chek Softclix Lancets lancets Use as instructed 12/03/20   Dolan Amen, MD  acetaminophen (TYLENOL) 325 MG tablet Take 2 tablets (650 mg total) by mouth every 4  (four) hours as needed for headache or mild pain. 04/28/16   Leone Brand, NP  Alirocumab (PRALUENT) 75 MG/ML SOAJ Inject 1 Pen into the skin every 14 (fourteen) days. 03/21/22   Kathleene Hazel, MD  apixaban (ELIQUIS) 5 MG TABS tablet Take 1 tablet by mouth twice daily Patient taking differently: Take 5 mg by mouth 2 (two) times daily. 12/08/20   Kathleene Hazel, MD  benzocaine (ORAJEL) 10 % mucosal gel Use as directed 1 Application in the mouth or throat 4 (four) times daily as needed for mouth pain.    [provider]  donepezil (ARICEPT) 5 MG tablet Take 1 tablet (5 mg total) by mouth at bedtime. 05/29/22   Windell Norfolk, MD  escitalopram (LEXAPRO) 10 MG tablet Take 1 tablet (10 mg total) by mouth daily. 12/02/20   Elige Radon, MD  furosemide (LASIX) 40 MG tablet Take 40 mg by mouth 2 (two) times daily.    [provider]  glucose blood (ACCU-CHEK GUIDE) test strip Use as instructed 12/03/20   Dolan Amen, MD  insulin aspart (NOVOLOG FLEXPEN) 100 UNIT/ML FlexPen INJECT 5 UNITS UNDER THE SKIN THREE TIMES DAILY WITH MEALS 03/03/22   Willette Cluster, MD  insulin glargine-yfgn (SEMGLEE) 100 UNIT/ML injection Inject 0.26 mLs (26 Units total) into the skin at bedtime. 03/03/22   Willette Cluster, MD  Insulin Pen Needle 31G X 5 MM MISC Use one pen needle three times daily. Dx E11.49 09/14/21  Evlyn Kanner, MD  ipratropium-albuterol (DUONEB) 0.5-2.5 (3) MG/3ML SOLN Inhale 3 mLs into the lungs every 6 (six) hours as needed (for shortness of breath).    [provider]  metFORMIN (GLUCOPHAGE) 500 MG tablet Take 1 tablet (500 mg total) by mouth 2 (two) times daily with a meal. 12/30/20 03/19/23  Steffanie Rainwater, MD  metoprolol succinate (TOPROL-XL) 25 MG 24 hr tablet Take 0.5 tablets (12.5 mg total) by mouth daily. 01/26/22 04/26/22  Adron Bene, MD  polyethylene glycol (MIRALAX / GLYCOLAX) 17 g packet Take 17 g by mouth daily. 09/29/21   Morene Crocker, MD  QUEtiapine (SEROQUEL XR) 50 MG TB24 24 hr tablet Take 1 tablet (50 mg total) by mouth at bedtime for 14 days. 03/18/22 04/01/22  Eligha Bridegroom, NP  spironolactone (ALDACTONE) 25 MG tablet Take 0.5 tablets (12.5 mg total) by mouth daily. 03/04/22   Willette Cluster, MD  valACYclovir (VALTREX) 1000 MG tablet Take 1,000 mg by mouth 2 (two) times daily.    [provider]  vitamin B-12 (CYANOCOBALAMIN) 100 MCG tablet Take 100 mcg by mouth daily.    [provider]      Allergies    Morpholine salicylate, Penicillins, Morphine and codeine, Sglt2 inhibitors, and Statins    Review of Systems   Review of Systems  Physical Exam Updated Vital Signs BP 129/82   Pulse (!) 104   Temp (!) 97.4 F (36.3 C) (Oral)   Resp 20   Ht 5\' 8"  (1.727 m)   Wt 85.2 kg   SpO2 99%   BMI 28.56 kg/m  Physical Exam Vitals and nursing note reviewed. Exam conducted with a chaperone present.  Constitutional:      General: He is not in acute distress.    Appearance: He is well-developed.  HENT:     Head: Normocephalic and atraumatic.  Eyes:     Conjunctiva/sclera: Conjunctivae normal.     Pupils: Pupils are equal, round, and reactive to light.  Cardiovascular:     Rate and Rhythm: Normal rate and regular rhythm.     Heart sounds: No murmur heard. Pulmonary:     Effort: Pulmonary effort is normal. No respiratory distress.     Breath sounds: Normal breath sounds. No wheezing or rales.  Abdominal:     General: There is no distension.     Palpations: Abdomen is soft.     Tenderness: There is no abdominal tenderness. There is no right CVA tenderness, left CVA tenderness, guarding or rebound.  Genitourinary:    Penis: Uncircumcised.      Comments: Patient's scrotum is tender to the touch with nystatin cream present and some mild erythema and skin breakdown.  No induration, fluctuance, drainage or abscesses present.  Testicles are normal. Musculoskeletal:        General: No  tenderness. Normal range of motion.     Cervical back: Normal range of motion and neck supple.     Right lower leg: No edema.     Left lower leg: No edema.  Skin:    General: Skin is warm and dry.     Findings: No erythema or rash.  Neurological:     Mental Status: He is alert.     Sensory: No sensory deficit.     Motor: No weakness.     Comments: Patient is able to answer questions, mildly confused, oriented to person  Psychiatric:     Comments: Cooperative at this time     ED Results /  Procedures / Treatments   Labs (all labs ordered are listed, but only abnormal results are displayed) Labs Reviewed  CBC WITH DIFFERENTIAL/PLATELET - Abnormal; Notable for the following components:      Result Value   WBC 3.4 (*)    RBC 3.10 (*)    Hemoglobin 10.0 (*)    HCT 30.2 (*)    RDW 16.4 (*)    All other components within normal limits  URINALYSIS, W/ REFLEX TO CULTURE (INFECTION SUSPECTED) - Abnormal; Notable for the following components:   Protein, ur 30 (*)    All other components within normal limits  COMPREHENSIVE METABOLIC PANEL - Abnormal; Notable for the following components:   Sodium 132 (*)    Potassium 3.0 (*)    CO2 18 (*)    Glucose, Bld 260 (*)    Calcium 8.5 (*)    Albumin 3.4 (*)    All other components within normal limits  CBG MONITORING, ED - Abnormal; Notable for the following components:   Glucose-Capillary 278 (*)    All other components within normal limits    EKG None  Radiology No results found.  Procedures Procedures  {Document cardiac monitor, telemetry assessment procedure when appropriate:1}  Medications Ordered in ED Medications  potassium chloride SA (KLOR-CON M) CR tablet 40 mEq (has no administration in time range)  nystatin (MYCOSTATIN/NYSTOP) topical powder ( Topical Given 07/28/22 2255)  LORazepam (ATIVAN) tablet 1 mg (1 mg Oral Given 07/28/22 2323)    ED Course/ Medical Decision Making/ A&P   {   Click here for ABCD2, HEART  and other calculatorsREFRESH Note before signing :1}                          Medical Decision Making Amount and/or Complexity of Data Reviewed Labs: ordered. Radiology: ordered.  Risk Prescription drug management.   Pt with multiple medical problems and comorbidities and presenting today with a complaint that caries a high risk for morbidity and mortality.  Here today with altered mental status which seems to be more behavioral from his underlying dementia.  Nurse reports he was trying to throw himself on the ground and then reporting that he could not breathe but pulse ox even off of his oxygen looked normal.  He has not had any infectious symptoms that she is aware of.  On exam here patient is mildly anxious but is cooperative.  He continually complains about pain over his scrotum but there is no evidence of Fournier's or abscess.  Does have some skin breakdown consistent with yeast.  But after speaking with the daughter it may be skin breakdown because patient has been stooling on himself and refusing to bathe with.  Will add a barrier cream.  Will also confirm that patient is not having any CHF today as he has a significant history of CHF in the past.  Independently interpreted patient's labs today and CBC is at baseline with chronic leukopenia, stable hemoglobin of 10.  CMP with hypokalemia today at 3.0 most likely from his chronic cardiac medications as well as hyperglycemia with a blood sugar of 260 which is similar to prior numbers.  UA within normal limits today.  Nystatin cream was placed but patient was given oral Ativan to help with his anxiety.  Patient sats are 99% and he is in no respiratory distress at this time.  He does not have evidence of fluid overload today. I have independently visualized and interpreted pt's images  today.  Chest x-ray today with cardiomegaly and some mild vascular congestion today.  Head CT pending     {Document critical care time when  appropriate:1} {Document review of labs and clinical decision tools ie heart score, Chads2Vasc2 etc:1}  {Document your independent review of radiology images, and any outside records:1} {Document your discussion with family members, caretakers, and with consultants:1} {Document social determinants of health affecting pt's care:1} {Document your decision making why or why not admission, treatments were needed:1} Final Clinical Impression(s) / ED Diagnoses Final diagnoses:  None    Rx / DC Orders ED Discharge Orders     None

## 2022-07-28 NOTE — ED Triage Notes (Signed)
Pt presents to ED for altered mental status

## 2022-07-29 ENCOUNTER — Emergency Department (HOSPITAL_COMMUNITY): Payer: Medicare Other

## 2022-07-29 DIAGNOSIS — Z7401 Bed confinement status: Secondary | ICD-10-CM | POA: Diagnosis not present

## 2022-07-29 DIAGNOSIS — Z043 Encounter for examination and observation following other accident: Secondary | ICD-10-CM | POA: Diagnosis not present

## 2022-07-29 DIAGNOSIS — R4182 Altered mental status, unspecified: Secondary | ICD-10-CM | POA: Diagnosis not present

## 2022-07-29 DIAGNOSIS — R404 Transient alteration of awareness: Secondary | ICD-10-CM | POA: Diagnosis not present

## 2022-07-29 LAB — BRAIN NATRIURETIC PEPTIDE: B Natriuretic Peptide: 2006.3 pg/mL — ABNORMAL HIGH (ref 0.0–100.0)

## 2022-07-29 LAB — TROPONIN I (HIGH SENSITIVITY): Troponin I (High Sensitivity): 37 ng/L — ABNORMAL HIGH (ref <18)

## 2022-07-29 MED ORDER — FUROSEMIDE 10 MG/ML IJ SOLN
40.0000 mg | Freq: Once | INTRAMUSCULAR | Status: AC
  Administered 2022-07-29: 40 mg via INTRAVENOUS
  Filled 2022-07-29: qty 4

## 2022-07-29 NOTE — ED Provider Notes (Signed)
  Provider Note MRN:  [id number]  Arrival date & time: 07/29/22    ED Course and Medical Decision Making  Assumed care from Dr. Anitra Lauth at shift change.  Patient coming from [facility] with some agitation, initially complaining of shortness of breath, here complaining of some testicular discomfort.  Overall favoring more of a behavioral presentation.  Does not seem significantly fluid overloaded, overall reassuring workup.  Waiting completion of workup with CT head and will reassess.  Anticipating discharge.  5 AM update: Patient's BNP did return elevated.  Patient was given dose of Lasix with excellent urine output.  Continues to have normal vital signs with no hypoxia, no increased work of breathing.  Seems appropriate for discharge.  Patient did climb out of bed and stumbled to the ground.  Has a small skin tear to the left elbow, was complaining of some mild left hip pain.  Pelvis x-ray is normal, did not hit his head, no signs of head trauma.  Attempted to reach patient's daughter, no answer, will send back to facility via Theron Arista.  Procedures  Final Clinical Impressions(s) / ED Diagnoses     ICD-10-CM   1. Acute on chronic congestive heart failure, unspecified heart failure type (HCC)  I50.9       ED Discharge Orders     None         Discharge Instructions      You were evaluated in the Emergency Department and after careful evaluation, we did not find any emergent condition requiring admission or further testing in the hospital.  Your exam/testing today was overall reassuring.  Your symptoms may have been due to a mild exacerbation of your heart failure.  We gave you an extra dose of Lasix here in the emergency department.  Recommend talking to your regular doctors, you may want to increase her Lasix at home for the next few days.  Please return to the Emergency Department if you experience any worsening of your condition.  Thank you for allowing [place] to be a part of your  care.       Elmer Sow. Pilar Plate, MD [facility] Emergency Medicine [facility] [email or web address]    Sabas Sous, MD 07/29/22 [phone]

## 2022-07-29 NOTE — Discharge Instructions (Signed)
You were evaluated in the Emergency Department and after careful evaluation, we did not find any emergent condition requiring admission or further testing in the hospital.  Your exam/testing today was overall reassuring.  Your symptoms may have been due to a mild exacerbation of your heart failure.  We gave you an extra dose of Lasix here in the emergency department.  Recommend talking to your regular doctors, you may want to increase her Lasix at home for the next few days.  Please return to the Emergency Department if you experience any worsening of your condition.  Thank you for allowing [place] to be a part of your care.

## 2022-07-29 NOTE — ED Notes (Signed)
Pt attempted to reach for his urinal and climbed over the bed rail. Pt fell to the floor over the rail. Pt did not attempt to call out for help before reaching for the urinal. Staff immediately heard the fall and responded. Pt was found on the floor with small skin tear to left elbow. No other noted injuries or pt complaints identified. Pt was assisted with use of urinal. Lines were changed and bed alarm placed before placing patient back on the monitor and in bed with new gown. Pt was given call light and instructed to use it.

## 2022-07-31 ENCOUNTER — Telehealth: Payer: Self-pay | Admitting: Student

## 2022-07-31 DIAGNOSIS — F03A18 Unspecified dementia, mild, with other behavioral disturbance: Secondary | ICD-10-CM | POA: Diagnosis not present

## 2022-07-31 DIAGNOSIS — I5030 Unspecified diastolic (congestive) heart failure: Secondary | ICD-10-CM | POA: Diagnosis not present

## 2022-07-31 DIAGNOSIS — E876 Hypokalemia: Secondary | ICD-10-CM | POA: Diagnosis not present

## 2022-07-31 DIAGNOSIS — F411 Generalized anxiety disorder: Secondary | ICD-10-CM | POA: Diagnosis not present

## 2022-07-31 DIAGNOSIS — B3742 Candidal balanitis: Secondary | ICD-10-CM | POA: Diagnosis not present

## 2022-07-31 DIAGNOSIS — I482 Chronic atrial fibrillation, unspecified: Secondary | ICD-10-CM | POA: Diagnosis not present

## 2022-07-31 NOTE — Telephone Encounter (Signed)
Pt's daughter requesting a call back about her father's most recent Lab work done in the ED on 07/28/2022.  Pt's daughter also states her father has called the Ambulance again on yesterday and they did not take him.  Pt's daughter is mostly concerned about Labs results for the BNP that was done.

## 2022-07-31 NOTE — Telephone Encounter (Signed)
Spoke to the daughter regarding the BNP and let her know that this could be indicative of the acute exacerbation he was having and that they increased his lasix and that should be helping. She states that he is having shortness of breath at the facility in which patient did call 911 but they did not take him to the ED as they did not think he needed emergency services. They are planning to have the provider see the patient in the facility as patient has been having behavioral issues. I do state to the daughter that she can always make an appointment with our clinic and see if the home can help provide transport which she states she might do as she is not happy with the care that he is getting at the home.

## 2022-08-01 ENCOUNTER — Other Ambulatory Visit: Payer: Self-pay

## 2022-08-01 ENCOUNTER — Telehealth: Payer: Self-pay | Admitting: Cardiovascular Disease

## 2022-08-01 ENCOUNTER — Encounter (HOSPITAL_COMMUNITY): Payer: Self-pay

## 2022-08-01 ENCOUNTER — Emergency Department (HOSPITAL_COMMUNITY): Payer: Medicare Other

## 2022-08-01 ENCOUNTER — Emergency Department (HOSPITAL_COMMUNITY)
Admission: EM | Admit: 2022-08-01 | Discharge: 2022-08-01 | Disposition: A | Discharge status: Skilled Nursing Facility | Payer: Medicare Other | Source: Home / Self Care | Attending: Emergency Medicine | Admitting: Emergency Medicine

## 2022-08-01 DIAGNOSIS — E1165 Type 2 diabetes mellitus with hyperglycemia: Secondary | ICD-10-CM | POA: Diagnosis present

## 2022-08-01 DIAGNOSIS — I110 Hypertensive heart disease with heart failure: Secondary | ICD-10-CM | POA: Diagnosis not present

## 2022-08-01 DIAGNOSIS — F29 Unspecified psychosis not due to a substance or known physiological condition: Secondary | ICD-10-CM | POA: Diagnosis not present

## 2022-08-01 DIAGNOSIS — E876 Hypokalemia: Secondary | ICD-10-CM | POA: Diagnosis not present

## 2022-08-01 DIAGNOSIS — R778 Other specified abnormalities of plasma proteins: Secondary | ICD-10-CM | POA: Diagnosis not present

## 2022-08-01 DIAGNOSIS — R410 Disorientation, unspecified: Secondary | ICD-10-CM | POA: Diagnosis not present

## 2022-08-01 DIAGNOSIS — Z7984 Long term (current) use of oral hypoglycemic drugs: Secondary | ICD-10-CM | POA: Insufficient documentation

## 2022-08-01 DIAGNOSIS — R531 Weakness: Secondary | ICD-10-CM | POA: Diagnosis not present

## 2022-08-01 DIAGNOSIS — M6281 Muscle weakness (generalized): Secondary | ICD-10-CM | POA: Diagnosis not present

## 2022-08-01 DIAGNOSIS — B379 Candidiasis, unspecified: Secondary | ICD-10-CM | POA: Diagnosis present

## 2022-08-01 DIAGNOSIS — R0602 Shortness of breath: Secondary | ICD-10-CM | POA: Diagnosis not present

## 2022-08-01 DIAGNOSIS — Z66 Do not resuscitate: Secondary | ICD-10-CM | POA: Diagnosis present

## 2022-08-01 DIAGNOSIS — Z794 Long term (current) use of insulin: Secondary | ICD-10-CM | POA: Insufficient documentation

## 2022-08-01 DIAGNOSIS — E785 Hyperlipidemia, unspecified: Secondary | ICD-10-CM | POA: Diagnosis present

## 2022-08-01 DIAGNOSIS — I4819 Other persistent atrial fibrillation: Secondary | ICD-10-CM | POA: Diagnosis present

## 2022-08-01 DIAGNOSIS — Z7401 Bed confinement status: Secondary | ICD-10-CM | POA: Diagnosis not present

## 2022-08-01 DIAGNOSIS — I472 Ventricular tachycardia, unspecified: Secondary | ICD-10-CM | POA: Diagnosis present

## 2022-08-01 DIAGNOSIS — E871 Hypo-osmolality and hyponatremia: Secondary | ICD-10-CM | POA: Diagnosis present

## 2022-08-01 DIAGNOSIS — I2489 Other forms of acute ischemic heart disease: Secondary | ICD-10-CM | POA: Diagnosis present

## 2022-08-01 DIAGNOSIS — F064 Anxiety disorder due to known physiological condition: Secondary | ICD-10-CM | POA: Diagnosis not present

## 2022-08-01 DIAGNOSIS — R262 Difficulty in walking, not elsewhere classified: Secondary | ICD-10-CM | POA: Diagnosis not present

## 2022-08-01 DIAGNOSIS — I517 Cardiomegaly: Secondary | ICD-10-CM | POA: Diagnosis not present

## 2022-08-01 DIAGNOSIS — R456 Violent behavior: Secondary | ICD-10-CM | POA: Diagnosis not present

## 2022-08-01 DIAGNOSIS — R739 Hyperglycemia, unspecified: Secondary | ICD-10-CM | POA: Diagnosis not present

## 2022-08-01 DIAGNOSIS — Z79899 Other long term (current) drug therapy: Secondary | ICD-10-CM | POA: Diagnosis not present

## 2022-08-01 DIAGNOSIS — Z952 Presence of prosthetic heart valve: Secondary | ICD-10-CM | POA: Diagnosis not present

## 2022-08-01 DIAGNOSIS — F331 Major depressive disorder, recurrent, moderate: Secondary | ICD-10-CM | POA: Diagnosis not present

## 2022-08-01 DIAGNOSIS — I5033 Acute on chronic diastolic (congestive) heart failure: Secondary | ICD-10-CM | POA: Diagnosis not present

## 2022-08-01 DIAGNOSIS — I509 Heart failure, unspecified: Secondary | ICD-10-CM | POA: Diagnosis not present

## 2022-08-01 DIAGNOSIS — I482 Chronic atrial fibrillation, unspecified: Secondary | ICD-10-CM | POA: Insufficient documentation

## 2022-08-01 DIAGNOSIS — F0284 Dementia in other diseases classified elsewhere, unspecified severity, with anxiety: Secondary | ICD-10-CM | POA: Diagnosis present

## 2022-08-01 DIAGNOSIS — D649 Anemia, unspecified: Secondary | ICD-10-CM | POA: Diagnosis not present

## 2022-08-01 DIAGNOSIS — I5043 Acute on chronic combined systolic (congestive) and diastolic (congestive) heart failure: Secondary | ICD-10-CM | POA: Diagnosis not present

## 2022-08-01 DIAGNOSIS — Z7901 Long term (current) use of anticoagulants: Secondary | ICD-10-CM | POA: Diagnosis not present

## 2022-08-01 DIAGNOSIS — F0393 Unspecified dementia, unspecified severity, with mood disturbance: Secondary | ICD-10-CM | POA: Diagnosis not present

## 2022-08-01 DIAGNOSIS — I4891 Unspecified atrial fibrillation: Secondary | ICD-10-CM | POA: Diagnosis not present

## 2022-08-01 DIAGNOSIS — I700 Atherosclerosis of aorta: Secondary | ICD-10-CM | POA: Diagnosis not present

## 2022-08-01 DIAGNOSIS — Z8249 Family history of ischemic heart disease and other diseases of the circulatory system: Secondary | ICD-10-CM | POA: Diagnosis not present

## 2022-08-01 DIAGNOSIS — F03918 Unspecified dementia, unspecified severity, with other behavioral disturbance: Secondary | ICD-10-CM | POA: Insufficient documentation

## 2022-08-01 DIAGNOSIS — K7460 Unspecified cirrhosis of liver: Secondary | ICD-10-CM | POA: Diagnosis present

## 2022-08-01 DIAGNOSIS — R296 Repeated falls: Secondary | ICD-10-CM | POA: Diagnosis not present

## 2022-08-01 DIAGNOSIS — I480 Paroxysmal atrial fibrillation: Secondary | ICD-10-CM | POA: Diagnosis not present

## 2022-08-01 DIAGNOSIS — F02818 Dementia in other diseases classified elsewhere, unspecified severity, with other behavioral disturbance: Secondary | ICD-10-CM

## 2022-08-01 DIAGNOSIS — I2510 Atherosclerotic heart disease of native coronary artery without angina pectoris: Secondary | ICD-10-CM | POA: Diagnosis present

## 2022-08-01 DIAGNOSIS — W19XXXA Unspecified fall, initial encounter: Secondary | ICD-10-CM | POA: Diagnosis not present

## 2022-08-01 DIAGNOSIS — D72819 Decreased white blood cell count, unspecified: Secondary | ICD-10-CM | POA: Diagnosis present

## 2022-08-01 DIAGNOSIS — Z743 Need for continuous supervision: Secondary | ICD-10-CM | POA: Diagnosis not present

## 2022-08-01 DIAGNOSIS — Z22322 Carrier or suspected carrier of Methicillin resistant Staphylococcus aureus: Secondary | ICD-10-CM | POA: Diagnosis not present

## 2022-08-01 DIAGNOSIS — F0390 Unspecified dementia without behavioral disturbance: Secondary | ICD-10-CM

## 2022-08-01 DIAGNOSIS — E440 Moderate protein-calorie malnutrition: Secondary | ICD-10-CM | POA: Diagnosis present

## 2022-08-01 DIAGNOSIS — J90 Pleural effusion, not elsewhere classified: Secondary | ICD-10-CM | POA: Diagnosis not present

## 2022-08-01 LAB — COMPREHENSIVE METABOLIC PANEL
ALT: 14 U/L (ref 0–44)
AST: 22 U/L (ref 15–41)
Albumin: 3.3 g/dL — ABNORMAL LOW (ref 3.5–5.0)
Alkaline Phosphatase: 77 U/L (ref 38–126)
Anion gap: 13 (ref 5–15)
BUN: 28 mg/dL — ABNORMAL HIGH (ref 8–23)
CO2: 18 mmol/L — ABNORMAL LOW (ref 22–32)
Calcium: 8.3 mg/dL — ABNORMAL LOW (ref 8.9–10.3)
Chloride: 104 mmol/L (ref 98–111)
Creatinine, Ser: 1.09 mg/dL (ref 0.61–1.24)
GFR, Estimated: >60 mL/min (ref >60)
Glucose, Bld: 188 mg/dL — ABNORMAL HIGH (ref 70–99)
Potassium: 3.1 mmol/L — ABNORMAL LOW (ref 3.5–5.1)
Sodium: 135 mmol/L (ref 135–145)
Total Bilirubin: 0.9 mg/dL (ref 0.3–1.2)
Total Protein: 7.3 g/dL (ref 6.5–8.1)

## 2022-08-01 LAB — BRAIN NATRIURETIC PEPTIDE: B Natriuretic Peptide: 1518.8 pg/mL — ABNORMAL HIGH (ref 0.0–100.0)

## 2022-08-01 LAB — CBC
HCT: 30.7 % — ABNORMAL LOW (ref 39.0–52.0)
Hemoglobin: 9.9 g/dL — ABNORMAL LOW (ref 13.0–17.0)
MCH: 32.2 pg (ref 26.0–34.0)
MCHC: 32.2 g/dL (ref 30.0–36.0)
MCV: 100 fL (ref 80.0–100.0)
Platelets: 225 10*3/uL (ref 150–400)
RBC: 3.07 MIL/uL — ABNORMAL LOW (ref 4.22–5.81)
RDW: 17 % — ABNORMAL HIGH (ref 11.5–15.5)
WBC: 2.9 10*3/uL — ABNORMAL LOW (ref 4.0–10.5)
nRBC: 0 % (ref 0.0–0.2)

## 2022-08-01 MED ORDER — QUETIAPINE FUMARATE 50 MG PO TABS: 50.0000 mg | ORAL_TABLET | Freq: Every day | ORAL | 0 refills | Status: DC

## 2022-08-01 MED ORDER — FUROSEMIDE 10 MG/ML IJ SOLN
40.0000 mg | Freq: Once | INTRAMUSCULAR | Status: AC
  Administered 2022-08-01: 40 mg via INTRAVENOUS
  Filled 2022-08-01: qty 4

## 2022-08-01 MED ORDER — POTASSIUM CHLORIDE CRYS ER 20 MEQ PO TBCR
40.0000 meq | EXTENDED_RELEASE_TABLET | Freq: Once | ORAL | Status: AC
  Administered 2022-08-01: 40 meq via ORAL
  Filled 2022-08-01: qty 2

## 2022-08-01 MED ORDER — POTASSIUM CHLORIDE CRYS ER 20 MEQ PO TBCR: EXTENDED_RELEASE_TABLET | ORAL | 0 refills | Status: DC

## 2022-08-01 NOTE — ED Provider Notes (Addendum)
[place] EMERGENCY DEPARTMENT AT [facility] Provider Note   CSN: [id number] Arrival date & time: 08/01/22  1827     History  Chief Complaint  Patient presents with   Psychiatric Evaluation    Mark Cabrera is a 72 y.o. male.  Pt with hx dementia with behavioral issues, presents from SNF via EMS w ivc.  IVC indicates pt has been uncooperative at times, and at other times makes threatening statements.  EMS and [facility] report that patient has been calm and cooperative w them.  Pt is a limited historian, dementia. Pt indicates in general he feels fine. Indicates normal appetite. He indicates in general he gets along fine with residents and staff at facility, and indicates occasionally they get upset with him when he doesn't act how they want.  Pt indicates occasional irritation to skin of scrotal area - no acute worsening. No dysuria or hematuria. No fever or chills. No report of trauma/fall.   The history is provided by the patient, medical records, the EMS personnel and the police. The history is limited by the condition of the patient.       Home Medications Prior to Admission medications   Medication Sig Start Date End Date Taking? Authorizing Provider  potassium chloride SA (KLOR-CON M) 20 MEQ tablet One po bid x 3 days, then one po once a day 08/01/22  Yes Cathren Laine, MD  QUEtiapine (SEROQUEL) 50 MG tablet Take 1 tablet (50 mg total) by mouth at bedtime. 08/01/22  Yes Cathren Laine, MD  Accu-Chek Softclix Lancets lancets Use as instructed 12/03/20   Dolan Amen, MD  acetaminophen (TYLENOL) 325 MG tablet Take 2 tablets (650 mg total) by mouth every 4 (four) hours as needed for headache or mild pain. 04/28/16   Leone Brand, NP  Alirocumab (PRALUENT) 75 MG/ML SOAJ Inject 1 Pen into the skin every 14 (fourteen) days. 03/21/22   Kathleene Hazel, MD  apixaban (ELIQUIS) 5 MG TABS tablet Take 1 tablet by mouth twice daily Patient taking differently: Take 5 mg by  mouth 2 (two) times daily. 12/08/20   Kathleene Hazel, MD  benzocaine (ORAJEL) 10 % mucosal gel Use as directed 1 Application in the mouth or throat 4 (four) times daily as needed for mouth pain.    [provider]  donepezil (ARICEPT) 5 MG tablet Take 1 tablet (5 mg total) by mouth at bedtime. 05/29/22   Windell Norfolk, MD  escitalopram (LEXAPRO) 10 MG tablet Take 1 tablet (10 mg total) by mouth daily. 12/02/20   Elige Radon, MD  furosemide (LASIX) 40 MG tablet Take 40 mg by mouth 2 (two) times daily.    [provider]  glucose blood (ACCU-CHEK GUIDE) test strip Use as instructed 12/03/20   Dolan Amen, MD  insulin aspart (NOVOLOG FLEXPEN) 100 UNIT/ML FlexPen INJECT 5 UNITS UNDER THE SKIN THREE TIMES DAILY WITH MEALS 03/03/22   Willette Cluster, MD  insulin glargine-yfgn (SEMGLEE) 100 UNIT/ML injection Inject 0.26 mLs (26 Units total) into the skin at bedtime. 03/03/22   Willette Cluster, MD  Insulin Pen Needle 31G X 5 MM MISC Use one pen needle three times daily. Dx E11.49 09/14/21   Evlyn Kanner, MD  ipratropium-albuterol (DUONEB) 0.5-2.5 (3) MG/3ML SOLN Inhale 3 mLs into the lungs every 6 (six) hours as needed (for shortness of breath).    [provider]  metFORMIN (GLUCOPHAGE) 500 MG tablet Take 1 tablet (500 mg total) by mouth 2 (two) times daily with  a meal. 12/30/20 03/19/23  Steffanie Rainwater, MD  metoprolol succinate (TOPROL-XL) 25 MG 24 hr tablet Take 0.5 tablets (12.5 mg total) by mouth daily. 01/26/22 04/26/22  Adron Bene, MD  polyethylene glycol (MIRALAX / GLYCOLAX) 17 g packet Take 17 g by mouth daily. 09/29/21   Morene Crocker, MD  spironolactone (ALDACTONE) 25 MG tablet Take 0.5 tablets (12.5 mg total) by mouth daily. 03/04/22   Willette Cluster, MD  valACYclovir (VALTREX) 1000 MG tablet Take 1,000 mg by mouth 2 (two) times daily.    [provider]  vitamin B-12 (CYANOCOBALAMIN) 100 MCG tablet Take 100 mcg by mouth daily.     [provider]      Allergies    Morpholine salicylate, Penicillins, Morphine and codeine, Sglt2 inhibitors, and Statins    Review of Systems   Review of Systems  Constitutional:  Negative for fever.  HENT:  Negative for sore throat.   Eyes:  Negative for redness.  Respiratory:  Negative for shortness of breath.   Cardiovascular:  Negative for chest pain.  Gastrointestinal:  Negative for abdominal pain and vomiting.  Genitourinary:  Negative for dysuria and flank pain.  Musculoskeletal:  Negative for back pain and neck pain.  Skin:  Negative for rash.  Neurological:  Negative for headaches.  Hematological:  Does not bruise/bleed easily.  Psychiatric/Behavioral:         Hx dementia w behavioral symptoms.     Physical Exam Updated Vital Signs BP 123/76   Pulse 77   Temp 98 F (36.7 C) (Oral)   Resp 18   Ht 1.727 m (5\' 8" )   Wt 85.2 kg   SpO2 100%   BMI 28.56 kg/m  Physical Exam Vitals and nursing note reviewed.  Constitutional:      Appearance: Normal appearance. He is well-developed.  HENT:     Head: Atraumatic.     Nose: Nose normal.     Mouth/Throat:     Mouth: Mucous membranes are moist.     Pharynx: Oropharynx is clear.  Eyes:     General: No scleral icterus.    Conjunctiva/sclera: Conjunctivae normal.     Pupils: Pupils are equal, round, and reactive to light.  Neck:     Vascular: No carotid bruit.     Trachea: No tracheal deviation.     Comments: No stiffness or rigidity.  Cardiovascular:     Rate and Rhythm: Normal rate and regular rhythm.     Pulses: Normal pulses.     Heart sounds: Normal heart sounds. No murmur heard.    No friction rub. No gallop.  Pulmonary:     Effort: Pulmonary effort is normal. No accessory muscle usage or respiratory distress.     Breath sounds: Normal breath sounds.  Abdominal:     General: Bowel sounds are normal. There is no distension.     Palpations: Abdomen is soft.     Tenderness: There is no abdominal  tenderness.  Genitourinary:    Comments: No cva tenderness. Normal external gu exam w exception mild dermatitis c/w fungal dermatitis scrotal area. No abscess. No cellulitis. No testicular pain or tenderness.  Musculoskeletal:        General: No swelling.     Cervical back: Normal range of motion and neck supple. No rigidity.  Skin:    General: Skin is warm and dry.     Findings: No rash.  Neurological:     Mental Status: He is alert.     Comments:  Alert, speech clear. Motor/sens grossly intact bil.   Psychiatric:        Mood and Affect: Mood normal.     Comments: Patient is alert, oriented to person/place. Speech normal. Pt exhibits normal mood and affect Thought processes and behaviors overall appears calm, and reasonably rational. No delusions or hallucinations are noted. Pt denies any thoughts of harm to self, and denies thoughts of harm or hostility towards others.      ED Results / Procedures / Treatments   Labs (all labs ordered are listed, but only abnormal results are displayed) Results for orders placed or performed during the hospital encounter of 08/01/22  Comprehensive metabolic panel  Result Value Ref Range   Sodium 135 135 - 145 mmol/L   Potassium 3.1 (L) 3.5 - 5.1 mmol/L   Chloride 104 98 - 111 mmol/L   CO2 18 (L) 22 - 32 mmol/L   Glucose, Bld 188 (H) 70 - 99 mg/dL   BUN 28 (H) 8 - 23 mg/dL   Creatinine, Ser 1.61 0.61 - 1.24 mg/dL   Calcium 8.3 (L) 8.9 - 10.3 mg/dL   Total Protein 7.3 6.5 - 8.1 g/dL   Albumin 3.3 (L) 3.5 - 5.0 g/dL   AST 22 15 - 41 U/L   ALT 14 0 - 44 U/L   Alkaline Phosphatase 77 38 - 126 U/L   Total Bilirubin 0.9 0.3 - 1.2 mg/dL   GFR, Estimated >09 >60 mL/min   Anion gap 13 5 - 15  CBC  Result Value Ref Range   WBC 2.9 (L) 4.0 - 10.5 K/uL   RBC 3.07 (L) 4.22 - 5.81 MIL/uL   Hemoglobin 9.9 (L) 13.0 - 17.0 g/dL   HCT 45.4 (L) 09.8 - 11.9 %   MCV 100.0 80.0 - 100.0 fL   MCH 32.2 26.0 - 34.0 pg   MCHC 32.2 30.0 - 36.0 g/dL   RDW 14.7  (H) 82.9 - 15.5 %   Platelets 225 150 - 400 K/uL   nRBC 0.0 0.0 - 0.2 %  Brain natriuretic peptide  Result Value Ref Range   B Natriuretic Peptide 1,518.8 (H) 0.0 - 100.0 pg/mL   DG Chest 2 View  Result Date: 08/01/2022 CLINICAL DATA:  CHF.  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 Stable enlarged heart with some vascular congestion. Small left effusion. No pneumothorax or consolidation. Film is under penetrated. IMPRESSION: Enlarged heart with vascular congestion. Prosthetic valve and atrial occlusion clip. Small left effusion. Electronically Signed   By: Karen Kays M.D.   On: 08/01/2022 19:08   DG Pelvis Portable  Result Date: 07/29/2022 CLINICAL DATA:  72 year old male status post fall. EXAM: PORTABLE PELVIS 1-2 VIEWS COMPARISON:  CT Abdomen and Pelvis 01/17/2022. FINDINGS: 2 AP views of the pelvis at 0357 hours. Numerous chronic pelvic phleboliths incidentally noted. Femoral heads normally located. Bone mineralization is within normal limits. No pelvis fracture identified. SI joints appear symmetric, pubic symphysis within normal limits. Grossly intact proximal femurs. Aortoiliac calcified atherosclerosis. Negative visible bowel gas. IMPRESSION: 1. No acute fracture or dislocation identified about the pelvis. 2.  Aortic Atherosclerosis (ICD10-I70.0). Electronically Signed   By: Odessa Fleming M.D.   On: 07/29/2022 04:10   CT Head Wo Contrast  Result Date: 07/29/2022 CLINICAL DATA:  Altered mental status. EXAM: CT HEAD WITHOUT CONTRAST TECHNIQUE: Contiguous axial images were obtained from the base of the skull through the vertex without  intravenous contrast. RADIATION DOSE REDUCTION: This exam was performed according to the departmental dose-optimization program which includes automated exposure control, adjustment of the mA and/or kV according to patient size and/or use of iterative reconstruction  technique. COMPARISON:  January 17, 2022 FINDINGS: Brain: There is mild cerebral atrophy with widening of the extra-axial spaces and ventricular dilatation. There are areas of decreased attenuation within the white matter tracts of the supratentorial brain, consistent with microvascular disease changes. A small, chronic left occipital lobe infarct is seen. Vascular: No hyperdense vessel or unexpected calcification. Skull: Normal. Negative for fracture or focal lesion. Sinuses/Orbits: Stable postoperative changes are seen involving the left globe. Other: None. IMPRESSION: 1. No acute intracranial abnormality. 2. Small, chronic left occipital lobe infarct. 3. Generalized cerebral atrophy with widening of the extra-axial spaces and ventricular dilatation. Electronically Signed   By: Aram Candela M.D.   On: 07/29/2022 00:20   DG Chest Port 1 View  Result Date: 07/28/2022 CLINICAL DATA:  Shortness of breath. EXAM: PORTABLE CHEST 1 VIEW COMPARISON:  March 02, 2022 FINDINGS: The cardiac silhouette is mildly enlarged and unchanged in size. Radiopaque atrial appendage clip and artificial aortic valve are seen. There is moderate to marked severity calcification of the thoracic aorta. Mild, diffusely increased lung markings are seen with mild prominence of the perihilar pulmonary vasculature. This is more prominent on the left and may be positional in origin. No pleural effusion or pneumothorax is identified. Degenerative changes seen throughout the thoracic spine. IMPRESSION: 1. Cardiomegaly with findings suggestive of mild pulmonary vascular congestion. Electronically Signed   By: Aram Candela M.D.   On: 07/28/2022 23:43    EKG EKG Interpretation  Date/Time:  Tuesday Aug 01 2022 22:16:38 EDT Ventricular Rate:  78 PR Interval:    QRS Duration: 118 QT Interval:  444 QTC Calculation: 506 R Axis:   -67 Text Interpretation: Atrial fibrillation Left axis deviation Non-specific ST-t changes Confirmed by  Cathren Laine ([id number]) on 08/01/2022 10:19:28 PM  Radiology DG Chest 2 View  Result Date: 08/01/2022 CLINICAL DATA:  CHF.  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 Stable enlarged heart with some vascular congestion. Small left effusion. No pneumothorax or consolidation. Film is under penetrated. IMPRESSION: Enlarged heart with vascular congestion. Prosthetic valve and atrial occlusion clip. Small left effusion. Electronically Signed   By: Karen Kays M.D.   On: 08/01/2022 19:08    Procedures Procedures    Medications Ordered in ED Medications  furosemide (LASIX) injection 40 mg (40 mg Intravenous Given 08/01/22 2010)  potassium chloride SA (KLOR-CON M) CR tablet 40 mEq (40 mEq Oral Given 08/01/22 2122)    ED Course/ Medical Decision Making/ A&P                             Medical Decision Making Problems Addressed: Acute on chronic combined systolic and diastolic CHF (congestive heart failure) (HCC): acute illness or injury with systemic symptoms that poses a threat to life or bodily functions Acute on chronic diastolic heart failure with preserved ejection fraction [facility]): acute illness or injury with systemic symptoms that poses a threat to life or bodily functions Chronic atrial fibrillation (HCC): chronic illness or injury that poses a threat to life or bodily functions Chronic dementia [facility]): chronic illness or injury with exacerbation, progression, or side effects of treatment that poses a threat to life or bodily  functions Dementia associated with other underlying disease with behavioral disturbance [facility]): chronic illness or injury with exacerbation, progression, or side effects of treatment that poses a threat to life or bodily functions Hypokalemia: acute illness or injury  Amount and/or Complexity of Data Reviewed Independent Historian: EMS    Details: hx External Data Reviewed:  notes. Labs: ordered. Decision-making details documented in ED Course. Radiology: ordered and independent interpretation performed. Decision-making details documented in ED Course. ECG/medicine tests: ordered and independent interpretation performed. Decision-making details documented in ED Course.  Risk Prescription drug management. Decision regarding hospitalization.   Iv ns. Continuous pulse ox and cardiac monitoring. Labs ordered/sent. Imaging ordered.   Differential diagnosis includes dementia, dementia with behavioral disturbance, etc. Dispo decision including potential need for admission considered - will get labs and imaging and reassess.   Reviewed nursing notes and prior charts for additional history. External reports reviewed. Additional history from: EMS/[facility] - both report patient calm and cooperative.   Cardiac monitor: sinus rhythm, rate 80.  Labs reviewed/interpreted by me - wbc and hgb c/w prior. K mildly low, kcl po. Bnp elevated but less so than previous. Pt denies chest  pain, discomfort or sob. No increased wob. Extra dose of patients lasix given iv.   Xrays reviewed/interpreted by me - no pna , +vascular congestion.   Recent CT imaging reviewed/interpreted by me - no hem or acute process.   Pt remains calm and cooperative. IVC reviewed - no harm to others, pt had been noted to spit on one occasion, and overall behaviors described appear c/w noted history of dementia with behavioral symptoms/issues. Pt denies any thoughts of harm to self or others. No acute psychosis is noted. On patients ED eval, no indication to uphold ivc, ivc rescinded.   It does appear patient has chronic dementia with chronic, intermittent behavioral challenges. It appears likely that these behaviors will continue on intermittent basis, and likely best managed in an environment that is stable and familiar to patient, with staff familiar w patient, and with an abundance of supportive, de-escalating  techniques and patience.  Pt is on aricept and is also and was previously prescribed seroquel (although appear was initially prescribed a two week course).  Rec continue aricept and seroquel. Should current facility feel patient would benefit from placement in alternate facility/dementia unit, they may work with patient and patients family to pursue those options from his current home/residence (which is his SNF). Also rec outpatient pcp follow up. Will also rec outpatient gero-psych f/u.  For chronic chf, will refer to close cardiology f/u.   Pt remains calm, cooperative, breathing comfortably, and currently appears stable for d/c.   Return precautions provided.         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   08/01/22 2149               Cathren Laine, MD 08/01/22 2220

## 2022-08-01 NOTE — Discharge Instructions (Addendum)
It was our pleasure to provide your ER care today - we hope that you feel better.  For fluid/chf, take one extra of your lasix tablets each morning for the next two days. Also limit salt intake and follow heart healthy eating plan. Follow up closely with your cardiologist in the coming week.   From the lab tests, your potassium level is low (3.1) - eat plenty of fruits and vegetables, take potassium supplement as prescribed, and follow up with primary care doctor in one week.    For dementia/behavioral symptoms, continue your aricept. Also take seroquel as prescribed. Follow up closely with your primary care doctor in the next few days - discuss arrangements for outpatient neurology and gero-psych follow up with your doctor.  (Note that challenging behaviors associated with dementia are best managed in a stable environment with staff familiar to patient, using de-escalation and other supportive techniques, and an abundance of patience and understanding. Should patient, family, or facility wish to pursue alternate placement from patients current home at his current facility, please have facility social workers reach out to patient and family to explore those discussions).    Return to ER if worse, new symptoms, fevers, chest pain, increased trouble breathing, or other emergency concern.

## 2022-08-01 NOTE — Telephone Encounter (Signed)
Daughter wants a call back from RN Carlyle to discuss patient's upcoming visit.

## 2022-08-01 NOTE — Telephone Encounter (Signed)
Spoke with a nurse at the patient's nursing home. The patient was recently in the ER for CHF exacerbation with elevated BNP and was given extra Lasix. Nurse states that he patient has still been complaining of some shortness of breath over the last couple of days. No apparent swelling. Patient has been on supplemental oxygen and O2 has been good. Patient was seen by the provider at the nursing facility who is recommending that the patient be seen by cardiology. Patient scheduled for an appointment with DOD on Thursday.

## 2022-08-01 NOTE — ED Notes (Signed)
Attempted report to [facility] x2. No answer.

## 2022-08-01 NOTE — Telephone Encounter (Signed)
Spoke with the patient's daughter and rescheduled appointment

## 2022-08-01 NOTE — Telephone Encounter (Signed)
Pt c/o Shortness Of Breath: STAT if SOB developed within the last 24 hours or pt is noticeably SOB on the phone  1. Are you currently SOB (can you hear that pt is SOB on the phone)?   No  2. How long have you been experiencing SOB?   Started yesterday  3. Are you SOB when sitting or when up moving around?   Both  4. Are you currently experiencing any other symptoms? Blurred vision in left eye  Caller stated patient reported having SOB yesterday.  Caller reported patient has elevated BNP.   Caller wanted call back to nurse [employer].

## 2022-08-01 NOTE — ED Notes (Signed)
[facility] called  

## 2022-08-01 NOTE — ED Triage Notes (Signed)
Per EMS pt is coming from [facility]. Facility IVC'ed pt due to aggression and confusion. EMS states they have not have not had any issue with pt during transport, but did notice bruising on his back.

## 2022-08-01 NOTE — Progress Notes (Deleted)
No chief complaint on file.  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 He was admitted to [facility] January 2018 and underwent surgical AVR for AS and 2V CABG (SVG to PDA and SVG to OM1) along with left atrial appendage clipping per Dr. Dorris Fetch. He has post-op atrial fib and volume overload. His post-operative course was also complicated by a large pericardial effusion and pleural effusion. He was readmitted and had thoracentesis and pericardial window. He had issues with volume overload post-op and was started on Lasix. Echo April 2020 with LVEF=50-55%, severe LVH, AV bioprosthetic valve working well. The left atrium was severely dilated. Ongoing chest pain since surgery. Sternal wire removal June 2019. Stroke April 2020 felt to be due to an embolic event. He was started on Eliquis. He was admitted to [facility] with chest pain 01/10/19. Cardiac cath 01/10/19 with occlusion of the SVG to the OM, severe stenosis in the body of the SVG to the PDA and CTO of the LAD. A drug eluting stent was placed in the mid LAD (2.75 x 32 mm Synergy DES) and a drug eluting stent was placed in the body of the SVG to the PDA (3.5 x 20 mm Synergy DES).  (Dr. Marlaine Hind).  He was discharged on ASA and Plavix but both have been stopped. He was seen in the [facility] ED 02/20/19 with c/o chest pain but no evidence of ACS. Echo 04/11/19 with LVEF=50-55%, severe LVH, normally functioning AVR. He was seen in our office 03/16/20 by Tereso Newcomer, PA-C and had c/o chest pain post Covid, not felt to be cardiac related. Echo 04/29/20 with LVEF=50-55%, mild MR, no pericardial effusion. He was seen in our office in February 2022 and was doing well. He was admitted to [facility] December 2023 with dyspnea, found to have CHF. He underwent  thoracentesis for treatment of a pleural effusion and was diuresed. *** rest of hospital visit   He is here today for follow up. The patient denies any chest pain, dyspnea, palpitations, lower extremity edema, orthopnea, PND, dizziness, near syncope or syncope.  *** He is now living in assisted living in [place].  Primary Care Physician: Evlyn Kanner, MD  Past Medical History:  Diagnosis Date   Acute diastolic congestive heart failure (HCC)    AKI (acute kidney injury) (HCC)    Aortic valve stenosis s/p AVR 2018   Echo 2/22: Poor acoustic windows, EF 50-55, mild LVH, normal RVSF, mild MR, trivial AI, no AS, no pericardial effusion   Atrial fibrillation (HCC) - post-op CABG    04/2016 CHA2DS2VAS score = 5   Atypical nevi    Coronary artery disease s/p 2 vessel CABG    Depression    "years ago"   Diabetes mellitus    Dyspnea    in the past    GERD (gastroesophageal reflux disease)    Heart murmur    Hepatic cirrhosis (HCC) 06/29/2017   History of kidney stones    Hyperlipidemia    hx of transaminitis secondary to statin and he has decided not to use statins secondary to potential side effects.   Hypertension    Nephrolithiasis    Osteoarthritis, knee    Pericardial effusion a. subxiphoid pericardial window on 04/21/2016 04/28/2016   Peripheral neuropathic pain 05/12/2016  Sleep apnea     Central apnea. Not using cpap   Stroke 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 2-D echo was normal no embolic source found.    Past Surgical History:  Procedure Laterality Date   AORTIC VALVE REPLACEMENT N/A 03/30/2016   Procedure: AORTIC VALVE REPLACEMENT  (AVR);  Surgeon: Loreli Slot, MD;  Location: [facility] OR;  Service: Open Heart Surgery;  Laterality: N/A;   CARDIAC CATHETERIZATION N/A 03/15/2016   Procedure: Right/Left Heart Cath and Coronary Angiography;  Surgeon: Kathleene Hazel, MD;  Location: [facility] INVASIVE CV LAB;  Service: Cardiovascular;  Laterality: N/A;   CLIPPING OF ATRIAL APPENDAGE N/A 03/30/2016   Procedure: CLIPPING OF LEFT ATRIAL APPENDAGE;  Surgeon: Loreli Slot, MD;  Location: [facility] OR;  Service: Open Heart Surgery;  Laterality: N/A;   CORONARY ARTERY BYPASS GRAFT N/A 03/30/2016   Procedure: CORONARY ARTERY BYPASS GRAFTING (CABG) Times Two;  Surgeon: Loreli Slot, MD;  Location: [facility] OR;  Service: Open Heart Surgery;  Laterality: N/A;   IR THORACENTESIS ASP PLEURAL SPACE W/IMG GUIDE  02/28/2022   IR THORACENTESIS ASP PLEURAL SPACE W/IMG GUIDE  03/02/2022   KNEE ARTHROSCOPY W/ PARTIAL MEDIAL MENISCECTOMY  05/12/2005   right, performed by Dr. Madelon Lips for torn medial meniscus.   ROTATOR CUFF REPAIR Right 2016   STERNAL WIRES REMOVAL N/A 08/13/2017   Procedure: STERNAL WIRES REMOVAL;  Surgeon: Loreli Slot, MD;  Location: [facility] OR;  Service: Thoracic;  Laterality: N/A;   SUBXYPHOID PERICARDIAL WINDOW N/A 04/21/2016   Procedure: SUBXYPHOID PERICARDIAL WINDOW;  Surgeon: Loreli Slot, MD;  Location: [facility] OR;  Service: Thoracic;  Laterality: N/A;   TEE WITHOUT CARDIOVERSION N/A 03/30/2016   Procedure: TRANSESOPHAGEAL ECHOCARDIOGRAM (TEE);  Surgeon: Loreli Slot, MD;  Location: [facility] OR;  Service: Open Heart Surgery;  Laterality: N/A;   TEE WITHOUT CARDIOVERSION N/A 04/21/2016   Procedure: TRANSESOPHAGEAL ECHOCARDIOGRAM (TEE);  Surgeon: Loreli Slot, MD;  Location: [facility] OR;  Service: Thoracic;  Laterality: N/A;    Current Outpatient Medications  Medication Sig Dispense Refill   Accu-Chek Softclix Lancets lancets Use as instructed 100 each 12   acetaminophen (TYLENOL) 325 MG tablet Take 2 tablets (650  mg total) by mouth every 4 (four) hours as needed for headache or mild pain.     Alirocumab (PRALUENT) 75 MG/ML SOAJ Inject 1 Pen into the skin every 14 (fourteen) days. 2 mL 11   apixaban (ELIQUIS) 5 MG TABS tablet Take 1 tablet by mouth twice daily (Patient taking differently: Take 5 mg by mouth 2 (two) times daily.) 180 tablet 1   benzocaine (ORAJEL) 10 % mucosal gel Use as directed 1 Application in the mouth or throat 4 (four) times daily as needed for mouth pain.     donepezil (ARICEPT) 5 MG tablet Take 1 tablet (5 mg total) by mouth at bedtime. 30 tablet 11   escitalopram (LEXAPRO) 10 MG tablet Take 1 tablet (10 mg total) by mouth daily. 90 tablet 0   furosemide (LASIX) 40 MG tablet Take 40 mg by mouth 2 (two) times daily.     glucose blood (ACCU-CHEK GUIDE) test strip Use as instructed 100 each  12   insulin aspart (NOVOLOG FLEXPEN) 100 UNIT/ML FlexPen INJECT 5 UNITS UNDER THE SKIN THREE TIMES DAILY WITH MEALS 45 mL 0   insulin glargine-yfgn (SEMGLEE) 100 UNIT/ML injection Inject 0.26 mLs (26 Units total) into the skin at bedtime. 10 mL 11   Insulin Pen Needle 31G X 5 MM MISC Use one pen needle three times daily. Dx E11.49 100 each 3   ipratropium-albuterol (DUONEB) 0.5-2.5 (3) MG/3ML SOLN Inhale 3 mLs into the lungs every 6 (six) hours as needed (for shortness of breath).     metFORMIN (GLUCOPHAGE) 500 MG tablet Take 1 tablet (500 mg total) by mouth 2 (two) times daily with a meal. 90 tablet 3   metoprolol succinate (TOPROL-XL) 25 MG 24 hr tablet Take 0.5 tablets (12.5 mg total) by mouth daily. 15 tablet 2   polyethylene glycol (MIRALAX / GLYCOLAX) 17 g packet Take 17 g by mouth daily. 14 each 0   QUEtiapine (SEROQUEL XR) 50 MG TB24 24 hr tablet Take 1 tablet (50 mg total) by mouth at bedtime for 14 days. 14 tablet 0   spironolactone (ALDACTONE) 25 MG tablet Take 0.5 tablets (12.5 mg total) by mouth daily.     valACYclovir (VALTREX) 1000 MG tablet Take 1,000 mg by mouth 2 (two) times daily.      vitamin B-12 (CYANOCOBALAMIN) 100 MCG tablet Take 100 mcg by mouth daily.     No current facility-administered medications for this visit.    Allergies  Allergen Reactions   Morpholine Salicylate Other (See Comments)    Hallucinations   Penicillins Other (See Comments)    Allergic- reaction?? Has patient had a PCN reaction causing immediate rash, facial/tongue/throat swelling, SOB or lightheadedness with hypotension: Unk Has patient had a PCN reaction causing severe rash involving mucus membranes or skin necrosis: Unk Has patient had a PCN reaction that required hospitalization: Unk Has patient had a PCN reaction occurring within the last 10 years: No If all of the above answers are "NO", then may proceed with Cephalosporin use.   Morphine And Codeine Other (See Comments)    Hallucinations    Sglt2 Inhibitors Rash and Other (See Comments)    Candidiasis infection prone   Statins Rash and Other (See Comments)    Severe rash and back pain, and muscle pain    Social History   Socioeconomic History   Marital status: Divorced    Spouse name: Not on file   Number of children: 1   Years of education: 2y college   Highest education level: Not on file  Occupational History   Occupation:  Medicaid /disability    Employer: UNEMPLOYED    Comment: following stroke in 2007  Tobacco Use   Smoking status: Never   Smokeless tobacco: Never  Vaping Use   Vaping Use: Never used  Substance and Sexual Activity   Alcohol use: Yes    Alcohol/week: 1.0 standard drink of alcohol    Types: 1 Cans of beer per week    Comment: occasional   Drug use: No   Sexual activity: Not Currently  Other Topics Concern   Not on file  Social History Narrative   Single but has a girlfriend.    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       Patient has medications disability post stroke.            Social Determinants of Health   Financial Resource Strain: Medium  Risk (01/18/2022)  Overall Financial Resource Strain (CARDIA)    Difficulty of Paying Living Expenses: Somewhat hard  Food Insecurity: No Food Insecurity (02/27/2022)   Hunger Vital Sign    Worried About Running Out of Food in the Last Year: Never true    Ran Out of Food in the Last Year: Never true  Recent Concern: Food Insecurity - Food Insecurity Present (01/21/2022)   Hunger Vital Sign    Worried About Running Out of Food in the Last Year: Sometimes true    Ran Out of Food in the Last Year: Sometimes true  Transportation Needs: Unmet Transportation Needs (02/27/2022)   PRAPARE - Administrator, Civil Service (Medical): Yes    Lack of Transportation (Non-Medical): Yes  Physical Activity: Inactive (01/18/2022)   Exercise Vital Sign    Days of Exercise per Week: 0 days    Minutes of Exercise per Session: 0 min  Stress: Stress Concern Present (01/18/2022)   [employer] of Occupational Health - Occupational Stress Questionnaire    Feeling of Stress : Very much  Social Connections: Moderately Isolated (01/18/2022)   Social Connection and Isolation Panel [NHANES]    Frequency of Communication with Friends and Family: More than three times a week    Frequency of Social Gatherings with Friends and Family: Once a week    Attends Religious Services: 1 to 4 times per year    Active Member of [employer] or Organizations: No    Attends Banker Meetings: Never    Marital Status: Divorced  Catering manager Violence: Not At Risk (02/27/2022)   Humiliation, Afraid, Rape, and Kick questionnaire    Fear of Current or Ex-Partner: No    Emotionally Abused: No    Physically Abused: No    Sexually Abused: No    Family History  Problem Relation Age of Onset   Hypertension Mother    Diabetes Mother    Alcohol abuse Father     Review of Systems:  As stated in the HPI and otherwise negative.   There were no vitals taken for this visit.  Physical Examination:  General:  Well developed, well nourished, NAD  HEENT: OP clear, mucus membranes moist  SKIN: warm, dry. No rashes. Neuro: No focal deficits  Musculoskeletal: Muscle strength 5/5 all ext  Psychiatric: Mood and affect normal  Neck: No JVD, no carotid bruits, no thyromegaly, no lymphadenopathy.  Lungs:Clear bilaterally, no wheezes, rhonci, crackles Cardiovascular: Regular rate and rhythm. No murmurs, gallops or rubs. Abdomen:Soft. Bowel sounds present. Non-tender.  Extremities: No lower extremity edema. Pulses are 2 + in the bilateral DP/PT.  Echo 04/29/20:  1. Poor acoustic windows. Cannot fully evaluate regional wall motion as  endocardium is difficult to see. Overall no significant difference from  echo done in 04/11/19. Left ventricular ejection fraction, by estimation,  is 50 to 55%. The left ventricle has   low normal function. The left ventricular internal cavity size was  moderately to severely dilated. There is mild left ventricular  hypertrophy. Left ventricular diastolic parameters were normal.   2. Right ventricular systolic function is normal. The right ventricular  size is normal. There is normal pulmonary artery systolic pressure.   3. The mitral valve is grossly normal. Mild mitral valve regurgitation.   4. The aortic valve was not well visualized. Aortic valve regurgitation  is trivial. No aortic stenosis is present.   5. The inferior vena cava is normal in size with greater than 50%  respiratory variability, suggesting right atrial  pressure of 3 mmHg.   EKG:  EKG is ordered today. The ekg ordered today demonstrates Sinus, LAD, IVCD-unchanged  Recent Labs: 03/03/2022: Magnesium 1.6 05/29/2022: TSH 1.770 07/28/2022: ALT 14; B Natriuretic Peptide 2,006.3; BUN 23; Creatinine, Ser 1.24; Hemoglobin 10.0; Platelets 338; Potassium 3.0; Sodium 132   Lipid Panel    Component Value Date/Time   CHOL 133 04/09/2020 0814   TRIG 79 04/09/2020 0814   HDL 49 04/09/2020 0814   CHOLHDL 2.7  04/09/2020 0814   CHOLHDL 6.1 06/29/2018 0420   VLDL 48 (H) 06/29/2018 0420   LDLCALC 68 04/09/2020 0814     Wt Readings from Last 3 Encounters:  07/28/22 85.2 kg  03/03/22 85.2 kg  01/17/22 77.6 kg     Other studies Reviewed: Additional studies/ records that were reviewed today include: . Review of the above records demonstrates:   Assessment and Plan:   1. CAD without angina: I do not think the recent chest pain was cardiac. EKG is unchanged. No exertional chest pain. He had PCI/stenting of the mid LAD and SVG to PDA in an outside hospital October 2020. He is not on an ASA since he is on Eliquis.   He is statin intolerant and is now on Praluent and Bempedoic acid. Will add Imdur 30 mg daily. NTG prn.   2. Pericardial effusion: Resolved s/p pericardial window. No effusion on echo February 2022.   3. Aortic stenosis: s/p surgical AVR with bioprosthetic AVR. His bioprosthetic valve is working well by echo in  February 2022.   4. Atrial fib, paroxysmal: Sinus today. He had a stroke in April 2020 and another stroke remotely in 2007. Continue Eliquis.    5. HTN: BP is controlled. No changes today  6. Hyperlipidemia: He is statin intolerant. He is no longer on Zetia. Continue Praluent and Bempedoic acid.     7. Chronic diastolic CHF: No volume overload on exam. Weight is stable. Continue Lasix.    Current medicines are reviewed at length with the patient today.  The patient does not have concerns regarding medicines.  The following changes have been made:  no change  Labs/ tests ordered today include:   No orders of the defined types were placed in this encounter.  Disposition:   FU with me in 12 months   Signed, Verne Carrow, MD 08/01/2022 12:04 PM    [facility] [address] [place], [place], [place]  [address] Phone: [phone]; Fax: [phone]

## 2022-08-02 ENCOUNTER — Encounter: Payer: Self-pay | Admitting: Neurology

## 2022-08-02 ENCOUNTER — Encounter (HOSPITAL_COMMUNITY): Payer: Self-pay

## 2022-08-02 ENCOUNTER — Telehealth: Payer: Self-pay | Admitting: Neurology

## 2022-08-02 ENCOUNTER — Inpatient Hospital Stay (HOSPITAL_COMMUNITY)
Admission: EM | Admit: 2022-08-02 | Discharge: 2022-08-14 | DRG: 291 | Disposition: A | Discharge status: Skilled Nursing Facility | Payer: Medicare Other | Source: Skilled Nursing Facility | Attending: Internal Medicine | Admitting: Internal Medicine

## 2022-08-02 ENCOUNTER — Telehealth: Payer: Self-pay | Admitting: Cardiovascular Disease

## 2022-08-02 ENCOUNTER — Emergency Department (HOSPITAL_COMMUNITY): Payer: Medicare Other

## 2022-08-02 ENCOUNTER — Ambulatory Visit: Payer: Medicare Other | Admitting: Cardiovascular Disease

## 2022-08-02 DIAGNOSIS — F02818 Dementia in other diseases classified elsewhere, unspecified severity, with other behavioral disturbance: Secondary | ICD-10-CM | POA: Diagnosis present

## 2022-08-02 DIAGNOSIS — Z952 Presence of prosthetic heart valve: Secondary | ICD-10-CM

## 2022-08-02 DIAGNOSIS — I5021 Acute systolic (congestive) heart failure: Secondary | ICD-10-CM | POA: Diagnosis present

## 2022-08-02 DIAGNOSIS — Z8249 Family history of ischemic heart disease and other diseases of the circulatory system: Secondary | ICD-10-CM

## 2022-08-02 DIAGNOSIS — I4819 Other persistent atrial fibrillation: Secondary | ICD-10-CM | POA: Diagnosis present

## 2022-08-02 DIAGNOSIS — I110 Hypertensive heart disease with heart failure: Secondary | ICD-10-CM | POA: Diagnosis not present

## 2022-08-02 DIAGNOSIS — Z8673 Personal history of transient ischemic attack (TIA), and cerebral infarction without residual deficits: Secondary | ICD-10-CM

## 2022-08-02 DIAGNOSIS — I5043 Acute on chronic combined systolic (congestive) and diastolic (congestive) heart failure: Secondary | ICD-10-CM | POA: Diagnosis not present

## 2022-08-02 DIAGNOSIS — I509 Heart failure, unspecified: Secondary | ICD-10-CM | POA: Diagnosis not present

## 2022-08-02 DIAGNOSIS — D72819 Decreased white blood cell count, unspecified: Secondary | ICD-10-CM | POA: Diagnosis present

## 2022-08-02 DIAGNOSIS — R0602 Shortness of breath: Secondary | ICD-10-CM

## 2022-08-02 DIAGNOSIS — R4689 Other symptoms and signs involving appearance and behavior: Secondary | ICD-10-CM | POA: Diagnosis present

## 2022-08-02 DIAGNOSIS — I255 Ischemic cardiomyopathy: Secondary | ICD-10-CM | POA: Diagnosis present

## 2022-08-02 DIAGNOSIS — I472 Ventricular tachycardia, unspecified: Secondary | ICD-10-CM | POA: Diagnosis present

## 2022-08-02 DIAGNOSIS — R456 Violent behavior: Secondary | ICD-10-CM | POA: Diagnosis not present

## 2022-08-02 DIAGNOSIS — K219 Gastro-esophageal reflux disease without esophagitis: Secondary | ICD-10-CM | POA: Diagnosis present

## 2022-08-02 DIAGNOSIS — E1165 Type 2 diabetes mellitus with hyperglycemia: Secondary | ICD-10-CM | POA: Diagnosis present

## 2022-08-02 DIAGNOSIS — E876 Hypokalemia: Secondary | ICD-10-CM | POA: Diagnosis present

## 2022-08-02 DIAGNOSIS — N5082 Scrotal pain: Secondary | ICD-10-CM | POA: Diagnosis present

## 2022-08-02 DIAGNOSIS — I480 Paroxysmal atrial fibrillation: Secondary | ICD-10-CM | POA: Diagnosis present

## 2022-08-02 DIAGNOSIS — F03918 Unspecified dementia, unspecified severity, with other behavioral disturbance: Secondary | ICD-10-CM

## 2022-08-02 DIAGNOSIS — I4891 Unspecified atrial fibrillation: Secondary | ICD-10-CM | POA: Diagnosis not present

## 2022-08-02 DIAGNOSIS — Z66 Do not resuscitate: Secondary | ICD-10-CM | POA: Diagnosis present

## 2022-08-02 DIAGNOSIS — R739 Hyperglycemia, unspecified: Secondary | ICD-10-CM | POA: Diagnosis not present

## 2022-08-02 DIAGNOSIS — F0284 Dementia in other diseases classified elsewhere, unspecified severity, with anxiety: Secondary | ICD-10-CM | POA: Diagnosis present

## 2022-08-02 DIAGNOSIS — R0902 Hypoxemia: Secondary | ICD-10-CM | POA: Diagnosis present

## 2022-08-02 DIAGNOSIS — K7460 Unspecified cirrhosis of liver: Secondary | ICD-10-CM | POA: Diagnosis present

## 2022-08-02 DIAGNOSIS — Z7984 Long term (current) use of oral hypoglycemic drugs: Secondary | ICD-10-CM

## 2022-08-02 DIAGNOSIS — Z885 Allergy status to narcotic agent status: Secondary | ICD-10-CM

## 2022-08-02 DIAGNOSIS — Z833 Family history of diabetes mellitus: Secondary | ICD-10-CM

## 2022-08-02 DIAGNOSIS — I5032 Chronic diastolic (congestive) heart failure: Secondary | ICD-10-CM | POA: Diagnosis present

## 2022-08-02 DIAGNOSIS — I493 Ventricular premature depolarization: Secondary | ICD-10-CM | POA: Diagnosis present

## 2022-08-02 DIAGNOSIS — R7989 Other specified abnormal findings of blood chemistry: Secondary | ICD-10-CM

## 2022-08-02 DIAGNOSIS — E440 Moderate protein-calorie malnutrition: Secondary | ICD-10-CM | POA: Diagnosis present

## 2022-08-02 DIAGNOSIS — Z880 Allergy status to penicillin: Secondary | ICD-10-CM

## 2022-08-02 DIAGNOSIS — Z5982 Transportation insecurity: Secondary | ICD-10-CM

## 2022-08-02 DIAGNOSIS — E871 Hypo-osmolality and hyponatremia: Secondary | ICD-10-CM | POA: Diagnosis present

## 2022-08-02 DIAGNOSIS — Z6825 Body mass index (BMI) 25.0-25.9, adult: Secondary | ICD-10-CM

## 2022-08-02 DIAGNOSIS — Z7901 Long term (current) use of anticoagulants: Secondary | ICD-10-CM

## 2022-08-02 DIAGNOSIS — B379 Candidiasis, unspecified: Secondary | ICD-10-CM | POA: Diagnosis present

## 2022-08-02 DIAGNOSIS — I2510 Atherosclerotic heart disease of native coronary artery without angina pectoris: Secondary | ICD-10-CM | POA: Diagnosis present

## 2022-08-02 DIAGNOSIS — I5023 Acute on chronic systolic (congestive) heart failure: Secondary | ICD-10-CM | POA: Diagnosis present

## 2022-08-02 DIAGNOSIS — Z794 Long term (current) use of insulin: Secondary | ICD-10-CM

## 2022-08-02 DIAGNOSIS — R778 Other specified abnormalities of plasma proteins: Secondary | ICD-10-CM | POA: Diagnosis not present

## 2022-08-02 DIAGNOSIS — Z955 Presence of coronary angioplasty implant and graft: Secondary | ICD-10-CM

## 2022-08-02 DIAGNOSIS — J90 Pleural effusion, not elsewhere classified: Secondary | ICD-10-CM | POA: Diagnosis not present

## 2022-08-02 DIAGNOSIS — Z5986 Financial insecurity: Secondary | ICD-10-CM

## 2022-08-02 DIAGNOSIS — Z79899 Other long term (current) drug therapy: Secondary | ICD-10-CM

## 2022-08-02 DIAGNOSIS — I482 Chronic atrial fibrillation, unspecified: Secondary | ICD-10-CM

## 2022-08-02 DIAGNOSIS — Z888 Allergy status to other drugs, medicaments and biological substances status: Secondary | ICD-10-CM

## 2022-08-02 DIAGNOSIS — Z811 Family history of alcohol abuse and dependence: Secondary | ICD-10-CM

## 2022-08-02 DIAGNOSIS — E785 Hyperlipidemia, unspecified: Secondary | ICD-10-CM | POA: Diagnosis present

## 2022-08-02 DIAGNOSIS — I2489 Other forms of acute ischemic heart disease: Secondary | ICD-10-CM | POA: Diagnosis present

## 2022-08-02 LAB — BRAIN NATRIURETIC PEPTIDE: B Natriuretic Peptide: 1451.9 pg/mL — ABNORMAL HIGH (ref 0.0–100.0)

## 2022-08-02 LAB — BASIC METABOLIC PANEL
Anion gap: 14 (ref 5–15)
BUN: 31 mg/dL — ABNORMAL HIGH (ref 8–23)
CO2: 19 mmol/L — ABNORMAL LOW (ref 22–32)
Calcium: 8.4 mg/dL — ABNORMAL LOW (ref 8.9–10.3)
Chloride: 99 mmol/L (ref 98–111)
Creatinine, Ser: 1.28 mg/dL — ABNORMAL HIGH (ref 0.61–1.24)
GFR, Estimated: 59 mL/min — ABNORMAL LOW (ref >60)
Glucose, Bld: 287 mg/dL — ABNORMAL HIGH (ref 70–99)
Potassium: 4.1 mmol/L (ref 3.5–5.1)
Sodium: 132 mmol/L — ABNORMAL LOW (ref 135–145)

## 2022-08-02 LAB — GLUCOSE, CAPILLARY
Glucose-Capillary: 233 mg/dL — ABNORMAL HIGH (ref 70–99)
Glucose-Capillary: 163 mg/dL — ABNORMAL HIGH (ref 70–99)

## 2022-08-02 LAB — MRSA NEXT GEN BY PCR, NASAL: MRSA by PCR Next Gen: DETECTED — AB

## 2022-08-02 LAB — VALPROIC ACID LEVEL: Valproic Acid Lvl: <10 ug/mL — ABNORMAL LOW (ref 50.0–100.0)

## 2022-08-02 LAB — MAGNESIUM: Magnesium: 1.5 mg/dL — ABNORMAL LOW (ref 1.7–2.4)

## 2022-08-02 LAB — TROPONIN I (HIGH SENSITIVITY): Troponin I (High Sensitivity): 29 ng/L — ABNORMAL HIGH (ref <18)

## 2022-08-02 MED ORDER — METFORMIN HCL 500 MG PO TABS: 500.0000 mg | ORAL_TABLET | Freq: Two times a day (BID) | ORAL | Status: DC

## 2022-08-02 MED ORDER — BUSPIRONE HCL 5 MG PO TABS
7.5000 mg | ORAL_TABLET | Freq: Two times a day (BID) | ORAL | Status: DC
  Administered 2022-08-02 – 2022-08-09 (×15): 7.5 mg via ORAL
  Filled 2022-08-02 (×2): qty 2
  Filled 2022-08-02: qty 1.5
  Filled 2022-08-02 (×8): qty 2
  Filled 2022-08-02: qty 1.5
  Filled 2022-08-02 (×3): qty 2

## 2022-08-02 MED ORDER — DIVALPROEX SODIUM 250 MG PO DR TAB
250.0000 mg | DELAYED_RELEASE_TABLET | Freq: Two times a day (BID) | ORAL | Status: DC
  Administered 2022-08-02 – 2022-08-14 (×24): 250 mg via ORAL
  Filled 2022-08-02 (×24): qty 1

## 2022-08-02 MED ORDER — FUROSEMIDE 10 MG/ML IJ SOLN
40.0000 mg | Freq: Once | INTRAMUSCULAR | Status: AC
  Administered 2022-08-02: 40 mg via INTRAVENOUS
  Filled 2022-08-02 (×2): qty 4

## 2022-08-02 MED ORDER — ONDANSETRON HCL 4 MG/2ML IJ SOLN
4.0000 mg | Freq: Four times a day (QID) | INTRAMUSCULAR | Status: DC | PRN
  Administered 2022-08-11: 4 mg via INTRAVENOUS
  Filled 2022-08-02: qty 2

## 2022-08-02 MED ORDER — POLYETHYLENE GLYCOL 3350 17 G PO PACK: 17.0000 g | PACK | Freq: Every day | ORAL | Status: DC | PRN

## 2022-08-02 MED ORDER — ACETAMINOPHEN 650 MG RE SUPP: 650.0000 mg | Freq: Four times a day (QID) | RECTAL | Status: DC | PRN

## 2022-08-02 MED ORDER — VALACYCLOVIR HCL 500 MG PO TABS
1000.0000 mg | ORAL_TABLET | Freq: Two times a day (BID) | ORAL | Status: DC
  Administered 2022-08-02 – 2022-08-07 (×11): 1000 mg via ORAL
  Filled 2022-08-02 (×12): qty 2

## 2022-08-02 MED ORDER — SPIRONOLACTONE 12.5 MG HALF TABLET
12.5000 mg | ORAL_TABLET | Freq: Every day | ORAL | Status: DC
  Administered 2022-08-02 – 2022-08-14 (×13): 12.5 mg via ORAL
  Filled 2022-08-02 (×13): qty 1

## 2022-08-02 MED ORDER — ONDANSETRON HCL 4 MG PO TABS
4.0000 mg | ORAL_TABLET | Freq: Four times a day (QID) | ORAL | Status: DC | PRN
  Filled 2022-08-02: qty 1

## 2022-08-02 MED ORDER — INSULIN ASPART 100 UNIT/ML IJ SOLN
0.0000 [IU] | Freq: Three times a day (TID) | INTRAMUSCULAR | Status: DC
  Administered 2022-08-02 – 2022-08-03 (×2): 5 [IU] via SUBCUTANEOUS
  Administered 2022-08-03: 8 [IU] via SUBCUTANEOUS
  Administered 2022-08-04 (×2): 5 [IU] via SUBCUTANEOUS
  Administered 2022-08-05: 3 [IU] via SUBCUTANEOUS
  Administered 2022-08-05: 15 [IU] via SUBCUTANEOUS
  Administered 2022-08-06: 3 [IU] via SUBCUTANEOUS
  Administered 2022-08-06: 8 [IU] via SUBCUTANEOUS
  Administered 2022-08-06 – 2022-08-07 (×2): 5 [IU] via SUBCUTANEOUS
  Administered 2022-08-07: 8 [IU] via SUBCUTANEOUS
  Administered 2022-08-07: 5 [IU] via SUBCUTANEOUS
  Administered 2022-08-08: 8 [IU] via SUBCUTANEOUS
  Administered 2022-08-08: 2 [IU] via SUBCUTANEOUS
  Administered 2022-08-08: 5 [IU] via SUBCUTANEOUS
  Administered 2022-08-09: 3 [IU] via SUBCUTANEOUS
  Administered 2022-08-09 (×2): 5 [IU] via SUBCUTANEOUS
  Administered 2022-08-10 (×2): 3 [IU] via SUBCUTANEOUS
  Administered 2022-08-10: 5 [IU] via SUBCUTANEOUS
  Administered 2022-08-11: 3 [IU] via SUBCUTANEOUS
  Administered 2022-08-11: 5 [IU] via SUBCUTANEOUS
  Administered 2022-08-11: 8 [IU] via SUBCUTANEOUS
  Administered 2022-08-12: 3 [IU] via SUBCUTANEOUS
  Administered 2022-08-12: 5 [IU] via SUBCUTANEOUS
  Administered 2022-08-12: 3 [IU] via SUBCUTANEOUS
  Administered 2022-08-13: 5 [IU] via SUBCUTANEOUS
  Administered 2022-08-13: 2 [IU] via SUBCUTANEOUS
  Administered 2022-08-13: 3 [IU] via SUBCUTANEOUS
  Administered 2022-08-14: 8 [IU] via SUBCUTANEOUS
  Filled 2022-08-02: qty 0.15

## 2022-08-02 MED ORDER — ACETAMINOPHEN 325 MG PO TABS
650.0000 mg | ORAL_TABLET | Freq: Four times a day (QID) | ORAL | Status: DC | PRN
  Administered 2022-08-05 – 2022-08-12 (×3): 650 mg via ORAL
  Filled 2022-08-02 (×4): qty 2

## 2022-08-02 MED ORDER — ALBUTEROL SULFATE (2.5 MG/3ML) 0.083% IN NEBU
2.5000 mg | INHALATION_SOLUTION | RESPIRATORY_TRACT | Status: DC | PRN
  Filled 2022-08-02: qty 3

## 2022-08-02 MED ORDER — ESCITALOPRAM OXALATE 10 MG PO TABS
10.0000 mg | ORAL_TABLET | Freq: Every day | ORAL | Status: DC
  Administered 2022-08-02 – 2022-08-14 (×13): 10 mg via ORAL
  Filled 2022-08-02 (×13): qty 1

## 2022-08-02 MED ORDER — APIXABAN 5 MG PO TABS
5.0000 mg | ORAL_TABLET | Freq: Two times a day (BID) | ORAL | Status: DC
  Administered 2022-08-02 – 2022-08-14 (×25): 5 mg via ORAL
  Filled 2022-08-02 (×26): qty 1

## 2022-08-02 MED ORDER — QUETIAPINE FUMARATE 50 MG PO TABS
75.0000 mg | ORAL_TABLET | Freq: Every day | ORAL | Status: DC
  Administered 2022-08-02 – 2022-08-13 (×12): 75 mg via ORAL
  Filled 2022-08-02 (×12): qty 1

## 2022-08-02 MED ORDER — POTASSIUM CHLORIDE CRYS ER 20 MEQ PO TBCR: 40.0000 meq | EXTENDED_RELEASE_TABLET | Freq: Once | ORAL | Status: DC

## 2022-08-02 MED ORDER — DOCUSATE SODIUM 100 MG PO CAPS
100.0000 mg | ORAL_CAPSULE | Freq: Two times a day (BID) | ORAL | Status: DC
  Administered 2022-08-02 – 2022-08-13 (×17): 100 mg via ORAL
  Filled 2022-08-02 (×19): qty 1

## 2022-08-02 MED ORDER — FUROSEMIDE 40 MG PO TABS
40.0000 mg | ORAL_TABLET | Freq: Two times a day (BID) | ORAL | Status: DC
  Filled 2022-08-02: qty 1

## 2022-08-02 MED ORDER — INSULIN ASPART 100 UNIT/ML IJ SOLN
0.0000 [IU] | Freq: Every day | INTRAMUSCULAR | Status: DC
  Administered 2022-08-13: 2 [IU] via SUBCUTANEOUS
  Filled 2022-08-02: qty 0.05

## 2022-08-02 MED ORDER — QUETIAPINE FUMARATE 50 MG PO TABS: 50.0000 mg | ORAL_TABLET | Freq: Every day | ORAL | Status: DC

## 2022-08-02 MED ORDER — DONEPEZIL HCL 5 MG PO TABS: 5.0000 mg | ORAL_TABLET | Freq: Every day | ORAL | Status: DC

## 2022-08-02 MED ORDER — HALOPERIDOL 1 MG PO TABS
2.0000 mg | ORAL_TABLET | Freq: Once | ORAL | Status: AC
  Administered 2022-08-02: 2 mg via ORAL
  Filled 2022-08-02: qty 2

## 2022-08-02 MED ORDER — IPRATROPIUM-ALBUTEROL 0.5-2.5 (3) MG/3ML IN SOLN
3.0000 mL | Freq: Four times a day (QID) | RESPIRATORY_TRACT | Status: DC | PRN
  Administered 2022-08-11: 3 mL via RESPIRATORY_TRACT
  Filled 2022-08-02: qty 3

## 2022-08-02 MED ORDER — DIVALPROEX SODIUM 125 MG PO DR TAB: 125.0000 mg | DELAYED_RELEASE_TABLET | Freq: Two times a day (BID) | ORAL | Status: DC

## 2022-08-02 MED ORDER — METOPROLOL SUCCINATE ER 25 MG PO TB24
12.5000 mg | ORAL_TABLET | Freq: Every day | ORAL | Status: DC
  Administered 2022-08-02 – 2022-08-14 (×13): 12.5 mg via ORAL
  Filled 2022-08-02 (×13): qty 1

## 2022-08-02 MED ORDER — TRAZODONE HCL 50 MG PO TABS
25.0000 mg | ORAL_TABLET | Freq: Every evening | ORAL | Status: DC | PRN
  Administered 2022-08-02 – 2022-08-13 (×11): 25 mg via ORAL
  Filled 2022-08-02 (×11): qty 1

## 2022-08-02 NOTE — ED Provider Notes (Addendum)
Patient with hx dementia and chf. Has been boarding in ED for past day. Yesterday his BNP was high and vascular congestion on xray. Unfortunately, it appears in shifting patient back and forth from his SNF to ED, it seems pt not regularly receiving meds, or extra dose of meds as recommended.   Pt with sob. Room air pulse ox 88%. No chest pain. Basilar rales. Prior ECHO w poor EF at baseline.  Will repeat bmet, bnp, and pcxr.  Lasix dose iv. Given chf, hypoxia/sob, will consult hospitalists for admission. Will also consult cardiology to see as patient missed cardiology appt from today.  From dementia with episodic/periodic behavioral challenges, patient remains calm and cooperative (as he did throughout ED stay yesterday). BH team has cleared from psych perspective and has made medication adjustment recommendations (which is appreciated).     Cathren Laine, MD 08/02/22 1316   Discussed pt with hospitalist - discussed pt, will admit.   Still awaiting cardiology consult callback - will re-consult/re-page.    Cathren Laine, MD 08/02/22 1434   Cardiology Rosann Auerbach) called back, discussed pt - they indicate they will see patient first thing in AM tomorrow.      Cathren Laine, MD 08/02/22 [phone]

## 2022-08-02 NOTE — H&P (Addendum)
History and Physical  NEEV MCBURNIE ZOX:[id number] DOB: 1950/08/30 DOA: 08/02/2022  PCP: Evlyn Kanner, MD   Chief Complaint: CHF and Dementia   HPI: Mark Cabrera is a 72 y.o. 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  History is provided by my discussion with the patient's daughter and healthcare power of attorney [name], as well as examination of the medical record including his multiple ER visits in the last 48 hours.  Since he initially was brought to the ER on 5/21 from his nursing home with complaints of behavioral disturbance, he was found to have elevated BNP, was given some Lasix, noted to have normal behavior without any aggressive behavior and was sent back to his nursing home.  Unfortunately was sent back from his nursing home twice after that, with complaints that he was behaving aggressively.  It seems like he did not receive any of his scheduled medications due to shuttling back-and-forth from the emergency department.  It is notable that the patient was labeled to be behaving aggressively, he has boarded here in the emergency department overnight since 7 PM yesterday and has been pleasant and cooperative.  Concerningly, EMS was reportedly told yesterday when he left his nursing home by the RN that "I do not care what you do with him, you can dump him in the left [employer] parking lot for all I care."   [name] also contacted patient's neurologist Dr. Teresa Coombs who recommends discontinue the patient's Aricept, as there has been some concern of aggressive behaviors since starting the medication.  [name] states that from what she has seen, he seems to get very anxious and almost have panic attacks.  He does use following which at times when he gets  frustrated at the facility, but she has never seen or heard him to be physically aggressive with anybody.  In the emergency department, patient has been calm and cooperative, he has been hemodynamically stable but noted to be saturating about 88% on room air and was placed on some oxygen.  He was given a dose of IV Lasix, was given a dose of oral potassium yesterday prior to discharge from the emergency department.  Updated labs are currently pending.  Review of Systems: Please see HPI for pertinent positives and negatives. A complete 10 system review of systems are otherwise negative.  Past Medical History:  Diagnosis Date   Acute diastolic congestive heart failure (HCC)    AKI (acute kidney injury) (HCC)    Aortic valve stenosis s/p AVR 2018   Echo 2/22: Poor acoustic windows, EF 50-55, mild LVH, normal RVSF, mild MR, trivial AI, no AS, no pericardial effusion   Atrial fibrillation (HCC) - post-op CABG    04/2016 CHA2DS2VAS score = 5   Atypical nevi    Coronary artery disease s/p 2 vessel CABG    Depression    "years ago"   Diabetes mellitus    Dyspnea    in the past    GERD (gastroesophageal reflux disease)    Heart murmur    Hepatic cirrhosis (HCC) 06/29/2017   History of kidney stones    Hyperlipidemia    hx of transaminitis secondary to statin and he has decided not to use statins secondary to potential side effects.   Hypertension    Nephrolithiasis  Osteoarthritis, knee    Pericardial effusion a. subxiphoid pericardial window on 04/21/2016 04/28/2016   Peripheral neuropathic pain 05/12/2016   Sleep apnea     Central apnea. 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 MRI of the brain 2007 showed acute left lateral medullary infarct in the distribution of left posterior inferior cerebral artery , narrowing of the left vertebral with severe  diminution of flow or acute occlusion. 2-D echo was normal no embolic source found.   Past Surgical History:  Procedure Laterality Date   AORTIC VALVE REPLACEMENT N/A 03/30/2016   Procedure: AORTIC VALVE REPLACEMENT (AVR);  Surgeon: Loreli Slot, MD;  Location: [facility] OR;  Service: Open Heart Surgery;  Laterality: N/A;   CARDIAC CATHETERIZATION N/A 03/15/2016   Procedure: Right/Left Heart Cath and Coronary Angiography;  Surgeon: Kathleene Hazel, MD;  Location[facility] INVASIVE CV LAB;  Service: Cardiovascular;  Laterality: N/A;   CLIPPING OF ATRIAL APPENDAGE N/A 03/30/2016   Procedure: CLIPPING OF LEFT ATRIAL APPENDAGE;  Surgeon: Loreli Slot, MD;  Location: [facility] OR;  Service: Open Heart Surgery;  Laterality: N/A;   CORONARY ARTERY BYPASS GRAFT N/A 03/30/2016   Procedure: CORONARY ARTERY BYPASS GRAFTING (CABG) Times Two;  Surgeon: Loreli Slot, MD;  Location: [facility] OR;  Service: Open Heart Surgery;  Laterality: N/A;   IR THORACENTESIS ASP PLEURAL SPACE W/IMG GUIDE  02/28/2022   IR THORACENTESIS ASP PLEURAL SPACE W/IMG GUIDE  03/02/2022   KNEE ARTHROSCOPY W/ PARTIAL MEDIAL MENISCECTOMY  05/12/2005   right, performed by Dr. Madelon Lips for torn medial meniscus.   ROTATOR CUFF REPAIR Right 2016   STERNAL WIRES REMOVAL N/A 08/13/2017   Procedure: STERNAL WIRES REMOVAL;  Surgeon: Loreli Slot, MD;  Location: [facility] OR;  Service: Thoracic;  Laterality: N/A;   SUBXYPHOID PERICARDIAL WINDOW N/A 04/21/2016   Procedure: SUBXYPHOID PERICARDIAL WINDOW;  Surgeon: Loreli Slot, MD;  Location: [facility] OR;  Service: Thoracic;  Laterality: N/A;   TEE WITHOUT CARDIOVERSION N/A 03/30/2016   Procedure: TRANSESOPHAGEAL ECHOCARDIOGRAM (TEE);  Surgeon: Loreli Slot, MD;  Location[facility] OR;  Service: Open Heart Surgery;  Laterality: N/A;   TEE WITHOUT CARDIOVERSION N/A 04/21/2016   Procedure: TRANSESOPHAGEAL ECHOCARDIOGRAM (TEE);  Surgeon: Loreli Slot, MD;  Location: [facility] OR;  Service:  Thoracic;  Laterality: N/A;    Social History:  reports that he has never smoked. He has never used smokeless tobacco. He reports current alcohol use of about 1.0 standard drink of alcohol per week. He reports that he does not use drugs.   Allergies  Allergen Reactions   Morpholine Salicylate Other (See Comments)    Hallucinations   Penicillins Other (See Comments)    Allergic- reaction?? Has patient had a PCN reaction causing immediate rash, facial/tongue/throat swelling, SOB or lightheadedness with hypotension: Unk Has patient had a PCN reaction causing severe rash involving mucus membranes or skin necrosis: Unk Has patient had a PCN reaction that required hospitalization: Unk Has patient had a PCN reaction occurring within the last 10 years: No If all of the above answers are "NO", then may proceed with Cephalosporin use.   Morphine And Codeine Other (See Comments)    Hallucinations    Sglt2 Inhibitors Rash and Other (See Comments)    Candidiasis infection prone   Statins Rash and Other (See Comments)    Severe rash  and back pain, and muscle pain    Family History  Problem Relation Age of Onset   Hypertension Mother    Diabetes Mother    Alcohol abuse Father      Prior to Admission medications   Medication Sig Start Date End Date Taking? Authorizing Provider  apixaban (ELIQUIS) 5 MG TABS tablet Take 1 tablet by mouth twice daily Patient taking differently: Take 5 mg by mouth 2 (two) times daily. 12/08/20  Yes Kathleene Hazel, MD  busPIRone (BUSPAR) 7.5 MG tablet Take 7.5 mg by mouth 2 (two) times daily.   Yes [provider]  clotrimazole (LOTRIMIN) 1 % cream Apply topically. 07/31/22  Yes [provider]  divalproex (DEPAKOTE) 125 MG DR tablet Take 125 mg by mouth 2 (two) times daily. 07/25/22  Yes [provider]  escitalopram (LEXAPRO) 10 MG tablet Take 1 tablet (10 mg total) by mouth daily. 12/02/20  Yes Christian, Rylee, MD  Evolocumab  (REPATHA) 140 MG/ML SOSY Inject 140 mg into the skin. Every 14 days   Yes [provider]  fluconazole (DIFLUCAN) 150 MG tablet Take 150 mg by mouth every 3 (three) days. 07/31/22  Yes [provider]  furosemide (LASIX) 40 MG tablet Take 40 mg by mouth 2 (two) times daily.   Yes [provider]  insulin aspart (NOVOLOG FLEXPEN) 100 UNIT/ML FlexPen INJECT 5 UNITS UNDER THE SKIN THREE TIMES DAILY WITH MEALS Patient taking differently: Inject 5 Units into the skin 3 (three) times daily with meals. 03/03/22  Yes Willette Cluster, MD  insulin glargine-yfgn [facility]) 100 UNIT/ML injection Inject 0.26 mLs (26 Units total) into the skin at bedtime. Patient taking differently: Inject 34 Units into the skin at bedtime. 03/03/22  Yes Willette Cluster, MD  ipratropium-albuterol (DUONEB) 0.5-2.5 (3) MG/3ML SOLN Inhale 3 mLs into the lungs every 6 (six) hours as needed (for shortness of breath).   Yes [provider]  levofloxacin (LEVAQUIN) 500 MG tablet Take 500 mg by mouth daily. 07/25/22  Yes [provider]  metFORMIN (GLUCOPHAGE) 500 MG tablet Take 1 tablet (500 mg total) by mouth 2 (two) times daily with a meal. 12/30/20 03/19/23 Yes Amponsah, Flossie Buffy, MD  metoprolol succinate (TOPROL-XL) 25 MG 24 hr tablet Take 0.5 tablets (12.5 mg total) by mouth daily. 01/26/22 08/02/22 Yes Adron Bene, MD  polyethylene glycol (MIRALAX / GLYCOLAX) 17 g packet Take 17 g by mouth daily. 09/29/21  Yes Morene Crocker, MD  potassium chloride SA (KLOR-CON M) 20 MEQ tablet One po bid x 3 days, then one po once a day 08/01/22  Yes Cathren Laine, MD  spironolactone (ALDACTONE) 25 MG tablet Take 0.5 tablets (12.5 mg total) by mouth daily. 03/04/22  Yes Willette Cluster, MD  valACYclovir (VALTREX) 1000 MG tablet Take 1,000 mg by mouth 2 (two) times daily.   Yes [provider]  vitamin B-12 (CYANOCOBALAMIN) 100 MCG tablet Take 100 mcg by mouth daily.   Yes [provider]   Accu-Chek Softclix Lancets lancets Use as instructed Patient not taking: Reported on 08/02/2022 12/03/20   Dolan Amen, MD  acetaminophen (TYLENOL) 325 MG tablet Take 2 tablets (650 mg total) by mouth every 4 (four) hours as needed for headache or mild pain. Patient not taking: Reported on 08/02/2022 04/28/16   Leone Brand, NP  Alirocumab (PRALUENT) 75 MG/ML SOAJ Inject 1 Pen into the skin every 14 (fourteen) days. Patient not taking: Reported on 08/02/2022 03/21/22   Kathleene Hazel, MD  donepezil (  ARICEPT) 5 MG tablet Take 1 tablet (5 mg total) by mouth at bedtime. Patient not taking: Reported on 08/02/2022 05/29/22   Windell Norfolk, MD  glucose blood (ACCU-CHEK GUIDE) test strip Use as instructed Patient not taking: Reported on 08/02/2022 12/03/20   Dolan Amen, MD  Insulin Pen Needle 31G X 5 MM MISC Use one pen needle three times daily. Dx E11.49 Patient not taking: Reported on 08/02/2022 09/14/21   Evlyn Kanner, MD  QUEtiapine (SEROQUEL) 50 MG tablet Take 1 tablet (50 mg total) by mouth at bedtime. Patient not taking: Reported on 08/02/2022 08/01/22   Cathren Laine, MD    Physical Exam: BP (!) 117/95 (BP Location: Left Arm)   Pulse 80   Temp 98.1 F (36.7 C) (Oral)   Resp 18   Ht 5\' 8"  (1.727 m)   Wt 85.2 kg   SpO2 100%   BMI 28.56 kg/m   General: Patient resting comfortably in the ER, currently on 2 L nasal cannula oxygen.  He is lying completely flat, not tachypneic, not short of breath.  He is alert, oriented to self but not to year, or location.  He is able to tell me that it is springtime.  He admits to having a poor memory and having hard time answering questions about things that happened previously. Eyes: EOMI, clear conjuctivae, white sclerea Neck: supple, no masses, trachea mildline  Cardiovascular: RRR, no murmurs or rubs, no peripheral edema  Respiratory: clear to auscultation bilaterally, no wheezes, no crackles  Abdomen: soft, nontender, nondistended,  normal bowel tones heard  Skin: dry, no rashes  Musculoskeletal: no joint effusions, normal range of motion  Psychiatric: appropriate affect, normal speech  Neurologic: extraocular muscles intact, clear speech, moving all extremities with intact sensorium          Labs on Admission:  Basic Metabolic Panel: Recent Labs  Lab 07/28/22 2210 08/01/22 1930  NA 132* 135  K 3.0* 3.1*  CL 100 104  CO2 18* 18*  GLUCOSE 260* 188*  BUN 23 28*  CREATININE 1.24 1.09  CALCIUM 8.5* 8.3*   Liver Function Tests: Recent Labs  Lab 07/28/22 2210 08/01/22 1930  AST 22 22  ALT 14 14  ALKPHOS 75 77  BILITOT 0.8 0.9  PROT 7.7 7.3  ALBUMIN 3.4* 3.3*   No results for input(s): "LIPASE", "AMYLASE" in the last 168 hours. No results for input(s): "AMMONIA" in the last 168 hours. CBC: Recent Labs  Lab 07/28/22 2210 08/01/22 1930  WBC 3.4* 2.9*  NEUTROABS 2.4  --   HGB 10.0* 9.9*  HCT 30.2* 30.7*  MCV 97.4 100.0  PLT 338 225   Cardiac Enzymes: No results for input(s): "CKTOTAL", "CKMB", "CKMBINDEX", "TROPONINI" in the last 168 hours.  BNP (last 3 results) Recent Labs    02/26/22 1229 07/28/22 2210 08/01/22 1930  BNP 373.4* 2,006.3* 1,518.8*    ProBNP (last 3 results) No results for input(s): "PROBNP" in the last 8760 hours.  CBG: Recent Labs  Lab 07/28/22 2122  GLUCAP 278*    Radiological Exams on Admission: DG Chest 2 View  Result Date: 08/01/2022 CLINICAL DATA:  CHF.  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 Stable enlarged heart with some vascular congestion. Small left effusion. No pneumothorax or consolidation. Film is under penetrated. IMPRESSION: Enlarged heart with vascular congestion. Prosthetic valve and atrial occlusion clip. Small left effusion. Electronically Signed   By: Karen Kays  M.D.   On: 08/01/2022 19:08    Assessment/Plan  Acute on chronic combined  systolic and diastolic CHF (congestive heart failure) (HCC)-clinically the patient appears to be euvolemic, he has no orthopnea, or lower extremity edema.  His BNP is certainly elevated, and he is borderline hypoxic. -Observation admission with telemetry -Received IV Lasix in the emergency department -EDP has requested inpatient cardiology consultation -Continue appropriate cardiac medications, consider continued IV Lasix diuresis pending clinical response  Hypokalemia -seen on yesterday's labs, was also given a dose of oral potassium yesterday prior to discharge from the ER.  Await repeat potassium level today, replete as necessary especially in the face of continued IV diuresis    Paroxysmal atrial fibrillation (HCC)-continue Eliquis    Dementia with behavioral disturbance (HCC)-will continue BuSpar, Lexapro, Seroquel.  Have discontinued Aricept per recommendation of his neurologist (see recent communication).  I think he would really benefit from being in a memory care unit.  TOC consulted.  DVT prophylaxis: Eliquis     Code Status: DNR, confirmed over the phone at the time of admission with the patient's daughter and healthcare power of attorney [name].  Consults called: Cardiology  Admission status: Observation   Time spent: 48 minutes  Francely Craw Sharlette Dense MD [facility] Pager [phone]  If 7PM-7AM, please contact night-coverage [email or web address] Password [facility]  08/02/2022, 2:37 PM

## 2022-08-02 NOTE — Telephone Encounter (Signed)
Called and spoke to pts daughter who stated that the pt has been going back and forth from skilled nursing home to hospital making sounds like he can't breathe. They stated that after stated that after starting aricept he seems aggressive, having panic attacks, walks into hallway screaming for help, threatening others, lethargic. The daughter wanted to know if the neurologist had any recommendations regarding these behaviors. He is currently in [place] and has been taken back and forth 3 times in the past 3 days.

## 2022-08-02 NOTE — Telephone Encounter (Signed)
I called and spoke w Jodi Mourning (DPR)  the patient is currently admitted.  She wanted to talk w someone about his symptoms of CHF.  She said it has never been this bad before.  We discussed that while he is in the ER, cardiology would be asked to come see the patient if it was needed.  I asked her to call the ER and and ask for someone to review how he is doing and the plan with her.  She is appreciative information provided.     She told me they are discussing releasing him from the ER today.  I adv we are unable to do a virtual visit with the patient while he is in the ER and he will need his appointment rescheduled.

## 2022-08-02 NOTE — Telephone Encounter (Signed)
Please advise them to discontinue Aricept.

## 2022-08-02 NOTE — Progress Notes (Signed)
Transition of Care [facility]) - Emergency Department Mini Assessment   Patient Details  Name: Mark Cabrera MRN: [id number] Date of Birth: [birth year]  [facility]) CM/SW Contact:    Princella Ion, LCSW Phone Number: 08/02/2022, 11:26 AM   Clinical Narrative: Pt presented to ED early morning after prior d/c yesterday evening. SNF, [facility] reports pt has been displaying aggressive behaviors. This CSW spoke to Alinda Money, Production designer, theatre/television/film, who informs the pt has had escalated behaviors over the past week. Alinda Money reports pt is in a room by himself due to them relocating previous roommate who was fearful of pt. Alinda Money stated "I want to be clear, we are not refusing to take him back but we need help managing his behaviors for the safety of him, other residents, and our staff." Alinda Money and [employer] requested that the pt be monitored overnight on medication adjustments made by psychiatry. This CSW staffed case with Psych NP who reports she will make medication adjustments.  This CSW spoke to pts daughter, Ryot Cornelison, who informed that the pt's sister reported she found medications in his room that he had not taken. [name] reports she has spoken to facility about this but they informed her that they are unable to force him to take meds. [name] also expressed concerns about pt's BNP and states he had a cardiologist appt today but could not attend due to being in the ED. This CSW has staffed with EDP who has agreed to contact pt's daughter to address her concerns. [name] also reported that the Child psychotherapist at [employer] is currently searching for Memory Care placement for the pt.   Pt will remain in ED overnight to be monitored on medication adjustments. TOC to outreach to Alinda Money, Production designer, theatre/television/film, to inform of plan.    ED Mini Assessment: What brought you to the Emergency Department? : aggressive behavior at SNF  Barriers to Discharge: No Barriers Identified     Means of departure: Not know       Patient  Contact and Communications Key Contact 1: Rhett, Delhoyo (Daughter) [phone] Key Contact 2: Alinda Money - Administrator at [employer] with: Alinda Money and clinical coordinator, Crystal... daughter, Jaquaveon Vanos Contact Date: 08/02/22,   Contact time: 1300 Contact Phone Number: [phone], Alinda Money Call outcome: SNF is requesting pt be monitored on meds before returning         Admission diagnosis:  Combative Patient Active Problem List   Diagnosis Date Noted   Palliative care encounter 04/08/2022   Hypomagnesemia 04/08/2022   Aggressive behavior 03/18/2022   Acute exacerbation of CHF (congestive heart failure) (HCC) 02/28/2022   Acute venous stasis dermatitis of both lower extremities 02/27/2022   Acute on chronic diastolic heart failure with preserved ejection fraction (HCC) 02/26/2022   Pressure injury of skin 01/24/2022   Hyperglycemia 01/17/2022   Neutropenia (HCC)    Fever, unspecified    Malnutrition of moderate degree 09/23/2021   Physical deconditioning 09/21/2021   Otitis externa 12/31/2020   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   Balanitis 11/19/2020   Complicated urinary tract infection    Acute kidney injury (HCC)    Thrombocytopenia (HCC)    Hyponatremia 11/08/2020   Left shoulder pain 10/20/2020   Dyspnea 09/23/2020   Cognitive impairment with history of delirium 08/25/2020   Hip pain, chronic, left 07/05/2020   Epididymitis 04/14/2020   Fatigue 03/18/2020   Hypotension 03/10/2020   Abdominal wall bulge 12/03/2019   Dermoid inclusion cyst 08/07/2019   Chronic left shoulder pain 05/13/2019   Depression 11/05/2018  Blurry vision, bilateral 09/20/2018   CVA (cerebral vascular accident) (HCC) 06/28/2018   Hepatic cirrhosis (HCC) 06/29/2017   Healthcare maintenance 12/16/2016   Lipoma of left upper extremity 09/12/2016   Chronic knee pain 07/04/2016   Decreased visual acuity 07/04/2016   Pleural effusion, left 04/28/2016   Paroxysmal atrial fibrillation (HCC) 04/16/2016    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/31/2016   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   CAD (coronary artery disease), native coronary artery    Chronic diastolic heart failure (HCC)    Sleep apnea 08/02/2009   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

## 2022-08-02 NOTE — Consult Note (Signed)
[facility] Face-to-Face Psychiatry Consult   Reason for Consult: Aggressive behavior Referring Physician:  MD Molpus  Patient Identification: Mark Cabrera MRN:  [id number] Principal Diagnosis: Aggressive behavior Diagnosis:  Principal Problem:   Aggressive behavior   Total Time spent with patient: 15 minutes  Subjective:   Mark Cabrera is a 72 y.o. male presents from [facility] facility due to aggressive behavior.  It was reported that this is patient's third visit to the local emergency department for similar behaviors. Patient has a charted history with traumatic brain injury, dementia and aggressive behavior.  On this admission it was reported that patient was no longer aggressive or combative prior to seeking emergency medical services arrival.  Mark Cabrera was seen and evaluated face-to-face by this provider.  He is alert,  oriented to self and place.  He is denying suicidal or homicidal ideations.  Denies auditory or visual hallucinations.  Unsure the reason for his admission.  NP attempted to follow-up with nursing home staff.  Awaiting a callback from nurse staff RN Mark Cabrera  to address additional concerns.  This provider spoke to patient's daughter Mark Cabrera at [phone] as charted as patient's legal guardian.  She reports aggressive behaviors are occasional however concerned that patient will not be accepted back to this facility.  Discussed medication adjustments recommendations.   Consider increasing Seroquel 50 mg p.o. nightly to 75 mg p.o. nightly Consider increasing Depakote 125 mg twice daily to 250 mg po BID Chart reviewed CMP:  BUN 28 an d Creatinine 1.9- Pending Valproic Level.  Mark Cabrera continues to report concerns with BNP-defer medical questions to emergency room providers.  She was receptive to plan Continue Lexapro, BuSpar and Aricept as directed. Case staffed with attending psychiatrist MD Lucianne Muss.  Patient to be psychiatrically cleared.  CSW to follow-up  with additional outpatient resources for focused care to include a Memory care unit and continue outpatient follow-up appointments with neurology.    HPI:  Per admission assessment note:- "Pt with hx dementia with behavioral issues, presents from SNF via EMS w ivc. IVC indicates pt has been uncooperative at times, and at other times makes threatening statements. EMS and [facility] report that patient has been calm and cooperative w them. Pt is a limited historian, dementia. Pt indicates in general he feels fine. Indicates normal appetite. He indicates in general he gets along fine with residents and staff at facility, and indicates occasionally they get upset with him when he doesn't act how they want. Pt indicates occasional irritation to skin of scrotal area - no acute worsening. No dysuria or hematuria. No fever or chills. No report of trauma/fall."    Past Psychiatric History:   Risk to Self:   Risk to Others:   Prior Inpatient Therapy:   Prior Outpatient Therapy:    Past Medical History:  Past Medical History:  Diagnosis Date   Acute diastolic congestive heart failure (HCC)    AKI (acute kidney injury) (HCC)    Aortic valve stenosis s/p AVR 2018   Echo 2/22: Poor acoustic windows, EF 50-55, mild LVH, normal RVSF, mild MR, trivial AI, no AS, no pericardial effusion   Atrial fibrillation (HCC) - post-op CABG    04/2016 CHA2DS2VAS score = 5   Atypical nevi    Coronary artery disease s/p 2 vessel CABG    Depression    "years ago"   Diabetes mellitus    Dyspnea    in the past    GERD (gastroesophageal reflux disease)  Heart murmur    Hepatic cirrhosis (HCC) 06/29/2017   History of kidney stones    Hyperlipidemia    hx of transaminitis secondary to statin and he has decided not to use statins secondary to potential side effects.   Hypertension    Nephrolithiasis    Osteoarthritis, knee    Pericardial effusion a. subxiphoid pericardial window on 04/21/2016 04/28/2016   Peripheral  neuropathic pain 05/12/2016   Sleep apnea     Central apnea. 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 2-D echo was normal no embolic source found.    Past Surgical History:  Procedure Laterality Date   AORTIC VALVE REPLACEMENT N/A 03/30/2016   Procedure: AORTIC VALVE REPLACEMENT (AVR);  Surgeon: Loreli Slot, MD;  Location: [facility] OR;  Service: Open Heart Surgery;  Laterality: N/A;   CARDIAC CATHETERIZATION N/A 03/15/2016   Procedure: Right/Left Heart Cath and Coronary Angiography;  Surgeon: Kathleene Hazel, MD;  Location: [facility] INVASIVE CV LAB;  Service: Cardiovascular;  Laterality: N/A;   CLIPPING OF ATRIAL APPENDAGE N/A 03/30/2016   Procedure: CLIPPING OF LEFT ATRIAL APPENDAGE;  Surgeon: Loreli Slot, MD;  Location: [facility] OR;  Service: Open Heart Surgery;  Laterality: N/A;   CORONARY ARTERY BYPASS GRAFT N/A 03/30/2016   Procedure: CORONARY ARTERY BYPASS GRAFTING (CABG) Times Two;  Surgeon: Loreli Slot, MD;  Location: [facility] OR;  Service: Open Heart Surgery;  Laterality: N/A;   IR THORACENTESIS ASP PLEURAL SPACE W/IMG GUIDE  02/28/2022   IR THORACENTESIS ASP PLEURAL SPACE W/IMG GUIDE  03/02/2022   KNEE ARTHROSCOPY W/ PARTIAL MEDIAL MENISCECTOMY  05/12/2005   right, performed by Dr. Madelon Lips for torn medial meniscus.   ROTATOR CUFF REPAIR Right 2016   STERNAL WIRES REMOVAL N/A 08/13/2017   Procedure: STERNAL WIRES REMOVAL;  Surgeon: Loreli Slot, MD;  Location: [facility] OR;  Service: Thoracic;  Laterality: N/A;   SUBXYPHOID PERICARDIAL WINDOW N/A 04/21/2016   Procedure: SUBXYPHOID PERICARDIAL WINDOW;  Surgeon: Loreli Slot,  MD;  Location: [facility] OR;  Service: Thoracic;  Laterality: N/A;   TEE WITHOUT CARDIOVERSION N/A 03/30/2016   Procedure: TRANSESOPHAGEAL ECHOCARDIOGRAM (TEE);  Surgeon: Loreli Slot, MD;  Location: [facility] OR;  Service: Open Heart Surgery;  Laterality: N/A;   TEE WITHOUT CARDIOVERSION N/A 04/21/2016   Procedure: TRANSESOPHAGEAL ECHOCARDIOGRAM (TEE);  Surgeon: Loreli Slot, MD;  Location: [facility] OR;  Service: Thoracic;  Laterality: N/A;   Family History:  Family History  Problem Relation Age of Onset   Hypertension Mother    Diabetes Mother    Alcohol abuse Father    Family Psychiatric  History:  Social History:  Social History   Substance and Sexual Activity  Alcohol Use Yes   Alcohol/week: 1.0 standard drink of alcohol   Types: 1 Cans of beer per week   Comment: occasional     Social History   Substance and Sexual Activity  Drug Use No    Social History   Socioeconomic History   Marital status: Divorced    Spouse name: Not on file   Number of children: 1   Years of education: 2y college   Highest education  level: Not on file  Occupational History   Occupation:  Medicaid /disability    Employer: UNEMPLOYED    Comment: following stroke in 2007  Tobacco Use   Smoking status: Never   Smokeless tobacco: Never  Vaping Use   Vaping Use: Never used  Substance and Sexual Activity   Alcohol use: Yes    Alcohol/week: 1.0 standard drink of alcohol    Types: 1 Cans of beer per week    Comment: occasional   Drug use: No   Sexual activity: Not Currently  Other Topics Concern   Not on file  Social History Narrative   Single but has a girlfriend.    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       Patient has medications disability post stroke.            Social Determinants of Health   Financial Resource Strain: Medium Risk (01/18/2022)   Overall Financial Resource Strain (CARDIA)    Difficulty of Paying Living Expenses: Somewhat hard   Food Insecurity: No Food Insecurity (02/27/2022)   Hunger Vital Sign    Worried About Running Out of Food in the Last Year: Never true    Ran Out of Food in the Last Year: Never true  Recent Concern: Food Insecurity - Food Insecurity Present (01/21/2022)   Hunger Vital Sign    Worried About Running Out of Food in the Last Year: Sometimes true    Ran Out of Food in the Last Year: Sometimes true  Transportation Needs: Unmet Transportation Needs (02/27/2022)   PRAPARE - Administrator, Civil Service (Medical): Yes    Lack of Transportation (Non-Medical): Yes  Physical Activity: Inactive (01/18/2022)   Exercise Vital Sign    Days of Exercise per Week: 0 days    Minutes of Exercise per Session: 0 min  Stress: Stress Concern Present (01/18/2022)   [employer] of Occupational Health - Occupational Stress Questionnaire    Feeling of Stress : Very much  Social Connections: Moderately Isolated (01/18/2022)   Social Connection and Isolation Panel [NHANES]    Frequency of Communication with Friends and Family: More than three times a week    Frequency of Social Gatherings with Friends and Family: Once a week    Attends Religious Services: 1 to 4 times per year    Active Member of [employer] or Organizations: No    Attends Banker Meetings: Never    Marital Status: Divorced   Additional Social History:    Allergies:   Allergies  Allergen Reactions   Morpholine Salicylate Other (See Comments)    Hallucinations   Penicillins Other (See Comments)    Allergic- reaction?? Has patient had a PCN reaction causing immediate rash, facial/tongue/throat swelling, SOB or lightheadedness with hypotension: Unk Has patient had a PCN reaction causing severe rash involving mucus membranes or skin necrosis: Unk Has patient had a PCN reaction that required hospitalization: Unk Has patient had a PCN reaction occurring within the last 10 years: No If all of the above answers are "NO",  then may proceed with Cephalosporin use.   Morphine And Codeine Other (See Comments)    Hallucinations    Sglt2 Inhibitors Rash and Other (See Comments)    Candidiasis infection prone   Statins Rash and Other (See Comments)    Severe rash and back pain, and muscle pain    Labs:  Results for orders placed or performed during the hospital encounter  of 08/01/22 (from the past 48 hour(s))  Comprehensive metabolic panel     Status: Abnormal   Collection Time: 08/01/22  7:30 PM  Result Value Ref Range   Sodium 135 135 - 145 mmol/L   Potassium 3.1 (L) 3.5 - 5.1 mmol/L   Chloride 104 98 - 111 mmol/L   CO2 18 (L) 22 - 32 mmol/L   Glucose, Bld 188 (H) 70 - 99 mg/dL    Comment: Glucose reference range applies only to samples taken after fasting for at least 8 hours.   BUN 28 (H) 8 - 23 mg/dL   Creatinine, Ser 1.61 0.61 - 1.24 mg/dL   Calcium 8.3 (L) 8.9 - 10.3 mg/dL   Total Protein 7.3 6.5 - 8.1 g/dL   Albumin 3.3 (L) 3.5 - 5.0 g/dL   AST 22 15 - 41 U/L   ALT 14 0 - 44 U/L   Alkaline Phosphatase 77 38 - 126 U/L   Total Bilirubin 0.9 0.3 - 1.2 mg/dL   GFR, Estimated >09 >60 mL/min    Comment: (NOTE) Calculated using the CKD-EPI Creatinine Equation (2021)    Anion gap 13 5 - 15    Comment: Performed at [facility], [address], [place], [place] [address]  CBC     Status: Abnormal   Collection Time: 08/01/22  7:30 PM  Result Value Ref Range   WBC 2.9 (L) 4.0 - 10.5 K/uL   RBC 3.07 (L) 4.22 - 5.81 MIL/uL   Hemoglobin 9.9 (L) 13.0 - 17.0 g/dL   HCT 81.1 (L) 91.4 - 78.2 %   MCV 100.0 80.0 - 100.0 fL   MCH 32.2 26.0 - 34.0 pg   MCHC 32.2 30.0 - 36.0 g/dL   RDW 95.6 (H) 21.3 - 08.6 %   Platelets 225 150 - 400 K/uL   nRBC 0.0 0.0 - 0.2 %    Comment: Performed at [facility], [address], [place], [place] [address]  Brain natriuretic peptide     Status: Abnormal   Collection Time: 08/01/22  7:30 PM  Result Value Ref Range   B Natriuretic  Peptide 1,518.8 (H) 0.0 - 100.0 pg/mL    Comment: Performed at [facility], [address] [place], [place] [address]    Current Facility-Administered Medications  Medication Dose Route Frequency Provider Last Rate Last Admin   haloperidol (HALDOL) tablet 2 mg  2 mg Oral Once Gloris Manchester, MD       Current Outpatient Medications  Medication Sig Dispense Refill   apixaban (ELIQUIS) 5 MG TABS tablet Take 1 tablet by mouth twice daily (Patient taking differently: Take 5 mg by mouth 2 (two) times daily.) 180 tablet 1   busPIRone (BUSPAR) 7.5 MG tablet Take 7.5 mg by mouth 2 (two) times daily.     clotrimazole (LOTRIMIN) 1 % cream Apply topically.     divalproex (DEPAKOTE) 125 MG DR tablet Take 125 mg by mouth 2 (two) times daily.     escitalopram (LEXAPRO) 10 MG tablet Take 1 tablet (10 mg total) by mouth daily. 90 tablet 0   Evolocumab (REPATHA) 140 MG/ML SOSY Inject 140 mg into the skin. Every 14 days     fluconazole (DIFLUCAN) 150 MG tablet Take 150 mg by mouth every 3 (three) days.     furosemide (LASIX) 40 MG tablet Take 40 mg by mouth 2 (two) times daily.     insulin aspart (NOVOLOG FLEXPEN) 100 UNIT/ML FlexPen INJECT 5 UNITS  UNDER THE SKIN THREE TIMES DAILY WITH MEALS (Patient taking differently: Inject 5 Units into the skin 3 (three) times daily with meals.) 45 mL 0   insulin glargine-yfgn (SEMGLEE) 100 UNIT/ML injection Inject 0.26 mLs (26 Units total) into the skin at bedtime. (Patient taking differently: Inject 34 Units into the skin at bedtime.) 10 mL 11   ipratropium-albuterol (DUONEB) 0.5-2.5 (3) MG/3ML SOLN Inhale 3 mLs into the lungs every 6 (six) hours as needed (for shortness of breath).     levofloxacin (LEVAQUIN) 500 MG tablet Take 500 mg by mouth daily.     metFORMIN (GLUCOPHAGE) 500 MG tablet Take 1 tablet (500 mg total) by mouth 2 (two) times daily with a meal. 90 tablet 3   metoprolol succinate (TOPROL-XL) 25 MG 24 hr tablet Take 0.5 tablets (12.5 mg  total) by mouth daily. 15 tablet 2   polyethylene glycol (MIRALAX / GLYCOLAX) 17 g packet Take 17 g by mouth daily. 14 each 0   potassium chloride SA (KLOR-CON M) 20 MEQ tablet One po bid x 3 days, then one po once a day 15 tablet 0   spironolactone (ALDACTONE) 25 MG tablet Take 0.5 tablets (12.5 mg total) by mouth daily.     valACYclovir (VALTREX) 1000 MG tablet Take 1,000 mg by mouth 2 (two) times daily.     vitamin B-12 (CYANOCOBALAMIN) 100 MCG tablet Take 100 mcg by mouth daily.     Accu-Chek Softclix Lancets lancets Use as instructed (Patient not taking: Reported on 08/02/2022) 100 each 12   acetaminophen (TYLENOL) 325 MG tablet Take 2 tablets (650 mg total) by mouth every 4 (four) hours as needed for headache or mild pain. (Patient not taking: Reported on 08/02/2022)     Alirocumab (PRALUENT) 75 MG/ML SOAJ Inject 1 Pen into the skin every 14 (fourteen) days. (Patient not taking: Reported on 08/02/2022) 2 mL 11   donepezil (ARICEPT) 5 MG tablet Take 1 tablet (5 mg total) by mouth at bedtime. (Patient not taking: Reported on 08/02/2022) 30 tablet 11   glucose blood (ACCU-CHEK GUIDE) test strip Use as instructed (Patient not taking: Reported on 08/02/2022) 100 each 12   Insulin Pen Needle 31G X 5 MM MISC Use one pen needle three times daily. Dx E11.49 (Patient not taking: Reported on 08/02/2022) 100 each 3   QUEtiapine (SEROQUEL) 50 MG tablet Take 1 tablet (50 mg total) by mouth at bedtime. (Patient not taking: Reported on 08/02/2022) 30 tablet 0    Musculoskeletal: Ambulation wasn't  witnessed.  Patient observed sitting in bed sitter at bedside    Psychiatric Specialty Exam:  Presentation  General Appearance:  Appropriate for Environment  Eye Contact: Good  Speech: Clear and Coherent  Speech Volume: Normal  Handedness: Right   Mood and Affect  Mood: Euthymic  Affect: Congruent   Thought Process  Thought Processes: Coherent  Descriptions of  Associations:Intact  Orientation:Full (Time, Place and Person)  Thought Content:Logical  History of Schizophrenia/Schizoaffective disorder:No data recorded Duration of Psychotic Symptoms:No data recorded Hallucinations:Hallucinations: None  Ideas of Reference:None  Suicidal Thoughts:Suicidal Thoughts: No  Homicidal Thoughts:Homicidal Thoughts: No   Sensorium  Memory: Immediate Poor; Remote Poor  Judgment: Poor  Insight: Poor   Executive Functions  Concentration: Poor  Attention Span: Fair  Recall: [employer] of Knowledge: Fair  Language: Fair   Psychomotor Activity  Psychomotor Activity: Psychomotor Activity: Normal   Assets  Assets: Social Support; Desire for Improvement   Sleep  Sleep: Sleep: Fair   Physical Exam:  Physical Exam Vitals and nursing note reviewed.  Cardiovascular:     Rate and Rhythm: Normal rate and regular rhythm.  Neurological:     Mental Status: He is alert. Mental status is at baseline.  Psychiatric:        Mood and Affect: Mood normal.        Behavior: Behavior normal.    Review of Systems  Psychiatric/Behavioral:  Negative for depression and suicidal ideas. The patient is not nervous/anxious.   All other systems reviewed and are negative.  Blood pressure 117/85, pulse 85, temperature (!) 97.5 F (36.4 C), temperature source Oral, resp. rate 19, height 5\' 8"  (1.727 m), weight 85.2 kg, SpO2 100 %. Body mass index is 28.56 kg/m.  Treatment Plan Summary: Daily contact with patient to assess and evaluate symptoms and progress in treatment and Medication management  See HPI for medication adjustments  Disposition: No evidence of imminent risk to self or others at present.   Patient does not meet criteria for psychiatric inpatient admission. Supportive therapy provided about ongoing stressors. Refer to IOP. Discussed crisis plan, support from social network, calling 911, coming to the Emergency Department, and  calling Suicide Hotline.  Oneta Rack, NP 08/02/2022 10:06 AM

## 2022-08-02 NOTE — ED Notes (Signed)
X-ray at bedside

## 2022-08-02 NOTE — ED Notes (Signed)
Pt is going to be seen for tts in person during dayshift

## 2022-08-02 NOTE — Telephone Encounter (Signed)
Done

## 2022-08-02 NOTE — Telephone Encounter (Signed)
Pt daughter called in asking to speak to nurse about pt's hospital stay. She wants to keep the appt today but make it virtual. Please advise.

## 2022-08-02 NOTE — BH Assessment (Signed)
TTS clinician attempted to complete TTS assessment. Per Marchelle Folks, RN, patient is not oriented. Patient to be seen by daytime psych provider at [facility].

## 2022-08-02 NOTE — ED Provider Notes (Addendum)
[facility]-EMERGENCY DEPT Provider Note: Mark Dell, MD, FACEP  CSN: [id number] MRN: [id number] ARRIVAL: 08/02/22 at 0205 ROOM: WA17/WA17   CHIEF COMPLAINT  Combative   HISTORY OF PRESENT ILLNESS  08/02/22 2:46 AM Mark Cabrera is a 72 y.o. male with a history of dementia who was sent from his nursing home for alleged combative behavior.  Seen in the ED by Dr. Denton Lank yesterday, deemed medically clear and safe to return to his nursing home.  His potassium was noted to be low and he was given 40 mEq of potassium chloride prior to discharge.  He was also given a dose of Lasix for elevated BNP consistent with congestive heart failure.  His note:  "Pt with hx dementia with behavioral issues, presents from SNF via EMS w ivc. IVC indicates pt has been uncooperative at times, and at other times makes threatening statements. EMS and [facility] report that patient has been calm and cooperative w them. Pt is a limited historian, dementia. Pt indicates in general he feels fine. Indicates normal appetite. He indicates in general he gets along fine with residents and staff at facility, and indicates occasionally they get upset with him when he doesn't act how they want. Pt indicates occasional irritation to skin of scrotal area - no acute worsening. No dysuria or hematuria. No fever or chills. No report of trauma/fall."   When the patient was returned to the nursing home staff initially refused to allow him into the door.  They eventually relented.  They subsequently reported combative behavior.  EMS was called to return him to the ED.  EMS found him to be naked but otherwise calm and cooperative.  He is calm and cooperative on arrival to the ED.  He has no complaints except the scrotal pain which was noted by Dr. Denton Lank.  The paramedic who brought him alleges the nursing home staff told her "I do not care what you do with him, you can dump him in the left [employer] parking lot for all I care."  The patient's daughter  was contacted and she would like him assessed for by TTS.  Past Medical History:  Diagnosis Date   Acute diastolic congestive heart failure (HCC)    AKI (acute kidney injury) (HCC)    Aortic valve stenosis s/p AVR 2018   Echo 2/22: Poor acoustic windows, EF 50-55, mild LVH, normal RVSF, mild MR, trivial AI, no AS, no pericardial effusion   Atrial fibrillation (HCC) - post-op CABG    04/2016 CHA2DS2VAS score = 5   Atypical nevi    Coronary artery disease s/p 2 vessel CABG    Depression    "years ago"   Diabetes mellitus    Dyspnea    in the past    GERD (gastroesophageal reflux disease)    Heart murmur    Hepatic cirrhosis (HCC) 06/29/2017   History of kidney stones    Hyperlipidemia    hx of transaminitis secondary to statin and he has decided not to use statins secondary to potential side effects.   Hypertension    Nephrolithiasis    Osteoarthritis, knee    Pericardial effusion a. subxiphoid pericardial window on 04/21/2016 04/28/2016   Peripheral neuropathic pain 05/12/2016   Sleep apnea     Central apnea. 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 MRI of  the brain 2007 showed acute left lateral medullary infarct in the distribution of left posterior inferior cerebral artery , narrowing of the left vertebral with severe diminution of flow or acute occlusion. 2-D echo was normal no embolic source found.    Past Surgical History:  Procedure Laterality Date   AORTIC VALVE REPLACEMENT N/A 03/30/2016   Procedure: AORTIC VALVE REPLACEMENT (AVR);  Surgeon: Loreli Slot, MD;  Location: [facility] OR;  Service: Open Heart Surgery;  Laterality: N/A;   CARDIAC CATHETERIZATION N/A 03/15/2016   Procedure: Right/Left Heart Cath and Coronary Angiography;  Surgeon: Kathleene Hazel, MD;  Location: [facility] INVASIVE CV LAB;  Service: Cardiovascular;  Laterality:  N/A;   CLIPPING OF ATRIAL APPENDAGE N/A 03/30/2016   Procedure: CLIPPING OF LEFT ATRIAL APPENDAGE;  Surgeon: Loreli Slot, MD;  Location: [facility] OR;  Service: Open Heart Surgery;  Laterality: N/A;   CORONARY ARTERY BYPASS GRAFT N/A 03/30/2016   Procedure: CORONARY ARTERY BYPASS GRAFTING (CABG) Times Two;  Surgeon: Loreli Slot, MD;  Location: [facility] OR;  Service: Open Heart Surgery;  Laterality: N/A;   IR THORACENTESIS ASP PLEURAL SPACE W/IMG GUIDE  02/28/2022   IR THORACENTESIS ASP PLEURAL SPACE W/IMG GUIDE  03/02/2022   KNEE ARTHROSCOPY W/ PARTIAL MEDIAL MENISCECTOMY  05/12/2005   right, performed by Dr. Madelon Lips for torn medial meniscus.   ROTATOR CUFF REPAIR Right 2016   STERNAL WIRES REMOVAL N/A 08/13/2017   Procedure: STERNAL WIRES REMOVAL;  Surgeon: Loreli Slot, MD;  Location: [facility] OR;  Service: Thoracic;  Laterality: N/A;   SUBXYPHOID PERICARDIAL WINDOW N/A 04/21/2016   Procedure: SUBXYPHOID PERICARDIAL WINDOW;  Surgeon: Loreli Slot, MD;  Location: [facility] OR;  Service: Thoracic;  Laterality: N/A;   TEE WITHOUT CARDIOVERSION N/A 03/30/2016   Procedure: TRANSESOPHAGEAL ECHOCARDIOGRAM (TEE);  Surgeon: Loreli Slot, MD;  Location: [facility] OR;  Service: Open Heart Surgery;  Laterality: N/A;   TEE WITHOUT CARDIOVERSION N/A 04/21/2016   Procedure: TRANSESOPHAGEAL ECHOCARDIOGRAM (TEE);  Surgeon: Loreli Slot, MD;  Location: [facility] OR;  Service: Thoracic;  Laterality: N/A;    Family History  Problem Relation Age of Onset   Hypertension Mother    Diabetes Mother    Alcohol abuse Father     Social History   Tobacco Use   Smoking status: Never   Smokeless tobacco: Never  Vaping Use   Vaping Use: Never used  Substance Use Topics   Alcohol use: Yes    Alcohol/week: 1.0 standard drink of alcohol    Types: 1 Cans of beer per week    Comment: occasional   Drug use: No    Prior to Admission medications   Medication Sig Start Date End Date Taking? Authorizing  Provider  clotrimazole (LOTRIMIN) 1 % cream Apply topically. 07/31/22  Yes [provider]  divalproex (DEPAKOTE) 125 MG DR tablet Take 125 mg by mouth 2 (two) times daily. 07/25/22  Yes [provider]  Evolocumab (REPATHA) 140 MG/ML SOSY Inject 140 mg into the skin. Every 14 days   Yes [provider]  fluconazole (DIFLUCAN) 150 MG tablet Take 150 mg by mouth every 3 (three) days. 07/31/22  Yes [provider]  LANTUS 100 UNIT/ML injection Inject into the skin. 06/19/22  Yes [provider]  levofloxacin (LEVAQUIN) 500 MG tablet Take 500 mg by mouth daily. 07/25/22  Yes [provider]  Accu-Chek Softclix Lancets lancets Use as instructed 12/03/20   Dolan Amen, MD  acetaminophen (TYLENOL) 325 MG tablet Take 2 tablets (  650 mg total) by mouth every 4 (four) hours as needed for headache or mild pain. 04/28/16   Leone Brand, NP  Alirocumab (PRALUENT) 75 MG/ML SOAJ Inject 1 Pen into the skin every 14 (fourteen) days. 03/21/22   Kathleene Hazel, MD  apixaban (ELIQUIS) 5 MG TABS tablet Take 1 tablet by mouth twice daily Patient taking differently: Take 5 mg by mouth 2 (two) times daily. 12/08/20   Kathleene Hazel, MD  benzocaine (ORAJEL) 10 % mucosal gel Use as directed 1 Application in the mouth or throat 4 (four) times daily as needed for mouth pain.    [provider]  donepezil (ARICEPT) 5 MG tablet Take 1 tablet (5 mg total) by mouth at bedtime. 05/29/22   Windell Norfolk, MD  escitalopram (LEXAPRO) 10 MG tablet Take 1 tablet (10 mg total) by mouth daily. 12/02/20   Elige Radon, MD  furosemide (LASIX) 40 MG tablet Take 40 mg by mouth 2 (two) times daily.    [provider]  glucose blood (ACCU-CHEK GUIDE) test strip Use as instructed 12/03/20   Dolan Amen, MD  insulin aspart (NOVOLOG FLEXPEN) 100 UNIT/ML FlexPen INJECT 5 UNITS UNDER THE SKIN THREE TIMES DAILY WITH MEALS 03/03/22   Willette Cluster, MD  insulin  glargine-yfgn (SEMGLEE) 100 UNIT/ML injection Inject 0.26 mLs (26 Units total) into the skin at bedtime. 03/03/22   Willette Cluster, MD  Insulin Pen Needle 31G X 5 MM MISC Use one pen needle three times daily. Dx E11.49 09/14/21   Evlyn Kanner, MD  ipratropium-albuterol (DUONEB) 0.5-2.5 (3) MG/3ML SOLN Inhale 3 mLs into the lungs every 6 (six) hours as needed (for shortness of breath).    [provider]  metFORMIN (GLUCOPHAGE) 500 MG tablet Take 1 tablet (500 mg total) by mouth 2 (two) times daily with a meal. 12/30/20 03/19/23  Steffanie Rainwater, MD  metoprolol succinate (TOPROL-XL) 25 MG 24 hr tablet Take 0.5 tablets (12.5 mg total) by mouth daily. 01/26/22 04/26/22  Adron Bene, MD  polyethylene glycol (MIRALAX / GLYCOLAX) 17 g packet Take 17 g by mouth daily. 09/29/21   Morene Crocker, MD  potassium chloride SA (KLOR-CON M) 20 MEQ tablet One po bid x 3 days, then one po once a day 08/01/22   Cathren Laine, MD  QUEtiapine (SEROQUEL) 50 MG tablet Take 1 tablet (50 mg total) by mouth at bedtime. 08/01/22   Cathren Laine, MD  spironolactone (ALDACTONE) 25 MG tablet Take 0.5 tablets (12.5 mg total) by mouth daily. 03/04/22   Willette Cluster, MD  valACYclovir (VALTREX) 1000 MG tablet Take 1,000 mg by mouth 2 (two) times daily.    [provider]  vitamin B-12 (CYANOCOBALAMIN) 100 MCG tablet Take 100 mcg by mouth daily.    [provider]    Allergies Morpholine salicylate, Penicillins, Morphine and codeine, Sglt2 inhibitors, and Statins   REVIEW OF SYSTEMS  Level 5 caveat   PHYSICAL EXAMINATION  Initial Vital Signs Blood pressure 115/86, pulse 76, temperature (!) 97.5 F (36.4 C), temperature source Oral, resp. rate 18, SpO2 100 %.  Examination General: Well-developed, well-nourished male in no acute distress; appearance consistent with age of record HENT: normocephalic; atraumatic Eyes: Normal appearance Neck: supple Heart: Irregular rhythm Lungs:  clear to auscultation bilaterally Abdomen: soft; nondistended; nontender; bowel sounds present Extremities: No deformity; full range of motion; pulses normal Neurologic: Awake, alert; motor function intact in all extremities and symmetric; no facial droop Skin: Warm and dry Psychiatric: Calm, cooperative, pleasant  RESULTS  Summary of this visit's results, reviewed and interpreted by myself:   EKG Interpretation  Date/Time:    Ventricular Rate:    PR Interval:    QRS Duration:   QT Interval:    QTC Calculation:   R Axis:     Text Interpretation:         Laboratory Studies: Results for orders placed or performed during the hospital encounter of 08/01/22 (from the past 24 hour(s))  Comprehensive metabolic panel     Status: Abnormal   Collection Time: 08/01/22  7:30 PM  Result Value Ref Range   Sodium 135 135 - 145 mmol/L   Potassium 3.1 (L) 3.5 - 5.1 mmol/L   Chloride 104 98 - 111 mmol/L   CO2 18 (L) 22 - 32 mmol/L   Glucose, Bld 188 (H) 70 - 99 mg/dL   BUN 28 (H) 8 - 23 mg/dL   Creatinine, Ser 1.61 0.61 - 1.24 mg/dL   Calcium 8.3 (L) 8.9 - 10.3 mg/dL   Total Protein 7.3 6.5 - 8.1 g/dL   Albumin 3.3 (L) 3.5 - 5.0 g/dL   AST 22 15 - 41 U/L   ALT 14 0 - 44 U/L   Alkaline Phosphatase 77 38 - 126 U/L   Total Bilirubin 0.9 0.3 - 1.2 mg/dL   GFR, Estimated >09 >60 mL/min   Anion gap 13 5 - 15  CBC     Status: Abnormal   Collection Time: 08/01/22  7:30 PM  Result Value Ref Range   WBC 2.9 (L) 4.0 - 10.5 K/uL   RBC 3.07 (L) 4.22 - 5.81 MIL/uL   Hemoglobin 9.9 (L) 13.0 - 17.0 g/dL   HCT 45.4 (L) 09.8 - 11.9 %   MCV 100.0 80.0 - 100.0 fL   MCH 32.2 26.0 - 34.0 pg   MCHC 32.2 30.0 - 36.0 g/dL   RDW 14.7 (H) 82.9 - 56.2 %   Platelets 225 150 - 400 K/uL   nRBC 0.0 0.0 - 0.2 %  Brain natriuretic peptide     Status: Abnormal   Collection Time: 08/01/22  7:30 PM  Result Value Ref Range   B Natriuretic Peptide 1,518.8 (H) 0.0 - 100.0 pg/mL   Imaging Studies: DG Chest 2  View  Result Date: 08/01/2022 CLINICAL DATA:  CHF.  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 Stable enlarged heart with some vascular congestion. Small left effusion. No pneumothorax or consolidation. Film is under penetrated. IMPRESSION: Enlarged heart with vascular congestion. Prosthetic valve and atrial occlusion clip. Small left effusion. Electronically Signed   By: Karen Kays M.D.   On: 08/01/2022 19:08    ED COURSE and MDM  Nursing notes, initial and subsequent vitals signs, including pulse oximetry, reviewed and interpreted by myself.  Vitals:   08/02/22 0230 08/02/22 0232 08/02/22 0419 08/02/22 0542  BP: 115/86   121/81  Pulse: 76   (!) 54  Resp: 18   18  Temp: (!) 97.5 F (36.4 C)     TempSrc: Oral     SpO2: 99% 100%  100%  Weight:   85.2 kg   Height:   5\' 8"  (1.727 m)    Medications - No data to display  7:00 AM Patient has been stable with good behavior overnight.  We are awaiting a TTS consult.  We are also awaiting medication reconciliation so that his home medications can be ordered if  he requires a prolonged stay in the ED or inpatient admission.  The patient's MAR did not accompany him from the nursing home.  Nursing home staff was contacted by the patient's nurse who was told they will fax something "when they get around to it".   7:08 AM Signed out to Dr. Durwin Nora.  PROCEDURES  Procedures   ED DIAGNOSES     ICD-10-CM   1. Combative behavior  R46.89          Paula Libra, MD 08/02/22 0700    Paula Libra, MD 08/02/22 [phone]

## 2022-08-02 NOTE — ED Notes (Signed)
This nurse called [facility] to speak with the staff and nurse on duty. I was referred to Mark Cabrera, Administrator, [phone]. He advised the patient was having escalating behaviors of aggression while at [facility]. They stated they would not accept the patient back until the morning, after he has had time to speak with case management. Also stated they felt that it was not safe for the patient nor their staff.   I spoke with the patient's daughter, Mark Cabrera. She stated she was aware that they had sent him back to the hospital. She stated she was concerned because her father can have aggressive behaviors occasionally and she wanted him evaluated for a behavioral health unit.   Currently patient is laying in bed.

## 2022-08-02 NOTE — Progress Notes (Signed)
   08/02/22 1604  Assess: MEWS Score  Temp (!) 97.5 F (36.4 C)  BP 98/88  MAP (mmHg) 93  Pulse Rate (!) 110  Resp 16  SpO2 100 %  O2 Device Nasal Cannula  Assess: MEWS Score  MEWS Temp 0  MEWS Systolic 1  MEWS Pulse 1  MEWS RR 0  MEWS LOC 0  MEWS Score 2  MEWS Score Color Yellow  Assess: if the MEWS score is Yellow or Red  Were vital signs taken at a resting state? Yes  Focused Assessment No change from prior assessment  Does the patient meet 2 or more of the SIRS criteria? No  MEWS guidelines implemented  Yes, yellow  Treat  MEWS Interventions Considered administering scheduled or prn medications/treatments as ordered  Take Vital Signs  Increase Vital Sign Frequency  Yellow: Q2hr x1, continue Q4hrs until patient remains green for 12hrs  Escalate  MEWS: Escalate Yellow: Discuss with charge nurse and consider notifying provider and/or RRT  Assess: SIRS CRITERIA  SIRS Temperature  0  SIRS Pulse 1  SIRS Respirations  0  SIRS WBC 0  SIRS Score Sum  1

## 2022-08-02 NOTE — ED Notes (Signed)
Pt eating snacks happily

## 2022-08-02 NOTE — ED Triage Notes (Signed)
Patient arrives via EMS from [facility]. EMS states they were called out because the patient became aggressive and was lying in the floor. EMS states when they arrive don scene the patient was no aggressive nor combative.

## 2022-08-02 NOTE — Telephone Encounter (Signed)
Pt daughter called, stated she would like to speak with nurse about pt medication.

## 2022-08-03 ENCOUNTER — Ambulatory Visit: Payer: Medicare Other | Admitting: Interventional Cardiology

## 2022-08-03 DIAGNOSIS — Z7984 Long term (current) use of oral hypoglycemic drugs: Secondary | ICD-10-CM | POA: Diagnosis not present

## 2022-08-03 DIAGNOSIS — I472 Ventricular tachycardia, unspecified: Secondary | ICD-10-CM | POA: Diagnosis present

## 2022-08-03 DIAGNOSIS — E1165 Type 2 diabetes mellitus with hyperglycemia: Secondary | ICD-10-CM | POA: Diagnosis present

## 2022-08-03 DIAGNOSIS — Z952 Presence of prosthetic heart valve: Secondary | ICD-10-CM | POA: Diagnosis not present

## 2022-08-03 DIAGNOSIS — Z66 Do not resuscitate: Secondary | ICD-10-CM | POA: Diagnosis present

## 2022-08-03 DIAGNOSIS — Z7401 Bed confinement status: Secondary | ICD-10-CM | POA: Diagnosis not present

## 2022-08-03 DIAGNOSIS — F0284 Dementia in other diseases classified elsewhere, unspecified severity, with anxiety: Secondary | ICD-10-CM | POA: Diagnosis present

## 2022-08-03 DIAGNOSIS — Z22322 Carrier or suspected carrier of Methicillin resistant Staphylococcus aureus: Secondary | ICD-10-CM | POA: Diagnosis not present

## 2022-08-03 DIAGNOSIS — Z7901 Long term (current) use of anticoagulants: Secondary | ICD-10-CM | POA: Diagnosis not present

## 2022-08-03 DIAGNOSIS — I5043 Acute on chronic combined systolic (congestive) and diastolic (congestive) heart failure: Secondary | ICD-10-CM | POA: Diagnosis present

## 2022-08-03 DIAGNOSIS — I5023 Acute on chronic systolic (congestive) heart failure: Secondary | ICD-10-CM | POA: Diagnosis present

## 2022-08-03 DIAGNOSIS — I4819 Other persistent atrial fibrillation: Secondary | ICD-10-CM | POA: Diagnosis present

## 2022-08-03 DIAGNOSIS — R0602 Shortness of breath: Secondary | ICD-10-CM | POA: Diagnosis present

## 2022-08-03 DIAGNOSIS — E876 Hypokalemia: Secondary | ICD-10-CM | POA: Diagnosis present

## 2022-08-03 DIAGNOSIS — Z743 Need for continuous supervision: Secondary | ICD-10-CM | POA: Diagnosis not present

## 2022-08-03 DIAGNOSIS — D649 Anemia, unspecified: Secondary | ICD-10-CM | POA: Diagnosis not present

## 2022-08-03 DIAGNOSIS — E440 Moderate protein-calorie malnutrition: Secondary | ICD-10-CM | POA: Diagnosis present

## 2022-08-03 DIAGNOSIS — R456 Violent behavior: Secondary | ICD-10-CM | POA: Diagnosis not present

## 2022-08-03 DIAGNOSIS — I517 Cardiomegaly: Secondary | ICD-10-CM | POA: Diagnosis not present

## 2022-08-03 DIAGNOSIS — Z794 Long term (current) use of insulin: Secondary | ICD-10-CM | POA: Diagnosis not present

## 2022-08-03 DIAGNOSIS — R531 Weakness: Secondary | ICD-10-CM | POA: Diagnosis not present

## 2022-08-03 DIAGNOSIS — B379 Candidiasis, unspecified: Secondary | ICD-10-CM | POA: Diagnosis present

## 2022-08-03 DIAGNOSIS — Z8249 Family history of ischemic heart disease and other diseases of the circulatory system: Secondary | ICD-10-CM | POA: Diagnosis not present

## 2022-08-03 DIAGNOSIS — D72819 Decreased white blood cell count, unspecified: Secondary | ICD-10-CM | POA: Diagnosis present

## 2022-08-03 DIAGNOSIS — I2489 Other forms of acute ischemic heart disease: Secondary | ICD-10-CM | POA: Diagnosis present

## 2022-08-03 DIAGNOSIS — E871 Hypo-osmolality and hyponatremia: Secondary | ICD-10-CM | POA: Diagnosis present

## 2022-08-03 DIAGNOSIS — F02818 Dementia in other diseases classified elsewhere, unspecified severity, with other behavioral disturbance: Secondary | ICD-10-CM | POA: Diagnosis present

## 2022-08-03 DIAGNOSIS — F03918 Unspecified dementia, unspecified severity, with other behavioral disturbance: Secondary | ICD-10-CM | POA: Diagnosis not present

## 2022-08-03 DIAGNOSIS — K7460 Unspecified cirrhosis of liver: Secondary | ICD-10-CM | POA: Diagnosis present

## 2022-08-03 DIAGNOSIS — J90 Pleural effusion, not elsewhere classified: Secondary | ICD-10-CM | POA: Diagnosis not present

## 2022-08-03 DIAGNOSIS — E785 Hyperlipidemia, unspecified: Secondary | ICD-10-CM | POA: Diagnosis present

## 2022-08-03 DIAGNOSIS — I2510 Atherosclerotic heart disease of native coronary artery without angina pectoris: Secondary | ICD-10-CM | POA: Diagnosis present

## 2022-08-03 DIAGNOSIS — I700 Atherosclerosis of aorta: Secondary | ICD-10-CM | POA: Diagnosis not present

## 2022-08-03 DIAGNOSIS — I110 Hypertensive heart disease with heart failure: Secondary | ICD-10-CM | POA: Diagnosis present

## 2022-08-03 DIAGNOSIS — I480 Paroxysmal atrial fibrillation: Secondary | ICD-10-CM | POA: Diagnosis not present

## 2022-08-03 DIAGNOSIS — Z79899 Other long term (current) drug therapy: Secondary | ICD-10-CM | POA: Diagnosis not present

## 2022-08-03 LAB — GLUCOSE, CAPILLARY
Glucose-Capillary: 164 mg/dL — ABNORMAL HIGH (ref 70–99)
Glucose-Capillary: 225 mg/dL — ABNORMAL HIGH (ref 70–99)
Glucose-Capillary: 267 mg/dL — ABNORMAL HIGH (ref 70–99)
Glucose-Capillary: 183 mg/dL — ABNORMAL HIGH (ref 70–99)

## 2022-08-03 LAB — BASIC METABOLIC PANEL
Anion gap: 10 (ref 5–15)
BUN: 31 mg/dL — ABNORMAL HIGH (ref 8–23)
CO2: 21 mmol/L — ABNORMAL LOW (ref 22–32)
Calcium: 8.2 mg/dL — ABNORMAL LOW (ref 8.9–10.3)
Chloride: 103 mmol/L (ref 98–111)
Creatinine, Ser: 1.11 mg/dL (ref 0.61–1.24)
GFR, Estimated: >60 mL/min (ref >60)
Glucose, Bld: 164 mg/dL — ABNORMAL HIGH (ref 70–99)
Potassium: 3.3 mmol/L — ABNORMAL LOW (ref 3.5–5.1)
Sodium: 134 mmol/L — ABNORMAL LOW (ref 135–145)

## 2022-08-03 LAB — CBC
HCT: 27.4 % — ABNORMAL LOW (ref 39.0–52.0)
Hemoglobin: 8.9 g/dL — ABNORMAL LOW (ref 13.0–17.0)
MCH: 32 pg (ref 26.0–34.0)
MCHC: 32.5 g/dL (ref 30.0–36.0)
MCV: 98.6 fL (ref 80.0–100.0)
Platelets: 205 10*3/uL (ref 150–400)
RBC: 2.78 MIL/uL — ABNORMAL LOW (ref 4.22–5.81)
RDW: 16.9 % — ABNORMAL HIGH (ref 11.5–15.5)
WBC: 2.5 10*3/uL — ABNORMAL LOW (ref 4.0–10.5)
nRBC: 0 % (ref 0.0–0.2)

## 2022-08-03 LAB — HEMOGLOBIN A1C
Hgb A1c MFr Bld: 8.5 % — ABNORMAL HIGH (ref 4.8–5.6)
Mean Plasma Glucose: 197 mg/dL

## 2022-08-03 MED ORDER — POTASSIUM CHLORIDE CRYS ER 20 MEQ PO TBCR
40.0000 meq | EXTENDED_RELEASE_TABLET | Freq: Once | ORAL | Status: AC
  Administered 2022-08-03: 40 meq via ORAL
  Filled 2022-08-03: qty 2

## 2022-08-03 MED ORDER — LORAZEPAM 1 MG PO TABS: 1.0000 mg | ORAL_TABLET | Freq: Four times a day (QID) | ORAL | Status: DC | PRN

## 2022-08-03 MED ORDER — ADULT MULTIVITAMIN W/MINERALS CH
1.0000 | ORAL_TABLET | Freq: Every day | ORAL | Status: DC
  Administered 2022-08-04 – 2022-08-14 (×11): 1 via ORAL
  Filled 2022-08-03 (×11): qty 1

## 2022-08-03 MED ORDER — FUROSEMIDE 10 MG/ML IJ SOLN
40.0000 mg | Freq: Once | INTRAMUSCULAR | Status: AC
  Administered 2022-08-03: 40 mg via INTRAVENOUS
  Filled 2022-08-03: qty 4

## 2022-08-03 MED ORDER — MAGNESIUM SULFATE 2 GM/50ML IV SOLN
2.0000 g | Freq: Once | INTRAVENOUS | Status: AC
  Administered 2022-08-03: 2 g via INTRAVENOUS
  Filled 2022-08-03: qty 50

## 2022-08-03 MED ORDER — LORAZEPAM 0.5 MG PO TABS
0.5000 mg | ORAL_TABLET | Freq: Four times a day (QID) | ORAL | Status: DC | PRN
  Administered 2022-08-03 – 2022-08-13 (×17): 0.5 mg via ORAL
  Filled 2022-08-03 (×18): qty 1

## 2022-08-03 MED ORDER — ENSURE ENLIVE PO LIQD
237.0000 mL | Freq: Two times a day (BID) | ORAL | Status: DC
  Administered 2022-08-04 – 2022-08-14 (×18): 237 mL via ORAL

## 2022-08-03 NOTE — Progress Notes (Signed)
Initial Nutrition Assessment  DOCUMENTATION CODES:   Non-severe (moderate) malnutrition in context of chronic illness  INTERVENTION:  - Heart Healthy/Carb Modified per MD.  - Ensure Plus High Protein po BID, each supplement provides 350 kcal and 20 grams of protein. - Multivitamin with minerals daily - Monitor weight trends.   NUTRITION DIAGNOSIS:   Moderate Malnutrition related to chronic illness as evidenced by moderate fat depletion, moderate muscle depletion.  GOAL:   Patient will meet greater than or equal to 90% of their needs  MONITOR:   PO intake, Supplement acceptance, Weight trends  REASON FOR ASSESSMENT:   Malnutrition Screening Tool    ASSESSMENT:   72 year old male with chronic combined systolic and diastolic CHF, dementia who presented with complaints of behavioral disturbances from his nursing home, as well as suspected acute on chronic combined CHF.  Patient in bed at time of visit, sitter at bedside.   He is unsure of a UBW or if he has had any changes in weight recently, noted to have history of dementia.  Per EMR, weight without significant changes. However, patient weighed at first 187# then 173# this admission, taken within the same day. Therefore, current weight status difficult to assess.  Patient unsure how he was eating PTA. Current appetite is good and he reports eating well today. Documented to have had 100% of breakfast and lunch today.  Endorses having had nutrition supplements in the past and enjoying. Agreeable to receive Ensure during admission and requesting one during visit, which was provided. RN aware. Encouraged patient to eat well at all meals to help prevent weight loss during admission.   Medications reviewed and include: Colace, Insulin  Labs reviewed:  Na 134 K+ 3.3 HA1C 8.5 Blood Glucose 163-278 x24 hours   NUTRITION - FOCUSED PHYSICAL EXAM:  Flowsheet Row Most Recent Value  Orbital Region Moderate depletion  Upper  Arm Region Moderate depletion  Thoracic and Lumbar Region Moderate depletion  Buccal Region Severe depletion  Temple Region Moderate depletion  Clavicle Bone Region Moderate depletion  Clavicle and Acromion Bone Region Moderate depletion  Scapular Bone Region Unable to assess  Dorsal Hand Mild depletion  Patellar Region Moderate depletion  Anterior Thigh Region Moderate depletion  Posterior Calf Region Moderate depletion  Edema (RD Assessment) None  Hair Reviewed  Eyes Reviewed  Mouth Reviewed  Skin Reviewed  Nails Reviewed       Diet Order:   Diet Order             Diet heart healthy/carb modified Room service appropriate? Yes; Fluid consistency: Thin  Diet effective now                   EDUCATION NEEDS:  Education needs have been addressed  Skin:  Skin Assessment: Reviewed RN Assessment  Last BM:  5/23  Height:  Ht Readings from Last 1 Encounters:  08/02/22 5\' 8"  (1.727 m)   Weight:  Wt Readings from Last 1 Encounters:  08/02/22 78.6 kg    BMI:  Body mass index is 26.35 kg/m.  Estimated Nutritional Needs:  Kcal:  1800-1950 kcals Protein:  95-115 grams Fluid:  >/= 1.8L    Shelle Iron RD, LDN For contact information, refer to [facility].

## 2022-08-03 NOTE — Consult Note (Addendum)
Cardiology Consultation   Patient ID: Mark Cabrera MRN: [id number]; DOB: 1950-09-13  Admit date: 08/02/2022 Date of Consult: 08/03/2022  PCP:  Mark Kanner, MD   [facility] Providers Cardiologist:  Mark Carrow, MD   Patient Profile:   Mark Cabrera is a 72 y.o. male with a history of CAD s/p CABG x2 (SVG to PDA and SVG to OM1) in 03/2016 and subsequent DES to DES to native mid LAD and DES to SVG to PDA in 12/2018, severe aortic stenosis s/p AVR at time of CABG in 03/2016, paroxysmal atrial fibrillation s/p left atrial appendage clipping at time of CABG/ AVR on Eliquis, chronic HFrEF with EF of 30-35% in 02/2022, CVA in 2007 and again in 06/2018, pericardial effusion following CABG requiring pericardial window, hypertension, hyperlipidemia, type 2 diabetes mellitus, hepatic cirrhosis, GERD, and dementia  who is being seen 08/03/2022 for the evaluation of CHF at the request of Dr. Denton Cabrera.  History of Present Illness:   Mark Cabrera is a 72 year old male with the above history who is followed by Dr. Clifton Cabrera. Patient underwent CABG x2 with SVG to PDA and SVG to OM1 as well as AVR for severe aortic stenosis and left atrial appendage clipping in 03/2016. He had post-op atrial fibrillation and volume overload. He was readmitted in 04/2016 a couple of weeks after his surgery for a large pericardial effusion, pleural effusions, and acute blood los anemia. He ultimately required a cardiac window, thoracentesis, and transfusion of PRBCs. He had ongoing chest pain following this and ultimately underwent removal of his sternal wires in 08/2017. He was admitted to [facility] in 12/2018 with chest pain. LHC at that time showed occlusion of the SVG to [facility], severe stenosis of the SVG to PDA, and CTO of the LAD. He underwent DES to the native mid LAD and SVG to PDA at that time. He has continued to have intermittent chest pain since that time. He was last seen in our office in 02/2021  at which time he mentioned having one episode of chest pain that he thought was GI in nature but was otherwise doing well.   He was admitted in 02/2022 for acute on chronic CHF and bilateral pleural effusions after presenting with shortness of breath and lower extremity edema. Echo at that time showed LVEF of 30-35% (down from 50-55 in 09/2021) with akinesis of the apex and apical septal segment, moderate reduced RV function, moderate MR, and mildly elevated PASP.  He was diuresed with IV Lasix and underwent a right and left thoracentesis with removal of 1,300 cc of fluid on each side. GDMT was initiated. Given underlying dementia and other comorbidities, further invasive testing was not recommended and plan was for medical management of his cardiomyopathy.  Patient was seen in the ED on 07/28/2022 for altered mental status. He lives at [facility] nursing facility. Per nurse at nursing facility, patient became very anxious and started attempting to throw himself on the ground saying he was unable to breathe but would not wear his O2. Altered mental status was felt to be due to his underlying dementia. High-sensitivity was minimally elevated at 37 (appears to be chronically elevated). BNP was 2,006. Chest x-ray showed cardiomegaly with findings suggestive of mild pulmonary vascular congestion. Head CT showed no acute findings. He was given a dose of IV Lasix and discharged back to nursing facility.   He returned to the ED on 08/01/2022 for aggression and confusion. However, EMS reported that patient  was not aggressive or combative when they arrived. Patient was seen by [facility] while in the ED who felt like patient did not meet criteria for inpatient psychiatry admission. However, adjustments were made to his medications and he was monitored overnight at the request of SNF. Daughter was also concerned about his recent CHF exacerbation. Patient was supposed to be seen by Cardiology on 08/02/2022 but  missed this due to being in the ED. In the ED, O2 sat 88% on room air. High-sensitivity troponin 29. BNP 1,518. Chest x-ray showed suspect developing left lower lobe airspace opacification and small left pleural effusion. WBC 2.9, Hgb 9.9, Plts 225. Na 135, K 3.1, Glucose 188, BUN 28, Cr 1.09. He received a dose of IV Lasix and was admitted by Internal Medicine. Cardiology consulted for assistance.   At the time of this evaluation, patient is sleeping. He will briefly open his eye when I call his name and would shake his head to questions but otherwise very sleepy and unable to engage in conversation. No family is at presents. He did shake his head "no" when asked if he was having any chest pain, shortness of breath, or palpitations. Remainder of history obtained from chart review. Patient has underlying dementia with occasional behavioral disturbance and aggression. Per ED note, patient indicated he generally feels fine and has a normal appetite. He reported he generally gets long fine with residents and staff at his SNF but indicated they occasional get upset with him when he doesn't act how they want. He reported occasional irritation to the skin of his scrotal area but no acute worsening. No dysuria, hematuria, fevers, or chills. Daughter Mark Cabrera) also reported to admitting provider that from what she has witnessed he seems to get very anxious and then almost has a panic attack. Daughter spoke with patient's Neurologist who recommended stopping Aricept as there was some concern of aggressive behaviors since starting the medication.   Past Medical History:  Diagnosis Date   Acute diastolic congestive heart failure (HCC)    AKI (acute kidney injury) (HCC)    Aortic valve stenosis s/p AVR 2018   Echo 2/22: Poor acoustic windows, EF 50-55, mild LVH, normal RVSF, mild MR, trivial AI, no AS, no pericardial effusion   Atrial fibrillation (HCC) - post-op CABG    04/2016 CHA2DS2VAS score = 5   Atypical nevi     Coronary artery disease s/p 2 vessel CABG    Depression    "years ago"   Diabetes mellitus    Dyspnea    in the past    GERD (gastroesophageal reflux disease)    Heart murmur    Hepatic cirrhosis (HCC) 06/29/2017   History of kidney stones    Hyperlipidemia    hx of transaminitis secondary to statin and he has decided not to use statins secondary to potential side effects.   Hypertension    Nephrolithiasis    Osteoarthritis, knee    Pericardial effusion a. subxiphoid pericardial window on 04/21/2016 04/28/2016   Peripheral neuropathic pain 05/12/2016   Sleep apnea     Central apnea. 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 2-D  echo was normal no embolic source found.    Past Surgical History:  Procedure Laterality Date   AORTIC VALVE REPLACEMENT N/A 03/30/2016   Procedure: AORTIC VALVE REPLACEMENT (AVR);  Surgeon: Loreli Slot, MD;  Location: [facility] OR;  Service: Open Heart Surgery;  Laterality: N/A;   CARDIAC CATHETERIZATION N/A 03/15/2016   Procedure: Right/Left Heart Cath and Coronary Angiography;  Surgeon: Kathleene Hazel, MD;  Location: [facility] INVASIVE CV LAB;  Service: Cardiovascular;  Laterality: N/A;   CLIPPING OF ATRIAL APPENDAGE N/A 03/30/2016   Procedure: CLIPPING OF LEFT ATRIAL APPENDAGE;  Surgeon: Loreli Slot, MD;  Location: [facility] OR;  Service: Open Heart Surgery;  Laterality: N/A;   CORONARY ARTERY BYPASS GRAFT N/A 03/30/2016   Procedure: CORONARY ARTERY BYPASS GRAFTING (CABG) Times Two;  Surgeon: Loreli Slot, MD;  Location: [facility] OR;  Service: Open Heart Surgery;  Laterality: N/A;   IR THORACENTESIS ASP  PLEURAL SPACE W/IMG GUIDE  02/28/2022   IR THORACENTESIS ASP PLEURAL SPACE W/IMG GUIDE  03/02/2022   KNEE ARTHROSCOPY W/ PARTIAL MEDIAL MENISCECTOMY  05/12/2005   right, performed by Dr. Madelon Lips for torn medial meniscus.   ROTATOR CUFF REPAIR Right 2016   STERNAL WIRES REMOVAL N/A 08/13/2017   Procedure: STERNAL WIRES REMOVAL;  Surgeon: Loreli Slot, MD;  Location: [facility] OR;  Service: Thoracic;  Laterality: N/A;   SUBXYPHOID PERICARDIAL WINDOW N/A 04/21/2016   Procedure: SUBXYPHOID PERICARDIAL WINDOW;  Surgeon: Loreli Slot, MD;  Location: [facility] OR;  Service: Thoracic;  Laterality: N/A;   TEE WITHOUT CARDIOVERSION N/A 03/30/2016   Procedure: TRANSESOPHAGEAL ECHOCARDIOGRAM (TEE);  Surgeon: Loreli Slot, MD;  Location: [facility] OR;  Service: Open Heart Surgery;  Laterality: N/A;   TEE WITHOUT CARDIOVERSION N/A 04/21/2016   Procedure: TRANSESOPHAGEAL ECHOCARDIOGRAM (TEE);  Surgeon: Loreli Slot, MD;  Location: [facility] OR;  Service: Thoracic;  Laterality: N/A;     Home Medications:  Prior to Admission medications   Medication Sig Start Date End Date Taking? Authorizing Provider  apixaban (ELIQUIS) 5 MG TABS tablet Take 1 tablet by mouth twice daily Patient taking differently: Take 5 mg by mouth 2 (two) times daily. 12/08/20  Yes Kathleene Hazel, MD  busPIRone (BUSPAR) 7.5 MG tablet Take 7.5 mg by mouth 2 (two) times daily.   Yes [provider]  clotrimazole (LOTRIMIN) 1 % cream Apply topically. 07/31/22  Yes [provider]  divalproex (DEPAKOTE) 125 MG DR tablet Take 125 mg by mouth 2 (two) times daily. 07/25/22  Yes [provider]  escitalopram (LEXAPRO) 10 MG tablet Take 1 tablet (10 mg total) by mouth daily. 12/02/20  Yes Christian, Rylee, MD  Evolocumab (REPATHA) 140 MG/ML SOSY Inject 140 mg into the skin. Every 14 days   Yes [provider]  fluconazole (DIFLUCAN) 150 MG tablet Take 150 mg by mouth every 3 (three) days. 07/31/22  Yes  [provider]  furosemide (LASIX) 40 MG tablet Take 40 mg by mouth 2 (two) times daily.   Yes [provider]  insulin aspart (NOVOLOG FLEXPEN) 100 UNIT/ML FlexPen INJECT 5 UNITS UNDER THE SKIN THREE TIMES DAILY WITH MEALS Patient taking differently: Inject 5 Units into the skin 3 (three) times daily with meals. 03/03/22  Yes Willette Cluster, MD  insulin glargine-yfgn [facility]) 100 UNIT/ML injection Inject 0.26 mLs (26 Units total) into the skin at bedtime. Patient taking differently: Inject 34 Units into the skin at bedtime. 03/03/22  Yes Willette Cluster, MD  ipratropium-albuterol (DUONEB) 0.5-2.5 (3) MG/3ML SOLN  Inhale 3 mLs into the lungs every 6 (six) hours as needed (for shortness of breath).   Yes [provider]  levofloxacin (LEVAQUIN) 500 MG tablet Take 500 mg by mouth daily. 07/25/22  Yes [provider]  metFORMIN (GLUCOPHAGE) 500 MG tablet Take 1 tablet (500 mg total) by mouth 2 (two) times daily with a meal. 12/30/20 03/19/23 Yes Amponsah, Flossie Buffy, MD  metoprolol succinate (TOPROL-XL) 25 MG 24 hr tablet Take 0.5 tablets (12.5 mg total) by mouth daily. 01/26/22 08/02/22 Yes Adron Bene, MD  polyethylene glycol (MIRALAX / GLYCOLAX) 17 g packet Take 17 g by mouth daily. 09/29/21  Yes Morene Crocker, MD  potassium chloride SA (KLOR-CON M) 20 MEQ tablet One po bid x 3 days, then one po once a day 08/01/22  Yes Cathren Laine, MD  spironolactone (ALDACTONE) 25 MG tablet Take 0.5 tablets (12.5 mg total) by mouth daily. 03/04/22  Yes Willette Cluster, MD  valACYclovir (VALTREX) 1000 MG tablet Take 1,000 mg by mouth 2 (two) times daily.   Yes [provider]  vitamin B-12 (CYANOCOBALAMIN) 100 MCG tablet Take 100 mcg by mouth daily.   Yes [provider]  Accu-Chek Softclix Lancets lancets Use as instructed Patient not taking: Reported on 08/02/2022 12/03/20   Dolan Amen, MD  acetaminophen (TYLENOL) 325 MG tablet Take 2 tablets (650 mg  total) by mouth every 4 (four) hours as needed for headache or mild pain. Patient not taking: Reported on 08/02/2022 04/28/16   Leone Brand, NP  Alirocumab (PRALUENT) 75 MG/ML SOAJ Inject 1 Pen into the skin every 14 (fourteen) days. Patient not taking: Reported on 08/02/2022 03/21/22   Kathleene Hazel, MD  donepezil (ARICEPT) 5 MG tablet Take 1 tablet (5 mg total) by mouth at bedtime. Patient not taking: Reported on 08/02/2022 05/29/22   Windell Norfolk, MD  glucose blood (ACCU-CHEK GUIDE) test strip Use as instructed Patient not taking: Reported on 08/02/2022 12/03/20   Dolan Amen, MD  Insulin Pen Needle 31G X 5 MM MISC Use one pen needle three times daily. Dx E11.49 Patient not taking: Reported on 08/02/2022 09/14/21   Mark Kanner, MD  QUEtiapine (SEROQUEL) 50 MG tablet Take 1 tablet (50 mg total) by mouth at bedtime. Patient not taking: Reported on 08/02/2022 08/01/22   Cathren Laine, MD    Inpatient Medications: Scheduled Meds:  apixaban  5 mg Oral BID   busPIRone  7.5 mg Oral BID   divalproex  250 mg Oral BID   docusate sodium  100 mg Oral BID   escitalopram  10 mg Oral Daily   insulin aspart  0-15 Units Subcutaneous TID WC   insulin aspart  0-5 Units Subcutaneous QHS   metoprolol succinate  12.5 mg Oral Daily   QUEtiapine  75 mg Oral QHS   spironolactone  12.5 mg Oral Daily   valACYclovir  1,000 mg Oral BID   Continuous Infusions:   PRN Meds: acetaminophen **OR** acetaminophen, albuterol, ipratropium-albuterol, ondansetron **OR** ondansetron (ZOFRAN) IV, polyethylene glycol, traZODone  Allergies:    Allergies  Allergen Reactions   Morpholine Salicylate Other (See Comments)    Hallucinations   Penicillins Other (See Comments)    Allergic- reaction?? Has patient had a PCN reaction causing immediate rash, facial/tongue/throat swelling, SOB or lightheadedness with hypotension: Unk Has patient had a PCN reaction causing severe rash involving mucus membranes or skin  necrosis: Unk Has patient had a PCN reaction that required hospitalization: Unk Has patient had a PCN reaction occurring within  the last 10 years: No If all of the above answers are "NO", then may proceed with Cephalosporin use.   Morphine And Codeine Other (See Comments)    Hallucinations    Sglt2 Inhibitors Rash and Other (See Comments)    Candidiasis infection prone   Statins Rash and Other (See Comments)    Severe rash and back pain, and muscle pain    Social History:   Social History   Socioeconomic History   Marital status: Divorced    Spouse name: Not on file   Number of children: 1   Years of education: 2y college   Highest education level: Not on file  Occupational History   Occupation:  Medicaid /disability    Employer: UNEMPLOYED    Comment: following stroke in 2007  Tobacco Use   Smoking status: Never   Smokeless tobacco: Never  Vaping Use   Vaping Use: Never used  Substance and Sexual Activity   Alcohol use: Yes    Alcohol/week: 1.0 standard drink of alcohol    Types: 1 Cans of beer per week    Comment: occasional   Drug use: No   Sexual activity: Not Currently  Other Topics Concern   Not on file  Social History Narrative   Single but has a girlfriend.    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       Patient has medications disability post stroke.            Social Determinants of Health   Financial Resource Strain: Medium Risk (01/18/2022)   Overall Financial Resource Strain (CARDIA)    Difficulty of Paying Living Expenses: Somewhat hard  Food Insecurity: No Food Insecurity (08/02/2022)   Hunger Vital Sign    Worried About Running Out of Food in the Last Year: Never true    Ran Out of Food in the Last Year: Never true  Transportation Needs: No Transportation Needs (08/02/2022)   PRAPARE - Administrator, Civil Service (Medical): No    Lack of Transportation (Non-Medical): No  Physical Activity:  Inactive (01/18/2022)   Exercise Vital Sign    Days of Exercise per Week: 0 days    Minutes of Exercise per Session: 0 min  Stress: Stress Concern Present (01/18/2022)   [employer] of Occupational Health - Occupational Stress Questionnaire    Feeling of Stress : Very much  Social Connections: Moderately Isolated (01/18/2022)   Social Connection and Isolation Panel [NHANES]    Frequency of Communication with Friends and Family: More than three times a week    Frequency of Social Gatherings with Friends and Family: Once a week    Attends Religious Services: 1 to 4 times per year    Active Member of [employer] or Organizations: No    Attends Banker Meetings: Never    Marital Status: Divorced  Catering manager Violence: Not At Risk (02/27/2022)   Humiliation, Afraid, Rape, and Kick questionnaire    Fear of Current or Ex-Partner: No    Emotionally Abused: No    Physically Abused: No    Sexually Abused: No    Family History:   Family History  Problem Relation Age of Onset   Hypertension Mother    Diabetes Mother    Alcohol abuse Father      ROS:  Please see the history of present illness.  Review of Systems  Unable to perform ROS: Dementia    Physical Exam/Data:  Vitals:   08/02/22 1827 08/02/22 2209 08/03/22 0158 08/03/22 0540  BP: 116/84 112/73 116/77 101/82  Pulse: 84 82 78 83  Resp: 20 18 18 18   Temp: 97.7 F (36.5 C) (!) 97.4 F (36.3 C) 97.8 F (36.6 C) 97.7 F (36.5 C)  TempSrc: Oral Oral Oral Oral  SpO2: 100%  100% 100%  Weight:      Height:        Intake/Output Summary (Last 24 hours) at 08/03/2022 1314 Last data filed at 08/03/2022 1252 Gross per 24 hour  Intake 820 ml  Output 1100 ml  Net -280 ml      08/02/2022    4:04 PM 08/02/2022    4:19 AM 08/01/2022    6:49 PM  Last 3 Weights  Weight (lbs) 173 lb 4.5 oz 187 lb 13.3 oz 187 lb 13.3 oz  Weight (kg) 78.6 kg 85.2 kg 85.2 kg     Body mass index is 26.35 kg/m.  General: 72 y.o.  Caucasian male resting comfortably in no acute distress. Somnolent/ sleepy this morning. HEENT: Normocephalic and atraumatic. Sclera clear.  Neck: Supple. No JVD. Heart: Irregularly irregular rhythm with normal rate. Distinct S1 and S2. No significant murmurs, gallops, or rubs.  Lungs: No increased work of breathing. Possible rhonchi noted anteriorly but may be upper airway noises as patient was sleeping and softly snoring.  Abdomen: Soft, non-distended, and non-tender to palpation. Extremities: No lower extremity edema.    Skin: Warm and dry. Neuro: No focal deficits. Psych: Somnolent this morning.   EKG:  The EKG was personally reviewed: Initial EKG on 08/01/2022 showed atrial fibrillation, rate 78, bpm with non-specific ST/T wave changes. Repeat EKG on 08/02/2022 showed atrial fibrillation, rate 110, with PVC and non-specific T wave changes. Telemetry:  Telemetry was personally reviewed and demonstrates:  Atrial fibrillation with rates in the 80s. PVCs, ventricular couplets, and short runs of NSVT (longest about 5 beats) also noted.  Relevant CV Studies:  Echocardiogram 02/28/2022: Impressions:  1. Left ventricular ejection fraction, by estimation, is 30 to 35%. The  left ventricle has moderately decreased function. The left ventricle  demonstrates regional wall motion abnormalities (see scoring  diagram/findings for description). Left ventricular   diastolic function could not be evaluated.   2. Right ventricular systolic function is moderately reduced. The right  ventricular size is normal. There is mildly elevated pulmonary artery  systolic pressure. The estimated right ventricular systolic pressure is  38.2 mmHg.   3. Left atrial size was mild to moderately dilated.   4. The mitral valve is grossly normal. Moderate mitral valve  regurgitation. No evidence of mitral stenosis.   5. The aortic valve is tricuspid. Aortic valve regurgitation is not  visualized. No aortic stenosis is  present.   6. The inferior vena cava is dilated in size with <50% respiratory  variability, suggesting right atrial pressure of 15 mmHg.   Comparison(s): Changes from prior study are noted. The left ventricular  function is significantly worse.    Laboratory Data:  High Sensitivity Troponin:   Recent Labs  Lab 07/28/22 2210 08/02/22 1338  TROPONINIHS 37* 29*     Chemistry Recent Labs  Lab 08/01/22 1930 08/02/22 1338 08/03/22 0346  NA 135 132* 134*  K 3.1* 4.1 3.3*  CL 104 99 103  CO2 18* 19* 21*  GLUCOSE 188* 287* 164*  BUN 28* 31* 31*  CREATININE 1.09 1.28* 1.11  CALCIUM 8.3* 8.4* 8.2*  MG  --  1.5*  --  GFRNONAA >60 59* >60  ANIONGAP 13 14 10     Recent Labs  Lab 07/28/22 2210 08/01/22 1930  PROT 7.7 7.3  ALBUMIN 3.4* 3.3*  AST 22 22  ALT 14 14  ALKPHOS 75 77  BILITOT 0.8 0.9   Lipids No results for input(s): "CHOL", "TRIG", "HDL", "LABVLDL", "LDLCALC", "CHOLHDL" in the last 168 hours.  Hematology Recent Labs  Lab 07/28/22 2210 08/01/22 1930 08/03/22 0346  WBC 3.4* 2.9* 2.5*  RBC 3.10* 3.07* 2.78*  HGB 10.0* 9.9* 8.9*  HCT 30.2* 30.7* 27.4*  MCV 97.4 100.0 98.6  MCH 32.3 32.2 32.0  MCHC 33.1 32.2 32.5  RDW 16.4* 17.0* 16.9*  PLT 338 225 205   Thyroid No results for input(s): "TSH", "FREET4" in the last 168 hours.  BNP Recent Labs  Lab 07/28/22 2210 08/01/22 1930 08/02/22 1338  BNP 2,006.3* 1,518.8* 1,451.9*    DDimer No results for input(s): "DDIMER" in the last 168 hours.   Radiology/Studies:  [facility] Chest Port 1 View  Result Date: 08/02/2022 CLINICAL DATA:  Shortness of breath. EXAM: PORTABLE CHEST 1 VIEW COMPARISON:  08/01/2022 and CT chest 02/27/2022. FINDINGS: Trachea is midline. Heart is enlarged. Left atrial appendage clip. Aortic valve replacement. There may be retrocardiac opacification. Difficult to exclude a small left pleural effusion. No interstitial prominence and indistinctness. IMPRESSION: 1. Suspect developing left lower  lobe airspace opacification which may be due to atelectasis or pneumonia. 2. Small left pleural effusion. Electronically Signed   By: Leanna Battles M.D.   On: 08/02/2022 14:40   DG Chest 2 View  Result Date: 08/01/2022 CLINICAL DATA:  CHF.  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 Stable enlarged heart with some vascular congestion. Small left effusion. No pneumothorax or consolidation. Film is under penetrated. IMPRESSION: Enlarged heart with vascular congestion. Prosthetic valve and atrial occlusion clip. Small left effusion. Electronically Signed   By: Karen Kays M.D.   On: 08/01/2022 19:08     Assessment and Plan:   Acute on Chronic HFrEF Patient was seen in the ED on 07/28/2022 for altered mental status (underlying dementia) but work-up revealed a markedly elevated BNP of 2.006 and vascular congestion on EKG . He was given a dose of IV Lasix and discharged back to SNF.  He then presented back to the ED on 08/01/2022 for aggression and confusion but was also felt to be volume overloaded. BNP 1,518 >> 1,451. Chest x-rays showed a suspect developing left lower lobe opacity (atelectasis vs pneumonia) and small left pleural effusion. Last Echo in 02/2022 showed LVEF of 30-35% with akinesis of the apex and apical septal segment, moderate reduced RV function, moderate MR, and mildly elevated PASP.  He was given another dose of IV Lasix in the ED with 700 cc of documented urinary output. Creatinine improving with diuresis. - He does not appear significantly volume overloaded on exam. - Will give another dose of IV Lasix 40mg  today and then can reassess tomorrow. - Continue Spironolactone 12.5mg  daily.  - BP soft so will hold off on ARB for now (I don't think he would tolerate Entresto). - No SGLT2 inhibitor given candidiasis infection.  - Monitor daily weights strict I/Os, and renal function.  - Not a  good candidate for any invasive cardiac work-up given advanced dementia. Therefore, plan is for medical management of his cardiomyopathy.  CAD s/p CABG Demand Ischemia S/p CABG x2 with SVG to PDA and  SVG to OM in 2018. Last LHC in 12/2018 showed occluded SVG to OM, severe stenosis of SVG to PDA, and CTO of LAD. He underwent PCI with DES to SVG to PDA and DES to native LAD. High-sensitivity troponin 29 in the ED (down from 37 on 5/17) consistent with demand ischemia. - No chest pain.  - No aspirin given need for DOAC.  - Intolerant to statins. On Praluent.  Persistent Atrial Fibrillation Presented in atrial fibrillation. He was also in atrial fibrillation during admission in 02/2022.  - Rates well controlled in the 80s. Telemetry also shows PVC, ventricular couplets, and short runs of NSVT. - Potassium 3.1 on admission. Repleted and 3.3 today. Keep >4.0.  - Magnesium 1.5 yesterday but does not look like this was repleted. Will replete today. Keep >2.0.  - Continue Toprol-XL 12.5mg  daily. - Continue Eliquis 5mg  twice daily.  Hypertension BP soft but stable. - Continue medications for CHF as above.  Otherwise, per primary team: - Hypokalemia - Hypomagnesemia - Type 2 diabetes mellitus  - Dementia with behavioral disturbance  Risk Assessment/Risk Scores:   New York Heart Association (NYHA) Functional Class Difficult to assess functional status given severe dementia.  CHA2DS2-VASc Score = 7  This indicates a 11.2% annual risk of stroke. The patient's score is based upon: CHF History: 1 HTN History: 1 Diabetes History: 1 Stroke History: 2 Vascular Disease History: 1 Age Score: 1 Gender Score: 0  For questions or updates, please contact [facility] Please consult [email or web address] for contact info under  Signed, Thomasene Ripple, DO  08/03/2022 1:14 PM   Patient seen and examined, note reviewed with the signed Advanced Practice Provider. I personally reviewed laboratory  data, imaging studies and relevant notes. I independently examined the patient and formulated the important aspects of the plan. I have personally discussed the plan with the patient and/or family. Comments or changes to the note/plan are indicated below.  Patient seen and examined at his bedside.  His inpatient city was by the bedside.  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 We will reassess the need for continued IV dosing Lasix after we reassess this.   Know depressed ejection since 02/2022 with EF 30-35% and wall motion abnormalities. Plan is to invest in GDMT as blood pressure and clinical conditions can tolerate.   He currently does not have any angina symptoms,no plans for an ischemic evaluation at this time and overall with his advanced dementia and is not easily redirected and this is a relative contraindication for a successful ischemic evaluation.   Continue lipid lowering agent, and on DOAC so aspirin not on board.   In a-fib, continue rate limiting agent: Toprol XL 12.5 mg daily and Eliquis 5 mg twice daily.   Thomasene Ripple DO, MS [facility] Attending Cardiologist [facility]  [address] [place], [place] [address] [phone] Website: [email or web address]

## 2022-08-03 NOTE — Care Management Obs Status (Signed)
MEDICARE OBSERVATION STATUS NOTIFICATION   Patient Details  Name: Mark Cabrera MRN: [id number] Date of Birth: 1950-05-19   Medicare Observation Status Notification Given:  Yes    MahabirOlegario Messier, RN 08/03/2022, 11:04 AM

## 2022-08-03 NOTE — Evaluation (Signed)
Occupational Therapy Evaluation Patient Details Name: Mark Cabrera MRN: [id number] DOB: November 29, 1950 Today's Date: 08/03/2022   History of Present Illness Mark Cabrera is a 72 yr old male brought to the hospital from a nursing facility with behavioral disturbance. He was found to have acute on chronic heart failure. PMH: combined systolic & diastolic heart failure, dementia, OA of the knee, CVA, a fib, CAD, AVR   Clinical Impression   The pt required some initial encouragement to participate in the session. He required supervision to min guard assist for assessed activities, including supine to sit, simulated lower body dressing, sit to stand, and ambulating in the hall using a RW. He was noted to present with shortness of breath with progressive activity and his O2 saturation was noted to be 86% on room air with ambulation. He will benefit from further OT services to maximize his safety and independence with self-care tasks.      Recommendations for follow up therapy are one component of a multi-disciplinary discharge planning process, led by the attending physician.  Recommendations may be updated based on patient status, additional functional criteria and insurance authorization.   Assistance Recommended at Discharge Intermittent Supervision/Assistance  Patient can return home with the following A little help with walking and/or transfers;A little help with bathing/dressing/bathroom;Direct supervision/assist for medications management    Functional Status Assessment  Patient has had a recent decline in their functional status and demonstrates the ability to make significant improvements in function in a reasonable and predictable amount of time.  Equipment Recommendations  None recommended by OT       Precautions / Restrictions Precautions Precautions: Fall Restrictions Weight Bearing Restrictions: No      Mobility Bed Mobility Overal bed mobility: Needs Assistance Bed Mobility:  Supine to Sit     Supine to sit: Supervision          Transfers Overall transfer level: Needs assistance Equipment used: Rolling walker (2 wheels) Transfers: Sit to/from Stand Sit to Stand: Min guard                  Balance     Sitting balance-Leahy Scale: Good         Standing balance comment: Min guard using a RW                           ADL either performed or assessed with clinical judgement   ADL Overall ADL's : Needs assistance/impaired Eating/Feeding: Independent;Sitting Eating/Feeding Details (indicate cue type and reason): based on clinical judgement Grooming: Min guard;Standing Grooming Details (indicate cue type and reason): at sink level, based on clinical judgement         Upper Body Dressing : Sitting;Supervision/safety;Set up Upper Body Dressing Details (indicate cue type and reason): based on clincal judgement Lower Body Dressing: Min guard Lower Body Dressing Details (indicate cue type and reason): based on clinical judgement                                  Pertinent Vitals/Pain Pain Assessment Pain Location: He did not quantify his pain, but indicated having some pain in his "gonads" Pain Intervention(s):  (OT informed the pt's nurse)     Hand Dominance Right   Extremity/Trunk Assessment Upper Extremity Assessment Upper Extremity Assessment: Overall WFL for tasks assessed   Lower Extremity Assessment Lower Extremity Assessment: Overall WFL for tasks assessed  Communication Communication Communication: No difficulties   Cognition Arousal/Alertness: Awake/alert   Overall Cognitive Status: History of cognitive impairments - at baseline Area of Impairment: Orientation, Safety/judgement            General Comments: Oriented to person and being in the hospital. Disoriented to time. Able to follow 1-2 step commands                Home Living Family/patient expects to be discharged to::  Skilled nursing facility                Additional Comments: he was admitted from [facility] SNF      Prior Functioning/Environment                 ADLs Comments: The pt did not provide many details regarding his prior level of functioning, except for stating he is able to ambulate and has had a recent fall.  He also stated he was receiving therapy at the SNF.         OT Problem List: Impaired balance (sitting and/or standing);Decreased cognition;Decreased safety awareness;Decreased knowledge of use of DME or AE;Cardiopulmonary status limiting activity      OT Treatment/Interventions: Self-care/ADL training;Therapeutic exercise;Energy conservation;DME and/or AE instruction;Therapeutic activities;Cognitive remediation/compensation;Patient/family education    OT Goals(Current goals can be found in the care plan section) Acute Rehab OT Goals OT Goal Formulation: With patient Time For Goal Achievement: 08/17/22 Potential to Achieve Goals: Good ADL Goals Pt Will Perform Grooming: with supervision;standing Pt Will Perform Lower Body Dressing: with supervision;sit to/from stand Pt Will Transfer to Toilet: with supervision;ambulating Pt Will Perform Toileting - Clothing Manipulation and hygiene: with supervision;sit to/from stand  OT Frequency: Min 1X/week    Co-evaluation PT/OT/SLP Co-Evaluation/Treatment: Yes Reason for Co-Treatment: For patient/therapist safety;To address functional/ADL transfers PT goals addressed during session: Mobility/safety with mobility OT goals addressed during session: ADL's and self-care      AM-PAC OT "6 Clicks" Daily Activity     Outcome Measure Help from another person eating meals?: None Help from another person taking care of personal grooming?: A Little Help from another person toileting, which includes using toliet, bedpan, or urinal?: A Little Help from another person bathing (including washing, rinsing, drying)?: A Little Help  from another person to put on and taking off regular upper body clothing?: None Help from another person to put on and taking off regular lower body clothing?: A Little 6 Click Score: 20   End of Session Equipment Utilized During Treatment: Gait belt;Rolling walker (2 wheels) Nurse Communication: Mobility status  Activity Tolerance: Patient tolerated treatment well Patient left: in chair;with call bell/phone within reach;with chair alarm set  OT Visit Diagnosis: Unsteadiness on feet (R26.81)                Time: 1610-9604 OT Time Calculation (min): 14 min Charges:  OT General Charges $OT Visit: 1 Visit OT Evaluation $OT Eval Low Complexity: 1 Low    Geanette Buonocore L Daisuke Bailey, OTR/L 08/03/2022, 12:24 PM

## 2022-08-03 NOTE — Progress Notes (Addendum)
PROGRESS NOTE  Mark Cabrera:[id number] DOB: 1950/09/17 DOA: 08/02/2022 PCP: Evlyn Kanner, MD   LOS: 0 days   Brief Narrative / Interim history: 72 year old male with chronic combined systolic and diastolic CHF, dementia comes into the hospital with complaints of behavioral disturbances from his nursing home, as well as suspected acute on chronic combined CHF.  Patient has been having several ED visits in the last several days due to reported increased behavioral disturbances, but in the ER as well as being calm.  Due to multiple visits he has missed his scheduled medications for his CHF. Concerningly, per prior documentation, EMS was reportedly told yesterday when he left his nursing home by the RN that "I do not care what you do with him, you can dump him in the left [employer] parking lot for all I care."   Subjective / 24h Interval events: Sleepy this morning, but wakes up when prompted, has no complaints.  Assesement and Plan: Principal Problem:   Acute on chronic combined systolic and diastolic CHF (congestive heart failure) (HCC) Active Problems:   Dyslipidemia   Chronic diastolic heart failure (HCC)   CAD (coronary artery disease), native coronary artery   Paroxysmal atrial fibrillation (HCC)   Aggressive behavior   Dementia with behavioral disturbance (HCC)   Acute systolic (congestive) heart failure (HCC)   Principal problem Acute on chronic combined CHF -he has some congestion on the chest x-ray, received IV Lasix, repeat today.  BNP is elevated.  Most recent 2D echo was done in December 23 showing LVEF 30-35%, RV systolic function was also moderately reduced PA pressure was mildly elevated at 38.2 mmHg.  Cardiology consulted, appreciate input  Active problems Dementia, with probable behavioral disturbances -no behavioral issues since being here, continue to closely monitor.  Aricept has been discontinued by his neurologist, continue BuSpar, Depakote, Lexapro,  Seroquel  PAF -continue Eliquis, metoprolol  Essential hypertension -continue metoprolol  Hypokalemia -replace potassium and continue to monitor  Anemia -no bleeding, monitor  Hyponatremia -mild, in the setting of CHF.  Monitor  Scheduled Meds:  apixaban  5 mg Oral BID   busPIRone  7.5 mg Oral BID   divalproex  250 mg Oral BID   docusate sodium  100 mg Oral BID   escitalopram  10 mg Oral Daily   furosemide  40 mg Intravenous Once   insulin aspart  0-15 Units Subcutaneous TID WC   insulin aspart  0-5 Units Subcutaneous QHS   metoprolol succinate  12.5 mg Oral Daily   potassium chloride  40 mEq Oral Once   QUEtiapine  75 mg Oral QHS   spironolactone  12.5 mg Oral Daily   valACYclovir  1,000 mg Oral BID   Continuous Infusions:  magnesium sulfate bolus IVPB     PRN Meds:.acetaminophen **OR** acetaminophen, albuterol, ipratropium-albuterol, ondansetron **OR** ondansetron (ZOFRAN) IV, polyethylene glycol, traZODone  Current Outpatient Medications  Medication Instructions   Accu-Chek Softclix Lancets lancets Use as instructed   acetaminophen (TYLENOL) 650 mg, Oral, Every 4 hours PRN   Alirocumab (PRALUENT) 75 MG/ML SOAJ 1 Pen, Subcutaneous, Every 14 days   apixaban (ELIQUIS) 5 MG TABS tablet Take 1 tablet by mouth twice daily   busPIRone (BUSPAR) 7.5 mg, Oral, 2 times daily   clotrimazole (LOTRIMIN) 1 % cream Topical   cyanocobalamin (VITAMIN B12) 100 mcg, Oral, Daily   divalproex (DEPAKOTE) 125 mg, Oral, 2 times daily   donepezil (ARICEPT) 5 mg, Oral, Daily at bedtime   escitalopram (LEXAPRO) 10 mg, Oral,  Daily   fluconazole (DIFLUCAN) 150 mg, Oral, Every 3 DAYS   furosemide (LASIX) 40 mg, Oral, 2 times daily   glucose blood (ACCU-CHEK GUIDE) test strip Use as instructed   insulin aspart (NOVOLOG FLEXPEN) 100 UNIT/ML FlexPen INJECT 5 UNITS UNDER THE SKIN THREE TIMES DAILY WITH MEALS   insulin glargine-yfgn (SEMGLEE) 26 Units, Subcutaneous, Daily at bedtime   Insulin Pen  Needle 31G X 5 MM MISC Use one pen needle three times daily. Dx E11.49   ipratropium-albuterol (DUONEB) 0.5-2.5 (3) MG/3ML SOLN 3 mLs, Inhalation, Every 6 hours PRN   levofloxacin (LEVAQUIN) 500 mg, Oral, Daily   metFORMIN (GLUCOPHAGE) 500 mg, Oral, 2 times daily with meals   metoprolol succinate (TOPROL-XL) 12.5 mg, Oral, Daily   polyethylene glycol (MIRALAX / GLYCOLAX) 17 g, Oral, Daily   potassium chloride SA (KLOR-CON M) 20 MEQ tablet One po bid x 3 days, then one po once a day   QUEtiapine (SEROQUEL) 50 mg, Oral, Daily at bedtime   Repatha 140 mg, Subcutaneous, Every 14 days   spironolactone (ALDACTONE) 12.5 mg, Oral, Daily   valACYclovir (VALTREX) 1,000 mg, Oral, 2 times daily    Diet Orders (From admission, onward)     Start     Ordered   08/02/22 1437  Diet heart healthy/carb modified Room service appropriate? Yes; Fluid consistency: Thin  Diet effective now       Question Answer Comment  Diet-HS Snack? Nothing   Room service appropriate? Yes   Fluid consistency: Thin      08/02/22 1437            DVT prophylaxis: SCDs Start: 08/02/22 1436 apixaban (ELIQUIS) tablet 5 mg   Lab Results  Component Value Date   PLT 205 08/03/2022      Code Status: DNR  Family Communication: called daughter, left nessage  Status is: Observation The patient will require care spanning > 2 midnights and should be moved to inpatient because: IV Lasix  Level of care: Telemetry  Consultants:  Cardiology  Objective: Vitals:   08/02/22 1827 08/02/22 2209 08/03/22 0158 08/03/22 0540  BP: 116/84 112/73 116/77 101/82  Pulse: 84 82 78 83  Resp: 20 18 18 18   Temp: 97.7 F (36.5 C) (!) 97.4 F (36.3 C) 97.8 F (36.6 C) 97.7 F (36.5 C)  TempSrc: Oral Oral Oral Oral  SpO2: 100%  100% 100%  Weight:      Height:        Intake/Output Summary (Last 24 hours) at 08/03/2022 1016 Last data filed at 08/03/2022 1000 Gross per 24 hour  Intake 460 ml  Output 1100 ml  Net -640 ml   Wt  Readings from Last 3 Encounters:  08/02/22 78.6 kg  08/01/22 85.2 kg  07/28/22 85.2 kg    Examination:  Constitutional: NAD Eyes: no scleral icterus ENMT: Mucous membranes are moist.  Neck: normal, supple Respiratory: clear to auscultation bilaterally, no wheezing, no crackles. Normal respiratory effort. No accessory muscle use.  Cardiovascular: Regular rate and rhythm, no murmurs / rubs / gallops. No LE edema.  Abdomen: non distended, no tenderness. Bowel sounds positive.  Musculoskeletal: no clubbing / cyanosis.   Data Reviewed: I have independently reviewed following labs and imaging studies   CBC Recent Labs  Lab 07/28/22 2210 08/01/22 1930 08/03/22 0346  WBC 3.4* 2.9* 2.5*  HGB 10.0* 9.9* 8.9*  HCT 30.2* 30.7* 27.4*  PLT 338 225 205  MCV 97.4 100.0 98.6  MCH 32.3 32.2 32.0  MCHC 33.1  32.2 32.5  RDW 16.4* 17.0* 16.9*  LYMPHSABS 0.8  --   --   MONOABS 0.1  --   --   EOSABS 0.0  --   --   BASOSABS 0.0  --   --     Recent Labs  Lab 07/28/22 2210 08/01/22 1930 08/02/22 1338 08/02/22 1611 08/03/22 0346  NA 132* 135 132*  --  134*  K 3.0* 3.1* 4.1  --  3.3*  CL 100 104 99  --  103  CO2 18* 18* 19*  --  21*  GLUCOSE 260* 188* 287*  --  164*  BUN 23 28* 31*  --  31*  CREATININE 1.24 1.09 1.28*  --  1.11  CALCIUM 8.5* 8.3* 8.4*  --  8.2*  AST 22 22  --   --   --   ALT 14 14  --   --   --   ALKPHOS 75 77  --   --   --   BILITOT 0.8 0.9  --   --   --   ALBUMIN 3.4* 3.3*  --   --   --   MG  --   --  1.5*  --   --   HGBA1C  --   --   --  8.5*  --   BNP 2,006.3* 1,518.8* 1,451.9*  --   --     ------------------------------------------------------------------------------------------------------------------ No results for input(s): "CHOL", "HDL", "LDLCALC", "TRIG", "CHOLHDL", "LDLDIRECT" in the last 72 hours.  Lab Results  Component Value Date   HGBA1C 8.5 (H) 08/02/2022    ------------------------------------------------------------------------------------------------------------------ No results for input(s): "TSH", "T4TOTAL", "T3FREE", "THYROIDAB" in the last 72 hours.  Invalid input(s): "FREET3"  Cardiac Enzymes No results for input(s): "CKMB", "TROPONINI", "MYOGLOBIN" in the last 168 hours.  Invalid input(s): "CK" ------------------------------------------------------------------------------------------------------------------    Component Value Date/Time   BNP 1,451.9 (H) 08/02/2022 1338    CBG: Recent Labs  Lab 07/28/22 2122 08/02/22 1604 08/02/22 2236 08/03/22 0726  GLUCAP 278* 233* 163* 164*    Recent Results (from the past 240 hour(s))  MRSA Next Gen by PCR, Nasal     Status: Abnormal   Collection Time: 08/02/22  4:19 PM   Specimen: Nasal Mucosa; Nasal Swab  Result Value Ref Range Status   MRSA by PCR Next Gen DETECTED (A) NOT DETECTED Final    Comment: (NOTE) The GeneXpert MRSA Assay (FDA approved for NASAL specimens only), is one component of a comprehensive MRSA colonization surveillance program. It is not intended to diagnose MRSA infection nor to guide or monitor treatment for MRSA infections. Test performance is not FDA approved in patients less than 31 years old. Performed at [facility], [address], [place], [place] [address]      Radiology Studies: [facility] Chest Port 1 View  Result Date: 08/02/2022 CLINICAL DATA:  Shortness of breath. EXAM: PORTABLE CHEST 1 VIEW COMPARISON:  08/01/2022 and CT chest 02/27/2022. FINDINGS: Trachea is midline. Heart is enlarged. Left atrial appendage clip. Aortic valve replacement. There may be retrocardiac opacification. Difficult to exclude a small left pleural effusion. No interstitial prominence and indistinctness. IMPRESSION: 1. Suspect developing left lower lobe airspace opacification which may be due to atelectasis or pneumonia. 2. Small left pleural effusion.  Electronically Signed   By: Leanna Battles M.D.   On: 08/02/2022 14:40     Pamella Pert, MD, PhD [facility]  Between 7 am - 7 pm I am available, please contact me via Amion (for emergencies)  or Securechat (non urgent messages)  Between 7 pm - 7 am I am not available, please contact night coverage MD/APP via Amion

## 2022-08-03 NOTE — TOC Initial Note (Addendum)
Transition of Care [facility]) - Initial/Assessment Note    Patient Details  Name: Mark Cabrera MRN: [id number] Date of Birth: February 23, 1951  Transition of Care [facility]) CM/SW Contact:    Howell Rucks, RN Phone Number: 08/03/2022, 10:25 AM  Clinical Narrative:  [facility] referral for SNF placement. NCM called to [facility], rep-Janie, confirmed pt from LTC, facility does not have a locked memory care unit. Janie recommended contacting facility administrator, Alinda Money at [phone], for approval to return, unable to leave voicemail (full). Telephone to [facility], Occupational psychologist at [place], confirm pt  alert and oriented x 3 at baseline, ambulates independently with a walker, independent with ADL's, episodes of combativeness, inconsistent with taking medications.  Note Psych eval-recommended IOP, provided resources on dc instructions. Pt currently has sitter 1:1. Call to dtr [name]) left vm await call back. Also call to brother Leavy Cella) line busy, unable to leave a message. Will continue to monitor.   -10:58amTOC/PT recommendation for short term SNF.  Team informed (MD, PT) via teams chat pt resides in LTC unit at [place], dc plan to return at discharge, facility is assisting with Memory Care Unit placement. MD confirmed anticipate dc Friday or Saturday.      Barriers to Discharge: No Barriers Identified   Patient Goals and CMS Choice            Expected Discharge Plan and Services                                              Prior Living Arrangements/Services                       Activities of Daily Living Home Assistive Devices/Equipment: Dan Humphreys (specify type) ADL Screening (condition at time of admission) Patient's cognitive ability adequate to safely complete daily activities?: Yes Is the patient deaf or have difficulty hearing?: No Does the patient have difficulty seeing, even when wearing glasses/contacts?: No Does the patient have difficulty concentrating, remembering, or  making decisions?: Yes Patient able to express need for assistance with ADLs?: Yes Does the patient have difficulty dressing or bathing?: Yes Independently performs ADLs?: No Communication: Independent Dressing (OT): Needs assistance Is this a change from baseline?: Pre-admission baseline Grooming: Needs assistance Is this a change from baseline?: Pre-admission baseline Feeding: Independent Bathing: Needs assistance Is this a change from baseline?: Pre-admission baseline Toileting: Needs assistance Is this a change from baseline?: Pre-admission baseline In/Out Bed: Needs assistance Is this a change from baseline?: Pre-admission baseline Walks in Home: Needs assistance Is this a change from baseline?: Pre-admission baseline Does the patient have difficulty walking or climbing stairs?: Yes Weakness of Legs: Both Weakness of Arms/Hands: Both  Permission Sought/Granted                  Emotional Assessment              Admission diagnosis:  Shortness of breath [R06.02] Chronic atrial fibrillation (HCC) [I48.20] Elevated brain natriuretic peptide (BNP) level [R79.89] Acute on chronic combined systolic and diastolic CHF (congestive heart failure) (HCC) [I50.43] Chronic dementia, with behavioral disturbance (HCC) [F03.918] Acute systolic (congestive) heart failure (HCC) [I50.21] Patient Active Problem List   Diagnosis Date Noted   Acute on chronic combined systolic and diastolic CHF (congestive heart failure) (HCC) 08/02/2022   Dementia with behavioral disturbance (HCC) 08/02/2022   Acute systolic (congestive) heart  failure (HCC) 08/02/2022   Palliative care encounter 04/08/2022   Hypomagnesemia 04/08/2022   Aggressive behavior 03/18/2022   Acute exacerbation of CHF (congestive heart failure) (HCC) 02/28/2022   Acute venous stasis dermatitis of both lower extremities 02/27/2022   Acute on chronic diastolic heart failure with preserved ejection fraction (HCC) 02/26/2022    Pressure injury of skin 01/24/2022   Hyperglycemia 01/17/2022   Neutropenia (HCC)    Fever, unspecified    Malnutrition of moderate degree 09/23/2021   Physical deconditioning 09/21/2021   Otitis externa 12/31/2020   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   Balanitis 11/19/2020   Complicated urinary tract infection    Acute kidney injury (HCC)    Thrombocytopenia (HCC)    Hyponatremia 11/08/2020   Left shoulder pain 10/20/2020   Dyspnea 09/23/2020   Cognitive impairment with history of delirium 08/25/2020   Hip pain, chronic, left 07/05/2020   Epididymitis 04/14/2020   Fatigue 03/18/2020   Hypotension 03/10/2020   Abdominal wall bulge 12/03/2019   Dermoid inclusion cyst 08/07/2019   Chronic left shoulder pain 05/13/2019   Depression 11/05/2018   Blurry vision, bilateral 09/20/2018   CVA (cerebral vascular accident) (HCC) 06/28/2018   Hepatic cirrhosis (HCC) 06/29/2017   Healthcare maintenance 12/16/2016   Lipoma of left upper extremity 09/12/2016   Chronic knee pain 07/04/2016   Decreased visual acuity 07/04/2016   Pleural effusion, left 04/28/2016   Paroxysmal atrial fibrillation (HCC) 04/16/2016   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/31/2016   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   CAD (coronary artery disease), native coronary artery    Chronic diastolic heart failure (HCC)    Sleep apnea 08/02/2009   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   Dyslipidemia 05/18/2006   Hypertensive heart disease    PCP:  Evlyn Kanner, MD Pharmacy:   [facility] - [place], [place] - [address] [place] [address] Phone: [phone] Fax: [phone]  [id number] [place], [place] - [address] AT [facility] [address] [place] [address] Phone: [phone] Fax: [phone]     Social Determinants of Health (SDOH) Social History: SDOH Screenings    Food Insecurity: No Food Insecurity (08/02/2022)  Housing: Low Risk  (08/02/2022)  Transportation Needs: No Transportation Needs (08/02/2022)  Utilities: Not At Risk (08/02/2022)  Alcohol Screen: Low Risk  (01/18/2022)  Depression (PHQ2-9): Low Risk  (01/18/2022)  Financial Resource Strain: Medium Risk (01/18/2022)  Physical Activity: Inactive (01/18/2022)  Social Connections: Moderately Isolated (01/18/2022)  Stress: Stress Concern Present (01/18/2022)  Tobacco Use: Low Risk  (08/02/2022)   SDOH Interventions:     Readmission Risk Interventions     No data to display

## 2022-08-03 NOTE — Evaluation (Signed)
Physical Therapy Evaluation Patient Details Name: Mark Cabrera MRN: [id number] DOB: 09/21/1950 Today's Date: 08/03/2022  History of Present Illness  [name] is a 72 yr old male brought to the hospital from a nursing facility with behavioral disturbance. He was found to have acute on chronic heart failure. PMH: combined systolic & diastolic heart failure, dementia, OA of the knee, CVA, a fib, CAD, AVR  Clinical Impression  Pt admitted with above diagnosis.  Pt currently with functional limitations due to the deficits listed below (see PT Problem List). Pt will benefit from acute skilled PT to increase their independence and safety with mobility to allow discharge.     The patient  did ambulate x 180' with RW. Patient unable to provide PLOF at SNF.   SPO2 875 on RA during ambulation, 965  on 2 LPM. Pt admitted with above diagnosis.  Pt currently with functional limitations due to the deficits listed below (see PT Problem List). Pt will benefit from acute skilled PT to increase their independence and safety with mobility to allow discharge.        Recommendations for follow up therapy are one component of a multi-disciplinary discharge planning process, led by the attending physician.  Recommendations may be updated based on patient status, additional functional criteria and insurance authorization.  Follow Up Recommendations Can patient physically be transported by private vehicle: No     Assistance Recommended at Discharge Frequent or constant Supervision/Assistance  Patient can return home with the following  A little help with walking and/or transfers;A little help with bathing/dressing/bathroom;Assistance with cooking/housework;Assist for transportation    Equipment Recommendations None recommended by PT  Recommendations for Other Services       Functional Status Assessment Patient has had a recent decline in their functional status and demonstrates the ability to make significant  improvements in function in a reasonable and predictable amount of time.     Precautions / Restrictions Precautions Precautions: Fall Restrictions Weight Bearing Restrictions: No      Mobility  Bed Mobility   Bed Mobility: Supine to Sit     Supine to sit: Supervision          Transfers Overall transfer level: Needs assistance Equipment used: Rolling walker (2 wheels) Transfers: Sit to/from Stand Sit to Stand: Min guard                Ambulation/Gait Ambulation/Gait assistance: Min guard Gait Distance (Feet): 140 Feet Assistive device: Rolling walker (2 wheels) Gait Pattern/deviations: Step-through pattern Gait velocity: decr     General Gait Details: slow cadence  Stairs            Wheelchair Mobility    Modified Rankin (Stroke Patients Only)       Balance Overall balance assessment: Needs assistance, History of Falls   Sitting balance-Leahy Scale: Good     Standing balance support: Bilateral upper extremity supported, During functional activity, Reliant on assistive device for balance Standing balance-Leahy Scale: Fair                               Pertinent Vitals/Pain Pain Assessment Faces Pain Scale: Hurts little more Pain Location: He did not quantify his pain, but indicated having some pain in his "gonads"    Home Living Family/patient expects to be discharged to:: Skilled nursing facility                   Additional Comments: he  was admitted from [facility]    Prior Function               Mobility Comments: Pt able to state a fall while walking with PT at the SNF., reports that he uses a RW, unsure if he requires support ADLs Comments: The pt did not provide many details regarding his prior level of functioning,     Hand Dominance   Dominant Hand: Right    Extremity/Trunk Assessment   Upper Extremity Assessment Upper Extremity Assessment: Overall WFL for tasks assessed    Lower  Extremity Assessment Lower Extremity Assessment: Generalized weakness    Cervical / Trunk Assessment Cervical / Trunk Assessment: Normal  Communication   Communication: No difficulties  Cognition Arousal/Alertness: Awake/alert   Overall Cognitive Status: History of cognitive impairments - at baseline Area of Impairment: Orientation, Safety/judgement                               General Comments: Oriented to person and being in the hospital. Disoriented to time. Able to follow 1-2 step commands        General Comments      Exercises     Assessment/Plan    PT Assessment Patient needs continued PT services  PT Problem List Decreased strength;Decreased activity tolerance;Decreased mobility;Decreased cognition;Decreased safety awareness;Decreased balance;Pain       PT Treatment Interventions DME instruction;Therapeutic activities;Cognitive remediation;Gait training;Functional mobility training;Patient/family education    PT Goals (Current goals can be found in the Care Plan section)  Acute Rehab PT Goals Patient Stated Goal: agreed to walk PT Goal Formulation: Patient unable to participate in goal setting Time For Goal Achievement: 08/17/22 Potential to Achieve Goals: Fair    Frequency Min 1X/week     Co-evaluation PT/OT/SLP Co-Evaluation/Treatment: Yes Reason for Co-Treatment: For patient/therapist safety;To address functional/ADL transfers PT goals addressed during session: Mobility/safety with mobility OT goals addressed during session: ADL's and self-care       AM-PAC PT "6 Clicks" Mobility  Outcome Measure Help needed turning from your back to your side while in a flat bed without using bedrails?: A Little Help needed moving from lying on your back to sitting on the side of a flat bed without using bedrails?: A Little Help needed moving to and from a bed to a chair (including a wheelchair)?: A Little Help needed standing up from a chair using  your arms (e.g., wheelchair or bedside chair)?: A Little Help needed to walk in hospital room?: A Little Help needed climbing 3-5 steps with a railing? : A Lot 6 Click Score: 17    End of Session Equipment Utilized During Treatment: Gait belt Activity Tolerance: Patient limited by fatigue Patient left: with chair alarm set;with nursing/sitter in room;in chair Nurse Communication: Mobility status PT Visit Diagnosis: Unsteadiness on feet (R26.81);Repeated falls (R29.6);History of falling (Z91.81);Difficulty in walking, not elsewhere classified (R26.2);Pain    Time: 6578-4696 PT Time Calculation (min) (ACUTE ONLY): 14 min   Charges:   PT Evaluation $PT Eval Low Complexity: 1 Low          Blanchard Kelch PT Acute Rehabilitation Services Office [phone] Weekend pager-[phone]   Rada Hay 08/03/2022, 1:32 PM

## 2022-08-04 ENCOUNTER — Non-Acute Institutional Stay: Payer: Medicare Other | Admitting: Family Medicine

## 2022-08-04 DIAGNOSIS — I5043 Acute on chronic combined systolic (congestive) and diastolic (congestive) heart failure: Secondary | ICD-10-CM | POA: Diagnosis not present

## 2022-08-04 LAB — CBC
HCT: 31.1 % — ABNORMAL LOW (ref 39.0–52.0)
Hemoglobin: 9.8 g/dL — ABNORMAL LOW (ref 13.0–17.0)
MCH: 32.8 pg (ref 26.0–34.0)
MCHC: 31.5 g/dL (ref 30.0–36.0)
MCV: 104 fL — ABNORMAL HIGH (ref 80.0–100.0)
Platelets: 206 10*3/uL (ref 150–400)
RBC: 2.99 MIL/uL — ABNORMAL LOW (ref 4.22–5.81)
RDW: 17.2 % — ABNORMAL HIGH (ref 11.5–15.5)
WBC: 2.7 10*3/uL — ABNORMAL LOW (ref 4.0–10.5)
nRBC: 0 % (ref 0.0–0.2)

## 2022-08-04 LAB — GLUCOSE, CAPILLARY
Glucose-Capillary: 183 mg/dL — ABNORMAL HIGH (ref 70–99)
Glucose-Capillary: 240 mg/dL — ABNORMAL HIGH (ref 70–99)
Glucose-Capillary: 204 mg/dL — ABNORMAL HIGH (ref 70–99)
Glucose-Capillary: 226 mg/dL — ABNORMAL HIGH (ref 70–99)
Glucose-Capillary: 123 mg/dL — ABNORMAL HIGH (ref 70–99)

## 2022-08-04 LAB — BASIC METABOLIC PANEL
Anion gap: 12 (ref 5–15)
BUN: 33 mg/dL — ABNORMAL HIGH (ref 8–23)
CO2: 18 mmol/L — ABNORMAL LOW (ref 22–32)
Calcium: 8.3 mg/dL — ABNORMAL LOW (ref 8.9–10.3)
Chloride: 104 mmol/L (ref 98–111)
Creatinine, Ser: 1.12 mg/dL (ref 0.61–1.24)
GFR, Estimated: >60 mL/min (ref >60)
Glucose, Bld: 198 mg/dL — ABNORMAL HIGH (ref 70–99)
Potassium: 4.2 mmol/L (ref 3.5–5.1)
Sodium: 134 mmol/L — ABNORMAL LOW (ref 135–145)

## 2022-08-04 LAB — MAGNESIUM: Magnesium: 1.8 mg/dL (ref 1.7–2.4)

## 2022-08-04 LAB — BRAIN NATRIURETIC PEPTIDE: B Natriuretic Peptide: 1284.8 pg/mL — ABNORMAL HIGH (ref 0.0–100.0)

## 2022-08-04 MED ORDER — FUROSEMIDE 40 MG PO TABS
40.0000 mg | ORAL_TABLET | Freq: Every day | ORAL | Status: DC
  Administered 2022-08-04 – 2022-08-09 (×6): 40 mg via ORAL
  Filled 2022-08-04 (×6): qty 1
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
## 2022-08-04 NOTE — TOC Initial Note (Deleted)
Transition of Care [facility]) - Initial/Assessment Note    Patient Details  Name: Mark Cabrera MRN: [id number] Date of Birth: 1951/02/11  [facility]) CM/SW Contact:    Howell Rucks, RN Phone Number: 08/04/2022, 10:06 AM  Clinical Narrative:   NCM called to pt's dtr [name], legal guardian), no answer, voicemail left with NCM name and phone number requesting call back.                  Barriers to Discharge: No Barriers Identified   Patient Goals and CMS Choice            Expected Discharge Plan and Services                                              Prior Living Arrangements/Services                       Activities of Daily Living Home Assistive Devices/Equipment: Dan Humphreys (specify type) ADL Screening (condition at time of admission) Patient's cognitive ability adequate to safely complete daily activities?: Yes Is the patient deaf or have difficulty hearing?: No Does the patient have difficulty seeing, even when wearing glasses/contacts?: No Does the patient have difficulty concentrating, remembering, or making decisions?: Yes Patient able to express need for assistance with ADLs?: Yes Does the patient have difficulty dressing or bathing?: Yes Independently performs ADLs?: No Communication: Independent Dressing (OT): Needs assistance Is this a change from baseline?: Pre-admission baseline Grooming: Needs assistance Is this a change from baseline?: Pre-admission baseline Feeding: Independent Bathing: Needs assistance Is this a change from baseline?: Pre-admission baseline Toileting: Needs assistance Is this a change from baseline?: Pre-admission baseline In/Out Bed: Needs assistance Is this a change from baseline?: Pre-admission baseline Walks in Home: Needs assistance Is this a change from baseline?: Pre-admission baseline Does the patient have difficulty walking or climbing stairs?: Yes Weakness of Legs: Both Weakness of  Arms/Hands: Both  Permission Sought/Granted                  Emotional Assessment              Admission diagnosis:  Shortness of breath [R06.02] Chronic atrial fibrillation (HCC) [I48.20] Elevated brain natriuretic peptide (BNP) level [R79.89] Acute on chronic combined systolic and diastolic CHF (congestive heart failure) (HCC) [I50.43] Chronic dementia, with behavioral disturbance (HCC) [F03.918] Acute systolic (congestive) heart failure (HCC) [I50.21] Acute on chronic systolic CHF (congestive heart failure) (HCC) [I50.23] Patient Active Problem List   Diagnosis Date Noted   Acute on chronic systolic CHF (congestive heart failure) (HCC) 08/03/2022   Acute on chronic combined systolic and diastolic CHF (congestive heart failure) (HCC) 08/02/2022   Dementia with behavioral disturbance (HCC) 08/02/2022   Acute systolic (congestive) heart failure (HCC) 08/02/2022   Palliative care encounter 04/08/2022   Hypomagnesemia 04/08/2022   Aggressive behavior 03/18/2022   Acute exacerbation of CHF (congestive heart failure) (HCC) 02/28/2022   Acute venous stasis dermatitis of both lower extremities 02/27/2022   Acute on chronic diastolic heart failure with preserved ejection fraction (HCC) 02/26/2022   Pressure injury of skin 01/24/2022   Hyperglycemia 01/17/2022   Neutropenia (HCC)    Fever, unspecified    Malnutrition of moderate degree 09/23/2021   Physical deconditioning 09/21/2021   Otitis externa 12/31/2020   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   Balanitis 11/19/2020  Complicated urinary tract infection    Acute kidney injury (HCC)    Thrombocytopenia (HCC)    Hyponatremia 11/08/2020   Left shoulder pain 10/20/2020   Dyspnea 09/23/2020   Cognitive impairment with history of delirium 08/25/2020   Hip pain, chronic, left 07/05/2020   Epididymitis 04/14/2020   Fatigue 03/18/2020   Hypotension 03/10/2020   Abdominal wall bulge 12/03/2019   Dermoid inclusion cyst 08/07/2019    Chronic left shoulder pain 05/13/2019   Depression 11/05/2018   Blurry vision, bilateral 09/20/2018   CVA (cerebral vascular accident) (HCC) 06/28/2018   Hepatic cirrhosis (HCC) 06/29/2017   Healthcare maintenance 12/16/2016   Lipoma of left upper extremity 09/12/2016   Chronic knee pain 07/04/2016   Decreased visual acuity 07/04/2016   Pleural effusion, left 04/28/2016   Paroxysmal atrial fibrillation (HCC) 04/16/2016   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/31/2016   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   CAD (coronary artery disease), native coronary artery    Chronic diastolic heart failure (HCC)    Sleep apnea 08/02/2009   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   Dyslipidemia 05/18/2006   Hypertensive heart disease    PCP:  Evlyn Kanner, MD Pharmacy:   [facility] - [place], [place] - [address] [place] [address] Phone: [phone] Fax: [phone]  [id number] [place], [place] - [address] [facility] [address] [place] [address] Phone: [phone] Fax: [phone]     Social Determinants of Health (SDOH) Social History: SDOH Screenings   Food Insecurity: No Food Insecurity (08/02/2022)  Housing: Low Risk  (08/02/2022)  Transportation Needs: No Transportation Needs (08/02/2022)  Utilities: Not At Risk (08/02/2022)  Alcohol Screen: Low Risk  (01/18/2022)  Depression (PHQ2-9): Low Risk  (01/18/2022)  Financial Resource Strain: Medium Risk (01/18/2022)  Physical Activity: Inactive (01/18/2022)  Social Connections: Moderately Isolated (01/18/2022)  Stress: Stress Concern Present (01/18/2022)  Tobacco Use: Low Risk  (08/02/2022)   SDOH Interventions:     Readmission Risk Interventions     No data to display

## 2022-08-04 NOTE — [place] FL2 (Signed)
[place] MEDICAID FL2 LEVEL OF CARE FORM     IDENTIFICATION  Patient Name: Mark Cabrera Birthdate: 1950-10-22 Sex: male Admission Date (Current Location): 08/02/2022  [place] and [place] Number:  [name] [id number] N Facility and Address:  [facility],  [address], [place] [address]      Provider Number: [id number]  Attending Physician Name and Address:  Leatha Gilding, MD  Relative Name and Phone Number:  Mark Cabrera  (dtr, legal guardian) [phone]    Current Level of Care: Hospital Recommended Level of Care: Memory Care Prior Approval Number:    Date Approved/Denied:   PASRR Number:    Discharge Plan: Other (Comment) (Memory Care)    Current Diagnoses: Patient Active Problem List   Diagnosis Date Noted   Acute on chronic systolic CHF (congestive heart failure) (HCC) 08/03/2022   Acute on chronic combined systolic and diastolic CHF (congestive heart failure) (HCC) 08/02/2022   Dementia with behavioral disturbance (HCC) 08/02/2022   Acute systolic (congestive) heart failure (HCC) 08/02/2022   Palliative care encounter 04/08/2022   Hypomagnesemia 04/08/2022   Aggressive behavior 03/18/2022   Acute exacerbation of CHF (congestive heart failure) (HCC) 02/28/2022   Acute venous stasis dermatitis of both lower extremities 02/27/2022   Acute on chronic diastolic heart failure with preserved ejection fraction (HCC) 02/26/2022   Pressure injury of skin 01/24/2022   Hyperglycemia 01/17/2022   Neutropenia (HCC)    Fever, unspecified    Malnutrition of moderate degree 09/23/2021   Physical deconditioning 09/21/2021   Otitis externa 12/31/2020   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   Balanitis 11/19/2020   Complicated urinary tract infection    Acute kidney injury (HCC)    Thrombocytopenia (HCC)    Hyponatremia 11/08/2020   Left shoulder pain 10/20/2020   Dyspnea 09/23/2020   Cognitive impairment with history of delirium 08/25/2020   Hip pain, chronic,  left 07/05/2020   Epididymitis 04/14/2020   Fatigue 03/18/2020   Hypotension 03/10/2020   Abdominal wall bulge 12/03/2019   Dermoid inclusion cyst 08/07/2019   Chronic left shoulder pain 05/13/2019   Depression 11/05/2018   Blurry vision, bilateral 09/20/2018   CVA (cerebral vascular accident) (HCC) 06/28/2018   Hepatic cirrhosis (HCC) 06/29/2017   Healthcare maintenance 12/16/2016   Lipoma of left upper extremity 09/12/2016   Chronic knee pain 07/04/2016   Decreased visual acuity 07/04/2016   Pleural effusion, left 04/28/2016   Paroxysmal atrial fibrillation (HCC) 04/16/2016   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/31/2016   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   CAD (coronary artery disease), native coronary artery    Chronic diastolic heart failure (HCC)    Sleep apnea 08/02/2009   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   Dyslipidemia 05/18/2006   Hypertensive heart disease     Orientation RESPIRATION BLADDER Height & Weight     Self  Normal Incontinent, External catheter Weight: 80 kg Height:  5\' 8"  (172.7 cm)  BEHAVIORAL SYMPTOMS/MOOD NEUROLOGICAL BOWEL NUTRITION STATUS  Other (Comment) (Agitation)   Incontinent Diet (heart healthy)  AMBULATORY STATUS COMMUNICATION OF NEEDS Skin   Limited Assist Verbally Normal                       Personal Care Assistance Level of Assistance  Bathing, Feeding, Dressing Bathing Assistance: Limited assistance Feeding assistance: Limited assistance Dressing Assistance: Limited assistance     Functional Limitations Info  Sight, Hearing, Speech Sight Info: Adequate Hearing Info: Adequate Speech Info: Adequate  SPECIAL CARE FACTORS FREQUENCY                       Contractures Contractures Info: Not present    Additional Factors Info  Code Status, Allergies, Psychotropic Code Status Info: DNR Allergies Info: Morpholine Salicylate, Penicillins, Morphine And Codeine, Sglt2 Inhibitors,  Statins Psychotropic Info: Buspar 7.5mg  po BID, Depakote DR 250mg  po BID, Lexapro 10mg  po daily, Ativan 0.5mg  prn. Seroquel 75mg  po daily, Desyrel po prn         Current Medications (08/04/2022):  This is the current hospital active medication list Current Facility-Administered Medications  Medication Dose Route Frequency Provider Last Rate Last Admin   acetaminophen (TYLENOL) tablet 650 mg  650 mg Oral Q6H PRN Kirby Crigler, Mir M, MD       Or   acetaminophen (TYLENOL) suppository 650 mg  650 mg Rectal Q6H PRN Kirby Crigler, Mir M, MD       albuterol (PROVENTIL) (2.5 MG/3ML) 0.083% nebulizer solution 2.5 mg  2.5 mg Nebulization Q2H PRN Kirby Crigler, Mir M, MD       apixaban Everlene Balls) tablet 5 mg  5 mg Oral BID Gloris Manchester, MD   5 mg at 08/04/22 1108   busPIRone (BUSPAR) tablet 7.5 mg  7.5 mg Oral BID Gloris Manchester, MD   7.5 mg at 08/04/22 1108   divalproex (DEPAKOTE) DR tablet 250 mg  250 mg Oral BID Oneta Rack, NP   250 mg at 08/04/22 1108   docusate sodium (COLACE) capsule 100 mg  100 mg Oral BID Kirby Crigler, Mir M, MD   100 mg at 08/04/22 1108   escitalopram (LEXAPRO) tablet 10 mg  10 mg Oral Daily Gloris Manchester, MD   10 mg at 08/04/22 1108   feeding supplement (ENSURE ENLIVE / ENSURE PLUS) liquid 237 mL  237 mL Oral BID BM Leatha Gilding, MD   237 mL at 08/04/22 1109   insulin aspart (novoLOG) injection 0-15 Units  0-15 Units Subcutaneous TID WC Kirby Crigler, Mir M, MD   5 Units at 08/04/22 1254   insulin aspart (novoLOG) injection 0-5 Units  0-5 Units Subcutaneous QHS Kirby Crigler, Mir M, MD       ipratropium-albuterol (DUONEB) 0.5-2.5 (3) MG/3ML nebulizer solution 3 mL  3 mL Inhalation Q6H PRN Gloris Manchester, MD       LORazepam (ATIVAN) tablet 0.5 mg  0.5 mg Oral Q6H PRN Leatha Gilding, MD   0.5 mg at 08/04/22 1127   metoprolol succinate (TOPROL-XL) 24 hr tablet 12.5 mg  12.5 mg Oral Daily Gloris Manchester, MD   12.5 mg at 08/04/22 1107   multivitamin with minerals tablet 1 tablet  1 tablet Oral  Daily Leatha Gilding, MD   1 tablet at 08/04/22 1108   ondansetron (ZOFRAN) tablet 4 mg  4 mg Oral Q6H PRN Kirby Crigler, Mir M, MD       Or   ondansetron [facility]) injection 4 mg  4 mg Intravenous Q6H PRN Kirby Crigler, Mir M, MD       polyethylene glycol (MIRALAX / GLYCOLAX) packet 17 g  17 g Oral Daily PRN Kirby Crigler, Mir M, MD       QUEtiapine (SEROQUEL) tablet 75 mg  75 mg Oral QHS Oneta Rack, NP   75 mg at 08/03/22 2147   spironolactone (ALDACTONE) tablet 12.5 mg  12.5 mg Oral Daily Gloris Manchester, MD   12.5 mg at 08/04/22 1108   traZODone (DESYREL) tablet 25 mg  25 mg Oral QHS PRN  Kirby Crigler, Mir M, MD   25 mg at 08/03/22 2302   valACYclovir (VALTREX) tablet 1,000 mg  1,000 mg Oral BID Gloris Manchester, MD   1,000 mg at 08/04/22 1108     Discharge Medications: Please see discharge summary for a list of discharge medications.  Relevant Imaging Results:  Relevant Lab Results:   Additional Information SSN [id number]  Howell Rucks, RN

## 2022-08-04 NOTE — Progress Notes (Addendum)
Progress Note  Patient Name: Mark Cabrera Date of Encounter: 08/04/2022  Primary Cardiologist: Verne Carrow, MD  Subjective   Patient resting comfortably in bed, lying flat without dyspnea or labored breathing. Denies any complaints but only answers with brief answers. Not oriented to place  Inpatient Medications    Scheduled Meds:  apixaban  5 mg Oral BID   busPIRone  7.5 mg Oral BID   divalproex  250 mg Oral BID   docusate sodium  100 mg Oral BID   escitalopram  10 mg Oral Daily   feeding supplement  237 mL Oral BID BM   insulin aspart  0-15 Units Subcutaneous TID WC   insulin aspart  0-5 Units Subcutaneous QHS   metoprolol succinate  12.5 mg Oral Daily   multivitamin with minerals  1 tablet Oral Daily   QUEtiapine  75 mg Oral QHS   spironolactone  12.5 mg Oral Daily   valACYclovir  1,000 mg Oral BID   Continuous Infusions:  PRN Meds: acetaminophen **OR** acetaminophen, albuterol, ipratropium-albuterol, LORazepam, ondansetron **OR** ondansetron (ZOFRAN) IV, polyethylene glycol, traZODone   Vital Signs    Vitals:   08/03/22 0540 08/03/22 1315 08/03/22 1948 08/04/22 0630  BP: 101/82 128/71 107/77 110/87  Pulse: 83 (!) 51 61 87  Resp: 18 16 16 16   Temp: 97.7 F (36.5 C) 97.6 F (36.4 C) 97.6 F (36.4 C) 98.5 F (36.9 C)  TempSrc: Oral Oral  Oral  SpO2: 100% 100% 99% 98%  Weight:      Height:        Intake/Output Summary (Last 24 hours) at 08/04/2022 0850 Last data filed at 08/04/2022 0645 Gross per 24 hour  Intake 960 ml  Output 1825 ml  Net -865 ml      08/02/2022    4:04 PM 08/02/2022    4:19 AM 08/01/2022    6:49 PM  Last 3 Weights  Weight (lbs) 173 lb 4.5 oz 187 lb 13.3 oz 187 lb 13.3 oz  Weight (kg) 78.6 kg 85.2 kg 85.2 kg     Telemetry    Atrial fib - Personally Reviewed  ECG    No new tracings - Personally Reviewed  Physical Exam   GEN: No acute distress.  HEENT: Normocephalic, atraumatic, sclera non-icteric. Neck: No JVD or  bruits. Cardiac: Irregularly irregular, rate controlled, no murmurs, rubs, or gallops.  Respiratory: Clear to auscultation bilaterally. Breathing is unlabored. GI: Soft, nontender, non-distended, BS +x 4. MS: no deformity. Extremities: No clubbing or cyanosis. No edema. Distal pedal pulses are 2+ and equal bilaterally. Neuro:  A+O to self only.  Psych: Calm affect  Labs    High Sensitivity Troponin:   Recent Labs  Lab 07/28/22 2210 08/02/22 1338  TROPONINIHS 37* 29*      Cardiac EnzymesNo results for input(s): "TROPONINI" in the last 168 hours. No results for input(s): "TROPIPOC" in the last 168 hours.   Chemistry Recent Labs  Lab 07/28/22 2210 08/01/22 1930 08/02/22 1338 08/03/22 0346  NA 132* 135 132* 134*  K 3.0* 3.1* 4.1 3.3*  CL 100 104 99 103  CO2 18* 18* 19* 21*  GLUCOSE 260* 188* 287* 164*  BUN 23 28* 31* 31*  CREATININE 1.24 1.09 1.28* 1.11  CALCIUM 8.5* 8.3* 8.4* 8.2*  PROT 7.7 7.3  --   --   ALBUMIN 3.4* 3.3*  --   --   AST 22 22  --   --   ALT 14 14  --   --  ALKPHOS 75 77  --   --   BILITOT 0.8 0.9  --   --   GFRNONAA >60 >60 59* >60  ANIONGAP 14 13 14 10      Hematology Recent Labs  Lab 07/28/22 2210 08/01/22 1930 08/03/22 0346  WBC 3.4* 2.9* 2.5*  RBC 3.10* 3.07* 2.78*  HGB 10.0* 9.9* 8.9*  HCT 30.2* 30.7* 27.4*  MCV 97.4 100.0 98.6  MCH 32.3 32.2 32.0  MCHC 33.1 32.2 32.5  RDW 16.4* 17.0* 16.9*  PLT 338 225 205    BNP Recent Labs  Lab 07/28/22 2210 08/01/22 1930 08/02/22 1338  BNP 2,006.3* 1,518.8* 1,451.9*     DDimer No results for input(s): "DDIMER" in the last 168 hours.   Radiology    DG Chest Port 1 View  Result Date: 08/02/2022 CLINICAL DATA:  Shortness of breath. EXAM: PORTABLE CHEST 1 VIEW COMPARISON:  08/01/2022 and CT chest 02/27/2022. FINDINGS: Trachea is midline. Heart is enlarged. Left atrial appendage clip. Aortic valve replacement. There may be retrocardiac opacification. Difficult to exclude a small left  pleural effusion. No interstitial prominence and indistinctness. IMPRESSION: 1. Suspect developing left lower lobe airspace opacification which may be due to atelectasis or pneumonia. 2. Small left pleural effusion. Electronically Signed   By: Leanna Battles M.D.   On: 08/02/2022 14:40    Cardiac Studies   2d echo 02/2022  1. Left ventricular ejection fraction, by estimation, is 30 to 35%. The  left ventricle has moderately decreased function. The left ventricle  demonstrates regional wall motion abnormalities (see scoring  diagram/findings for description). Left ventricular   diastolic function could not be evaluated.   2. Right ventricular systolic function is moderately reduced. The right  ventricular size is normal. There is mildly elevated pulmonary artery  systolic pressure. The estimated right ventricular systolic pressure is  38.2 mmHg.   3. Left atrial size was mild to moderately dilated.   4. The mitral valve is grossly normal. Moderate mitral valve  regurgitation. No evidence of mitral stenosis.   5. The aortic valve is tricuspid. Aortic valve regurgitation is not  visualized. No aortic stenosis is present.   6. The inferior vena cava is dilated in size with <50% respiratory  variability, suggesting right atrial pressure of 15 mmHg.   Comparison(s): Changes from prior study are noted. The left ventricular  function is significantly worse   Patient Profile     72 y.o. male with CAD s/p CABG x2 (SVG to PDA and SVG to OM1) in 03/2016 and subsequent DES to DES to native mid LAD and DES to SVG to PDA in 12/2018, severe aortic stenosis s/p AVR at time of CABG in 03/2016, paroxysmal atrial fibrillation s/p left atrial appendage clipping at time of CABG/ AVR on Eliquis, chronic HFrEF with EF of 30-35% in 02/2022 (declined from previous 50-55%), moderate MR, pleural effusions requiring prior thoracentesis, CVA in 2007 and again in 06/2018, pericardial effusion following CABG requiring  pericardial window, hypertension, hyperlipidemia, type 2 diabetes mellitus, hepatic cirrhosis, GERD, and dementia. Seen in ED 07/28/22 for worsening AMS superimposed on known dementia, attempting to throw himself on the ground, very anxious. Received dose of IV Lasix and discharged back to SNF. Returned to ED 08/01/22 with confusion and aggression, found to be hypoxic with developing LLL airspace disease. Cardiology asked to see for CHF.   Assessment & Plan    1. AMS/dementia with behavioral disturbance - CXR 5/22 question of atx versus PNA - per primary team  2. Acute on chronic HFrEF, essential HTN - I/O's not complete due to unmeasured occurrences of UOP, not yet weighed today - will order I/O's and daily weights - received IV Lasix 5/22, 5/23, will discuss rx dosing with Dr. Servando Salina - was on Lasix 40mg  BID PTA, suspect OK to resume today if lytes OK - additional GDMT limited by BP but continue Toprol 12.5mg  daily, spironolactone 12.5mg  daily - LVEF declined in 02/2022 but patient is not felt to be a candidate for invasive evaluation as this is contraindicated with his dementia and behavioral disturbance making the procedure risky since patient may be unable to comply with necessary safety directions  3. CAD s/p CABG, elevated troponin felt c/w demand ischemia - not on ASA with DOAC - prior issues with statins - transaminitis, rash, muscle pain per chart  - intended to be on [id number] prior to admission but listed as not taking, can review in OP setting  4. Severe AS s/p TAVR - no acute concern  5. Persistent atrial fibrillation - presented in atrial fibrillation. He was also in atrial fibrillation during admission in 02/2022 but refused telemetry part of the time - rate control strategy pursued given comorbidities, seems appropriate - continue apixaban, Toprol  6. Hypokalemia, hyponatremia, hypomagnesemia - no labs yet today, will order, will defer repletion plan to primary team  7. Chronic  appearing anemia, leukopenia - previous values 8-10 range - per primary team  Remainder of medical issues per IM Tentatively arranged in person f/u 6/4 - I see prior telephone notes inquiring about virtual visits. CHF is very difficult to assess virtually especially with multiple comorbidities therefore in-person visit recommended.  For questions or updates, please contact [facility] Please consult [email or web address] for contact info under Cardiology/STEMI. Signed, Laurann Montana, PA-C 08/04/2022, 8:50 AM     Patient seen and examined, note reviewed with the signed Advanced Practice Provider. I personally reviewed laboratory data, imaging studies and relevant notes. I independently examined the patient and formulated the important aspects of the plan. I have personally discussed the plan with the patient and/or family. Comments or changes to the note/plan are indicated below.  Patient seen and examined at his bedside.     Acute on Chronic HFrEF CAD s/p CABG Persistent Atrial fibrillation  Hypertension Type 2 Diabetes Mellitus  Dementia    Transition to p.o. Lasix 40 mg daily for now ideally, home doses twice daily.  Continue to replete electrolyte keep K above 4 mag above 2.   Know depressed ejection since 02/2022 with EF 30-35% and wall motion abnormalities. Plan is to invest in GDMT as blood pressure and clinical conditions can tolerate.    He currently does not have any angina symptoms,no plans for an ischemic evaluation at this time and overall with his advanced dementia and is not easily redirected and this is a relative contraindication for a successful ischemic evaluation.    Continue lipid lowering agent, and on DOAC so aspirin not on board.    In a-fib, continue rate limiting agent: Toprol XL 12.5 mg daily and Eliquis 5 mg twice daily.   He will need in person follow-up for a complete physical exam noted virtual visit will not help assist with clinically examining the  patient for any volume overload.   Thomasene Ripple DO, MS [facility] Attending Cardiologist [facility]  [address] [place], [place] [address] [phone] Website: [email or web address]

## 2022-08-04 NOTE — Progress Notes (Signed)
PROGRESS NOTE  Mark Cabrera VWU:[id number] DOB: 1950/12/21 DOA: 08/02/2022 PCP: Evlyn Kanner, MD   LOS: 1 day   Brief Narrative / Interim history: 72 year old male with chronic combined systolic and diastolic CHF, dementia comes into the hospital with complaints of behavioral disturbances from his nursing home, as well as suspected acute on chronic combined CHF.  Patient has been having several ED visits in the last several days due to reported increased behavioral disturbances, but in the ER as well as being calm.  Due to multiple visits he has missed his scheduled medications for his CHF. Concerningly, per prior documentation, EMS was reportedly told yesterday when he left his nursing home by the RN that "I do not care what you do with him, you can dump him in the left [employer] parking lot for all I care."   Subjective / 24h Interval events: Calm this morning, no acute overnight events  Assesement and Plan: Principal Problem:   Acute on chronic combined systolic and diastolic CHF (congestive heart failure) (HCC) Active Problems:   Dyslipidemia   Chronic diastolic heart failure (HCC)   CAD (coronary artery disease), native coronary artery   Paroxysmal atrial fibrillation (HCC)   Aggressive behavior   Dementia with behavioral disturbance (HCC)   Acute systolic (congestive) heart failure (HCC)   Acute on chronic systolic CHF (congestive heart failure) (HCC)   Principal problem Acute on chronic combined CHF -he has some congestion on the chest x-ray, received IV Lasix, repeat today.  BNP is elevated.  Most recent 2D echo was done in December 23 showing LVEF 30-35%, RV systolic function was also moderately reduced PA pressure was mildly elevated at 38.2 mmHg.  Cardiology consulted, appreciate input  Active problems Dementia, with probable behavioral disturbances -no behavioral issues since being here, continue to closely monitor.  Aricept has been discontinued by his neurologist,  continue BuSpar, Depakote, Lexapro, Seroquel -Discontinue safety sitter this morning, he really has not been to date.  Has been placed on Ativan for anxiety which seems to be helping.  Continue  PAF -continue Eliquis, metoprolol  Essential hypertension -continue metoprolol.  Blood pressure stable  Hypokalemia -replace potassium and continue to monitor.  Potassium normalized today  Anemia -no bleeding, monitor  Hyponatremia -mild, in the setting of CHF.  Monitor, overall stable, 134 this morning  Scheduled Meds:  apixaban  5 mg Oral BID   busPIRone  7.5 mg Oral BID   divalproex  250 mg Oral BID   docusate sodium  100 mg Oral BID   escitalopram  10 mg Oral Daily   feeding supplement  237 mL Oral BID BM   insulin aspart  0-15 Units Subcutaneous TID WC   insulin aspart  0-5 Units Subcutaneous QHS   metoprolol succinate  12.5 mg Oral Daily   multivitamin with minerals  1 tablet Oral Daily   QUEtiapine  75 mg Oral QHS   spironolactone  12.5 mg Oral Daily   valACYclovir  1,000 mg Oral BID   Continuous Infusions:   PRN Meds:.acetaminophen **OR** acetaminophen, albuterol, ipratropium-albuterol, LORazepam, ondansetron **OR** ondansetron (ZOFRAN) IV, polyethylene glycol, traZODone  Current Outpatient Medications  Medication Instructions   Accu-Chek Softclix Lancets lancets Use as instructed   acetaminophen (TYLENOL) 650 mg, Oral, Every 4 hours PRN   Alirocumab (PRALUENT) 75 MG/ML SOAJ 1 Pen, Subcutaneous, Every 14 days   apixaban (ELIQUIS) 5 MG TABS tablet Take 1 tablet by mouth twice daily   busPIRone (BUSPAR) 7.5 mg, Oral, 2 times daily  clotrimazole (LOTRIMIN) 1 % cream Topical   cyanocobalamin (VITAMIN B12) 100 mcg, Oral, Daily   divalproex (DEPAKOTE) 125 mg, Oral, 2 times daily   donepezil (ARICEPT) 5 mg, Oral, Daily at bedtime   escitalopram (LEXAPRO) 10 mg, Oral, Daily   fluconazole (DIFLUCAN) 150 mg, Oral, Every 3 DAYS   furosemide (LASIX) 40 mg, Oral, 2 times daily    glucose blood (ACCU-CHEK GUIDE) test strip Use as instructed   insulin aspart (NOVOLOG FLEXPEN) 100 UNIT/ML FlexPen INJECT 5 UNITS UNDER THE SKIN THREE TIMES DAILY WITH MEALS   insulin glargine-yfgn (SEMGLEE) 26 Units, Subcutaneous, Daily at bedtime   Insulin Pen Needle 31G X 5 MM MISC Use one pen needle three times daily. Dx E11.49   ipratropium-albuterol (DUONEB) 0.5-2.5 (3) MG/3ML SOLN 3 mLs, Inhalation, Every 6 hours PRN   levofloxacin (LEVAQUIN) 500 mg, Oral, Daily   metFORMIN (GLUCOPHAGE) 500 mg, Oral, 2 times daily with meals   metoprolol succinate (TOPROL-XL) 12.5 mg, Oral, Daily   polyethylene glycol (MIRALAX / GLYCOLAX) 17 g, Oral, Daily   potassium chloride SA (KLOR-CON M) 20 MEQ tablet One po bid x 3 days, then one po once a day   QUEtiapine (SEROQUEL) 50 mg, Oral, Daily at bedtime   Repatha 140 mg, Subcutaneous, Every 14 days   spironolactone (ALDACTONE) 12.5 mg, Oral, Daily   valACYclovir (VALTREX) 1,000 mg, Oral, 2 times daily    Diet Orders (From admission, onward)     Start     Ordered   08/02/22 1437  Diet heart healthy/carb modified Room service appropriate? Yes; Fluid consistency: Thin  Diet effective now       Question Answer Comment  Diet-HS Snack? Nothing   Room service appropriate? Yes   Fluid consistency: Thin      08/02/22 1437            DVT prophylaxis: SCDs Start: 08/02/22 1436 apixaban (ELIQUIS) tablet 5 mg   Lab Results  Component Value Date   PLT 206 08/04/2022      Code Status: DNR  Family Communication: Daughter over the phone  Status is: Inpatient  Level of care: Telemetry  Consultants:  Cardiology  Objective: Vitals:   08/03/22 1315 08/03/22 1948 08/04/22 0630 08/04/22 0900  BP: 128/71 107/77 110/87   Pulse: (!) 51 61 87   Resp: 16 16 16    Temp: 97.6 F (36.4 C) 97.6 F (36.4 C) 98.5 F (36.9 C)   TempSrc: Oral  Oral   SpO2: 100% 99% 98%   Weight:    80 kg  Height:        Intake/Output Summary (Last 24 hours) at  08/04/2022 1048 Last data filed at 08/04/2022 0959 Gross per 24 hour  Intake 840 ml  Output 1825 ml  Net -985 ml    Wt Readings from Last 3 Encounters:  08/04/22 80 kg  08/01/22 85.2 kg  07/28/22 85.2 kg    Examination:  Constitutional: NAD Eyes: lids and conjunctivae normal, no scleral icterus ENMT: mmm Neck: normal, supple Respiratory: clear to auscultation bilaterally, no wheezing, no crackles. Normal respiratory effort.  Cardiovascular: Regular rate and rhythm, no murmurs / rubs / gallops. No LE edema. Abdomen: soft, no distention, no tenderness. Bowel sounds positive.   Data Reviewed: I have independently reviewed following labs and imaging studies   CBC Recent Labs  Lab 07/28/22 2210 08/01/22 1930 08/03/22 0346 08/04/22 0953  WBC 3.4* 2.9* 2.5* 2.7*  HGB 10.0* 9.9* 8.9* 9.8*  HCT 30.2* 30.7* 27.4* 31.1*  PLT 338 225 205 206  MCV 97.4 100.0 98.6 104.0*  MCH 32.3 32.2 32.0 32.8  MCHC 33.1 32.2 32.5 31.5  RDW 16.4* 17.0* 16.9* 17.2*  LYMPHSABS 0.8  --   --   --   MONOABS 0.1  --   --   --   EOSABS 0.0  --   --   --   BASOSABS 0.0  --   --   --      Recent Labs  Lab 07/28/22 2210 08/01/22 1930 08/02/22 1338 08/02/22 1611 08/03/22 0346 08/04/22 0953  NA 132* 135 132*  --  134* 134*  K 3.0* 3.1* 4.1  --  3.3* 4.2  CL 100 104 99  --  103 104  CO2 18* 18* 19*  --  21* 18*  GLUCOSE 260* 188* 287*  --  164* 198*  BUN 23 28* 31*  --  31* 33*  CREATININE 1.24 1.09 1.28*  --  1.11 1.12  CALCIUM 8.5* 8.3* 8.4*  --  8.2* 8.3*  AST 22 22  --   --   --   --   ALT 14 14  --   --   --   --   ALKPHOS 75 77  --   --   --   --   BILITOT 0.8 0.9  --   --   --   --   ALBUMIN 3.4* 3.3*  --   --   --   --   MG  --   --  1.5*  --   --  1.8  HGBA1C  --   --   --  8.5*  --   --   BNP 2,006.3* 1,518.8* 1,451.9*  --   --   --      ------------------------------------------------------------------------------------------------------------------ No results for input(s):  "CHOL", "HDL", "LDLCALC", "TRIG", "CHOLHDL", "LDLDIRECT" in the last 72 hours.  Lab Results  Component Value Date   HGBA1C 8.5 (H) 08/02/2022   ------------------------------------------------------------------------------------------------------------------ No results for input(s): "TSH", "T4TOTAL", "T3FREE", "THYROIDAB" in the last 72 hours.  Invalid input(s): "FREET3"  Cardiac Enzymes No results for input(s): "CKMB", "TROPONINI", "MYOGLOBIN" in the last 168 hours.  Invalid input(s): "CK" ------------------------------------------------------------------------------------------------------------------    Component Value Date/Time   BNP 1,451.9 (H) 08/02/2022 1338    CBG: Recent Labs  Lab 08/03/22 0726 08/03/22 1119 08/03/22 1712 08/03/22 1938 08/04/22 0802  GLUCAP 164* 225* 267* 183* 183*     Recent Results (from the past 240 hour(s))  MRSA Next Gen by PCR, Nasal     Status: Abnormal   Collection Time: 08/02/22  4:19 PM   Specimen: Nasal Mucosa; Nasal Swab  Result Value Ref Range Status   MRSA by PCR Next Gen DETECTED (A) NOT DETECTED Final    Comment: (NOTE) The GeneXpert MRSA Assay (FDA approved for NASAL specimens only), is one component of a comprehensive MRSA colonization surveillance program. It is not intended to diagnose MRSA infection nor to guide or monitor treatment for MRSA infections. Test performance is not FDA approved in patients less than 85 years old. Performed at [facility], [address], [place], [place] [address]      Radiology Studies: No results found.   Pamella Pert, MD, PhD [facility]  Between 7 am - 7 pm I am available, please contact me via Amion (for emergencies) or Securechat (non urgent messages)  Between 7 pm - 7 am I am not available, please contact night coverage MD/APP via Amion

## 2022-08-04 NOTE — Progress Notes (Signed)
Heart Failure Navigator Progress Note  Assessed for Heart & Vascular TOC clinic readiness.  Patient does not meet criteria due to history of dementia. .   Navigator will sign off at this time.   Ralston Venus, BSN, RN Heart Failure Nurse Navigator Secure Chat Only   

## 2022-08-05 DIAGNOSIS — I5043 Acute on chronic combined systolic (congestive) and diastolic (congestive) heart failure: Secondary | ICD-10-CM | POA: Diagnosis not present

## 2022-08-05 LAB — CBC
HCT: 26.2 % — ABNORMAL LOW (ref 39.0–52.0)
Hemoglobin: 8.6 g/dL — ABNORMAL LOW (ref 13.0–17.0)
MCH: 32.8 pg (ref 26.0–34.0)
MCHC: 32.8 g/dL (ref 30.0–36.0)
MCV: 100 fL (ref 80.0–100.0)
Platelets: 194 10*3/uL (ref 150–400)
RBC: 2.62 MIL/uL — ABNORMAL LOW (ref 4.22–5.81)
RDW: 17.1 % — ABNORMAL HIGH (ref 11.5–15.5)
WBC: 2.2 10*3/uL — ABNORMAL LOW (ref 4.0–10.5)
nRBC: 0 % (ref 0.0–0.2)

## 2022-08-05 LAB — BASIC METABOLIC PANEL
Anion gap: 8 (ref 5–15)
BUN: 31 mg/dL — ABNORMAL HIGH (ref 8–23)
CO2: 25 mmol/L (ref 22–32)
Calcium: 8.5 mg/dL — ABNORMAL LOW (ref 8.9–10.3)
Chloride: 102 mmol/L (ref 98–111)
Creatinine, Ser: 1.18 mg/dL (ref 0.61–1.24)
GFR, Estimated: >60 mL/min (ref >60)
Glucose, Bld: 269 mg/dL — ABNORMAL HIGH (ref 70–99)
Potassium: 3.7 mmol/L (ref 3.5–5.1)
Sodium: 135 mmol/L (ref 135–145)

## 2022-08-05 LAB — GLUCOSE, CAPILLARY
Glucose-Capillary: 198 mg/dL — ABNORMAL HIGH (ref 70–99)
Glucose-Capillary: 434 mg/dL — ABNORMAL HIGH (ref 70–99)
Glucose-Capillary: 77 mg/dL (ref 70–99)
Glucose-Capillary: 251 mg/dL — ABNORMAL HIGH (ref 70–99)

## 2022-08-05 MED ORDER — INSULIN ASPART 100 UNIT/ML IJ SOLN
3.0000 [IU] | Freq: Three times a day (TID) | INTRAMUSCULAR | Status: DC
  Administered 2022-08-05 – 2022-08-14 (×27): 3 [IU] via SUBCUTANEOUS

## 2022-08-05 NOTE — Progress Notes (Signed)
Mark Cabrera, Mark DOA: 08/02/2022 PCP: Evlyn Kanner, Mark   LOS: 2 days   Brief Narrative / Interim history: 72 year old male with chronic combined systolic and diastolic Cabrera, Mark Cabrera, Mark Cabrera having several ED visits in the last several days due to reported increased behavioral disturbances, but in the ER Mark well Mark being calm.  Due to multiple visits he has missed his scheduled medications for his Cabrera. Concerningly, per prior documentation, EMS was reportedly told yesterday when he left his nursing Cabrera by the RN that "I do not care what you do with him, you can dump him in the left [employer] parking lot for all I care."   Subjective / 24h Interval events: No overnight events, no complaints this morning.  Calm, acknowledges me but does not interact much  Assesement and Plan: Principal Problem:   Acute on chronic combined systolic and diastolic Cabrera (congestive heart failure) (HCC) Active Problems:   Dyslipidemia   Chronic diastolic heart failure (HCC)   CAD (coronary artery disease), native coronary artery   Paroxysmal atrial fibrillation (HCC)   Aggressive behavior   Mark with behavioral disturbance (HCC)   Acute systolic (congestive) heart failure (HCC)   Acute on chronic systolic Cabrera (congestive heart failure) (HCC)   Principal problem Acute on chronic combined Cabrera -he has some congestion on the chest x-ray, received IV Lasix, repeat today.  BNP is elevated.  Most recent 2D echo was done in December 23 showing LVEF 30-35%, RV systolic function was also moderately reduced PA pressure was mildly elevated at 38.2 mmHg.  Cardiology consulted, appreciate input.  He was diuresed with IV Lasix, now euvolemic, converted to p.o.  Active problems Mark, with probable behavioral disturbances  -no behavioral issues since being here, continue to closely monitor.  Aricept has Cabrera discontinued by his neurologist, continue BuSpar, Depakote, Lexapro, Seroquel -Continue Ativan for anxiety.  Seems to be working.  Awaiting placement to a memory care unit  PAF -continue Eliquis, metoprolol  Essential hypertension -continue metoprolol.  Blood pressure stable  Hypokalemia -replace potassium and continue to monitor.  Potassium stable.  Continue to monitor  Anemia -no bleeding, monitor  Hyponatremia -mild, in the setting of Cabrera.  Monitor, overall stable, 134 this morning  Scheduled Meds:  apixaban  5 mg Oral BID   busPIRone  7.5 mg Oral BID   divalproex  250 mg Oral BID   docusate sodium  100 mg Oral BID   escitalopram  10 mg Oral Daily   feeding supplement  237 mL Oral BID BM   furosemide  40 mg Oral Daily   insulin aspart  0-15 Units Subcutaneous TID WC   insulin aspart  0-5 Units Subcutaneous QHS   metoprolol succinate  12.5 mg Oral Daily   multivitamin with minerals  1 tablet Oral Daily   QUEtiapine  75 mg Oral QHS   spironolactone  12.5 mg Oral Daily   valACYclovir  1,000 mg Oral BID   Continuous Infusions:   PRN Meds:.acetaminophen **OR** acetaminophen, albuterol, ipratropium-albuterol, LORazepam, ondansetron **OR** ondansetron (ZOFRAN) IV, polyethylene glycol, traZODone  Current Outpatient Medications  Medication Instructions   Accu-Chek Softclix Lancets lancets Use Mark instructed   acetaminophen (TYLENOL) 650 mg, Oral, Every 4 hours PRN   Alirocumab (PRALUENT) 75 MG/ML SOAJ 1 Pen, Subcutaneous, Every 14 days   apixaban (  ELIQUIS) 5 MG TABS tablet Take 1 tablet by mouth twice daily   busPIRone (BUSPAR) 7.5 mg, Oral, 2 times daily   clotrimazole (LOTRIMIN) 1 % cream Topical   cyanocobalamin (VITAMIN B12) 100 mcg, Oral, Daily   divalproex (DEPAKOTE) 125 mg, Oral, 2 times daily   donepezil (ARICEPT) 5 mg, Oral, Daily at bedtime   escitalopram (LEXAPRO) 10 mg, Oral, Daily    fluconazole (DIFLUCAN) 150 mg, Oral, Every 3 DAYS   furosemide (LASIX) 40 mg, Oral, 2 times daily   glucose blood (ACCU-CHEK GUIDE) test strip Use Mark instructed   insulin aspart (NOVOLOG FLEXPEN) 100 UNIT/ML FlexPen INJECT 5 UNITS UNDER THE SKIN THREE TIMES DAILY WITH MEALS   insulin glargine-yfgn (SEMGLEE) 26 Units, Subcutaneous, Daily at bedtime   Insulin Pen Needle 31G X 5 MM MISC Use one pen needle three times daily. Dx E11.49   ipratropium-albuterol (DUONEB) 0.5-2.5 (3) MG/3ML SOLN 3 mLs, Inhalation, Every 6 hours PRN   levofloxacin (LEVAQUIN) 500 mg, Oral, Daily   metFORMIN (GLUCOPHAGE) 500 mg, Oral, 2 times daily with meals   metoprolol succinate (TOPROL-XL) 12.5 mg, Oral, Daily   polyethylene glycol (MIRALAX / GLYCOLAX) 17 g, Oral, Daily   potassium chloride SA (KLOR-CON M) 20 MEQ tablet One po bid x 3 days, then one po once a day   QUEtiapine (SEROQUEL) 50 mg, Oral, Daily at bedtime   Repatha 140 mg, Subcutaneous, Every 14 days   spironolactone (ALDACTONE) 12.5 mg, Oral, Daily   valACYclovir (VALTREX) 1,000 mg, Oral, 2 times daily    Diet Orders (From admission, onward)     Start     Ordered   05/22/Cabrera 1437  Diet heart healthy/carb modified Room service appropriate? Yes; Fluid consistency: Thin  Diet effective now       Question Answer Comment  Diet-HS Snack? Nothing   Room service appropriate? Yes   Fluid consistency: Thin      05/22/Cabrera 1437            DVT prophylaxis: SCDs Start: 05/22/Cabrera 1436 apixaban (ELIQUIS) tablet 5 mg   Lab Results  Component Value Date   PLT 194 08/05/2022      Code Status: DNR  Family Communication: Daughter over the phone  Status is: Inpatient  Level of care: Telemetry  Consultants:  Cardiology  Objective: Vitals:   05/Cabrera/Cabrera 1307 05/Cabrera/Cabrera 2102 05/25/Cabrera 0407 05/25/Cabrera 0409  BP: 119/78 117/83 106/73   Pulse: 71 90 73   Resp: 18 16 15    Temp:  (!) 97.4 F (36.3 C) 98.4 F (36.9 C)   TempSrc:  Oral Oral   SpO2: 100%  100% 100%   Weight:    76.9 kg  Height:        Intake/Output Summary (Last Cabrera hours) at 08/05/2022 1101 Last data filed at 08/05/2022 0818 Gross per Cabrera hour  Intake 480 ml  Output 1750 ml  Net -1270 ml    Wt Readings from Last 3 Encounters:  05/25/Cabrera 76.9 kg  05/21/Cabrera 85.2 kg  05/17/Cabrera 85.2 kg    Examination:  Constitutional: NAD Eyes: lids and conjunctivae normal, no scleral icterus ENMT: mmm Neck: normal, supple Respiratory: clear to auscultation bilaterally, no wheezing, no crackles. Normal respiratory effort.  Cardiovascular: Regular rate and rhythm, no murmurs / rubs / gallops. No LE edema. Abdomen: soft, no distention, no tenderness. Bowel sounds positive.   Data Reviewed: I have independently reviewed following labs and imaging studies   CBC Recent Labs  Lab 05/21/Cabrera 1930 05/23/Cabrera 0346 05/Cabrera/Cabrera  1610 05/25/Cabrera 0347  WBC 2.9* 2.5* 2.7* 2.2*  HGB 9.9* 8.9* 9.8* 8.6*  HCT 30.7* 27.4* 31.1* 26.2*  PLT 225 205 206 194  MCV 100.0 98.6 104.0* 100.0  MCH 32.2 32.0 32.8 32.8  MCHC 32.2 32.5 31.5 32.8  RDW 17.0* 16.9* 17.2* 17.1*     Recent Labs  Lab 05/21/Cabrera 1930 05/22/Cabrera 1338 05/22/Cabrera 1611 05/23/Cabrera 0346 05/Cabrera/Cabrera 0953 05/25/Cabrera 0347  NA 135 132*  --  134* 134* 135  K 3.1* 4.1  --  3.3* 4.2 3.7  CL 104 99  --  103 104 102  CO2 18* 19*  --  21* 18* 25  GLUCOSE 188* 287*  --  164* 198* 269*  BUN 28* 31*  --  31* 33* 31*  CREATININE 1.09 1.28*  --  1.11 1.12 1.18  CALCIUM 8.3* 8.4*  --  8.2* 8.3* 8.5*  AST 22  --   --   --   --   --   ALT 14  --   --   --   --   --   ALKPHOS 77  --   --   --   --   --   BILITOT 0.9  --   --   --   --   --   ALBUMIN 3.3*  --   --   --   --   --   MG  --  1.5*  --   --  1.8  --   HGBA1C  --   --  8.5*  --   --   --   BNP 1,518.8* 1,451.9*  --   --  1,284.8*  --      ------------------------------------------------------------------------------------------------------------------ No results for input(s): "CHOL", "HDL",  "LDLCALC", "TRIG", "CHOLHDL", "LDLDIRECT" in the last 72 hours.  Lab Results  Component Value Date   HGBA1C 8.5 (H) 08/02/2022   ------------------------------------------------------------------------------------------------------------------ No results for input(s): "TSH", "T4TOTAL", "T3FREE", "THYROIDAB" in the last 72 hours.  Invalid input(s): "FREET3"  Cardiac Enzymes No results for input(s): "CKMB", "TROPONINI", "MYOGLOBIN" in the last 168 hours.  Invalid input(s): "CK" ------------------------------------------------------------------------------------------------------------------    Component Value Date/Time   BNP 1,284.8 (H) 05/Cabrera/2024 0953    CBG: Recent Labs  Lab 05/Cabrera/Cabrera 1137 05/Cabrera/Cabrera 1539 05/Cabrera/Cabrera 1811 05/Cabrera/Cabrera 2155 05/25/Cabrera 0755  GLUCAP 240* 204* 226* 123* 198*     Recent Results (from the past 240 hour(s))  MRSA Next Gen by PCR, Nasal     Status: Abnormal   Collection Time: 05/22/Cabrera  4:19 PM   Specimen: Nasal Mucosa; Nasal Swab  Result Value Ref Range Status   MRSA by PCR Next Gen DETECTED (A) NOT DETECTED Final    Comment: (NOTE) The GeneXpert MRSA Assay (FDA approved for NASAL specimens only), is one component of a comprehensive MRSA colonization surveillance program. It is not intended to diagnose MRSA infection nor to guide or monitor treatment for MRSA infections. Test performance is not FDA approved in patients less than 76 years old. Performed at [facility], [address], [place], [place] [address]      Radiology Studies: No results found.   Pamella Pert, MD, PhD [facility]  Between 7 am - 7 pm I am available, please contact me via Amion (for emergencies) or Securechat (non urgent messages)  Between 7 pm - 7 am I am not available, please contact night coverage Mark/APP via Amion

## 2022-08-05 NOTE — Progress Notes (Signed)
Rounding Note    Patient Name: Mark Cabrera Date of Encounter: 08/05/2022  [facility] Cardiologist: Verne Carrow, MD   Subjective   Somnolent; denies dyspnea or chest pain  Inpatient Medications    Scheduled Meds:  apixaban  5 mg Oral BID   busPIRone  7.5 mg Oral BID   divalproex  250 mg Oral BID   docusate sodium  100 mg Oral BID   escitalopram  10 mg Oral Daily   feeding supplement  237 mL Oral BID BM   furosemide  40 mg Oral Daily   insulin aspart  0-15 Units Subcutaneous TID WC   insulin aspart  0-5 Units Subcutaneous QHS   metoprolol succinate  12.5 mg Oral Daily   multivitamin with minerals  1 tablet Oral Daily   QUEtiapine  75 mg Oral QHS   spironolactone  12.5 mg Oral Daily   valACYclovir  1,000 mg Oral BID   Continuous Infusions:  PRN Meds: acetaminophen **OR** acetaminophen, albuterol, ipratropium-albuterol, LORazepam, ondansetron **OR** ondansetron (ZOFRAN) IV, polyethylene glycol, traZODone   Vital Signs    Vitals:   08/04/22 1307 08/04/22 2102 08/05/22 0407 08/05/22 0409  BP: 119/78 117/83 106/73   Pulse: 71 90 73   Resp: 18 16 15    Temp:  (!) 97.4 F (36.3 C) 98.4 F (36.9 C)   TempSrc:  Oral Oral   SpO2: 100% 100% 100%   Weight:    76.9 kg  Height:        Intake/Output Summary (Last 24 hours) at 08/05/2022 0732 Last data filed at 08/05/2022 0400 Gross per 24 hour  Intake 800 ml  Output 1600 ml  Net -800 ml      08/05/2022    4:09 AM 08/04/2022    9:00 AM 08/02/2022    4:04 PM  Last 3 Weights  Weight (lbs) 169 lb 8 oz 176 lb 5.9 oz 173 lb 4.5 oz  Weight (kg) 76.885 kg 80 kg 78.6 kg      Telemetry    Atrial fibrillation rate controlled- Personally Reviewed   Physical Exam   GEN: No acute distress.   Neck: No JVD Cardiac: irregular Respiratory: Clear to auscultation bilaterally. GI: Soft, nontender, non-distended  MS: No edema Neuro: Moves all extremities Psych: Somnolent; difficult to understand.  Labs     High Sensitivity Troponin:   Recent Labs  Lab 07/28/22 2210 08/02/22 1338  TROPONINIHS 37* 29*     Chemistry Recent Labs  Lab 08/01/22 1930 08/02/22 1338 08/03/22 0346 08/04/22 0953  NA 135 132* 134* 134*  K 3.1* 4.1 3.3* 4.2  CL 104 99 103 104  CO2 18* 19* 21* 18*  GLUCOSE 188* 287* 164* 198*  BUN 28* 31* 31* 33*  CREATININE 1.09 1.28* 1.11 1.12  CALCIUM 8.3* 8.4* 8.2* 8.3*  MG  --  1.5*  --  1.8  PROT 7.3  --   --   --   ALBUMIN 3.3*  --   --   --   AST 22  --   --   --   ALT 14  --   --   --   ALKPHOS 77  --   --   --   BILITOT 0.9  --   --   --   GFRNONAA >60 59* >60 >60  ANIONGAP 13 14 10 12    Hematology Recent Labs  Lab 08/03/22 0346 08/04/22 0953 08/05/22 0347  WBC 2.5* 2.7* 2.2*  RBC 2.78* 2.99* 2.62*  HGB 8.9* 9.8* 8.6*  HCT 27.4* 31.1* 26.2*  MCV 98.6 104.0* 100.0  MCH 32.0 32.8 32.8  MCHC 32.5 31.5 32.8  RDW 16.9* 17.2* 17.1*  PLT 205 206 194    BNP Recent Labs  Lab 08/01/22 1930 08/02/22 1338 08/04/22 0953  BNP 1,518.8* 1,451.9* 1,284.8*     Patient Profile     72 y.o. male with CAD s/p CABG x2 (SVG to PDA and SVG to OM1) in 03/2016 and subsequent DES to DES to native mid LAD and DES to SVG to PDA in 12/2018, severe aortic stenosis s/p AVR at time of CABG in 03/2016, paroxysmal atrial fibrillation s/p left atrial appendage clipping at time of CABG/ AVR on Eliquis, chronic HFrEF with EF of 30-35% in 02/2022 (declined from previous 50-55%), moderate MR, pleural effusions requiring prior thoracentesis, CVA in 2007 and again in 06/2018, pericardial effusion following CABG requiring pericardial window, hypertension, hyperlipidemia, type 2 diabetes mellitus, hepatic cirrhosis, GERD, and dementia. Seen in ED 07/28/22 for worsening AMS superimposed on known dementia, attempting to throw himself on the ground, very anxious. Received dose of IV Lasix and discharged back to SNF. Returned to ED 08/01/22 with confusion and aggression, found to be hypoxic  with developing LLL airspace disease. Cardiology asked to see for CHF.   Echocardiogram December 2023 showed ejection fraction 30 to 35%, moderate RV dysfunction, mild to moderate left atrial enlargement, moderate mitral regurgitation.  Assessment & Plan    1 acute on chronic combined systolic/diastolic congestive heart failure-patient appears to be euvolemic on examination.  Would continue Lasix and spironolactone at present dose.  At discharge would increase Lasix back to 40 mg by mouth twice daily.  He will need close follow up of renal function and potassium after discharge.  2 ischemic cardiomyopathy-LV function decreased compared to previous.  Patient felt not to be a candidate for aggressive ischemia evaluation given underlying dementia.  Continue beta-blocker and spironolactone.  Blood pressure is borderline.  Would consider addition of low-dose ARB as an outpatient.  3 atrial fibrillation-heart rate is controlled.  Continue Toprol.  Continue apixaban.  4 coronary artery disease status post coronary bypass and graft-patient apparently is statin intolerant.  Can follow-up as an outpatient and if mental status improves consider PCSK9 inhibitor.  Other issues per primary service.  Cardiology will sign off.  Please call with questions.  Will arrange follow-up with APP in 2 weeks.  For questions or updates, please contact [facility] Please consult [email or web address] for contact info under        Signed, Olga Millers, MD  08/05/2022, 7:32 AM

## 2022-08-05 NOTE — Progress Notes (Signed)
Telemetry noted a 7 beat run of v-tach at 1535 and a 5 beat run of v-tach at 1700. Pt asymptomatic throughout. MD made aware. Pt is in no acute distress.

## 2022-08-06 DIAGNOSIS — I5043 Acute on chronic combined systolic (congestive) and diastolic (congestive) heart failure: Secondary | ICD-10-CM | POA: Diagnosis not present

## 2022-08-06 LAB — CBC
HCT: 25.7 % — ABNORMAL LOW (ref 39.0–52.0)
Hemoglobin: 8.4 g/dL — ABNORMAL LOW (ref 13.0–17.0)
MCH: 32.3 pg (ref 26.0–34.0)
MCHC: 32.7 g/dL (ref 30.0–36.0)
MCV: 98.8 fL (ref 80.0–100.0)
Platelets: 213 10*3/uL (ref 150–400)
RBC: 2.6 MIL/uL — ABNORMAL LOW (ref 4.22–5.81)
RDW: 16.8 % — ABNORMAL HIGH (ref 11.5–15.5)
WBC: 2.1 10*3/uL — ABNORMAL LOW (ref 4.0–10.5)
nRBC: 0 % (ref 0.0–0.2)

## 2022-08-06 LAB — BASIC METABOLIC PANEL
Anion gap: 9 (ref 5–15)
BUN: 31 mg/dL — ABNORMAL HIGH (ref 8–23)
CO2: 25 mmol/L (ref 22–32)
Calcium: 8.6 mg/dL — ABNORMAL LOW (ref 8.9–10.3)
Chloride: 102 mmol/L (ref 98–111)
Creatinine, Ser: 0.97 mg/dL (ref 0.61–1.24)
GFR, Estimated: >60 mL/min (ref >60)
Glucose, Bld: 223 mg/dL — ABNORMAL HIGH (ref 70–99)
Potassium: 3.4 mmol/L — ABNORMAL LOW (ref 3.5–5.1)
Sodium: 136 mmol/L (ref 135–145)

## 2022-08-06 LAB — MAGNESIUM: Magnesium: 1.6 mg/dL — ABNORMAL LOW (ref 1.7–2.4)

## 2022-08-06 LAB — GLUCOSE, CAPILLARY
Glucose-Capillary: 173 mg/dL — ABNORMAL HIGH (ref 70–99)
Glucose-Capillary: 260 mg/dL — ABNORMAL HIGH (ref 70–99)
Glucose-Capillary: 232 mg/dL — ABNORMAL HIGH (ref 70–99)
Glucose-Capillary: 196 mg/dL — ABNORMAL HIGH (ref 70–99)

## 2022-08-06 NOTE — Plan of Care (Signed)
  Problem: Education: Goal: Knowledge of General Education information will improve Description: Including pain rating scale, medication(s)/side effects and non-pharmacologic comfort measures 08/06/2022 0238 by Charmian Muff, RN Outcome: Progressing 08/06/2022 0237 by Charmian Muff, RN Outcome: Progressing   Problem: Health Behavior/Discharge Planning: Goal: Ability to manage health-related needs will improve 08/06/2022 0238 by Charmian Muff, RN Outcome: Progressing 08/06/2022 0237 by Charmian Muff, RN Outcome: Progressing

## 2022-08-06 NOTE — Plan of Care (Signed)

## 2022-08-06 NOTE — Plan of Care (Signed)
  Problem: Education: Goal: Knowledge of General Education information will improve Description Including pain rating scale, medication(s)/side effects and non-pharmacologic comfort measures Outcome: Progressing   Problem: Health Behavior/Discharge Planning: Goal: Ability to manage health-related needs will improve Outcome: Progressing   

## 2022-08-06 NOTE — Progress Notes (Signed)
PROGRESS NOTE  Mark Cabrera:[id number] DOB: 09/20/1950 DOA: 08/02/2022 PCP: Evlyn Kanner, MD   LOS: 3 days   Brief Narrative / Interim history: 72 year old male with chronic combined systolic and diastolic CHF, dementia comes into the hospital with complaints of behavioral disturbances from his nursing home, as well as suspected acute on chronic combined CHF.  Patient has been having several ED visits in the last several days due to reported increased behavioral disturbances, but in the ER as well as being calm.  Due to multiple visits he has missed his scheduled medications for his CHF. Concerningly, per prior documentation, EMS was reportedly told yesterday when he left his nursing home by the RN that "I do not care what you do with him, you can dump him in the left [employer] parking lot for all I care."   Subjective / 24h Interval events: No overnight events, no complaints.  Assesement and Plan: Principal Problem:   Acute on chronic combined systolic and diastolic CHF (congestive heart failure) (HCC) Active Problems:   Dyslipidemia   Chronic diastolic heart failure (HCC)   CAD (coronary artery disease), native coronary artery   Paroxysmal atrial fibrillation (HCC)   Aggressive behavior   Dementia with behavioral disturbance (HCC)   Acute systolic (congestive) heart failure (HCC)   Acute on chronic systolic CHF (congestive heart failure) (HCC)   Principal problem Acute on chronic combined CHF -he has some congestion on the chest x-ray, received IV Lasix, repeat today.  BNP is elevated.  Most recent 2D echo was done in December 23 showing LVEF 30-35%, RV systolic function was also moderately reduced PA pressure was mildly elevated at 38.2 mmHg.  Cardiology consulted, appreciate input.  He was diuresed with IV Lasix, now euvolemic, converted to p.o. stable, pending SNF.  Per cardiology, at discharge will increase Lasix to 40 mg twice daily  Active problems Dementia, with  probable behavioral disturbances -no behavioral issues since being here, continue to closely monitor.  Aricept has been discontinued by his neurologist, continue BuSpar, Depakote, Lexapro, Seroquel -Continue Ativan for anxiety.  Seems to be working.  Awaiting placement to a memory care unit  PAF -continue Eliquis, metoprolol  Essential hypertension -continue metoprolol.  Blood pressure stable  Hypokalemia -replace potassium and continue to monitor.  Potassium stable.  Continue to monitor  Anemia -no bleeding, monitor  Hyponatremia -mild, in the setting of CHF.  Sodium normalized after diuresis  Scheduled Meds:  apixaban  5 mg Oral BID   busPIRone  7.5 mg Oral BID   divalproex  250 mg Oral BID   docusate sodium  100 mg Oral BID   escitalopram  10 mg Oral Daily   feeding supplement  237 mL Oral BID BM   furosemide  40 mg Oral Daily   insulin aspart  0-15 Units Subcutaneous TID WC   insulin aspart  0-5 Units Subcutaneous QHS   insulin aspart  3 Units Subcutaneous TID WC   metoprolol succinate  12.5 mg Oral Daily   multivitamin with minerals  1 tablet Oral Daily   QUEtiapine  75 mg Oral QHS   spironolactone  12.5 mg Oral Daily   valACYclovir  1,000 mg Oral BID   Continuous Infusions:   PRN Meds:.acetaminophen **OR** acetaminophen, albuterol, ipratropium-albuterol, LORazepam, ondansetron **OR** ondansetron (ZOFRAN) IV, polyethylene glycol, traZODone  Current Outpatient Medications  Medication Instructions   Accu-Chek Softclix Lancets lancets Use as instructed   acetaminophen (TYLENOL) 650 mg, Oral, Every 4 hours PRN   Alirocumab (  PRALUENT) 75 MG/ML SOAJ 1 Pen, Subcutaneous, Every 14 days   apixaban (ELIQUIS) 5 MG TABS tablet Take 1 tablet by mouth twice daily   busPIRone (BUSPAR) 7.5 mg, Oral, 2 times daily   clotrimazole (LOTRIMIN) 1 % cream Topical   cyanocobalamin (VITAMIN B12) 100 mcg, Oral, Daily   divalproex (DEPAKOTE) 125 mg, Oral, 2 times daily   donepezil (ARICEPT) 5  mg, Oral, Daily at bedtime   escitalopram (LEXAPRO) 10 mg, Oral, Daily   fluconazole (DIFLUCAN) 150 mg, Oral, Every 3 DAYS   furosemide (LASIX) 40 mg, Oral, 2 times daily   glucose blood (ACCU-CHEK GUIDE) test strip Use as instructed   insulin aspart (NOVOLOG FLEXPEN) 100 UNIT/ML FlexPen INJECT 5 UNITS UNDER THE SKIN THREE TIMES DAILY WITH MEALS   insulin glargine-yfgn (SEMGLEE) 26 Units, Subcutaneous, Daily at bedtime   Insulin Pen Needle 31G X 5 MM MISC Use one pen needle three times daily. Dx E11.49   ipratropium-albuterol (DUONEB) 0.5-2.5 (3) MG/3ML SOLN 3 mLs, Inhalation, Every 6 hours PRN   levofloxacin (LEVAQUIN) 500 mg, Oral, Daily   metFORMIN (GLUCOPHAGE) 500 mg, Oral, 2 times daily with meals   metoprolol succinate (TOPROL-XL) 12.5 mg, Oral, Daily   polyethylene glycol (MIRALAX / GLYCOLAX) 17 g, Oral, Daily   potassium chloride SA (KLOR-CON M) 20 MEQ tablet One po bid x 3 days, then one po once a day   QUEtiapine (SEROQUEL) 50 mg, Oral, Daily at bedtime   Repatha 140 mg, Subcutaneous, Every 14 days   spironolactone (ALDACTONE) 12.5 mg, Oral, Daily   valACYclovir (VALTREX) 1,000 mg, Oral, 2 times daily    Diet Orders (From admission, onward)     Start     Ordered   08/02/22 1437  Diet heart healthy/carb modified Room service appropriate? Yes; Fluid consistency: Thin  Diet effective now       Question Answer Comment  Diet-HS Snack? Nothing   Room service appropriate? Yes   Fluid consistency: Thin      08/02/22 1437            DVT prophylaxis: SCDs Start: 08/02/22 1436 apixaban (ELIQUIS) tablet 5 mg   Lab Results  Component Value Date   PLT 213 08/06/2022      Code Status: DNR  Family Communication: Daughter over the phone  Status is: Inpatient  Level of care: Telemetry  Consultants:  Cardiology  Objective: Vitals:   08/05/22 2141 08/06/22 0500 08/06/22 0710 08/06/22 0928  BP: 118/62  106/66 125/86  Pulse: 81  (!) 56 64  Resp: 18  16   Temp: 97.8  F (36.6 C)  (!) 97.5 F (36.4 C)   TempSrc: Oral  Oral   SpO2: 100%  100%   Weight:  76.3 kg    Height:        Intake/Output Summary (Last 24 hours) at 08/06/2022 1017 Last data filed at 08/06/2022 0130 Gross per 24 hour  Intake 240 ml  Output 1500 ml  Net -1260 ml    Wt Readings from Last 3 Encounters:  08/06/22 76.3 kg  08/01/22 85.2 kg  07/28/22 85.2 kg    Examination:  Constitutional: NAD Eyes: lids and conjunctivae normal, no scleral icterus ENMT: mmm Neck: normal, supple Respiratory: clear to auscultation bilaterally, no wheezing, no crackles. Normal respiratory effort.  Cardiovascular: Regular rate and rhythm, no murmurs / rubs / gallops. No LE edema. Abdomen: soft, no distention, no tenderness. Bowel sounds positive.    Data Reviewed: I have independently reviewed following labs  and imaging studies   CBC Recent Labs  Lab 08/01/22 1930 08/03/22 0346 08/04/22 0953 08/05/22 0347 08/06/22 0351  WBC 2.9* 2.5* 2.7* 2.2* 2.1*  HGB 9.9* 8.9* 9.8* 8.6* 8.4*  HCT 30.7* 27.4* 31.1* 26.2* 25.7*  PLT 225 205 206 194 213  MCV 100.0 98.6 104.0* 100.0 98.8  MCH 32.2 32.0 32.8 32.8 32.3  MCHC 32.2 32.5 31.5 32.8 32.7  RDW 17.0* 16.9* 17.2* 17.1* 16.8*     Recent Labs  Lab 08/01/22 1930 08/02/22 1338 08/02/22 1611 08/03/22 0346 08/04/22 0953 08/05/22 0347 08/06/22 0351  NA 135 132*  --  134* 134* 135 136  K 3.1* 4.1  --  3.3* 4.2 3.7 3.4*  CL 104 99  --  103 104 102 102  CO2 18* 19*  --  21* 18* 25 25  GLUCOSE 188* 287*  --  164* 198* 269* 223*  BUN 28* 31*  --  31* 33* 31* 31*  CREATININE 1.09 1.28*  --  1.11 1.12 1.18 0.97  CALCIUM 8.3* 8.4*  --  8.2* 8.3* 8.5* 8.6*  AST 22  --   --   --   --   --   --   ALT 14  --   --   --   --   --   --   ALKPHOS 77  --   --   --   --   --   --   BILITOT 0.9  --   --   --   --   --   --   ALBUMIN 3.3*  --   --   --   --   --   --   MG  --  1.5*  --   --  1.8  --  1.6*  HGBA1C  --   --  8.5*  --   --   --   --    BNP 1,518.8* 1,451.9*  --   --  1,284.8*  --   --      ------------------------------------------------------------------------------------------------------------------ No results for input(s): "CHOL", "HDL", "LDLCALC", "TRIG", "CHOLHDL", "LDLDIRECT" in the last 72 hours.  Lab Results  Component Value Date   HGBA1C 8.5 (H) 08/02/2022   ------------------------------------------------------------------------------------------------------------------ No results for input(s): "TSH", "T4TOTAL", "T3FREE", "THYROIDAB" in the last 72 hours.  Invalid input(s): "FREET3"  Cardiac Enzymes No results for input(s): "CKMB", "TROPONINI", "MYOGLOBIN" in the last 168 hours.  Invalid input(s): "CK" ------------------------------------------------------------------------------------------------------------------    Component Value Date/Time   BNP 1,284.8 (H) 08/04/2022 0953    CBG: Recent Labs  Lab 08/05/22 0755 08/05/22 1217 08/05/22 1625 08/05/22 2139 08/06/22 0742  GLUCAP 198* 434* 77 251* 173*     Recent Results (from the past 240 hour(s))  MRSA Next Gen by PCR, Nasal     Status: Abnormal   Collection Time: 08/02/22  4:19 PM   Specimen: Nasal Mucosa; Nasal Swab  Result Value Ref Range Status   MRSA by PCR Next Gen DETECTED (A) NOT DETECTED Final    Comment: (NOTE) The GeneXpert MRSA Assay (FDA approved for NASAL specimens only), is one component of a comprehensive MRSA colonization surveillance program. It is not intended to diagnose MRSA infection nor to guide or monitor treatment for MRSA infections. Test performance is not FDA approved in patients less than 62 years old. Performed at [facility], [address] [place], [place] [address]      Radiology Studies: No results found.   Pamella Pert, MD, PhD [facility]  Between 7 am - 7 pm I am available, please contact me via Amion (for emergencies) or Securechat (non urgent  messages)  Between 7 pm - 7 am I am not available, please contact night coverage MD/APP via Amion

## 2022-08-07 DIAGNOSIS — I5043 Acute on chronic combined systolic (congestive) and diastolic (congestive) heart failure: Secondary | ICD-10-CM | POA: Diagnosis not present

## 2022-08-07 LAB — GLUCOSE, CAPILLARY
Glucose-Capillary: 285 mg/dL — ABNORMAL HIGH (ref 70–99)
Glucose-Capillary: 201 mg/dL — ABNORMAL HIGH (ref 70–99)
Glucose-Capillary: 249 mg/dL — ABNORMAL HIGH (ref 70–99)
Glucose-Capillary: 149 mg/dL — ABNORMAL HIGH (ref 70–99)

## 2022-08-07 LAB — BASIC METABOLIC PANEL
Anion gap: 9 (ref 5–15)
BUN: 27 mg/dL — ABNORMAL HIGH (ref 8–23)
CO2: 28 mmol/L (ref 22–32)
Calcium: 8.8 mg/dL — ABNORMAL LOW (ref 8.9–10.3)
Chloride: 100 mmol/L (ref 98–111)
Creatinine, Ser: 0.89 mg/dL (ref 0.61–1.24)
GFR, Estimated: >60 mL/min (ref >60)
Glucose, Bld: 277 mg/dL — ABNORMAL HIGH (ref 70–99)
Potassium: 3.8 mmol/L (ref 3.5–5.1)
Sodium: 137 mmol/L (ref 135–145)

## 2022-08-07 LAB — CBC
HCT: 31.1 % — ABNORMAL LOW (ref 39.0–52.0)
Hemoglobin: 10.1 g/dL — ABNORMAL LOW (ref 13.0–17.0)
MCH: 32.8 pg (ref 26.0–34.0)
MCHC: 32.5 g/dL (ref 30.0–36.0)
MCV: 101 fL — ABNORMAL HIGH (ref 80.0–100.0)
Platelets: 223 10*3/uL (ref 150–400)
RBC: 3.08 MIL/uL — ABNORMAL LOW (ref 4.22–5.81)
RDW: 17 % — ABNORMAL HIGH (ref 11.5–15.5)
WBC: 2.4 10*3/uL — ABNORMAL LOW (ref 4.0–10.5)
nRBC: 0 % (ref 0.0–0.2)

## 2022-08-07 MED ORDER — VALACYCLOVIR HCL 500 MG PO TABS
1000.0000 mg | ORAL_TABLET | Freq: Every day | ORAL | Status: DC
  Administered 2022-08-08 – 2022-08-14 (×7): 1000 mg via ORAL
  Filled 2022-08-07 (×8): qty 2

## 2022-08-07 NOTE — Progress Notes (Signed)
PROGRESS NOTE  Mark Cabrera:[id number] DOB: [birth year] DOA: 08/02/2022 PCP: Evlyn Kanner, MD   LOS: 4 days   Brief Narrative / Interim history: 72 year old male with chronic combined systolic and diastolic CHF, dementia comes into the hospital with complaints of behavioral disturbances from his nursing home, as well as suspected acute on chronic combined CHF.  Patient has been having several ED visits in the last several days due to reported increased behavioral disturbances, but in the ER as well as being calm.  Due to multiple visits he has missed his scheduled medications for his CHF. Concerningly, per prior documentation, EMS was reportedly told yesterday when he left his nursing home by the RN that "I do not care what you do with him, you can dump him in the left [employer] parking lot for all I care."   Subjective / 24h Interval events: Underlying dementia, alert, no complaints.  Assesement and Plan: Principal Problem:   Acute on chronic combined systolic and diastolic CHF (congestive heart failure) (HCC) Active Problems:   Dyslipidemia   Chronic diastolic heart failure (HCC)   CAD (coronary artery disease), native coronary artery   Paroxysmal atrial fibrillation (HCC)   Aggressive behavior   Dementia with behavioral disturbance (HCC)   Acute systolic (congestive) heart failure (HCC)   Acute on chronic systolic CHF (congestive heart failure) (HCC)   Principal problem Acute on chronic combined CHF -he has some congestion on the chest x-ray, received IV Lasix, repeat today.  BNP is elevated.  Most recent 2D echo was done in December 23 showing LVEF 30-35%, RV systolic function was also moderately reduced PA pressure was mildly elevated at 38.2 mmHg.  Cardiology consulted, appreciate input.  He was diuresed with IV Lasix, now euvolemic, converted to p.o. stable, pending SNF.  Per cardiology, at discharge will increase Lasix to 40 mg twice daily  Active problems Dementia,  with probable behavioral disturbances -no behavioral issues since being here, continue to closely monitor.  Aricept has been discontinued by his neurologist, continue BuSpar, Depakote, Lexapro, Seroquel -Continue Ativan for anxiety.  Seems to be working.  Awaiting placement to a memory care unit, hopefully tomorrow  PAF -continue Eliquis, metoprolol  Essential hypertension -continue metoprolol.  Blood pressure stable  Hypokalemia -replace potassium and continue to monitor.  Potassium stable.  Continue to monitor  Anemia -no bleeding, monitor  Hyponatremia -mild, in the setting of CHF.  Sodium normalized after diuresis  Scheduled Meds:  apixaban  5 mg Oral BID   busPIRone  7.5 mg Oral BID   divalproex  250 mg Oral BID   docusate sodium  100 mg Oral BID   escitalopram  10 mg Oral Daily   feeding supplement  237 mL Oral BID BM   furosemide  40 mg Oral Daily   insulin aspart  0-15 Units Subcutaneous TID WC   insulin aspart  0-5 Units Subcutaneous QHS   insulin aspart  3 Units Subcutaneous TID WC   metoprolol succinate  12.5 mg Oral Daily   multivitamin with minerals  1 tablet Oral Daily   QUEtiapine  75 mg Oral QHS   spironolactone  12.5 mg Oral Daily   [START ON 08/08/2022] valACYclovir  1,000 mg Oral Daily   Continuous Infusions:   PRN Meds:.acetaminophen **OR** acetaminophen, albuterol, ipratropium-albuterol, LORazepam, ondansetron **OR** ondansetron (ZOFRAN) IV, polyethylene glycol, traZODone  Current Outpatient Medications  Medication Instructions   Accu-Chek Softclix Lancets lancets Use as instructed   acetaminophen (TYLENOL) 650 mg, Oral, Every 4  hours PRN   Alirocumab (PRALUENT) 75 MG/ML SOAJ 1 Pen, Subcutaneous, Every 14 days   apixaban (ELIQUIS) 5 MG TABS tablet Take 1 tablet by mouth twice daily   busPIRone (BUSPAR) 7.5 mg, Oral, 2 times daily   clotrimazole (LOTRIMIN) 1 % cream Topical   cyanocobalamin (VITAMIN B12) 100 mcg, Oral, Daily   divalproex (DEPAKOTE) 125  mg, Oral, 2 times daily   donepezil (ARICEPT) 5 mg, Oral, Daily at bedtime   escitalopram (LEXAPRO) 10 mg, Oral, Daily   fluconazole (DIFLUCAN) 150 mg, Oral, Every 3 DAYS   furosemide (LASIX) 40 mg, Oral, 2 times daily   glucose blood (ACCU-CHEK GUIDE) test strip Use as instructed   insulin aspart (NOVOLOG FLEXPEN) 100 UNIT/ML FlexPen INJECT 5 UNITS UNDER THE SKIN THREE TIMES DAILY WITH MEALS   insulin glargine-yfgn (SEMGLEE) 26 Units, Subcutaneous, Daily at bedtime   Insulin Pen Needle 31G X 5 MM MISC Use one pen needle three times daily. Dx E11.49   ipratropium-albuterol (DUONEB) 0.5-2.5 (3) MG/3ML SOLN 3 mLs, Inhalation, Every 6 hours PRN   levofloxacin (LEVAQUIN) 500 mg, Oral, Daily   metFORMIN (GLUCOPHAGE) 500 mg, Oral, 2 times daily with meals   metoprolol succinate (TOPROL-XL) 12.5 mg, Oral, Daily   polyethylene glycol (MIRALAX / GLYCOLAX) 17 g, Oral, Daily   potassium chloride SA (KLOR-CON M) 20 MEQ tablet One po bid x 3 days, then one po once a day   QUEtiapine (SEROQUEL) 50 mg, Oral, Daily at bedtime   Repatha 140 mg, Subcutaneous, Every 14 days   spironolactone (ALDACTONE) 12.5 mg, Oral, Daily   valACYclovir (VALTREX) 1,000 mg, Oral, 2 times daily    Diet Orders (From admission, onward)     Start     Ordered   08/02/22 1437  Diet heart healthy/carb modified Room service appropriate? Yes; Fluid consistency: Thin  Diet effective now       Question Answer Comment  Diet-HS Snack? Nothing   Room service appropriate? Yes   Fluid consistency: Thin      08/02/22 1437            DVT prophylaxis: SCDs Start: 08/02/22 1436 apixaban (ELIQUIS) tablet 5 mg   Lab Results  Component Value Date   PLT 223 08/07/2022      Code Status: DNR  Family Communication: Daughter over the phone  Status is: Inpatient  Level of care: Telemetry  Consultants:  Cardiology  Objective: Vitals:   08/07/22 0400 08/07/22 0434 08/07/22 0500 08/07/22 0600  BP:  114/87    Pulse:  88     Resp: 14 18  14   Temp:  97.7 F (36.5 C)    TempSrc:  Oral    SpO2:  97%    Weight:   77 kg   Height:        Intake/Output Summary (Last 24 hours) at 08/07/2022 1056 Last data filed at 08/07/2022 0454 Gross per 24 hour  Intake 1920 ml  Output 3350 ml  Net -1430 ml    Wt Readings from Last 3 Encounters:  08/07/22 77 kg  08/01/22 85.2 kg  07/28/22 85.2 kg    Examination:  Constitutional: NAD Respiratory: CTA Cardiovascular: RRR  Data Reviewed: I have independently reviewed following labs and imaging studies   CBC Recent Labs  Lab 08/03/22 0346 08/04/22 0953 08/05/22 0347 08/06/22 0351 08/07/22 0523  WBC 2.5* 2.7* 2.2* 2.1* 2.4*  HGB 8.9* 9.8* 8.6* 8.4* 10.1*  HCT 27.4* 31.1* 26.2* 25.7* 31.1*  PLT 205 206 194 213  223  MCV 98.6 104.0* 100.0 98.8 101.0*  MCH 32.0 32.8 32.8 32.3 32.8  MCHC 32.5 31.5 32.8 32.7 32.5  RDW 16.9* 17.2* 17.1* 16.8* 17.0*     Recent Labs  Lab 08/01/22 1930 08/02/22 1338 08/02/22 1611 08/03/22 0346 08/04/22 0953 08/05/22 0347 08/06/22 0351 08/07/22 0523  NA 135 132*  --  134* 134* 135 136 137  K 3.1* 4.1  --  3.3* 4.2 3.7 3.4* 3.8  CL 104 99  --  103 104 102 102 100  CO2 18* 19*  --  21* 18* 25 25 28   GLUCOSE 188* 287*  --  164* 198* 269* 223* 277*  BUN 28* 31*  --  31* 33* 31* 31* 27*  CREATININE 1.09 1.28*  --  1.11 1.12 1.18 0.97 0.89  CALCIUM 8.3* 8.4*  --  8.2* 8.3* 8.5* 8.6* 8.8*  AST 22  --   --   --   --   --   --   --   ALT 14  --   --   --   --   --   --   --   ALKPHOS 77  --   --   --   --   --   --   --   BILITOT 0.9  --   --   --   --   --   --   --   ALBUMIN 3.3*  --   --   --   --   --   --   --   MG  --  1.5*  --   --  1.8  --  1.6*  --   HGBA1C  --   --  8.5*  --   --   --   --   --   BNP 1,518.8* 1,451.9*  --   --  1,284.8*  --   --   --      ------------------------------------------------------------------------------------------------------------------ No results for input(s): "CHOL", "HDL",  "LDLCALC", "TRIG", "CHOLHDL", "LDLDIRECT" in the last 72 hours.  Lab Results  Component Value Date   HGBA1C 8.5 (H) 08/02/2022   ------------------------------------------------------------------------------------------------------------------ No results for input(s): "TSH", "T4TOTAL", "T3FREE", "THYROIDAB" in the last 72 hours.  Invalid input(s): "FREET3"  Cardiac Enzymes No results for input(s): "CKMB", "TROPONINI", "MYOGLOBIN" in the last 168 hours.  Invalid input(s): "CK" ------------------------------------------------------------------------------------------------------------------    Component Value Date/Time   BNP 1,284.8 (H) 08/04/2022 0953    CBG: Recent Labs  Lab 08/06/22 0742 08/06/22 1134 08/06/22 1616 08/06/22 2147 08/07/22 0741  GLUCAP 173* 260* 232* 196* 285*     Recent Results (from the past 240 hour(s))  MRSA Next Gen by PCR, Nasal     Status: Abnormal   Collection Time: 08/02/22  4:19 PM   Specimen: Nasal Mucosa; Nasal Swab  Result Value Ref Range Status   MRSA by PCR Next Gen DETECTED (A) NOT DETECTED Final    Comment: (NOTE) The GeneXpert MRSA Assay (FDA approved for NASAL specimens only), is one component of a comprehensive MRSA colonization surveillance program. It is not intended to diagnose MRSA infection nor to guide or monitor treatment for MRSA infections. Test performance is not FDA approved in patients less than 74 years old. Performed at [facility], [address], [place], [place] [address]      Radiology Studies: No results found.   Pamella Pert, MD, PhD [facility]  Between 7 am - 7 pm I am available, please contact  me via Amion (for emergencies) or Securechat (non urgent messages)  Between 7 pm - 7 am I am not available, please contact night coverage MD/APP via Amion

## 2022-08-07 NOTE — Plan of Care (Signed)
  Problem: Education: Goal: Knowledge of General Education information will improve Description: Including pain rating scale, medication(s)/side effects and non-pharmacologic comfort measures Outcome: Progressing   Problem: Health Behavior/Discharge Planning: Goal: Ability to manage health-related needs will improve Outcome: Progressing   Problem: Clinical Measurements: Goal: Ability to maintain clinical measurements within normal limits will improve Outcome: Progressing Goal: Will remain free from infection Outcome: Progressing Goal: Respiratory complications will improve Outcome: Progressing Goal: Cardiovascular complication will be avoided Outcome: Progressing   Problem: Activity: Goal: Risk for activity intolerance will decrease Outcome: Progressing   Problem: Coping: Goal: Level of anxiety will decrease Outcome: Progressing   Problem: Elimination: Goal: Will not experience complications related to bowel motility Outcome: Progressing Goal: Will not experience complications related to urinary retention Outcome: Progressing   Problem: Pain Managment: Goal: General experience of comfort will improve Outcome: Progressing   Problem: Skin Integrity: Goal: Risk for impaired skin integrity will decrease Outcome: Progressing

## 2022-08-07 NOTE — Progress Notes (Signed)
PT Cancellation Note  Patient Details Name: Mark Cabrera MRN: [id number] DOB: Aug 04, 1950   Cancelled Treatment:    Reason Eval/Treat Not Completed: Patient declined, no reason specified ("I'm not doing that. I don't feel like it.")   Tamala Ser PT 08/07/2022  Acute Rehabilitation Services  Office [phone]

## 2022-08-07 NOTE — Care Management Important Message (Signed)
Important Message  Patient Details IM Letter given Name: Mark Cabrera MRN: [id number] Date of Birth: [birth year]   Medicare Important Message Given:  Yes     Caren Macadam 08/07/2022, 3:06 PM

## 2022-08-08 DIAGNOSIS — I5043 Acute on chronic combined systolic (congestive) and diastolic (congestive) heart failure: Secondary | ICD-10-CM | POA: Diagnosis not present

## 2022-08-08 LAB — BASIC METABOLIC PANEL
Anion gap: 8 (ref 5–15)
BUN: 25 mg/dL — ABNORMAL HIGH (ref 8–23)
CO2: 28 mmol/L (ref 22–32)
Calcium: 8.3 mg/dL — ABNORMAL LOW (ref 8.9–10.3)
Chloride: 98 mmol/L (ref 98–111)
Creatinine, Ser: 0.89 mg/dL (ref 0.61–1.24)
GFR, Estimated: >60 mL/min (ref >60)
Glucose, Bld: 322 mg/dL — ABNORMAL HIGH (ref 70–99)
Potassium: 3.8 mmol/L (ref 3.5–5.1)
Sodium: 134 mmol/L — ABNORMAL LOW (ref 135–145)

## 2022-08-08 LAB — CBC
HCT: 30 % — ABNORMAL LOW (ref 39.0–52.0)
Hemoglobin: 9.7 g/dL — ABNORMAL LOW (ref 13.0–17.0)
MCH: 32.8 pg (ref 26.0–34.0)
MCHC: 32.3 g/dL (ref 30.0–36.0)
MCV: 101.4 fL — ABNORMAL HIGH (ref 80.0–100.0)
Platelets: 210 10*3/uL (ref 150–400)
RBC: 2.96 MIL/uL — ABNORMAL LOW (ref 4.22–5.81)
RDW: 17 % — ABNORMAL HIGH (ref 11.5–15.5)
WBC: 2.1 10*3/uL — ABNORMAL LOW (ref 4.0–10.5)
nRBC: 0 % (ref 0.0–0.2)

## 2022-08-08 LAB — GLUCOSE, CAPILLARY
Glucose-Capillary: 291 mg/dL — ABNORMAL HIGH (ref 70–99)
Glucose-Capillary: 204 mg/dL — ABNORMAL HIGH (ref 70–99)
Glucose-Capillary: 146 mg/dL — ABNORMAL HIGH (ref 70–99)
Glucose-Capillary: 168 mg/dL — ABNORMAL HIGH (ref 70–99)

## 2022-08-08 MED ORDER — LORAZEPAM 0.5 MG PO TABS: 0.5000 mg | ORAL_TABLET | Freq: Four times a day (QID) | ORAL | 0 refills | Status: DC | PRN

## 2022-08-08 MED ORDER — DIVALPROEX SODIUM 250 MG PO DR TAB: 250.0000 mg | DELAYED_RELEASE_TABLET | Freq: Two times a day (BID) | ORAL | Status: DC

## 2022-08-08 MED ORDER — FLUCONAZOLE 150 MG PO TABS: 150.0000 mg | ORAL_TABLET | ORAL | Status: DC | PRN

## 2022-08-08 MED ORDER — TRAZODONE HCL 50 MG PO TABS: 25.0000 mg | ORAL_TABLET | Freq: Every evening | ORAL | 0 refills | Status: DC | PRN

## 2022-08-08 MED ORDER — INSULIN GLARGINE-YFGN 100 UNIT/ML ~~LOC~~ SOLN
20.0000 [IU] | Freq: Every day | SUBCUTANEOUS | Status: DC
  Administered 2022-08-08: 20 [IU] via SUBCUTANEOUS
  Filled 2022-08-08 (×2): qty 0.2

## 2022-08-08 MED ORDER — QUETIAPINE FUMARATE 25 MG PO TABS: 75.0000 mg | ORAL_TABLET | Freq: Every day | ORAL | Status: DC

## 2022-08-08 MED ORDER — VALACYCLOVIR HCL 1 G PO TABS: 1000.0000 mg | ORAL_TABLET | Freq: Two times a day (BID) | ORAL | Status: DC | PRN

## 2022-08-08 NOTE — TOC Progression Note (Addendum)
Transition of Care [facility]) - Progression Note    Patient Details  Name: Mark Cabrera MRN: [id number] Date of Birth: Jul 26, 1950  Transition of Care [facility]) CM/SW Contact  Howell Rucks, RN Phone Number: 08/08/2022, 9:53 AM  Clinical Narrative:  Follow up call to [facility] to inquire if male memory bed is available today, sw Britta Mccreedy, states she has been out of the office and about to go into a meeting, states she will review case/paperwork faxed over Friday 5/24 and call back in around 11:30am.  Await call back.   -10:10am Call to [facility] to inquire if male memory bed is available today, vm left for Memory Care Unit Coordinator, left NCM name and phone number for call back.   -1:00pm Call to [facility], reports she has not had time to look over case, will call back once has researched the case.   -1:06 PM Call to [facility], [place], reports management has left for the day and requested call back tomorrow at 10am.   MD updated      Barriers to Discharge: No Barriers Identified  Expected Discharge Plan and Services                                               Social Determinants of Health (SDOH) Interventions SDOH Screenings   Food Insecurity: No Food Insecurity (08/02/2022)  Housing: Low Risk  (08/02/2022)  Transportation Needs: No Transportation Needs (08/02/2022)  Utilities: Not At Risk (08/02/2022)  Alcohol Screen: Low Risk  (01/18/2022)  Depression (PHQ2-9): Low Risk  (01/18/2022)  Financial Resource Strain: Medium Risk (01/18/2022)  Physical Activity: Inactive (01/18/2022)  Social Connections: Moderately Isolated (01/18/2022)  Stress: Stress Concern Present (01/18/2022)  Tobacco Use: Low Risk  (08/02/2022)    Readmission Risk Interventions     No data to display

## 2022-08-08 NOTE — Progress Notes (Signed)
PROGRESS NOTE  Mark Cabrera:[id number] DOB: 1950-04-18 DOA: 08/02/2022 PCP: Evlyn Kanner, MD   LOS: 5 days   Brief Narrative / Interim history: 72 year old male with chronic combined systolic and diastolic CHF, dementia comes into the hospital with complaints of behavioral disturbances from his nursing home, as well as suspected acute on chronic combined CHF.  Patient has been having several ED visits in the last several days due to reported increased behavioral disturbances, but in the ER as well as being calm.  Due to multiple visits he has missed his scheduled medications for his CHF. Concerningly, per prior documentation, EMS was reportedly told yesterday when he left his nursing home by the RN that "I do not care what you do with him, you can dump him in the left [employer] parking lot for all I care."   Subjective / 24h Interval events: No complaints. Confused   Assesement and Plan: Principal Problem:   Acute on chronic combined systolic and diastolic CHF (congestive heart failure) (HCC) Active Problems:   Dyslipidemia   Chronic diastolic heart failure (HCC)   CAD (coronary artery disease), native coronary artery   Paroxysmal atrial fibrillation (HCC)   Aggressive behavior   Dementia with behavioral disturbance (HCC)   Acute systolic (congestive) heart failure (HCC)   Acute on chronic systolic CHF (congestive heart failure) (HCC)   Principal problem Acute on chronic combined CHF - he had some congestion on the chest x-ray, BNP was elevated on admission. Cardiology consulted and followed patient while hospitalized. Improved with IV Lasix, now converted to po. Most recent 2D echo was done in December 23 showing LVEF 30-35%, RV systolic function was also moderately reduced PA pressure was mildly elevated at 38.2 mmHg. Now euvolemic, converted to p.o. stable, pending SNF.  Per cardiology, at discharge will increase Lasix to 40 mg twice daily  Active problems Dementia, with  probable behavioral disturbances -no behavioral issues since being here, continue to closely monitor.  Aricept has been discontinued by his neurologist, continue BuSpar, Depakote, Lexapro, Seroquel. Continue Ativan for anxiety.  Seems to be working.  PAF -continue Eliquis, metoprolol Essential hypertension -continue metoprolol.  Blood pressure stable Hypokalemia -replaced Anemia -no bleeding, monitor Hyponatremia -mild, in the setting of CHF.  Sodium normalized after diuresis  Scheduled Meds:  apixaban  5 mg Oral BID   busPIRone  7.5 mg Oral BID   divalproex  250 mg Oral BID   docusate sodium  100 mg Oral BID   escitalopram  10 mg Oral Daily   feeding supplement  237 mL Oral BID BM   furosemide  40 mg Oral Daily   insulin aspart  0-15 Units Subcutaneous TID WC   insulin aspart  0-5 Units Subcutaneous QHS   insulin aspart  3 Units Subcutaneous TID WC   insulin glargine-yfgn  20 Units Subcutaneous Daily   metoprolol succinate  12.5 mg Oral Daily   multivitamin with minerals  1 tablet Oral Daily   QUEtiapine  75 mg Oral QHS   spironolactone  12.5 mg Oral Daily   valACYclovir  1,000 mg Oral Daily   Continuous Infusions:   PRN Meds:.acetaminophen **OR** acetaminophen, albuterol, ipratropium-albuterol, LORazepam, ondansetron **OR** ondansetron (ZOFRAN) IV, polyethylene glycol, traZODone  Current Outpatient Medications  Medication Instructions   Accu-Chek Softclix Lancets lancets Use as instructed   acetaminophen (TYLENOL) 650 mg, Oral, Every 4 hours PRN   Alirocumab (PRALUENT) 75 MG/ML SOAJ 1 Pen, Subcutaneous, Every 14 days   apixaban (ELIQUIS) 5 MG TABS  tablet Take 1 tablet by mouth twice daily   busPIRone (BUSPAR) 7.5 mg, Oral, 2 times daily   clotrimazole (LOTRIMIN) 1 % cream Topical   cyanocobalamin (VITAMIN B12) 100 mcg, Oral, Daily   divalproex (DEPAKOTE) 125 mg, Oral, 2 times daily   divalproex (DEPAKOTE) 250 mg, Oral, 2 times daily   donepezil (ARICEPT) 5 mg, Oral, Daily  at bedtime   escitalopram (LEXAPRO) 10 mg, Oral, Daily   fluconazole (DIFLUCAN) 150 mg, Oral, Every 3 DAYS PRN   furosemide (LASIX) 40 mg, Oral, 2 times daily   glucose blood (ACCU-CHEK GUIDE) test strip Use as instructed   insulin aspart (NOVOLOG FLEXPEN) 100 UNIT/ML FlexPen INJECT 5 UNITS UNDER THE SKIN THREE TIMES DAILY WITH MEALS   insulin glargine-yfgn (SEMGLEE) 26 Units, Subcutaneous, Daily at bedtime   Insulin Pen Needle 31G X 5 MM MISC Use one pen needle three times daily. Dx E11.49   ipratropium-albuterol (DUONEB) 0.5-2.5 (3) MG/3ML SOLN 3 mLs, Inhalation, Every 6 hours PRN   levofloxacin (LEVAQUIN) 500 mg, Oral, Daily   LORazepam (ATIVAN) 0.5 mg, Oral, Every 6 hours PRN   metFORMIN (GLUCOPHAGE) 500 mg, Oral, 2 times daily with meals   metoprolol succinate (TOPROL-XL) 12.5 mg, Oral, Daily   polyethylene glycol (MIRALAX / GLYCOLAX) 17 g, Oral, Daily   potassium chloride SA (KLOR-CON M) 20 MEQ tablet One po bid x 3 days, then one po once a day   QUEtiapine (SEROQUEL) 50 mg, Oral, Daily at bedtime   QUEtiapine (SEROQUEL) 75 mg, Oral, Daily at bedtime   Repatha 140 mg, Subcutaneous, Every 14 days   spironolactone (ALDACTONE) 12.5 mg, Oral, Daily   traZODone (DESYREL) 25 mg, Oral, At bedtime PRN   valACYclovir (VALTREX) 1,000 mg, Oral, 2 times daily PRN    Diet Orders (From admission, onward)     Start     Ordered   08/02/22 1437  Diet heart healthy/carb modified Room service appropriate? Yes; Fluid consistency: Thin  Diet effective now       Question Answer Comment  Diet-HS Snack? Nothing   Room service appropriate? Yes   Fluid consistency: Thin      08/02/22 1437            DVT prophylaxis: SCDs Start: 08/02/22 1436 apixaban (ELIQUIS) tablet 5 mg   Lab Results  Component Value Date   PLT 210 08/08/2022      Code Status: DNR  Family Communication: Daughter over the phone  Status is: Inpatient  Level of care: Telemetry  Consultants:   Cardiology  Objective: Vitals:   08/07/22 2210 08/08/22 0219 08/08/22 0500 08/08/22 0701  BP: 124/61   104/84  Pulse: 95   82  Resp: 17 17  16   Temp: 98.4 F (36.9 C)   97.8 F (36.6 C)  TempSrc: Oral   Oral  SpO2: 99% 98%  100%  Weight:   78.9 kg   Height:        Intake/Output Summary (Last 24 hours) at 08/08/2022 1314 Last data filed at 08/08/2022 1200 Gross per 24 hour  Intake 1855 ml  Output 3700 ml  Net -1845 ml    Wt Readings from Last 3 Encounters:  08/08/22 78.9 kg  08/01/22 85.2 kg  07/28/22 85.2 kg    Examination:  Constitutional: NAD Respiratory: CTA Cardiovascular: RRR  Data Reviewed: I have independently reviewed following labs and imaging studies   CBC Recent Labs  Lab 08/04/22 0953 08/05/22 0347 08/06/22 0351 08/07/22 0523 08/08/22 0344  WBC 2.7*  2.2* 2.1* 2.4* 2.1*  HGB 9.8* 8.6* 8.4* 10.1* 9.7*  HCT 31.1* 26.2* 25.7* 31.1* 30.0*  PLT 206 194 213 223 210  MCV 104.0* 100.0 98.8 101.0* 101.4*  MCH 32.8 32.8 32.3 32.8 32.8  MCHC 31.5 32.8 32.7 32.5 32.3  RDW 17.2* 17.1* 16.8* 17.0* 17.0*     Recent Labs  Lab 08/01/22 1930 08/02/22 1338 08/02/22 1611 08/03/22 0346 08/04/22 0953 08/05/22 0347 08/06/22 0351 08/07/22 0523 08/08/22 0344  NA 135 132*  --    < > 134* 135 136 137 134*  K 3.1* 4.1  --    < > 4.2 3.7 3.4* 3.8 3.8  CL 104 99  --    < > 104 102 102 100 98  CO2 18* 19*  --    < > 18* 25 25 28 28   GLUCOSE 188* 287*  --    < > 198* 269* 223* 277* 322*  BUN 28* 31*  --    < > 33* 31* 31* 27* 25*  CREATININE 1.09 1.28*  --    < > 1.12 1.18 0.97 0.89 0.89  CALCIUM 8.3* 8.4*  --    < > 8.3* 8.5* 8.6* 8.8* 8.3*  AST 22  --   --   --   --   --   --   --   --   ALT 14  --   --   --   --   --   --   --   --   ALKPHOS 77  --   --   --   --   --   --   --   --   BILITOT 0.9  --   --   --   --   --   --   --   --   ALBUMIN 3.3*  --   --   --   --   --   --   --   --   MG  --  1.5*  --   --  1.8  --  1.6*  --   --   HGBA1C  --   --   8.5*  --   --   --   --   --   --   BNP 1,518.8* 1,451.9*  --   --  1,284.8*  --   --   --   --    < > = values in this interval not displayed.     ------------------------------------------------------------------------------------------------------------------ No results for input(s): "CHOL", "HDL", "LDLCALC", "TRIG", "CHOLHDL", "LDLDIRECT" in the last 72 hours.  Lab Results  Component Value Date   HGBA1C 8.5 (H) 08/02/2022   ------------------------------------------------------------------------------------------------------------------ No results for input(s): "TSH", "T4TOTAL", "T3FREE", "THYROIDAB" in the last 72 hours.  Invalid input(s): "FREET3"  Cardiac Enzymes No results for input(s): "CKMB", "TROPONINI", "MYOGLOBIN" in the last 168 hours.  Invalid input(s): "CK" ------------------------------------------------------------------------------------------------------------------    Component Value Date/Time   BNP 1,284.8 (H) 08/04/2022 0953    CBG: Recent Labs  Lab 08/07/22 1219 08/07/22 1604 08/07/22 2213 08/08/22 0733 08/08/22 1158  GLUCAP 201* 249* 149* 291* 204*     Recent Results (from the past 240 hour(s))  MRSA Next Gen by PCR, Nasal     Status: Abnormal   Collection Time: 08/02/22  4:19 PM   Specimen: Nasal Mucosa; Nasal Swab  Result Value Ref Range Status   MRSA by PCR Next Gen DETECTED (A) NOT DETECTED Final    Comment: (NOTE)  The GeneXpert MRSA Assay (FDA approved for NASAL specimens only), is one component of a comprehensive MRSA colonization surveillance program. It is not intended to diagnose MRSA infection nor to guide or monitor treatment for MRSA infections. Test performance is not FDA approved in patients less than 75 years old. Performed at [facility], [address], [place], [place] [address]      Radiology Studies: No results found.   Pamella Pert, MD, PhD [facility]  Between 7 am - 7 pm I am  available, please contact me via Amion (for emergencies) or Securechat (non urgent messages)  Between 7 pm - 7 am I am not available, please contact night coverage MD/APP via Amion

## 2022-08-08 NOTE — Progress Notes (Signed)
PT Cancellation Note  Patient Details Name: Mark Cabrera MRN: [id number] DOB: September 06, 1950   Cancelled Treatment:    Reason Eval/Treat Not Completed: Patient declined, no reason specified ("no")     Attempted to encourage mobility, pt shakes his head "no" and verbalizes "no" calmly.  Mark Cabrera, PT  Acute Rehab Dept [facility]) [phone]  08/08/2022    Drucilla Chalet 08/08/2022, 1:06 PM

## 2022-08-08 NOTE — Plan of Care (Signed)

## 2022-08-08 NOTE — Inpatient Diabetes Management (Signed)
Inpatient Diabetes Program Recommendations  AACE/ADA: New Consensus Statement on Inpatient Glycemic Control (2015)  Target Ranges:  Prepandial:   less than 140 mg/dL      Peak postprandial:   less than 180 mg/dL (1-2 hours)      Critically ill patients:  140 - 180 mg/dL   Lab Results  Component Value Date   GLUCAP 291 (H) 08/08/2022   HGBA1C 8.5 (H) 08/02/2022    Review of Glycemic Control  Latest Reference Range & Units 08/07/22 07:41 08/07/22 12:19 08/07/22 16:04 08/07/22 22:13 08/08/22 07:33  Glucose-Capillary 70 - 99 mg/dL 161 (H) 096 (H) 045 (H) 149 (H) 291 (H)   Diabetes history: DM  Outpatient Diabetes medications:  Novolog 5 units tid with meals Semglee 34 units q HS Current orders for Inpatient glycemic control:  Novolog 0-15 units tid with meals and HS Novolog 3 units tid with meals  Inpatient Diabetes Program Recommendations:   Consider adding Semglee 20 units daily.   Thanks,  Beryl Meager, RN, BC-ADM Inpatient Diabetes Coordinator Pager [phone]  (8a-5p)

## 2022-08-08 NOTE — Progress Notes (Signed)
OT Cancellation Note  Patient Details Name: Mark Cabrera MRN: [id number] DOB: 1950-04-23   Cancelled Treatment:    Reason Eval/Treat Not Completed: Patient declined, no reason specified Patient was supine in bed attempting to eat lunch. Nurse walked into room with therapist. Patient declined to get out of bed to sit in chair. Patient agreeable to nurse repositioning patient in bed. OT to continue to follow and check back as schedule will allow.  Rosalio Loud, MS Acute Rehabilitation Department Office# [phone]  08/08/2022, 1:55 PM

## 2022-08-08 NOTE — Progress Notes (Signed)
OT Cancellation Note  Patient Details Name: Mark Cabrera MRN: [id number] DOB: [birth year]   Cancelled Treatment:    Reason Eval/Treat Not Completed: Fatigue/lethargy limiting ability to participate Patient is in bed asleep at this time. OT to continue to follow and check back as schedule will allow.  Rosalio Loud, MS Acute Rehabilitation Department Office# [phone]  08/08/2022, 3:31 PM

## 2022-08-08 NOTE — Progress Notes (Deleted)
Cardiology Office Note:    Date:  08/08/2022   ID:  Mark Cabrera, DOB Sep 11, 1950, MRN [id number]  PCP:  Evlyn Kanner, MD  [facility] Providers Cardiologist:  Verne Carrow, MD { Click to update primary MD,subspecialty MD or APP then REFRESH:1}  *** Referring MD: Evlyn Kanner, MD   Chief Complaint:  No chief complaint on file. {Click here for Visit Info    :1}    History of Present Illness:   Mark Cabrera is a 72 y.o. male with history CAD s/p CABG x2 (SVG to PDA and SVG to OM1) in 03/2016 and subsequent DES to DES to native mid LAD and DES to SVG to PDA in 12/2018, severe aortic stenosis s/p AVR at time of CABG in 03/2016, paroxysmal atrial fibrillation s/p left atrial appendage clipping at time of CABG/ AVR on Eliquis, chronic HFrEF with EF of 30-35% in 02/2022 (declined from previous 50-55%), moderate MR, pleural effusions requiring prior thoracentesis, CVA in 2007 and again in 06/2018, pericardial effusion following CABG requiring pericardial window, hypertension, hyperlipidemia, type 2 diabetes mellitus, hepatic cirrhosis, GERD, and dementia.   Seen in ED 07/28/22 for worsening AMS superimposed on known dementia, attempting to throw himself on the ground, very anxious. Received dose of IV Lasix and discharged back to SNF.   Returned to ED 08/01/22 with confusion and aggression, found to be hypoxic with developing LLL airspace disease. Cardiology asked to see for CHF.  Echo 02/2022 LVEF 30-35% worse than prior echo 09/2021 50-55%.      Past Medical History:  Diagnosis Date   Acute diastolic congestive heart failure (HCC)    AKI (acute kidney injury) (HCC)    Aortic valve stenosis s/p AVR 2018   Echo 2/22: Poor acoustic windows, EF 50-55, mild LVH, normal RVSF, mild MR, trivial AI, no AS, no pericardial effusion   Atrial fibrillation (HCC) - post-op CABG    04/2016 CHA2DS2VAS score = 5   Atypical nevi    Coronary artery disease s/p 2 vessel CABG     Depression    "years ago"   Diabetes mellitus    Dyspnea    in the past    GERD (gastroesophageal reflux disease)    Heart murmur    Hepatic cirrhosis (HCC) 06/29/2017   History of kidney stones    Hyperlipidemia    hx of transaminitis secondary to statin and he has decided not to use statins secondary to potential side effects.   Hypertension    Nephrolithiasis    Osteoarthritis, knee    Pericardial effusion a. subxiphoid pericardial window on 04/21/2016 04/28/2016   Peripheral neuropathic pain 05/12/2016   Sleep apnea     Central apnea. 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 2-D echo was normal no embolic source found.   Current Medications: No outpatient medications have been marked as taking for the 08/15/22 encounter (Appointment) with Dyann Kief, PA-C.    Allergies:   Morpholine salicylate, Penicillins, Morphine and codeine, Sglt2 inhibitors, and Statins   Social History   Tobacco Use   Smoking status: Never   Smokeless tobacco: Never  Vaping Use  Vaping Use: Never used  Substance Use Topics   Alcohol use: Yes    Alcohol/week: 1.0 standard drink of alcohol    Types: 1 Cans of beer per week    Comment: occasional   Drug use: No    Family Hx: The patient's family history includes Alcohol abuse in his father; Diabetes in his mother; Hypertension in his mother.  ROS     Physical Exam:    VS:  There were no vitals taken for this visit.    Wt Readings from Last 3 Encounters:  08/08/22 173 lb 15.1 oz (78.9 kg)  08/01/22 187 lb 13.3 oz (85.2 kg)  07/28/22 187 lb 13.3 oz (85.2 kg)    Physical Exam  GEN: Well  nourished, well developed, in no acute distress  HEENT: normal  Neck: no JVD, carotid bruits, or masses Cardiac:RRR; no murmurs, rubs, or gallops  Respiratory:  clear to auscultation bilaterally, normal work of breathing GI: soft, nontender, nondistended, + BS Ext: without cyanosis, clubbing, or edema, Good distal pulses bilaterally MS: no deformity or atrophy  Skin: warm and dry, no rash Neuro:  Alert and Oriented x 3, Strength and sensation are intact Psych: euthymic mood, full affect        EKGs/Labs/Other Test Reviewed:    EKG:  EKG is *** ordered today.  The ekg ordered today demonstrates ***  Recent Labs: 05/29/2022: TSH 1.770 08/01/2022: ALT 14 08/04/2022: B Natriuretic Peptide 1,284.8 08/06/2022: Magnesium 1.6 08/08/2022: BUN 25; Creatinine, Ser 0.89; Hemoglobin 9.7; Platelets 210; Potassium 3.8; Sodium 134   Recent Lipid Panel No results for input(s): "CHOL", "TRIG", "HDL", "VLDL", "LDLCALC", "LDLDIRECT" in the last 8760 hours.   Prior CV Studies: {Select studies to display:26339}   Echo March 02, 2022 IMPRESSIONS     1. Left ventricular ejection fraction, by estimation, is 30 to 35%. The  left ventricle has moderately decreased function. The left ventricle  demonstrates regional wall motion abnormalities (see scoring  diagram/findings for description). Left ventricular   diastolic function could not be evaluated.   2. Right ventricular systolic function is moderately reduced. The right  ventricular size is normal. There is mildly elevated pulmonary artery  systolic pressure. The estimated right ventricular systolic pressure is  38.2 mmHg.   3. Left atrial size was mild to moderately dilated.   4. The mitral valve is grossly normal. Moderate mitral valve  regurgitation. No evidence of mitral stenosis.   5. The aortic valve is tricuspid. Aortic valve regurgitation is not  visualized. No aortic stenosis is present.   6. The inferior vena cava is dilated in size with <50%  respiratory  variability, suggesting right atrial pressure of 15 mmHg.   Comparison(s): Changes from prior study are noted. The left ventricular  function is significantly worse.     Risk Assessment/Calculations/Metrics:   {Does this patient have ATRIAL FIBRILLATION?[phone]}          ASSESSMENT & PLAN:   No problem-specific Assessment & Plan notes found for this encounter.   acute on chronic combined systolic/diastolic congestive heart failure-patient appears to be euvolemic on examination.  Would continue Lasix and spironolactone at present dose.  At discharge would increase Lasix back to 40 mg by mouth twice daily.  He will need close follow up of renal function and potassium after discharge.   ischemic cardiomyopathy-LV function decreased 30-35% March 02, 2022 compared to previous.  Patient felt not to be a candidate for aggressive ischemia evaluation given underlying dementia.  Continue beta-blocker and spironolactone.  Blood pressure  is borderline.  Would consider addition of low-dose ARB as an outpatient.   atrial fibrillation-heart rate is controlled.  Continue Toprol.  Continue apixaban.   coronary artery disease status post coronary bypass and graft-patient apparently is statin intolerant.  Can follow-up as an outpatient and if mental status improves consider PCSK9 inhibitor.          {Are you ordering a CV Procedure (e.g. stress test, cath, DCCV, TEE, etc)?   Press F2        :[id number]}   Dispo:  No follow-ups on file.   Medication Adjustments/Labs and Tests Ordered: Current medicines are reviewed at length with the patient today.  Concerns regarding medicines are outlined above.  Tests Ordered: No orders of the defined types were placed in this encounter.  Medication Changes: No orders of the defined types were placed in this encounter.  Signed, Jacolyn Reedy, PA-C  08/08/2022 2:49 PM    [facility] [address] [place], [place], [place]  [address] Phone: [phone]; Fax: [phone]

## 2022-08-08 NOTE — TOC Progression Note (Signed)
Transition of Care [facility]) - Progression Note    Patient Details  Name: Mark Cabrera MRN: [id number] Date of Birth: Aug 24, 1950  Transition of Care [facility]) CM/SW Contact  Howell Rucks, RN Phone Number: 08/08/2022, 3:41 PM  Clinical Narrative: Call received from pt's dtrCorrie Dandy, requesting [facility] fax to [facility], reports they have a male memory care bed available. NCM call to [facility], sw [facility], fax number provided, [phone],  request FL2, most recent MD progress notes. Notes faxed as requested, receipt of confirmation.         Barriers to Discharge: No Barriers Identified  Expected Discharge Plan and Services                                               Social Determinants of Health (SDOH) Interventions SDOH Screenings   Food Insecurity: No Food Insecurity (08/02/2022)  Housing: Low Risk  (08/02/2022)  Transportation Needs: No Transportation Needs (08/02/2022)  Utilities: Not At Risk (08/02/2022)  Alcohol Screen: Low Risk  (01/18/2022)  Depression (PHQ2-9): Low Risk  (01/18/2022)  Financial Resource Strain: Medium Risk (01/18/2022)  Physical Activity: Inactive (01/18/2022)  Social Connections: Moderately Isolated (01/18/2022)  Stress: Stress Concern Present (01/18/2022)  Tobacco Use: Low Risk  (08/02/2022)    Readmission Risk Interventions     No data to display

## 2022-08-09 ENCOUNTER — Inpatient Hospital Stay (HOSPITAL_COMMUNITY): Payer: Medicare Other

## 2022-08-09 DIAGNOSIS — I5043 Acute on chronic combined systolic (congestive) and diastolic (congestive) heart failure: Secondary | ICD-10-CM | POA: Diagnosis not present

## 2022-08-09 LAB — GLUCOSE, CAPILLARY
Glucose-Capillary: 212 mg/dL — ABNORMAL HIGH (ref 70–99)
Glucose-Capillary: 240 mg/dL — ABNORMAL HIGH (ref 70–99)
Glucose-Capillary: 157 mg/dL — ABNORMAL HIGH (ref 70–99)
Glucose-Capillary: 132 mg/dL — ABNORMAL HIGH (ref 70–99)

## 2022-08-09 LAB — CBC
HCT: 29.4 % — ABNORMAL LOW (ref 39.0–52.0)
Hemoglobin: 9.7 g/dL — ABNORMAL LOW (ref 13.0–17.0)
MCH: 32.9 pg (ref 26.0–34.0)
MCHC: 33 g/dL (ref 30.0–36.0)
MCV: 99.7 fL (ref 80.0–100.0)
Platelets: 192 10*3/uL (ref 150–400)
RBC: 2.95 MIL/uL — ABNORMAL LOW (ref 4.22–5.81)
RDW: 16.9 % — ABNORMAL HIGH (ref 11.5–15.5)
WBC: 2.9 10*3/uL — ABNORMAL LOW (ref 4.0–10.5)
nRBC: 0 % (ref 0.0–0.2)

## 2022-08-09 LAB — BASIC METABOLIC PANEL
Anion gap: 9 (ref 5–15)
BUN: 25 mg/dL — ABNORMAL HIGH (ref 8–23)
CO2: 28 mmol/L (ref 22–32)
Calcium: 8.4 mg/dL — ABNORMAL LOW (ref 8.9–10.3)
Chloride: 97 mmol/L — ABNORMAL LOW (ref 98–111)
Creatinine, Ser: 0.9 mg/dL (ref 0.61–1.24)
GFR, Estimated: >60 mL/min (ref >60)
Glucose, Bld: 275 mg/dL — ABNORMAL HIGH (ref 70–99)
Potassium: 3.8 mmol/L (ref 3.5–5.1)
Sodium: 134 mmol/L — ABNORMAL LOW (ref 135–145)

## 2022-08-09 LAB — VITAMIN B12: Vitamin B-12: 522 pg/mL (ref 180–914)

## 2022-08-09 LAB — PROCALCITONIN: Procalcitonin: <0.1 ng/mL

## 2022-08-09 LAB — AMMONIA: Ammonia: 14 umol/L (ref 9–35)

## 2022-08-09 MED ORDER — BUSPIRONE HCL 5 MG PO TABS: 7.5000 mg | ORAL_TABLET | Freq: Every evening | ORAL | Status: DC

## 2022-08-09 MED ORDER — INSULIN GLARGINE-YFGN 100 UNIT/ML ~~LOC~~ SOLN
25.0000 [IU] | Freq: Every day | SUBCUTANEOUS | Status: DC
  Administered 2022-08-09 – 2022-08-14 (×6): 25 [IU] via SUBCUTANEOUS
  Filled 2022-08-09 (×6): qty 0.25

## 2022-08-09 MED ORDER — THIAMINE MONONITRATE 100 MG PO TABS
100.0000 mg | ORAL_TABLET | Freq: Every day | ORAL | Status: DC
  Administered 2022-08-09 – 2022-08-14 (×6): 100 mg via ORAL
  Filled 2022-08-09 (×6): qty 1

## 2022-08-09 MED ORDER — BUSPIRONE HCL 5 MG PO TABS
7.5000 mg | ORAL_TABLET | Freq: Two times a day (BID) | ORAL | Status: DC
  Administered 2022-08-09 – 2022-08-14 (×10): 7.5 mg via ORAL
  Filled 2022-08-09 (×10): qty 2

## 2022-08-09 MED ORDER — POTASSIUM CHLORIDE CRYS ER 20 MEQ PO TBCR
20.0000 meq | EXTENDED_RELEASE_TABLET | Freq: Once | ORAL | Status: AC
  Administered 2022-08-09: 20 meq via ORAL
  Filled 2022-08-09: qty 1

## 2022-08-09 MED ORDER — FUROSEMIDE 10 MG/ML IJ SOLN
20.0000 mg | Freq: Once | INTRAMUSCULAR | Status: AC
  Administered 2022-08-09: 20 mg via INTRAVENOUS
  Filled 2022-08-09: qty 2

## 2022-08-09 MED ORDER — BUSPIRONE HCL 5 MG PO TABS: 5.0000 mg | ORAL_TABLET | Freq: Every day | ORAL | Status: DC

## 2022-08-09 NOTE — Plan of Care (Signed)

## 2022-08-09 NOTE — TOC Progression Note (Signed)
Transition of Care [facility]) - Progression Note    Patient Details  Name: Mark Cabrera MRN: [id number] Date of Birth: Jul 05, 1950  [facility] Care [facility]) CM/SW Contact  Howell Rucks, RN Phone Number: 08/09/2022, 1:53 PM  Clinical Narrative:  Call received from [name]), inquiring if any response from any facility for memory care bed, informed none at this time. Mark Cabrera voiced she is concerned about pt being discharged with  anywhere to go or possibly having to dc back to [facility], requesting to speak with [facility] supervisor for possible dc options. NCM outreached to [facility] supervisor, meeting set for Thursday 5/30 at 12Noon, [facility] agreeable.        Barriers to Discharge: No Barriers Identified  Expected Discharge Plan and Services                                               Social Determinants of Health (SDOH) Interventions SDOH Screenings   Food Insecurity: No Food Insecurity (08/02/2022)  Housing: Low Risk  (08/02/2022)  Transportation Needs: No Transportation Needs (08/02/2022)  Utilities: Not At Risk (08/02/2022)  Alcohol Screen: Low Risk  (01/18/2022)  Depression (PHQ2-9): Low Risk  (01/18/2022)  Financial Resource Strain: Medium Risk (01/18/2022)  Physical Activity: Inactive (01/18/2022)  Social Connections: Moderately Isolated (01/18/2022)  Stress: Stress Concern Present (01/18/2022)  Tobacco Use: Low Risk  (08/02/2022)    Readmission Risk Interventions     No data to display

## 2022-08-09 NOTE — Progress Notes (Signed)
Nutrition Follow-up  DOCUMENTATION CODES:   Non-severe (moderate) malnutrition in context of chronic illness  INTERVENTION:  - Heart Healthy/Carb Modified per MD.  - Ensure Plus High Protein po BID, each supplement provides 350 kcal and 20 grams of protein. - Multivitamin with minerals daily - Monitor weight trends.  Nutrition status stable at this time, will sign off. Please re-consult if needed.   NUTRITION DIAGNOSIS:   Moderate Malnutrition related to chronic illness as evidenced by moderate fat depletion, moderate muscle depletion. *ongoing  GOAL:   Patient will meet greater than or equal to 90% of their needs *met  MONITOR:   PO intake, Supplement acceptance, Weight trends  REASON FOR ASSESSMENT:   Malnutrition Screening Tool    ASSESSMENT:   72 year old male with chronic combined systolic and diastolic CHF, dementia who presented with complaints of behavioral disturbances from his nursing home, as well as suspected acute on chronic combined CHF.  Patient sleeping in bed at time of visit, no visitors at bedside. Per chart review, patient consuming 50-100% of meals over the past week. Average of 84%. He also has been drinking around 1-2 Ensures daily.  Weights continue to fluctuate this admission. Patient -8L which is likely affecting weight status. He remains awaiting placement at this time.   Nutrition status stable at this time, will sign off.    Medications reviewed and include: Colace, Insulin, MVI  Labs reviewed:  Na 134 HA1C 8.5 Blood Glucose 146-240 x24 hours   Diet Order:   Diet Order             Diet heart healthy/carb modified Room service appropriate? Yes; Fluid consistency: Thin  Diet effective now                   EDUCATION NEEDS:  Education needs have been addressed  Skin:  Skin Assessment: Reviewed RN Assessment  Last BM:  5/29  Height:  Ht Readings from Last 1 Encounters:  08/02/22 5\' 8"  (1.727 m)   Weight:  Wt  Readings from Last 1 Encounters:  08/09/22 76.4 kg    BMI:  Body mass index is 25.61 kg/m.  Estimated Nutritional Needs:  Kcal:  1800-1950 kcals Protein:  95-115 grams Fluid:  >/= 1.8L    Shelle Iron RD, LDN For contact information, refer to [facility].

## 2022-08-09 NOTE — Progress Notes (Signed)
Occupational Therapy Treatment Patient Details Name: Mark Cabrera MRN: [id number] DOB: [birth year] Today's Date: 08/09/2022   History of present illness [name] is a 72 yr old male brought to the hospital from a nursing facility with behavioral disturbance. He was found to have acute on chronic heart failure. PMH: combined systolic & diastolic heart failure, dementia, OA of the knee, CVA, a fib, CAD, AVR   OT comments  The pt initially required light encouragement to participate in the session, however he became more motivated once he understood therapy could assist him with getting out of the bed and participating in desired functional activities. He was assisted with out of bed transfers, toileting at bathroom level, and grooming seated in the chair. He required 1 seated rest break during the session, due to reports of slight shortness of breath with activity, as well as feelings of BLE weakness after progressive activity. He was assisted to the bedside chair at the end of the session, which the pt expressed delight in. He will continue to benefit from OT services to maximize his safety and independence with self care tasks.    Recommendations for follow up therapy are one component of a multi-disciplinary discharge planning process, led by the attending physician.  Recommendations may be updated based on patient status, additional functional criteria and insurance authorization.    Assistance Recommended at Discharge Intermittent Supervision/Assistance  Patient can return home with the following  A little help with walking and/or transfers;A little help with bathing/dressing/bathroom;Direct supervision/assist for medications management   Equipment Recommendations  None recommended by OT       Precautions / Restrictions Precautions Precautions: Fall Restrictions Weight Bearing Restrictions: No Other Position/Activity Restrictions: contact precautions       Mobility Bed  Mobility Overal bed mobility: Needs Assistance Bed Mobility: Supine to Sit     Supine to sit: Supervision          Transfers Overall transfer level: Needs assistance Equipment used: Rolling walker (2 wheels) Transfers: Sit to/from Stand Sit to Stand: Min assist, From elevated surface                     ADL either performed or assessed with clinical judgement   ADL Overall ADL's : Needs assistance/impaired Eating/Feeding: Independent;Sitting Eating/Feeding Details (indicate cue type and reason): based on clinical judgement Grooming: Set up;Sitting Grooming Details (indicate cue type and reason): He performed face washing seated in the bedside chair.             Lower Body Dressing: Minimal assistance;Sit to/from stand   Toilet Transfer: Minimal assistance;Ambulation;Rolling walker (2 wheels) Toilet Transfer Details (indicate cue type and reason): The pt ambulated to and from the bathroom using a rolling walker. He reported feelings of BLE weakness with progressive activity, therefore requiring min assist to transfer from lower toilet surface. He also required min cues for best walker placement and using the grab bar for support as needed. Toileting- Clothing Manipulation and Hygiene: Minimal assistance Toileting - Clothing Manipulation Details (indicate cue type and reason): He performed posterior peri-hygiene in sitting with SBA. Once in standing, he required steadying assist and light assistance for clothing management.              Cognition Arousal/Alertness: Awake/alert   Overall Cognitive Status: History of cognitive impairments - at baseline Area of Impairment: Safety/judgement, Orientation  Safety/Judgement: Decreased awareness of safety     General Comments: The pt did not recall being in the hospital                   Pertinent Vitals/ Pain       Pain Assessment Pain Assessment: No/denies pain          Frequency  Min 1X/week        Progress Toward Goals  OT Goals(current goals can now be found in the care plan section)  Progress towards OT goals: Progressing toward goals  Acute Rehab OT Goals OT Goal Formulation: With patient Time For Goal Achievement: 08/17/22 Potential to Achieve Goals: Good  Plan Discharge plan remains appropriate       AM-PAC OT "6 Clicks" Daily Activity     Outcome Measure   Help from another person eating meals?: None Help from another person taking care of personal grooming?: None Help from another person toileting, which includes using toliet, bedpan, or urinal?: A Little Help from another person bathing (including washing, rinsing, drying)?: A Little Help from another person to put on and taking off regular upper body clothing?: None Help from another person to put on and taking off regular lower body clothing?: A Little 6 Click Score: 21    End of Session Equipment Utilized During Treatment: Gait belt;Rolling walker (2 wheels)  OT Visit Diagnosis: Unsteadiness on feet (R26.81)   Activity Tolerance Patient tolerated treatment well   Patient Left in chair;with call bell/phone within reach;with chair alarm set   Nurse Communication Mobility status        Time: 1610-9604 OT Time Calculation (min): 25 min  Charges: OT General Charges $OT Visit: 1 Visit OT Treatments $Self Care/Home Management : 8-22 mins $Therapeutic Activity: 8-22 mins     Mark Cabrera, OTR/L 08/09/2022, 5:22 PM

## 2022-08-09 NOTE — Progress Notes (Addendum)
PROGRESS NOTE    THESEUS MALBROUGH  ZOX:[id number] DOB: [birth year] DOA: 08/02/2022 PCP: Evlyn Kanner, MD   Brief Narrative: 72 year old with past medical history significant for combined systolic and diastolic heart failure, dementia presenting to the hospital with complaining of behavioral disturbance from his nursing home, as well as suspected acute on chronic combined CHF.  Patient has been having several ED visits in the last several days due to report of increased behavioral disturbance, but in the ER he has been calm.  Due to multiple sick visits he has missed his scheduled medications for his heart failure.   Assessment & Plan:   Principal Problem:   Acute on chronic combined systolic and diastolic CHF (congestive heart failure) (HCC) Active Problems:   Dyslipidemia   Chronic diastolic heart failure (HCC)   CAD (coronary artery disease), native coronary artery   Paroxysmal atrial fibrillation (HCC)   Aggressive behavior   Dementia with behavioral disturbance (HCC)   Acute systolic (congestive) heart failure (HCC)   Acute on chronic systolic CHF (congestive heart failure) (HCC)  1-Acute on chronic combined CHF: -He had congestion on chest x-ray, BNP was elevated on admission. -Cardiology was consulted and followed patient while hospitalized. -Patient improved with IV Lasix, subsequently transition to oral -Patient will be discharged on lasix 40 mg twice daily. Cardiology recommend to continue with daily dose while in the hospital.  -he is more agitated this afternoon. He report SOB. He is in not distress. Sat 100 RA. Will proceed with chest x ray. Would give IV lasix as needed.   2-Dementia, with  behavioral disturbance. No significant behavioral issues since he has been in the hospital. Aricept has been discontinued by his neurologist. Will continue with  BuSpar (same dose, he has been more agitated today), continue Depakote, Lexapro, Seroquel. Continue with  Ativan for  anxiety Check B 12. Ammonia level , thiamine level  3-PAF: Continue with Eliquis and metoprolol  4-Hypertension: Continue with metoprolol  5-Hypokalemia: Replace  Anemia: Monitor  Hyponatremia in setting of heart failure.  Normalized  Diabetes type 2; hyperglycemia. Uncontrolled. Increase semglee to 25 units . Continue with SSI.  Moderate malnutrition; on supplement.  See wound care documentation below.  Pressure Injury 01/23/22 Coccyx Mid Stage 2 -  Partial thickness loss of dermis presenting as a shallow open injury with a red, pink wound bed without slough. (Active)  01/23/22 1915  Location: Coccyx  Location Orientation: Mid  Staging: Stage 2 -  Partial thickness loss of dermis presenting as a shallow open injury with a red, pink wound bed without slough.  Wound Description (Comments):   Present on Admission:      Nutrition Problem: Moderate Malnutrition Etiology: chronic illness    Signs/Symptoms: moderate fat depletion, moderate muscle depletion    Interventions: Refer to RD note for recommendations, Ensure Enlive (each supplement provides 350kcal and 20 grams of protein), MVI  Estimated body mass index is 25.61 kg/m as calculated from the following:   Height as of this encounter: 5\' 8"  (1.727 m).   Weight as of this encounter: 76.4 kg.   DVT prophylaxis: Eliquis Code Status: DNR Family Communication: Disposition Plan:  Status is: Inpatient Remains inpatient appropriate because: currently awaiting placement.     Consultants:  Cardiology   Procedures:  none  Antimicrobials:    Subjective: He is sleepy, open eyes to voice, answer few questions.  He denies pain, he relate he had BM   Objective: Vitals:   08/08/22 2227 08/09/22 0500 08/09/22 8119  08/09/22 1152  BP: 120/78  120/76 114/71  Pulse: 88   60  Resp: 18  16 14   Temp: (!) 97.5 F (36.4 C)  97.8 F (36.6 C) (!) 97.4 F (36.3 C)  TempSrc: Oral  Oral Oral  SpO2: 98%  91% 100%  Weight:   76.4 kg    Height:        Intake/Output Summary (Last 24 hours) at 08/09/2022 1226 Last data filed at 08/09/2022 1043 Gross per 24 hour  Intake 645 ml  Output 1700 ml  Net -1055 ml   Filed Weights   08/07/22 0500 08/08/22 0500 08/09/22 0500  Weight: 77 kg 78.9 kg 76.4 kg    Examination:  General exam: Appears calm and comfortable  Respiratory system: Clear to auscultation. Respiratory effort normal. Cardiovascular system: S1 & S2 heard, RRR. Gastrointestinal system: Abdomen is nondistended, soft and nontender. No organomegaly or masses felt. Normal bowel sounds heard. Central nervous system: Alert and oriented. No focal neurological deficits. Extremities: Symmetric 5 x 5 power.    Data Reviewed: I have personally reviewed following labs and imaging studies  CBC: Recent Labs  Lab 08/05/22 0347 08/06/22 0351 08/07/22 0523 08/08/22 0344 08/09/22 0406  WBC 2.2* 2.1* 2.4* 2.1* 2.9*  HGB 8.6* 8.4* 10.1* 9.7* 9.7*  HCT 26.2* 25.7* 31.1* 30.0* 29.4*  MCV 100.0 98.8 101.0* 101.4* 99.7  PLT 194 213 223 210 192   Basic Metabolic Panel: Recent Labs  Lab 08/02/22 1338 08/03/22 0346 08/04/22 0953 08/05/22 0347 08/06/22 0351 08/07/22 0523 08/08/22 0344 08/09/22 0406  NA 132*   < > 134* 135 136 137 134* 134*  K 4.1   < > 4.2 3.7 3.4* 3.8 3.8 3.8  CL 99   < > 104 102 102 100 98 97*  CO2 19*   < > 18* 25 25 28 28 28   GLUCOSE 287*   < > 198* 269* 223* 277* 322* 275*  BUN 31*   < > 33* 31* 31* 27* 25* 25*  CREATININE 1.28*   < > 1.12 1.18 0.97 0.89 0.89 0.90  CALCIUM 8.4*   < > 8.3* 8.5* 8.6* 8.8* 8.3* 8.4*  MG 1.5*  --  1.8  --  1.6*  --   --   --    < > = values in this interval not displayed.   GFR: Estimated Creatinine Clearance: 70.7 mL/min (by C-G formula based on SCr of 0.9 mg/dL). Liver Function Tests: No results for input(s): "AST", "ALT", "ALKPHOS", "BILITOT", "PROT", "ALBUMIN" in the last 168 hours. No results for input(s): "LIPASE", "AMYLASE" in the last  168 hours. No results for input(s): "AMMONIA" in the last 168 hours. Coagulation Profile: No results for input(s): "INR", "PROTIME" in the last 168 hours. Cardiac Enzymes: No results for input(s): "CKTOTAL", "CKMB", "CKMBINDEX", "TROPONINI" in the last 168 hours. BNP (last 3 results) No results for input(s): "PROBNP" in the last 8760 hours. HbA1C: No results for input(s): "HGBA1C" in the last 72 hours. CBG: Recent Labs  Lab 08/08/22 1158 08/08/22 1716 08/08/22 2230 08/09/22 0737 08/09/22 1147  GLUCAP 204* 146* 168* 212* 240*   Lipid Profile: No results for input(s): "CHOL", "HDL", "LDLCALC", "TRIG", "CHOLHDL", "LDLDIRECT" in the last 72 hours. Thyroid Function Tests: No results for input(s): "TSH", "T4TOTAL", "FREET4", "T3FREE", "THYROIDAB" in the last 72 hours. Anemia Panel: No results for input(s): "VITAMINB12", "FOLATE", "FERRITIN", "TIBC", "IRON", "RETICCTPCT" in the last 72 hours. Sepsis Labs: No results for input(s): "PROCALCITON", "LATICACIDVEN" in the last 168 hours.  Recent Results (from the past 240 hour(s))  MRSA Next Gen by PCR, Nasal     Status: Abnormal   Collection Time: 08/02/22  4:19 PM   Specimen: Nasal Mucosa; Nasal Swab  Result Value Ref Range Status   MRSA by PCR Next Gen DETECTED (A) NOT DETECTED Final    Comment: (NOTE) The GeneXpert MRSA Assay (FDA approved for NASAL specimens only), is one component of a comprehensive MRSA colonization surveillance program. It is not intended to diagnose MRSA infection nor to guide or monitor treatment for MRSA infections. Test performance is not FDA approved in patients less than 41 years old. Performed at [facility], [address], [place], [place] [address]          Radiology Studies: No results found.      Scheduled Meds:  apixaban  5 mg Oral BID   busPIRone  7.5 mg Oral BID   divalproex  250 mg Oral BID   docusate sodium  100 mg Oral BID   escitalopram  10 mg Oral Daily    feeding supplement  237 mL Oral BID BM   furosemide  40 mg Oral Daily   insulin aspart  0-15 Units Subcutaneous TID WC   insulin aspart  0-5 Units Subcutaneous QHS   insulin aspart  3 Units Subcutaneous TID WC   insulin glargine-yfgn  25 Units Subcutaneous Daily   metoprolol succinate  12.5 mg Oral Daily   multivitamin with minerals  1 tablet Oral Daily   QUEtiapine  75 mg Oral QHS   spironolactone  12.5 mg Oral Daily   valACYclovir  1,000 mg Oral Daily   Continuous Infusions:   LOS: 6 days    Time spent: 35 minutes    Telina Kleckley A Naziyah Tieszen, MD [facility]   If 7PM-7AM, please contact night-coverage [email or web address]  08/09/2022, 12:26 PM

## 2022-08-10 DIAGNOSIS — I5043 Acute on chronic combined systolic (congestive) and diastolic (congestive) heart failure: Secondary | ICD-10-CM | POA: Diagnosis not present

## 2022-08-10 LAB — BASIC METABOLIC PANEL
Anion gap: 8 (ref 5–15)
BUN: 28 mg/dL — ABNORMAL HIGH (ref 8–23)
CO2: 26 mmol/L (ref 22–32)
Calcium: 8.4 mg/dL — ABNORMAL LOW (ref 8.9–10.3)
Chloride: 103 mmol/L (ref 98–111)
Creatinine, Ser: 0.93 mg/dL (ref 0.61–1.24)
GFR, Estimated: >60 mL/min (ref >60)
Glucose, Bld: 194 mg/dL — ABNORMAL HIGH (ref 70–99)
Potassium: 3.8 mmol/L (ref 3.5–5.1)
Sodium: 137 mmol/L (ref 135–145)

## 2022-08-10 LAB — GLUCOSE, CAPILLARY
Glucose-Capillary: 175 mg/dL — ABNORMAL HIGH (ref 70–99)
Glucose-Capillary: 215 mg/dL — ABNORMAL HIGH (ref 70–99)
Glucose-Capillary: 178 mg/dL — ABNORMAL HIGH (ref 70–99)
Glucose-Capillary: 176 mg/dL — ABNORMAL HIGH (ref 70–99)

## 2022-08-10 LAB — BRAIN NATRIURETIC PEPTIDE: B Natriuretic Peptide: 1052.1 pg/mL — ABNORMAL HIGH (ref 0.0–100.0)

## 2022-08-10 LAB — MAGNESIUM: Magnesium: 1.9 mg/dL (ref 1.7–2.4)

## 2022-08-10 MED ORDER — POTASSIUM CHLORIDE CRYS ER 20 MEQ PO TBCR
20.0000 meq | EXTENDED_RELEASE_TABLET | Freq: Once | ORAL | Status: AC
  Administered 2022-08-10: 20 meq via ORAL
  Filled 2022-08-10: qty 1

## 2022-08-10 MED ORDER — FUROSEMIDE 40 MG PO TABS
40.0000 mg | ORAL_TABLET | Freq: Two times a day (BID) | ORAL | Status: DC
  Administered 2022-08-10 – 2022-08-14 (×9): 40 mg via ORAL
  Filled 2022-08-10 (×9): qty 1

## 2022-08-10 NOTE — Plan of Care (Signed)

## 2022-08-10 NOTE — Care Management Important Message (Signed)
Important Message  Patient Details I'm Letter placed in room Name: Mark Cabrera MRN: [id number] Date of Birth: [birth year]   Medicare Important Message Given:  Yes     Caren Macadam 08/10/2022, 11:30 AM

## 2022-08-10 NOTE — Progress Notes (Addendum)
Physical Therapy Treatment Patient Details Name: Mark Cabrera MRN: [id number] DOB: [birth year] Today's Date: 08/10/2022   History of Present Illness Mr. Gohn is a 72 yr old male brought to the hospital from a nursing facility with behavioral disturbance. He was found to have acute on chronic heart failure. PMH: combined systolic & diastolic heart failure, dementia, OA of the knee, CVA, a fib, CAD, AVR    PT Comments    Pt declines getting OOB initially, but agreeable to supine exercises. Tactile cues for increasing ROM and verbal cues to decrease momentum, tolerates exercises without complaints. Pt's lunch tray arrives and pt in agreement to get OOB to recliner, but declines ambulation due to wanting to eat. Pt powers to stand with HHA, min G, able to step to recliner, noted to be soiled in BM. Pt able to perform STS reps from recliner with RW and maintains static standing while NT assists with pericare. Pt pleasant, follows 1 step commands appropriately, needing encouragement and education to improve participation. Pt denies pain and SOB during session.   Recommendations for follow up therapy are one component of a multi-disciplinary discharge planning process, led by the attending physician.  Recommendations may be updated based on patient status, additional functional criteria and insurance authorization.  Follow Up Recommendations  Can patient physically be transported by private vehicle: No    Assistance Recommended at Discharge Frequent or constant Supervision/Assistance  Patient can return home with the following A little help with walking and/or transfers;A little help with bathing/dressing/bathroom;Assistance with cooking/housework;Assist for transportation   Equipment Recommendations  None recommended by PT    Recommendations for Other Services       Precautions / Restrictions Precautions Precautions: Fall Restrictions Weight Bearing Restrictions: No     Mobility  Bed  Mobility Overal bed mobility: Needs Assistance Bed Mobility: Supine to Sit     Supine to sit: Supervision     General bed mobility comments: slightly increased time and effort    Transfers Overall transfer level: Needs assistance Equipment used: 1 person hand held assist, Rolling walker (2 wheels) Transfers: Sit to/from Stand, Bed to chair/wheelchair/BSC Sit to Stand: Min guard   Step pivot transfers: Min guard       General transfer comment: pt declines RW initially, min G to power up to stand and pivot to recliner with HHA; pt noted to be soiled in BM, able to perform 2 STS reps from recliner with BUE assisting with RW, verbal cues for hand placement    Ambulation/Gait               General Gait Details: pt declines ambulation due to lunch tray arriving   Stairs             Wheelchair Mobility    Modified Rankin (Stroke Patients Only)       Balance Overall balance assessment: Needs assistance, History of Falls   Sitting balance-Leahy Scale: Good     Standing balance support: Bilateral upper extremity supported, During functional activity, Single extremity supported Standing balance-Leahy Scale: Poor                              Cognition Arousal/Alertness: Awake/alert Behavior During Therapy: WFL for tasks assessed/performed Overall Cognitive Status: History of cognitive impairments - at baseline  General Comments: pt pleasant, follows 1 step commands consistently        Exercises General Exercises - Lower Extremity Ankle Circles/Pumps: AROM, Both, 10 reps, Supine Heel Slides: AROM, Strengthening, Both, 10 reps, Supine Straight Leg Raises: AROM, Strengthening, Both, 10 reps, Supine    General Comments        Pertinent Vitals/Pain Pain Assessment Pain Assessment: No/denies pain    Home Living                          Prior Function            PT Goals  (current goals can now be found in the care plan section) Progress towards PT goals: Progressing toward goals    Frequency    Min 1X/week      PT Plan Current plan remains appropriate    Co-evaluation              AM-PAC PT "6 Clicks" Mobility   Outcome Measure  Help needed turning from your back to your side while in a flat bed without using bedrails?: A Little Help needed moving from lying on your back to sitting on the side of a flat bed without using bedrails?: A Little Help needed moving to and from a bed to a chair (including a wheelchair)?: A Little Help needed standing up from a chair using your arms (e.g., wheelchair or bedside chair)?: A Little Help needed to walk in hospital room?: A Little Help needed climbing 3-5 steps with a railing? : A Lot 6 Click Score: 17    End of Session Equipment Utilized During Treatment: Gait belt Activity Tolerance: Patient tolerated treatment well Patient left: in chair;with call bell/phone within reach;with chair alarm set;with nursing/sitter in room Nurse Communication: Mobility status PT Visit Diagnosis: Unsteadiness on feet (R26.81);Repeated falls (R29.6);History of falling (Z91.81);Difficulty in walking, not elsewhere classified (R26.2);Pain     Time: 1610-9604 PT Time Calculation (min) (ACUTE ONLY): 19 min  Charges:  $Therapeutic Exercise: 8-22 mins                      Tori Kazimierz Springborn PT, DPT 08/10/22, 11:50 AM

## 2022-08-10 NOTE — Progress Notes (Addendum)
PROGRESS NOTE    Mark Cabrera  ZOX:[id number] DOB: 10-01-1950 DOA: 08/02/2022 PCP: Evlyn Kanner, MD   Brief Narrative: 72 year old with past medical history significant for combined systolic and diastolic heart failure, dementia presenting to the hospital with complaining of behavioral disturbance from his nursing home, as well as suspected acute on chronic combined CHF.  Patient has been having several ED visits in the last several days due to report of increased behavioral disturbance, but in the ER he has been calm.  Due to multiple ED visits he has missed his scheduled medications for his heart failure.   Assessment & Plan:   Principal Problem:   Acute on chronic combined systolic and diastolic CHF (congestive heart failure) (HCC) Active Problems:   Dyslipidemia   Chronic diastolic heart failure (HCC)   CAD (coronary artery disease), native coronary artery   Paroxysmal atrial fibrillation (HCC)   Aggressive behavior   Dementia with behavioral disturbance (HCC)   Acute systolic (congestive) heart failure (HCC)   Acute on chronic systolic CHF (congestive heart failure) (HCC)  1-Acute on chronic combined CHF: -He had congestion on chest x-ray, BNP was elevated on admission. -Cardiology was consulted and followed patient while hospitalized. -Patient improved with IV Lasix, subsequently transition to oral -Patient will be discharged on lasix 40 mg twice daily.  -He was more agitated the  afternoon 5/29. He report SOB. He is in not distress. Sat 100 RA. Chest x ray with small left pleural effusion, patchy opacity retrocardiac region. Received extra dos of IV lasix 5/29. He is feeling better today, denies dyspnea, BNP trending down to 1052. Will change lasix to 40 mg BID>  Negative 9.6 L.   2-Dementia, with  behavioral disturbance. No significant behavioral issues since he has been in the hospital. Aricept has been discontinued by his neurologist. Continue Depakote, Lexapro,  Seroquel, Buspar Continue with  Ativan for anxiety PRN.   B 12: 522. Ammonia level normal, thiamine level pending.  Started on thiamine.   3-PAF: Continue with Eliquis and metoprolol  4-Hypertension: Continue with metoprolol  5-Hypokalemia: Replace  Anemia: Monitor  Hyponatremia in setting of heart failure.  Normalized Chronic Leukopenia. Stable. Monitor . B 12 normal. Has history of cirrhosis.  Diabetes type 2; hyperglycemia. Uncontrolled. Increase semglee to 25 units . Continue with SSI.  Moderate malnutrition; on supplement.  See wound care documentation below.  Pressure Injury 01/23/22 Coccyx Mid Stage 2 -  Partial thickness loss of dermis presenting as a shallow open injury with a red, pink wound bed without slough. (Active)  01/23/22 1915  Location: Coccyx  Location Orientation: Mid  Staging: Stage 2 -  Partial thickness loss of dermis presenting as a shallow open injury with a red, pink wound bed without slough.  Wound Description (Comments):   Present on Admission:      Nutrition Problem: Moderate Malnutrition Etiology: chronic illness    Signs/Symptoms: moderate fat depletion, moderate muscle depletion    Interventions: Refer to RD note for recommendations, Ensure Enlive (each supplement provides 350kcal and 20 grams of protein), MVI  Estimated body mass index is 26.72 kg/m as calculated from the following:   Height as of this encounter: 5\' 8"  (1.727 m).   Weight as of this encounter: 79.7 kg.   DVT prophylaxis: Eliquis Code Status: DNR Family Communication: Daughter over phone 5/29 Disposition Plan:  Status is: Inpatient Remains inpatient appropriate because: currently awaiting placement.     Consultants:  Cardiology   Procedures:  none  Antimicrobials:  Subjective: He is feeling better today. He is calm. Answer questions. He is breathing better today   Objective: Vitals:   08/10/22 0448 08/10/22 0456 08/10/22 0839 08/10/22 1100  BP:   103/71 115/72 112/78  Pulse:  86 83 67  Resp:  20    Temp:  97.7 F (36.5 C)    TempSrc:  Oral    SpO2:  99%    Weight: 79.7 kg     Height:        Intake/Output Summary (Last 24 hours) at 08/10/2022 1139 Last data filed at 08/10/2022 0448 Gross per 24 hour  Intake --  Output 1850 ml  Net -1850 ml    Filed Weights   08/08/22 0500 08/09/22 0500 08/10/22 0448  Weight: 78.9 kg 76.4 kg 79.7 kg    Examination:  General exam: NAD Respiratory system: CTA Cardiovascular system: SS 1, S 2 RRR Gastrointestinal system: BS present, soft, nt Central nervous system: alert,  Extremities: no edema    Data Reviewed: I have personally reviewed following labs and imaging studies  CBC: Recent Labs  Lab 08/05/22 0347 08/06/22 0351 08/07/22 0523 08/08/22 0344 08/09/22 0406  WBC 2.2* 2.1* 2.4* 2.1* 2.9*  HGB 8.6* 8.4* 10.1* 9.7* 9.7*  HCT 26.2* 25.7* 31.1* 30.0* 29.4*  MCV 100.0 98.8 101.0* 101.4* 99.7  PLT 194 213 223 210 192    Basic Metabolic Panel: Recent Labs  Lab 08/04/22 0953 08/05/22 0347 08/06/22 0351 08/07/22 0523 08/08/22 0344 08/09/22 0406 08/10/22 0418 08/10/22 1030  NA 134*   < > 136 137 134* 134* 137  --   K 4.2   < > 3.4* 3.8 3.8 3.8 3.8  --   CL 104   < > 102 100 98 97* 103  --   CO2 18*   < > 25 28 28 28 26   --   GLUCOSE 198*   < > 223* 277* 322* 275* 194*  --   BUN 33*   < > 31* 27* 25* 25* 28*  --   CREATININE 1.12   < > 0.97 0.89 0.89 0.90 0.93  --   CALCIUM 8.3*   < > 8.6* 8.8* 8.3* 8.4* 8.4*  --   MG 1.8  --  1.6*  --   --   --   --  1.9   < > = values in this interval not displayed.    GFR: Estimated Creatinine Clearance: 68.4 mL/min (by C-G formula based on SCr of 0.93 mg/dL). Liver Function Tests: No results for input(s): "AST", "ALT", "ALKPHOS", "BILITOT", "PROT", "ALBUMIN" in the last 168 hours. No results for input(s): "LIPASE", "AMYLASE" in the last 168 hours. Recent Labs  Lab 08/09/22 1307  AMMONIA 14   Coagulation Profile: No  results for input(s): "INR", "PROTIME" in the last 168 hours. Cardiac Enzymes: No results for input(s): "CKTOTAL", "CKMB", "CKMBINDEX", "TROPONINI" in the last 168 hours. BNP (last 3 results) No results for input(s): "PROBNP" in the last 8760 hours. HbA1C: No results for input(s): "HGBA1C" in the last 72 hours. CBG: Recent Labs  Lab 08/09/22 0737 08/09/22 1147 08/09/22 1637 08/09/22 2140 08/10/22 0728  GLUCAP 212* 240* 157* 132* 175*    Lipid Profile: No results for input(s): "CHOL", "HDL", "LDLCALC", "TRIG", "CHOLHDL", "LDLDIRECT" in the last 72 hours. Thyroid Function Tests: No results for input(s): "TSH", "T4TOTAL", "FREET4", "T3FREE", "THYROIDAB" in the last 72 hours. Anemia Panel: Recent Labs    08/09/22 1307  VITAMINB12 522   Sepsis Labs: Recent Labs  Lab 08/09/22 1307  PROCALCITON <0.10    Recent Results (from the past 240 hour(s))  MRSA Next Gen by PCR, Nasal     Status: Abnormal   Collection Time: 08/02/22  4:19 PM   Specimen: Nasal Mucosa; Nasal Swab  Result Value Ref Range Status   MRSA by PCR Next Gen DETECTED (A) NOT DETECTED Final    Comment: (NOTE) The GeneXpert MRSA Assay (FDA approved for NASAL specimens only), is one component of a comprehensive MRSA colonization surveillance program. It is not intended to diagnose MRSA infection nor to guide or monitor treatment for MRSA infections. Test performance is not FDA approved in patients less than 53 years old. Performed at [facility], [address], [place], [place] [address]          Radiology Studies: DG CHEST PORT 1 VIEW  Result Date: 08/09/2022 CLINICAL DATA:  Shortness of breath EXAM: PORTABLE CHEST 1 VIEW COMPARISON:  Chest x-ray 08/02/2022 FINDINGS: Heart is enlarged. Patient is status post valve replacement. There is a small left pleural effusion. There some hazy and patchy opacities in the retrocardiac region similar to prior. Right lung is clear. No pneumothorax  or acute fracture. IMPRESSION: 1. Cardiomegaly. 2. Small left pleural effusion. 3. Hazy and patchy opacities in the retrocardiac region similar to prior. Electronically Signed   By: Darliss Cheney M.D.   On: 08/09/2022 14:35        Scheduled Meds:  apixaban  5 mg Oral BID   busPIRone  7.5 mg Oral BID   divalproex  250 mg Oral BID   docusate sodium  100 mg Oral BID   escitalopram  10 mg Oral Daily   feeding supplement  237 mL Oral BID BM   furosemide  40 mg Oral BID   insulin aspart  0-15 Units Subcutaneous TID WC   insulin aspart  0-5 Units Subcutaneous QHS   insulin aspart  3 Units Subcutaneous TID WC   insulin glargine-yfgn  25 Units Subcutaneous Daily   metoprolol succinate  12.5 mg Oral Daily   multivitamin with minerals  1 tablet Oral Daily   QUEtiapine  75 mg Oral QHS   spironolactone  12.5 mg Oral Daily   thiamine  100 mg Oral Daily   valACYclovir  1,000 mg Oral Daily   Continuous Infusions:   LOS: 7 days    Time spent: 35 minutes    Yareli Carthen A Christiona Siddique, MD [facility]   If 7PM-7AM, please contact night-coverage [email or web address]  08/10/2022, 11:39 AM

## 2022-08-10 NOTE — TOC Progression Note (Signed)
Transition of Care [facility]) - Progression Note    Patient Details  Name: Mark Cabrera MRN: [id number] Date of Birth: 04/05/1950  Transition of Care [facility]) CM/SW Contact  Howell Rucks, RN Phone Number: 08/10/2022, 1:26 PM  Clinical Narrative:  NCM and TOC supervisor met with [name]) in a conference room on hospital unit to discuss dc planning options. Mark Cabrera confirmed she would prefer pt not to return to [facility], reports she has been inquiring and reaching out to local facilities that provide memory care beds with no call back at this time. NCM/TOC Supervisor has reached out to the local facilities that have memory care beds and accept Medicaid ( [facility], [address], [place] and [place]), with no call back at this time. Mark Cabrera requesting fax out to facilities for short term rehab with transition to LTC.TOC supervisor agreeable to plan. NCM will update FL2 and fax out for SNF bed offers. TOC will continue to follow.       Barriers to Discharge: No Barriers Identified  Expected Discharge Plan and Services                                               Social Determinants of Health (SDOH) Interventions SDOH Screenings   Food Insecurity: No Food Insecurity (08/02/2022)  Housing: Low Risk  (08/02/2022)  Transportation Needs: No Transportation Needs (08/02/2022)  Utilities: Not At Risk (08/02/2022)  Alcohol Screen: Low Risk  (01/18/2022)  Depression (PHQ2-9): Low Risk  (01/18/2022)  Financial Resource Strain: Medium Risk (01/18/2022)  Physical Activity: Inactive (01/18/2022)  Social Connections: Moderately Isolated (01/18/2022)  Stress: Stress Concern Present (01/18/2022)  Tobacco Use: Low Risk  (08/02/2022)    Readmission Risk Interventions     No data to display

## 2022-08-10 NOTE — Plan of Care (Signed)
  Problem: Clinical Measurements: Goal: Ability to maintain clinical measurements within normal limits will improve Outcome: Progressing Goal: Diagnostic test results will improve Outcome: Progressing Goal: Respiratory complications will improve Outcome: Progressing Goal: Cardiovascular complication will be avoided Outcome: Progressing   Problem: Coping: Goal: Level of anxiety will decrease Outcome: Progressing   Problem: Safety: Goal: Ability to remain free from injury will improve Outcome: Progressing

## 2022-08-10 NOTE — NC FL2 (Signed)
[place] MEDICAID FL2 LEVEL OF CARE FORM     IDENTIFICATION  Patient Name: Mark Cabrera Birthdate: [birth year] Sex: male Admission Date (Current Location): 08/02/2022  [place] and [place] Number:  [name] [id number] N Facility and Address:  [facility],  [address], [place] [address]      Provider Number: [id number]  Attending Physician Name and Address:  Alba Cory, MD  Relative Name and Phone Number:  Fredis Fayson (dtr, legal guardian) [phone]    Current Level of Care: Hospital Recommended Level of Care: Skilled Nursing Facility Prior Approval Number:    Date Approved/Denied:   PASRR Number: [id number]  Discharge Plan: SNF    Current Diagnoses: Patient Active Problem List   Diagnosis Date Noted   Acute on chronic systolic CHF (congestive heart failure) (HCC) 08/03/2022   Acute on chronic combined systolic and diastolic CHF (congestive heart failure) (HCC) 08/02/2022   Dementia with behavioral disturbance (HCC) 08/02/2022   Acute systolic (congestive) heart failure (HCC) 08/02/2022   Palliative care encounter 04/08/2022   Hypomagnesemia 04/08/2022   Aggressive behavior 03/18/2022   Acute exacerbation of CHF (congestive heart failure) (HCC) 02/28/2022   Acute venous stasis dermatitis of both lower extremities 02/27/2022   Acute on chronic diastolic heart failure with preserved ejection fraction (HCC) 02/26/2022   Pressure injury of skin 01/24/2022   Hyperglycemia 01/17/2022   Neutropenia (HCC)    Fever, unspecified    Malnutrition of moderate degree 09/23/2021   Physical deconditioning 09/21/2021   Otitis externa 12/31/2020   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   Balanitis 11/19/2020   Complicated urinary tract infection    Acute kidney injury (HCC)    Thrombocytopenia (HCC)    Hyponatremia 11/08/2020   Left shoulder pain 10/20/2020   Dyspnea 09/23/2020   Cognitive impairment with history of delirium 08/25/2020   Hip pain, chronic,  left 07/05/2020   Epididymitis 04/14/2020   Fatigue 03/18/2020   Hypotension 03/10/2020   Abdominal wall bulge 12/03/2019   Dermoid inclusion cyst 08/07/2019   Chronic left shoulder pain 05/13/2019   Depression 11/05/2018   Blurry vision, bilateral 09/20/2018   CVA (cerebral vascular accident) (HCC) 06/28/2018   Hepatic cirrhosis (HCC) 06/29/2017   Healthcare maintenance 12/16/2016   Lipoma of left upper extremity 09/12/2016   Chronic knee pain 07/04/2016   Decreased visual acuity 07/04/2016   Pleural effusion, left 04/28/2016   Paroxysmal atrial fibrillation (HCC) 04/16/2016   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/31/2016   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   CAD (coronary artery disease), native coronary artery    Chronic diastolic heart failure (HCC)    Sleep apnea 08/02/2009   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   Dyslipidemia 05/18/2006   Hypertensive heart disease     Orientation RESPIRATION BLADDER Height & Weight     Self  Normal External catheter, Incontinent Weight: 79.7 kg Height:  5\' 8"  (172.7 cm)  BEHAVIORAL SYMPTOMS/MOOD NEUROLOGICAL BOWEL NUTRITION STATUS  Other (Comment) (Agitation)   Incontinent Diet (heart healthy)  AMBULATORY STATUS COMMUNICATION OF NEEDS Skin   Extensive Assist Verbally Other (Comment) (Abrasion on bilateral arms and bilateral legs-foam dressing; Erythema/Echymosis sacrum/bilateral buttocks)                       Personal Care Assistance Level of Assistance  Bathing, Feeding, Dressing Bathing Assistance: Limited assistance Feeding assistance: Limited assistance Dressing Assistance: Limited assistance     Functional Limitations Info  Sight, Hearing, Speech Sight  Info: Adequate Hearing Info: Adequate Speech Info: Adequate    SPECIAL CARE FACTORS FREQUENCY  PT (By licensed PT), OT (By licensed OT)     PT Frequency: 5x/wk OT Frequency: 5x/wk            Contractures Contractures Info:  Not present    Additional Factors Info  Code Status, Allergies, Psychotropic Code Status Info: DNR Allergies Info: Morpholine Salicylate, Penicillins, Morphine And Codeine, Sglt2 Inhibitors, Statins Psychotropic Info: Buspar 7.5mg  po BID, Depakote DR 250mg  po BID, Lexapro 10ng po daily, Ativan 0.5mg  po prn, Seroquel 75mg  po daily, Desyrel 25mg  po prn         Current Medications (08/10/2022):  This is the current hospital active medication list Current Facility-Administered Medications  Medication Dose Route Frequency Provider Last Rate Last Admin   acetaminophen (TYLENOL) tablet 650 mg  650 mg Oral Q6H PRN Kirby Crigler, Mir M, MD   650 mg at 08/05/22 2138   Or   acetaminophen (TYLENOL) suppository 650 mg  650 mg Rectal Q6H PRN Kirby Crigler, Mir M, MD       albuterol (PROVENTIL) (2.5 MG/3ML) 0.083% nebulizer solution 2.5 mg  2.5 mg Nebulization Q2H PRN Kirby Crigler, Mir M, MD       apixaban Everlene Balls) tablet 5 mg  5 mg Oral BID Gloris Manchester, MD   5 mg at 08/10/22 1100   busPIRone (BUSPAR) tablet 7.5 mg  7.5 mg Oral BID Regalado, Belkys A, MD   7.5 mg at 08/10/22 1100   divalproex (DEPAKOTE) DR tablet 250 mg  250 mg Oral BID Oneta Rack, NP   250 mg at 08/10/22 1100   docusate sodium (COLACE) capsule 100 mg  100 mg Oral BID Kirby Crigler, Mir M, MD   100 mg at 08/08/22 2302   escitalopram (LEXAPRO) tablet 10 mg  10 mg Oral Daily Gloris Manchester, MD   10 mg at 08/10/22 1100   feeding supplement (ENSURE ENLIVE / ENSURE PLUS) liquid 237 mL  237 mL Oral BID BM Leatha Gilding, MD   237 mL at 08/10/22 1100   furosemide (LASIX) tablet 40 mg  40 mg Oral BID Regalado, Belkys A, MD   40 mg at 08/10/22 0839   insulin aspart (novoLOG) injection 0-15 Units  0-15 Units Subcutaneous TID WC Kirby Crigler, Mir M, MD   5 Units at 08/10/22 1203   insulin aspart (novoLOG) injection 0-5 Units  0-5 Units Subcutaneous QHS Kirby Crigler, Mir M, MD       insulin aspart (novoLOG) injection 3 Units  3 Units Subcutaneous TID WC  Leatha Gilding, MD   3 Units at 08/10/22 1204   insulin glargine-yfgn (SEMGLEE) injection 25 Units  25 Units Subcutaneous Daily Regalado, Belkys A, MD   25 Units at 08/10/22 1100   ipratropium-albuterol (DUONEB) 0.5-2.5 (3) MG/3ML nebulizer solution 3 mL  3 mL Inhalation Q6H PRN Gloris Manchester, MD       LORazepam (ATIVAN) tablet 0.5 mg  0.5 mg Oral Q6H PRN Leatha Gilding, MD   0.5 mg at 08/09/22 2244   metoprolol succinate (TOPROL-XL) 24 hr tablet 12.5 mg  12.5 mg Oral Daily Gloris Manchester, MD   12.5 mg at 08/10/22 1100   multivitamin with minerals tablet 1 tablet  1 tablet Oral Daily Leatha Gilding, MD   1 tablet at 08/10/22 1100   ondansetron (ZOFRAN) tablet 4 mg  4 mg Oral Q6H PRN Maryln Gottron, MD       Or   ondansetron [facility]) injection 4  mg  4 mg Intravenous Q6H PRN Kirby Crigler, Mir M, MD       polyethylene glycol (MIRALAX / GLYCOLAX) packet 17 g  17 g Oral Daily PRN Kirby Crigler, Mir M, MD       QUEtiapine (SEROQUEL) tablet 75 mg  75 mg Oral QHS Oneta Rack, NP   75 mg at 08/09/22 2244   spironolactone (ALDACTONE) tablet 12.5 mg  12.5 mg Oral Daily Gloris Manchester, MD   12.5 mg at 08/10/22 1100   thiamine (VITAMIN B1) tablet 100 mg  100 mg Oral Daily Regalado, Belkys A, MD   100 mg at 08/10/22 1100   traZODone (DESYREL) tablet 25 mg  25 mg Oral QHS PRN Kirby Crigler, Mir M, MD   25 mg at 08/08/22 2305   valACYclovir (VALTREX) tablet 1,000 mg  1,000 mg Oral Daily Leatha Gilding, MD   1,000 mg at 08/10/22 1100     Discharge Medications: Please see discharge summary for a list of discharge medications.  Relevant Imaging Results:  Relevant Lab Results:   Additional Information [id number]  Howell Rucks, RN

## 2022-08-11 DIAGNOSIS — I5043 Acute on chronic combined systolic (congestive) and diastolic (congestive) heart failure: Secondary | ICD-10-CM | POA: Diagnosis not present

## 2022-08-11 LAB — BASIC METABOLIC PANEL
Anion gap: 8 (ref 5–15)
BUN: 33 mg/dL — ABNORMAL HIGH (ref 8–23)
CO2: 26 mmol/L (ref 22–32)
Calcium: 8.4 mg/dL — ABNORMAL LOW (ref 8.9–10.3)
Chloride: 102 mmol/L (ref 98–111)
Creatinine, Ser: 0.87 mg/dL (ref 0.61–1.24)
GFR, Estimated: >60 mL/min (ref >60)
Glucose, Bld: 274 mg/dL — ABNORMAL HIGH (ref 70–99)
Potassium: 3.8 mmol/L (ref 3.5–5.1)
Sodium: 136 mmol/L (ref 135–145)

## 2022-08-11 LAB — MAGNESIUM: Magnesium: 1.7 mg/dL (ref 1.7–2.4)

## 2022-08-11 LAB — GLUCOSE, CAPILLARY
Glucose-Capillary: 221 mg/dL — ABNORMAL HIGH (ref 70–99)
Glucose-Capillary: 256 mg/dL — ABNORMAL HIGH (ref 70–99)
Glucose-Capillary: 169 mg/dL — ABNORMAL HIGH (ref 70–99)
Glucose-Capillary: 102 mg/dL — ABNORMAL HIGH (ref 70–99)

## 2022-08-11 LAB — FOLATE: Folate: 9.7 ng/mL (ref >5.9)

## 2022-08-11 MED ORDER — TRAZODONE HCL 50 MG PO TABS: 25.0000 mg | ORAL_TABLET | Freq: Every evening | ORAL | 0 refills | Status: DC | PRN

## 2022-08-11 MED ORDER — BUSPIRONE HCL 7.5 MG PO TABS: 7.5000 mg | ORAL_TABLET | Freq: Two times a day (BID) | ORAL | 0 refills | Status: DC

## 2022-08-11 MED ORDER — POTASSIUM CHLORIDE CRYS ER 10 MEQ PO TBCR
10.0000 meq | EXTENDED_RELEASE_TABLET | Freq: Every day | ORAL | Status: DC
  Administered 2022-08-11 – 2022-08-14 (×4): 10 meq via ORAL
  Filled 2022-08-11 (×4): qty 1

## 2022-08-11 MED ORDER — DIVALPROEX SODIUM 250 MG PO DR TAB: 250.0000 mg | DELAYED_RELEASE_TABLET | Freq: Two times a day (BID) | ORAL | 0 refills | Status: DC

## 2022-08-11 MED ORDER — ESCITALOPRAM OXALATE 10 MG PO TABS: 10.0000 mg | ORAL_TABLET | Freq: Every day | ORAL | 0 refills | Status: DC

## 2022-08-11 MED ORDER — FUROSEMIDE 40 MG PO TABS: 40.0000 mg | ORAL_TABLET | Freq: Two times a day (BID) | ORAL | 0 refills | Status: DC

## 2022-08-11 MED ORDER — MAGNESIUM SULFATE 2 GM/50ML IV SOLN
2.0000 g | Freq: Once | INTRAVENOUS | Status: AC
  Administered 2022-08-11: 2 g via INTRAVENOUS
  Filled 2022-08-11: qty 50

## 2022-08-11 MED ORDER — POTASSIUM CHLORIDE CRYS ER 10 MEQ PO TBCR: 10.0000 meq | EXTENDED_RELEASE_TABLET | Freq: Every day | ORAL | 0 refills | Status: DC

## 2022-08-11 MED ORDER — VITAMIN B-1 100 MG PO TABS: 100.0000 mg | ORAL_TABLET | Freq: Every day | ORAL | 0 refills | Status: DC

## 2022-08-11 MED ORDER — LORAZEPAM 0.5 MG PO TABS: 0.5000 mg | ORAL_TABLET | Freq: Four times a day (QID) | ORAL | 0 refills | Status: DC | PRN

## 2022-08-11 MED ORDER — QUETIAPINE FUMARATE 25 MG PO TABS: 75.0000 mg | ORAL_TABLET | Freq: Every day | ORAL | 0 refills | Status: DC

## 2022-08-11 NOTE — Consult Note (Signed)
   [facility] Inpatient Consult   08/11/2022  Mark Cabrera 29-May-1950 [id number]  [facility]) Accountable Care Organization (ACO) [facility] Liaison Note  Location: [facility] Liaison screened the patient remotely at [facility]  Insurance: Medicare ACO REACH   Mark Cabrera is a 72 y.o. male who is a Primary Care Patient of Evlyn Kanner, MD. The patient was screened for noted extreme high risk score for unplanned readmission risk with LLOS of 9 days .  The patient was assessed for length of stay potential [facility]) Care Management service needs for post hospital transition for care coordination. Review of patient's electronic medical record reveals patient is for a skilled nursing facility level of care with a need for memory care with possible long term care needs. Patient was from LTC with [facility] noted in review, daughter Otho Perl as legal guardian] exploring other options.  Plan: [facility] Liaison will continue to follow progress and for disposition. Referral request for community care coordination:  Patient is being worked on for a skilled nursing facility level of care noted per inpatient [facility] team notes. If patient goes to a [facility] affiliated facility for short term rehab SNF then can alert [facility] RN of transition for potential follow up.     [facility] Care Management/Population Health does not replace or interfere with any arrangements made by the Inpatient Transition of Care team.   For questions contact:   Charlesetta Shanks, RN BSN CCM [facility]  [phone] business mobile phone Toll free office [phone]  *Concierge Line  [phone] Fax number: [phone] [email or web address]

## 2022-08-11 NOTE — Discharge Summary (Signed)
Physician Discharge Summary   Patient: Mark Cabrera MRN: [id number] DOB: 06/24/1950  Admit date:     08/02/2022  Discharge date: 08/11/22  Discharge Physician: Alba Cory   PCP: Evlyn Kanner, MD   Recommendations at discharge:    Needs Bmet to follow renal function and potassium.  Monitor weight, adjust lasix as needed.   Discharge Diagnoses: Principal Problem:   Acute on chronic combined systolic and diastolic CHF (congestive heart failure) (HCC) Active Problems:   Dyslipidemia   Chronic diastolic heart failure (HCC)   CAD (coronary artery disease), native coronary artery   Paroxysmal atrial fibrillation (HCC)   Aggressive behavior   Dementia with behavioral disturbance (HCC)   Acute systolic (congestive) heart failure (HCC)   Acute on chronic systolic CHF (congestive heart failure) (HCC)  Resolved Problems:   * No resolved hospital problems. *  Hospital Course: 72 year old with past medical history significant for combined systolic and diastolic heart failure, dementia presenting to the hospital with complaining of behavioral disturbance from his nursing home, as well as suspected acute on chronic combined CHF.  Patient has been having several ED visits in the last several days due to report of increased behavioral disturbance, but in the ER he has been calm.  Due to multiple ED visits he has missed his scheduled medications for his heart failure.    Assessment and Plan: 1-Acute on chronic combined CHF: -He had congestion on chest x-ray, BNP was elevated on admission. -Cardiology was consulted and followed patient while hospitalized. -Patient improved with IV Lasix, subsequently transition to oral -Patient will be discharged on lasix 40 mg twice daily.  -He was more agitated the  afternoon 5/29. He report SOB. He is in not distress. Sat 100 RA. Chest x ray with small left pleural effusion, patchy opacity retrocardiac region. Received extra dos of IV lasix  5/29. BNP trending down to 1052. Continue with change lasix to 40 mg BID>  Negative 10 L.  He denies Dyspnea, stable on 2 L oxygen. Weight down to 172---from 187 on admission.   2-Dementia, with  behavioral disturbance. No significant behavioral issues since he has been in the hospital. Aricept has been discontinued by his neurologist. Continue Depakote, Lexapro, Seroquel, Buspar Continue with  Ativan for anxiety PRN.  B 12: 522. Ammonia level normal, thiamine level pending.  Started on thiamine.  He has been calm. Ativan help with agitation. Will discharge with PRN ativan.  Seroquel and Depakote dose was increased./   3-PAF: Continue with Eliquis and metoprolol   4-Hypertension: Continue with metoprolol   5-Hypokalemia: Replaced   Anemia: Monitor   Hyponatremia in setting of heart failure.  Normalized Chronic Leukopenia. Stable. Monitor . B 12 normal. Has history of cirrhosis.  Diabetes type 2; hyperglycemia. Uncontrolled. On  semglee to 25 units . Continue with SSI. Resume home regimen at discharge.  Moderate malnutrition; on supplement.  See wound care documentation below.  Pressure Injury 01/23/22 Coccyx Mid Stage 2 -  Partial thickness loss of dermis presenting as a shallow open injury with a red, pink wound bed without slough. (Active)  01/23/22 1915  Location: Coccyx  Location Orientation: Mid  Staging: Stage 2 -  Partial thickness loss of dermis presenting as a shallow open injury with a red, pink wound bed without slough.  Wound Description (Comments):   Present on Admission:         Nutrition Problem: Moderate Malnutrition Etiology: chronic illness  Consultants: Cardiology  Procedures performed: none Disposition: Skilled nursing facility Diet recommendation:  Discharge Diet Orders (From admission, onward)     Start     Ordered   08/11/22 0000  Diet - low sodium heart healthy        08/11/22 1142           Cardiac diet DISCHARGE  MEDICATION: Allergies as of 08/11/2022       Reactions   Morpholine Salicylate Other (See Comments)   Hallucinations   Penicillins Other (See Comments)   Allergic- reaction?? Has patient had a PCN reaction causing immediate rash, facial/tongue/throat swelling, SOB or lightheadedness with hypotension: Unk Has patient had a PCN reaction causing severe rash involving mucus membranes or skin necrosis: Unk Has patient had a PCN reaction that required hospitalization: Unk Has patient had a PCN reaction occurring within the last 10 years: No If all of the above answers are "NO", then may proceed with Cephalosporin use.   Morphine And Codeine Other (See Comments)   Hallucinations   Sglt2 Inhibitors Rash, Other (See Comments)   Candidiasis infection prone   Statins Rash, Other (See Comments)   Severe rash and back pain, and muscle pain        Medication List     STOP taking these medications    donepezil 5 MG tablet Commonly known as: ARICEPT   levofloxacin 500 MG tablet Commonly known as: LEVAQUIN   Praluent 75 MG/ML Soaj Generic drug: Alirocumab       TAKE these medications    Accu-Chek Guide test strip Generic drug: glucose blood Use as instructed   Accu-Chek Softclix Lancets lancets Use as instructed   acetaminophen 325 MG tablet Commonly known as: TYLENOL Take 2 tablets (650 mg total) by mouth every 4 (four) hours as needed for headache or mild pain.   busPIRone 7.5 MG tablet Commonly known as: BUSPAR Take 1 tablet (7.5 mg total) by mouth 2 (two) times daily.   clotrimazole 1 % cream Commonly known as: LOTRIMIN Apply topically.   cyanocobalamin 100 MCG tablet Commonly known as: VITAMIN B12 Take 100 mcg by mouth daily.   divalproex 250 MG DR tablet Commonly known as: DEPAKOTE Take 1 tablet (250 mg total) by mouth 2 (two) times daily. What changed:  medication strength how much to take   Eliquis 5 MG Tabs tablet Generic drug: apixaban Take 1 tablet  by mouth twice daily What changed: how much to take   escitalopram 10 MG tablet Commonly known as: LEXAPRO Take 1 tablet (10 mg total) by mouth daily.   fluconazole 150 MG tablet Commonly known as: DIFLUCAN Take 1 tablet (150 mg total) by mouth every three (3) days as needed (for scrotal fungal infection). What changed:  when to take this reasons to take this   furosemide 40 MG tablet Commonly known as: LASIX Take 1 tablet (40 mg total) by mouth 2 (two) times daily.   insulin glargine-yfgn 100 UNIT/ML injection Commonly known as: SEMGLEE Inject 0.26 mLs (26 Units total) into the skin at bedtime. What changed: how much to take   Insulin Pen Needle 31G X 5 MM Misc Use one pen needle three times daily. Dx E11.49   ipratropium-albuterol 0.5-2.5 (3) MG/3ML Soln Commonly known as: DUONEB Inhale 3 mLs into the lungs every 6 (six) hours as needed (for shortness of breath).   LORazepam 0.5 MG tablet Commonly known as: ATIVAN Take 1 tablet (0.5 mg total) by mouth every 6 (six) hours as needed for  anxiety.   metFORMIN 500 MG tablet Commonly known as: Glucophage Take 1 tablet (500 mg total) by mouth 2 (two) times daily with a meal.   metoprolol succinate 25 MG 24 hr tablet Commonly known as: TOPROL-XL Take 0.5 tablets (12.5 mg total) by mouth daily.   NovoLOG FlexPen 100 UNIT/ML FlexPen Generic drug: insulin aspart INJECT 5 UNITS UNDER THE SKIN THREE TIMES DAILY WITH MEALS What changed:  how much to take how to take this when to take this additional instructions   polyethylene glycol 17 g packet Commonly known as: MIRALAX / GLYCOLAX Take 17 g by mouth daily.   potassium chloride 10 MEQ tablet Commonly known as: KLOR-CON M Take 1 tablet (10 mEq total) by mouth daily. What changed:  medication strength how much to take how to take this when to take this additional instructions   QUEtiapine 25 MG tablet Commonly known as: SEROquel Take 3 tablets (75 mg total) by  mouth at bedtime. What changed:  medication strength how much to take   Repatha 140 MG/ML Sosy Generic drug: Evolocumab Inject 140 mg into the skin. Every 14 days   spironolactone 25 MG tablet Commonly known as: ALDACTONE Take 0.5 tablets (12.5 mg total) by mouth daily.   thiamine 100 MG tablet Commonly known as: Vitamin B-1 Take 1 tablet (100 mg total) by mouth daily. Start taking on: August 12, 2022   traZODone 50 MG tablet Commonly known as: DESYREL Take 0.5 tablets (25 mg total) by mouth at bedtime as needed for sleep.   valACYclovir 1000 MG tablet Commonly known as: VALTREX Take 1 tablet (1,000 mg total) by mouth 2 (two) times daily as needed (if having herpes outbreak). What changed:  when to take this reasons to take this        Follow-up Information     Dyann Kief, PA-C Follow up.   Specialty: Cardiology Why: [facility] - [address] location - cardiology follow-up arranged Tuesday August 15, 2022 at 10:45 AM (Arrive by 10:30 AM). Elon Jester is one of our PAs that works with Dr. Clifton James. Contact information: [address] [place] [address] [phone]                Discharge Exam: Filed Weights   08/09/22 0500 08/10/22 0448 08/11/22 0424  Weight: 76.4 kg 79.7 kg 78.2 kg   General; NAD  Condition at discharge: stable  The results of significant diagnostics from this hospitalization (including imaging, microbiology, ancillary and laboratory) are listed below for reference.   Imaging Studies: DG CHEST PORT 1 VIEW  Result Date: 08/09/2022 CLINICAL DATA:  Shortness of breath EXAM: PORTABLE CHEST 1 VIEW COMPARISON:  Chest x-ray 08/02/2022 FINDINGS: Heart is enlarged. Patient is status post valve replacement. There is a small left pleural effusion. There some hazy and patchy opacities in the retrocardiac region similar to prior. Right lung is clear. No pneumothorax or acute fracture. IMPRESSION: 1. Cardiomegaly. 2. Small left  pleural effusion. 3. Hazy and patchy opacities in the retrocardiac region similar to prior. Electronically Signed   By: Darliss Cheney M.D.   On: 08/09/2022 14:35   DG Chest Port 1 View  Result Date: 08/02/2022 CLINICAL DATA:  Shortness of breath. EXAM: PORTABLE CHEST 1 VIEW COMPARISON:  08/01/2022 and CT chest 02/27/2022. FINDINGS: Trachea is midline. Heart is enlarged. Left atrial appendage clip. Aortic valve replacement. There may be retrocardiac opacification. Difficult to exclude a small left pleural effusion. No interstitial prominence and indistinctness. IMPRESSION: 1.  Suspect developing left lower lobe airspace opacification which may be due to atelectasis or pneumonia. 2. Small left pleural effusion. Electronically Signed   By: Leanna Battles M.D.   On: 08/02/2022 14:40   DG Chest 2 View  Result Date: 08/01/2022 CLINICAL DATA:  CHF.  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 Stable enlarged heart with some vascular congestion. Small left effusion. No pneumothorax or consolidation. Film is under penetrated. IMPRESSION: Enlarged heart with vascular congestion. Prosthetic valve and atrial occlusion clip. Small left effusion. Electronically Signed   By: Karen Kays M.D.   On: 08/01/2022 19:08   DG Pelvis Portable  Result Date: 07/29/2022 CLINICAL DATA:  72 year old male status post fall. EXAM: PORTABLE PELVIS 1-2 VIEWS COMPARISON:  CT Abdomen and Pelvis 01/17/2022. FINDINGS: 2 AP views of the pelvis at 0357 hours. Numerous chronic pelvic phleboliths incidentally noted. Femoral heads normally located. Bone mineralization is within normal limits. No pelvis fracture identified. SI joints appear symmetric, pubic symphysis within normal limits. Grossly intact proximal femurs. Aortoiliac calcified atherosclerosis. Negative visible bowel gas. IMPRESSION: 1. No acute fracture or dislocation identified about  the pelvis. 2.  Aortic Atherosclerosis (ICD10-I70.0). Electronically Signed   By: Odessa Fleming M.D.   On: 07/29/2022 04:10   CT Head Wo Contrast  Result Date: 07/29/2022 CLINICAL DATA:  Altered mental status. EXAM: CT HEAD WITHOUT CONTRAST TECHNIQUE: Contiguous axial images were obtained from the base of the skull through the vertex without intravenous contrast. RADIATION DOSE REDUCTION: This exam was performed according to the departmental dose-optimization program which includes automated exposure control, adjustment of the mA and/or kV according to patient size and/or use of iterative reconstruction technique. COMPARISON:  January 17, 2022 FINDINGS: Brain: There is mild cerebral atrophy with widening of the extra-axial spaces and ventricular dilatation. There are areas of decreased attenuation within the white matter tracts of the supratentorial brain, consistent with microvascular disease changes. A small, chronic left occipital lobe infarct is seen. Vascular: No hyperdense vessel or unexpected calcification. Skull: Normal. Negative for fracture or focal lesion. Sinuses/Orbits: Stable postoperative changes are seen involving the left globe. Other: None. IMPRESSION: 1. No acute intracranial abnormality. 2. Small, chronic left occipital lobe infarct. 3. Generalized cerebral atrophy with widening of the extra-axial spaces and ventricular dilatation. Electronically Signed   By: Aram Candela M.D.   On: 07/29/2022 00:20   DG Chest Port 1 View  Result Date: 07/28/2022 CLINICAL DATA:  Shortness of breath. EXAM: PORTABLE CHEST 1 VIEW COMPARISON:  March 02, 2022 FINDINGS: The cardiac silhouette is mildly enlarged and unchanged in size. Radiopaque atrial appendage clip and artificial aortic valve are seen. There is moderate to marked severity calcification of the thoracic aorta. Mild, diffusely increased lung markings are seen with mild prominence of the perihilar pulmonary vasculature. This is more prominent on  the left and may be positional in origin. No pleural effusion or pneumothorax is identified. Degenerative changes seen throughout the thoracic spine. IMPRESSION: 1. Cardiomegaly with findings suggestive of mild pulmonary vascular congestion. Electronically Signed   By: Aram Candela M.D.   On: 07/28/2022 23:43    Microbiology: Results for orders placed or performed during the hospital encounter of 08/02/22  MRSA Next Gen by PCR, Nasal     Status: Abnormal   Collection Time: 08/02/22  4:19 PM   Specimen: Nasal Mucosa; Nasal Swab  Result Value Ref Range Status   MRSA by PCR Next  Gen DETECTED (A) NOT DETECTED Final    Comment: (NOTE) The GeneXpert MRSA Assay (FDA approved for NASAL specimens only), is one component of a comprehensive MRSA colonization surveillance program. It is not intended to diagnose MRSA infection nor to guide or monitor treatment for MRSA infections. Test performance is not FDA approved in patients less than 55 years old. Performed at [facility], [address], [place], [place] [address]     Labs: CBC: Recent Labs  Lab 08/05/22 [phone] 08/06/22 0351 08/07/22 0523 08/08/22 0344 08/09/22 0406  WBC 2.2* 2.1* 2.4* 2.1* 2.9*  HGB 8.6* 8.4* 10.1* 9.7* 9.7*  HCT 26.2* 25.7* 31.1* 30.0* 29.4*  MCV 100.0 98.8 101.0* 101.4* 99.7  PLT 194 213 223 210 192   Basic Metabolic Panel: Recent Labs  Lab 08/06/22 0351 08/07/22 0523 08/08/22 0344 08/09/22 0406 08/10/22 0418 08/10/22 1030 08/11/22 0422  NA 136 137 134* 134* 137  --  136  K 3.4* 3.8 3.8 3.8 3.8  --  3.8  CL 102 100 98 97* 103  --  102  CO2 25 28 28 28 26   --  26  GLUCOSE 223* 277* 322* 275* 194*  --  274*  BUN 31* 27* 25* 25* 28*  --  33*  CREATININE 0.97 0.89 0.89 0.90 0.93  --  0.87  CALCIUM 8.6* 8.8* 8.3* 8.4* 8.4*  --  8.4*  MG 1.6*  --   --   --   --  1.9  --    Liver Function Tests: No results for input(s): "AST", "ALT", "ALKPHOS", "BILITOT", "PROT", "ALBUMIN" in the  last 168 hours. CBG: Recent Labs  Lab 08/10/22 0728 08/10/22 1144 08/10/22 1645 08/10/22 2121 08/11/22 0801  GLUCAP 175* 215* 178* 176* 221*    Discharge time spent: greater than 30 minutes.  Signed: Alba Cory, MD [facility] 08/11/2022
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
## 2022-08-11 NOTE — TOC Progression Note (Addendum)
Transition of Care [facility]) - Progression Note    Patient Details  Name: Mark Cabrera MRN: [id number] Date of Birth: 05/17/1950  [facility] of [facility]) CM/SW Contact  Larrie Kass, LCSW Phone Number: 08/11/2022, 2:42 PM  Clinical Narrative:    CSW spoke to [place] from [facility] , she reported they can accept pt, but not until Monday. TOC to follow   [place]  3:24pm  CSW spoke with pt's daughter to inform her [facility] does not have a bed until Monday. Pt's daughter inquired about transportation assistance.CSW informed her that can be determine on day of discharge. TOC to follow.      Barriers to Discharge: No Barriers Identified  Expected Discharge Plan and Services         Expected Discharge Date: 08/11/22                                     Social Determinants of Health (SDOH) Interventions SDOH Screenings   Food Insecurity: No Food Insecurity (08/02/2022)  Housing: Low Risk  (08/02/2022)  Transportation Needs: No Transportation Needs (08/02/2022)  Utilities: Not At Risk (08/02/2022)  Alcohol Screen: Low Risk  (01/18/2022)  Depression (PHQ2-9): Low Risk  (01/18/2022)  Financial Resource Strain: Medium Risk (01/18/2022)  Physical Activity: Inactive (01/18/2022)  Social Connections: Moderately Isolated (01/18/2022)  Stress: Stress Concern Present (01/18/2022)  Tobacco Use: Low Risk  (08/02/2022)    Readmission Risk Interventions     No data to display

## 2022-08-12 DIAGNOSIS — I5043 Acute on chronic combined systolic (congestive) and diastolic (congestive) heart failure: Secondary | ICD-10-CM | POA: Diagnosis not present

## 2022-08-12 LAB — BASIC METABOLIC PANEL
Anion gap: 9 (ref 5–15)
BUN: 32 mg/dL — ABNORMAL HIGH (ref 8–23)
CO2: 25 mmol/L (ref 22–32)
Calcium: 8.8 mg/dL — ABNORMAL LOW (ref 8.9–10.3)
Chloride: 104 mmol/L (ref 98–111)
Creatinine, Ser: 1.05 mg/dL (ref 0.61–1.24)
GFR, Estimated: >60 mL/min (ref >60)
Glucose, Bld: 145 mg/dL — ABNORMAL HIGH (ref 70–99)
Potassium: 4.1 mmol/L (ref 3.5–5.1)
Sodium: 138 mmol/L (ref 135–145)

## 2022-08-12 LAB — GLUCOSE, CAPILLARY
Glucose-Capillary: 152 mg/dL — ABNORMAL HIGH (ref 70–99)
Glucose-Capillary: 229 mg/dL — ABNORMAL HIGH (ref 70–99)
Glucose-Capillary: 163 mg/dL — ABNORMAL HIGH (ref 70–99)
Glucose-Capillary: 72 mg/dL (ref 70–99)

## 2022-08-12 LAB — MAGNESIUM: Magnesium: 2.2 mg/dL (ref 1.7–2.4)

## 2022-08-12 MED ORDER — SALINE SPRAY 0.65 % NA SOLN
1.0000 | NASAL | Status: DC | PRN
  Filled 2022-08-12: qty 44

## 2022-08-12 NOTE — Progress Notes (Signed)
PROGRESS NOTE    Mark Cabrera  ZOX:[id number] DOB: Sep 21, 1950 DOA: 08/02/2022 PCP: Evlyn Kanner, MD   Brief Narrative: 72 year old with past medical history significant for combined systolic and diastolic heart failure, dementia presenting to the hospital with complaining of behavioral disturbance from his nursing home, as well as suspected acute on chronic combined CHF.  Patient has been having several ED visits in the last several days due to report of increased behavioral disturbance, but in the ER he has been calm.  Due to multiple ED visits he has missed his scheduled medications for his heart failure.   Assessment & Plan:   Principal Problem:   Acute on chronic combined systolic and diastolic CHF (congestive heart failure) (HCC) Active Problems:   Dyslipidemia   Chronic diastolic heart failure (HCC)   CAD (coronary artery disease), native coronary artery   Paroxysmal atrial fibrillation (HCC)   Aggressive behavior   Dementia with behavioral disturbance (HCC)   Acute systolic (congestive) heart failure (HCC)   Acute on chronic systolic CHF (congestive heart failure) (HCC)  1-Acute on chronic combined CHF: -He had congestion on chest x-ray, BNP was elevated on admission. -Cardiology was consulted and followed patient while hospitalized. -Patient improved with IV Lasix, subsequently transition to oral -Patient will be discharged on lasix 40 mg twice daily.  -He was more agitated the  afternoon 5/29. He report SOB. He is in not distress. Sat 100 RA. Chest x ray with small left pleural effusion, patchy opacity retrocardiac region. Received extra dos of IV lasix 5/29. -Denies  dyspnea, BNP trending down to 1052. -Continue with lasix to 40 mg BID>  -Negative 11 L.   2-Dementia, with  behavioral disturbance. -No significant behavioral issues since he has been in the hospital. -Aricept has been discontinued by his neurologist. -Continue Depakote, Lexapro, Seroquel,  Buspar -Continue with  Ativan for anxiety PRN.   -B 12: 522. Ammonia level normal, thiamine level pending.  -Started on thiamine.   3-PAF: Continue with Eliquis and metoprolol  4-Hypertension: Continue with metoprolol  5-Hypokalemia: Replaced  Anemia: Monitor  Hyponatremia in setting of heart failure.  Normalized Chronic Leukopenia. Stable. Monitor . B 12 normal. Has history of cirrhosis.  Diabetes type 2; hyperglycemia. Uncontrolled. Increase semglee to 25 units . Continue with SSI.  Moderate malnutrition; on supplement.  See wound care documentation below.  Pressure Injury 01/23/22 Coccyx Mid Stage 2 -  Partial thickness loss of dermis presenting as a shallow open injury with a red, pink wound bed without slough. (Active)  01/23/22 1915  Location: Coccyx  Location Orientation: Mid  Staging: Stage 2 -  Partial thickness loss of dermis presenting as a shallow open injury with a red, pink wound bed without slough.  Wound Description (Comments):   Present on Admission:      Nutrition Problem: Moderate Malnutrition Etiology: chronic illness    Signs/Symptoms: moderate fat depletion, moderate muscle depletion    Interventions: Refer to RD note for recommendations, Ensure Enlive (each supplement provides 350kcal and 20 grams of protein), MVI  Estimated body mass index is 26.21 kg/m as calculated from the following:   Height as of this encounter: 5\' 8"  (1.727 m).   Weight as of this encounter: 78.2 kg.   DVT prophylaxis: Eliquis Code Status: DNR Family Communication: Daughter over phone 5/29 Disposition Plan:  Status is: Inpatient Remains inpatient appropriate because: for Rehab on Monday    Consultants:  Cardiology   Procedures:  none  Antimicrobials:    Subjective: He  denies dyspnea. He has been calm and cooperative  Objective: Vitals:   08/11/22 1803 08/11/22 2338 08/12/22 0639 08/12/22 1131  BP: 107/68 (!) 114/55 116/74 110/75  Pulse: 81 (!) 54 64  (!) 52  Resp:  17 15 18   Temp:  (!) 97.5 F (36.4 C) 97.7 F (36.5 C) 97.7 F (36.5 C)  TempSrc:  Oral Oral Oral  SpO2: 99% 100% 100% 93%  Weight:      Height:        Intake/Output Summary (Last 24 hours) at 08/12/2022 1530 Last data filed at 08/12/2022 0900 Gross per 24 hour  Intake 805 ml  Output 1600 ml  Net -795 ml    Filed Weights   08/09/22 0500 08/10/22 0448 08/11/22 0424  Weight: 76.4 kg 79.7 kg 78.2 kg    Examination:  General exam: NAD Respiratory system: CTA Cardiovascular system: SS 1, S 2 RRR Gastrointestinal system: BS present, soft, nt Central nervous system: alert,  Extremities: no edema    Data Reviewed: I have personally reviewed following labs and imaging studies  CBC: Recent Labs  Lab 08/06/22 0351 08/07/22 0523 08/08/22 0344 08/09/22 0406  WBC 2.1* 2.4* 2.1* 2.9*  HGB 8.4* 10.1* 9.7* 9.7*  HCT 25.7* 31.1* 30.0* 29.4*  MCV 98.8 101.0* 101.4* 99.7  PLT 213 223 210 192    Basic Metabolic Panel: Recent Labs  Lab 08/06/22 0351 08/07/22 0523 08/08/22 0344 08/09/22 0406 08/10/22 0418 08/10/22 1030 08/11/22 0422 08/11/22 1817 08/12/22 0834  NA 136   < > 134* 134* 137  --  136  --  138  K 3.4*   < > 3.8 3.8 3.8  --  3.8  --  4.1  CL 102   < > 98 97* 103  --  102  --  104  CO2 25   < > 28 28 26   --  26  --  25  GLUCOSE 223*   < > 322* 275* 194*  --  274*  --  145*  BUN 31*   < > 25* 25* 28*  --  33*  --  32*  CREATININE 0.97   < > 0.89 0.90 0.93  --  0.87  --  1.05  CALCIUM 8.6*   < > 8.3* 8.4* 8.4*  --  8.4*  --  8.8*  MG 1.6*  --   --   --   --  1.9  --  1.7 2.2   < > = values in this interval not displayed.    GFR: Estimated Creatinine Clearance: 60.6 mL/min (by C-G formula based on SCr of 1.05 mg/dL). Liver Function Tests: No results for input(s): "AST", "ALT", "ALKPHOS", "BILITOT", "PROT", "ALBUMIN" in the last 168 hours. No results for input(s): "LIPASE", "AMYLASE" in the last 168 hours. Recent Labs  Lab 08/09/22 1307   AMMONIA 14    Coagulation Profile: No results for input(s): "INR", "PROTIME" in the last 168 hours. Cardiac Enzymes: No results for input(s): "CKTOTAL", "CKMB", "CKMBINDEX", "TROPONINI" in the last 168 hours. BNP (last 3 results) No results for input(s): "PROBNP" in the last 8760 hours. HbA1C: No results for input(s): "HGBA1C" in the last 72 hours. CBG: Recent Labs  Lab 08/11/22 1156 08/11/22 1650 08/11/22 2303 08/12/22 0756 08/12/22 1120  GLUCAP 256* 169* 102* 152* 229*    Lipid Profile: No results for input(s): "CHOL", "HDL", "LDLCALC", "TRIG", "CHOLHDL", "LDLDIRECT" in the last 72 hours. Thyroid Function Tests: No results for input(s): "TSH", "T4TOTAL", "FREET4", "T3FREE", "THYROIDAB" in the  last 72 hours. Anemia Panel: Recent Labs    08/11/22 0422  FOLATE 9.7    Sepsis Labs: Recent Labs  Lab 08/09/22 1307  PROCALCITON <0.10     Recent Results (from the past 240 hour(s))  MRSA Next Gen by PCR, Nasal     Status: Abnormal   Collection Time: 08/02/22  4:19 PM   Specimen: Nasal Mucosa; Nasal Swab  Result Value Ref Range Status   MRSA by PCR Next Gen DETECTED (A) NOT DETECTED Final    Comment: (NOTE) The GeneXpert MRSA Assay (FDA approved for NASAL specimens only), is one component of a comprehensive MRSA colonization surveillance program. It is not intended to diagnose MRSA infection nor to guide or monitor treatment for MRSA infections. Test performance is not FDA approved in patients less than 59 years old. Performed at [facility], [address], [place], [place] [address]          Radiology Studies: No results found.      Scheduled Meds:  apixaban  5 mg Oral BID   busPIRone  7.5 mg Oral BID   divalproex  250 mg Oral BID   docusate sodium  100 mg Oral BID   escitalopram  10 mg Oral Daily   feeding supplement  237 mL Oral BID BM   furosemide  40 mg Oral BID   insulin aspart  0-15 Units Subcutaneous TID WC   insulin  aspart  0-5 Units Subcutaneous QHS   insulin aspart  3 Units Subcutaneous TID WC   insulin glargine-yfgn  25 Units Subcutaneous Daily   metoprolol succinate  12.5 mg Oral Daily   multivitamin with minerals  1 tablet Oral Daily   potassium chloride  10 mEq Oral Daily   QUEtiapine  75 mg Oral QHS   spironolactone  12.5 mg Oral Daily   thiamine  100 mg Oral Daily   valACYclovir  1,000 mg Oral Daily   Continuous Infusions:   LOS: 9 days    Time spent: 35 minutes    Victorina Kable A Gwendolyn Nishi, MD [facility]   If 7PM-7AM, please contact night-coverage [email or web address]  08/12/2022, 3:30 PM

## 2022-08-13 DIAGNOSIS — I5043 Acute on chronic combined systolic (congestive) and diastolic (congestive) heart failure: Secondary | ICD-10-CM | POA: Diagnosis not present

## 2022-08-13 LAB — CBC
HCT: 27 % — ABNORMAL LOW (ref 39.0–52.0)
Hemoglobin: 8.7 g/dL — ABNORMAL LOW (ref 13.0–17.0)
MCH: 32.3 pg (ref 26.0–34.0)
MCHC: 32.2 g/dL (ref 30.0–36.0)
MCV: 100.4 fL — ABNORMAL HIGH (ref 80.0–100.0)
Platelets: 180 10*3/uL (ref 150–400)
RBC: 2.69 MIL/uL — ABNORMAL LOW (ref 4.22–5.81)
RDW: 17 % — ABNORMAL HIGH (ref 11.5–15.5)
WBC: 2.3 10*3/uL — ABNORMAL LOW (ref 4.0–10.5)
nRBC: 0 % (ref 0.0–0.2)

## 2022-08-13 LAB — GLUCOSE, CAPILLARY
Glucose-Capillary: 238 mg/dL — ABNORMAL HIGH (ref 70–99)
Glucose-Capillary: 199 mg/dL — ABNORMAL HIGH (ref 70–99)
Glucose-Capillary: 121 mg/dL — ABNORMAL HIGH (ref 70–99)
Glucose-Capillary: 213 mg/dL — ABNORMAL HIGH (ref 70–99)

## 2022-08-13 LAB — VITAMIN B1: Vitamin B1 (Thiamine): 119.9 nmol/L (ref 66.5–200.0)

## 2022-08-13 NOTE — Progress Notes (Signed)
PROGRESS NOTE    Mark Cabrera  MVH:[id number] DOB: 25-Sep-1950 DOA: 08/02/2022 PCP: Evlyn Kanner, MD   Brief Narrative: 72 year old with past medical history significant for combined systolic and diastolic heart failure, dementia presenting to the hospital with complaining of behavioral disturbance from his nursing home, as well as suspected acute on chronic combined CHF.  Patient has been having several ED visits in the last several days due to report of increased behavioral disturbance, but in the ER he has been calm.  Due to multiple ED visits he has missed his scheduled medications for his heart failure.   Assessment & Plan:   Principal Problem:   Acute on chronic combined systolic and diastolic CHF (congestive heart failure) (HCC) Active Problems:   Dyslipidemia   Chronic diastolic heart failure (HCC)   CAD (coronary artery disease), native coronary artery   Paroxysmal atrial fibrillation (HCC)   Aggressive behavior   Dementia with behavioral disturbance (HCC)   Acute systolic (congestive) heart failure (HCC)   Acute on chronic systolic CHF (congestive heart failure) (HCC)  1-Acute on chronic combined CHF: -He had congestion on chest x-ray, BNP was elevated on admission. -Cardiology was consulted and followed patient while hospitalized. -Patient improved with IV Lasix, subsequently transition to oral -Patient will be discharged on lasix 40 mg twice daily.  -He was more agitated the  afternoon 5/29. He report SOB. He is in not distress. Sat 100 RA. Chest x ray with small left pleural effusion, patchy opacity retrocardiac region. Received extra dos of IV lasix 5/29. -Denies  dyspnea, BNP trending down to 1052. -Continue with lasix to 40 mg BID>  -Negative 12 L.  Stable. No new complaints.   2-Dementia, with  behavioral disturbance. -No significant behavioral issues since he has been in the hospital. -Aricept has been discontinued by his neurologist. -Continue Depakote,  Lexapro, Seroquel, Buspar -Continue with  Ativan for anxiety PRN.   -B 12: 522. Ammonia level normal, thiamine level pending.  -Started on thiamine.  No behavior issues.   3-PAF: Continue with Eliquis and metoprolol  4-Hypertension: Continue with metoprolol  5-Hypokalemia: Replaced  Anemia: Monitor  Hyponatremia in setting of heart failure.  Normalized Chronic Leukopenia. Stable. Monitor . B 12 normal. Has history of cirrhosis.  Diabetes type 2; hyperglycemia. Uncontrolled. Increase semglee to 25 units . Continue with SSI.  Moderate malnutrition; on supplement.  See wound care documentation below.  Pressure Injury 01/23/22 Coccyx Mid Stage 2 -  Partial thickness loss of dermis presenting as a shallow open injury with a red, pink wound bed without slough. (Active)  01/23/22 1915  Location: Coccyx  Location Orientation: Mid  Staging: Stage 2 -  Partial thickness loss of dermis presenting as a shallow open injury with a red, pink wound bed without slough.  Wound Description (Comments):   Present on Admission:      Nutrition Problem: Moderate Malnutrition Etiology: chronic illness    Signs/Symptoms: moderate fat depletion, moderate muscle depletion    Interventions: Refer to RD note for recommendations, Ensure Enlive (each supplement provides 350kcal and 20 grams of protein), MVI  Estimated body mass index is 26.25 kg/m as calculated from the following:   Height as of this encounter: 5\' 8"  (1.727 m).   Weight as of this encounter: 78.3 kg.   DVT prophylaxis: Eliquis Code Status: DNR Family Communication: Daughter over phone 5/29 Disposition Plan:  Status is: Inpatient Remains inpatient appropriate because: for Rehab on Monday    Consultants:  Cardiology   Procedures:  none  Antimicrobials:    Subjective: He denies dyspnea. He is conversant. 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 He told me a story about his brother in law, how they used to compete in running.    Objective: Vitals:   08/12/22 2119 08/13/22 0426 08/13/22 0850 08/13/22 1108  BP: 113/79 120/76 125/73 116/68  Pulse: 65 77 79 78  Resp: 14 16  17   Temp: 98.3 F (36.8 C) 97.7 F (36.5 C)  97.9 F (36.6 C)  TempSrc: Oral Oral  Oral  SpO2: 93% 93%  98%  Weight:  78.3 kg    Height:        Intake/Output Summary (Last 24 hours) at 08/13/2022 1239 Last data filed at 08/13/2022 1100 Gross per 24 hour  Intake 900 ml  Output 1850 ml  Net -950 ml    Filed Weights   08/10/22 0448 08/11/22 0424 08/13/22 0426  Weight: 79.7 kg 78.2 kg 78.3 kg    Examination:  General exam: NAD Respiratory system: CTA Cardiovascular system: S 1, S 2 RRR Gastrointestinal system: BS present, soft, , nt Central nervous system: Alert Extremities: No edema    Data Reviewed: I have personally reviewed following labs and imaging studies  CBC: Recent Labs  Lab 08/07/22 0523 08/08/22 0344 08/09/22 0406 08/13/22 0326  WBC 2.4* 2.1* 2.9* 2.3*  HGB 10.1* 9.7* 9.7* 8.7*  HCT 31.1* 30.0* 29.4* 27.0*  MCV 101.0* 101.4* 99.7 100.4*  PLT 223 210 192 180    Basic Metabolic Panel: Recent Labs  Lab 08/08/22 0344 08/09/22 0406 08/10/22 0418 08/10/22 1030 08/11/22 0422 08/11/22 1817 08/12/22 0834  NA 134* 134* 137  --  136  --  138  K 3.8 3.8 3.8  --  3.8  --  4.1  CL 98 97* 103  --  102  --  104  CO2 28 28 26   --  26  --  25  GLUCOSE 322* 275* 194*  --  274*  --  145*  BUN 25* 25* 28*  --  33*  --  32*  CREATININE 0.89 0.90 0.93  --  0.87  --  1.05  CALCIUM 8.3* 8.4* 8.4*  --  8.4*  --  8.8*  MG  --   --   --  1.9  --  1.7 2.2    GFR: Estimated Creatinine Clearance: 60.6 mL/min (by C-G formula based on SCr of 1.05 mg/dL). Liver Function Tests: No results for input(s): "AST", "ALT", "ALKPHOS", "BILITOT", "PROT", "ALBUMIN" in the last 168 hours. No results for input(s): "LIPASE", "AMYLASE" in the last 168 hours. Recent Labs  Lab 08/09/22 1307  AMMONIA 14    Coagulation Profile: No  results for input(s): "INR", "PROTIME" in the last 168 hours. Cardiac Enzymes: No results for input(s): "CKTOTAL", "CKMB", "CKMBINDEX", "TROPONINI" in the last 168 hours. BNP (last 3 results) No results for input(s): "PROBNP" in the last 8760 hours. HbA1C: No results for input(s): "HGBA1C" in the last 72 hours. CBG: Recent Labs  Lab 08/12/22 1120 08/12/22 1656 08/12/22 2118 08/13/22 0729 08/13/22 1120  GLUCAP 229* 163* 72 238* 199*    Lipid Profile: No results for input(s): "CHOL", "HDL", "LDLCALC", "TRIG", "CHOLHDL", "LDLDIRECT" in the last 72 hours. Thyroid Function Tests: No results for input(s): "TSH", "T4TOTAL", "FREET4", "T3FREE", "THYROIDAB" in the last 72 hours. Anemia Panel: Recent Labs    08/11/22 0422  FOLATE 9.7    Sepsis Labs: Recent Labs  Lab 08/09/22 1307  PROCALCITON <0.10  No results found for this or any previous visit (from the past 240 hour(s)).        Radiology Studies: No results found.      Scheduled Meds:  apixaban  5 mg Oral BID   busPIRone  7.5 mg Oral BID   divalproex  250 mg Oral BID   docusate sodium  100 mg Oral BID   escitalopram  10 mg Oral Daily   feeding supplement  237 mL Oral BID BM   furosemide  40 mg Oral BID   insulin aspart  0-15 Units Subcutaneous TID WC   insulin aspart  0-5 Units Subcutaneous QHS   insulin aspart  3 Units Subcutaneous TID WC   insulin glargine-yfgn  25 Units Subcutaneous Daily   metoprolol succinate  12.5 mg Oral Daily   multivitamin with minerals  1 tablet Oral Daily   potassium chloride  10 mEq Oral Daily   QUEtiapine  75 mg Oral QHS   spironolactone  12.5 mg Oral Daily   thiamine  100 mg Oral Daily   valACYclovir  1,000 mg Oral Daily   Continuous Infusions:   LOS: 10 days    Time spent: 35 minutes    Sheetal Lyall A Shaneka Efaw, MD [facility]   If 7PM-7AM, please contact night-coverage [email or web address]  08/13/2022, 12:39 PM

## 2022-08-14 DIAGNOSIS — E559 Vitamin D deficiency, unspecified: Secondary | ICD-10-CM | POA: Diagnosis not present

## 2022-08-14 DIAGNOSIS — R079 Chest pain, unspecified: Secondary | ICD-10-CM | POA: Diagnosis not present

## 2022-08-14 DIAGNOSIS — E860 Dehydration: Secondary | ICD-10-CM | POA: Diagnosis present

## 2022-08-14 DIAGNOSIS — Z952 Presence of prosthetic heart valve: Secondary | ICD-10-CM | POA: Diagnosis not present

## 2022-08-14 DIAGNOSIS — R0989 Other specified symptoms and signs involving the circulatory and respiratory systems: Secondary | ICD-10-CM | POA: Diagnosis not present

## 2022-08-14 DIAGNOSIS — S2242XA Multiple fractures of ribs, left side, initial encounter for closed fracture: Secondary | ICD-10-CM | POA: Diagnosis present

## 2022-08-14 DIAGNOSIS — M160 Bilateral primary osteoarthritis of hip: Secondary | ICD-10-CM | POA: Diagnosis not present

## 2022-08-14 DIAGNOSIS — I5043 Acute on chronic combined systolic (congestive) and diastolic (congestive) heart failure: Secondary | ICD-10-CM | POA: Diagnosis not present

## 2022-08-14 DIAGNOSIS — Z794 Long term (current) use of insulin: Secondary | ICD-10-CM | POA: Diagnosis not present

## 2022-08-14 DIAGNOSIS — R531 Weakness: Secondary | ICD-10-CM | POA: Diagnosis not present

## 2022-08-14 DIAGNOSIS — I255 Ischemic cardiomyopathy: Secondary | ICD-10-CM | POA: Diagnosis present

## 2022-08-14 DIAGNOSIS — R5381 Other malaise: Secondary | ICD-10-CM | POA: Diagnosis not present

## 2022-08-14 DIAGNOSIS — F03918 Unspecified dementia, unspecified severity, with other behavioral disturbance: Secondary | ICD-10-CM | POA: Diagnosis not present

## 2022-08-14 DIAGNOSIS — E038 Other specified hypothyroidism: Secondary | ICD-10-CM | POA: Diagnosis not present

## 2022-08-14 DIAGNOSIS — I5042 Chronic combined systolic (congestive) and diastolic (congestive) heart failure: Secondary | ICD-10-CM | POA: Diagnosis present

## 2022-08-14 DIAGNOSIS — E46 Unspecified protein-calorie malnutrition: Secondary | ICD-10-CM | POA: Diagnosis not present

## 2022-08-14 DIAGNOSIS — R680 Hypothermia, not associated with low environmental temperature: Secondary | ICD-10-CM | POA: Diagnosis present

## 2022-08-14 DIAGNOSIS — E1140 Type 2 diabetes mellitus with diabetic neuropathy, unspecified: Secondary | ICD-10-CM | POA: Diagnosis not present

## 2022-08-14 DIAGNOSIS — E1149 Type 2 diabetes mellitus with other diabetic neurological complication: Secondary | ICD-10-CM | POA: Diagnosis present

## 2022-08-14 DIAGNOSIS — I700 Atherosclerosis of aorta: Secondary | ICD-10-CM | POA: Diagnosis not present

## 2022-08-14 DIAGNOSIS — S0990XA Unspecified injury of head, initial encounter: Secondary | ICD-10-CM | POA: Diagnosis not present

## 2022-08-14 DIAGNOSIS — M5030 Other cervical disc degeneration, unspecified cervical region: Secondary | ICD-10-CM | POA: Diagnosis not present

## 2022-08-14 DIAGNOSIS — Z7401 Bed confinement status: Secondary | ICD-10-CM | POA: Diagnosis not present

## 2022-08-14 DIAGNOSIS — N179 Acute kidney failure, unspecified: Secondary | ICD-10-CM | POA: Diagnosis not present

## 2022-08-14 DIAGNOSIS — Z7189 Other specified counseling: Secondary | ICD-10-CM | POA: Diagnosis not present

## 2022-08-14 DIAGNOSIS — D510 Vitamin B12 deficiency anemia due to intrinsic factor deficiency: Secondary | ICD-10-CM | POA: Diagnosis not present

## 2022-08-14 DIAGNOSIS — F3132 Bipolar disorder, current episode depressed, moderate: Secondary | ICD-10-CM | POA: Diagnosis not present

## 2022-08-14 DIAGNOSIS — I350 Nonrheumatic aortic (valve) stenosis: Secondary | ICD-10-CM | POA: Diagnosis not present

## 2022-08-14 DIAGNOSIS — I480 Paroxysmal atrial fibrillation: Secondary | ICD-10-CM | POA: Diagnosis present

## 2022-08-14 DIAGNOSIS — M47812 Spondylosis without myelopathy or radiculopathy, cervical region: Secondary | ICD-10-CM | POA: Diagnosis not present

## 2022-08-14 DIAGNOSIS — R296 Repeated falls: Secondary | ICD-10-CM | POA: Diagnosis not present

## 2022-08-14 DIAGNOSIS — Z951 Presence of aortocoronary bypass graft: Secondary | ICD-10-CM | POA: Diagnosis not present

## 2022-08-14 DIAGNOSIS — Z043 Encounter for examination and observation following other accident: Secondary | ICD-10-CM | POA: Diagnosis not present

## 2022-08-14 DIAGNOSIS — E440 Moderate protein-calorie malnutrition: Secondary | ICD-10-CM | POA: Diagnosis not present

## 2022-08-14 DIAGNOSIS — Z79899 Other long term (current) drug therapy: Secondary | ICD-10-CM | POA: Diagnosis not present

## 2022-08-14 DIAGNOSIS — I517 Cardiomegaly: Secondary | ICD-10-CM | POA: Diagnosis not present

## 2022-08-14 DIAGNOSIS — Z1152 Encounter for screening for COVID-19: Secondary | ICD-10-CM | POA: Diagnosis not present

## 2022-08-14 DIAGNOSIS — G9389 Other specified disorders of brain: Secondary | ICD-10-CM | POA: Diagnosis not present

## 2022-08-14 DIAGNOSIS — E119 Type 2 diabetes mellitus without complications: Secondary | ICD-10-CM | POA: Diagnosis not present

## 2022-08-14 DIAGNOSIS — E782 Mixed hyperlipidemia: Secondary | ICD-10-CM | POA: Diagnosis not present

## 2022-08-14 DIAGNOSIS — F419 Anxiety disorder, unspecified: Secondary | ICD-10-CM | POA: Diagnosis not present

## 2022-08-14 DIAGNOSIS — R262 Difficulty in walking, not elsewhere classified: Secondary | ICD-10-CM | POA: Diagnosis not present

## 2022-08-14 DIAGNOSIS — R6510 Systemic inflammatory response syndrome (SIRS) of non-infectious origin without acute organ dysfunction: Secondary | ICD-10-CM | POA: Diagnosis present

## 2022-08-14 DIAGNOSIS — Z9181 History of falling: Secondary | ICD-10-CM | POA: Diagnosis not present

## 2022-08-14 DIAGNOSIS — E569 Vitamin deficiency, unspecified: Secondary | ICD-10-CM | POA: Diagnosis not present

## 2022-08-14 DIAGNOSIS — S2242XD Multiple fractures of ribs, left side, subsequent encounter for fracture with routine healing: Secondary | ICD-10-CM | POA: Diagnosis not present

## 2022-08-14 DIAGNOSIS — D72819 Decreased white blood cell count, unspecified: Secondary | ICD-10-CM | POA: Diagnosis not present

## 2022-08-14 DIAGNOSIS — I10 Essential (primary) hypertension: Secondary | ICD-10-CM | POA: Diagnosis not present

## 2022-08-14 DIAGNOSIS — Z22322 Carrier or suspected carrier of Methicillin resistant Staphylococcus aureus: Secondary | ICD-10-CM | POA: Diagnosis not present

## 2022-08-14 DIAGNOSIS — M25559 Pain in unspecified hip: Secondary | ICD-10-CM | POA: Diagnosis not present

## 2022-08-14 DIAGNOSIS — R52 Pain, unspecified: Secondary | ICD-10-CM | POA: Diagnosis not present

## 2022-08-14 DIAGNOSIS — E1165 Type 2 diabetes mellitus with hyperglycemia: Secondary | ICD-10-CM | POA: Diagnosis not present

## 2022-08-14 DIAGNOSIS — I110 Hypertensive heart disease with heart failure: Secondary | ICD-10-CM | POA: Diagnosis present

## 2022-08-14 DIAGNOSIS — D649 Anemia, unspecified: Secondary | ICD-10-CM | POA: Diagnosis not present

## 2022-08-14 DIAGNOSIS — M6281 Muscle weakness (generalized): Secondary | ICD-10-CM | POA: Diagnosis not present

## 2022-08-14 DIAGNOSIS — M549 Dorsalgia, unspecified: Secondary | ICD-10-CM | POA: Diagnosis not present

## 2022-08-14 DIAGNOSIS — G4731 Primary central sleep apnea: Secondary | ICD-10-CM | POA: Diagnosis present

## 2022-08-14 DIAGNOSIS — Z66 Do not resuscitate: Secondary | ICD-10-CM | POA: Diagnosis present

## 2022-08-14 DIAGNOSIS — K7460 Unspecified cirrhosis of liver: Secondary | ICD-10-CM | POA: Diagnosis present

## 2022-08-14 DIAGNOSIS — F02C2 Dementia in other diseases classified elsewhere, severe, with psychotic disturbance: Secondary | ICD-10-CM | POA: Diagnosis not present

## 2022-08-14 DIAGNOSIS — R918 Other nonspecific abnormal finding of lung field: Secondary | ICD-10-CM | POA: Diagnosis not present

## 2022-08-14 DIAGNOSIS — I6782 Cerebral ischemia: Secondary | ICD-10-CM | POA: Diagnosis not present

## 2022-08-14 DIAGNOSIS — E872 Acidosis, unspecified: Secondary | ICD-10-CM | POA: Diagnosis present

## 2022-08-14 DIAGNOSIS — J90 Pleural effusion, not elsewhere classified: Secondary | ICD-10-CM | POA: Diagnosis not present

## 2022-08-14 DIAGNOSIS — T68XXXD Hypothermia, subsequent encounter: Secondary | ICD-10-CM | POA: Diagnosis not present

## 2022-08-14 DIAGNOSIS — I5020 Unspecified systolic (congestive) heart failure: Secondary | ICD-10-CM | POA: Diagnosis not present

## 2022-08-14 DIAGNOSIS — R627 Adult failure to thrive: Secondary | ICD-10-CM | POA: Diagnosis present

## 2022-08-14 DIAGNOSIS — M25522 Pain in left elbow: Secondary | ICD-10-CM | POA: Diagnosis not present

## 2022-08-14 DIAGNOSIS — W19XXXA Unspecified fall, initial encounter: Secondary | ICD-10-CM | POA: Diagnosis not present

## 2022-08-14 DIAGNOSIS — T68XXXA Hypothermia, initial encounter: Secondary | ICD-10-CM | POA: Diagnosis not present

## 2022-08-14 DIAGNOSIS — Z8249 Family history of ischemic heart disease and other diseases of the circulatory system: Secondary | ICD-10-CM | POA: Diagnosis not present

## 2022-08-14 DIAGNOSIS — R456 Violent behavior: Secondary | ICD-10-CM | POA: Diagnosis not present

## 2022-08-14 DIAGNOSIS — I2510 Atherosclerotic heart disease of native coronary artery without angina pectoris: Secondary | ICD-10-CM | POA: Diagnosis present

## 2022-08-14 DIAGNOSIS — R5383 Other fatigue: Secondary | ICD-10-CM | POA: Diagnosis not present

## 2022-08-14 DIAGNOSIS — E785 Hyperlipidemia, unspecified: Secondary | ICD-10-CM | POA: Diagnosis present

## 2022-08-14 DIAGNOSIS — Z743 Need for continuous supervision: Secondary | ICD-10-CM | POA: Diagnosis not present

## 2022-08-14 DIAGNOSIS — M25552 Pain in left hip: Secondary | ICD-10-CM | POA: Diagnosis not present

## 2022-08-14 DIAGNOSIS — Z515 Encounter for palliative care: Secondary | ICD-10-CM | POA: Diagnosis not present

## 2022-08-14 LAB — GLUCOSE, CAPILLARY
Glucose-Capillary: 116 mg/dL — ABNORMAL HIGH (ref 70–99)
Glucose-Capillary: 264 mg/dL — ABNORMAL HIGH (ref 70–99)

## 2022-08-14 NOTE — TOC Transition Note (Signed)
Transition of Care [facility]) - CM/SW Discharge Note   Patient Details  Name: Mark Cabrera MRN: [id number] Date of Birth: 03/16/1950  Transition of [facility]) CM/SW Contact:  Howell Rucks, RN Phone Number: 08/14/2022, 10:25 AM   Clinical Narrative:   DC today, [facility]. Mary (dtr) notified, reports on her way to the hospital. [facility] for transport.     Final next level of care: Skilled Nursing Facility Barriers to Discharge: Barriers Resolved   Patient Goals and CMS Choice CMS Medicare.gov Compare Post Acute Care list provided to:: Patient Represenative (must comment) Choice offered to / list presented to : Adult Children  Discharge Placement                Patient chooses bed at: [facility] Patient to be transferred to facility by: [facility] Name of family member notified: [name] (dtr) Patient and family notified of of transfer: 08/14/22  Discharge Plan and Services Additional resources added to the After Visit Summary for                                       Social Determinants of Health (SDOH) Interventions SDOH Screenings   Food Insecurity: No Food Insecurity (08/02/2022)  Housing: Low Risk  (08/02/2022)  Transportation Needs: No Transportation Needs (08/02/2022)  Utilities: Not At Risk (08/02/2022)  Alcohol Screen: Low Risk  (01/18/2022)  Depression (PHQ2-9): Low Risk  (01/18/2022)  Financial Resource Strain: Medium Risk (01/18/2022)  Physical Activity: Inactive (01/18/2022)  Social Connections: Moderately Isolated (01/18/2022)  Stress: Stress Concern Present (01/18/2022)  Tobacco Use: Low Risk  (08/02/2022)     Readmission Risk Interventions     No data to display

## 2022-08-14 NOTE — Progress Notes (Addendum)
I called Mark Cabrera ([address] hall nurse [phone]) three times at 1145, 1150, and 1206. First two times no one answered the calls. At 1206, the front dest people answered the phone call and transferred to the nurse line, but still no one answered the call and no way to leave message.

## 2022-08-14 NOTE — Discharge Summary (Signed)
Physician Discharge Summary   Patient: Mark Cabrera MRN: [id number] DOB: 17-Sep-1950  Admit date:     08/02/2022  Discharge date: 08/14/22  Discharge Physician: Alba Cory   PCP: Evlyn Kanner, MD   Recommendations at discharge:   Needs Bmet to follow renal function and potassium.  Monitor weight, adjust lasix as needed.   Discharge Diagnoses: Principal Problem:   Acute on chronic combined systolic and diastolic CHF (congestive heart failure) (HCC) Active Problems:   Dyslipidemia   Chronic diastolic heart failure (HCC)   CAD (coronary artery disease), native coronary artery   Paroxysmal atrial fibrillation (HCC)   Aggressive behavior   Dementia with behavioral disturbance (HCC)   Acute systolic (congestive) heart failure (HCC)   Acute on chronic systolic CHF (congestive heart failure) (HCC)  Resolved Problems:   * No resolved hospital problems. *  Hospital Course: 72 year old with past medical history significant for combined systolic and diastolic heart failure, dementia presenting to the hospital with complaining of behavioral disturbance from his nursing home, as well as suspected acute on chronic combined CHF.  Patient has been having several ED visits in the last several days due to report of increased behavioral disturbance, but in the ER he has been calm.  Due to multiple ED visits he has missed his scheduled medications for his heart failure.    Assessment and Plan: 1-Acute on chronic combined CHF: -He had congestion on chest x-ray, BNP was elevated on admission. -Cardiology was consulted and followed patient while hospitalized. -Patient improved with IV Lasix, subsequently transition to oral -Patient will be discharged on lasix 40 mg twice daily.  -He was more agitated the  afternoon 5/29. He report SOB. He is in not distress. Sat 100 RA. Chest x ray with small left pleural effusion, patchy opacity retrocardiac region. Received extra dos of IV lasix  5/29. BNP trending down to 1052. Continue with change lasix to 40 mg BID>  Negative 10 L.  He denies Dyspnea, stable on 2 L oxygen. Weight down to 172---from 187 on admission.  Stable. \ 2-Dementia, with  behavioral disturbance. No significant behavioral issues since he has been in the hospital. Aricept has been discontinued by his neurologist. Continue Depakote, Lexapro, Seroquel, Buspar Continue with  Ativan for anxiety PRN.  B 12: 522. Ammonia level normal, thiamine level pending.  Started on thiamine.  He has been calm. Ativan help with agitation. Will discharge with PRN ativan.  Seroquel and Depakote dose was increased./   3-PAF: Continue with Eliquis and metoprolol   4-Hypertension: Continue with metoprolol   5-Hypokalemia: Replaced   Anemia: Monitor   Hyponatremia in setting of heart failure.  Normalized Chronic Leukopenia. Stable. Monitor . B 12 normal. Has history of cirrhosis.  Diabetes type 2; hyperglycemia. Uncontrolled. On  semglee to 25 units . Continue with SSI. Resume home regimen at discharge.  Moderate malnutrition; on supplement.  See wound care documentation below.  Pressure Injury 01/23/22 Coccyx Mid Stage 2 -  Partial thickness loss of dermis presenting as a shallow open injury with a red, pink wound bed without slough. (Active)  01/23/22 1915  Location: Coccyx  Location Orientation: Mid  Staging: Stage 2 -  Partial thickness loss of dermis presenting as a shallow open injury with a red, pink wound bed without slough.  Wound Description (Comments):   Present on Admission:         Nutrition Problem: Moderate Malnutrition Etiology: chronic illness  Consultants: Cardiology  Procedures performed: none Disposition: Skilled nursing facility Diet recommendation:  Discharge Diet Orders (From admission, onward)     Start     Ordered   08/11/22 0000  Diet - low sodium heart healthy        08/11/22 1142           Cardiac  diet DISCHARGE MEDICATION: Allergies as of 08/14/2022       Reactions   Morpholine Salicylate Other (See Comments)   Hallucinations   Penicillins Other (See Comments)   Allergic- reaction?? Has patient had a PCN reaction causing immediate rash, facial/tongue/throat swelling, SOB or lightheadedness with hypotension: Unk Has patient had a PCN reaction causing severe rash involving mucus membranes or skin necrosis: Unk Has patient had a PCN reaction that required hospitalization: Unk Has patient had a PCN reaction occurring within the last 10 years: No If all of the above answers are "NO", then may proceed with Cephalosporin use.   Morphine And Codeine Other (See Comments)   Hallucinations   Sglt2 Inhibitors Rash, Other (See Comments)   Candidiasis infection prone   Statins Rash, Other (See Comments)   Severe rash and back pain, and muscle pain        Medication List     STOP taking these medications    donepezil 5 MG tablet Commonly known as: ARICEPT   levofloxacin 500 MG tablet Commonly known as: LEVAQUIN   Praluent 75 MG/ML Soaj Generic drug: Alirocumab       TAKE these medications    Accu-Chek Guide test strip Generic drug: glucose blood Use as instructed   Accu-Chek Softclix Lancets lancets Use as instructed   acetaminophen 325 MG tablet Commonly known as: TYLENOL Take 2 tablets (650 mg total) by mouth every 4 (four) hours as needed for headache or mild pain.   busPIRone 7.5 MG tablet Commonly known as: BUSPAR Take 1 tablet (7.5 mg total) by mouth 2 (two) times daily.   clotrimazole 1 % cream Commonly known as: LOTRIMIN Apply topically.   cyanocobalamin 100 MCG tablet Commonly known as: VITAMIN B12 Take 100 mcg by mouth daily.   divalproex 250 MG DR tablet Commonly known as: DEPAKOTE Take 1 tablet (250 mg total) by mouth 2 (two) times daily. What changed:  medication strength how much to take   Eliquis 5 MG Tabs tablet Generic drug:  apixaban Take 1 tablet by mouth twice daily What changed: how much to take   escitalopram 10 MG tablet Commonly known as: LEXAPRO Take 1 tablet (10 mg total) by mouth daily.   fluconazole 150 MG tablet Commonly known as: DIFLUCAN Take 1 tablet (150 mg total) by mouth every three (3) days as needed (for scrotal fungal infection). What changed:  when to take this reasons to take this   furosemide 40 MG tablet Commonly known as: LASIX Take 1 tablet (40 mg total) by mouth 2 (two) times daily.   insulin glargine-yfgn 100 UNIT/ML injection Commonly known as: SEMGLEE Inject 0.26 mLs (26 Units total) into the skin at bedtime. What changed: how much to take   Insulin Pen Needle 31G X 5 MM Misc Use one pen needle three times daily. Dx E11.49   ipratropium-albuterol 0.5-2.5 (3) MG/3ML Soln Commonly known as: DUONEB Inhale 3 mLs into the lungs every 6 (six) hours as needed (for shortness of breath).   LORazepam 0.5 MG tablet Commonly known as: ATIVAN Take 1 tablet (0.5 mg total) by mouth every 6 (six) hours as needed for  anxiety.   metFORMIN 500 MG tablet Commonly known as: Glucophage Take 1 tablet (500 mg total) by mouth 2 (two) times daily with a meal.   metoprolol succinate 25 MG 24 hr tablet Commonly known as: TOPROL-XL Take 0.5 tablets (12.5 mg total) by mouth daily.   NovoLOG FlexPen 100 UNIT/ML FlexPen Generic drug: insulin aspart INJECT 5 UNITS UNDER THE SKIN THREE TIMES DAILY WITH MEALS What changed:  how much to take how to take this when to take this additional instructions   polyethylene glycol 17 g packet Commonly known as: MIRALAX / GLYCOLAX Take 17 g by mouth daily.   potassium chloride 10 MEQ tablet Commonly known as: KLOR-CON M Take 1 tablet (10 mEq total) by mouth daily. What changed:  medication strength how much to take how to take this when to take this additional instructions   QUEtiapine 25 MG tablet Commonly known as: SEROquel Take 3  tablets (75 mg total) by mouth at bedtime. What changed:  medication strength how much to take   Repatha 140 MG/ML Sosy Generic drug: Evolocumab Inject 140 mg into the skin. Every 14 days   spironolactone 25 MG tablet Commonly known as: ALDACTONE Take 0.5 tablets (12.5 mg total) by mouth daily.   thiamine 100 MG tablet Commonly known as: Vitamin B-1 Take 1 tablet (100 mg total) by mouth daily.   traZODone 50 MG tablet Commonly known as: DESYREL Take 0.5 tablets (25 mg total) by mouth at bedtime as needed for sleep.   valACYclovir 1000 MG tablet Commonly known as: VALTREX Take 1 tablet (1,000 mg total) by mouth 2 (two) times daily as needed (if having herpes outbreak). What changed:  when to take this reasons to take this        Follow-up Information     Dyann Kief, PA-C Follow up.   Specialty: Cardiology Why: [facility] - [address] location - cardiology follow-up arranged Tuesday August 15, 2022 at 10:45 AM (Arrive by 10:30 AM). Elon Jester is one of our PAs that works with Dr. Clifton James. Contact information: [address] [place] [address] [phone]                Discharge Exam: Filed Weights   08/11/22 0424 08/13/22 0426 08/14/22 0647  Weight: 78.2 kg 78.3 kg 79 kg   General; NAD  Condition at discharge: stable  The results of significant diagnostics from this hospitalization (including imaging, microbiology, ancillary and laboratory) are listed below for reference.   Imaging Studies: DG CHEST PORT 1 VIEW  Result Date: 08/09/2022 CLINICAL DATA:  Shortness of breath EXAM: PORTABLE CHEST 1 VIEW COMPARISON:  Chest x-ray 08/02/2022 FINDINGS: Heart is enlarged. Patient is status post valve replacement. There is a small left pleural effusion. There some hazy and patchy opacities in the retrocardiac region similar to prior. Right lung is clear. No pneumothorax or acute fracture. IMPRESSION: 1. Cardiomegaly. 2. Small left pleural  effusion. 3. Hazy and patchy opacities in the retrocardiac region similar to prior. Electronically Signed   By: Darliss Cheney M.D.   On: 08/09/2022 14:35   DG Chest Port 1 View  Result Date: 08/02/2022 CLINICAL DATA:  Shortness of breath. EXAM: PORTABLE CHEST 1 VIEW COMPARISON:  08/01/2022 and CT chest 02/27/2022. FINDINGS: Trachea is midline. Heart is enlarged. Left atrial appendage clip. Aortic valve replacement. There may be retrocardiac opacification. Difficult to exclude a small left pleural effusion. No interstitial prominence and indistinctness. IMPRESSION: 1. Suspect developing left lower lobe airspace  opacification which may be due to atelectasis or pneumonia. 2. Small left pleural effusion. Electronically Signed   By: Leanna Battles M.D.   On: 08/02/2022 14:40   DG Chest 2 View  Result Date: 08/01/2022 CLINICAL DATA:  CHF.  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 Stable enlarged heart with some vascular congestion. Small left effusion. No pneumothorax or consolidation. Film is under penetrated. IMPRESSION: Enlarged heart with vascular congestion. Prosthetic valve and atrial occlusion clip. Small left effusion. Electronically Signed   By: Karen Kays M.D.   On: 08/01/2022 19:08   DG Pelvis Portable  Result Date: 07/29/2022 CLINICAL DATA:  72 year old male status post fall. EXAM: PORTABLE PELVIS 1-2 VIEWS COMPARISON:  CT Abdomen and Pelvis 01/17/2022. FINDINGS: 2 AP views of the pelvis at 0357 hours. Numerous chronic pelvic phleboliths incidentally noted. Femoral heads normally located. Bone mineralization is within normal limits. No pelvis fracture identified. SI joints appear symmetric, pubic symphysis within normal limits. Grossly intact proximal femurs. Aortoiliac calcified atherosclerosis. Negative visible bowel gas. IMPRESSION: 1. No acute fracture or dislocation identified about the  pelvis. 2.  Aortic Atherosclerosis (ICD10-I70.0). Electronically Signed   By: Odessa Fleming M.D.   On: 07/29/2022 04:10   CT Head Wo Contrast  Result Date: 07/29/2022 CLINICAL DATA:  Altered mental status. EXAM: CT HEAD WITHOUT CONTRAST TECHNIQUE: Contiguous axial images were obtained from the base of the skull through the vertex without intravenous contrast. RADIATION DOSE REDUCTION: This exam was performed according to the departmental dose-optimization program which includes automated exposure control, adjustment of the mA and/or kV according to patient size and/or use of iterative reconstruction technique. COMPARISON:  January 17, 2022 FINDINGS: Brain: There is mild cerebral atrophy with widening of the extra-axial spaces and ventricular dilatation. There are areas of decreased attenuation within the white matter tracts of the supratentorial brain, consistent with microvascular disease changes. A small, chronic left occipital lobe infarct is seen. Vascular: No hyperdense vessel or unexpected calcification. Skull: Normal. Negative for fracture or focal lesion. Sinuses/Orbits: Stable postoperative changes are seen involving the left globe. Other: None. IMPRESSION: 1. No acute intracranial abnormality. 2. Small, chronic left occipital lobe infarct. 3. Generalized cerebral atrophy with widening of the extra-axial spaces and ventricular dilatation. Electronically Signed   By: Aram Candela M.D.   On: 07/29/2022 00:20   DG Chest Port 1 View  Result Date: 07/28/2022 CLINICAL DATA:  Shortness of breath. EXAM: PORTABLE CHEST 1 VIEW COMPARISON:  March 02, 2022 FINDINGS: The cardiac silhouette is mildly enlarged and unchanged in size. Radiopaque atrial appendage clip and artificial aortic valve are seen. There is moderate to marked severity calcification of the thoracic aorta. Mild, diffusely increased lung markings are seen with mild prominence of the perihilar pulmonary vasculature. This is more prominent on the  left and may be positional in origin. No pleural effusion or pneumothorax is identified. Degenerative changes seen throughout the thoracic spine. IMPRESSION: 1. Cardiomegaly with findings suggestive of mild pulmonary vascular congestion. Electronically Signed   By: Aram Candela M.D.   On: 07/28/2022 23:43    Microbiology: Results for orders placed or performed during the hospital encounter of 08/02/22  MRSA Next Gen by PCR, Nasal     Status: Abnormal   Collection Time: 08/02/22  4:19 PM   Specimen: Nasal Mucosa; Nasal Swab  Result Value Ref Range Status   MRSA by PCR Next Gen DETECTED (A) NOT DETECTED Final  Comment: (NOTE) The GeneXpert MRSA Assay (FDA approved for NASAL specimens only), is one component of a comprehensive MRSA colonization surveillance program. It is not intended to diagnose MRSA infection nor to guide or monitor treatment for MRSA infections. Test performance is not FDA approved in patients less than 40 years old. Performed at [facility], [address], [place], [place] [address]     Labs: CBC: Recent Labs  Lab 08/08/22 0344 08/09/22 0406 08/13/22 0326  WBC 2.1* 2.9* 2.3*  HGB 9.7* 9.7* 8.7*  HCT 30.0* 29.4* 27.0*  MCV 101.4* 99.7 100.4*  PLT 210 192 180   Basic Metabolic Panel: Recent Labs  Lab 08/08/22 0344 08/09/22 0406 08/10/22 0418 08/10/22 1030 08/11/22 0422 08/11/22 1817 08/12/22 0834  NA 134* 134* 137  --  136  --  138  K 3.8 3.8 3.8  --  3.8  --  4.1  CL 98 97* 103  --  102  --  104  CO2 28 28 26   --  26  --  25  GLUCOSE 322* 275* 194*  --  274*  --  145*  BUN 25* 25* 28*  --  33*  --  32*  CREATININE 0.89 0.90 0.93  --  0.87  --  1.05  CALCIUM 8.3* 8.4* 8.4*  --  8.4*  --  8.8*  MG  --   --   --  1.9  --  1.7 2.2   Liver Function Tests: No results for input(s): "AST", "ALT", "ALKPHOS", "BILITOT", "PROT", "ALBUMIN" in the last 168 hours. CBG: Recent Labs  Lab 08/13/22 0729 08/13/22 1120 08/13/22 1703  08/13/22 2036 08/14/22 0725  GLUCAP 238* 199* 121* 213* 116*    Discharge time spent: greater than 30 minutes.  Signed: Alba Cory, MD [facility] 08/14/2022

## 2022-08-14 NOTE — TOC Progression Note (Addendum)
Transition of Care [facility]) - Progression Note    Patient Details  Name: Mark Cabrera MRN: [id number] Date of Birth: [birth year]  [facility] CM/SW Contact  Howell Rucks, RN Phone Number: 08/14/2022, 9:59 AM  Clinical Narrative:  Call to [facility] in admissions at [facility] to confirm bed availability for potential  transfer today, left vm with NCM name and phone number requesting call back.     -10:02am Call received from [facility] with [facility], confirmed bed available today for discharge if pt medically stable.       Barriers to Discharge: No Barriers Identified  Expected Discharge Plan and Services         Expected Discharge Date: 08/11/22                                     Social Determinants of Health (SDOH) Interventions SDOH Screenings   Food Insecurity: No Food Insecurity (08/02/2022)  Housing: Low Risk  (08/02/2022)  Transportation Needs: No Transportation Needs (08/02/2022)  Utilities: Not At Risk (08/02/2022)  Alcohol Screen: Low Risk  (01/18/2022)  Depression (PHQ2-9): Low Risk  (01/18/2022)  Financial Resource Strain: Medium Risk (01/18/2022)  Physical Activity: Inactive (01/18/2022)  Social Connections: Moderately Isolated (01/18/2022)  Stress: Stress Concern Present (01/18/2022)  Tobacco Use: Low Risk  (08/02/2022)    Readmission Risk Interventions     No data to display

## 2022-08-15 ENCOUNTER — Ambulatory Visit: Payer: Medicare Other | Admitting: Physician Assistant

## 2022-08-15 ENCOUNTER — Encounter: Payer: Self-pay | Admitting: *Deleted

## 2022-08-15 DIAGNOSIS — Z79899 Other long term (current) drug therapy: Secondary | ICD-10-CM | POA: Diagnosis not present

## 2022-08-15 DIAGNOSIS — F03918 Unspecified dementia, unspecified severity, with other behavioral disturbance: Secondary | ICD-10-CM | POA: Diagnosis not present

## 2022-08-15 DIAGNOSIS — I5043 Acute on chronic combined systolic (congestive) and diastolic (congestive) heart failure: Secondary | ICD-10-CM | POA: Diagnosis not present

## 2022-08-15 DIAGNOSIS — R456 Violent behavior: Secondary | ICD-10-CM | POA: Diagnosis not present

## 2022-08-16 DIAGNOSIS — I5043 Acute on chronic combined systolic (congestive) and diastolic (congestive) heart failure: Secondary | ICD-10-CM | POA: Diagnosis not present

## 2022-08-16 DIAGNOSIS — E46 Unspecified protein-calorie malnutrition: Secondary | ICD-10-CM | POA: Diagnosis not present

## 2022-08-16 DIAGNOSIS — F03918 Unspecified dementia, unspecified severity, with other behavioral disturbance: Secondary | ICD-10-CM | POA: Diagnosis not present

## 2022-08-16 DIAGNOSIS — I480 Paroxysmal atrial fibrillation: Secondary | ICD-10-CM | POA: Diagnosis not present

## 2022-08-16 DIAGNOSIS — D649 Anemia, unspecified: Secondary | ICD-10-CM | POA: Diagnosis not present

## 2022-08-16 DIAGNOSIS — E119 Type 2 diabetes mellitus without complications: Secondary | ICD-10-CM | POA: Diagnosis not present

## 2022-08-16 DIAGNOSIS — R456 Violent behavior: Secondary | ICD-10-CM | POA: Diagnosis not present

## 2022-08-16 DIAGNOSIS — D72819 Decreased white blood cell count, unspecified: Secondary | ICD-10-CM | POA: Diagnosis not present

## 2022-08-16 DIAGNOSIS — E785 Hyperlipidemia, unspecified: Secondary | ICD-10-CM | POA: Diagnosis not present

## 2022-08-16 DIAGNOSIS — I110 Hypertensive heart disease with heart failure: Secondary | ICD-10-CM | POA: Diagnosis not present

## 2022-08-17 DIAGNOSIS — I110 Hypertensive heart disease with heart failure: Secondary | ICD-10-CM | POA: Diagnosis not present

## 2022-08-17 DIAGNOSIS — F03918 Unspecified dementia, unspecified severity, with other behavioral disturbance: Secondary | ICD-10-CM | POA: Diagnosis not present

## 2022-08-17 DIAGNOSIS — Z79899 Other long term (current) drug therapy: Secondary | ICD-10-CM | POA: Diagnosis not present

## 2022-08-17 DIAGNOSIS — R456 Violent behavior: Secondary | ICD-10-CM | POA: Diagnosis not present

## 2022-08-17 DIAGNOSIS — Z7189 Other specified counseling: Secondary | ICD-10-CM | POA: Diagnosis not present

## 2022-08-21 DIAGNOSIS — E119 Type 2 diabetes mellitus without complications: Secondary | ICD-10-CM | POA: Diagnosis not present

## 2022-08-21 DIAGNOSIS — I480 Paroxysmal atrial fibrillation: Secondary | ICD-10-CM | POA: Diagnosis not present

## 2022-08-21 DIAGNOSIS — E46 Unspecified protein-calorie malnutrition: Secondary | ICD-10-CM | POA: Diagnosis not present

## 2022-08-21 DIAGNOSIS — Z79899 Other long term (current) drug therapy: Secondary | ICD-10-CM | POA: Diagnosis not present

## 2022-08-21 DIAGNOSIS — Z794 Long term (current) use of insulin: Secondary | ICD-10-CM | POA: Diagnosis not present

## 2022-09-01 DIAGNOSIS — E559 Vitamin D deficiency, unspecified: Secondary | ICD-10-CM | POA: Diagnosis not present

## 2022-09-01 DIAGNOSIS — R5383 Other fatigue: Secondary | ICD-10-CM | POA: Diagnosis not present

## 2022-09-01 DIAGNOSIS — R5381 Other malaise: Secondary | ICD-10-CM | POA: Diagnosis not present

## 2022-09-01 DIAGNOSIS — D649 Anemia, unspecified: Secondary | ICD-10-CM | POA: Diagnosis not present

## 2022-09-01 NOTE — Progress Notes (Unsigned)
Cardiology Office Note:    Date:  09/05/2022  ID:  Mark Cabrera, DOB August 15, 1950, MRN [id number] PCP: Evlyn Kanner, MD  [facility] Providers Cardiologist:  Verne Carrow, MD       Patient Profile:      Coronary artery disease  S/p CABG (+AVR) in 2018 [S-PDA, S-OM1] C/b pericardial effusion >> s/p window Chest pain >> sternal wire removal in 6/19 S/p NSTEMI >> PCI: DES to mLAD and DES to S-PDA at [facility] in 12/2018 [facility] 01/10/2019 [facility], [place], [place]): SVG-OM 100, LAD CTO, SVG-PDA severe stenosis Aortic stenosis S/p bioprosthetic AVR in 2018 (at time of CABG) Paroxysmal atrial fibrillation  S/p LAA clipping at time of CABG Apixaban  (HFrEF) heart failure with reduced ejection fraction  TTE 09/22/2021: EF 50-55 Admx 02/2022 - s/p R and L thoracentesis  TTE 02/28/2022: EF 30-35, moderately reduced RVSF, mildly elevated PASP, RVSP 38.2, mild-moderate LAE, moderate MR, RAP 15, Apical septal and apical HK, AVR ok  Med Rx planned due to dementia, comorbid illnesses  Hx of CVA in 2009; embolic CVA in 06/2018 Carotid US 1/18: Bilateral ICA 1-39 Hypertension  Hyperlipidemia  Diabetes mellitus  OSA Cirrhosis  Dementia  Lives in [facility]        History of Present Illness:   Mark Cabrera is a 72 y.o. male who returns for post hospital follow up. He has had several admissions to the hospital in the last several months for acute on chronic CHF, altered mental status.  He was admitted 5/22-6/3 with decompensated heart failure.  He was treated with IV Lasix.  He was -10 L at discharge.  Weight at discharge was 174. He is currently staying at [facility]. He is here with the nurse's aid. He denies chest pain, syncope, edema. He notes shortness of breath at night (?orthopnea).   Review of Systems  Gastrointestinal:  Negative for hematochezia.   See HPI    Studies Reviewed:        Risk Assessment/Calculations:    CHA2DS2-VASc  Score = 7   This indicates a 11.2% annual risk of stroke. The patient's score is based upon: CHF History: 1 HTN History: 1 Diabetes History: 1 Stroke History: 2 Vascular Disease History: 1 Age Score: 1 Gender Score: 0         Physical Exam:   VS:  BP 103/60   Pulse 88   Ht 5\' 8"  (1.727 m)   Wt 170 lb 9.6 oz (77.4 kg)   SpO2 96%   BMI 25.94 kg/m    Wt Readings from Last 3 Encounters:  09/05/22 170 lb 9.6 oz (77.4 kg)  08/14/22 174 lb 2.6 oz (79 kg)  08/01/22 187 lb 13.3 oz (85.2 kg)    Constitutional:      Appearance: Not in distress.  Neck:     Vascular: No JVR.  Pulmonary:     Breath sounds: Normal breath sounds. No wheezing. No rales.  Cardiovascular:     Normal rate. Irregularly irregular rhythm.     Murmurs: There is no murmur.  Edema:    Peripheral edema absent.  Abdominal:     Palpations: Abdomen is soft.      ASSESSMENT AND PLAN:   HFrEF (heart failure with reduced ejection fraction) (HCC) EF 30-35.  NYHA II.  Volume status stable.  It has been decided to manage the patient medically due to dementia and multiple comorbid illnesses.  Low blood pressure has limited GDMT.  Continue furosemide 40 mg twice daily, Toprol-XL 12.5 mg daily, spironolactone 12.5 mg daily, K+ 10 mEq daily.  At this point, I do not think his BP will tolerate an ACE or ARB. This can be considered again at follow up. Obtain follow-up BMET today.  Follow-up 6 months.  Paroxysmal atrial fibrillation (HCC) He has remained in atrial fibrillation.  Rate is controlled.  He does have a history of anemia.  Based upon age, creatinine, weight, continue Eliquis 5 mg twice daily.  Continue Toprol-XL 12.5 mg daily.  Obtain follow-up CBC today, BMET.  CAD (coronary artery disease), native coronary artery History of CABG in 2018.  He is not having chest pain to suggest angina.  He is not on antiplatelet therapy due to being on Eliquis.  Continue Repatha 140 mg every 14 days.  Aortic stenosis S/p AVR in  2018.  Echo in December 2023 with normally functioning valve.  Continue SBE prophylaxis.    Dispo:  Return in about 6 months (around 03/07/2023) for Routine Follow Up w Dr. Clifton James.  Signed, Tereso Newcomer, PA-C

## 2022-09-05 ENCOUNTER — Ambulatory Visit: Payer: Medicare Other | Attending: Physician Assistant | Admitting: Physician Assistant

## 2022-09-05 ENCOUNTER — Encounter: Payer: Self-pay | Admitting: Physician Assistant

## 2022-09-05 VITALS — BP 103/60 | HR 88 | Ht 68.0 in | Wt 170.6 lb

## 2022-09-05 DIAGNOSIS — I5020 Unspecified systolic (congestive) heart failure: Secondary | ICD-10-CM | POA: Diagnosis not present

## 2022-09-05 DIAGNOSIS — I2510 Atherosclerotic heart disease of native coronary artery without angina pectoris: Secondary | ICD-10-CM

## 2022-09-05 DIAGNOSIS — I350 Nonrheumatic aortic (valve) stenosis: Secondary | ICD-10-CM | POA: Diagnosis not present

## 2022-09-05 DIAGNOSIS — I480 Paroxysmal atrial fibrillation: Secondary | ICD-10-CM | POA: Diagnosis not present

## 2022-09-05 NOTE — Patient Instructions (Signed)
Medication Instructions:  Your physician recommends that you continue on your current medications as directed. Please refer to the Current Medication list given to you today.  *If you need a refill on your cardiac medications before your next appointment, please call your pharmacy*   Lab Work: TODAY:  BMET & CBC  If you have labs (blood work) drawn today and your tests are completely normal, you will receive your results only by: MyChart Message (if you have MyChart) OR A paper copy in the mail If you have any lab test that is abnormal or we need to change your treatment, we will call you to review the results.   Testing/Procedures: None ordered   Follow-Up: At [facility], you and your health needs are our priority.  As part of our continuing mission to provide you with exceptional heart care, we have created designated Provider Care Teams.  These Care Teams include your primary Cardiologist (physician) and Advanced Practice Providers (APPs -  Physician Assistants and Nurse Practitioners) who all work together to provide you with the care you need, when you need it.  We recommend signing up for the patient portal called "MyChart".  Sign up information is provided on this After Visit Summary.  MyChart is used to connect with patients for Virtual Visits (Telemedicine).  Patients are able to view lab/test results, encounter notes, upcoming appointments, etc.  Non-urgent messages can be sent to your provider as well.   To learn more about what you can do with MyChart, go to [email or web address].    Your next appointment:   6 month(s)  Provider:   Verne Carrow, MD     Other Instructions

## 2022-09-05 NOTE — Assessment & Plan Note (Signed)
S/p AVR in 2018.  Echo in December 2023 with normally functioning valve.  Continue SBE prophylaxis.

## 2022-09-05 NOTE — Assessment & Plan Note (Addendum)
EF 30-35.  NYHA II.  Volume status stable.  It has been decided to manage the patient medically due to dementia and multiple comorbid illnesses.  Low blood pressure has limited GDMT.  Continue furosemide 40 mg twice daily, Toprol-XL 12.5 mg daily, spironolactone 12.5 mg daily, K+ 10 mEq daily.  At this point, I do not think his BP will tolerate an ACE or ARB. This can be considered again at follow up. Obtain follow-up BMET today.  Follow-up 6 months.

## 2022-09-05 NOTE — Assessment & Plan Note (Signed)
He has remained in atrial fibrillation.  Rate is controlled.  He does have a history of anemia.  Based upon age, creatinine, weight, continue Eliquis 5 mg twice daily.  Continue Toprol-XL 12.5 mg daily.  Obtain follow-up CBC today, BMET.

## 2022-09-05 NOTE — Assessment & Plan Note (Signed)
History of CABG in 2018.  He is not having chest pain to suggest angina.  He is not on antiplatelet therapy due to being on Eliquis.  Continue Repatha 140 mg every 14 days.

## 2022-09-06 LAB — CBC
Hematocrit: 30 % — ABNORMAL LOW (ref 37.5–51.0)
Hemoglobin: 9.8 g/dL — ABNORMAL LOW (ref 13.0–17.7)
MCH: 31 pg (ref 26.6–33.0)
MCHC: 32.7 g/dL (ref 31.5–35.7)
MCV: 95 fL (ref 79–97)
Platelets: 296 10*3/uL (ref 150–450)
RBC: 3.16 x10E6/uL — ABNORMAL LOW (ref 4.14–5.80)
RDW: 14.8 % (ref 11.6–15.4)
WBC: 2.6 10*3/uL — ABNORMAL LOW (ref 3.4–10.8)

## 2022-09-06 LAB — BASIC METABOLIC PANEL
BUN/Creatinine Ratio: 27 — ABNORMAL HIGH (ref 10–24)
BUN: 28 mg/dL — ABNORMAL HIGH (ref 8–27)
CO2: 23 mmol/L (ref 20–29)
Calcium: 9 mg/dL (ref 8.6–10.2)
Chloride: 101 mmol/L (ref 96–106)
Creatinine, Ser: 1.05 mg/dL (ref 0.76–1.27)
Glucose: 152 mg/dL — ABNORMAL HIGH (ref 70–99)
Potassium: 3.6 mmol/L (ref 3.5–5.2)
Sodium: 139 mmol/L (ref 134–144)
eGFR: 75 mL/min/{1.73_m2} (ref >59)

## 2022-09-12 DIAGNOSIS — F419 Anxiety disorder, unspecified: Secondary | ICD-10-CM | POA: Diagnosis not present

## 2022-09-13 DIAGNOSIS — F02C2 Dementia in other diseases classified elsewhere, severe, with psychotic disturbance: Secondary | ICD-10-CM | POA: Diagnosis not present

## 2022-09-13 DIAGNOSIS — F3132 Bipolar disorder, current episode depressed, moderate: Secondary | ICD-10-CM | POA: Diagnosis not present

## 2022-09-21 DIAGNOSIS — F02C2 Dementia in other diseases classified elsewhere, severe, with psychotic disturbance: Secondary | ICD-10-CM | POA: Diagnosis not present

## 2022-09-21 DIAGNOSIS — F3132 Bipolar disorder, current episode depressed, moderate: Secondary | ICD-10-CM | POA: Diagnosis not present

## 2022-10-02 DIAGNOSIS — E559 Vitamin D deficiency, unspecified: Secondary | ICD-10-CM | POA: Diagnosis not present

## 2022-10-02 DIAGNOSIS — D649 Anemia, unspecified: Secondary | ICD-10-CM | POA: Diagnosis not present

## 2022-10-03 DIAGNOSIS — M25522 Pain in left elbow: Secondary | ICD-10-CM | POA: Diagnosis not present

## 2022-10-03 DIAGNOSIS — M25552 Pain in left hip: Secondary | ICD-10-CM | POA: Diagnosis not present

## 2022-10-06 DIAGNOSIS — I5043 Acute on chronic combined systolic (congestive) and diastolic (congestive) heart failure: Secondary | ICD-10-CM | POA: Diagnosis not present

## 2022-10-06 DIAGNOSIS — M6281 Muscle weakness (generalized): Secondary | ICD-10-CM | POA: Diagnosis not present

## 2022-10-06 DIAGNOSIS — R262 Difficulty in walking, not elsewhere classified: Secondary | ICD-10-CM | POA: Diagnosis not present

## 2022-10-06 DIAGNOSIS — R296 Repeated falls: Secondary | ICD-10-CM | POA: Diagnosis not present

## 2022-10-09 ENCOUNTER — Emergency Department (HOSPITAL_COMMUNITY): Payer: Medicare Other

## 2022-10-09 ENCOUNTER — Other Ambulatory Visit: Payer: Self-pay

## 2022-10-09 ENCOUNTER — Inpatient Hospital Stay (HOSPITAL_COMMUNITY)
Admission: EM | Admit: 2022-10-09 | Discharge: 2022-11-12 | DRG: 641 | Disposition: E | Payer: Medicare Other | Source: Skilled Nursing Facility | Attending: Family Medicine | Admitting: Family Medicine

## 2022-10-09 ENCOUNTER — Encounter (HOSPITAL_COMMUNITY): Payer: Self-pay

## 2022-10-09 DIAGNOSIS — Z7984 Long term (current) use of oral hypoglycemic drugs: Secondary | ICD-10-CM

## 2022-10-09 DIAGNOSIS — Z9181 History of falling: Secondary | ICD-10-CM | POA: Diagnosis not present

## 2022-10-09 DIAGNOSIS — Z66 Do not resuscitate: Secondary | ICD-10-CM | POA: Diagnosis present

## 2022-10-09 DIAGNOSIS — Z888 Allergy status to other drugs, medicaments and biological substances status: Secondary | ICD-10-CM

## 2022-10-09 DIAGNOSIS — R079 Chest pain, unspecified: Secondary | ICD-10-CM | POA: Diagnosis not present

## 2022-10-09 DIAGNOSIS — R6510 Systemic inflammatory response syndrome (SIRS) of non-infectious origin without acute organ dysfunction: Secondary | ICD-10-CM | POA: Insufficient documentation

## 2022-10-09 DIAGNOSIS — E872 Acidosis, unspecified: Secondary | ICD-10-CM | POA: Diagnosis present

## 2022-10-09 DIAGNOSIS — N179 Acute kidney failure, unspecified: Secondary | ICD-10-CM | POA: Diagnosis present

## 2022-10-09 DIAGNOSIS — R0989 Other specified symptoms and signs involving the circulatory and respiratory systems: Secondary | ICD-10-CM | POA: Diagnosis not present

## 2022-10-09 DIAGNOSIS — I5020 Unspecified systolic (congestive) heart failure: Secondary | ICD-10-CM | POA: Diagnosis present

## 2022-10-09 DIAGNOSIS — I2510 Atherosclerotic heart disease of native coronary artery without angina pectoris: Secondary | ICD-10-CM | POA: Diagnosis not present

## 2022-10-09 DIAGNOSIS — I700 Atherosclerosis of aorta: Secondary | ICD-10-CM | POA: Diagnosis not present

## 2022-10-09 DIAGNOSIS — Z79899 Other long term (current) drug therapy: Secondary | ICD-10-CM

## 2022-10-09 DIAGNOSIS — I110 Hypertensive heart disease with heart failure: Secondary | ICD-10-CM | POA: Diagnosis not present

## 2022-10-09 DIAGNOSIS — Z794 Long term (current) use of insulin: Secondary | ICD-10-CM

## 2022-10-09 DIAGNOSIS — I480 Paroxysmal atrial fibrillation: Secondary | ICD-10-CM | POA: Diagnosis present

## 2022-10-09 DIAGNOSIS — I5042 Chronic combined systolic (congestive) and diastolic (congestive) heart failure: Secondary | ICD-10-CM | POA: Diagnosis present

## 2022-10-09 DIAGNOSIS — E785 Hyperlipidemia, unspecified: Secondary | ICD-10-CM | POA: Diagnosis not present

## 2022-10-09 DIAGNOSIS — I255 Ischemic cardiomyopathy: Secondary | ICD-10-CM | POA: Diagnosis not present

## 2022-10-09 DIAGNOSIS — S2242XA Multiple fractures of ribs, left side, initial encounter for closed fracture: Secondary | ICD-10-CM | POA: Diagnosis present

## 2022-10-09 DIAGNOSIS — M5030 Other cervical disc degeneration, unspecified cervical region: Secondary | ICD-10-CM | POA: Diagnosis not present

## 2022-10-09 DIAGNOSIS — E860 Dehydration: Principal | ICD-10-CM | POA: Insufficient documentation

## 2022-10-09 DIAGNOSIS — E1149 Type 2 diabetes mellitus with other diabetic neurological complication: Secondary | ICD-10-CM | POA: Diagnosis not present

## 2022-10-09 DIAGNOSIS — J90 Pleural effusion, not elsewhere classified: Secondary | ICD-10-CM | POA: Diagnosis not present

## 2022-10-09 DIAGNOSIS — G9389 Other specified disorders of brain: Secondary | ICD-10-CM | POA: Diagnosis not present

## 2022-10-09 DIAGNOSIS — S0990XA Unspecified injury of head, initial encounter: Secondary | ICD-10-CM | POA: Diagnosis not present

## 2022-10-09 DIAGNOSIS — Z515 Encounter for palliative care: Secondary | ICD-10-CM

## 2022-10-09 DIAGNOSIS — R680 Hypothermia, not associated with low environmental temperature: Secondary | ICD-10-CM | POA: Diagnosis present

## 2022-10-09 DIAGNOSIS — R918 Other nonspecific abnormal finding of lung field: Secondary | ICD-10-CM | POA: Diagnosis not present

## 2022-10-09 DIAGNOSIS — Z5986 Financial insecurity: Secondary | ICD-10-CM

## 2022-10-09 DIAGNOSIS — Z1152 Encounter for screening for COVID-19: Secondary | ICD-10-CM

## 2022-10-09 DIAGNOSIS — G4731 Primary central sleep apnea: Secondary | ICD-10-CM | POA: Diagnosis not present

## 2022-10-09 DIAGNOSIS — M1710 Unilateral primary osteoarthritis, unspecified knee: Secondary | ICD-10-CM | POA: Diagnosis present

## 2022-10-09 DIAGNOSIS — Z885 Allergy status to narcotic agent status: Secondary | ICD-10-CM

## 2022-10-09 DIAGNOSIS — K219 Gastro-esophageal reflux disease without esophagitis: Secondary | ICD-10-CM | POA: Diagnosis present

## 2022-10-09 DIAGNOSIS — R040 Epistaxis: Secondary | ICD-10-CM | POA: Diagnosis present

## 2022-10-09 DIAGNOSIS — F03918 Unspecified dementia, unspecified severity, with other behavioral disturbance: Secondary | ICD-10-CM | POA: Diagnosis not present

## 2022-10-09 DIAGNOSIS — R531 Weakness: Secondary | ICD-10-CM | POA: Diagnosis not present

## 2022-10-09 DIAGNOSIS — W19XXXA Unspecified fall, initial encounter: Secondary | ICD-10-CM | POA: Diagnosis not present

## 2022-10-09 DIAGNOSIS — M549 Dorsalgia, unspecified: Secondary | ICD-10-CM | POA: Diagnosis not present

## 2022-10-09 DIAGNOSIS — Z952 Presence of prosthetic heart valve: Secondary | ICD-10-CM | POA: Diagnosis not present

## 2022-10-09 DIAGNOSIS — I10 Essential (primary) hypertension: Secondary | ICD-10-CM | POA: Diagnosis not present

## 2022-10-09 DIAGNOSIS — T68XXXD Hypothermia, subsequent encounter: Secondary | ICD-10-CM | POA: Diagnosis not present

## 2022-10-09 DIAGNOSIS — M25559 Pain in unspecified hip: Secondary | ICD-10-CM | POA: Diagnosis not present

## 2022-10-09 DIAGNOSIS — E1140 Type 2 diabetes mellitus with diabetic neuropathy, unspecified: Secondary | ICD-10-CM | POA: Diagnosis not present

## 2022-10-09 DIAGNOSIS — Z8249 Family history of ischemic heart disease and other diseases of the circulatory system: Secondary | ICD-10-CM

## 2022-10-09 DIAGNOSIS — Z7901 Long term (current) use of anticoagulants: Secondary | ICD-10-CM

## 2022-10-09 DIAGNOSIS — Z87442 Personal history of urinary calculi: Secondary | ICD-10-CM

## 2022-10-09 DIAGNOSIS — R627 Adult failure to thrive: Secondary | ICD-10-CM | POA: Diagnosis present

## 2022-10-09 DIAGNOSIS — Z951 Presence of aortocoronary bypass graft: Secondary | ICD-10-CM | POA: Diagnosis not present

## 2022-10-09 DIAGNOSIS — Z043 Encounter for examination and observation following other accident: Secondary | ICD-10-CM | POA: Diagnosis not present

## 2022-10-09 DIAGNOSIS — Z7189 Other specified counseling: Secondary | ICD-10-CM | POA: Diagnosis not present

## 2022-10-09 DIAGNOSIS — I6782 Cerebral ischemia: Secondary | ICD-10-CM | POA: Diagnosis not present

## 2022-10-09 DIAGNOSIS — S2249XA Multiple fractures of ribs, unspecified side, initial encounter for closed fracture: Secondary | ICD-10-CM | POA: Insufficient documentation

## 2022-10-09 DIAGNOSIS — Z811 Family history of alcohol abuse and dependence: Secondary | ICD-10-CM

## 2022-10-09 DIAGNOSIS — T68XXXA Hypothermia, initial encounter: Secondary | ICD-10-CM | POA: Diagnosis present

## 2022-10-09 DIAGNOSIS — Z833 Family history of diabetes mellitus: Secondary | ICD-10-CM

## 2022-10-09 DIAGNOSIS — R52 Pain, unspecified: Secondary | ICD-10-CM | POA: Diagnosis not present

## 2022-10-09 DIAGNOSIS — Z8673 Personal history of transient ischemic attack (TIA), and cerebral infarction without residual deficits: Secondary | ICD-10-CM

## 2022-10-09 DIAGNOSIS — S2242XD Multiple fractures of ribs, left side, subsequent encounter for fracture with routine healing: Secondary | ICD-10-CM | POA: Diagnosis not present

## 2022-10-09 DIAGNOSIS — M160 Bilateral primary osteoarthritis of hip: Secondary | ICD-10-CM | POA: Diagnosis not present

## 2022-10-09 DIAGNOSIS — K7460 Unspecified cirrhosis of liver: Secondary | ICD-10-CM | POA: Diagnosis present

## 2022-10-09 DIAGNOSIS — M47812 Spondylosis without myelopathy or radiculopathy, cervical region: Secondary | ICD-10-CM | POA: Diagnosis not present

## 2022-10-09 DIAGNOSIS — Z880 Allergy status to penicillin: Secondary | ICD-10-CM

## 2022-10-09 LAB — PROTIME-INR
INR: 1.7 — ABNORMAL HIGH (ref 0.8–1.2)
Prothrombin Time: 20.6 seconds — ABNORMAL HIGH (ref 11.4–15.2)

## 2022-10-09 LAB — COMPREHENSIVE METABOLIC PANEL
ALT: 44 U/L (ref 0–44)
AST: 58 U/L — ABNORMAL HIGH (ref 15–41)
Albumin: 3.4 g/dL — ABNORMAL LOW (ref 3.5–5.0)
Alkaline Phosphatase: 88 U/L (ref 38–126)
Anion gap: 14 (ref 5–15)
BUN: 73 mg/dL — ABNORMAL HIGH (ref 8–23)
CO2: 20 mmol/L — ABNORMAL LOW (ref 22–32)
Calcium: 8.4 mg/dL — ABNORMAL LOW (ref 8.9–10.3)
Chloride: 98 mmol/L (ref 98–111)
Creatinine, Ser: 1.53 mg/dL — ABNORMAL HIGH (ref 0.61–1.24)
GFR, Estimated: 47 mL/min — ABNORMAL LOW (ref >60)
Glucose, Bld: 191 mg/dL — ABNORMAL HIGH (ref 70–99)
Potassium: 3.1 mmol/L — ABNORMAL LOW (ref 3.5–5.1)
Sodium: 132 mmol/L — ABNORMAL LOW (ref 135–145)
Total Bilirubin: 2.6 mg/dL — ABNORMAL HIGH (ref 0.3–1.2)
Total Protein: 8 g/dL (ref 6.5–8.1)

## 2022-10-09 LAB — GLUCOSE, CAPILLARY: Glucose-Capillary: 153 mg/dL — ABNORMAL HIGH (ref 70–99)

## 2022-10-09 LAB — LACTIC ACID, PLASMA
Lactic Acid, Venous: 2.1 mmol/L (ref 0.5–1.9)
Lactic Acid, Venous: 2.4 mmol/L (ref 0.5–1.9)
Lactic Acid, Venous: 2.4 mmol/L (ref 0.5–1.9)
Lactic Acid, Venous: 2 mmol/L (ref 0.5–1.9)

## 2022-10-09 LAB — CBC
HCT: 32.5 % — ABNORMAL LOW (ref 39.0–52.0)
Hemoglobin: 10.7 g/dL — ABNORMAL LOW (ref 13.0–17.0)
MCH: 31.1 pg (ref 26.0–34.0)
MCHC: 32.9 g/dL (ref 30.0–36.0)
MCV: 94.5 fL (ref 80.0–100.0)
Platelets: 226 10*3/uL (ref 150–400)
RBC: 3.44 MIL/uL — ABNORMAL LOW (ref 4.22–5.81)
RDW: 18.1 % — ABNORMAL HIGH (ref 11.5–15.5)
WBC: 3.1 10*3/uL — ABNORMAL LOW (ref 4.0–10.5)
nRBC: 0 % (ref 0.0–0.2)

## 2022-10-09 LAB — SARS CORONAVIRUS 2 BY RT PCR: SARS Coronavirus 2 by RT PCR: NEGATIVE

## 2022-10-09 LAB — PROCALCITONIN: Procalcitonin: <0.1 ng/mL

## 2022-10-09 LAB — CULTURE, BLOOD (ROUTINE X 2)

## 2022-10-09 LAB — URINALYSIS, ROUTINE W REFLEX MICROSCOPIC
Bilirubin Urine: NEGATIVE
Glucose, UA: NEGATIVE mg/dL
Hgb urine dipstick: NEGATIVE
Ketones, ur: NEGATIVE mg/dL
Leukocytes,Ua: NEGATIVE
Nitrite: NEGATIVE
Protein, ur: NEGATIVE mg/dL
Specific Gravity, Urine: 1.012 (ref 1.005–1.030)
pH: 5 (ref 5.0–8.0)

## 2022-10-09 LAB — APTT: aPTT: 41 seconds — ABNORMAL HIGH (ref 24–36)

## 2022-10-09 LAB — CBG MONITORING, ED: Glucose-Capillary: 186 mg/dL — ABNORMAL HIGH (ref 70–99)

## 2022-10-09 MED ORDER — ESCITALOPRAM OXALATE 10 MG PO TABS: 10.0000 mg | ORAL_TABLET | Freq: Every day | ORAL | Status: DC

## 2022-10-09 MED ORDER — ENSURE ENLIVE PO LIQD: 237.0000 mL | Freq: Two times a day (BID) | ORAL | Status: DC

## 2022-10-09 MED ORDER — CHLORHEXIDINE GLUCONATE CLOTH 2 % EX PADS
6.0000 | MEDICATED_PAD | Freq: Every day | CUTANEOUS | Status: DC
  Administered 2022-10-10 – 2022-10-11 (×2): 6 via TOPICAL

## 2022-10-09 MED ORDER — ORAL CARE MOUTH RINSE
15.0000 mL | OROMUCOSAL | Status: DC
  Administered 2022-10-10 – 2022-10-11 (×7): 15 mL via OROMUCOSAL

## 2022-10-09 MED ORDER — HALOPERIDOL LACTATE 5 MG/ML IJ SOLN: 2.0000 mg | Freq: Four times a day (QID) | INTRAMUSCULAR | Status: DC | PRN

## 2022-10-09 MED ORDER — SODIUM CHLORIDE 0.9 % IV BOLUS: 2000.0000 mL | Freq: Once | INTRAVENOUS | Status: DC

## 2022-10-09 MED ORDER — DIVALPROEX SODIUM 250 MG PO DR TAB
250.0000 mg | DELAYED_RELEASE_TABLET | Freq: Two times a day (BID) | ORAL | Status: DC
  Administered 2022-10-10: 250 mg via ORAL
  Filled 2022-10-09: qty 1

## 2022-10-09 MED ORDER — APIXABAN 5 MG PO TABS: 5.0000 mg | ORAL_TABLET | Freq: Two times a day (BID) | ORAL | Status: DC

## 2022-10-09 MED ORDER — MORPHINE SULFATE (PF) 2 MG/ML IV SOLN
2.0000 mg | INTRAVENOUS | Status: DC | PRN
  Administered 2022-10-09: 2 mg via INTRAVENOUS
  Filled 2022-10-09: qty 1

## 2022-10-09 MED ORDER — INSULIN ASPART 100 UNIT/ML IJ SOLN: 0.0000 [IU] | Freq: Every day | INTRAMUSCULAR | Status: DC

## 2022-10-09 MED ORDER — ACETAMINOPHEN 325 MG PO TABS: 650.0000 mg | ORAL_TABLET | Freq: Four times a day (QID) | ORAL | Status: DC | PRN

## 2022-10-09 MED ORDER — ORAL CARE MOUTH RINSE: 15.0000 mL | OROMUCOSAL | Status: DC | PRN

## 2022-10-09 MED ORDER — INSULIN GLARGINE-YFGN 100 UNIT/ML ~~LOC~~ SOLN
15.0000 [IU] | Freq: Every day | SUBCUTANEOUS | Status: DC
  Administered 2022-10-09: 15 [IU] via SUBCUTANEOUS
  Filled 2022-10-09 (×2): qty 0.15

## 2022-10-09 MED ORDER — SODIUM CHLORIDE 0.9 % IV BOLUS
1000.0000 mL | Freq: Once | INTRAVENOUS | Status: AC
  Administered 2022-10-09: 1000 mL via INTRAVENOUS

## 2022-10-09 MED ORDER — THIAMINE MONONITRATE 100 MG PO TABS: 100.0000 mg | ORAL_TABLET | Freq: Every day | ORAL | Status: DC

## 2022-10-09 MED ORDER — IPRATROPIUM-ALBUTEROL 0.5-2.5 (3) MG/3ML IN SOLN: 3.0000 mL | Freq: Four times a day (QID) | RESPIRATORY_TRACT | Status: DC | PRN

## 2022-10-09 MED ORDER — SPIRONOLACTONE 12.5 MG HALF TABLET: 12.5000 mg | ORAL_TABLET | Freq: Every day | ORAL | Status: DC

## 2022-10-09 MED ORDER — LACTATED RINGERS IV BOLUS
1000.0000 mL | Freq: Once | INTRAVENOUS | Status: AC
  Administered 2022-10-09: 1000 mL via INTRAVENOUS

## 2022-10-09 MED ORDER — ONDANSETRON HCL 4 MG PO TABS: 4.0000 mg | ORAL_TABLET | Freq: Four times a day (QID) | ORAL | Status: DC | PRN

## 2022-10-09 MED ORDER — POTASSIUM CHLORIDE IN NACL 40-0.9 MEQ/L-% IV SOLN
INTRAVENOUS | Status: DC
  Filled 2022-10-09 (×2): qty 1000

## 2022-10-09 MED ORDER — SODIUM CHLORIDE 0.9 % IV SOLN
500.0000 mg | INTRAVENOUS | Status: DC
  Administered 2022-10-09 – 2022-10-10 (×2): 500 mg via INTRAVENOUS
  Filled 2022-10-09 (×2): qty 5

## 2022-10-09 MED ORDER — FENTANYL CITRATE PF 50 MCG/ML IJ SOSY
25.0000 ug | PREFILLED_SYRINGE | INTRAMUSCULAR | Status: DC | PRN
  Administered 2022-10-09 – 2022-10-10 (×6): 25 ug via INTRAVENOUS
  Filled 2022-10-09 (×6): qty 1

## 2022-10-09 MED ORDER — ACETAMINOPHEN 650 MG RE SUPP: 650.0000 mg | Freq: Four times a day (QID) | RECTAL | Status: DC | PRN

## 2022-10-09 MED ORDER — VITAMIN B-12 100 MCG PO TABS: 100.0000 ug | ORAL_TABLET | Freq: Every day | ORAL | Status: DC

## 2022-10-09 MED ORDER — LORAZEPAM 0.5 MG PO TABS: 0.5000 mg | ORAL_TABLET | Freq: Four times a day (QID) | ORAL | Status: DC | PRN

## 2022-10-09 MED ORDER — OXYCODONE HCL 5 MG PO TABS: 5.0000 mg | ORAL_TABLET | ORAL | Status: DC | PRN

## 2022-10-09 MED ORDER — METOPROLOL SUCCINATE ER 25 MG PO TB24: 12.5000 mg | ORAL_TABLET | Freq: Every day | ORAL | Status: DC

## 2022-10-09 MED ORDER — BUSPIRONE HCL 5 MG PO TABS: 7.5000 mg | ORAL_TABLET | Freq: Two times a day (BID) | ORAL | Status: DC

## 2022-10-09 MED ORDER — LORAZEPAM 2 MG/ML IJ SOLN
0.5000 mg | Freq: Once | INTRAMUSCULAR | Status: AC
  Administered 2022-10-09: 0.5 mg via INTRAVENOUS
  Filled 2022-10-09: qty 1

## 2022-10-09 MED ORDER — SODIUM CHLORIDE 0.9 % IV SOLN
2.0000 g | INTRAVENOUS | Status: DC
  Administered 2022-10-09 – 2022-10-10 (×2): 2 g via INTRAVENOUS
  Filled 2022-10-09 (×2): qty 20

## 2022-10-09 MED ORDER — FENTANYL CITRATE PF 50 MCG/ML IJ SOSY
50.0000 ug | PREFILLED_SYRINGE | Freq: Once | INTRAMUSCULAR | Status: AC
  Administered 2022-10-09: 50 ug via INTRAVENOUS
  Filled 2022-10-09: qty 1

## 2022-10-09 MED ORDER — SODIUM CHLORIDE 0.9 % IV BOLUS
500.0000 mL | Freq: Once | INTRAVENOUS | Status: AC
  Administered 2022-10-09: 500 mL via INTRAVENOUS

## 2022-10-09 MED ORDER — ONDANSETRON HCL 4 MG/2ML IJ SOLN: 4.0000 mg | Freq: Four times a day (QID) | INTRAMUSCULAR | Status: DC | PRN

## 2022-10-09 MED ORDER — INSULIN ASPART 100 UNIT/ML IJ SOLN
0.0000 [IU] | Freq: Three times a day (TID) | INTRAMUSCULAR | Status: DC
  Administered 2022-10-10: 2 [IU] via SUBCUTANEOUS

## 2022-10-09 MED ORDER — IOHEXOL 300 MG/ML  SOLN
80.0000 mL | Freq: Once | INTRAMUSCULAR | Status: AC | PRN
  Administered 2022-10-09: 80 mL via INTRAVENOUS

## 2022-10-09 NOTE — Assessment & Plan Note (Signed)
-   Resume Repatha at discharge 

## 2022-10-09 NOTE — Sepsis Progress Note (Signed)
Sepsis bundle complete.  Pt admitted to ICU, monitoring to be continued by bedside RN  

## 2022-10-09 NOTE — Sepsis Progress Note (Signed)
Sepsis protocol monitored by eLink ?

## 2022-10-09 NOTE — Assessment & Plan Note (Signed)
-   Continue home psych medications including Depakote, BuSpar, Lexapro - Reorient as necessary - Haldol for behavioral disturbance - Continue to monitor

## 2022-10-09 NOTE — Assessment & Plan Note (Signed)
-   Continue home medications of Toprol and Eliquis - Patient did recently have a fall but hemoglobin stable - CT head shows no acute intracranial abnormality

## 2022-10-09 NOTE — ED Notes (Signed)
Pt pulling off cardiac leads.

## 2022-10-09 NOTE — ED Triage Notes (Addendum)
[facility] called EMS for an unwitnessed fall. Per EMS pt on blood thinner and unknown if pt hit head (dry blood in nose.) No deformities noted. Pt has hx of a-fib, pupils WDL. Pt doses off then will wake up confused and then dose back off. Per EMS pt has hx of HTN and violence.

## 2022-10-09 NOTE — Assessment & Plan Note (Signed)
-   Patient's temperature is down to 94.2 - No bacterial infection identified as of yet - Blood cultures pending - No acute changes on CT head - TSH pending - Rapid fluid resuscitation could be contributing with 3 L bolus in the ED - Slow IV fluid rate to 75 mill per hour - Continue warming blanket as needed - Closely monitor temperature - Continue to monitor

## 2022-10-09 NOTE — Assessment & Plan Note (Signed)
-   Patient from [facility] - Unwitnessed fall - CT head is without acute changes - CT C-spine is without acute changes - CT chest shows 10th, 11th, 12th rib fractures - X-ray pelvis shows no acute changes - Pain control with pain scale - Incentive spirometer for rib fractures - Continue to monitor

## 2022-10-09 NOTE — Assessment & Plan Note (Signed)
-   Continue beta-blocker, Aldactone - Holding Lasix at this time - Continue to monitor

## 2022-10-09 NOTE — ED Notes (Signed)
Updated daughter [name] at this time. Per daughter states multiple falls and states previously when this happens pt normally has some sort of infection. Daughter also states that pt has had to have fluid removed from his pericardium before as well. Informed daughter I would let the provider know.

## 2022-10-09 NOTE — Assessment & Plan Note (Signed)
-   Secondary to fall - CT chest shows 10th, 11th, 12th rib fractures - Incentive spirometer if patient is able to participate - Closely monitor

## 2022-10-09 NOTE — Assessment & Plan Note (Signed)
-   BUN 73, creatinine 1.53 - Baseline creatinine 1.05 - Likely due to poor p.o. intake given behavioral disturbances, but not completely clear - CT abdomen pelvis shows constipation, cirrhosis, but no acute renal pathology - Hold nephrotoxic agents when possible - 3 L bolus given in the ED - Hold Lasix - Trend labs in the a.m.

## 2022-10-09 NOTE — Assessment & Plan Note (Signed)
-   26 units of long-acting insulin at baseline - Will continue reduced rate of basal insulin here at 15 units - Sliding scale coverage - Heart healthy/carb modified diet - Continue to monitor

## 2022-10-09 NOTE — ED Provider Notes (Signed)
[place] EMERGENCY DEPARTMENT AT [facility] Provider Note   CSN: [id number] Arrival date & time: 10/11/2022  1224     History  Chief Complaint  Patient presents with   Mark Cabrera is a 72 y.o. male.  Level 5 caveat secondary to dementia.  Patient brought in from his nursing home after unwitnessed fall.  Some signs of dried blood around his face.  Patient unable to give any significant history.  The history is provided by the EMS personnel.  Fall This is a new problem. He has tried nothing for the symptoms. The treatment provided no relief.       Home Medications Prior to Admission medications   Medication Sig Start Date End Date Taking? Authorizing Provider  Accu-Chek Softclix Lancets lancets Use as instructed 12/03/20   Dolan Amen, MD  acetaminophen (TYLENOL) 325 MG tablet Take 2 tablets (650 mg total) by mouth every 4 (four) hours as needed for headache or mild pain. 04/28/16   Leone Brand, NP  apixaban Everlene Balls) 5 MG TABS tablet Take 1 tablet by mouth twice daily 12/08/20   Kathleene Hazel, MD  busPIRone (BUSPAR) 7.5 MG tablet Take 1 tablet (7.5 mg total) by mouth 2 (two) times daily. 08/11/22   Regalado, Belkys A, MD  clotrimazole (LOTRIMIN) 1 % cream Apply topically. 07/31/22   [provider]  divalproex (DEPAKOTE) 250 MG DR tablet Take 1 tablet (250 mg total) by mouth 2 (two) times daily. 08/11/22   Regalado, Belkys A, MD  escitalopram (LEXAPRO) 10 MG tablet Take 1 tablet (10 mg total) by mouth daily. 08/11/22   Regalado, Belkys A, MD  Evolocumab (REPATHA) 140 MG/ML SOSY Inject 140 mg into the skin. Every 14 days    [provider]  fluconazole (DIFLUCAN) 150 MG tablet Take 1 tablet (150 mg total) by mouth every three (3) days as needed (for scrotal fungal infection). 08/08/22   Leatha Gilding, MD  furosemide (LASIX) 40 MG tablet Take 1 tablet (40 mg total) by mouth 2 (two) times daily. 08/11/22   Regalado, Belkys A, MD   glucose blood (ACCU-CHEK GUIDE) test strip Use as instructed 12/03/20   Dolan Amen, MD  insulin aspart (NOVOLOG FLEXPEN) 100 UNIT/ML FlexPen INJECT 5 UNITS UNDER THE SKIN THREE TIMES DAILY WITH MEALS 03/03/22   Willette Cluster, MD  insulin glargine-yfgn (SEMGLEE) 100 UNIT/ML injection Inject 0.26 mLs (26 Units total) into the skin at bedtime. 03/03/22   Willette Cluster, MD  Insulin Pen Needle 31G X 5 MM MISC Use one pen needle three times daily. Dx E11.49 09/14/21   Evlyn Kanner, MD  ipratropium-albuterol (DUONEB) 0.5-2.5 (3) MG/3ML SOLN Inhale 3 mLs into the lungs every 6 (six) hours as needed (for shortness of breath).    [provider]  LORazepam (ATIVAN) 0.5 MG tablet Take 1 tablet (0.5 mg total) by mouth every 6 (six) hours as needed for anxiety. 08/11/22   Regalado, Jon Billings A, MD  metFORMIN (GLUCOPHAGE) 500 MG tablet Take 1 tablet (500 mg total) by mouth 2 (two) times daily with a meal. 12/30/20 03/19/23  Steffanie Rainwater, MD  metoprolol succinate (TOPROL-XL) 25 MG 24 hr tablet Take 0.5 tablets (12.5 mg total) by mouth daily. 01/26/22 09/05/22  Adron Bene, MD  polyethylene glycol (MIRALAX / GLYCOLAX) 17 g packet Take 17 g by mouth daily. 09/29/21   Morene Crocker, MD  potassium chloride (KLOR-CON M) 10 MEQ tablet Take 1 tablet (10 mEq total) by  mouth daily. 08/11/22   Regalado, Belkys A, MD  QUEtiapine (SEROQUEL) 25 MG tablet Take 3 tablets (75 mg total) by mouth at bedtime. 08/11/22   Regalado, Belkys A, MD  spironolactone (ALDACTONE) 25 MG tablet Take 0.5 tablets (12.5 mg total) by mouth daily. 03/04/22   Willette Cluster, MD  thiamine (VITAMIN B-1) 100 MG tablet Take 1 tablet (100 mg total) by mouth daily. 08/12/22   Regalado, Belkys A, MD  traZODone (DESYREL) 50 MG tablet Take 0.5 tablets (25 mg total) by mouth at bedtime as needed for sleep. 08/11/22   Regalado, Belkys A, MD  valACYclovir (VALTREX) 1000 MG tablet Take 1 tablet (1,000 mg total) by mouth 2 (two) times daily as  needed (if having herpes outbreak). 08/08/22   Leatha Gilding, MD  vitamin B-12 (CYANOCOBALAMIN) 100 MCG tablet Take 100 mcg by mouth daily.    [provider]      Allergies    Morpholine salicylate, Penicillins, Morphine and codeine, Sglt2 inhibitors, and Statins    Review of Systems   Review of Systems  Unable to perform ROS: Dementia    Physical Exam Updated Vital Signs BP 103/64 (BP Location: Left Arm)   Pulse 78   Temp (!) 94.2 F (34.6 C) (Rectal) Comment: EDP notified, bear warmer applied  Resp 16   Ht 5\' 8"  (1.727 m)   Wt 76.9 kg   SpO2 90%   BMI 25.79 kg/m  Physical Exam Vitals and nursing note reviewed.  Constitutional:      General: He is not in acute distress.    Appearance: Normal appearance. He is well-developed.  HENT:     Head: Normocephalic and atraumatic.  Eyes:     Conjunctiva/sclera: Conjunctivae normal.  Cardiovascular:     Rate and Rhythm: Normal rate and regular rhythm.     Heart sounds: No murmur heard. Pulmonary:     Effort: Pulmonary effort is normal. No respiratory distress.     Breath sounds: Normal breath sounds.  Abdominal:     Palpations: Abdomen is soft.     Tenderness: There is no abdominal tenderness. There is no guarding or rebound.  Musculoskeletal:        General: No deformity. Normal range of motion.     Cervical back: Neck supple.     Comments: He has some pain with range of motion of his left hip although does not appear to have any gross deficits.  He prefers to stay curled up in a ball.  Do not see any gross deformities.  Skin:    General: Skin is warm and dry.     Capillary Refill: Capillary refill takes less than 2 seconds.  Neurological:     General: No focal deficit present.     Mental Status: He is alert. He is disoriented.     ED Results / Procedures / Treatments   Labs (all labs ordered are listed, but only abnormal results are displayed) Labs Reviewed  COMPREHENSIVE METABOLIC PANEL - Abnormal;  Notable for the following components:      Result Value   Sodium 132 (*)    Potassium 3.1 (*)    CO2 20 (*)    Glucose, Bld 191 (*)    BUN 73 (*)    Creatinine, Ser 1.53 (*)    Calcium 8.4 (*)    Albumin 3.4 (*)    AST 58 (*)    Total Bilirubin 2.6 (*)    GFR, Estimated 47 (*)    All  other components within normal limits  CBC - Abnormal; Notable for the following components:   WBC 3.1 (*)    RBC 3.44 (*)    Hemoglobin 10.7 (*)    HCT 32.5 (*)    RDW 18.1 (*)    All other components within normal limits  LACTIC ACID, PLASMA - Abnormal; Notable for the following components:   Lactic Acid, Venous 2.1 (*)    All other components within normal limits  PROTIME-INR - Abnormal; Notable for the following components:   Prothrombin Time 20.6 (*)    INR 1.7 (*)    All other components within normal limits  APTT - Abnormal; Notable for the following components:   aPTT 41 (*)    All other components within normal limits  CBG MONITORING, ED - Abnormal; Notable for the following components:   Glucose-Capillary 186 (*)    All other components within normal limits  CULTURE, BLOOD (ROUTINE X 2)  SARS CORONAVIRUS 2 BY RT PCR  CULTURE, BLOOD (ROUTINE X 2)  URINALYSIS, ROUTINE W REFLEX MICROSCOPIC  LACTIC ACID, PLASMA  CBG MONITORING, ED    EKG EKG Interpretation Date/Time:  Monday October 09 2022 12:31:06 EDT Ventricular Rate:  98 PR Interval:    QRS Duration:  130 QT Interval:  443 QTC Calculation: 543 R Axis:   -56  Text Interpretation: Atrial fibrillation Ventricular tachycardia, unsustained Aberrant conduction of SV complex(es) Left bundle branch block LBBB wider than prior Confirmed by Meridee Score [phone]) on 09/28/2022 12:34:35 PM  Radiology CT CHEST ABDOMEN PELVIS W CONTRAST  Result Date: 09/15/2022 CLINICAL DATA:  Status post fall. EXAM: CT CHEST, ABDOMEN, AND PELVIS WITH CONTRAST TECHNIQUE: Multidetector CT imaging of the chest, abdomen and pelvis was performed following  the standard protocol during bolus administration of intravenous contrast. RADIATION DOSE REDUCTION: This exam was performed according to the departmental dose-optimization program which includes automated exposure control, adjustment of the mA and/or kV according to patient size and/or use of iterative reconstruction technique. CONTRAST:  80mL OMNIPAQUE IOHEXOL 300 MG/ML  SOLN COMPARISON:  February 27, 2022 FINDINGS: CT CHEST FINDINGS Cardiovascular: There is moderate to marked severity calcification of the aortic arch, without evidence of aortic aneurysm. There is mild cardiomegaly with marked severity coronary artery calcification. 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 A metallic density atrial appendage clip is also noted. No pericardial effusion. Mediastinum/Nodes: No enlarged mediastinal, hilar, or axillary lymph nodes. Thyroid gland, trachea, and esophagus demonstrate no significant findings. Lungs/Pleura: Mild posterior left upper lobe and mild bilateral lower lobe atelectasis is seen. There is a large left pleural effusion. No pneumothorax is identified. Musculoskeletal: Acute posterior tenth, eleventh and twelfth left rib fractures are seen. CT ABDOMEN PELVIS FINDINGS Hepatobiliary: The liver is cirrhotic in appearance. The gallbladder is partially contracted, without evidence of gallstones, gallbladder wall thickening or pericholecystic inflammation. Pancreas: Unremarkable. No pancreatic ductal dilatation or surrounding inflammatory changes. Spleen: Normal in size without focal abnormality. Adrenals/Urinary Tract: Adrenal glands are unremarkable. Kidneys are normal, without renal calculi, focal lesion, or hydronephrosis. Bladder is unremarkable. Stomach/Bowel: Stomach is within normal limits. The appendix is not clearly identified. A moderate to large amount of stool is seen within the distal sigmoid colon and rectum. No evidence of bowel wall thickening, distention, or inflammatory changes.  Vascular/Lymphatic: Aortic atherosclerosis. No enlarged abdominal or pelvic lymph nodes. Reproductive: There is mild to moderate severity prostatomegaly. Other: A 3.0 cm x 2.0 cm fat containing left inguinal hernia is seen. No abdominopelvic ascites. Musculoskeletal: A fracture deformity  of indeterminate age is seen involving the left lateral transverse process of L1. Bilateral pars defects are seen at the level of L5. Very small, linear, nondisplaced cortical irregularities of indeterminate age are seen along the anterior aspect of the lower sacrum. Multilevel degenerative changes seen throughout the lumbar spine IMPRESSION: 1. Acute posterior tenth, eleventh and twelfth left rib fractures. 2. Large left pleural effusion. 3. Mild posterior left upper lobe and mild bilateral lower lobe atelectasis. 4. Cirrhotic liver. 5. Moderate to large amount of stool within the distal sigmoid colon and rectum. 6. 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 7. Fracture deformity of indeterminate age involving the left lateral transverse process of L1. 8. Aortic atherosclerosis. Aortic Atherosclerosis (ICD10-I70.0). Electronically Signed   By: Aram Candela M.D.   On: 10/06/2022 16:48   CT Cervical Spine Wo Contrast  Result Date: 10/10/2022 CLINICAL DATA:  Status post fall. EXAM: CT CERVICAL SPINE WITHOUT CONTRAST TECHNIQUE: Multidetector CT imaging of the cervical spine was performed without intravenous contrast. Multiplanar CT image reconstructions were also generated. RADIATION DOSE REDUCTION: This exam was performed according to the departmental dose-optimization program which includes automated exposure control, adjustment of the mA and/or kV according to patient size and/or use of iterative reconstruction technique. COMPARISON:  November 08, 2020 FINDINGS: Alignment: Normal. Skull base and vertebrae: No acute fracture. No primary bone lesion or focal pathologic process. Soft tissues and spinal canal: No prevertebral  fluid or swelling. No visible canal hematoma. Disc levels: Mild multilevel endplate sclerosis is seen throughout all levels of the cervical spine. Moderate severity endplate sclerosis and mild anterior osteophyte formation are also seen at the level of C7-T1. Moderate severity posterior bony spurring is seen at C4-C5. There is mild multilevel intervertebral disc space narrowing. Bilateral mild-to-moderate severity multilevel facet joint hypertrophy is noted. Upper chest: A large pleural effusion is seen within the left apex. Other: It should be noted that the study is limited secondary to patient motion. IMPRESSION: 1. Limited study secondary to patient motion. 2. No evidence of an acute fracture or subluxation. 3. Mild to moderate severity multilevel degenerative changes, as described above. 4. Large left pleural effusion. Electronically Signed   By: Aram Candela M.D.   On: 09/12/2022 16:34   CT Head Wo Contrast  Result Date: 10/05/2022 CLINICAL DATA:  Unwitnessed fall. EXAM: CT HEAD WITHOUT CONTRAST TECHNIQUE: Contiguous axial images were obtained from the base of the skull through the vertex without intravenous contrast. RADIATION DOSE REDUCTION: This exam was performed according to the departmental dose-optimization program which includes automated exposure control, adjustment of the mA and/or kV according to patient size and/or use of iterative reconstruction technique. COMPARISON:  Jul 29, 2022 FINDINGS: Brain: There is mild to moderate severity cerebral atrophy with widening of the extra-axial spaces and ventricular dilatation. There are areas of decreased attenuation within the white matter tracts of the supratentorial brain, consistent with microvascular disease changes. There is a small, chronic left occipital lobe infarct. Vascular: No hyperdense vessel or unexpected calcification. Skull: Normal. Negative for fracture or focal lesion. Sinuses/Orbits: Postoperative changes are seen involving  globe. Other: None. IMPRESSION: 1. Generalized cerebral atrophy with chronic white matter small vessel ischemic changes. 2. Small, chronic left occipital lobe infarct. 3. No acute intracranial abnormality. Electronically Signed   By: Aram Candela M.D.   On: 09/18/2022 16:30   DG Pelvis Portable  Result Date: 10/11/2022 CLINICAL DATA:  fall pain EXAM: PORTABLE PELVIS 1-2 VIEWS COMPARISON:  07/29/2022. FINDINGS: Pelvis is intact with  normal and symmetric sacroiliac joints. No acute fracture or dislocation. No aggressive osseous lesion. Visualized sacral arcuate lines are unremarkable. Unremarkable symphysis pubis. There are mild degenerative changes of bilateral hip joints without significant joint space narrowing. Osteophytosis of the superior acetabulum. No radiopaque foreign bodies. IMPRESSION: No acute fracture or dislocation. Mild degenerative changes of bilateral hip joints. Electronically Signed   By: Jules Schick M.D.   On: 10/01/2022 14:04   DG Chest Port 1 View  Result Date: 10/01/2022 CLINICAL DATA:  fall pain EXAM: PORTABLE CHEST 1 VIEW COMPARISON:  08/09/2022. FINDINGS: Low lung volume. Mild diffuse interstitial prominence. It is uncertain whether this represents artifactual "crowding" of the interstitium related to suboptimal inspiration versus pulmonary vascular congestion. Redemonstration of left retrocardiac airspace opacity obscuring the left hemidiaphragm, descending thoracic aorta and blunting the left lateral costophrenic angle suggesting combination of left lower lobe atelectasis and/or consolidation with pleural effusion. No significant interval change since the prior study. Bilateral lungs are otherwise clear. Right lateral costophrenic angle is clear. Stable mildly enlarged cardio-mediastinal silhouette. No acute osseous abnormalities. The soft tissues are within normal limits. IMPRESSION: *Left lower lobe atelectasis and/or consolidation with pleural effusion. *Mild diffuse  interstitial prominence. It is uncertain whether this represents artifactual "crowding" of the interstitium related to suboptimal inspiration versus pulmonary vascular congestion. Electronically Signed   By: Jules Schick M.D.   On: 09/22/2022 14:03    Procedures .Critical Care  Performed by: Terrilee Files, MD Authorized by: Terrilee Files, MD   Critical care provider statement:    Critical care time (minutes):  45   Critical care time was exclusive of:  Separately billable procedures and treating other patients   Critical care was necessary to treat or prevent imminent or life-threatening deterioration of the following conditions:  CNS failure or compromise and trauma   Critical care was time spent personally by me on the following activities:  Development of treatment plan with patient or surrogate, discussions with consultants, evaluation of patient's response to treatment, examination of patient, obtaining history from patient or surrogate, ordering and performing treatments and interventions, ordering and review of laboratory studies, ordering and review of radiographic studies, pulse oximetry, re-evaluation of patient's condition and review of old charts   I assumed direction of critical care for this patient from another provider in my specialty: no       Medications Ordered in ED Medications  cefTRIAXone (ROCEPHIN) 2 g in sodium chloride 0.9 % 100 mL IVPB (2 g Intravenous New Bag/Given 09/13/2022 1601)  azithromycin (ZITHROMAX) 500 mg in sodium chloride 0.9 % 250 mL IVPB (500 mg Intravenous New Bag/Given 09/14/2022 1604)  fentaNYL (SUBLIMAZE) injection 50 mcg (50 mcg Intravenous Given 09/12/2022 1440)  sodium chloride 0.9 % bolus 1,000 mL (1,000 mLs Intravenous Bolus 09/11/2022 1440)  iohexol (OMNIPAQUE) 300 MG/ML solution 80 mL (80 mLs Intravenous Contrast Given 09/30/2022 1546)  lactated ringers bolus 1,000 mL (1,000 mLs Intravenous New Bag/Given 10/01/2022 1616)  LORazepam (ATIVAN) injection  0.5 mg (0.5 mg Intravenous Given 10/02/2022 1657)    ED Course/ Medical Decision Making/ A&P Clinical Course as of 10/03/2022 1709  Mon Oct 09, 2022  1316 Chest x-ray showing possible free air under the diaphragm.  Awaiting radiology reading. [MB]  1456 Patient just had a rectal temperature done he was found to be hypothermic.  I have ordered blood cultures lactate and fluids.  Bear hugger applied. [MB]    Clinical Course User Index [MB] Terrilee Files, MD  Medical Decision Making Amount and/or Complexity of Data Reviewed Labs: ordered. Radiology: ordered.  Risk Prescription drug management.   This patient complains of unwitnessed fall; this involves an extensive number of treatment Options and is a complaint that carries with it a high risk of complications and morbidity. The differential includes fracture, contusion, bleed, infection with her mental status  I ordered, reviewed and interpreted labs, which included patient with chronic low white count stable hemoglobin, chemistries with low potassium elevated BUN and creatinine elevated glucose, urinalysis without signs of infection, blood cultures and lactate sent I ordered medication IV fluids IV pain medicine and reviewed PMP when indicated. I ordered imaging studies which included chest and pelvis x-ray, CT head cervical spine chest abdomen and pelvis and I independently    visualized and interpreted imaging which showed left atelectasis versus infiltrate, CTs pending at time of signout Additional history obtained from EMS Previous records obtained and reviewed in epic, patient last admitted 2 months ago for CHF Cardiac monitoring reviewed, normal sinus rhythm Social determinants considered, patient physically inactive increase stressors Critical Interventions: Workup of patient's fall and now hypothermic requiring sepsis workup complex decision making  After the interventions stated above, I  reevaluated the patient and found resting comfortably soft blood pressures no distress Admission and further testing considered, his care is signed out to Dr. Estell Harpin to follow-up on results of CTs.  Likely is going to need to require admission although if all workup negative possible could return back to facility for close monitoring.         Final Clinical Impression(s) / ED Diagnoses Final diagnoses:  Fall, initial encounter  Injury of head, initial encounter  Hypothermia, initial encounter    Rx / DC Orders ED Discharge Orders     None         Terrilee Files, MD 10/05/2022 1714

## 2022-10-09 NOTE — Assessment & Plan Note (Signed)
-   Creatinine baseline 1.05 - Creatinine today 1.53 - Secondary to dehydration - See plan above

## 2022-10-09 NOTE — ED Notes (Signed)
Daughter [name] updated at this time.

## 2022-10-09 NOTE — Assessment & Plan Note (Addendum)
-   With hypothermia 94.2, tachycardia 95, tachypnea 23, leukopenia at 3.1, lactic acidosis 2.1 - UA is not indicative of UTI - Blood culture pending - Chest x-ray showed possible left lower lobe consolidation with pleural effusion however CT chest did not show that - Procalcitonin is undetectable - Patient started on Rocephin and Zithromax in the ED - Will continue antibiotic coverage until blood cultures result - Tachycardia and tachypnea may be related to agitation, control agitation with pain control and Haldol as needed  -continue to monitor

## 2022-10-09 NOTE — H&P (Signed)
History and Physical    Patient: Mark Cabrera ZOX:[id number] DOB: [birth year] DOA: 09/25/2022 DOS: the patient was seen and examined on 09/13/2022 PCP: Mark Doheny, MD  Patient coming from: SNF  Chief Complaint:  Chief Complaint  Patient presents with   Fall   HPI: Mark Cabrera is a 72 y.o. male with medical history significant of CHF, atrial fibrillation, coronary artery disease status post two-vessel CABG, depression, dementia with behavioral disturbance, GERD, heart murmur, hyperlipidemia, hypertension, stroke, transaminitis, and more presents the ED with a chief complaint of fall.  Patient had an unwitnessed fall at [facility].  EMS was called.  Patient is on blood thinners.  No further history could be obtained from the patient as he is demented, on a dementia ward with ankle bracelet lockdown.  Patient did have evidence of epistaxis with some dried blood in his nares.  He was hypothermic in the ER for unknown reasons.  Sepsis protocol was done.  Procalcitonin is negative.  Patient did have a lactic acidosis which is currently being trended.  Patient has blood cultures being trended.  UA was not indicative of UTI.  Chest x-ray showed a possible left lower lobe consolidation but that was not demonstrated on CT chest.  Patient does have a leukopenia at 3.1 but this is actually improved from previous 2.6.  Patient is in A-fib with history of the same.  Warming blanket was started in the ED.  Patient was started on Rocephin and Zithromax.  Patient was admitted to stepdown due to warming blanket.  Imaging did show rib fractures and sinus remedy has been ordered, but patient is not likely to be able to participate.  DNR at bedside. Review of Systems: unable to review all systems due to the inability of the patient to answer questions. Past Medical History:  Diagnosis Date   Acute diastolic congestive heart failure (HCC)    AKI (acute kidney injury) (HCC)    Aortic valve stenosis  s/p AVR 2018   Echo 2/22: Poor acoustic windows, EF 50-55, mild LVH, normal RVSF, mild MR, trivial AI, no AS, no pericardial effusion   Atrial fibrillation (HCC) - post-op CABG    04/2016 CHA2DS2VAS score = 5   Atypical nevi    Coronary artery disease s/p 2 vessel CABG    Depression    "years ago"   Diabetes mellitus    Dyspnea    in the past    GERD (gastroesophageal reflux disease)    Heart murmur    Hepatic cirrhosis (HCC) 06/29/2017   History of kidney stones    Hyperlipidemia    hx of transaminitis secondary to statin and he has decided not to use statins secondary to potential side effects.   Hypertension    Nephrolithiasis    Osteoarthritis, knee    Pericardial effusion a. subxiphoid pericardial window on 04/21/2016 04/28/2016   Peripheral neuropathic pain 05/12/2016   Sleep apnea     Central apnea. 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 2-D echo was normal no embolic source found.   Past Surgical History:  Procedure Laterality Date   AORTIC VALVE REPLACEMENT N/A  03/30/2016   Procedure: AORTIC VALVE REPLACEMENT (AVR);  Surgeon: Mark Slot, MD;  Location: [facility] OR;  Service: Open Heart Surgery;  Laterality: N/A;   CARDIAC CATHETERIZATION N/A 03/15/2016   Procedure: Right/Left Heart Cath and Coronary Angiography;  Surgeon: Mark Hazel, MD;  Location: [facility] INVASIVE CV LAB;  Service: Cardiovascular;  Laterality: N/A;   CLIPPING OF ATRIAL APPENDAGE N/A 03/30/2016   Procedure: CLIPPING OF LEFT ATRIAL APPENDAGE;  Surgeon: Mark Slot, MD;  Location: [facility] OR;  Service: Open Heart Surgery;  Laterality: N/A;   CORONARY  ARTERY BYPASS GRAFT N/A 03/30/2016   Procedure: CORONARY ARTERY BYPASS GRAFTING (CABG) Times Two;  Surgeon: Mark Slot, MD;  Location: [facility] OR;  Service: Open Heart Surgery;  Laterality: N/A;   IR THORACENTESIS ASP PLEURAL SPACE W/IMG GUIDE  02/28/2022   IR THORACENTESIS ASP PLEURAL SPACE W/IMG GUIDE  03/02/2022   KNEE ARTHROSCOPY W/ PARTIAL MEDIAL MENISCECTOMY  05/12/2005   right, performed by Dr. Madelon Cabrera for torn medial meniscus.   ROTATOR CUFF REPAIR Right 2016   STERNAL WIRES REMOVAL N/A 08/13/2017   Procedure: STERNAL WIRES REMOVAL;  Surgeon: Mark Slot, MD;  Location: [facility] OR;  Service: Thoracic;  Laterality: N/A;   SUBXYPHOID PERICARDIAL WINDOW N/A 04/21/2016   Procedure: SUBXYPHOID PERICARDIAL WINDOW;  Surgeon: Mark Slot, MD;  Location: [facility] OR;  Service: Thoracic;  Laterality: N/A;   TEE WITHOUT CARDIOVERSION N/A 03/30/2016   Procedure: TRANSESOPHAGEAL ECHOCARDIOGRAM (TEE);  Surgeon: Mark Slot, MD;  Location: [facility] OR;  Service: Open Heart Surgery;  Laterality: N/A;   TEE WITHOUT CARDIOVERSION N/A 04/21/2016   Procedure: TRANSESOPHAGEAL ECHOCARDIOGRAM (TEE);  Surgeon: Mark Slot, MD;  Location: [facility] OR;  Service: Thoracic;  Laterality: N/A;   Social History:  reports that he has never smoked. He has never used smokeless tobacco. He reports current alcohol use of about 1.0 standard drink of alcohol per week. He reports that he does not use drugs.  Allergies  Allergen Reactions   Morpholine Salicylate Other (See Comments)    Hallucinations   Penicillins Other (See Comments)    Allergic- reaction?? Has patient had a PCN reaction causing immediate rash, facial/tongue/throat swelling, SOB or lightheadedness with hypotension: Unk Has patient had a PCN reaction causing severe rash involving mucus membranes or skin necrosis: Unk Has patient had a PCN reaction that required hospitalization: Unk Has patient had a PCN reaction occurring within the last 10  years: No If all of the above answers are "NO", then may proceed with Cephalosporin use.   Morphine And Codeine Other (See Comments)    Hallucinations    Sglt2 Inhibitors Rash and Other (See Comments)    Candidiasis infection prone   Statins Rash and Other (See Comments)    Severe rash and back pain, and muscle pain    Family History  Problem Relation Age of Onset   Hypertension Mother    Diabetes Mother    Alcohol abuse Father     Prior to Admission medications   Medication Sig Start Date End Date Taking? Authorizing Provider  acetaminophen (TYLENOL) 325 MG tablet Take 2 tablets (650 mg total) by mouth every 4 (four) hours as needed for headache or mild pain. 04/28/16  Yes Leone Brand, NP  apixaban (ELIQUIS) 5 MG TABS tablet Take 1 tablet by mouth twice daily 12/08/20  Yes Mark Hazel, MD  busPIRone (BUSPAR) 7.5 MG tablet Take 1 tablet (7.5 mg total) by mouth 2 (two) times daily. 08/11/22  Yes Regalado, Belkys A, MD  Cholecalciferol (VITAMIN D3) 50 MCG (2000 UT) capsule Take 2,000 Units by mouth daily.   Yes [provider]  clotrimazole (LOTRIMIN) 1 % cream Apply topically. 07/31/22  Yes [provider]  divalproex (DEPAKOTE) 250 MG DR tablet Take 1 tablet (250 mg total) by mouth 2 (two) times daily. 08/11/22  Yes Regalado, Belkys A, MD  escitalopram (LEXAPRO) 10 MG tablet Take 1 tablet (10 mg total) by mouth daily. 08/11/22  Yes Regalado, Belkys A, MD  Evolocumab (REPATHA) 140 MG/ML SOSY Inject 140 mg into the skin. Every 14 days   Yes [provider]  ferrous sulfate 325 (65 FE) MG tablet Take 325 mg by mouth daily with breakfast.   Yes [provider]  fluconazole (DIFLUCAN) 150 MG tablet Take 1 tablet (150 mg total) by mouth every three (3) days as needed (for scrotal fungal infection). 08/08/22  Yes Gherghe, Daylene Katayama, MD  furosemide (LASIX) 40 MG tablet Take 1 tablet (40 mg total) by mouth 2 (two) times daily. 08/11/22  Yes Regalado,  Belkys A, MD  HYDROcodone-acetaminophen (NORCO/VICODIN) 5-325 MG tablet Take 1 tablet by mouth every 6 (six) hours as needed for moderate pain.   Yes [provider]  insulin glargine-yfgn (SEMGLEE) 100 UNIT/ML injection Inject 0.26 mLs (26 Units total) into the skin at bedtime. 03/03/22  Yes Willette Cluster, MD  insulin lispro (HUMALOG) 100 UNIT/ML KwikPen Inject 5 Units into the skin 3 (three) times daily. Hold if CBG<450 09/10/22  Yes [provider]  ipratropium-albuterol (DUONEB) 0.5-2.5 (3) MG/3ML SOLN Inhale 3 mLs into the lungs every 6 (six) hours as needed (for shortness of breath).   Yes [provider]  LORazepam (ATIVAN) 0.5 MG tablet Take 1 tablet (0.5 mg total) by mouth every 6 (six) hours as needed for anxiety. 08/11/22  Yes Regalado, Belkys A, MD  metFORMIN (GLUCOPHAGE) 500 MG tablet Take 1 tablet (500 mg total) by mouth 2 (two) times daily with a meal. 12/30/20 03/19/23 Yes Amponsah, Flossie Buffy, MD  metoprolol succinate (TOPROL-XL) 25 MG 24 hr tablet Take 0.5 tablets (12.5 mg total) by mouth daily. 01/26/22 10/11/2022 Yes Adron Bene, MD  polyethylene glycol (MIRALAX / GLYCOLAX) 17 g packet Take 17 g by mouth daily. 09/29/21  Yes Morene Crocker, MD  potassium chloride (KLOR-CON M) 10 MEQ tablet Take 1 tablet (10 mEq total) by mouth daily. 08/11/22  Yes Regalado, Belkys A, MD  spironolactone (ALDACTONE) 25 MG tablet Take 0.5 tablets (12.5 mg total) by mouth daily. 03/04/22  Yes Willette Cluster, MD  thiamine (VITAMIN B-1) 100 MG tablet Take 1 tablet (100 mg total) by mouth daily. 08/12/22  Yes Regalado, Belkys A, MD  valACYclovir (VALTREX) 1000 MG tablet Take 1 tablet (1,000 mg total) by mouth 2 (two) times daily as needed (if having herpes outbreak). 08/08/22  Yes Gherghe, Daylene Katayama, MD  vitamin B-12 (CYANOCOBALAMIN) 100 MCG tablet Take 100 mcg by mouth daily.   Yes [provider]  Accu-Chek Softclix Lancets lancets Use as instructed 12/03/20   Dolan Amen, MD  glucose blood (ACCU-CHEK GUIDE) test strip Use as instructed 12/03/20   Dolan Amen, MD  Insulin Pen Needle 31G X 5 MM MISC Use one pen needle three times daily. Dx E11.49 09/14/21   Evlyn Kanner, MD    Physical Exam: Vitals:   09/23/2022 1755 09/22/2022 1900 10/07/2022 1946 09/24/2022 2012  BP: 90/71 (!) 88/63  93/65  Pulse: 82 85  80  Resp: Marland Kitchen)  25 13  (!) 26  Temp:   (!) 95.9 F (35.5 C)   TempSrc:   Rectal   SpO2: 98% 97%  100%  Weight:      Height:       1.  General: Patient lying supine in bed,  no acute distress   2. Psychiatric: Alert and oriented x 0, restless in bed with intermittent episodes of moaning   3. Neurologic: Patient is nonverbal at this time, face is symmetric, moves all 4 extremities voluntarily, unknown baseline mentation but patient is on dementia unit   4. HEENMT:  Head is atraumatic, normocephalic, pupils reactive to light, neck is supple, trachea is midline, dried blood in nares, mucous membranes are mildly dry   5. Respiratory : Lungs are clear to auscultation bilaterally without wheezing, rhonchi, rales, no cyanosis, no increase in work of breathing or accessory muscle use   6. Cardiovascular : Heart rate normal, rhythm is regular, murmur present, but no rubs or gallops, no peripheral edema, peripheral pulses palpated   7. Gastrointestinal:  Abdomen is soft, nondistended, nontender to palpation bowel sounds active, no masses or organomegaly palpated   8. Skin:  Skin is warm, dry and intact without rashes, acute lesions, or ulcers on limited exam   9.Musculoskeletal:  No acute deformities or trauma, no asymmetry in tone, no peripheral edema, peripheral pulses palpated, no tenderness to palpation in the extremities  Data Reviewed: In the ED Patient was hypothermic, tachycardic, tachypneic with a leukopenia Sepsis protocol started Blood cultures pending Chemistry revealed a hypokalemia, elevated BUN, elevated creatinine Patient  slightly hyperglycemic at 191 UA is not negative UTI CT chest abdomen pelvis shows some rib fractures, left pleural effusion, cirrhosis, constipation, and an age-indeterminate left lateral transverse process fracture of L1 CT C-spine shows nothing acute CT head showed no acute changes Admission requested for hypothermia  Assessment and Plan: * Hypothermia - Patient's temperature is down to 94.2 - No bacterial infection identified as of yet - Blood cultures pending - No acute changes on CT head - TSH pending - Rapid fluid resuscitation could be contributing with 3 L bolus in the ED - Slow IV fluid rate to 75 mill per hour - Continue warming blanket as needed - Closely monitor temperature - Continue to monitor  SIRS (systemic inflammatory response syndrome) (HCC) - With hypothermia 94.2, tachycardia 95, tachypnea 23, leukopenia at 3.1, lactic acidosis 2.1 - UA is not indicative of UTI - Blood culture pending - Chest x-ray showed possible left lower lobe consolidation with pleural effusion however CT chest did not show that - Procalcitonin is undetectable - Patient started on Rocephin and Zithromax in the ED - Will continue antibiotic coverage until blood cultures result - Tachycardia and tachypnea may be related to agitation, control agitation with pain control and Haldol as needed  -continue to monitor  Dehydration - BUN 73, creatinine 1.53 - Baseline creatinine 1.05 - Likely due to poor p.o. intake given behavioral disturbances, but not completely clear - CT abdomen pelvis shows constipation, cirrhosis, but no acute renal pathology - Hold nephrotoxic agents when possible - 3 L bolus given in the ED - Hold Lasix - Trend labs in the a.m.  Fall - Patient from [facility] - Unwitnessed fall - CT head is without acute changes - CT C-spine is without acute changes - CT chest shows 10th, 11th, 12th rib fractures - X-ray pelvis shows no acute changes - Pain control with pain  scale - Incentive spirometer for rib  fractures - Continue to monitor  Rib fractures - Secondary to fall - CT chest shows 10th, 11th, 12th rib fractures - Incentive spirometer if patient is able to participate - Closely monitor  Dementia with behavioral disturbance (HCC) - Continue home psych medications including Depakote, BuSpar, Lexapro - Reorient as necessary - Haldol for behavioral disturbance - Continue to monitor  HFrEF (heart failure with reduced ejection fraction) (HCC) - Continue beta-blocker, Aldactone - Holding Lasix at this time - Continue to monitor  Acute kidney injury (HCC) - Creatinine baseline 1.05 - Creatinine today 1.53 - Secondary to dehydration - See plan above  Paroxysmal atrial fibrillation (HCC) - Continue home medications of Toprol and Eliquis - Patient did recently have a fall but hemoglobin stable - CT head shows no acute intracranial abnormality  Dyslipidemia - Resume Repatha at discharge  Diabetes mellitus with neurological manifestation (HCC) - 26 units of long-acting insulin at baseline - Will continue reduced rate of basal insulin here at 15 units - Sliding scale coverage - Heart healthy/carb modified diet - Continue to monitor      Advance Care Planning:   Code Status: DNR  Consults: None at this time  Family Communication: No family at bedside  Severity of Illness: The appropriate patient status for this patient is INPATIENT. Inpatient status is judged to be reasonable and necessary in order to provide the required intensity of service to ensure the patient's safety. The patient's presenting symptoms, physical exam findings, and initial radiographic and laboratory data in the context of their chronic comorbidities is felt to place them at high risk for further clinical deterioration. Furthermore, it is not anticipated that the patient will be medically stable for discharge from the hospital within 2 midnights of admission.   * I  certify that at the point of admission it is my clinical judgment that the patient will require inpatient hospital care spanning beyond 2 midnights from the point of admission due to high intensity of service, high risk for further deterioration and high frequency of surveillance required.*  Author: Lilyan Gilford, DO 09/15/2022 9:47 PM  For on call review [email or web address].

## 2022-10-10 DIAGNOSIS — T68XXXA Hypothermia, initial encounter: Secondary | ICD-10-CM | POA: Diagnosis not present

## 2022-10-10 DIAGNOSIS — R52 Pain, unspecified: Secondary | ICD-10-CM | POA: Diagnosis not present

## 2022-10-10 DIAGNOSIS — Z7189 Other specified counseling: Secondary | ICD-10-CM | POA: Diagnosis not present

## 2022-10-10 DIAGNOSIS — Z515 Encounter for palliative care: Secondary | ICD-10-CM

## 2022-10-10 DIAGNOSIS — F03918 Unspecified dementia, unspecified severity, with other behavioral disturbance: Secondary | ICD-10-CM | POA: Diagnosis not present

## 2022-10-10 LAB — CBC WITH DIFFERENTIAL/PLATELET
Abs Immature Granulocytes: 0.02 10*3/uL (ref 0.00–0.07)
Basophils Absolute: 0 10*3/uL (ref 0.0–0.1)
Basophils Relative: 0 %
Eosinophils Absolute: 0 10*3/uL (ref 0.0–0.5)
Eosinophils Relative: 0 %
HCT: 31.2 % — ABNORMAL LOW (ref 39.0–52.0)
Hemoglobin: 10 g/dL — ABNORMAL LOW (ref 13.0–17.0)
Immature Granulocytes: 0 %
Lymphocytes Relative: 10 %
Lymphs Abs: 0.5 10*3/uL — ABNORMAL LOW (ref 0.7–4.0)
MCH: 30.9 pg (ref 26.0–34.0)
MCHC: 32.1 g/dL (ref 30.0–36.0)
MCV: 96.3 fL (ref 80.0–100.0)
Monocytes Absolute: 0.8 10*3/uL (ref 0.1–1.0)
Monocytes Relative: 16 %
Neutro Abs: 3.5 10*3/uL (ref 1.7–7.7)
Neutrophils Relative %: 74 %
Platelets: 225 10*3/uL (ref 150–400)
RBC: 3.24 MIL/uL — ABNORMAL LOW (ref 4.22–5.81)
RDW: 18.5 % — ABNORMAL HIGH (ref 11.5–15.5)
WBC: 4.8 10*3/uL (ref 4.0–10.5)
nRBC: 0 % (ref 0.0–0.2)

## 2022-10-10 LAB — GLUCOSE, CAPILLARY
Glucose-Capillary: 128 mg/dL — ABNORMAL HIGH (ref 70–99)
Glucose-Capillary: 96 mg/dL (ref 70–99)
Glucose-Capillary: 96 mg/dL (ref 70–99)
Glucose-Capillary: 93 mg/dL (ref 70–99)

## 2022-10-10 LAB — T4, FREE: Free T4: 0.78 ng/dL (ref 0.61–1.12)

## 2022-10-10 MED ORDER — INSULIN ASPART 100 UNIT/ML IJ SOLN: 0.0000 [IU] | INTRAMUSCULAR | Status: DC

## 2022-10-10 MED ORDER — FENTANYL 25 MCG/HR TD PT72
1.0000 | MEDICATED_PATCH | TRANSDERMAL | Status: DC
  Administered 2022-10-10: 1 via TRANSDERMAL
  Filled 2022-10-10: qty 1

## 2022-10-10 MED ORDER — LIDOCAINE 5 % EX PTCH
1.0000 | MEDICATED_PATCH | CUTANEOUS | Status: DC
  Administered 2022-10-10: 1 via TRANSDERMAL
  Filled 2022-10-10 (×3): qty 1

## 2022-10-10 MED ORDER — BISACODYL 10 MG RE SUPP
10.0000 mg | Freq: Once | RECTAL | Status: AC
  Administered 2022-10-10: 10 mg via RECTAL
  Filled 2022-10-10: qty 1

## 2022-10-10 MED ORDER — LORAZEPAM 2 MG/ML IJ SOLN: 0.5000 mg | INTRAMUSCULAR | Status: DC | PRN

## 2022-10-10 MED ORDER — FENTANYL CITRATE PF 50 MCG/ML IJ SOSY
50.0000 ug | PREFILLED_SYRINGE | INTRAMUSCULAR | Status: DC | PRN
  Administered 2022-10-10 – 2022-10-11 (×4): 50 ug via INTRAVENOUS
  Filled 2022-10-10 (×5): qty 1

## 2022-10-10 MED ORDER — MUPIROCIN 2 % EX OINT
1.0000 | TOPICAL_OINTMENT | Freq: Two times a day (BID) | CUTANEOUS | Status: DC
  Administered 2022-10-10 – 2022-10-11 (×4): 1 via NASAL
  Filled 2022-10-10 (×2): qty 22

## 2022-10-10 MED ORDER — BISACODYL 10 MG RE SUPP: 10.0000 mg | Freq: Every day | RECTAL | Status: DC | PRN

## 2022-10-10 MED ORDER — CHLORHEXIDINE GLUCONATE CLOTH 2 % EX PADS
6.0000 | MEDICATED_PAD | Freq: Every day | CUTANEOUS | Status: DC
  Administered 2022-10-11: 6 via TOPICAL

## 2022-10-10 NOTE — Consult Note (Signed)
Consultation Note Date: 10/10/2022   Patient Name: Mark Cabrera  DOB: September 25, 1950  MRN: [id number]  Age / Sex: 72 y.o., male  PCP: Mark Doheny, MD Referring Physician: Erick Blinks, DO  Reason for Consultation: Establishing goals of care and Hospice Evaluation  HPI/Patient Profile: 72 y.o. male  with past medical history of dementia, CVA, CHF, AF, CAD, CABG, aortic valve stenosis s/p AVR, HTN, HLD, heart murmur, transaminitis, cirrhosis, sleep apnea, depression, GERD admitted on 10/08/2022 with fall with rib fractures and SIRS/hypothermia with potential pneumonia. Overall failure to thrive.   Clinical Assessment and Goals of Care: Consult received and chart review completed. I met today at [facility] bedside along with his daughter/legal guardian Mark Cabrera and his sister Mark Cabrera. We reviewed his progression of dementia and worsening over the past 2 months. They report weakness and declining appetite and 3 falls recently. He has been in horrible pain. They report that he cannot have morphine as he is allergic. He has been getting fentanyl with good relief but this does not last long.   We further discussed goals of care. We reviewed Mark Cabrera's acceptable quality of life vs where he is now. We discussed his suffering and expectations that his dementia will continue to worse. We discussed poor prognosis and concern that he will not recover from this injury and best case scenario he will be in worse shape than prior to this injury. We talked through aggressiveness of care desire vs comfort and hospice options. Ultimately family agree that they would like to work through getting him in [facility] where he can have symptom management at end of life. They wish to continue antibiotics for the next 1-2 days while they process. If no significant improvements in his cognition then they will consider transition to [facility]  as early as tomorrow (if bed available). Will continue antibiotics but no plans for further blood work or escalation of care at this time. Mark Cabrera agrees with referral to [facility] today.   I do believe that he would be a good candidate for [facility] given significant pain burden. I discussed pain plan with family outlined below. He is not eating or drinking. When awake he moans in pain.   All questions/concerns addressed. Emotional support provided. I will speak with Mark Cabrera again in the morning.   Primary Decision Maker LEGAL GUARDIAN daughter Mark Cabrera    SUMMARY OF RECOMMENDATIONS   - DNR - Comfort focused care - Continue antibiotics while hospitalized - Anticipate transition to [facility] over the next few days  Code Status/Advance Care Planning: DNR   Symptom Management:  Pain: Fentanyl patch 25 mcg/hr ordered. Fentanyl 50 mcg q2h PRN. Lidocaine patch to rib fractures.  Bowel regimen: Dulcolax supp daily PRN. Unable to take po.   Prognosis:  < 2 weeks  Discharge Planning: Hospice facility      Primary Diagnoses: Present on Admission:  Hypothermia  Acute kidney injury (HCC)  Paroxysmal atrial fibrillation (HCC)  Diabetes mellitus with neurological manifestation (HCC)  Dyslipidemia  Dementia  with behavioral disturbance (HCC)  HFrEF (heart failure with reduced ejection fraction) (HCC)   I have reviewed the medical record, interviewed the patient and family, and examined the patient. The following aspects are pertinent.  Past Medical History:  Diagnosis Date   Acute diastolic congestive heart failure (HCC)    AKI (acute kidney injury) (HCC)    Aortic valve stenosis s/p AVR 2018   Echo 2/22: Poor acoustic windows, EF 50-55, mild LVH, normal RVSF, mild MR, trivial AI, no AS, no pericardial effusion   Atrial fibrillation (HCC) - post-op CABG    04/2016 CHA2DS2VAS score = 5   Atypical nevi    Coronary artery disease s/p 2 vessel CABG    Depression    "years ago"    Diabetes mellitus    Dyspnea    in the past    GERD (gastroesophageal reflux disease)    Heart murmur    Hepatic cirrhosis (HCC) 06/29/2017   History of kidney stones    Hyperlipidemia    hx of transaminitis secondary to statin and he has decided not to use statins secondary to potential side effects.   Hypertension    Nephrolithiasis    Osteoarthritis, knee    Pericardial effusion a. subxiphoid pericardial window on 04/21/2016 04/28/2016   Peripheral neuropathic pain 05/12/2016   Sleep apnea     Central apnea. 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 2-D echo was normal no embolic source found.   Social History   Socioeconomic History   Marital status: Divorced    Spouse name: Not on file   Number of children: 1   Years of education: 2y college   Highest education level: Not on file  Occupational History   Occupation:  Medicaid /disability    Employer: UNEMPLOYED    Comment: following stroke in 2007  Tobacco Use   Smoking status: Never   Smokeless tobacco: Never  Vaping Use   Vaping status: Never Used  Substance and Sexual Activity   Alcohol use: Yes    Alcohol/week: 1.0 standard drink of alcohol    Types: 1 Cans of beer per week    Comment: occasional   Drug use: No   Sexual activity: Not Currently  Other Topics Concern   Not on file  Social History Narrative   Single but has a girlfriend.    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       Patient has medications disability post stroke.            Social Determinants of Health   Financial Resource Strain: Medium Risk (01/18/2022)    Overall Financial Resource Strain (CARDIA)    Difficulty of Paying Living Expenses: Somewhat hard  Food Insecurity: Patient Unable To Answer (09/14/2022)   Hunger Vital Sign    Worried About Running Out of Food in the Last Year: Patient unable to answer    Ran Out of Food in the Last Year: Patient unable to answer  Transportation Needs: Patient Unable To Answer (10/08/2022)   PRAPARE - Transportation    Lack of Transportation (Medical): Patient unable to answer  Lack of Transportation (Non-Medical): Patient unable to answer  Physical Activity: Inactive (01/18/2022)   Exercise Vital Sign    Days of Exercise per Week: 0 days    Minutes of Exercise per Session: 0 min  Stress: Stress Concern Present (01/18/2022)   [employer] of Occupational Health - Occupational Stress Questionnaire    Feeling of Stress : Very much  Social Connections: Moderately Isolated (01/18/2022)   Social Connection and Isolation Panel [NHANES]    Frequency of Communication with Friends and Family: More than three times a week    Frequency of Social Gatherings with Friends and Family: Once a week    Attends Religious Services: 1 to 4 times per year    Active Member of [employer] or Organizations: No    Attends Engineer, structural: Never    Marital Status: Divorced   Family History  Problem Relation Age of Onset   Hypertension Mother    Diabetes Mother    Alcohol abuse Father    Scheduled Meds:  Chlorhexidine Gluconate Cloth  6 each Topical Q0600   Chlorhexidine Gluconate Cloth  6 each Topical Q0600   divalproex  250 mg Oral BID   feeding supplement  237 mL Oral BID BM   insulin aspart  0-5 Units Subcutaneous QHS   insulin aspart  0-9 Units Subcutaneous Q4H   metoprolol succinate  12.5 mg Oral Daily   mupirocin ointment  1 Application Nasal BID   mouth rinse  15 mL Mouth Rinse 4 times per day   thiamine  100 mg Oral Daily   cyanocobalamin  100 mcg Oral Daily   Continuous Infusions:  azithromycin  Stopped (09/16/2022 1716)   cefTRIAXone (ROCEPHIN)  IV Stopped (10/08/2022 1716)   PRN Meds:.acetaminophen **OR** acetaminophen, fentaNYL (SUBLIMAZE) injection, haloperidol lactate, ipratropium-albuterol, LORazepam, ondansetron **OR** ondansetron (ZOFRAN) IV, mouth rinse Allergies  Allergen Reactions   Morpholine Salicylate Other (See Comments)    Hallucinations   Penicillins Other (See Comments)    Allergic- reaction?? Has patient had a PCN reaction causing immediate rash, facial/tongue/throat swelling, SOB or lightheadedness with hypotension: Unk Has patient had a PCN reaction causing severe rash involving mucus membranes or skin necrosis: Unk Has patient had a PCN reaction that required hospitalization: Unk Has patient had a PCN reaction occurring within the last 10 years: No If all of the above answers are "NO", then may proceed with Cephalosporin use.   Morphine And Codeine Other (See Comments)    Hallucinations    Sglt2 Inhibitors Rash and Other (See Comments)    Candidiasis infection prone   Statins Rash and Other (See Comments)    Severe rash and back pain, and muscle pain   Review of Systems  Unable to perform ROS: Acuity of condition    Physical Exam Vitals and nursing note reviewed.  Constitutional:      General: He is sleeping. He is in acute distress.     Appearance: He is ill-appearing.     Comments: When awake he moans and restless. Distress when awake.   Cardiovascular:     Rate and Rhythm: Normal rate.  Pulmonary:     Effort: No tachypnea, accessory muscle usage or respiratory distress.  Abdominal:     General: Abdomen is flat.  Neurological:     Mental Status: He is lethargic and disoriented.     Comments: Does not follow commands     Vital Signs: BP 108/78   Pulse 85   Temp 98.5 F (36.9 C) (Rectal)  Resp 15   Ht 5\' 8"  (1.727 m)   Wt 80 kg   SpO2 99%   BMI 26.82 kg/m  Pain Scale: PAINAD       SpO2: SpO2: 99 % O2 Device:SpO2: 99 % O2 Flow  Rate: .O2 Flow Rate (L/min): 3 L/min  IO: Intake/output summary:  Intake/Output Summary (Last 24 hours) at 10/10/2022 1106 Last data filed at 10/10/2022 0734 Gross per 24 hour  Intake 1715.98 ml  Output 400 ml  Net 1315.98 ml    LBM:   Baseline Weight: Weight: 76.9 kg Most recent weight: Weight: 80 kg     Palliative Assessment/Data:     Time Total: 80 min  Greater than 50%  of this time was spent counseling and coordinating care related to the above assessment and plan.  Signed by: [name], NP Palliative Medicine Team Pager # [phone] (M-F 8a-5p) Team Phone # [phone] (Nights/Weekends)

## 2022-10-10 NOTE — Progress Notes (Signed)
PROGRESS NOTE    Mark Cabrera  WUJ:[id number] DOB: December 17, 1950 DOA: 09/28/2022 PCP: Philomena Doheny, MD   Brief Narrative:    Mark Cabrera is a 72 y.o. male with medical history significant of CHF, atrial fibrillation, coronary artery disease status post two-vessel CABG, depression, dementia with behavioral disturbance, GERD, heart murmur, hyperlipidemia, hypertension, stroke, transaminitis, and more presents the ED with a chief complaint of fall.  Patient had an unwitnessed fall at [facility]  EMS was called.  Patient is on blood thinners.  Patient was admitted with hypothermia/positive SIRS criteria with no obvious infection noted.  He does have rib fractures as well and happens to be on Eliquis for atrial fibrillation.  According to daughter at bedside, he appears to be overall declining and having more frequent falls and is not eating or drinking very well.  Palliative consulted for consideration of hospice.  Continues to remain on IV antibiotics.  Assessment & Plan:   Principal Problem:   Hypothermia Active Problems:   Diabetes mellitus with neurological manifestation (HCC)   Dyslipidemia   Paroxysmal atrial fibrillation (HCC)   Acute kidney injury (HCC)   HFrEF (heart failure with reduced ejection fraction) (HCC)   Dementia with behavioral disturbance (HCC)   Rib fractures   Fall   Dehydration   SIRS (systemic inflammatory response syndrome) (HCC)  Assessment and Plan:  SIRS criteria with hypothermia likely in the setting of failure to thrive Temperature overall improving No obvious source of infection noted CT head with no acute findings TSH 5.574, check free T4 Continue IV Rocephin and azithromycin Follow blood cultures with no growth to date Procalcitonin unremarkable Appreciate palliative consultation   AKI-prerenal - BUN 73, creatinine 1.53 - Baseline creatinine 1.05 - Likely due to poor p.o. intake given behavioral disturbances, but not completely  clear - CT abdomen pelvis shows constipation, cirrhosis, but no acute renal pathology - Hold nephrotoxic agents when possible - 3 L bolus given in the ED, continue gentle IV fluid - Hold Lasix - Trend labs in the a.m.   Fall - Patient [facility] - Unwitnessed fall - CT head is without acute changes - CT C-spine is without acute changes - CT chest shows 10th, 11th, 12th rib fractures - X-ray pelvis shows no acute changes - Pain control with pain scale - Incentive spirometer for rib fractures - Continue to monitor -Discontinue Eliquis   Rib fractures - Secondary to fall - CT chest shows 10th, 11th, 12th rib fractures - Incentive spirometer if patient is able to participate - Closely monitor   Dementia with behavioral disturbance (HCC) - Continue home psych medications including Depakote -Hold BuSpar - Reorient as necessary - Haldol for behavioral disturbance as well as Ativan as needed - Continue to monitor -Appears to be progressing and will likely need hospice soon, appreciate palliative consultation   HFrEF (heart failure with reduced ejection fraction) (HCC) - Continue beta-blocker, and hold mild, as patient is not eating - Holding Lasix at this time - Continue to monitor -Continue gentle IV fluid and monitor   Acute kidney injury (HCC) - Creatinine baseline 1.05 - Creatinine today 1.43 - Secondary to dehydration - See plan above   Paroxysmal atrial fibrillation (HCC) - Continue home medications of Toprol and hold Eliquis, likely discontinue - Patient did recently have a fall but hemoglobin stable - CT head shows no acute intracranial abnormality   Dyslipidemia - Discontinue Repatha   Diabetes mellitus with neurological manifestation (HCC) - Monitor closely and  discontinue long-acting insulin as patient is not eating -SSI every 4 hours  DVT prophylaxis:Eliquis to SCDs Code Status: DNR Family Communication: Daughter, [name] at bedside 7/30 Disposition  Plan: Continue IV antibiotics and likely transition to hospice soon Status is: Inpatient Remains inpatient appropriate because: Need for IV medications   Consultants:  Palliative  Procedures:  None  Antimicrobials:  Anti-infectives (From admission, onward)    Start     Dose/Rate Route Frequency Ordered Stop   09/27/2022 1545  cefTRIAXone (ROCEPHIN) 2 g in sodium chloride 0.9 % 100 mL IVPB        2 g 200 mL/hr over 30 Minutes Intravenous Every 24 hours 09/25/2022 1538 10/14/22 1544   09/19/2022 1545  azithromycin (ZITHROMAX) 500 mg in sodium chloride 0.9 % 250 mL IVPB        500 mg 250 mL/hr over 60 Minutes Intravenous Every 24 hours 09/22/2022 1538 10/14/22 1544      Subjective: Patient seen and evaluated today with ongoing signs of discomfort and grunting.  Daughter at bedside who states patient has been falling quite frequently and not eating very much.  Objective: Vitals:   10/10/22 0600 10/10/22 0615 10/10/22 0630 10/10/22 0759  BP: 108/66 99/77 111/88   Pulse: 78 71 84   Resp: (!) 22 16 15    Temp:    (!) 96.6 F (35.9 C)  TempSrc:    Axillary  SpO2: 99% 93% 99%   Weight:      Height:        Intake/Output Summary (Last 24 hours) at 10/10/2022 0807 Last data filed at 10/10/2022 0734 Gross per 24 hour  Intake 1715.98 ml  Output 400 ml  Net 1315.98 ml   Filed Weights   09/13/2022 1235 10/10/22 0458  Weight: 76.9 kg 80 kg    Examination:  General exam: Appears mildly distressed and grunting Respiratory system: Clear to auscultation. Respiratory effort normal.  Nasal cannula oxygen Cardiovascular system: S1 & S2 heard, RRR.  Gastrointestinal system: Abdomen is soft Central nervous system: Alert and awake Extremities: No edema Skin: No significant lesions noted    Data Reviewed: I have personally reviewed following labs and imaging studies  CBC: Recent Labs  Lab 10/06/2022 1250 10/10/22 0721  WBC 3.1* 4.8  NEUTROABS  --  3.5  HGB 10.7* 10.0*  HCT 32.5* 31.2*   MCV 94.5 96.3  PLT 226 225   Basic Metabolic Panel: Recent Labs  Lab 09/27/2022 1250 10/10/22 0513  NA 132* 138  K 3.1* 4.0  CL 98 109  CO2 20* 20*  GLUCOSE 191* 136*  BUN 73* 58*  CREATININE 1.53* 1.43*  CALCIUM 8.4* 8.1*  MG  --  2.0   GFR: Estimated Creatinine Clearance: 43.8 mL/min (A) (by C-G formula based on SCr of 1.43 mg/dL (H)). Liver Function Tests: Recent Labs  Lab 09/11/2022 1250 10/10/22 0513  AST 58* 50*  ALT 44 38  ALKPHOS 88 77  BILITOT 2.6* 1.4*  PROT 8.0 7.0  ALBUMIN 3.4* 2.9*   No results for input(s): "LIPASE", "AMYLASE" in the last 168 hours. No results for input(s): "AMMONIA" in the last 168 hours. Coagulation Profile: Recent Labs  Lab 10/08/2022 1537  INR 1.7*   Cardiac Enzymes: No results for input(s): "CKTOTAL", "CKMB", "CKMBINDEX", "TROPONINI" in the last 168 hours. BNP (last 3 results) No results for input(s): "PROBNP" in the last 8760 hours. HbA1C: No results for input(s): "HGBA1C" in the last 72 hours. CBG: Recent Labs  Lab 10/05/2022 1236 09/19/2022 2134  GLUCAP 186* 153*   Lipid Profile: No results for input(s): "CHOL", "HDL", "LDLCALC", "TRIG", "CHOLHDL", "LDLDIRECT" in the last 72 hours. Thyroid Function Tests: Recent Labs    10/10/22 0513  TSH 5.574*   Anemia Panel: No results for input(s): "VITAMINB12", "FOLATE", "FERRITIN", "TIBC", "IRON", "RETICCTPCT" in the last 72 hours. Sepsis Labs: Recent Labs  Lab 09/16/2022 1250 09/21/2022 1509 09/25/2022 1634 09/26/2022 1953 09/13/2022 2244  PROCALCITON <0.10  --   --   --   --   LATICACIDVEN  --  2.1* 2.4* 2.4* 2.0*    Recent Results (from the past 240 hour(s))  Culture, blood (routine x 2)     Status: None (Preliminary result)   Collection Time: 09/26/2022  3:09 PM   Specimen: BLOOD  Result Value Ref Range Status   Specimen Description BLOOD BLOOD LEFT HAND  Final   Special Requests NONE  Final   Culture   Final    NO GROWTH < 24 HOURS Performed at [facility], [address] [place], [place] [address]    Report Status PENDING  Incomplete  Culture, blood (routine x 2)     Status: None (Preliminary result)   Collection Time: 09/26/2022  3:09 PM   Specimen: BLOOD  Result Value Ref Range Status   Specimen Description BLOOD BLOOD LEFT HAND LEFT LOWER HAND  Final   Special Requests   Final    BOTTLES DRAWN AEROBIC AND ANAEROBIC Blood Culture results may not be optimal due to an inadequate volume of blood received in culture bottles   Culture   Final    NO GROWTH < 24 HOURS Performed at [facility], [address] [place], [place] [address]    Report Status PENDING  Incomplete  SARS Coronavirus 2 by RT PCR (hospital order, performed in [facility] hospital lab) *cepheid single result test* Anterior Nasal Swab     Status: None   Collection Time: 09/29/2022  3:12 PM   Specimen: Anterior Nasal Swab  Result Value Ref Range Status   SARS Coronavirus 2 by RT PCR NEGATIVE NEGATIVE Final    Comment: (NOTE) SARS-CoV-2 target nucleic acids are NOT DETECTED.  The SARS-CoV-2 RNA is generally detectable in upper and lower respiratory specimens during the acute phase of infection. The lowest concentration of SARS-CoV-2 viral copies this assay can detect is 250 copies / mL. A negative result does not preclude SARS-CoV-2 infection and should not be used as the sole basis for treatment or other patient management decisions.  A negative result may occur with improper specimen collection / handling, submission of specimen other than nasopharyngeal swab, presence of viral mutation(s) within the areas targeted by this assay, and inadequate number of viral copies (<250 copies / mL). A negative result must be combined with clinical observations, patient history, and epidemiological information.  Fact Sheet for Patients:   RoadLapTop.co.za  Fact Sheet for Healthcare Providers: http://kim-miller.com/  This test is not yet approved or   cleared by the [place] FDA and has been authorized for detection and/or diagnosis of SARS-CoV-2 by FDA under an Emergency Use Authorization (EUA).  This EUA will remain in effect (meaning this test can be used) for the duration of the COVID-19 declaration under Section 564(b)(1) of the Act, 21 U.S.C. section 360bbb-3(b)(1), unless the authorization is terminated or revoked sooner.  Performed at [facility], [address] [place], [place] [address]   MRSA Next Gen by PCR, Nasal     Status: Abnormal   Collection Time: 10/10/2022  5:48 PM   Specimen: Nasal Mucosa; Nasal Swab  Result Value Ref Range Status   MRSA by PCR Next Gen DETECTED (A) NOT DETECTED Final    Comment: RESULT CALLED TO, READ BACK BY AND VERIFIED WITH: CODY KINLEY AT 0019 10/10/2022 BY T KENNEDY (NOTE) The GeneXpert MRSA Assay (FDA approved for NASAL specimens only), is one component of a comprehensive MRSA colonization surveillance program. It is not intended to diagnose MRSA infection nor to guide or monitor treatment for MRSA infections. Test performance is not FDA approved in patients less than 80 years old. Performed at [facility], [address], [place], [place] [address]          Radiology Studies: CT CHEST ABDOMEN PELVIS W CONTRAST  Result Date: 09/24/2022 CLINICAL DATA:  Status post fall. EXAM: CT CHEST, ABDOMEN, AND PELVIS WITH CONTRAST TECHNIQUE: Multidetector CT imaging of the chest, abdomen and pelvis was performed following the standard protocol during bolus administration of intravenous contrast. RADIATION DOSE REDUCTION: This exam was performed according to the departmental dose-optimization program which includes automated exposure control, adjustment of the mA and/or kV according to patient size and/or use of iterative reconstruction technique. CONTRAST:  80mL OMNIPAQUE IOHEXOL 300 MG/ML  SOLN COMPARISON:  February 27, 2022 FINDINGS: CT CHEST FINDINGS Cardiovascular: There is moderate to  marked severity calcification of the aortic arch, without evidence of aortic aneurysm. There is mild cardiomegaly with marked severity coronary artery calcification. 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 A metallic density atrial appendage clip is also noted. No pericardial effusion. Mediastinum/Nodes: No enlarged mediastinal, hilar, or axillary lymph nodes. Thyroid gland, trachea, and esophagus demonstrate no significant findings. Lungs/Pleura: Mild posterior left upper lobe and mild bilateral lower lobe atelectasis is seen. There is a large left pleural effusion. No pneumothorax is identified. Musculoskeletal: Acute posterior tenth, eleventh and twelfth left rib fractures are seen. CT ABDOMEN PELVIS FINDINGS Hepatobiliary: The liver is cirrhotic in appearance. The gallbladder is partially contracted, without evidence of gallstones, gallbladder wall thickening or pericholecystic inflammation. Pancreas: Unremarkable. No pancreatic ductal dilatation or surrounding inflammatory changes. Spleen: Normal in size without focal abnormality. Adrenals/Urinary Tract: Adrenal glands are unremarkable. Kidneys are normal, without renal calculi, focal lesion, or hydronephrosis. Bladder is unremarkable. Stomach/Bowel: Stomach is within normal limits. The appendix is not clearly identified. A moderate to large amount of stool is seen within the distal sigmoid colon and rectum. No evidence of bowel wall thickening, distention, or inflammatory changes. Vascular/Lymphatic: Aortic atherosclerosis. No enlarged abdominal or pelvic lymph nodes. Reproductive: There is mild to moderate severity prostatomegaly. Other: A 3.0 cm x 2.0 cm fat containing left inguinal hernia is seen. No abdominopelvic ascites. Musculoskeletal: A fracture deformity of indeterminate age is seen involving the left lateral transverse process of L1. Bilateral pars defects are seen at the level of L5. Very small, linear, nondisplaced cortical irregularities of  indeterminate age are seen along the anterior aspect of the lower sacrum. Multilevel degenerative changes seen throughout the lumbar spine IMPRESSION: 1. Acute posterior tenth, eleventh and twelfth left rib fractures. 2. Large left pleural effusion. 3. Mild posterior left upper lobe and mild bilateral lower lobe atelectasis. 4. Cirrhotic liver. 5. Moderate to large amount of stool within the distal sigmoid colon and rectum. 6. 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 7. Fracture deformity of indeterminate age involving the left lateral transverse process of L1. 8. Aortic atherosclerosis. Aortic Atherosclerosis (ICD10-I70.0). Electronically Signed   By: Aram Candela M.D.   On: 09/24/2022 16:48  CT Cervical Spine Wo Contrast  Result Date: 10/01/2022 CLINICAL DATA:  Status post fall. EXAM: CT CERVICAL SPINE WITHOUT CONTRAST TECHNIQUE: Multidetector CT imaging of the cervical spine was performed without intravenous contrast. Multiplanar CT image reconstructions were also generated. RADIATION DOSE REDUCTION: This exam was performed according to the departmental dose-optimization program which includes automated exposure control, adjustment of the mA and/or kV according to patient size and/or use of iterative reconstruction technique. COMPARISON:  November 08, 2020 FINDINGS: Alignment: Normal. Skull base and vertebrae: No acute fracture. No primary bone lesion or focal pathologic process. Soft tissues and spinal canal: No prevertebral fluid or swelling. No visible canal hematoma. Disc levels: Mild multilevel endplate sclerosis is seen throughout all levels of the cervical spine. Moderate severity endplate sclerosis and mild anterior osteophyte formation are also seen at the level of C7-T1. Moderate severity posterior bony spurring is seen at C4-C5. There is mild multilevel intervertebral disc space narrowing. Bilateral mild-to-moderate severity multilevel facet joint hypertrophy is noted. Upper chest: A large  pleural effusion is seen within the left apex. Other: It should be noted that the study is limited secondary to patient motion. IMPRESSION: 1. Limited study secondary to patient motion. 2. No evidence of an acute fracture or subluxation. 3. Mild to moderate severity multilevel degenerative changes, as described above. 4. Large left pleural effusion. Electronically Signed   By: Aram Candela M.D.   On: 09/27/2022 16:34   CT Head Wo Contrast  Result Date: 09/15/2022 CLINICAL DATA:  Unwitnessed fall. EXAM: CT HEAD WITHOUT CONTRAST TECHNIQUE: Contiguous axial images were obtained from the base of the skull through the vertex without intravenous contrast. RADIATION DOSE REDUCTION: This exam was performed according to the departmental dose-optimization program which includes automated exposure control, adjustment of the mA and/or kV according to patient size and/or use of iterative reconstruction technique. COMPARISON:  Jul 29, 2022 FINDINGS: Brain: There is mild to moderate severity cerebral atrophy with widening of the extra-axial spaces and ventricular dilatation. There are areas of decreased attenuation within the white matter tracts of the supratentorial brain, consistent with microvascular disease changes. There is a small, chronic left occipital lobe infarct. Vascular: No hyperdense vessel or unexpected calcification. Skull: Normal. Negative for fracture or focal lesion. Sinuses/Orbits: Postoperative changes are seen involving globe. Other: None. IMPRESSION: 1. Generalized cerebral atrophy with chronic white matter small vessel ischemic changes. 2. Small, chronic left occipital lobe infarct. 3. No acute intracranial abnormality. Electronically Signed   By: Aram Candela M.D.   On: 09/11/2022 16:30   DG Pelvis Portable  Result Date: 10/04/2022 CLINICAL DATA:  fall pain EXAM: PORTABLE PELVIS 1-2 VIEWS COMPARISON:  07/29/2022. FINDINGS: Pelvis is intact with normal and symmetric sacroiliac joints. No  acute fracture or dislocation. No aggressive osseous lesion. Visualized sacral arcuate lines are unremarkable. Unremarkable symphysis pubis. There are mild degenerative changes of bilateral hip joints without significant joint space narrowing. Osteophytosis of the superior acetabulum. No radiopaque foreign bodies. IMPRESSION: No acute fracture or dislocation. Mild degenerative changes of bilateral hip joints. Electronically Signed   By: Jules Schick M.D.   On: 09/21/2022 14:04   DG Chest Port 1 View  Result Date: 09/13/2022 CLINICAL DATA:  fall pain EXAM: PORTABLE CHEST 1 VIEW COMPARISON:  08/09/2022. FINDINGS: Low lung volume. Mild diffuse interstitial prominence. It is uncertain whether this represents artifactual "crowding" of the interstitium related to suboptimal inspiration versus pulmonary vascular congestion. Redemonstration of left retrocardiac airspace opacity obscuring the left hemidiaphragm, descending thoracic aorta and blunting the  left lateral costophrenic angle suggesting combination of left lower lobe atelectasis and/or consolidation with pleural effusion. No significant interval change since the prior study. Bilateral lungs are otherwise clear. Right lateral costophrenic angle is clear. Stable mildly enlarged cardio-mediastinal silhouette. No acute osseous abnormalities. The soft tissues are within normal limits. IMPRESSION: *Left lower lobe atelectasis and/or consolidation with pleural effusion. *Mild diffuse interstitial prominence. It is uncertain whether this represents artifactual "crowding" of the interstitium related to suboptimal inspiration versus pulmonary vascular congestion. Electronically Signed   By: Jules Schick M.D.   On: 09/25/2022 14:03        Scheduled Meds:  apixaban  5 mg Oral BID   busPIRone  7.5 mg Oral BID   Chlorhexidine Gluconate Cloth  6 each Topical Q0600   Chlorhexidine Gluconate Cloth  6 each Topical Q0600   divalproex  250 mg Oral BID    escitalopram  10 mg Oral Daily   feeding supplement  237 mL Oral BID BM   insulin aspart  0-15 Units Subcutaneous TID WC   insulin aspart  0-5 Units Subcutaneous QHS   insulin glargine-yfgn  15 Units Subcutaneous QHS   metoprolol succinate  12.5 mg Oral Daily   mupirocin ointment  1 Application Nasal BID   mouth rinse  15 mL Mouth Rinse 4 times per day   spironolactone  12.5 mg Oral Daily   thiamine  100 mg Oral Daily   cyanocobalamin  100 mcg Oral Daily   Continuous Infusions:  azithromycin Stopped (09/21/2022 1716)   cefTRIAXone (ROCEPHIN)  IV Stopped (09/15/2022 1716)     LOS: 1 day    Time spent: 35 minutes    Larose Batres Hoover Brunette, DO [facility]  If 7PM-7AM, please contact night-coverage [email or web address] 10/10/2022, 8:07 AM

## 2022-10-10 NOTE — TOC Initial Note (Signed)
Transition of Care [facility]) - Initial/Assessment Note    Patient Details  Name: Mark Cabrera MRN: [id number] Date of Birth: 05-12-1950  Transition of Care [facility]) CM/SW Contact:    Annice Needy, LCSW Phone Number: 10/10/2022, 1:19 PM  Clinical Narrative:                 Patient from [facility]. Considered high risk for readmission. Palliative involved, indicated that patient's daughter and sister want to work towards patient going to [employer]. Referral made to [facility] with [facility].   Expected Discharge Plan: Hospice Medical Facility Barriers to Discharge: Continued Medical Work up   Patient Goals and CMS Choice Patient states their goals for this hospitalization and ongoing recovery are:: family is considering Psychologist, sport and exercise          Expected Discharge Plan and Services                                              Prior Living Arrangements/Services                       Activities of Daily Living Home Assistive Devices/Equipment: Environmental consultant (specify type), Shower chair with back, CBG Meter, Grab bars around toilet, Grab bars in shower ADL Screening (condition at time of admission) Patient's cognitive ability adequate to safely complete daily activities?: No Does the patient have difficulty concentrating, remembering, or making decisions?: Yes Patient able to express need for assistance with ADLs?: No Does the patient have difficulty dressing or bathing?: Yes Independently performs ADLs?: No Communication: Appropriate for developmental age Dressing (OT): Needs assistance Is this a change from baseline?: Pre-admission baseline Grooming: Needs assistance Is this a change from baseline?: Pre-admission baseline Feeding: Needs assistance Is this a change from baseline?: Pre-admission baseline Bathing: Needs assistance Is this a change from baseline?: Pre-admission baseline Toileting: Needs assistance Is this a change from baseline?: Pre-admission  baseline In/Out Bed: Independent with device (comment) (uses walker per [place] LPN at [facility]) Theresa in Home: Independent with device (comment) Does the patient have difficulty walking or climbing stairs?: Yes Weakness of Legs: None Weakness of Arms/Hands: None  Permission Sought/Granted                  Emotional Assessment              Admission diagnosis:  Hypothermia [T68.XXXA] Hypothermia, initial encounter [T68.XXXA] Injury of head, initial encounter [S09.90XA] Fall, initial encounter [W19.XXXA] Patient Active Problem List   Diagnosis Date Noted   Hypothermia 09/20/2022   Rib fractures 09/29/2022   Fall 10/06/2022   Dehydration 09/26/2022   SIRS (systemic inflammatory response syndrome) (HCC) 09/29/2022   Aortic stenosis 09/05/2022   Acute on chronic systolic CHF (congestive heart failure) (HCC) 08/03/2022   Acute on chronic combined systolic and diastolic CHF (congestive heart failure) (HCC) 08/02/2022   Dementia with behavioral disturbance (HCC) 08/02/2022   Acute systolic (congestive) heart failure (HCC) 08/02/2022   Palliative care encounter 04/08/2022   Hypomagnesemia 04/08/2022   Aggressive behavior 03/18/2022   HFrEF (heart failure with reduced ejection fraction) (HCC) 02/28/2022   Acute venous stasis dermatitis of both lower extremities 02/27/2022   Acute on chronic diastolic heart failure with preserved ejection fraction (HCC) 02/26/2022   Pressure injury of skin 01/24/2022   Hyperglycemia 01/17/2022   Neutropenia (HCC)    Fever, unspecified  Malnutrition of moderate degree 09/23/2021   Physical deconditioning 09/21/2021   Otitis externa 12/31/2020   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   Balanitis 11/19/2020   Complicated urinary tract infection    Acute kidney injury (HCC)    Thrombocytopenia (HCC)    Hyponatremia 11/08/2020   Left shoulder pain 10/20/2020   Dyspnea 09/23/2020   Cognitive impairment with history of delirium 08/25/2020   Hip  pain, chronic, left 07/05/2020   Epididymitis 04/14/2020   Fatigue 03/18/2020   Hypotension 03/10/2020   Abdominal wall bulge 12/03/2019   Dermoid inclusion cyst 08/07/2019   Chronic left shoulder pain 05/13/2019   Depression 11/05/2018   Blurry vision, bilateral 09/20/2018   CVA (cerebral vascular accident) (HCC) 06/28/2018   Hepatic cirrhosis (HCC) 06/29/2017   Healthcare maintenance 12/16/2016   Lipoma of left upper extremity 09/12/2016   Chronic knee pain 07/04/2016   Decreased visual acuity 07/04/2016   Pleural effusion, left 04/28/2016   Paroxysmal atrial fibrillation (HCC) 04/16/2016   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/31/2016   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   CAD (coronary artery disease), native coronary artery    Chronic diastolic heart failure (HCC)    Sleep apnea 08/02/2009   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   Dyslipidemia 05/18/2006   Hypertensive heart disease    PCP:  Philomena Doheny, MD Pharmacy:   [facility] - [place], [place] - [address] [place] [address] Phone: [phone] Fax: [phone]  [id number] - [place], [place] - [address] AT [facility] [address] [place] [address] Phone: [phone] Fax: [phone]     Social Determinants of Health (SDOH) Social History: SDOH Screenings   Food Insecurity: Patient Unable To Answer (09/29/2022)  Housing: High Risk (09/18/2022)  Transportation Needs: Patient Unable To Answer (09/11/2022)  Utilities: Patient Unable To Answer (09/21/2022)  Alcohol Screen: Low Risk  (01/18/2022)  Depression (PHQ2-9): Low Risk  (01/18/2022)  Financial Resource Strain: Medium Risk (01/18/2022)  Physical Activity: Inactive (01/18/2022)  Social Connections: Moderately Isolated (01/18/2022)  Stress: Stress Concern Present (01/18/2022)  Tobacco Use: Low Risk  (09/19/2022)    SDOH Interventions:     Readmission Risk Interventions     No data to display

## 2022-10-10 NOTE — Progress Notes (Signed)
[facility] Liaison Note  Referral received for patient/family interest in [facility]. Per PMT, they are not ready to move patient until tomorrow.   [facility] liaison will follow up with family tomorrow.   Please call with any questions or concerns. Thank you  Dionicio Stall, [facility] Liaison [phone]

## 2022-10-11 DIAGNOSIS — F03918 Unspecified dementia, unspecified severity, with other behavioral disturbance: Secondary | ICD-10-CM | POA: Diagnosis not present

## 2022-10-11 DIAGNOSIS — Z7189 Other specified counseling: Secondary | ICD-10-CM | POA: Diagnosis not present

## 2022-10-11 DIAGNOSIS — I5020 Unspecified systolic (congestive) heart failure: Secondary | ICD-10-CM | POA: Diagnosis not present

## 2022-10-11 DIAGNOSIS — E860 Dehydration: Secondary | ICD-10-CM | POA: Diagnosis not present

## 2022-10-11 DIAGNOSIS — T68XXXD Hypothermia, subsequent encounter: Secondary | ICD-10-CM

## 2022-10-11 DIAGNOSIS — Z515 Encounter for palliative care: Secondary | ICD-10-CM | POA: Diagnosis not present

## 2022-10-11 LAB — GLUCOSE, CAPILLARY
Glucose-Capillary: 142 mg/dL — ABNORMAL HIGH (ref 70–99)
Glucose-Capillary: 162 mg/dL — ABNORMAL HIGH (ref 70–99)
Glucose-Capillary: 66 mg/dL — ABNORMAL LOW (ref 70–99)
Glucose-Capillary: 86 mg/dL (ref 70–99)
Glucose-Capillary: 90 mg/dL (ref 70–99)

## 2022-10-11 MED ORDER — HYDROMORPHONE HCL 1 MG/ML IJ SOLN
1.0000 mg | INTRAMUSCULAR | Status: DC | PRN
  Administered 2022-10-11: 1 mg via INTRAVENOUS

## 2022-10-11 MED ORDER — HYDROMORPHONE HCL 1 MG/ML IJ SOLN
2.0000 mg | INTRAMUSCULAR | Status: DC
  Administered 2022-10-11 – 2022-10-12 (×4): 2 mg via INTRAVENOUS
  Filled 2022-10-11 (×5): qty 2

## 2022-10-11 MED ORDER — LORAZEPAM 2 MG/ML IJ SOLN: 1.0000 mg | INTRAMUSCULAR | Status: DC | PRN

## 2022-10-11 MED ORDER — GLYCOPYRROLATE 0.2 MG/ML IJ SOLN: 0.2000 mg | INTRAMUSCULAR | Status: DC | PRN

## 2022-10-11 MED ORDER — HYDROMORPHONE HCL 1 MG/ML IJ SOLN: 2.0000 mg | INTRAMUSCULAR | Status: DC

## 2022-10-11 MED ORDER — GLYCOPYRROLATE 1 MG PO TABS: 1.0000 mg | ORAL_TABLET | ORAL | Status: DC | PRN

## 2022-10-11 MED ORDER — BIOTENE DRY MOUTH MT LIQD: 15.0000 mL | OROMUCOSAL | Status: DC | PRN

## 2022-10-11 MED ORDER — HYDROMORPHONE HCL 1 MG/ML IJ SOLN: 1.0000 mg | INTRAMUSCULAR | Status: DC

## 2022-10-11 MED ORDER — GLYCOPYRROLATE 0.2 MG/ML IJ SOLN
0.6000 mg | INTRAMUSCULAR | Status: DC
  Administered 2022-10-11 – 2022-10-12 (×5): 0.6 mg via INTRAVENOUS
  Filled 2022-10-11 (×5): qty 3

## 2022-10-11 MED ORDER — HYDROMORPHONE HCL 1 MG/ML IJ SOLN: 1.0000 mg | INTRAMUSCULAR | Status: DC | PRN

## 2022-10-11 MED ORDER — DEXTROSE 50 % IV SOLN
INTRAVENOUS | Status: AC
  Administered 2022-10-11: 50 mL
  Filled 2022-10-11: qty 50

## 2022-10-11 MED ORDER — SCOPOLAMINE 1 MG/3DAYS TD PT72
1.0000 | MEDICATED_PATCH | TRANSDERMAL | Status: DC
  Administered 2022-10-11: 1.5 mg via TRANSDERMAL
  Filled 2022-10-11: qty 1

## 2022-10-11 MED ORDER — GLYCOPYRROLATE 0.2 MG/ML IJ SOLN
0.2000 mg | INTRAMUSCULAR | Status: DC | PRN
  Administered 2022-10-11: 0.2 mg via INTRAVENOUS
  Filled 2022-10-11: qty 1

## 2022-10-11 MED ORDER — HYDROMORPHONE HCL 1 MG/ML IJ SOLN
1.0000 mg | INTRAMUSCULAR | Status: DC
  Filled 2022-10-11: qty 1

## 2022-10-11 MED ORDER — HYDROMORPHONE HCL 1 MG/ML IJ SOLN
2.0000 mg | INTRAMUSCULAR | Status: DC | PRN
  Administered 2022-10-11: 2 mg via INTRAVENOUS

## 2022-10-11 MED ORDER — POLYVINYL ALCOHOL 1.4 % OP SOLN: 1.0000 [drp] | Freq: Four times a day (QID) | OPHTHALMIC | Status: DC | PRN

## 2022-10-11 NOTE — Progress Notes (Signed)
Palliative:  HPI:  72 y.o. male  with past medical history of dementia, CVA, CHF, AF, CAD, CABG, aortic valve stenosis s/p AVR, HTN, HLD, heart murmur, transaminitis, cirrhosis, sleep apnea, depression, GERD admitted on 09/24/2022 with fall with rib fractures and SIRS/hypothermia with potential pneumonia. Overall failure to thrive.   I met today at Mark Cabrera bedside. I discussed with daughter, Mark Cabrera. Mark Cabrera has had significant decline overnight. He has significant copious terminal secretions, labored breathing, and appears in pain. He has received no pain medication since 0451 am. I spoke with Mark Cabrera about his progression towards end of life. I explained that I am concerned that his prognosis is < 24 hours. She has spoken with [facility] about transfer today but after our discussion we agree that he is not stable for transfer and we will maintain him here to keep him comfortable and allow peaceful death. We discussed comfort care. We discussed signs and transitions at end of life. We discussed final arrangements and she knows they plan for cremation but do not know who they plan to work with for final arrangements. I also discussed with sister, Mark Cabrera, and she has had experience with other loved ones and recognizes he is at end of life. Support and assistance to adjust symptom management regimen provided during multiple visits throughout the day.   All questions/concerns addressed. Emotional support provided.   Exam: Withdraws to pain. Tense, discomfort. Moaning. Restless. Breathing labored with accessory muscle usage. Copious terminal secretions. Warm to touch.   Plan: - Full comfort care - Anticipate hospital death likely within the next 24 hours  65 min  [name], NP Palliative Medicine Team Pager [phone] (Please see [email or web address] for schedule) Team Phone [phone]    Greater than 50%  of this time was spent counseling and coordinating care related to the above assessment and plan

## 2022-10-11 NOTE — Progress Notes (Signed)
PROGRESS NOTE   Mark Cabrera  ZOX:[id number] DOB: 12-14-1950 DOA: 10/11/2022 PCP: Philomena Doheny, MD   Chief Complaint  Patient presents with   Fall   Level of care: Med-Surg  Brief Admission History:  72 y.o. male with medical history significant of CHF, atrial fibrillation, coronary artery disease status post two-vessel CABG, depression, dementia with behavioral disturbance, GERD, heart murmur, hyperlipidemia, hypertension, stroke, transaminitis, and more presents the ED with a chief complaint of fall.  Patient had an unwitnessed fall at [facility].  EMS was called.  Patient is on blood thinners.  Patient was admitted with hypothermia/positive SIRS criteria with no obvious infection noted.  He does have rib fractures as well and happens to be on Eliquis for atrial fibrillation.  According to daughter at bedside, he appears to be overall declining and having more frequent falls and is not eating or drinking very well.  Palliative consulted for consideration of hospice.  Continues to remain on IV antibiotics.    Assessment and Plan: Principal Problem:   Hypothermia Active Problems:   Diabetes mellitus with neurological manifestation (HCC)   Dyslipidemia   Paroxysmal atrial fibrillation (HCC)   Acute kidney injury (HCC)   HFrEF (heart failure with reduced ejection fraction) (HCC)   Dementia with behavioral disturbance (HCC)   Rib fractures   Fall   Dehydration   SIRS (systemic inflammatory response syndrome) (HCC)  Pt has been transitioned to full comfort measures.  Please see orders.  Discussed with daughter at bedside.    DVT prophylaxis: Full comfort care  Code Status: DNR  Family Communication: daughter at bedside  Disposition: hospice/full comfort care   Consultants:  Palliative care   Procedures:   Antimicrobials:    Subjective: Pt appears terminally ill with labored breathing.   Objective: Vitals:   10/11/22 0350 10/11/22 0453 10/11/22 0836 10/11/22  1433  BP:  103/74 103/86 (!) 94/54  Pulse: 81 67 76 (!) 101  Resp:  16  15  Temp: 98.6 F (37 C) 98.2 F (36.8 C) 97.9 F (36.6 C)   TempSrc: Axillary Oral Axillary   SpO2:  95% 96% 97%  Weight:      Height:        Intake/Output Summary (Last 24 hours) at 10/11/2022 1606 Last data filed at 10/10/2022 1613 Gross per 24 hour  Intake 100 ml  Output 400 ml  Net -300 ml   Filed Weights   09/12/2022 1235 10/10/22 0458  Weight: 76.9 kg 80 kg   Examination:  General exam: Appears terminally ill.   Respiratory system: diffuse rales and crackles. Cardiovascular system: normal S1 & S2 heard.  Gastrointestinal system: Abdomen is nondistended, soft and nontender. No organomegaly or masses felt. Normal bowel sounds heard. Central nervous system: somnolent. Extremities: moving spontaneously. Skin: age related changes seen. Psychiatry: UTD due to somnolence, end of life.   Data Reviewed: I have personally reviewed following labs and imaging studies  CBC: Recent Labs  Lab 10/07/2022 1250 10/10/22 0721  WBC 3.1* 4.8  NEUTROABS  --  3.5  HGB 10.7* 10.0*  HCT 32.5* 31.2*  MCV 94.5 96.3  PLT 226 225    Basic Metabolic Panel: Recent Labs  Lab 10/08/2022 1250 10/10/22 0513  NA 132* 138  K 3.1* 4.0  CL 98 109  CO2 20* 20*  GLUCOSE 191* 136*  BUN 73* 58*  CREATININE 1.53* 1.43*  CALCIUM 8.4* 8.1*  MG  --  2.0    CBG: Recent Labs  Lab 10/11/22  0010 10/11/22 0348 10/11/22 0748 10/11/22 0947 10/11/22 1121  GLUCAP 90 86 66* 162* 142*    Recent Results (from the past 240 hour(s))  Culture, blood (routine x 2)     Status: None (Preliminary result)   Collection Time: 09/19/2022  3:09 PM   Specimen: BLOOD  Result Value Ref Range Status   Specimen Description BLOOD BLOOD LEFT HAND  Final   Special Requests NONE  Final   Culture   Final    NO GROWTH 2 DAYS Performed at [facility], [address], [place], [place] [address]    Report Status PENDING  Incomplete  Culture,  blood (routine x 2)     Status: None (Preliminary result)   Collection Time: 09/18/2022  3:09 PM   Specimen: BLOOD  Result Value Ref Range Status   Specimen Description BLOOD BLOOD LEFT HAND LEFT LOWER HAND  Final   Special Requests   Final    BOTTLES DRAWN AEROBIC AND ANAEROBIC Blood Culture results may not be optimal due to an inadequate volume of blood received in culture bottles   Culture   Final    NO GROWTH 2 DAYS Performed at [facility], [address], [place], [place] [address]    Report Status PENDING  Incomplete  SARS Coronavirus 2 by RT PCR (hospital order, performed in [facility] hospital lab) *cepheid single result test* Anterior Nasal Swab     Status: None   Collection Time: 09/27/2022  3:12 PM   Specimen: Anterior Nasal Swab  Result Value Ref Range Status   SARS Coronavirus 2 by RT PCR NEGATIVE NEGATIVE Final    Comment: (NOTE) SARS-CoV-2 target nucleic acids are NOT DETECTED.  The SARS-CoV-2 RNA is generally detectable in upper and lower respiratory specimens during the acute phase of infection. The lowest concentration of SARS-CoV-2 viral copies this assay can detect is 250 copies / mL. A negative result does not preclude SARS-CoV-2 infection and should not be used as the sole basis for treatment or other patient management decisions.  A negative result may occur with improper specimen collection / handling, submission of specimen other than nasopharyngeal swab, presence of viral mutation(s) within the areas targeted by this assay, and inadequate number of viral copies (<250 copies / mL). A negative result must be combined with clinical observations, patient history, and epidemiological information.  Fact Sheet for Patients:   RoadLapTop.co.za  Fact Sheet for Healthcare Providers: http://kim-miller.com/  This test is not yet approved or  cleared by the [place] FDA and has been authorized for detection and/or  diagnosis of SARS-CoV-2 by FDA under an Emergency Use Authorization (EUA).  This EUA will remain in effect (meaning this test can be used) for the duration of the COVID-19 declaration under Section 564(b)(1) of the Act, 21 U.S.C. section 360bbb-3(b)(1), unless the authorization is terminated or revoked sooner.  Performed at [facility], [address] [place], [place] [address]   MRSA Next Gen by PCR, Nasal     Status: Abnormal   Collection Time: 10/10/2022  5:48 PM   Specimen: Nasal Mucosa; Nasal Swab  Result Value Ref Range Status   MRSA by PCR Next Gen DETECTED (A) NOT DETECTED Final    Comment: RESULT CALLED TO, READ BACK BY AND VERIFIED WITH: CODY KINLEY AT 0019 10/10/2022 BY T KENNEDY (NOTE) The GeneXpert MRSA Assay (FDA approved for NASAL specimens only), is one component of a comprehensive MRSA colonization surveillance program. It is not intended to diagnose MRSA infection nor to  guide or monitor treatment for MRSA infections. Test performance is not FDA approved in patients less than 84 years old. Performed at [facility], [address], [place], [place] [address]      Radiology Studies: No results found.  Scheduled Meds:  Chlorhexidine Gluconate Cloth  6 each Topical Q0600   Chlorhexidine Gluconate Cloth  6 each Topical Q0600   feeding supplement  237 mL Oral BID BM   fentaNYL  1 patch Transdermal Q72H   glycopyrrolate  0.6 mg Intravenous Q4H    HYDROmorphone (DILAUDID) injection  2 mg Intravenous Q4H   mupirocin ointment  1 Application Nasal BID   mouth rinse  15 mL Mouth Rinse 4 times per day   scopolamine  1 patch Transdermal Q72H   Continuous Infusions:   LOS: 2 days   Time spent: 35 mins  Denetria Luevanos Laural Benes, MD How to contact the [facility] Attending or Consulting provider 7A - 7P or covering provider during after hours 7P -7A, for this patient?  Check the care team in [facility] and look for a) attending/consulting TRH provider listed and b) the [facility] team listed Log  into [email or web address] and use [place]'s universal password to access. If you do not have the password, please contact the hospital operator. Locate the [facility] provider you are looking for under [facility] and page to a number that you can be directly reached. If you still have difficulty reaching the provider, please page the [facility] (Director on Call) for the Hospitalists listed on amion for assistance.  10/11/2022, 4:06 PM

## 2022-10-11 NOTE — Progress Notes (Signed)
   10/11/22 0836  Vitals  Temp 97.9 F (36.6 C)  Temp Source Axillary  BP 103/86  MAP (mmHg) 93  BP Location Left Arm  BP Method Automatic  Patient Position (if appropriate) Lying  Pulse Rate 76  Pulse Rate Source Dinamap  MEWS COLOR  MEWS Score Color Yellow  Oxygen Therapy  SpO2 96 %  O2 Device Nasal Cannula  O2 Flow Rate (L/min) 2 L/min  MEWS Score  MEWS Temp 0  MEWS Systolic 0  MEWS Pulse 0  MEWS RR 0  MEWS LOC 2  MEWS Score 2  Provider Notification  Provider Name/Title Dr Laural Benes  Date Provider Notified 10/11/22  Time Provider Notified [id number]  Method of Notification Face-to-face  Notification Reason Critical Result (CBG 66)  Test performed and critical result CBG  Date Critical Result Received 10/11/22  Time Critical Result Received 0820  Provider response At bedside  Date of Provider Response 10/11/22  Time of Provider Response [phone]

## 2022-10-11 NOTE — Progress Notes (Addendum)
Patient lethargic this am ,respond to pain,moans out, audible coarse crackles noted. Patient unable to follow simple command Plan of care on going. Family at the bedside.

## 2022-10-11 NOTE — Progress Notes (Signed)
Mark Cabrera [address]  Hospice hospital liaison note   Referral received for [facility]. Met in room with patient and daughter services were explained. Patient resting with eyes closed with audible secretions noted throughout visit.     Patient is approved for [facility] and bed available for offer however at this time medical team does not believe he is stable to leave the hospital.    Liaison will remain available and be able to provide assistance as needed.   Thank you for the opportunity to participate in this patient's care Thea Gist, BSN, RN Hospice nurse liaison [phone]

## 2022-10-11 NOTE — Hospital Course (Signed)
72 y.o. male with medical history significant of CHF, atrial fibrillation, coronary artery disease status post two-vessel CABG, depression, dementia with behavioral disturbance, GERD, heart murmur, hyperlipidemia, hypertension, stroke, transaminitis, and more presents the ED with a chief complaint of fall.  Patient had an unwitnessed fall at [facility].  EMS was called.  Patient is on blood thinners.  Patient was admitted with hypothermia/positive SIRS criteria with no obvious infection noted.  He does have rib fractures as well and happens to be on Eliquis for atrial fibrillation.  According to daughter at bedside, he appears to be overall declining and having more frequent falls and is not eating or drinking very well.  Palliative consulted for consideration of hospice.  Continues to remain on IV antibiotics.

## 2022-10-12 ENCOUNTER — Encounter (HOSPITAL_COMMUNITY): Payer: Self-pay | Admitting: Family Medicine

## 2022-10-12 DIAGNOSIS — S2242XD Multiple fractures of ribs, left side, subsequent encounter for fracture with routine healing: Secondary | ICD-10-CM

## 2022-10-12 DIAGNOSIS — I5020 Unspecified systolic (congestive) heart failure: Secondary | ICD-10-CM | POA: Diagnosis not present

## 2022-10-12 DIAGNOSIS — T68XXXD Hypothermia, subsequent encounter: Secondary | ICD-10-CM | POA: Diagnosis not present

## 2022-10-12 DEATH — deceased

## 2022-11-12 NOTE — Progress Notes (Signed)
Patient without pulse and respirations.  Patient had expired at [id number].  Verified by 2 RNs.  Daughter present at bedside.

## 2022-11-12 NOTE — Death Summary Note (Signed)
DEATH SUMMARY   Patient Details  Name: Mark Cabrera MRN: [id number] DOB: 07-08-1950  Admission/Discharge Information   Admit Date:  23-Oct-2022  Date of Death: Date of Death: October 26, 2022  Time of Death: Time of Death: 0647  Length of Stay: 3  Referring Physician: Colbert Coyer, Alyson Locket, MD   Reason(s) for Hospitalization  72 y.o. male with medical history significant of CHF, atrial fibrillation, coronary artery disease status post two-vessel CABG, depression, dementia with behavioral disturbance, GERD, heart murmur, hyperlipidemia, hypertension, stroke, transaminitis, and more presents the ED with a chief complaint of fall.  Patient had an unwitnessed fall at [facility].  EMS was called.  Patient is on blood thinners.  Patient was admitted with hypothermia/positive SIRS criteria with no obvious infection noted.  He does have rib fractures as well and happens to be on Eliquis for atrial fibrillation.  According to daughter at bedside, he appears to be overall declining and having more frequent falls and is not eating or drinking very well.  Palliative consulted for consideration of hospice.  Continues to remain on IV antibiotics.   Diagnoses  Preliminary cause of death: HFrEF ischemic cardiomyopathy Secondary Diagnoses (including complications and co-morbidities):  Principal Problem:   Hypothermia Active Problems:   Diabetes mellitus with neurological manifestation (HCC)   Dyslipidemia   Paroxysmal atrial fibrillation (HCC)   Acute kidney injury (HCC)   HFrEF (heart failure with reduced ejection fraction) (HCC)   Dementia with behavioral disturbance (HCC)   Rib fractures   Fall   Dehydration   SIRS (systemic inflammatory response syndrome) [facility])  Brief Hospital Course (including significant findings, care, treatment, and services provided and events leading to death)  72 year old male with ischemic cardiomyopathy EF 30 to 35%, presented after unwitnessed fall at [facility] with  subsequent rib fractures noted to be hypothermic on arrival with SIRS criteria and progressive decline.  Palliative medicine service was consulted and met with family and decision was made to make him full comfort care.  Patient was deemed too unstable to transfer to the residential hospice facility subsequently was cared for in the hospital and expired 10/26/2022 and pronounced at 0647.  Pertinent Labs and Studies  Significant Diagnostic Studies CT CHEST ABDOMEN PELVIS W CONTRAST  Result Date: 2022-10-23 CLINICAL DATA:  Status post fall. EXAM: CT CHEST, ABDOMEN, AND PELVIS WITH CONTRAST TECHNIQUE: Multidetector CT imaging of the chest, abdomen and pelvis was performed following the standard protocol during bolus administration of intravenous contrast. RADIATION DOSE REDUCTION: This exam was performed according to the departmental dose-optimization program which includes automated exposure control, adjustment of the mA and/or kV according to patient size and/or use of iterative reconstruction technique. CONTRAST:  80mL OMNIPAQUE IOHEXOL 300 MG/ML  SOLN COMPARISON:  February 27, 2022 FINDINGS: CT CHEST FINDINGS Cardiovascular: There is moderate to marked severity calcification of the aortic arch, without evidence of aortic aneurysm. There is mild cardiomegaly with marked severity coronary artery calcification. 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 A metallic density atrial appendage clip is also noted. No pericardial effusion. Mediastinum/Nodes: No enlarged mediastinal, hilar, or axillary lymph nodes. Thyroid gland, trachea, and esophagus demonstrate no significant findings. Lungs/Pleura: Mild posterior left upper lobe and mild bilateral lower lobe atelectasis is seen. There is a large left pleural effusion. No pneumothorax is identified. Musculoskeletal: Acute posterior tenth, eleventh and twelfth left rib fractures are seen. CT ABDOMEN PELVIS FINDINGS Hepatobiliary: The liver is cirrhotic in appearance. The  gallbladder is partially contracted, without evidence of gallstones, gallbladder wall thickening or  pericholecystic inflammation. Pancreas: Unremarkable. No pancreatic ductal dilatation or surrounding inflammatory changes. Spleen: Normal in size without focal abnormality. Adrenals/Urinary Tract: Adrenal glands are unremarkable. Kidneys are normal, without renal calculi, focal lesion, or hydronephrosis. Bladder is unremarkable. Stomach/Bowel: Stomach is within normal limits. The appendix is not clearly identified. A moderate to large amount of stool is seen within the distal sigmoid colon and rectum. No evidence of bowel wall thickening, distention, or inflammatory changes. Vascular/Lymphatic: Aortic atherosclerosis. No enlarged abdominal or pelvic lymph nodes. Reproductive: There is mild to moderate severity prostatomegaly. Other: A 3.0 cm x 2.0 cm fat containing left inguinal hernia is seen. No abdominopelvic ascites. Musculoskeletal: A fracture deformity of indeterminate age is seen involving the left lateral transverse process of L1. Bilateral pars defects are seen at the level of L5. Very small, linear, nondisplaced cortical irregularities of indeterminate age are seen along the anterior aspect of the lower sacrum. Multilevel degenerative changes seen throughout the lumbar spine IMPRESSION: 1. Acute posterior tenth, eleventh and twelfth left rib fractures. 2. Large left pleural effusion. 3. Mild posterior left upper lobe and mild bilateral lower lobe atelectasis. 4. Cirrhotic liver. 5. Moderate to large amount of stool within the distal sigmoid colon and rectum. 6. 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 7. Fracture deformity of indeterminate age involving the left lateral transverse process of L1. 8. Aortic atherosclerosis. Aortic Atherosclerosis (ICD10-I70.0). Electronically Signed   By: Aram Candela M.D.   On: 10/06/2022 16:48   CT Cervical Spine Wo Contrast  Result Date: 10/03/2022 CLINICAL DATA:   Status post fall. EXAM: CT CERVICAL SPINE WITHOUT CONTRAST TECHNIQUE: Multidetector CT imaging of the cervical spine was performed without intravenous contrast. Multiplanar CT image reconstructions were also generated. RADIATION DOSE REDUCTION: This exam was performed according to the departmental dose-optimization program which includes automated exposure control, adjustment of the mA and/or kV according to patient size and/or use of iterative reconstruction technique. COMPARISON:  November 08, 2020 FINDINGS: Alignment: Normal. Skull base and vertebrae: No acute fracture. No primary bone lesion or focal pathologic process. Soft tissues and spinal canal: No prevertebral fluid or swelling. No visible canal hematoma. Disc levels: Mild multilevel endplate sclerosis is seen throughout all levels of the cervical spine. Moderate severity endplate sclerosis and mild anterior osteophyte formation are also seen at the level of C7-T1. Moderate severity posterior bony spurring is seen at C4-C5. There is mild multilevel intervertebral disc space narrowing. Bilateral mild-to-moderate severity multilevel facet joint hypertrophy is noted. Upper chest: A large pleural effusion is seen within the left apex. Other: It should be noted that the study is limited secondary to patient motion. IMPRESSION: 1. Limited study secondary to patient motion. 2. No evidence of an acute fracture or subluxation. 3. Mild to moderate severity multilevel degenerative changes, as described above. 4. Large left pleural effusion. Electronically Signed   By: Aram Candela M.D.   On: 09/24/2022 16:34   CT Head Wo Contrast  Result Date: 10/08/2022 CLINICAL DATA:  Unwitnessed fall. EXAM: CT HEAD WITHOUT CONTRAST TECHNIQUE: Contiguous axial images were obtained from the base of the skull through the vertex without intravenous contrast. RADIATION DOSE REDUCTION: This exam was performed according to the departmental dose-optimization program which includes  automated exposure control, adjustment of the mA and/or kV according to patient size and/or use of iterative reconstruction technique. COMPARISON:  Jul 29, 2022 FINDINGS: Brain: There is mild to moderate severity cerebral atrophy with widening of the extra-axial spaces and ventricular dilatation. There are areas of  decreased attenuation within the white matter tracts of the supratentorial brain, consistent with microvascular disease changes. There is a small, chronic left occipital lobe infarct. Vascular: No hyperdense vessel or unexpected calcification. Skull: Normal. Negative for fracture or focal lesion. Sinuses/Orbits: Postoperative changes are seen involving globe. Other: None. IMPRESSION: 1. Generalized cerebral atrophy with chronic white matter small vessel ischemic changes. 2. Small, chronic left occipital lobe infarct. 3. No acute intracranial abnormality. Electronically Signed   By: Aram Candela M.D.   On: 09/25/2022 16:30   DG Pelvis Portable  Result Date: 10/06/2022 CLINICAL DATA:  fall pain EXAM: PORTABLE PELVIS 1-2 VIEWS COMPARISON:  07/29/2022. FINDINGS: Pelvis is intact with normal and symmetric sacroiliac joints. No acute fracture or dislocation. No aggressive osseous lesion. Visualized sacral arcuate lines are unremarkable. Unremarkable symphysis pubis. There are mild degenerative changes of bilateral hip joints without significant joint space narrowing. Osteophytosis of the superior acetabulum. No radiopaque foreign bodies. IMPRESSION: No acute fracture or dislocation. Mild degenerative changes of bilateral hip joints. Electronically Signed   By: Jules Schick M.D.   On: 10/08/2022 14:04   DG Chest Port 1 View  Result Date: 09/24/2022 CLINICAL DATA:  fall pain EXAM: PORTABLE CHEST 1 VIEW COMPARISON:  08/09/2022. FINDINGS: Low lung volume. Mild diffuse interstitial prominence. It is uncertain whether this represents artifactual "crowding" of the interstitium related to suboptimal  inspiration versus pulmonary vascular congestion. Redemonstration of left retrocardiac airspace opacity obscuring the left hemidiaphragm, descending thoracic aorta and blunting the left lateral costophrenic angle suggesting combination of left lower lobe atelectasis and/or consolidation with pleural effusion. No significant interval change since the prior study. Bilateral lungs are otherwise clear. Right lateral costophrenic angle is clear. Stable mildly enlarged cardio-mediastinal silhouette. No acute osseous abnormalities. The soft tissues are within normal limits. IMPRESSION: *Left lower lobe atelectasis and/or consolidation with pleural effusion. *Mild diffuse interstitial prominence. It is uncertain whether this represents artifactual "crowding" of the interstitium related to suboptimal inspiration versus pulmonary vascular congestion. Electronically Signed   By: Jules Schick M.D.   On: 10/08/2022 14:03    Microbiology Recent Results (from the past 240 hour(s))  Culture, blood (routine x 2)     Status: None (Preliminary result)   Collection Time: 09/11/2022  3:09 PM   Specimen: BLOOD  Result Value Ref Range Status   Specimen Description BLOOD BLOOD LEFT HAND  Final   Special Requests NONE  Final   Culture   Final    NO GROWTH 2 DAYS Performed at [facility], [address] [place], [place] [address]    Report Status PENDING  Incomplete  Culture, blood (routine x 2)     Status: None (Preliminary result)   Collection Time: 09/27/2022  3:09 PM   Specimen: BLOOD  Result Value Ref Range Status   Specimen Description BLOOD BLOOD LEFT HAND LEFT LOWER HAND  Final   Special Requests   Final    BOTTLES DRAWN AEROBIC AND ANAEROBIC Blood Culture results may not be optimal due to an inadequate volume of blood received in culture bottles   Culture   Final    NO GROWTH 2 DAYS Performed at [facility], [address] [place], [place] [address]    Report Status PENDING  Incomplete  SARS Coronavirus 2  by RT PCR (hospital order, performed in [facility] hospital lab) *cepheid single result test* Anterior Nasal Swab     Status: None   Collection Time: 10/10/2022  3:12 PM   Specimen: Anterior Nasal Swab  Result Value Ref Range Status   SARS Coronavirus 2 by RT PCR NEGATIVE NEGATIVE Final    Comment: (NOTE) SARS-CoV-2 target nucleic acids are NOT DETECTED.  The SARS-CoV-2 RNA is generally detectable in upper and lower respiratory specimens during the acute phase of infection. The lowest concentration of SARS-CoV-2 viral copies this assay can detect is 250 copies / mL. A negative result does not preclude SARS-CoV-2 infection and should not be used as the sole basis for treatment or other patient management decisions.  A negative result may occur with improper specimen collection / handling, submission of specimen other than nasopharyngeal swab, presence of viral mutation(s) within the areas targeted by this assay, and inadequate number of viral copies (<250 copies / mL). A negative result must be combined with clinical observations, patient history, and epidemiological information.  Fact Sheet for Patients:   RoadLapTop.co.za  Fact Sheet for Healthcare Providers: http://kim-miller.com/  This test is not yet approved or  cleared by the [place] FDA and has been authorized for detection and/or diagnosis of SARS-CoV-2 by FDA under an Emergency Use Authorization (EUA).  This EUA will remain in effect (meaning this test can be used) for the duration of the COVID-19 declaration under Section 564(b)(1) of the Act, 21 U.S.C. section 360bbb-3(b)(1), unless the authorization is terminated or revoked sooner.  Performed at [facility], [address], [place], [place] [address]   MRSA Next Gen by PCR, Nasal     Status: Abnormal   Collection Time: 10/08/2022  5:48 PM   Specimen: Nasal Mucosa; Nasal Swab  Result Value Ref Range Status   MRSA by PCR  Next Gen DETECTED (A) NOT DETECTED Final    Comment: RESULT CALLED TO, READ BACK BY AND VERIFIED WITH: CODY KINLEY AT 0019 10/10/2022 BY T KENNEDY (NOTE) The GeneXpert MRSA Assay (FDA approved for NASAL specimens only), is one component of a comprehensive MRSA colonization surveillance program. It is not intended to diagnose MRSA infection nor to guide or monitor treatment for MRSA infections. Test performance is not FDA approved in patients less than 59 years old. Performed at [facility], [address] [place], [place] [address]     Lab Basic Metabolic Panel: Recent Labs  Lab 10/08/2022 1250 10/10/22 0513  NA 132* 138  K 3.1* 4.0  CL 98 109  CO2 20* 20*  GLUCOSE 191* 136*  BUN 73* 58*  CREATININE 1.53* 1.43*  CALCIUM 8.4* 8.1*  MG  --  2.0   Liver Function Tests: Recent Labs  Lab 09/28/2022 1250 10/10/22 0513  AST 58* 50*  ALT 44 38  ALKPHOS 88 77  BILITOT 2.6* 1.4*  PROT 8.0 7.0  ALBUMIN 3.4* 2.9*   No results for input(s): "LIPASE", "AMYLASE" in the last 168 hours. No results for input(s): "AMMONIA" in the last 168 hours. CBC: Recent Labs  Lab 09/14/2022 1250 10/10/22 0721  WBC 3.1* 4.8  NEUTROABS  --  3.5  HGB 10.7* 10.0*  HCT 32.5* 31.2*  MCV 94.5 96.3  PLT 226 225   Cardiac Enzymes: No results for input(s): "CKTOTAL", "CKMB", "CKMBINDEX", "TROPONINI" in the last 168 hours. Sepsis Labs: Recent Labs  Lab 10/03/2022 1250 09/13/2022 1509 09/22/2022 1634 10/03/2022 1953 09/24/2022 2244 10/10/22 0721  PROCALCITON <0.10  --   --   --   --   --   WBC 3.1*  --   --   --   --  4.8  LATICACIDVEN  --  2.1* 2.4* 2.4* 2.0*  --  Procedures/Operations   Mando Blatz MD 10/18/2022, 7:40 AM

## 2022-11-12 DEATH — deceased

## 2023-05-29 ENCOUNTER — Ambulatory Visit: Payer: Medicare Other | Admitting: Neurology

## 2023-05-30 ENCOUNTER — Ambulatory Visit: Payer: Medicare Other | Admitting: Neurology
